# Patient Record
Sex: Male | Born: 1945
Health system: Southern US, Community
[De-identification: ages and names within clinical notes are randomized; demographics above are authoritative.]

## PROBLEM LIST (undated history)

## (undated) DIAGNOSIS — K8020 Calculus of gallbladder without cholecystitis without obstruction: Secondary | ICD-10-CM

## (undated) DIAGNOSIS — E785 Hyperlipidemia, unspecified: Secondary | ICD-10-CM

## (undated) DIAGNOSIS — I517 Cardiomegaly: Secondary | ICD-10-CM

## (undated) DIAGNOSIS — N183 Chronic kidney disease, stage 3 unspecified: Secondary | ICD-10-CM

## (undated) DIAGNOSIS — S7290XA Unspecified fracture of unspecified femur, initial encounter for closed fracture: Secondary | ICD-10-CM

## (undated) DIAGNOSIS — R9389 Abnormal findings on diagnostic imaging of other specified body structures: Secondary | ICD-10-CM

## (undated) DIAGNOSIS — A159 Respiratory tuberculosis unspecified: Secondary | ICD-10-CM

## (undated) DIAGNOSIS — J4489 Other specified chronic obstructive pulmonary disease: Secondary | ICD-10-CM

## (undated) DIAGNOSIS — Z111 Encounter for screening for respiratory tuberculosis: Secondary | ICD-10-CM

## (undated) DIAGNOSIS — E1129 Type 2 diabetes mellitus with other diabetic kidney complication: Secondary | ICD-10-CM

## (undated) DIAGNOSIS — N401 Enlarged prostate with lower urinary tract symptoms: Secondary | ICD-10-CM

## (undated) DIAGNOSIS — E669 Obesity, unspecified: Secondary | ICD-10-CM

## (undated) DIAGNOSIS — I10 Essential (primary) hypertension: Secondary | ICD-10-CM

## (undated) DIAGNOSIS — Z550 Illiteracy and low-level literacy: Secondary | ICD-10-CM

## (undated) DIAGNOSIS — Z20828 Contact with and (suspected) exposure to other viral communicable diseases: Secondary | ICD-10-CM

## (undated) DIAGNOSIS — E1165 Type 2 diabetes mellitus with hyperglycemia: Secondary | ICD-10-CM

## (undated) DIAGNOSIS — F17200 Nicotine dependence, unspecified, uncomplicated: Secondary | ICD-10-CM

## (undated) DIAGNOSIS — I639 Cerebral infarction, unspecified: Secondary | ICD-10-CM

## (undated) DIAGNOSIS — J449 Chronic obstructive pulmonary disease, unspecified: Secondary | ICD-10-CM

## (undated) HISTORY — DX: Cerebral infarction, unspecified: I63.9

## (undated) HISTORY — DX: Other specified chronic obstructive pulmonary disease: J44.89

## (undated) HISTORY — DX: Calculus of gallbladder without cholecystitis without obstruction: K80.20

## (undated) HISTORY — DX: Contact with and (suspected) exposure to other viral communicable diseases: Z20.828

## (undated) HISTORY — DX: Abnormal findings on diagnostic imaging of other specified body structures: R93.89

## (undated) HISTORY — DX: Illiteracy and low-level literacy: Z55.0

## (undated) HISTORY — DX: Encounter for screening for respiratory tuberculosis: Z11.1

## (undated) HISTORY — DX: Chronic kidney disease, stage 3 unspecified: N18.30

## (undated) HISTORY — DX: Chronic obstructive pulmonary disease, unspecified: J44.9

## (undated) HISTORY — DX: Essential (primary) hypertension: I10

## (undated) HISTORY — DX: Hyperlipidemia, unspecified: E78.5

## (undated) HISTORY — DX: Unspecified fracture of unspecified femur, initial encounter for closed fracture: S72.90XA

## (undated) HISTORY — DX: Respiratory tuberculosis unspecified: A15.9

## (undated) HISTORY — DX: Benign prostatic hyperplasia with lower urinary tract symptoms: N40.1

## (undated) HISTORY — DX: Type 2 diabetes mellitus with hyperglycemia: E11.29

## (undated) HISTORY — DX: Obesity, unspecified: E66.9

## (undated) HISTORY — DX: Type 2 diabetes mellitus with other diabetic kidney complication: E11.65

## (undated) HISTORY — DX: Chronic kidney disease, stage 3 (moderate): N18.3

## (undated) HISTORY — DX: Cardiomegaly: I51.7

## (undated) HISTORY — DX: Type 2 diabetes mellitus with other diabetic kidney complication: E11.29

## (undated) HISTORY — DX: Nicotine dependence, unspecified, uncomplicated: F17.200

---
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
## 2007-10-06 DIAGNOSIS — R9389 Abnormal findings on diagnostic imaging of other specified body structures: Secondary | ICD-10-CM

## 2007-10-06 DIAGNOSIS — I639 Cerebral infarction, unspecified: Secondary | ICD-10-CM

## 2007-10-06 HISTORY — DX: Abnormal findings on diagnostic imaging of other specified body structures: R93.89

## 2007-10-06 HISTORY — DX: Cerebral infarction, unspecified: I63.9

## 2008-02-16 ENCOUNTER — Ambulatory Visit: Payer: Self-pay | Admitting: Internal Medicine

## 2008-02-16 ENCOUNTER — Inpatient Hospital Stay (HOSPITAL_COMMUNITY): Admission: EM | Admit: 2008-02-16 | Discharge: 2008-02-18 | Payer: Self-pay | Admitting: Family Medicine

## 2008-02-17 ENCOUNTER — Encounter (INDEPENDENT_AMBULATORY_CARE_PROVIDER_SITE_OTHER): Payer: Self-pay | Admitting: *Deleted

## 2008-02-28 ENCOUNTER — Ambulatory Visit: Payer: Self-pay | Admitting: Family Medicine

## 2008-02-28 ENCOUNTER — Ambulatory Visit: Payer: Self-pay | Admitting: *Deleted

## 2008-03-15 ENCOUNTER — Ambulatory Visit: Payer: Self-pay | Admitting: Family Medicine

## 2008-03-15 ENCOUNTER — Encounter (INDEPENDENT_AMBULATORY_CARE_PROVIDER_SITE_OTHER): Payer: Self-pay | Admitting: Nurse Practitioner

## 2008-03-15 LAB — CONVERTED CEMR LAB
Albumin: 4.3 g/dL (ref 3.5–5.2)
Alkaline Phosphatase: 70 units/L (ref 39–117)
BUN: 36 mg/dL — ABNORMAL HIGH (ref 6–23)
CO2: 19 meq/L (ref 19–32)
Calcium: 9.2 mg/dL (ref 8.4–10.5)
Chloride: 103 meq/L (ref 96–112)
Glucose, Bld: 70 mg/dL (ref 70–99)
Potassium: 4.6 meq/L (ref 3.5–5.3)
Sodium: 139 meq/L (ref 135–145)
Total Protein: 7.6 g/dL (ref 6.0–8.3)

## 2008-03-20 ENCOUNTER — Ambulatory Visit (HOSPITAL_COMMUNITY): Admission: RE | Admit: 2008-03-20 | Discharge: 2008-03-20 | Payer: Self-pay | Admitting: Family Medicine

## 2008-05-29 ENCOUNTER — Ambulatory Visit: Payer: Self-pay | Admitting: Internal Medicine

## 2008-06-06 ENCOUNTER — Ambulatory Visit: Payer: Self-pay | Admitting: Internal Medicine

## 2008-06-12 ENCOUNTER — Ambulatory Visit (HOSPITAL_COMMUNITY): Admission: RE | Admit: 2008-06-12 | Discharge: 2008-06-12 | Payer: Self-pay | Admitting: Internal Medicine

## 2008-07-25 ENCOUNTER — Ambulatory Visit: Payer: Self-pay | Admitting: Internal Medicine

## 2008-07-25 ENCOUNTER — Encounter (INDEPENDENT_AMBULATORY_CARE_PROVIDER_SITE_OTHER): Payer: Self-pay | Admitting: Internal Medicine

## 2008-07-25 LAB — CONVERTED CEMR LAB
ALT: 18 units/L (ref 0–53)
AST: 22 units/L (ref 0–37)
Albumin: 4.6 g/dL (ref 3.5–5.2)
Amphetamine Screen, Ur: NEGATIVE
BUN: 30 mg/dL — ABNORMAL HIGH (ref 6–23)
Barbiturate Quant, Ur: NEGATIVE
Basophils Relative: 2 % — ABNORMAL HIGH (ref 0–1)
CO2: 22 meq/L (ref 19–32)
Calcium: 9.3 mg/dL (ref 8.4–10.5)
Chloride: 105 meq/L (ref 96–112)
Creatinine, Ser: 1.39 mg/dL (ref 0.40–1.50)
Hemoglobin: 14.8 g/dL (ref 13.0–17.0)
Lymphocytes Relative: 35 % (ref 12–46)
MCHC: 33 g/dL (ref 30.0–36.0)
Marijuana Metabolite: POSITIVE — AB
Methadone: NEGATIVE
Monocytes Absolute: 0.7 10*3/uL (ref 0.1–1.0)
Monocytes Relative: 9 % (ref 3–12)
Neutro Abs: 3.7 10*3/uL (ref 1.7–7.7)
Neutrophils Relative %: 46 % (ref 43–77)
Potassium: 4.3 meq/L (ref 3.5–5.3)
Propoxyphene: NEGATIVE
RBC: 4.89 M/uL (ref 4.22–5.81)
TSH: 1.188 microintl units/mL (ref 0.350–4.50)
WBC: 7.9 10*3/uL (ref 4.0–10.5)

## 2008-07-27 ENCOUNTER — Ambulatory Visit (HOSPITAL_COMMUNITY): Admission: RE | Admit: 2008-07-27 | Discharge: 2008-07-27 | Payer: Self-pay | Admitting: Internal Medicine

## 2008-08-08 ENCOUNTER — Ambulatory Visit: Payer: Self-pay | Admitting: Internal Medicine

## 2008-08-27 ENCOUNTER — Ambulatory Visit: Payer: Self-pay | Admitting: Internal Medicine

## 2008-08-28 ENCOUNTER — Ambulatory Visit: Payer: Self-pay | Admitting: Family Medicine

## 2008-09-06 ENCOUNTER — Ambulatory Visit (HOSPITAL_COMMUNITY): Admission: RE | Admit: 2008-09-06 | Discharge: 2008-09-06 | Payer: Self-pay | Admitting: Internal Medicine

## 2008-09-10 ENCOUNTER — Ambulatory Visit: Payer: Self-pay | Admitting: Internal Medicine

## 2008-09-19 ENCOUNTER — Ambulatory Visit: Payer: Self-pay | Admitting: Internal Medicine

## 2008-10-09 ENCOUNTER — Ambulatory Visit: Payer: Self-pay | Admitting: Internal Medicine

## 2008-10-10 ENCOUNTER — Ambulatory Visit: Payer: Self-pay | Admitting: Family Medicine

## 2008-11-12 ENCOUNTER — Ambulatory Visit: Payer: Self-pay | Admitting: Internal Medicine

## 2009-10-21 ENCOUNTER — Encounter: Admission: RE | Admit: 2009-10-21 | Discharge: 2009-10-21 | Payer: Self-pay | Admitting: Internal Medicine

## 2010-08-22 ENCOUNTER — Encounter: Admission: RE | Admit: 2010-08-22 | Discharge: 2010-08-22 | Payer: Self-pay | Admitting: Internal Medicine

## 2010-09-18 ENCOUNTER — Encounter
Admission: RE | Admit: 2010-09-18 | Discharge: 2010-09-18 | Payer: Self-pay | Source: Home / Self Care | Attending: Internal Medicine | Admitting: Internal Medicine

## 2010-10-26 ENCOUNTER — Encounter: Payer: Self-pay | Admitting: Internal Medicine

## 2011-02-17 NOTE — Discharge Summary (Signed)
NAME:  Clarence Maldonado, Clarence Maldonado NO.:  [id number]   MEDICAL RECORD NO.:  [id number]          PATIENT TYPE:  INP   LOCATION:  3702                         FACILITY:  [facility]   PHYSICIAN:  Alvester Morin, M.D.  DATE OF BIRTH:  [birth year]   DATE OF ADMISSION:  02/16/2008  DATE OF DISCHARGE:  02/18/2008                               DISCHARGE SUMMARY   DISCHARGE DIAGNOSES:  1. Hypertensive crisis with dyspnea.  2. Chronic obstructive pulmonary disease.  3. Tobacco abuse.  4. Alcohol abuse.  5. Hyperlipidemia.  6. Pulmonary opacities on chest CT.   DISCHARGE MEDICATIONS:  1. Pravachol 40 mg p.o. daily.  2. Aspirin 81 mg p.o. daily.  3. Norvasc 10 mg p.o. daily.  4. Hydrochlorothiazide 12.5 mg daily.   DISPOSITION:  The patient will follow up at the [facility].  His blood pressure medication will be checked.  Also a BMET  will be checked to follow up with his potassium and his creatinine on a  diuretic.  Also, we will have him see Marcelle Smiling to help him with his  medication. He will also need to have a repeat chest CT in 3-4 months  (see below).   PROCEDURES PERFORMED:  1. 2D echo that showed an ejection fraction of 55-65%, a wall motion      abnormality, mild LVH, and wall thickness increased.  2. CT angio of the chest was negative for PE. However, there were      multiple pulmonary nodular opacities and an ill-defined nodular-      like density in the posteromedial aspect of the right upper lobe.      There was also mediastinal and right hilar adenopathy.   CONSULTANTS:  None.   BRIEF ADMITTING HISTORY AND PHYSICAL:  Mr Campion is a 65 year old man  with past medical history of hypertension, tobacco abuse, and obesity  who comes in because of increased leg swelling and shortness of breath  for the past 64 weeks.  He related 2 months before this, he had no  symptoms of shortness of breath and could walk 2-3 miles without getting  short of breath.  He  can lie flat at night and has been using 3-4  pillows.  He relates no cough, no fever, and no chills.  No nausea,  vomiting, or diarrhea.  He also relates some right-sided chest pain for  2 weeks, like numbness with no radiation.  It is not reproducible by  palpation.  Nothing makes it better and nothing makes it worse.   PHYSICAL EXAMINATION:  VITAL SIGNS:  On the day of admission, his  temperature was 98.1, heart rate was 105, blood pressure was 122/45, and  oxygen saturation was 95% on room air.  GENERAL:  The patient appears in no acute distress.  NECK:  He has positive JVD about 10 cm.  No bruits.  He has been using  accessory muscles.  CARDIOVASCULAR:  He is tachycardic distance with a regular rate and  rhythm.  Normal S1 and S2.  No murmurs, rubs, or gallops.  LUNGS:  He has good air  movement with diffuse wheezing, but no crackles  heard.  EXTREMITIES:  He has positive pulses, 2+ edema, and some perifollicular  hemorrhage.  NEUROLOGIC:  Nonfocal.   LABORATORY DATA:  Admission labs: white blood cells 9.8, hemoglobin  14.9, and platelets 151.  Sodium 140, potassium 4.0, chloride 104,  bicarb 28, BUN 15, creatinine 1.2, glucose 117, bilirubin 0.8, alkaline  phosphatase 58, AST 26, ALT 23, total protein 5.9, albumin 3.2, and  calcium 8.9.  BNP was 793.  D-dimer was 1.11.  Point of cardiac care  markers were negative.  EKG show a sinus tachycardia with a ST-segment  elevation in V1 and V3 with possibly J-point elevation.  No T-wave  abnormality and some left ventricular hypertrophy.   HOSPITAL COURSE:  1. Hypertensive crisis and shortness of breath.  The patient was      admitted to the telemetry unit.  No arrhythmias were seen during      his hospital stay.  CT angio was negative for PE.  The patient was      gently diuresed.  His blood pressure dropped 25% on the first 24      hours.  His systolic pressure ranged between the 160s and 180s.      Cardiac enzymes were negative.   He was discharged on HCTZ and      Norvasc, these will be titrated as an out patient.  2. COPD exacerbation.  This is most likely secondary to smoking.  PFTs      will be done as an outpatient.  The patient has a history of      smoking for more than 40 years.  He was discharged on albuterol      MDI. Other medications may be started as an outpatient.  3. Tobacco abuse.  The patient was counseled at length on smoking      cessation. He states he will attempt to quit.  4. Pulmonary nodular opacities. The patient had a chest CT that showed      multiple pulmonary nodular opacities as well as mediastinal and      right hilar adenopathy. There was also an ill-defined nodular      density in the posteromedial aspect ot the right upper lobe. It was      felt that this could be inflammatory but tumor could not be ruled      out. A CT scan in 6 months was recommended. We will arrange for the      patient to get this follow up in 3-4 months.  5. Social situation. At this time, the patient is unemployed.  We will      schedule him an appointment with Marcelle Smiling in [facility]      to try to help him with his medication.   On the day of discharge, his vitals were; temperature 98.9, pulse 92,  respirations 18, and blood pressure 161/108.  Sodium 139, potassium 4.0,  chloride 95, bicarb of 30, glucose of 93, BUN 16, creatinine 1.1, and  calcium 8.9.  White blood cell 8.7, hemoglobin 16.7, and platelets 165.  Fasting lipid panel showed a cholesterol of 135, triglycerides 275, HDL  27, LDL 93, and VLDL 15.      Marinda Elk, M.D.  Electronically Signed      Alvester Morin, M.D.  Electronically Signed    AF/MEDQ  D:  02/18/2008  T:  02/18/2008  Job:  [id number]

## 2011-07-01 LAB — BASIC METABOLIC PANEL
BUN: 16
CO2: 30
CO2: 32
Calcium: 8.3 — ABNORMAL LOW
Chloride: 95 — ABNORMAL LOW
Creatinine, Ser: 1.29
GFR calc non Af Amer: 57 — ABNORMAL LOW
Glucose, Bld: 100 — ABNORMAL HIGH
Glucose, Bld: 93
Potassium: 4

## 2011-07-01 LAB — CBC
HCT: 49.8
MCHC: 33.6
MCHC: 33.7
MCV: 89
Platelets: 158
Platelets: 165
RDW: 14.9
WBC: 8.7

## 2011-07-01 LAB — LIPID PANEL
Cholesterol: 135
LDL Cholesterol: 93
VLDL: 15

## 2011-09-07 ENCOUNTER — Other Ambulatory Visit (HOSPITAL_BASED_OUTPATIENT_CLINIC_OR_DEPARTMENT_OTHER): Payer: Self-pay | Admitting: Internal Medicine

## 2011-09-07 ENCOUNTER — Ambulatory Visit
Admission: RE | Admit: 2011-09-07 | Discharge: 2011-09-07 | Disposition: A | Discharge status: Home / Self Care | Payer: Medicare Other | Source: Ambulatory Visit | Attending: Internal Medicine | Admitting: Internal Medicine

## 2011-09-07 DIAGNOSIS — J984 Other disorders of lung: Secondary | ICD-10-CM

## 2012-01-06 DIAGNOSIS — E785 Hyperlipidemia, unspecified: Secondary | ICD-10-CM | POA: Diagnosis not present

## 2012-01-11 DIAGNOSIS — I10 Essential (primary) hypertension: Secondary | ICD-10-CM | POA: Diagnosis not present

## 2012-01-11 DIAGNOSIS — E785 Hyperlipidemia, unspecified: Secondary | ICD-10-CM | POA: Diagnosis not present

## 2012-01-11 DIAGNOSIS — Z23 Encounter for immunization: Secondary | ICD-10-CM | POA: Diagnosis not present

## 2012-03-07 DIAGNOSIS — I10 Essential (primary) hypertension: Secondary | ICD-10-CM | POA: Diagnosis not present

## 2012-03-07 DIAGNOSIS — E1129 Type 2 diabetes mellitus with other diabetic kidney complication: Secondary | ICD-10-CM | POA: Diagnosis not present

## 2012-06-13 DIAGNOSIS — R634 Abnormal weight loss: Secondary | ICD-10-CM | POA: Diagnosis not present

## 2012-06-13 DIAGNOSIS — J449 Chronic obstructive pulmonary disease, unspecified: Secondary | ICD-10-CM | POA: Diagnosis not present

## 2012-07-18 DIAGNOSIS — I10 Essential (primary) hypertension: Secondary | ICD-10-CM | POA: Diagnosis not present

## 2012-07-18 DIAGNOSIS — N189 Chronic kidney disease, unspecified: Secondary | ICD-10-CM | POA: Diagnosis not present

## 2012-08-22 DIAGNOSIS — I10 Essential (primary) hypertension: Secondary | ICD-10-CM | POA: Diagnosis not present

## 2013-01-04 ENCOUNTER — Encounter: Payer: Self-pay | Admitting: Internal Medicine

## 2013-01-04 ENCOUNTER — Ambulatory Visit (INDEPENDENT_AMBULATORY_CARE_PROVIDER_SITE_OTHER): Payer: Medicare Other | Admitting: Internal Medicine

## 2013-01-04 VITALS — BP 120/82 | HR 90 | Temp 98.0°F | Resp 20 | Wt 198.0 lb

## 2013-01-04 DIAGNOSIS — F17200 Nicotine dependence, unspecified, uncomplicated: Secondary | ICD-10-CM | POA: Diagnosis not present

## 2013-01-04 DIAGNOSIS — E1129 Type 2 diabetes mellitus with other diabetic kidney complication: Secondary | ICD-10-CM

## 2013-01-04 DIAGNOSIS — N401 Enlarged prostate with lower urinary tract symptoms: Secondary | ICD-10-CM | POA: Diagnosis not present

## 2013-01-04 DIAGNOSIS — E1165 Type 2 diabetes mellitus with hyperglycemia: Secondary | ICD-10-CM

## 2013-01-04 DIAGNOSIS — N400 Enlarged prostate without lower urinary tract symptoms: Secondary | ICD-10-CM | POA: Insufficient documentation

## 2013-01-04 DIAGNOSIS — R300 Dysuria: Secondary | ICD-10-CM

## 2013-01-04 DIAGNOSIS — I10 Essential (primary) hypertension: Secondary | ICD-10-CM | POA: Diagnosis not present

## 2013-01-04 DIAGNOSIS — N183 Chronic kidney disease, stage 3 unspecified: Secondary | ICD-10-CM

## 2013-01-04 DIAGNOSIS — E785 Hyperlipidemia, unspecified: Secondary | ICD-10-CM | POA: Insufficient documentation

## 2013-01-04 NOTE — Patient Instructions (Signed)
Continue current medications. 

## 2013-01-05 ENCOUNTER — Encounter: Payer: Self-pay | Admitting: Internal Medicine

## 2013-01-05 ENCOUNTER — Telehealth: Payer: Self-pay | Admitting: *Deleted

## 2013-01-05 DIAGNOSIS — N183 Chronic kidney disease, stage 3 unspecified: Secondary | ICD-10-CM | POA: Insufficient documentation

## 2013-01-05 DIAGNOSIS — E1122 Type 2 diabetes mellitus with diabetic chronic kidney disease: Secondary | ICD-10-CM | POA: Insufficient documentation

## 2013-01-05 DIAGNOSIS — R300 Dysuria: Secondary | ICD-10-CM | POA: Insufficient documentation

## 2013-01-05 LAB — LIPID PANEL
Chol/HDL Ratio: 6.6 ratio units — ABNORMAL HIGH (ref 0.0–5.0)
Cholesterol, Total: 179 mg/dL (ref 100–199)
LDL Calculated: 87 mg/dL (ref 0–99)

## 2013-01-05 LAB — MICROALBUMIN / CREATININE URINE RATIO
Creatinine, Ur: 212.8 mg/dL (ref 22.0–328.0)
Microalbumin, Urine: 8.9 ug/mL (ref 0.0–17.0)

## 2013-01-05 LAB — COMPREHENSIVE METABOLIC PANEL
ALT: 19 IU/L (ref 0–44)
AST: 21 IU/L (ref 0–40)
Albumin/Globulin Ratio: 1.4 (ref 1.1–2.5)
Calcium: 10 mg/dL (ref 8.6–10.2)
GFR calc non Af Amer: 31 mL/min/{1.73_m2} — ABNORMAL LOW (ref >59)
Potassium: 4.4 mmol/L (ref 3.5–5.2)
Sodium: 140 mmol/L (ref 134–144)
Total Bilirubin: 0.2 mg/dL (ref 0.0–1.2)

## 2013-01-05 NOTE — Progress Notes (Signed)
Subjective:     Patient ID: Clarence Maldonado, male   DOB: [birth year], 67 y.o.   MRN: [id number]  HPI Patient reports today for general review of multiple medical problems. Is diabetic, hypertensive, has renal insufficiency, chronic obstructive pulmonary disease, hyperlipidemia, and is a persistent smoker. He is not likely to quit smoking, although he has cut back from nearly 3 packs per day to about one half pack per day. He has no real complaints today.  Patient is illiterate and requires the assistance of others to keep track of his mail, medications, and other items. He does drive. He has had a license since he was a teenager and it has no restrictions on it. He lives in a trailer behind a friend's house.  Review of medical records indicates that he has had a number of CT chest scans due to the abnormalities of a nodule. This seemed to be unchanging with the last scan and he probably does not need further CT scans unless evidence is seen of changes on her plain x-ray or in symptomatology. He is not losing weight. He does have a chronic cough related to his smoking history. He has had no significant increase in sputum. He denies hemoptysis.  Past procedures 5 /15/2009 2-D echocardiogram: LVEF 55-65%. Aortic valve thickness mildly increased. Trivial aortic valvular regurgitation. Mild ascending aortic dilatation. Mild mitral valve regurgitation. 09/07/11 CT chest without contrast: This was compared to CT of the chest of 09/18/10, 06/12/08, and 02/16/08. There is a groundglass opacity within the posterior right upper lobe which has not changed. A nodular component is posterior lesion within this groundglass opacity and it is slightly larger measuring 9 mm in diameter compared to proximally 6 mm previously. It remains very worrisome for low-grade adenocarcinoma. PET scan should be considered. Probable small  gallstones in the gallbladder. 08/22/10 Renal ultrasound: Mild bilateral renal cortical thinning. No  acute findings.   10/21/09 CXR: Stable chronic changes with no active pulmonary disease   Review of Systems  Constitutional: Negative for fever, chills, weight loss, malaise/fatigue and diaphoresis.  HENT: Positive for hearing loss. Negative for ear pain, nosebleeds, congestion, sore throat, neck pain, tinnitus and ear discharge.   Eyes: Negative for blurred vision, double vision, photophobia, pain and discharge.  Respiratory: Positive for cough, shortness of breath and wheezing. Negative for hemoptysis, sputum production and stridor.   Cardiovascular: Negative for chest pain, palpitations, orthopnea, claudication, leg swelling and PND.  Gastrointestinal: Negative for heartburn, vomiting, abdominal pain, diarrhea, constipation, blood in stool and melena.  Endocrine: Negative for polydipsia.  Genitourinary: Negative for dysuria, urgency, frequency, hematuria and flank pain.       Diminished urinary stream and slow to initiate voiding.  Musculoskeletal: Negative for myalgias, back pain, joint pain and falls.  Skin: Negative for itching and rash.  Allergic/Immunologic: Negative for environmental allergies.  Neurological: Negative for dizziness, tingling, tremors, sensory change, speech change, focal weakness, seizures, loss of consciousness, weakness and headaches.  Hematological: Does not bruise/bleed easily.  Psychiatric/Behavioral: Positive for depression. Negative for suicidal ideas, hallucinations, memory loss and substance abuse. The patient is not nervous/anxious and does not have insomnia.    Review of Systems  Constitutional: Negative for fever, chills, weight loss, malaise/fatigue and diaphoresis.  HENT: Positive for hearing loss. Negative for ear pain, nosebleeds, congestion, sore throat, neck pain, tinnitus and ear discharge.   Eyes: Negative for blurred vision, double vision, photophobia, pain and discharge.  Respiratory: Positive for cough, shortness of breath and wheezing.  Negative  for hemoptysis, sputum production and stridor.   Cardiovascular: Negative for chest pain, palpitations, orthopnea, claudication, leg swelling and PND.  Gastrointestinal: Negative for heartburn, vomiting, abdominal pain, diarrhea, constipation, blood in stool and melena.  Genitourinary: Negative for dysuria, urgency, frequency, hematuria and flank pain.       Diminished urinary stream and slow to initiate voiding.  Musculoskeletal: Negative for myalgias, back pain, joint pain and falls.  Skin: Negative for itching and rash.  Neurological: Negative for dizziness, tingling, tremors, sensory change, speech change, focal weakness, seizures, loss of consciousness, weakness and headaches.  Endo/Heme/Allergies: Negative for environmental allergies and polydipsia. Does not bruise/bleed easily.  Psychiatric/Behavioral: Positive for depression. Negative for suicidal ideas, hallucinations, memory loss and substance abuse. The patient is not nervous/anxious and does not have insomnia.       Objective:   Physical Exam  Constitutional: He is oriented to person, place, and time. No distress.  Truncal obesity  HENT:  Head: Normocephalic and atraumatic.  Right Ear: External ear normal.  Left Ear: External ear normal.  Nose: Nose normal.  Mouth/Throat: Oropharynx is clear and moist.  Bilateral hearing loss  Eyes: Conjunctivae and EOM are normal. Pupils are equal, round, and reactive to light. Right eye exhibits no discharge. Left eye exhibits no discharge. No scleral icterus.  Wears prescription lenses  Neck: Neck supple. No JVD present. No tracheal deviation present. No thyromegaly present.  Cardiovascular: Normal rate, regular rhythm, normal heart sounds and intact distal pulses.  Exam reveals no gallop and no friction rub.   No murmur heard. Pulmonary/Chest: Effort normal. No respiratory distress. He has no wheezes. He has rales. He exhibits no tenderness.  Abdominal: Soft. Bowel sounds are  normal. He exhibits no distension and no mass. There is no tenderness. There is no rebound and no guarding.  Genitourinary: Rectum normal, prostate normal and penis normal.  Musculoskeletal: Normal range of motion. He exhibits no edema and no tenderness.  Lymphadenopathy:    He has no cervical adenopathy.  Neurological: He is alert and oriented to person, place, and time. He has normal reflexes. No cranial nerve deficit. Coordination normal.  Skin: Skin is warm and dry. He is not diaphoretic.  Psychiatric: He has a normal mood and affect. His behavior is normal. Judgment and thought content normal.       Assessment:     Stable.     Plan:     Continue current medications . Stop smoking.

## 2013-01-05 NOTE — Telephone Encounter (Signed)
Message copied by Oleh Genin on Thu Jan 05, 2013  1:22 PM ------      Message from: Kimber Relic      Created: Thu Jan 05, 2013  9:43 AM      Regarding: lab repoprt       Results of lab are all acceptable ------

## 2013-01-05 NOTE — Telephone Encounter (Signed)
LMOM to return call.

## 2013-01-06 ENCOUNTER — Telehealth: Payer: Self-pay | Admitting: *Deleted

## 2013-01-06 NOTE — Telephone Encounter (Signed)
Message copied by Oleh Genin on Fri Jan 06, 2013  3:30 PM ------      Message from: Kimber Relic      Created: Thu Jan 05, 2013  9:43 AM      Regarding: lab repoprt       Results of lab are all acceptable ------

## 2013-01-06 NOTE — Telephone Encounter (Signed)
Patient Notified

## 2013-01-10 NOTE — Telephone Encounter (Signed)
Patient Notified of results.

## 2013-02-11 ENCOUNTER — Other Ambulatory Visit: Payer: Self-pay | Admitting: Internal Medicine

## 2013-04-11 ENCOUNTER — Encounter: Payer: Self-pay | Admitting: *Deleted

## 2013-04-12 ENCOUNTER — Ambulatory Visit: Payer: Medicare Other | Admitting: Internal Medicine

## 2013-05-22 ENCOUNTER — Other Ambulatory Visit: Payer: Self-pay | Admitting: Internal Medicine

## 2013-06-21 ENCOUNTER — Other Ambulatory Visit: Payer: Self-pay | Admitting: Internal Medicine

## 2013-10-06 ENCOUNTER — Other Ambulatory Visit: Payer: Self-pay | Admitting: *Deleted

## 2013-10-06 MED ORDER — FLUTICASONE-SALMETEROL 250-50 MCG/DOSE IN AEPB: INHALATION_SPRAY | RESPIRATORY_TRACT | Status: DC

## 2013-10-06 MED ORDER — LOVASTATIN 20 MG PO TABS: ORAL_TABLET | ORAL | Status: DC

## 2013-10-06 MED ORDER — GLIMEPIRIDE 1 MG PO TABS: ORAL_TABLET | ORAL | Status: DC

## 2013-10-06 MED ORDER — ALBUTEROL SULFATE HFA 108 (90 BASE) MCG/ACT IN AERS: INHALATION_SPRAY | RESPIRATORY_TRACT | Status: DC

## 2013-10-06 MED ORDER — AMLODIPINE BESYLATE 10 MG PO TABS: ORAL_TABLET | ORAL | Status: DC

## 2013-10-06 MED ORDER — METOPROLOL SUCCINATE ER 25 MG PO TB24: ORAL_TABLET | ORAL | Status: DC

## 2013-10-06 MED ORDER — LISINOPRIL-HYDROCHLOROTHIAZIDE 20-25 MG PO TABS: ORAL_TABLET | ORAL | Status: DC

## 2013-12-05 ENCOUNTER — Ambulatory Visit: Payer: Medicare Other | Admitting: Internal Medicine

## 2013-12-29 ENCOUNTER — Other Ambulatory Visit: Payer: Medicare Other

## 2013-12-29 ENCOUNTER — Other Ambulatory Visit: Payer: Self-pay | Admitting: Internal Medicine

## 2013-12-29 DIAGNOSIS — E785 Hyperlipidemia, unspecified: Secondary | ICD-10-CM

## 2013-12-29 DIAGNOSIS — T887XXA Unspecified adverse effect of drug or medicament, initial encounter: Secondary | ICD-10-CM

## 2013-12-29 DIAGNOSIS — E1165 Type 2 diabetes mellitus with hyperglycemia: Secondary | ICD-10-CM

## 2013-12-29 DIAGNOSIS — I10 Essential (primary) hypertension: Secondary | ICD-10-CM

## 2013-12-29 DIAGNOSIS — E1129 Type 2 diabetes mellitus with other diabetic kidney complication: Secondary | ICD-10-CM

## 2013-12-29 DIAGNOSIS — N401 Enlarged prostate with lower urinary tract symptoms: Secondary | ICD-10-CM | POA: Diagnosis not present

## 2013-12-30 LAB — COMPREHENSIVE METABOLIC PANEL
ALT: 30 IU/L (ref 0–44)
AST: 25 IU/L (ref 0–40)
Albumin/Globulin Ratio: 1.9 (ref 1.1–2.5)
Albumin: 4.6 g/dL (ref 3.6–4.8)
Alkaline Phosphatase: 72 IU/L (ref 39–117)
BILIRUBIN TOTAL: 0.4 mg/dL (ref 0.0–1.2)
BUN/Creatinine Ratio: 15 (ref 10–22)
BUN: 28 mg/dL — AB (ref 8–27)
CO2: 22 mmol/L (ref 18–29)
Calcium: 9.7 mg/dL (ref 8.6–10.2)
Chloride: 99 mmol/L (ref 97–108)
Creatinine, Ser: 1.81 mg/dL — ABNORMAL HIGH (ref 0.76–1.27)
GFR, EST AFRICAN AMERICAN: 44 mL/min/{1.73_m2} — AB (ref >59)
GFR, EST NON AFRICAN AMERICAN: 38 mL/min/{1.73_m2} — AB (ref >59)
GLOBULIN, TOTAL: 2.4 g/dL (ref 1.5–4.5)
GLUCOSE: 89 mg/dL (ref 65–99)
POTASSIUM: 4.5 mmol/L (ref 3.5–5.2)
Sodium: 139 mmol/L (ref 134–144)
Total Protein: 7 g/dL (ref 6.0–8.5)

## 2013-12-30 LAB — LIPID PANEL
Chol/HDL Ratio: 5.8 ratio units — ABNORMAL HIGH (ref 0.0–5.0)
Cholesterol, Total: 179 mg/dL (ref 100–199)
HDL: 31 mg/dL — AB (ref >39)
LDL Calculated: 92 mg/dL (ref 0–99)
Triglycerides: 281 mg/dL — ABNORMAL HIGH (ref 0–149)
VLDL Cholesterol Cal: 56 mg/dL — ABNORMAL HIGH (ref 5–40)

## 2013-12-30 LAB — PSA: PSA: 0.8 ng/mL (ref 0.0–4.0)

## 2013-12-30 LAB — MICROALBUMIN / CREATININE URINE RATIO
Creatinine, Ur: 68.3 mg/dL (ref 22.0–328.0)
MICROALB/CREAT RATIO: 59.2 mg/g{creat} — AB (ref 0.0–30.0)
Microalbumin, Urine: 40.4 ug/mL — ABNORMAL HIGH (ref 0.0–17.0)

## 2013-12-30 LAB — HEMOGLOBIN A1C
Est. average glucose Bld gHb Est-mCnc: 157 mg/dL
Hgb A1c MFr Bld: 7.1 % — ABNORMAL HIGH (ref 4.8–5.6)

## 2014-01-03 ENCOUNTER — Ambulatory Visit (INDEPENDENT_AMBULATORY_CARE_PROVIDER_SITE_OTHER): Payer: Medicare Other | Admitting: Internal Medicine

## 2014-01-03 ENCOUNTER — Encounter: Payer: Self-pay | Admitting: Internal Medicine

## 2014-01-03 VITALS — BP 120/80 | HR 83 | Temp 98.2°F | Resp 20 | Ht 68.0 in | Wt 208.4 lb

## 2014-01-03 DIAGNOSIS — Z550 Illiteracy and low-level literacy: Secondary | ICD-10-CM

## 2014-01-03 DIAGNOSIS — F17200 Nicotine dependence, unspecified, uncomplicated: Secondary | ICD-10-CM

## 2014-01-03 DIAGNOSIS — E1165 Type 2 diabetes mellitus with hyperglycemia: Principal | ICD-10-CM

## 2014-01-03 DIAGNOSIS — I10 Essential (primary) hypertension: Secondary | ICD-10-CM

## 2014-01-03 DIAGNOSIS — N183 Chronic kidney disease, stage 3 unspecified: Secondary | ICD-10-CM | POA: Diagnosis not present

## 2014-01-03 DIAGNOSIS — E785 Hyperlipidemia, unspecified: Secondary | ICD-10-CM | POA: Diagnosis not present

## 2014-01-03 DIAGNOSIS — E1129 Type 2 diabetes mellitus with other diabetic kidney complication: Secondary | ICD-10-CM

## 2014-01-03 DIAGNOSIS — Z559 Problems related to education and literacy, unspecified: Secondary | ICD-10-CM

## 2014-01-03 DIAGNOSIS — N401 Enlarged prostate with lower urinary tract symptoms: Secondary | ICD-10-CM

## 2014-01-03 DIAGNOSIS — R9389 Abnormal findings on diagnostic imaging of other specified body structures: Secondary | ICD-10-CM

## 2014-01-03 NOTE — Patient Instructions (Signed)
Continue current medications. 

## 2014-01-03 NOTE — Progress Notes (Signed)
Patient ID: Clarence Maldonado, male   DOB: Oct 20, 1945, 68 y.o.   MRN: [id number]    Location:    [facility]  Place of Service:  office  PCP: Kimber Relic, MD  Code Status: full  Extended Emergency Contact Information Primary Emergency Contact: Chriss Czar, [place] Home Phone: [phone] Relation: Other  No Known Allergies  Chief Complaint  Patient presents with  . Annual Exam    HPI:  Type II or unspecified type diabetes mellitus with renal manifestations, uncontrolled(250.42) - A1c 7.1; Fasting glucose 86. Adequately controlled  Hypertension: controlled  Other and unspecified hyperlipidemia - controlled  Chronic kidney disease, stage III (moderate): improved  Benign localized hyperplasia of prostate with urinary obstruction and other lower urinary tract symptoms (LUTS)(600.21): mild hesitation and nocturia x 2.  Tobacco use disorder: cutting down. Says he plans to quit.  Daily walks.  Patient is illiterate and requires the assistance of others to keep track of his mail, medications, and other items. He does drive. He has had a license since he was a teenager and it has no restrictions on it. He lives in a trailer behind a friend's house.   Review of medical records indicates that he has had a number of CT chest scans due to the abnormalities of a nodule. This seemed to be unchanging with the last scan and he probably does not need further CT scans unless evidence is seen of changes on her plain x-ray or in symptomatology. He is not losing weight. He does have a chronic cough related to his smoking history. He has had no significant increase in sputum. He denies hemoptysis.     Past Medical History  Diagnosis Date  . Type II or unspecified type diabetes mellitus with renal manifestations, uncontrolled   . Unspecified essential hypertension   . Obesity, unspecified   . Cardiomegaly   . Other and unspecified hyperlipidemia   .  Chronic kidney disease, stage III (moderate)   . Chronic airway obstruction, not elsewhere classified   . Illiterate   . Closed femur fracture 1960  . Cholelithiasis   . Abnormal CT of the chest 2009    nodule 9 x 6 mm RUL  . Cerebrovascular accident (stroke) 2009    lacunar infarct in the left thalamus  . Tobacco use disorder     Past Surgical History  Procedure Laterality Date  . Femur fracture surgery Left 1960    PAST PROCEDURES 5 /15/2009 2-D echocardiogram: LVEF 55-65%. Aortic valve thickness mildly increased. Trivial aortic valvular regurgitation. Mild ascending aortic dilatation. Mild mitral valve regurgitation. 10/21/2009 CXR: NAD. Stable chronic changes 08/22/10 CXR: right suprahilar opacity 08/22/10 US renal: bilateral cortical thinning 12/25/11Ct chest: minimally more prominent ground glass opacity in RUL with slight nodularity.  Gallstones. 09/07/11 CT chest: 1.  Slightly more prominent nodular component of the ground-glass opacity within  the posterior right upper lobe, suspicious for low grade adenocarcinoma.  Consider PET CT.  2.  No mediastinal or hilar adenopathy.  3.  Higher attenuation within the contracted gallbladder may represent small gallstones   Social History: History   Social History  . Marital Status: Single    Spouse Name: N/A    Number of Children: N/A  . Years of Education: N/A   Social History Main Topics  . Smoking status: Current Every Day Smoker -- 0.50 packs/day for 50 years    Types: Cigarettes  . Smokeless tobacco:  None  . Alcohol Use: No  . Drug Use: None  . Sexual Activity: None   Other Topics Concern  . None   Social History Narrative  . None    Family History Family Status  Relation Status Death Age  . Mother Deceased   . Father Deceased   . Sister Alive   . Brother Deceased   . Daughter Alive   . Brother Deceased   . Sister Alive    Family History  Problem Relation Age of Onset  . Heart disease Mother       Medications: Patient's Medications  New Prescriptions   No medications on file  Previous Medications   ALBUTEROL (PROAIR HFA) 108 (90 BASE) MCG/ACT INHALER    inhale 1 to 2 puffs by mouth every 4 to 6 hours if needed for BREATHING   AMLODIPINE (NORVASC) 10 MG TABLET    take 1 tablet by mouth once daily   FLUTICASONE-SALMETEROL (ADVAIR DISKUS) 250-50 MCG/DOSE AEPB    inhale 1 dose by mouth twice a day   GLIMEPIRIDE (AMARYL) 1 MG TABLET    take 1 tablet by mouth once daily for BLOOD SUGAR   LISINOPRIL-HYDROCHLOROTHIAZIDE (PRINZIDE,ZESTORETIC) 20-25 MG PER TABLET    take 1 tablet by mouth every morning   LOVASTATIN (MEVACOR) 20 MG TABLET    take 1 tablet by mouth once daily for cholesterol   METOPROLOL SUCCINATE (TOPROL-XL) 25 MG 24 HR TABLET    take 1/2 tablet by mouth once daily  Modified Medications   No medications on file  Discontinued Medications   No medications on file    Immunization History  Administered Date(s) Administered  . Influenza Split 06/22/2011  . Pneumococcal Polysaccharide-23 01/11/2012  . Tdap 01/11/2012     Review of Systems  Constitutional: Negative for fever, chills, weight loss, malaise/fatigue and diaphoresis.  HENT: Positive for hearing loss. Negative for congestion, ear discharge, ear pain, nosebleeds, sore throat and tinnitus.   Eyes: Negative for blurred vision, double vision, photophobia, pain and discharge.  Respiratory: Positive for cough, shortness of breath and wheezing. Negative for hemoptysis, sputum production and stridor.   Cardiovascular: Negative for chest pain, palpitations, orthopnea, claudication, leg swelling and PND.  Gastrointestinal: Negative for heartburn, vomiting, abdominal pain, diarrhea, constipation, blood in stool and melena.  Endocrine: Negative for polydipsia.  Genitourinary: Negative for dysuria, urgency, frequency, hematuria and flank pain.       Diminished urinary stream and slow to initiate voiding.   Musculoskeletal: Negative for back pain, falls, joint pain, myalgias and neck pain.  Skin: Negative for itching and rash.  Allergic/Immunologic: Negative for environmental allergies.  Neurological: Negative for dizziness, tingling, tremors, sensory change, speech change, focal weakness, seizures, loss of consciousness, weakness and headaches.  Hematological: Does not bruise/bleed easily.  Psychiatric/Behavioral: Positive for depression. Negative for suicidal ideas, hallucinations, memory loss and substance abuse. The patient is not nervous/anxious and does not have insomnia.       Filed Vitals:   01/03/14 1236  BP: 120/80  Pulse: 83  Temp: 98.2 F (36.8 C)  TempSrc: Oral  Resp: 20  Height: 5\' 8"  (1.727 m)  Weight: 208 lb 6.4 oz (94.53 kg)  SpO2: 95%   Physical Exam  Constitutional: He is oriented to person, place, and time. He appears well-nourished. No distress.  HENT:  Right Ear: External ear normal.  Left Ear: External ear normal.  Nose: Nose normal.  Mouth/Throat: Oropharynx is clear and moist. No oropharyngeal exudate.  Loss of hearing :  R>L.  Eyes:  Corrective lenses  Neck: No JVD present. No tracheal deviation present. No thyromegaly present.  Cardiovascular: Normal rate, normal heart sounds and intact distal pulses.  Exam reveals no gallop and no friction rub.   No murmur heard. Respiratory: No respiratory distress. He has no wheezes. He has no rales. He exhibits no tenderness.  GI: He exhibits no mass. There is no tenderness. There is no rebound.  Protuberant abd.  Genitourinary: Rectum normal, prostate normal and penis normal. Guaiac negative stool. No penile tenderness.  Musculoskeletal: He exhibits no edema and no tenderness.  Lymphadenopathy:    He has no cervical adenopathy.  Neurological: He is alert and oriented to person, place, and time. He has normal reflexes. No cranial nerve deficit. Coordination normal.  Skin: No rash noted. No erythema. No pallor.   Psychiatric: He has a normal mood and affect. His behavior is normal. Judgment and thought content normal.       Labs reviewed: Appointment on 12/29/2013  Component Date Value Ref Range Status  . Cholesterol, Total 12/29/2013 179  100 - 199 mg/dL Final  . Triglycerides 12/29/2013 281* 0 - 149 mg/dL Final  . HDL 34/91/7915 31* >39 mg/dL Final   Comment: According to ATP-III Guidelines, HDL-C >59 mg/dL is considered a                          negative risk factor for CHD.  Marland Kitchen VLDL Cholesterol Cal 12/29/2013 56* 5 - 40 mg/dL Final  . LDL Calculated 12/29/2013 92  0 - 99 mg/dL Final  . Chol/HDL Ratio 12/29/2013 5.8* 0.0 - 5.0 ratio units Final   Comment:                                   T. Chol/HDL Ratio                                                                      Men  Women                                                        1/2 Avg.Risk  3.4    3.3                                                            Avg.Risk  5.0    4.4                                                         2X Avg.Risk  9.6    7.1  3X Avg.Risk 23.4   11.0  . Glucose 12/29/2013 89  65 - 99 mg/dL Final  . BUN 82/95/621303/27/2015 28* 8 - 27 mg/dL Final  . Creatinine, Ser 12/29/2013 1.81* 0.76 - 1.27 mg/dL Final  . GFR calc non Af Amer 12/29/2013 38* >59 mL/min/1.73 Final  . GFR calc Af Amer 12/29/2013 44* >59 mL/min/1.73 Final  . BUN/Creatinine Ratio 12/29/2013 15  10 - 22 Final  . Sodium 12/29/2013 139  134 - 144 mmol/L Final  . Potassium 12/29/2013 4.5  3.5 - 5.2 mmol/L Final  . Chloride 12/29/2013 99  97 - 108 mmol/L Final  . CO2 12/29/2013 22  18 - 29 mmol/L Final  . Calcium 12/29/2013 9.7  8.6 - 10.2 mg/dL Final  . Total Protein 12/29/2013 7.0  6.0 - 8.5 g/dL Final  . Albumin 08/65/784603/27/2015 4.6  3.6 - 4.8 g/dL Final  . Globulin, Total 12/29/2013 2.4  1.5 - 4.5 g/dL Final  . Albumin/Globulin Ratio 12/29/2013 1.9  1.1 - 2.5 Final  . Total Bilirubin  12/29/2013 0.4  0.0 - 1.2 mg/dL Final  . Alkaline Phosphatase 12/29/2013 72  39 - 117 IU/L Final  . AST 12/29/2013 25  0 - 40 IU/L Final  . ALT 12/29/2013 30  0 - 44 IU/L Final  . Hemoglobin A1C 12/29/2013 7.1* 4.8 - 5.6 % Final   Comment:          Increased risk for diabetes: 5.7 - 6.4                                   Diabetes: >6.4                                   Glycemic control for adults with diabetes: <7.0  . Estimated average glucose 12/29/2013 157   Final  . Creatinine, Ur 12/29/2013 68.3  22.0 - 328.0 mg/dL Final  . Microalbum.,U,Random 12/29/2013 40.4* 0.0 - 17.0 ug/mL Final  . MICROALB/CREAT RATIO 12/29/2013 59.2* 0.0 - 30.0 mg/g creat Final  . PSA 12/29/2013 0.8  0.0 - 4.0 ng/mL Final   Comment: Roche ECLIA methodology.                          According to the American Urological Association, Serum PSA should                          decrease and remain at undetectable levels after radical                          prostatectomy. The AUA defines biochemical recurrence as an initial                          PSA value 0.2 ng/mL or greater followed by a subsequent confirmatory                          PSA value 0.2 ng/mL or greater.                          Values obtained with different assay methods or kits cannot be used  interchangeably. Results cannot be interpreted as absolute evidence                          of the presence or absence of malignant disease.   01/03/14 EKG: rate 83. NSR. Low limb lead voltage.  Assessment/Plan 1. Type II or unspecified type diabetes mellitus with renal manifestations, uncontrolled(250.42) Adequately controlled  2. Hypertension controlled  3. Other and unspecified hyperlipidemia controlled  4. Chronic kidney disease, stage III (moderate) improved  5. Benign localized hyperplasia of prostate with urinary obstruction and other lower urinary tract symptoms (LUTS)(600.21) unchanged  6. Tobacco use  disorder Stop smoking

## 2014-01-09 ENCOUNTER — Other Ambulatory Visit: Payer: Self-pay | Admitting: *Deleted

## 2014-01-09 DIAGNOSIS — Z1211 Encounter for screening for malignant neoplasm of colon: Secondary | ICD-10-CM

## 2014-01-10 ENCOUNTER — Encounter: Payer: Self-pay | Admitting: Gastroenterology

## 2014-01-12 ENCOUNTER — Encounter: Payer: Self-pay | Admitting: *Deleted

## 2014-01-22 ENCOUNTER — Encounter: Payer: Self-pay | Admitting: Internal Medicine

## 2014-02-12 ENCOUNTER — Telehealth: Payer: Self-pay

## 2014-02-12 MED ORDER — AMLODIPINE BESYLATE 10 MG PO TABS: ORAL_TABLET | ORAL | Status: DC

## 2014-02-12 MED ORDER — METOPROLOL SUCCINATE ER 25 MG PO TB24: ORAL_TABLET | ORAL | Status: DC

## 2014-02-12 MED ORDER — ALBUTEROL SULFATE HFA 108 (90 BASE) MCG/ACT IN AERS: INHALATION_SPRAY | RESPIRATORY_TRACT | Status: DC

## 2014-02-12 MED ORDER — LOVASTATIN 20 MG PO TABS: ORAL_TABLET | ORAL | Status: DC

## 2014-02-12 MED ORDER — LISINOPRIL-HYDROCHLOROTHIAZIDE 20-25 MG PO TABS: ORAL_TABLET | ORAL | Status: DC

## 2014-02-12 MED ORDER — FLUTICASONE-SALMETEROL 250-50 MCG/DOSE IN AEPB: INHALATION_SPRAY | RESPIRATORY_TRACT | Status: DC

## 2014-02-12 MED ORDER — GLIMEPIRIDE 1 MG PO TABS: ORAL_TABLET | ORAL | Status: DC

## 2014-02-12 NOTE — Telephone Encounter (Signed)
Patient's POA [name] stopped by the office indicating that they are not satisfied with service at [facility]. Patient would like all rx's sent to [facility], 30 day supply at a time.

## 2014-03-20 ENCOUNTER — Encounter: Payer: Medicare Other | Admitting: Gastroenterology

## 2014-09-01 ENCOUNTER — Other Ambulatory Visit: Payer: Self-pay | Admitting: Internal Medicine

## 2014-10-01 ENCOUNTER — Other Ambulatory Visit: Payer: Self-pay | Admitting: Internal Medicine

## 2014-11-02 ENCOUNTER — Other Ambulatory Visit: Payer: Self-pay | Admitting: Internal Medicine

## 2014-12-02 ENCOUNTER — Other Ambulatory Visit: Payer: Self-pay | Admitting: Internal Medicine

## 2014-12-14 ENCOUNTER — Other Ambulatory Visit: Payer: Self-pay | Admitting: Internal Medicine

## 2015-01-07 ENCOUNTER — Encounter: Payer: Medicare Other | Admitting: Internal Medicine

## 2015-01-07 ENCOUNTER — Other Ambulatory Visit: Payer: Medicare Other

## 2015-01-07 DIAGNOSIS — E1129 Type 2 diabetes mellitus with other diabetic kidney complication: Secondary | ICD-10-CM | POA: Diagnosis not present

## 2015-01-07 DIAGNOSIS — E1122 Type 2 diabetes mellitus with diabetic chronic kidney disease: Secondary | ICD-10-CM

## 2015-01-07 DIAGNOSIS — I10 Essential (primary) hypertension: Secondary | ICD-10-CM

## 2015-01-07 DIAGNOSIS — N400 Enlarged prostate without lower urinary tract symptoms: Secondary | ICD-10-CM

## 2015-01-07 DIAGNOSIS — E785 Hyperlipidemia, unspecified: Secondary | ICD-10-CM | POA: Diagnosis not present

## 2015-01-07 DIAGNOSIS — N189 Chronic kidney disease, unspecified: Secondary | ICD-10-CM | POA: Diagnosis not present

## 2015-01-08 LAB — COMPREHENSIVE METABOLIC PANEL
A/G RATIO: 1.8 (ref 1.1–2.5)
ALBUMIN: 4.6 g/dL (ref 3.6–4.8)
ALT: 19 IU/L (ref 0–44)
AST: 20 IU/L (ref 0–40)
Alkaline Phosphatase: 74 IU/L (ref 39–117)
BILIRUBIN TOTAL: 0.3 mg/dL (ref 0.0–1.2)
BUN/Creatinine Ratio: 15 (ref 10–22)
BUN: 33 mg/dL — ABNORMAL HIGH (ref 8–27)
CO2: 21 mmol/L (ref 18–29)
Calcium: 10 mg/dL (ref 8.6–10.2)
Chloride: 101 mmol/L (ref 97–108)
Creatinine, Ser: 2.23 mg/dL — ABNORMAL HIGH (ref 0.76–1.27)
GFR calc Af Amer: 34 mL/min/{1.73_m2} — ABNORMAL LOW (ref >59)
GFR calc non Af Amer: 29 mL/min/{1.73_m2} — ABNORMAL LOW (ref >59)
Globulin, Total: 2.6 g/dL (ref 1.5–4.5)
Glucose: 80 mg/dL (ref 65–99)
Potassium: 4.7 mmol/L (ref 3.5–5.2)
Sodium: 139 mmol/L (ref 134–144)
TOTAL PROTEIN: 7.2 g/dL (ref 6.0–8.5)

## 2015-01-08 LAB — CBC WITH DIFFERENTIAL/PLATELET
BASOS: 1 %
Basophils Absolute: 0.1 10*3/uL (ref 0.0–0.2)
Eos: 5 %
Eosinophils Absolute: 0.3 10*3/uL (ref 0.0–0.4)
HEMATOCRIT: 47.3 % (ref 37.5–51.0)
HEMOGLOBIN: 16.1 g/dL (ref 12.6–17.7)
Immature Grans (Abs): 0 10*3/uL (ref 0.0–0.1)
Immature Granulocytes: 0 %
Lymphocytes Absolute: 2.1 10*3/uL (ref 0.7–3.1)
Lymphs: 29 %
MCH: 29.7 pg (ref 26.6–33.0)
MCHC: 34 g/dL (ref 31.5–35.7)
MCV: 87 fL (ref 79–97)
MONOCYTES: 8 %
Monocytes Absolute: 0.6 10*3/uL (ref 0.1–0.9)
NEUTROS ABS: 4.1 10*3/uL (ref 1.4–7.0)
Neutrophils Relative %: 57 %
Platelets: 192 10*3/uL (ref 150–379)
RBC: 5.42 x10E6/uL (ref 4.14–5.80)
RDW: 14.9 % (ref 12.3–15.4)
WBC: 7.2 10*3/uL (ref 3.4–10.8)

## 2015-01-08 LAB — MICROALBUMIN / CREATININE URINE RATIO
CREATININE, UR: 127.2 mg/dL (ref 22.0–328.0)
MICROALB/CREAT RATIO: 5.3 mg/g creat (ref 0.0–30.0)
MICROALBUM., U, RANDOM: 6.7 ug/mL (ref 0.0–17.0)

## 2015-01-08 LAB — LIPID PANEL
CHOL/HDL RATIO: 4.7 ratio (ref 0.0–5.0)
Cholesterol, Total: 147 mg/dL (ref 100–199)
HDL: 31 mg/dL — ABNORMAL LOW (ref >39)
LDL Calculated: 65 mg/dL (ref 0–99)
TRIGLYCERIDES: 253 mg/dL — AB (ref 0–149)
VLDL Cholesterol Cal: 51 mg/dL — ABNORMAL HIGH (ref 5–40)

## 2015-01-08 LAB — HEMOGLOBIN A1C
ESTIMATED AVERAGE GLUCOSE: 160 mg/dL
HEMOGLOBIN A1C: 7.2 % — AB (ref 4.8–5.6)

## 2015-01-08 LAB — PSA: PSA: 0.7 ng/mL (ref 0.0–4.0)

## 2015-01-08 LAB — TSH: TSH: 1.53 u[IU]/mL (ref 0.450–4.500)

## 2015-01-09 ENCOUNTER — Encounter: Payer: Self-pay | Admitting: Internal Medicine

## 2015-01-09 ENCOUNTER — Ambulatory Visit (INDEPENDENT_AMBULATORY_CARE_PROVIDER_SITE_OTHER): Payer: Medicare Other | Admitting: Internal Medicine

## 2015-01-09 VITALS — BP 138/88 | HR 78 | Temp 97.9°F | Ht 68.0 in | Wt 209.2 lb

## 2015-01-09 DIAGNOSIS — IMO0002 Reserved for concepts with insufficient information to code with codable children: Secondary | ICD-10-CM

## 2015-01-09 DIAGNOSIS — Z720 Tobacco use: Secondary | ICD-10-CM

## 2015-01-09 DIAGNOSIS — N183 Chronic kidney disease, stage 3 unspecified: Secondary | ICD-10-CM

## 2015-01-09 DIAGNOSIS — E785 Hyperlipidemia, unspecified: Secondary | ICD-10-CM | POA: Diagnosis not present

## 2015-01-09 DIAGNOSIS — R9389 Abnormal findings on diagnostic imaging of other specified body structures: Secondary | ICD-10-CM

## 2015-01-09 DIAGNOSIS — E1165 Type 2 diabetes mellitus with hyperglycemia: Secondary | ICD-10-CM

## 2015-01-09 DIAGNOSIS — E1129 Type 2 diabetes mellitus with other diabetic kidney complication: Secondary | ICD-10-CM

## 2015-01-09 DIAGNOSIS — I10 Essential (primary) hypertension: Secondary | ICD-10-CM | POA: Diagnosis not present

## 2015-01-09 DIAGNOSIS — R938 Abnormal findings on diagnostic imaging of other specified body structures: Secondary | ICD-10-CM | POA: Diagnosis not present

## 2015-01-09 DIAGNOSIS — F17200 Nicotine dependence, unspecified, uncomplicated: Secondary | ICD-10-CM

## 2015-01-09 NOTE — Progress Notes (Signed)
Patient ID: Clarence Maldonado, male   DOB: [birth year], 69 y.o.   MRN: [id number]    HISTORY AND PHYSICAL  Location:    [facility]   Place of Service:   OFFICE  Extended Emergency Contact Information Primary Emergency Contact: [name],[name]          Ginette Otto, [place] Home Phone: [phone] Relation: Other  Advanced Directive information  None  Chief Complaint  Patient presents with  . Annual Exam    EV-No complaints    HPI:   Patient has not been seen in this office in a year. He returns today for an annual physical. Used to doing fairly well. There has been essentially no change in his health conditions. He has not had any significant health interventions in the last year. He continues to take his medications as directed.  Patient exercises little. He is overweight. He continues to smoke.  Essential hypertension: Controlled   Hyperlipidemia: Controlled   DM type 2, uncontrolled, with renal complications: Controlled   Chronic kidney disease, stage III (moderate): Unchanged  Tobacco use disorder: Continues to smoke   Abnormal CT of the chest: Patient has an abnormal right upper lobe lesion. Last CT of the chest was done 2012. Further follow-up should be considered.    Past Medical History  Diagnosis Date  . Type II or unspecified type diabetes mellitus with renal manifestations, uncontrolled   . Unspecified essential hypertension   . Obesity, unspecified   . Cardiomegaly   . Other and unspecified hyperlipidemia   . Chronic kidney disease, stage III (moderate)   . Chronic airway obstruction, not elsewhere classified   . Illiterate   . Closed femur fracture 1960  . Cholelithiasis   . Abnormal CT of the chest 2009    nodule 9 x 6 mm RUL  . Cerebrovascular accident (stroke) 2009    lacunar infarct in the left thalamus  . Tobacco use disorder   . Benign localized hyperplasia of prostate with urinary obstruction and other lower urinary tract symptoms  (LUTS)(600.21)     Past Surgical History  Procedure Laterality Date  . Femur fracture surgery Left 1960    Patient Care Team: Kimber Relic, MD as PCP - General (Internal Medicine)  History   Social History  . Marital Status: Single    Spouse Name: N/A  . Number of Children: N/A  . Years of Education: N/A   Occupational History  . Not on file.   Social History Main Topics  . Smoking status: Current Every Day Smoker -- 0.50 packs/day for 50 years    Types: Cigarettes  . Smokeless tobacco: Not on file  . Alcohol Use: No  . Drug Use: Not on file  . Sexual Activity: Not on file   Other Topics Concern  . Not on file   Social History Narrative     reports that he has been smoking Cigarettes.  He has a 25 pack-year smoking history. He does not have any smokeless tobacco history on file. He reports that he does not drink alcohol. His drug history is not on file.  Family History  Problem Relation Age of Onset  . Heart disease Mother    Family Status  Relation Status Death Age  . Mother Deceased   . Father Deceased   . Sister Alive   . Brother Deceased   . Daughter Alive   . Brother Deceased   . Sister Alive     Immunization History  Administered  Date(s) Administered  . Influenza Split 06/22/2011  . Pneumococcal Polysaccharide-23 01/11/2012  . Tdap 01/11/2012    No Known Allergies  Medications: Patient's Medications  New Prescriptions   No medications on file  Previous Medications   ADVAIR DISKUS 250-50 MCG/DOSE AEPB    inhale 1 dose by mouth twice a day   AMLODIPINE (NORVASC) 10 MG TABLET    take 1 tablet by mouth once daily   GLIMEPIRIDE (AMARYL) 1 MG TABLET    take 1 tablet by mouth once daily for BLOOD SUGAR   LISINOPRIL-HYDROCHLOROTHIAZIDE (PRINZIDE,ZESTORETIC) 20-25 MG PER TABLET    take 1 tablet by mouth every morning   LOVASTATIN (MEVACOR) 20 MG TABLET    take 1 tablet by mouth once daily for cholesterol   METOPROLOL SUCCINATE (TOPROL-XL) 25 MG  24 HR TABLET    take 1/2 tablet by mouth once daily   PROAIR HFA 108 (90 BASE) MCG/ACT INHALER    inhale 1 to 2 puffs every 4 to 6 hours if needed for BREATHING  Modified Medications   No medications on file  Discontinued Medications   No medications on file    Review of Systems  Constitutional: Negative for fever, chills and diaphoresis.  HENT: Positive for hearing loss. Negative for congestion, ear discharge, ear pain, nosebleeds, sore throat and tinnitus.   Eyes: Negative for photophobia, pain and discharge.  Respiratory: Positive for cough, shortness of breath and wheezing. Negative for stridor.        Abnormal CT scan of the chest in the past. Known since 2009.  Cardiovascular: Negative for chest pain, palpitations and leg swelling.  Gastrointestinal: Negative for vomiting, abdominal pain, diarrhea, constipation and blood in stool.  Endocrine: Negative for polydipsia.  Genitourinary: Negative for dysuria, urgency, frequency, hematuria and flank pain.       Diminished urinary stream and slow to initiate voiding.  Musculoskeletal: Negative for myalgias, back pain and neck pain.  Skin: Negative for rash.  Allergic/Immunologic: Negative for environmental allergies.  Neurological: Negative for dizziness, tremors, seizures, weakness and headaches.  Hematological: Does not bruise/bleed easily.  Psychiatric/Behavioral: Negative for suicidal ideas and hallucinations. The patient is not nervous/anxious.     Filed Vitals:   01/09/15 1348  BP: 138/88  Pulse: 78  Temp: 97.9 F (36.6 C)  TempSrc: Oral  Height: 5\' 8"  (1.727 m)  Weight: 209 lb 3.2 oz (94.892 kg)   Body mass index is 31.82 kg/(m^2).  Physical Exam  Constitutional: He is oriented to person, place, and time.  overweight  HENT:  Cerumen in the left EAC  Eyes: Conjunctivae and EOM are normal. Pupils are equal, round, and reactive to light.  Neck: No JVD present. No tracheal deviation present. No thyromegaly present.    Cardiovascular: Normal rate, regular rhythm and normal heart sounds.   Weak DP and PT bilaterally  Pulmonary/Chest: No respiratory distress. He has wheezes. He has rales. He exhibits no tenderness.  Abdominal: He exhibits no distension and no mass. There is no tenderness.  Musculoskeletal: Normal range of motion. He exhibits no edema or tenderness.  Lymphadenopathy:    He has no cervical adenopathy.  Neurological: He is alert and oriented to person, place, and time. No cranial nerve deficit. He exhibits normal muscle tone. Coordination normal.  Skin: No rash noted. No erythema. No pallor.  Corns on the right sole  Psychiatric: He has a normal mood and affect. His behavior is normal. Judgment and thought content normal.     Labs reviewed: Lab  on 01/07/2015  Component Date Value Ref Range Status  . Creatinine, Urine 01/07/2015 127.2  22.0 - 328.0 mg/dL Final  . Microalbum.,U,Random 01/07/2015 6.7  0.0 - 17.0 ug/mL Final  . MICROALB/CREAT RATIO 01/07/2015 5.3  0.0 - 30.0 mg/g creat Final  . Cholesterol, Total 01/07/2015 147  100 - 199 mg/dL Final  . Triglycerides 01/07/2015 253* 0 - 149 mg/dL Final  . HDL 60/45/4098 31* >39 mg/dL Final   Comment: According to ATP-III Guidelines, HDL-C >59 mg/dL is considered a negative risk factor for CHD.   Marland Kitchen VLDL Cholesterol Cal 01/07/2015 51* 5 - 40 mg/dL Final  . LDL Calculated 01/07/2015 65  0 - 99 mg/dL Final  . Chol/HDL Ratio 01/07/2015 4.7  0.0 - 5.0 ratio units Final   Comment:                                   T. Chol/HDL Ratio                                             Men  Women                               1/2 Avg.Risk  3.4    3.3                                   Avg.Risk  5.0    4.4                                2X Avg.Risk  9.6    7.1                                3X Avg.Risk 23.4   11.0   . Glucose 01/07/2015 80  65 - 99 mg/dL Final  . BUN 11/91/4782 33* 8 - 27 mg/dL Final  . Creatinine, Ser 01/07/2015 2.23* 0.76 - 1.27 mg/dL  Final  . GFR calc non Af Amer 01/07/2015 29* >59 mL/min/1.73 Final  . GFR calc Af Amer 01/07/2015 34* >59 mL/min/1.73 Final  . BUN/Creatinine Ratio 01/07/2015 15  10 - 22 Final  . Sodium 01/07/2015 139  134 - 144 mmol/L Final  . Potassium 01/07/2015 4.7  3.5 - 5.2 mmol/L Final  . Chloride 01/07/2015 101  97 - 108 mmol/L Final  . CO2 01/07/2015 21  18 - 29 mmol/L Final  . Calcium 01/07/2015 10.0  8.6 - 10.2 mg/dL Final  . Total Protein 01/07/2015 7.2  6.0 - 8.5 g/dL Final  . Albumin 95/62/1308 4.6  3.6 - 4.8 g/dL Final  . Globulin, Total 01/07/2015 2.6  1.5 - 4.5 g/dL Final  . Albumin/Globulin Ratio 01/07/2015 1.8  1.1 - 2.5 Final  . Bilirubin Total 01/07/2015 0.3  0.0 - 1.2 mg/dL Final  . Alkaline Phosphatase 01/07/2015 74  39 - 117 IU/L Final  . AST 01/07/2015 20  0 - 40 IU/L Final  . ALT 01/07/2015 19  0 - 44 IU/L Final  . WBC 01/07/2015 7.2  3.4 - 10.8 x10E3/uL Final  .  RBC 01/07/2015 5.42  4.14 - 5.80 x10E6/uL Final  . Hemoglobin 01/07/2015 16.1  12.6 - 17.7 g/dL Final  . HCT 04/54/0981 47.3  37.5 - 51.0 % Final  . MCV 01/07/2015 87  79 - 97 fL Final  . MCH 01/07/2015 29.7  26.6 - 33.0 pg Final  . MCHC 01/07/2015 34.0  31.5 - 35.7 g/dL Final  . RDW 19/14/7829 14.9  12.3 - 15.4 % Final  . Platelets 01/07/2015 192  150 - 379 x10E3/uL Final  . Neutrophils Relative % 01/07/2015 57   Final  . Lymphs 01/07/2015 29   Final  . Monocytes 01/07/2015 8   Final  . Eos 01/07/2015 5   Final  . Basos 01/07/2015 1   Final  . Neutrophils Absolute 01/07/2015 4.1  1.4 - 7.0 x10E3/uL Final  . Lymphocytes Absolute 01/07/2015 2.1  0.7 - 3.1 x10E3/uL Final  . Monocytes Absolute 01/07/2015 0.6  0.1 - 0.9 x10E3/uL Final  . Eosinophils Absolute 01/07/2015 0.3  0.0 - 0.4 x10E3/uL Final  . Basophils Absolute 01/07/2015 0.1  0.0 - 0.2 x10E3/uL Final  . Immature Granulocytes 01/07/2015 0   Final  . Immature Grans (Abs) 01/07/2015 0.0  0.0 - 0.1 x10E3/uL Final  . PSA 01/07/2015 0.7  0.0 - 4.0 ng/mL Final     Comment: Roche ECLIA methodology. According to the American Urological Association, Serum PSA should decrease and remain at undetectable levels after radical prostatectomy. The AUA defines biochemical recurrence as an initial PSA value 0.2 ng/mL or greater followed by a subsequent confirmatory PSA value 0.2 ng/mL or greater. Values obtained with different assay methods or kits cannot be used interchangeably. Results cannot be interpreted as absolute evidence of the presence or absence of malignant disease.   . Hgb A1c MFr Bld 01/07/2015 7.2* 4.8 - 5.6 % Final   Comment:          Pre-diabetes: 5.7 - 6.4          Diabetes: >6.4          Glycemic control for adults with diabetes: <7.0   . Est. average glucose Bld gHb Est-m* 01/07/2015 160   Final  . TSH 01/07/2015 1.530  0.450 - 4.500 uIU/mL Final       Assessment/Plan  1. Essential hypertension Controlled on current medication - Comprehensive metabolic panel; Future - EKG 12-Lead; Future  2. Hyperlipidemia Using lovastatin - Lipid panel; Future  3. DM type 2, uncontrolled, with renal complications Controlled on glimepiride - Hemoglobin A1c; Future - Comprehensive metabolic panel; Future - Microalbumin, urine; Future  4. Chronic kidney disease, stage III (moderate) Unchanged - Comprehensive metabolic panel; Future  5. Tobacco use disorder Advised to stop smoking  6. Abnormal CT scan, chest Consider further evaluation

## 2015-03-02 ENCOUNTER — Other Ambulatory Visit: Payer: Self-pay | Admitting: Internal Medicine

## 2015-04-01 ENCOUNTER — Other Ambulatory Visit: Payer: Self-pay | Admitting: Internal Medicine

## 2015-05-01 ENCOUNTER — Other Ambulatory Visit: Payer: Self-pay

## 2015-05-01 MED ORDER — FLUTICASONE-SALMETEROL 250-50 MCG/DOSE IN AEPB: INHALATION_SPRAY | RESPIRATORY_TRACT | Status: DC

## 2015-05-31 ENCOUNTER — Other Ambulatory Visit: Payer: Self-pay | Admitting: Internal Medicine

## 2015-07-08 ENCOUNTER — Other Ambulatory Visit: Payer: Self-pay | Admitting: Internal Medicine

## 2015-09-02 ENCOUNTER — Other Ambulatory Visit: Payer: Self-pay | Admitting: *Deleted

## 2015-09-02 MED ORDER — LOVASTATIN 20 MG PO TABS: ORAL_TABLET | ORAL | Status: DC

## 2015-09-02 NOTE — Telephone Encounter (Signed)
[facility] 

## 2015-09-28 ENCOUNTER — Other Ambulatory Visit: Payer: Self-pay | Admitting: Internal Medicine

## 2015-10-16 ENCOUNTER — Encounter: Payer: Self-pay | Admitting: Internal Medicine

## 2015-10-29 ENCOUNTER — Other Ambulatory Visit: Payer: Self-pay | Admitting: Internal Medicine

## 2015-12-27 ENCOUNTER — Other Ambulatory Visit: Payer: Self-pay | Admitting: *Deleted

## 2015-12-27 MED ORDER — FLUTICASONE-SALMETEROL 250-50 MCG/DOSE IN AEPB: INHALATION_SPRAY | RESPIRATORY_TRACT | Status: DC

## 2015-12-27 NOTE — Telephone Encounter (Signed)
[facility] 

## 2016-01-08 ENCOUNTER — Other Ambulatory Visit: Payer: Medicare Other

## 2016-01-08 DIAGNOSIS — N183 Chronic kidney disease, stage 3 unspecified: Secondary | ICD-10-CM

## 2016-01-08 DIAGNOSIS — E785 Hyperlipidemia, unspecified: Secondary | ICD-10-CM | POA: Diagnosis not present

## 2016-01-08 DIAGNOSIS — I10 Essential (primary) hypertension: Secondary | ICD-10-CM

## 2016-01-08 DIAGNOSIS — E1129 Type 2 diabetes mellitus with other diabetic kidney complication: Secondary | ICD-10-CM | POA: Diagnosis not present

## 2016-01-08 DIAGNOSIS — E1165 Type 2 diabetes mellitus with hyperglycemia: Principal | ICD-10-CM

## 2016-01-08 DIAGNOSIS — IMO0002 Reserved for concepts with insufficient information to code with codable children: Secondary | ICD-10-CM

## 2016-01-09 ENCOUNTER — Other Ambulatory Visit: Payer: Medicare Other

## 2016-01-09 LAB — COMPREHENSIVE METABOLIC PANEL
A/G RATIO: 1.6 (ref 1.2–2.2)
ALBUMIN: 4.6 g/dL (ref 3.6–4.8)
ALT: 14 IU/L (ref 0–44)
AST: 19 IU/L (ref 0–40)
Alkaline Phosphatase: 68 IU/L (ref 39–117)
BUN / CREAT RATIO: 13 (ref 10–24)
BUN: 26 mg/dL (ref 8–27)
Bilirubin Total: 0.3 mg/dL (ref 0.0–1.2)
CALCIUM: 9.7 mg/dL (ref 8.6–10.2)
CHLORIDE: 98 mmol/L (ref 96–106)
CO2: 23 mmol/L (ref 18–29)
Creatinine, Ser: 2 mg/dL — ABNORMAL HIGH (ref 0.76–1.27)
GFR, EST AFRICAN AMERICAN: 38 mL/min/{1.73_m2} — AB (ref >59)
GFR, EST NON AFRICAN AMERICAN: 33 mL/min/{1.73_m2} — AB (ref >59)
GLUCOSE: 120 mg/dL — AB (ref 65–99)
Globulin, Total: 2.8 g/dL (ref 1.5–4.5)
POTASSIUM: 4.7 mmol/L (ref 3.5–5.2)
Sodium: 141 mmol/L (ref 134–144)
TOTAL PROTEIN: 7.4 g/dL (ref 6.0–8.5)

## 2016-01-09 LAB — LIPID PANEL
CHOLESTEROL TOTAL: 153 mg/dL (ref 100–199)
Chol/HDL Ratio: 4.8 ratio units (ref 0.0–5.0)
HDL: 32 mg/dL — ABNORMAL LOW (ref >39)
LDL Calculated: 83 mg/dL (ref 0–99)
TRIGLYCERIDES: 192 mg/dL — AB (ref 0–149)
VLDL Cholesterol Cal: 38 mg/dL (ref 5–40)

## 2016-01-09 LAB — HEMOGLOBIN A1C
Est. average glucose Bld gHb Est-mCnc: 154 mg/dL
Hgb A1c MFr Bld: 7 % — ABNORMAL HIGH (ref 4.8–5.6)

## 2016-01-09 LAB — MICROALBUMIN, URINE: Microalbumin, Urine: 34.6 ug/mL

## 2016-01-14 ENCOUNTER — Encounter: Payer: Medicare Other | Admitting: Internal Medicine

## 2016-01-29 ENCOUNTER — Encounter: Payer: Self-pay | Admitting: Internal Medicine

## 2016-01-29 ENCOUNTER — Ambulatory Visit (INDEPENDENT_AMBULATORY_CARE_PROVIDER_SITE_OTHER): Payer: Medicare Other | Admitting: Internal Medicine

## 2016-01-29 VITALS — BP 118/72 | HR 98 | Temp 98.0°F | Resp 20 | Ht 68.0 in | Wt 201.2 lb

## 2016-01-29 DIAGNOSIS — R4189 Other symptoms and signs involving cognitive functions and awareness: Secondary | ICD-10-CM | POA: Diagnosis not present

## 2016-01-29 DIAGNOSIS — Z550 Illiteracy and low-level literacy: Secondary | ICD-10-CM

## 2016-01-29 DIAGNOSIS — E785 Hyperlipidemia, unspecified: Secondary | ICD-10-CM | POA: Diagnosis not present

## 2016-01-29 DIAGNOSIS — I10 Essential (primary) hypertension: Secondary | ICD-10-CM | POA: Diagnosis not present

## 2016-01-29 DIAGNOSIS — IMO0002 Reserved for concepts with insufficient information to code with codable children: Secondary | ICD-10-CM

## 2016-01-29 DIAGNOSIS — E1122 Type 2 diabetes mellitus with diabetic chronic kidney disease: Secondary | ICD-10-CM

## 2016-01-29 DIAGNOSIS — Z0000 Encounter for general adult medical examination without abnormal findings: Secondary | ICD-10-CM | POA: Diagnosis not present

## 2016-01-29 DIAGNOSIS — E1165 Type 2 diabetes mellitus with hyperglycemia: Secondary | ICD-10-CM

## 2016-01-29 DIAGNOSIS — N182 Chronic kidney disease, stage 2 (mild): Secondary | ICD-10-CM

## 2016-01-29 DIAGNOSIS — N183 Chronic kidney disease, stage 3 unspecified: Secondary | ICD-10-CM

## 2016-01-29 NOTE — Progress Notes (Addendum)
Patient ID: Clarence Maldonado, male   DOB: 1945-11-19, 70 y.o.   MRN: [id number]    HISTORY AND PHYSICAL  Location:    [place]   Place of Service:   OFFICE  Extended Emergency Contact Information Primary Emergency Contact: Maldonado,Clarence          Clarence Maldonado, [place] Phone: [phone] Relation: Other  Advanced Directive information Does patient have an advance directive?: No, Would patient like information on creating an advanced directive?: Yes - Educational materials given  Chief Complaint  Patient presents with  . Annual Exam    Annual Exam  . OTHER    Has friend in room with him  . Immunizations    Discuss pnuemonia shot  . OTHER    Advance directive given to patient  . Medical Management of Chronic Issues    blood pressure, blood sugar, cholesterol, CKD  . MMSE    15/30 failed clock    HPI:  Essential hypertension - controlled  Uncontrolled type 2 diabetes mellitus with stage 2 chronic kidney disease, without long-term current use of insulin (HCC) - stable and improved from last year  Hyperlipidemia - improved  Chronic kidney disease, stage III (moderate) - slightly improved  Illiterate - unchanged  Cognitive change - Patient is aware memory slippage. His literacy may make his score on the MMSE appear lower than his actual cognitive abilities..    Past Medical History  Diagnosis Date  . Type II or unspecified type diabetes mellitus with renal manifestations, uncontrolled   . Unspecified essential hypertension   . Obesity, unspecified   . Cardiomegaly   . Other and unspecified hyperlipidemia   . Chronic kidney disease, stage III (moderate)   . Chronic airway obstruction, not elsewhere classified   . Illiterate   . Closed femur fracture ([place]) 1960  . Cholelithiasis   . Abnormal CT of the chest 2009    nodule 9 x 6 mm RUL  . Cerebrovascular accident (stroke) ([place]) 2009    lacunar infarct in the left thalamus  . Tobacco use disorder     . Benign localized hyperplasia of prostate with urinary obstruction and other lower urinary tract symptoms (LUTS)(600.21)     Past Surgical History  Procedure Laterality Date  . Femur fracture surgery Left 1960    Patient Care Team: Estill Dooms, MD as PCP - General (Internal Medicine)  Social History   Social History  . Marital Status: Single    Spouse Name: N/A  . Number of Children: N/A  . Years of Education: N/A   Occupational History  . Not on file.   Social History Main Topics  . Smoking status: Current Every Day Smoker -- 0.50 packs/day for 50 years    Types: Cigarettes  . Smokeless tobacco: Not on file  . Alcohol Use: No  . Drug Use: Not on file  . Sexual Activity: Not on file   Other Topics Concern  . Not on file   Social History Narrative    reports that he has been smoking Cigarettes.  He has a 25 pack-year smoking history. He does not have any smokeless tobacco history on file. He reports that he does not drink alcohol. His drug history is not on file.  Family History  Problem Relation Age of Onset  . Heart disease Mother    Family Status  Relation Status Death Age  . Mother Deceased   . Father Deceased   . Sister Alive   .  Brother Deceased   . Daughter Alive   . Brother Deceased   . Sister Alive     Immunization History  Administered Date(s) Administered  . Influenza Split 06/22/2011  . Pneumococcal Polysaccharide-23 01/11/2012  . Tdap 01/11/2012    No Known Allergies  Medications: Patient's Medications  New Prescriptions   No medications on file  Previous Medications   AMLODIPINE (NORVASC) 10 MG TABLET    take 1 tablet by mouth once daily   FLUTICASONE-SALMETEROL (ADVAIR DISKUS) 250-50 MCG/DOSE AEPB    Inhale one dose by mouth twice daily   GLIMEPIRIDE (AMARYL) 1 MG TABLET    take 1 tablet by mouth once daily for BLOOD SUGAR.   LISINOPRIL-HYDROCHLOROTHIAZIDE (PRINZIDE,ZESTORETIC) 20-25 MG PER TABLET    take 1 tablet by mouth every  morning   LOVASTATIN (MEVACOR) 20 MG TABLET    Take one tablet by mouth once daily for cholesterol   METOPROLOL SUCCINATE (TOPROL-XL) 25 MG 24 HR TABLET    take 1/2 tablet by mouth once daily   PROAIR HFA 108 (90 BASE) MCG/ACT INHALER    inhale 1 to 2 puffs every 4 to 6 hours if needed for BREATHING.  Modified Medications   No medications on file  Discontinued Medications   No medications on file    Review of Systems  Constitutional: Negative for fever, chills and diaphoresis.  HENT: Positive for hearing loss. Negative for congestion, ear discharge, ear pain, nosebleeds, sore throat and tinnitus.   Eyes: Negative for photophobia, pain and discharge.  Respiratory: Positive for cough, shortness of breath and wheezing. Negative for stridor.        Abnormal CT scan of the chest in the past. Known since 2009.  Cardiovascular: Negative for chest pain, palpitations and leg swelling.  Gastrointestinal: Negative for vomiting, abdominal pain, diarrhea, constipation and blood in stool.  Endocrine: Negative for polydipsia.  Genitourinary: Negative for dysuria, urgency, frequency, hematuria and flank pain.       Diminished urinary stream and slow to initiate voiding.  Musculoskeletal: Negative for myalgias, back pain and neck pain.  Skin: Negative for rash.  Allergic/Immunologic: Negative for environmental allergies.  Neurological: Negative for dizziness, tremors, seizures, weakness and headaches.  Hematological: Does not bruise/bleed easily.  Psychiatric/Behavioral: Negative for suicidal ideas and hallucinations. The patient is not nervous/anxious.     Filed Vitals:   01/29/16 1305  BP: 118/72  Pulse: 98  Temp: 98 F (36.7 C)  TempSrc: Oral  Resp: 20  Height: '5\' 8"'  (1.727 m)  Weight: 201 lb 3.2 oz (91.264 kg)  SpO2: 94%   Body mass index is 30.6 kg/(m^2). Filed Weights   01/29/16 1305  Weight: 201 lb 3.2 oz (91.264 kg)     Physical Exam  Constitutional: He is oriented to person,  place, and time.  overweight  HENT:  Cerumen in the left EAC  Eyes: Conjunctivae and EOM are normal. Pupils are equal, round, and reactive to light.  Neck: No JVD present. No tracheal deviation present. No thyromegaly present.  Cardiovascular: Normal rate, regular rhythm and normal heart sounds.   Weak DP and PT bilaterally  Pulmonary/Chest: No respiratory distress. He has wheezes. He has rales. He exhibits no tenderness.  Abdominal: He exhibits no distension and no mass. There is no tenderness.  Musculoskeletal: Normal range of motion. He exhibits no edema or tenderness.  Lymphadenopathy:    He has no cervical adenopathy.  Neurological: He is alert and oriented to person, place, and time. No cranial nerve deficit.  He exhibits normal muscle tone. Coordination normal.  01/29/16 MMSE 15/30. Failed clock drawing.  Skin: No rash noted. No erythema. No pallor.  Corns on the right sole. Callus right heel.  Psychiatric: He has a normal mood and affect. His behavior is normal. Judgment and thought content normal.    Labs reviewed: Lab Summary Latest Ref Rng 01/08/2016 01/07/2015 12/29/2013  Hemoglobin 12.6 - 17.7 g/dL (None) 16.1 (None)  Hematocrit 37.5 - 51.0 % (None) 47.3 (None)  White count 3.4 - 10.8 x10E3/uL (None) 7.2 (None)  Platelet count 150 - 379 x10E3/uL (None) 192 (None)  Sodium 134 - 144 mmol/L 141 139 139  Potassium 3.5 - 5.2 mmol/L 4.7 4.7 4.5  Calcium 8.6 - 10.2 mg/dL 9.7 10.0 9.7  Phosphorus - (None) (None) (None)  Creatinine 0.76 - 1.27 mg/dL 2.00(H) 2.23(H) 1.81(H)  AST 0 - 40 IU/L '19 20 25  ' Alk Phos 39 - 117 IU/L 68 74 72  Bilirubin 0.0 - 1.2 mg/dL 0.3 0.3 0.4  Glucose 65 - 99 mg/dL 120(H) 80 89  Cholesterol - (None) (None) (None)  HDL cholesterol >39 mg/dL 32(L) 31(L) 31(L)  Triglycerides 0 - 149 mg/dL 192(H) 253(H) 281(H)  LDL Direct - (None) (None) (None)  LDL Calc 0 - 99 mg/dL 83 65 92  Total protein - (None) (None) (None)  Albumin 3.6 - 4.8 g/dL 4.6 4.6 4.6   Lab  Results  Component Value Date   BUN 26 01/08/2016   Lab Results  Component Value Date   HGBA1C 7.0* 01/08/2016   Lab Results  Component Value Date   TSH 1.530 01/07/2015    EKG: rate 95. NSR. Normal .  Assessment/Plan  1. Essential hypertension controlled - EKG 12-Lead  2. Uncontrolled type 2 diabetes mellitus with stage 2 chronic kidney disease, without long-term current use of insulin (HCC) - Hemoglobin A1c; Future - Microalbumin, urine; Future - Comprehensive metabolic panel; Future  3. Hyperlipidemia - Lipid panel; Future  4. Chronic kidney disease, stage III (moderate) unchanged  5. Illiterate unchanged  6. Cognitive change Aware of loss of memory. Illiteracy may alter score on the MMSE.

## 2016-02-19 NOTE — Addendum Note (Signed)
Addended by: Kimber Relic on: 02/19/2016 10:49 AM   Modules accepted: Level of Service

## 2016-03-11 NOTE — Addendum Note (Signed)
Addended by: Kimber Relic on: 03/11/2016 10:14 AM   Modules accepted: Level of Service

## 2016-04-23 ENCOUNTER — Encounter: Payer: Self-pay | Admitting: Internal Medicine

## 2016-12-30 ENCOUNTER — Other Ambulatory Visit: Payer: Self-pay | Admitting: *Deleted

## 2016-12-30 ENCOUNTER — Other Ambulatory Visit: Payer: Self-pay | Admitting: Internal Medicine

## 2016-12-30 MED ORDER — GLIMEPIRIDE 1 MG PO TABS: ORAL_TABLET | ORAL | 3 refills | Status: DC

## 2016-12-30 MED ORDER — LISINOPRIL-HYDROCHLOROTHIAZIDE 20-25 MG PO TABS: ORAL_TABLET | ORAL | 3 refills | Status: DC

## 2016-12-30 NOTE — Telephone Encounter (Signed)
[facility] 

## 2017-01-08 ENCOUNTER — Other Ambulatory Visit: Payer: Self-pay

## 2017-01-08 DIAGNOSIS — E1165 Type 2 diabetes mellitus with hyperglycemia: Principal | ICD-10-CM

## 2017-01-08 DIAGNOSIS — IMO0002 Reserved for concepts with insufficient information to code with codable children: Secondary | ICD-10-CM

## 2017-01-08 DIAGNOSIS — E1129 Type 2 diabetes mellitus with other diabetic kidney complication: Secondary | ICD-10-CM

## 2017-01-08 DIAGNOSIS — E7849 Other hyperlipidemia: Secondary | ICD-10-CM

## 2017-02-01 ENCOUNTER — Other Ambulatory Visit: Payer: Medicare Other

## 2017-02-01 ENCOUNTER — Ambulatory Visit (INDEPENDENT_AMBULATORY_CARE_PROVIDER_SITE_OTHER): Payer: Medicare Other

## 2017-02-01 VITALS — BP 124/80 | HR 78 | Temp 97.6°F | Ht 68.0 in | Wt 195.0 lb

## 2017-02-01 DIAGNOSIS — Z789 Other specified health status: Secondary | ICD-10-CM

## 2017-02-01 DIAGNOSIS — Z0000 Encounter for general adult medical examination without abnormal findings: Secondary | ICD-10-CM

## 2017-02-01 DIAGNOSIS — E7849 Other hyperlipidemia: Secondary | ICD-10-CM

## 2017-02-01 DIAGNOSIS — E1129 Type 2 diabetes mellitus with other diabetic kidney complication: Secondary | ICD-10-CM | POA: Diagnosis not present

## 2017-02-01 DIAGNOSIS — Z23 Encounter for immunization: Secondary | ICD-10-CM | POA: Diagnosis not present

## 2017-02-01 DIAGNOSIS — E1165 Type 2 diabetes mellitus with hyperglycemia: Principal | ICD-10-CM

## 2017-02-01 DIAGNOSIS — Z1211 Encounter for screening for malignant neoplasm of colon: Secondary | ICD-10-CM | POA: Diagnosis not present

## 2017-02-01 DIAGNOSIS — E784 Other hyperlipidemia: Secondary | ICD-10-CM | POA: Diagnosis not present

## 2017-02-01 DIAGNOSIS — IMO0002 Reserved for concepts with insufficient information to code with codable children: Secondary | ICD-10-CM

## 2017-02-01 LAB — LIPID PANEL
Cholesterol: 134 mg/dL (ref <200)
HDL: 30 mg/dL — ABNORMAL LOW (ref >40)
LDL CALC: 75 mg/dL (ref <100)
Total CHOL/HDL Ratio: 4.5 Ratio (ref <5.0)
Triglycerides: 147 mg/dL (ref <150)
VLDL: 29 mg/dL (ref <30)

## 2017-02-01 LAB — COMPREHENSIVE METABOLIC PANEL
ALK PHOS: 86 U/L (ref 40–115)
ALT: 11 U/L (ref 9–46)
AST: 13 U/L (ref 10–35)
Albumin: 4.3 g/dL (ref 3.6–5.1)
BILIRUBIN TOTAL: 0.4 mg/dL (ref 0.2–1.2)
BUN: 33 mg/dL — AB (ref 7–25)
CO2: 25 mmol/L (ref 20–31)
CREATININE: 2.18 mg/dL — AB (ref 0.70–1.18)
Calcium: 10.5 mg/dL — ABNORMAL HIGH (ref 8.6–10.3)
Chloride: 104 mmol/L (ref 98–110)
GLUCOSE: 107 mg/dL — AB (ref 65–99)
Potassium: 4.9 mmol/L (ref 3.5–5.3)
Sodium: 143 mmol/L (ref 135–146)
TOTAL PROTEIN: 7.6 g/dL (ref 6.1–8.1)

## 2017-02-01 NOTE — Progress Notes (Signed)
   I reviewed health advisor's note, was available for consultation and agree with the assessment and plan as written.  Will forward note to Dr. Chilton Si, current PCP for f/u on recommended health maintenance and pt concerns.  Cherice Glennie L. Saifan Rayford, D.O. Geriatrics [facility] [address], [place] [address] Cell Phone (Mon-Fri 8am-5pm):  [phone] On Call:  [phone] & follow prompts after 5pm & weekends Office Phone:  [phone] Office Fax:  [phone]   Quick Notes   Health Maintenance: Pn13 given today. Hep C, HgA1c due. Foot and eye exam due. Colonoscopy referral put in.  Abnormal Screen: MMSE 12/30. Passed clock drawing. O2 89%  Patient Concerns: SOB with exertion. Please go over his medications with him-says Metoprolol is "strong" and makes him tired when he takes it. He started taking 2, 81mg  aspirins on his own. Also takes Advil pm every single night.  Nurse Concerns: Medications.

## 2017-02-01 NOTE — Progress Notes (Signed)
Subjective:   Clarence Maldonado is a 71 y.o. male who presents for Medicare Annual/Subsequent preventive examination.      Objective:    Vitals: BP 124/80 (BP Location: Left Arm, Patient Position: Sitting)   Pulse 78   Temp 97.6 F (36.4 C) (Oral)   Ht 5\' 8"  (1.727 m)   Wt 195 lb (88.5 kg)   SpO2 (!) 89%   BMI 29.65 kg/m   Body mass index is 29.65 kg/m.  Tobacco History  Smoking Status  . Current Every Day Smoker  . Packs/day: 0.50  . Years: 60.00  . Types: Cigarettes  Smokeless Tobacco  . Never Used    Comment: Used to be 2 packs a day.     Ready to quit: Not Answered Counseling given: Not Answered   Past Medical History:  Diagnosis Date  . Abnormal CT of the chest 2009   nodule 9 x 6 mm RUL  . Benign localized hyperplasia of prostate with urinary obstruction and other lower urinary tract symptoms (LUTS)(600.21)   . Cardiomegaly   . Cerebrovascular accident (stroke) (HCC) 2009   lacunar infarct in the left thalamus  . Cholelithiasis   . Chronic airway obstruction, not elsewhere classified   . Chronic kidney disease, stage III (moderate)   . Closed femur fracture (HCC) 1960  . Illiterate   . Obesity, unspecified   . Other and unspecified hyperlipidemia   . Tobacco use disorder   . Type II or unspecified type diabetes mellitus with renal manifestations, uncontrolled(250.42)   . Unspecified essential hypertension    Past Surgical History:  Procedure Laterality Date  . FEMUR FRACTURE SURGERY Left 1960   Family History  Problem Relation Age of Onset  . Heart disease Mother    History  Sexual Activity  . Sexual activity: Not on file    Outpatient Encounter Prescriptions as of 02/01/2017  Medication Sig  . amLODipine (NORVASC) 10 MG tablet take 1 tablet by mouth once daily  . aspirin EC 81 MG tablet Take 81 mg by mouth 2 (two) times daily.  . Fluticasone-Salmeterol (ADVAIR DISKUS) 250-50 MCG/DOSE AEPB Inhale one dose by mouth twice daily  .  glimepiride (AMARYL) 1 MG tablet Take one tablet by mouth once daily for blood sugar  . ibuprofen (ADVIL,MOTRIN) 200 MG tablet Take 200 mg by mouth at bedtime.  Marland Kitchen lisinopril-hydrochlorothiazide (PRINZIDE,ZESTORETIC) 20-25 MG tablet Take one tablet by mouth once daily to control blood pressure  . lovastatin (MEVACOR) 20 MG tablet take 1 tablet by mouth once daily cholesterol  . metoprolol succinate (TOPROL-XL) 25 MG 24 hr tablet take 1/2 tablet by mouth once daily  . PROAIR HFA 108 (90 BASE) MCG/ACT inhaler inhale 1 to 2 puffs every 4 to 6 hours if needed for BREATHING.   No facility-administered encounter medications on file as of 02/01/2017.     Activities of Daily Living In your present state of health, do you have any difficulty performing the following activities: 02/01/2017  Hearing? Y  Vision? Y  Difficulty concentrating or making decisions? Y  Walking or climbing stairs? N  Dressing or bathing? N  Doing errands, shopping? N  Preparing Food and eating ? N  Using the Toilet? N  In the past six months, have you accidently leaked urine? N  Do you have problems with loss of bowel control? N  Managing your Medications? N  Managing your Finances? N  Housekeeping or managing your Housekeeping? N  Some recent data might be hidden  Patient Care Team: Kimber Relic, MD as PCP - General (Internal Medicine)   Assessment:    Exercise Activities and Dietary recommendations Current Exercise Habits: Home exercise routine, Type of exercise: walking, Frequency (Times/Week): 2, Intensity: Mild, Exercise limited by: None identified  Goals    . Exercise 3x per week (30 min per time)          Starting 02/01/2017 I will use silver sneakers and go to the gym 2-3 times a week.      Fall Risk Fall Risk  02/01/2017 01/29/2016 01/09/2015 01/03/2014  Falls in the past year? No No No No   Depression Screen PHQ 2/9 Scores 02/01/2017 01/29/2016 01/09/2015 01/03/2014  PHQ - 2 Score 0 0 0 0    Cognitive  Function MMSE - Mini Mental State Exam 02/01/2017 01/29/2016  Not completed: - (No Data)  Orientation to time 1 2  Orientation to Place 3 4  Registration 3 3  Attention/ Calculation 0 0  Recall 0 0  Language- name 2 objects 2 2  Language- repeat 0 1  Language- follow 3 step command 3 3  Language- read & follow direction 0 0  Write a sentence 0 0  Copy design 0 0  Total score 12 15        Immunization History  Administered Date(s) Administered  . Influenza Split 06/22/2011  . Pneumococcal Polysaccharide-23 01/11/2012  . Tdap 01/11/2012   Screening Tests Health Maintenance  Topic Date Due  . Hepatitis C Screening  1946/08/29  . OPHTHALMOLOGY EXAM  11/24/1956  . COLONOSCOPY  11/24/1996  . PNA vac Low Risk Adult (2 of 2 - PCV13) 01/10/2013  . FOOT EXAM  01/09/2016  . HEMOGLOBIN A1C  07/09/2016  . INFLUENZA VACCINE  05/05/2017  . TETANUS/TDAP  01/10/2022      Plan:    I have personally reviewed and addressed the Medicare Annual Wellness questionnaire and have noted the following in the patient's chart:  A. Medical and social history B. Use of alcohol, tobacco or illicit drugs  C. Current medications and supplements D. Functional ability and status E.  Nutritional status F.  Physical activity G. Advance directives H. List of other physicians I.  Hospitalizations, surgeries, and ER visits in previous 12 months J.  Vitals K. Screenings to include hearing, vision, cognitive, depression L. Referrals and appointments - none  In addition, I have reviewed and discussed with patient certain preventive protocols, quality metrics, and best practice recommendations. A written personalized care plan for preventive services as well as general preventive health recommendations were provided to patient.  See attached scanned questionnaire for additional information.   Signed,   Annetta Maw, RN Nurse Health Advisor

## 2017-02-01 NOTE — Patient Instructions (Addendum)
[name] , Thank you for taking time to come for your Medicare Wellness Visit. I appreciate your ongoing commitment to your health goals. Please review the following plan we discussed and let me know if I can assist you in the future.   Screening recommendations/referrals: Colonoscopy due. I will put in a referral for you. Recommended yearly ophthalmology/optometry visit for glaucoma screening and checkup Recommended yearly dental visit for hygiene and checkup  Vaccinations: Influenza vaccine due 06/2017 Pneumococcal vaccine up to date. PN13 given today. Tdap vaccine up to date. Due 01/10/2022 Shingles vaccine due. If you want the new one, Shingrix, let Clarence Maldonado know and we will put in a prescription.  Advanced directives: Advance directive discussed with you today. I have provided a copy for you to complete at home and have notarized. Once this is complete please bring a copy in to our office so we can scan it into your chart.   Conditions/risks identified: None  Next appointment: Dr. Chilton Si 5/2 @2pm   Preventive Care 65 Years and Older, Male Preventive care refers to lifestyle choices and visits with your health care provider that can promote health and wellness. What does preventive care include?  A yearly physical exam. This is also called an annual well check.  Dental exams once or twice a year.  Routine eye exams. Ask your health care provider how often you should have your eyes checked.  Personal lifestyle choices, including:  Daily care of your teeth and gums.  Regular physical activity.  Eating a healthy diet.  Avoiding tobacco and drug use.  Limiting alcohol use.  Practicing safe sex.  Taking low doses of aspirin every day.  Taking vitamin and mineral supplements as recommended by your health care provider. What happens during an annual well check? The services and screenings done by your health care provider during your annual well check will depend on your age,  overall health, lifestyle risk factors, and family history of disease. Counseling  Your health care provider may ask you questions about your:  Alcohol use.  Tobacco use.  Drug use.  Emotional well-being.  Home and relationship well-being.  Sexual activity.  Eating habits.  History of falls.  Memory and ability to understand (cognition).  Work and work Astronomer. Screening  You may have the following tests or measurements:  Height, weight, and BMI.  Blood pressure.  Lipid and cholesterol levels. These may be checked every 5 years, or more frequently if you are over 51 years old.  Skin check.  Lung cancer screening. You may have this screening every year starting at age 77 if you have a 30-pack-year history of smoking and currently smoke or have quit within the past 15 years.  Fecal occult blood test (FOBT) of the stool. You may have this test every year starting at age 83.  Flexible sigmoidoscopy or colonoscopy. You may have a sigmoidoscopy every 5 years or a colonoscopy every 10 years starting at age 40.  Prostate cancer screening. Recommendations will vary depending on your family history and other risks.  Hepatitis C blood test.  Hepatitis B blood test.  Sexually transmitted disease (STD) testing.  Diabetes screening. This is done by checking your blood sugar (glucose) after you have not eaten for a while (fasting). You may have this done every 1-3 years.  Abdominal aortic aneurysm (AAA) screening. You may need this if you are a current or former smoker.  Osteoporosis. You may be screened starting at age 36 if you are at high risk.  Talk with your health care provider about your test results, treatment options, and if necessary, the need for more tests. Vaccines  Your health care provider may recommend certain vaccines, such as:  Influenza vaccine. This is recommended every year.  Tetanus, diphtheria, and acellular pertussis (Tdap, Td) vaccine. You may  need a Td booster every 10 years.  Zoster vaccine. You may need this after age 79.  Pneumococcal 13-valent conjugate (PCV13) vaccine. One dose is recommended after age 46.  Pneumococcal polysaccharide (PPSV23) vaccine. One dose is recommended after age 48. Talk to your health care provider about which screenings and vaccines you need and how often you need them. This information is not intended to replace advice given to you by your health care provider. Make sure you discuss any questions you have with your health care provider. Document Released: 10/18/2015 Document Revised: 06/10/2016 Document Reviewed: 07/23/2015 Elsevier Interactive Patient Education  2017 [place] Prevention in the Home Falls can cause injuries. They can happen to people of all ages. There are many things you can do to make your home safe and to help prevent falls. What can I do on the outside of my home?  Regularly fix the edges of walkways and driveways and fix any cracks.  Remove anything that might make you trip as you walk through a door, such as a raised step or threshold.  Trim any bushes or trees on the path to your home.  Use bright outdoor lighting.  Clear any walking paths of anything that might make someone trip, such as rocks or tools.  Regularly check to see if handrails are loose or broken. Make sure that both sides of any steps have handrails.  Any raised decks and porches should have guardrails on the edges.  Have any leaves, snow, or ice cleared regularly.  Use sand or salt on walking paths during winter.  Clean up any spills in your garage right away. This includes oil or grease spills. What can I do in the bathroom?  Use night lights.  Install grab bars by the toilet and in the tub and shower. Do not use towel bars as grab bars.  Use non-skid mats or decals in the tub or shower.  If you need to sit down in the shower, use a plastic, non-slip stool.  Keep the floor  dry. Clean up any water that spills on the floor as soon as it happens.  Remove soap buildup in the tub or shower regularly.  Attach bath mats securely with double-sided non-slip rug tape.  Do not have throw rugs and other things on the floor that can make you trip. What can I do in the bedroom?  Use night lights.  Make sure that you have a light by your bed that is easy to reach.  Do not use any sheets or blankets that are too big for your bed. They should not hang down onto the floor.  Have a firm chair that has side arms. You can use this for support while you get dressed.  Do not have throw rugs and other things on the floor that can make you trip. What can I do in the kitchen?  Clean up any spills right away.  Avoid walking on wet floors.  Keep items that you use a lot in easy-to-reach places.  If you need to reach something above you, use a strong step stool that has a grab bar.  Keep electrical cords out of the way.  Do not use floor polish or wax that makes floors slippery. If you must use wax, use non-skid floor wax.  Do not have throw rugs and other things on the floor that can make you trip. What can I do with my stairs?  Do not leave any items on the stairs.  Make sure that there are handrails on both sides of the stairs and use them. Fix handrails that are broken or loose. Make sure that handrails are as long as the stairways.  Check any carpeting to make sure that it is firmly attached to the stairs. Fix any carpet that is loose or worn.  Avoid having throw rugs at the top or bottom of the stairs. If you do have throw rugs, attach them to the floor with carpet tape.  Make sure that you have a light switch at the top of the stairs and the bottom of the stairs. If you do not have them, ask someone to add them for you. What else can I do to help prevent falls?  Wear shoes that:  Do not have high heels.  Have rubber bottoms.  Are comfortable and fit you  well.  Are closed at the toe. Do not wear sandals.  If you use a stepladder:  Make sure that it is fully opened. Do not climb a closed stepladder.  Make sure that both sides of the stepladder are locked into place.  Ask someone to hold it for you, if possible.  Clearly mark and make sure that you can see:  Any grab bars or handrails.  First and last steps.  Where the edge of each step is.  Use tools that help you move around (mobility aids) if they are needed. These include:  Canes.  Walkers.  Scooters.  Crutches.  Turn on the lights when you go into a dark area. Replace any light bulbs as soon as they burn out.  Set up your furniture so you have a clear path. Avoid moving your furniture around.  If any of your floors are uneven, fix them.  If there are any pets around you, be aware of where they are.  Review your medicines with your doctor. Some medicines can make you feel dizzy. This can increase your chance of falling. Ask your doctor what other things that you can do to help prevent falls. This information is not intended to replace advice given to you by your health care provider. Make sure you discuss any questions you have with your health care provider. Document Released: 07/18/2009 Document Revised: 02/27/2016 Document Reviewed: 10/26/2014 Elsevier Interactive Patient Education  2017 [employer].

## 2017-02-02 LAB — MICROALBUMIN / CREATININE URINE RATIO
CREATININE, URINE: 122 mg/dL (ref 20–370)
MICROALB UR: 2.2 mg/dL
Microalb Creat Ratio: 18 mcg/mg creat (ref <30)

## 2017-02-02 LAB — HEMOGLOBIN A1C
HEMOGLOBIN A1C: 6.3 % — AB (ref <5.7)
Mean Plasma Glucose: 134 mg/dL

## 2017-02-03 ENCOUNTER — Encounter: Payer: Self-pay | Admitting: Internal Medicine

## 2017-02-03 ENCOUNTER — Ambulatory Visit (INDEPENDENT_AMBULATORY_CARE_PROVIDER_SITE_OTHER): Payer: Medicare Other | Admitting: Internal Medicine

## 2017-02-03 VITALS — BP 135/75 | HR 98 | Temp 98.5°F | Ht 68.0 in | Wt 199.0 lb

## 2017-02-03 DIAGNOSIS — F5101 Primary insomnia: Secondary | ICD-10-CM | POA: Diagnosis not present

## 2017-02-03 DIAGNOSIS — IMO0002 Reserved for concepts with insufficient information to code with codable children: Secondary | ICD-10-CM

## 2017-02-03 DIAGNOSIS — E7849 Other hyperlipidemia: Secondary | ICD-10-CM

## 2017-02-03 DIAGNOSIS — E1129 Type 2 diabetes mellitus with other diabetic kidney complication: Secondary | ICD-10-CM | POA: Diagnosis not present

## 2017-02-03 DIAGNOSIS — N183 Chronic kidney disease, stage 3 unspecified: Secondary | ICD-10-CM

## 2017-02-03 DIAGNOSIS — I10 Essential (primary) hypertension: Secondary | ICD-10-CM | POA: Diagnosis not present

## 2017-02-03 DIAGNOSIS — E784 Other hyperlipidemia: Secondary | ICD-10-CM

## 2017-02-03 DIAGNOSIS — G4700 Insomnia, unspecified: Secondary | ICD-10-CM | POA: Insufficient documentation

## 2017-02-03 DIAGNOSIS — E1165 Type 2 diabetes mellitus with hyperglycemia: Secondary | ICD-10-CM

## 2017-02-03 MED ORDER — DIPHENHYDRAMINE HCL 25 MG PO CAPS: ORAL_CAPSULE | ORAL | 0 refills | Status: DC

## 2017-02-03 NOTE — Patient Instructions (Addendum)
Stop Advil PM. (It can cause damage to the kidneys). For sleep use Benadryl (diphenhydramine).  Stop amlodipine.  Take all other medications.

## 2017-02-03 NOTE — Progress Notes (Signed)
HISTORY AND PHYSICAL  Location:    [facility]   Place of Service:   OFFICE  Extended Emergency Contact Information Primary Emergency Contact: Clarence Maldonado,Clarence Maldonado          [place], [place] Phone: [phone] Relation: Other  Advanced Directive information Does Patient Have a Medical Advance Directive?: No  Chief Complaint  Patient presents with  . Annual Exam    Physical exam  . Medical Management of Chronic Issues    medication management blood pressure, blood sugar, cholesterol, CKD, memory, Review labs.   . Medication Management    checks blood pressure at home, if it is normal he won't take blood pressure pills.     HPI:   Patient returns for annual exam. No new health issues in the last year.  Essential hypertension - Checks his BP at least daily at home. Generally OK, but sometimes it seems to run low. He skips his metoprolol on those days. Usually runs high the next day. BP therefore swings up and down and there is not good control of has heart rate. He says it is frequently fast..  CKD (chronic kidney disease) stage 3, GFR 30-59 ml/min - stable. Using Advil Pm for sleep. Was unaware it can potentially hurt his kidneys.  Uncontrolled type 2 diabetes mellitus with other diabetic kidney complication, without long-term current use of insulin ([place]) - controlled at this time with glimepiride  Other hyperlipidemia - controlled  Primary insomnia - feels that he needs something for rest. Was sleeping well ob Advil PM, but he admits he does not have pain keeping him awake.    Past Medical History:  Diagnosis Date  . Abnormal CT of the chest 2009   nodule 9 x 6 mm RUL  . Benign localized hyperplasia of prostate with urinary obstruction and other lower urinary tract symptoms (LUTS)(600.21)   . Cardiomegaly   . Cerebrovascular accident (stroke) ([place]) 2009   lacunar infarct in the left thalamus  . Cholelithiasis   . Chronic airway obstruction, not  elsewhere classified   . Chronic kidney disease, stage III (moderate)   . Closed femur fracture ([place]) 1960  . Illiterate   . Obesity, unspecified   . Other and unspecified hyperlipidemia   . Tobacco use disorder   . Type II or unspecified type diabetes mellitus with renal manifestations, uncontrolled(250.42)   . Unspecified essential hypertension     Past Surgical History:  Procedure Laterality Date  . FEMUR FRACTURE SURGERY Left 1960    Patient Care Team: Estill Dooms, MD as PCP - General (Internal Medicine)  Social History   Social History  . Marital status: Single    Spouse name: N/A  . Number of children: N/A  . Years of education: N/A   Occupational History  . Not on file.   Social History Main Topics  . Smoking status: Current Every Day Smoker    Packs/day: 0.50    Years: 60.00    Types: Cigarettes  . Smokeless tobacco: Never Used     Comment: Used to be 2 packs a day.  . Alcohol use 0.6 oz/week    1 Cans of beer per week     Comment: 3-4  a week   . Drug use: No  . Sexual activity: Not on file   Other Topics Concern  . Not on file   Social History Narrative  . No narrative on file    reports that he has been smoking Cigarettes.  He has a 30.00 pack-year smoking history. He has never used smokeless tobacco. He reports that he drinks about 0.6 oz of alcohol per week . He reports that he does not use drugs.  Family History  Problem Relation Age of Onset  . Heart disease Mother    Family Status  Relation Status  . Mother Deceased  . Father Deceased  . Sister Alive  . Brother Deceased  . Daughter Alive  . Brother Deceased  . Sister Alive    Immunization History  Administered Date(s) Administered  . Influenza Split 06/22/2011  . Pneumococcal Conjugate-13 02/01/2017  . Pneumococcal Polysaccharide-23 01/11/2012  . Tdap 01/11/2012    No Known Allergies  Medications: Patient's Medications  New Prescriptions   No medications on file    Previous Medications   AMLODIPINE (NORVASC) 10 MG TABLET    take 1 tablet by mouth once daily   ASPIRIN EC 81 MG TABLET    Take 81 mg by mouth 2 (two) times daily.   FLUTICASONE-SALMETEROL (ADVAIR DISKUS) 250-50 MCG/DOSE AEPB    Inhale one dose by mouth twice daily   GLIMEPIRIDE (AMARYL) 1 MG TABLET    Take one tablet by mouth once daily for blood sugar   IBUPROFEN-DIPHENHYDRAMINE CIT (ADVIL PM PO)    Take by mouth. Take one tablet at bedtime for sleep   LISINOPRIL-HYDROCHLOROTHIAZIDE (PRINZIDE,ZESTORETIC) 20-25 MG TABLET    Take one tablet by mouth once daily to control blood pressure   LOVASTATIN (MEVACOR) 20 MG TABLET    take 1 tablet by mouth once daily cholesterol   METOPROLOL SUCCINATE (TOPROL-XL) 25 MG 24 HR TABLET    Take 25 mg by mouth. Take 1/2 tablet once daily for blood pressure if needed.   PROAIR HFA 108 (90 BASE) MCG/ACT INHALER    inhale 1 to 2 puffs every 4 to 6 hours if needed for BREATHING.  Modified Medications   No medications on file  Discontinued Medications   IBUPROFEN (ADVIL,MOTRIN) 200 MG TABLET    Take 200 mg by mouth at bedtime.   METOPROLOL SUCCINATE (TOPROL-XL) 25 MG 24 HR TABLET    take 1/2 tablet by mouth once daily    Review of Systems  Constitutional: Negative for chills, diaphoresis and fever.  HENT: Positive for hearing loss. Negative for congestion, ear discharge, ear pain, nosebleeds, sore throat and tinnitus.   Eyes: Negative for photophobia, pain and discharge.  Respiratory: Positive for shortness of breath and wheezing. Negative for cough and stridor.        Abnormal CT scan of the chest in the past. Known since 2009.  Cardiovascular: Negative for chest pain, palpitations and leg swelling.  Gastrointestinal: Negative for abdominal pain, blood in stool, constipation, diarrhea and vomiting.  Endocrine: Negative for polydipsia.  Genitourinary: Negative for dysuria, flank pain, frequency, hematuria and urgency.       Diminished urinary stream and slow  to initiate voiding.  Musculoskeletal: Negative for back pain, myalgias and neck pain.  Skin: Negative for rash.  Allergic/Immunologic: Negative for environmental allergies.  Neurological: Negative for dizziness, tremors, seizures, weakness and headaches.  Hematological: Does not bruise/bleed easily.  Psychiatric/Behavioral: Negative for hallucinations and suicidal ideas. The patient is not nervous/anxious.     Vitals:   02/03/17 1434 02/03/17 1514  BP: (!) 142/78 135/75  Pulse: (!) 126 98  Temp: 98.5 F (36.9 C)   TempSrc: Oral   SpO2: 90%   Weight: 199 lb (90.3 kg)   Height: '5\' 8"'  (1.727 m)  Body mass index is 30.26 kg/m. Filed Weights   02/03/17 1434  Weight: 199 lb (90.3 kg)     Physical Exam  Constitutional: He is oriented to person, place, and time.  overweight  HENT:  Cerumen in the left EAC  Eyes: Conjunctivae and EOM are normal. Pupils are equal, round, and reactive to light.  Neck: No JVD present. No tracheal deviation present. No thyromegaly present.  Cardiovascular: Normal rate, regular rhythm and normal heart sounds.   Weak DP and PT bilaterally  Pulmonary/Chest: No respiratory distress. He has wheezes. He has rales. He exhibits no tenderness.  Abdominal: He exhibits no distension and no mass. There is no tenderness.  Musculoskeletal: Normal range of motion. He exhibits no edema or tenderness.  Lymphadenopathy:    He has no cervical adenopathy.  Neurological: He is alert and oriented to person, place, and time. No cranial nerve deficit. He exhibits normal muscle tone. Coordination normal.  01/29/16 MMSE 15/30. Failed clock drawing.  Skin: No rash noted. No erythema. No pallor.  Corns on the right sole. Callus right heel.  Psychiatric: He has a normal mood and affect. His behavior is normal. Judgment and thought content normal.    Labs reviewed: Lab Summary Latest Ref Rng & Units 02/01/2017 01/08/2016 01/07/2015  Hemoglobin 12.6 - 17.7 g/dL (None) (None)  16.1  Hematocrit 37.5 - 51.0 % (None) (None) 47.3  White count 3.4 - 10.8 x10E3/uL (None) (None) 7.2  Platelet count 150 - 379 x10E3/uL (None) (None) 192  Sodium 135 - 146 mmol/L 143 141 139  Potassium 3.5 - 5.3 mmol/L 4.9 4.7 4.7  Calcium 8.6 - 10.3 mg/dL 10.5(H) 9.7 10.0  Phosphorus - (None) (None) (None)  Creatinine 0.70 - 1.18 mg/dL 2.18(H) 2.00(H) 2.23(H)  AST 10 - 35 U/L '13 19 20  ' Alk Phos 40 - 115 U/L 86 68 74  Bilirubin 0.2 - 1.2 mg/dL 0.4 0.3 0.3  Glucose 65 - 99 mg/dL 107(H) 120(H) 80  Cholesterol <200 mg/dL 134 (None) (None)  HDL cholesterol >40 mg/dL 30(L) 32(L) 31(L)  Triglycerides <150 mg/dL 147 192(H) 253(H)  LDL Direct - (None) (None) (None)  LDL Calc <100 mg/dL 75 83 65  Total protein 6.1 - 8.1 g/dL 7.6 (None) (None)  Albumin 3.6 - 5.1 g/dL 4.3 4.6 4.6  Some recent data might be hidden   Lab Results  Component Value Date   BUN 33 (H) 02/01/2017   Lab Results  Component Value Date   HGBA1C 6.3 (H) 02/01/2017   Lab Results  Component Value Date   TSH 1.530 01/07/2015    02/03/17 EKG: rate 98. NSR. Possible old ASMI.  Assessment/Plan  1. Essential hypertension The current medical regimen is effective;  continue present plan and medications. - EKG 12-Lead - Comprehensive metabolic panel; Future  2. CKD (chronic kidney disease) stage 3, GFR 30-59 ml/min Stop Advil PM, due to potential to harm kidneys - Comprehensive metabolic panel; Future  3. Uncontrolled type 2 diabetes mellitus with other diabetic kidney complication, without long-term current use of insulin ([facility]) The current medical regimen is effective;  continue present plan and medications. - Comprehensive metabolic panel; Future - Hemoglobin A1c; Future - Microalbumin, urine; Future  4. Other hyperlipidemia Continue lovastatin - Lipid panel; Future  5. Primary insomnia - diphenhydrAMINE (BENADRYL) 25 mg capsule; One at bed if needed for sleep  Dispense: 30 capsule; Refill: 0

## 2017-02-08 ENCOUNTER — Encounter: Payer: Medicare Other | Admitting: Nurse Practitioner

## 2017-04-02 NOTE — Addendum Note (Signed)
Addended by: Josph Macho A on: 04/02/2017 01:27 PM   Modules accepted: Orders

## 2017-05-06 ENCOUNTER — Other Ambulatory Visit: Payer: Self-pay | Admitting: Internal Medicine

## 2017-06-08 ENCOUNTER — Other Ambulatory Visit: Payer: Self-pay | Admitting: *Deleted

## 2017-06-08 ENCOUNTER — Other Ambulatory Visit: Payer: Self-pay | Admitting: Internal Medicine

## 2017-06-08 MED ORDER — GLIMEPIRIDE 1 MG PO TABS: ORAL_TABLET | ORAL | 3 refills | Status: DC

## 2017-06-08 MED ORDER — LISINOPRIL-HYDROCHLOROTHIAZIDE 20-25 MG PO TABS: ORAL_TABLET | ORAL | 3 refills | Status: DC

## 2017-06-08 NOTE — Telephone Encounter (Signed)
[facility] 

## 2017-07-05 ENCOUNTER — Other Ambulatory Visit: Payer: Self-pay | Admitting: Internal Medicine

## 2017-11-07 ENCOUNTER — Other Ambulatory Visit: Payer: Self-pay | Admitting: Internal Medicine

## 2018-01-09 ENCOUNTER — Other Ambulatory Visit: Payer: Self-pay | Admitting: Nurse Practitioner

## 2018-01-19 ENCOUNTER — Telehealth: Payer: Self-pay | Admitting: Nurse Practitioner

## 2018-01-19 NOTE — Telephone Encounter (Signed)
I called the patient to schedule his AWV with labs or before EV appt.  There was no answer, and his voicemail hasn't been set up yet.  I will try again later. VDM (DD)

## 2018-02-04 ENCOUNTER — Other Ambulatory Visit: Payer: Medicare Other

## 2018-02-04 ENCOUNTER — Ambulatory Visit (INDEPENDENT_AMBULATORY_CARE_PROVIDER_SITE_OTHER): Payer: Medicare Other

## 2018-02-04 ENCOUNTER — Other Ambulatory Visit: Payer: Self-pay

## 2018-02-04 VITALS — BP 128/70 | HR 97 | Temp 98.5°F | Ht 68.0 in | Wt 177.0 lb

## 2018-02-04 DIAGNOSIS — Z0000 Encounter for general adult medical examination without abnormal findings: Secondary | ICD-10-CM | POA: Diagnosis not present

## 2018-02-04 DIAGNOSIS — E1129 Type 2 diabetes mellitus with other diabetic kidney complication: Secondary | ICD-10-CM | POA: Diagnosis not present

## 2018-02-04 DIAGNOSIS — I10 Essential (primary) hypertension: Secondary | ICD-10-CM

## 2018-02-04 DIAGNOSIS — E1165 Type 2 diabetes mellitus with hyperglycemia: Secondary | ICD-10-CM | POA: Diagnosis not present

## 2018-02-04 DIAGNOSIS — Z135 Encounter for screening for eye and ear disorders: Secondary | ICD-10-CM | POA: Diagnosis not present

## 2018-02-04 DIAGNOSIS — IMO0002 Reserved for concepts with insufficient information to code with codable children: Secondary | ICD-10-CM

## 2018-02-04 DIAGNOSIS — E785 Hyperlipidemia, unspecified: Secondary | ICD-10-CM

## 2018-02-04 LAB — COMPREHENSIVE METABOLIC PANEL
AG Ratio: 1.3 (calc) (ref 1.0–2.5)
ALBUMIN MSPROF: 4.2 g/dL (ref 3.6–5.1)
ALKALINE PHOSPHATASE (APISO): 67 U/L (ref 40–115)
ALT: 9 U/L (ref 9–46)
AST: 13 U/L (ref 10–35)
BILIRUBIN TOTAL: 0.7 mg/dL (ref 0.2–1.2)
BUN/Creatinine Ratio: 15 (calc) (ref 6–22)
BUN: 33 mg/dL — AB (ref 7–25)
CALCIUM: 9.4 mg/dL (ref 8.6–10.3)
CHLORIDE: 101 mmol/L (ref 98–110)
CO2: 28 mmol/L (ref 20–32)
Creat: 2.14 mg/dL — ABNORMAL HIGH (ref 0.70–1.18)
GLOBULIN: 3.2 g/dL (ref 1.9–3.7)
Glucose, Bld: 80 mg/dL (ref 65–99)
POTASSIUM: 4.6 mmol/L (ref 3.5–5.3)
Sodium: 139 mmol/L (ref 135–146)
Total Protein: 7.4 g/dL (ref 6.1–8.1)

## 2018-02-04 LAB — LIPID PANEL
CHOL/HDL RATIO: 4.1 (calc) (ref <5.0)
Cholesterol: 135 mg/dL (ref <200)
HDL: 33 mg/dL — ABNORMAL LOW (ref >40)
LDL CHOLESTEROL (CALC): 81 mg/dL
Non-HDL Cholesterol (Calc): 102 mg/dL (calc) (ref <130)
Triglycerides: 111 mg/dL (ref <150)

## 2018-02-04 NOTE — Addendum Note (Signed)
Addended by: Sueanne Margarita on: 02/04/2018 09:46 AM   Modules accepted: Orders

## 2018-02-04 NOTE — Patient Instructions (Addendum)
Clarence Maldonado , Thank you for taking time to come for your Medicare Wellness Visit. I appreciate your ongoing commitment to your health goals. Please review the following plan we discussed and let me know if I can assist you in the future.   Screening recommendations/referrals: Colonoscopy due, declined Recommended yearly ophthalmology/optometry visit for glaucoma screening and checkup Recommended yearly dental visit for hygiene and checkup  Vaccinations: Influenza vaccine due 2019 fall season Pneumococcal vaccine up to date, completed Tdap vaccine up to date, due 01/10/2022 Shingles vaccine due, declined    Advanced directives: Advance directive discussed with you today. I have provided a copy for you to complete at home and have notarized. Once this is complete please bring a copy in to our office so we can scan it into your chart.  Conditions/risks identified: none  Next appointment: Dr. Renato Gails 02/10/2018 @ 1:30pm             Tyron Russell, RN  02/06/2019 @ 10:45am  Preventive Care 65 Years and Older, Male Preventive care refers to lifestyle choices and visits with your health care provider that can promote health and wellness. What does preventive care include?  A yearly physical exam. This is also called an annual well check.  Dental exams once or twice a year.  Routine eye exams. Ask your health care provider how often you should have your eyes checked.  Personal lifestyle choices, including:  Daily care of your teeth and gums.  Regular physical activity.  Eating a healthy diet.  Avoiding tobacco and drug use.  Limiting alcohol use.  Practicing safe sex.  Taking low doses of aspirin every day.  Taking vitamin and mineral supplements as recommended by your health care provider. What happens during an annual well check? The services and screenings done by your health care provider during your annual well check will depend on your age, overall health, lifestyle risk factors,  and family history of disease. Counseling  Your health care provider may ask you questions about your:  Alcohol use.  Tobacco use.  Drug use.  Emotional well-being.  Home and relationship well-being.  Sexual activity.  Eating habits.  History of falls.  Memory and ability to understand (cognition).  Work and work Astronomer. Screening  You may have the following tests or measurements:  Height, weight, and BMI.  Blood pressure.  Lipid and cholesterol levels. These may be checked every 5 years, or more frequently if you are over 59 years old.  Skin check.  Lung cancer screening. You may have this screening every year starting at age 83 if you have a 30-pack-year history of smoking and currently smoke or have quit within the past 15 years.  Fecal occult blood test (FOBT) of the stool. You may have this test every year starting at age 56.  Flexible sigmoidoscopy or colonoscopy. You may have a sigmoidoscopy every 5 years or a colonoscopy every 10 years starting at age 4.  Prostate cancer screening. Recommendations will vary depending on your family history and other risks.  Hepatitis C blood test.  Hepatitis B blood test.  Sexually transmitted disease (STD) testing.  Diabetes screening. This is done by checking your blood sugar (glucose) after you have not eaten for a while (fasting). You may have this done every 1-3 years.  Abdominal aortic aneurysm (AAA) screening. You may need this if you are a current or former smoker.  Osteoporosis. You may be screened starting at age 64 if you are at high risk. Talk with  your health care provider about your test results, treatment options, and if necessary, the need for more tests. Vaccines  Your health care provider may recommend certain vaccines, such as:  Influenza vaccine. This is recommended every year.  Tetanus, diphtheria, and acellular pertussis (Tdap, Td) vaccine. You may need a Td booster every 10  years.  Zoster vaccine. You may need this after age 71.  Pneumococcal 13-valent conjugate (PCV13) vaccine. One dose is recommended after age 71.  Pneumococcal polysaccharide (PPSV23) vaccine. One dose is recommended after age 71. Talk to your health care provider about which screenings and vaccines you need and how often you need them. This information is not intended to replace advice given to you by your health care provider. Make sure you discuss any questions you have with your health care provider. Document Released: 10/18/2015 Document Revised: 06/10/2016 Document Reviewed: 07/23/2015 Elsevier Interactive Patient Education  2017 [employer]  Fall Prevention in the Home Falls can cause injuries. They can happen to people of all ages. There are many things you can do to make your home safe and to help prevent falls. What can I do on the outside of my home?  Regularly fix the edges of walkways and driveways and fix any cracks.  Remove anything that might make you trip as you walk through a door, such as a raised step or threshold.  Trim any bushes or trees on the path to your home.  Use bright outdoor lighting.  Clear any walking paths of anything that might make someone trip, such as rocks or tools.  Regularly check to see if handrails are loose or broken. Make sure that both sides of any steps have handrails.  Any raised decks and porches should have guardrails on the edges.  Have any leaves, snow, or ice cleared regularly.  Use sand or salt on walking paths during winter.  Clean up any spills in your garage right away. This includes oil or grease spills. What can I do in the bathroom?  Use night lights.  Install grab bars by the toilet and in the tub and shower. Do not use towel bars as grab bars.  Use non-skid mats or decals in the tub or shower.  If you need to sit down in the shower, use a plastic, non-slip stool.  Keep the floor dry. Clean up any water that  spills on the floor as soon as it happens.  Remove soap buildup in the tub or shower regularly.  Attach bath mats securely with double-sided non-slip rug tape.  Do not have throw rugs and other things on the floor that can make you trip. What can I do in the bedroom?  Use night lights.  Make sure that you have a light by your bed that is easy to reach.  Do not use any sheets or blankets that are too big for your bed. They should not hang down onto the floor.  Have a firm chair that has side arms. You can use this for support while you get dressed.  Do not have throw rugs and other things on the floor that can make you trip. What can I do in the kitchen?  Clean up any spills right away.  Avoid walking on wet floors.  Keep items that you use a lot in easy-to-reach places.  If you need to reach something above you, use a strong step stool that has a grab bar.  Keep electrical cords out of the way.  Do not  use floor polish or wax that makes floors slippery. If you must use wax, use non-skid floor wax.  Do not have throw rugs and other things on the floor that can make you trip. What can I do with my stairs?  Do not leave any items on the stairs.  Make sure that there are handrails on both sides of the stairs and use them. Fix handrails that are broken or loose. Make sure that handrails are as long as the stairways.  Check any carpeting to make sure that it is firmly attached to the stairs. Fix any carpet that is loose or worn.  Avoid having throw rugs at the top or bottom of the stairs. If you do have throw rugs, attach them to the floor with carpet tape.  Make sure that you have a light switch at the top of the stairs and the bottom of the stairs. If you do not have them, ask someone to add them for you. What else can I do to help prevent falls?  Wear shoes that:  Do not have high heels.  Have rubber bottoms.  Are comfortable and fit you well.  Are closed at the  toe. Do not wear sandals.  If you use a stepladder:  Make sure that it is fully opened. Do not climb a closed stepladder.  Make sure that both sides of the stepladder are locked into place.  Ask someone to hold it for you, if possible.  Clearly mark and make sure that you can see:  Any grab bars or handrails.  First and last steps.  Where the edge of each step is.  Use tools that help you move around (mobility aids) if they are needed. These include:  Canes.  Walkers.  Scooters.  Crutches.  Turn on the lights when you go into a dark area. Replace any light bulbs as soon as they burn out.  Set up your furniture so you have a clear path. Avoid moving your furniture around.  If any of your floors are uneven, fix them.  If there are any pets around you, be aware of where they are.  Review your medicines with your doctor. Some medicines can make you feel dizzy. This can increase your chance of falling. Ask your doctor what other things that you can do to help prevent falls. This information is not intended to replace advice given to you by your health care provider. Make sure you discuss any questions you have with your health care provider. Document Released: 07/18/2009 Document Revised: 02/27/2016 Document Reviewed: 10/26/2014 Elsevier Interactive Patient Education  2017 [employer].

## 2018-02-04 NOTE — Addendum Note (Signed)
Addended by: Avel Peace on: 02/04/2018 09:10 AM   Modules accepted: Orders

## 2018-02-04 NOTE — Progress Notes (Signed)
Subjective:   Clarence Maldonado is a 72 y.o. male who presents for Medicare Annual/Subsequent preventive examination.  Last AWV-02/01/2017    Objective:    Vitals: BP 128/70 (BP Location: Left Arm, Patient Position: Sitting)   Pulse 97   Temp 98.5 F (36.9 C) (Oral)   Ht 5\' 8"  (1.727 m)   Wt 177 lb (80.3 kg)   SpO2 92%   BMI 26.91 kg/m   Body mass index is 26.91 kg/m.  Advanced Directives 02/04/2018 02/03/2017 02/01/2017 01/29/2016  Does Patient Have a Medical Advance Directive? No No No No  Would patient like information on creating a medical advance directive? Yes (MAU/Ambulatory/Procedural Areas - Information given) - Yes (MAU/Ambulatory/Procedural Areas - Information given) Yes - Educational materials given    Tobacco Social History   Tobacco Use  Smoking Status Current Every Day Smoker  . Packs/day: 0.50  . Years: 60.00  . Pack years: 30.00  . Types: Cigarettes  Smokeless Tobacco Never Used  Tobacco Comment   Used to be 2 packs a day.     Ready to quit: Not Answered Counseling given: Not Answered Comment: Used to be 2 packs a day.   Clinical Intake:  Pre-visit preparation completed: No  Pain : No/denies pain     Nutritional Risks: None Diabetes: Yes CBG done?: No Did pt. bring in CBG monitor from home?: No  How often do you need to have someone help you when you read instructions, pamphlets, or other written materials from your doctor or pharmacy?: 1 - Never What is the last grade level you completed in school?: 8th grade  Interpreter Needed?: No  Information entered by :: Tyron Russell, RN  Past Medical History:  Diagnosis Date  . Abnormal CT of the chest 2009   nodule 9 x 6 mm RUL  . Benign localized hyperplasia of prostate with urinary obstruction and other lower urinary tract symptoms (LUTS)(600.21)   . Cardiomegaly   . Cerebrovascular accident (stroke) (HCC) 2009   lacunar infarct in the left thalamus  . Cholelithiasis   . Chronic airway  obstruction, not elsewhere classified   . Chronic kidney disease, stage III (moderate) (HCC)   . Closed femur fracture (HCC) 1960  . Illiterate   . Obesity, unspecified   . Other and unspecified hyperlipidemia   . Tobacco use disorder   . Type II or unspecified type diabetes mellitus with renal manifestations, uncontrolled(250.42)   . Unspecified essential hypertension    Past Surgical History:  Procedure Laterality Date  . FEMUR FRACTURE SURGERY Left 1960   Family History  Problem Relation Age of Onset  . Heart disease Mother    Social History   Socioeconomic History  . Marital status: Single    Spouse name: Not on file  . Number of children: Not on file  . Years of education: Not on file  . Highest education level: Not on file  Occupational History  . Not on file  Social Needs  . Financial resource strain: Not hard at all  . Food insecurity:    Worry: Never true    Inability: Never true  . Transportation needs:    Medical: No    Non-medical: No  Tobacco Use  . Smoking status: Current Every Day Smoker    Packs/day: 0.50    Years: 60.00    Pack years: 30.00    Types: Cigarettes  . Smokeless tobacco: Never Used  . Tobacco comment: Used to be 2 packs a day.  Substance  and Sexual Activity  . Alcohol use: Yes    Alcohol/week: 0.6 oz    Types: 1 Cans of beer per week  . Drug use: No  . Sexual activity: Not on file  Lifestyle  . Physical activity:    Days per week: 0 days    Minutes per session: 0 min  . Stress: Not at all  Relationships  . Social connections:    Talks on phone: Once a week    Gets together: Once a week    Attends religious service: Never    Active member of club or organization: No    Attends meetings of clubs or organizations: Never    Relationship status: Never married  Other Topics Concern  . Not on file  Social History Narrative  . Not on file    Outpatient Encounter Medications as of 02/04/2018  Medication Sig  . ADVAIR DISKUS  250-50 MCG/DOSE AEPB inhale 1 dose by mouth twice a day  . aspirin EC 81 MG tablet Take 81 mg by mouth 2 (two) times daily.  . diphenhydrAMINE (BENADRYL) 25 mg capsule One at bed if needed for sleep  . glimepiride (AMARYL) 1 MG tablet TAKE 1 TABLET BY MOUTH ONCE DAILY TO CONTROL BLOOD SUGAR  . lisinopril-hydrochlorothiazide (PRINZIDE,ZESTORETIC) 20-25 MG tablet TAKE 1 TABLET BY MOUTH ONCE DAILY TO CONTROL BLOOD PRESSURE  . lovastatin (MEVACOR) 20 MG tablet take 1 tablet by mouth once daily for cholesterol  . PROAIR HFA 108 (90 Base) MCG/ACT inhaler inhale 1 to 2 puffs every 4 to 6 hours if needed for BREATHING  . metoprolol succinate (TOPROL-XL) 25 MG 24 hr tablet Take 25 mg by mouth. Take 1/2 tablet once daily for blood pressure if needed.   No facility-administered encounter medications on file as of 02/04/2018.     Activities of Daily Living In your present state of health, do you have any difficulty performing the following activities: 02/04/2018  Hearing? Y  Vision? N  Difficulty concentrating or making decisions? Y  Walking or climbing stairs? Y  Dressing or bathing? N  Doing errands, shopping? N  Preparing Food and eating ? N  Using the Toilet? N  In the past six months, have you accidently leaked urine? N  Do you have problems with loss of bowel control? N  Managing your Medications? N  Managing your Finances? N  Housekeeping or managing your Housekeeping? N  Some recent data might be hidden    Patient Care Team: Sharon Seller, NP as PCP - General (Geriatric Medicine)   Assessment:   This is a routine wellness examination for Clarence Maldonado.  Exercise Activities and Dietary recommendations Current Exercise Habits: The patient does not participate in regular exercise at present, Exercise limited by: None identified  Goals    . Exercise 3x per week (30 min per time)     Starting 02/01/2017 I will use silver sneakers and go to the gym 2-3 times a week.       Fall Risk Fall  Risk  02/04/2018 02/01/2017 01/29/2016 01/09/2015 01/03/2014  Falls in the past year? No No No No No   Is the patient's home free of loose throw rugs in walkways, pet beds, electrical cords, etc?   yes      Grab bars in the bathroom? no      Handrails on the stairs?   yes      Adequate lighting?   yes   Depression Screen PHQ 2/9 Scores 02/04/2018 02/01/2017 01/29/2016 01/09/2015  PHQ - 2 Score 0 0 0 0    Cognitive Function MMSE - Mini Mental State Exam 02/04/2018 02/01/2017 01/29/2016  Not completed: Unable to complete - (No Data)  Orientation to time 2 1 2   Orientation to Place 3 3 4   Registration 3 3 3   Attention/ Calculation 0 0 0  Recall 0 0 0  Language- name 2 objects 2 2 2   Language- repeat 1 0 1  Language- follow 3 step command 3 3 3   Language- read & follow direction 0 0 0  Write a sentence 0 0 0  Copy design 1 0 0  Total score 15 12 15         Immunization History  Administered Date(s) Administered  . Influenza Split 06/22/2011  . Pneumococcal Conjugate-13 02/01/2017  . Pneumococcal Polysaccharide-23 01/11/2012  . Tdap 01/11/2012    Qualifies for Shingles Vaccine? Yes, educated and declined  Screening Tests Health Maintenance  Topic Date Due  . Hepatitis C Screening  1945/12/22  . OPHTHALMOLOGY EXAM  11/24/1956  . COLONOSCOPY  11/24/1996  . FOOT EXAM  01/09/2016  . HEMOGLOBIN A1C  08/03/2017  . INFLUENZA VACCINE  05/05/2018  . TETANUS/TDAP  01/10/2022  . PNA vac Low Risk Adult  Completed   Cancer Screenings: Lung: Low Dose CT Chest recommended if Age 71-80 years, 30 pack-year currently smoking OR have quit w/in 15years. Patient does qualify. Colorectal: due, declined  Additional Screenings:  Hepatitis C Screening:declined Diabetic eye exam referral sent      Plan:    I have personally reviewed and addressed the Medicare Annual Wellness questionnaire and have noted the following in the patient's chart:  A. Medical and social history B. Use of alcohol, tobacco  or illicit drugs  C. Current medications and supplements D. Functional ability and status E.  Nutritional status F.  Physical activity G. Advance directives H. List of other physicians I.  Hospitalizations, surgeries, and ER visits in previous 12 months J.  Vitals K. Screenings to include hearing, vision, cognitive, depression L. Referrals and appointments - none  In addition, I have reviewed and discussed with patient certain preventive protocols, quality metrics, and best practice recommendations. A written personalized care plan for preventive services as well as general preventive health recommendations were provided to patient.  See attached scanned questionnaire for additional information.   Signed,   Tyron Russell, RN Nurse Health Advisor  Patient Concerns: None

## 2018-02-04 NOTE — Addendum Note (Signed)
Addended by: Sueanne Margarita on: 02/04/2018 09:43 AM   Modules accepted: Orders

## 2018-02-05 LAB — HEMOGLOBIN A1C
Hgb A1c MFr Bld: 6.1 % of total Hgb — ABNORMAL HIGH (ref <5.7)
Mean Plasma Glucose: 128 (calc)
eAG (mmol/L): 7.1 (calc)

## 2018-02-05 LAB — MICROALBUMIN, URINE: MICROALB UR: 0.8 mg/dL

## 2018-02-07 ENCOUNTER — Other Ambulatory Visit: Payer: Self-pay | Admitting: Nurse Practitioner

## 2018-02-07 ENCOUNTER — Ambulatory Visit: Payer: Self-pay

## 2018-02-08 ENCOUNTER — Encounter: Payer: Medicare Other | Admitting: Nurse Practitioner

## 2018-02-10 ENCOUNTER — Encounter: Payer: Medicare Other | Admitting: Nurse Practitioner

## 2018-02-10 ENCOUNTER — Encounter: Payer: Medicare Other | Admitting: Internal Medicine

## 2018-02-10 ENCOUNTER — Other Ambulatory Visit: Payer: Self-pay | Admitting: *Deleted

## 2018-02-10 MED ORDER — FLUTICASONE-SALMETEROL 250-50 MCG/DOSE IN AEPB: INHALATION_SPRAY | RESPIRATORY_TRACT | 5 refills | Status: DC

## 2018-02-10 NOTE — Telephone Encounter (Signed)
[facility] 

## 2018-02-22 ENCOUNTER — Telehealth: Payer: Self-pay

## 2018-02-22 NOTE — Telephone Encounter (Signed)
I left a message for patient's POA, Clarence Maldonado, asking that he call the office due to needing to cancel upcoming CPE with Dr Renato Gails and rescheduling with Shanda Bumps. This patient was previously seen by Dr. Chilton Si and has not been seen by another provider at this office since then. He would need to be seen by Shanda Bumps due to Dr Renato Gails not taking any new patients at this time.

## 2018-02-22 NOTE — Telephone Encounter (Signed)
[name] called back and stated that he did not want to change patient to another provider. He insisted that he speak with our office manager, Aram Beecham.

## 2018-03-07 ENCOUNTER — Other Ambulatory Visit: Payer: Self-pay | Admitting: Internal Medicine

## 2018-03-21 ENCOUNTER — Encounter: Payer: Self-pay | Admitting: Internal Medicine

## 2018-03-21 ENCOUNTER — Ambulatory Visit (INDEPENDENT_AMBULATORY_CARE_PROVIDER_SITE_OTHER): Payer: Medicare Other | Admitting: Internal Medicine

## 2018-03-21 VITALS — BP 120/80 | HR 78 | Temp 98.5°F | Ht 68.0 in | Wt 173.0 lb

## 2018-03-21 DIAGNOSIS — Z550 Illiteracy and low-level literacy: Secondary | ICD-10-CM

## 2018-03-21 DIAGNOSIS — I10 Essential (primary) hypertension: Secondary | ICD-10-CM

## 2018-03-21 DIAGNOSIS — N400 Enlarged prostate without lower urinary tract symptoms: Secondary | ICD-10-CM | POA: Diagnosis not present

## 2018-03-21 DIAGNOSIS — F17200 Nicotine dependence, unspecified, uncomplicated: Secondary | ICD-10-CM

## 2018-03-21 DIAGNOSIS — R634 Abnormal weight loss: Secondary | ICD-10-CM

## 2018-03-21 DIAGNOSIS — Z1211 Encounter for screening for malignant neoplasm of colon: Secondary | ICD-10-CM | POA: Diagnosis not present

## 2018-03-21 DIAGNOSIS — E785 Hyperlipidemia, unspecified: Secondary | ICD-10-CM

## 2018-03-21 DIAGNOSIS — F5101 Primary insomnia: Secondary | ICD-10-CM

## 2018-03-21 DIAGNOSIS — E1122 Type 2 diabetes mellitus with diabetic chronic kidney disease: Secondary | ICD-10-CM | POA: Diagnosis not present

## 2018-03-21 DIAGNOSIS — Z716 Tobacco abuse counseling: Secondary | ICD-10-CM | POA: Diagnosis not present

## 2018-03-21 DIAGNOSIS — H5203 Hypermetropia, bilateral: Secondary | ICD-10-CM

## 2018-03-21 DIAGNOSIS — E1169 Type 2 diabetes mellitus with other specified complication: Secondary | ICD-10-CM

## 2018-03-21 DIAGNOSIS — N183 Chronic kidney disease, stage 3 unspecified: Secondary | ICD-10-CM

## 2018-03-21 MED ORDER — LISINOPRIL-HYDROCHLOROTHIAZIDE 20-25 MG PO TABS: 1.0000 | ORAL_TABLET | Freq: Every day | ORAL | 5 refills | Status: DC

## 2018-03-21 MED ORDER — BUPROPION HCL ER (XL) 150 MG PO TB24: 150.0000 mg | ORAL_TABLET | Freq: Every day | ORAL | 3 refills | Status: DC

## 2018-03-21 MED ORDER — LOVASTATIN 20 MG PO TABS: 20.0000 mg | ORAL_TABLET | Freq: Every day | ORAL | 5 refills | Status: DC

## 2018-03-21 MED ORDER — GLIMEPIRIDE 1 MG PO TABS: 1.0000 mg | ORAL_TABLET | Freq: Every day | ORAL | 5 refills | Status: DC

## 2018-03-21 NOTE — Progress Notes (Signed)
Location:  [facility] clinic Provider:  Shaniqwa Horsman L. Renato Gails, D.O., C.M.D.  Code Status: full code Goals of Care:  Advanced Directives 03/21/2018  Does Patient Have a Medical Advance Directive? No  Would patient like information on creating a medical advance directive? No - Patient declined   Chief Complaint  Patient presents with  . Medical Management of Chronic Issues    transfer from Dr. Chilton Si  . extended visit    HPI: Patient is a 72 y.o. male seen today for medical management of chronic diseases, extended visit.    He has a h/o RUL nodule in 2009, BPH with LUTS, cardiomegaly, lacunar infarct of his left thalamus, cholelithiasis, chronic airway obstruction, CKD3, prior left hip fx with surgery in 1960, illiteracy, obesity, tobacco use, DMII with renal manifestations uncontrolled, HTN.    He has a family h/o heart disease in his mother.    He smokes 1 pack over a week and drinks a beer a week of miller lite 16 oz.  Has at least a 60 pyh.  He is punching holes in the outside of his cigarettes so he gets less tar and nicotine.  He worked as a Nutritional therapist.    He's been checking his bp in the morning and his bp will run low in the 90s.  If it trends up to 134, then he takes the lisinopril/hctz.  He has lost 10 lbs since his last visit.  He watches what he eats.  He had stopped the toprol due to his bp dropping too low.    Says advair is the best--he takes it twice a day.  It works really well at opening his lungs.  He uses his albuterol each morning.  He says he's doing a lot better than a year ago when he was very weak.    He takes diphenhydramine to sleep--it does help him.  Sometimes it will take two hrs to go to sleep.  Denies any pain whatsoever.    DMII:  Has always taken amaryl as he remembers.  Doesn't eat that much sweets, but does have a little ice cream and chocolate chip cookies or cake.  He's able to see good long distances, but needs glasses for readings--says he still drives.    He  had prior CTs of his chest with a nodule in the RUL. He reports he had pain in that area and that went away.  Then his breathing got better.  [name] notes he gets out of breath easily.  He is interested in smoking cessation.    Kidneys are stable.  Cr 2.14.  Electrolytes, liver normal.  Fasting glucose 80.  Sugar average 6.1 so diabetes well controlled.  microalbumin was normal.    He refuses flu shots due to "catching the flu" afterwards.  Explained that that effect no longer occurs.    BPH:  Has some urinary hesitancy but otherwise ok.  No nocturia.  Sleeps through the night.   Past Medical History:  Diagnosis Date  . Abnormal CT of the chest 2009   nodule 9 x 6 mm RUL  . Benign localized hyperplasia of prostate with urinary obstruction and other lower urinary tract symptoms (LUTS)(600.21)   . Cardiomegaly   . Cerebrovascular accident (stroke) (HCC) 2009   lacunar infarct in the left thalamus  . Cholelithiasis   . Chronic airway obstruction, not elsewhere classified   . Chronic kidney disease, stage III (moderate) (HCC)   . Closed femur fracture (HCC) 1960  . Illiterate   .  Obesity, unspecified   . Other and unspecified hyperlipidemia   . Tobacco use disorder   . Type II or unspecified type diabetes mellitus with renal manifestations, uncontrolled(250.42)   . Unspecified essential hypertension     Past Surgical History:  Procedure Laterality Date  . FEMUR FRACTURE SURGERY Left 1960    No Known Allergies  Outpatient Encounter Medications as of 03/21/2018  Medication Sig  . aspirin EC 81 MG tablet Take 81 mg by mouth 2 (two) times daily.  . Fluticasone-Salmeterol (ADVAIR DISKUS) 250-50 MCG/DOSE AEPB inhale 1 dose by mouth twice a day  . glimepiride (AMARYL) 1 MG tablet TAKE 1 TABLET BY MOUTH ONCE DAILY TO CONTROL BLOOD SUGAR  . lisinopril-hydrochlorothiazide (PRINZIDE,ZESTORETIC) 20-25 MG tablet TAKE 1 TABLET BY MOUTH ONCE DAILY TO CONTROL BLOOD PRESSURE  . lovastatin (MEVACOR)  20 MG tablet TAKE 1 TABLET BY MOUTH ONCE DAILY FOR CHOLESTEROL  . metoprolol succinate (TOPROL-XL) 25 MG 24 hr tablet Take 25 mg by mouth. Take 1/2 tablet once daily for blood pressure if needed.  Marland Kitchen PROAIR HFA 108 (90 Base) MCG/ACT inhaler inhale 1 to 2 puffs every 4 to 6 hours if needed for BREATHING  . [DISCONTINUED] diphenhydrAMINE (BENADRYL) 25 mg capsule One at bed if needed for sleep   No facility-administered encounter medications on file as of 03/21/2018.     Review of Systems:  ROS  Health Maintenance  Topic Date Due  . Hepatitis C Screening  1945/10/30  . OPHTHALMOLOGY EXAM  11/24/1956  . COLONOSCOPY  11/24/1996  . FOOT EXAM  01/09/2016  . INFLUENZA VACCINE  05/05/2018  . HEMOGLOBIN A1C  08/07/2018  . TETANUS/TDAP  01/10/2022  . PNA vac Low Risk Adult  Completed    Physical Exam: Vitals:   03/21/18 0858  BP: 120/80  Pulse: 78  Temp: 98.5 F (36.9 C)  TempSrc: Oral  SpO2: 96%  Weight: 173 lb (78.5 kg)  Height: 5\' 8"  (1.727 m)   Body mass index is 26.3 kg/m. Physical Exam  Labs reviewed: Basic Metabolic Panel: Recent Labs    02/04/18 0954  NA 139  K 4.6  CL 101  CO2 28  GLUCOSE 80  BUN 33*  CREATININE 2.14*  CALCIUM 9.4   Liver Function Tests: Recent Labs    02/04/18 0954  AST 13  ALT 9  BILITOT 0.7  PROT 7.4   No results for input(s): LIPASE, AMYLASE in the last 8760 hours. No results for input(s): AMMONIA in the last 8760 hours. CBC: No results for input(s): WBC, NEUTROABS, HGB, HCT, MCV, PLT in the last 8760 hours. Lipid Panel: Recent Labs    02/04/18 0954  CHOL 135  HDL 33*  LDLCALC 81  TRIG 295  CHOLHDL 4.1   Lab Results  Component Value Date   HGBA1C 6.1 (H) 02/04/2018    Assessment/Plan 1. Controlled type 2 diabetes mellitus with stage 3 chronic kidney disease, without long-term current use of insulin (HCC) Dr. Dagoberto Ligas for diabetic eye exam (pt illiterate, but friend [name] will come with him--he sees Dr.  Dagoberto Ligas also Foot exam was done today - Ambulatory referral to Ophthalmology  2. CKD (chronic kidney disease) stage 3, GFR 30-59 ml/min (HCC) -has been stable -avoiding heat now -cont current bp regimen as he is taking it -Avoid nephrotoxic agents like nsaids, dose adjust renally excreted meds, hydrate.  3. Essential hypertension -bp at goal, drops low sometimes so delays taking medication until over 130 to take it -hydrate  4. Illiterate -notable and  does not understand well, has 4th grade education, Mr. [name] has been helping him (he used to work for [employer])  5. Tobacco use disorder -ongoing, but less than it used to be - will try him on wellbutrin to decrease urges and try to get him down at least to just 1 cigarette per day - buPROPion (WELLBUTRIN XL) 150 MG 24 hr tablet; Take 1 tablet (150 mg total) by mouth daily.  Dispense: 30 tablet; Refill: 3  6. Primary insomnia -he's been using benadryl for this otc per Dr. Thomasene Lot prior recommendation  7. BPH without obstruction/lower urinary tract symptoms -stable symptoms, no new complaints, not on medications for this  8. Hypermetropia of both eyes -reports difficulty with close-up and small print, but he's unable to read so testing that part will be challenging, but should at least get exam of posterior eye - Ambulatory referral to Ophthalmology, Dr. Dagoberto Ligas  9. Encounter for smoking cessation counseling - agrees to try wellbutrin - buPROPion (WELLBUTRIN XL) 150 MG 24 hr tablet; Take 1 tablet (150 mg total) by mouth daily.  Dispense: 30 tablet; Refill: 3  10. Weight loss -I'm concerned that this is related to the nodule he had in 2012 noted by previous PCP -weight is better now, but he is still smoking -discussed with pt and his friend and they do not want further evaluation--pt is happy and content as is--QOL is good--keeps saying he doesn't know why we want to make him sick when he feels good  11. Colon cancer  screening cologuard ordered--[name] will help him to do this--not sure this is going to happen, but we discussed it and will try  12.  Hyperlipidemia:  Due to diabetes, cont statin therapy at this point  Labs/tests ordered:  cologuard Orders Placed This Encounter  Procedures  . Ambulatory referral to Ophthalmology    Referral Priority:   Routine    Referral Type:   Consultation    Referral Reason:   Specialty Services Required    Requested Specialty:   Ophthalmology    Number of Visits Requested:   1   Next appt:  6 mos med mgt   Dee Paden L. Delani Kohli, D.O. Geriatrics [facility] [address], [place] [address] Cell Phone (Mon-Fri 8am-5pm):  [phone] On Call:  [phone] & follow prompts after 5pm & weekends Office Phone:  [phone] Office Fax:  [phone]

## 2018-03-21 NOTE — Patient Instructions (Addendum)
Try to cut down to 1 cigarette per day.  Ideally you should quit altogether.  I started you on wellbutrin to help you quit.  Let's check in in 6 months.    We'll check you for colon cancer with cologuard.  It's the poop in a bucket test.    Also, I'd like you to go see Dr. Dagoberto Ligas.

## 2018-08-02 ENCOUNTER — Other Ambulatory Visit: Payer: Self-pay | Admitting: Internal Medicine

## 2018-08-02 DIAGNOSIS — F17200 Nicotine dependence, unspecified, uncomplicated: Secondary | ICD-10-CM

## 2018-08-02 DIAGNOSIS — Z716 Tobacco abuse counseling: Secondary | ICD-10-CM

## 2018-09-03 ENCOUNTER — Other Ambulatory Visit: Payer: Self-pay | Admitting: Nurse Practitioner

## 2018-09-06 ENCOUNTER — Telehealth: Payer: Self-pay | Admitting: *Deleted

## 2018-09-06 NOTE — Telephone Encounter (Signed)
Received fax from [facility] stating that [employer] Diskus 250/42mcg is not covered by patient's plan. Stated that the preferred alternative is Symbicort Aer, Advair Disk UAER, Breo Ellitpa Inh.   Please Advise  [facility] [address] [phone] Fax: [phone]

## 2018-09-06 NOTE — Telephone Encounter (Signed)
Let's go with advair since it's a similar device so he should be able to use it easily.  Same directions.

## 2018-09-07 MED ORDER — FLUTICASONE-SALMETEROL 250-50 MCG/DOSE IN AEPB: 1.0000 | INHALATION_SPRAY | Freq: Two times a day (BID) | RESPIRATORY_TRACT | 3 refills | Status: DC

## 2018-09-07 NOTE — Telephone Encounter (Signed)
Medication sent back to pharmacy.

## 2018-09-22 ENCOUNTER — Ambulatory Visit: Payer: Medicare Other | Admitting: Internal Medicine

## 2018-10-04 ENCOUNTER — Other Ambulatory Visit: Payer: Self-pay | Admitting: Internal Medicine

## 2018-10-04 DIAGNOSIS — N183 Chronic kidney disease, stage 3 (moderate): Principal | ICD-10-CM

## 2018-10-04 DIAGNOSIS — E1122 Type 2 diabetes mellitus with diabetic chronic kidney disease: Secondary | ICD-10-CM

## 2018-11-03 ENCOUNTER — Other Ambulatory Visit: Payer: Self-pay | Admitting: Internal Medicine

## 2018-11-03 DIAGNOSIS — E785 Hyperlipidemia, unspecified: Principal | ICD-10-CM

## 2018-11-03 DIAGNOSIS — E1169 Type 2 diabetes mellitus with other specified complication: Secondary | ICD-10-CM

## 2018-11-22 ENCOUNTER — Encounter: Payer: Self-pay | Admitting: Family

## 2019-01-29 ENCOUNTER — Encounter: Payer: Self-pay | Admitting: Internal Medicine

## 2019-02-02 ENCOUNTER — Other Ambulatory Visit: Payer: Self-pay | Admitting: Internal Medicine

## 2019-02-02 DIAGNOSIS — Z716 Tobacco abuse counseling: Secondary | ICD-10-CM

## 2019-02-02 DIAGNOSIS — F17200 Nicotine dependence, unspecified, uncomplicated: Secondary | ICD-10-CM

## 2019-02-06 ENCOUNTER — Encounter: Payer: Medicare Other | Admitting: Family

## 2019-02-06 ENCOUNTER — Ambulatory Visit: Payer: Self-pay

## 2019-02-07 ENCOUNTER — Encounter: Payer: Medicare Other | Admitting: Family

## 2019-02-13 ENCOUNTER — Other Ambulatory Visit: Payer: Self-pay | Admitting: Nurse Practitioner

## 2019-03-23 ENCOUNTER — Ambulatory Visit (INDEPENDENT_AMBULATORY_CARE_PROVIDER_SITE_OTHER): Payer: Medicare Other | Admitting: Internal Medicine

## 2019-03-23 ENCOUNTER — Other Ambulatory Visit: Payer: Self-pay

## 2019-03-23 ENCOUNTER — Encounter: Payer: Self-pay | Admitting: Internal Medicine

## 2019-03-23 VITALS — BP 126/75 | HR 96 | Wt 163.0 lb

## 2019-03-23 DIAGNOSIS — E1122 Type 2 diabetes mellitus with diabetic chronic kidney disease: Secondary | ICD-10-CM | POA: Diagnosis not present

## 2019-03-23 DIAGNOSIS — E1169 Type 2 diabetes mellitus with other specified complication: Secondary | ICD-10-CM | POA: Diagnosis not present

## 2019-03-23 DIAGNOSIS — R634 Abnormal weight loss: Secondary | ICD-10-CM

## 2019-03-23 DIAGNOSIS — F17200 Nicotine dependence, unspecified, uncomplicated: Secondary | ICD-10-CM | POA: Diagnosis not present

## 2019-03-23 DIAGNOSIS — E785 Hyperlipidemia, unspecified: Secondary | ICD-10-CM | POA: Diagnosis not present

## 2019-03-23 DIAGNOSIS — N183 Chronic kidney disease, stage 3 unspecified: Secondary | ICD-10-CM

## 2019-03-23 DIAGNOSIS — I10 Essential (primary) hypertension: Secondary | ICD-10-CM

## 2019-03-23 NOTE — Progress Notes (Signed)
Patient ID: Clarence Maldonado, male   DOB: 12-31-1945, 73 y.o.   MRN: [id number] This service is provided via telemedicine  No vital signs collected/recorded due to the encounter was a telemedicine visit.   Location of patient (ex: home, work):  home  Patient consents to a telephone visit:  yes  Location of the provider (ex: office, home):  office  Name of any referring provider:  Bufford Spikes, DO  Names of all persons participating in the telemedicine service and their role in the encounter:  Patient, Clarence Maldonado, CMA, Clarence Spikes, DO  Time spent on call:  5:08    Provider:  Ayvah Caroll L. Renato Maldonado, D.O., C.M.D.  Goals of Care:  Advanced Directives 03/21/2018  Does Patient Have a Medical Advance Directive? No  Would patient like information on creating a medical advance directive? No - Patient declined   Chief Complaint  Patient presents with  . Medical Management of Chronic Issues    follow-up    HPI: Patient is a 73 y.o. male seen today for medical management of chronic diseases.    He checks his bp all the time.  When it gets down and stays down, he doesn't take any of the pills.    He eats one good meal a day. He feels good.  He's not hurting anywhere.  He's able to bend over and touch his toes.  He is afraid to go out anywhere right now.  He goes to the grocery himself--still drives.     He promises to let me know if his weight goes down.    He only smokes once in a while.  Today, he's only had one all day. He's bored.  Ain't nothing to do.  He's on wellbutrin to help him.  He says he needs something stronger.    He does not see blood in his stools.  If he knew something was wrong, he'd let us know.    Says he cannot go out in the sun b/c it affects his blood pressure.  He goes out if the sun is not shining.    Past Medical History:  Diagnosis Date  . Abnormal CT of the chest 2009   nodule 9 x 6 mm RUL  . Benign localized hyperplasia of prostate with urinary  obstruction and other lower urinary tract symptoms (LUTS)(600.21)   . Cardiomegaly   . Cerebrovascular accident (stroke) (HCC) 2009   lacunar infarct in the left thalamus  . Cholelithiasis   . Chronic airway obstruction, not elsewhere classified   . Chronic kidney disease, stage III (moderate) (HCC)   . Closed femur fracture (HCC) 1960  . Illiterate   . Obesity, unspecified   . Other and unspecified hyperlipidemia   . Tobacco use disorder   . Type II or unspecified type diabetes mellitus with renal manifestations, uncontrolled(250.42)   . Unspecified essential hypertension     Past Surgical History:  Procedure Laterality Date  . FEMUR FRACTURE SURGERY Left 1960    No Known Allergies  Outpatient Encounter Medications as of 03/23/2019  Medication Sig  . aspirin EC 81 MG tablet Take 81 mg by mouth 2 (two) times daily.  Marland Kitchen buPROPion (WELLBUTRIN XL) 150 MG 24 hr tablet TAKE 1 TABLET(150 MG) BY MOUTH DAILY  . Fluticasone-Salmeterol (ADVAIR DISKUS) 250-50 MCG/DOSE AEPB Inhale 1 puff into the lungs 2 (two) times daily.  Marland Kitchen glimepiride (AMARYL) 1 MG tablet TAKE 1 TABLET(1 MG) BY MOUTH DAILY WITH BREAKFAST  . lisinopril-hydrochlorothiazide (PRINZIDE,ZESTORETIC) 20-25 MG  tablet TAKE 1 TABLET BY MOUTH DAILY  . lovastatin (MEVACOR) 20 MG tablet TAKE 1 TABLET(20 MG) BY MOUTH DAILY AT 6 PM  . VENTOLIN HFA 108 (90 Base) MCG/ACT inhaler INHALATION 1-2 PUFFS EVERY 4 TO 6 HOURS IF NEEDED FOR BREATHING   No facility-administered encounter medications on file as of 03/23/2019.     Review of Systems:  Review of Systems  Constitutional: Positive for weight loss. Negative for chills, fever and malaise/fatigue.  HENT: Positive for hearing loss. Negative for congestion.   Eyes: Negative for blurred vision.  Respiratory: Negative for cough and shortness of breath.   Cardiovascular: Negative for chest pain, palpitations and leg swelling.  Gastrointestinal: Negative for abdominal pain, blood in stool,  constipation, diarrhea, heartburn, melena, nausea and vomiting.  Genitourinary: Negative for dysuria.  Musculoskeletal: Negative for falls and joint pain.  Skin: Negative for itching and rash.  Neurological: Negative for dizziness and loss of consciousness.       Gets dizzy and weak if he goes out in the sun which he blames on his medications  Endo/Heme/Allergies: Does not bruise/bleed easily.  Psychiatric/Behavioral: Negative for depression and memory loss. The patient is not nervous/anxious and does not have insomnia.        Illiterate    Health Maintenance  Topic Date Due  . Hepatitis C Screening  14-Feb-1946  . OPHTHALMOLOGY EXAM  11/24/1956  . COLONOSCOPY  11/24/1996  . HEMOGLOBIN A1C  08/07/2018  . FOOT EXAM  03/22/2019  . INFLUENZA VACCINE  05/06/2019  . TETANUS/TDAP  01/10/2022  . PNA vac Low Risk Adult  Completed    Physical Exam: Could not be performed as visit non face-to-face via phone   Labs reviewed: Basic Metabolic Panel: No results for input(s): NA, K, CL, CO2, GLUCOSE, BUN, CREATININE, CALCIUM, MG, PHOS, TSH in the last 8760 hours. Liver Function Tests: No results for input(s): AST, ALT, ALKPHOS, BILITOT, PROT, ALBUMIN in the last 8760 hours. No results for input(s): LIPASE, AMYLASE in the last 8760 hours. No results for input(s): AMMONIA in the last 8760 hours. CBC: No results for input(s): WBC, NEUTROABS, HGB, HCT, MCV, PLT in the last 8760 hours. Lipid Panel: No results for input(s): CHOL, HDL, LDLCALC, TRIG, CHOLHDL, LDLDIRECT in the last 8760 hours. Lab Results  Component Value Date   HGBA1C 6.1 (H) 02/04/2018    Procedures since last visit: Never did cologuard  Assessment/Plan 1. Controlled type 2 diabetes mellitus with stage 3 chronic kidney disease, without long-term current use of insulin (HCC) - need up to date hba1c - COMPLETE METABOLIC PANEL WITH GFR; Future - CBC with Differential/Platelet; Future - Hemoglobin A1c; Future  2. CKD  (chronic kidney disease) stage 3, GFR 30-59 ml/min (HCC) -Avoid nephrotoxic agents like nsaids, dose adjust renally excreted meds, hydrate. - COMPLETE METABOLIC PANEL WITH GFR; Future - CBC with Differential/Platelet; Future  3. Essential hypertension -bp controlled with current regimen  4. Tobacco use disorder -continues to smoke even with wellbutrin use -encouraged gradual cessation  5. Hyperlipidemia associated with type 2 diabetes mellitus (HCC) - cont statin therapy - COMPLETE METABOLIC PANEL WITH GFR; Future - Lipid panel; Future  6. Weight loss - wanted to check cologuard b/c he refused cscope, but he never did it - TSH; Future    Labs/tests ordered:   Lab Orders     COMPLETE METABOLIC PANEL WITH GFR     CBC with Differential/Platelet     Hemoglobin A1c     Lipid panel  TSH  Next appt:  PRN  Non face-to-face time spent on televisit:  26 mins  Sanvi Ehler L. Robertta Halfhill, D.O. Geriatrics [facility] [address] [place] [place], [place] [address] Cell Phone (Mon-Fri 8am-5pm):  [phone] On Call:  [phone] & follow prompts after 5pm & weekends Office Phone:  [phone] Office Fax:  [phone]

## 2019-03-23 NOTE — Patient Instructions (Addendum)
Please come in to get your lab tests done so we can check on your diabetes, kidneys and blood counts.    Continue to try to cut down on those cigarettes.  Let me know if you continue to lose weight.  You're at a good weight now and we don't really want you to lose more.

## 2019-04-03 ENCOUNTER — Other Ambulatory Visit: Payer: Self-pay

## 2019-04-03 DIAGNOSIS — E1169 Type 2 diabetes mellitus with other specified complication: Secondary | ICD-10-CM

## 2019-04-03 DIAGNOSIS — E785 Hyperlipidemia, unspecified: Secondary | ICD-10-CM | POA: Diagnosis not present

## 2019-04-03 DIAGNOSIS — N183 Chronic kidney disease, stage 3 unspecified: Secondary | ICD-10-CM

## 2019-04-03 DIAGNOSIS — E1122 Type 2 diabetes mellitus with diabetic chronic kidney disease: Secondary | ICD-10-CM

## 2019-04-03 DIAGNOSIS — R634 Abnormal weight loss: Secondary | ICD-10-CM

## 2019-04-04 LAB — CBC WITH DIFFERENTIAL/PLATELET
Absolute Monocytes: 1015 cells/uL — ABNORMAL HIGH (ref 200–950)
Basophils Absolute: 94 cells/uL (ref 0–200)
Basophils Relative: 0.8 %
Eosinophils Absolute: 283 cells/uL (ref 15–500)
Eosinophils Relative: 2.4 %
HCT: 43.6 % (ref 38.5–50.0)
Hemoglobin: 13.8 g/dL (ref 13.2–17.1)
Lymphs Abs: 1274 cells/uL (ref 850–3900)
MCH: 24.4 pg — ABNORMAL LOW (ref 27.0–33.0)
MCHC: 31.7 g/dL — ABNORMAL LOW (ref 32.0–36.0)
MCV: 77 fL — ABNORMAL LOW (ref 80.0–100.0)
MPV: 9 fL (ref 7.5–12.5)
Monocytes Relative: 8.6 %
Neutro Abs: 9133 cells/uL — ABNORMAL HIGH (ref 1500–7800)
Neutrophils Relative %: 77.4 %
Platelets: 437 10*3/uL — ABNORMAL HIGH (ref 140–400)
RBC: 5.66 10*6/uL (ref 4.20–5.80)
RDW: 15.4 % — ABNORMAL HIGH (ref 11.0–15.0)
Total Lymphocyte: 10.8 %
WBC: 11.8 10*3/uL — ABNORMAL HIGH (ref 3.8–10.8)

## 2019-04-04 LAB — COMPLETE METABOLIC PANEL WITH GFR
AG Ratio: 0.9 (calc) — ABNORMAL LOW (ref 1.0–2.5)
ALT: 12 U/L (ref 9–46)
AST: 16 U/L (ref 10–35)
Albumin: 3.7 g/dL (ref 3.6–5.1)
Alkaline phosphatase (APISO): 85 U/L (ref 35–144)
BUN/Creatinine Ratio: 20 (calc) (ref 6–22)
BUN: 35 mg/dL — ABNORMAL HIGH (ref 7–25)
CO2: 26 mmol/L (ref 20–32)
Calcium: 9.6 mg/dL (ref 8.6–10.3)
Chloride: 101 mmol/L (ref 98–110)
Creat: 1.77 mg/dL — ABNORMAL HIGH (ref 0.70–1.18)
GFR, Est African American: 44 mL/min/{1.73_m2} — ABNORMAL LOW (ref >=60)
GFR, Est Non African American: 38 mL/min/{1.73_m2} — ABNORMAL LOW (ref >=60)
Globulin: 4 g/dL (calc) — ABNORMAL HIGH (ref 1.9–3.7)
Glucose, Bld: 94 mg/dL (ref 65–99)
Potassium: 4.3 mmol/L (ref 3.5–5.3)
Sodium: 139 mmol/L (ref 135–146)
Total Bilirubin: 0.6 mg/dL (ref 0.2–1.2)
Total Protein: 7.7 g/dL (ref 6.1–8.1)

## 2019-04-04 LAB — HEMOGLOBIN A1C
Hgb A1c MFr Bld: 6.2 % of total Hgb — ABNORMAL HIGH (ref <5.7)
Mean Plasma Glucose: 131 (calc)
eAG (mmol/L): 7.3 (calc)

## 2019-04-04 LAB — LIPID PANEL
Cholesterol: 138 mg/dL (ref <200)
HDL: 36 mg/dL — ABNORMAL LOW (ref >=40)
LDL Cholesterol (Calc): 84 mg/dL (calc)
Non-HDL Cholesterol (Calc): 102 mg/dL (calc) (ref <130)
Total CHOL/HDL Ratio: 3.8 (calc) (ref <5.0)
Triglycerides: 89 mg/dL (ref <150)

## 2019-04-04 LAB — TSH: TSH: 0.65 mIU/L (ref 0.40–4.50)

## 2019-04-04 NOTE — Progress Notes (Signed)
This encounter was created in error - please disregard.

## 2019-04-12 ENCOUNTER — Other Ambulatory Visit: Payer: Self-pay | Admitting: Internal Medicine

## 2019-04-12 DIAGNOSIS — E1122 Type 2 diabetes mellitus with diabetic chronic kidney disease: Secondary | ICD-10-CM

## 2019-04-12 DIAGNOSIS — E785 Hyperlipidemia, unspecified: Secondary | ICD-10-CM

## 2019-04-12 DIAGNOSIS — E1169 Type 2 diabetes mellitus with other specified complication: Secondary | ICD-10-CM

## 2019-04-25 ENCOUNTER — Other Ambulatory Visit: Payer: Self-pay | Admitting: Internal Medicine

## 2019-05-08 ENCOUNTER — Other Ambulatory Visit: Payer: Self-pay | Admitting: Internal Medicine

## 2019-05-15 ENCOUNTER — Emergency Department (HOSPITAL_COMMUNITY): Payer: Medicare Other

## 2019-05-15 ENCOUNTER — Other Ambulatory Visit: Payer: Self-pay

## 2019-05-15 ENCOUNTER — Inpatient Hospital Stay (HOSPITAL_COMMUNITY)
Admission: EM | Admit: 2019-05-15 | Discharge: 2019-07-25 | DRG: 177 | Disposition: A | Discharge status: Home Health | Payer: Medicare Other | Attending: Family Medicine | Admitting: Family Medicine

## 2019-05-15 ENCOUNTER — Ambulatory Visit: Payer: Medicare Other | Admitting: Internal Medicine

## 2019-05-15 ENCOUNTER — Encounter (HOSPITAL_COMMUNITY): Payer: Self-pay

## 2019-05-15 DIAGNOSIS — G6289 Other specified polyneuropathies: Secondary | ICD-10-CM | POA: Diagnosis not present

## 2019-05-15 DIAGNOSIS — E1165 Type 2 diabetes mellitus with hyperglycemia: Secondary | ICD-10-CM | POA: Diagnosis not present

## 2019-05-15 DIAGNOSIS — J9601 Acute respiratory failure with hypoxia: Secondary | ICD-10-CM | POA: Diagnosis not present

## 2019-05-15 DIAGNOSIS — J984 Other disorders of lung: Secondary | ICD-10-CM | POA: Diagnosis not present

## 2019-05-15 DIAGNOSIS — D508 Other iron deficiency anemias: Secondary | ICD-10-CM | POA: Diagnosis not present

## 2019-05-15 DIAGNOSIS — I5032 Chronic diastolic (congestive) heart failure: Secondary | ICD-10-CM | POA: Diagnosis not present

## 2019-05-15 DIAGNOSIS — E440 Moderate protein-calorie malnutrition: Secondary | ICD-10-CM | POA: Diagnosis present

## 2019-05-15 DIAGNOSIS — R634 Abnormal weight loss: Secondary | ICD-10-CM | POA: Diagnosis not present

## 2019-05-15 DIAGNOSIS — I371 Nonrheumatic pulmonary valve insufficiency: Secondary | ICD-10-CM | POA: Diagnosis not present

## 2019-05-15 DIAGNOSIS — I472 Ventricular tachycardia: Secondary | ICD-10-CM | POA: Diagnosis not present

## 2019-05-15 DIAGNOSIS — R627 Adult failure to thrive: Secondary | ICD-10-CM | POA: Diagnosis present

## 2019-05-15 DIAGNOSIS — J159 Unspecified bacterial pneumonia: Secondary | ICD-10-CM | POA: Diagnosis present

## 2019-05-15 DIAGNOSIS — R1311 Dysphagia, oral phase: Secondary | ICD-10-CM | POA: Diagnosis not present

## 2019-05-15 DIAGNOSIS — R609 Edema, unspecified: Secondary | ICD-10-CM | POA: Diagnosis not present

## 2019-05-15 DIAGNOSIS — E1169 Type 2 diabetes mellitus with other specified complication: Secondary | ICD-10-CM | POA: Diagnosis present

## 2019-05-15 DIAGNOSIS — E1122 Type 2 diabetes mellitus with diabetic chronic kidney disease: Secondary | ICD-10-CM | POA: Diagnosis present

## 2019-05-15 DIAGNOSIS — G9341 Metabolic encephalopathy: Secondary | ICD-10-CM | POA: Diagnosis present

## 2019-05-15 DIAGNOSIS — S0990XA Unspecified injury of head, initial encounter: Secondary | ICD-10-CM | POA: Diagnosis not present

## 2019-05-15 DIAGNOSIS — R05 Cough: Secondary | ICD-10-CM | POA: Diagnosis not present

## 2019-05-15 DIAGNOSIS — N401 Enlarged prostate with lower urinary tract symptoms: Secondary | ICD-10-CM | POA: Diagnosis not present

## 2019-05-15 DIAGNOSIS — W19XXXA Unspecified fall, initial encounter: Secondary | ICD-10-CM | POA: Diagnosis not present

## 2019-05-15 DIAGNOSIS — N179 Acute kidney failure, unspecified: Secondary | ICD-10-CM | POA: Diagnosis not present

## 2019-05-15 DIAGNOSIS — N183 Chronic kidney disease, stage 3 unspecified: Secondary | ICD-10-CM | POA: Diagnosis present

## 2019-05-15 DIAGNOSIS — R5383 Other fatigue: Secondary | ICD-10-CM | POA: Diagnosis not present

## 2019-05-15 DIAGNOSIS — N390 Urinary tract infection, site not specified: Secondary | ICD-10-CM

## 2019-05-15 DIAGNOSIS — R918 Other nonspecific abnormal finding of lung field: Secondary | ICD-10-CM | POA: Diagnosis not present

## 2019-05-15 DIAGNOSIS — E1142 Type 2 diabetes mellitus with diabetic polyneuropathy: Secondary | ICD-10-CM | POA: Diagnosis not present

## 2019-05-15 DIAGNOSIS — A159 Respiratory tuberculosis unspecified: Secondary | ICD-10-CM | POA: Diagnosis not present

## 2019-05-15 DIAGNOSIS — Z7401 Bed confinement status: Secondary | ICD-10-CM | POA: Diagnosis not present

## 2019-05-15 DIAGNOSIS — R0902 Hypoxemia: Secondary | ICD-10-CM | POA: Diagnosis not present

## 2019-05-15 DIAGNOSIS — E871 Hypo-osmolality and hyponatremia: Secondary | ICD-10-CM | POA: Diagnosis not present

## 2019-05-15 DIAGNOSIS — F17210 Nicotine dependence, cigarettes, uncomplicated: Secondary | ICD-10-CM | POA: Diagnosis present

## 2019-05-15 DIAGNOSIS — T502X5A Adverse effect of carbonic-anhydrase inhibitors, benzothiadiazides and other diuretics, initial encounter: Secondary | ICD-10-CM | POA: Diagnosis not present

## 2019-05-15 DIAGNOSIS — I10 Essential (primary) hypertension: Secondary | ICD-10-CM | POA: Diagnosis not present

## 2019-05-15 DIAGNOSIS — D509 Iron deficiency anemia, unspecified: Secondary | ICD-10-CM | POA: Diagnosis present

## 2019-05-15 DIAGNOSIS — E876 Hypokalemia: Secondary | ICD-10-CM | POA: Diagnosis not present

## 2019-05-15 DIAGNOSIS — J690 Pneumonitis due to inhalation of food and vomit: Secondary | ICD-10-CM | POA: Diagnosis not present

## 2019-05-15 DIAGNOSIS — E7849 Other hyperlipidemia: Secondary | ICD-10-CM | POA: Diagnosis not present

## 2019-05-15 DIAGNOSIS — Z8673 Personal history of transient ischemic attack (TIA), and cerebral infarction without residual deficits: Secondary | ICD-10-CM

## 2019-05-15 DIAGNOSIS — R531 Weakness: Secondary | ICD-10-CM | POA: Diagnosis not present

## 2019-05-15 DIAGNOSIS — E11649 Type 2 diabetes mellitus with hypoglycemia without coma: Secondary | ICD-10-CM | POA: Diagnosis not present

## 2019-05-15 DIAGNOSIS — Z7982 Long term (current) use of aspirin: Secondary | ICD-10-CM

## 2019-05-15 DIAGNOSIS — J189 Pneumonia, unspecified organism: Secondary | ICD-10-CM | POA: Diagnosis not present

## 2019-05-15 DIAGNOSIS — Y92009 Unspecified place in unspecified non-institutional (private) residence as the place of occurrence of the external cause: Secondary | ICD-10-CM | POA: Diagnosis not present

## 2019-05-15 DIAGNOSIS — T17908A Unspecified foreign body in respiratory tract, part unspecified causing other injury, initial encounter: Secondary | ICD-10-CM | POA: Diagnosis not present

## 2019-05-15 DIAGNOSIS — K449 Diaphragmatic hernia without obstruction or gangrene: Secondary | ICD-10-CM | POA: Diagnosis not present

## 2019-05-15 DIAGNOSIS — R509 Fever, unspecified: Secondary | ICD-10-CM | POA: Diagnosis not present

## 2019-05-15 DIAGNOSIS — A150 Tuberculosis of lung: Secondary | ICD-10-CM | POA: Diagnosis not present

## 2019-05-15 DIAGNOSIS — I351 Nonrheumatic aortic (valve) insufficiency: Secondary | ICD-10-CM | POA: Diagnosis present

## 2019-05-15 DIAGNOSIS — I130 Hypertensive heart and chronic kidney disease with heart failure and stage 1 through stage 4 chronic kidney disease, or unspecified chronic kidney disease: Secondary | ICD-10-CM | POA: Diagnosis present

## 2019-05-15 DIAGNOSIS — N1831 Chronic kidney disease, stage 3a: Secondary | ICD-10-CM | POA: Diagnosis not present

## 2019-05-15 DIAGNOSIS — L270 Generalized skin eruption due to drugs and medicaments taken internally: Secondary | ICD-10-CM | POA: Diagnosis not present

## 2019-05-15 DIAGNOSIS — G4700 Insomnia, unspecified: Secondary | ICD-10-CM | POA: Diagnosis not present

## 2019-05-15 DIAGNOSIS — Z7951 Long term (current) use of inhaled steroids: Secondary | ICD-10-CM

## 2019-05-15 DIAGNOSIS — I361 Nonrheumatic tricuspid (valve) insufficiency: Secondary | ICD-10-CM | POA: Diagnosis not present

## 2019-05-15 DIAGNOSIS — R0602 Shortness of breath: Secondary | ICD-10-CM

## 2019-05-15 DIAGNOSIS — E785 Hyperlipidemia, unspecified: Secondary | ICD-10-CM | POA: Diagnosis present

## 2019-05-15 DIAGNOSIS — F17200 Nicotine dependence, unspecified, uncomplicated: Secondary | ICD-10-CM | POA: Diagnosis present

## 2019-05-15 DIAGNOSIS — R64 Cachexia: Secondary | ICD-10-CM | POA: Diagnosis not present

## 2019-05-15 DIAGNOSIS — R1310 Dysphagia, unspecified: Secondary | ICD-10-CM | POA: Diagnosis not present

## 2019-05-15 DIAGNOSIS — R1312 Dysphagia, oropharyngeal phase: Secondary | ICD-10-CM | POA: Diagnosis not present

## 2019-05-15 DIAGNOSIS — Z79899 Other long term (current) drug therapy: Secondary | ICD-10-CM

## 2019-05-15 DIAGNOSIS — D649 Anemia, unspecified: Secondary | ICD-10-CM | POA: Diagnosis present

## 2019-05-15 DIAGNOSIS — D631 Anemia in chronic kidney disease: Secondary | ICD-10-CM | POA: Diagnosis present

## 2019-05-15 DIAGNOSIS — Z681 Body mass index (BMI) 19 or less, adult: Secondary | ICD-10-CM | POA: Diagnosis not present

## 2019-05-15 DIAGNOSIS — J440 Chronic obstructive pulmonary disease with acute lower respiratory infection: Secondary | ICD-10-CM | POA: Diagnosis not present

## 2019-05-15 DIAGNOSIS — K219 Gastro-esophageal reflux disease without esophagitis: Secondary | ICD-10-CM | POA: Diagnosis present

## 2019-05-15 DIAGNOSIS — K30 Functional dyspepsia: Secondary | ICD-10-CM | POA: Diagnosis not present

## 2019-05-15 DIAGNOSIS — Z7984 Long term (current) use of oral hypoglycemic drugs: Secondary | ICD-10-CM

## 2019-05-15 DIAGNOSIS — M2550 Pain in unspecified joint: Secondary | ICD-10-CM | POA: Diagnosis not present

## 2019-05-15 DIAGNOSIS — R1319 Other dysphagia: Secondary | ICD-10-CM

## 2019-05-15 DIAGNOSIS — R55 Syncope and collapse: Secondary | ICD-10-CM | POA: Diagnosis not present

## 2019-05-15 DIAGNOSIS — R21 Rash and other nonspecific skin eruption: Secondary | ICD-10-CM | POA: Diagnosis not present

## 2019-05-15 DIAGNOSIS — Z9181 History of falling: Secondary | ICD-10-CM

## 2019-05-15 DIAGNOSIS — Z8249 Family history of ischemic heart disease and other diseases of the circulatory system: Secondary | ICD-10-CM

## 2019-05-15 DIAGNOSIS — Z20828 Contact with and (suspected) exposure to other viral communicable diseases: Secondary | ICD-10-CM | POA: Diagnosis present

## 2019-05-15 DIAGNOSIS — R4182 Altered mental status, unspecified: Secondary | ICD-10-CM

## 2019-05-15 DIAGNOSIS — R5381 Other malaise: Secondary | ICD-10-CM | POA: Diagnosis not present

## 2019-05-15 DIAGNOSIS — R296 Repeated falls: Secondary | ICD-10-CM | POA: Diagnosis present

## 2019-05-15 DIAGNOSIS — Z6822 Body mass index (BMI) 22.0-22.9, adult: Secondary | ICD-10-CM | POA: Diagnosis not present

## 2019-05-15 DIAGNOSIS — R000 Tachycardia, unspecified: Secondary | ICD-10-CM | POA: Diagnosis not present

## 2019-05-15 DIAGNOSIS — R070 Pain in throat: Secondary | ICD-10-CM | POA: Diagnosis not present

## 2019-05-15 LAB — CBC WITH DIFFERENTIAL/PLATELET
Abs Immature Granulocytes: 0.03 10*3/uL (ref 0.00–0.07)
Basophils Absolute: 0 10*3/uL (ref 0.0–0.1)
Basophils Relative: 0 %
Eosinophils Absolute: 0 10*3/uL (ref 0.0–0.5)
Eosinophils Relative: 0 %
HCT: 31.8 % — ABNORMAL LOW (ref 39.0–52.0)
Hemoglobin: 9.4 g/dL — ABNORMAL LOW (ref 13.0–17.0)
Immature Granulocytes: 1 %
Lymphocytes Relative: 9 %
Lymphs Abs: 0.6 10*3/uL — ABNORMAL LOW (ref 0.7–4.0)
MCH: 22.7 pg — ABNORMAL LOW (ref 26.0–34.0)
MCHC: 29.6 g/dL — ABNORMAL LOW (ref 30.0–36.0)
MCV: 76.6 fL — ABNORMAL LOW (ref 80.0–100.0)
Monocytes Absolute: 0.3 10*3/uL (ref 0.1–1.0)
Monocytes Relative: 5 %
Neutro Abs: 5.4 10*3/uL (ref 1.7–7.7)
Neutrophils Relative %: 85 %
Platelets: 240 10*3/uL (ref 150–400)
RBC: 4.15 MIL/uL — ABNORMAL LOW (ref 4.22–5.81)
RDW: 16.6 % — ABNORMAL HIGH (ref 11.5–15.5)
WBC: 6.3 10*3/uL (ref 4.0–10.5)
nRBC: 0 % (ref 0.0–0.2)

## 2019-05-15 LAB — COMPREHENSIVE METABOLIC PANEL
ALT: 33 U/L (ref 0–44)
AST: 87 U/L — ABNORMAL HIGH (ref 15–41)
Albumin: 2.1 g/dL — ABNORMAL LOW (ref 3.5–5.0)
Alkaline Phosphatase: 123 U/L (ref 38–126)
Anion gap: 11 (ref 5–15)
BUN: 27 mg/dL — ABNORMAL HIGH (ref 8–23)
CO2: 23 mmol/L (ref 22–32)
Calcium: 8 mg/dL — ABNORMAL LOW (ref 8.9–10.3)
Chloride: 98 mmol/L (ref 98–111)
Creatinine, Ser: 1.55 mg/dL — ABNORMAL HIGH (ref 0.61–1.24)
GFR calc Af Amer: 51 mL/min — ABNORMAL LOW (ref >60)
GFR calc non Af Amer: 44 mL/min — ABNORMAL LOW (ref >60)
Glucose, Bld: 135 mg/dL — ABNORMAL HIGH (ref 70–99)
Potassium: 2.6 mmol/L — CL (ref 3.5–5.1)
Sodium: 132 mmol/L — ABNORMAL LOW (ref 135–145)
Total Bilirubin: 0.9 mg/dL (ref 0.3–1.2)
Total Protein: 6.1 g/dL — ABNORMAL LOW (ref 6.5–8.1)

## 2019-05-15 LAB — MAGNESIUM: Magnesium: 1.9 mg/dL (ref 1.7–2.4)

## 2019-05-15 LAB — POC OCCULT BLOOD, ED: Fecal Occult Bld: NEGATIVE

## 2019-05-15 LAB — SARS CORONAVIRUS 2 BY RT PCR (HOSPITAL ORDER, PERFORMED IN ~~LOC~~ HOSPITAL LAB): SARS Coronavirus 2: NEGATIVE

## 2019-05-15 MED ORDER — IOHEXOL 300 MG/ML  SOLN
75.0000 mL | Freq: Once | INTRAMUSCULAR | Status: AC | PRN
  Administered 2019-05-15: 60 mL via INTRAVENOUS

## 2019-05-15 MED ORDER — SODIUM CHLORIDE 0.9 % IV BOLUS
1000.0000 mL | Freq: Once | INTRAVENOUS | Status: AC
  Administered 2019-05-15: 1000 mL via INTRAVENOUS

## 2019-05-15 MED ORDER — SODIUM CHLORIDE (PF) 0.9 % IJ SOLN
INTRAMUSCULAR | Status: AC
  Administered 2019-05-16: 06:00:00
  Filled 2019-05-15: qty 50

## 2019-05-15 MED ORDER — POTASSIUM CHLORIDE 10 MEQ/100ML IV SOLN
10.0000 meq | INTRAVENOUS | Status: AC
  Administered 2019-05-16 (×4): 10 meq via INTRAVENOUS
  Filled 2019-05-15 (×4): qty 100

## 2019-05-15 MED ORDER — POTASSIUM CHLORIDE 10 MEQ/100ML IV SOLN
10.0000 meq | Freq: Once | INTRAVENOUS | Status: AC
  Administered 2019-05-15: 10 meq via INTRAVENOUS
  Filled 2019-05-15: qty 100

## 2019-05-15 NOTE — H&P (Signed)
Clarence Maldonado:[id number] DOB: [birth year] DOA: 05/15/2019     PCP: [name], DO   Outpatient Specialists:   NONE    Patient arrived to ER on 05/15/19 at 1539  Patient coming from: home Lives alone,     Chief Complaint:  Chief Complaint  Patient presents with   Failure To Thrive   Fatigue    HPI: Clarence Maldonado is a 73 y.o. male with medical history significant of cardiomegaly, stroke, COPD,   CKD stage III, tobacco abuse, DM 2, HTN,    Presented with aggressive weakness to the point where patient started to fall over.  He lives at home by himself and has been progressively getting worse over the past 3 months Finally today his friend called EMS She does not endorse any fevers headache has had decreased appetite he just feels weak and often falls denies any dysuria he coughs sometimes with productive of sputum Smokes only occasionally. does not drink, Denies any hx of Tb, no exposure to Tb no [employer] service not homeless, no hx of incarceration   Infectious risk factors:  Reports shortness of breath,   Cough,  fatigue     In  ER RAPID COVID TEST NEGATIVE    Regarding pertinent Chronic problems:    Hyperlipidemia -  on statins Mevacor   HTN on Prinzide     DM 2 -  Lab Results  Component Value Date   HGBA1C 6.2 (H) 04/03/2019   PO meds only,        COPD on Advair      CKD stage III - baseline Cr 1.4   While in ER: Found to have hyponatremia potassium down to 2.6 And appears to be anemic with hemoglobin down to 9.4 The following Work up has been ordered so far:  Orders Placed This Encounter  Procedures   SARS Coronavirus 2 [facility] order, Performed in [facility] hospital lab) Nasopharyngeal Nasopharyngeal Swab   Acid Fast Smear (AFB)   Acid Fast Culture with reflexed sensitivities   DG Chest 2 View   CT Chest W Contrast   Comprehensive metabolic panel   CBC with Differential   Urinalysis, Routine w reflex microscopic    Magnesium   CK   Phosphorus   Vitamin B12   Folate   Iron and TIBC   Ferritin   Reticulocytes   Occult blood card to lab, stool RN will collect   QuantiFERON-TB Gold Plus   Prealbumin   Cardiac monitoring   Consult to hospitalist  ALL PATIENTS BEING ADMITTED/HAVING PROCEDURES NEED COVID-19 SCREENING   Consult to hospitalist  ALL PATIENTS BEING ADMITTED/HAVING PROCEDURES NEED COVID-19 SCREENING   Consult to dietitian   Airborne and Contact precautions   POC occult blood, ED   ED EKG   EKG 12-Lead   EKG 12-Lead   ECHOCARDIOGRAM COMPLETE   Admit to Inpatient (patient's expected length of stay will be greater than 2 midnights or inpatient only procedure)     Following Medications were ordered in ER: Medications  sodium chloride (PF) 0.9 % injection (has no administration in time range)  potassium chloride 10 mEq in 100 mL IVPB (has no administration in time range)  sodium chloride 0.9 % bolus 1,000 mL (0 mLs Intravenous Stopped 05/15/19 1853)  potassium chloride 10 mEq in 100 mL IVPB (0 mEq Intravenous Stopped 05/15/19 1957)  iohexol (OMNIPAQUE) 300 MG/ML solution 75 mL (60 mLs Intravenous Contrast Given 05/15/19 2004)  Consult Orders  (From admission, onward)         Start     Ordered   05/15/19 2143  Consult to dietitian  Once    Provider:  (Not yet assigned)  Question:  Reason for consult?  Answer:  Assessment of nutrition requirements/status   05/15/19 2143   05/15/19 2103  Consult to hospitalist  ALL PATIENTS BEING ADMITTED/HAVING PROCEDURES NEED COVID-19 SCREENING  Once    Comments: ALL PATIENTS BEING ADMITTED/HAVING PROCEDURES NEED COVID-19 SCREENING  Provider:  (Not yet assigned)  Question Answer Comment  Place call to: [facility]   Reason for Consult Admit      05/15/19 2102   05/15/19 2101  Consult to hospitalist  ALL PATIENTS BEING ADMITTED/HAVING PROCEDURES NEED COVID-19 SCREENING  Once    Comments: ALL PATIENTS BEING  ADMITTED/HAVING PROCEDURES NEED COVID-19 SCREENING  Provider:  (Not yet assigned)  Question Answer Comment  Place call to: [facility]   Reason for Consult Admit      05/15/19 2100            Significant initial  Findings: Abnormal Labs Reviewed  COMPREHENSIVE METABOLIC PANEL - Abnormal; Notable for the following components:      Result Value   Sodium 132 (*)    Potassium 2.6 (*)    Glucose, Bld 135 (*)    BUN 27 (*)    Creatinine, Ser 1.55 (*)    Calcium 8.0 (*)    Total Protein 6.1 (*)    Albumin 2.1 (*)    AST 87 (*)    GFR calc non Af Amer 44 (*)    GFR calc Af Amer 51 (*)    All other components within normal limits  CBC WITH DIFFERENTIAL/PLATELET - Abnormal; Notable for the following components:   RBC 4.15 (*)    Hemoglobin 9.4 (*)    HCT 31.8 (*)    MCV 76.6 (*)    MCH 22.7 (*)    MCHC 29.6 (*)    RDW 16.6 (*)    Lymphs Abs 0.6 (*)    All other components within normal limits    Otherwise labs showing:    Recent Labs  Lab 05/15/19 1627  NA 132*  K 2.6*  CO2 23  GLUCOSE 135*  BUN 27*  CREATININE 1.55*  CALCIUM 8.0*  MG 1.9    Cr   stable,    Lab Results  Component Value Date   CREATININE 1.55 (H) 05/15/2019   CREATININE 1.77 (H) 04/03/2019   CREATININE 2.14 (H) 02/04/2018    Recent Labs  Lab 05/15/19 1627  AST 87*  ALT 33  ALKPHOS 123  BILITOT 0.9  PROT 6.1*  ALBUMIN 2.1*   Lab Results  Component Value Date   CALCIUM 8.0 (L) 05/15/2019     WBC      Component Value Date/Time   WBC 6.3 05/15/2019 1627   ANC    Component Value Date/Time   NEUTROABS 5.4 05/15/2019 1627   NEUTROABS 4.1 01/07/2015 0959   ALC 0.6  Plt: Lab Results  Component Value Date   PLT 240 05/15/2019    Lactic Acid, Venous No results found for: LATICACIDVEN    COVID-19 Labs     Lab Results  Component Value Date   SARSCOV2NAA NEGATIVE 05/15/2019     HG/HCT  Down      Component Value Date/Time   HGB 9.4 (L) 05/15/2019 1627    HCT 31.8 (L) 05/15/2019 1627    Hemoccult  stool       DM  labs:  HbA1C: Recent Labs    04/03/19 0852  HGBA1C 6.2*       UA   ordered       CXR - bilateral infiltrates   CTA chest -widespread nodular and cavitary lesions throughout both lungs with air-fluid cystic lesions in the right upper lobe differential including atypical infection such as tuberculosis versus cavitating malignancy   ECG:  Personally reviewed by me showing: HR : 102 Rhythm:   NSR,  w PVC   QTC 447      ED Triage Vitals [05/15/19 1550]  Enc Vitals Group     BP 118/66     Pulse Rate (!) 108     Resp 16     Temp 99.8 F (37.7 C)     Temp Source Oral     SpO2 95 %     Weight 120 lb (54.4 kg)     Height 5\' 6"  (1.676 m)     Head Circumference      Peak Flow      Pain Score 0     Pain Loc      Pain Edu?      Excl. in GC?   IONG(29)@TMAX(24)@       Latest  Blood pressure 138/78, pulse 84, temperature 99.8 F (37.7 C), temperature source Oral, resp. rate (!) 22, height 5\' 6"  (1.676 m), weight 54.4 kg, SpO2 92 %.     Hospitalist was called for admission for cavitary pneumonia   Review of Systems:    Pertinent positives include:  fatigue, weight loss  shortness of breath at rest  productive cough,  Constitutional:  No weight loss, night sweats, Fevers, chills, HEENT:  No headaches, Difficulty swallowing,Tooth/dental problems,Sore throat,  No sneezing, itching, ear ache, nasal congestion, post nasal drip,  Cardio-vascular:  No chest pain, Orthopnea, PND, anasarca, dizziness, palpitations.no Bilateral lower extremity swelling  GI:  No heartburn, indigestion, abdominal pain, nausea, vomiting, diarrhea, change in bowel habits, loss of appetite, melena, blood in stool, hematemesis Resp:  no. No dyspnea on exertion, No excess mucus, no No non-productive cough, No coughing up of blood.No change in color of mucus.No wheezing. Skin:  no rash or lesions. No jaundice GU:  no dysuria, change in color of  urine, no urgency or frequency. No straining to urinate.  No flank pain.  Musculoskeletal:  No joint pain or no joint swelling. No decreased range of motion. No back pain.  Psych:  No change in mood or affect. No depression or anxiety. No memory loss.  Neuro: no localizing neurological complaints, no tingling, no weakness, no double vision, no gait abnormality, no slurred speech, no confusion  All systems reviewed and apart from HOPI all are negative  Past Medical History:   Past Medical History:  Diagnosis Date   Abnormal CT of the chest 2009   nodule 9 x 6 mm RUL   Benign localized hyperplasia of prostate with urinary obstruction and other lower urinary tract symptoms (LUTS)(600.21)    Cardiomegaly    Cerebrovascular accident (stroke) (HCC) 2009   lacunar infarct in the left thalamus   Cholelithiasis    Chronic airway obstruction, not elsewhere classified    Chronic kidney disease, stage III (moderate) (HCC)    Closed femur fracture (HCC) 1960   Illiterate    Obesity, unspecified    Other and unspecified hyperlipidemia    Tobacco use disorder    Type II or unspecified type  diabetes mellitus with renal manifestations, uncontrolled(250.42)    Unspecified essential hypertension        Past Surgical History:  Procedure Laterality Date   FEMUR FRACTURE SURGERY Left 1960    Social History:  Ambulatory  Independently      reports that he has been smoking cigarettes. He has a 30.00 pack-year smoking history. He has never used smokeless tobacco. He reports current alcohol use of about 1.0 standard drinks of alcohol per week. He reports that he does not use drugs.     Family History:   Family History  Problem Relation Age of Onset   Heart disease Mother     Allergies: No Known Allergies   Prior to Admission medications   Medication Sig Start Date End Date Taking? Authorizing Provider  albuterol (VENTOLIN HFA) 108 (90 Base) MCG/ACT inhaler INHALE 1 TO  2 PUFFS BY MOUTH EVERY 4 TO 6 HOURS AS NEEDED FOR BREATHING 05/08/19   Reed, Tiffany L, DO  aspirin EC 81 MG tablet Take 81 mg by mouth 2 (two) times daily.    [provider]  buPROPion (WELLBUTRIN XL) 150 MG 24 hr tablet TAKE 1 TABLET(150 MG) BY MOUTH DAILY 02/02/19   Reed, Tiffany L, DO  Fluticasone-Salmeterol (ADVAIR DISKUS) 250-50 MCG/DOSE AEPB Inhale 1 puff into the lungs 2 (two) times daily. 09/07/18   Reed, Tiffany L, DO  glimepiride (AMARYL) 1 MG tablet TAKE 1 TABLET(1 MG) BY MOUTH DAILY WITH BREAKFAST 04/12/19   Reed, Tiffany L, DO  lisinopril-hydrochlorothiazide (ZESTORETIC) 20-25 MG tablet TAKE 1 TABLET BY MOUTH DAILY 04/12/19   Reed, Tiffany L, DO  lovastatin (MEVACOR) 20 MG tablet TAKE 1 TABLET(20 MG) BY MOUTH DAILY AT 6 PM 04/12/19   Kermit Baloeed, Tiffany L, DO   Physical Exam: Blood pressure 138/78, pulse 84, temperature 99.8 F (37.7 C), temperature source Oral, resp. rate (!) 22, height 5\' 6"  (1.676 m), weight 54.4 kg, SpO2 92 %. 1. General:  in No  Acute distress    Chronically ill -appearing 2. Psychological: Alert and   Oriented 3. Head/ENT:   Dry Mucous Membranes                          Head Non traumatic, neck supple                            Poor Dentition 4. SKIN:  decreased Skin turgor,  Skin clean Dry and intact no rash 5. Heart: Regular rate and rhythm no  Murmur, no Rub or gallop 6. Lungs:  no wheezes or crackles   7. Abdomen: Soft, non-tender, Non distended bowel sounds present 8. Lower extremities: no clubbing, cyanosis, right leg edema 9. Neurologically Grossly intact, moving all 4 extremities equally   10. MSK: Normal range of motion   All other LABS:     Recent Labs  Lab 05/15/19 1627  WBC 6.3  NEUTROABS 5.4  HGB 9.4*  HCT 31.8*  MCV 76.6*  PLT 240     Recent Labs  Lab 05/15/19 1627  NA 132*  K 2.6*  CL 98  CO2 23  GLUCOSE 135*  BUN 27*  CREATININE 1.55*  CALCIUM 8.0*  MG 1.9     Recent Labs  Lab 05/15/19 1627  AST 87*  ALT 33    ALKPHOS 123  BILITOT 0.9  PROT 6.1*  ALBUMIN 2.1*       Cultures: No results found  for: SDES, SPECREQUEST, CULT, REPTSTATUS   Radiological Exams on Admission: Dg Chest 2 View  Result Date: 05/15/2019 CLINICAL DATA:  EMS reports from home, lives alone, friend called for failure to thrive for three months. Weakness and recent fall this a.m. Smoker. Productive cough. Hx of Cardiomegaly and Type II Diabetes. EXAM: CHEST - 2 VIEW COMPARISON:  Chest CT, 09/07/2011 FINDINGS: There are numerous ill-defined bilateral lung nodules, more numerous on the left, with intervening hazy ground-glass type opacities. Lungs are hyperexpanded. There is an emphysematous bleb in the right upper lobe measuring approximately 6 cm. No pleural effusion.  No pneumothorax. Cardiac silhouette is normal in size.  No mediastinal masses. Skeletal structures are intact. IMPRESSION: 1. Multiple bilateral ill-defined nodular type opacities with intervening ground-glass opacities, greater throughout the left lung than on the right. Findings may be due to multifocal infection or inflammation. Metastatic disease is also in the differential diagnosis. Recommend follow-up assessment with chest CT with contrast. Electronically Signed   By: Lajean Manes M.D.   On: 05/15/2019 19:24   Ct Chest W Contrast  Result Date: 05/15/2019 CLINICAL DATA:  Dyspnea, chronic, failure to thrive EXAM: CT CHEST WITH CONTRAST TECHNIQUE: Multidetector CT imaging of the chest was performed during intravenous contrast administration. CONTRAST:  13mL OMNIPAQUE IOHEXOL 300 MG/ML  SOLN COMPARISON:  Same day chest radiograph, CT chest 09/07/2011 FINDINGS: Cardiovascular: Normal heart size. No pericardial effusion. Atherosclerotic plaque within the normal caliber aorta. Atherosclerotic calcification of the coronary arteries. Calcifications of the mitral annulus Mediastinum/Nodes: No enlarged mediastinal or axillary lymph nodes. Thyroid gland, trachea, and esophagus  demonstrate no significant findings. Lungs/Pleura: Widespread randomly distributed, apically predominant consolidative opacities with central cavitation are present throughout both hemithoraces. A larger thick-walled cystic lesion is present in the right upper lobe measuring up to 6.3 cm in size. This larger focus corresponds well to an area ground-glass opacity on CT from 2012. No pneumothorax. No effusion. Upper Abdomen: Atrophic appearance of the left kidney. Few fluid attenuation cyst in the right kidney. Dependently layering gallstone within the otherwise normal gallbladder. Small hiatal hernia. Accessory splenule. Subcentimeter hypoattenuating foci in the spleen too small to fully characterize on CT imaging but statistically likely benign. Musculoskeletal: Multilevel degenerative changes are present in the imaged portions of the spine. IMPRESSION: Widespread nodular and cavitary lesions throughout both lungs with a larger air-filled cystic lesion in the right upper lobe. Overall appearance is nonspecific and differential could include atypical infection such as tuberculosis which requires exclusion versus other considerations including cavitating malignancy such as diffuse squamous cell or septic pulmonary emboli. Sputum cultures and tissue sampling may provide a more definitive diagnosis. Aortic Atherosclerosis (ICD10-I70.0). Electronically Signed   By: Lovena Le M.D.   On: 05/15/2019 20:39    Chart has been reviewed    Assessment/Plan   73 y.o. male with medical history significant of cardiomegaly, stroke, COPD,   CKD stage III, tobacco abuse, DM 2, HTN,  Admitted for cavitary lesions unclear etiology work-up for TB versus malignancy  Present on Admission: Cavitary lung lesions unclear etiology differential includes malignancy versus tuberculosis versus other atypical infection.  COVID negative.  Discussed with pulmonology. Order first set of AFB sputum's will need to continue daily. Ordered  QuantiFERON TB gold Airborne precautions May benefit from ID consult As embolic disease also on differential order echogram At this point hold off on antibiotics initiation is unsure of etiology and patient does not appear to be acutely septic     Essential hypertension -hold off  for tonight allow some permissive hypertension as patient appears to be dehydrated and monitor  CKD (chronic kidney disease) stage 3, GFR 30-59 ml/min (HCC) chronic avoid nephrotoxic medication   Hyperlipidemia stable continue home medications   Tobacco use disorder spoke about importance of quitting will order tobacco cessation protocol  Controlled type 2 diabetes mellitus with stage 3 chronic kidney disease, without long-term current use of insulin (HCC) -  - Order Sensitive SSI   -  check TSH and HgA1C  - Hold by mouth medications    Weight loss will need to work-up for atypical infection like TB versus malignancy ordered prealbumin nutritional consult speech evaluation to make sure is no evidence of chronic dysphasia     Hypokalemia - will replace and repeat in AM,  check magnesium level and replace as needed  Right leg swelling will obtain Dopplers patient is at high risk for malignancy will work-up for possible DVT  Anemia will obtain anemia panel and Hemoccult stools so far have been negative  Other plan as per orders.  DVT prophylaxis:  SCD    Code Status:  FULL CODE   as per patient   I had personally discussed CODE STATUS with patient    Family Communication:   Family not at  Bedside    Disposition Plan:    To home once workup is complete and patient is stable                    Would benefit from PT/OT eval prior to DC  Ordered                   Swallow eval - SLP ordered                                       Nutrition    consulted                                      Consults called: discussed with  Pulmonology will see in AM  Admission status:  ED Disposition    ED  Disposition Condition Comment   Admit  Hospital Area: [facility] [[id number]]  Level of Care: Telemetry [5]  Admit to tele based on following criteria: Other see comments  Comments: possible embolic dz  Covid Evaluation: Confirmed COVID Negative  Diagnosis: Cavitary lesion of lung [[id number]]  Admitting Physician: Therisa Doyne [[id number]]  Attending Physician: Therisa Doyne [[id number]]  Estimated length of stay: 3 - 4 days  Certification:: I certify this patient will need inpatient services for at least 2 midnights  Bed request comments: airborn  PT Class (Do Not Modify): Inpatient [101]  PT Acc Code (Do Not Modify): Private [1]        inpatient     Expect 2 midnight stay secondary to severity of patient's current illness including     Severe lab/radiological/exam abnormalities including: cavitary  pneumonia   I expect  patient to be hospitalized for 2 midnights requiring inpatient medical care.  Patient is at high risk for adverse outcome (such as loss of life or disability) if not treated.  Indication for inpatient stay as follows:    Need for operative/procedural  intervention  TB rule out  Need for IV antibiotics, IV fluids,      Level  of care    tele   24H  discontinue once patient no longer qualifies  Precautions:  airborne Press photographer precautions  PPE: Used by the provider:   P100  eye Goggles,  Gloves    Rhodie Cienfuegos 05/15/2019, 11:03 PM    [facility]     after 2 AM please page floor coverage PA If 7AM-7PM, please contact the day team taking care of the patient using [email or web address]

## 2019-05-15 NOTE — ED Provider Notes (Signed)
[facility]-EMERGENCY DEPT Provider Note   CSN: [id number] Arrival date & time: 05/15/19  1539    History   Chief Complaint Chief Complaint  Patient presents with   Failure To Thrive   Fatigue    HPI Clarence Maldonado is a 73 y.o. male.     HPI Patient presents with generalized weakness.  States his been feeling weak for around 2 months.  States is been coughing up sputum.  No fevers.  Somewhat decreased appetite.  No chest pain.  States he feels weak in both his legs when that happens.  States he started to have falls.  Ambulance called by friend for failure to thrive.  No dysuria.  States he is not been sleeping well either. Past Medical History:  Diagnosis Date   Abnormal CT of the chest 2009   nodule 9 x 6 mm RUL   Benign localized hyperplasia of prostate with urinary obstruction and other lower urinary tract symptoms (LUTS)(600.21)    Cardiomegaly    Cerebrovascular accident (stroke) (HCC) 2009   lacunar infarct in the left thalamus   Cholelithiasis    Chronic airway obstruction, not elsewhere classified    Chronic kidney disease, stage III (moderate) (HCC)    Closed femur fracture (HCC) 1960   Illiterate    Obesity, unspecified    Other and unspecified hyperlipidemia    Tobacco use disorder    Type II or unspecified type diabetes mellitus with renal manifestations, uncontrolled(250.42)    Unspecified essential hypertension     Patient Active Problem List   Diagnosis Date Noted   Hypermetropia of both eyes 03/21/2018   Weight loss 03/21/2018   Hyperlipidemia associated with type 2 diabetes mellitus (HCC) 03/21/2018   Insomnia 02/03/2017   Cognitive change 01/29/2016   Illiterate 01/03/2014   Abnormal CT scan, chest 01/03/2014   Controlled type 2 diabetes mellitus with stage 3 chronic kidney disease, without long-term current use of insulin (HCC) 01/05/2013   Stranguria 01/05/2013   Essential hypertension  01/04/2013   CKD (chronic kidney disease) stage 3, GFR 30-59 ml/min (HCC) 01/04/2013   Hyperlipidemia 01/04/2013   Tobacco use disorder 01/04/2013   BPH without obstruction/lower urinary tract symptoms 01/04/2013    Past Surgical History:  Procedure Laterality Date   FEMUR FRACTURE SURGERY Left 1960        Home Medications    Prior to Admission medications   Medication Sig Start Date End Date Taking? Authorizing Provider  albuterol (VENTOLIN HFA) 108 (90 Base) MCG/ACT inhaler INHALE 1 TO 2 PUFFS BY MOUTH EVERY 4 TO 6 HOURS AS NEEDED FOR BREATHING 05/08/19   Reed, Tiffany L, DO  aspirin EC 81 MG tablet Take 81 mg by mouth 2 (two) times daily.    [provider]  buPROPion (WELLBUTRIN XL) 150 MG 24 hr tablet TAKE 1 TABLET(150 MG) BY MOUTH DAILY 02/02/19   Reed, Tiffany L, DO  Fluticasone-Salmeterol (ADVAIR DISKUS) 250-50 MCG/DOSE AEPB Inhale 1 puff into the lungs 2 (two) times daily. 09/07/18   Reed, Tiffany L, DO  glimepiride (AMARYL) 1 MG tablet TAKE 1 TABLET(1 MG) BY MOUTH DAILY WITH BREAKFAST 04/12/19   Reed, Tiffany L, DO  lisinopril-hydrochlorothiazide (ZESTORETIC) 20-25 MG tablet TAKE 1 TABLET BY MOUTH DAILY 04/12/19   Reed, Tiffany L, DO  lovastatin (MEVACOR) 20 MG tablet TAKE 1 TABLET(20 MG) BY MOUTH DAILY AT 6 PM 04/12/19   Kermit Baloeed, Tiffany L, DO    Family History Family History  Problem Relation Age of Onset  Heart disease Mother     Social History Social History   Tobacco Use   Smoking status: Current Every Day Smoker    Packs/day: 0.50    Years: 60.00    Pack years: 30.00    Types: Cigarettes   Smokeless tobacco: Never Used   Tobacco comment: Used to be 2 packs a day.  Substance Use Topics   Alcohol use: Yes    Alcohol/week: 1.0 standard drinks    Types: 1 Cans of beer per week   Drug use: No     Allergies   Patient has no known allergies.   Review of Systems Review of Systems  Constitutional: Positive for appetite change and fatigue.    HENT: Negative for congestion.   Respiratory: Positive for cough. Negative for wheezing.   Cardiovascular: Negative for chest pain.  Gastrointestinal: Negative for abdominal pain.  Genitourinary: Negative for flank pain.  Musculoskeletal: Negative for back pain.  Skin: Positive for wound.       Abrasions to right forearm from fall.  Neurological: Positive for weakness.  Psychiatric/Behavioral: Negative for confusion.     Physical Exam Updated Vital Signs BP 125/72    Pulse 83    Temp 99.8 F (37.7 C) (Oral)    Resp (!) 25    Ht 5\' 6"  (1.676 m)    Wt 54.4 kg    SpO2 95%    BMI 19.37 kg/m   Physical Exam Vitals signs and nursing note reviewed.  HENT:     Head: Atraumatic.  Eyes:     Extraocular Movements: Extraocular movements intact.  Cardiovascular:     Rate and Rhythm: Normal rate and regular rhythm.  Pulmonary:     Comments: Mildly harsh breath sounds without focal rales or rhonchi. Abdominal:     Tenderness: There is no abdominal tenderness.  Musculoskeletal:     Right lower leg: No edema.     Left lower leg: No edema.  Skin:    General: Skin is warm.     Capillary Refill: Capillary refill takes less than 2 seconds.     Comments: 2 superficial abrasions to right forearm after fall.  No underlying bony tenderness.  Neurological:     Mental Status: He is alert.     Comments: Awake and appropriate.      ED Treatments / Results  Labs (all labs ordered are listed, but only abnormal results are displayed) Labs Reviewed  COMPREHENSIVE METABOLIC PANEL - Abnormal; Notable for the following components:      Result Value   Sodium 132 (*)    Potassium 2.6 (*)    Glucose, Bld 135 (*)    BUN 27 (*)    Creatinine, Ser 1.55 (*)    Calcium 8.0 (*)    Total Protein 6.1 (*)    Albumin 2.1 (*)    AST 87 (*)    GFR calc non Af Amer 44 (*)    GFR calc Af Amer 51 (*)    All other components within normal limits  CBC WITH DIFFERENTIAL/PLATELET - Abnormal; Notable for the  following components:   RBC 4.15 (*)    Hemoglobin 9.4 (*)    HCT 31.8 (*)    MCV 76.6 (*)    MCH 22.7 (*)    MCHC 29.6 (*)    RDW 16.6 (*)    Lymphs Abs 0.6 (*)    All other components within normal limits  SARS CORONAVIRUS 2 (HOSPITAL ORDER, [facility])  MAGNESIUM  URINALYSIS, ROUTINE W REFLEX MICROSCOPIC  POC OCCULT BLOOD, ED    EKG EKG Interpretation  Date/Time:  Monday May 15 2019 16:20:21 EDT Ventricular Rate:  102 PR Interval:    QRS Duration: 103 QT Interval:  343 QTC Calculation: 447 R Axis:   62 Text Interpretation:  Sinus tachycardia Multiple premature complexes, vent & supraven Low voltage, extremity and precordial leads Confirmed by Benjiman CorePickering, Karell Tukes [phone]([id number]) on 05/15/2019 4:22:30 PM   Radiology Dg Chest 2 View  Result Date: 05/15/2019 CLINICAL DATA:  EMS reports from home, lives alone, friend called for failure to thrive for three months. Weakness and recent fall this a.m. Smoker. Productive cough. Hx of Cardiomegaly and Type II Diabetes. EXAM: CHEST - 2 VIEW COMPARISON:  Chest CT, 09/07/2011 FINDINGS: There are numerous ill-defined bilateral lung nodules, more numerous on the left, with intervening hazy ground-glass type opacities. Lungs are hyperexpanded. There is an emphysematous bleb in the right upper lobe measuring approximately 6 cm. No pleural effusion.  No pneumothorax. Cardiac silhouette is normal in size.  No mediastinal masses. Skeletal structures are intact. IMPRESSION: 1. Multiple bilateral ill-defined nodular type opacities with intervening ground-glass opacities, greater throughout the left lung than on the right. Findings may be due to multifocal infection or inflammation. Metastatic disease is also in the differential diagnosis. Recommend follow-up assessment with chest CT with contrast. Electronically Signed   By: Amie Portlandavid  Ormond M.D.   On: 05/15/2019 19:24   Ct Chest W Contrast  Result Date: 05/15/2019 CLINICAL DATA:   Dyspnea, chronic, failure to thrive EXAM: CT CHEST WITH CONTRAST TECHNIQUE: Multidetector CT imaging of the chest was performed during intravenous contrast administration. CONTRAST:  60mL OMNIPAQUE IOHEXOL 300 MG/ML  SOLN COMPARISON:  Same day chest radiograph, CT chest 09/07/2011 FINDINGS: Cardiovascular: Normal heart size. No pericardial effusion. Atherosclerotic plaque within the normal caliber aorta. Atherosclerotic calcification of the coronary arteries. Calcifications of the mitral annulus Mediastinum/Nodes: No enlarged mediastinal or axillary lymph nodes. Thyroid gland, trachea, and esophagus demonstrate no significant findings. Lungs/Pleura: Widespread randomly distributed, apically predominant consolidative opacities with central cavitation are present throughout both hemithoraces. A larger thick-walled cystic lesion is present in the right upper lobe measuring up to 6.3 cm in size. This larger focus corresponds well to an area ground-glass opacity on CT from 2012. No pneumothorax. No effusion. Upper Abdomen: Atrophic appearance of the left kidney. Few fluid attenuation cyst in the right kidney. Dependently layering gallstone within the otherwise normal gallbladder. Small hiatal hernia. Accessory splenule. Subcentimeter hypoattenuating foci in the spleen too small to fully characterize on CT imaging but statistically likely benign. Musculoskeletal: Multilevel degenerative changes are present in the imaged portions of the spine. IMPRESSION: Widespread nodular and cavitary lesions throughout both lungs with a larger air-filled cystic lesion in the right upper lobe. Overall appearance is nonspecific and differential could include atypical infection such as tuberculosis which requires exclusion versus other considerations including cavitating malignancy such as diffuse squamous cell or septic pulmonary emboli. Sputum cultures and tissue sampling may provide a more definitive diagnosis. Aortic Atherosclerosis  (ICD10-I70.0). Electronically Signed   By: Kreg ShropshirePrice  DeHay M.D.   On: 05/15/2019 20:39    Procedures Procedures (including critical care time)  Medications Ordered in ED Medications  sodium chloride (PF) 0.9 % injection (has no administration in time range)  sodium chloride 0.9 % bolus 1,000 mL (0 mLs Intravenous Stopped 05/15/19 1853)  potassium chloride 10 mEq in 100 mL IVPB (0 mEq Intravenous Stopped 05/15/19 1957)  iohexol (OMNIPAQUE) 300  MG/ML solution 75 mL (60 mLs Intravenous Contrast Given 05/15/19 2004)     Initial Impression / Assessment and Plan / ED Course  I have reviewed the triage vital signs and the nursing notes.  Pertinent labs & imaging results that were available during my care of the patient were reviewed by me and considered in my medical decision making (see chart for details).       Patient presents with worsening fatigue and generalized weakness.  Has had cough with sputum production.  Has been feeling bad for a couple months.  COVID negative.  X-ray showed multifocal abnormality.  CT scan done and showed nodular and cavitary lesions.  Does not have a white count does not have fever.  Malignancy considered.  Will admit to hospitalist.  Also potentially tuberculosis.  Atypical infection also considered.  Final Clinical Impressions(s) / ED Diagnoses   Final diagnoses:  Fatigue, unspecified type  Hypokalemia  Lung infiltrate  Anemia, unspecified type    ED Discharge Orders    None       Benjiman Core, MD 05/15/19 2101

## 2019-05-15 NOTE — ED Triage Notes (Signed)
EMS reports from home, lives alone, friend called for failure to thrive for three months. Friend states Pt has been getting progressively weaker and begun falling..  BP 134/72 HR 90 RR 14 Sp02 95 RA CBG 165  18 ga L forearm 500 ml NS

## 2019-05-15 NOTE — ED Notes (Signed)
Pt back from radiology 

## 2019-05-15 NOTE — ED Notes (Signed)
Pt states he is unable to urinate at this time.

## 2019-05-15 NOTE — ED Notes (Signed)
Provider informed of critical lab K=2.6

## 2019-05-15 NOTE — ED Notes (Signed)
Pt has urinal at bedside 

## 2019-05-15 NOTE — ED Notes (Signed)
Admitting MD at bedside.

## 2019-05-15 NOTE — ED Notes (Addendum)
Pt aware that urine sample is needed.  

## 2019-05-15 NOTE — ED Notes (Signed)
Patient transported to CT 

## 2019-05-15 NOTE — ED Notes (Signed)
Jerlyn Ly, pt friend that helps care for him, contact number [phone]

## 2019-05-16 ENCOUNTER — Inpatient Hospital Stay (HOSPITAL_COMMUNITY): Payer: Medicare Other

## 2019-05-16 ENCOUNTER — Other Ambulatory Visit: Payer: Self-pay

## 2019-05-16 ENCOUNTER — Encounter (HOSPITAL_COMMUNITY): Payer: Self-pay

## 2019-05-16 DIAGNOSIS — J984 Other disorders of lung: Secondary | ICD-10-CM

## 2019-05-16 DIAGNOSIS — R609 Edema, unspecified: Secondary | ICD-10-CM

## 2019-05-16 DIAGNOSIS — I371 Nonrheumatic pulmonary valve insufficiency: Secondary | ICD-10-CM

## 2019-05-16 DIAGNOSIS — J690 Pneumonitis due to inhalation of food and vomit: Secondary | ICD-10-CM

## 2019-05-16 DIAGNOSIS — I361 Nonrheumatic tricuspid (valve) insufficiency: Secondary | ICD-10-CM

## 2019-05-16 DIAGNOSIS — J189 Pneumonia, unspecified organism: Secondary | ICD-10-CM

## 2019-05-16 LAB — GLUCOSE, CAPILLARY
Glucose-Capillary: 80 mg/dL (ref 70–99)
Glucose-Capillary: 73 mg/dL (ref 70–99)
Glucose-Capillary: 71 mg/dL (ref 70–99)
Glucose-Capillary: 75 mg/dL (ref 70–99)
Glucose-Capillary: 86 mg/dL (ref 70–99)

## 2019-05-16 LAB — EXPECTORATED SPUTUM ASSESSMENT W GRAM STAIN, RFLX TO RESP C

## 2019-05-16 LAB — PHOSPHORUS
Phosphorus: 2.4 mg/dL — ABNORMAL LOW (ref 2.5–4.6)
Phosphorus: 2.3 mg/dL — ABNORMAL LOW (ref 2.5–4.6)

## 2019-05-16 LAB — CBC
HCT: 30.9 % — ABNORMAL LOW (ref 39.0–52.0)
Hemoglobin: 9.2 g/dL — ABNORMAL LOW (ref 13.0–17.0)
MCH: 23 pg — ABNORMAL LOW (ref 26.0–34.0)
MCHC: 29.8 g/dL — ABNORMAL LOW (ref 30.0–36.0)
MCV: 77.3 fL — ABNORMAL LOW (ref 80.0–100.0)
Platelets: 215 10*3/uL (ref 150–400)
RBC: 4 MIL/uL — ABNORMAL LOW (ref 4.22–5.81)
RDW: 16.6 % — ABNORMAL HIGH (ref 11.5–15.5)
WBC: 7.8 10*3/uL (ref 4.0–10.5)
nRBC: 0 % (ref 0.0–0.2)

## 2019-05-16 LAB — URINALYSIS, ROUTINE W REFLEX MICROSCOPIC
Bilirubin Urine: NEGATIVE
Glucose, UA: NEGATIVE mg/dL
Ketones, ur: NEGATIVE mg/dL
Leukocytes,Ua: NEGATIVE
Nitrite: NEGATIVE
Protein, ur: NEGATIVE mg/dL
Specific Gravity, Urine: 1.032 — ABNORMAL HIGH (ref 1.005–1.030)
pH: 5 (ref 5.0–8.0)

## 2019-05-16 LAB — MAGNESIUM: Magnesium: 2 mg/dL (ref 1.7–2.4)

## 2019-05-16 LAB — COMPREHENSIVE METABOLIC PANEL
ALT: 26 U/L (ref 0–44)
AST: 44 U/L — ABNORMAL HIGH (ref 15–41)
Albumin: 1.9 g/dL — ABNORMAL LOW (ref 3.5–5.0)
Alkaline Phosphatase: 102 U/L (ref 38–126)
Anion gap: 8 (ref 5–15)
BUN: 23 mg/dL (ref 8–23)
CO2: 24 mmol/L (ref 22–32)
Calcium: 7.7 mg/dL — ABNORMAL LOW (ref 8.9–10.3)
Chloride: 100 mmol/L (ref 98–111)
Creatinine, Ser: 1.34 mg/dL — ABNORMAL HIGH (ref 0.61–1.24)
GFR calc Af Amer: >60 mL/min (ref >60)
GFR calc non Af Amer: 53 mL/min — ABNORMAL LOW (ref >60)
Glucose, Bld: 104 mg/dL — ABNORMAL HIGH (ref 70–99)
Potassium: 3.4 mmol/L — ABNORMAL LOW (ref 3.5–5.1)
Sodium: 132 mmol/L — ABNORMAL LOW (ref 135–145)
Total Bilirubin: 0.8 mg/dL (ref 0.3–1.2)
Total Protein: 5.6 g/dL — ABNORMAL LOW (ref 6.5–8.1)

## 2019-05-16 LAB — VITAMIN B12: Vitamin B-12: 483 pg/mL (ref 180–914)

## 2019-05-16 LAB — HEMOGLOBIN A1C
Hgb A1c MFr Bld: 6.3 % — ABNORMAL HIGH (ref 4.8–5.6)
Mean Plasma Glucose: 134.11 mg/dL

## 2019-05-16 LAB — RETICULOCYTES
Immature Retic Fract: 17.3 % — ABNORMAL HIGH (ref 2.3–15.9)
RBC.: 4.34 MIL/uL (ref 4.22–5.81)
Retic Count, Absolute: 45.6 10*3/uL (ref 19.0–186.0)
Retic Ct Pct: 1.1 % (ref 0.4–3.1)

## 2019-05-16 LAB — ECHOCARDIOGRAM COMPLETE
Height: 66 in
Weight: 1904.77 oz

## 2019-05-16 LAB — TSH: TSH: 0.253 u[IU]/mL — ABNORMAL LOW (ref 0.350–4.500)

## 2019-05-16 LAB — CK: Total CK: 45 U/L — ABNORMAL LOW (ref 49–397)

## 2019-05-16 LAB — IRON AND TIBC
Iron: 12 ug/dL — ABNORMAL LOW (ref 45–182)
Saturation Ratios: 10 % — ABNORMAL LOW (ref 17.9–39.5)
TIBC: 126 ug/dL — ABNORMAL LOW (ref 250–450)
UIBC: 114 ug/dL

## 2019-05-16 LAB — LACTIC ACID, PLASMA: Lactic Acid, Venous: 1.6 mmol/L (ref 0.5–1.9)

## 2019-05-16 LAB — FOLATE: Folate: 5.9 ng/mL — ABNORMAL LOW (ref >5.9)

## 2019-05-16 LAB — PROCALCITONIN: Procalcitonin: 0.69 ng/mL

## 2019-05-16 LAB — MRSA PCR SCREENING: MRSA by PCR: NEGATIVE

## 2019-05-16 LAB — CBG MONITORING, ED
Glucose-Capillary: 90 mg/dL (ref 70–99)
Glucose-Capillary: 80 mg/dL (ref 70–99)

## 2019-05-16 LAB — FERRITIN: Ferritin: 1465 ng/mL — ABNORMAL HIGH (ref 24–336)

## 2019-05-16 LAB — PREALBUMIN: Prealbumin: <5 mg/dL — ABNORMAL LOW (ref 18–38)

## 2019-05-16 MED ORDER — RESOURCE THICKENUP CLEAR PO POWD
ORAL | Status: DC | PRN
  Filled 2019-05-16 (×3): qty 125

## 2019-05-16 MED ORDER — VITAMIN B-1 100 MG PO TABS
100.0000 mg | ORAL_TABLET | Freq: Every day | ORAL | Status: DC
  Administered 2019-05-17 – 2019-07-25 (×70): 100 mg via ORAL
  Filled 2019-05-16 (×70): qty 1

## 2019-05-16 MED ORDER — ENSURE ENLIVE PO LIQD
237.0000 mL | Freq: Two times a day (BID) | ORAL | Status: DC
  Administered 2019-05-18 – 2019-05-22 (×4): 237 mL via ORAL

## 2019-05-16 MED ORDER — PRAVASTATIN SODIUM 20 MG PO TABS
20.0000 mg | ORAL_TABLET | Freq: Every day | ORAL | Status: DC
  Administered 2019-05-16 – 2019-07-25 (×71): 20 mg via ORAL
  Filled 2019-05-16 (×73): qty 1

## 2019-05-16 MED ORDER — BUPROPION HCL ER (XL) 150 MG PO TB24
150.0000 mg | ORAL_TABLET | Freq: Every day | ORAL | Status: DC
  Administered 2019-05-16 – 2019-07-25 (×71): 150 mg via ORAL
  Filled 2019-05-16 (×71): qty 1

## 2019-05-16 MED ORDER — THIAMINE HCL 100 MG/ML IJ SOLN
100.0000 mg | Freq: Every day | INTRAMUSCULAR | Status: DC
  Administered 2019-05-16: 100 mg via INTRAVENOUS
  Filled 2019-05-16: qty 2

## 2019-05-16 MED ORDER — SODIUM CHLORIDE 0.9 % IV SOLN
INTRAVENOUS | Status: AC
  Administered 2019-05-16: 01:00:00 via INTRAVENOUS

## 2019-05-16 MED ORDER — SODIUM CHLORIDE 0.9 % IV SOLN
1.5000 g | Freq: Four times a day (QID) | INTRAVENOUS | Status: AC
  Administered 2019-05-16 – 2019-05-23 (×29): 1.5 g via INTRAVENOUS
  Filled 2019-05-16 (×3): qty 1.5
  Filled 2019-05-16 (×2): qty 4
  Filled 2019-05-16 (×2): qty 1.5
  Filled 2019-05-16 (×4): qty 4
  Filled 2019-05-16 (×2): qty 1.5
  Filled 2019-05-16: qty 4
  Filled 2019-05-16: qty 1.5
  Filled 2019-05-16: qty 4
  Filled 2019-05-16 (×5): qty 1.5
  Filled 2019-05-16: qty 4
  Filled 2019-05-16 (×7): qty 1.5
  Filled 2019-05-16 (×2): qty 4
  Filled 2019-05-16 (×2): qty 1.5

## 2019-05-16 MED ORDER — STARCH (THICKENING) PO POWD
ORAL | Status: DC | PRN
  Filled 2019-05-16: qty 227

## 2019-05-16 MED ORDER — INSULIN ASPART 100 UNIT/ML ~~LOC~~ SOLN
0.0000 [IU] | SUBCUTANEOUS | Status: DC
  Administered 2019-05-17: 2 [IU] via SUBCUTANEOUS
  Administered 2019-05-17: 1 [IU] via SUBCUTANEOUS
  Administered 2019-05-18: 2 [IU] via SUBCUTANEOUS
  Filled 2019-05-16: qty 0.09

## 2019-05-16 MED ORDER — ORAL CARE MOUTH RINSE
15.0000 mL | Freq: Two times a day (BID) | OROMUCOSAL | Status: DC
  Administered 2019-05-16 – 2019-07-25 (×130): 15 mL via OROMUCOSAL

## 2019-05-16 MED ORDER — VANCOMYCIN HCL 10 G IV SOLR
1250.0000 mg | Freq: Once | INTRAVENOUS | Status: AC
  Administered 2019-05-16: 1250 mg via INTRAVENOUS
  Filled 2019-05-16: qty 1250

## 2019-05-16 MED ORDER — MOMETASONE FURO-FORMOTEROL FUM 200-5 MCG/ACT IN AERO
2.0000 | INHALATION_SPRAY | Freq: Two times a day (BID) | RESPIRATORY_TRACT | Status: DC
  Administered 2019-05-16 – 2019-05-18 (×4): 2 via RESPIRATORY_TRACT
  Filled 2019-05-16: qty 8.8

## 2019-05-16 MED ORDER — SODIUM CHLORIDE 0.9 % IV SOLN
INTRAVENOUS | Status: AC
  Administered 2019-05-16: 12:00:00 via INTRAVENOUS

## 2019-05-16 MED ORDER — ADULT MULTIVITAMIN W/MINERALS CH
1.0000 | ORAL_TABLET | Freq: Every day | ORAL | Status: DC
  Administered 2019-05-16 – 2019-07-25 (×71): 1 via ORAL
  Filled 2019-05-16 (×71): qty 1

## 2019-05-16 MED ORDER — ONDANSETRON HCL 4 MG PO TABS: 4.0000 mg | ORAL_TABLET | Freq: Four times a day (QID) | ORAL | Status: DC | PRN

## 2019-05-16 MED ORDER — POTASSIUM CHLORIDE CRYS ER 20 MEQ PO TBCR
40.0000 meq | EXTENDED_RELEASE_TABLET | Freq: Once | ORAL | Status: AC
  Administered 2019-05-16: 40 meq via ORAL
  Filled 2019-05-16: qty 2

## 2019-05-16 MED ORDER — ACETAMINOPHEN 325 MG PO TABS
650.0000 mg | ORAL_TABLET | Freq: Four times a day (QID) | ORAL | Status: DC | PRN
  Administered 2019-05-16 – 2019-07-20 (×4): 650 mg via ORAL
  Filled 2019-05-16 (×8): qty 2

## 2019-05-16 MED ORDER — VANCOMYCIN HCL IN DEXTROSE 1-5 GM/200ML-% IV SOLN: 1000.0000 mg | INTRAVENOUS | Status: DC

## 2019-05-16 MED ORDER — ACETAMINOPHEN 650 MG RE SUPP: 650.0000 mg | Freq: Four times a day (QID) | RECTAL | Status: DC | PRN

## 2019-05-16 MED ORDER — PHENOL 1.4 % MT LIQD
1.0000 | OROMUCOSAL | Status: DC | PRN
  Administered 2019-05-22: 1 via OROMUCOSAL
  Filled 2019-05-16 (×2): qty 177

## 2019-05-16 MED ORDER — HYDROCODONE-ACETAMINOPHEN 5-325 MG PO TABS
1.0000 | ORAL_TABLET | ORAL | Status: DC | PRN
  Administered 2019-06-16 – 2019-06-18 (×2): 1 via ORAL
  Administered 2019-06-19: 2 via ORAL
  Administered 2019-06-22: 1 via ORAL
  Filled 2019-05-16 (×2): qty 2
  Filled 2019-05-16 (×5): qty 1

## 2019-05-16 MED ORDER — ONDANSETRON HCL 4 MG/2ML IJ SOLN
4.0000 mg | Freq: Four times a day (QID) | INTRAMUSCULAR | Status: DC | PRN
  Administered 2019-05-21: 4 mg via INTRAVENOUS
  Filled 2019-05-16 (×2): qty 2

## 2019-05-16 NOTE — Progress Notes (Signed)
Initial Nutrition Assessment  INTERVENTION:   -Provide Ensure Enlive po BID, each supplement provides 350 kcal and 20 grams of protein -Provide Magic cup BID with meals, each supplement provides 290 kcal and 9 grams of protein -Provide Multivitamin with minerals daily  NUTRITION DIAGNOSIS:   Inadequate oral intake related to poor appetite as evidenced by per patient/family report.  GOAL:   Patient will meet greater than or equal to 90% of their needs  MONITOR:   PO intake, Supplement acceptance, Labs, Weight trends, I & O's  REASON FOR ASSESSMENT:   Consult Assessment of nutrition requirement/status  ASSESSMENT:   73 y.o. male with medical history significant of cardiomegaly, stroke, COPD,   CKD stage III, tobacco abuse, DM 2, HTN,  Admitted for cavitary lesions unclear etiology work-up for TB versus malignancy  **RD working remotely**  Patient with poor appetite for a few months now. Pt at most has been eating 1 meal day. Now with SOB which has been affecting eating as well. Per chart review, pt with history of dysphagia with solid foods. SLP to evaluate. No PO has been documented at this time. Will order Ensure supplements and Magic Cups with meals for additional kcal and protein.  Per weight records, pt weighed 162 lbs in June 2020. Now weighs 119 lbs. This is a 26% wt loss x 2 months, significant for time frame. Per nursing documentation pt with +2 moderate edema in LEs.  Medications: IV Thiamine daily, IV KCl Labs reviewed: CBGs: 71-73 Low Na, K, Phos Mg WNL   NUTRITION - FOCUSED PHYSICAL EXAM:  Unable to perform -working remotely.  Diet Order:   Diet Order            Diet Carb Modified Fluid consistency: Thin; Room service appropriate? Yes  Diet effective now              EDUCATION NEEDS:   No education needs have been identified at this time  Skin:  Skin Assessment: Reviewed RN Assessment  Last BM:  PTA  Height:   Ht Readings from Last 1  Encounters:  05/16/19 5\' 6"  (1.676 m)    Weight:   Wt Readings from Last 1 Encounters:  05/16/19 54 kg    Ideal Body Weight:  64.5 kg  BMI:  Body mass index is 19.21 kg/m.  Estimated Nutritional Needs:   Kcal:  1600-1800  Protein:  70-80g  Fluid:  1.8L/day  Clayton Bibles, MS, RD, LDN [facility] [facility] Pager: [phone] After Hours Pager: [phone]

## 2019-05-16 NOTE — Consult Note (Signed)
NAME:  Clarence Maldonado, MRN:  [id number], DOB:  19-Aug-1946, LOS: 1 ADMISSION DATE:  05/15/2019, CONSULTATION DATE:  8/11 REFERRING MD:  Roel Cluck, CHIEF COMPLAINT:  Abnormal CT chest    Brief History   This is a 73 year old male who presented 8/10 w/ 2-3 mo hx of progressive weakness and cough. CT imaging showed diffuse cavitary nodular lung lesions. PCCM asked to eval.   History of present illness   This is a 73 year old male w/ hx as mentioned below. Lives at home alone. Presented to the [facility] ED on 8/10 w/ cc: weakness, cough and shortness of breath w/ sig decreased activity tolerance and falling at home (presented after friend called EMS). This had been slowly progressing over a time frame of 2-3 months.  -no fever no chills, can't say about wt loss (but compared w/ picture on chart appears sig), no dental pain or problems, no  hemoptysis, not able to tell when shortness of breath first noted, and keeps repeating "I don't know" when questioning requires more than 2-3 words.  No sick exposures, no  recent illness, no travel, no incarceration, no Tb exposure, worked in [employer] prior to retiring. He's not able to tell me about possible environmental exposures or his home in general .  CT chest obtained showed diffuse widespread cavitary lung nodules w/ large cystic lesion in the RUL. PCCM asked to evaluate.   Past Medical History  Stroke (2009), COPD, HTN, DM, obesity, CKD stage III, tobacco abuse   Significant Hospital Events   8/10 admitted  Consults:  PCCM consulted 8/11  Procedures:    Significant Diagnostic Tests:  CT chest 8/10: Widespread nodular and cavitary lesions throughout both lungs with a larger air-filled cystic lesion in the right upper lobe. Overall appearance is nonspecific and differential could include atypical infection such as tuberculosis which requires exclusion versus other considerations including cavitating malignancy such as diffuse squamous cell or septic  pulmonary emboli.   Micro Data:  AFB smear 8/11>>> afb 811>>> COVID-19: neg QF gold 8/11>>> BCX2 8/11>>> Antimicrobials:  unasyn 8/11>>>  Interim history/subjective:  Can't tell if feels better   Objective   Blood pressure (Abnormal) 168/91, pulse 94, temperature 98.6 F (37 C), temperature source Oral, resp. rate (Abnormal) 24, height _0  (1.676 m), weight 54 kg, SpO2 98 %.        Intake/Output Summary (Last 24 hours) at 05/16/2019 4975 Last data filed at 05/15/2019 1853 Gross per 24 hour  Intake 1000 ml  Output no documentation  Net 1000 ml   Filed Weights   05/15/19 1550 05/16/19 0629  Weight: 54.4 kg 54 kg    Examination: General: chronically ill appearing 73 year old white male lying in bed HENT: NCAT edentulous w/out evidence of oral infections. MMM Lungs: some scattered rhonchi. Course rhonchus cough. No accessory use  Cardiovascular: RRR w/out MRG Abdomen: soft not tender + bowel sounds  Extremities: warm and dry no edema  Neuro: awake, lethargic. Moves all extremities but withdrawn psychologically. Keeps replying "I don't know" any time questioning exceeds 2-3 word complexity for answers  GU: voids   Resolved Hospital Problem list     Assessment & Plan:   Diffuse cavitary lung nodules  R/o cavitary PNA Failure to thrive Acute metabolic encephalopathy DM COPD CKD stage III Anemia  Fluid and electrolyte imbalance    Diffuse cavitary lung nodules w/ failure to thrive.  Ddx: include infectious such as chronic aspiration (does endorse occasional ETOH intake, but  not able to quantify), septic emboli (currently blood cultures pending), atypical infection such as MAC or Tb or non-infectious concern would be malignancy (he is a smoker) -he has a very productive cough and has already provided one AFB Plan Send serial AFBs (repeat one now) Send respiratory culture F/u QF-Gold  Send PCT Add empiric unasyn F/u blood cultures. If positive will need echo  Consider swallow eval Will defer to Dr Elsworth Soho re: BAL which would need to be done if expectorated afb samples inadequate    Best practice:  Diet: reg Pain/Anxiety/Delirium protocol (if indicated): na VAP protocol (if indicated): na DVT prophylaxis: scd GI prophylaxis: na  Glucose control: ssi Mobility: increase as able Code Status: full code  Family Communication: pending  Disposition:  Currently evaluating cavitary changes  Labs   CBC: Recent Labs  Lab 05/15/19 1627 05/16/19 0426  WBC 6.3 7.8  NEUTROABS 5.4  --   HGB 9.4* 9.2*  HCT 31.8* 30.9*  MCV 76.6* 77.3*  PLT 240 024    Basic Metabolic Panel: Recent Labs  Lab 05/15/19 1627 05/16/19 0107 05/16/19 0426  NA 132*  --  132*  K 2.6*  --  3.4*  CL 98  --  100  CO2 23  --  24  GLUCOSE 135*  --  104*  BUN 27*  --  23  CREATININE 1.55*  --  1.34*  CALCIUM 8.0*  --  7.7*  MG 1.9  --  2.0  PHOS  --  2.4* 2.3*   GFR: Estimated Creatinine Clearance: 38.1 mL/min (A) (by C-G formula based on SCr of 1.34 mg/dL (H)). Recent Labs  Lab 05/15/19 1627 05/16/19 0426  WBC 6.3 7.8    Liver Function Tests: Recent Labs  Lab 05/15/19 1627 05/16/19 0426  AST 87* 44*  ALT 33 26  ALKPHOS 123 102  BILITOT 0.9 0.8  PROT 6.1* 5.6*  ALBUMIN 2.1* 1.9*   No results for input(s): LIPASE, AMYLASE in the last 168 hours. No results for input(s): AMMONIA in the last 168 hours.  ABG No results found for: PHART, PCO2ART, PO2ART, HCO3, TCO2, ACIDBASEDEF, O2SAT   Coagulation Profile: No results for input(s): INR, PROTIME in the last 168 hours.  Cardiac Enzymes: Recent Labs  Lab 05/16/19 0107  CKTOTAL 45*    HbA1C: Hgb A1c MFr Bld  Date/Time Value Ref Range Status  04/03/2019 08:52 AM 6.2 (H) <5.7 % of total Hgb Final    Comment:    For someone without known diabetes, a hemoglobin  A1c value between 5.7% and 6.4% is consistent with prediabetes and should be confirmed with a  follow-up test. . For someone with  known diabetes, a value <7% indicates that their diabetes is well controlled. A1c targets should be individualized based on duration of diabetes, age, comorbid conditions, and other considerations. . This assay result is consistent with an increased risk of diabetes. . Currently, no consensus exists regarding use of hemoglobin A1c for diagnosis of diabetes for children. Marland Kitchen   02/04/2018 09:54 AM 6.1 (H) <5.7 % of total Hgb Final    Comment:    For someone without known diabetes, a hemoglobin  A1c value between 5.7% and 6.4% is consistent with prediabetes and should be confirmed with a  follow-up test. . For someone with known diabetes, a value <7% indicates that their diabetes is well controlled. A1c targets should be individualized based on duration of diabetes, age, comorbid conditions, and other considerations. . This assay result is consistent with an  increased risk of diabetes. . Currently, no consensus exists regarding use of hemoglobin A1c for diagnosis of diabetes for children. .     CBG: Recent Labs  Lab 05/16/19 0116 05/16/19 0428 05/16/19 0804  GLUCAP 90 80 73    Review of Systems:   Review of Systems  Constitutional: Positive for malaise/fatigue and weight loss. Negative for chills, diaphoresis and fever.  HENT: Negative.   Eyes: Negative.   Respiratory: Positive for cough, sputum production and shortness of breath. Negative for hemoptysis and wheezing.   Cardiovascular: Negative.   Gastrointestinal: Negative.   Genitourinary: Negative.   Musculoskeletal: Positive for falls.  Skin: Negative.   Neurological: Positive for loss of consciousness and weakness.  Endo/Heme/Allergies: Negative.   Psychiatric/Behavioral: Positive for memory loss and substance abuse.       Still smokes occ ETOH     Past Medical History  He,  has a past medical history of Abnormal CT of the chest (2009), Benign localized hyperplasia of prostate with urinary obstruction  and other lower urinary tract symptoms (LUTS)(600.21), Cardiomegaly, Cerebrovascular accident (stroke) ([place]) (2009), Cholelithiasis, Chronic airway obstruction, not elsewhere classified, Chronic kidney disease, stage III (moderate) ([place]), Closed femur fracture ([place]) (1960), Illiterate, Obesity, unspecified, Other and unspecified hyperlipidemia, Tobacco use disorder, Type II or unspecified type diabetes mellitus with renal manifestations, uncontrolled(250.42), and Unspecified essential hypertension.   Surgical History    Past Surgical History:  Procedure Laterality Date  . FEMUR FRACTURE SURGERY Left 1960     Social History   reports that he has been smoking cigarettes. He has a 30.00 pack-year smoking history. He has never used smokeless tobacco. He reports current alcohol use of about 1.0 standard drinks of alcohol per week. He reports that he does not use drugs.   Family History   His family history includes Heart disease in his mother.   Allergies No Known Allergies   Home Medications  Prior to Admission medications   Medication Sig Start Date End Date Taking? Authorizing Provider  albuterol (VENTOLIN HFA) 108 (90 Base) MCG/ACT inhaler INHALE 1 TO 2 PUFFS BY MOUTH EVERY 4 TO 6 HOURS AS NEEDED FOR BREATHING Patient taking differently: Inhale 1-2 puffs into the lungs every 4 (four) hours as needed for wheezing or shortness of breath.  05/08/19  Yes Reed, Tiffany L, DO  aspirin EC 81 MG tablet Take 81 mg by mouth 2 (two) times daily.   Yes [provider]  buPROPion (WELLBUTRIN XL) 150 MG 24 hr tablet TAKE 1 TABLET(150 MG) BY MOUTH DAILY Patient taking differently: Take 150 mg by mouth daily.  02/02/19  Yes Reed, Tiffany L, DO  Fluticasone-Salmeterol (ADVAIR DISKUS) 250-50 MCG/DOSE AEPB Inhale 1 puff into the lungs 2 (two) times daily. 09/07/18  Yes Reed, Tiffany L, DO  glimepiride (AMARYL) 1 MG tablet TAKE 1 TABLET(1 MG) BY MOUTH DAILY WITH BREAKFAST Patient taking differently: Take  1 mg by mouth daily with breakfast.  04/12/19  Yes Reed, Tiffany L, DO  lisinopril-hydrochlorothiazide (ZESTORETIC) 20-25 MG tablet TAKE 1 TABLET BY MOUTH DAILY 04/12/19  Yes Reed, Tiffany L, DO  lovastatin (MEVACOR) 20 MG tablet TAKE 1 TABLET(20 MG) BY MOUTH DAILY AT 6 PM Patient taking differently: Take 20 mg by mouth daily at 6 PM.  04/12/19  Yes Gayland Curry, DO     Critical care time: na       Erick Colace ACNP-BC [place] Pager # [phone] OR # [phone] if no answer

## 2019-05-16 NOTE — Progress Notes (Signed)
Pharmacy Antibiotic Note  Clarence Maldonado is a 73 y.o. male admitted on 05/15/2019 with pneumonia.  Pharmacy has been consulted for vancomycin dosing. CT chest obtained showed diffuse widespread cavitary lung nodules w/ large cystic lesion in the RUL AF, WBC 7.8. SCr 1.55>1.34, hx CKD 3, SCr 6/29 1.77, was 2 - 2.28 in April and May of 2020.   Plan: Unasyn 1.5 gm IV q6h per MD Vancomycin 1250 mg IV loading dose Vancomycin 1000 mg IV Q 36 hrs. Goal AUC 400-550. Expected AUC: 525.7  37.2/11.9 SCr used: 1.5, TBW Daily SCr  Height: 5\' 6"  (167.6 cm) Weight: 119 lb 0.8 oz (54 kg) IBW/kg (Calculated) : 63.8  Temp (24hrs), Avg:99.2 F (37.3 C), Min:98.6 F (37 C), Max:99.8 F (37.7 C)  Recent Labs  Lab 05/15/19 1627 05/16/19 0426  WBC 6.3 7.8  CREATININE 1.55* 1.34*    Estimated Creatinine Clearance: 38.1 mL/min (A) (by C-G formula based on SCr of 1.34 mg/dL (H)).    No Known Allergies  Antimicrobials this admission: 8/11 vanc>> 8/11 Unasyn>> Dose adjustments this admission:  Microbiology results: 8/11 AFB x 2: sent 8/11 BCx2: sent 8/10 Covid: neg 8/11 quantiferon- TB gold: ip 8/11 MRSA PCR: ordered   Thank you for allowing pharmacy to be a part of this patient's care.  Eudelia Bunch, Pharm.D [phone] 05/16/2019 12:19 PM

## 2019-05-16 NOTE — Progress Notes (Signed)
Lower extremity venous has been completed.   Preliminary results in CV Proc.   Abram Sander 05/16/2019 11:27 AM

## 2019-05-16 NOTE — Progress Notes (Signed)
   05/16/19 1225  MEWS Score  Resp (!) 30  Pulse Rate (!) 103  BP (!) 143/80  Temp 99.4 F (37.4 C)  SpO2 92 %  O2 Device Room Air  MEWS Score  MEWS RR 2  MEWS Pulse 1  MEWS Systolic 0  MEWS LOC 0  MEWS Temp 0  MEWS Score 3  MEWS Score Color Yellow  MEWS Assessment  Is this an acute change? No   MD notified. Will continue to monitor.  Stacey Drain

## 2019-05-16 NOTE — Plan of Care (Signed)
  Problem: Clinical Measurements: Goal: Respiratory complications will improve Outcome: Progressing Goal: Cardiovascular complication will be avoided Outcome: Progressing   Problem: Activity: Goal: Risk for activity intolerance will decrease Outcome: Progressing   Problem: Nutrition: Goal: Adequate nutrition will be maintained Outcome: Progressing   Problem: Education: Goal: Knowledge of General Education information will improve Description: Including pain rating scale, medication(s)/side effects and non-pharmacologic comfort measures Outcome: Completed/Met   Problem: Coping: Goal: Level of anxiety will decrease Outcome: Completed/Met

## 2019-05-16 NOTE — Progress Notes (Signed)
  Echocardiogram 2D Echocardiogram has been performed.  Clarence Maldonado 05/16/2019, 3:58 PM

## 2019-05-16 NOTE — ED Notes (Addendum)
Pt unable to give urine sample. Given crackers and water per Roel Cluck, MD

## 2019-05-16 NOTE — Progress Notes (Signed)
PROGRESS NOTE  Clarence Maldonado ZOX:[id number] DOB: 1946-02-19 DOA: 05/15/2019 PCP: Kermit Baloeed, Tiffany L, DO  HPI/Recap of past 24 hours: Clarence Maldonado is a 73 y.o. male with medical history significant of cardiomegaly, stroke, COPD,   CKD stage III, tobacco abuse, DM 2, HTN who presented to the emergency room on 8/10 with progressive weakness over the last 3 months to the point where he was falling over.  Called EMS on night of 8/10 after falling again.  No fevers or headaches.  Does report decreased appetite and has lost approximately 43 pounds in the past 2 months..  Denied any history of TB or exposure to TB.  In the emergency room, COVID test negative.  CT scan of chest noted multiple cavitary lesions of unclear etiology with a differential diagnosis of malignancy versus TB versus atypical infection.  Please admitted to the hospital service and placed on isolation precautions.  AFB sputum was ordered.  QuantiFERON-TB gold also ordered.  On the following day, patient noted to have increased heart rate and respiratory rate.  Seen by speech therapy who found signs of significant dysphagia and patient placed on restricted diet.  Seen by pulmonary who started the patient on IV antibiotics.  Assessment/Plan: Principal Problem: Cavitary pneumonia in patient with history of weight loss and found to have dysphagia, suspected aspiration pneumonia (HCC) versus TB versus malignancy: Continue airborne precautions.  Follow QuantiFERON gold and AFB sputum.  Procalcitonin level of 0.6.  On IV antibiotics, restricted diet.  If these tests are all inconclusive, bronchoscopy Active Problems:   Essential hypertension: Following blood pressure, continue home medications    CKD (chronic kidney disease) stage 3, GFR 30-59 ml/min (HCC): Improved with hydration.  Much closer to baseline    Hyperlipidemia    Tobacco use disorder: Continues to actively smoke.  Nicotine patch    Controlled type 2 diabetes mellitus  with stage 3 chronic kidney disease, without long-term current use of insulin (HCC): Stable.  Following sliding scale.  A1c at 6.3    Hypokalemia: Replacing as needed    Anemia: Hemoccult negative.  Suspect chronic disease   Code Status: Full code  Family Communication: Declined for me to call family  Disposition Plan: Awaiting diagnosis which will determine disposition   Consultants:  Pulmonary  Procedures:  None  Antimicrobials:  IV Unasyn and vancomycin 8/11-present  DVT prophylaxis: SCDs   Objective: Vitals:   05/16/19 1237 05/16/19 1300  BP: (!) 141/76 135/80  Pulse: (!) 104 98  Resp: (!) 22 (!) 22  Temp: 99.9 F (37.7 C) 99.7 F (37.6 C)  SpO2: 93% 92%    Intake/Output Summary (Last 24 hours) at 05/16/2019 1357 Last data filed at 05/15/2019 1853 Gross per 24 hour  Intake 1000 ml  Output --  Net 1000 ml   Filed Weights   05/15/19 1550 05/16/19 0629  Weight: 54.4 kg 54 kg   Body mass index is 19.21 kg/m.  Exam:   General: Alert and oriented x3, mild distress from cough  HEENT: Normocephalic and atraumatic, mucous membranes are slightly dry  Neck: Supple, no JVD  Cardiovascular: Regular rhythm, borderline tachycardia  Respiratory: Bilateral expiratory wheeze with few scattered rhonchi  Abdomen: Soft, nontender, nondistended, hypoactive bowel sounds  Musculoskeletal: No clubbing or cyanosis, trace pitting edema  Skin: No skin breaks, tears or lesions  Neuro: No focal deficits  Psychiatry: Appropriate, no evidence of psychoses   Data Reviewed: CBC: Recent Labs  Lab 05/15/19 1627 05/16/19 0426  WBC 6.3  7.8  NEUTROABS 5.4  --   HGB 9.4* 9.2*  HCT 31.8* 30.9*  MCV 76.6* 77.3*  PLT 240 215   Basic Metabolic Panel: Recent Labs  Lab 05/15/19 1627 05/16/19 0107 05/16/19 0426  NA 132*  --  132*  K 2.6*  --  3.4*  CL 98  --  100  CO2 23  --  24  GLUCOSE 135*  --  104*  BUN 27*  --  23  CREATININE 1.55*  --  1.34*  CALCIUM  8.0*  --  7.7*  MG 1.9  --  2.0  PHOS  --  2.4* 2.3*   GFR: Estimated Creatinine Clearance: 38.1 mL/min (A) (by C-G formula based on SCr of 1.34 mg/dL (H)). Liver Function Tests: Recent Labs  Lab 05/15/19 1627 05/16/19 0426  AST 87* 44*  ALT 33 26  ALKPHOS 123 102  BILITOT 0.9 0.8  PROT 6.1* 5.6*  ALBUMIN 2.1* 1.9*   No results for input(s): LIPASE, AMYLASE in the last 168 hours. No results for input(s): AMMONIA in the last 168 hours. Coagulation Profile: No results for input(s): INR, PROTIME in the last 168 hours. Cardiac Enzymes: Recent Labs  Lab 05/16/19 0107  CKTOTAL 45*   BNP (last 3 results) No results for input(s): PROBNP in the last 8760 hours. HbA1C: Recent Labs    05/16/19 0426  HGBA1C 6.3*   CBG: Recent Labs  Lab 05/16/19 0116 05/16/19 0428 05/16/19 0641 05/16/19 0804 05/16/19 1113  GLUCAP 90 80 80 73 71   Lipid Profile: No results for input(s): CHOL, HDL, LDLCALC, TRIG, CHOLHDL, LDLDIRECT in the last 72 hours. Thyroid Function Tests: Recent Labs    05/16/19 0426  TSH 0.253*   Anemia Panel: Recent Labs    05/16/19 0107  VITAMINB12 483  FOLATE 5.9*  FERRITIN 1,465*  TIBC 126*  IRON 12*  RETICCTPCT 1.1   Urine analysis:    Component Value Date/Time   COLORURINE YELLOW 05/16/2019 0216   APPEARANCEUR CLEAR 05/16/2019 0216   LABSPEC 1.032 (H) 05/16/2019 0216   PHURINE 5.0 05/16/2019 0216   GLUCOSEU NEGATIVE 05/16/2019 0216   HGBUR MODERATE (A) 05/16/2019 0216   BILIRUBINUR NEGATIVE 05/16/2019 0216   KETONESUR NEGATIVE 05/16/2019 0216   PROTEINUR NEGATIVE 05/16/2019 0216   NITRITE NEGATIVE 05/16/2019 0216   LEUKOCYTESUR NEGATIVE 05/16/2019 0216   Sepsis Labs: @LABRCNTIP (procalcitonin:4,lacticidven:4)  ) Recent Results (from the past 240 hour(s))  SARS Coronavirus 2 [facility] order, Performed in [facility] hospital lab) Nasopharyngeal Nasopharyngeal Swab     Status: None   Collection Time: 05/15/19  5:03 PM   Specimen:  Nasopharyngeal Swab  Result Value Ref Range Status   SARS Coronavirus 2 NEGATIVE NEGATIVE Final    Comment: (NOTE) If result is NEGATIVE SARS-CoV-2 target nucleic acids are NOT DETECTED. The SARS-CoV-2 RNA is generally detectable in upper and lower  respiratory specimens during the acute phase of infection. The lowest  concentration of SARS-CoV-2 viral copies this assay can detect is 250  copies / mL. A negative result does not preclude SARS-CoV-2 infection  and should not be used as the sole basis for treatment or other  patient management decisions.  A negative result may occur with  improper specimen collection / handling, submission of specimen other  than nasopharyngeal swab, presence of viral mutation(s) within the  areas targeted by this assay, and inadequate number of viral copies  (<250 copies / mL). A negative result must be combined with clinical  observations, patient history, and epidemiological  information. If result is POSITIVE SARS-CoV-2 target nucleic acids are DETECTED. The SARS-CoV-2 RNA is generally detectable in upper and lower  respiratory specimens dur ing the acute phase of infection.  Positive  results are indicative of active infection with SARS-CoV-2.  Clinical  correlation with patient history and other diagnostic information is  necessary to determine patient infection status.  Positive results do  not rule out bacterial infection or co-infection with other viruses. If result is PRESUMPTIVE POSTIVE SARS-CoV-2 nucleic acids MAY BE PRESENT.   A presumptive positive result was obtained on the submitted specimen  and confirmed on repeat testing.  While 2019 novel coronavirus  (SARS-CoV-2) nucleic acids may be present in the submitted sample  additional confirmatory testing may be necessary for epidemiological  and / or clinical management purposes  to differentiate between  SARS-CoV-2 and other Sarbecovirus currently known to infect humans.  If clinically  indicated additional testing with an alternate test  methodology [phone]) is advised. The SARS-CoV-2 RNA is generally  detectable in upper and lower respiratory sp ecimens during the acute  phase of infection. The expected result is Negative. Fact Sheet for Patients:  StrictlyIdeas.no Fact Sheet for Healthcare Providers: BankingDealers.co.za This test is not yet approved or cleared by the [place] FDA and has been authorized for detection and/or diagnosis of SARS-CoV-2 by FDA under an Emergency Use Authorization (EUA).  This EUA will remain in effect (meaning this test can be used) for the duration of the COVID-19 declaration under Section 564(b)(1) of the Act, 21 U.S.C. section 360bbb-3(b)(1), unless the authorization is terminated or revoked sooner. Performed at [facility], [place] [address] [place], [place] [address]       Studies: Dg Chest 2 View  Result Date: 05/15/2019 CLINICAL DATA:  EMS reports from home, lives alone, friend called for failure to thrive for three months. Weakness and recent fall this a.m. Smoker. Productive cough. Hx of Cardiomegaly and Type II Diabetes. EXAM: CHEST - 2 VIEW COMPARISON:  Chest CT, 09/07/2011 FINDINGS: There are numerous ill-defined bilateral lung nodules, more numerous on the left, with intervening hazy ground-glass type opacities. Lungs are hyperexpanded. There is an emphysematous bleb in the right upper lobe measuring approximately 6 cm. No pleural effusion.  No pneumothorax. Cardiac silhouette is normal in size.  No mediastinal masses. Skeletal structures are intact. IMPRESSION: 1. Multiple bilateral ill-defined nodular type opacities with intervening ground-glass opacities, greater throughout the left lung than on the right. Findings may be due to multifocal infection or inflammation. Metastatic disease is also in the differential diagnosis. Recommend follow-up assessment with  chest CT with contrast. Electronically Signed   By: Lajean Manes M.D.   On: 05/15/2019 19:24   Ct Chest W Contrast  Result Date: 05/15/2019 CLINICAL DATA:  Dyspnea, chronic, failure to thrive EXAM: CT CHEST WITH CONTRAST TECHNIQUE: Multidetector CT imaging of the chest was performed during intravenous contrast administration. CONTRAST:  52mL OMNIPAQUE IOHEXOL 300 MG/ML  SOLN COMPARISON:  Same day chest radiograph, CT chest 09/07/2011 FINDINGS: Cardiovascular: Normal heart size. No pericardial effusion. Atherosclerotic plaque within the normal caliber aorta. Atherosclerotic calcification of the coronary arteries. Calcifications of the mitral annulus Mediastinum/Nodes: No enlarged mediastinal or axillary lymph nodes. Thyroid gland, trachea, and esophagus demonstrate no significant findings. Lungs/Pleura: Widespread randomly distributed, apically predominant consolidative opacities with central cavitation are present throughout both hemithoraces. A larger thick-walled cystic lesion is present in the right upper lobe measuring up to 6.3 cm in size. This larger focus corresponds well  to an area ground-glass opacity on CT from 2012. No pneumothorax. No effusion. Upper Abdomen: Atrophic appearance of the left kidney. Few fluid attenuation cyst in the right kidney. Dependently layering gallstone within the otherwise normal gallbladder. Small hiatal hernia. Accessory splenule. Subcentimeter hypoattenuating foci in the spleen too small to fully characterize on CT imaging but statistically likely benign. Musculoskeletal: Multilevel degenerative changes are present in the imaged portions of the spine. IMPRESSION: Widespread nodular and cavitary lesions throughout both lungs with a larger air-filled cystic lesion in the right upper lobe. Overall appearance is nonspecific and differential could include atypical infection such as tuberculosis which requires exclusion versus other considerations including cavitating malignancy  such as diffuse squamous cell or septic pulmonary emboli. Sputum cultures and tissue sampling may provide a more definitive diagnosis. Aortic Atherosclerosis (ICD10-I70.0). Electronically Signed   By: Kreg ShropshirePrice  DeHay M.D.   On: 05/15/2019 20:39   Vas [place] Lower Extremity Venous (dvt)  Result Date: 05/16/2019  Lower Venous Study Indications: Edema.  Limitations: Patient positioning. Comparison Study: no prior Performing Technologist: Blanch MediaMegan Riddle RVS  Examination Guidelines: A complete evaluation includes B-mode imaging, spectral Doppler, color Doppler, and power Doppler as needed of all accessible portions of each vessel. Bilateral testing is considered an integral part of a complete examination. Limited examinations for reoccurring indications may be performed as noted.  +---------+---------------+---------+-----------+----------+-------+  RIGHT     Compressibility Phasicity Spontaneity Properties Summary  +---------+---------------+---------+-----------+----------+-------+  CFV       Full            Yes       Yes                             +---------+---------------+---------+-----------+----------+-------+  SFJ       Full                                                      +---------+---------------+---------+-----------+----------+-------+  FV Prox   Full                                                      +---------+---------------+---------+-----------+----------+-------+  FV Mid    Full                                                      +---------+---------------+---------+-----------+----------+-------+  FV Distal Full                                                      +---------+---------------+---------+-----------+----------+-------+  PFV       Full                                                      +---------+---------------+---------+-----------+----------+-------+  POP       Full            Yes       Yes                              +---------+---------------+---------+-----------+----------+-------+  PTV       Full                                                      +---------+---------------+---------+-----------+----------+-------+  PERO      Full                                                      +---------+---------------+---------+-----------+----------+-------+     Summary: Right: There is no evidence of deep vein thrombosis in the lower extremity. No cystic structure found in the popliteal fossa.  *See table(s) above for measurements and observations.    Preliminary     Scheduled Meds:  buPROPion  150 mg Oral Daily   feeding supplement (ENSURE ENLIVE)  237 mL Oral BID BM   insulin aspart  0-9 Units Subcutaneous Q4H   mouth rinse  15 mL Mouth Rinse BID   mometasone-formoterol  2 puff Inhalation BID   multivitamin with minerals  1 tablet Oral Daily   pravastatin  20 mg Oral q1800   [START ON 05/17/2019] thiamine  100 mg Oral Daily    Continuous Infusions:  sodium chloride 75 mL/hr at 05/16/19 1218   ampicillin-sulbactam (UNASYN) IV 1.5 g (05/16/19 1213)   vancomycin 1,250 mg (05/16/19 1234)   [START ON 05/18/2019] vancomycin       LOS: 1 day     Hollice EspySendil K Mena Lienau, MD [facility]  To reach me or the doctor on call, go to: [email or web address] Password [email or web address]  05/16/2019, 1:57 PM

## 2019-05-16 NOTE — Evaluation (Signed)
Physical Therapy Evaluation Patient Details Name: Clarence Maldonado MRN: [id number] DOB: 02/04/1946 Today's Date: 05/16/2019   History of Present Illness  Clarence Maldonado is a 73 y.o. male with medical history significant of cardiomegaly, stroke, COPD,   CKD stage III, tobacco abuse, DM 2, HTN who presented to the emergency room on 8/10 with progressive weakness over the last 3 months to the point where he was falling over. He has lost approximately 43 pounds in the past 2 months. CT scan of chest noted multiple cavitary lesions of unclear etiology with a differential diagnosis of malignancy versus TB versus atypical infection.  Clinical Impression  Pt admitted with above diagnosis. Pt currently with functional limitations due to the deficits listed below (see PT Problem List). Pt will benefit from skilled PT to increase their independence and safety with mobility to allow discharge to the venue listed below.  Pt was happy to get out of the bed, but fatigued quickly with transfer to the recliner.  He was moving at MOD to MIN A, but unable to ambulate due to fatigue. Recommend SNF as he lives in 2nd floor apartment with limited assistance.     Follow Up Recommendations SNF    Equipment Recommendations  None recommended by PT    Recommendations for Other Services       Precautions / Restrictions Precautions Precautions: Fall Precaution Comments: reports 4 falls recently      Mobility  Bed Mobility Overal bed mobility: Needs Assistance Bed Mobility: Supine to Sit     Supine to sit: Min assist;Mod assist     General bed mobility comments: MIN/MOD A with use of bed pad to get fully turned to EOB.  Transfers Overall transfer level: Needs assistance Equipment used: Rolling walker (2 wheeled) Transfers: Sit to/from [facility] Sit to Stand: Min assist Stand pivot transfers: Min assist       General transfer comment: MIN A to power up and to turn in front of  chair.  Was going to have pt try and march in place, but too fatigued and had to sit. o2 94% on RA  Ambulation/Gait             General Gait Details: Pt too fatigued from SPT.  Stairs            Wheelchair Mobility    Modified Rankin (Stroke Patients Only)       Balance Overall balance assessment: History of Falls;Needs assistance   Sitting balance-Leahy Scale: Fair       Standing balance-Leahy Scale: Poor                               Pertinent Vitals/Pain Pain Assessment: No/denies pain    Home Living Family/patient expects to be discharged to:: Private residence Living Arrangements: Alone   Type of Home: Apartment Home Access: Stairs to enter   Technical brewer of Steps: flight Home Layout: One level Home Equipment: None      Prior Function                 Hand Dominance        Extremity/Trunk Assessment   Upper Extremity Assessment Upper Extremity Assessment: Generalized weakness    Lower Extremity Assessment Lower Extremity Assessment: Generalized weakness       Communication   Communication: HOH  Cognition Arousal/Alertness: Awake/alert Behavior During Therapy: WFL for tasks assessed/performed Overall Cognitive Status: Within Functional Limits for  tasks assessed Area of Impairment: Orientation;Following commands;Problem solving                 Orientation Level: Disoriented to;Time     Following Commands: Follows one step commands consistently     Problem Solving: Requires verbal cues General Comments: Pt didn't know what month it was.      General Comments      Exercises     Assessment/Plan    PT Assessment Patient needs continued PT services  PT Problem List Decreased strength;Decreased activity tolerance;Decreased balance;Decreased coordination;Decreased mobility;Decreased knowledge of use of DME       PT Treatment Interventions DME instruction;Gait training;Functional mobility  training;Balance training;Therapeutic exercise;Therapeutic activities    PT Goals (Current goals can be found in the Care Plan section)  Acute Rehab PT Goals Patient Stated Goal: Pt was agreeable to get out of bed PT Goal Formulation: With patient Time For Goal Achievement: 05/30/19 Potential to Achieve Goals: Good    Frequency Min 2X/week   Barriers to discharge Decreased caregiver support;Inaccessible home environment lives alone in 2nd floor apartment with limited Assistance    Co-evaluation               AM-PAC PT "6 Clicks" Mobility  Outcome Measure Help needed turning from your back to your side while in a flat bed without using bedrails?: A Little Help needed moving from lying on your back to sitting on the side of a flat bed without using bedrails?: A Little Help needed moving to and from a bed to a chair (including a wheelchair)?: A Little Help needed standing up from a chair using your arms (e.g., wheelchair or bedside chair)?: A Little Help needed to walk in hospital room?: Total Help needed climbing 3-5 steps with a railing? : Total 6 Click Score: 14    End of Session Equipment Utilized During Treatment: Gait belt Activity Tolerance: Patient limited by fatigue Patient left: in chair;with call bell/phone within reach;with chair alarm set Nurse Communication: Mobility status PT Visit Diagnosis: Unsteadiness on feet (R26.81);Muscle weakness (generalized) (M62.81);Difficulty in walking, not elsewhere classified (R26.2);Adult, failure to thrive (R62.7)    Time: 0240-9735 PT Time Calculation (min) (ACUTE ONLY): 30 min   Charges:   PT Evaluation $PT Eval Moderate Complexity: 1 Mod PT Treatments $Therapeutic Activity: 8-22 mins        Clarence Maldonado, [place] Pager [phone] 05/16/2019   [name] 05/16/2019, 2:53 PM

## 2019-05-16 NOTE — ED Notes (Signed)
ED TO INPATIENT HANDOFF REPORT  Name/Age/Gender Clarence Maldonado 73 y.o. male  Code Status    Code Status Orders  (From admission, onward)         Start     Ordered   05/16/19 0038  Full code  Continuous     05/16/19 0038        Code Status History    This patient has a current code status but no historical code status.   Advance Care Planning Activity      Home/SNF/Other Home  Chief Complaint Weakness   Level of Care/Admitting Diagnosis ED Disposition    ED Disposition Condition [facility]: [place] [[id number]]  Level of Care: Telemetry [5]  Admit to tele based on following criteria: Other see comments  Comments: possible embolic dz  Covid Evaluation: Confirmed COVID Negative  Diagnosis: Cavitary lesion of lung [[id number]]  Admitting Physician: Toy Baker [[id number]]  Attending Physician: Toy Baker [[id number]]  Estimated length of stay: 3 - 4 days  Certification:: I certify this patient will need inpatient services for at least 2 midnights  Bed request comments: airborn  PT Class (Do Not Modify): Inpatient [101]  PT Acc Code (Do Not Modify): Private [1]       Medical History Past Medical History:  Diagnosis Date  . Abnormal CT of the chest 2009   nodule 9 x 6 mm RUL  . Benign localized hyperplasia of prostate with urinary obstruction and other lower urinary tract symptoms (LUTS)(600.21)   . Cardiomegaly   . Cerebrovascular accident (stroke) ([place]) 2009   lacunar infarct in the left thalamus  . Cholelithiasis   . Chronic airway obstruction, not elsewhere classified   . Chronic kidney disease, stage III (moderate) (HCC)   . Closed femur fracture ([place]) 1960  . Illiterate   . Obesity, unspecified   . Other and unspecified hyperlipidemia   . Tobacco use disorder   . Type II or unspecified type diabetes mellitus with renal manifestations, uncontrolled(250.42)   . Unspecified essential hypertension      Allergies No Known Allergies  IV Location/Drains/Wounds Patient Lines/Drains/Airways Status   Active Line/Drains/Airways    Name:   Placement date:   Placement time:   Site:   Days:   Peripheral IV 05/15/19 Anterior;Left Forearm   05/15/19    1552    Forearm   1   Peripheral IV 05/16/19 Right Antecubital   05/16/19    0122    Antecubital   less than 1   Peripheral IV 05/16/19 Left Antecubital   05/16/19    0122    Antecubital   less than 1          Labs/Imaging Results for orders placed or performed during the hospital encounter of 05/15/19 (from the past 48 hour(s))  Comprehensive metabolic panel     Status: Abnormal   Collection Time: 05/15/19  4:27 PM  Result Value Ref Range   Sodium 132 (L) 135 - 145 mmol/L   Potassium 2.6 (LL) 3.5 - 5.1 mmol/L    Comment: CRITICAL RESULT CALLED TO, READ BACK BY AND VERIFIED WITH: BOWLEN,M. RN @1732  ON 08.10.2020 BY COHEN,K    Chloride 98 98 - 111 mmol/L   CO2 23 22 - 32 mmol/L   Glucose, Bld 135 (H) 70 - 99 mg/dL   BUN 27 (H) 8 - 23 mg/dL   Creatinine, Ser 1.55 (H) 0.61 - 1.24 mg/dL   Calcium 8.0 (L) 8.9 -  10.3 mg/dL   Total Protein 6.1 (L) 6.5 - 8.1 g/dL   Albumin 2.1 (L) 3.5 - 5.0 g/dL   AST 87 (H) 15 - 41 U/L   ALT 33 0 - 44 U/L   Alkaline Phosphatase 123 38 - 126 U/L   Total Bilirubin 0.9 0.3 - 1.2 mg/dL   GFR calc non Af Amer 44 (L) >60 mL/min   GFR calc Af Amer 51 (L) >60 mL/min   Anion gap 11 5 - 15    Comment: Performed at [facility], [address] [place], [place] [address]  CBC with Differential     Status: Abnormal   Collection Time: 05/15/19  4:27 PM  Result Value Ref Range   WBC 6.3 4.0 - 10.5 K/uL   RBC 4.15 (L) 4.22 - 5.81 MIL/uL   Hemoglobin 9.4 (L) 13.0 - 17.0 g/dL   HCT 60.431.8 (L) 54.039.0 - 98.152.0 %   MCV 76.6 (L) 80.0 - 100.0 fL   MCH 22.7 (L) 26.0 - 34.0 pg   MCHC 29.6 (L) 30.0 - 36.0 g/dL   RDW 19.116.6 (H) 47.811.5 - 29.515.5 %   Platelets 240 150 - 400 K/uL   nRBC 0.0 0.0 - 0.2 %    Neutrophils Relative % 85 %   Neutro Abs 5.4 1.7 - 7.7 K/uL   Lymphocytes Relative 9 %   Lymphs Abs 0.6 (L) 0.7 - 4.0 K/uL   Monocytes Relative 5 %   Monocytes Absolute 0.3 0.1 - 1.0 K/uL   Eosinophils Relative 0 %   Eosinophils Absolute 0.0 0.0 - 0.5 K/uL   Basophils Relative 0 %   Basophils Absolute 0.0 0.0 - 0.1 K/uL   Immature Granulocytes 1 %   Abs Immature Granulocytes 0.03 0.00 - 0.07 K/uL    Comment: Performed at [facility], [address] [place], [place] [address]  Magnesium     Status: None   Collection Time: 05/15/19  4:27 PM  Result Value Ref Range   Magnesium 1.9 1.7 - 2.4 mg/dL    Comment: Performed at [facility], [address] [place], [place] [address]  SARS Coronavirus 2 [facility](Hospital order, Performed in [facility] hospital lab) Nasopharyngeal Nasopharyngeal Swab     Status: None   Collection Time: 05/15/19  5:03 PM   Specimen: Nasopharyngeal Swab  Result Value Ref Range   SARS Coronavirus 2 NEGATIVE NEGATIVE    Comment: (NOTE) If result is NEGATIVE SARS-CoV-2 target nucleic acids are NOT DETECTED. The SARS-CoV-2 RNA is generally detectable in upper and lower  respiratory specimens during the acute phase of infection. The lowest  concentration of SARS-CoV-2 viral copies this assay can detect is 250  copies / mL. A negative result does not preclude SARS-CoV-2 infection  and should not be used as the sole basis for treatment or other  patient management decisions.  A negative result may occur with  improper specimen collection / handling, submission of specimen other  than nasopharyngeal swab, presence of viral mutation(s) within the  areas targeted by this assay, and inadequate number of viral copies  (<250 copies / mL). A negative result must be combined with clinical  observations, patient history, and epidemiological information. If result is POSITIVE SARS-CoV-2 target nucleic acids are DETECTED. The SARS-CoV-2 RNA  is generally detectable in upper and lower  respiratory specimens dur ing the acute phase of infection.  Positive  results are indicative of active infection with SARS-CoV-2.  Clinical  correlation with  patient history and other diagnostic information is  necessary to determine patient infection status.  Positive results do  not rule out bacterial infection or co-infection with other viruses. If result is PRESUMPTIVE POSTIVE SARS-CoV-2 nucleic acids MAY BE PRESENT.   A presumptive positive result was obtained on the submitted specimen  and confirmed on repeat testing.  While 2019 novel coronavirus  (SARS-CoV-2) nucleic acids may be present in the submitted sample  additional confirmatory testing may be necessary for epidemiological  and / or clinical management purposes  to differentiate between  SARS-CoV-2 and other Sarbecovirus currently known to infect humans.  If clinically indicated additional testing with an alternate test  methodology [phone]) is advised. The SARS-CoV-2 RNA is generally  detectable in upper and lower respiratory sp ecimens during the acute  phase of infection. The expected result is Negative. Fact Sheet for Patients:  BoilerBrush.com.cy Fact Sheet for Healthcare Providers: https://pope.com/ This test is not yet approved or cleared by the [place] FDA and has been authorized for detection and/or diagnosis of SARS-CoV-2 by FDA under an Emergency Use Authorization (EUA).  This EUA will remain in effect (meaning this test can be used) for the duration of the COVID-19 declaration under Section 564(b)(1) of the Act, 21 U.S.C. section 360bbb-3(b)(1), unless the authorization is terminated or revoked sooner. Performed at [facility], [address], [place], [place] [address]   POC occult blood, ED     Status: None   Collection Time: 05/15/19  7:48 PM  Result Value Ref Range   Fecal Occult  Bld NEGATIVE NEGATIVE  CK     Status: Abnormal   Collection Time: 05/16/19  1:07 AM  Result Value Ref Range   Total CK 45 (L) 49 - 397 U/L    Comment: Performed at [facility], [address] [place], [place] [address]  Phosphorus     Status: Abnormal   Collection Time: 05/16/19  1:07 AM  Result Value Ref Range   Phosphorus 2.4 (L) 2.5 - 4.6 mg/dL    Comment: Performed at [facility], [address] [place], [place] [address]  Vitamin B12     Status: None   Collection Time: 05/16/19  1:07 AM  Result Value Ref Range   Vitamin B-12 483 180 - 914 pg/mL    Comment: (NOTE) This assay is not validated for testing neonatal or myeloproliferative syndrome specimens for Vitamin B12 levels. Performed at [facility], [address] [place], [place] [address]   Folate     Status: Abnormal   Collection Time: 05/16/19  1:07 AM  Result Value Ref Range   Folate 5.9 (L) >5.9 ng/mL    Comment: Performed at [facility], [address], [place], [place] [address]  Iron and TIBC     Status: Abnormal   Collection Time: 05/16/19  1:07 AM  Result Value Ref Range   Iron 12 (L) 45 - 182 ug/dL   TIBC 748 (L) 270 - 786 ug/dL   Saturation Ratios 10 (L) 17.9 - 39.5 %   UIBC 114 ug/dL    Comment: Performed at [facility], [address], [place], [place] [address]  Ferritin     Status: Abnormal   Collection Time: 05/16/19  1:07 AM  Result Value Ref Range   Ferritin 1,465 (H) 24 - 336 ng/mL    Comment: Performed at [facility], [address] [place], [place] [address]  Reticulocytes  Status: Abnormal   Collection Time: 05/16/19  1:07 AM  Result Value Ref Range   Retic Ct Pct 1.1 0.4 - 3.1 %   RBC. 4.34 4.22 - 5.81 MIL/uL   Retic Count, Absolute 45.6 19.0 - 186.0 K/uL   Immature Retic Fract 17.3 (H) 2.3 - 15.9 %    Comment: Performed at [facility], [address] [place], [place] [address]  CBG monitoring, ED     Status: None   Collection Time: 05/16/19  1:16 AM  Result Value Ref Range   Glucose-Capillary 90 70 - 99 mg/dL  Urinalysis, Routine w reflex microscopic     Status: Abnormal   Collection Time: 05/16/19  2:16 AM  Result Value Ref Range   Color, Urine YELLOW YELLOW   APPearance CLEAR CLEAR   Specific Gravity, Urine 1.032 (H) 1.005 - 1.030   pH 5.0 5.0 - 8.0   Glucose, UA NEGATIVE NEGATIVE mg/dL   Hgb urine dipstick MODERATE (A) NEGATIVE   Bilirubin Urine NEGATIVE NEGATIVE   Ketones, ur NEGATIVE NEGATIVE mg/dL   Protein, ur NEGATIVE NEGATIVE mg/dL   Nitrite NEGATIVE NEGATIVE   Leukocytes,Ua NEGATIVE NEGATIVE   RBC / HPF 11-20 0 - 5 RBC/hpf   WBC, UA 0-5 0 - 5 WBC/hpf   Bacteria, UA RARE (A) NONE SEEN   Mucus PRESENT     Comment: Performed at [facility], [address] [place], [place] [address]  Prealbumin     Status: Abnormal   Collection Time: 05/16/19  4:26 AM  Result Value Ref Range   Prealbumin <5 (L) 18 - 38 mg/dL    Comment: Performed at [facility], [address] [place], [place] [address]  Magnesium     Status: None   Collection Time: 05/16/19  4:26 AM  Result Value Ref Range   Magnesium 2.0 1.7 - 2.4 mg/dL    Comment: Performed at [facility], [address] [place], [place] [address]  Phosphorus     Status: Abnormal   Collection Time: 05/16/19  4:26 AM  Result Value Ref Range   Phosphorus 2.3 (L) 2.5 - 4.6 mg/dL    Comment: Performed at [facility], [address] [place], [place] [address]  Comprehensive metabolic panel     Status: Abnormal   Collection Time: 05/16/19  4:26 AM  Result Value Ref Range   Sodium 132 (L) 135 - 145 mmol/L   Potassium 3.4 (L) 3.5 - 5.1 mmol/L    Comment: NO VISIBLE HEMOLYSIS DELTA CHECK NOTED    Chloride 100 98 - 111 mmol/L   CO2 24 22 - 32 mmol/L   Glucose, Bld 104 (H) 70 - 99 mg/dL    BUN 23 8 - 23 mg/dL   Creatinine, Ser 6.961.34 (H) 0.61 - 1.24 mg/dL   Calcium 7.7 (L) 8.9 - 10.3 mg/dL   Total Protein 5.6 (L) 6.5 - 8.1 g/dL   Albumin 1.9 (L) 3.5 - 5.0 g/dL   AST 44 (H) 15 - 41 U/L   ALT 26 0 - 44 U/L   Alkaline Phosphatase 102 38 - 126 U/L   Total Bilirubin 0.8 0.3 - 1.2 mg/dL   GFR calc non Af Amer 53 (L) >60 mL/min   GFR calc Af Amer >60 >60 mL/min   Anion gap 8 5 - 15    Comment: Performed at [facility], [address] [place], [place] [address]  CBC  Status: Abnormal   Collection Time: 05/16/19  4:26 AM  Result Value Ref Range   WBC 7.8 4.0 - 10.5 K/uL   RBC 4.00 (L) 4.22 - 5.81 MIL/uL   Hemoglobin 9.2 (L) 13.0 - 17.0 g/dL   HCT 16.1 (L) 09.6 - 04.5 %   MCV 77.3 (L) 80.0 - 100.0 fL   MCH 23.0 (L) 26.0 - 34.0 pg   MCHC 29.8 (L) 30.0 - 36.0 g/dL   RDW 40.9 (H) 81.1 - 91.4 %   Platelets 215 150 - 400 K/uL   nRBC 0.0 0.0 - 0.2 %    Comment: Performed at [facility], [address] [place], [place] [address]  CBG monitoring, ED     Status: None   Collection Time: 05/16/19  4:28 AM  Result Value Ref Range   Glucose-Capillary 80 70 - 99 mg/dL   Dg Chest 2 View  Result Date: 05/15/2019 CLINICAL DATA:  EMS reports from home, lives alone, friend called for failure to thrive for three months. Weakness and recent fall this a.m. Smoker. Productive cough. Hx of Cardiomegaly and Type II Diabetes. EXAM: CHEST - 2 VIEW COMPARISON:  Chest CT, 09/07/2011 FINDINGS: There are numerous ill-defined bilateral lung nodules, more numerous on the left, with intervening hazy ground-glass type opacities. Lungs are hyperexpanded. There is an emphysematous bleb in the right upper lobe measuring approximately 6 cm. No pleural effusion.  No pneumothorax. Cardiac silhouette is normal in size.  No mediastinal masses. Skeletal structures are intact. IMPRESSION: 1. Multiple bilateral ill-defined nodular type opacities with intervening ground-glass  opacities, greater throughout the left lung than on the right. Findings may be due to multifocal infection or inflammation. Metastatic disease is also in the differential diagnosis. Recommend follow-up assessment with chest CT with contrast. Electronically Signed   By: Amie Portland M.D.   On: 05/15/2019 19:24   Ct Chest W Contrast  Result Date: 05/15/2019 CLINICAL DATA:  Dyspnea, chronic, failure to thrive EXAM: CT CHEST WITH CONTRAST TECHNIQUE: Multidetector CT imaging of the chest was performed during intravenous contrast administration. CONTRAST:  60mL OMNIPAQUE IOHEXOL 300 MG/ML  SOLN COMPARISON:  Same day chest radiograph, CT chest 09/07/2011 FINDINGS: Cardiovascular: Normal heart size. No pericardial effusion. Atherosclerotic plaque within the normal caliber aorta. Atherosclerotic calcification of the coronary arteries. Calcifications of the mitral annulus Mediastinum/Nodes: No enlarged mediastinal or axillary lymph nodes. Thyroid gland, trachea, and esophagus demonstrate no significant findings. Lungs/Pleura: Widespread randomly distributed, apically predominant consolidative opacities with central cavitation are present throughout both hemithoraces. A larger thick-walled cystic lesion is present in the right upper lobe measuring up to 6.3 cm in size. This larger focus corresponds well to an area ground-glass opacity on CT from 2012. No pneumothorax. No effusion. Upper Abdomen: Atrophic appearance of the left kidney. Few fluid attenuation cyst in the right kidney. Dependently layering gallstone within the otherwise normal gallbladder. Small hiatal hernia. Accessory splenule. Subcentimeter hypoattenuating foci in the spleen too small to fully characterize on CT imaging but statistically likely benign. Musculoskeletal: Multilevel degenerative changes are present in the imaged portions of the spine. IMPRESSION: Widespread nodular and cavitary lesions throughout both lungs with a larger air-filled cystic  lesion in the right upper lobe. Overall appearance is nonspecific and differential could include atypical infection such as tuberculosis which requires exclusion versus other considerations including cavitating malignancy such as diffuse squamous cell or septic pulmonary emboli. Sputum cultures and tissue sampling may provide a more definitive diagnosis. Aortic Atherosclerosis (ICD10-I70.0). Electronically Signed  By: Kreg ShropshirePrice  DeHay M.D.   On: 05/15/2019 20:39    Pending Labs Unresulted Labs (From admission, onward)    Start     Ordered   05/16/19 0500  Hemoglobin A1c  Tomorrow morning,   R    Comments: To assess prior glycemic control    05/16/19 0038   05/16/19 0500  TSH  Once,   STAT    Comments: Cancel if already done within 1 month and notify MD    05/16/19 0038   05/16/19 0417  QuantiFERON-TB Gold Plus  Once,   R     05/16/19 0417   05/15/19 2332  Culture, blood (Routine X 2) w Reflex to ID Panel  BLOOD CULTURE X 2,   R (with STAT occurrences)     05/15/19 2331   05/15/19 2134  Acid Fast Smear (AFB)  (AFB smear + Culture w reflexed sensitivities panel)  Once,   STAT    Question:  Patient immune status  Answer:  Normal   05/15/19 2133   05/15/19 2134  Acid Fast Culture with reflexed sensitivities  (AFB smear + Culture w reflexed sensitivities panel)  Once,   STAT    Question:  Patient immune status  Answer:  Normal   05/15/19 2133   Unscheduled  Occult blood card to lab, stool RN will collect  As needed,   R    Question:  Specimen to be collected by:  Answer:  RN will collect   05/15/19 2128          Vitals/Pain Today's Vitals   05/16/19 0000 05/16/19 0158 05/16/19 0330 05/16/19 0400  BP: 112/84 (!) 145/90  140/76  Pulse: 85 97  85  Resp: 17 (!) 22  (!) 21  Temp:      TempSrc:      SpO2: 92% 94% 93% (!) 89%  Weight:      Height:      PainSc:        Isolation Precautions Airborne and Contact precautions  Medications Medications  sodium chloride (PF) 0.9 %  injection (has no administration in time range)  pravastatin (PRAVACHOL) tablet 20 mg (has no administration in time range)  buPROPion (WELLBUTRIN XL) 24 hr tablet 150 mg (has no administration in time range)  mometasone-formoterol (DULERA) 200-5 MCG/ACT inhaler 2 puff (2 puffs Inhalation Not Given 05/16/19 0228)  acetaminophen (TYLENOL) tablet 650 mg (has no administration in time range)    Or  acetaminophen (TYLENOL) suppository 650 mg (has no administration in time range)  HYDROcodone-acetaminophen (NORCO/VICODIN) 5-325 MG per tablet 1-2 tablet (has no administration in time range)  ondansetron (ZOFRAN) tablet 4 mg (has no administration in time range)    Or  ondansetron (ZOFRAN) injection 4 mg (has no administration in time range)  insulin aspart (novoLOG) injection 0-9 Units (0 Units Subcutaneous Not Given 05/16/19 0429)  0.9 %  sodium chloride infusion ( Intravenous New Bag/Given 05/16/19 0109)  thiamine (B-1) injection 100 mg (has no administration in time range)  sodium chloride 0.9 % bolus 1,000 mL (0 mLs Intravenous Stopped 05/15/19 1853)  potassium chloride 10 mEq in 100 mL IVPB (0 mEq Intravenous Stopped 05/15/19 1957)  iohexol (OMNIPAQUE) 300 MG/ML solution 75 mL (60 mLs Intravenous Contrast Given 05/15/19 2004)  potassium chloride 10 mEq in 100 mL IVPB (0 mEq Intravenous Stopped 05/16/19 0521)  potassium chloride SA (K-DUR) CR tablet 40 mEq (40 mEq Oral Given 05/16/19 0110)    Mobility walks with person assist

## 2019-05-16 NOTE — Evaluation (Signed)
Clinical/Bedside Swallow Evaluation Patient Details  Name: Clarence Maldonado MRN: [id number] Date of Birth: 1946-07-06  Today's Date: 05/16/2019 Time: SLP Start Time (ACUTE ONLY): 1130 SLP Stop Time (ACUTE ONLY): 1205 SLP Time Calculation (min) (ACUTE ONLY): 35 min  Past Medical History:  Past Medical History:  Diagnosis Date  . Abnormal CT of the chest 2009   nodule 9 x 6 mm RUL  . Benign localized hyperplasia of prostate with urinary obstruction and other lower urinary tract symptoms (LUTS)(600.21)   . Cardiomegaly   . Cerebrovascular accident (stroke) (HCC) 2009   lacunar infarct in the left thalamus  . Cholelithiasis   . Chronic airway obstruction, not elsewhere classified   . Chronic kidney disease, stage III (moderate) (HCC)   . Closed femur fracture (HCC) 1960  . Illiterate   . Obesity, unspecified   . Other and unspecified hyperlipidemia   . Tobacco use disorder   . Type II or unspecified type diabetes mellitus with renal manifestations, uncontrolled(250.42)   . Unspecified essential hypertension    Past Surgical History:  Past Surgical History:  Procedure Laterality Date  . FEMUR FRACTURE SURGERY Left 1969   HPI:  73 year old male admitted 05/15/2019 with progressive weakness and cough, failure to thrive. Pt lives alone. PMH: cardiomegaly, CVA (2009), COPD, HTN, DM, obesity, CKD III, tobacco abuse. Lung sounds: some scattered rhonchi. Course rhonchus cough.  CXR = bilateral infiltrates. Chest CT = bilateral diffuse nodular infiltrates with cavitation and a large right upper lobe cystic lesion   Assessment / Plan / Recommendation Clinical Impression  Pt presents with adequate oral strength and function. He is edentulous, and reports his dentures are at home. Pt indicates poor tolerance of cold and sweet items, which causes "everything locks up", and/or pain. BSE was limited, due to ongoing congested productive cough, pt report of pain with swallow, as well as poor tolerance  of cold and sweet items.   Thin liquid trials resulted in reflexive cough, different from congested cough noted prior to po intake. Nectar thick liquid and puree trials appeared to be tolerated well, however, pt exhibited cough throughout this evaluation, making identification of airway compromise impossible at bedside. Unfortunately, pt is unable to travel for instrumental assessment at this time. Will change diet to a more conservative puree and nectar thick liquid, and continue to follow for diet tolerance and education until pt is able to participate in MBS. Suction is recommended for secretion management and to facilitate effective oral care. RN and MD informed of results and recommendations. Safe swallow precautions and suction supplies were given to RN for pt. SLP will follow up at bedside.   SLP Visit Diagnosis: Dysphagia, unspecified (R13.10)    Aspiration Risk  Moderate aspiration risk;Severe aspiration risk;Risk for inadequate nutrition/hydration    Diet Recommendation Nectar-thick liquid;Dysphagia 1 (Puree)   Liquid Administration via: Cup;Straw Medication Administration: Whole meds with liquid Supervision: Staff to assist with self feeding;Patient able to self feed;Intermittent supervision to cue for compensatory strategies Compensations: Minimize environmental distractions;Slow rate;Small sips/bites Postural Changes: Seated upright at 90 degrees;Remain upright for at least 30 minutes after po intake    Other  Recommendations Recommended Consults: Consider esophageal assessment Oral Care Recommendations: Oral care BID Other Recommendations: Have oral suction available;Order thickener from pharmacy   Follow up Recommendations Other (comment)(TBD)      Frequency and Duration min 1 x/week  2 weeks;1 week       Prognosis Prognosis for Safe Diet Advancement: Fair  Swallow Study   General Date of Onset: 05/15/19 HPI: 73 year old male admitted 05/15/2019 with progressive  weakness and cough, failure to thrive. Pt lives alone. PMH: cardiomegaly, CVA (2009), COPD, HTN, DM, obesity, CKD III, tobacco abuse. Lung sounds: some scattered rhonchi. Course rhonchus cough.  CXR = bilateral infiltrates. Chest CT = bilateral diffuse nodular infiltrates with cavitation and a large right upper lobe cystic lesion Type of Study: Bedside Swallow Evaluation Previous Swallow Assessment: no prior ST intervention, however, pt reports solid food dysphagia. No prior esophageal work up found Diet Prior to this Study: Regular;Thin liquids Temperature Spikes Noted: No Respiratory Status: Room air History of Recent Intubation: No Behavior/Cognition: Alert;Cooperative;Pleasant mood Oral Cavity Assessment: Within Functional Limits Oral Care Completed by SLP: No(suction equipment given to RN) Oral Cavity - Dentition: Dentures, not available;Edentulous Vision: Functional for self-feeding Self-Feeding Abilities: Total assist Patient Positioning: Upright in bed Baseline Vocal Quality: Normal Volitional Cough: Wet;Congested Volitional Swallow: Able to elicit    Oral/Motor/Sensory Function     Ice Chips Ice chips: Not tested(Pt reports he cannot tolerate cold)   Thin Liquid Thin Liquid: Impaired Presentation: Straw Pharyngeal  Phase Impairments: Cough - Immediate    Nectar Thick Nectar Thick Liquid: (difficult to assess given cough throughout evaluation, even in the absence of po trials.)   Honey Thick Honey Thick Liquid: Not tested   Puree Puree: Within functional limits Presentation: Spoon   Solid     Solid: Not tested     [name] B. Quentin Ore [facility], [place] Speech Language Pathologist [phone]  [name] 05/16/2019,12:21 PM

## 2019-05-16 NOTE — ED Notes (Signed)
Urine and culture sent to lab  

## 2019-05-17 ENCOUNTER — Inpatient Hospital Stay (HOSPITAL_COMMUNITY): Payer: Medicare Other

## 2019-05-17 LAB — CBC
HCT: 34.8 % — ABNORMAL LOW (ref 39.0–52.0)
Hemoglobin: 10 g/dL — ABNORMAL LOW (ref 13.0–17.0)
MCH: 22.5 pg — ABNORMAL LOW (ref 26.0–34.0)
MCHC: 28.7 g/dL — ABNORMAL LOW (ref 30.0–36.0)
MCV: 78.4 fL — ABNORMAL LOW (ref 80.0–100.0)
Platelets: 217 10*3/uL (ref 150–400)
RBC: 4.44 MIL/uL (ref 4.22–5.81)
RDW: 17.2 % — ABNORMAL HIGH (ref 11.5–15.5)
WBC: 7 10*3/uL (ref 4.0–10.5)
nRBC: 0 % (ref 0.0–0.2)

## 2019-05-17 LAB — GLUCOSE, CAPILLARY
Glucose-Capillary: 103 mg/dL — ABNORMAL HIGH (ref 70–99)
Glucose-Capillary: 126 mg/dL — ABNORMAL HIGH (ref 70–99)
Glucose-Capillary: 147 mg/dL — ABNORMAL HIGH (ref 70–99)
Glucose-Capillary: 198 mg/dL — ABNORMAL HIGH (ref 70–99)
Glucose-Capillary: 68 mg/dL — ABNORMAL LOW (ref 70–99)
Glucose-Capillary: 69 mg/dL — ABNORMAL LOW (ref 70–99)
Glucose-Capillary: 74 mg/dL (ref 70–99)

## 2019-05-17 LAB — BASIC METABOLIC PANEL
Anion gap: 10 (ref 5–15)
BUN: 23 mg/dL (ref 8–23)
CO2: 21 mmol/L — ABNORMAL LOW (ref 22–32)
Calcium: 8.2 mg/dL — ABNORMAL LOW (ref 8.9–10.3)
Chloride: 107 mmol/L (ref 98–111)
Creatinine, Ser: 1.45 mg/dL — ABNORMAL HIGH (ref 0.61–1.24)
GFR calc Af Amer: 55 mL/min — ABNORMAL LOW (ref >60)
GFR calc non Af Amer: 48 mL/min — ABNORMAL LOW (ref >60)
Glucose, Bld: 131 mg/dL — ABNORMAL HIGH (ref 70–99)
Potassium: 3.5 mmol/L (ref 3.5–5.1)
Sodium: 138 mmol/L (ref 135–145)

## 2019-05-17 LAB — ACID FAST SMEAR (AFB, MYCOBACTERIA): Acid Fast Smear: POSITIVE — AB

## 2019-05-17 LAB — PROCALCITONIN: Procalcitonin: 0.76 ng/mL

## 2019-05-17 LAB — LACTIC ACID, PLASMA: Lactic Acid, Venous: 1.3 mmol/L (ref 0.5–1.9)

## 2019-05-17 MED ORDER — DEXTROSE 50 % IV SOLN
INTRAVENOUS | Status: AC
  Filled 2019-05-17: qty 50

## 2019-05-17 MED ORDER — SODIUM CHLORIDE 0.9 % IV SOLN
INTRAVENOUS | Status: DC
  Administered 2019-05-17 – 2019-05-18 (×4): via INTRAVENOUS

## 2019-05-17 MED ORDER — DEXTROSE 50 % IV SOLN
12.5000 g | INTRAVENOUS | Status: AC
  Administered 2019-05-17: 12.5 g via INTRAVENOUS

## 2019-05-17 NOTE — Evaluation (Signed)
Occupational Therapy Evaluation Patient Details Name: Clarence Maldonado MRN: [id number] DOB: [birth year] Today's Date: 05/17/2019    History of Present Illness Clarence Maldonado is a 73 y.o. male with medical history significant of cardiomegaly, stroke, COPD,   CKD stage III, tobacco abuse, DM 2, HTN who presented to the emergency room on 8/10 with progressive weakness over the last 3 months to the point where he was falling over. He has lost approximately 43 pounds in the past 2 months. CT scan of chest noted multiple cavitary lesions of unclear etiology with a differential diagnosis of malignancy versus TB versus atypical infection.   Clinical Impression   Pt was admitted for the above.  Unsure of PLOF. Pt lived alone but reported that a friend helped with ADLs.  He did not specify how much assistance he needed.  Pt demonstrates decreased safety awareness/awareness of deficits and decreased memory.  He has poor balance and decreased activity tolerance. Will follow in acute setting with min guard/min A level goals.  Recommend SNF for rehab    Follow Up Recommendations  SNF    Equipment Recommendations  3 in 1 bedside commode    Recommendations for Other Services       Precautions / Restrictions Precautions Precautions: Fall Precaution Comments: reports 4 falls recently, and fell in hospital also Restrictions Weight Bearing Restrictions: No      Mobility Bed Mobility           Sit to supine: Min assist   General bed mobility comments: for legs. Pt able to bridge and roll to adjust pad  Transfers       Sit to Stand: Min assist Stand pivot transfers: Min assist            Balance     Sitting balance-Leahy Scale: Fair       Standing balance-Leahy Scale: Poor                             ADL either performed or assessed with clinical judgement   ADL Overall ADL's : Needs assistance/impaired Eating/Feeding: Supervision/ safety(modified diet)    Grooming: Set up   Upper Body Bathing: Minimal assistance(lines)   Lower Body Bathing: Maximal assistance   Upper Body Dressing : Moderate assistance   Lower Body Dressing: Maximal assistance   Toilet Transfer: Minimal assistance   Toileting- Clothing Manipulation and Hygiene: Total assistance         General ADL Comments: pt fatiques easily.  Pt was on commode when OT arrived; assisted with hygiene.      Vision         Perception     Praxis      Pertinent Vitals/Pain Pain Assessment: No/denies pain     Hand Dominance     Extremity/Trunk Assessment Upper Extremity Assessment Upper Extremity Assessment: Generalized weakness           Communication Communication Communication: HOH(staff wearing N 95 masks)   Cognition Arousal/Alertness: Awake/alert Behavior During Therapy: WFL for tasks assessed/performed                           Following Commands: Follows one step commands consistently     Problem Solving: Requires verbal cues General Comments: decreased safety awareness and decreased awareness of deficits. Reoriented to call bell   General Comments       Exercises     Shoulder Instructions  Home Living Family/patient expects to be discharged to:: Unsure Living Arrangements: Alone                                      Prior Functioning/Environment          Comments: pt states he was not independent with adls, "Clarence Maldonado" helped him.  Did not clarify what he needed help with        OT Problem List: Decreased strength;Decreased activity tolerance;Impaired balance (sitting and/or standing);Decreased cognition;Decreased safety awareness;Decreased knowledge of use of DME or AE      OT Treatment/Interventions: Self-care/ADL training;DME and/or AE instruction;Therapeutic activities;Patient/family education;Balance training    OT Goals(Current goals can be found in the care plan section) Acute Rehab OT  Goals Patient Stated Goal: none stated OT Goal Formulation: With patient Time For Goal Achievement: 05/31/19 Potential to Achieve Goals: Good ADL Goals Pt Will Perform Grooming: with min guard assist;standing Pt Will Transfer to Toilet: with min guard assist;bedside commode;ambulating Pt Will Perform Toileting - Clothing Manipulation and hygiene: with min guard assist;sit to/from stand Additional ADL Goal #1: pt will perform UB adls with set up and LB adls with min A using AE as needed Additional ADL Goal #2: pt will not need more than 1 safety cue per activity during bathing, dressing or toileting  OT Frequency: Min 2X/week   Barriers to D/C:            Co-evaluation              AM-PAC OT "6 Clicks" Daily Activity     Outcome Measure Help from another person eating meals?: A Little Help from another person taking care of personal grooming?: A Little Help from another person toileting, which includes using toliet, bedpan, or urinal?: Total Help from another person bathing (including washing, rinsing, drying)?: A Lot Help from another person to put on and taking off regular upper body clothing?: A Lot Help from another person to put on and taking off regular lower body clothing?: A Lot 6 Click Score: 13   End of Session    Activity Tolerance: Patient limited by fatigue Patient left: in bed;with call bell/phone within reach;with bed alarm set;with nursing/sitter in room  OT Visit Diagnosis: Unsteadiness on feet (R26.81)                Time: 5638-9373 OT Time Calculation (min): 30 min Charges:  OT General Charges $OT Visit: 1 Visit OT Evaluation $OT Eval Low Complexity: 1 Low OT Treatments $Self Care/Home Management : 8-22 mins  Clarence Maldonado, OTR/L Acute Rehabilitation Services [phone] [facility] pager [phone] office 05/17/2019  Clarence Maldonado 05/17/2019, 12:13 PM

## 2019-05-17 NOTE — Progress Notes (Signed)
PROGRESS NOTE  Clarence Maldonado JGG:[id number] DOB: [birth year] DOA: 05/15/2019 PCP: [name], DO  HPI/Recap of past 24 hours: Clarence Maldonado is a 73 y.o. male with medical history significant of cardiomegaly, stroke, COPD,   CKD stage III, tobacco abuse, DM 2, HTN who presented to the emergency room on 8/10 with progressive weakness over the last 3 months to the point where he was falling over.  Called EMS on night of 8/10 after falling again.  No fevers or headaches.  Does report decreased appetite and has lost approximately 43 pounds in the past 2 months..  Denied any history of TB or exposure to TB.  In the emergency room, COVID test negative.  CT scan of chest noted multiple cavitary lesions of unclear etiology with a differential diagnosis of malignancy versus TB versus atypical infection.  Please admitted to the hospital service and placed on isolation precautions.  AFB sputum was ordered.  QuantiFERON-TB gold also ordered.  On 8/11, patient noted to have increased heart rate and respiratory rate.  Negative lactic acid level.  Seen by speech therapy who found signs of significant dysphagia and patient placed on restricted diet.  Seen by pulmonary who started the patient on IV antibiotics.  Today, patient says he feels a bit better.  Tachypnea and tachycardia mildly improved.  White blood cell count stable.  Lactic acid level continues to improve although procalcitonin level slight increase from previous day  Assessment/Plan: Principal Problem: Cavitary pneumonia in patient with history of weight loss and found to have dysphagia, suspected aspiration pneumonia (HCC) versus TB versus malignancy: Continue airborne precautions.  Follow QuantiFERON gold and AFB sputum.  Procalcitonin level minimally remains elevated.  Still need to fully rule out TB, but high suspicion for aspiration pneumonia.  Pulmonary agrees.  On IV antibiotics, restricted diet.  If these tests are all inconclusive,  bronchoscopy, although could do repeat imaging for conservative testing Active Problems:   Essential hypertension: Following blood pressure, continue home medications  Acute respiratory failure with hypoxia: Secondary to pneumonia.  Oxygen 2 L    CKD (chronic kidney disease) stage 3, GFR 30-59 ml/min (HCC): Improved with hydration.  Much closer to baseline    Hyperlipidemia    Tobacco use disorder: Continues to actively smoke.  Nicotine patch    Controlled type 2 diabetes mellitus with stage 3 chronic kidney disease, without long-term current use of insulin (HCC): Stable.  Following sliding scale.  A1c at 6.3    Hypokalemia: Replacing as needed    Anemia: Hemoccult negative.  Suspect chronic disease  Weight loss: Monitoring for malignancy.  Nutrition following  Code Status: Full code  Family Communication: Left message for family friend  Disposition Plan: Skilled nursing facility, although need to rule out TB first   Consultants:  Pulmonary  Procedures:  None  Antimicrobials:  IV Unasyn 8/11-present  IV vancomycin 8/11 only  DVT prophylaxis: SCDs   Objective: Vitals:   05/17/19 0606 05/17/19 0850  BP: 115/69 138/85  Pulse: (!) 106 (!) 123  Resp: 18 20  Temp: 99.2 F (37.3 C) 99.5 F (37.5 C)  SpO2: 91% 97%    Intake/Output Summary (Last 24 hours) at 05/17/2019 1247 Last data filed at 05/17/2019 0846 Gross per 24 hour  Intake 1000.22 ml  Output 700 ml  Net 300.22 ml   Filed Weights   05/15/19 1550 05/16/19 0629  Weight: 54.4 kg 54 kg   Body mass index is 19.21 kg/m.  Exam:   General:  Alert and oriented x3, no acute distress  HEENT: Normocephalic and atraumatic, mucous membranes are slightly dry  Neck: Supple, no JVD  Cardiovascular: Regular rhythm, borderline tachycardia  Respiratory: Much upper airway noise, some rhonchi.  Less wheezing  Abdomen: Soft, nontender, nondistended, hypoactive bowel sounds  Musculoskeletal: No clubbing or  cyanosis, trace pitting edema  Skin: No skin breaks, tears or lesions  Neuro: No focal deficits  Psychiatry: Appropriate, no evidence of psychoses   Data Reviewed: CBC: Recent Labs  Lab 05/15/19 1627 05/16/19 0426 05/17/19 0441  WBC 6.3 7.8 7.0  NEUTROABS 5.4  --   --   HGB 9.4* 9.2* 10.0*  HCT 31.8* 30.9* 34.8*  MCV 76.6* 77.3* 78.4*  PLT 240 215 217   Basic Metabolic Panel: Recent Labs  Lab 05/15/19 1627 05/16/19 0107 05/16/19 0426 05/17/19 0441  NA 132*  --  132* 138  K 2.6*  --  3.4* 3.5  CL 98  --  100 107  CO2 23  --  24 21*  GLUCOSE 135*  --  104* 131*  BUN 27*  --  23 23  CREATININE 1.55*  --  1.34* 1.45*  CALCIUM 8.0*  --  7.7* 8.2*  MG 1.9  --  2.0  --   PHOS  --  2.4* 2.3*  --    GFR: Estimated Creatinine Clearance: 35.2 mL/min (A) (by C-G formula based on SCr of 1.45 mg/dL (H)). Liver Function Tests: Recent Labs  Lab 05/15/19 1627 05/16/19 0426  AST 87* 44*  ALT 33 26  ALKPHOS 123 102  BILITOT 0.9 0.8  PROT 6.1* 5.6*  ALBUMIN 2.1* 1.9*   No results for input(s): LIPASE, AMYLASE in the last 168 hours. No results for input(s): AMMONIA in the last 168 hours. Coagulation Profile: No results for input(s): INR, PROTIME in the last 168 hours. Cardiac Enzymes: Recent Labs  Lab 05/16/19 0107  CKTOTAL 45*   BNP (last 3 results) No results for input(s): PROBNP in the last 8760 hours. HbA1C: Recent Labs    05/16/19 0426  HGBA1C 6.3*   CBG: Recent Labs  Lab 05/16/19 2040 05/17/19 0009 05/17/19 0347 05/17/19 0409 05/17/19 0843  GLUCAP 86 74 68* 103* 69*   Lipid Profile: No results for input(s): CHOL, HDL, LDLCALC, TRIG, CHOLHDL, LDLDIRECT in the last 72 hours. Thyroid Function Tests: Recent Labs    05/16/19 0426  TSH 0.253*   Anemia Panel: Recent Labs    05/16/19 0107  VITAMINB12 483  FOLATE 5.9*  FERRITIN 1,465*  TIBC 126*  IRON 12*  RETICCTPCT 1.1   Urine analysis:    Component Value Date/Time   COLORURINE YELLOW  05/16/2019 0216   APPEARANCEUR CLEAR 05/16/2019 0216   LABSPEC 1.032 (H) 05/16/2019 0216   PHURINE 5.0 05/16/2019 0216   GLUCOSEU NEGATIVE 05/16/2019 0216   HGBUR MODERATE (A) 05/16/2019 0216   BILIRUBINUR NEGATIVE 05/16/2019 0216   KETONESUR NEGATIVE 05/16/2019 0216   PROTEINUR NEGATIVE 05/16/2019 0216   NITRITE NEGATIVE 05/16/2019 0216   LEUKOCYTESUR NEGATIVE 05/16/2019 0216   Sepsis Labs: @LABRCNTIP (procalcitonin:4,lacticidven:4)  ) Recent Results (from the past 240 hour(s))  SARS Coronavirus 2 [facility](Hospital order, Performed in [facility] hospital lab) Nasopharyngeal Nasopharyngeal Swab     Status: None   Collection Time: 05/15/19  5:03 PM   Specimen: Nasopharyngeal Swab  Result Value Ref Range Status   SARS Coronavirus 2 NEGATIVE NEGATIVE Final    Comment: (NOTE) If result is NEGATIVE SARS-CoV-2 target nucleic acids are NOT DETECTED. The SARS-CoV-2 RNA  is generally detectable in upper and lower  respiratory specimens during the acute phase of infection. The lowest  concentration of SARS-CoV-2 viral copies this assay can detect is 250  copies / mL. A negative result does not preclude SARS-CoV-2 infection  and should not be used as the sole basis for treatment or other  patient management decisions.  A negative result may occur with  improper specimen collection / handling, submission of specimen other  than nasopharyngeal swab, presence of viral mutation(s) within the  areas targeted by this assay, and inadequate number of viral copies  (<250 copies / mL). A negative result must be combined with clinical  observations, patient history, and epidemiological information. If result is POSITIVE SARS-CoV-2 target nucleic acids are DETECTED. The SARS-CoV-2 RNA is generally detectable in upper and lower  respiratory specimens dur ing the acute phase of infection.  Positive  results are indicative of active infection with SARS-CoV-2.  Clinical  correlation with patient history and  other diagnostic information is  necessary to determine patient infection status.  Positive results do  not rule out bacterial infection or co-infection with other viruses. If result is PRESUMPTIVE POSTIVE SARS-CoV-2 nucleic acids MAY BE PRESENT.   A presumptive positive result was obtained on the submitted specimen  and confirmed on repeat testing.  While 2019 novel coronavirus  (SARS-CoV-2) nucleic acids may be present in the submitted sample  additional confirmatory testing may be necessary for epidemiological  and / or clinical management purposes  to differentiate between  SARS-CoV-2 and other Sarbecovirus currently known to infect humans.  If clinically indicated additional testing with an alternate test  methodology [phone]([id number]) is advised. The SARS-CoV-2 RNA is generally  detectable in upper and lower respiratory sp ecimens during the acute  phase of infection. The expected result is Negative. Fact Sheet for Patients:  BoilerBrush.com.cyhttps://www.fda.gov/media/136312/download Fact Sheet for Healthcare Providers: https://pope.com/https://www.fda.gov/media/136313/download This test is not yet approved or cleared by the [place] FDA and has been authorized for detection and/or diagnosis of SARS-CoV-2 by FDA under an Emergency Use Authorization (EUA).  This EUA will remain in effect (meaning this test can be used) for the duration of the COVID-19 declaration under Section 564(b)(1) of the Act, 21 U.S.C. section 360bbb-3(b)(1), unless the authorization is terminated or revoked sooner. Performed at [facility], [address] [place], [place] [address]   Culture, blood (Routine X 2) w Reflex to ID Panel     Status: None (Preliminary result)   Collection Time: 05/16/19  1:07 AM   Specimen: BLOOD  Result Value Ref Range Status   Specimen Description   Final    BLOOD LEFT ANTECUBITAL Performed at [facility], [address] [place], [place] [address]    Special  Requests   Final    BOTTLES DRAWN AEROBIC AND ANAEROBIC Blood Culture adequate volume Performed at [facility], [address] [place], [place] [address]    Culture   Final    NO GROWTH 1 DAY Performed at [facility], [address] [place], [place] [address]    Report Status PENDING  Incomplete  Culture, blood (Routine X 2) w Reflex to ID Panel     Status: None (Preliminary result)   Collection Time: 05/16/19  1:07 AM   Specimen: BLOOD  Result Value Ref Range Status   Specimen Description   Final    BLOOD RIGHT ANTECUBITAL Performed at [facility], [address] [place], [place]  [address]    Special Requests   Final    BOTTLES DRAWN AEROBIC AND ANAEROBIC Blood Culture results may not be optimal due to an excessive volume of blood received in culture bottles Performed at [facility], [address] [place], [place] [address]    Culture   Final    NO GROWTH 1 DAY Performed at [facility], [address] [place], [place] [address]    Report Status PENDING  Incomplete  MRSA PCR Screening     Status: None   Collection Time: 05/16/19 12:07 PM   Specimen: Nasal Mucosa; Nasopharyngeal  Result Value Ref Range Status   MRSA by PCR NEGATIVE NEGATIVE Final    Comment:        The GeneXpert MRSA Assay (FDA approved for NASAL specimens only), is one component of a comprehensive MRSA colonization surveillance program. It is not intended to diagnose MRSA infection nor to guide or monitor treatment for MRSA infections. Performed at [facility], [address] [place], [place] [address]   Expectorated sputum assessment w rflx to resp cult     Status: None   Collection Time: 05/16/19 12:46 PM   Specimen: SPU  Result Value Ref Range Status   Specimen Description SPUTUM  Final   Special Requests NONE  Final   Sputum evaluation   Final    THIS SPECIMEN IS ACCEPTABLE FOR SPUTUM CULTURE  Performed at [facility], [address] [place], [place] [address]    Report Status 05/16/2019 FINAL  Final  Culture, respiratory     Status: None (Preliminary result)   Collection Time: 05/16/19 12:46 PM   Specimen: SPU  Result Value Ref Range Status   Specimen Description   Final    SPUTUM Performed at [facility], [address] [place], [place] [address]    Special Requests   Final    NONE Reflexed from [id number] Performed at [facility], [address] [place], [place] [address]    Gram Stain   Final    MODERATE WBC PRESENT,BOTH PMN AND MONONUCLEAR MODERATE GRAM POSITIVE COCCI FEW GRAM VARIABLE ROD    Culture   Final    CULTURE REINCUBATED FOR BETTER GROWTH Performed at [facility], [address] [place], [place] [address]    Report Status PENDING  Incomplete      Studies: Ct Head Wo Contrast  Result Date: 05/17/2019 CLINICAL DATA:  Head trauma with ataxia EXAM: CT HEAD WITHOUT CONTRAST TECHNIQUE: Contiguous axial images were obtained from the base of the skull through the vertex without intravenous contrast. COMPARISON:  08/27/2008 head CT FINDINGS: Brain: No evidence of acute infarction, hemorrhage, hydrocephalus, extra-axial collection or mass lesion/mass effect. Mild motion artifact of the ventral brain causing duplicated appearance of the falx. Generalized atrophy, central predominant, progressed from 2009. Chronic small vessel ischemia that is confluent in the deep white matter-progressive. Remote lacunar infarct in the left thalamus. Vascular: Atherosclerotic calcification. The calcification has occurred at the right MCA and basilar since prior. Skull: Negative for fracture Sinuses/Orbits: No evidence of injury IMPRESSION: 1. No evidence of intracranial injury. 2. Atrophy and chronic small vessel ischemia with progression from 2009. 3. Mild motion artifact. Electronically Signed   By: Marnee SpringJonathon  Watts M.D.    On: 05/17/2019 04:55    Scheduled Meds: . buPROPion  150 mg Oral Daily  . feeding supplement (ENSURE ENLIVE)  237 mL Oral BID BM  . insulin  aspart  0-9 Units Subcutaneous Q4H  . mouth rinse  15 mL Mouth Rinse BID  . mometasone-formoterol  2 puff Inhalation BID  . multivitamin with minerals  1 tablet Oral Daily  . pravastatin  20 mg Oral q1800  . thiamine  100 mg Oral Daily    Continuous Infusions: . sodium chloride 100 mL/hr at 05/17/19 1135  . ampicillin-sulbactam (UNASYN) IV 1.5 g (05/17/19 0909)     LOS: 2 days     Annita Brod, MD [facility]  To reach me or the doctor on call, go to: [email or web address] Password [facility]  05/17/2019, 12:47 PM

## 2019-05-17 NOTE — Progress Notes (Signed)
Tech was alerted by bed alarm and Patient was found on the ground in the bathroom at [place] by Tech. The tech notified me as soon as she found patient. Patient Alert and oriented times four at this time and not complaining of any pain. Patient stated his legs just gave out in the bathroom.Patient reported he might had hit his head. Red mark on head and bruising on both knees. Patient insisted and forced him self on toilet because he needed to have a bowel movement. Patients short of breath VS 158/70 pulse 130 Respiration rate 30 O2 89. On call provider K.Kirby notifed at 0330 about fall and vital signs. Stat Ct of the head was ordered. Patient was placed on 4 liters of oxygen, we got An EKG, and Blood glucose. After being taken to CT, repositioned back in bed, and given emotional support patient was still alert and oriented times four and not complaining of any pain. 0430 Vital signs 114/93 Hr 122 Respirations 18 and O2 99 on 5 liters of oxygen.  1610 Patients Vital signs BP 115/69 Hr 106 Respirations 18 O2 91 on 4 liters of oxygen. Patient still alert and oriented with no complaints. Tele sitter placed at bedside.

## 2019-05-17 NOTE — Progress Notes (Signed)
   05/17/19 0506  What Happened  Was fall witnessed? No  Was patient injured? Unsure  Patient found in bathroom;on floor  Found by Staff-comment (NT - Arlene)  Stated prior activity bathroom-unassisted  Follow Up  MD notified Tylene Fantasia, NP  Time MD notified 71  Family notified No - patient refusal  Additional tests Yes-comment (CT Head)  Adult Fall Risk Assessment  Risk Factor Category (scoring not indicated) History of more than one fall within 6 months before admission (document High fall risk)  Patient Fall Risk Level High fall risk  Adult Fall Risk Interventions  Required Bundle Interventions *See Row Information* High fall risk - low, moderate, and high requirements implemented  Additional Interventions Camera surveillance (with patient/family notification & education);Use of appropriate toileting equipment (bedpan, BSC, etc.)  Screening for Fall Injury Risk (To be completed on HIGH fall risk patients) - Assessing Need for Low Bed  Risk For Fall Injury- Low Bed Criteria Admitted as a result of a fall  Will Implement Low Bed and Floor Mats No - Criteria no longer met for low bed  Pain Assessment  Pain Scale 0-10  Pain Score 0  Neurological  Neuro (WDL) WDL  Musculoskeletal  Musculoskeletal (WDL) X  Assistive Device [facility]

## 2019-05-17 NOTE — Progress Notes (Signed)
  Speech Language Pathology Treatment: Dysphagia  Patient Details Name: KORY RAINS MRN: [id number] DOB: 07-16-1946 Today's Date: 05/17/2019 Time: 2355-7322 SLP Time Calculation (min) (ACUTE ONLY): 40 min  Assessment / Plan / Recommendation Clinical Impression  Pt sitting upright in bed, consuming thin water.   NO cough noted until pt started to consume intake - contributing to suspicion of aspiration.  Pt clinically presents with increased difficulty, indication of worsening airway protection with thin vs nectar characterized by immediate moderately strong cough post-swallow.    Pt clinically presents with increased difficulty, indication of worsening airway protection with thin vs nectar characterized by immediate moderately strong cough post-swallow. Various postures tested to determine clinically if improves airway protection with po intake.  Chin tuck - head turn left tested without improved airway protection with any consistency - mostly thin. Pt is overtly coughing with intake of thin liquids- he reports dysphagia x 2-3 months starting with pain on lower jaw on right spreading to bilateral pharynx.  Pt isolates sensation of "locking up" pointing to his vallecular region.  Stating at times he has to cough up food/pills - that are accompanied by secretions.   He states this all "started" with a virus.  He states when he consumes his mouth numbing medication prior to po, he tolerates eating better.  SLP encouraged pt to maintain strength of cough and "hock" for airway protection. Pt needed moderate cues to demonstrate behaviors that improve airway protection - most importantly consuming small single boluses and ceasing intake if pt is coughing or dyspneic.   SLP educated pt to concerns for dysphagia and aspiration and indication for mitigation strategies.  Given pt's neuro hx - CVA 2009, COPD - suspect he may have multifactorial dysphagia (pharyngeal and esophageal).  Advised him to need for  instrumental swallow evaluation when airborne precautions can be dc'd.      HPI HPI: 73 year old male admitted 05/15/2019 with progressive weakness and cough, failure to thrive. Pt lives alone. PMH: cardiomegaly, CVA (2009), COPD, HTN, DM, obesity, CKD III, tobacco abuse. Lung sounds: some scattered rhonchi. Course rhonchus cough.  CXR = bilateral infiltrates. Chest CT = bilateral diffuse nodular infiltrates with cavitation and a large right upper lobe cystic lesion      SLP Plan  MBS(MBS when pt able to participate - off airborne precautions)       Recommendations  Diet recommendations: Dysphagia 1 (puree);Nectar-thick liquid Liquids provided via: Straw Medication Administration: Whole meds with liquid Supervision: Patient able to self feed Compensations: Minimize environmental distractions;Slow rate;Small sips/bites(instructed pt to cough/hock to clear as base of tongue strength can help clear vallecular region as needed)                Oral Care Recommendations: Oral care BID Follow up Recommendations: Other (comment)(TBD) SLP Visit Diagnosis: Dysphagia, unspecified (R13.10) Plan: MBS(MBS when pt able to participate - off airborne precautions)       [place], [place] [facility] SLP Acute Rehab Services Pager [phone] Office [phone]  [name] 05/17/2019, 10:35 AM

## 2019-05-17 NOTE — Progress Notes (Signed)
Hypoglycemic Event  CBG: 0347 68 Treatment: D50 25 mL (12.5 gm)  Symptoms: Shaky  Follow-up CBG: [id number] CBG Result:103  Possible Reasons for Event: Inadequate meal intake  Comments/MD notified:K .Claiborne Billings

## 2019-05-17 NOTE — Progress Notes (Signed)
NAME:  Clarence Maldonado, MRN:  [id number], DOB:  [birth year], LOS: 2 ADMISSION DATE:  05/15/2019, CONSULTATION DATE:  8/11 REFERRING MD:  Roel Cluck, CHIEF COMPLAINT:  Abnormal CT chest    Brief History   This is a 73 year old male who presented 8/10 w/ 2-3 mo hx of progressive weakness and cough. CT imaging showed diffuse cavitary nodular lung lesions. PCCM asked to eval.   History of present illness   This is a 73 year old male w/ hx as mentioned below. Lives at home alone. Presented to the [facility] ED on 8/10 w/ cc: weakness, cough and shortness of breath w/ sig decreased activity tolerance and falling at home (presented after friend called EMS). This had been slowly progressing over a time frame of 2-3 months.  -no fever no chills, can't say about wt loss (but compared w/ picture on chart appears sig), no dental pain or problems, no  hemoptysis, not able to tell when shortness of breath first noted, and keeps repeating "I don't know" when questioning requires more than 2-3 words.  No sick exposures, no  recent illness, no travel, no incarceration, no Tb exposure, worked in [employer] prior to retiring. He's not able to tell me about possible environmental exposures or his home in general .  CT chest obtained showed diffuse widespread cavitary lung nodules w/ large cystic lesion in the RUL. PCCM asked to evaluate.   Past Medical History  Stroke (2009), COPD, HTN, DM, obesity, CKD stage III, tobacco abuse   Significant Hospital Events   8/10 admitted  Consults:  PCCM consulted 8/11  Procedures:    Significant Diagnostic Tests:  CT chest 8/10: Widespread nodular and cavitary lesions throughout both lungs with a larger air-filled cystic lesion in the right upper lobe. Overall appearance is nonspecific and differential could include atypical infection such as tuberculosis which requires exclusion versus other considerations including cavitating malignancy such as diffuse squamous cell or septic  pulmonary emboli.   Micro Data:  AFB smear 8/11>>> afb 811>>> COVID-19: neg QF gold 8/11>>> BCX2 8/11>>> resp culture 8/11>>> afb smear 8/11>>> afb 8/11>>> afb smear 8/12>>> afb 8/12>>> Antimicrobials:  unasyn 8/11>>>  Interim history/subjective:  Feels better  Objective   Blood pressure 138/85, pulse (Abnormal) 123, temperature 99.5 F (37.5 C), temperature source Oral, resp. rate 20, height _0  (1.676 m), weight 54 kg, SpO2 97 %.        Intake/Output Summary (Last 24 hours) at 05/17/2019 1024 Last data filed at 05/16/2019 1828 Gross per 24 hour  Intake 760.22 ml  Output 700 ml  Net 60.22 ml   Filed Weights   05/15/19 1550 05/16/19 0629  Weight: 54.4 kg 54 kg    Examination: General much more awake. No distress. A little impulsive HENT NCAT no JVD Pulm still w/ course rhonchi t/o very course cough Card rrr abd not tender  Ext warm and dry  gu voids Neuro intact   Resolved Hospital Problem list     Assessment & Plan:   Diffuse cavitary lung nodules  R/o cavitary PNA Failure to thrive Acute metabolic encephalopathy DM COPD CKD stage III Anemia  Fluid and electrolyte imbalance    Diffuse cavitary lung nodules w/ failure to thrive.  Ddx: include infectious such as chronic aspiration (does endorse occasional ETOH intake, but not able to quantify), septic emboli (currently blood cultures pending), atypical infection such as MAC or Tb or non-infectious concern would be malignancy (he is a smoker) -he looks much better. Now  much more interactive. He also now tells [place] that he has had coughing issues and swallowing issues for the last 2 months. His QF-gold is still pending, all of his AFBs are still pending. Given his response to abx and improvement clinically I am less inclined to think that this is an atypical process and more inclined to think that this is the complication of aspiration. Not sure if it is esophageal or dysphagia related. One challenge here  is that he cannot have study down in radiology until he is cleared from respiratory isolation  Plan Send third AFB F/u QF-G F/u pending respiratory culture Cont unasyn Am cxr  Needs barium swallow; may also need esophagram  Will tentatively put him on schedule for bronch Friday but seems less likely he will need this.     Best practice:  Diet: reg Pain/Anxiety/Delirium protocol (if indicated): na VAP protocol (if indicated): na DVT prophylaxis: scd GI prophylaxis: na  Glucose control: ssi Mobility: increase as able Code Status: full code  Family Communication: pending  Disposition:  Currently evaluating cavitary changes     Erick Colace ACNP-BC [place] Pager # [phone] OR # [phone] if no answer

## 2019-05-18 LAB — GLUCOSE, CAPILLARY
Glucose-Capillary: 123 mg/dL — ABNORMAL HIGH (ref 70–99)
Glucose-Capillary: 124 mg/dL — ABNORMAL HIGH (ref 70–99)
Glucose-Capillary: 154 mg/dL — ABNORMAL HIGH (ref 70–99)
Glucose-Capillary: 75 mg/dL (ref 70–99)
Glucose-Capillary: 76 mg/dL (ref 70–99)
Glucose-Capillary: 84 mg/dL (ref 70–99)
Glucose-Capillary: 89 mg/dL (ref 70–99)

## 2019-05-18 LAB — CULTURE, RESPIRATORY W GRAM STAIN: Culture: NORMAL

## 2019-05-18 LAB — QUANTIFERON-TB GOLD PLUS (RQFGPL)
QuantiFERON Mitogen Value: 1.06 IU/mL
QuantiFERON Nil Value: 0.66 IU/mL
QuantiFERON TB1 Ag Value: 0.92 IU/mL
QuantiFERON TB2 Ag Value: 1.05 IU/mL

## 2019-05-18 LAB — BASIC METABOLIC PANEL
Anion gap: 7 (ref 5–15)
BUN: 29 mg/dL — ABNORMAL HIGH (ref 8–23)
CO2: 24 mmol/L (ref 22–32)
Calcium: 8.4 mg/dL — ABNORMAL LOW (ref 8.9–10.3)
Chloride: 111 mmol/L (ref 98–111)
Creatinine, Ser: 1.45 mg/dL — ABNORMAL HIGH (ref 0.61–1.24)
GFR calc Af Amer: 55 mL/min — ABNORMAL LOW (ref >60)
GFR calc non Af Amer: 48 mL/min — ABNORMAL LOW (ref >60)
Glucose, Bld: 107 mg/dL — ABNORMAL HIGH (ref 70–99)
Potassium: 3.2 mmol/L — ABNORMAL LOW (ref 3.5–5.1)
Sodium: 142 mmol/L (ref 135–145)

## 2019-05-18 LAB — PROCALCITONIN: Procalcitonin: 2.29 ng/mL

## 2019-05-18 LAB — CBC
HCT: 33.5 % — ABNORMAL LOW (ref 39.0–52.0)
Hemoglobin: 9.4 g/dL — ABNORMAL LOW (ref 13.0–17.0)
MCH: 22.4 pg — ABNORMAL LOW (ref 26.0–34.0)
MCHC: 28.1 g/dL — ABNORMAL LOW (ref 30.0–36.0)
MCV: 79.8 fL — ABNORMAL LOW (ref 80.0–100.0)
Platelets: 236 10*3/uL (ref 150–400)
RBC: 4.2 MIL/uL — ABNORMAL LOW (ref 4.22–5.81)
RDW: 17.2 % — ABNORMAL HIGH (ref 11.5–15.5)
WBC: 5.7 10*3/uL (ref 4.0–10.5)
nRBC: 0 % (ref 0.0–0.2)

## 2019-05-18 LAB — QUANTIFERON-TB GOLD PLUS: QuantiFERON-TB Gold Plus: POSITIVE — AB

## 2019-05-18 MED ORDER — DEXTROSE 50 % IV SOLN
1.0000 | INTRAVENOUS | Status: DC | PRN
  Administered 2019-05-18 – 2019-05-19 (×2): 50 mL via INTRAVENOUS
  Filled 2019-05-18: qty 50

## 2019-05-18 MED ORDER — POTASSIUM CHLORIDE CRYS ER 20 MEQ PO TBCR
40.0000 meq | EXTENDED_RELEASE_TABLET | Freq: Once | ORAL | Status: AC
  Administered 2019-05-18: 11:00:00 40 meq via ORAL
  Filled 2019-05-18: qty 2

## 2019-05-18 NOTE — Progress Notes (Signed)
PROGRESS NOTE  Clarence Maldonado ZOX:[id number] DOB: Feb 28, 1946 DOA: 05/15/2019 PCP: Gayland Curry, DO  HPI/Recap of past 24 hours: Clarence Maldonado a 73 y.o.malewith medical history significant ofcardiomegaly, stroke, COPD, CKD stage III, tobacco abuse, DM 2, HTN who presented to the emergency room on 8/10 with progressive weakness over the last 3 months to the point where he was falling over.  Called EMS on night of 8/10 after falling again.  No fevers or headaches.  Does report decreased appetite and has lost approximately 43 pounds in the past 2 months..  Denied any history of TB or exposure to TB.  In the emergency room, COVID test negative.  CT scan of chest noted multiple cavitary lesions of unclear etiology with a differential diagnosis of malignancy versus TB versus atypical infection.  Please admitted to the hospital service and placed on isolation precautions.  AFB sputum was ordered.  QuantiFERON-TB gold also ordered.  On 8/11, patient noted to have increased heart rate and respiratory rate.  Negative lactic acid level.  Seen by speech therapy who found signs of significant dysphagia and patient placed on restricted diet.  Seen by pulmonary who started the patient on IV antibiotics.  05/18/19: Patient was seen and examined at his bedside this morning.  Reports productive cough and denies hemoptysis.  O2 saturation 99% on 1 L.  TB Gold test positive.  Suspicion for TB versus MAC, infectious disease consulted.   Assessment/Plan: Principal Problem:   Aspiration pneumonia (HCC) Active Problems:   Essential hypertension   CKD (chronic kidney disease) stage 3, GFR 30-59 ml/min (HCC)   Hyperlipidemia   Tobacco use disorder   Controlled type 2 diabetes mellitus with stage 3 chronic kidney disease, without long-term current use of insulin (HCC)   Weight loss   Hyperlipidemia associated with type 2 diabetes mellitus ([place])   Cavitary lesion of lung   Hypokalemia   Cavitary  pneumonia   Anemia  Bilateral cavitary pneumonia, TB versus MAC TB Gold plus positive AFB positive x2 Infectious disease consulted and will see in consultation. Procalcitonin 2.29 on 05/18/2019 Personally reviewed CT chest during this admission which shows multiple bilateral cavitary lesions Maintain O2 saturation greater than 92% Currently on Unasyn Continue bronchodilators [facility] PCCM following  Acute hypoxic respiratory failure likely secondary to bilateral cavitary pneumonia, with concern for TB versus MAC Self-reported not on oxygen supplementation at baseline Currently on 4 L with oxygen saturation 99%  Type 2 diabetes, complicated by CKD 3 Hemoglobin A1c 6.3 Continue insulin sliding scale Avoid hypoglycemia  CKD 3 Appears to be close to his baseline with creatinine of 1.45 and GFR of 48 Continue to avoid nephrotoxins Continue to monitor urine output  Intermittent tachycardia Possibly related to his lung physiology Obtain twelve-lead EKG  Tobacco use disorder Nicotine patch as needed  Unintentional weight loss Dietitian following Continue oral supplement  Physical debility PT recommended SNF Fall precautions  Code Status: Full code  Family Communication: Left message for family friend  Disposition Plan: Skilled nursing facility when hemodynamically stable. Consultants:  Pulmonary  Infectious disease  Procedures:  None  Antimicrobials:  IV Unasyn 8/11-present  IV vancomycin 8/11 only  DVT prophylaxis: SCDs    Objective: Vitals:   05/17/19 2113 05/18/19 0016 05/18/19 0547 05/18/19 1014  BP: 128/74 (!) 148/85 (!) 143/89 123/73  Pulse: (!) 102 100 96 (!) 125  Resp: _0 (!) 24  Temp: 98.4 F (36.9 C) 99.8 F (37.7 C) 98.5 F (36.9 C)  TempSrc: Oral Oral Oral   SpO2: 97% 99% 99% 99%  Weight:      Height:        Intake/Output Summary (Last 24 hours) at 05/18/2019 1423 Last data filed at 05/18/2019 1010 Gross per 24 hour   Intake 2743.12 ml  Output 400 ml  Net 2343.12 ml   Filed Weights   05/15/19 1550 05/16/19 0629  Weight: 54.4 kg 54 kg    Exam:  . General: 72 y.o. year-old male well developed well nourished in no acute distress.  Alert and interactive.  Very hard of hearing. . Cardiovascular: Regular rate and rhythm with no rubs or gallops.  No thyromegaly or JVD noted.   Marland Kitchen Respiratory: Diffuse rales at bases.  No wheezes noted.  Poor inspiratory effort.   . Abdomen: Soft nontender nondistended with normal bowel sounds x4 quadrants. . Musculoskeletal: No lower extremity edema. 2/4 pulses in all 4 extremities. Marland Kitchen Psychiatry: Mood is appropriate for condition and setting   Data Reviewed: CBC: Recent Labs  Lab 05/15/19 1627 05/16/19 0426 05/17/19 0441 05/18/19 0353  WBC 6.3 7.8 7.0 5.7  NEUTROABS 5.4  --   --   --   HGB 9.4* 9.2* 10.0* 9.4*  HCT 31.8* 30.9* 34.8* 33.5*  MCV 76.6* 77.3* 78.4* 79.8*  PLT 240 215 217 174   Basic Metabolic Panel: Recent Labs  Lab 05/15/19 1627 05/16/19 0107 05/16/19 0426 05/17/19 0441 05/18/19 0353  NA 132*  --  132* 138 142  K 2.6*  --  3.4* 3.5 3.2*  CL 98  --  100 107 111  CO2 23  --  24 21* 24  GLUCOSE 135*  --  104* 131* 107*  BUN 27*  --  23 23 29*  CREATININE 1.55*  --  1.34* 1.45* 1.45*  CALCIUM 8.0*  --  7.7* 8.2* 8.4*  MG 1.9  --  2.0  --   --   PHOS  --  2.4* 2.3*  --   --    GFR: Estimated Creatinine Clearance: 35.2 mL/min (A) (by C-G formula based on SCr of 1.45 mg/dL (H)). Liver Function Tests: Recent Labs  Lab 05/15/19 1627 05/16/19 0426  AST 87* 44*  ALT 33 26  ALKPHOS 123 102  BILITOT 0.9 0.8  PROT 6.1* 5.6*  ALBUMIN 2.1* 1.9*   No results for input(s): LIPASE, AMYLASE in the last 168 hours. No results for input(s): AMMONIA in the last 168 hours. Coagulation Profile: No results for input(s): INR, PROTIME in the last 168 hours. Cardiac Enzymes: Recent Labs  Lab 05/16/19 0107  CKTOTAL 45*   BNP (last 3 results) No  results for input(s): PROBNP in the last 8760 hours. HbA1C: Recent Labs    05/16/19 0426  HGBA1C 6.3*   CBG: Recent Labs  Lab 05/18/19 0011 05/18/19 0206 05/18/19 0543 05/18/19 0745 05/18/19 1200  GLUCAP 76 124* 84 75 123*   Lipid Profile: No results for input(s): CHOL, HDL, LDLCALC, TRIG, CHOLHDL, LDLDIRECT in the last 72 hours. Thyroid Function Tests: Recent Labs    05/16/19 0426  TSH 0.253*   Anemia Panel: Recent Labs    05/16/19 0107  VITAMINB12 483  FOLATE 5.9*  FERRITIN 1,465*  TIBC 126*  IRON 12*  RETICCTPCT 1.1   Urine analysis:    Component Value Date/Time   COLORURINE YELLOW 05/16/2019 0216   APPEARANCEUR CLEAR 05/16/2019 0216   LABSPEC 1.032 (H) 05/16/2019 0216   PHURINE 5.0 05/16/2019 0216   GLUCOSEU NEGATIVE 05/16/2019 0216  HGBUR MODERATE (A) 05/16/2019 0216   BILIRUBINUR NEGATIVE 05/16/2019 0216   KETONESUR NEGATIVE 05/16/2019 0216   PROTEINUR NEGATIVE 05/16/2019 0216   NITRITE NEGATIVE 05/16/2019 0216   LEUKOCYTESUR NEGATIVE 05/16/2019 0216   Sepsis Labs: _0 (procalcitonin:4,lacticidven:4)  ) Recent Results (from the past 240 hour(s))  SARS Coronavirus 2 [facility] order, Performed in [facility] hospital lab) Nasopharyngeal Nasopharyngeal Swab     Status: None   Collection Time: 05/15/19  5:03 PM   Specimen: Nasopharyngeal Swab  Result Value Ref Range Status   SARS Coronavirus 2 NEGATIVE NEGATIVE Final    Comment: (NOTE) If result is NEGATIVE SARS-CoV-2 target nucleic acids are NOT DETECTED. The SARS-CoV-2 RNA is generally detectable in upper and lower  respiratory specimens during the acute phase of infection. The lowest  concentration of SARS-CoV-2 viral copies this assay can detect is 250  copies / mL. A negative result does not preclude SARS-CoV-2 infection  and should not be used as the sole basis for treatment or other  patient management decisions.  A negative result may occur with  improper specimen collection /  handling, submission of specimen other  than nasopharyngeal swab, presence of viral mutation(s) within the  areas targeted by this assay, and inadequate number of viral copies  (<250 copies / mL). A negative result must be combined with clinical  observations, patient history, and epidemiological information. If result is POSITIVE SARS-CoV-2 target nucleic acids are DETECTED. The SARS-CoV-2 RNA is generally detectable in upper and lower  respiratory specimens dur ing the acute phase of infection.  Positive  results are indicative of active infection with SARS-CoV-2.  Clinical  correlation with patient history and other diagnostic information is  necessary to determine patient infection status.  Positive results do  not rule out bacterial infection or co-infection with other viruses. If result is PRESUMPTIVE POSTIVE SARS-CoV-2 nucleic acids MAY BE PRESENT.   A presumptive positive result was obtained on the submitted specimen  and confirmed on repeat testing.  While 2019 novel coronavirus  (SARS-CoV-2) nucleic acids may be present in the submitted sample  additional confirmatory testing may be necessary for epidemiological  and / or clinical management purposes  to differentiate between  SARS-CoV-2 and other Sarbecovirus currently known to infect humans.  If clinically indicated additional testing with an alternate test  methodology [phone]) is advised. The SARS-CoV-2 RNA is generally  detectable in upper and lower respiratory sp ecimens during the acute  phase of infection. The expected result is Negative. Fact Sheet for Patients:  StrictlyIdeas.no Fact Sheet for Healthcare Providers: BankingDealers.co.za This test is not yet approved or cleared by the [place] FDA and has been authorized for detection and/or diagnosis of SARS-CoV-2 by FDA under an Emergency Use Authorization (EUA).  This EUA will remain in effect (meaning this  test can be used) for the duration of the COVID-19 declaration under Section 564(b)(1) of the Act, 21 U.S.C. section 360bbb-3(b)(1), unless the authorization is terminated or revoked sooner. Performed at [facility], [place] [address], [place], [place] [address]   Culture, blood (Routine X 2) w Reflex to ID Panel     Status: None (Preliminary result)   Collection Time: 05/16/19  1:07 AM   Specimen: BLOOD  Result Value Ref Range Status   Specimen Description   Final    BLOOD LEFT ANTECUBITAL Performed at [place] [address] [place], [place] [address]    Special Requests   Final    BOTTLES DRAWN AEROBIC  AND ANAEROBIC Blood Culture adequate volume Performed at [place] [address] [place], [place] [address]    Culture   Final    NO GROWTH 2 DAYS Performed at [place] [address] [place], [place] [address]    Report Status PENDING  Incomplete  Culture, blood (Routine X 2) w Reflex to ID Panel     Status: None (Preliminary result)   Collection Time: 05/16/19  1:07 AM   Specimen: BLOOD  Result Value Ref Range Status   Specimen Description   Final    BLOOD RIGHT ANTECUBITAL Performed at [place] [address], [place], [place] [address]    Special Requests   Final    BOTTLES DRAWN AEROBIC AND ANAEROBIC Blood Culture results may not be optimal due to an excessive volume of blood received in culture bottles Performed at [place] [address], [place], [place] [address]    Culture   Final    NO GROWTH 2 DAYS Performed at [place] [address], [place], [place] [address]    Report Status PENDING  Incomplete  Acid Fast Smear (AFB)     Status: Abnormal   Collection Time: 05/16/19  2:16 AM   Specimen: Sputum  Result Value Ref Range Status   AFB Specimen Processing Concentration  Final   Acid Fast Smear Positive (A)  Final    Comment:  (NOTE) 2+, 4-36 acid-fast bacilli per 10 fields at 400X magnification, fluorescent smear NOTIFIED KATHLEEN ON 05/17/19 AT 15:17 DS FAXED TO [phone] Performed At: [facility] [place], [place] [id number] Rush Farmer MD [id number]    Source (AFB) SPUTUM  Final    Comment: Performed at [facility], [place] [address], [place], [place] [address]  Acid Fast Smear (AFB)     Status: Abnormal   Collection Time: 05/16/19 12:07 PM   Specimen: Sputum  Result Value Ref Range Status   AFB Specimen Processing Concentration  Final   Acid Fast Smear Positive (A)  Final    Comment: (NOTE) 3+, 4-36 acid-fast bacilli per field at 400X magnification, fluorescent smear NOTIFIED KATHLEEN ON 05/17/19 AT 15:17 DS FAXED TO [phone] Performed At: [facility] [place], [place] [id number] Rush Farmer MD BZ:[id number]    Source (AFB) SPUTUM  Final    Comment: Performed at [facility], [place] [address] [place], [place] [address]  MRSA PCR Screening     Status: None   Collection Time: 05/16/19 12:07 PM   Specimen: Nasal Mucosa; Nasopharyngeal  Result Value Ref Range Status   MRSA by PCR NEGATIVE NEGATIVE Final    Comment:        The GeneXpert MRSA Assay (FDA approved for NASAL specimens only), is one component of a comprehensive MRSA colonization surveillance program. It is not intended to diagnose MRSA infection nor to guide or monitor treatment for MRSA infections. Performed at [facility], [place] [address], [place],  [address]   Expectorated sputum assessment w rflx to resp cult     Status: None   Collection Time: 05/16/19 12:46 PM   Specimen: SPU  Result Value Ref Range Status   Specimen Description SPUTUM  Final   Special Requests NONE  Final   Sputum evaluation   Final    THIS SPECIMEN IS ACCEPTABLE FOR SPUTUM CULTURE Performed at [facility], [place] [address] [place],  [address]  Report Status 05/16/2019 FINAL  Final  Culture, respiratory     Status: None   Collection Time: 05/16/19 12:46 PM   Specimen: SPU  Result Value Ref Range Status   Specimen Description   Final    SPUTUM Performed at [place] [address] [place], [place] [address]    Special Requests   Final    NONE Reflexed from [id number] Performed at [place] [address] [place], [place] [address]    Gram Stain   Final    MODERATE WBC PRESENT,BOTH PMN AND MONONUCLEAR MODERATE GRAM POSITIVE COCCI FEW GRAM VARIABLE ROD    Culture   Final    FEW Consistent with normal respiratory flora. Performed at [facility], [place] [address] [place], [place] [address]    Report Status 05/18/2019 FINAL  Final      Studies: No results found.  Scheduled Meds: . buPROPion  150 mg Oral Daily  . feeding supplement (ENSURE ENLIVE)  237 mL Oral BID BM  . insulin aspart  0-9 Units Subcutaneous Q4H  . mouth rinse  15 mL Mouth Rinse BID  . mometasone-formoterol  2 puff Inhalation BID  . multivitamin with minerals  1 tablet Oral Daily  . pravastatin  20 mg Oral q1800  . thiamine  100 mg Oral Daily    Continuous Infusions: . sodium chloride 100 mL/hr at 05/17/19 1737  . ampicillin-sulbactam (UNASYN) IV 1.5 g (05/18/19 1329)     LOS: 3 days     Kayleen Memos, MD [facility] Pager [phone]  If 7PM-7AM, please contact night-coverage [email or web address] Password [facility] 05/18/2019, 2:23 PM

## 2019-05-18 NOTE — Care Management Important Message (Signed)
Important Message  Patient Details IM Letter given to Kathrin Greathouse SW to present to the Patient Name: Clarence Maldonado MRN: [id number] Date of Birth: 01-Oct-1946   Medicare Important Message Given:  Yes     Kerin Salen 05/18/2019, 10:33 AM

## 2019-05-18 NOTE — Progress Notes (Signed)
NAME:  Clarence Maldonado, MRN:  [id number], DOB:  1946/02/07, LOS: 3 ADMISSION DATE:  05/15/2019, CONSULTATION DATE:  8/11 REFERRING MD:  Roel Cluck, CHIEF COMPLAINT:  Abnormal CT chest    Brief History   This is a 73 year old male who presented 8/10 w/ 2-3 mo hx of progressive weakness and cough. CT imaging showed diffuse cavitary nodular lung lesions. PCCM asked to eval.   History of present illness   This is a 73 year old male w/ hx as mentioned below. Lives at home alone. Presented to the [facility] ED on 8/10 w/ cc: weakness, cough and shortness of breath w/ sig decreased activity tolerance and falling at home (presented after friend called EMS). This had been slowly progressing over a time frame of 2-3 months.  -no fever no chills, can't say about wt loss (but compared w/ picture on chart appears sig), no dental pain or problems, no  hemoptysis, not able to tell when shortness of breath first noted, and keeps repeating "I don't know" when questioning requires more than 2-3 words.  No sick exposures, no  recent illness, no travel, no incarceration, no Tb exposure, worked in [employer] prior to retiring. He's not able to tell me about possible environmental exposures or his home in general .  CT chest obtained showed diffuse widespread cavitary lung nodules w/ large cystic lesion in the RUL. PCCM asked to evaluate.   Past Medical History  Stroke (2009), COPD, HTN, DM, obesity, CKD stage III, tobacco abuse   Significant Hospital Events   8/10 admitted  Consults:  PCCM consulted 8/11 Id consulted 8/13 Procedures:    Significant Diagnostic Tests:  CT chest 8/10: Widespread nodular and cavitary lesions throughout both lungs with a larger air-filled cystic lesion in the right upper lobe. Overall appearance is nonspecific and differential could include atypical infection such as tuberculosis which requires exclusion versus other considerations including cavitating malignancy such as diffuse squamous  cell or septic pulmonary emboli.   Micro Data:  AFB smear 8/11>>>positive  afb 811>>> COVID-19: neg QF gold 8/11>>>positive  BCX2 8/11>>> resp culture 8/11>>> afb smear 8/11>>>positive  afb 8/11>>> afb smear 8/12>>> afb 8/12>>> Antimicrobials:  unasyn 8/11>>>  Interim history/subjective:  Feels better  Objective   Blood pressure (Abnormal) 143/89, pulse 96, temperature 98.5 F (36.9 C), temperature source Oral, resp. rate 18, height _0  (1.676 m), weight 54 kg, SpO2 99 %.        Intake/Output Summary (Last 24 hours) at 05/18/2019 0858 Last data filed at 05/17/2019 1957 Gross per 24 hour  Intake 1317.94 ml  Output 100 ml  Net 1217.94 ml   Filed Weights   05/15/19 1550 05/16/19 0629  Weight: 54.4 kg 54 kg    Examination: General 73 year old chronically ill appearing male. No acute distress HENT NCAT edentulous MMM no JVD pulm decreased bases. Rhonchus cough  Card RRR  abd not tender  gu voids Neuro awake and oriented    Resolved Hospital Problem list     Assessment & Plan:   Diffuse cavitary lung nodules  R/o cavitary PNA Failure to thrive Acute metabolic encephalopathy DM COPD CKD stage III Anemia  Fluid and electrolyte imbalance    Diffuse cavitary lung nodules w/ Positive AFB/ + QF-gold and failure to thrive.  Ddx: include infectious such as MAC vs Tb. Seems more likely MAC BUT has had sig weight loss. Still think that there is a aspiration component to this as clinically he looks better  Plan  ID consulted Await final culture date Cont unasyn for aspiration (rx 7d total) Aspiration precautions Needs barium swallow but his + afb barrier for this currently  Will defer timing of f/u imaging to Dr Elsworth Soho; think we can cancel bronch   Best practice:  Diet: reg Pain/Anxiety/Delirium protocol (if indicated): na VAP protocol (if indicated): na DVT prophylaxis: scd GI prophylaxis: na  Glucose control: ssi Mobility: increase as able Code Status:  full code  Family Communication: pending  Disposition:  Awaiting ID eval     Erick Colace ACNP-BC [place] Pager # [phone] OR # [phone] if no answer

## 2019-05-18 NOTE — Progress Notes (Signed)
SLP Cancellation Note  Patient Details Name: Clarence Maldonado MRN: [id number] DOB: Oct 30, 1945   Cancelled treatment:       Reason Eval/Treat Not Completed: Medical issues which prohibited therapy. Patient continues with airborne precautions which are preventing him from getting MBS. Will continue to follow for readiness for MBS   Sonia Baller, MA, [facility] Pager: [phone]

## 2019-05-18 NOTE — Consult Note (Signed)
[facility]  Total days of antibiotics 3- amp/sub         Reason for Consult: cavitary lesion   Referring Physician: hall  Principal Problem:   Aspiration pneumonia (HCC) Active Problems:   Essential hypertension   CKD (chronic kidney disease) stage 3, GFR 30-59 ml/min (HCC)   Hyperlipidemia   Tobacco use disorder   Controlled type 2 diabetes mellitus with stage 3 chronic kidney disease, without long-term current use of insulin (HCC)   Weight loss   Hyperlipidemia associated with type 2 diabetes mellitus (HCC)   Cavitary lesion of lung   Hypokalemia   Cavitary pneumonia   Anemia    HPI: Clarence Maldonado is a 73 y.o. male  With hx of COD, CKD 3, CVA, who was admitted on 8/10 for failure to thrive, decreased appetite, weight loss of >50 lb over 3 months with c/o worsening shortness of breath, drenching nightsweats, overall fatigue. He denies hemoptysis. On admit, found to be febrile, with abn CXR showing atypical infection, which led to chest CT that showed large cavitary lesion in right upper lobe, signs of smaller cavitary lesions, bronchiectasis throughout. He was started on amp/sub with cultures being collected. His sputum cx were + afb, QTF + but denies any TB exposure. He worked in [employer] and enjoys gardening. No hx of incarceration.   Past Medical History:  Diagnosis Date   Abnormal CT of the chest 2009   nodule 9 x 6 mm RUL   Benign localized hyperplasia of prostate with urinary obstruction and other lower urinary tract symptoms (LUTS)(600.21)    Cardiomegaly    Cerebrovascular accident (stroke) (HCC) 2009   lacunar infarct in the left thalamus   Cholelithiasis    Chronic airway obstruction, not elsewhere classified    Chronic kidney disease, stage III (moderate) (HCC)    Closed femur fracture (HCC) 1960   Illiterate    Obesity, unspecified    Other and unspecified hyperlipidemia    Tobacco use disorder    Type II or  unspecified type diabetes mellitus with renal manifestations, uncontrolled(250.42)    Unspecified essential hypertension     Allergies: No Known Allergies   MEDICATIONS:  buPROPion  150 mg Oral Daily   feeding supplement (ENSURE ENLIVE)  237 mL Oral BID BM   insulin aspart  0-9 Units Subcutaneous Q4H   mouth rinse  15 mL Mouth Rinse BID   mometasone-formoterol  2 puff Inhalation BID   multivitamin with minerals  1 tablet Oral Daily   pravastatin  20 mg Oral q1800   thiamine  100 mg Oral Daily    Social History   Tobacco Use   Smoking status: Current Every Day Smoker    Packs/day: 0.50    Years: 60.00    Pack years: 30.00    Types: Cigarettes   Smokeless tobacco: Never Used   Tobacco comment: Used to be 2 packs a day.  Substance Use Topics   Alcohol use: Yes    Alcohol/week: 1.0 standard drinks    Types: 1 Cans of beer per week   Drug use: No    Family History  Problem Relation Age of Onset   Heart disease Mother     Review of Systems  Constitutional: +  for fever, chills, diaphoresis, activity change, appetite change, fatigue and unexpected weight change.  HENT: + sore throat. Negative for congestion, , rhinorrhea, sneezing, trouble swallowing and sinus pressure.  Eyes: Negative for photophobia and visual  disturbance.  Respiratory: + shortness of breath and cough. But Negative for chest tightness,  wheezing and stridor.  Cardiovascular: Negative for chest pain, palpitations and leg swelling.  Gastrointestinal: Negative for nausea, vomiting, abdominal pain, diarrhea, constipation, blood in stool, abdominal distention and anal bleeding.  Genitourinary: Negative for dysuria, hematuria, flank pain and difficulty urinating.  Musculoskeletal: Negative for myalgias, back pain, joint swelling, arthralgias and gait problem.  Skin: Negative for color change, pallor, rash and wound.  Neurological: Negative for dizziness, tremors, weakness and light-headedness.    Hematological: Negative for adenopathy. Does not bruise/bleed easily.  Psychiatric/Behavioral: Negative for behavioral problems, confusion, sleep disturbance, dysphoric mood, decreased concentration and agitation.     OBJECTIVE: Temp:  [98.4 F (36.9 C)-99.8 F (37.7 C)] 98.4 F (36.9 C) (08/13 1524) Pulse Rate:  [96-125] 109 (08/13 1524) Resp:  [18-24] 22 (08/13 1524) BP: (123-148)/(73-89) 140/83 (08/13 1524) SpO2:  [96 %-100 %] 96 % (08/13 1524) Physical Exam  Constitutional: He is oriented to person, place, and time. He appears well-developed and well-nourished. No distress.  HENT:  Mouth/Throat: Oropharynx is clear and moist. No oropharyngeal exudate.  Cardiovascular: Normal rate, regular rhythm and normal heart sounds. Exam reveals no gallop and no friction rub.  No murmur heard.  Pulmonary/Chest: tachypnea, with access muscle use.  occ rhonchi. He has no wheezes.  Abdominal: Soft. Bowel sounds are normal. He exhibits no distension. There is no tenderness.  Lymphadenopathy:  He has no cervical adenopathy.  Neurological: He is alert and oriented to person, place, and time.  Skin: Skin is warm and dry. No rash noted. No erythema.  Psychiatric: He has a normal mood and affect. His behavior is normal.     LABS: Results for orders placed or performed during the hospital encounter of 05/15/19 (from the past 48 hour(s))  Glucose, capillary     Status: None   Collection Time: 05/16/19  4:45 PM  Result Value Ref Range   Glucose-Capillary 75 70 - 99 mg/dL  Glucose, capillary     Status: None   Collection Time: 05/16/19  8:40 PM  Result Value Ref Range   Glucose-Capillary 86 70 - 99 mg/dL  Glucose, capillary     Status: None   Collection Time: 05/17/19 12:09 AM  Result Value Ref Range   Glucose-Capillary 74 70 - 99 mg/dL  Glucose, capillary     Status: Abnormal   Collection Time: 05/17/19  3:47 AM  Result Value Ref Range   Glucose-Capillary 68 (L) 70 - 99 mg/dL  Glucose,  capillary     Status: Abnormal   Collection Time: 05/17/19  4:09 AM  Result Value Ref Range   Glucose-Capillary 103 (H) 70 - 99 mg/dL  Procalcitonin     Status: None   Collection Time: 05/17/19  4:41 AM  Result Value Ref Range   Procalcitonin 0.76 ng/mL    Comment:        Interpretation: PCT > 0.5 ng/mL and <= 2 ng/mL: Systemic infection (sepsis) is possible, but other conditions are known to elevate PCT as well. (NOTE)       Sepsis PCT Algorithm           Lower Respiratory Tract                                      Infection PCT Algorithm    ----------------------------     ----------------------------  PCT < 0.25 ng/mL                PCT < 0.10 ng/mL         Strongly encourage             Strongly discourage   discontinuation of antibiotics    initiation of antibiotics    ----------------------------     -----------------------------       PCT 0.25 - 0.50 ng/mL            PCT 0.10 - 0.25 ng/mL               OR       >80% decrease in PCT            Discourage initiation of                                            antibiotics      Encourage discontinuation           of antibiotics    ----------------------------     -----------------------------         PCT >= 0.50 ng/mL              PCT 0.26 - 0.50 ng/mL                AND       <80% decrease in PCT             Encourage initiation of                                             antibiotics       Encourage continuation           of antibiotics    ----------------------------     -----------------------------        PCT >= 0.50 ng/mL                  PCT > 0.50 ng/mL               AND         increase in PCT                  Strongly encourage                                      initiation of antibiotics    Strongly encourage escalation           of antibiotics                                     -----------------------------                                           PCT <= 0.25 ng/mL  OR                                        > 80% decrease in PCT                                     Discontinue / Do not initiate                                             antibiotics Performed at [facility], [address] [place], [place] [address]   CBC     Status: Abnormal   Collection Time: 05/17/19  4:41 AM  Result Value Ref Range   WBC 7.0 4.0 - 10.5 K/uL   RBC 4.44 4.22 - 5.81 MIL/uL   Hemoglobin 10.0 (L) 13.0 - 17.0 g/dL   HCT 13.234.8 (L) 44.039.0 - 10.252.0 %   MCV 78.4 (L) 80.0 - 100.0 fL   MCH 22.5 (L) 26.0 - 34.0 pg   MCHC 28.7 (L) 30.0 - 36.0 g/dL   RDW 72.517.2 (H) 36.611.5 - 44.015.5 %   Platelets 217 150 - 400 K/uL   nRBC 0.0 0.0 - 0.2 %    Comment: Performed at [facility], [address] [place], [place] [address]  Basic metabolic panel     Status: Abnormal   Collection Time: 05/17/19  4:41 AM  Result Value Ref Range   Sodium 138 135 - 145 mmol/L   Potassium 3.5 3.5 - 5.1 mmol/L   Chloride 107 98 - 111 mmol/L   CO2 21 (L) 22 - 32 mmol/L   Glucose, Bld 131 (H) 70 - 99 mg/dL   BUN 23 8 - 23 mg/dL   Creatinine, Ser 5.951.45 (H) 0.61 - 1.24 mg/dL   Calcium 8.2 (L) 8.9 - 10.3 mg/dL   GFR calc non Af Amer 48 (L) >60 mL/min   GFR calc Af Amer 55 (L) >60 mL/min   Anion gap 10 5 - 15    Comment: Performed at [facility], [address] [place], [place] [address]  Lactic acid, plasma     Status: None   Collection Time: 05/17/19  8:29 AM  Result Value Ref Range   Lactic Acid, Venous 1.3 0.5 - 1.9 mmol/L    Comment: Performed at [facility], [address] [place], [place] [address]  Glucose, capillary     Status: Abnormal   Collection Time: 05/17/19  8:43 AM  Result Value Ref Range   Glucose-Capillary 69 (L) 70 - 99 mg/dL  Glucose, capillary     Status: Abnormal   Collection Time: 05/17/19 12:54 PM  Result Value Ref Range   Glucose-Capillary 126 (H) 70 - 99 mg/dL  Glucose, capillary      Status: Abnormal   Collection Time: 05/17/19  4:28 PM  Result Value Ref Range   Glucose-Capillary 198 (H) 70 - 99 mg/dL  Glucose, capillary     Status: Abnormal   Collection Time: 05/17/19  9:10 PM  Result Value Ref Range   Glucose-Capillary 147 (H) 70 - 99 mg/dL  Glucose, capillary     Status: None   Collection Time: 05/18/19 12:11 AM  Result  Value Ref Range   Glucose-Capillary 76 70 - 99 mg/dL  Glucose, capillary     Status: Abnormal   Collection Time: 05/18/19  2:06 AM  Result Value Ref Range   Glucose-Capillary 124 (H) 70 - 99 mg/dL  Procalcitonin     Status: None   Collection Time: 05/18/19  3:53 AM  Result Value Ref Range   Procalcitonin 2.29 ng/mL    Comment:        Interpretation: PCT > 2 ng/mL: Systemic infection (sepsis) is likely, unless other causes are known. (NOTE)       Sepsis PCT Algorithm           Lower Respiratory Tract                                      Infection PCT Algorithm    ----------------------------     ----------------------------         PCT < 0.25 ng/mL                PCT < 0.10 ng/mL         Strongly encourage             Strongly discourage   discontinuation of antibiotics    initiation of antibiotics    ----------------------------     -----------------------------       PCT 0.25 - 0.50 ng/mL            PCT 0.10 - 0.25 ng/mL               OR       >80% decrease in PCT            Discourage initiation of                                            antibiotics      Encourage discontinuation           of antibiotics    ----------------------------     -----------------------------         PCT >= 0.50 ng/mL              PCT 0.26 - 0.50 ng/mL               AND       <80% decrease in PCT              Encourage initiation of                                             antibiotics       Encourage continuation           of antibiotics    ----------------------------     -----------------------------        PCT >= 0.50 ng/mL                   PCT > 0.50 ng/mL               AND         increase in PCT  Strongly encourage                                      initiation of antibiotics    Strongly encourage escalation           of antibiotics                                     -----------------------------                                           PCT <= 0.25 ng/mL                                                 OR                                        > 80% decrease in PCT                                     Discontinue / Do not initiate                                             antibiotics Performed at [facility], [address] [place], [place] [address]   CBC     Status: Abnormal   Collection Time: 05/18/19  3:53 AM  Result Value Ref Range   WBC 5.7 4.0 - 10.5 K/uL   RBC 4.20 (L) 4.22 - 5.81 MIL/uL   Hemoglobin 9.4 (L) 13.0 - 17.0 g/dL   HCT 40.933.5 (L) 81.139.0 - 91.452.0 %   MCV 79.8 (L) 80.0 - 100.0 fL   MCH 22.4 (L) 26.0 - 34.0 pg   MCHC 28.1 (L) 30.0 - 36.0 g/dL   RDW 78.217.2 (H) 95.611.5 - 21.315.5 %   Platelets 236 150 - 400 K/uL   nRBC 0.0 0.0 - 0.2 %    Comment: Performed at [facility], [address] [place], [place] [address]  Basic metabolic panel     Status: Abnormal   Collection Time: 05/18/19  3:53 AM  Result Value Ref Range   Sodium 142 135 - 145 mmol/L   Potassium 3.2 (L) 3.5 - 5.1 mmol/L   Chloride 111 98 - 111 mmol/L   CO2 24 22 - 32 mmol/L   Glucose, Bld 107 (H) 70 - 99 mg/dL   BUN 29 (H) 8 - 23 mg/dL   Creatinine, Ser 8.461.45 (H) 0.61 - 1.24 mg/dL   Calcium 8.4 (L) 8.9 - 10.3 mg/dL   GFR calc non Af Amer 48 (L) >60 mL/min   GFR calc Af Amer 55 (L) >60 mL/min   Anion gap 7 5 - 15    Comment: Performed at [facility], [address] [place], [place]  [address]  Glucose, capillary     Status: None   Collection Time: 05/18/19  5:43 AM  Result Value Ref Range   Glucose-Capillary 84 70 - 99 mg/dL  Glucose, capillary     Status: None    Collection Time: 05/18/19  7:45 AM  Result Value Ref Range   Glucose-Capillary 75 70 - 99 mg/dL  Glucose, capillary     Status: Abnormal   Collection Time: 05/18/19 12:00 PM  Result Value Ref Range   Glucose-Capillary 123 (H) 70 - 99 mg/dL    MICRO: 4/018/12 afb cx pending 8/11 afb + 8/11 afb + 8/11 blood cx NGTD IMAGING: Ct Head Wo Contrast  Result Date: 05/17/2019 CLINICAL DATA:  Head trauma with ataxia EXAM: CT HEAD WITHOUT CONTRAST TECHNIQUE: Contiguous axial images were obtained from the base of the skull through the vertex without intravenous contrast. COMPARISON:  08/27/2008 head CT FINDINGS: Brain: No evidence of acute infarction, hemorrhage, hydrocephalus, extra-axial collection or mass lesion/mass effect. Mild motion artifact of the ventral brain causing duplicated appearance of the falx. Generalized atrophy, central predominant, progressed from 2009. Chronic small vessel ischemia that is confluent in the deep white matter-progressive. Remote lacunar infarct in the left thalamus. Vascular: Atherosclerotic calcification. The calcification has occurred at the right MCA and basilar since prior. Skull: Negative for fracture Sinuses/Orbits: No evidence of injury IMPRESSION: 1. No evidence of intracranial injury. 2. Atrophy and chronic small vessel ischemia with progression from 2009. 3. Mild motion artifact. Electronically Signed   By: Marnee SpringJonathon  Watts M.D.   On: 05/17/2019 04:55    Assessment/Plan:  73yo M with cavitary lung lesion,  Concern for NTM vs. mTB  - for now continue on amp/sub - await pcr probe to rule out mTB then can decide appropriate mycobacterial therapy - may consider starting RIPE tomorrow but will need to check if meds can be crushed.  - will check hiv disease  Weight loss and failure to thrive = can be associated with ntm/mtb infections. Continue with protein supplementation

## 2019-05-19 ENCOUNTER — Telehealth: Payer: Self-pay | Admitting: *Deleted

## 2019-05-19 ENCOUNTER — Inpatient Hospital Stay (HOSPITAL_COMMUNITY): Payer: Medicare Other

## 2019-05-19 LAB — CBC
HCT: 34.5 % — ABNORMAL LOW (ref 39.0–52.0)
Hemoglobin: 9.7 g/dL — ABNORMAL LOW (ref 13.0–17.0)
MCH: 22.8 pg — ABNORMAL LOW (ref 26.0–34.0)
MCHC: 28.1 g/dL — ABNORMAL LOW (ref 30.0–36.0)
MCV: 81 fL (ref 80.0–100.0)
Platelets: 212 10*3/uL (ref 150–400)
RBC: 4.26 MIL/uL (ref 4.22–5.81)
RDW: 17.6 % — ABNORMAL HIGH (ref 11.5–15.5)
WBC: 6.7 10*3/uL (ref 4.0–10.5)
nRBC: 0 % (ref 0.0–0.2)

## 2019-05-19 LAB — GLUCOSE, CAPILLARY
Glucose-Capillary: 109 mg/dL — ABNORMAL HIGH (ref 70–99)
Glucose-Capillary: 115 mg/dL — ABNORMAL HIGH (ref 70–99)
Glucose-Capillary: 128 mg/dL — ABNORMAL HIGH (ref 70–99)
Glucose-Capillary: 70 mg/dL (ref 70–99)
Glucose-Capillary: 79 mg/dL (ref 70–99)

## 2019-05-19 LAB — ACID FAST SMEAR (AFB, MYCOBACTERIA)
Acid Fast Smear: POSITIVE
Acid Fast Smear: POSITIVE — AB

## 2019-05-19 LAB — BASIC METABOLIC PANEL
Anion gap: 8 (ref 5–15)
BUN: 29 mg/dL — ABNORMAL HIGH (ref 8–23)
CO2: 23 mmol/L (ref 22–32)
Calcium: 8.7 mg/dL — ABNORMAL LOW (ref 8.9–10.3)
Chloride: 113 mmol/L — ABNORMAL HIGH (ref 98–111)
Creatinine, Ser: 1.42 mg/dL — ABNORMAL HIGH (ref 0.61–1.24)
GFR calc Af Amer: 57 mL/min — ABNORMAL LOW (ref >60)
GFR calc non Af Amer: 49 mL/min — ABNORMAL LOW (ref >60)
Glucose, Bld: 175 mg/dL — ABNORMAL HIGH (ref 70–99)
Potassium: 3.5 mmol/L (ref 3.5–5.1)
Sodium: 144 mmol/L (ref 135–145)

## 2019-05-19 LAB — BLOOD GAS, ARTERIAL
Acid-base deficit: 2.3 mmol/L — ABNORMAL HIGH (ref 0.0–2.0)
Bicarbonate: 23.2 mmol/L (ref 20.0–28.0)
Drawn by: 235321
O2 Content: 5 L/min
O2 Saturation: 86 %
Patient temperature: 97.6
pCO2 arterial: 45.1 mmHg (ref 32.0–48.0)
pH, Arterial: 7.329 — ABNORMAL LOW (ref 7.350–7.450)
pO2, Arterial: 53.1 mmHg — ABNORMAL LOW (ref 83.0–108.0)

## 2019-05-19 LAB — PROCALCITONIN: Procalcitonin: 1.7 ng/mL

## 2019-05-19 LAB — LACTIC ACID, PLASMA: Lactic Acid, Venous: 1.6 mmol/L (ref 0.5–1.9)

## 2019-05-19 LAB — BRAIN NATRIURETIC PEPTIDE: B Natriuretic Peptide: 995.9 pg/mL — ABNORMAL HIGH (ref 0.0–100.0)

## 2019-05-19 MED ORDER — POTASSIUM CHLORIDE CRYS ER 20 MEQ PO TBCR
40.0000 meq | EXTENDED_RELEASE_TABLET | Freq: Once | ORAL | Status: AC
  Administered 2019-05-19: 40 meq via ORAL
  Filled 2019-05-19: qty 2

## 2019-05-19 MED ORDER — IPRATROPIUM BROMIDE HFA 17 MCG/ACT IN AERS
2.0000 | INHALATION_SPRAY | Freq: Three times a day (TID) | RESPIRATORY_TRACT | Status: DC
  Administered 2019-05-19: 2 via RESPIRATORY_TRACT
  Filled 2019-05-19: qty 12.9

## 2019-05-19 MED ORDER — MOMETASONE FURO-FORMOTEROL FUM 200-5 MCG/ACT IN AERO
2.0000 | INHALATION_SPRAY | Freq: Two times a day (BID) | RESPIRATORY_TRACT | Status: DC
  Administered 2019-05-19 – 2019-07-25 (×128): 2 via RESPIRATORY_TRACT
  Filled 2019-05-19 (×4): qty 8.8

## 2019-05-19 MED ORDER — VITAMIN B-6 50 MG PO TABS
50.0000 mg | ORAL_TABLET | Freq: Every day | ORAL | Status: DC
  Administered 2019-05-19 – 2019-06-02 (×15): 50 mg via ORAL
  Filled 2019-05-19 (×15): qty 1

## 2019-05-19 MED ORDER — RIFAMPIN 300 MG PO CAPS
600.0000 mg | ORAL_CAPSULE | Freq: Every day | ORAL | Status: DC
  Administered 2019-05-19 – 2019-06-02 (×15): 600 mg via ORAL
  Filled 2019-05-19 (×15): qty 2

## 2019-05-19 MED ORDER — LEVALBUTEROL TARTRATE 45 MCG/ACT IN AERO
2.0000 | INHALATION_SPRAY | Freq: Three times a day (TID) | RESPIRATORY_TRACT | Status: DC
  Administered 2019-05-19: 2 via RESPIRATORY_TRACT
  Filled 2019-05-19: qty 15

## 2019-05-19 MED ORDER — ETHAMBUTOL HCL 400 MG PO TABS
800.0000 mg | ORAL_TABLET | Freq: Every day | ORAL | Status: DC
  Administered 2019-05-19 – 2019-05-23 (×5): 800 mg via ORAL
  Filled 2019-05-19 (×5): qty 2

## 2019-05-19 MED ORDER — ISONIAZID 300 MG PO TABS
300.0000 mg | ORAL_TABLET | Freq: Every day | ORAL | Status: DC
  Administered 2019-05-19 – 2019-06-02 (×15): 300 mg via ORAL
  Filled 2019-05-19 (×15): qty 1

## 2019-05-19 MED ORDER — ENOXAPARIN SODIUM 40 MG/0.4ML ~~LOC~~ SOLN
40.0000 mg | SUBCUTANEOUS | Status: DC
  Administered 2019-05-19 – 2019-07-09 (×52): 40 mg via SUBCUTANEOUS
  Filled 2019-05-19 (×54): qty 0.4

## 2019-05-19 MED ORDER — FUROSEMIDE 10 MG/ML IJ SOLN
40.0000 mg | Freq: Once | INTRAMUSCULAR | Status: AC
  Administered 2019-05-19: 40 mg via INTRAVENOUS

## 2019-05-19 MED ORDER — FUROSEMIDE 10 MG/ML IJ SOLN
20.0000 mg | Freq: Once | INTRAMUSCULAR | Status: AC
  Administered 2019-05-19: 20 mg via INTRAVENOUS
  Filled 2019-05-19: qty 2

## 2019-05-19 MED ORDER — PYRAZINAMIDE 500 MG PO TABS
1000.0000 mg | ORAL_TABLET | Freq: Every day | ORAL | Status: DC
  Administered 2019-05-19 – 2019-05-23 (×5): 1000 mg via ORAL
  Filled 2019-05-19 (×5): qty 2

## 2019-05-19 MED ORDER — FUROSEMIDE 10 MG/ML IJ SOLN
20.0000 mg | Freq: Once | INTRAMUSCULAR | Status: DC
  Filled 2019-05-19: qty 2

## 2019-05-19 NOTE — Telephone Encounter (Signed)
I was following along with some of the notes.  I'm sorry to hear that.  I hope he does well and appreciate Jenny Reichmann keeping [place] in the loop.

## 2019-05-19 NOTE — Progress Notes (Addendum)
Pt blood sugar was 70 but he has declined drinking or eating anything sweet because it hurts his throat and will only take a few sips and wash it down with water.  Sent on call a msg to see what can be given him

## 2019-05-19 NOTE — Progress Notes (Addendum)
PROGRESS NOTE  Clarence Maldonado:[id number] DOB: 30-Aug-1946 DOA: 05/15/2019 PCP: [name], DO  HPI/Recap of past 24 hours: Clarence Maldonado a 73 y.o.malewith medical history significant ofcardiomegaly, stroke, COPD, CKD stage III, tobacco abuse, DM 2, HTN who presented to the emergency room on 8/10 with progressive weakness over the last 3 months to the point where he was falling over.  Called EMS on night of 8/10 after falling again.  No fevers or headaches.  Does report decreased appetite and has lost approximately 43 pounds in the past 2 months..  Denied any history of TB or exposure to TB.  In the emergency room, COVID test negative.  CT scan of chest noted multiple cavitary lesions of unclear etiology with a differential diagnosis of malignancy versus TB versus atypical infection.  Please admitted to the hospital service and placed on isolation precautions.  AFB sputum was ordered.  QuantiFERON-TB gold also ordered.  On 8/11, patient noted to have increased heart rate and respiratory rate.  Negative lactic acid level.  Seen by speech therapy who found signs of significant dysphagia and patient placed on restricted diet.  Seen by pulmonary who started the patient on IV antibiotics.  05/19/19: Patient seen and examined at his bedside.  Conversational dyspnea noted on exam.  Increase oxygen demand.  ABG ordered and showed significant hypoxia with PaO2 57 and PCO2 45.  Patient placed on nonrebreather.  Ordered chest x-ray to further assess.  Due to increased respiration rate, oxygen demand, and high risk for decompensation patient will be transferred to higher level of care, stepdown unit.  Positive acid-fast smear.  Infectious disease following.   Assessment/Plan: Principal Problem:   Aspiration pneumonia (HCC) Active Problems:   Essential hypertension   CKD (chronic kidney disease) stage 3, GFR 30-59 ml/min (HCC)   Hyperlipidemia   Tobacco use disorder   Controlled type  2 diabetes mellitus with stage 3 chronic kidney disease, without long-term current use of insulin (HCC)   Weight loss   Hyperlipidemia associated with type 2 diabetes mellitus (HCC)   Cavitary lesion of lung   Hypokalemia   Cavitary pneumonia   Anemia  Bilateral cavitary pneumonia, with high suspicion for pulmonary TB TB Gold plus positive, acid-fast smear positive Infectious disease following, management per infectious disease. Procalcitonin 2.29 on 05/18/2019 >> trending down 1.70 on 05/19/2019 Personally reviewed CT chest during this admission which shows multiple bilateral cavitary lesions Currently on Unasyn day #3 Started on RIPE tuberculosis treatment by ID on 05/19/19 Continue Dulera Started on xopenex and ipratoprium inhalers 2 puffs q8h Maintain O2 saturation greater than 92% Repeat chest x-ray on 05/19/2019 due to increased work of breathing.  Newly diagnosed Pulmonary tuberculosis Management as stated above  Worsening acute hypoxic respiratory failure likely secondary to bilateral cavitary pneumonia, with concern for TB complicated by suspected aspiration pneumonia. Self-reported not on oxygen supplementation at baseline Currently requiring nonrebreather Moved to stepdown unit, high level of care, due to increased work of breathing DC IV fluid, give 1 dose IV Lasix 20 mg once due to soft blood pressure Continue bronchodilators PCCM following Chest x-ray 05/19/2019 personally reviewed showing worsening left lung infiltrates Started on tuberculosis treatment, also on IV unasyn for possible aspiration pneumonia  Currently requiring HFNC to maintain O2 sat in the 90's  Dysphagia with suspicion for aspiration PNA Speech therapist following On dysphagia 1 diet with honey thick liquids Aspirations precautions Continue unasyn  Questionable mass in left atrium on 2D echo 05/16/19 Could not  r/o vegetation on 2D echo 05/16/19 Severe thickening of the mitral valve leaflet. Severe  calcification of the mitral valve leaflet. There is severe mitral annular calcification present. Severe and bulky posterior annular calcification cannot r/o vegetation.  The aortic valve was not well visualized. Moderate thickening of the aortic valve. Sclerosis without any evidence of stenosis of the aortic valve. Aortic valve regurgitation is trivial by color flow Doppler. The aorta is normal in size and structure. Subcostal images suggest mobile mass in RA Suggest TEE if clinically indicated. Continue to closely monitor on telemetry Cardiology consulted and will see in consultation  Non sustained runs of Vtach Asymptomatic Obtain 12 lead EKG  Chronic diastolic CHF Appears euvolemic on exam however worsening CXR done today 05/19/19, shows possible pulmonary edema, reviewed personally Obtain BNP Lasix given IV lasix 40 mg once Strict I&O and daily weight  Type 2 diabetes, complicated by hypoglycemia and CKD 3 Hemoglobin A1c 6.3 Hypoglycemic overnight Hold off insulin sliding scale Encourage oral intake and avoid hypoglycemia  CKD 3 Appears to be close to his baseline with creatinine of 1.42 and GFR of 49 Continue to avoid nephrotoxins Continue to monitor urine output  Intermittent tachycardia Possibly related to his lung physiology Obtain twelve-lead EKG  Tobacco use disorder Nicotine patch as needed  Unintentional weight loss Dietitian following Continue oral supplement  Physical debility PT recommended SNF CSW consulted for possible SNF placement Fall precautions   Risks: Patient is at high risk for decompensation due to worsening acute hypoxic failure in the setting of newly diagnosed pulmonary tuberculosis complicated by suspected aspiration pneumonia.    Code Status: Full code  Family Communication:  None at bedside.  Disposition Plan: Skilled nursing facility when hemodynamically stable possibly in 2 to 3 days or when ID signs off.  Consultants:   Pulmonary  Infectious disease  Cardiology 05/19/19  Procedures:  None  Antimicrobials:  IV Unasyn 8/11-present  IV vancomycin 8/11 only  DVT prophylaxis: SCDs subcu Lovenox daily    Objective: Vitals:   05/18/19 1014 05/18/19 1524 05/18/19 2040 05/19/19 0508  BP: 123/73 140/83 (!) 145/85 (!) 147/76  Pulse: (!) 125 (!) 109 (!) 109 (!) 117  Resp: (!) 24 (!) 22 20 20   Temp:  98.4 F (36.9 C) 97.7 F (36.5 C) 97.6 F (36.4 C)  TempSrc:  Oral Oral Oral  SpO2: 99% 96% 93% 91%  Weight:      Height:        Intake/Output Summary (Last 24 hours) at 05/19/2019 8938 Last data filed at 05/19/2019 0600 Gross per 24 hour  Intake 3056.14 ml  Output 0 ml  Net 3056.14 ml   Filed Weights   05/15/19 1550 05/16/19 0629  Weight: 54.4 kg 54 kg    Exam:  . General: 72 y.o. year-old male well-developed well-nourished.  Conversational dyspnea on exam.  Very hard of hearing.  .  Cardiovascular: Tachycardic with no rubs or gallops no JVD or thyromegaly noted.   Marland Kitchen Respiratory: Diffuse rales bilaterally.  No wheezes noted.  Poor inspiratory effort.   . Abdomen: Soft obese nontender bowel sounds present.   . Musculoskeletal: Trace lower extremity edema.  2/4 pulses present in all 4 extremities. Marland Kitchen Psychiatry: Mood is appropriate for condition and setting.   Data Reviewed: CBC: Recent Labs  Lab 05/15/19 1627 05/16/19 0426 05/17/19 0441 05/18/19 0353 05/19/19 0713  WBC 6.3 7.8 7.0 5.7 6.7  NEUTROABS 5.4  --   --   --   --   HGB  9.4* 9.2* 10.0* 9.4* 9.7*  HCT 31.8* 30.9* 34.8* 33.5* 34.5*  MCV 76.6* 77.3* 78.4* 79.8* 81.0  PLT 240 215 217 236 212   Basic Metabolic Panel: Recent Labs  Lab 05/15/19 1627 05/16/19 0107 05/16/19 0426 05/17/19 0441 05/18/19 0353 05/19/19 0713  NA 132*  --  132* 138 142 144  K 2.6*  --  3.4* 3.5 3.2* 3.5  CL 98  --  100 107 111 113*  CO2 23  --  24 21* 24 23  GLUCOSE 135*  --  104* 131* 107* 175*  BUN 27*  --  23 23 29* 29*  CREATININE  1.55*  --  1.34* 1.45* 1.45* 1.42*  CALCIUM 8.0*  --  7.7* 8.2* 8.4* 8.7*  MG 1.9  --  2.0  --   --   --   PHOS  --  2.4* 2.3*  --   --   --    GFR: Estimated Creatinine Clearance: 35.9 mL/min (A) (by C-G formula based on SCr of 1.42 mg/dL (H)). Liver Function Tests: Recent Labs  Lab 05/15/19 1627 05/16/19 0426  AST 87* 44*  ALT 33 26  ALKPHOS 123 102  BILITOT 0.9 0.8  PROT 6.1* 5.6*  ALBUMIN 2.1* 1.9*   No results for input(s): LIPASE, AMYLASE in the last 168 hours. No results for input(s): AMMONIA in the last 168 hours. Coagulation Profile: No results for input(s): INR, PROTIME in the last 168 hours. Cardiac Enzymes: Recent Labs  Lab 05/16/19 0107  CKTOTAL 45*   BNP (last 3 results) No results for input(s): PROBNP in the last 8760 hours. HbA1C: No results for input(s): HGBA1C in the last 72 hours. CBG: Recent Labs  Lab 05/18/19 1642 05/18/19 2035 05/19/19 0017 05/19/19 0430 05/19/19 0756  GLUCAP 154* 89 79 70 128*   Lipid Profile: No results for input(s): CHOL, HDL, LDLCALC, TRIG, CHOLHDL, LDLDIRECT in the last 72 hours. Thyroid Function Tests: No results for input(s): TSH, T4TOTAL, FREET4, T3FREE, THYROIDAB in the last 72 hours. Anemia Panel: No results for input(s): VITAMINB12, FOLATE, FERRITIN, TIBC, IRON, RETICCTPCT in the last 72 hours. Urine analysis:    Component Value Date/Time   COLORURINE YELLOW 05/16/2019 0216   APPEARANCEUR CLEAR 05/16/2019 0216   LABSPEC 1.032 (H) 05/16/2019 0216   PHURINE 5.0 05/16/2019 0216   GLUCOSEU NEGATIVE 05/16/2019 0216   HGBUR MODERATE (A) 05/16/2019 0216   BILIRUBINUR NEGATIVE 05/16/2019 0216   KETONESUR NEGATIVE 05/16/2019 0216   PROTEINUR NEGATIVE 05/16/2019 0216   NITRITE NEGATIVE 05/16/2019 0216   LEUKOCYTESUR NEGATIVE 05/16/2019 0216   Sepsis Labs: @LABRCNTIP (procalcitonin:4,lacticidven:4)  ) Recent Results (from the past 240 hour(s))  SARS Coronavirus 2 [facility](Hospital order, Performed in [facility]  hospital lab) Nasopharyngeal Nasopharyngeal Swab     Status: None   Collection Time: 05/15/19  5:03 PM   Specimen: Nasopharyngeal Swab  Result Value Ref Range Status   SARS Coronavirus 2 NEGATIVE NEGATIVE Final    Comment: (NOTE) If result is NEGATIVE SARS-CoV-2 target nucleic acids are NOT DETECTED. The SARS-CoV-2 RNA is generally detectable in upper and lower  respiratory specimens during the acute phase of infection. The lowest  concentration of SARS-CoV-2 viral copies this assay can detect is 250  copies / mL. A negative result does not preclude SARS-CoV-2 infection  and should not be used as the sole basis for treatment or other  patient management decisions.  A negative result may occur with  improper specimen collection / handling, submission of specimen other  than  nasopharyngeal swab, presence of viral mutation(s) within the  areas targeted by this assay, and inadequate number of viral copies  (<250 copies / mL). A negative result must be combined with clinical  observations, patient history, and epidemiological information. If result is POSITIVE SARS-CoV-2 target nucleic acids are DETECTED. The SARS-CoV-2 RNA is generally detectable in upper and lower  respiratory specimens dur ing the acute phase of infection.  Positive  results are indicative of active infection with SARS-CoV-2.  Clinical  correlation with patient history and other diagnostic information is  necessary to determine patient infection status.  Positive results do  not rule out bacterial infection or co-infection with other viruses. If result is PRESUMPTIVE POSTIVE SARS-CoV-2 nucleic acids MAY BE PRESENT.   A presumptive positive result was obtained on the submitted specimen  and confirmed on repeat testing.  While 2019 novel coronavirus  (SARS-CoV-2) nucleic acids may be present in the submitted sample  additional confirmatory testing may be necessary for epidemiological  and / or clinical management  purposes  to differentiate between  SARS-CoV-2 and other Sarbecovirus currently known to infect humans.  If clinically indicated additional testing with an alternate test  methodology [phone]) is advised. The SARS-CoV-2 RNA is generally  detectable in upper and lower respiratory sp ecimens during the acute  phase of infection. The expected result is Negative. Fact Sheet for Patients:  BoilerBrush.com.cy Fact Sheet for Healthcare Providers: https://pope.com/ This test is not yet approved or cleared by the [place] FDA and has been authorized for detection and/or diagnosis of SARS-CoV-2 by FDA under an Emergency Use Authorization (EUA).  This EUA will remain in effect (meaning this test can be used) for the duration of the COVID-19 declaration under Section 564(b)(1) of the Act, 21 U.S.C. section 360bbb-3(b)(1), unless the authorization is terminated or revoked sooner. Performed at [facility], [address] [place], [place] [address]   Culture, blood (Routine X 2) w Reflex to ID Panel     Status: None (Preliminary result)   Collection Time: 05/16/19  1:07 AM   Specimen: BLOOD  Result Value Ref Range Status   Specimen Description   Final    BLOOD LEFT ANTECUBITAL Performed at [facility], [address], [place], [place] [address]    Special Requests   Final    BOTTLES DRAWN AEROBIC AND ANAEROBIC Blood Culture adequate volume Performed at [facility], [address], [place], [place] [address]    Culture   Final    NO GROWTH 3 DAYS Performed at [facility], [address] [place], [place] [address]    Report Status PENDING  Incomplete  Culture, blood (Routine X 2) w Reflex to ID Panel     Status: None (Preliminary result)   Collection Time: 05/16/19  1:07 AM   Specimen: BLOOD  Result Value Ref Range Status   Specimen Description   Final    BLOOD RIGHT  ANTECUBITAL Performed at [facility], [address] [place], [place] [address]    Special Requests   Final    BOTTLES DRAWN AEROBIC AND ANAEROBIC Blood Culture results may not be optimal due to an excessive volume of blood received in culture bottles Performed at [facility], [address], [place], [place] [address]    Culture   Final    NO GROWTH 3 DAYS Performed at [facility], [address], [place], [place] [address]    Report  Status PENDING  Incomplete  Acid Fast Smear (AFB)     Status: Abnormal   Collection Time: 05/16/19  2:16 AM   Specimen: Sputum  Result Value Ref Range Status   AFB Specimen Processing Concentration  Final   Acid Fast Smear Positive (A)  Final    Comment: (NOTE) 2+, 4-36 acid-fast bacilli per 10 fields at 400X magnification, fluorescent smear NOTIFIED KATHLEEN ON 05/17/19 AT 15:17 DS FAXED TO [phone] Performed At: [facility] [address] [place], [place] [id number] Jolene SchimkeNagendra Sanjai MD WG:[phone]    Source (AFB) SPUTUM  Final    Comment: Performed at [facility], [address] [place], [place] [address]  Acid Fast Smear (AFB)     Status: None   Collection Time: 05/16/19 12:07 PM   Specimen: Sputum  Result Value Ref Range Status   AFB Specimen Processing Concentration  Final   Acid Fast Smear Positive  Final    Comment: CRITICAL RESULT CALLED TO, READ BACK BY AND VERIFIED WITH: RITA,SAKAYI AT 0350 ON 05/19/19 BY A,MOHAMED (NOTE) 3+, 4-36 acid-fast bacilli per field at 400X magnification, fluorescent smear NOTIFIED KATHLEEN ON 05/17/19 AT 15:17 DS FAXED TO [phone] Performed At: [facility]  [address] [place], [place] [id number] Jolene SchimkeNagendra Sanjai MD DG:[phone]    Source (AFB) SPUTUM  Final    Comment: Performed at [facility], [address] [place], [place] [address]  MRSA PCR Screening     Status: None   Collection  Time: 05/16/19 12:07 PM   Specimen: Nasal Mucosa; Nasopharyngeal  Result Value Ref Range Status   MRSA by PCR NEGATIVE NEGATIVE Final    Comment:        The GeneXpert MRSA Assay (FDA approved for NASAL specimens only), is one component of a comprehensive MRSA colonization surveillance program. It is not intended to diagnose MRSA infection nor to guide or monitor treatment for MRSA infections. Performed at [facility], [address] [place], [place] [address]   Expectorated sputum assessment w rflx to resp cult     Status: None   Collection Time: 05/16/19 12:46 PM   Specimen: SPU  Result Value Ref Range Status   Specimen Description SPUTUM  Final   Special Requests NONE  Final   Sputum evaluation   Final    THIS SPECIMEN IS ACCEPTABLE FOR SPUTUM CULTURE Performed at [facility], [address] [place], [place] [address]    Report Status 05/16/2019 FINAL  Final  Culture, respiratory     Status: None   Collection Time: 05/16/19 12:46 PM   Specimen: SPU  Result Value Ref Range Status   Specimen Description   Final    SPUTUM Performed at [facility], [address] [place], [place] [address]    Special Requests   Final    NONE Reflexed from [id number] Performed at [facility], [address] [place], [place] [address]    Gram Stain   Final    MODERATE WBC PRESENT,BOTH PMN AND MONONUCLEAR MODERATE GRAM POSITIVE COCCI FEW GRAM VARIABLE ROD    Culture   Final    FEW Consistent with normal respiratory flora. Performed at [facility], [address] [place], [place] [address]    Report Status 05/18/2019 FINAL  Final  Acid Fast Smear (AFB)     Status: Abnormal   Collection Time: 05/17/19  8:08 PM   Specimen: Sputum  Result Value Ref Range Status  AFB Specimen Processing Concentration  Final   Acid Fast Smear Positive (A)  Final    Comment: (NOTE) 3+, 4-36 acid-fast bacilli per field  at 400X magnification, fluorescent smear Positive smear reported to Aisha M. on 05/19/19 at 3:34 A.M by BMS. Performed At: [facility] [address] [place], [place] [id number] Jolene SchimkeNagendra Sanjai MD WU:[id number]:[phone]    Source (AFB) SPUTUM  Final    Comment: Performed at [facility], [address] [place], [place] [address]      Studies: No results found.  Scheduled Meds: . buPROPion  150 mg Oral Daily  . feeding supplement (ENSURE ENLIVE)  237 mL Oral BID BM  . furosemide  20 mg Intravenous Once  . mouth rinse  15 mL Mouth Rinse BID  . mometasone-formoterol  2 puff Inhalation BID  . multivitamin with minerals  1 tablet Oral Daily  . potassium chloride  40 mEq Oral Once  . pravastatin  20 mg Oral q1800  . thiamine  100 mg Oral Daily    Continuous Infusions: . ampicillin-sulbactam (UNASYN) IV 1.5 g (05/19/19 0200)     LOS: 4 days     Darlin Droparole N Daric Koren, MD [facility] Pager [phone]  If 7PM-7AM, please contact night-coverage [email or web address] Password [email or web address] 05/19/2019, 9:18 AM

## 2019-05-19 NOTE — Progress Notes (Signed)
SLP Cancellation Note  Patient Details Name: Clarence Maldonado MRN: [id number] DOB: 09-23-1946   Cancelled treatment:       Reason Eval/Treat Not Completed: Medical issues which prohibited therapy(Pt with increased work of breathing and increased O2 needs (now on HFNC 10L) and to transfer to SDU per RN.  RN denies pt coughing with intake after eating fortunately.  Will check back as schedule permits.)   [name] 05/19/2019, 2:11 PM  Luanna Salk, [facility] SLP Acute Rehab Services Pager [phone] Office [phone]

## 2019-05-19 NOTE — Progress Notes (Signed)
[facility]    Date of Admission:  05/15/2019   Total days of antibiotics 4            ID: Clarence Maldonado is a 73 y.o. male with  Principal Problem:   Aspiration pneumonia (HCC) Active Problems:   Essential hypertension   CKD (chronic kidney disease) stage 3, GFR 30-59 ml/min (HCC)   Hyperlipidemia   Tobacco use disorder   Controlled type 2 diabetes mellitus with stage 3 chronic kidney disease, without long-term current use of insulin (HCC)   Weight loss   Hyperlipidemia associated with type 2 diabetes mellitus (HCC)   Cavitary lesion of lung   Hypokalemia   Cavitary pneumonia   Anemia    Subjective: Afebrile, still short of breath. He was transferred to step down for observation. Continues on 4LNC  Medications:  . buPROPion  150 mg Oral Daily  . enoxaparin (LOVENOX) injection  40 mg Subcutaneous Q24H  . ethambutol  800 mg Oral Daily  . feeding supplement (ENSURE ENLIVE)  237 mL Oral BID BM  . furosemide  20 mg Intravenous Once  . ipratropium  2 puff Inhalation Q8H  . isoniazid  300 mg Oral Daily   And  . vitamin B-6  50 mg Oral Daily  . levalbuterol  2 puff Inhalation Q8H  . mouth rinse  15 mL Mouth Rinse BID  . mometasone-formoterol  2 puff Inhalation BID  . multivitamin with minerals  1 tablet Oral Daily  . pravastatin  20 mg Oral q1800  . pyrazinamide  1,000 mg Oral Daily  . rifampin  600 mg Oral Daily  . thiamine  100 mg Oral Daily    Objective: Vital signs in last 24 hours: Temp:  [97.6 F (36.4 C)-97.7 F (36.5 C)] 97.6 F (36.4 C) (08/14 0508) Pulse Rate:  [109-117] 112 (08/14 1330) Resp:  [20-24] 23 (08/14 1330) BP: (131-147)/(76-85) 131/84 (08/14 1330) SpO2:  [91 %-98 %] 96 % (08/14 1330)  Physical Exam  Constitutional: He is oriented to person, place, and time. He appears well-developed and well-nourished. No distress.  HENT:  Mouth/Throat: Oropharynx is clear and moist. No oropharyngeal exudate.  Cardiovascular:  Normal rate, regular rhythm and normal heart sounds. Exam reveals no gallop and no friction rub.  No murmur heard.  Pulmonary/Chest: tachypnea with some accessory muscle use. and breath sounds normal. . He has no wheezes.  Abdominal: Soft. Bowel sounds are normal. He exhibits no distension. There is no tenderness.  Lymphadenopathy:  He has no cervical adenopathy.  Neurological: He is alert and oriented to person, place, and time.  Skin: Skin is warm and dry. No rash noted. No erythema.  Psychiatric: He has a normal mood and affect. His behavior is normal.      Lab Results Recent Labs    05/18/19 0353 05/19/19 0713  WBC 5.7 6.7  HGB 9.4* 9.7*  HCT 33.5* 34.5*  NA 142 144  K 3.2* 3.5  CL 111 113*  CO2 24 23  BUN 29* 29*  CREATININE 1.45* 1.42*    Microbiology:  Studies/Results: Dg Chest Port 1 View  Result Date: 05/19/2019 CLINICAL DATA:  Hypoxia. EXAM: PORTABLE CHEST 1 VIEW COMPARISON:  Radiographs and CT scan of May 15, 2019. FINDINGS: Stable cardiomediastinal silhouette. No pneumothorax is noted. There are again noted multiple ill-defined nodular opacities throughout the left lung which may be slightly more prominent. Is uncertain if these represent multifocal pneumonia or possibly metastatic disease or other  malignancy. The largest such abnormality is seen in the left lung apex. Minimal interstitial densities are noted in the right lung which may represent inflammation or scarring. Bony thorax is unremarkable. IMPRESSION: There are again noted multiple ill-defined nodular densities throughout the left lung which may be slightly more prominent compared to prior exam. Differential includes multifocal pneumonia or inflammation, or possibly metastatic disease or other malignancy. Electronically Signed   By: Marijo Conception M.D.   On: 05/19/2019 09:58     Assessment/Plan:  NTM vs. mTB cavitary lung disease =concern for mTB given heavy +++AFB on multiple symptoms. Unclear where  his exposure may have been years ago. we will start RIPE plus vitb6 daily until we have mTB probe results back.  RIPE x 2 months would be definitive treatment for latent TB as well vs. Making adjustment for NTM.   Secondary bacterial pneumonia = patient appears improving since starting amp/sub. Plan to treat for 7 d  Dr Lucianne Lei dam available for questions over the weekend. Will see back on [facility] Cell: [phone] Pager: [phone]  05/19/2019, 4:41 PM

## 2019-05-19 NOTE — Progress Notes (Signed)
PT Cancellation Note  Patient Details Name: Clarence Maldonado MRN: [id number] DOB: Jun 05, 1946   Cancelled Treatment:    Reason Eval/Treat Not Completed: Medical issues which prohibited therapy Pt with increased work of breathing and increased O2 needs today per chart review.  Pt to transfer to SDU.  Will check back as schedule permits.   Kariss Longmire,KATHrine E 05/19/2019, 11:14 AM Carmelia Bake, PT, DPT Acute Rehabilitation Services Office: [phone] Pager: [phone]

## 2019-05-19 NOTE — Telephone Encounter (Signed)
Clarence Maldonado, Friend was calling just to let you know that patient is in the Hospital with Tuberculosis. FYI

## 2019-05-19 NOTE — Progress Notes (Signed)
CRITICAL VALUE ALERT  Critical Value: acid fast smear positive  Date & Time Notied:  05/19/2019 @ 0400  Provider Notified: yes  Orders Received/Actions taken: awaiting orders

## 2019-05-20 DIAGNOSIS — T17908A Unspecified foreign body in respiratory tract, part unspecified causing other injury, initial encounter: Secondary | ICD-10-CM

## 2019-05-20 LAB — HIV ANTIBODY (ROUTINE TESTING W REFLEX): HIV Screen 4th Generation wRfx: NONREACTIVE

## 2019-05-20 LAB — GLUCOSE, CAPILLARY
Glucose-Capillary: 135 mg/dL — ABNORMAL HIGH (ref 70–99)
Glucose-Capillary: 139 mg/dL — ABNORMAL HIGH (ref 70–99)
Glucose-Capillary: 79 mg/dL (ref 70–99)
Glucose-Capillary: 85 mg/dL (ref 70–99)
Glucose-Capillary: 139 mg/dL — ABNORMAL HIGH (ref 70–99)
Glucose-Capillary: 105 mg/dL — ABNORMAL HIGH (ref 70–99)

## 2019-05-20 MED ORDER — IPRATROPIUM BROMIDE HFA 17 MCG/ACT IN AERS
2.0000 | INHALATION_SPRAY | Freq: Three times a day (TID) | RESPIRATORY_TRACT | Status: DC
  Administered 2019-05-20 – 2019-06-28 (×111): 2 via RESPIRATORY_TRACT
  Filled 2019-05-20 (×2): qty 12.9

## 2019-05-20 MED ORDER — FUROSEMIDE 10 MG/ML IJ SOLN
20.0000 mg | Freq: Two times a day (BID) | INTRAMUSCULAR | Status: DC
  Administered 2019-05-20 – 2019-05-21 (×3): 20 mg via INTRAVENOUS
  Filled 2019-05-20 (×3): qty 2

## 2019-05-20 MED ORDER — LEVALBUTEROL TARTRATE 45 MCG/ACT IN AERO
2.0000 | INHALATION_SPRAY | Freq: Three times a day (TID) | RESPIRATORY_TRACT | Status: DC
  Administered 2019-05-20 – 2019-05-27 (×24): 2 via RESPIRATORY_TRACT
  Filled 2019-05-20: qty 15

## 2019-05-20 MED ORDER — SODIUM CHLORIDE 0.9 % IV SOLN
INTRAVENOUS | Status: DC | PRN
  Administered 2019-05-20: 500 mL via INTRAVENOUS
  Administered 2019-07-22: 250 mL via INTRAVENOUS

## 2019-05-20 NOTE — Progress Notes (Signed)
PROGRESS NOTE  Clarence Maldonado QZE:[id number] DOB: 1946/04/27 DOA: 05/15/2019 PCP: Gayland Curry, DO  HPI/Recap of past 24 hours: Clarence Maldonado a 73 y.o.malewith medical history significant ofcardiomegaly, stroke, COPD, CKD stage III, tobacco abuse, DM 2, HTN who presented to the emergency room on 8/10 with progressive weakness over the last 3 months to the point where he was falling over.  Called EMS on night of 8/10 after falling again.  No fevers or headaches.  Does report decreased appetite and has lost approximately 43 pounds in the past 2 months..  Denied any history of TB or exposure to TB.  In the emergency room, COVID test negative.  CT scan of chest noted multiple cavitary lesions of unclear etiology with a differential diagnosis of malignancy versus TB versus atypical infection.  Please admitted to the hospital service and placed on isolation precautions.  AFB sputum was ordered.  QuantiFERON-TB gold also ordered.  On 8/11, patient noted to have increased heart rate and respiratory rate.  Negative lactic acid level.  Seen by speech therapy who found signs of significant dysphagia and patient placed on restricted diet.  Seen by pulmonary who started the patient on IV antibiotics.  05/19/19: Patient seen and examined at his bedside.  Conversational dyspnea noted on exam.  Increase oxygen demand.  ABG ordered and showed significant hypoxia with PaO2 57 and PCO2 45.  Patient placed on nonrebreather.  Ordered chest x-ray to further assess.  Due to increased respiration rate, oxygen demand, and high risk for decompensation patient will be transferred to higher level of care, stepdown unit.  Positive acid-fast smear.  Infectious disease following.  05/20/19: Patient was seen and examined at his bedside this morning.  He states his breathing is improved.  Denies chest pain.   Assessment/Plan: Principal Problem:   Aspiration pneumonia (HCC) Active Problems:   Essential  hypertension   CKD (chronic kidney disease) stage 3, GFR 30-59 ml/min (HCC)   Hyperlipidemia   Tobacco use disorder   Controlled type 2 diabetes mellitus with stage 3 chronic kidney disease, without long-term current use of insulin (HCC)   Weight loss   Hyperlipidemia associated with type 2 diabetes mellitus ([place])   Cavitary lesion of lung   Hypokalemia   Cavitary pneumonia   Anemia  Bilateral cavitary pneumonia, with high suspicion for pulmonary TB versus MAC TB Gold plus positive, acid-fast smear positive Infectious disease following, management per infectious disease. Procalcitonin 2.29 on 05/18/2019 >> trending down 1.70 on 05/19/2019 Personally reviewed CT chest during this admission which shows multiple bilateral cavitary lesions Currently on Unasyn day #3 Continue on RIPE tuberculosis treatment by ID started on 05/19/19 Continue Dulera inhalers Continue xopenex and ipratoprium inhalers 2 puffs q8h Maintain O2 saturation greater than 92% Repeat chest x-ray on 05/19/2019 due to increased work of breathing. Awaiting identification of AFB Continue airborne precautions  Acute hypoxic respiratory failure likely secondary to bilateral cavitary pneumonia, with concern for TB complicated by suspected aspiration pneumonia. Self-reported not on oxygen supplementation at baseline Currently requiring nonrebreather Moved to stepdown unit, high level of care, due to increased work of breathing DC IV fluid on 05/19/2019, give 1 dose IV Lasix 20 mg once due to soft blood pressure Continue bronchodilators PCCM following Chest x-ray 05/19/2019 personally reviewed showing worsening left lung infiltrates Started on tuberculosis treatment, also on IV unasyn for possible aspiration pneumonia  RIPE treatment x2 months plus vitamin B6 supplement Unasyn x7 days Continue to maintain O2 saturation greater than 92%  Dysphagia with suspicion for aspiration PNA Speech therapist following On dysphagia 1  diet with honey thick liquids Aspirations precautions Continue unasyn  Questionable mass in left atrium on 2D echo 05/16/19 Could not r/o vegetation on 2D echo 05/16/19 Severe thickening of the mitral valve leaflet. Severe calcification of the mitral valve leaflet. There is severe mitral annular calcification present. Severe and bulky posterior annular calcification cannot r/o vegetation.  The aortic valve was not well visualized. Moderate thickening of the aortic valve. Sclerosis without any evidence of stenosis of the aortic valve. Aortic valve regurgitation is trivial by color flow Doppler. The aorta is normal in size and structure. Subcostal images suggest mobile mass in RA Suggest TEE if clinically indicated. Continue to closely monitor on telemetry Cardiology consulted and will see in consultation  Non sustained runs of Vtach Asymptomatic Obtain 12 lead EKG  Chronic diastolic CHF Appears euvolemic on exam however worsening CXR done today 05/19/19, shows possible pulmonary edema, reviewed personally Elevated BNP greater than 900 no prior to compare Net I&O +4.2 L since admission.  UnClear if accurate. Started IV Lasix 20 mg twice daily as blood pressure tolerates Continue strict I&O and daily weight  Type 2 diabetes, complicated by hypoglycemia and CKD 3 Hemoglobin A1c 6.3 Hypoglycemic overnight Hold off insulin sliding scale Encourage oral intake and avoid hypoglycemia  CKD 3 Appears to be close to his baseline with creatinine of 1.42 and GFR of 49 Continue to avoid nephrotoxins Continue to monitor urine output  Intermittent tachycardia Possibly related to his lung physiology Obtain twelve-lead EKG  Tobacco use disorder Nicotine patch as needed  Unintentional weight loss Dietitian following Continue oral supplement  Physical debility PT recommended SNF CSW consulted for possible SNF placement Fall precautions     Code Status: Full code  Family  Communication:  None at bedside.  Disposition Plan: Skilled nursing facility when hemodynamically stable possibly in 2 to 3 days or when ID signs off.  Consultants:  Pulmonary  Infectious disease  Cardiology 05/19/19  Procedures:  None  Antimicrobials:  IV Unasyn 8/11-present  IV vancomycin 8/11 only  RIPE 05/19/19>>  DVT prophylaxis: SCDs subcu Lovenox daily    Objective: Vitals:   05/20/19 0900 05/20/19 1224 05/20/19 1232 05/20/19 1244  BP:   126/71   Pulse: 78  83 84  Resp: 14  (!) 21 (!) 23  Temp:  (!) 97.4 F (36.3 C)    TempSrc:  Oral    SpO2: 96%  94% 96%  Weight:      Height:        Intake/Output Summary (Last 24 hours) at 05/20/2019 1422 Last data filed at 05/20/2019 1300 Gross per 24 hour  Intake 1593.77 ml  Output 3425 ml  Net -1831.23 ml   Filed Weights   05/15/19 1550 05/16/19 0629  Weight: 54.4 kg 54 kg    Exam:  . General: 73 y.o. year-old male well-developed well-nourished no acute distress.  On nasal cannula.  No increased work of breathing.  Very hard of hearing.  .  Cardiovascular: Regular rate and rhythm no rubs or gallops no JVD or thyromegaly noted.   Marland Kitchen Respiratory: Diffuse rales bilaterally no wheezes noted.  Points with effort. . Abdomen: Soft obese nontender bowel sounds present..   . Musculoskeletal: Trace edema in lower extremities bilaterally.  2 out of 4 pulses in all 4 extremities.   Marland Kitchen Psychiatry: Mood is appropriate for condition and setting.   Data Reviewed: CBC: Recent Labs  Lab 05/15/19 1627  05/16/19 0426 05/17/19 0441 05/18/19 0353 05/19/19 0713  WBC 6.3 7.8 7.0 5.7 6.7  NEUTROABS 5.4  --   --   --   --   HGB 9.4* 9.2* 10.0* 9.4* 9.7*  HCT 31.8* 30.9* 34.8* 33.5* 34.5*  MCV 76.6* 77.3* 78.4* 79.8* 81.0  PLT 240 215 217 236 253   Basic Metabolic Panel: Recent Labs  Lab 05/15/19 1627 05/16/19 0107 05/16/19 0426 05/17/19 0441 05/18/19 0353 05/19/19 0713  NA 132*  --  132* 138 142 144  K 2.6*   --  3.4* 3.5 3.2* 3.5  CL 98  --  100 107 111 113*  CO2 23  --  24 21* 24 23  GLUCOSE 135*  --  104* 131* 107* 175*  BUN 27*  --  23 23 29* 29*  CREATININE 1.55*  --  1.34* 1.45* 1.45* 1.42*  CALCIUM 8.0*  --  7.7* 8.2* 8.4* 8.7*  MG 1.9  --  2.0  --   --   --   PHOS  --  2.4* 2.3*  --   --   --    GFR: Estimated Creatinine Clearance: 35.9 mL/min (A) (by C-G formula based on SCr of 1.42 mg/dL (H)). Liver Function Tests: Recent Labs  Lab 05/15/19 1627 05/16/19 0426  AST 87* 44*  ALT 33 26  ALKPHOS 123 102  BILITOT 0.9 0.8  PROT 6.1* 5.6*  ALBUMIN 2.1* 1.9*   No results for input(s): LIPASE, AMYLASE in the last 168 hours. No results for input(s): AMMONIA in the last 168 hours. Coagulation Profile: No results for input(s): INR, PROTIME in the last 168 hours. Cardiac Enzymes: Recent Labs  Lab 05/16/19 0107  CKTOTAL 45*   BNP (last 3 results) No results for input(s): PROBNP in the last 8760 hours. HbA1C: No results for input(s): HGBA1C in the last 72 hours. CBG: Recent Labs  Lab 05/19/19 2020 05/19/19 2346 05/20/19 0428 05/20/19 0829 05/20/19 1159  GLUCAP 139* 135* 79 85 139*   Lipid Profile: No results for input(s): CHOL, HDL, LDLCALC, TRIG, CHOLHDL, LDLDIRECT in the last 72 hours. Thyroid Function Tests: No results for input(s): TSH, T4TOTAL, FREET4, T3FREE, THYROIDAB in the last 72 hours. Anemia Panel: No results for input(s): VITAMINB12, FOLATE, FERRITIN, TIBC, IRON, RETICCTPCT in the last 72 hours. Urine analysis:    Component Value Date/Time   COLORURINE YELLOW 05/16/2019 0216   APPEARANCEUR CLEAR 05/16/2019 0216   LABSPEC 1.032 (H) 05/16/2019 0216   PHURINE 5.0 05/16/2019 0216   GLUCOSEU NEGATIVE 05/16/2019 0216   HGBUR MODERATE (A) 05/16/2019 0216   BILIRUBINUR NEGATIVE 05/16/2019 0216   KETONESUR NEGATIVE 05/16/2019 0216   PROTEINUR NEGATIVE 05/16/2019 0216   NITRITE NEGATIVE 05/16/2019 0216   LEUKOCYTESUR NEGATIVE 05/16/2019 0216   Sepsis  Labs: _0 (procalcitonin:4,lacticidven:4)  ) Recent Results (from the past 240 hour(s))  SARS Coronavirus 2 [facility] order, Performed in [facility] hospital lab) Nasopharyngeal Nasopharyngeal Swab     Status: None   Collection Time: 05/15/19  5:03 PM   Specimen: Nasopharyngeal Swab  Result Value Ref Range Status   SARS Coronavirus 2 NEGATIVE NEGATIVE Final    Comment: (NOTE) If result is NEGATIVE SARS-CoV-2 target nucleic acids are NOT DETECTED. The SARS-CoV-2 RNA is generally detectable in upper and lower  respiratory specimens during the acute phase of infection. The lowest  concentration of SARS-CoV-2 viral copies this assay can detect is 250  copies / mL. A negative result does not preclude SARS-CoV-2 infection  and should not be  used as the sole basis for treatment or other  patient management decisions.  A negative result may occur with  improper specimen collection / handling, submission of specimen other  than nasopharyngeal swab, presence of viral mutation(s) within the  areas targeted by this assay, and inadequate number of viral copies  (<250 copies / mL). A negative result must be combined with clinical  observations, patient history, and epidemiological information. If result is POSITIVE SARS-CoV-2 target nucleic acids are DETECTED. The SARS-CoV-2 RNA is generally detectable in upper and lower  respiratory specimens dur ing the acute phase of infection.  Positive  results are indicative of active infection with SARS-CoV-2.  Clinical  correlation with patient history and other diagnostic information is  necessary to determine patient infection status.  Positive results do  not rule out bacterial infection or co-infection with other viruses. If result is PRESUMPTIVE POSTIVE SARS-CoV-2 nucleic acids MAY BE PRESENT.   A presumptive positive result was obtained on the submitted specimen  and confirmed on repeat testing.  While 2019 novel coronavirus  (SARS-CoV-2)  nucleic acids may be present in the submitted sample  additional confirmatory testing may be necessary for epidemiological  and / or clinical management purposes  to differentiate between  SARS-CoV-2 and other Sarbecovirus currently known to infect humans.  If clinically indicated additional testing with an alternate test  methodology [phone]) is advised. The SARS-CoV-2 RNA is generally  detectable in upper and lower respiratory sp ecimens during the acute  phase of infection. The expected result is Negative. Fact Sheet for Patients:  StrictlyIdeas.no Fact Sheet for Healthcare Providers: BankingDealers.co.za This test is not yet approved or cleared by the [place] FDA and has been authorized for detection and/or diagnosis of SARS-CoV-2 by FDA under an Emergency Use Authorization (EUA).  This EUA will remain in effect (meaning this test can be used) for the duration of the COVID-19 declaration under Section 564(b)(1) of the Act, 21 U.S.C. section 360bbb-3(b)(1), unless the authorization is terminated or revoked sooner. Performed at [facility], [place] [address], [place], [place] [address]   Culture, blood (Routine X 2) w Reflex to ID Panel     Status: None (Preliminary result)   Collection Time: 05/16/19  1:07 AM   Specimen: BLOOD  Result Value Ref Range Status   Specimen Description   Final    BLOOD LEFT ANTECUBITAL Performed at [place] [address] [place], [place] [address]    Special Requests   Final    BOTTLES DRAWN AEROBIC AND ANAEROBIC Blood Culture adequate volume Performed at [place] [address], [place], [place] [address]    Culture   Final    NO GROWTH 4 DAYS Performed at [facility], [place] [address] [place], [place] [address]    Report Status PENDING  Incomplete  Culture, blood (Routine X 2) w Reflex to ID Panel     Status: None  (Preliminary result)   Collection Time: 05/16/19  1:07 AM   Specimen: BLOOD  Result Value Ref Range Status   Specimen Description   Final    BLOOD RIGHT ANTECUBITAL Performed at [place] [address] [place], [place] [address]    Special Requests   Final    BOTTLES DRAWN AEROBIC AND ANAEROBIC Blood Culture results may not be optimal due to an excessive volume of blood received in culture bottles Performed at [place] [address] [place], [place]  [address]    Culture   Final    NO GROWTH 4 DAYS Performed at [facility], [place] [address] [place], [place] [address]    Report Status PENDING  Incomplete  Acid Fast Smear (AFB)     Status: Abnormal   Collection Time: 05/16/19  2:16 AM   Specimen: Sputum  Result Value Ref Range Status   AFB Specimen Processing Concentration  Final   Acid Fast Smear Positive (A)  Final    Comment: (NOTE) 2+, 4-36 acid-fast bacilli per 10 fields at 400X magnification, fluorescent smear NOTIFIED KATHLEEN ON 05/17/19 AT 15:17 DS FAXED TO [phone] Performed At: [place], [place] [id number] Rush Farmer MD [id number]    Source (AFB) SPUTUM  Final    Comment: Performed at [facility], [place] [address] [place], [place] [address]  Acid Fast Smear (AFB)     Status: None   Collection Time: 05/16/19 12:07 PM   Specimen: Sputum  Result Value Ref Range Status   AFB Specimen Processing Concentration  Final   Acid Fast Smear Positive  Final    Comment: CRITICAL RESULT CALLED TO, READ BACK BY AND VERIFIED WITH: RITA,SAKAYI AT 0350 ON 05/19/19 BY A,MOHAMED (NOTE) 3+, 4-36 acid-fast bacilli per field at 400X magnification, fluorescent smear NOTIFIED KATHLEEN ON 05/17/19 AT 15:17 DS FAXED TO [phone] Performed At: [facility] [place], [place] [id number] Rush Farmer MD [id number]    Source (AFB) SPUTUM   Final    Comment: Performed at [facility], [place] [address], [place], [place] [address]  MRSA PCR Screening     Status: None   Collection Time: 05/16/19 12:07 PM   Specimen: Nasal Mucosa; Nasopharyngeal  Result Value Ref Range Status   MRSA by PCR NEGATIVE NEGATIVE Final    Comment:        The GeneXpert MRSA Assay (FDA approved for NASAL specimens only), is one component of a comprehensive MRSA colonization surveillance program. It is not intended to diagnose MRSA infection nor to guide or monitor treatment for MRSA infections. Performed at [facility], [place] [address] [place], [place] [address]   Expectorated sputum assessment w rflx to resp cult     Status: None   Collection Time: 05/16/19 12:46 PM   Specimen: SPU  Result Value Ref Range Status   Specimen Description SPUTUM  Final   Special Requests NONE  Final   Sputum evaluation   Final    THIS SPECIMEN IS ACCEPTABLE FOR SPUTUM CULTURE Performed at [facility], [place] [address] [place], [place] [address]    Report Status 05/16/2019 FINAL  Final  Culture, respiratory     Status: None   Collection Time: 05/16/19 12:46 PM   Specimen: SPU  Result Value Ref Range Status   Specimen Description   Final    SPUTUM Performed at [place] [address] [place], [place] [address]    Special Requests   Final    NONE Reflexed from [id number] Performed at [place] [address], [place], [place] [address]    Gram Stain   Final    MODERATE WBC PRESENT,BOTH PMN AND MONONUCLEAR MODERATE GRAM POSITIVE COCCI FEW GRAM VARIABLE ROD    Culture   Final    FEW Consistent with normal respiratory flora. Performed at [facility], [place] [name] [place], [place] [address]    Report Status 05/18/2019 FINAL  Final  Acid Fast Smear (AFB)     Status: Abnormal   Collection Time: 05/17/19  8:08 PM   Specimen: Sputum  Result Value Ref  Range Status   AFB Specimen Processing Concentration  Final   Acid Fast Smear Positive (A)  Final    Comment: (NOTE) 3+, 4-36 acid-fast bacilli per field at 400X magnification, fluorescent smear Positive smear reported to Aisha M. on 05/19/19 at 3:34 A.M by BMS. Performed At: [facility] [place], [place] [id number] Rush Farmer MD [id number]    Source (AFB) SPUTUM  Final    Comment: Performed at [facility], [place] [address] [place], [place] [address]      Studies: No results found.  Scheduled Meds: . buPROPion  150 mg Oral Daily  . enoxaparin (LOVENOX) injection  40 mg Subcutaneous Q24H  . ethambutol  800 mg Oral Daily  . feeding supplement (ENSURE ENLIVE)  237 mL Oral BID BM  . furosemide  20 mg Intravenous BID  . ipratropium  2 puff Inhalation TID  . isoniazid  300 mg Oral Daily   And  . vitamin B-6  50 mg Oral Daily  . levalbuterol  2 puff Inhalation TID  . mouth rinse  15 mL Mouth Rinse BID  . mometasone-formoterol  2 puff Inhalation BID  . multivitamin with minerals  1 tablet Oral Daily  . pravastatin  20 mg Oral q1800  . pyrazinamide  1,000 mg Oral Daily  . rifampin  600 mg Oral Daily  . thiamine  100 mg Oral Daily    Continuous Infusions: . sodium chloride 10 mL/hr at 05/20/19 1300  . ampicillin-sulbactam (UNASYN) IV Stopped (05/20/19 0901)     LOS: 5 days     Kayleen Memos, MD [facility] Pager [phone]  If 7PM-7AM, please contact night-coverage [email or web address] Password [email or web address] 05/20/2019, 2:22 PM

## 2019-05-20 NOTE — Progress Notes (Addendum)
NAME:  Clarence Maldonado, MRN:  [id number], DOB:  [birth year], LOS: 5 ADMISSION DATE:  05/15/2019, CONSULTATION DATE:  8/11 REFERRING MD:  Roel Cluck, CHIEF COMPLAINT:  Abnormal CT chest    Brief History   This is a 73 year old male who presented 8/10 w/ 2-3 mo hx of progressive weakness and cough. CT imaging showed diffuse cavitary nodular lung lesions. PCCM asked to eval.   History of present illness   This is a 73 year old male w/ hx as mentioned below. Lives at home alone. Presented to the [facility] ED on 8/10 w/ cc: weakness, cough and shortness of breath w/ sig decreased activity tolerance and falling at home (presented after friend called EMS). This had been slowly progressing over a time frame of 2-3 months.  -no fever no chills, can't say about wt loss (but compared w/ picture on chart appears sig), no dental pain or problems, no  hemoptysis, not able to tell when shortness of breath first noted, and keeps repeating "I don't know" when questioning requires more than 2-3 words.  No sick exposures, no  recent illness, no travel, no incarceration, no Tb exposure, worked in Architect prior to retiring. He's not able to tell me about possible environmental exposures or his home in general .  CT chest obtained showed diffuse widespread cavitary lung nodules w/ large cystic lesion in the RUL. PCCM asked to evaluate.   Past Medical History  Stroke (2009), COPD, HTN, DM, obesity, CKD stage III, tobacco abuse   Significant Hospital Events   8/10 admitted  Consults:  PCCM consulted 8/11 Id consulted 8/13 Procedures:    Significant Diagnostic Tests:  CT chest 8/10: Widespread nodular and cavitary lesions throughout both lungs with a larger air-filled cystic lesion in the right upper lobe. Overall appearance is nonspecific and differential could include atypical infection such as tuberculosis which requires exclusion versus other considerations including cavitating malignancy such as diffuse squamous  cell or septic pulmonary emboli.   Micro Data:  AFB smear 8/11>>>positive  afb 811>>> COVID-19: neg QF gold 8/11>>>positive  BCX2 8/11>>> resp culture 8/11>>> afb smear 8/11>>>positive  afb 8/11>>> afb smear 8/12>>>POS afb 8/12>>> Antimicrobials:  unasyn 8/11>>> RIPE 8/14 >>  Interim history/subjective:   Transferred to stepdown unit due to hypoxia Complains of dysphagia to liquids Afebrile   Objective   Blood pressure 127/68, pulse 78, temperature (!) 97.4 F (36.3 C), temperature source Oral, resp. rate 14, height _0  (1.676 m), weight 54 kg, SpO2 96 %.        Intake/Output Summary (Last 24 hours) at 05/20/2019 1048 Last data filed at 05/20/2019 1000 Gross per 24 hour  Intake 1449.32 ml  Output 3375 ml  Net -1925.68 ml   Filed Weights   05/15/19 1550 05/16/19 0629  Weight: 54.4 kg 54 kg    Examination: General elderly chronically ill appearing male. No acute distress HENT NCAT edentulous MMM no JVD pulm decreased bases.  Crackles left infrascapular Card RRR  abd not tender  gu voids Neuro awake and oriented    Resolved Hospital Problem list     Assessment & Plan:   Acute hypoxic respiratory failure Diffuse cavitary lung nodules  R/o cavitary PNA Failure to thrive DM COPD CKD stage III  Chest x-ray 8/14 personally reviewed which shows worsening nodular airspace disease on left and slight improvement on right  Diffuse cavitary lung nodules w/ Positive AFB/ + QF-gold and failure to thrive.  Ddx: include infectious such as MAC vs Tb.  Acute worsening could be related to aspiration pneumonia Plan Decrease FiO2 maintaining saturations 92% and above ID has started our IPE regimen and I agree while awaiting identification of AFB, airborne precautions meantime Cont unasyn for aspiration (rx 7d total) Aspiration precautions Needs barium swallow and we can proceed with this once AFB identified  Kara Mead MD. FCCP. [facility]  Pager [phone] If no response call [phone]   05/20/2019

## 2019-05-21 LAB — BASIC METABOLIC PANEL
Anion gap: 10 (ref 5–15)
BUN: 39 mg/dL — ABNORMAL HIGH (ref 8–23)
CO2: 31 mmol/L (ref 22–32)
Calcium: 8.9 mg/dL (ref 8.9–10.3)
Chloride: 105 mmol/L (ref 98–111)
Creatinine, Ser: 1.63 mg/dL — ABNORMAL HIGH (ref 0.61–1.24)
GFR calc Af Amer: 48 mL/min — ABNORMAL LOW (ref >60)
GFR calc non Af Amer: 41 mL/min — ABNORMAL LOW (ref >60)
Glucose, Bld: 76 mg/dL (ref 70–99)
Potassium: 2.6 mmol/L — CL (ref 3.5–5.1)
Sodium: 146 mmol/L — ABNORMAL HIGH (ref 135–145)

## 2019-05-21 LAB — CULTURE, BLOOD (ROUTINE X 2)
Culture: NO GROWTH
Culture: NO GROWTH
Special Requests: ADEQUATE

## 2019-05-21 LAB — GLUCOSE, CAPILLARY
Glucose-Capillary: 70 mg/dL (ref 70–99)
Glucose-Capillary: 80 mg/dL (ref 70–99)
Glucose-Capillary: 83 mg/dL (ref 70–99)
Glucose-Capillary: 209 mg/dL — ABNORMAL HIGH (ref 70–99)
Glucose-Capillary: 93 mg/dL (ref 70–99)
Glucose-Capillary: 116 mg/dL — ABNORMAL HIGH (ref 70–99)
Glucose-Capillary: 109 mg/dL — ABNORMAL HIGH (ref 70–99)

## 2019-05-21 MED ORDER — POTASSIUM CHLORIDE CRYS ER 20 MEQ PO TBCR
40.0000 meq | EXTENDED_RELEASE_TABLET | ORAL | Status: AC
  Administered 2019-05-21 (×2): 40 meq via ORAL
  Filled 2019-05-21 (×2): qty 2

## 2019-05-21 MED ORDER — ALUM & MAG HYDROXIDE-SIMETH 200-200-20 MG/5ML PO SUSP
30.0000 mL | ORAL | Status: DC | PRN
  Administered 2019-05-21 – 2019-05-27 (×3): 30 mL via ORAL
  Filled 2019-05-21 (×3): qty 30

## 2019-05-21 MED ORDER — POTASSIUM CHLORIDE 20 MEQ PO PACK
40.0000 meq | PACK | ORAL | Status: DC
  Filled 2019-05-21 (×2): qty 2

## 2019-05-21 MED ORDER — TRAZODONE HCL 50 MG PO TABS
50.0000 mg | ORAL_TABLET | Freq: Once | ORAL | Status: AC
  Administered 2019-05-21: 21:00:00 50 mg via ORAL
  Filled 2019-05-21: qty 1

## 2019-05-21 MED ORDER — FUROSEMIDE 10 MG/ML IJ SOLN
20.0000 mg | Freq: Two times a day (BID) | INTRAMUSCULAR | Status: AC
  Administered 2019-05-21 – 2019-05-22 (×2): 20 mg via INTRAVENOUS
  Filled 2019-05-21 (×2): qty 2

## 2019-05-21 MED ORDER — POTASSIUM CHLORIDE 20 MEQ PO PACK
40.0000 meq | PACK | ORAL | Status: DC
  Administered 2019-05-21: 40 meq via ORAL
  Filled 2019-05-21: qty 2

## 2019-05-21 NOTE — Progress Notes (Signed)
PROGRESS NOTE  Clarence Maldonado ZNB:[id number] DOB: 07-23-1946 DOA: 05/15/2019 PCP: Gayland Curry, DO  HPI/Recap of past 24 hours: Clarence Maldonado a 73 y.o.malewith medical history significant ofcardiomegaly, stroke, COPD, CKD stage III, tobacco abuse, DM 2, HTN who presented to the emergency room on 8/10 with progressive weakness over the last 3 months to the point where he was falling over.  Called EMS on night of 8/10 after falling again.  No fevers or headaches.  Does report decreased appetite and has lost approximately 43 pounds in the past 2 months..  Denied any history of TB or exposure to TB.  In the emergency room, COVID test negative.  CT scan of chest noted multiple cavitary lesions of unclear etiology with a differential diagnosis of malignancy versus TB versus atypical infection.  Please admitted to the hospital service and placed on isolation precautions.  AFB sputum was ordered.  QuantiFERON-TB gold also ordered.  On 8/11, patient noted to have increased heart rate and respiratory rate.  Negative lactic acid level.  Seen by speech therapy who found signs of significant dysphagia and patient placed on restricted diet.  Seen by pulmonary who started the patient on IV antibiotics.  Conversational dyspnea noted on exam on 8/14.  Increase oxygen demand.  ABG ordered and showed significant hypoxia with PaO2 57 and PCO2 45.  Patient placed on nonrebreather.  Ordered chest x-ray to further assess  Positive acid-fast smear.  Infectious disease following.  05/21/19: Patient was seen and examined at his bedside.  States his breathing is better.  He denies chest pain.  Continues to have productive cough.  Acid-fast culture with reflex sensitivities in process.   Assessment/Plan: Principal Problem:   Aspiration pneumonia (HCC) Active Problems:   Essential hypertension   CKD (chronic kidney disease) stage 3, GFR 30-59 ml/min (HCC)   Hyperlipidemia   Tobacco use disorder  Controlled type 2 diabetes mellitus with stage 3 chronic kidney disease, without long-term current use of insulin (HCC)   Weight loss   Hyperlipidemia associated with type 2 diabetes mellitus ([place])   Cavitary lesion of lung   Hypokalemia   Cavitary pneumonia   Anemia  Bilateral cavitary pneumonia, with high suspicion for pulmonary TB versus MAC TB Gold plus positive, acid-fast smear positive Infectious disease following, management per infectious disease. Procalcitonin 2.29 on 05/18/2019 >> trending down 1.70 on 05/19/2019 Personally reviewed CT chest during this admission which shows multiple bilateral cavitary lesions Currently on Unasyn day #4 for suspected aspiration pneumonia Acid-fast smear positive QuantiFERON-TB Gold plus positive COVID-19 negative HIV screen nonreactive Blood cultures x2 drawn on 05/16/2019- x4 days Currently on RIPE tuberculosis treatment by ID started on 05/19/19 Continue Dulera inhalers Continue xopenex and ipratoprium inhalers 2 puffs q8h Maintain O2 saturation greater than 92% Repeat chest x-ray on 05/19/2019 due to increased work of breathing. Awaiting identification of AFB Acid-fast culture with sensitivities in process Continue airborne precautions  Acute hypoxic respiratory failure likely secondary to bilateral cavitary pneumonia, with concern for TB complicated by suspected aspiration pneumonia. Self-reported not on oxygen supplementation at baseline Oxygen requirement is improving Moved to stepdown unit, high level of care, due to increased work of breathing on 05/19/2019 On IV Lasix 20 mg twice daily x1 day.  Reassess in the morning. Continue bronchodilators Currently on Xopenex inhalers 2 puffs 3 times daily, ipratropium inhalers 2 puffs 3 times daily PCCM following Chest x-ray 05/19/2019 personally reviewed showing worsening left lung infiltrates Started on tuberculosis treatment, also on IV unasyn  for possible aspiration pneumonia  RIPE  treatment x2 months plus vitamin B6 supplement Unasyn x7 days Continue to maintain O2 saturation greater than 92%  Dysphagia with suspicion for aspiration PNA Speech therapist following On dysphagia 1 diet with honey thick liquids Needs barium swallow Per PCCM can proceed with barium swallow once AFB is identified. Continue aspirations precautions Continue Unasyn  Hypokalemia Potassium 2.6 Likely secondary to diuretics Replete as indicated Repeat BMP in the morning  Questionable mass in left atrium on 2D echo 05/16/19 Could not r/o vegetation on 2D echo 05/16/19 Severe thickening of the mitral valve leaflet. Severe calcification of the mitral valve leaflet. There is severe mitral annular calcification present. Severe and bulky posterior annular calcification cannot r/o vegetation.  The aortic valve was not well visualized. Moderate thickening of the aortic valve. Sclerosis without any evidence of stenosis of the aortic valve. Aortic valve regurgitation is trivial by color flow Doppler. The aorta is normal in size and structure. Subcostal images suggest mobile mass in RA Suggest TEE if clinically indicated. Continue to closely monitor on telemetry Cardiology consulted and will see in consultation Contact cardiology on 05/22/2019.  Acknowledged consult by cardio master on 05/19/2019.  Non sustained runs of Vtach Asymptomatic 12 lead EKG 05/19/2019 shows sinus tach rate of 103 with no specific ST-T changes.  Chronic diastolic CHF Appears euvolemic on exam however worsening CXR done today 05/19/19, shows possible pulmonary edema, reviewed personally Elevated BNP greater than 900 no prior to compare Net I&O +4.2 L since admission.  UnClear if accurate. Started IV Lasix 20 mg twice daily as blood pressure tolerates Continue strict I&O and daily weight  Type 2 diabetes, complicated by hypoglycemia and CKD 3 Hemoglobin A1c 6.3 Hypoglycemic overnight Hold off insulin sliding scale  Encourage oral intake and avoid hypoglycemia  CKD 3 Appears to be close to his baseline with creatinine of 1.42 and GFR of 49 Creatinine 1.63 on 05/21/2019 Stop date on IV Lasix Continue to avoid nephrotoxins Continue to closely monitor urine output.  Resolved intermittent tachycardia  Tobacco use disorder Nicotine patch as needed  Unintentional weight loss Dietitian following Continue oral supplement  Physical debility PT recommended SNF CSW consulted for possible SNF placement Fall precautions     Code Status: Full code  Family Communication:  None at bedside.  Disposition Plan: Skilled nursing facility when hemodynamically stable possibly in 2 to 3 days or when ID signs off.  Consultants:  Pulmonary  Infectious disease  Cardiology 05/19/19  Procedures:  None  Antimicrobials:  IV Unasyn 8/11-present  IV vancomycin 8/11 only  RIPE 05/19/19>>  DVT prophylaxis: SCDs subcu Lovenox daily    Objective: Vitals:   05/21/19 0500 05/21/19 0800 05/21/19 0836 05/21/19 1100  BP:  135/74    Pulse:  (!) 106    Resp:  (!) 27    Temp:   (!) 97.4 F (36.3 C) 97.9 F (36.6 C)  TempSrc:   Oral   SpO2:  93%    Weight: 65.8 kg     Height:        Intake/Output Summary (Last 24 hours) at 05/21/2019 1204 Last data filed at 05/21/2019 0405 Gross per 24 hour  Intake 725.9 ml  Output 2875 ml  Net -2149.1 ml   Filed Weights   05/15/19 1550 05/16/19 0629 05/21/19 0500  Weight: 54.4 kg 54 kg 65.8 kg    Exam:  . General: 73 y.o. year-old male well-developed well-nourished no acute distress.  Alert and interactive.  Very hard  of hearing.  .  Cardiovascular: Regular rate and rhythm no rubs or gallops.  No JVD or thyromegaly noted.   Marland Kitchen Respiratory: Diffuse rales Bilaterally No Wheezes Noted.  Poor inspiratory effort. . Abdomen: Soft obese nontender nondistended normal bowel sounds present. . Musculoskeletal: Trace edema in lower extremities bilaterally 2 out  of 4 pulses in all 4 extremities.   Marland Kitchen Psychiatry: Mood is appropriate for condition and setting..   Data Reviewed: CBC: Recent Labs  Lab 05/15/19 1627 05/16/19 0426 05/17/19 0441 05/18/19 0353 05/19/19 0713  WBC 6.3 7.8 7.0 5.7 6.7  NEUTROABS 5.4  --   --   --   --   HGB 9.4* 9.2* 10.0* 9.4* 9.7*  HCT 31.8* 30.9* 34.8* 33.5* 34.5*  MCV 76.6* 77.3* 78.4* 79.8* 81.0  PLT 240 215 217 236 364   Basic Metabolic Panel: Recent Labs  Lab 05/15/19 1627 05/16/19 0107 05/16/19 0426 05/17/19 0441 05/18/19 0353 05/19/19 0713 05/21/19 0456  NA 132*  --  132* 138 142 144 146*  K 2.6*  --  3.4* 3.5 3.2* 3.5 2.6*  CL 98  --  100 107 111 113* 105  CO2 23  --  24 21* _0 GLUCOSE 135*  --  104* 131* 107* 175* 76  BUN 27*  --  23 23 29* 29* 39*  CREATININE 1.55*  --  1.34* 1.45* 1.45* 1.42* 1.63*  CALCIUM 8.0*  --  7.7* 8.2* 8.4* 8.7* 8.9  MG 1.9  --  2.0  --   --   --   --   PHOS  --  2.4* 2.3*  --   --   --   --    GFR: Estimated Creatinine Clearance: 37 mL/min (A) (by C-G formula based on SCr of 1.63 mg/dL (H)). Liver Function Tests: Recent Labs  Lab 05/15/19 1627 05/16/19 0426  AST 87* 44*  ALT 33 26  ALKPHOS 123 102  BILITOT 0.9 0.8  PROT 6.1* 5.6*  ALBUMIN 2.1* 1.9*   No results for input(s): LIPASE, AMYLASE in the last 168 hours. No results for input(s): AMMONIA in the last 168 hours. Coagulation Profile: No results for input(s): INR, PROTIME in the last 168 hours. Cardiac Enzymes: Recent Labs  Lab 05/16/19 0107  CKTOTAL 45*   BNP (last 3 results) No results for input(s): PROBNP in the last 8760 hours. HbA1C: No results for input(s): HGBA1C in the last 72 hours. CBG: Recent Labs  Lab 05/20/19 1538 05/20/19 2002 05/20/19 2352 05/21/19 0408 05/21/19 0822  GLUCAP 105* 80 83 70 209*   Lipid Profile: No results for input(s): CHOL, HDL, LDLCALC, TRIG, CHOLHDL, LDLDIRECT in the last 72 hours. Thyroid Function Tests: No results for input(s): TSH,  T4TOTAL, FREET4, T3FREE, THYROIDAB in the last 72 hours. Anemia Panel: No results for input(s): VITAMINB12, FOLATE, FERRITIN, TIBC, IRON, RETICCTPCT in the last 72 hours. Urine analysis:    Component Value Date/Time   COLORURINE YELLOW 05/16/2019 0216   APPEARANCEUR CLEAR 05/16/2019 0216   LABSPEC 1.032 (H) 05/16/2019 0216   PHURINE 5.0 05/16/2019 0216   GLUCOSEU NEGATIVE 05/16/2019 0216   HGBUR MODERATE (A) 05/16/2019 0216   BILIRUBINUR NEGATIVE 05/16/2019 0216   KETONESUR NEGATIVE 05/16/2019 0216   PROTEINUR NEGATIVE 05/16/2019 0216   NITRITE NEGATIVE 05/16/2019 0216   LEUKOCYTESUR NEGATIVE 05/16/2019 0216   Sepsis Labs: _1 (procalcitonin:4,lacticidven:4)  ) Recent Results (from the past 240 hour(s))  SARS Coronavirus 2 [facility] order, Performed in [facility] hospital lab) Nasopharyngeal Nasopharyngeal Swab  Status: None   Collection Time: 05/15/19  5:03 PM   Specimen: Nasopharyngeal Swab  Result Value Ref Range Status   SARS Coronavirus 2 NEGATIVE NEGATIVE Final    Comment: (NOTE) If result is NEGATIVE SARS-CoV-2 target nucleic acids are NOT DETECTED. The SARS-CoV-2 RNA is generally detectable in upper and lower  respiratory specimens during the acute phase of infection. The lowest  concentration of SARS-CoV-2 viral copies this assay can detect is 250  copies / mL. A negative result does not preclude SARS-CoV-2 infection  and should not be used as the sole basis for treatment or other  patient management decisions.  A negative result may occur with  improper specimen collection / handling, submission of specimen other  than nasopharyngeal swab, presence of viral mutation(s) within the  areas targeted by this assay, and inadequate number of viral copies  (<250 copies / mL). A negative result must be combined with clinical  observations, patient history, and epidemiological information. If result is POSITIVE SARS-CoV-2 target nucleic acids are DETECTED. The  SARS-CoV-2 RNA is generally detectable in upper and lower  respiratory specimens dur ing the acute phase of infection.  Positive  results are indicative of active infection with SARS-CoV-2.  Clinical  correlation with patient history and other diagnostic information is  necessary to determine patient infection status.  Positive results do  not rule out bacterial infection or co-infection with other viruses. If result is PRESUMPTIVE POSTIVE SARS-CoV-2 nucleic acids MAY BE PRESENT.   A presumptive positive result was obtained on the submitted specimen  and confirmed on repeat testing.  While 2019 novel coronavirus  (SARS-CoV-2) nucleic acids may be present in the submitted sample  additional confirmatory testing may be necessary for epidemiological  and / or clinical management purposes  to differentiate between  SARS-CoV-2 and other Sarbecovirus currently known to infect humans.  If clinically indicated additional testing with an alternate test  methodology [phone]) is advised. The SARS-CoV-2 RNA is generally  detectable in upper and lower respiratory sp ecimens during the acute  phase of infection. The expected result is Negative. Fact Sheet for Patients:  StrictlyIdeas.no Fact Sheet for Healthcare Providers: BankingDealers.co.za This test is not yet approved or cleared by the [place] FDA and has been authorized for detection and/or diagnosis of SARS-CoV-2 by FDA under an Emergency Use Authorization (EUA).  This EUA will remain in effect (meaning this test can be used) for the duration of the COVID-19 declaration under Section 564(b)(1) of the Act, 21 U.S.C. section 360bbb-3(b)(1), unless the authorization is terminated or revoked sooner. Performed at [facility], [place] [address] [place], [place] [address]   Culture, blood (Routine X 2) w Reflex to ID Panel     Status: None   Collection Time: 05/16/19  1:07 AM    Specimen: BLOOD  Result Value Ref Range Status   Specimen Description   Final    BLOOD LEFT ANTECUBITAL Performed at [facility] [address] [place], [place] [address]    Special Requests   Final    BOTTLES DRAWN AEROBIC AND ANAEROBIC Blood Culture adequate volume Performed at [place] [address] [place], [place] [address]    Culture   Final    NO GROWTH 5 DAYS Performed at [facility], [place] [address] [place], [place] [address]    Report Status 05/21/2019 FINAL  Final  Culture, blood (Routine X 2) w Reflex to ID Panel  Status: None   Collection Time: 05/16/19  1:07 AM   Specimen: BLOOD  Result Value Ref Range Status   Specimen Description   Final    BLOOD RIGHT ANTECUBITAL Performed at [facility] [address] [place], [place] [address]    Special Requests   Final    BOTTLES DRAWN AEROBIC AND ANAEROBIC Blood Culture results may not be optimal due to an excessive volume of blood received in culture bottles Performed at [facility] [address], [place], [place] [address]    Culture   Final    NO GROWTH 5 DAYS Performed at [facility], [place] [address], [place], [place] [address]    Report Status 05/21/2019 FINAL  Final  Acid Fast Smear (AFB)     Status: Abnormal   Collection Time: 05/16/19  2:16 AM   Specimen: Sputum  Result Value Ref Range Status   AFB Specimen Processing Concentration  Final   Acid Fast Smear Positive (A)  Final    Comment: (NOTE) 2+, 4-36 acid-fast bacilli per 10 fields at 400X magnification, fluorescent smear NOTIFIED KATHLEEN ON 05/17/19 AT 15:17 DS FAXED TO [phone] Performed At: [facility] [place], [place] [id number] Rush Farmer MD [id number]    Source (AFB) SPUTUM  Final    Comment: Performed at [facility], [place] [address], [place], [place] [address]  Acid Fast Smear (AFB)      Status: None   Collection Time: 05/16/19 12:07 PM   Specimen: Sputum  Result Value Ref Range Status   AFB Specimen Processing Concentration  Final   Acid Fast Smear Positive  Final    Comment: CRITICAL RESULT CALLED TO, READ BACK BY AND VERIFIED WITH: RITA,SAKAYI AT 0350 ON 05/19/19 BY A,MOHAMED (NOTE) 3+, 4-36 acid-fast bacilli per field at 400X magnification, fluorescent smear NOTIFIED KATHLEEN ON 05/17/19 AT 15:17 DS FAXED TO [phone] Performed At: [facility] [place], [place] [id number] Rush Farmer MD KX:[id number]    Source (AFB) SPUTUM  Final    Comment: Performed at [facility], [place] [address] [place], [place] [address]  MRSA PCR Screening     Status: None   Collection Time: 05/16/19 12:07 PM   Specimen: Nasal Mucosa; Nasopharyngeal  Result Value Ref Range Status   MRSA by PCR NEGATIVE NEGATIVE Final    Comment:        The GeneXpert MRSA Assay (FDA approved for NASAL specimens only), is one component of a comprehensive MRSA colonization surveillance program. It is not intended to diagnose MRSA infection nor to guide or monitor treatment for MRSA infections. Performed at [facility], [place] [address] [place], [place] [address]   Expectorated sputum assessment w rflx to resp cult     Status: None   Collection Time: 05/16/19 12:46 PM   Specimen: SPU  Result Value Ref Range Status   Specimen Description SPUTUM  Final   Special Requests NONE  Final   Sputum evaluation   Final    THIS SPECIMEN IS ACCEPTABLE FOR SPUTUM CULTURE Performed at [facility], [place] [address], [place], [place] [address]    Report Status 05/16/2019 FINAL  Final  Culture, respiratory     Status: None   Collection Time: 05/16/19 12:46 PM   Specimen: SPU  Result Value Ref Range Status   Specimen Description   Final    SPUTUM Performed at [facility]  [address]  [place], [place] [address]    Special Requests   Final    NONE Reflexed from [phone] Performed at [facility], [place] [address], [place], [place] [address]    Gram Stain   Final    MODERATE WBC PRESENT,BOTH PMN AND MONONUCLEAR MODERATE GRAM POSITIVE COCCI FEW GRAM VARIABLE ROD    Culture   Final    FEW Consistent with normal respiratory flora. Performed at [facility], [place] [address] [place], [place] [address]    Report Status 05/18/2019 FINAL  Final  Acid Fast Smear (AFB)     Status: Abnormal   Collection Time: 05/17/19  8:08 PM   Specimen: Sputum  Result Value Ref Range Status   AFB Specimen Processing Concentration  Final   Acid Fast Smear Positive (A)  Final    Comment: (NOTE) 3+, 4-36 acid-fast bacilli per field at 400X magnification, fluorescent smear Positive smear reported to Aisha M. on 05/19/19 at 3:34 A.M by BMS. Performed At: [facility] [place], [place] [id number] Rush Farmer MD [id number]    Source (AFB) SPUTUM  Final    Comment: Performed at [facility], [place] [address], [place], [place] [address]      Studies: No results found.  Scheduled Meds: . buPROPion  150 mg Oral Daily  . enoxaparin (LOVENOX) injection  40 mg Subcutaneous Q24H  . ethambutol  800 mg Oral Daily  . feeding supplement (ENSURE ENLIVE)  237 mL Oral BID BM  . furosemide  20 mg Intravenous BID  . ipratropium  2 puff Inhalation TID  . isoniazid  300 mg Oral Daily   And  . vitamin B-6  50 mg Oral Daily  . levalbuterol  2 puff Inhalation TID  . mouth rinse  15 mL Mouth Rinse BID  . mometasone-formoterol  2 puff Inhalation BID  . multivitamin with minerals  1 tablet Oral Daily  . potassium chloride  40 mEq Oral Q4H  . pravastatin  20 mg Oral q1800  . pyrazinamide  1,000 mg Oral Daily  . rifampin  600 mg Oral Daily  . thiamine  100 mg Oral Daily    Continuous Infusions: . sodium chloride 10 mL/hr at 05/20/19 1800   . ampicillin-sulbactam (UNASYN) IV Stopped (05/21/19 1008)     LOS: 6 days     Kayleen Memos, MD [facility] Pager [phone]  If 7PM-7AM, please contact night-coverage [email or web address] Password [facility] 05/21/2019, 12:04 PM

## 2019-05-22 LAB — BASIC METABOLIC PANEL
Anion gap: 12 (ref 5–15)
BUN: 41 mg/dL — ABNORMAL HIGH (ref 8–23)
CO2: 29 mmol/L (ref 22–32)
Calcium: 8.5 mg/dL — ABNORMAL LOW (ref 8.9–10.3)
Chloride: 103 mmol/L (ref 98–111)
Creatinine, Ser: 1.62 mg/dL — ABNORMAL HIGH (ref 0.61–1.24)
GFR calc Af Amer: 48 mL/min — ABNORMAL LOW (ref >60)
GFR calc non Af Amer: 42 mL/min — ABNORMAL LOW (ref >60)
Glucose, Bld: 86 mg/dL (ref 70–99)
Potassium: 2.9 mmol/L — ABNORMAL LOW (ref 3.5–5.1)
Sodium: 144 mmol/L (ref 135–145)

## 2019-05-22 LAB — GLUCOSE, CAPILLARY
Glucose-Capillary: 88 mg/dL (ref 70–99)
Glucose-Capillary: 110 mg/dL — ABNORMAL HIGH (ref 70–99)
Glucose-Capillary: 64 mg/dL — ABNORMAL LOW (ref 70–99)
Glucose-Capillary: 64 mg/dL — ABNORMAL LOW (ref 70–99)
Glucose-Capillary: 72 mg/dL (ref 70–99)
Glucose-Capillary: 86 mg/dL (ref 70–99)
Glucose-Capillary: 88 mg/dL (ref 70–99)
Glucose-Capillary: 94 mg/dL (ref 70–99)

## 2019-05-22 LAB — MAGNESIUM: Magnesium: 1.9 mg/dL (ref 1.7–2.4)

## 2019-05-22 MED ORDER — POTASSIUM CHLORIDE 10 MEQ/100ML IV SOLN
10.0000 meq | INTRAVENOUS | Status: AC
  Administered 2019-05-22 (×4): 10 meq via INTRAVENOUS
  Filled 2019-05-22 (×4): qty 100

## 2019-05-22 MED ORDER — PANTOPRAZOLE SODIUM 40 MG PO TBEC
40.0000 mg | DELAYED_RELEASE_TABLET | Freq: Every day | ORAL | Status: DC
  Administered 2019-05-22 – 2019-06-19 (×27): 40 mg via ORAL
  Filled 2019-05-22 (×30): qty 1

## 2019-05-22 MED ORDER — CHLORHEXIDINE GLUCONATE CLOTH 2 % EX PADS
6.0000 | MEDICATED_PAD | Freq: Every day | CUTANEOUS | Status: DC
  Administered 2019-05-22 – 2019-05-23 (×2): 6 via TOPICAL

## 2019-05-22 MED ORDER — LOPERAMIDE HCL 2 MG PO CAPS
2.0000 mg | ORAL_CAPSULE | ORAL | Status: DC | PRN
  Administered 2019-05-22 – 2019-06-19 (×3): 2 mg via ORAL
  Filled 2019-05-22 (×4): qty 1

## 2019-05-22 MED ORDER — MAGNESIUM SULFATE IN D5W 1-5 GM/100ML-% IV SOLN
1.0000 g | Freq: Once | INTRAVENOUS | Status: AC
  Administered 2019-05-22: 1 g via INTRAVENOUS
  Filled 2019-05-22: qty 100

## 2019-05-22 NOTE — Progress Notes (Addendum)
[facility] for Infectious Disease    Date of Admission:  05/15/2019   Total days of antibiotics 7 unasyn/day 4 RIPE   ID: BRETON BERNS is a 73 y.o. male with  Cavitary pneumonia in setting of AFB+ smears concern for mTB Principal Problem:   Aspiration pneumonia (HCC) Active Problems:   Essential hypertension   CKD (chronic kidney disease) stage 3, GFR 30-59 ml/min (HCC)   Hyperlipidemia   Tobacco use disorder   Controlled type 2 diabetes mellitus with stage 3 chronic kidney disease, without long-term current use of insulin (HCC)   Weight loss   Hyperlipidemia associated with type 2 diabetes mellitus ([place])   Cavitary lesion of lung   Hypokalemia   Cavitary pneumonia   Anemia    Subjective: Afebrile but complaining of indigestion, and abdominal distention. He mentions that he had good oral intake today  Medications:  . buPROPion  150 mg Oral Daily  . enoxaparin (LOVENOX) injection  40 mg Subcutaneous Q24H  . ethambutol  800 mg Oral Daily  . feeding supplement (ENSURE ENLIVE)  237 mL Oral BID BM  . ipratropium  2 puff Inhalation TID  . isoniazid  300 mg Oral Daily   And  . vitamin B-6  50 mg Oral Daily  . levalbuterol  2 puff Inhalation TID  . mouth rinse  15 mL Mouth Rinse BID  . mometasone-formoterol  2 puff Inhalation BID  . multivitamin with minerals  1 tablet Oral Daily  . pravastatin  20 mg Oral q1800  . pyrazinamide  1,000 mg Oral Daily  . rifampin  600 mg Oral Daily  . thiamine  100 mg Oral Daily    Objective: Vital signs in last 24 hours: Temp:  [97.5 F (36.4 C)-97.6 F (36.4 C)] 97.6 F (36.4 C) (08/17 1539) Pulse Rate:  [71-114] 79 (08/17 1600) Resp:  [13-24] 19 (08/17 1600) BP: (131-160)/(59-83) 157/74 (08/17 1600) SpO2:  [76 %-99 %] 97 % (08/17 1600) Weight:  [57 kg] 57 kg (08/17 1626) Physical Exam  Constitutional: He is oriented to person, place, and time. He appears chronically ill and under-nourished. No distress.  HENT:   Mouth/Throat: Oropharynx is clear and moist. No oropharyngeal exudate.  Cardiovascular: Normal rate, regular rhythm and normal heart sounds. Exam reveals no gallop and no friction rub.  No murmur heard.  Pulmonary/Chest: Effort normal and breath sounds normal. No respiratory distress. He has no wheezes.  Abdominal: Soft. Bowel sounds are normal. He exhibits no distension. There is no tenderness.  Lymphadenopathy:  He has no cervical adenopathy.  Neurological: He is alert and oriented to person, place, and time.  Skin: Skin is warm and dry. No rash noted. No erythema.  Psychiatric: He has a normal mood and affect. His behavior is normal.       Lab Results Recent Labs    05/21/19 0456 05/22/19 0153  NA 146* 144  K 2.6* 2.9*  CL 105 103  CO2 31 29  BUN 39* 41*  CREATININE 1.63* 1.62*    Microbiology: reviewed Studies/Results: No results found.   Assessment/Plan: Bacterial pneumonia = today will be last day of unasyn  Presumed mTB vs. NTM infection = continue on RIPE plus vitB6. Will call lab tomorrow to check on molecular probe from mTB as well as call health department so that they can manage DOT.(direct observed therapy)  Hypokalemia = unclear if having refeeding syndrome. Recommend repeating and supplementing as needed. Would also check magnesium to see if stores  are low  GERD = recommend omeprazole PRN to see if that helps him [facility] for Infectious Diseases Cell: [phone] Pager: [phone]  05/22/2019, 4:51 PM

## 2019-05-22 NOTE — Progress Notes (Addendum)
PROGRESS NOTE    Clarence LaosRobert M Goodenow  ZOX:[id number]  DOB: 1945-11-03  DOA: 05/15/2019 PCP: Kermit Baloeed, Tiffany L, DO  Brief Narrative:   73 y.o.malewith medical history significant ofcardiomegaly, stroke, COPD, CKD stage III, tobacco abuse, DM 2, HTN who presented to the emergency room on 8/10 with progressive weakness over the last 3 months to the point where he was falling over. Called EMS on night of 8/10 after falling again. No fevers or headaches. Does report decreased appetite and has lost approximately 43 pounds in the past 2 months.. Denied any history of TB or exposure to TB. In the emergency room, COVID test negative. CT scan of chest noted multiple cavitary lesions of unclear etiology with a differential diagnosis of malignancy versus TB versus atypical infection. Please admitted to the hospital service and placed on isolation precautions. AFB sputum was ordered. QuantiFERON-TB gold also ordered.  On 8/11,patient noted to have increased heart rate and respiratory rate. Negative lactic acid level. Seen by speech therapy who found signs of significant dysphagia and patient placed on restricted diet. Seen by pulmonary who started the patient on IV antibiotics.Conversational dyspnea noted on exam on 8/14.  Increase oxygen demand.  ABG ordered and showed significant hypoxia with PaO2 57 and PCO2 45 and follow-up chest x-ray was ordered.   Subjective:  Patient states his appetite is improving.  He denies any chest pain or shortness of breath.  Still requiring supplemental O2.  Denies any ongoing cough or hemoptysis.  Objective: Vitals:   05/22/19 1223 05/22/19 1539 05/22/19 1600 05/22/19 1626  BP:   (!) 157/74   Pulse:   79   Resp:   19   Temp: 97.6 F (36.4 C) 97.6 F (36.4 C)    TempSrc: Oral Oral    SpO2:   97%   Weight:    57 kg  Height:        Intake/Output Summary (Last 24 hours) at 05/22/2019 1934 Last data filed at 05/22/2019 1600 Gross per 24 hour  Intake  1340.31 ml  Output 2453 ml  Net -1112.69 ml   Filed Weights   05/16/19 0629 05/21/19 0500 05/22/19 1626  Weight: 54 kg 65.8 kg 57 kg    Physical Examination:  General exam: Appears calm and comfortable  Respiratory system: Clear to auscultation. Respiratory effort normal.  Decreased breath sounds at bases Cardiovascular system: S1 & S2 heard, RRR. No JVD, murmurs, rubs, gallops or clicks. No pedal edema. Gastrointestinal system: Abdomen is nondistended, soft and nontender. No organomegaly or masses felt. Normal bowel sounds heard. Central nervous system: Alert and oriented. No focal neurological deficits. Extremities: Symmetric 5 x 5 power. Skin: No rashes, lesions or ulcers Psychiatry: Judgement and insight appear normal. Mood & affect appropriate.     Data Reviewed: I have personally reviewed following labs and imaging studies  CBC: Recent Labs  Lab 05/16/19 0426 05/17/19 0441 05/18/19 0353 05/19/19 0713  WBC 7.8 7.0 5.7 6.7  HGB 9.2* 10.0* 9.4* 9.7*  HCT 30.9* 34.8* 33.5* 34.5*  MCV 77.3* 78.4* 79.8* 81.0  PLT 215 217 236 212   Basic Metabolic Panel: Recent Labs  Lab 05/16/19 0107  05/16/19 0426 05/17/19 0441 05/18/19 0353 05/19/19 0713 05/21/19 0456 05/22/19 0153  NA  --    < > 132* 138 142 144 146* 144  K  --    < > 3.4* 3.5 3.2* 3.5 2.6* 2.9*  CL  --    < > 100 107 111 113* 105 103  CO2  --    < >  24 21* 24 23 31 29   GLUCOSE  --    < > 104* 131* 107* 175* 76 86  BUN  --    < > 23 23 29* 29* 39* 41*  CREATININE  --    < > 1.34* 1.45* 1.45* 1.42* 1.63* 1.62*  CALCIUM  --    < > 7.7* 8.2* 8.4* 8.7* 8.9 8.5*  MG  --   --  2.0  --   --   --   --  1.9  PHOS 2.4*  --  2.3*  --   --   --   --   --    < > = values in this interval not displayed.   GFR: Estimated Creatinine Clearance: 33.2 mL/min (A) (by C-G formula based on SCr of 1.62 mg/dL (H)). Liver Function Tests: Recent Labs  Lab 05/16/19 0426  AST 44*  ALT 26  ALKPHOS 102  BILITOT 0.8  PROT  5.6*  ALBUMIN 1.9*   No results for input(s): LIPASE, AMYLASE in the last 168 hours. No results for input(s): AMMONIA in the last 168 hours. Coagulation Profile: No results for input(s): INR, PROTIME in the last 168 hours. Cardiac Enzymes: Recent Labs  Lab 05/16/19 0107  CKTOTAL 45*   BNP (last 3 results) No results for input(s): PROBNP in the last 8760 hours. HbA1C: No results for input(s): HGBA1C in the last 72 hours. CBG: Recent Labs  Lab 05/22/19 0751 05/22/19 0813 05/22/19 0942 05/22/19 1208 05/22/19 1622  GLUCAP 64* 64* 110* 88 88   Lipid Profile: No results for input(s): CHOL, HDL, LDLCALC, TRIG, CHOLHDL, LDLDIRECT in the last 72 hours. Thyroid Function Tests: No results for input(s): TSH, T4TOTAL, FREET4, T3FREE, THYROIDAB in the last 72 hours. Anemia Panel: No results for input(s): VITAMINB12, FOLATE, FERRITIN, TIBC, IRON, RETICCTPCT in the last 72 hours. Sepsis Labs: Recent Labs  Lab 05/16/19 1038 05/16/19 1323 05/17/19 0441 05/17/19 0829 05/18/19 0353 05/19/19 0713  PROCALCITON 0.69  --  0.76  --  2.29 1.70  LATICACIDVEN  --  1.6  --  1.3  --  1.6    Recent Results (from the past 240 hour(s))  SARS Coronavirus 2 [facility](Hospital order, Performed in [facility] lab) Nasopharyngeal Nasopharyngeal Swab     Status: None   Collection Time: 05/15/19  5:03 PM   Specimen: Nasopharyngeal Swab  Result Value Ref Range Status   SARS Coronavirus 2 NEGATIVE NEGATIVE Final    Comment: (NOTE) If result is NEGATIVE SARS-CoV-2 target nucleic acids are NOT DETECTED. The SARS-CoV-2 RNA is generally detectable in upper and lower  respiratory specimens during the acute phase of infection. The lowest  concentration of SARS-CoV-2 viral copies this assay can detect is 250  copies / mL. A negative result does not preclude SARS-CoV-2 infection  and should not be used as the sole basis for treatment or other  patient management decisions.  A negative result may occur with   improper specimen collection / handling, submission of specimen other  than nasopharyngeal swab, presence of viral mutation(s) within the  areas targeted by this assay, and inadequate number of viral copies  (<250 copies / mL). A negative result must be combined with clinical  observations, patient history, and epidemiological information. If result is POSITIVE SARS-CoV-2 target nucleic acids are DETECTED. The SARS-CoV-2 RNA is generally detectable in upper and lower  respiratory specimens dur ing the acute phase of infection.  Positive  results are indicative of active infection with SARS-CoV-2.  Clinical  correlation with patient history and other diagnostic information is  necessary to determine patient infection status.  Positive results do  not rule out bacterial infection or co-infection with other viruses. If result is PRESUMPTIVE POSTIVE SARS-CoV-2 nucleic acids MAY BE PRESENT.   A presumptive positive result was obtained on the submitted specimen  and confirmed on repeat testing.  While 2019 novel coronavirus  (SARS-CoV-2) nucleic acids may be present in the submitted sample  additional confirmatory testing may be necessary for epidemiological  and / or clinical management purposes  to differentiate between  SARS-CoV-2 and other Sarbecovirus currently known to infect humans.  If clinically indicated additional testing with an alternate test  methodology [phone]([id number]) is advised. The SARS-CoV-2 RNA is generally  detectable in upper and lower respiratory sp ecimens during the acute  phase of infection. The expected result is Negative. Fact Sheet for Patients:  BoilerBrush.com.cyhttps://www.fda.gov/media/136312/download Fact Sheet for Healthcare Providers: https://pope.com/https://www.fda.gov/media/136313/download This test is not yet approved or cleared by the [place] FDA and has been authorized for detection and/or diagnosis of SARS-CoV-2 by FDA under an Emergency Use Authorization (EUA).  This EUA will  remain in effect (meaning this test can be used) for the duration of the COVID-19 declaration under Section 564(b)(1) of the Act, 21 U.S.C. section 360bbb-3(b)(1), unless the authorization is terminated or revoked sooner. Performed at [facility], [address] [place], [place] [address]   Culture, blood (Routine X 2) w Reflex to ID Panel     Status: None   Collection Time: 05/16/19  1:07 AM   Specimen: BLOOD  Result Value Ref Range Status   Specimen Description   Final    BLOOD LEFT ANTECUBITAL Performed at [facility], [address] [place], [place] [address]    Special Requests   Final    BOTTLES DRAWN AEROBIC AND ANAEROBIC Blood Culture adequate volume Performed at [facility], [address] [place], [place] [address]    Culture   Final    NO GROWTH 5 DAYS Performed at [facility], [address] [place], [place] [address]    Report Status 05/21/2019 FINAL  Final  Culture, blood (Routine X 2) w Reflex to ID Panel     Status: None   Collection Time: 05/16/19  1:07 AM   Specimen: BLOOD  Result Value Ref Range Status   Specimen Description   Final    BLOOD RIGHT ANTECUBITAL Performed at [facility], [address] [place], [place] [address]    Special Requests   Final    BOTTLES DRAWN AEROBIC AND ANAEROBIC Blood Culture results may not be optimal due to an excessive volume of blood received in culture bottles Performed at [facility], [address] [place], [place] [address]    Culture   Final    NO GROWTH 5 DAYS Performed at [facility], [address] [place], [place] [address]    Report Status 05/21/2019 FINAL  Final  Acid Fast Smear (AFB)     Status: Abnormal   Collection Time: 05/16/19  2:16 AM   Specimen: Sputum  Result Value Ref Range Status   AFB Specimen Processing Concentration  Final   Acid Fast Smear Positive (A)  Final    Comment:  (NOTE) 2+, 4-36 acid-fast bacilli per 10 fields at 400X magnification, fluorescent smear NOTIFIED KATHLEEN ON 05/17/19 AT 15:17 DS FAXED TO [phone] Performed At: [facility] [address]  [address] [place], [place] [address] Jolene Schimke MD WU:[id number]    Source (AFB) SPUTUM  Final    Comment: Performed at [facility], [address], [place], [place] [address]  Acid Fast Smear (AFB)     Status: None   Collection Time: 05/16/19 12:07 PM   Specimen: Sputum  Result Value Ref Range Status   AFB Specimen Processing Concentration  Final   Acid Fast Smear Positive  Final    Comment: CRITICAL RESULT CALLED TO, READ BACK BY AND VERIFIED WITH: RITA,SAKAYI AT 0350 ON 05/19/19 BY A,MOHAMED (NOTE) 3+, 4-36 acid-fast bacilli per field at 400X magnification, fluorescent smear NOTIFIED KATHLEEN ON 05/17/19 AT 15:17 DS FAXED TO [phone] Performed At: [facility] [address] [place], [place] [address] Jolene Schimke MD KG:[id number]    Source (AFB) SPUTUM  Final    Comment: Performed at [facility], [address], [place], [place] [address]  MRSA PCR Screening     Status: None   Collection Time: 05/16/19 12:07 PM   Specimen: Nasal Mucosa; Nasopharyngeal  Result Value Ref Range Status   MRSA by PCR NEGATIVE NEGATIVE Final    Comment:        The GeneXpert MRSA Assay (FDA approved for NASAL specimens only), is one component of a comprehensive MRSA colonization surveillance program. It is not intended to diagnose MRSA infection nor to guide or monitor treatment for MRSA infections. Performed at [facility], [address], [place], [place] [address]   Expectorated sputum assessment w rflx to resp cult     Status: None   Collection Time: 05/16/19 12:46 PM   Specimen: SPU  Result Value Ref Range Status   Specimen Description SPUTUM  Final   Special Requests NONE  Final   Sputum evaluation   Final     THIS SPECIMEN IS ACCEPTABLE FOR SPUTUM CULTURE Performed at [facility], [address], [place], [place] [address]    Report Status 05/16/2019 FINAL  Final  Culture, respiratory     Status: None   Collection Time: 05/16/19 12:46 PM   Specimen: SPU  Result Value Ref Range Status   Specimen Description   Final    SPUTUM Performed at [facility], [address], [place], [place] [address]    Special Requests   Final    NONE Reflexed from [phone] Performed at [facility], [address], [place], [place] [address]    Gram Stain   Final    MODERATE WBC PRESENT,BOTH PMN AND MONONUCLEAR MODERATE GRAM POSITIVE COCCI FEW GRAM VARIABLE ROD    Culture   Final    FEW Consistent with normal respiratory flora. Performed at [facility], [address] [place], [place] [address]    Report Status 05/18/2019 FINAL  Final  Acid Fast Smear (AFB)     Status: Abnormal   Collection Time: 05/17/19  8:08 PM   Specimen: Sputum  Result Value Ref Range Status   AFB Specimen Processing Concentration  Final   Acid Fast Smear Positive (A)  Final    Comment: (NOTE) 3+, 4-36 acid-fast bacilli per field at 400X magnification, fluorescent smear Positive smear reported to Aisha M. on 05/19/19 at 3:34 A.M by BMS. Performed At: [facility] [address] [place], [place] [id number] Jolene Schimke MD FT:[id number]    Source (AFB) SPUTUM  Final    Comment: Performed at [facility], [address] [place], [place]  [address]      Radiology Studies: No results found.      Scheduled Meds: . buPROPion  150 mg Oral Daily  . Chlorhexidine Gluconate Cloth  6 each Topical Daily  . enoxaparin (LOVENOX) injection  40 mg Subcutaneous Q24H  . ethambutol  800 mg Oral Daily  . feeding supplement (ENSURE ENLIVE)  237 mL Oral BID BM  . ipratropium  2 puff Inhalation TID  . isoniazid  300 mg Oral Daily   And  .  vitamin B-6  50 mg Oral Daily  . levalbuterol  2 puff Inhalation TID  . mouth rinse  15 mL Mouth Rinse BID  . mometasone-formoterol  2 puff Inhalation BID  . multivitamin with minerals  1 tablet Oral Daily  . pantoprazole  40 mg Oral Q0600  . pravastatin  20 mg Oral q1800  . pyrazinamide  1,000 mg Oral Daily  . rifampin  600 mg Oral Daily  . thiamine  100 mg Oral Daily   Continuous Infusions: . sodium chloride 10 mL/hr at 05/22/19 0400  . ampicillin-sulbactam (UNASYN) IV Stopped (05/22/19 1451)    Assessment & Plan:    1.Acute hypoxic respiratory failure likely secondary to bilateral cavitary pneumonia, with concern for TB complicated by suspected aspiration pneumonia.  Appreciate ID input, patient initiated on rifampin, isoniazid ethambutol, pyrazinamide therapy.  On multivitamin/thiamine. Patient moved to stepdown unit, high level of care, due to increased work of breathing on 05/19/2019 and ABG showing hypoxia requiring  100% NRB M transiently.  Seen by pulmonary and started on antibiotics for aspiration pneumonia.  Remains in negative pressure isolation and droplet precautions.  ID to contact health department and arrange direct observation therapy. Taper O2 as tolerated.  2.  Aspiration pneumonia: On IV antibiotics as discussed above.  Seen by speech therapy and started on dysphagia diet (pured) with honey thick liquids.  Nurse reports semi-formed/loose stools likely related to diet.  Loperamide as needed  3. Questionable mass in left atrium on 2D echo 05/16/19 Could not r/o vegetation on 2D echo 05/16/19 :Severe thickening of the mitral valve leaflet. Severe calcification of the mitral valve leaflet. There is severe mitral annular calcification present. Severe and bulky posterior annular calcification cannot r/o vegetation.Moderate thickening of the aortic valve. .Subcostal images suggest mobile mass in RA Suggest TEE if clinically indicated. Cardiology consulted , pending evaluation,  will f/u in am  4.  NSVT: In the setting of electrolyte imbalances.  Replace potassium/magnesium.  Echo as above with preserved EF.  5, CKD 3: Appears to be close to his baseline with creatinine of 1.42 and GFR of 49 Creatinine 1.63 on 05/21/2019 . Off IV Lasix,  avoid nephrotoxins  6. Type 2 diabetes, complicated by hypoglycemia in the setting of recent weight loss, poor oral intake and CKD : Hemoglobin A1c 6.3, sliding scale insulin held after hypoglycemic event Encourage oral intake and avoid hypoglycemia  7. Recurrent hypokalemia: Potassium levels have been less than 3 and received several po/ IV supplements. Nl mag/phosphorus. Check  Potassium level today and  Replace as needed  8. Unintentional weight loss: In the setting of problem #1. Dietitian following.  Appetite improving Continue oral supplement   9. Tobacco use disorder :Nicotine patch as needed  10. GERD : started on protonix by ID   DVT prophylaxis: Lovenox Code Status: Full code Family / Patient Communication: Discussed with patient and answered all questions to satisfaction. Disposition Plan: PT recommended SNF CSW consulted for possible SNF placement, might be difficult  with current clinical condition.      LOS: 7 days    Time spent: 35 minutes    Guilford Shi, MD [facility] Pager [phone]  If 7PM-7AM, please contact night-coverage [email or web address] Password [facility] 05/22/2019, 7:34 PM

## 2019-05-22 NOTE — Progress Notes (Signed)
Physical Therapy Treatment Patient Details Name: Clarence Maldonado MRN: [id number] DOB: September 19, 1946 Today's Date: 05/22/2019    History of Present Illness 73 y.o. male with medical history significant of cardiomegaly, stroke, COPD,   CKD stage III, tobacco abuse, DM 2, HTN who presented to the emergency room on 8/10 with progressive weakness over the last 3 months to the point where he was falling over. He has lost approximately 43 pounds in the past 2 months. CT scan of chest noted multiple cavitary lesions of unclear etiology with a differential diagnosis of malignancy versus TB versus atypical infection.    PT Comments    Pt is weak and fatigues easily. He is at risk for falls when mobilizing. Remained on  O2 during session-sats >90%. Will continue to follow and progress activity as tolerated.    Follow Up Recommendations  SNF     Equipment Recommendations  None recommended by PT    Recommendations for Other Services       Precautions / Restrictions Precautions Precautions: Fall Precaution Comments: reports 4 falls recently, and fell in hospital also Restrictions Weight Bearing Restrictions: No    Mobility  Bed Mobility Overal bed mobility: Needs Assistance Bed Mobility: Supine to Sit;Sit to Supine     Supine to sit: Min guard Sit to supine: Min assist   General bed mobility comments: Assist for LEs. Increased time. Cues for safety, technique.  Transfers Overall transfer level: Needs assistance Equipment used: 1 person hand held assist Transfers: Sit to/from Stand Sit to Stand: Mod assist         General transfer comment: Assist to rise, stabilize, control descent. VCs safety, technique, hand placement. Increased time. LE instability/weakness noted. Pt stood ~20 seconds x 2  Ambulation/Gait Mod Assist; +2 safety/equipment           General Gait Details: Side steps along side of bed. Assist to stabilize. Pt held onto therapist's hand and bedrail. Fatigues  easily. High fall risk.   Stairs             Wheelchair Mobility    Modified Rankin (Stroke Patients Only)       Balance Overall balance assessment: Needs assistance           Standing balance-Leahy Scale: Poor                              Cognition Arousal/Alertness: Awake/alert Behavior During Therapy: WFL for tasks assessed/performed Overall Cognitive Status: Within Functional Limits for tasks assessed                                        Exercises      General Comments        Pertinent Vitals/Pain Pain Assessment: No/denies pain    Home Living                      Prior Function            PT Goals (current goals can now be found in the care plan section) Progress towards PT goals: Progressing toward goals    Frequency    Min 2X/week      PT Plan Current plan remains appropriate    Co-evaluation              AM-PAC PT "6 Clicks" Mobility  Outcome Measure  Help needed turning from your back to your side while in a flat bed without using bedrails?: A Little Help needed moving from lying on your back to sitting on the side of a flat bed without using bedrails?: A Little Help needed moving to and from a bed to a chair (including a wheelchair)?: A Lot Help needed standing up from a chair using your arms (e.g., wheelchair or bedside chair)?: A Lot Help needed to walk in hospital room?: Total Help needed climbing 3-5 steps with a railing? : Total 6 Click Score: 12    End of Session Equipment Utilized During Treatment: Gait belt Activity Tolerance: Patient limited by fatigue Patient left: in bed;with call bell/phone within reach;with bed alarm set;with nursing/sitter in room   PT Visit Diagnosis: Unsteadiness on feet (R26.81);Muscle weakness (generalized) (M62.81);Difficulty in walking, not elsewhere classified (R26.2);Adult, failure to thrive (R62.7);History of falling (Z91.81);Repeated falls  (R29.6)     Time: 5462-7035 PT Time Calculation (min) (ACUTE ONLY): 26 min  Charges:  $Therapeutic Activity: 23-37 mins                        Weston Anna, PT Acute Rehabilitation Services Pager: [phone] Office: [phone]

## 2019-05-22 NOTE — Progress Notes (Signed)
[facility] Progress Note Patient Name: LUISDANIEL KENTON DOB: July 14, 1946 MRN: [id number]   Date of Service  05/22/2019  HPI/Events of Note  K and mag noted  eICU Interventions  40 meq IV kcl, 1 gram IV mag ordered Given the creatinine of 1.6, will start with this dose and suggest repeating and deciding additional doses today     Intervention Category Major Interventions: Electrolyte abnormality - evaluation and management  [name] 05/22/2019, 6:37 AM

## 2019-05-22 NOTE — Plan of Care (Signed)
NAAT for TB added to AFB cultures at [facility] to facilitate rapid identification of TB if present.   Julian Hy, DO 05/22/19 9:24 AM [facility]

## 2019-05-22 NOTE — Progress Notes (Signed)
SLP Cancellation Note  Patient Details Name: RICHIE BONANNO MRN: [id number] DOB: Nov 15, 1945   Cancelled treatment:       Reason Eval/Treat Not Completed: Other (comment)   Will await for TB identification for MBS per PCCM notes.  Recommend continue strict aspiration precautions.    Luanna Salk, MS [facility] SLP Acute Rehab Services Pager [phone] Office [phone]   [name] 05/22/2019, 10:32 AM

## 2019-05-23 LAB — BASIC METABOLIC PANEL
Anion gap: 9 (ref 5–15)
BUN: 48 mg/dL — ABNORMAL HIGH (ref 8–23)
CO2: 30 mmol/L (ref 22–32)
Calcium: 8.7 mg/dL — ABNORMAL LOW (ref 8.9–10.3)
Chloride: 103 mmol/L (ref 98–111)
Creatinine, Ser: 1.97 mg/dL — ABNORMAL HIGH (ref 0.61–1.24)
GFR calc Af Amer: 38 mL/min — ABNORMAL LOW (ref >60)
GFR calc non Af Amer: 33 mL/min — ABNORMAL LOW (ref >60)
Glucose, Bld: 132 mg/dL — ABNORMAL HIGH (ref 70–99)
Potassium: 3.1 mmol/L — ABNORMAL LOW (ref 3.5–5.1)
Sodium: 142 mmol/L (ref 135–145)

## 2019-05-23 LAB — GLUCOSE, CAPILLARY
Glucose-Capillary: 103 mg/dL — ABNORMAL HIGH (ref 70–99)
Glucose-Capillary: 73 mg/dL (ref 70–99)
Glucose-Capillary: 74 mg/dL (ref 70–99)
Glucose-Capillary: 78 mg/dL (ref 70–99)
Glucose-Capillary: 83 mg/dL (ref 70–99)
Glucose-Capillary: 91 mg/dL (ref 70–99)

## 2019-05-23 LAB — MAGNESIUM: Magnesium: 2.2 mg/dL (ref 1.7–2.4)

## 2019-05-23 LAB — PHOSPHORUS: Phosphorus: 4 mg/dL (ref 2.5–4.6)

## 2019-05-23 MED ORDER — ETHAMBUTOL HCL 400 MG PO TABS
1200.0000 mg | ORAL_TABLET | Freq: Every day | ORAL | Status: DC
  Administered 2019-05-24 – 2019-06-02 (×10): 1200 mg via ORAL
  Filled 2019-05-23 (×10): qty 3

## 2019-05-23 MED ORDER — PYRAZINAMIDE 500 MG PO TABS
1500.0000 mg | ORAL_TABLET | Freq: Every day | ORAL | Status: DC
  Administered 2019-05-24 – 2019-06-02 (×10): 1500 mg via ORAL
  Filled 2019-05-23 (×11): qty 3

## 2019-05-23 MED ORDER — ENSURE ENLIVE PO LIQD
237.0000 mL | ORAL | Status: DC
  Administered 2019-05-26 – 2019-07-25 (×56): 237 mL via ORAL

## 2019-05-23 NOTE — Progress Notes (Signed)
SLP continues to await medical clearance for pt to have MBS.  Recommend MBS as soon as pt is able to be lifted from TB airborne precautions.   Luanna Salk, [facility] SLP Acute Rehab Services Pager [phone] Office [phone]

## 2019-05-23 NOTE — Progress Notes (Signed)
ID PROGRESS NOTE  Afebrile  Awaiting MTB Probe on sputum sample to be back by tomorrow Will let health department know we have started mTB treatment on patient Have adjusted dosing based on current weight  Will provide more recs tomorrow  Caren Griffins B. [facility] Infectious [facility] [phone]

## 2019-05-23 NOTE — Progress Notes (Signed)
Received pt from ICU, tele placed and verified, skin assessed, VS completed. Pt alert, memory impairment noted. SRP, RN

## 2019-05-23 NOTE — Progress Notes (Signed)
Occupational Therapy Treatment Patient Details Name: Clarence Maldonado MRN: [id number] DOB: 1946-02-23 Today's Date: 05/23/2019    History of present illness 73 y.o. male with medical history significant of cardiomegaly, stroke, COPD,   CKD stage III, tobacco abuse, DM 2, HTN who presented to the emergency room on 8/10 with progressive weakness over the last 3 months to the point where he was falling over. He has lost approximately 43 pounds in the past 2 months. CT scan of chest noted multiple cavitary lesions of unclear etiology with a differential diagnosis of malignancy versus TB versus atypical infection.   OT comments  Pt pleased to be up in chair for breakfast.  Followed cues well and not impulsive during tx. Reoriented to call bell before I left as nursing reported a fall at 3 am.  Min A for bed mobility and transfer.  Intermittent supervision for self feeding.  Pt was eating at a slow rate when I left  Follow Up Recommendations  SNF    Equipment Recommendations  3 in 1 bedside commode    Recommendations for Other Services      Precautions / Restrictions Precautions Precautions: Fall Precaution Comments: multiple falls Restrictions Weight Bearing Restrictions: No       Mobility Bed Mobility         Supine to sit: Min assist     General bed mobility comments: light assistance for trunk  Transfers   Equipment used: Rolling walker (2 wheeled)   Sit to Stand: Min assist Stand pivot transfers: Min assist       General transfer comment: min A to stand and steady. Steadying assistance and assist to turn RW to get to chair    Balance     Sitting balance-Leahy Scale: Fair       Standing balance-Leahy Scale: Poor                             ADL either performed or assessed with clinical judgement   ADL   Eating/Feeding: Supervision/ safety(intermittent)                       Toilet Transfer: Minimal assistance;Stand-pivot;RW(chair)                    Vision       Perception     Praxis      Cognition Arousal/Alertness: Awake/alert Behavior During Therapy: WFL for tasks assessed/performed                                   General Comments: per nursing, fell at 3 am.  Reoriented to call bell for assistance        Exercises     Shoulder Instructions       General Comments      Pertinent Vitals/ Pain       Pain Assessment: No/denies pain  Home Living                                          Prior Functioning/Environment              Frequency  Min 2X/week        Progress Toward Goals  OT Goals(current goals can now be found in  the care plan section)  Progress towards OT goals: Progressing toward goals     Plan      Co-evaluation                 AM-PAC OT "6 Clicks" Daily Activity     Outcome Measure   Help from another person eating meals?: A Little Help from another person taking care of personal grooming?: A Little Help from another person toileting, which includes using toliet, bedpan, or urinal?: Total Help from another person bathing (including washing, rinsing, drying)?: A Lot Help from another person to put on and taking off regular upper body clothing?: A Lot Help from another person to put on and taking off regular lower body clothing?: A Lot 6 Click Score: 13    End of Session    OT Visit Diagnosis: Unsteadiness on feet (R26.81)   Activity Tolerance Patient limited by fatigue   Patient Left in chair;with call bell/phone within reach;with chair alarm set   Nurse Communication          Time: [phone] OT Time Calculation (min): 26 min  Charges: OT General Charges $OT Visit: 1 Visit OT Treatments $Therapeutic Activity: 23-37 mins  Clarence Maldonado, OTR/L Acute Rehabilitation Services [phone] [facility] pager [phone] office 05/23/2019   Clarence Maldonado 05/23/2019, 10:28 AM

## 2019-05-23 NOTE — Progress Notes (Signed)
Nutrition Follow-up  RD working remotely.   DOCUMENTATION CODES:   Not applicable  INTERVENTION:  - will decrease Ensure Enlive from BID to once/day, each supplement provides 350 kcal and 20 grams protein.  - will order Magic Cup BID with meals, each supplement provides 290 kcal and 9 grams of protein. - continue to encourage PO intakes.    NUTRITION DIAGNOSIS:   Inadequate oral intake related to poor appetite as evidenced by per patient/family report. -ongoing, improving  GOAL:   Patient will meet greater than or equal to 90% of their needs -unmet on average  MONITOR:   PO intake, Supplement acceptance, Labs, Weight trends, I & O's  ASSESSMENT:   73 y.o. male with medical history significant of cardiomegaly, stroke, COPD,   CKD stage III, tobacco abuse, DM 2, HTN,  Admitted for cavitary lesions unclear etiology work-up for TB versus malignancy  Weight has been fluctuating since admission (8/10) with current weight +28 lb compared to admission weight; will need to continue to monitor this closely. Per review of RN flow sheet, patient consumed 0% of all meals on 8/11; 100% of breakfast and 25% of lunch on 8/12; 25% of breakfast and 75% of lunch on 8/15; 50% of lunch on 8/16. He has accepted Ensure ~25% of the time offered.   Per notes: - acute hypoxic respiratory failure thought to be 2/2 PNA with concern for TB--ID to contact Health Department - aspiration PNA--SLP following - questionable mass in L atrium with Cardiology consulted - unintentional weight loss    Labs reviewed; CBGs: 73, 78, and 74 mg/dl today, Phos and Mg WDL. Medications reviewed; 50 mg vitamin B6/day, PRN imodium, 1 g IV Mg sulfate x1 run 8/17, daily multivitamin with minerals, 40 mg oral protonix/day, 10 mEq IV KCl x4 runs 8/17, 100 mg oral thiamine/day.     Diet Order:   Diet Order            DIET - DYS 1 Room service appropriate? Yes with Assist; Fluid consistency: Nectar Thick  Diet effective now               EDUCATION NEEDS:   No education needs have been identified at this time  Skin:  Skin Assessment: Reviewed RN Assessment  Last BM:  8/18  Height:   Ht Readings from Last 1 Encounters:  05/16/19 5\' 6"  (1.676 m)    Weight:   Wt Readings from Last 1 Encounters:  05/23/19 66.5 kg    Ideal Body Weight:  64.5 kg  BMI:  Body mass index is 23.66 kg/m.  Estimated Nutritional Needs:   Kcal:  1600-1800  Protein:  70-80g  Fluid:  1.8L/day     Jarome Matin, MS, RD, LDN, CNSC Inpatient Clinical Dietitian Pager # [phone] After hours/weekend pager # [phone]

## 2019-05-23 NOTE — Progress Notes (Signed)
PROGRESS NOTE    Clarence Maldonado  MCN:[id number]  DOB: 1946-04-25  DOA: 05/15/2019 PCP: [name], DO  Brief Narrative:   73 y.o.malewith medical history significant ofcardiomegaly, stroke, COPD, CKD stage III, tobacco abuse, DM 2, HTN who presented to the emergency room on 8/10 with progressive weakness over the last 3 months to the point where he was falling over. Called EMS on night of 8/10 after falling again. No fevers or headaches. Does report decreased appetite and has lost approximately 43 pounds in the past 2 months.. Denied any history of TB or exposure to TB. In the emergency room, COVID test negative. CT scan of chest noted multiple cavitary lesions of unclear etiology with a differential diagnosis of malignancy versus TB versus atypical infection. Please admitted to the hospital service and placed on isolation precautions. AFB sputum was ordered. QuantiFERON-TB gold also ordered.  On 8/11,patient noted to have increased heart rate and respiratory rate. Negative lactic acid level. Seen by speech therapy who found signs of significant dysphagia and patient placed on restricted diet. Seen by pulmonary who started the patient on IV antibiotics.Conversational dyspnea noted on exam on 8/14.  Increase oxygen demand.  ABG ordered and showed significant hypoxia with PaO2 57 and PCO2 45 and follow-up chest x-ray was ordered.   Subjective:  Patient states his appetite is improving.  He denies any chest pain or shortness of breath.  Still requiring supplemental O2.  Denies any ongoing cough or hemoptysis. Asking about going home   Objective: Vitals:   05/23/19 1146 05/23/19 1501 05/23/19 1503 05/23/19 1713  BP:  135/69  (!) 143/74  Pulse:  87  99  Resp:  20  18  Temp: 98 F (36.7 C) 97.8 F (36.6 C)  98.4 F (36.9 C)  TempSrc: Oral Oral  Oral  SpO2:  90%  90%  Weight:   63.4 kg   Height:        Intake/Output Summary (Last 24 hours) at 05/23/2019 1737  Last data filed at 05/23/2019 1717 Gross per 24 hour  Intake 327.61 ml  Output 1800 ml  Net -1472.39 ml   Filed Weights   05/22/19 1626 05/23/19 0334 05/23/19 1503  Weight: 57 kg 66.5 kg 63.4 kg    Physical Examination:  General exam: Appears calm and comfortable  Respiratory system: Clear to auscultation. Respiratory effort normal.  Decreased breath sounds at bases Cardiovascular system: S1 & S2 heard, RRR. No JVD, murmurs, rubs, gallops or clicks. No pedal edema. Gastrointestinal system: Abdomen is nondistended, soft and nontender. No organomegaly or masses felt. Normal bowel sounds heard. Central nervous system: Alert and oriented. No focal neurological deficits. Extremities: Symmetric 5 x 5 power. Skin: No rashes, lesions or ulcers Psychiatry: Judgement and insight appear normal. Mood & affect appropriate.     Data Reviewed: I have personally reviewed following labs and imaging studies  CBC: Recent Labs  Lab 05/17/19 0441 05/18/19 0353 05/19/19 0713  WBC 7.0 5.7 6.7  HGB 10.0* 9.4* 9.7*  HCT 34.8* 33.5* 34.5*  MCV 78.4* 79.8* 81.0  PLT 217 236 212   Basic Metabolic Panel: Recent Labs  Lab 05/17/19 0441 05/18/19 0353 05/19/19 0713 05/21/19 0456 05/22/19 0153 05/23/19 0209  NA 138 142 144 146* 144  --   K 3.5 3.2* 3.5 2.6* 2.9*  --   CL 107 111 113* 105 103  --   CO2 21* 24 23 31 29   --   GLUCOSE 131* 107* 175* 76 86  --  BUN 23 29* 29* 39* 41*  --   CREATININE 1.45* 1.45* 1.42* 1.63* 1.62*  --   CALCIUM 8.2* 8.4* 8.7* 8.9 8.5*  --   MG  --   --   --   --  1.9 2.2  PHOS  --   --   --   --   --  4.0   GFR: Estimated Creatinine Clearance: 37 mL/min (A) (by C-G formula based on SCr of 1.62 mg/dL (H)). Liver Function Tests: No results for input(s): AST, ALT, ALKPHOS, BILITOT, PROT, ALBUMIN in the last 168 hours. No results for input(s): LIPASE, AMYLASE in the last 168 hours. No results for input(s): AMMONIA in the last 168 hours. Coagulation Profile: No  results for input(s): INR, PROTIME in the last 168 hours. Cardiac Enzymes: No results for input(s): CKTOTAL, CKMB, CKMBINDEX, TROPONINI in the last 168 hours. BNP (last 3 results) No results for input(s): PROBNP in the last 8760 hours. HbA1C: No results for input(s): HGBA1C in the last 72 hours. CBG: Recent Labs  Lab 05/22/19 2039 05/23/19 0010 05/23/19 0322 05/23/19 0841 05/23/19 1145  GLUCAP 94 73 78 74 83   Lipid Profile: No results for input(s): CHOL, HDL, LDLCALC, TRIG, CHOLHDL, LDLDIRECT in the last 72 hours. Thyroid Function Tests: No results for input(s): TSH, T4TOTAL, FREET4, T3FREE, THYROIDAB in the last 72 hours. Anemia Panel: No results for input(s): VITAMINB12, FOLATE, FERRITIN, TIBC, IRON, RETICCTPCT in the last 72 hours. Sepsis Labs: Recent Labs  Lab 05/17/19 0441 05/17/19 0829 05/18/19 0353 05/19/19 0713  PROCALCITON 0.76  --  2.29 1.70  LATICACIDVEN  --  1.3  --  1.6    Recent Results (from the past 240 hour(s))  SARS Coronavirus 2 [facility](Hospital order, Performed in [facility] hospital lab) Nasopharyngeal Nasopharyngeal Swab     Status: None   Collection Time: 05/15/19  5:03 PM   Specimen: Nasopharyngeal Swab  Result Value Ref Range Status   SARS Coronavirus 2 NEGATIVE NEGATIVE Final    Comment: (NOTE) If result is NEGATIVE SARS-CoV-2 target nucleic acids are NOT DETECTED. The SARS-CoV-2 RNA is generally detectable in upper and lower  respiratory specimens during the acute phase of infection. The lowest  concentration of SARS-CoV-2 viral copies this assay can detect is 250  copies / mL. A negative result does not preclude SARS-CoV-2 infection  and should not be used as the sole basis for treatment or other  patient management decisions.  A negative result may occur with  improper specimen collection / handling, submission of specimen other  than nasopharyngeal swab, presence of viral mutation(s) within the  areas targeted by this assay, and inadequate  number of viral copies  (<250 copies / mL). A negative result must be combined with clinical  observations, patient history, and epidemiological information. If result is POSITIVE SARS-CoV-2 target nucleic acids are DETECTED. The SARS-CoV-2 RNA is generally detectable in upper and lower  respiratory specimens dur ing the acute phase of infection.  Positive  results are indicative of active infection with SARS-CoV-2.  Clinical  correlation with patient history and other diagnostic information is  necessary to determine patient infection status.  Positive results do  not rule out bacterial infection or co-infection with other viruses. If result is PRESUMPTIVE POSTIVE SARS-CoV-2 nucleic acids MAY BE PRESENT.   A presumptive positive result was obtained on the submitted specimen  and confirmed on repeat testing.  While 2019 novel coronavirus  (SARS-CoV-2) nucleic acids may be present in the submitted sample  additional confirmatory testing may be necessary for epidemiological  and / or clinical management purposes  to differentiate between  SARS-CoV-2 and other Sarbecovirus currently known to infect humans.  If clinically indicated additional testing with an alternate test  methodology [phone]([id number]) is advised. The SARS-CoV-2 RNA is generally  detectable in upper and lower respiratory sp ecimens during the acute  phase of infection. The expected result is Negative. Fact Sheet for Patients:  BoilerBrush.com.cyhttps://www.fda.gov/media/136312/download Fact Sheet for Healthcare Providers: https://pope.com/https://www.fda.gov/media/136313/download This test is not yet approved or cleared by the [place] FDA and has been authorized for detection and/or diagnosis of SARS-CoV-2 by FDA under an Emergency Use Authorization (EUA).  This EUA will remain in effect (meaning this test can be used) for the duration of the COVID-19 declaration under Section 564(b)(1) of the Act, 21 U.S.C. section 360bbb-3(b)(1), unless the  authorization is terminated or revoked sooner. Performed at [facility], [address] [place], [place] [address]   Culture, blood (Routine X 2) w Reflex to ID Panel     Status: None   Collection Time: 05/16/19  1:07 AM   Specimen: BLOOD  Result Value Ref Range Status   Specimen Description   Final    BLOOD LEFT ANTECUBITAL Performed at [facility], [address] [place], [place] [address]    Special Requests   Final    BOTTLES DRAWN AEROBIC AND ANAEROBIC Blood Culture adequate volume Performed at [facility], [address] [place], [place] [address]    Culture   Final    NO GROWTH 5 DAYS Performed at [facility], [address] [place], [place] [address]    Report Status 05/21/2019 FINAL  Final  Culture, blood (Routine X 2) w Reflex to ID Panel     Status: None   Collection Time: 05/16/19  1:07 AM   Specimen: BLOOD  Result Value Ref Range Status   Specimen Description   Final    BLOOD RIGHT ANTECUBITAL Performed at [facility], [address] [place], [place] [address]    Special Requests   Final    BOTTLES DRAWN AEROBIC AND ANAEROBIC Blood Culture results may not be optimal due to an excessive volume of blood received in culture bottles Performed at [facility], [address] [place], [place] [address]    Culture   Final    NO GROWTH 5 DAYS Performed at [facility], [address] [place], [place] [address]    Report Status 05/21/2019 FINAL  Final  Acid Fast Smear (AFB)     Status: Abnormal   Collection Time: 05/16/19  2:16 AM   Specimen: Sputum  Result Value Ref Range Status   AFB Specimen Processing Concentration  Final   Acid Fast Smear Positive (A)  Final    Comment: (NOTE) 2+, 4-36 acid-fast bacilli per 10 fields at 400X magnification, fluorescent smear NOTIFIED KATHLEEN ON 05/17/19 AT 15:17 DS FAXED TO [phone] Performed At: [facility] [address] [place], [place] [id number] Jolene SchimkeNagendra Sanjai MD [id number]:[phone]    Source (AFB) SPUTUM  Final    Comment: Performed at [facility], [address] [place], [place] [address]  Acid Fast Smear (AFB)     Status: None   Collection Time: 05/16/19 12:07 PM   Specimen: Sputum  Result Value Ref Range Status   AFB Specimen Processing Concentration  Final   Acid Fast Smear Positive  Final  Comment: CRITICAL RESULT CALLED TO, READ BACK BY AND VERIFIED WITH: RITA,SAKAYI AT 0350 ON 05/19/19 BY A,MOHAMED (NOTE) 3+, 4-36 acid-fast bacilli per field at 400X magnification, fluorescent smear NOTIFIED KATHLEEN ON 05/17/19 AT 15:17 DS FAXED TO [phone] Performed At: [facility] [address] [place], [place] [id number] Jolene SchimkeNagendra Sanjai MD [id number]:[phone]    Source (AFB) SPUTUM  Final    Comment: Performed at [facility], [address] [place], [place] [address]  MRSA PCR Screening     Status: None   Collection Time: 05/16/19 12:07 PM   Specimen: Nasal Mucosa; Nasopharyngeal  Result Value Ref Range Status   MRSA by PCR NEGATIVE NEGATIVE Final    Comment:        The GeneXpert MRSA Assay (FDA approved for NASAL specimens only), is one component of a comprehensive MRSA colonization surveillance program. It is not intended to diagnose MRSA infection nor to guide or monitor treatment for MRSA infections. Performed at [facility], [address] [place], [place] [address]   Expectorated sputum assessment w rflx to resp cult     Status: None   Collection Time: 05/16/19 12:46 PM   Specimen: SPU  Result Value Ref Range Status   Specimen Description SPUTUM  Final   Special Requests NONE  Final   Sputum evaluation   Final    THIS SPECIMEN IS ACCEPTABLE FOR SPUTUM CULTURE Performed at [facility], [address] [place], [place] [address]    Report Status 05/16/2019 FINAL   Final  Culture, respiratory     Status: None   Collection Time: 05/16/19 12:46 PM   Specimen: SPU  Result Value Ref Range Status   Specimen Description   Final    SPUTUM Performed at [facility], [address] [place], [place] [address]    Special Requests   Final    NONE Reflexed from [id number] Performed at [facility] [place] [facility], [address] [place], [place] [address]    Gram Stain   Final    MODERATE WBC PRESENT,BOTH PMN AND MONONUCLEAR MODERATE GRAM POSITIVE COCCI FEW GRAM VARIABLE ROD    Culture   Final    FEW Consistent with normal respiratory flora. Performed at [facility], [address] [place], [place] [address]    Report Status 05/18/2019 FINAL  Final  Acid Fast Smear (AFB)     Status: Abnormal   Collection Time: 05/17/19  8:08 PM   Specimen: Sputum  Result Value Ref Range Status   AFB Specimen Processing Concentration  Final   Acid Fast Smear Positive (A)  Final    Comment: (NOTE) 3+, 4-36 acid-fast bacilli per field at 400X magnification, fluorescent smear Positive smear reported to Aisha M. on 05/19/19 at 3:34 A.M by BMS. Performed At: [facility] [address] [place], [place] [id number] Jolene SchimkeNagendra Sanjai MD DD:[id number]:[phone]    Source (AFB) SPUTUM  Final    Comment: Performed at [facility], [address] [place], [place] [address]      Radiology Studies: No results found.      Scheduled Meds: . buPROPion  150 mg Oral Daily  . Chlorhexidine Gluconate Cloth  6 each Topical Daily  . enoxaparin (LOVENOX) injection  40 mg Subcutaneous Q24H  . [START ON 05/24/2019] ethambutol  1,200 mg Oral Daily  . [START ON 05/24/2019] feeding supplement (ENSURE ENLIVE)  237 mL Oral Q24H  . ipratropium  2 puff Inhalation TID  .  isoniazid  300 mg Oral Daily   And  . vitamin B-6  50 mg Oral Daily  . levalbuterol  2 puff Inhalation TID  . mouth rinse  15 mL Mouth Rinse BID  . mometasone-formoterol  2  puff Inhalation BID  . multivitamin with minerals  1 tablet Oral Daily  . pantoprazole  40 mg Oral Q0600  . pravastatin  20 mg Oral q1800  . [START ON 05/24/2019] pyrazinamide  1,500 mg Oral Daily  . rifampin  600 mg Oral Daily  . thiamine  100 mg Oral Daily   Continuous Infusions: . sodium chloride Stopped (05/23/19 0250)  . ampicillin-sulbactam (UNASYN) IV Stopped (05/23/19 1443)    Assessment & Plan:    1.Acute hypoxic respiratory failure likely secondary to bilateral cavitary pneumonia, with concern for TB complicated by suspected aspiration pneumonia.  Appreciate ID input, patient initiated on rifampin, isoniazid ethambutol, pyrazinamide therapy.  On multivitamin/thiamine. Patient moved to stepdown unit, high level of care, due to increased work of breathing on 05/19/2019 and ABG showing hypoxia requiring  100% NRB M transiently.  Seen by pulmonary and started on antibiotics for aspiration pneumonia.  Remains in negative pressure isolation and droplet precautions.  ID to contact health department and arrange direct observation therapy if Mycobacterium Tuberculosis confirmed.  Taper O2 as tolerated.[place] TB exposure. ?Mycobacterium avium of differential. Pulmonary signed off. ID will f/u in am and recommend further  2.  Aspiration pneumonia: On IV antibiotics as discussed above.  Seen by speech therapy and started on dysphagia diet (pured) with honey thick liquids.  Nurse reports semi-formed/loose stools likely related to diet.  Loperamide as needed  3. Questionable mass in left atrium on 2D echo 05/16/19 Could not r/o vegetation on 2D echo 05/16/19 :Severe thickening of the mitral valve leaflet. Severe calcification of the mitral valve leaflet. There is severe mitral annular calcification present. Severe and bulky posterior annular calcification cannot r/o vegetation.Moderate thickening of the aortic valve. .Subcostal images suggest mobile mass in RA  Suggest TEE if clinically indicated. Cardiology consulted and D/W Dr Delton See today who reviewed echo and stated echo findings of mass might be reflective of non specific congenital abnormality-she stated she will put in a note and arrange outpatient f/u when pulmonary issues resolve.  4.  NSVT: In the setting of electrolyte imbalances.  Replace potassium/magnesium.  Echo as above with preserved EF.  5, CKD 3: Appears to be close to his baseline with creatinine of 1.42 and GFR of 49 Creatinine 1.63 on 05/21/2019 . Off IV Lasix,  avoid nephrotoxins  6. Type 2 diabetes, complicated by hypoglycemia in the setting of recent weight loss, poor oral intake and CKD : Hemoglobin A1c 6.3, sliding scale insulin held after hypoglycemic event Encourage oral intake and avoid hypoglycemia  7. Recurrent hypokalemia: Potassium levels have been less than 3 and received several po/ IV supplements. Nl mag/phosphorus. Check  Potassium level today and  Replace as needed  8. Unintentional weight loss: In the setting of problem #1. Dietitian following.  Appetite improving Continue oral supplement   9. Tobacco use disorder :Nicotine patch as needed  10. GERD : started on protonix by ID   DVT prophylaxis: Lovenox Code Status: Full code Family / Patient Communication: Discussed with patient and answered all questions to satisfaction. Disposition Plan: PT recommended SNF but patient wishes to go home CSW consulted for possible SNF placement, might be difficult with current clinical condition.  LOS: 8 days    Time spent: 35 minutes    Guilford Shi, MD [facility] Pager [phone]  If 7PM-7AM, please contact night-coverage [email or web address] Password [facility] 05/23/2019, 5:37 PM

## 2019-05-23 NOTE — TOC Initial Note (Signed)
Transition of Care [facility]) - Initial/Assessment Note    Patient Details  Name: Clarence Maldonado MRN: [id number] Date of Birth: [birth year]  [facility] CM/SW Contact:    Bartholome Bill, RN Phone Number: 05/23/2019, 11:52 AM  Clinical Narrative:                 This CM spoke with pt about DC planning. Pt states that he wants to go home, not to snf. Pt states that this is the first day he has been out of bed and he wants to walk more because he is feeling better. TOC will continue to follow along and assist with DC planning.  Expected Discharge Plan: Home w Home Health Services Barriers to Discharge: Continued Medical Work up   Patient Goals and CMS Choice Patient states their goals for this hospitalization and ongoing recovery are:: To get moving and feeling better      Expected Discharge Plan and Services Expected Discharge Plan: Home w Home Health Services   Discharge Planning Services: CM Consult   Living arrangements for the past 2 months: Mobile Home                                      Prior Living Arrangements/Services Living arrangements for the past 2 months: Mobile Home Lives with:: Self Patient language and need for interpreter reviewed:: Yes        Need for Family Participation in Patient Care: Yes (Comment) Care giver support system in place?: No (comment)      Activities of Daily Living Home Assistive Devices/Equipment: None ADL Screening (condition at time of admission) Patient's cognitive ability adequate to safely complete daily activities?: Yes Is the patient deaf or have difficulty hearing?: No Does the patient have difficulty seeing, even when wearing glasses/contacts?: No Does the patient have difficulty concentrating, remembering, or making decisions?: No Patient able to express need for assistance with ADLs?: Yes Does the patient have difficulty dressing or bathing?: No Independently performs ADLs?: No Communication: Needs  assistance Is this a change from baseline?: Change from baseline, expected to last >3 days Dressing (OT): Needs assistance Is this a change from baseline?: Change from baseline, expected to last >3 days Grooming: Needs assistance Is this a change from baseline?: Change from baseline, expected to last >3 days Feeding: Needs assistance Is this a change from baseline?: Change from baseline, expected to last >3 days Bathing: Needs assistance Is this a change from baseline?: Change from baseline, expected to last >3 days Toileting: Needs assistance Is this a change from baseline?: Change from baseline, expected to last >3days In/Out Bed: Needs assistance Is this a change from baseline?: Change from baseline, expected to last >3 days Walks in Home: Needs assistance Is this a change from baseline?: Change from baseline, expected to last >3 days Does the patient have difficulty walking or climbing stairs?: Yes Weakness of Legs: Both Weakness of Arms/Hands: Both  Permission Sought/Granted                  Emotional Assessment Appearance:: Appears stated age     Orientation: : Oriented to Self, Oriented to Place, Oriented to  Time, Oriented to Situation      Admission diagnosis:  Hypokalemia [E87.6] Lung infiltrate [R91.8] Fatigue, unspecified type [R53.83] Anemia, unspecified type [D64.9] Patient Active Problem List   Diagnosis Date Noted  . Aspiration pneumonia (HCC) 05/16/2019  .  Cavitary lesion of lung 05/15/2019  . Hypokalemia 05/15/2019  . Cavitary pneumonia 05/15/2019  . Anemia 05/15/2019  . Hypermetropia of both eyes 03/21/2018  . Weight loss 03/21/2018  . Hyperlipidemia associated with type 2 diabetes mellitus ([place]) 03/21/2018  . Insomnia 02/03/2017  . Cognitive change 01/29/2016  . Illiterate 01/03/2014  . Abnormal CT scan, chest 01/03/2014  . Controlled type 2 diabetes mellitus with stage 3 chronic kidney disease, without long-term current use of insulin ([place])  01/05/2013  . Stranguria 01/05/2013  . Essential hypertension 01/04/2013  . CKD (chronic kidney disease) stage 3, GFR 30-59 ml/min (HCC) 01/04/2013  . Hyperlipidemia 01/04/2013  . Tobacco use disorder 01/04/2013  . BPH without obstruction/lower urinary tract symptoms 01/04/2013   PCP:  Gayland Curry, DO Pharmacy:   [facility] [address] [place], [place]. [place] [address] Phone: [phone] Fax: [phone]  [id number], [place]. [place] [address] Phone: [phone] Fax: [phone]  [id number] - [place], [place] - [address] [place] [address] Phone: [phone] Fax: [phone]     Social Determinants of Health ([place]) Interventions    Readmission Risk Interventions Readmission Risk Prevention Plan 05/23/2019  Transportation Screening Complete  PCP or Specialist Appt within 3-5 Days Not Complete  Not Complete comments Not ready for dc  HRI or [place] Complete  Social Work Consult for [place] Planning/Counseling Not Complete  SW consult not completed comments nA  Palliative Care Screening Not Applicable  Medication Review Press photographer) Complete  Some recent data might be hidden

## 2019-05-23 NOTE — Progress Notes (Signed)
No change in pulmonary recs.   Id following for afb results.   Will sign off. Please call should there be any change or new questions.   Thank you.

## 2019-05-24 DIAGNOSIS — D508 Other iron deficiency anemias: Secondary | ICD-10-CM

## 2019-05-24 DIAGNOSIS — A159 Respiratory tuberculosis unspecified: Secondary | ICD-10-CM

## 2019-05-24 LAB — BASIC METABOLIC PANEL
Anion gap: 9 (ref 5–15)
BUN: 43 mg/dL — ABNORMAL HIGH (ref 8–23)
CO2: 30 mmol/L (ref 22–32)
Calcium: 8.7 mg/dL — ABNORMAL LOW (ref 8.9–10.3)
Chloride: 104 mmol/L (ref 98–111)
Creatinine, Ser: 1.7 mg/dL — ABNORMAL HIGH (ref 0.61–1.24)
GFR calc Af Amer: 46 mL/min — ABNORMAL LOW (ref >60)
GFR calc non Af Amer: 39 mL/min — ABNORMAL LOW (ref >60)
Glucose, Bld: 94 mg/dL (ref 70–99)
Potassium: 3.2 mmol/L — ABNORMAL LOW (ref 3.5–5.1)
Sodium: 143 mmol/L (ref 135–145)

## 2019-05-24 LAB — HEPATIC FUNCTION PANEL
ALT: 13 U/L (ref 0–44)
AST: 27 U/L (ref 15–41)
Albumin: 1.7 g/dL — ABNORMAL LOW (ref 3.5–5.0)
Alkaline Phosphatase: 80 U/L (ref 38–126)
Bilirubin, Direct: 0.1 mg/dL (ref 0.0–0.2)
Indirect Bilirubin: 0.4 mg/dL (ref 0.3–0.9)
Total Bilirubin: 0.5 mg/dL (ref 0.3–1.2)
Total Protein: 5.2 g/dL — ABNORMAL LOW (ref 6.5–8.1)

## 2019-05-24 LAB — CBC
HCT: 29.9 % — ABNORMAL LOW (ref 39.0–52.0)
Hemoglobin: 8.4 g/dL — ABNORMAL LOW (ref 13.0–17.0)
MCH: 22.5 pg — ABNORMAL LOW (ref 26.0–34.0)
MCHC: 28.1 g/dL — ABNORMAL LOW (ref 30.0–36.0)
MCV: 80.2 fL (ref 80.0–100.0)
Platelets: 162 10*3/uL (ref 150–400)
RBC: 3.73 MIL/uL — ABNORMAL LOW (ref 4.22–5.81)
RDW: 17.1 % — ABNORMAL HIGH (ref 11.5–15.5)
WBC: 7.2 10*3/uL (ref 4.0–10.5)
nRBC: 0 % (ref 0.0–0.2)

## 2019-05-24 LAB — GLUCOSE, CAPILLARY
Glucose-Capillary: 104 mg/dL — ABNORMAL HIGH (ref 70–99)
Glucose-Capillary: 74 mg/dL (ref 70–99)
Glucose-Capillary: 77 mg/dL (ref 70–99)
Glucose-Capillary: 139 mg/dL — ABNORMAL HIGH (ref 70–99)
Glucose-Capillary: 90 mg/dL (ref 70–99)
Glucose-Capillary: 128 mg/dL — ABNORMAL HIGH (ref 70–99)

## 2019-05-24 LAB — MTB NAA WITHOUT AFB CULTURE: M Tuberculosis, Naa: POSITIVE — AB

## 2019-05-24 MED ORDER — POTASSIUM CHLORIDE CRYS ER 20 MEQ PO TBCR
40.0000 meq | EXTENDED_RELEASE_TABLET | Freq: Once | ORAL | Status: AC
  Administered 2019-05-24: 40 meq via ORAL
  Filled 2019-05-24: qty 2

## 2019-05-24 MED ORDER — POTASSIUM CHLORIDE CRYS ER 20 MEQ PO TBCR
40.0000 meq | EXTENDED_RELEASE_TABLET | Freq: Every day | ORAL | Status: DC
  Administered 2019-05-25 – 2019-05-27 (×3): 40 meq via ORAL
  Filled 2019-05-24 (×3): qty 2

## 2019-05-24 NOTE — Plan of Care (Signed)
Plan of care reviewed and discussed with the patient. 

## 2019-05-24 NOTE — Progress Notes (Signed)
PROGRESS NOTE                                                                                                                                                                                                             Patient Demographics:    Clarence DittyRobert Sloniker, is a 73 y.o. male, DOB - January 02, 1946, ZOX:[id number]  Admit date - 05/15/2019   Admitting Physician Therisa DoyneAnastassia Doutova, MD  Outpatient Primary MD for the patient is Kermit Baloeed, Tiffany L, DO  LOS - 9  Outpatient Specialists: None  Chief Complaint  Patient presents with   Failure To Thrive   Fatigue       Brief Narrative   73 year old male with history of stroke, COPD, CKD stage III, tobacco use, diabetes mellitus type 2, hypertension presented to the ED with progressive weakness for past 3 months with frequent falls.  He called EMS after a fall.  Reported poor p.o. intake and losing almost 43 pounds in the past 2 months.  No prior history of TB or exposure.  In the ED COVID-19 was tested negative.  CT of the chest showed multiple cavitary lesion suggestive of malignancy versus TB/atypical infection. Patient placed on airborne isolation.  AFB sputum was positive and started on empiric 4 drug regimen.  Quantified on TB Gold ordered and pending. Hospital course complicated with patient developing acute hypoxic respiratory failure on 8/11 with ABG showing significant hypoxemia and hypercapnia (PO2 57 and PCO2 45).  Placed on empiric IV antibiotics.  Also found to have significant dysphagia.     Subjective:   Patient maintaining sats on 3 L.  Appetite has started to improve.  Denies any cough but still weak and has not been able to get out of bed without assistance.   Assessment  & Plan :    Principal Problem: Acute respiratory failure with hypoxia (HCC) Likely combination of bilateral cavitary pneumonia concerning for TB and complicated by aspiration pneumonia.  AFB smear  and QuantiFERON TB Gold positive.  MTB probe also positive for TB. ID consult appreciated and started on 4 drug therapy (isoniazid, rifampin, ethambutol and paracetamol).  Continue thiamine and multivitamin.  Further recommendations per ID. Require stepdown monitoring for hypoxia and hypercapnia on nonrebreather transiently. Now better with empiric antibiotic for aspiration pneumonia. Continue airborne isolation. Wean oxygen as tolerated.  Patient is extremely deconditioned and  high fall risk.  He reports living alone and wants to go home upon discharge.  He is extremely unsafe to be discharged home and get DOT for TB.  Active Problems: ?  Mass in left atrium on 2D echo Unable to rule out vegetation.  Comments on severe thickening of the mitral valve leaflet with severe calcification with severe and bulky posterior annular calcification.  Subcostal images suggest mobile mass in the right atrium.  Cardiology consulted and was discussed with Dr. Delton See who reviewed the echo and suggested the mass might be reflective of nonspecific congenital abnormality.  Recommends she will arrange outpatient follow-up once pulmonary issues resolve.  Nonsustained V. tach In the setting of persistent hypokalemia and hypomagnesemia.  Replenished.  Stable on telemetry. Continue daily kcl supplement   Iron deficiency anemia Low iron panel, normal B12 (483). Hemoccult negative. Add iron supplement if no interaction with ATT  Type 2 diabetes mellitus with hyperglycemia Secondary to severe weight loss and poor p.o. intake.  A1c of 6.3.  Monitor on sliding scale coverage only.  Adequate p.o. intake encouraged   Weight loss with protein calorie malnutrition, Secondary to underlying TB and failure to thrive.  Nutrition and PT consult appreciated.  Needs SNF.   Chronic kidney disease stage 3 Stable. Avoid nephrotoxins   Code Status : full  Family Communication  : none  Disposition Plan  : SNF possibly in  48-72 hrs  Barriers For Discharge : active symptoms  Consults  : ID, pulmonary  Procedures  : CT chest  DVT Prophylaxis  :  Lovenox -   Lab Results  Component Value Date   PLT 162 05/24/2019    Antibiotics  :    Anti-infectives (From admission, onward)   Start     Dose/Rate Route Frequency Ordered Stop   05/24/19 1000  pyrazinamide tablet 1,500 mg     1,500 mg Oral Daily 05/23/19 1626     05/24/19 1000  ethambutol (MYAMBUTOL) tablet 1,200 mg     1,200 mg Oral Daily 05/23/19 1626     05/19/19 1600  isoniazid (NYDRAZID) tablet 300 mg     300 mg Oral Daily 05/19/19 1428     05/19/19 1600  rifampin (RIFADIN) capsule 600 mg     600 mg Oral Daily 05/19/19 1428     05/19/19 1600  pyrazinamide tablet 1,000 mg  Status:  Discontinued     1,000 mg Oral Daily 05/19/19 1428 05/23/19 1626   05/19/19 1600  ethambutol (MYAMBUTOL) tablet 800 mg  Status:  Discontinued     800 mg Oral Daily 05/19/19 1428 05/23/19 1626   05/18/19 0000  vancomycin (VANCOCIN) IVPB 1000 mg/200 mL premix  Status:  Discontinued     1,000 mg 200 mL/hr over 60 Minutes Intravenous Every 36 hours 05/16/19 1219 05/17/19 1058   05/16/19 1130  vancomycin (VANCOCIN) 1,250 mg in sodium chloride 0.9 % 250 mL IVPB     1,250 mg 166.7 mL/hr over 90 Minutes Intravenous  Once 05/16/19 1100 05/16/19 1404   05/16/19 1000  ampicillin-sulbactam (UNASYN) 1.5 g in sodium chloride 0.9 % 100 mL IVPB     1.5 g 200 mL/hr over 30 Minutes Intravenous Every 6 hours 05/16/19 0952 05/23/19 2359        Objective:   Vitals:   05/23/19 1713 05/23/19 2229 05/24/19 0643 05/24/19 1405  BP: (!) 143/74 (!) 143/83 (!) 154/81 (!) 147/80  Pulse: 99 (!) 105 84 90  Resp: Temp: 98.4  F (36.9 C) 97.7 F (36.5 C) (!) 96.5 F (35.8 C) (!) 97.1 F (36.2 C)  TempSrc: Oral Oral Oral Oral  SpO2: 90% 95% 99%   Weight:   64.5 kg   Height:        Wt Readings from Last 3 Encounters:  05/24/19 64.5 kg  03/23/19 73.9 kg  03/21/18 78.5 kg      Intake/Output Summary (Last 24 hours) at 05/24/2019 1554 Last data filed at 05/24/2019 1410 Gross per 24 hour  Intake 120 ml  Output 1600 ml  Net -1480 ml     Physical Exam  Gen: not in distress, faituged HEENT:  pallor+, moist mucosa, supple neck Chest: clear b/l, no added sounds CVS: N S1&S2, no murmurs, GI: soft, NT, ND, BS+ Musculoskeletal: warm, no edema     Data Review:    CBC Recent Labs  Lab 05/18/19 0353 05/19/19 0713 05/24/19 0446  WBC 5.7 6.7 7.2  HGB 9.4* 9.7* 8.4*  HCT 33.5* 34.5* 29.9*  PLT 236 212 162  MCV 79.8* 81.0 80.2  MCH 22.4* 22.8* 22.5*  MCHC 28.1* 28.1* 28.1*  RDW 17.2* 17.6* 17.1*    Chemistries  Recent Labs  Lab 05/19/19 0713 05/21/19 0456 05/22/19 0153 05/23/19 0209 05/23/19 1908 05/24/19 0446  NA 144 146* 144  --  142 143  K 3.5 2.6* 2.9*  --  3.1* 3.2*  CL 113* 105 103  --  103 104  CO2 --  30 30  GLUCOSE 175* 76 86  --  132* 94  BUN 29* 39* 41*  --  48* 43*  CREATININE 1.42* 1.63* 1.62*  --  1.97* 1.70*  CALCIUM 8.7* 8.9 8.5*  --  8.7* 8.7*  MG  --   --  1.9 2.2  --   --   AST  --   --   --   --   --  27  ALT  --   --   --   --   --  13  ALKPHOS  --   --   --   --   --  80  BILITOT  --   --   --   --   --  0.5   ------------------------------------------------------------------------------------------------------------------ No results for input(s): CHOL, HDL, LDLCALC, TRIG, CHOLHDL, LDLDIRECT in the last 72 hours.  Lab Results  Component Value Date   HGBA1C 6.3 (H) 05/16/2019   ------------------------------------------------------------------------------------------------------------------ No results for input(s): TSH, T4TOTAL, T3FREE, THYROIDAB in the last 72 hours.  Invalid input(s): FREET3 ------------------------------------------------------------------------------------------------------------------ No results for input(s): VITAMINB12, FOLATE, FERRITIN, TIBC, IRON, RETICCTPCT in the last 72  hours.  Coagulation profile No results for input(s): INR, PROTIME in the last 168 hours.  No results for input(s): DDIMER in the last 72 hours.  Cardiac Enzymes No results for input(s): CKMB, TROPONINI, MYOGLOBIN in the last 168 hours.  Invalid input(s): CK ------------------------------------------------------------------------------------------------------------------    Component Value Date/Time   BNP 995.9 (H) 05/19/2019 1759    Inpatient Medications  Scheduled Meds:  buPROPion  150 mg Oral Daily   Chlorhexidine Gluconate Cloth  6 each Topical Daily   enoxaparin (LOVENOX) injection  40 mg Subcutaneous Q24H   ethambutol  1,200 mg Oral Daily   feeding supplement (ENSURE ENLIVE)  237 mL Oral Q24H   ipratropium  2 puff Inhalation TID   isoniazid  300 mg Oral Daily   And   vitamin B-6  50 mg Oral Daily   levalbuterol  2 puff  Inhalation TID   mouth rinse  15 mL Mouth Rinse BID   mometasone-formoterol  2 puff Inhalation BID   multivitamin with minerals  1 tablet Oral Daily   pantoprazole  40 mg Oral Q0600   pravastatin  20 mg Oral q1800   pyrazinamide  1,500 mg Oral Daily   rifampin  600 mg Oral Daily   thiamine  100 mg Oral Daily   Continuous Infusions:  sodium chloride Stopped (05/23/19 0250)   PRN Meds:.sodium chloride, acetaminophen **OR** acetaminophen, alum & mag hydroxide-simeth, dextrose, HYDROcodone-acetaminophen, loperamide, ondansetron **OR** ondansetron (ZOFRAN) IV, phenol, Resource ThickenUp Clear  Micro Results Recent Results (from the past 240 hour(s))  SARS Coronavirus 2 [facility] order, Performed in [facility] hospital lab) Nasopharyngeal Nasopharyngeal Swab     Status: None   Collection Time: 05/15/19  5:03 PM   Specimen: Nasopharyngeal Swab  Result Value Ref Range Status   SARS Coronavirus 2 NEGATIVE NEGATIVE Final    Comment: (NOTE) If result is NEGATIVE SARS-CoV-2 target nucleic acids are NOT DETECTED. The SARS-CoV-2 RNA is  generally detectable in upper and lower  respiratory specimens during the acute phase of infection. The lowest  concentration of SARS-CoV-2 viral copies this assay can detect is 250  copies / mL. A negative result does not preclude SARS-CoV-2 infection  and should not be used as the sole basis for treatment or other  patient management decisions.  A negative result may occur with  improper specimen collection / handling, submission of specimen other  than nasopharyngeal swab, presence of viral mutation(s) within the  areas targeted by this assay, and inadequate number of viral copies  (<250 copies / mL). A negative result must be combined with clinical  observations, patient history, and epidemiological information. If result is POSITIVE SARS-CoV-2 target nucleic acids are DETECTED. The SARS-CoV-2 RNA is generally detectable in upper and lower  respiratory specimens dur ing the acute phase of infection.  Positive  results are indicative of active infection with SARS-CoV-2.  Clinical  correlation with patient history and other diagnostic information is  necessary to determine patient infection status.  Positive results do  not rule out bacterial infection or co-infection with other viruses. If result is PRESUMPTIVE POSTIVE SARS-CoV-2 nucleic acids MAY BE PRESENT.   A presumptive positive result was obtained on the submitted specimen  and confirmed on repeat testing.  While 2019 novel coronavirus  (SARS-CoV-2) nucleic acids may be present in the submitted sample  additional confirmatory testing may be necessary for epidemiological  and / or clinical management purposes  to differentiate between  SARS-CoV-2 and other Sarbecovirus currently known to infect humans.  If clinically indicated additional testing with an alternate test  methodology [id number]) is advised. The SARS-CoV-2 RNA is generally  detectable in upper and lower respiratory sp ecimens during the acute  phase of  infection. The expected result is Negative. Fact Sheet for Patients:  BoilerBrush.com.cy Fact Sheet for Healthcare Providers: https://pope.com/ This test is not yet approved or cleared by the [place] FDA and has been authorized for detection and/or diagnosis of SARS-CoV-2 by FDA under an Emergency Use Authorization (EUA).  This EUA will remain in effect (meaning this test can be used) for the duration of the COVID-19 declaration under Section 564(b)(1) of the Act, 21 U.S.C. section 360bbb-3(b)(1), unless the authorization is terminated or revoked sooner. Performed at [facility], [address] [place], [place] [address]   Culture, blood (Routine X 2) w Reflex to ID Panel  Status: None   Collection Time: 05/16/19  1:07 AM   Specimen: BLOOD  Result Value Ref Range Status   Specimen Description   Final    BLOOD LEFT ANTECUBITAL Performed at [facility], [address] [place], [place] [address]    Special Requests   Final    BOTTLES DRAWN AEROBIC AND ANAEROBIC Blood Culture adequate volume Performed at [facility], [address] [place], [place] [address]    Culture   Final    NO GROWTH 5 DAYS Performed at [facility], [address] [place], [place] [address]    Report Status 05/21/2019 FINAL  Final  Culture, blood (Routine X 2) w Reflex to ID Panel     Status: None   Collection Time: 05/16/19  1:07 AM   Specimen: BLOOD  Result Value Ref Range Status   Specimen Description   Final    BLOOD RIGHT ANTECUBITAL Performed at [facility], [address] [place], [place] [address]    Special Requests   Final    BOTTLES DRAWN AEROBIC AND ANAEROBIC Blood Culture results may not be optimal due to an excessive volume of blood received in culture bottles Performed at [facility], [address] [place], [place]  [address]    Culture   Final    NO GROWTH 5 DAYS Performed at [facility], [address] [place], [place] [address]    Report Status 05/21/2019 FINAL  Final  Acid Fast Smear (AFB)     Status: Abnormal   Collection Time: 05/16/19  2:16 AM   Specimen: Sputum  Result Value Ref Range Status   AFB Specimen Processing Concentration  Final   Acid Fast Smear Positive (A)  Final    Comment: (NOTE) 2+, 4-36 acid-fast bacilli per 10 fields at 400X magnification, fluorescent smear NOTIFIED KATHLEEN ON 05/17/19 AT 15:17 DS FAXED TO [phone] Performed At: [facility] [address] [place], [place] [id number] Jolene SchimkeNagendra Sanjai MD UY:[phone]    Source (AFB) SPUTUM  Final    Comment: Performed at [facility], [address] [place], [place] [address]  Acid Fast Smear (AFB)     Status: None   Collection Time: 05/16/19 12:07 PM   Specimen: Sputum  Result Value Ref Range Status   AFB Specimen Processing Concentration  Final   Acid Fast Smear Positive  Final    Comment: CRITICAL RESULT CALLED TO, READ BACK BY AND VERIFIED WITH: RITA,SAKAYI AT 0350 ON 05/19/19 BY A,MOHAMED (NOTE) 3+, 4-36 acid-fast bacilli per field at 400X magnification, fluorescent smear NOTIFIED KATHLEEN ON 05/17/19 AT 15:17 DS FAXED TO [phone] Performed At: [facility]  [address] [place], [place] [id number] Jolene SchimkeNagendra Sanjai MD SW:[phone]    Source (AFB) SPUTUM  Final    Comment: Performed at [facility], [address] [place], [place] [address]  MRSA PCR Screening     Status: None   Collection Time: 05/16/19 12:07 PM   Specimen: Nasal Mucosa; Nasopharyngeal  Result Value Ref Range Status   MRSA by PCR NEGATIVE NEGATIVE Final    Comment:        The GeneXpert MRSA Assay (FDA approved for NASAL specimens only), is one component of a comprehensive MRSA colonization surveillance program. It is not intended to diagnose MRSA infection nor to  guide or monitor treatment for MRSA infections. Performed at [facility], [address] [place], [place] [address]  Expectorated sputum assessment w rflx to resp cult     Status: None   Collection Time: 05/16/19 12:46 PM   Specimen: SPU  Result Value Ref Range Status   Specimen Description SPUTUM  Final   Special Requests NONE  Final   Sputum evaluation   Final    THIS SPECIMEN IS ACCEPTABLE FOR SPUTUM CULTURE Performed at [facility], [address] [place], [place] [address]    Report Status 05/16/2019 FINAL  Final  Culture, respiratory     Status: None   Collection Time: 05/16/19 12:46 PM   Specimen: SPU  Result Value Ref Range Status   Specimen Description   Final    SPUTUM Performed at [facility], [address] [place], [place] [address]    Special Requests   Final    NONE Reflexed from [id number] Performed at [facility], [address] [place], [place] [address]    Gram Stain   Final    MODERATE WBC PRESENT,BOTH PMN AND MONONUCLEAR MODERATE GRAM POSITIVE COCCI FEW GRAM VARIABLE ROD    Culture   Final    FEW Consistent with normal respiratory flora. Performed at [facility], [address] [place], [place] [address]    Report Status 05/18/2019 FINAL  Final  Acid Fast Smear (AFB)     Status: Abnormal   Collection Time: 05/17/19  8:08 PM   Specimen: Sputum  Result Value Ref Range Status   AFB Specimen Processing Concentration  Final   Acid Fast Smear Positive (A)  Final    Comment: (NOTE) 3+, 4-36 acid-fast bacilli per field at 400X magnification, fluorescent smear Positive smear reported to Aisha M. on 05/19/19 at 3:34 A.M by BMS. Performed At: [facility] [place] [address] [place], [place] [id number] Jolene SchimkeNagendra Sanjai MD ON:[id number]:[phone]    Source (AFB) SPUTUM  Final    Comment: Performed at [facility], [address] [place], [place] [address]     Radiology Reports Dg Chest 2 View  Result Date: 05/15/2019 CLINICAL DATA:  EMS reports from home, lives alone, friend called for failure to thrive for three months. Weakness and recent fall this a.m. Smoker. Productive cough. Hx of Cardiomegaly and Type II Diabetes. EXAM: CHEST - 2 VIEW COMPARISON:  Chest CT, 09/07/2011 FINDINGS: There are numerous ill-defined bilateral lung nodules, more numerous on the left, with intervening hazy ground-glass type opacities. Lungs are hyperexpanded. There is an emphysematous bleb in the right upper lobe measuring approximately 6 cm. No pleural effusion.  No pneumothorax. Cardiac silhouette is normal in size.  No mediastinal masses. Skeletal structures are intact. IMPRESSION: 1. Multiple bilateral ill-defined nodular type opacities with intervening ground-glass opacities, greater throughout the left lung than on the right. Findings may be due to multifocal infection or inflammation. Metastatic disease is also in the differential diagnosis. Recommend follow-up assessment with chest CT with contrast. Electronically Signed   By: Amie Portlandavid  Ormond M.D.   On: 05/15/2019 19:24   Ct Head Wo Contrast  Result Date: 05/17/2019 CLINICAL DATA:  Head trauma with ataxia EXAM: CT HEAD WITHOUT CONTRAST TECHNIQUE: Contiguous axial images were obtained from the base of the skull through the vertex without intravenous contrast. COMPARISON:  08/27/2008 head CT FINDINGS: Brain: No evidence of acute infarction, hemorrhage, hydrocephalus, extra-axial collection or mass lesion/mass effect. Mild motion artifact of the ventral brain causing duplicated appearance of the falx. Generalized atrophy, central predominant, progressed from 2009. Chronic small vessel ischemia that is  confluent in the deep white matter-progressive. Remote lacunar infarct in the left thalamus. Vascular: Atherosclerotic calcification. The calcification has occurred at the right MCA and basilar since prior. Skull: Negative for  fracture Sinuses/Orbits: No evidence of injury IMPRESSION: 1. No evidence of intracranial injury. 2. Atrophy and chronic small vessel ischemia with progression from 2009. 3. Mild motion artifact. Electronically Signed   By: Monte Fantasia M.D.   On: 05/17/2019 04:55   Ct Chest W Contrast  Result Date: 05/15/2019 CLINICAL DATA:  Dyspnea, chronic, failure to thrive EXAM: CT CHEST WITH CONTRAST TECHNIQUE: Multidetector CT imaging of the chest was performed during intravenous contrast administration. CONTRAST:  74mL OMNIPAQUE IOHEXOL 300 MG/ML  SOLN COMPARISON:  Same day chest radiograph, CT chest 09/07/2011 FINDINGS: Cardiovascular: Normal heart size. No pericardial effusion. Atherosclerotic plaque within the normal caliber aorta. Atherosclerotic calcification of the coronary arteries. Calcifications of the mitral annulus Mediastinum/Nodes: No enlarged mediastinal or axillary lymph nodes. Thyroid gland, trachea, and esophagus demonstrate no significant findings. Lungs/Pleura: Widespread randomly distributed, apically predominant consolidative opacities with central cavitation are present throughout both hemithoraces. A larger thick-walled cystic lesion is present in the right upper lobe measuring up to 6.3 cm in size. This larger focus corresponds well to an area ground-glass opacity on CT from 2012. No pneumothorax. No effusion. Upper Abdomen: Atrophic appearance of the left kidney. Few fluid attenuation cyst in the right kidney. Dependently layering gallstone within the otherwise normal gallbladder. Small hiatal hernia. Accessory splenule. Subcentimeter hypoattenuating foci in the spleen too small to fully characterize on CT imaging but statistically likely benign. Musculoskeletal: Multilevel degenerative changes are present in the imaged portions of the spine. IMPRESSION: Widespread nodular and cavitary lesions throughout both lungs with a larger air-filled cystic lesion in the right upper lobe. Overall  appearance is nonspecific and differential could include atypical infection such as tuberculosis which requires exclusion versus other considerations including cavitating malignancy such as diffuse squamous cell or septic pulmonary emboli. Sputum cultures and tissue sampling may provide a more definitive diagnosis. Aortic Atherosclerosis (ICD10-I70.0). Electronically Signed   By: Lovena Le M.D.   On: 05/15/2019 20:39   Dg Chest Port 1 View  Result Date: 05/19/2019 CLINICAL DATA:  Hypoxia. EXAM: PORTABLE CHEST 1 VIEW COMPARISON:  Radiographs and CT scan of May 15, 2019. FINDINGS: Stable cardiomediastinal silhouette. No pneumothorax is noted. There are again noted multiple ill-defined nodular opacities throughout the left lung which may be slightly more prominent. Is uncertain if these represent multifocal pneumonia or possibly metastatic disease or other malignancy. The largest such abnormality is seen in the left lung apex. Minimal interstitial densities are noted in the right lung which may represent inflammation or scarring. Bony thorax is unremarkable. IMPRESSION: There are again noted multiple ill-defined nodular densities throughout the left lung which may be slightly more prominent compared to prior exam. Differential includes multifocal pneumonia or inflammation, or possibly metastatic disease or other malignancy. Electronically Signed   By: Marijo Conception M.D.   On: 05/19/2019 09:58   Vas [place] Lower Extremity Venous (dvt)  Result Date: 05/16/2019  Lower Venous Study Indications: Edema.  Limitations: Patient positioning. Comparison Study: no prior Performing Technologist: Abram Sander RVS  Examination Guidelines: A complete evaluation includes B-mode imaging, spectral Doppler, color Doppler, and power Doppler as needed of all accessible portions of each vessel. Bilateral testing is considered an integral part of a complete examination. Limited examinations for reoccurring indications may be  performed as noted.  +---------+---------------+---------+-----------+----------+-------+  RIGHT  Compressibility Phasicity Spontaneity Properties Summary  +---------+---------------+---------+-----------+----------+-------+  CFV       Full            Yes       Yes                             +---------+---------------+---------+-----------+----------+-------+  SFJ       Full                                                      +---------+---------------+---------+-----------+----------+-------+  FV Prox   Full                                                      +---------+---------------+---------+-----------+----------+-------+  FV Mid    Full                                                      +---------+---------------+---------+-----------+----------+-------+  FV Distal Full                                                      +---------+---------------+---------+-----------+----------+-------+  PFV       Full                                                      +---------+---------------+---------+-----------+----------+-------+  POP       Full            Yes       Yes                             +---------+---------------+---------+-----------+----------+-------+  PTV       Full                                                      +---------+---------------+---------+-----------+----------+-------+  PERO      Full                                                      +---------+---------------+---------+-----------+----------+-------+     Summary: Right: There is no evidence of deep vein thrombosis in the lower extremity. No cystic structure found in the popliteal fossa.  *See table(s) above for measurements and observations. Electronically signed by Coral Else MD on 05/16/2019 at 8:43:11 PM.    Final  Time Spent in minutes 35   Jiraiya Mcewan M.D on 05/24/2019 at 3:54 PM  Between 7am to 7pm - Pager - [phone]  After 7pm go to [email or web address] - password [facility]  [facility] -   Office  [phone]

## 2019-05-24 NOTE — Progress Notes (Signed)
SLP Cancellation Note  Patient Details Name: Clarence Maldonado MRN: [id number] DOB: 1946/05/21   Cancelled treatment:       Reason Eval/Treat Not Completed: Other (comment)  SLP communicated with Dr Baxter Flattery who requests MBS be conducted prior to pt discharge.  Anticipate possible esophagram may be helpful to adequately assess esophageal swallow and delineate swallow dysfunction source.    SLP reached out to [facility] and am awaiting approval for MBS due to pt's TB.  Will follow up tomorrow. Thanks.    Clarence Salk, MS [facility] SLP Acute Rehab Services Pager [phone] Office [phone]    [name] 05/24/2019, 4:27 PM

## 2019-05-24 NOTE — Progress Notes (Signed)
  Speech Language Pathology Treatment: (P) Dysphagia  Patient Details Name: Clarence Maldonado MRN: [id number] DOB: 03/13/1946 Today's Date: 05/24/2019 Time: (P) 1045-(P) 1116 SLP Time Calculation (min) (ACUTE ONLY): (P) 31 min  Assessment / Plan / Recommendation Clinical Impression  Pt today continues to report mild pain - pointing to proximal pharynx (bilateral valleculae region).  States this am upon waking he noted "something there" - globus.  SLP observed pt consuming 1/4 of a sandwich, medicine with puree and nectar thick liquids.  Cough during session x5 did not appear coorelated to po intake.   Did not attempt thin liquids as pt presented with overt indication of airway compromise with thin liquids.  Intake has been good even on a puree diet - and given pt eats with dentures consistently prior to admit (and they are not here and no one can bring them per pt), recommend continue dys1/nectar diet. When pt's airborne precautions may be lifted, recommend MBS and consider also an esophagram. Pt's symptoms are difficult to differentiate.  Reviewed aspiration precautions with him, assuring he stays upright after intake.  Of note, he reports feeling "full" after intake of just pills and liquids????     SLP will see pt for MBS when he is able, in the interim recommend continue diet to decrease aspiration risk.  RN informed (she was in the room with SLP and provided pt medications). Pt agreeable to plan.  Thanks.    HPI HPI: 55) 73 year old male admitted 05/15/2019 with progressive weakness and cough, failure to thrive. Pt lives alone. PMH: cardiomegaly, CVA (2009), COPD, HTN, DM, obesity, CKD III, tobacco abuse. Lung sounds: some scattered rhonchi. Course rhonchus cough.  CXR = bilateral infiltrates. Chest CT = bilateral diffuse nodular infiltrates with cavitation and a large right upper lobe cystic lesion      SLP Plan  (P) MBS(when pt able to come off airborne precautions)       Recommendations   Diet recommendations: (P) Dysphagia 1 (puree);Nectar-thick liquid Liquids provided via: (P) Cup;Straw Medication Administration: (P) Whole meds with puree Supervision: (P) Patient able to self feed Compensations: (P) Minimize environmental distractions;Slow rate;Small sips/bites                Oral Care Recommendations: (P) Oral care BID Follow up Recommendations: (P) (tbd) SLP Visit Diagnosis: (P) Dysphagia, unspecified (R13.10) Plan: (P) MBS(when pt able to come off airborne precautions)       GO                Clarence Maldonado 05/24/2019, 1:50 PM   Luanna Salk, [place] [facility] SLP [place] Pager [phone] Office [phone]

## 2019-05-24 NOTE — Progress Notes (Signed)
    [facility] for Infectious Disease    Date of Admission:  05/15/2019   Total days of antibiotics 9           ID: Clarence Maldonado is a 73 y.o. male with cavitary pneumonia found to be + AFB, and QTF but does not recall mTb exposure Principal Problem:   Aspiration pneumonia ([place]) Active Problems:   Essential hypertension   CKD (chronic kidney disease) stage 3, GFR 30-59 ml/min (HCC)   Hyperlipidemia   Tobacco use disorder   Controlled type 2 diabetes mellitus with stage 3 chronic kidney disease, without long-term current use of insulin (HCC)   Weight loss   Hyperlipidemia associated with type 2 diabetes mellitus ([place])   Cavitary lesion of lung   Hypokalemia   Cavitary pneumonia   Anemia    Subjective: Afebrile. Still having some dysphagia  Medications:  . buPROPion  150 mg Oral Daily  . Chlorhexidine Gluconate Cloth  6 each Topical Daily  . enoxaparin (LOVENOX) injection  40 mg Subcutaneous Q24H  . ethambutol  1,200 mg Oral Daily  . feeding supplement (ENSURE ENLIVE)  237 mL Oral Q24H  . ipratropium  2 puff Inhalation TID  . isoniazid  300 mg Oral Daily   And  . vitamin B-6  50 mg Oral Daily  . levalbuterol  2 puff Inhalation TID  . mouth rinse  15 mL Mouth Rinse BID  . mometasone-formoterol  2 puff Inhalation BID  . multivitamin with minerals  1 tablet Oral Daily  . pantoprazole  40 mg Oral Q0600  . pravastatin  20 mg Oral q1800  . pyrazinamide  1,500 mg Oral Daily  . rifampin  600 mg Oral Daily  . thiamine  100 mg Oral Daily    Objective: Vital signs in last 24 hours: Temp:  [96.5 F (35.8 C)-98.4 F (36.9 C)] 97.1 F (36.2 C) (08/19 1405) Pulse Rate:  [84-105] 90 (08/19 1405) Resp:  [17-20] 18 (08/19 1405) BP: (143-154)/(74-83) 147/80 (08/19 1405) SpO2:  [90 %-99 %] 99 % (08/19 0643) Weight:  [64.5 kg] 64.5 kg (08/19 0643)    Lab Results Recent Labs    05/23/19 1908 05/24/19 0446  WBC  --  7.2  HGB  --  8.4*  HCT  --  29.9*  NA 142 143   K 3.1* 3.2*  CL 103 104  CO2 30 30  BUN 48* 43*  CREATININE 1.97* 1.70*   Liver Panel Recent Labs    05/24/19 0446  PROT 5.2*  ALBUMIN 1.7*  AST 27  ALT 13  ALKPHOS 80  BILITOT 0.5  BILIDIR 0.1  IBILI 0.4    Microbiology: reviewed Studies/Results: No results found.   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 Hopefully more information to come next week. mTB probe to take 5-7 business day quoted. 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

## 2019-05-25 ENCOUNTER — Inpatient Hospital Stay (HOSPITAL_COMMUNITY): Payer: Medicare Other

## 2019-05-25 DIAGNOSIS — R1319 Other dysphagia: Secondary | ICD-10-CM

## 2019-05-25 LAB — BASIC METABOLIC PANEL
Anion gap: 6 (ref 5–15)
BUN: 40 mg/dL — ABNORMAL HIGH (ref 8–23)
CO2: 32 mmol/L (ref 22–32)
Calcium: 8.6 mg/dL — ABNORMAL LOW (ref 8.9–10.3)
Chloride: 102 mmol/L (ref 98–111)
Creatinine, Ser: 1.4 mg/dL — ABNORMAL HIGH (ref 0.61–1.24)
GFR calc Af Amer: 58 mL/min — ABNORMAL LOW (ref >60)
GFR calc non Af Amer: 50 mL/min — ABNORMAL LOW (ref >60)
Glucose, Bld: 92 mg/dL (ref 70–99)
Potassium: 3.4 mmol/L — ABNORMAL LOW (ref 3.5–5.1)
Sodium: 140 mmol/L (ref 135–145)

## 2019-05-25 LAB — GLUCOSE, CAPILLARY
Glucose-Capillary: 126 mg/dL — ABNORMAL HIGH (ref 70–99)
Glucose-Capillary: 134 mg/dL — ABNORMAL HIGH (ref 70–99)
Glucose-Capillary: 144 mg/dL — ABNORMAL HIGH (ref 70–99)
Glucose-Capillary: 80 mg/dL (ref 70–99)
Glucose-Capillary: 90 mg/dL (ref 70–99)

## 2019-05-25 MED ORDER — ZOLPIDEM TARTRATE 5 MG PO TABS
5.0000 mg | ORAL_TABLET | Freq: Every day | ORAL | Status: DC
  Administered 2019-05-26: 5 mg via ORAL
  Filled 2019-05-25: qty 1

## 2019-05-25 MED ORDER — FERROUS SULFATE 325 (65 FE) MG PO TABS
325.0000 mg | ORAL_TABLET | Freq: Two times a day (BID) | ORAL | Status: DC
  Administered 2019-05-25 – 2019-07-25 (×122): 325 mg via ORAL
  Filled 2019-05-25 (×120): qty 1

## 2019-05-25 NOTE — Progress Notes (Signed)
NT reported CBG = 94.  Did not transfer into [facility].

## 2019-05-25 NOTE — Progress Notes (Signed)
    [facility] for Infectious Disease    Date of Admission:  05/15/2019   Total days of antibiotics 7           ID: Clarence Maldonado is a 73 y.o. male with  Pulmonary TB Principal Problem:   Aspiration pneumonia ([place]) Active Problems:   Essential hypertension   CKD (chronic kidney disease) stage 3, GFR 30-59 ml/min (HCC)   Hyperlipidemia   Tobacco use disorder   Controlled type 2 diabetes mellitus with stage 3 chronic kidney disease, without long-term current use of insulin (HCC)   Weight loss   Hyperlipidemia associated with type 2 diabetes mellitus ([place])   Cavitary lesion of lung   Hypokalemia   Cavitary pneumonia   Anemia    Subjective: Undergoing swallow evaluation in fluoroscopy       Ref Range & Units 8d ago  M Tuberculosis, Naa Negative PositiveAbnormal    Comment: (NOTE)  Validation studies at [facility] have demonstrated that PCR testing is  highly sensitive for the detection of Mycobacterium tuberculosis in  smear negative or smear positive specimens. Additional studies have          Medications:  . buPROPion  150 mg Oral Daily  . enoxaparin (LOVENOX) injection  40 mg Subcutaneous Q24H  . ethambutol  1,200 mg Oral Daily  . feeding supplement (ENSURE ENLIVE)  237 mL Oral Q24H  . ferrous sulfate  325 mg Oral BID WC  . ipratropium  2 puff Inhalation TID  . isoniazid  300 mg Oral Daily   And  . vitamin B-6  50 mg Oral Daily  . levalbuterol  2 puff Inhalation TID  . mouth rinse  15 mL Mouth Rinse BID  . mometasone-formoterol  2 puff Inhalation BID  . multivitamin with minerals  1 tablet Oral Daily  . pantoprazole  40 mg Oral Q0600  . potassium chloride  40 mEq Oral Daily  . pravastatin  20 mg Oral q1800  . pyrazinamide  1,500 mg Oral Daily  . rifampin  600 mg Oral Daily  . thiamine  100 mg Oral Daily    Objective: Vital signs in last 24 hours: Temp:  [97 F (36.1 C)-99.6 F (37.6 C)] 97 F (36.1 C) (08/20 0500) Pulse Rate:  [79-92] 92 (08/20  1228) Resp:  [14-18] 18 (08/20 1228) BP: (142-157)/(73-81) 142/81 (08/20 1228) SpO2:  [97 %-100 %] 97 % (08/20 1228) Weight:  [62.8 kg] 62.8 kg (08/20 0500)     Lab Results Recent Labs    05/24/19 0446 05/25/19 0445  WBC 7.2  --   HGB 8.4*  --   HCT 29.9*  --   NA 143 140  K 3.2* 3.4*  CL 104 102  CO2 30 32  BUN 43* 40*  CREATININE 1.70* 1.40*   Liver Panel Recent Labs    05/24/19 0446  PROT 5.2*  ALBUMIN 1.7*  AST 27  ALT 13  ALKPHOS 80  BILITOT 0.5  BILIDIR 0.1  IBILI 0.4    Microbiology: MTB PCR Testing is POSITIVE Studies/Results: No results found.   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 We have contacted the health department but will need to coordinate when he is discharging home so that they can continue direct observed treatment.  [facility] for Infectious Diseases Cell: [phone] Pager: [phone]  05/25/2019, 4:34 PM

## 2019-05-25 NOTE — Progress Notes (Signed)
PROGRESS NOTE                                                                                                                                                                                                             Patient Demographics:    Clarence Maldonado, is a 73 y.o. male, DOB - [birth year], ZOX:[id number]  Admit date - 05/15/2019   Admitting Physician Therisa Doyne, MD  Outpatient Primary MD for the patient is [name], DO  LOS - 10  Outpatient Specialists: None  Chief Complaint  Patient presents with   Failure To Thrive   Fatigue       Brief Narrative   73 year old male with history of stroke, COPD, CKD stage III, tobacco use, diabetes mellitus type 2, hypertension presented to the ED with progressive weakness for past 3 months with frequent falls.  He called EMS after a fall.  Reported poor p.o. intake and losing almost 43 pounds in the past 2 months.  No prior history of TB or exposure.  In the ED COVID-19 was tested negative.  CT of the chest showed multiple cavitary lesion suggestive of malignancy versus TB/atypical infection. Patient placed on airborne isolation.  AFB sputum was positive and started on empiric 4 drug regimen.  Quantified on TB Gold ordered and pending. Hospital course complicated with patient developing acute hypoxic respiratory failure on 8/11 with ABG showing significant hypoxemia and hypercapnia (PO2 57 and PCO2 45).  Placed on empiric IV antibiotics.  Also found to have significant dysphagia.     Subjective:   No overnight events.  Denies any shortness of breath or worsening cough.   Assessment  & Plan :    Principal Problem: Acute respiratory failure with hypoxia (HCC) Likely combination of bilateral cavitary pneumonia concerning for TB and complicated by aspiration pneumonia.  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  MTB NAA positive.  AFB culture and probe  pending. ID consult appreciated and started on 4 drug therapy (rifampin, isoniazid by acetamide and ethambutol for 2 months to cover both latent and presumed active TB. continue B6 and thiamine.  Require stepdown monitoring for hypoxia and hypercapnia on nonrebreather transiently. Now better with empiric antibiotic for aspiration pneumonia. Continue airborne isolation. Wean oxygen as tolerated.  Patient is extremely deconditioned and high fall risk. Needs SNF. He agrees for SNF  during our discussion today.  Active Problems: Dysphagia  on dys level 1 diet.MBS and esophagogram this afternoon.  ?  Mass in left atrium on 2D echo Unable to rule out vegetation.  Comments on severe thickening of the mitral valve leaflet with severe calcification with severe and bulky posterior annular calcification.  Subcostal images suggest mobile mass in the right atrium.  Cardiology consulted and was discussed with Dr. Delton SeeNelson who reviewed the echo and suggested the mass might be reflective of nonspecific congenital abnormality.  Recommends she will arrange outpatient follow-up once pulmonary issues resolve.  Nonsustained V. tach In the setting of persistent hypokalemia and hypomagnesemia.  Replenished.  Stable on telemetry. Continue daily kcl supplement   Iron deficiency anemia Low iron panel, normal B12 (483). Hemoccult negative. Add iron supplement if no interaction with ATT  Type 2 diabetes mellitus, controlled Secondary to severe weight loss and poor p.o. intake.  A1c of 6.3.  Monitor on sliding scale coverage only.  Adequate p.o. intake encouraged   Weight loss with protein calorie malnutrition, Secondary to underlying TB and failure to thrive.  Nutrition and PT consult appreciated.  Needs SNF.   Chronic kidney disease stage 3 Stable. Avoid nephrotoxins    Code Status : full  Family Communication  : none  Disposition Plan  : SNF possibly in 48-hrs  Barriers For Discharge : active  symptoms  Consults  : ID, pulmonary  Procedures  : CT chest  DVT Prophylaxis  :  Lovenox -   Lab Results  Component Value Date   PLT 162 05/24/2019    Antibiotics  :    Anti-infectives (From admission, onward)   Start     Dose/Rate Route Frequency Ordered Stop   05/24/19 1000  pyrazinamide tablet 1,500 mg     1,500 mg Oral Daily 05/23/19 1626     05/24/19 1000  ethambutol (MYAMBUTOL) tablet 1,200 mg     1,200 mg Oral Daily 05/23/19 1626     05/19/19 1600  isoniazid (NYDRAZID) tablet 300 mg     300 mg Oral Daily 05/19/19 1428     05/19/19 1600  rifampin (RIFADIN) capsule 600 mg     600 mg Oral Daily 05/19/19 1428     05/19/19 1600  pyrazinamide tablet 1,000 mg  Status:  Discontinued     1,000 mg Oral Daily 05/19/19 1428 05/23/19 1626   05/19/19 1600  ethambutol (MYAMBUTOL) tablet 800 mg  Status:  Discontinued     800 mg Oral Daily 05/19/19 1428 05/23/19 1626   05/18/19 0000  vancomycin (VANCOCIN) IVPB 1000 mg/200 mL premix  Status:  Discontinued     1,000 mg 200 mL/hr over 60 Minutes Intravenous Every 36 hours 05/16/19 1219 05/17/19 1058   05/16/19 1130  vancomycin (VANCOCIN) 1,250 mg in sodium chloride 0.9 % 250 mL IVPB     1,250 mg 166.7 mL/hr over 90 Minutes Intravenous  Once 05/16/19 1100 05/16/19 1404   05/16/19 1000  ampicillin-sulbactam (UNASYN) 1.5 g in sodium chloride 0.9 % 100 mL IVPB     1.5 g 200 mL/hr over 30 Minutes Intravenous Every 6 hours 05/16/19 0952 05/23/19 2359        Objective:   Vitals:   05/24/19 2019 05/25/19 0500 05/25/19 1034 05/25/19 1228  BP: (!) 144/73 (!) 157/80  (!) 142/81  Pulse: 81 79  92  Resp: 16 14  18   Temp: 99.6 F (37.6 C) (!) 97 F (36.1 C)    TempSrc:  SpO2: 100% 99% 99% 97%  Weight:  62.8 kg    Height:        Wt Readings from Last 3 Encounters:  05/25/19 62.8 kg  03/23/19 73.9 kg  03/21/18 78.5 kg     Intake/Output Summary (Last 24 hours) at 05/25/2019 1247 Last data filed at 05/25/2019 1100 Gross per 24  hour  Intake 460 ml  Output 1475 ml  Net -1015 ml     Physical Exam NAD HEENT: pallor+ moist mucosa, supple neck  chest; clear  B/l  cvs : NS1&S2 GI: soft, NT, ND  musculoskeletal: warm, no edema     Data Review:    CBC Recent Labs  Lab 05/19/19 0713 05/24/19 0446  WBC 6.7 7.2  HGB 9.7* 8.4*  HCT 34.5* 29.9*  PLT 212 162  MCV 81.0 80.2  MCH 22.8* 22.5*  MCHC 28.1* 28.1*  RDW 17.6* 17.1*    Chemistries  Recent Labs  Lab 05/21/19 0456 05/22/19 0153 05/23/19 0209 05/23/19 1908 05/24/19 0446 05/25/19 0445  NA 146* 144  --  142 143 140  K 2.6* 2.9*  --  3.1* 3.2* 3.4*  CL 105 103  --  103 104 102  CO2 31 29  --  30 30 32  GLUCOSE 76 86  --  132* 94 92  BUN 39* 41*  --  48* 43* 40*  CREATININE 1.63* 1.62*  --  1.97* 1.70* 1.40*  CALCIUM 8.9 8.5*  --  8.7* 8.7* 8.6*  MG  --  1.9 2.2  --   --   --   AST  --   --   --   --  27  --   ALT  --   --   --   --  13  --   ALKPHOS  --   --   --   --  80  --   BILITOT  --   --   --   --  0.5  --    ------------------------------------------------------------------------------------------------------------------ No results for input(s): CHOL, HDL, LDLCALC, TRIG, CHOLHDL, LDLDIRECT in the last 72 hours.  Lab Results  Component Value Date   HGBA1C 6.3 (H) 05/16/2019   ------------------------------------------------------------------------------------------------------------------ No results for input(s): TSH, T4TOTAL, T3FREE, THYROIDAB in the last 72 hours.  Invalid input(s): FREET3 ------------------------------------------------------------------------------------------------------------------ No results for input(s): VITAMINB12, FOLATE, FERRITIN, TIBC, IRON, RETICCTPCT in the last 72 hours.  Coagulation profile No results for input(s): INR, PROTIME in the last 168 hours.  No results for input(s): DDIMER in the last 72 hours.  Cardiac Enzymes No results for input(s): CKMB, TROPONINI, MYOGLOBIN in the last  168 hours.  Invalid input(s): CK ------------------------------------------------------------------------------------------------------------------    Component Value Date/Time   BNP 995.9 (H) 05/19/2019 1759    Inpatient Medications  Scheduled Meds:  buPROPion  150 mg Oral Daily   enoxaparin (LOVENOX) injection  40 mg Subcutaneous Q24H   ethambutol  1,200 mg Oral Daily   feeding supplement (ENSURE ENLIVE)  237 mL Oral Q24H   ipratropium  2 puff Inhalation TID   isoniazid  300 mg Oral Daily   And   vitamin B-6  50 mg Oral Daily   levalbuterol  2 puff Inhalation TID   mouth rinse  15 mL Mouth Rinse BID   mometasone-formoterol  2 puff Inhalation BID   multivitamin with minerals  1 tablet Oral Daily   pantoprazole  40 mg Oral Q0600   potassium chloride  40 mEq Oral Daily   pravastatin  20  mg Oral q1800   pyrazinamide  1,500 mg Oral Daily   rifampin  600 mg Oral Daily   thiamine  100 mg Oral Daily   Continuous Infusions:  sodium chloride Stopped (05/23/19 0250)   PRN Meds:.sodium chloride, acetaminophen **OR** acetaminophen, alum & mag hydroxide-simeth, dextrose, HYDROcodone-acetaminophen, loperamide, ondansetron **OR** ondansetron (ZOFRAN) IV, phenol, Resource ThickenUp Clear  Micro Results Recent Results (from the past 240 hour(s))  SARS Coronavirus 2 [facility](Hospital order, Performed in [facility] hospital lab) Nasopharyngeal Nasopharyngeal Swab     Status: None   Collection Time: 05/15/19  5:03 PM   Specimen: Nasopharyngeal Swab  Result Value Ref Range Status   SARS Coronavirus 2 NEGATIVE NEGATIVE Final    Comment: (NOTE) If result is NEGATIVE SARS-CoV-2 target nucleic acids are NOT DETECTED. The SARS-CoV-2 RNA is generally detectable in upper and lower  respiratory specimens during the acute phase of infection. The lowest  concentration of SARS-CoV-2 viral copies this assay can detect is 250  copies / mL. A negative result does not preclude SARS-CoV-2  infection  and should not be used as the sole basis for treatment or other  patient management decisions.  A negative result may occur with  improper specimen collection / handling, submission of specimen other  than nasopharyngeal swab, presence of viral mutation(s) within the  areas targeted by this assay, and inadequate number of viral copies  (<250 copies / mL). A negative result must be combined with clinical  observations, patient history, and epidemiological information. If result is POSITIVE SARS-CoV-2 target nucleic acids are DETECTED. The SARS-CoV-2 RNA is generally detectable in upper and lower  respiratory specimens dur ing the acute phase of infection.  Positive  results are indicative of active infection with SARS-CoV-2.  Clinical  correlation with patient history and other diagnostic information is  necessary to determine patient infection status.  Positive results do  not rule out bacterial infection or co-infection with other viruses. If result is PRESUMPTIVE POSTIVE SARS-CoV-2 nucleic acids MAY BE PRESENT.   A presumptive positive result was obtained on the submitted specimen  and confirmed on repeat testing.  While 2019 novel coronavirus  (SARS-CoV-2) nucleic acids may be present in the submitted sample  additional confirmatory testing may be necessary for epidemiological  and / or clinical management purposes  to differentiate between  SARS-CoV-2 and other Sarbecovirus currently known to infect humans.  If clinically indicated additional testing with an alternate test  methodology [phone]) is advised. The SARS-CoV-2 RNA is generally  detectable in upper and lower respiratory sp ecimens during the acute  phase of infection. The expected result is Negative. Fact Sheet for Patients:  BoilerBrush.com.cyhttps://www.fda.gov/media/136312/download Fact Sheet for Healthcare Providers: https://pope.com/https://www.fda.gov/media/136313/download This test is not yet approved or cleared by the [place]  FDA and has been authorized for detection and/or diagnosis of SARS-CoV-2 by FDA under an Emergency Use Authorization (EUA).  This EUA will remain in effect (meaning this test can be used) for the duration of the COVID-19 declaration under Section 564(b)(1) of the Act, 21 U.S.C. section 360bbb-3(b)(1), unless the authorization is terminated or revoked sooner. Performed at [facility], [address] [place], [place] [id number]   Culture, blood (Routine X 2) w Reflex to ID Panel     Status: None   Collection Time: 05/16/19  1:07 AM   Specimen: BLOOD  Result Value Ref Range Status   Specimen Description   Final    BLOOD LEFT ANTECUBITAL Performed at [facility], [address] [place], [place] [address]    Special Requests   Final    BOTTLES DRAWN AEROBIC AND ANAEROBIC Blood Culture adequate volume Performed at [facility], [address] [place], [place] [address]    Culture   Final    NO GROWTH 5 DAYS Performed at [facility], [address] [place], [place] [address]    Report Status 05/21/2019 FINAL  Final  Culture, blood (Routine X 2) w Reflex to ID Panel     Status: None   Collection Time: 05/16/19  1:07 AM   Specimen: BLOOD  Result Value Ref Range Status   Specimen Description   Final    BLOOD RIGHT ANTECUBITAL Performed at [facility], [address], [place], [place] [address]    Special Requests   Final    BOTTLES DRAWN AEROBIC AND ANAEROBIC Blood Culture results may not be optimal due to an excessive volume of blood received in culture bottles Performed at [facility], [address] [place], [place] [address]    Culture   Final    NO GROWTH 5 DAYS Performed at [facility], [address] [place], [place] [address]    Report Status 05/21/2019 FINAL  Final  Acid Fast Smear (AFB)     Status: Abnormal   Collection Time: 05/16/19  2:16 AM   Specimen:  Sputum  Result Value Ref Range Status   AFB Specimen Processing Concentration  Final   Acid Fast Smear Positive (A)  Final    Comment: (NOTE) 2+, 4-36 acid-fast bacilli per 10 fields at 400X magnification, fluorescent smear NOTIFIED KATHLEEN ON 05/17/19 AT 15:17 DS FAXED TO [phone] Performed At: [facility] [address] [place], [place] [id number] Jolene Schimke MD NU:[id number]    Source (AFB) SPUTUM  Final    Comment: Performed at [facility], [address] [place], [place] [address]  Acid Fast Smear (AFB)     Status: None   Collection Time: 05/16/19 12:07 PM   Specimen: Sputum  Result Value Ref Range Status   AFB Specimen Processing Concentration  Final   Acid Fast Smear Positive  Final    Comment: CRITICAL RESULT CALLED TO, READ BACK BY AND VERIFIED WITH: RITA,SAKAYI AT 0350 ON 05/19/19 BY A,MOHAMED (NOTE) 3+, 4-36 acid-fast bacilli per field at 400X magnification, fluorescent smear NOTIFIED KATHLEEN ON 05/17/19 AT 15:17 DS FAXED TO [phone] Performed At: [facility] [address] [place], [place] [id number] Jolene Schimke MD ZY:[id number]    Source (AFB) SPUTUM  Final    Comment: Performed at [facility], [address] [place], [place] [address]  MRSA PCR Screening     Status: None   Collection Time: 05/16/19 12:07 PM   Specimen: Nasal Mucosa; Nasopharyngeal  Result Value Ref Range Status   MRSA by PCR NEGATIVE NEGATIVE Final    Comment:        The GeneXpert MRSA Assay (FDA approved for NASAL specimens only), is one component of a comprehensive MRSA colonization surveillance program. It is not intended to diagnose MRSA infection nor to guide or monitor treatment for MRSA infections. Performed at [facility], [address] [place], [place] [address]   Expectorated sputum assessment w rflx to resp cult     Status: None   Collection Time: 05/16/19 12:46 PM   Specimen:  SPU  Result Value Ref Range Status   Specimen Description SPUTUM  Final  Special Requests NONE  Final   Sputum evaluation   Final    THIS SPECIMEN IS ACCEPTABLE FOR SPUTUM CULTURE Performed at [facility] [address], [place], [place] [address]    Report Status 05/16/2019 FINAL  Final  Culture, respiratory     Status: None   Collection Time: 05/16/19 12:46 PM   Specimen: SPU  Result Value Ref Range Status   Specimen Description   Final    SPUTUM Performed at [facility] [address] [place], [place] [address]    Special Requests   Final    NONE Reflexed from [phone] Performed at [place] [address] [place], [place] [address]    Gram Stain   Final    MODERATE WBC PRESENT,BOTH PMN AND MONONUCLEAR MODERATE GRAM POSITIVE COCCI FEW GRAM VARIABLE ROD    Culture   Final    FEW Consistent with normal respiratory flora. Performed at [facility], [place] [address], [place], [place] [address]    Report Status 05/18/2019 FINAL  Final  Acid Fast Smear (AFB)     Status: Abnormal   Collection Time: 05/17/19  8:08 PM   Specimen: Sputum  Result Value Ref Range Status   AFB Specimen Processing Concentration  Final   Acid Fast Smear Positive (A)  Final    Comment: (NOTE) 3+, 4-36 acid-fast bacilli per field at 400X magnification, fluorescent smear Positive smear reported to Aisha M. on 05/19/19 at 3:34 A.M by BMS. Performed At: [facility] [place], [place] [id number] Rush Farmer MD TD:[id number]    Source (AFB) SPUTUM  Final    Comment: Performed at [facility], [place] [address], [place], [place] [address]    Radiology Reports Dg Chest 2 View  Result Date: 05/15/2019 CLINICAL DATA:  EMS reports from home, lives alone, friend called for failure to thrive for three months. Weakness and recent fall this a.m. Smoker. Productive cough. Hx of Cardiomegaly and Type  II Diabetes. EXAM: CHEST - 2 VIEW COMPARISON:  Chest CT, 09/07/2011 FINDINGS: There are numerous ill-defined bilateral lung nodules, more numerous on the left, with intervening hazy ground-glass type opacities. Lungs are hyperexpanded. There is an emphysematous bleb in the right upper lobe measuring approximately 6 cm. No pleural effusion.  No pneumothorax. Cardiac silhouette is normal in size.  No mediastinal masses. Skeletal structures are intact. IMPRESSION: 1. Multiple bilateral ill-defined nodular type opacities with intervening ground-glass opacities, greater throughout the left lung than on the right. Findings may be due to multifocal infection or inflammation. Metastatic disease is also in the differential diagnosis. Recommend follow-up assessment with chest CT with contrast. Electronically Signed   By: Lajean Manes M.D.   On: 05/15/2019 19:24   Ct Head Wo Contrast  Result Date: 05/17/2019 CLINICAL DATA:  Head trauma with ataxia EXAM: CT HEAD WITHOUT CONTRAST TECHNIQUE: Contiguous axial images were obtained from the base of the skull through the vertex without intravenous contrast. COMPARISON:  08/27/2008 head CT FINDINGS: Brain: No evidence of acute infarction, hemorrhage, hydrocephalus, extra-axial collection or mass lesion/mass effect. Mild motion artifact of the ventral brain causing duplicated appearance of the falx. Generalized atrophy, central predominant, progressed from 2009. Chronic small vessel ischemia that is confluent in the deep white matter-progressive. Remote lacunar infarct in the left thalamus. Vascular: Atherosclerotic calcification. The calcification has occurred at the right MCA and basilar since prior. Skull: Negative for fracture Sinuses/Orbits: No evidence of injury IMPRESSION: 1. No  evidence of intracranial injury. 2. Atrophy and chronic small vessel ischemia with progression from 2009. 3. Mild motion artifact. Electronically Signed   By: Marnee Spring M.D.   On: 05/17/2019  04:55   Ct Chest W Contrast  Result Date: 05/15/2019 CLINICAL DATA:  Dyspnea, chronic, failure to thrive EXAM: CT CHEST WITH CONTRAST TECHNIQUE: Multidetector CT imaging of the chest was performed during intravenous contrast administration. CONTRAST:  60mL OMNIPAQUE IOHEXOL 300 MG/ML  SOLN COMPARISON:  Same day chest radiograph, CT chest 09/07/2011 FINDINGS: Cardiovascular: Normal heart size. No pericardial effusion. Atherosclerotic plaque within the normal caliber aorta. Atherosclerotic calcification of the coronary arteries. Calcifications of the mitral annulus Mediastinum/Nodes: No enlarged mediastinal or axillary lymph nodes. Thyroid gland, trachea, and esophagus demonstrate no significant findings. Lungs/Pleura: Widespread randomly distributed, apically predominant consolidative opacities with central cavitation are present throughout both hemithoraces. A larger thick-walled cystic lesion is present in the right upper lobe measuring up to 6.3 cm in size. This larger focus corresponds well to an area ground-glass opacity on CT from 2012. No pneumothorax. No effusion. Upper Abdomen: Atrophic appearance of the left kidney. Few fluid attenuation cyst in the right kidney. Dependently layering gallstone within the otherwise normal gallbladder. Small hiatal hernia. Accessory splenule. Subcentimeter hypoattenuating foci in the spleen too small to fully characterize on CT imaging but statistically likely benign. Musculoskeletal: Multilevel degenerative changes are present in the imaged portions of the spine. IMPRESSION: Widespread nodular and cavitary lesions throughout both lungs with a larger air-filled cystic lesion in the right upper lobe. Overall appearance is nonspecific and differential could include atypical infection such as tuberculosis which requires exclusion versus other considerations including cavitating malignancy such as diffuse squamous cell or septic pulmonary emboli. Sputum cultures and tissue  sampling may provide a more definitive diagnosis. Aortic Atherosclerosis (ICD10-I70.0). Electronically Signed   By: Kreg Shropshire M.D.   On: 05/15/2019 20:39   Dg Chest Port 1 View  Result Date: 05/19/2019 CLINICAL DATA:  Hypoxia. EXAM: PORTABLE CHEST 1 VIEW COMPARISON:  Radiographs and CT scan of May 15, 2019. FINDINGS: Stable cardiomediastinal silhouette. No pneumothorax is noted. There are again noted multiple ill-defined nodular opacities throughout the left lung which may be slightly more prominent. Is uncertain if these represent multifocal pneumonia or possibly metastatic disease or other malignancy. The largest such abnormality is seen in the left lung apex. Minimal interstitial densities are noted in the right lung which may represent inflammation or scarring. Bony thorax is unremarkable. IMPRESSION: There are again noted multiple ill-defined nodular densities throughout the left lung which may be slightly more prominent compared to prior exam. Differential includes multifocal pneumonia or inflammation, or possibly metastatic disease or other malignancy. Electronically Signed   By: Lupita Raider M.D.   On: 05/19/2019 09:58   Vas [place] Lower Extremity Venous (dvt)  Result Date: 05/16/2019  Lower Venous Study Indications: Edema.  Limitations: Patient positioning. Comparison Study: no prior Performing Technologist: Blanch Media RVS  Examination Guidelines: A complete evaluation includes B-mode imaging, spectral Doppler, color Doppler, and power Doppler as needed of all accessible portions of each vessel. Bilateral testing is considered an integral part of a complete examination. Limited examinations for reoccurring indications may be performed as noted.  +---------+---------------+---------+-----------+----------+-------+  RIGHT     Compressibility Phasicity Spontaneity Properties Summary  +---------+---------------+---------+-----------+----------+-------+  CFV       Full            Yes       Yes                              +---------+---------------+---------+-----------+----------+-------+  SFJ       Full                                                      +---------+---------------+---------+-----------+----------+-------+  FV Prox   Full                                                      +---------+---------------+---------+-----------+----------+-------+  FV Mid    Full                                                      +---------+---------------+---------+-----------+----------+-------+  FV Distal Full                                                      +---------+---------------+---------+-----------+----------+-------+  PFV       Full                                                      +---------+---------------+---------+-----------+----------+-------+  POP       Full            Yes       Yes                             +---------+---------------+---------+-----------+----------+-------+  PTV       Full                                                      +---------+---------------+---------+-----------+----------+-------+  PERO      Full                                                      +---------+---------------+---------+-----------+----------+-------+     Summary: Right: There is no evidence of deep vein thrombosis in the lower extremity. No cystic structure found in the popliteal fossa.  *See table(s) above for measurements and observations. Electronically signed by Coral Else MD on 05/16/2019 at 8:43:11 PM.    Final     Time Spent in minutes 35   Nahal Wanless M.D on 05/25/2019 at 12:47 PM  Between 7am to 7pm - Pager - [phone]  After 7pm go to [email or web address] - password [facility], [place]  [facility] -  Office  [phone]

## 2019-05-25 NOTE — Progress Notes (Signed)
Objective Swallowing Evaluation: Type of Study: Bedside Swallow Evaluation   Patient Details  Name: Clarence LaosRobert M Stender MRN: [id number] Date of Birth: Nov 05, 1945  Today's Date: 05/25/2019 Time: SLP Start Time (ACUTE ONLY): 1515 -SLP Stop Time (ACUTE ONLY): 1600  SLP Time Calculation (min) (ACUTE ONLY): 45 min   Past Medical History:  Past Medical History:  Diagnosis Date  . Abnormal CT of the chest 2009   nodule 9 x 6 mm RUL  . Benign localized hyperplasia of prostate with urinary obstruction and other lower urinary tract symptoms (LUTS)(600.21)   . Cardiomegaly   . Cerebrovascular accident (stroke) (HCC) 2009   lacunar infarct in the left thalamus  . Cholelithiasis   . Chronic airway obstruction, not elsewhere classified   . Chronic kidney disease, stage III (moderate) (HCC)   . Closed femur fracture (HCC) 1960  . Illiterate   . Obesity, unspecified   . Other and unspecified hyperlipidemia   . Tobacco use disorder   . Type II or unspecified type diabetes mellitus with renal manifestations, uncontrolled(250.42)   . Unspecified essential hypertension    Past Surgical History:  Past Surgical History:  Procedure Laterality Date  . FEMUR FRACTURE SURGERY Left [id number]   HPI: 73 year old male admitted 05/15/2019 with progressive weakness and cough, failure to thrive. Pt lives alone. PMH: cardiomegaly, CVA (2009), COPD, HTN, DM, obesity, CKD III, tobacco abuse. Lung sounds: some scattered rhonchi. Course rhonchus cough.  CXR = bilateral infiltrates. Chest CT = bilateral diffuse nodular infiltrates with cavitation and a large right upper lobe cystic lesion   Subjective: pt sitting in chair in flouro suite    Assessment / Plan / Recommendation  CH[facility] IP CLINICAL IMPRESSIONS 05/25/2019  Clinical Impression Patient presents with moderate oropharyngeal dysphagia with sensorimotor deficits.  Oral control impaired due to weakness/discoordination resulting in lingual pumping, premature spillage  and oral residuals.    Hypomotility noted in pharynx impacting tongue base retraction causing impaired epiglottic deflection and decreased pharyngeal contraction with decreased laryngeal elevation.  These all resulted in patient having trace aspiration of thin x2 - mixed with secretions and mild aspiration of nectar.  Trace aspiration with thin was silent but patient did reflexively cough with aspiration of nectar.      Patient airway protection improved with sequential swallows of thin as this allowed improved and extended laryngeal closure/elevation.  Various postures including chin tuck, head turn left/right did not improve pharyngeal clearance nor airway protection.      Pt likely experiences low grade chronic aspiration due to his oropharyngeal dysphagia.  Cues to clear his throat and re-swallow were helpful to clear majority of penetration.  Pharyngeal residuals worse with increased visocity and thus do not recommend honey thick liquids. Of note, pt was given several boluses of crackers to eat during study without flouro to increase pharyngeal residuals.      Due to level of oral deficits *exacerbated likely by absence of denture*, recommend continue CREAMY pureed foods and thin liquids.  Prefer patient consumes water with his meals and other liquids between meals.    Using live feed, educated patient to recommendations.  Will follow up for dysphagia management.      Recommend follow up SLP at next venue of care to facilitate dysphagia management and improve swallow function.  SLP Visit Diagnosis Dysphagia, oropharyngeal phase (R13.12)  Attention and concentration deficit following --  Frontal lobe and executive function deficit following --  Impact on safety and function Moderate aspiration risk;Risk for inadequate nutrition/hydration  CHL IP TREATMENT RECOMMENDATION 05/25/2019  Treatment Recommendations Therapy as outlined in treatment plan below     Prognosis 05/16/2019  Prognosis  for Safe Diet Advancement Fair  Barriers to Reach Goals --  Barriers/Prognosis Comment --    [facility] IP DIET RECOMMENDATION 05/25/2019  SLP Diet Recommendations Dysphagia 1 (Puree) solids;Thin liquid  Liquid Administration via Cup;Straw  Medication Administration Crushed with puree  Compensations Slow rate;Small sips/bites;Minimize environmental distractions  Postural Changes --      CH[facility] IP OTHER RECOMMENDATIONS 05/25/2019  Recommended Consults --  Oral Care Recommendations Oral care QID  Other Recommendations Order thickener from pharmacy      CH[facility] IP FOLLOW UP RECOMMENDATIONS 05/25/2019  Follow up Recommendations Skilled Nursing facility      [facility] IP FREQUENCY AND DURATION 05/25/2019  Speech Therapy Frequency (ACUTE ONLY) min 1 x/week  Treatment Duration 2 weeks;1 week           CH[facility] IP ORAL PHASE 05/25/2019  Oral Phase Impaired  Oral - Pudding Teaspoon --  Oral - Pudding Cup --  Oral - Honey Teaspoon --  Oral - Honey Cup --  Oral - Nectar Teaspoon NT  Oral - Nectar Cup --  Oral - Nectar Straw Lingual pumping;Weak lingual manipulation;Piecemeal swallowing;Delayed oral transit;Decreased bolus cohesion;Premature spillage;Reduced posterior propulsion  Oral - Thin Teaspoon Weak lingual manipulation;Lingual pumping;Reduced posterior propulsion;Piecemeal swallowing;Premature spillage;Decreased bolus cohesion  Oral - Thin Cup Decreased bolus cohesion;Premature spillage;Piecemeal swallowing;Weak lingual manipulation;Reduced posterior propulsion;Lingual pumping  Oral - Thin Straw Decreased bolus cohesion;Premature spillage;Lingual pumping;Weak lingual manipulation;Piecemeal swallowing;Reduced posterior propulsion  Oral - Puree Weak lingual manipulation;Lingual pumping;Reduced posterior propulsion;Delayed oral transit;Premature spillage;Lingual/palatal residue  Oral - Mech Soft Weak lingual manipulation;Impaired mastication;Lingual pumping;Reduced posterior propulsion;Lingual/palatal  residue;Delayed oral transit;Decreased bolus cohesion;Premature spillage  Oral - Regular --  Oral - Multi-Consistency --  Oral - Pill --  Oral Phase - Comment --    CH[facility] IP PHARYNGEAL PHASE 05/25/2019  Pharyngeal Phase Impaired  Pharyngeal- Pudding Teaspoon --  Pharyngeal --  Pharyngeal- Pudding Cup --  Pharyngeal --  Pharyngeal- Honey Teaspoon --  Pharyngeal --  Pharyngeal- Honey Cup --  Pharyngeal --  Pharyngeal- Nectar Teaspoon --  Pharyngeal --  Pharyngeal- Nectar Cup Delayed swallow initiation-vallecula;Reduced epiglottic inversion;Reduced laryngeal elevation;Reduced airway/laryngeal closure;Reduced tongue base retraction;Penetration/Aspiration during swallow;Penetration/Apiration after swallow;Lateral channel residue;Pharyngeal residue - valleculae;Pharyngeal residue - pyriform;Compensatory strategies attempted (with notebox)  Pharyngeal Material enters airway, remains ABOVE vocal cords and not ejected out  Pharyngeal- Nectar Straw Reduced epiglottic inversion;Reduced laryngeal elevation;Reduced airway/laryngeal closure;Reduced tongue base retraction;Trace aspiration;Pharyngeal residue - pyriform;Lateral channel residue;Penetration/Apiration after swallow  Pharyngeal Material enters airway, remains ABOVE vocal cords and not ejected out;Material enters airway, passes BELOW cords and not ejected out despite cough attempt by patient  Pharyngeal- Thin Teaspoon Delayed swallow initiation-vallecula;Penetration/Aspiration during swallow;Reduced epiglottic inversion;Reduced laryngeal elevation;Reduced airway/laryngeal closure;Reduced tongue base retraction  Pharyngeal Material enters airway, remains ABOVE vocal cords and not ejected out  Pharyngeal- Thin Cup Penetration/Apiration after swallow;Penetration/Aspiration during swallow;Reduced epiglottic inversion;Reduced laryngeal elevation;Reduced airway/laryngeal closure;Reduced tongue base retraction;Pharyngeal residue - valleculae;Pharyngeal  residue - pyriform  Pharyngeal Material enters airway, remains ABOVE vocal cords and not ejected out  Pharyngeal- Thin Straw Reduced epiglottic inversion;Reduced laryngeal elevation;Reduced airway/laryngeal closure;Reduced tongue base retraction;Penetration/Aspiration during swallow;Penetration/Apiration after swallow  Pharyngeal Material enters airway, passes BELOW cords without attempt by patient to eject out (silent aspiration);Material enters airway, remains ABOVE vocal cords and not ejected out  Pharyngeal- Puree Delayed swallow initiation-vallecula;Reduced pharyngeal peristalsis;Reduced epiglottic inversion;Reduced tongue base retraction;Pharyngeal residue - valleculae;Pharyngeal residue - pyriform;Pharyngeal  residue - posterior pharnyx  Pharyngeal Material does not enter airway  Pharyngeal- Mechanical Soft Reduced pharyngeal peristalsis;Reduced epiglottic inversion;Reduced laryngeal elevation;Reduced tongue base retraction;Pharyngeal residue - valleculae;Pharyngeal residue - pyriform;Lateral channel residue  Pharyngeal Material does not enter airway  Pharyngeal- Regular --  Pharyngeal --  Pharyngeal- Multi-consistency --  Pharyngeal --  Pharyngeal- Pill --  Pharyngeal --  Pharyngeal Comment chin tuck nor head turn right or left were helpful to protect airway, pt airway protection improves with sequential liquid swallows as this allows sustained laryngeal elevation/closure; trace aspiration of thin occured after swallow due to pyriform sinus residuals mixing with secretions and spilling into open larynx; mild aspiration of nectar occured with 2nd swallow of the study as penetrates moved into trachea and pyriform sinus residual spilled into posterior trach; pt does not sense residuals; he did demonstrate reflexive cough with aspiration of nectar but not with very trace amounts; pt likely with low grade chronic aspiration; cued throat clear and dry swallow helpful to decrease residuals     CHL  IP CERVICAL ESOPHAGEAL PHASE 05/25/2019  Cervical Esophageal Phase WFL  Pudding Teaspoon --  Pudding Cup --  Honey Teaspoon --  Honey Cup --  Nectar Teaspoon --  Nectar Cup --  Nectar Straw --  Thin Teaspoon --  Thin Cup --  Thin Straw --  Puree --  Mechanical Soft --  Regular --  Multi-consistency --  Pill --  Cervical Esophageal Comment --    Luanna Salk, MS [facility] SLP Acute Rehab Services Pager [phone] Office [phone]  [name] 05/25/2019, 7:03 PM

## 2019-05-26 DIAGNOSIS — J9601 Acute respiratory failure with hypoxia: Secondary | ICD-10-CM

## 2019-05-26 DIAGNOSIS — R1312 Dysphagia, oropharyngeal phase: Secondary | ICD-10-CM

## 2019-05-26 LAB — GLUCOSE, CAPILLARY
Glucose-Capillary: 109 mg/dL — ABNORMAL HIGH (ref 70–99)
Glucose-Capillary: 116 mg/dL — ABNORMAL HIGH (ref 70–99)
Glucose-Capillary: 131 mg/dL — ABNORMAL HIGH (ref 70–99)
Glucose-Capillary: 178 mg/dL — ABNORMAL HIGH (ref 70–99)
Glucose-Capillary: 80 mg/dL (ref 70–99)
Glucose-Capillary: 85 mg/dL (ref 70–99)

## 2019-05-26 LAB — BASIC METABOLIC PANEL
Anion gap: 11 (ref 5–15)
BUN: 35 mg/dL — ABNORMAL HIGH (ref 8–23)
CO2: 29 mmol/L (ref 22–32)
Calcium: 8.8 mg/dL — ABNORMAL LOW (ref 8.9–10.3)
Chloride: 101 mmol/L (ref 98–111)
Creatinine, Ser: 1.35 mg/dL — ABNORMAL HIGH (ref 0.61–1.24)
GFR calc Af Amer: >60 mL/min (ref >60)
GFR calc non Af Amer: 52 mL/min — ABNORMAL LOW (ref >60)
Glucose, Bld: 93 mg/dL (ref 70–99)
Potassium: 3.5 mmol/L (ref 3.5–5.1)
Sodium: 141 mmol/L (ref 135–145)

## 2019-05-26 MED ORDER — ZOLPIDEM TARTRATE 5 MG PO TABS
5.0000 mg | ORAL_TABLET | Freq: Every evening | ORAL | Status: DC | PRN
  Administered 2019-05-27 – 2019-06-13 (×11): 5 mg via ORAL
  Filled 2019-05-26 (×11): qty 1

## 2019-05-26 NOTE — Progress Notes (Signed)
PROGRESS NOTE                                                                                                                                                                                                             Patient Demographics:    Clarence Maldonado, is a 73 y.o. male, DOB - Feb 19, 1946, [id number]  Admit date - 05/15/2019   Admitting Physician Therisa Doyne, MD  Outpatient Primary MD for the patient is [name], DO  LOS - 11  Outpatient Specialists: None  Chief Complaint  Patient presents with   Failure To Thrive   Fatigue       Brief Narrative   73 year old male with history of stroke, COPD, CKD stage III, tobacco use, diabetes mellitus type 2, hypertension presented to the ED with progressive weakness for past 3 months with frequent falls.  He called EMS after a fall.  Reported poor p.o. intake and losing almost 43 pounds in the past 2 months.  No prior history of TB or exposure.  In the ED COVID-19 was tested negative.  CT of the chest showed multiple cavitary lesion suggestive of malignancy versus TB/atypical infection. Sputum for AFB and QuantiFERON gold positive.  MTB PCR also tested positive.  Hospital course complicated with patient developing acute hypoxic respiratory failure on 8/11 with ABG showing significant hypoxemia and hypercapnia (PO2 57 and PCO2 45).  Placed on empiric IV antibiotics.  Also found to have significant dysphagia.     Subjective:  Stable overnight.  Desatted to mid 80s on room air and stable on 2 L via nasal cannula.   Assessment  & Plan :    Principal Problem: Acute respiratory failure with hypoxia (HCC) Secondary to pulmonary TB (MTB PCR positive).  Also had aspiration pneumonia.  ID consult appreciated and started on 4 drug therapy (rifampin, isoniazid by acetamide and ethambutol for 2 months to cover both latent and presumed active TB. continue B6 and  thiamine.  Currently maintaining sats on 2 L via nasal cannula.  (Required stepdown monitoring with NRB transiently). Continue airborne isolation. Wean oxygen as tolerated.  Completed antibiotics for aspiration pneumonia.  Patient is extremely deconditioned and high fall risk. Needs SNF. He agrees for SNF but may be difficult placement due to requirement for airborne isolation room. Social worker has signed out referrals.  Active Problems:  Dysphagia Moderate oropharyngeal dysphagia with sensorimotor deficit noted on MBS also noted hypomotility of the pharynx.  High aspiration risk.  Continue dysphagia level 1 diet.  ?  Mass in left atrium on 2D echo Unable to rule out vegetation.  Comments on severe thickening of the mitral valve leaflet with severe calcification with severe and bulky posterior annular calcification.  Subcostal images suggest mobile mass in the right atrium.  Cardiology consulted and was discussed with Dr. Delton SeeNelson who reviewed the echo and suggested the mass might be reflective of nonspecific congenital abnormality.  Recommends she will arrange outpatient follow-up once pulmonary issues resolve.  Nonsustained V. tach In the setting of persistent hypokalemia and hypomagnesemia.  Replenished.  Stable on telemetry. Continue daily kcl supplement   Iron deficiency anemia Low iron panel, normal B12 (483). Hemoccult negative. Add iron supplement if no interaction with ATT  Type 2 diabetes mellitus, controlled Secondary to severe weight loss and poor p.o. intake.  A1c of 6.3.  Monitor on sliding scale coverage only.  Adequate p.o. intake encouraged   Weight loss with protein calorie malnutrition, Secondary to underlying TB and failure to thrive.  Nutrition and PT consult appreciated.  Needs SNF.   Chronic kidney disease stage 3 Stable. Avoid nephrotoxins    Code Status : full  Family Communication  : none  Disposition Plan  : SNF once bed available.  Barriers For  Discharge : active symptoms  Consults  : ID, pulmonary  Procedures  : CT chest  DVT Prophylaxis  :  Lovenox -   Lab Results  Component Value Date   PLT 162 05/24/2019    Antibiotics  :    Anti-infectives (From admission, onward)   Start     Dose/Rate Route Frequency Ordered Stop   05/24/19 1000  pyrazinamide tablet 1,500 mg     1,500 mg Oral Daily 05/23/19 1626     05/24/19 1000  ethambutol (MYAMBUTOL) tablet 1,200 mg     1,200 mg Oral Daily 05/23/19 1626     05/19/19 1600  isoniazid (NYDRAZID) tablet 300 mg     300 mg Oral Daily 05/19/19 1428     05/19/19 1600  rifampin (RIFADIN) capsule 600 mg     600 mg Oral Daily 05/19/19 1428     05/19/19 1600  pyrazinamide tablet 1,000 mg  Status:  Discontinued     1,000 mg Oral Daily 05/19/19 1428 05/23/19 1626   05/19/19 1600  ethambutol (MYAMBUTOL) tablet 800 mg  Status:  Discontinued     800 mg Oral Daily 05/19/19 1428 05/23/19 1626   05/18/19 0000  vancomycin (VANCOCIN) IVPB 1000 mg/200 mL premix  Status:  Discontinued     1,000 mg 200 mL/hr over 60 Minutes Intravenous Every 36 hours 05/16/19 1219 05/17/19 1058   05/16/19 1130  vancomycin (VANCOCIN) 1,250 mg in sodium chloride 0.9 % 250 mL IVPB     1,250 mg 166.7 mL/hr over 90 Minutes Intravenous  Once 05/16/19 1100 05/16/19 1404   05/16/19 1000  ampicillin-sulbactam (UNASYN) 1.5 g in sodium chloride 0.9 % 100 mL IVPB     1.5 g 200 mL/hr over 30 Minutes Intravenous Every 6 hours 05/16/19 0952 05/23/19 2359        Objective:   Vitals:   05/26/19 1125 05/26/19 1130 05/26/19 1136 05/26/19 1316  BP:    133/82  Pulse:    (!) 112  Resp:    17  Temp:    98.2 F (36.8 C)  TempSrc:  Oral  SpO2: 96% (!) 88% 97% 96%  Weight:      Height:        Wt Readings from Last 3 Encounters:  05/26/19 63 kg  03/23/19 73.9 kg  03/21/18 78.5 kg     Intake/Output Summary (Last 24 hours) at 05/26/2019 1358 Last data filed at 05/26/2019 1135 Gross per 24 hour  Intake 1528 ml  Output  1300 ml  Net 228 ml     Physical Exam NAD HEENT: pallor+ moist mucosa, supple neck  chest; clear  B/l  cvs : NS1&S2 GI: soft, NT, ND  musculoskeletal: warm, no edema     Data Review:    CBC Recent Labs  Lab 05/24/19 0446  WBC 7.2  HGB 8.4*  HCT 29.9*  PLT 162  MCV 80.2  MCH 22.5*  MCHC 28.1*  RDW 17.1*    Chemistries  Recent Labs  Lab 05/22/19 0153 05/23/19 0209 05/23/19 1908 05/24/19 0446 05/25/19 0445 05/26/19 0420  NA 144  --  142 143 140 141  K 2.9*  --  3.1* 3.2* 3.4* 3.5  CL 103  --  103 104 102 101  CO2 29  --  30 30 32 29  GLUCOSE 86  --  132* 94 92 93  BUN 41*  --  48* 43* 40* 35*  CREATININE 1.62*  --  1.97* 1.70* 1.40* 1.35*  CALCIUM 8.5*  --  8.7* 8.7* 8.6* 8.8*  MG 1.9 2.2  --   --   --   --   AST  --   --   --  27  --   --   ALT  --   --   --  13  --   --   ALKPHOS  --   --   --  80  --   --   BILITOT  --   --   --  0.5  --   --    ------------------------------------------------------------------------------------------------------------------ No results for input(s): CHOL, HDL, LDLCALC, TRIG, CHOLHDL, LDLDIRECT in the last 72 hours.  Lab Results  Component Value Date   HGBA1C 6.3 (H) 05/16/2019   ------------------------------------------------------------------------------------------------------------------ No results for input(s): TSH, T4TOTAL, T3FREE, THYROIDAB in the last 72 hours.  Invalid input(s): FREET3 ------------------------------------------------------------------------------------------------------------------ No results for input(s): VITAMINB12, FOLATE, FERRITIN, TIBC, IRON, RETICCTPCT in the last 72 hours.  Coagulation profile No results for input(s): INR, PROTIME in the last 168 hours.  No results for input(s): DDIMER in the last 72 hours.  Cardiac Enzymes No results for input(s): CKMB, TROPONINI, MYOGLOBIN in the last 168 hours.  Invalid input(s):  CK ------------------------------------------------------------------------------------------------------------------    Component Value Date/Time   BNP 995.9 (H) 05/19/2019 1759    Inpatient Medications  Scheduled Meds:  buPROPion  150 mg Oral Daily   enoxaparin (LOVENOX) injection  40 mg Subcutaneous Q24H   ethambutol  1,200 mg Oral Daily   feeding supplement (ENSURE ENLIVE)  237 mL Oral Q24H   ferrous sulfate  325 mg Oral BID WC   ipratropium  2 puff Inhalation TID   isoniazid  300 mg Oral Daily   And   vitamin B-6  50 mg Oral Daily   levalbuterol  2 puff Inhalation TID   mouth rinse  15 mL Mouth Rinse BID   mometasone-formoterol  2 puff Inhalation BID   multivitamin with minerals  1 tablet Oral Daily   pantoprazole  40 mg Oral Q0600   potassium chloride  40 mEq Oral Daily   pravastatin  20 mg Oral q1800   pyrazinamide  1,500 mg Oral Daily   rifampin  600 mg Oral Daily   thiamine  100 mg Oral Daily   zolpidem  5 mg Oral QHS   Continuous Infusions:  sodium chloride Stopped (05/23/19 0250)   PRN Meds:.sodium chloride, acetaminophen **OR** acetaminophen, alum & mag hydroxide-simeth, dextrose, HYDROcodone-acetaminophen, loperamide, ondansetron **OR** ondansetron (ZOFRAN) IV, phenol, Resource ThickenUp Clear  Micro Results Recent Results (from the past 240 hour(s))  Acid Fast Smear (AFB)     Status: Abnormal   Collection Time: 05/17/19  8:08 PM   Specimen: Sputum  Result Value Ref Range Status   AFB Specimen Processing Concentration  Final   Acid Fast Smear Positive (A)  Final    Comment: (NOTE) 3+, 4-36 acid-fast bacilli per field at 400X magnification, fluorescent smear Positive smear reported to Aisha M. on 05/19/19 at 3:34 A.M by BMS. Performed At: [facility] [place], [place] [id number] Rush Farmer MD ZD:[id number]    Source (AFB) SPUTUM  Final    Comment: Performed at [facility] [place]  [address] [place], [place] [address]    Radiology Reports Dg Chest 2 View  Result Date: 05/15/2019 CLINICAL DATA:  EMS reports from home, lives alone, friend called for failure to thrive for three months. Weakness and recent fall this a.m. Smoker. Productive cough. Hx of Cardiomegaly and Type II Diabetes. EXAM: CHEST - 2 VIEW COMPARISON:  Chest CT, 09/07/2011 FINDINGS: There are numerous ill-defined bilateral lung nodules, more numerous on the left, with intervening hazy ground-glass type opacities. Lungs are hyperexpanded. There is an emphysematous bleb in the right upper lobe measuring approximately 6 cm. No pleural effusion.  No pneumothorax. Cardiac silhouette is normal in size.  No mediastinal masses. Skeletal structures are intact. IMPRESSION: 1. Multiple bilateral ill-defined nodular type opacities with intervening ground-glass opacities, greater throughout the left lung than on the right. Findings may be due to multifocal infection or inflammation. Metastatic disease is also in the differential diagnosis. Recommend follow-up assessment with chest CT with contrast. Electronically Signed   By: Lajean Manes M.D.   On: 05/15/2019 19:24   Ct Head Wo Contrast  Result Date: 05/17/2019 CLINICAL DATA:  Head trauma with ataxia EXAM: CT HEAD WITHOUT CONTRAST TECHNIQUE: Contiguous axial images were obtained from the base of the skull through the vertex without intravenous contrast. COMPARISON:  08/27/2008 head CT FINDINGS: Brain: No evidence of acute infarction, hemorrhage, hydrocephalus, extra-axial collection or mass lesion/mass effect. Mild motion artifact of the ventral brain causing duplicated appearance of the falx. Generalized atrophy, central predominant, progressed from 2009. Chronic small vessel ischemia that is confluent in the deep white matter-progressive. Remote lacunar infarct in the left thalamus. Vascular: Atherosclerotic calcification. The calcification has occurred at the right MCA and  basilar since prior. Skull: Negative for fracture Sinuses/Orbits: No evidence of injury IMPRESSION: 1. No evidence of intracranial injury. 2. Atrophy and chronic small vessel ischemia with progression from 2009. 3. Mild motion artifact. Electronically Signed   By: Monte Fantasia M.D.   On: 05/17/2019 04:55   Ct Chest W Contrast  Result Date: 05/15/2019 CLINICAL DATA:  Dyspnea, chronic, failure to thrive EXAM: CT CHEST WITH CONTRAST TECHNIQUE: Multidetector CT imaging of the chest was performed during intravenous contrast administration. CONTRAST:  32mL OMNIPAQUE IOHEXOL 300 MG/ML  SOLN COMPARISON:  Same day chest radiograph, CT chest 09/07/2011 FINDINGS: Cardiovascular: Normal heart size. No pericardial effusion. Atherosclerotic plaque within the normal caliber aorta. Atherosclerotic calcification  of the coronary arteries. Calcifications of the mitral annulus Mediastinum/Nodes: No enlarged mediastinal or axillary lymph nodes. Thyroid gland, trachea, and esophagus demonstrate no significant findings. Lungs/Pleura: Widespread randomly distributed, apically predominant consolidative opacities with central cavitation are present throughout both hemithoraces. A larger thick-walled cystic lesion is present in the right upper lobe measuring up to 6.3 cm in size. This larger focus corresponds well to an area ground-glass opacity on CT from 2012. No pneumothorax. No effusion. Upper Abdomen: Atrophic appearance of the left kidney. Few fluid attenuation cyst in the right kidney. Dependently layering gallstone within the otherwise normal gallbladder. Small hiatal hernia. Accessory splenule. Subcentimeter hypoattenuating foci in the spleen too small to fully characterize on CT imaging but statistically likely benign. Musculoskeletal: Multilevel degenerative changes are present in the imaged portions of the spine. IMPRESSION: Widespread nodular and cavitary lesions throughout both lungs with a larger air-filled cystic lesion  in the right upper lobe. Overall appearance is nonspecific and differential could include atypical infection such as tuberculosis which requires exclusion versus other considerations including cavitating malignancy such as diffuse squamous cell or septic pulmonary emboli. Sputum cultures and tissue sampling may provide a more definitive diagnosis. Aortic Atherosclerosis (ICD10-I70.0). Electronically Signed   By: Kreg Shropshire M.D.   On: 05/15/2019 20:39   Dg Chest Port 1 View  Result Date: 05/19/2019 CLINICAL DATA:  Hypoxia. EXAM: PORTABLE CHEST 1 VIEW COMPARISON:  Radiographs and CT scan of May 15, 2019. FINDINGS: Stable cardiomediastinal silhouette. No pneumothorax is noted. There are again noted multiple ill-defined nodular opacities throughout the left lung which may be slightly more prominent. Is uncertain if these represent multifocal pneumonia or possibly metastatic disease or other malignancy. The largest such abnormality is seen in the left lung apex. Minimal interstitial densities are noted in the right lung which may represent inflammation or scarring. Bony thorax is unremarkable. IMPRESSION: There are again noted multiple ill-defined nodular densities throughout the left lung which may be slightly more prominent compared to prior exam. Differential includes multifocal pneumonia or inflammation, or possibly metastatic disease or other malignancy. Electronically Signed   By: Lupita Raider M.D.   On: 05/19/2019 09:58   Dg Swallowing Func-speech Pathology  Result Date: 05/25/2019 CLINICAL DATA:  Dysphagia EXAM: ESOPHOGRAM/BARIUM SWALLOW.  Swallowing function TECHNIQUE: A swallowing function exam under fluoroscopy was performed under the direction of speech pathologist utilizing multiple consistencies of barium to assess the swallowing mechanism. Single contrast examination was performed using  thin barium. FLUOROSCOPY TIME:  Fluoroscopy Time:  4.8 minutes Radiation Exposure Index (if provided by  the fluoroscopic device): 44.6 mGy Number of Acquired Spot Images: 0 full exposures COMPARISON:  None. FINDINGS: Swallow study: Penetration with trace aspiration with thin liquids. Please refer to speech language pathology note for a full detailed report. Esophagram: A single contrast esophagram was performed with patient seated in a chair following a swallow study. Intermittent tertiary contractions were observed under intermittent fluoroscopy. Evaluation of fine mucosal detail was limited secondary to difficulties with patient positioning and single-contrast technique. No persisting structures. No obvious mucosal ulcerations. Small hiatal hernia. A 13 mm barium tablet traversed the esophagus into the stomach without delay. IMPRESSION: 1. Trace aspiration with thin liquids on swallow study. Please refer to speech language pathology note for full detailed report. 2. Limited single-contrast esophagram without persisting stricture or obvious mucosal abnormality. 3. Small hiatal hernia. Electronically Signed   By: Duanne Guess M.D.   On: 05/25/2019 16:48   Vas [place] Lower Extremity Venous (dvt)  Result Date: 05/16/2019  Lower Venous Study Indications: Edema.  Limitations: Patient positioning. Comparison Study: no prior Performing Technologist: Blanch MediaMegan Riddle RVS  Examination Guidelines: A complete evaluation includes B-mode imaging, spectral Doppler, color Doppler, and power Doppler as needed of all accessible portions of each vessel. Bilateral testing is considered an integral part of a complete examination. Limited examinations for reoccurring indications may be performed as noted.  +---------+---------------+---------+-----------+----------+-------+  RIGHT     Compressibility Phasicity Spontaneity Properties Summary  +---------+---------------+---------+-----------+----------+-------+  CFV       Full            Yes       Yes                              +---------+---------------+---------+-----------+----------+-------+  SFJ       Full                                                      +---------+---------------+---------+-----------+----------+-------+  FV Prox   Full                                                      +---------+---------------+---------+-----------+----------+-------+  FV Mid    Full                                                      +---------+---------------+---------+-----------+----------+-------+  FV Distal Full                                                      +---------+---------------+---------+-----------+----------+-------+  PFV       Full                                                      +---------+---------------+---------+-----------+----------+-------+  POP       Full            Yes       Yes                             +---------+---------------+---------+-----------+----------+-------+  PTV       Full                                                      +---------+---------------+---------+-----------+----------+-------+  PERO      Full                                                      +---------+---------------+---------+-----------+----------+-------+  Summary: Right: There is no evidence of deep vein thrombosis in the lower extremity. No cystic structure found in the popliteal fossa.  *See table(s) above for measurements and observations. Electronically signed by Coral Else MD on 05/16/2019 at 8:43:11 PM.    Final    Dg Esophagus W Single Cm (sol Or Thin Ba)  Result Date: 05/25/2019 CLINICAL DATA:  Dysphagia EXAM: ESOPHOGRAM/BARIUM SWALLOW.  Swallowing function TECHNIQUE: A swallowing function exam under fluoroscopy was performed under the direction of speech pathologist utilizing multiple consistencies of barium to assess the swallowing mechanism. Single contrast examination was performed using  thin barium. FLUOROSCOPY TIME:  Fluoroscopy Time:  4.8 minutes Radiation Exposure Index (if provided by the  fluoroscopic device): 44.6 mGy Number of Acquired Spot Images: 0 full exposures COMPARISON:  None. FINDINGS: Swallow study: Penetration with trace aspiration with thin liquids. Please refer to speech language pathology note for a full detailed report. Esophagram: A single contrast esophagram was performed with patient seated in a chair following a swallow study. Intermittent tertiary contractions were observed under intermittent fluoroscopy. Evaluation of fine mucosal detail was limited secondary to difficulties with patient positioning and single-contrast technique. No persisting structures. No obvious mucosal ulcerations. Small hiatal hernia. A 13 mm barium tablet traversed the esophagus into the stomach without delay. IMPRESSION: 1. Trace aspiration with thin liquids on swallow study. Please refer to speech language pathology note for full detailed report. 2. Limited single-contrast esophagram without persisting stricture or obvious mucosal abnormality. 3. Small hiatal hernia. Electronically Signed   By: Duanne Guess M.D.   On: 05/25/2019 16:48    Time Spent in minutes 35   Tammie Yanda M.D on 05/26/2019 at 1:58 PM  Between 7am to 7pm - Pager - [phone]  After 7pm go to [email or web address] - password [facility]  [facility] -  Office  [phone]

## 2019-05-26 NOTE — Progress Notes (Signed)
Physical Therapy Treatment Patient Details Name: Clarence Maldonado MRN: [id number] DOB: [birth year] Today's Date: 05/26/2019    History of Present Illness 73 y.o. male with medical history significant of cardiomegaly, stroke, COPD,   CKD stage III, tobacco abuse, DM 2, HTN who presented to the emergency room on 8/10 with progressive weakness over the last 3 months to the point where he was falling over. He has lost approximately 43 pounds in the past 2 months. CT scan of chest noted multiple cavitary lesions of unclear etiology with a differential diagnosis of malignancy versus TB versus atypical infection.    PT Comments    Pt very motivated to try to get stronger to be able to DC home as his plan. Very sad and frustrated when I entered due to misunderstanding that he would have to stay in the hospital for 6 months due to TB. Talked to Dr. Baxter Flattery and his TB plan may be that long, but if we work to get him stronger we could get him home safely. I discussed thi with the patient. Worked with small bouts of exercises in 10  Rep intervals due to very little casues SOB. Worked with sit to stand and static hold for 10 second intervals 2x times and stand pivot to from 3N1. This took a lot out of the patient however 0 2 sats on 2L [place] stayed in 97-98%. HR increased and Dyspnea did as well, and educated pt about recovery time and short bouts of simple exercises through the day. Also shared this with nursing to even encourage 10 UE sets while giving meds to get pt stronger.   Will continue to follow while here.     Follow Up Recommendations  SNF(due to TB pt likley will not be able to go to SNF, so will see pt as much as possible for a DC home plan.)     Equipment Recommendations  None recommended by PT    Recommendations for Other Services       Precautions / Restrictions Precautions Precautions: Fall Precaution Comments: multiple falls Restrictions Weight Bearing Restrictions: No    Mobility  Bed Mobility Overal bed mobility: (NT pt up in recliner)             General bed mobility comments: light assistance for trunk  Transfers Overall transfer level: Needs assistance Equipment used: Rolling walker (2 wheeled) Transfers: Sit to/from Stand Sit to Stand: Mod assist         General transfer comment: sit to stand from recliner and 3n! 4 times. needed Mod A to boost from that level of recliner and 3N1. Once standing more of Min A for static standing at RW . Worked on stand pivot with some small steps and marching in place 5x . seated rest breaks required to " catch his breathe" after each movement or stand  Ambulation/Gait                 Stairs             Wheelchair Mobility    Modified Rankin (Stroke Patients Only)       Balance Overall balance assessment: Independent Sitting-balance support: Bilateral upper extremity supported Sitting balance-Leahy Scale: Good     Standing balance support: Bilateral upper extremity supported;During functional activity Standing balance-Leahy Scale: Fair                              Cognition Arousal/Alertness: Awake/alert Behavior  During Therapy: WFL for tasks assessed/performed                                          Exercises Other Exercises Other Exercises: educated with need for small exercises throughout the day. Begin with showing him 10x arm reaches for the sky, and 10x BLE with knee extension in sitting hold 2 seconds . Also educated nursing to try to encourage these when in room giving meds, etc.    General Comments        Pertinent Vitals/Pain Pain Assessment: No/denies pain    Home Living                      Prior Function            PT Goals (current goals can now be found in the care plan section) Acute Rehab PT Goals Patient Stated Goal: I want to get stronger so I can go home. I don't want to be her for 6 months becaseu of TB PT Goal  Formulation: With patient Time For Goal Achievement: 06/19/19 Potential to Achieve Goals: Good Progress towards PT goals: Progressing toward goals    Frequency    Min 3X/week      PT Plan Current plan remains appropriate    Co-evaluation              AM-PAC PT "6 Clicks" Mobility   Outcome Measure  Help needed turning from your back to your side while in a flat bed without using bedrails?: A Little Help needed moving from lying on your back to sitting on the side of a flat bed without using bedrails?: A Little Help needed moving to and from a bed to a chair (including a wheelchair)?: A Lot Help needed standing up from a chair using your arms (e.g., wheelchair or bedside chair)?: A Lot Help needed to walk in hospital room?: Total Help needed climbing 3-5 steps with a railing? : Total 6 Click Score: 12    End of Session Equipment Utilized During Treatment: Gait belt Activity Tolerance: Patient limited by fatigue Patient left: in bed;with call bell/phone within reach;with nursing/sitter in room(alerted nursing that pt does NOT have a chair alarm in his recliner (thi si how I found him) however I feel like he needs one. Shanda Bumps told me she would place one on him in next few minutes she had to go give meds.) Nurse Communication: Mobility status(Got him an extender on his O2 to help with mobility in room with nursing and PT /OT) PT Visit Diagnosis: Unsteadiness on feet (R26.81);Muscle weakness (generalized) (M62.81);Difficulty in walking, not elsewhere classified (R26.2);Adult, failure to thrive (R62.7);History of falling (Z91.81);Repeated falls (R29.6)     Time: 1655-3748 PT Time Calculation (min) (ACUTE ONLY): 36 min  Charges:  $Therapeutic Exercise: 8-22 mins $Therapeutic Activity: 8-22 mins                     Marella Bile, PT Acute Rehabilitation Services Pager: [phone] Office: [phone] 05/26/2019    Marella Bile 05/26/2019, 4:52 PM

## 2019-05-26 NOTE — Progress Notes (Signed)
[facility] for Infectious Disease    Date of Admission:  05/15/2019      ID: Clarence Maldonado is a 73 y.o. male with pulmonary TB and wasting Principal Problem:   Aspiration pneumonia ([place]) Active Problems:   Essential hypertension   CKD (chronic kidney disease) stage 3, GFR 30-59 ml/min (HCC)   Hyperlipidemia   Tobacco use disorder   Controlled type 2 diabetes mellitus with stage 3 chronic kidney disease, without long-term current use of insulin (HCC)   Weight loss   Hyperlipidemia associated with type 2 diabetes mellitus ([place])   Cavitary lesion of lung   Hypokalemia   Cavitary pneumonia   Anemia    Subjective: Still has productive cough. Poor sleep and wishes to have sleeping aid  Medications:  . buPROPion  150 mg Oral Daily  . enoxaparin (LOVENOX) injection  40 mg Subcutaneous Q24H  . ethambutol  1,200 mg Oral Daily  . feeding supplement (ENSURE ENLIVE)  237 mL Oral Q24H  . ferrous sulfate  325 mg Oral BID WC  . ipratropium  2 puff Inhalation TID  . isoniazid  300 mg Oral Daily   And  . vitamin B-6  50 mg Oral Daily  . levalbuterol  2 puff Inhalation TID  . mouth rinse  15 mL Mouth Rinse BID  . mometasone-formoterol  2 puff Inhalation BID  . multivitamin with minerals  1 tablet Oral Daily  . pantoprazole  40 mg Oral Q0600  . potassium chloride  40 mEq Oral Daily  . pravastatin  20 mg Oral q1800  . pyrazinamide  1,500 mg Oral Daily  . rifampin  600 mg Oral Daily  . thiamine  100 mg Oral Daily  . zolpidem  5 mg Oral QHS    Objective: Vital signs in last 24 hours: Temp:  [97 F (36.1 C)-98.2 F (36.8 C)] 98.2 F (36.8 C) (08/21 1316) Pulse Rate:  [88-120] 120 (08/21 1636) Resp:  [16-18] 17 (08/21 1316) BP: (133-157)/(78-82) 133/82 (08/21 1316) SpO2:  [88 %-98 %] 96 % (08/21 1636) Weight:  [19 kg] 63 kg (08/21 0440) Physical Exam  Constitutional: He is oriented to person, place, and time. He appears cachetic. No distress.  HENT:  Mouth/Throat:  Oropharynx is clear and moist. No oropharyngeal exudate.  Cardiovascular: Normal rate, regular rhythm and normal heart sounds. Exam reveals no gallop and no friction rub.  No murmur heard.  Pulmonary/Chest: Effort normal and breath sounds normal. No respiratory distress. He has no wheezes.  Abdominal: Soft. Bowel sounds are normal. He exhibits no distension. There is no tenderness.  Lymphadenopathy:  He has no cervical adenopathy.  Ext: thin extremities, bony prominences noted on lower extremities Neurological: He is alert and oriented to person, place, and time.  Skin: Skin is warm and dry. No rash noted. No erythema.  Psychiatric: He has a normal mood and affect. His behavior is normal.     Lab Results Recent Labs    05/24/19 0446 05/25/19 0445 05/26/19 0420  WBC 7.2  --   --   HGB 8.4*  --   --   HCT 29.9*  --   --   NA 143 140 141  K 3.2* 3.4* 3.5  CL 104 102 101  CO2 30 32 29  BUN 43* 40* 35*  CREATININE 1.70* 1.40* 1.35*   Liver Panel Recent Labs    05/24/19 0446  PROT 5.2*  ALBUMIN 1.7*  AST 27  ALT 13  ALKPHOS 80  BILITOT 0.5  BILIDIR 0.1  IBILI 0.4    Microbiology: +MTB probe Studies/Results: Dg Swallowing Func-speech Pathology  Result Date: 05/25/2019 CLINICAL DATA:  Dysphagia EXAM: ESOPHOGRAM/BARIUM SWALLOW.  Swallowing function TECHNIQUE: A swallowing function exam under fluoroscopy was performed under the direction of speech pathologist utilizing multiple consistencies of barium to assess the swallowing mechanism. Single contrast examination was performed using  thin barium. FLUOROSCOPY TIME:  Fluoroscopy Time:  4.8 minutes Radiation Exposure Index (if provided by the fluoroscopic device): 44.6 mGy Number of Acquired Spot Images: 0 full exposures COMPARISON:  None. FINDINGS: Swallow study: Penetration with trace aspiration with thin liquids. Please refer to speech language pathology note for a full detailed report. Esophagram: A single contrast esophagram  was performed with patient seated in a chair following a swallow study. Intermittent tertiary contractions were observed under intermittent fluoroscopy. Evaluation of fine mucosal detail was limited secondary to difficulties with patient positioning and single-contrast technique. No persisting structures. No obvious mucosal ulcerations. Small hiatal hernia. A 13 mm barium tablet traversed the esophagus into the stomach without delay. IMPRESSION: 1. Trace aspiration with thin liquids on swallow study. Please refer to speech language pathology note for full detailed report. 2. Limited single-contrast esophagram without persisting stricture or obvious mucosal abnormality. 3. Small hiatal hernia. Electronically Signed   By: Duanne Guess M.D.   On: 05/25/2019 16:48   Dg Esophagus W Single Cm (sol Or Thin Ba)  Result Date: 05/25/2019 CLINICAL DATA:  Dysphagia EXAM: ESOPHOGRAM/BARIUM SWALLOW.  Swallowing function TECHNIQUE: A swallowing function exam under fluoroscopy was performed under the direction of speech pathologist utilizing multiple consistencies of barium to assess the swallowing mechanism. Single contrast examination was performed using  thin barium. FLUOROSCOPY TIME:  Fluoroscopy Time:  4.8 minutes Radiation Exposure Index (if provided by the fluoroscopic device): 44.6 mGy Number of Acquired Spot Images: 0 full exposures COMPARISON:  None. FINDINGS: Swallow study: Penetration with trace aspiration with thin liquids. Please refer to speech language pathology note for a full detailed report. Esophagram: A single contrast esophagram was performed with patient seated in a chair following a swallow study. Intermittent tertiary contractions were observed under intermittent fluoroscopy. Evaluation of fine mucosal detail was limited secondary to difficulties with patient positioning and single-contrast technique. No persisting structures. No obvious mucosal ulcerations. Small hiatal hernia. A 13 mm barium  tablet traversed the esophagus into the stomach without delay. IMPRESSION: 1. Trace aspiration with thin liquids on swallow study. Please refer to speech language pathology note for full detailed report. 2. Limited single-contrast esophagram without persisting stricture or obvious mucosal abnormality. 3. Small hiatal hernia. Electronically Signed   By: Duanne Guess M.D.   On: 05/25/2019 16:48     Assessment/Plan: Dysphagia= has soft diet per speech  Insomnia = consider sleep aid   Pulmonary mTB = continue to crush pills with apple sauce to give RIPE plus vit b6 daily  Underweight/undernourished = continue with supplementation  Debility = to be with PT daily Will see back on monday  [facility] for Infectious Diseases Cell: [phone] Pager: [phone]  05/26/2019, 5:39 PM

## 2019-05-26 NOTE — Care Management Important Message (Signed)
Important Message  Patient Details IM Letter given to [name] RN to present to the Patient Name: Clarence Maldonado MRN: [id number] Date of Birth: [birth year]   Medicare Important Message Given:  Yes     Kerin Salen 05/26/2019, 10:39 AM

## 2019-05-26 NOTE — Plan of Care (Signed)
  Problem: Nutrition: Goal: Adequate nutrition will be maintained Outcome: Progressing   Problem: Safety: Goal: Ability to remain free from injury will improve Outcome: Progressing   Problem: Skin Integrity: Goal: Risk for impaired skin integrity will decrease Outcome: Progressing   

## 2019-05-26 NOTE — TOC Progression Note (Signed)
Transition of Care [facility]) - Progression Note    Patient Details  Name: Clarence Maldonado MRN: [id number] Date of Birth: [birth year]  Transition of Care [facility]) CM/SW [place], LCSW Phone Number: 05/26/2019, 11:51 AM  Clinical Narrative:   CSW spoke with patient over the phone. Patient stated he lives home alone with his dog. Patient stated he is now agreeable to discharge to a a SNF. Patient stated his hoping "the facility can help with his walking" CSW went over the process of SNF with patient and also went over insurance coverage. CSW will follow up with patient once bed overs are available     Expected Discharge Plan: [place] Barriers to Discharge: Continued Medical Work up  Expected Discharge Plan and Services Expected Discharge Plan: [place]   Discharge Planning Services: CM Consult   Living arrangements for the past 2 months: Mobile Home                                       Social Determinants of Health (SDOH) Interventions    Readmission Risk Interventions Readmission Risk Prevention Plan 05/23/2019  Transportation Screening Complete  PCP or Specialist Appt within 3-5 Days Not Complete  Not Complete comments Not ready for dc  HRI or [place] Complete  Social Work Consult for [place] Planning/Counseling Not Complete  SW consult not completed comments nA  Palliative Care Screening Not Applicable  Medication Review Press photographer) Complete  Some recent data might be hidden

## 2019-05-26 NOTE — NC FL2 (Signed)
[place] MEDICAID FL2 LEVEL OF CARE SCREENING TOOL     IDENTIFICATION  Patient Name: Clarence Maldonado Birthdate: 1946/03/18 Sex: male Admission Date (Current Location): 05/15/2019  [facility] and [place] Number:  Producer, television/film/video and Address:  [facility] [place],  [address] [place] [address]      Provider Number: [id number]  Attending Physician Name and Address:  Eddie North, MD  Relative Name and Phone Number:  [name] [name],[phone]    Current Level of Care: Hospital Recommended Level of Care: Skilled Nursing Facility Prior Approval Number:    Date Approved/Denied:   PASRR Number: [id number]  Discharge Plan: SNF    Current Diagnoses: Patient Active Problem List   Diagnosis Date Noted  . Aspiration pneumonia (HCC) 05/16/2019  . Cavitary lesion of lung 05/15/2019  . Hypokalemia 05/15/2019  . Cavitary pneumonia 05/15/2019  . Anemia 05/15/2019  . Hypermetropia of both eyes 03/21/2018  . Weight loss 03/21/2018  . Hyperlipidemia associated with type 2 diabetes mellitus (HCC) 03/21/2018  . Insomnia 02/03/2017  . Cognitive change 01/29/2016  . Illiterate 01/03/2014  . Abnormal CT scan, chest 01/03/2014  . Controlled type 2 diabetes mellitus with stage 3 chronic kidney disease, without long-term current use of insulin (HCC) 01/05/2013  . Stranguria 01/05/2013  . Essential hypertension 01/04/2013  . CKD (chronic kidney disease) stage 3, GFR 30-59 ml/min (HCC) 01/04/2013  . Hyperlipidemia 01/04/2013  . Tobacco use disorder 01/04/2013  . BPH without obstruction/lower urinary tract symptoms 01/04/2013    Orientation RESPIRATION BLADDER Height & Weight     Self, Time, Situation, Place  O2(2L) Continent Weight: 139 lb (63 kg) Height:  5\' 6"  (167.6 cm)  BEHAVIORAL SYMPTOMS/MOOD NEUROLOGICAL BOWEL NUTRITION STATUS      Continent Diet(dys 1)  AMBULATORY STATUS COMMUNICATION OF NEEDS Skin   Limited Assist Verbally                          Personal Care Assistance Level of Assistance  Bathing, Feeding, Dressing Bathing Assistance: Limited assistance Feeding assistance: Independent Dressing Assistance: Limited assistance     Functional Limitations Info  Sight, Hearing, Speech Sight Info: Adequate Hearing Info: Adequate Speech Info: Adequate    SPECIAL CARE FACTORS FREQUENCY  PT (By licensed PT), OT (By licensed OT), Speech therapy     PT Frequency: 5x wk OT Frequency: 5x wk     Speech Therapy Frequency: 3x wk      Contractures Contractures Info: Not present    Additional Factors Info  Code Status, Allergies Code Status Info: full code Allergies Info: no known allergies           Current Medications (05/26/2019):  This is the current hospital active medication list Current Facility-Administered Medications  Medication Dose Route Frequency Provider Last Rate Last Dose  . 0.9 %  sodium chloride infusion   Intravenous PRN Darlin Drop, DO   Stopped at 05/23/19 0250  . acetaminophen (TYLENOL) tablet 650 mg  650 mg Oral Q6H PRN Therisa Doyne, MD   650 mg at 05/16/19 1704   Or  . acetaminophen (TYLENOL) suppository 650 mg  650 mg Rectal Q6H PRN Therisa Doyne, MD      . alum & mag hydroxide-simeth (MAALOX/MYLANTA) 200-200-20 MG/5ML suspension 30 mL  30 mL Oral Q4H PRN Dow Adolph N, DO   30 mL at 05/22/19 2115  . buPROPion (WELLBUTRIN XL) 24 hr tablet 150 mg  150 mg Oral Daily Therisa Doyne, MD  150 mg at 05/26/19 1051  . dextrose 50 % solution 50 mL  1 ampule Intravenous PRN Leda GauzeKirby-Graham, Karen J, NP   50 mL at 05/19/19 0539  . enoxaparin (LOVENOX) injection 40 mg  40 mg Subcutaneous Q24H Hall, Carole N, DO   40 mg at 05/26/19 1113  . ethambutol (MYAMBUTOL) tablet 1,200 mg  1,200 mg Oral Daily Judyann MunsonSnider, Cynthia, MD   1,200 mg at 05/26/19 1052  . feeding supplement (ENSURE ENLIVE) (ENSURE ENLIVE) liquid 237 mL  237 mL Oral Q24H Alessandra BevelsKamineni, Neelima, MD   237 mL at 05/26/19 1052  . ferrous  sulfate tablet 325 mg  325 mg Oral BID WC Dhungel, Nishant, MD   325 mg at 05/26/19 1050  . HYDROcodone-acetaminophen (NORCO/VICODIN) 5-325 MG per tablet 1-2 tablet  1-2 tablet Oral Q4H PRN Doutova, Anastassia, MD      . ipratropium (ATROVENT HFA) inhaler 2 puff  2 puff Inhalation TID Dow AdolphHall, Carole N, DO   2 puff at 05/26/19 1054  . isoniazid (NYDRAZID) tablet 300 mg  300 mg Oral Daily Judyann MunsonSnider, Cynthia, MD   300 mg at 05/26/19 1049   And  . pyridOXINE (VITAMIN B-6) tablet 50 mg  50 mg Oral Daily Judyann MunsonSnider, Cynthia, MD   50 mg at 05/26/19 1049  . levalbuterol (XOPENEX HFA) inhaler 2 puff  2 puff Inhalation TID Dow AdolphHall, Carole N, DO   2 puff at 05/26/19 1053  . loperamide (IMODIUM) capsule 2 mg  2 mg Oral PRN Alessandra BevelsKamineni, Neelima, MD   2 mg at 05/23/19 0845  . MEDLINE mouth rinse  15 mL Mouth Rinse BID Hollice EspyKrishnan, Sendil K, MD   15 mL at 05/26/19 1052  . mometasone-formoterol (DULERA) 200-5 MCG/ACT inhaler 2 puff  2 puff Inhalation BID Dow AdolphHall, Carole N, DO   2 puff at 05/26/19 1055  . multivitamin with minerals tablet 1 tablet  1 tablet Oral Daily Hollice EspyKrishnan, Sendil K, MD   1 tablet at 05/26/19 1050  . ondansetron (ZOFRAN) tablet 4 mg  4 mg Oral Q6H PRN Therisa Doyneoutova, Anastassia, MD       Or  . ondansetron (ZOFRAN) injection 4 mg  4 mg Intravenous Q6H PRN Therisa Doyneoutova, Anastassia, MD   4 mg at 05/21/19 2028  . pantoprazole (PROTONIX) EC tablet 40 mg  40 mg Oral Q0600 Judyann MunsonSnider, Cynthia, MD   40 mg at 05/26/19 0606  . phenol (CHLORASEPTIC) mouth spray 1 spray  1 spray Mouth/Throat PRN Hollice EspyKrishnan, Sendil K, MD   1 spray at 05/22/19 2122  . potassium chloride SA (K-DUR) CR tablet 40 mEq  40 mEq Oral Daily Dhungel, Nishant, MD   40 mEq at 05/26/19 1051  . pravastatin (PRAVACHOL) tablet 20 mg  20 mg Oral q1800 Therisa Doyneoutova, Anastassia, MD   20 mg at 05/26/19 0116  . pyrazinamide tablet 1,500 mg  1,500 mg Oral Daily Judyann MunsonSnider, Cynthia, MD   1,500 mg at 05/26/19 1049  . Resource ThickenUp Clear   Oral PRN Hollice EspyKrishnan, Sendil K, MD      . rifampin  (RIFADIN) capsule 600 mg  600 mg Oral Daily Judyann MunsonSnider, Cynthia, MD   600 mg at 05/26/19 1050  . thiamine (VITAMIN B-1) tablet 100 mg  100 mg Oral Daily Len ChildsBell, Michelle T, RPH   100 mg at 05/26/19 1052  . zolpidem (AMBIEN) tablet 5 mg  5 mg Oral QHS Dhungel, Nishant, MD   5 mg at 05/26/19 0116     Discharge Medications: Please see discharge summary for a list of discharge medications.  Relevant Imaging Results:  Relevant Lab Results:   Additional Information SS# [id number], LCSW

## 2019-05-27 DIAGNOSIS — R1311 Dysphagia, oral phase: Secondary | ICD-10-CM

## 2019-05-27 LAB — GLUCOSE, CAPILLARY
Glucose-Capillary: 106 mg/dL — ABNORMAL HIGH (ref 70–99)
Glucose-Capillary: 122 mg/dL — ABNORMAL HIGH (ref 70–99)
Glucose-Capillary: 139 mg/dL — ABNORMAL HIGH (ref 70–99)
Glucose-Capillary: 149 mg/dL — ABNORMAL HIGH (ref 70–99)
Glucose-Capillary: 171 mg/dL — ABNORMAL HIGH (ref 70–99)
Glucose-Capillary: 256 mg/dL — ABNORMAL HIGH (ref 70–99)
Glucose-Capillary: 86 mg/dL (ref 70–99)

## 2019-05-27 NOTE — Progress Notes (Signed)
PROGRESS NOTE                                                                                                                                                                                                             Patient Demographics:    Clarence DittyRobert Maldonado, is a 73 y.o. male, DOB - 01-13-1946, ZOX:[id number]  Admit date - 05/15/2019   Admitting Physician Therisa DoyneAnastassia Doutova, MD  Outpatient Primary MD for the patient is Kermit Baloeed, Tiffany L, DO  LOS - 12  Outpatient Specialists: None  Chief Complaint  Patient presents with   Failure To Thrive   Fatigue       Brief Narrative   73 year old male with history of stroke, COPD, CKD stage III, tobacco use, diabetes mellitus type 2, hypertension presented to the ED with progressive weakness for past 3 months with frequent falls.  He called EMS after a fall.  Reported poor p.o. intake and losing almost 43 pounds in the past 2 months.  No prior history of TB or exposure.  In the ED COVID-19 was tested negative.  CT of the chest showed multiple cavitary lesion suggestive of malignancy versus TB/atypical infection. Sputum for AFB and QuantiFERON gold positive.  MTB PCR also tested positive.  Hospital course complicated with patient developing acute hypoxic respiratory failure on 8/11 with ABG showing significant hypoxemia and hypercapnia (PO2 57 and PCO2 45).  Placed on empiric IV antibiotics.  Also found to have significant dysphagia.     Subjective:  No overnight events.  Reports feeling better and wants to get stronger to go home.   Assessment  & Plan :    Principal Problem: Acute respiratory failure with hypoxia (HCC) Secondary to pulmonary TB (MTB PCR positive).  Also had aspiration pneumonia.  ID consult appreciated and started on 4 drug therapy (rifampin, isoniazid by acetamide and ethambutol for 2 months to cover both latent and presumed active TB. continue B6 and thiamine.   Health department notified.  Currently maintaining sats on 2 L via nasal cannula.  (Required stepdown monitoring with NRB transiently). Continue airborne isolation. Wean oxygen as tolerated.  Completed antibiotics for aspiration pneumonia.  Patient is extremely deconditioned and high fall risk. Needs SNF but difficult placement due to need of airborne isolation room. Social worker has sent out referrals.  Possibly may discharge him home from the  hospital once he is more stronger and stable.  Active Problems: Dysphagia Moderate oropharyngeal dysphagia with sensorimotor deficit noted on MBS also noted hypomotility of the pharynx.  High aspiration risk.  Continue dysphagia level 1 diet.  ?  Mass in left atrium on 2D echo Unable to rule out vegetation.  Comments on severe thickening of the mitral valve leaflet with severe calcification with severe and bulky posterior annular calcification.  Subcostal images suggest mobile mass in the right atrium.  Cardiology consulted and was discussed with Dr. Meda Coffee who reviewed the echo and suggested the mass might be reflective of nonspecific congenital abnormality.  Recommends she will arrange outpatient follow-up once pulmonary issues resolve.  Nonsustained V. tach In the setting of persistent hypokalemia and hypomagnesemia.  Replenished.  Stable on telemetry. Continue daily kcl supplement   Iron deficiency anemia Low iron panel, normal B12 (483). Hemoccult negative. Add iron supplement if no interaction with ATT  Type 2 diabetes mellitus, controlled Secondary to severe weight loss and poor p.o. intake.  A1c of 6.3.  Monitor on sliding scale coverage only.  Adequate p.o. intake encouraged   Weight loss with protein calorie malnutrition, Secondary to underlying TB and failure to thrive.  Nutrition and PT consult appreciated.  Needs SNF.   Chronic kidney disease stage 3 Stable. Avoid nephrotoxins    Code Status : full  Family Communication  :  none  Disposition Plan  : SNF versus home with home health.  Barriers For Discharge : active symptoms  Consults  : ID, pulmonary  Procedures  : CT chest  DVT Prophylaxis  :  Lovenox -   Lab Results  Component Value Date   PLT 162 05/24/2019    Antibiotics  :    Anti-infectives (From admission, onward)   Start     Dose/Rate Route Frequency Ordered Stop   05/24/19 1000  pyrazinamide tablet 1,500 mg     1,500 mg Oral Daily 05/23/19 1626     05/24/19 1000  ethambutol (MYAMBUTOL) tablet 1,200 mg     1,200 mg Oral Daily 05/23/19 1626     05/19/19 1600  isoniazid (NYDRAZID) tablet 300 mg     300 mg Oral Daily 05/19/19 1428     05/19/19 1600  rifampin (RIFADIN) capsule 600 mg     600 mg Oral Daily 05/19/19 1428     05/19/19 1600  pyrazinamide tablet 1,000 mg  Status:  Discontinued     1,000 mg Oral Daily 05/19/19 1428 05/23/19 1626   05/19/19 1600  ethambutol (MYAMBUTOL) tablet 800 mg  Status:  Discontinued     800 mg Oral Daily 05/19/19 1428 05/23/19 1626   05/18/19 0000  vancomycin (VANCOCIN) IVPB 1000 mg/200 mL premix  Status:  Discontinued     1,000 mg 200 mL/hr over 60 Minutes Intravenous Every 36 hours 05/16/19 1219 05/17/19 1058   05/16/19 1130  vancomycin (VANCOCIN) 1,250 mg in sodium chloride 0.9 % 250 mL IVPB     1,250 mg 166.7 mL/hr over 90 Minutes Intravenous  Once 05/16/19 1100 05/16/19 1404   05/16/19 1000  ampicillin-sulbactam (UNASYN) 1.5 g in sodium chloride 0.9 % 100 mL IVPB     1.5 g 200 mL/hr over 30 Minutes Intravenous Every 6 hours 05/16/19 0952 05/23/19 2359        Objective:   Vitals:   05/26/19 1636 05/26/19 2047 05/27/19 0434 05/27/19 0500  BP:  (!) 158/84 (!) 141/87   Pulse: (!) 120 (!) 102 (!) 105   Resp:  20 20   Temp:  97.8 F (36.6 C) 97.6 F (36.4 C)   TempSrc:  Oral    SpO2: 96% 96% 99%   Weight:    63 kg  Height:        Wt Readings from Last 3 Encounters:  05/27/19 63 kg  03/23/19 73.9 kg  03/21/18 78.5 kg     Intake/Output  Summary (Last 24 hours) at 05/27/2019 1050 Last data filed at 05/27/2019 0600 Gross per 24 hour  Intake 420 ml  Output 1700 ml  Net -1280 ml   Physical exam Not in distress HEENT: Moist mucosa, supple neck Chest: Clear CVs: Normal S1-S2 GI: Soft, nondistended, nontender Musculoskeletal: Warm, no edema    Data Review:    CBC Recent Labs  Lab 05/24/19 0446  WBC 7.2  HGB 8.4*  HCT 29.9*  PLT 162  MCV 80.2  MCH 22.5*  MCHC 28.1*  RDW 17.1*    Chemistries  Recent Labs  Lab 05/22/19 0153 05/23/19 0209 05/23/19 1908 05/24/19 0446 05/25/19 0445 05/26/19 0420  NA 144  --  142 143 140 141  K 2.9*  --  3.1* 3.2* 3.4* 3.5  CL 103  --  103 104 102 101  CO2 29  --  30 30 32 29  GLUCOSE 86  --  132* 94 92 93  BUN 41*  --  48* 43* 40* 35*  CREATININE 1.62*  --  1.97* 1.70* 1.40* 1.35*  CALCIUM 8.5*  --  8.7* 8.7* 8.6* 8.8*  MG 1.9 2.2  --   --   --   --   AST  --   --   --  27  --   --   ALT  --   --   --  13  --   --   ALKPHOS  --   --   --  80  --   --   BILITOT  --   --   --  0.5  --   --    ------------------------------------------------------------------------------------------------------------------ No results for input(s): CHOL, HDL, LDLCALC, TRIG, CHOLHDL, LDLDIRECT in the last 72 hours.  Lab Results  Component Value Date   HGBA1C 6.3 (H) 05/16/2019   ------------------------------------------------------------------------------------------------------------------ No results for input(s): TSH, T4TOTAL, T3FREE, THYROIDAB in the last 72 hours.  Invalid input(s): FREET3 ------------------------------------------------------------------------------------------------------------------ No results for input(s): VITAMINB12, FOLATE, FERRITIN, TIBC, IRON, RETICCTPCT in the last 72 hours.  Coagulation profile No results for input(s): INR, PROTIME in the last 168 hours.  No results for input(s): DDIMER in the last 72 hours.  Cardiac Enzymes No results for  input(s): CKMB, TROPONINI, MYOGLOBIN in the last 168 hours.  Invalid input(s): CK ------------------------------------------------------------------------------------------------------------------    Component Value Date/Time   BNP 995.9 (H) 05/19/2019 1759    Inpatient Medications  Scheduled Meds:  buPROPion  150 mg Oral Daily   enoxaparin (LOVENOX) injection  40 mg Subcutaneous Q24H   ethambutol  1,200 mg Oral Daily   feeding supplement (ENSURE ENLIVE)  237 mL Oral Q24H   ferrous sulfate  325 mg Oral BID WC   ipratropium  2 puff Inhalation TID   isoniazid  300 mg Oral Daily   And   vitamin B-6  50 mg Oral Daily   levalbuterol  2 puff Inhalation TID   mouth rinse  15 mL Mouth Rinse BID   mometasone-formoterol  2 puff Inhalation BID   multivitamin with minerals  1 tablet Oral Daily   pantoprazole  40 mg Oral  Q0600   potassium chloride  40 mEq Oral Daily   pravastatin  20 mg Oral q1800   pyrazinamide  1,500 mg Oral Daily   rifampin  600 mg Oral Daily   thiamine  100 mg Oral Daily   Continuous Infusions:  sodium chloride Stopped (05/23/19 0250)   PRN Meds:.sodium chloride, acetaminophen **OR** acetaminophen, alum & mag hydroxide-simeth, dextrose, HYDROcodone-acetaminophen, loperamide, ondansetron **OR** ondansetron (ZOFRAN) IV, phenol, Resource ThickenUp Clear, zolpidem  Micro Results Recent Results (from the past 240 hour(s))  Acid Fast Smear (AFB)     Status: Abnormal   Collection Time: 05/17/19  8:08 PM   Specimen: Sputum  Result Value Ref Range Status   AFB Specimen Processing Concentration  Final   Acid Fast Smear Positive (A)  Final    Comment: (NOTE) 3+, 4-36 acid-fast bacilli per field at 400X magnification, fluorescent smear Positive smear reported to Aisha M. on 05/19/19 at 3:34 A.M by BMS. Performed At: [facility] [address] [place], [place] [id number] Jolene SchimkeNagendra Sanjai MD WU:[id number]:[phone]    Source (AFB) SPUTUM  Final     Comment: Performed at [facility], [address] [place], [place] [address]    Radiology Reports Dg Chest 2 View  Result Date: 05/15/2019 CLINICAL DATA:  EMS reports from home, lives alone, friend called for failure to thrive for three months. Weakness and recent fall this a.m. Smoker. Productive cough. Hx of Cardiomegaly and Type II Diabetes. EXAM: CHEST - 2 VIEW COMPARISON:  Chest CT, 09/07/2011 FINDINGS: There are numerous ill-defined bilateral lung nodules, more numerous on the left, with intervening hazy ground-glass type opacities. Lungs are hyperexpanded. There is an emphysematous bleb in the right upper lobe measuring approximately 6 cm. No pleural effusion.  No pneumothorax. Cardiac silhouette is normal in size.  No mediastinal masses. Skeletal structures are intact. IMPRESSION: 1. Multiple bilateral ill-defined nodular type opacities with intervening ground-glass opacities, greater throughout the left lung than on the right. Findings may be due to multifocal infection or inflammation. Metastatic disease is also in the differential diagnosis. Recommend follow-up assessment with chest CT with contrast. Electronically Signed   By: Amie Portlandavid  Ormond M.D.   On: 05/15/2019 19:24   Ct Head Wo Contrast  Result Date: 05/17/2019 CLINICAL DATA:  Head trauma with ataxia EXAM: CT HEAD WITHOUT CONTRAST TECHNIQUE: Contiguous axial images were obtained from the base of the skull through the vertex without intravenous contrast. COMPARISON:  08/27/2008 head CT FINDINGS: Brain: No evidence of acute infarction, hemorrhage, hydrocephalus, extra-axial collection or mass lesion/mass effect. Mild motion artifact of the ventral brain causing duplicated appearance of the falx. Generalized atrophy, central predominant, progressed from 2009. Chronic small vessel ischemia that is confluent in the deep white matter-progressive. Remote lacunar infarct in the left thalamus. Vascular: Atherosclerotic  calcification. The calcification has occurred at the right MCA and basilar since prior. Skull: Negative for fracture Sinuses/Orbits: No evidence of injury IMPRESSION: 1. No evidence of intracranial injury. 2. Atrophy and chronic small vessel ischemia with progression from 2009. 3. Mild motion artifact. Electronically Signed   By: Marnee SpringJonathon  Watts M.D.   On: 05/17/2019 04:55   Ct Chest W Contrast  Result Date: 05/15/2019 CLINICAL DATA:  Dyspnea, chronic, failure to thrive EXAM: CT CHEST WITH CONTRAST TECHNIQUE: Multidetector CT imaging of the chest was performed during intravenous contrast administration. CONTRAST:  60mL OMNIPAQUE IOHEXOL 300 MG/ML  SOLN COMPARISON:  Same day chest radiograph, CT chest 09/07/2011 FINDINGS: Cardiovascular: Normal heart size. No pericardial effusion. Atherosclerotic plaque  within the normal caliber aorta. Atherosclerotic calcification of the coronary arteries. Calcifications of the mitral annulus Mediastinum/Nodes: No enlarged mediastinal or axillary lymph nodes. Thyroid gland, trachea, and esophagus demonstrate no significant findings. Lungs/Pleura: Widespread randomly distributed, apically predominant consolidative opacities with central cavitation are present throughout both hemithoraces. A larger thick-walled cystic lesion is present in the right upper lobe measuring up to 6.3 cm in size. This larger focus corresponds well to an area ground-glass opacity on CT from 2012. No pneumothorax. No effusion. Upper Abdomen: Atrophic appearance of the left kidney. Few fluid attenuation cyst in the right kidney. Dependently layering gallstone within the otherwise normal gallbladder. Small hiatal hernia. Accessory splenule. Subcentimeter hypoattenuating foci in the spleen too small to fully characterize on CT imaging but statistically likely benign. Musculoskeletal: Multilevel degenerative changes are present in the imaged portions of the spine. IMPRESSION: Widespread nodular and cavitary  lesions throughout both lungs with a larger air-filled cystic lesion in the right upper lobe. Overall appearance is nonspecific and differential could include atypical infection such as tuberculosis which requires exclusion versus other considerations including cavitating malignancy such as diffuse squamous cell or septic pulmonary emboli. Sputum cultures and tissue sampling may provide a more definitive diagnosis. Aortic Atherosclerosis (ICD10-I70.0). Electronically Signed   By: Kreg Shropshire M.D.   On: 05/15/2019 20:39   Dg Chest Port 1 View  Result Date: 05/19/2019 CLINICAL DATA:  Hypoxia. EXAM: PORTABLE CHEST 1 VIEW COMPARISON:  Radiographs and CT scan of May 15, 2019. FINDINGS: Stable cardiomediastinal silhouette. No pneumothorax is noted. There are again noted multiple ill-defined nodular opacities throughout the left lung which may be slightly more prominent. Is uncertain if these represent multifocal pneumonia or possibly metastatic disease or other malignancy. The largest such abnormality is seen in the left lung apex. Minimal interstitial densities are noted in the right lung which may represent inflammation or scarring. Bony thorax is unremarkable. IMPRESSION: There are again noted multiple ill-defined nodular densities throughout the left lung which may be slightly more prominent compared to prior exam. Differential includes multifocal pneumonia or inflammation, or possibly metastatic disease or other malignancy. Electronically Signed   By: Lupita Raider M.D.   On: 05/19/2019 09:58   Dg Swallowing Func-speech Pathology  Result Date: 05/25/2019 CLINICAL DATA:  Dysphagia EXAM: ESOPHOGRAM/BARIUM SWALLOW.  Swallowing function TECHNIQUE: A swallowing function exam under fluoroscopy was performed under the direction of speech pathologist utilizing multiple consistencies of barium to assess the swallowing mechanism. Single contrast examination was performed using  thin barium. FLUOROSCOPY TIME:   Fluoroscopy Time:  4.8 minutes Radiation Exposure Index (if provided by the fluoroscopic device): 44.6 mGy Number of Acquired Spot Images: 0 full exposures COMPARISON:  None. FINDINGS: Swallow study: Penetration with trace aspiration with thin liquids. Please refer to speech language pathology note for a full detailed report. Esophagram: A single contrast esophagram was performed with patient seated in a chair following a swallow study. Intermittent tertiary contractions were observed under intermittent fluoroscopy. Evaluation of fine mucosal detail was limited secondary to difficulties with patient positioning and single-contrast technique. No persisting structures. No obvious mucosal ulcerations. Small hiatal hernia. A 13 mm barium tablet traversed the esophagus into the stomach without delay. IMPRESSION: 1. Trace aspiration with thin liquids on swallow study. Please refer to speech language pathology note for full detailed report. 2. Limited single-contrast esophagram without persisting stricture or obvious mucosal abnormality. 3. Small hiatal hernia. Electronically Signed   By: Duanne Guess M.D.   On: 05/25/2019 16:48  Vas [place] Lower Extremity Venous (dvt)  Result Date: 05/16/2019  Lower Venous Study Indications: Edema.  Limitations: Patient positioning. Comparison Study: no prior Performing Technologist: Blanch Media RVS  Examination Guidelines: A complete evaluation includes B-mode imaging, spectral Doppler, color Doppler, and power Doppler as needed of all accessible portions of each vessel. Bilateral testing is considered an integral part of a complete examination. Limited examinations for reoccurring indications may be performed as noted.  +---------+---------------+---------+-----------+----------+-------+  RIGHT     Compressibility Phasicity Spontaneity Properties Summary  +---------+---------------+---------+-----------+----------+-------+  CFV       Full            Yes       Yes                              +---------+---------------+---------+-----------+----------+-------+  SFJ       Full                                                      +---------+---------------+---------+-----------+----------+-------+  FV Prox   Full                                                      +---------+---------------+---------+-----------+----------+-------+  FV Mid    Full                                                      +---------+---------------+---------+-----------+----------+-------+  FV Distal Full                                                      +---------+---------------+---------+-----------+----------+-------+  PFV       Full                                                      +---------+---------------+---------+-----------+----------+-------+  POP       Full            Yes       Yes                             +---------+---------------+---------+-----------+----------+-------+  PTV       Full                                                      +---------+---------------+---------+-----------+----------+-------+  PERO      Full                                                      +---------+---------------+---------+-----------+----------+-------+  Summary: Right: There is no evidence of deep vein thrombosis in the lower extremity. No cystic structure found in the popliteal fossa.  *See table(s) above for measurements and observations. Electronically signed by Coral Else MD on 05/16/2019 at 8:43:11 PM.    Final    Dg Esophagus W Single Cm (sol Or Thin Ba)  Result Date: 05/25/2019 CLINICAL DATA:  Dysphagia EXAM: ESOPHOGRAM/BARIUM SWALLOW.  Swallowing function TECHNIQUE: A swallowing function exam under fluoroscopy was performed under the direction of speech pathologist utilizing multiple consistencies of barium to assess the swallowing mechanism. Single contrast examination was performed using  thin barium. FLUOROSCOPY TIME:  Fluoroscopy Time:  4.8 minutes Radiation Exposure Index (if  provided by the fluoroscopic device): 44.6 mGy Number of Acquired Spot Images: 0 full exposures COMPARISON:  None. FINDINGS: Swallow study: Penetration with trace aspiration with thin liquids. Please refer to speech language pathology note for a full detailed report. Esophagram: A single contrast esophagram was performed with patient seated in a chair following a swallow study. Intermittent tertiary contractions were observed under intermittent fluoroscopy. Evaluation of fine mucosal detail was limited secondary to difficulties with patient positioning and single-contrast technique. No persisting structures. No obvious mucosal ulcerations. Small hiatal hernia. A 13 mm barium tablet traversed the esophagus into the stomach without delay. IMPRESSION: 1. Trace aspiration with thin liquids on swallow study. Please refer to speech language pathology note for full detailed report. 2. Limited single-contrast esophagram without persisting stricture or obvious mucosal abnormality. 3. Small hiatal hernia. Electronically Signed   By: Duanne Guess M.D.   On: 05/25/2019 16:48    Time Spent in minutes 35   Dianelys Scinto M.D on 05/27/2019 at 10:50 AM  Between 7am to 7pm - Pager - [phone]  After 7pm go to [email or web address] - password [facility]  [facility] -  Office  [phone]

## 2019-05-28 LAB — BASIC METABOLIC PANEL
Anion gap: 8 (ref 5–15)
BUN: 44 mg/dL — ABNORMAL HIGH (ref 8–23)
CO2: 32 mmol/L (ref 22–32)
Calcium: 9.2 mg/dL (ref 8.9–10.3)
Chloride: 103 mmol/L (ref 98–111)
Creatinine, Ser: 1.45 mg/dL — ABNORMAL HIGH (ref 0.61–1.24)
GFR calc Af Amer: 55 mL/min — ABNORMAL LOW (ref >60)
GFR calc non Af Amer: 48 mL/min — ABNORMAL LOW (ref >60)
Glucose, Bld: 180 mg/dL — ABNORMAL HIGH (ref 70–99)
Potassium: 4.4 mmol/L (ref 3.5–5.1)
Sodium: 143 mmol/L (ref 135–145)

## 2019-05-28 LAB — GLUCOSE, CAPILLARY
Glucose-Capillary: 101 mg/dL — ABNORMAL HIGH (ref 70–99)
Glucose-Capillary: 93 mg/dL (ref 70–99)
Glucose-Capillary: 241 mg/dL — ABNORMAL HIGH (ref 70–99)

## 2019-05-28 MED ORDER — LEVALBUTEROL TARTRATE 45 MCG/ACT IN AERO
2.0000 | INHALATION_SPRAY | Freq: Three times a day (TID) | RESPIRATORY_TRACT | Status: DC | PRN
  Administered 2019-05-28 – 2019-06-27 (×4): 2 via RESPIRATORY_TRACT
  Filled 2019-05-28: qty 15

## 2019-05-28 NOTE — Progress Notes (Signed)
PROGRESS NOTE                                                                                                                                                                                                             Patient Demographics:    Clarence Maldonado, is a 73 y.o. male, DOB - [birth year], ZOX:[id number]  Admit date - 05/15/2019   Admitting Physician Therisa DoyneAnastassia Doutova, MD  Outpatient Primary MD for the patient is Kermit Baloeed, Tiffany L, DO  LOS - 13  Outpatient Specialists: None  Chief Complaint  Patient presents with   Failure To Thrive   Fatigue       Brief Narrative   73 year old male with history of stroke, COPD, CKD stage III, tobacco use, diabetes mellitus type 2, hypertension presented to the ED with progressive weakness for past 3 months with frequent falls.  He called EMS after a fall.  Reported poor p.o. intake and losing almost 43 pounds in the past 2 months.  No prior history of TB or exposure.  In the ED COVID-19 was tested negative.  CT of the chest showed multiple cavitary lesion suggestive of malignancy versus TB/atypical infection. Sputum for AFB and QuantiFERON gold positive.  MTB PCR also tested positive.  Hospital course complicated with patient developing acute hypoxic respiratory failure on 8/11 with ABG showing significant hypoxemia and hypercapnia (PO2 57 and PCO2 45).  Placed on empiric IV antibiotics.  Also found to have significant dysphagia.     Subjective:   Reports better appetite.  Complains of episodic numbness in bilateral lower leg.   Assessment  & Plan :    Principal Problem: Acute respiratory failure with hypoxia (HCC) Secondary to pulmonary TB (MTB PCR positive).  Also had aspiration pneumonia.  ID consult appreciated and started on 4 drug therapy (rifampin, isoniazid by acetamide and ethambutol for 2 months to cover both latent and presumed active TB. continue B6 and thiamine.   Health department notified.  Still requiring 2 L via nasal cannula.  Will attempt to wean further.  (Required stepdown monitoring with NRB transiently). Continue airborne isolation. Wean oxygen as tolerated.  Completed antibiotics for aspiration pneumonia.  Patient is extremely deconditioned and high fall risk. Needs SNF but difficult placement due to need of airborne isolation room. Social worker has sent out referrals.  Possibly may discharge him home  from the hospital once he is more stronger and stable.  Active Problems: Dysphagia Moderate oropharyngeal dysphagia with sensorimotor deficit noted on MBS also noted hypomotility of the pharynx.  High aspiration risk.  Continue dysphagia level 1 diet.  Peripheral neuropathy Possibly associated with isoniazid/ ?  Rifampin.  Will monitor on medication.  Continue B6.  PT follow-up.   ?  Mass in left atrium on 2D echo Unable to rule out vegetation.  Comments on severe thickening of the mitral valve leaflet with severe calcification with severe and bulky posterior annular calcification.  Subcostal images suggest mobile mass in the right atrium.  Cardiology consulted and was discussed with Dr. Meda Coffee who reviewed the echo and suggested the mass might be reflective of nonspecific congenital abnormality.  Recommends she will arrange outpatient follow-up once pulmonary issues resolve.  Nonsustained V. tach In the setting of persistent hypokalemia and hypomagnesemia.  Replenished.  Stable on telemetry and discontinued.  Hypokalemia Discontinue daily supplement as potassium improved to 4.4.   Iron deficiency anemia Low iron panel, normal B12 (483). Hemoccult negative.  Added iron supplement.   Type 2 diabetes mellitus, controlled Secondary to severe weight loss and poor p.o. intake.  A1c of 6.3.  Monitor on sliding scale coverage only.  Adequate p.o. intake encouraged   Weight loss with protein calorie malnutrition, Secondary to underlying  TB and failure to thrive.  Nutrition and PT consult appreciated.  Needs SNF.   Chronic kidney disease stage 3 Stable. Avoid nephrotoxins    Code Status : full  Family Communication  : none  Disposition Plan  : Possibly home with home health next week once physically more stronger and  safe to be discharged  Barriers For Discharge : active symptoms  Consults  : ID, pulmonary  Procedures  : CT chest  DVT Prophylaxis  :  Lovenox -   Lab Results  Component Value Date   PLT 162 05/24/2019    Antibiotics  :    Anti-infectives (From admission, onward)   Start     Dose/Rate Route Frequency Ordered Stop   05/24/19 1000  pyrazinamide tablet 1,500 mg     1,500 mg Oral Daily 05/23/19 1626     05/24/19 1000  ethambutol (MYAMBUTOL) tablet 1,200 mg     1,200 mg Oral Daily 05/23/19 1626     05/19/19 1600  isoniazid (NYDRAZID) tablet 300 mg     300 mg Oral Daily 05/19/19 1428     05/19/19 1600  rifampin (RIFADIN) capsule 600 mg     600 mg Oral Daily 05/19/19 1428     05/19/19 1600  pyrazinamide tablet 1,000 mg  Status:  Discontinued     1,000 mg Oral Daily 05/19/19 1428 05/23/19 1626   05/19/19 1600  ethambutol (MYAMBUTOL) tablet 800 mg  Status:  Discontinued     800 mg Oral Daily 05/19/19 1428 05/23/19 1626   05/18/19 0000  vancomycin (VANCOCIN) IVPB 1000 mg/200 mL premix  Status:  Discontinued     1,000 mg 200 mL/hr over 60 Minutes Intravenous Every 36 hours 05/16/19 1219 05/17/19 1058   05/16/19 1130  vancomycin (VANCOCIN) 1,250 mg in sodium chloride 0.9 % 250 mL IVPB     1,250 mg 166.7 mL/hr over 90 Minutes Intravenous  Once 05/16/19 1100 05/16/19 1404   05/16/19 1000  ampicillin-sulbactam (UNASYN) 1.5 g in sodium chloride 0.9 % 100 mL IVPB     1.5 g 200 mL/hr over 30 Minutes Intravenous Every 6 hours 05/16/19 0952 05/23/19 2359  Objective:   Vitals:   05/27/19 2028 05/28/19 0417 05/28/19 0837 05/28/19 1211  BP: (!) 157/89 (!) 149/81  135/86  Pulse: (!) 110 94   100  Resp: Temp: 98.6 F (37 C) 98.3 F (36.8 C)  97.9 F (36.6 C)  TempSrc: Oral Oral  Oral  SpO2: 97% 98% 98% 99%  Weight:  62.3 kg    Height:        Wt Readings from Last 3 Encounters:  05/28/19 62.3 kg  03/23/19 73.9 kg  03/21/18 78.5 kg     Intake/Output Summary (Last 24 hours) at 05/28/2019 1253 Last data filed at 05/28/2019 1100 Gross per 24 hour  Intake 480 ml  Output 2875 ml  Net -2395 ml   Physical exam Not in distress HEENT: Pallor present, moist mucosa, supple neck, Chest: Clear to auscultation bilaterally CVs: Normal S1-S2, GI: Soft, nondistended, nontender Musculoskeletal: Warm, no edema CNs: Normal strength and sensation in bilateral lower extremities.    Data Review:    CBC Recent Labs  Lab 05/24/19 0446  WBC 7.2  HGB 8.4*  HCT 29.9*  PLT 162  MCV 80.2  MCH 22.5*  MCHC 28.1*  RDW 17.1*    Chemistries  Recent Labs  Lab 05/22/19 0153 05/23/19 0209 05/23/19 1908 05/24/19 0446 05/25/19 0445 05/26/19 0420 05/28/19 0557  NA 144  --  142 143 140 141 143  K 2.9*  --  3.1* 3.2* 3.4* 3.5 4.4  CL 103  --  103 104 102 101 103  CO2 29  --  30 30 32 29 32  GLUCOSE 86  --  132* 94 92 93 180*  BUN 41*  --  48* 43* 40* 35* 44*  CREATININE 1.62*  --  1.97* 1.70* 1.40* 1.35* 1.45*  CALCIUM 8.5*  --  8.7* 8.7* 8.6* 8.8* 9.2  MG 1.9 2.2  --   --   --   --   --   AST  --   --   --  27  --   --   --   ALT  --   --   --  13  --   --   --   ALKPHOS  --   --   --  80  --   --   --   BILITOT  --   --   --  0.5  --   --   --    ------------------------------------------------------------------------------------------------------------------ No results for input(s): CHOL, HDL, LDLCALC, TRIG, CHOLHDL, LDLDIRECT in the last 72 hours.  Lab Results  Component Value Date   HGBA1C 6.3 (H) 05/16/2019   ------------------------------------------------------------------------------------------------------------------ No results for input(s):  TSH, T4TOTAL, T3FREE, THYROIDAB in the last 72 hours.  Invalid input(s): FREET3 ------------------------------------------------------------------------------------------------------------------ No results for input(s): VITAMINB12, FOLATE, FERRITIN, TIBC, IRON, RETICCTPCT in the last 72 hours.  Coagulation profile No results for input(s): INR, PROTIME in the last 168 hours.  No results for input(s): DDIMER in the last 72 hours.  Cardiac Enzymes No results for input(s): CKMB, TROPONINI, MYOGLOBIN in the last 168 hours.  Invalid input(s): CK ------------------------------------------------------------------------------------------------------------------    Component Value Date/Time   BNP 995.9 (H) 05/19/2019 1759    Inpatient Medications  Scheduled Meds:  buPROPion  150 mg Oral Daily   enoxaparin (LOVENOX) injection  40 mg Subcutaneous Q24H   ethambutol  1,200 mg Oral Daily   feeding supplement (ENSURE ENLIVE)  237 mL Oral Q24H   ferrous sulfate  325 mg  Oral BID WC   ipratropium  2 puff Inhalation TID   isoniazid  300 mg Oral Daily   And   vitamin B-6  50 mg Oral Daily   mouth rinse  15 mL Mouth Rinse BID   mometasone-formoterol  2 puff Inhalation BID   multivitamin with minerals  1 tablet Oral Daily   pantoprazole  40 mg Oral Q0600   pravastatin  20 mg Oral q1800   pyrazinamide  1,500 mg Oral Daily   rifampin  600 mg Oral Daily   thiamine  100 mg Oral Daily   Continuous Infusions:  sodium chloride Stopped (05/23/19 0250)   PRN Meds:.sodium chloride, acetaminophen **OR** acetaminophen, alum & mag hydroxide-simeth, dextrose, HYDROcodone-acetaminophen, levalbuterol, loperamide, ondansetron **OR** ondansetron (ZOFRAN) IV, phenol, Resource ThickenUp Clear, zolpidem  Micro Results No results found for this or any previous visit (from the past 240 hour(s)).  Radiology Reports Dg Chest 2 View  Result Date: 05/15/2019 CLINICAL DATA:  EMS reports from home,  lives alone, friend called for failure to thrive for three months. Weakness and recent fall this a.m. Smoker. Productive cough. Hx of Cardiomegaly and Type II Diabetes. EXAM: CHEST - 2 VIEW COMPARISON:  Chest CT, 09/07/2011 FINDINGS: There are numerous ill-defined bilateral lung nodules, more numerous on the left, with intervening hazy ground-glass type opacities. Lungs are hyperexpanded. There is an emphysematous bleb in the right upper lobe measuring approximately 6 cm. No pleural effusion.  No pneumothorax. Cardiac silhouette is normal in size.  No mediastinal masses. Skeletal structures are intact. IMPRESSION: 1. Multiple bilateral ill-defined nodular type opacities with intervening ground-glass opacities, greater throughout the left lung than on the right. Findings may be due to multifocal infection or inflammation. Metastatic disease is also in the differential diagnosis. Recommend follow-up assessment with chest CT with contrast. Electronically Signed   By: Amie Portlandavid  Ormond M.D.   On: 05/15/2019 19:24   Ct Head Wo Contrast  Result Date: 05/17/2019 CLINICAL DATA:  Head trauma with ataxia EXAM: CT HEAD WITHOUT CONTRAST TECHNIQUE: Contiguous axial images were obtained from the base of the skull through the vertex without intravenous contrast. COMPARISON:  08/27/2008 head CT FINDINGS: Brain: No evidence of acute infarction, hemorrhage, hydrocephalus, extra-axial collection or mass lesion/mass effect. Mild motion artifact of the ventral brain causing duplicated appearance of the falx. Generalized atrophy, central predominant, progressed from 2009. Chronic small vessel ischemia that is confluent in the deep white matter-progressive. Remote lacunar infarct in the left thalamus. Vascular: Atherosclerotic calcification. The calcification has occurred at the right MCA and basilar since prior. Skull: Negative for fracture Sinuses/Orbits: No evidence of injury IMPRESSION: 1. No evidence of intracranial injury. 2.  Atrophy and chronic small vessel ischemia with progression from 2009. 3. Mild motion artifact. Electronically Signed   By: Marnee SpringJonathon  Watts M.D.   On: 05/17/2019 04:55   Ct Chest W Contrast  Result Date: 05/15/2019 CLINICAL DATA:  Dyspnea, chronic, failure to thrive EXAM: CT CHEST WITH CONTRAST TECHNIQUE: Multidetector CT imaging of the chest was performed during intravenous contrast administration. CONTRAST:  60mL OMNIPAQUE IOHEXOL 300 MG/ML  SOLN COMPARISON:  Same day chest radiograph, CT chest 09/07/2011 FINDINGS: Cardiovascular: Normal heart size. No pericardial effusion. Atherosclerotic plaque within the normal caliber aorta. Atherosclerotic calcification of the coronary arteries. Calcifications of the mitral annulus Mediastinum/Nodes: No enlarged mediastinal or axillary lymph nodes. Thyroid gland, trachea, and esophagus demonstrate no significant findings. Lungs/Pleura: Widespread randomly distributed, apically predominant consolidative opacities with central cavitation are present throughout both hemithoraces. A larger thick-walled  cystic lesion is present in the right upper lobe measuring up to 6.3 cm in size. This larger focus corresponds well to an area ground-glass opacity on CT from 2012. No pneumothorax. No effusion. Upper Abdomen: Atrophic appearance of the left kidney. Few fluid attenuation cyst in the right kidney. Dependently layering gallstone within the otherwise normal gallbladder. Small hiatal hernia. Accessory splenule. Subcentimeter hypoattenuating foci in the spleen too small to fully characterize on CT imaging but statistically likely benign. Musculoskeletal: Multilevel degenerative changes are present in the imaged portions of the spine. IMPRESSION: Widespread nodular and cavitary lesions throughout both lungs with a larger air-filled cystic lesion in the right upper lobe. Overall appearance is nonspecific and differential could include atypical infection such as tuberculosis which  requires exclusion versus other considerations including cavitating malignancy such as diffuse squamous cell or septic pulmonary emboli. Sputum cultures and tissue sampling may provide a more definitive diagnosis. Aortic Atherosclerosis (ICD10-I70.0). Electronically Signed   By: Kreg Shropshire M.D.   On: 05/15/2019 20:39   Dg Chest Port 1 View  Result Date: 05/19/2019 CLINICAL DATA:  Hypoxia. EXAM: PORTABLE CHEST 1 VIEW COMPARISON:  Radiographs and CT scan of May 15, 2019. FINDINGS: Stable cardiomediastinal silhouette. No pneumothorax is noted. There are again noted multiple ill-defined nodular opacities throughout the left lung which may be slightly more prominent. Is uncertain if these represent multifocal pneumonia or possibly metastatic disease or other malignancy. The largest such abnormality is seen in the left lung apex. Minimal interstitial densities are noted in the right lung which may represent inflammation or scarring. Bony thorax is unremarkable. IMPRESSION: There are again noted multiple ill-defined nodular densities throughout the left lung which may be slightly more prominent compared to prior exam. Differential includes multifocal pneumonia or inflammation, or possibly metastatic disease or other malignancy. Electronically Signed   By: Lupita Raider M.D.   On: 05/19/2019 09:58   Dg Swallowing Func-speech Pathology  Result Date: 05/25/2019 CLINICAL DATA:  Dysphagia EXAM: ESOPHOGRAM/BARIUM SWALLOW.  Swallowing function TECHNIQUE: A swallowing function exam under fluoroscopy was performed under the direction of speech pathologist utilizing multiple consistencies of barium to assess the swallowing mechanism. Single contrast examination was performed using  thin barium. FLUOROSCOPY TIME:  Fluoroscopy Time:  4.8 minutes Radiation Exposure Index (if provided by the fluoroscopic device): 44.6 mGy Number of Acquired Spot Images: 0 full exposures COMPARISON:  None. FINDINGS: Swallow study:  Penetration with trace aspiration with thin liquids. Please refer to speech language pathology note for a full detailed report. Esophagram: A single contrast esophagram was performed with patient seated in a chair following a swallow study. Intermittent tertiary contractions were observed under intermittent fluoroscopy. Evaluation of fine mucosal detail was limited secondary to difficulties with patient positioning and single-contrast technique. No persisting structures. No obvious mucosal ulcerations. Small hiatal hernia. A 13 mm barium tablet traversed the esophagus into the stomach without delay. IMPRESSION: 1. Trace aspiration with thin liquids on swallow study. Please refer to speech language pathology note for full detailed report. 2. Limited single-contrast esophagram without persisting stricture or obvious mucosal abnormality. 3. Small hiatal hernia. Electronically Signed   By: Duanne Guess M.D.   On: 05/25/2019 16:48   Vas [place] Lower Extremity Venous (dvt)  Result Date: 05/16/2019  Lower Venous Study Indications: Edema.  Limitations: Patient positioning. Comparison Study: no prior Performing Technologist: Blanch Media RVS  Examination Guidelines: A complete evaluation includes B-mode imaging, spectral Doppler, color Doppler, and power Doppler as needed of all accessible portions of  each vessel. Bilateral testing is considered an integral part of a complete examination. Limited examinations for reoccurring indications may be performed as noted.  +---------+---------------+---------+-----------+----------+-------+  RIGHT     Compressibility Phasicity Spontaneity Properties Summary  +---------+---------------+---------+-----------+----------+-------+  CFV       Full            Yes       Yes                             +---------+---------------+---------+-----------+----------+-------+  SFJ       Full                                                       +---------+---------------+---------+-----------+----------+-------+  FV Prox   Full                                                      +---------+---------------+---------+-----------+----------+-------+  FV Mid    Full                                                      +---------+---------------+---------+-----------+----------+-------+  FV Distal Full                                                      +---------+---------------+---------+-----------+----------+-------+  PFV       Full                                                      +---------+---------------+---------+-----------+----------+-------+  POP       Full            Yes       Yes                             +---------+---------------+---------+-----------+----------+-------+  PTV       Full                                                      +---------+---------------+---------+-----------+----------+-------+  PERO      Full                                                      +---------+---------------+---------+-----------+----------+-------+     Summary: Right: There is no evidence of deep vein thrombosis in the lower  extremity. No cystic structure found in the popliteal fossa.  *See table(s) above for measurements and observations. Electronically signed by Coral ElseVance Brabham MD on 05/16/2019 at 8:43:11 PM.    Final    Dg Esophagus W Single Cm (sol Or Thin Ba)  Result Date: 05/25/2019 CLINICAL DATA:  Dysphagia EXAM: ESOPHOGRAM/BARIUM SWALLOW.  Swallowing function TECHNIQUE: A swallowing function exam under fluoroscopy was performed under the direction of speech pathologist utilizing multiple consistencies of barium to assess the swallowing mechanism. Single contrast examination was performed using  thin barium. FLUOROSCOPY TIME:  Fluoroscopy Time:  4.8 minutes Radiation Exposure Index (if provided by the fluoroscopic device): 44.6 mGy Number of Acquired Spot Images: 0 full exposures COMPARISON:  None. FINDINGS: Swallow study: Penetration  with trace aspiration with thin liquids. Please refer to speech language pathology note for a full detailed report. Esophagram: A single contrast esophagram was performed with patient seated in a chair following a swallow study. Intermittent tertiary contractions were observed under intermittent fluoroscopy. Evaluation of fine mucosal detail was limited secondary to difficulties with patient positioning and single-contrast technique. No persisting structures. No obvious mucosal ulcerations. Small hiatal hernia. A 13 mm barium tablet traversed the esophagus into the stomach without delay. IMPRESSION: 1. Trace aspiration with thin liquids on swallow study. Please refer to speech language pathology note for full detailed report. 2. Limited single-contrast esophagram without persisting stricture or obvious mucosal abnormality. 3. Small hiatal hernia. Electronically Signed   By: Duanne GuessNicholas  Plundo M.D.   On: 05/25/2019 16:48    Time Spent in minutes 35   Kynzlee Hucker M.D on 05/28/2019 at 12:53 PM  Between 7am to 7pm - Pager - [phone]  After 7pm go to [email or web address] - password [email or web address]  [facility] -  Office  [phone]

## 2019-05-29 LAB — GLUCOSE, CAPILLARY: Glucose-Capillary: 72 mg/dL (ref 70–99)

## 2019-05-29 MED ORDER — MAGIC MOUTHWASH W/LIDOCAINE
5.0000 mL | Freq: Three times a day (TID) | ORAL | Status: DC | PRN
  Administered 2019-05-29: 5 mL via ORAL
  Filled 2019-05-29 (×2): qty 5

## 2019-05-29 NOTE — Progress Notes (Signed)
PROGRESS NOTE                                                                                                                                                                                                             Patient Demographics:    Clarence Maldonado, is a 73 y.o. male, DOB - Apr 04, 1946, ELF:[id number]  Admit date - 05/15/2019   Admitting Physician Toy Baker, MD  Outpatient Primary MD for the patient is Gayland Curry, DO  LOS - 14  Outpatient Specialists: None  Chief Complaint  Patient presents with   Failure To Thrive   Fatigue       Brief Narrative   73 year old male with history of stroke, COPD, CKD stage III, tobacco use, diabetes mellitus type 2, hypertension presented to the ED with progressive weakness for past 3 months with frequent falls.  He called EMS after a fall.  Reported poor p.o. intake and losing almost 43 pounds in the past 2 months.  No prior history of TB or exposure.  In the ED COVID-19 was tested negative.  CT of the chest showed multiple cavitary lesion suggestive of malignancy versus TB/atypical infection. Sputum for AFB and QuantiFERON gold positive.  MTB PCR also tested positive.  Hospital course complicated with patient developing acute hypoxic respiratory failure on 8/11 with ABG showing significant hypoxemia and hypercapnia (PO2 57 and PCO2 45).  Placed on empiric IV antibiotics.  Also found to have significant dysphagia.     Subjective:   Complains of pain in the right side of the throat on swallowing.  No oral thrush noted on exam.  Reports numbness in his lower leg better.   Assessment  & Plan :    Principal Problem: Acute respiratory failure with hypoxia (HCC) Secondary to pulmonary TB (MTB PCR positive).  Also had aspiration pneumonia.  ID consult appreciated and started on 4 drug therapy (rifampin, isoniazid by acetamide and ethambutol for 2 months to cover  both latent and presumed active TB. continue B6 and thiamine.  Health department notified.  Still requiring 2 L via nasal cannula.  Continue to wean. Continue airborne isolation.   Completed antibiotics for aspiration pneumonia.  Patient is extremely deconditioned and high fall risk.  Cannot get SNF placement due to airborne isolation room.  Will need home with home health once he is much  stronger and safer to be discharged (may take another 3-4 days).   Active Problems: Dysphagia Moderate oropharyngeal dysphagia with sensorimotor deficit noted on MBS also noted hypomotility of the pharynx.  High aspiration risk.  Continue dysphagia level 1 diet.  Throat pain on swallowing Continue Chloraseptic spray.  Added Magic mouthwash.  No thrush on exam.  Peripheral neuropathy Possibly associated with isoniazid/ ?  Rifampin.  Monitor while on medication.  Continue B6.  PT follow-up.   ?  Mass in left atrium on 2D echo Unable to rule out vegetation.  Comments on severe thickening of the mitral valve leaflet with severe calcification with severe and bulky posterior annular calcification.  Subcostal images suggest mobile mass in the right atrium.  Cardiology consulted and was discussed with Dr. Delton SeeNelson who reviewed the echo and suggested the mass might be reflective of nonspecific congenital abnormality.  Recommends she will arrange outpatient follow-up once pulmonary issues resolve.  Nonsustained V. tach In the setting of persistent hypokalemia and hypomagnesemia.  Replenished.  Stable on telemetry and discontinued.  Hypokalemia Resolved with supplement.   Iron deficiency anemia Low iron panel, normal B12 (483). Hemoccult negative.  Added iron supplement.   Type 2 diabetes mellitus, controlled Secondary to severe weight loss and poor p.o. intake.  A1c of 6.3.  Monitor on sliding scale coverage only.  Adequate p.o. intake encouraged   Weight loss with protein calorie malnutrition, Secondary  to underlying TB and failure to thrive.  Nutrition and PT consult appreciated.  Needs SNF.   Chronic kidney disease stage 3 Stable. Avoid nephrotoxins    Code Status : full  Family Communication  : none  Disposition Plan  : home with HH possibly in 3-4 days once patient gets much stable and stronger to be home by himself.  (Will follow up with repeat PT eval today).  Barriers For Discharge : active symptoms  Consults  : ID, pulmonary  Procedures  : CT chest  DVT Prophylaxis  :  Lovenox -   Lab Results  Component Value Date   PLT 162 05/24/2019    Antibiotics  :    Anti-infectives (From admission, onward)   Start     Dose/Rate Route Frequency Ordered Stop   05/24/19 1000  pyrazinamide tablet 1,500 mg     1,500 mg Oral Daily 05/23/19 1626     05/24/19 1000  ethambutol (MYAMBUTOL) tablet 1,200 mg     1,200 mg Oral Daily 05/23/19 1626     05/19/19 1600  isoniazid (NYDRAZID) tablet 300 mg     300 mg Oral Daily 05/19/19 1428     05/19/19 1600  rifampin (RIFADIN) capsule 600 mg     600 mg Oral Daily 05/19/19 1428     05/19/19 1600  pyrazinamide tablet 1,000 mg  Status:  Discontinued     1,000 mg Oral Daily 05/19/19 1428 05/23/19 1626   05/19/19 1600  ethambutol (MYAMBUTOL) tablet 800 mg  Status:  Discontinued     800 mg Oral Daily 05/19/19 1428 05/23/19 1626   05/18/19 0000  vancomycin (VANCOCIN) IVPB 1000 mg/200 mL premix  Status:  Discontinued     1,000 mg 200 mL/hr over 60 Minutes Intravenous Every 36 hours 05/16/19 1219 05/17/19 1058   05/16/19 1130  vancomycin (VANCOCIN) 1,250 mg in sodium chloride 0.9 % 250 mL IVPB     1,250 mg 166.7 mL/hr over 90 Minutes Intravenous  Once 05/16/19 1100 05/16/19 1404   05/16/19 1000  ampicillin-sulbactam (UNASYN) 1.5 g in sodium  chloride 0.9 % 100 mL IVPB     1.5 g 200 mL/hr over 30 Minutes Intravenous Every 6 hours 05/16/19 0952 05/23/19 2359        Objective:   Vitals:   05/28/19 1211 05/28/19 2048 05/29/19 0414 05/29/19  0500  BP: 135/86 133/85 131/89   Pulse: 100 (!) 106 98   Resp: Temp: 97.9 F (36.6 C) 98.5 F (36.9 C) 97.7 F (36.5 C)   TempSrc: Oral Oral Oral   SpO2: 99% 94% 96%   Weight:    63.5 kg  Height:        Wt Readings from Last 3 Encounters:  05/29/19 63.5 kg  03/23/19 73.9 kg  03/21/18 78.5 kg     Intake/Output Summary (Last 24 hours) at 05/29/2019 1215 Last data filed at 05/29/2019 0732 Gross per 24 hour  Intake 1023 ml  Output 1700 ml  Net -677 ml   Physical exam Not in distress HEENT: Moist mucosa, supple neck, no thrush, no cervical lymphadenopathy or tenderness Chest: Clear bilaterally CVs: Normal S1-S2 GI: Soft, nontender, nondistended Musculoskeletal: Warm, no edema    Data Review:    CBC Recent Labs  Lab 05/24/19 0446  WBC 7.2  HGB 8.4*  HCT 29.9*  PLT 162  MCV 80.2  MCH 22.5*  MCHC 28.1*  RDW 17.1*    Chemistries  Recent Labs  Lab 05/23/19 0209 05/23/19 1908 05/24/19 0446 05/25/19 0445 05/26/19 0420 05/28/19 0557  NA  --  142 143 140 141 143  K  --  3.1* 3.2* 3.4* 3.5 4.4  CL  --  103 104 102 101 103  CO2  --  30 30 32 29 32  GLUCOSE  --  132* 94 92 93 180*  BUN  --  48* 43* 40* 35* 44*  CREATININE  --  1.97* 1.70* 1.40* 1.35* 1.45*  CALCIUM  --  8.7* 8.7* 8.6* 8.8* 9.2  MG 2.2  --   --   --   --   --   AST  --   --  27  --   --   --   ALT  --   --  13  --   --   --   ALKPHOS  --   --  80  --   --   --   BILITOT  --   --  0.5  --   --   --    ------------------------------------------------------------------------------------------------------------------ No results for input(s): CHOL, HDL, LDLCALC, TRIG, CHOLHDL, LDLDIRECT in the last 72 hours.  Lab Results  Component Value Date   HGBA1C 6.3 (H) 05/16/2019   ------------------------------------------------------------------------------------------------------------------ No results for input(s): TSH, T4TOTAL, T3FREE, THYROIDAB in the last 72 hours.  Invalid  input(s): FREET3 ------------------------------------------------------------------------------------------------------------------ No results for input(s): VITAMINB12, FOLATE, FERRITIN, TIBC, IRON, RETICCTPCT in the last 72 hours.  Coagulation profile No results for input(s): INR, PROTIME in the last 168 hours.  No results for input(s): DDIMER in the last 72 hours.  Cardiac Enzymes No results for input(s): CKMB, TROPONINI, MYOGLOBIN in the last 168 hours.  Invalid input(s): CK ------------------------------------------------------------------------------------------------------------------    Component Value Date/Time   BNP 995.9 (H) 05/19/2019 1759    Inpatient Medications  Scheduled Meds:  buPROPion  150 mg Oral Daily   enoxaparin (LOVENOX) injection  40 mg Subcutaneous Q24H   ethambutol  1,200 mg Oral Daily   feeding supplement (ENSURE ENLIVE)  237 mL Oral Q24H   ferrous sulfate  325  mg Oral BID WC   ipratropium  2 puff Inhalation TID   isoniazid  300 mg Oral Daily   And   vitamin B-6  50 mg Oral Daily   mouth rinse  15 mL Mouth Rinse BID   mometasone-formoterol  2 puff Inhalation BID   multivitamin with minerals  1 tablet Oral Daily   pantoprazole  40 mg Oral Q0600   pravastatin  20 mg Oral q1800   pyrazinamide  1,500 mg Oral Daily   rifampin  600 mg Oral Daily   thiamine  100 mg Oral Daily   Continuous Infusions:  sodium chloride Stopped (05/23/19 0250)   PRN Meds:.sodium chloride, acetaminophen **OR** acetaminophen, alum & mag hydroxide-simeth, dextrose, HYDROcodone-acetaminophen, levalbuterol, loperamide, magic mouthwash w/lidocaine, ondansetron **OR** ondansetron (ZOFRAN) IV, phenol, Resource ThickenUp Clear, zolpidem  Micro Results No results found for this or any previous visit (from the past 240 hour(s)).  Radiology Reports Dg Chest 2 View  Result Date: 05/15/2019 CLINICAL DATA:  EMS reports from home, lives alone, friend called for  failure to thrive for three months. Weakness and recent fall this a.m. Smoker. Productive cough. Hx of Cardiomegaly and Type II Diabetes. EXAM: CHEST - 2 VIEW COMPARISON:  Chest CT, 09/07/2011 FINDINGS: There are numerous ill-defined bilateral lung nodules, more numerous on the left, with intervening hazy ground-glass type opacities. Lungs are hyperexpanded. There is an emphysematous bleb in the right upper lobe measuring approximately 6 cm. No pleural effusion.  No pneumothorax. Cardiac silhouette is normal in size.  No mediastinal masses. Skeletal structures are intact. IMPRESSION: 1. Multiple bilateral ill-defined nodular type opacities with intervening ground-glass opacities, greater throughout the left lung than on the right. Findings may be due to multifocal infection or inflammation. Metastatic disease is also in the differential diagnosis. Recommend follow-up assessment with chest CT with contrast. Electronically Signed   By: Amie Portland M.D.   On: 05/15/2019 19:24   Ct Head Wo Contrast  Result Date: 05/17/2019 CLINICAL DATA:  Head trauma with ataxia EXAM: CT HEAD WITHOUT CONTRAST TECHNIQUE: Contiguous axial images were obtained from the base of the skull through the vertex without intravenous contrast. COMPARISON:  08/27/2008 head CT FINDINGS: Brain: No evidence of acute infarction, hemorrhage, hydrocephalus, extra-axial collection or mass lesion/mass effect. Mild motion artifact of the ventral brain causing duplicated appearance of the falx. Generalized atrophy, central predominant, progressed from 2009. Chronic small vessel ischemia that is confluent in the deep white matter-progressive. Remote lacunar infarct in the left thalamus. Vascular: Atherosclerotic calcification. The calcification has occurred at the right MCA and basilar since prior. Skull: Negative for fracture Sinuses/Orbits: No evidence of injury IMPRESSION: 1. No evidence of intracranial injury. 2. Atrophy and chronic small vessel  ischemia with progression from 2009. 3. Mild motion artifact. Electronically Signed   By: Marnee Spring M.D.   On: 05/17/2019 04:55   Ct Chest W Contrast  Result Date: 05/15/2019 CLINICAL DATA:  Dyspnea, chronic, failure to thrive EXAM: CT CHEST WITH CONTRAST TECHNIQUE: Multidetector CT imaging of the chest was performed during intravenous contrast administration. CONTRAST:  30mL OMNIPAQUE IOHEXOL 300 MG/ML  SOLN COMPARISON:  Same day chest radiograph, CT chest 09/07/2011 FINDINGS: Cardiovascular: Normal heart size. No pericardial effusion. Atherosclerotic plaque within the normal caliber aorta. Atherosclerotic calcification of the coronary arteries. Calcifications of the mitral annulus Mediastinum/Nodes: No enlarged mediastinal or axillary lymph nodes. Thyroid gland, trachea, and esophagus demonstrate no significant findings. Lungs/Pleura: Widespread randomly distributed, apically predominant consolidative opacities with central cavitation are present throughout both  hemithoraces. A larger thick-walled cystic lesion is present in the right upper lobe measuring up to 6.3 cm in size. This larger focus corresponds well to an area ground-glass opacity on CT from 2012. No pneumothorax. No effusion. Upper Abdomen: Atrophic appearance of the left kidney. Few fluid attenuation cyst in the right kidney. Dependently layering gallstone within the otherwise normal gallbladder. Small hiatal hernia. Accessory splenule. Subcentimeter hypoattenuating foci in the spleen too small to fully characterize on CT imaging but statistically likely benign. Musculoskeletal: Multilevel degenerative changes are present in the imaged portions of the spine. IMPRESSION: Widespread nodular and cavitary lesions throughout both lungs with a larger air-filled cystic lesion in the right upper lobe. Overall appearance is nonspecific and differential could include atypical infection such as tuberculosis which requires exclusion versus other  considerations including cavitating malignancy such as diffuse squamous cell or septic pulmonary emboli. Sputum cultures and tissue sampling may provide a more definitive diagnosis. Aortic Atherosclerosis (ICD10-I70.0). Electronically Signed   By: Kreg Shropshire M.D.   On: 05/15/2019 20:39   Dg Chest Port 1 View  Result Date: 05/19/2019 CLINICAL DATA:  Hypoxia. EXAM: PORTABLE CHEST 1 VIEW COMPARISON:  Radiographs and CT scan of May 15, 2019. FINDINGS: Stable cardiomediastinal silhouette. No pneumothorax is noted. There are again noted multiple ill-defined nodular opacities throughout the left lung which may be slightly more prominent. Is uncertain if these represent multifocal pneumonia or possibly metastatic disease or other malignancy. The largest such abnormality is seen in the left lung apex. Minimal interstitial densities are noted in the right lung which may represent inflammation or scarring. Bony thorax is unremarkable. IMPRESSION: There are again noted multiple ill-defined nodular densities throughout the left lung which may be slightly more prominent compared to prior exam. Differential includes multifocal pneumonia or inflammation, or possibly metastatic disease or other malignancy. Electronically Signed   By: Lupita Raider M.D.   On: 05/19/2019 09:58   Dg Swallowing Func-speech Pathology  Result Date: 05/25/2019 CLINICAL DATA:  Dysphagia EXAM: ESOPHOGRAM/BARIUM SWALLOW.  Swallowing function TECHNIQUE: A swallowing function exam under fluoroscopy was performed under the direction of speech pathologist utilizing multiple consistencies of barium to assess the swallowing mechanism. Single contrast examination was performed using  thin barium. FLUOROSCOPY TIME:  Fluoroscopy Time:  4.8 minutes Radiation Exposure Index (if provided by the fluoroscopic device): 44.6 mGy Number of Acquired Spot Images: 0 full exposures COMPARISON:  None. FINDINGS: Swallow study: Penetration with trace aspiration with  thin liquids. Please refer to speech language pathology note for a full detailed report. Esophagram: A single contrast esophagram was performed with patient seated in a chair following a swallow study. Intermittent tertiary contractions were observed under intermittent fluoroscopy. Evaluation of fine mucosal detail was limited secondary to difficulties with patient positioning and single-contrast technique. No persisting structures. No obvious mucosal ulcerations. Small hiatal hernia. A 13 mm barium tablet traversed the esophagus into the stomach without delay. IMPRESSION: 1. Trace aspiration with thin liquids on swallow study. Please refer to speech language pathology note for full detailed report. 2. Limited single-contrast esophagram without persisting stricture or obvious mucosal abnormality. 3. Small hiatal hernia. Electronically Signed   By: Duanne Guess M.D.   On: 05/25/2019 16:48   Vas [place] Lower Extremity Venous (dvt)  Result Date: 05/16/2019  Lower Venous Study Indications: Edema.  Limitations: Patient positioning. Comparison Study: no prior Performing Technologist: Blanch Media RVS  Examination Guidelines: A complete evaluation includes B-mode imaging, spectral Doppler, color Doppler, and power Doppler as needed of  all accessible portions of each vessel. Bilateral testing is considered an integral part of a complete examination. Limited examinations for reoccurring indications may be performed as noted.  +---------+---------------+---------+-----------+----------+-------+  RIGHT     Compressibility Phasicity Spontaneity Properties Summary  +---------+---------------+---------+-----------+----------+-------+  CFV       Full            Yes       Yes                             +---------+---------------+---------+-----------+----------+-------+  SFJ       Full                                                      +---------+---------------+---------+-----------+----------+-------+  FV Prox   Full                                                       +---------+---------------+---------+-----------+----------+-------+  FV Mid    Full                                                      +---------+---------------+---------+-----------+----------+-------+  FV Distal Full                                                      +---------+---------------+---------+-----------+----------+-------+  PFV       Full                                                      +---------+---------------+---------+-----------+----------+-------+  POP       Full            Yes       Yes                             +---------+---------------+---------+-----------+----------+-------+  PTV       Full                                                      +---------+---------------+---------+-----------+----------+-------+  PERO      Full                                                      +---------+---------------+---------+-----------+----------+-------+     Summary: Right: There is no evidence of deep vein  thrombosis in the lower extremity. No cystic structure found in the popliteal fossa.  *See table(s) above for measurements and observations. Electronically signed by Coral ElseVance Brabham MD on 05/16/2019 at 8:43:11 PM.    Final    Dg Esophagus W Single Cm (sol Or Thin Ba)  Result Date: 05/25/2019 CLINICAL DATA:  Dysphagia EXAM: ESOPHOGRAM/BARIUM SWALLOW.  Swallowing function TECHNIQUE: A swallowing function exam under fluoroscopy was performed under the direction of speech pathologist utilizing multiple consistencies of barium to assess the swallowing mechanism. Single contrast examination was performed using  thin barium. FLUOROSCOPY TIME:  Fluoroscopy Time:  4.8 minutes Radiation Exposure Index (if provided by the fluoroscopic device): 44.6 mGy Number of Acquired Spot Images: 0 full exposures COMPARISON:  None. FINDINGS: Swallow study: Penetration with trace aspiration with thin liquids. Please refer to speech language pathology note for a  full detailed report. Esophagram: A single contrast esophagram was performed with patient seated in a chair following a swallow study. Intermittent tertiary contractions were observed under intermittent fluoroscopy. Evaluation of fine mucosal detail was limited secondary to difficulties with patient positioning and single-contrast technique. No persisting structures. No obvious mucosal ulcerations. Small hiatal hernia. A 13 mm barium tablet traversed the esophagus into the stomach without delay. IMPRESSION: 1. Trace aspiration with thin liquids on swallow study. Please refer to speech language pathology note for full detailed report. 2. Limited single-contrast esophagram without persisting stricture or obvious mucosal abnormality. 3. Small hiatal hernia. Electronically Signed   By: Duanne GuessNicholas  Plundo M.D.   On: 05/25/2019 16:48    Time Spent in minutes 35   Domanique Luckett M.D on 05/29/2019 at 12:15 PM  Between 7am to 7pm - Pager - [phone]  After 7pm go to [email or web address] - password [email or web address]  [facility] -  Office  [phone]

## 2019-05-29 NOTE — TOC Progression Note (Signed)
Transition of Care [facility]) - Progression Note    Patient Details  Name: Clarence Maldonado MRN: [id number] Date of Birth: [birth year]  Transition of Care [facility]) CM/SW [place], LCSW Phone Number: 05/29/2019, 9:29 AM  Clinical Narrative:   CSW following patient for discharge needs. After further research patient will not be able to discharge to a SNF due to TB. SNF are not equip to handle TB patient. CSW will make patient aware     Expected Discharge Plan: [place] Barriers to Discharge: Continued Medical Work up  Expected Discharge Plan and Services Expected Discharge Plan: [place]   Discharge Planning Services: CM Consult   Living arrangements for the past 2 months: Mobile Home                                       Social Determinants of Health (SDOH) Interventions    Readmission Risk Interventions Readmission Risk Prevention Plan 05/23/2019  Transportation Screening Complete  PCP or Specialist Appt within 3-5 Days Not Complete  Not Complete comments Not ready for dc  HRI or [place] Complete  Social Work Consult for [place] Planning/Counseling Not Complete  SW consult not completed comments nA  Palliative Care Screening Not Applicable  Medication Review Press photographer) Complete  Some recent data might be hidden

## 2019-05-29 NOTE — Progress Notes (Signed)
SLP Cancellation Note  Patient Details Name: Clarence Maldonado MRN: [id number] DOB: Jul 11, 1946   Cancelled treatment:        pt on [facility], will continue efforts.  Luanna Salk, MS [facility] SLP Acute Rehab Services Pager [phone] Office [phone]    [name] 05/29/2019, 6:39 PM

## 2019-05-29 NOTE — Progress Notes (Signed)
Physical Therapy Treatment Patient Details Name: Clarence Maldonado MRN: [id number] DOB: January 14, 1946 Today's Date: 05/29/2019    History of Present Illness 73 y.o. male with medical history significant of cardiomegaly, stroke, COPD,   CKD stage III, tobacco abuse, DM 2, HTN who presented to the emergency room on 8/10 with progressive weakness over the last 3 months to the point where he was falling over. He has lost approximately 43 pounds in the past 2 months. CT scan of chest noted multiple cavitary lesions of unclear etiology with a differential diagnosis of malignancy versus TB versus atypical infection.    PT Comments    Pt very willing and excited to work today. Pt stated " my legs feel better after I have moved". Worked EOB exercises, and small bouts of walking in room with pt with great dyspnes 3/4 with each short distance in room with RW. Unclear of pt understands he has TB and his plan, is passionate about getting stronger but doesn't understand why he is having trouble breathing.   Perfusion and wave on finger pulse is very poor so unsure of the reading, suggest another method of O2 saturation reading if possible. Seemed he dipped down a bit with very little activity , but hard to really know , and HR was up and down a bit.      Follow Up Recommendations  SNF(due to TB pt likley will not be able to go to SNF, so will see pt as much as possible for a DC home plan.)     Equipment Recommendations  None recommended by PT    Recommendations for Other Services       Precautions / Restrictions Precautions Precautions: Fall Precaution Comments: multiple falls Restrictions Weight Bearing Restrictions: No    Mobility  Bed Mobility Overal bed mobility: Needs Assistance Bed Mobility: Supine to Sit   Sidelying to sit: Min guard          Transfers Overall transfer level: Needs assistance Equipment used: Rolling walker (2 wheeled) Transfers: Sit to/from Stand   Stand pivot  transfers: Min assist       General transfer comment: Verbal cues for safety and hand placment on RW. Pt with imporved strength with sit to stand for pre gait, however still weakness in LE noted with decreased control on descent each time ( 4xs today)  Ambulation/Gait Ambulation/Gait assistance: Min assist Gait Distance (Feet): 10 Feet(3x times in room) Assistive device: Rolling walker (2 wheeled) Gait Pattern/deviations: Step-through pattern     General Gait Details: small steps in room turning and walking. Pt with very limited activity tolerance, slow steady and needed great rest break sitting with breathing exercises ( with verbal cues) for at least 4 mintues before he could try again.   Stairs             Wheelchair Mobility    Modified Rankin (Stroke Patients Only)       Balance                                            Cognition Arousal/Alertness: Awake/alert Behavior During Therapy: WFL for tasks assessed/performed Overall Cognitive Status: Within Functional Limits for tasks assessed Area of Impairment: Orientation;Following commands;Problem solving                 Orientation Level: Disoriented to;Time     Following Commands: Follows one step  commands consistently Safety/Judgement: Decreased awareness of deficits(pt does not seem to understand that he has TB and his plan.)   Problem Solving: Requires verbal cues        Exercises Other Exercises Other Exercises: worked EOB with 10x 2 BLE knee extesions with rest between reps. Also educated about pursed lip breathing and slow breathing after each walk to help with perfusion.    General Comments        Pertinent Vitals/Pain Pain Assessment: No/denies pain    Home Living                      Prior Function            PT Goals (current goals can now be found in the care plan section) Acute Rehab PT Goals Patient Stated Goal: I want to get stronger so I can go  home. I don't want to be her for 6 months because of TB PT Goal Formulation: With patient Time For Goal Achievement: 06/19/19 Potential to Achieve Goals: Good    Frequency    Min 3X/week      PT Plan Current plan remains appropriate    Co-evaluation              AM-PAC PT "6 Clicks" Mobility   Outcome Measure  Help needed turning from your back to your side while in a flat bed without using bedrails?: A Little Help needed moving from lying on your back to sitting on the side of a flat bed without using bedrails?: A Little Help needed moving to and from a bed to a chair (including a wheelchair)?: A Lot Help needed standing up from a chair using your arms (e.g., wheelchair or bedside chair)?: A Little Help needed to walk in hospital room?: A Little Help needed climbing 3-5 steps with a railing? : Total 6 Click Score: 15    End of Session Equipment Utilized During Treatment: Gait belt Activity Tolerance: Patient limited by fatigue;Patient tolerated treatment well Patient left: with nursing/sitter in room;in chair;with chair alarm set(alerted nursing that pt does NOT have a chair alarm in his recliner (thi si how I found him) however I feel like he needs one. Shanda Bumps told me she would place one on him in next few minutes she had to go give meds.) Nurse Communication: Mobility status(Got him an extender on his O2 to help with mobility in room with nursing and PT /OT) PT Visit Diagnosis: Unsteadiness on feet (R26.81);Muscle weakness (generalized) (M62.81);Difficulty in walking, not elsewhere classified (R26.2);Adult, failure to thrive (R62.7);History of falling (Z91.81);Repeated falls (R29.6)     Time: 6440-3474 PT Time Calculation (min) (ACUTE ONLY): 37 min  Charges:  $Gait Training: 8-22 mins $Therapeutic Exercise: 8-22 mins                     Marella Bile, PT Acute Rehabilitation Services Pager: [phone] Office: [phone] 05/29/2019    Marella Bile 05/29/2019, 5:30 PM

## 2019-05-30 DIAGNOSIS — G6289 Other specified polyneuropathies: Secondary | ICD-10-CM

## 2019-05-30 LAB — GLUCOSE, CAPILLARY: Glucose-Capillary: 154 mg/dL — ABNORMAL HIGH (ref 70–99)

## 2019-05-30 LAB — TSH: TSH: 4.117 u[IU]/mL (ref 0.350–4.500)

## 2019-05-30 NOTE — Progress Notes (Signed)
CRITICAL VALUE ALERT  Critical Value:  8/11 & 8/12 both sputum cultures positive for isolated bacterium TB  Date & Time Notified:  05/30/2019 1556  Provider Notified: Dr. Clementeen Graham   Orders Received/Actions taken:  Dr. Clementeen Graham notified via text page.

## 2019-05-30 NOTE — Progress Notes (Signed)
PROGRESS NOTE                                                                                                                                                                                                             Patient Demographics:    Clarence Maldonado, is a 73 y.o. male, DOB - September 11, 1946, UEA:[id number]  Admit date - 05/15/2019   Admitting Physician Therisa Doyne, MD  Outpatient Primary MD for the patient is [name], DO  LOS - 15  Outpatient Specialists: None  Chief Complaint  Patient presents with   Failure To Thrive   Fatigue       Brief Narrative   73 year old male with history of stroke, COPD, CKD stage III, tobacco use, diabetes mellitus type 2, hypertension presented to the ED with progressive weakness for past 3 months with frequent falls.  He called EMS after a fall.  Reported poor p.o. intake and losing almost 43 pounds in the past 2 months.  No prior history of TB or exposure.  In the ED COVID-19 was tested negative.  CT of the chest showed multiple cavitary lesion suggestive of malignancy versus TB/atypical infection. Sputum for AFB and QuantiFERON gold positive.  MTB PCR also tested positive.  Hospital course complicated with patient developing acute hypoxic respiratory failure on 8/11 with ABG showing significant hypoxemia and hypercapnia (PO2 57 and PCO2 45).  Placed on empiric IV antibiotics.  Also found to have significant dysphagia.     Subjective:   Reports he is slowly gaining strength.  Leg numbness getting better.  Assessment  & Plan :    Principal Problem: Acute respiratory failure with hypoxia (HCC) Secondary to pulmonary TB (MTB PCR positive).  Also had aspiration pneumonia.  ID consult appreciated and started on 4 drug therapy (rifampin, isoniazid by acetamide and ethambutol for 2 months to cover both latent and presumed active TB. continue B6 and thiamine.  Health  department notified.  Still requiring 2 L via nasal cannula.  During PT evaluation yesterday could not get good pulse ox tracing.  Instructed his nurse to monitor pulse ox (on the forehead) to verify accurate reading.  Continue airborne isolation. Completed antibiotics for aspiration pneumonia.  Patient is extremely deconditioned and high fall risk.  Cannot get SNF placement due to airborne isolation room.  Will need home with home  health once he is much stronger and safer to be discharged (possibly by end of the week).   Active Problems: Dysphagia Moderate oropharyngeal dysphagia with sensorimotor deficit noted on MBS also noted hypomotility of the pharynx.  High aspiration risk.  Continue dysphagia level 1 diet.  Throat pain on swallowing Continue Chloraseptic spray.  Added Magic mouthwash.  Feels better today.  No thrush on exam.  Peripheral neuropathy Possibly associated with isoniazid/ ?  Rifampin.  Continue B6.  He does have iron deficiency anemia and started on supplement.  Reports some improvement.  TSH 2 months back was normal.  Will repeat.  B12 of 483.  Check HIV antibody and RPR.   ?  Mass in left atrium on 2D echo Unable to rule out vegetation.  Comments on severe thickening of the mitral valve leaflet with severe calcification with severe and bulky posterior annular calcification.  Subcostal images suggest mobile mass in the right atrium.  Cardiology consulted and was discussed with Dr. Delton See who reviewed the echo and suggested the mass might be reflective of nonspecific congenital abnormality.  Recommends she will arrange outpatient follow-up once pulmonary issues resolve.  Nonsustained V. tach In the setting of persistent hypokalemia and hypomagnesemia.  Replenished.  Stable on telemetry and discontinued.  Hypokalemia Resolved with supplement.   Iron deficiency anemia Low iron panel, normal B12 (483). Hemoccult negative.  Added iron supplement.   Type 2 diabetes  mellitus, controlled Secondary to severe weight loss and poor p.o. intake.  A1c of 6.3.  Monitor on sliding scale coverage only.  Adequate p.o. intake encouraged   Weight loss with protein calorie malnutrition, Secondary to underlying TB and failure to thrive.  Nutrition and PT consult appreciated.  Needs SNF.   Chronic kidney disease stage 3 Stable. Avoid nephrotoxins    Code Status : full  Family Communication  : none  Disposition Plan  : home with HH possibly in the next 3-4 days (by the end of the week once patient is much stronger to be at home and live by himself.  PT following  Barriers For Discharge : active symptoms  Consults  : ID, pulmonary  Procedures  : CT chest  DVT Prophylaxis  :  Lovenox -   Lab Results  Component Value Date   PLT 162 05/24/2019    Antibiotics  :    Anti-infectives (From admission, onward)   Start     Dose/Rate Route Frequency Ordered Stop   05/24/19 1000  pyrazinamide tablet 1,500 mg     1,500 mg Oral Daily 05/23/19 1626     05/24/19 1000  ethambutol (MYAMBUTOL) tablet 1,200 mg     1,200 mg Oral Daily 05/23/19 1626     05/19/19 1600  isoniazid (NYDRAZID) tablet 300 mg     300 mg Oral Daily 05/19/19 1428     05/19/19 1600  rifampin (RIFADIN) capsule 600 mg     600 mg Oral Daily 05/19/19 1428     05/19/19 1600  pyrazinamide tablet 1,000 mg  Status:  Discontinued     1,000 mg Oral Daily 05/19/19 1428 05/23/19 1626   05/19/19 1600  ethambutol (MYAMBUTOL) tablet 800 mg  Status:  Discontinued     800 mg Oral Daily 05/19/19 1428 05/23/19 1626   05/18/19 0000  vancomycin (VANCOCIN) IVPB 1000 mg/200 mL premix  Status:  Discontinued     1,000 mg 200 mL/hr over 60 Minutes Intravenous Every 36 hours 05/16/19 1219 05/17/19 1058   05/16/19 1130  vancomycin (  VANCOCIN) 1,250 mg in sodium chloride 0.9 % 250 mL IVPB     1,250 mg 166.7 mL/hr over 90 Minutes Intravenous  Once 05/16/19 1100 05/16/19 1404   05/16/19 1000  ampicillin-sulbactam (UNASYN)  1.5 g in sodium chloride 0.9 % 100 mL IVPB     1.5 g 200 mL/hr over 30 Minutes Intravenous Every 6 hours 05/16/19 0952 05/23/19 2359        Objective:   Vitals:   05/29/19 1345 05/29/19 2032 05/30/19 0455 05/30/19 0459  BP:  139/88 (!) 143/89   Pulse: (!) 103 (!) 107 95   Resp:  (!) 21 20   Temp:  98 F (36.7 C) 98.1 F (36.7 C)   TempSrc:  Oral Oral   SpO2: 91% 96% 98%   Weight:    65.6 kg  Height:        Wt Readings from Last 3 Encounters:  05/30/19 65.6 kg  03/23/19 73.9 kg  03/21/18 78.5 kg     Intake/Output Summary (Last 24 hours) at 05/30/2019 1404 Last data filed at 05/30/2019 1143 Gross per 24 hour  Intake 480 ml  Output 1200 ml  Net -720 ml    Physical exam Elderly male fatigue, not in distress HEENT: Moist mucosa, supple neck Chest: Clear CVS: Normal S1-S2 GI: Soft, nontender, nondistended Musculoskeletal: Warm, no edema     Data Review:    CBC Recent Labs  Lab 05/24/19 0446  WBC 7.2  HGB 8.4*  HCT 29.9*  PLT 162  MCV 80.2  MCH 22.5*  MCHC 28.1*  RDW 17.1*    Chemistries  Recent Labs  Lab 05/23/19 1908 05/24/19 0446 05/25/19 0445 05/26/19 0420 05/28/19 0557  NA 142 143 140 141 143  K 3.1* 3.2* 3.4* 3.5 4.4  CL 103 104 102 101 103  CO2 30 30 32 29 32  GLUCOSE 132* 94 92 93 180*  BUN 48* 43* 40* 35* 44*  CREATININE 1.97* 1.70* 1.40* 1.35* 1.45*  CALCIUM 8.7* 8.7* 8.6* 8.8* 9.2  AST  --  27  --   --   --   ALT  --  13  --   --   --   ALKPHOS  --  80  --   --   --   BILITOT  --  0.5  --   --   --    ------------------------------------------------------------------------------------------------------------------ No results for input(s): CHOL, HDL, LDLCALC, TRIG, CHOLHDL, LDLDIRECT in the last 72 hours.  Lab Results  Component Value Date   HGBA1C 6.3 (H) 05/16/2019   ------------------------------------------------------------------------------------------------------------------ No results for input(s): TSH, T4TOTAL,  T3FREE, THYROIDAB in the last 72 hours.  Invalid input(s): FREET3 ------------------------------------------------------------------------------------------------------------------ No results for input(s): VITAMINB12, FOLATE, FERRITIN, TIBC, IRON, RETICCTPCT in the last 72 hours.  Coagulation profile No results for input(s): INR, PROTIME in the last 168 hours.  No results for input(s): DDIMER in the last 72 hours.  Cardiac Enzymes No results for input(s): CKMB, TROPONINI, MYOGLOBIN in the last 168 hours.  Invalid input(s): CK ------------------------------------------------------------------------------------------------------------------    Component Value Date/Time   BNP 995.9 (H) 05/19/2019 1759    Inpatient Medications  Scheduled Meds:  buPROPion  150 mg Oral Daily   enoxaparin (LOVENOX) injection  40 mg Subcutaneous Q24H   ethambutol  1,200 mg Oral Daily   feeding supplement (ENSURE ENLIVE)  237 mL Oral Q24H   ferrous sulfate  325 mg Oral BID WC   ipratropium  2 puff Inhalation TID   isoniazid  300 mg Oral  Daily   And   vitamin B-6  50 mg Oral Daily   mouth rinse  15 mL Mouth Rinse BID   mometasone-formoterol  2 puff Inhalation BID   multivitamin with minerals  1 tablet Oral Daily   pantoprazole  40 mg Oral Q0600   pravastatin  20 mg Oral q1800   pyrazinamide  1,500 mg Oral Daily   rifampin  600 mg Oral Daily   thiamine  100 mg Oral Daily   Continuous Infusions:  sodium chloride Stopped (05/23/19 0250)   PRN Meds:.sodium chloride, acetaminophen **OR** acetaminophen, alum & mag hydroxide-simeth, dextrose, HYDROcodone-acetaminophen, levalbuterol, loperamide, magic mouthwash w/lidocaine, ondansetron **OR** ondansetron (ZOFRAN) IV, phenol, Resource ThickenUp Clear, zolpidem  Micro Results No results found for this or any previous visit (from the past 240 hour(s)).  Radiology Reports Dg Chest 2 View  Result Date: 05/15/2019 CLINICAL DATA:  EMS  reports from home, lives alone, friend called for failure to thrive for three months. Weakness and recent fall this a.m. Smoker. Productive cough. Hx of Cardiomegaly and Type II Diabetes. EXAM: CHEST - 2 VIEW COMPARISON:  Chest CT, 09/07/2011 FINDINGS: There are numerous ill-defined bilateral lung nodules, more numerous on the left, with intervening hazy ground-glass type opacities. Lungs are hyperexpanded. There is an emphysematous bleb in the right upper lobe measuring approximately 6 cm. No pleural effusion.  No pneumothorax. Cardiac silhouette is normal in size.  No mediastinal masses. Skeletal structures are intact. IMPRESSION: 1. Multiple bilateral ill-defined nodular type opacities with intervening ground-glass opacities, greater throughout the left lung than on the right. Findings may be due to multifocal infection or inflammation. Metastatic disease is also in the differential diagnosis. Recommend follow-up assessment with chest CT with contrast. Electronically Signed   By: Amie Portland M.D.   On: 05/15/2019 19:24   Ct Head Wo Contrast  Result Date: 05/17/2019 CLINICAL DATA:  Head trauma with ataxia EXAM: CT HEAD WITHOUT CONTRAST TECHNIQUE: Contiguous axial images were obtained from the base of the skull through the vertex without intravenous contrast. COMPARISON:  08/27/2008 head CT FINDINGS: Brain: No evidence of acute infarction, hemorrhage, hydrocephalus, extra-axial collection or mass lesion/mass effect. Mild motion artifact of the ventral brain causing duplicated appearance of the falx. Generalized atrophy, central predominant, progressed from 2009. Chronic small vessel ischemia that is confluent in the deep white matter-progressive. Remote lacunar infarct in the left thalamus. Vascular: Atherosclerotic calcification. The calcification has occurred at the right MCA and basilar since prior. Skull: Negative for fracture Sinuses/Orbits: No evidence of injury IMPRESSION: 1. No evidence of  intracranial injury. 2. Atrophy and chronic small vessel ischemia with progression from 2009. 3. Mild motion artifact. Electronically Signed   By: Marnee Spring M.D.   On: 05/17/2019 04:55   Ct Chest W Contrast  Result Date: 05/15/2019 CLINICAL DATA:  Dyspnea, chronic, failure to thrive EXAM: CT CHEST WITH CONTRAST TECHNIQUE: Multidetector CT imaging of the chest was performed during intravenous contrast administration. CONTRAST:  60mL OMNIPAQUE IOHEXOL 300 MG/ML  SOLN COMPARISON:  Same day chest radiograph, CT chest 09/07/2011 FINDINGS: Cardiovascular: Normal heart size. No pericardial effusion. Atherosclerotic plaque within the normal caliber aorta. Atherosclerotic calcification of the coronary arteries. Calcifications of the mitral annulus Mediastinum/Nodes: No enlarged mediastinal or axillary lymph nodes. Thyroid gland, trachea, and esophagus demonstrate no significant findings. Lungs/Pleura: Widespread randomly distributed, apically predominant consolidative opacities with central cavitation are present throughout both hemithoraces. A larger thick-walled cystic lesion is present in the right upper lobe measuring up to 6.3 cm in  size. This larger focus corresponds well to an area ground-glass opacity on CT from 2012. No pneumothorax. No effusion. Upper Abdomen: Atrophic appearance of the left kidney. Few fluid attenuation cyst in the right kidney. Dependently layering gallstone within the otherwise normal gallbladder. Small hiatal hernia. Accessory splenule. Subcentimeter hypoattenuating foci in the spleen too small to fully characterize on CT imaging but statistically likely benign. Musculoskeletal: Multilevel degenerative changes are present in the imaged portions of the spine. IMPRESSION: Widespread nodular and cavitary lesions throughout both lungs with a larger air-filled cystic lesion in the right upper lobe. Overall appearance is nonspecific and differential could include atypical infection such as  tuberculosis which requires exclusion versus other considerations including cavitating malignancy such as diffuse squamous cell or septic pulmonary emboli. Sputum cultures and tissue sampling may provide a more definitive diagnosis. Aortic Atherosclerosis (ICD10-I70.0). Electronically Signed   By: Kreg ShropshirePrice  DeHay M.D.   On: 05/15/2019 20:39   Dg Chest Port 1 View  Result Date: 05/19/2019 CLINICAL DATA:  Hypoxia. EXAM: PORTABLE CHEST 1 VIEW COMPARISON:  Radiographs and CT scan of May 15, 2019. FINDINGS: Stable cardiomediastinal silhouette. No pneumothorax is noted. There are again noted multiple ill-defined nodular opacities throughout the left lung which may be slightly more prominent. Is uncertain if these represent multifocal pneumonia or possibly metastatic disease or other malignancy. The largest such abnormality is seen in the left lung apex. Minimal interstitial densities are noted in the right lung which may represent inflammation or scarring. Bony thorax is unremarkable. IMPRESSION: There are again noted multiple ill-defined nodular densities throughout the left lung which may be slightly more prominent compared to prior exam. Differential includes multifocal pneumonia or inflammation, or possibly metastatic disease or other malignancy. Electronically Signed   By: Lupita RaiderJames  Green Jr M.D.   On: 05/19/2019 09:58   Dg Swallowing Func-speech Pathology  Result Date: 05/25/2019 CLINICAL DATA:  Dysphagia EXAM: ESOPHOGRAM/BARIUM SWALLOW.  Swallowing function TECHNIQUE: A swallowing function exam under fluoroscopy was performed under the direction of speech pathologist utilizing multiple consistencies of barium to assess the swallowing mechanism. Single contrast examination was performed using  thin barium. FLUOROSCOPY TIME:  Fluoroscopy Time:  4.8 minutes Radiation Exposure Index (if provided by the fluoroscopic device): 44.6 mGy Number of Acquired Spot Images: 0 full exposures COMPARISON:  None. FINDINGS:  Swallow study: Penetration with trace aspiration with thin liquids. Please refer to speech language pathology note for a full detailed report. Esophagram: A single contrast esophagram was performed with patient seated in a chair following a swallow study. Intermittent tertiary contractions were observed under intermittent fluoroscopy. Evaluation of fine mucosal detail was limited secondary to difficulties with patient positioning and single-contrast technique. No persisting structures. No obvious mucosal ulcerations. Small hiatal hernia. A 13 mm barium tablet traversed the esophagus into the stomach without delay. IMPRESSION: 1. Trace aspiration with thin liquids on swallow study. Please refer to speech language pathology note for full detailed report. 2. Limited single-contrast esophagram without persisting stricture or obvious mucosal abnormality. 3. Small hiatal hernia. Electronically Signed   By: Duanne GuessNicholas  Plundo M.D.   On: 05/25/2019 16:48   Vas [place] Lower Extremity Venous (dvt)  Result Date: 05/16/2019  Lower Venous Study Indications: Edema.  Limitations: Patient positioning. Comparison Study: no prior Performing Technologist: Blanch MediaMegan Riddle RVS  Examination Guidelines: A complete evaluation includes B-mode imaging, spectral Doppler, color Doppler, and power Doppler as needed of all accessible portions of each vessel. Bilateral testing is considered an integral part of a complete examination. Limited examinations  for reoccurring indications may be performed as noted.  +---------+---------------+---------+-----------+----------+-------+  RIGHT     Compressibility Phasicity Spontaneity Properties Summary  +---------+---------------+---------+-----------+----------+-------+  CFV       Full            Yes       Yes                             +---------+---------------+---------+-----------+----------+-------+  SFJ       Full                                                       +---------+---------------+---------+-----------+----------+-------+  FV Prox   Full                                                      +---------+---------------+---------+-----------+----------+-------+  FV Mid    Full                                                      +---------+---------------+---------+-----------+----------+-------+  FV Distal Full                                                      +---------+---------------+---------+-----------+----------+-------+  PFV       Full                                                      +---------+---------------+---------+-----------+----------+-------+  POP       Full            Yes       Yes                             +---------+---------------+---------+-----------+----------+-------+  PTV       Full                                                      +---------+---------------+---------+-----------+----------+-------+  PERO      Full                                                      +---------+---------------+---------+-----------+----------+-------+     Summary: Right: There is no evidence of deep vein thrombosis in the lower extremity. No cystic structure found in the popliteal fossa.  *See table(s) above for measurements  and observations. Electronically signed by Coral ElseVance Brabham MD on 05/16/2019 at 8:43:11 PM.    Final    Dg Esophagus W Single Cm (sol Or Thin Ba)  Result Date: 05/25/2019 CLINICAL DATA:  Dysphagia EXAM: ESOPHOGRAM/BARIUM SWALLOW.  Swallowing function TECHNIQUE: A swallowing function exam under fluoroscopy was performed under the direction of speech pathologist utilizing multiple consistencies of barium to assess the swallowing mechanism. Single contrast examination was performed using  thin barium. FLUOROSCOPY TIME:  Fluoroscopy Time:  4.8 minutes Radiation Exposure Index (if provided by the fluoroscopic device): 44.6 mGy Number of Acquired Spot Images: 0 full exposures COMPARISON:  None. FINDINGS: Swallow study: Penetration  with trace aspiration with thin liquids. Please refer to speech language pathology note for a full detailed report. Esophagram: A single contrast esophagram was performed with patient seated in a chair following a swallow study. Intermittent tertiary contractions were observed under intermittent fluoroscopy. Evaluation of fine mucosal detail was limited secondary to difficulties with patient positioning and single-contrast technique. No persisting structures. No obvious mucosal ulcerations. Small hiatal hernia. A 13 mm barium tablet traversed the esophagus into the stomach without delay. IMPRESSION: 1. Trace aspiration with thin liquids on swallow study. Please refer to speech language pathology note for full detailed report. 2. Limited single-contrast esophagram without persisting stricture or obvious mucosal abnormality. 3. Small hiatal hernia. Electronically Signed   By: Duanne GuessNicholas  Plundo M.D.   On: 05/25/2019 16:48    Time Spent in minutes 25   Jerianne Anselmo M.D on 05/30/2019 at 2:04 PM  Between 7am to 7pm - Pager - [phone]  After 7pm go to [email or web address] - password [email or web address]  [facility] -  Office  [phone]

## 2019-05-30 NOTE — Progress Notes (Signed)
[facility] for Infectious Disease    Date of Admission:  05/15/2019   Total days of antibiotics 16        Day 11 RIPE           ID: Clarence Maldonado is a 73 y.o. male with wasting, productive cough found to have pulm mTB and deconditioning Principal Problem:   Aspiration pneumonia ([place]) Active Problems:   Essential hypertension   CKD (chronic kidney disease) stage 3, GFR 30-59 ml/min (HCC)   Hyperlipidemia   Tobacco use disorder   Controlled type 2 diabetes mellitus with stage 3 chronic kidney disease, without long-term current use of insulin (HCC)   Weight loss   Hyperlipidemia associated with type 2 diabetes mellitus ([place])   Cavitary lesion of lung   Hypokalemia   Cavitary pneumonia   Anemia    Subjective: Having mild cough. Good appetite. Still working with PT/OT  Medications:  . buPROPion  150 mg Oral Daily  . enoxaparin (LOVENOX) injection  40 mg Subcutaneous Q24H  . ethambutol  1,200 mg Oral Daily  . feeding supplement (ENSURE ENLIVE)  237 mL Oral Q24H  . ferrous sulfate  325 mg Oral BID WC  . ipratropium  2 puff Inhalation TID  . isoniazid  300 mg Oral Daily   And  . vitamin B-6  50 mg Oral Daily  . mouth rinse  15 mL Mouth Rinse BID  . mometasone-formoterol  2 puff Inhalation BID  . multivitamin with minerals  1 tablet Oral Daily  . pantoprazole  40 mg Oral Q0600  . pravastatin  20 mg Oral q1800  . pyrazinamide  1,500 mg Oral Daily  . rifampin  600 mg Oral Daily  . thiamine  100 mg Oral Daily    Objective: Vital signs in last 24 hours: Temp:  [98 F (36.7 C)-98.1 F (36.7 C)] 98.1 F (36.7 C) (08/25 0455) Pulse Rate:  [95-107] 95 (08/25 0455) Resp:  [20-21] 20 (08/25 0455) BP: (139-143)/(88-89) 143/89 (08/25 0455) SpO2:  [96 %-98 %] 98 % (08/25 0455) Weight:  [65.6 kg] 65.6 kg (08/25 0459) Physical Exam  Constitutional: He is oriented to person, place, and time. He appears well-developed and well-nourished. No distress.  HENT:   Mouth/Throat: Oropharynx is clear and moist. No oropharyngeal exudate.  Cardiovascular: Normal rate, regular rhythm and normal heart sounds. Exam reveals no gallop and no friction rub.  No murmur heard.  Pulmonary/Chest: Effort normal and breath sounds -bilateral rhonchi Abdominal: Soft. Bowel sounds are normal. He exhibits no distension. There is no tenderness.  Lymphadenopathy:  He has no cervical adenopathy.  Neurological: He is alert and oriented to person, place, and time.  Skin: Skin is warm and dry. No rash noted. No erythema.  Psychiatric: He has a normal mood and affect. His behavior is normal.     Lab Results Recent Labs    05/28/19 0557  NA 143  K 4.4  CL 103  CO2 32  BUN 44*  CREATININE 1.45*    Microbiology: +MTB Studies/Results: No results found.   Assessment/Plan: Pulmonary mTB = continue with RIPE with vit B6. Recommend to get weekly CMP, and cbc with diff. Currently on day 11 of tx. Consider getting repeat AFB sputum on day 14 to see if burden is decreasing  Decondition/weight loss = continue with supplementation of daily diet.   Mouth pain = physical exam did not suggest any ulcers or thrush. Continue to monitor  ? Leg numbness = discussed with  dr Gonzella Lex to check vitamin deficiency, and will need RPR (last checked in 2009)  [facility] for Infectious Diseases Cell: [phone] Pager: [phone]  05/30/2019, 2:58 PM

## 2019-05-30 NOTE — Progress Notes (Addendum)
Occupational Therapy Treatment Patient Details Name: Clarence Maldonado MRN: [id number] DOB: [birth year] Today's Date: 05/30/2019    History of present illness 73 y.o. male with medical history significant of cardiomegaly, stroke, COPD,   CKD stage III, tobacco abuse, DM 2, HTN who presented to the emergency room on 8/10 with progressive weakness over the last 3 months to the point where he was falling over. He has lost approximately 43 pounds in the past 2 months. CT scan of chest noted multiple cavitary lesions of unclear etiology with a differential diagnosis of malignancy versus TB versus atypical infection.   OT comments  Pt progressing towards OT goals, completing room level functional mobility using RW with minA. Pt engaging in meaningful seated grooming ADL (shaving) while seated in bathroom. Pt requiring significant time to perform all mobility and ADL tasks due to continued fatigue/weakness, often requesting seated rest breaks. Pt on 3-4L O2 during session, difficult to obtain accurate SpO2 reading but when able to O2 sats >90%. Will continue to follow acutely to progress pt towards established OT goals.    Follow Up Recommendations  SNF(vs HH pending progress)    Equipment Recommendations  3 in 1 bedside commode          Precautions / Restrictions Precautions Precautions: Fall Precaution Comments: multiple falls Restrictions Weight Bearing Restrictions: No       Mobility Bed Mobility Overal bed mobility: Needs Assistance Bed Mobility: Supine to Sit;Sit to Supine     Supine to sit: Min guard Sit to supine: Min guard   General bed mobility comments: for safety  Transfers Overall transfer level: Needs assistance Equipment used: Rolling walker (2 wheeled) Transfers: Sit to/from Stand Sit to Stand: Min assist Stand pivot transfers: Min assist       General transfer comment: boosting assist to rise and steady at RW, performed multiple sit<>stands during session,  requires VCs for safe hand placement with poor recall of hand placement during following sit<>stands; pt transfer stand pivot BSC in bathroom > recliner > EOB as pt reports feeling too weak to attempt ambulating back to bed (ambulated from EOB to BSC in bathroom)    Balance Overall balance assessment: Needs assistance         Standing balance support: Bilateral upper extremity supported;During functional activity Standing balance-Leahy Scale: Poor                             ADL either performed or assessed with clinical judgement   ADL Overall ADL's : Needs assistance/impaired     Grooming: Set up;Min guard;Sitting Grooming Details (indicate cue type and reason): pt shaving this session, seated at sink in bathroom on BSC                             Functional mobility during ADLs: Minimal assistance;Rolling walker General ADL Comments: continues to have limitations due to weakness, decreased activity tolerance                       Cognition Arousal/Alertness: Awake/alert Behavior During Therapy: WFL for tasks assessed/performed Overall Cognitive Status: Within Functional Limits for tasks assessed Area of Impairment: Following commands;Problem solving                       Following Commands: Follows one step commands consistently Safety/Judgement: Decreased awareness of deficits(pt does not seem to  understand that he has TB and his plan.)   Problem Solving: Requires verbal cues          Exercises     Shoulder Instructions       General Comments pt on 3-4L during session, difficult to obtain accurace O2 readings but once able >90%    Pertinent Vitals/ Pain       Pain Assessment: Faces Faces Pain Scale: Hurts little more Pain Location: buttocks with prolonged sitting on harder surfaces (BSC today)` Pain Descriptors / Indicators: Discomfort;Sore Pain Intervention(s): Repositioned;Monitored during session  Home Living                                           Prior Functioning/Environment              Frequency  Min 2X/week        Progress Toward Goals  OT Goals(current goals can now be found in the care plan section)  Progress towards OT goals: Progressing toward goals  Acute Rehab OT Goals Patient Stated Goal: I want to get stronger so I can go home to my dog.  OT Goal Formulation: With patient Time For Goal Achievement: 05/31/19 Potential to Achieve Goals: Good ADL Goals Pt Will Perform Grooming: with min guard assist;standing Pt Will Transfer to Toilet: with min guard assist;bedside commode;ambulating Pt Will Perform Toileting - Clothing Manipulation and hygiene: with min guard assist;sit to/from stand Additional ADL Goal #1: pt will perform UB adls with set up and LB adls with min A using AE as needed Additional ADL Goal #2: pt will not need more than 1 safety cue per activity during bathing, dressing or toileting  Plan Discharge plan remains appropriate    Co-evaluation                 AM-PAC OT "6 Clicks" Daily Activity     Outcome Measure   Help from another person eating meals?: A Little Help from another person taking care of personal grooming?: A Little Help from another person toileting, which includes using toliet, bedpan, or urinal?: Total Help from another person bathing (including washing, rinsing, drying)?: A Lot Help from another person to put on and taking off regular upper body clothing?: A Lot Help from another person to put on and taking off regular lower body clothing?: A Lot 6 Click Score: 13    End of Session Equipment Utilized During Treatment: Rolling walker;Oxygen  OT Visit Diagnosis: Unsteadiness on feet (R26.81)   Activity Tolerance Patient tolerated treatment well   Patient Left in bed;with call bell/phone within reach;with bed alarm set   Nurse Communication Mobility status        Time: 1433-1550 OT Time Calculation  (min): 77 min  Charges: OT General Charges $OT Visit: 1 Visit OT Treatments $Self Care/Home Management : 68-82 mins  Lou Cal, [place] Pager [phone] Office [place] 05/30/2019, 4:08 PM

## 2019-05-30 NOTE — Care Management Important Message (Signed)
Important Message  Patient Details IM Letter given to [name] RN to present to the Patient Name: Clarence Maldonado MRN: [id number] Date of Birth: 02/24/1946   Medicare Important Message Given:  Yes     Kerin Salen 05/30/2019, 10:51 AM

## 2019-05-30 NOTE — TOC Progression Note (Signed)
Transition of Care [facility]) - Progression Note    Patient Details  Name: CHRISTAN DEFRANCO MRN: [id number] Date of Birth: [birth year]  [facility] CM/SW Contact  Purcell Mouton, RN Phone Number: 05/30/2019, 11:17 AM  Clinical Narrative:    Pt will continue to be here to gain his strength, work with PT until he is able to maintain himself at home. This is related to TB, unable to send pt to SNF or [place] into the home.    Expected Discharge Plan: [place] Barriers to Discharge: Continued Medical Work up  Expected Discharge Plan and Services Expected Discharge Plan: [place]   Discharge Planning Services: CM Consult   Living arrangements for the past 2 months: Mobile Home                                       Social Determinants of Health (SDOH) Interventions    Readmission Risk Interventions Readmission Risk Prevention Plan 05/23/2019  Transportation Screening Complete  PCP or Specialist Appt within 3-5 Days Not Complete  Not Complete comments Not ready for dc  HRI or [place] Complete  Social Work Consult for [place] Planning/Counseling Not Complete  SW consult not completed comments nA  Palliative Care Screening Not Applicable  Medication Review Press photographer) Complete  Some recent data might be hidden

## 2019-05-30 NOTE — Progress Notes (Signed)
  Speech Language Pathology Treatment: Dysphagia  Patient Details Name: Clarence Maldonado MRN: [id number] DOB: [birth year] Today's Date: 05/30/2019 Time: 1355-1430 SLP Time Calculation (min) (ACUTE ONLY): 35 min  Assessment / Plan / Recommendation Clinical Impression  Pt continues to eat slowly independently - ongoing report of discomfort on the right side of his throat - has reported this issue since his hospital = denies worsening or improving.  SLP questions if he may have some mild edema of epiglottis on his MBS but can not definitively identify.  Recommend after able to be discontinued from airborne precautions and isolation - he follow up with an ENT as an OP.     Intake has been great - he is consuming 100% of meals consistently and reports pleasure with its intake.    SLP provided pt with his lunch meal and educated him to swallow strategies and aspiration precautions.  Pt now states he eats all of his food prior to consuming his liquids - as eating all foods first help them to go down "smooth".  He will cough with meals if consuming liquids - which SLP was aware of until today.  This lends toward pt having chronic dysphagia prior to admit for which he has compensated.    He does tolerate puree food consumption well when not drinking liquids today without indication of airway compromise.  Encouraged pt to assure he drinks the liquids however between meals as he is not on IV fluids and needs to stay hydrated.    Advised RN of pt ability to consume thin water with meals - but doubt he will drink anything with meals based on conversation with him.  He enjoys the "juices" he receives with his meal per his statement *nectar.    Chin tuck did not improve pt's airway protection on MBS and thus is not recommended.  Advised RN to findings of test and precautions.   SLP to follow up with pt for RMST strengthen cough/voice and laryngeal elevation for airway protection. Pt agreeable to plan for RMST  and continued diet.  Thanks!      HPI  see HPI in chart      SLP Plan  Continue with current plan of care(RMST to strengthen pt's voice, laryngeal elevation/epiglottic deflection and cough for airway protection)       Recommendations  Diet recommendations: Dysphagia 1 (puree);Thin liquid(water with meals, ok for pt to have nectar thick if he desires) Liquids provided via: Cup;Straw Medication Administration: Whole meds with puree Supervision: Patient able to self feed Compensations: Minimize environmental distractions;Slow rate;Small sips/bites(informed pt of need to keep cough/hock strong for airway protection - to which he stated "I don't have to do that" - decreased understanding to information provided but able to demonstrate x1 and teach back reasoning) Postural Changes and/or Swallow Maneuvers: Seated upright 90 degrees;Upright 30-60 min after meal                Oral Care Recommendations: Oral care BID Follow up Recommendations: (tbd) SLP Visit Diagnosis: Dysphagia, unspecified (R13.10) Plan: Continue with current plan of care(RMST to strengthen pt's voice, laryngeal elevation/epiglottic deflection and cough for airway protection)       GO                [name] 05/30/2019, 3:25 PM  Luanna Salk, [place] [facility] SLP Acute Rehab Services Pager [phone] Office [phone]

## 2019-05-30 NOTE — Progress Notes (Signed)
Nutrition Follow-up  RD working remotely.   DOCUMENTATION CODES:   Not applicable  INTERVENTION:  - continue Ensure Enlive once/day and Magic Cup BID. - continue to encourage PO intakes.    NUTRITION DIAGNOSIS:   Inadequate oral intake related to poor appetite as evidenced by per patient/family report. -improving  GOAL:   Patient will meet greater than or equal to 90% of their needs -met on average   MONITOR:   PO intake, Supplement acceptance, Labs, Weight trends, I & O's  ASSESSMENT:   73 y.o. male with medical history significant of cardiomegaly, stroke, COPD,   CKD stage III, tobacco abuse, DM 2, HTN,  Admitted for cavitary lesions unclear etiology work-up for TB versus malignancy  Per RN flow sheet, patient consumed 100% of lunch and dinner on 8/20; 100% of breakfast on 8/21; 75% of breakfast and 100% of lunch on 8/22; 100% of breakfast and dinner on 8/23; 100% of breakfast on 8/25. Per review of order, patient has been accepting Ensure supplement ~50% of the time offered.   Per notes: - acute respiratory failure with hypoxia--d/t TB and aspiration PNA, health department notified d/t TB dx - extreme deconditioning and high fall risk  - unable to obtain SNF placement d/t need for airborne room - d/c plan at this time is for home with home health--possibly at the end of the week - dysphagia--high aspiration risk, on Dysphagia 1, nectar-thick liquids - throat pain with swallowing--no thrush, chloraseptic spray and magic mouthwash - peripheral neuropathy - iron deficiency anemia - controlled type 2 DM--HgbA1c: 6.3% - PCM   Labs reviewed; CBGs: 101, 93, 241, 72, and 154 mg/dl today, BUN: 44 mg/dl, creatinine: 1.45 mg/dl, GFR: 48 ml/min.  Medications reviewed; 325 mg ferrous sulfate BID, 50 mg vitamin B6/day, imodium PRN, daily multivitamin with minerals, 100 mg oral thiamine/day.      NUTRITION - FOCUSED PHYSICAL EXAM:  unable to complete for patient on airborne  precautions.  Diet Order:   Diet Order            DIET - DYS 1 Room service appropriate? Yes with Assist; Fluid consistency: Nectar Thick  Diet effective now              EDUCATION NEEDS:   No education needs have been identified at this time  Skin:  Skin Assessment: Reviewed RN Assessment  Last BM:  8/25  Height:   Ht Readings from Last 1 Encounters:  05/16/19 _0  (1.676 m)    Weight:   Wt Readings from Last 1 Encounters:  05/30/19 65.6 kg    Ideal Body Weight:  64.5 kg  BMI:  Body mass index is 23.34 kg/m.  Estimated Nutritional Needs:   Kcal:  1600-1800  Protein:  70-80g  Fluid:  1.8L/day     Jarome Matin, MS, RD, LDN, CNSC Inpatient Clinical Dietitian Pager # [phone] After hours/weekend pager # [phone]

## 2019-05-31 LAB — HEPATIC FUNCTION PANEL
ALT: 12 U/L (ref 0–44)
AST: 20 U/L (ref 15–41)
Albumin: 1.9 g/dL — ABNORMAL LOW (ref 3.5–5.0)
Alkaline Phosphatase: 80 U/L (ref 38–126)
Bilirubin, Direct: 0.1 mg/dL (ref 0.0–0.2)
Indirect Bilirubin: 0.6 mg/dL (ref 0.3–0.9)
Total Bilirubin: 0.7 mg/dL (ref 0.3–1.2)
Total Protein: 5.7 g/dL — ABNORMAL LOW (ref 6.5–8.1)

## 2019-05-31 LAB — GLUCOSE, CAPILLARY: Glucose-Capillary: 76 mg/dL (ref 70–99)

## 2019-05-31 LAB — FOLATE RBC
Folate, Hemolysate: 329 ng/mL
Folate, RBC: 1022 ng/mL (ref >498)
Hematocrit: 32.2 % — ABNORMAL LOW (ref 37.5–51.0)

## 2019-05-31 LAB — RPR: RPR Ser Ql: NONREACTIVE

## 2019-05-31 MED ORDER — METOPROLOL TARTRATE 5 MG/5ML IV SOLN
2.5000 mg | Freq: Once | INTRAVENOUS | Status: AC
  Administered 2019-05-31: 2.5 mg via INTRAVENOUS
  Filled 2019-05-31: qty 5

## 2019-05-31 NOTE — Progress Notes (Signed)
PROGRESS NOTE    Clarence Maldonado  KDT:[id number] DOB: 05/07/1946 DOA: 05/15/2019 PCP: Gayland Curry, DO    Brief Narrative:  73 year old male with history of stroke, COPD, CKD stage III, tobacco use, diabetes mellitus type 2, hypertension presented to the ED with progressive weakness for past 3 months with frequent falls.  He called EMS after a fall.  Reported poor p.o. intake and losing almost 43 pounds in the past 2 months.  No prior history of TB or exposure.  In the ED COVID-19 was tested negative.  CT of the chest showed multiple cavitary lesion suggestive of malignancy versus TB/atypical infection. Sputum for AFB and QuantiFERON gold positive.  MTB PCR also tested positive.  Hospital course complicated with patient developing acute hypoxic respiratory failure on 8/11 with ABG showing significant hypoxemia and hypercapnia (PO2 57 and PCO2 45).  Placed on empiric IV antibiotics.  Also found to have significant dysphagia.  Assessment & Plan:   Principal Problem:   Aspiration pneumonia ([place]) Active Problems:   Essential hypertension   CKD (chronic kidney disease) stage 3, GFR 30-59 ml/min (HCC)   Hyperlipidemia   Tobacco use disorder   Controlled type 2 diabetes mellitus with stage 3 chronic kidney disease, without long-term current use of insulin (HCC)   Weight loss   Hyperlipidemia associated with type 2 diabetes mellitus ([place])   Cavitary lesion of lung   Hypokalemia   Cavitary pneumonia   Anemia   Principal Problem: Acute respiratory failure with hypoxia (HCC) Secondary to pulmonary TB (MTB PCR positive).  Also concerns with aspiration pneumonia.  ID consult appreciated and started on 4 drug therapy (rifampin, isoniazid by acetamide and ethambutol for 2 months to cover both latent and presumed active TB. continue B6 and thiamine.  Health department notified. ID recommends repeat AFB sputum on day 14 to assess burden  Currently on 3 L via nasal cannula.  Cont to wean O2  as tolerated  Continue airborne isolation. Completed antibiotics for aspiration pneumonia.  Concerns of difficulty with SNF placement given airborne isolation status. D/c planning in progress. Possibility for home with home health later  Active Problems: Dysphagia Moderate oropharyngeal dysphagia with sensorimotor deficit noted on MBS also noted hypomotility of the pharynx.  High aspiration risk.  Continue dysphagia level 1 diet. Stable at present  Throat pain on swallowing Continue Chloraseptic spray with Magic mouthwash.  No thrush noted recently  Peripheral neuropathy Possibly associated with isoniazid/ ?  Rifampin.  Continue B6.  He does have iron deficiency anemia and started on supplement.  Reports some improvement.  TSH 2 months back was normal, repeat of 4.117.   B12 of 483.  RPR non reactive   ?  Mass in left atrium on 2D echo Unable to rule out vegetation.  Comments on severe thickening of the mitral valve leaflet with severe calcification with severe and bulky posterior annular calcification.  Subcostal images suggest mobile mass in the right atrium.  Cardiology consulted and Dr. Rozanna Box had discussed with Dr. Meda Coffee who reviewed the echo and suggested the mass might be reflective of nonspecific congenital abnormality.  Recommends she will arrange outpatient follow-up once pulmonary issues resolve.  Nonsustained V. tach In the setting of persistent hypokalemia and hypomagnesemia.  Replenished.  Stable on telemetry and discontinued. Would repeat bmet in AM  Hypokalemia Resolved with supplement.   Iron deficiency anemia Low iron panel, normal B12 (483). Hemoccult negative.  Added iron supplement. Will repeat cbc in AM   Type 2 diabetes  mellitus, controlled Secondary to severe weight loss and poor p.o. intake.  A1c of 6.3.  Monitor on sliding scale coverage only.  Adequate p.o. intake encouraged Glucose trends stable   Weight loss with protein calorie  malnutrition, Secondary to underlying TB and failure to thrive.  Nutrition and PT consult appreciated.  Would benefit from SNF, d/c planning in progress   Chronic kidney disease stage 3 Stable. Avoid nephrotoxins. Repeat bmet in AM   DVT prophylaxis: Lovenox subQ Code Status: Full Family Communication: Pt in room, family not at bedside Disposition Plan: Uncertain at this time  Consultants:   ID  Pulmonary  Procedures:     Antimicrobials: Anti-infectives (From admission, onward)   Start     Dose/Rate Route Frequency Ordered Stop   05/24/19 1000  pyrazinamide tablet 1,500 mg     1,500 mg Oral Daily 05/23/19 1626     05/24/19 1000  ethambutol (MYAMBUTOL) tablet 1,200 mg     1,200 mg Oral Daily 05/23/19 1626     05/19/19 1600  isoniazid (NYDRAZID) tablet 300 mg     300 mg Oral Daily 05/19/19 1428     05/19/19 1600  rifampin (RIFADIN) capsule 600 mg     600 mg Oral Daily 05/19/19 1428     05/19/19 1600  pyrazinamide tablet 1,000 mg  Status:  Discontinued     1,000 mg Oral Daily 05/19/19 1428 05/23/19 1626   05/19/19 1600  ethambutol (MYAMBUTOL) tablet 800 mg  Status:  Discontinued     800 mg Oral Daily 05/19/19 1428 05/23/19 1626   05/18/19 0000  vancomycin (VANCOCIN) IVPB 1000 mg/200 mL premix  Status:  Discontinued     1,000 mg 200 mL/hr over 60 Minutes Intravenous Every 36 hours 05/16/19 1219 05/17/19 1058   05/16/19 1130  vancomycin (VANCOCIN) 1,250 mg in sodium chloride 0.9 % 250 mL IVPB     1,250 mg 166.7 mL/hr over 90 Minutes Intravenous  Once 05/16/19 1100 05/16/19 1404   05/16/19 1000  ampicillin-sulbactam (UNASYN) 1.5 g in sodium chloride 0.9 % 100 mL IVPB     1.5 g 200 mL/hr over 30 Minutes Intravenous Every 6 hours 05/16/19 0952 05/23/19 2359       Subjective: Eager to go home  Objective: Vitals:   05/30/19 2141 05/31/19 0526 05/31/19 0531 05/31/19 1358  BP: 122/85 135/69  139/81  Pulse: 99 93  (!) 108  Resp: 20 20  20   Temp: 98.5 F (36.9 C)  97.7 F (36.5 C)  98.1 F (36.7 C)  TempSrc: Oral Oral  Oral  SpO2: 99% 98%  96%  Weight:   63.1 kg   Height:        Intake/Output Summary (Last 24 hours) at 05/31/2019 1549 Last data filed at 05/31/2019 0533 Gross per 24 hour  Intake 360 ml  Output 1350 ml  Net -990 ml   Filed Weights   05/29/19 0500 05/30/19 0459 05/31/19 0531  Weight: 63.5 kg 65.6 kg 63.1 kg    Examination:  General exam: Appears calm and comfortable  Respiratory system: no audible wheezing, Respiratory effort normal. Cardiovascular system: perfused, no cyanosis Gastrointestinal system: nondistended, soft Central nervous system: Alert and oriented. No focal neurological deficits. Extremities: Symmetric 5 x 5 power. Skin: No rashes, lesions Psychiatry: Judgement and insight appear normal. Mood & affect appropriate.   Data Reviewed: I have personally reviewed following labs and imaging studies  CBC: No results for input(s): WBC, NEUTROABS, HGB, HCT, MCV, PLT in the last 168 hours.  Basic Metabolic Panel: Recent Labs  Lab 05/25/19 0445 05/26/19 0420 05/28/19 0557  NA 140 141 143  K 3.4* 3.5 4.4  CL 102 101 103  CO2 32 29 32  GLUCOSE 92 93 180*  BUN 40* 35* 44*  CREATININE 1.40* 1.35* 1.45*  CALCIUM 8.6* 8.8* 9.2   GFR: Estimated Creatinine Clearance: 41.1 mL/min (A) (by C-G formula based on SCr of 1.45 mg/dL (H)). Liver Function Tests: Recent Labs  Lab 05/31/19 0431  AST 20  ALT 12  ALKPHOS 80  BILITOT 0.7  PROT 5.7*  ALBUMIN 1.9*   No results for input(s): LIPASE, AMYLASE in the last 168 hours. No results for input(s): AMMONIA in the last 168 hours. Coagulation Profile: No results for input(s): INR, PROTIME in the last 168 hours. Cardiac Enzymes: No results for input(s): CKTOTAL, CKMB, CKMBINDEX, TROPONINI in the last 168 hours. BNP (last 3 results) No results for input(s): PROBNP in the last 8760 hours. HbA1C: No results for input(s): HGBA1C in the last 72 hours. CBG: Recent  Labs  Lab 05/28/19 0809 05/28/19 1146 05/29/19 0831 05/30/19 0616 05/31/19 0523  GLUCAP 93 241* 72 154* 76   Lipid Profile: No results for input(s): CHOL, HDL, LDLCALC, TRIG, CHOLHDL, LDLDIRECT in the last 72 hours. Thyroid Function Tests: Recent Labs    05/30/19 1552  TSH 4.117   Anemia Panel: No results for input(s): VITAMINB12, FOLATE, FERRITIN, TIBC, IRON, RETICCTPCT in the last 72 hours. Sepsis Labs: No results for input(s): PROCALCITON, LATICACIDVEN in the last 168 hours.  No results found for this or any previous visit (from the past 240 hour(s)).   Radiology Studies: No results found.  Scheduled Meds: . buPROPion  150 mg Oral Daily  . enoxaparin (LOVENOX) injection  40 mg Subcutaneous Q24H  . ethambutol  1,200 mg Oral Daily  . feeding supplement (ENSURE ENLIVE)  237 mL Oral Q24H  . ferrous sulfate  325 mg Oral BID WC  . ipratropium  2 puff Inhalation TID  . isoniazid  300 mg Oral Daily   And  . vitamin B-6  50 mg Oral Daily  . mouth rinse  15 mL Mouth Rinse BID  . mometasone-formoterol  2 puff Inhalation BID  . multivitamin with minerals  1 tablet Oral Daily  . pantoprazole  40 mg Oral Q0600  . pravastatin  20 mg Oral q1800  . pyrazinamide  1,500 mg Oral Daily  . rifampin  600 mg Oral Daily  . thiamine  100 mg Oral Daily   Continuous Infusions: . sodium chloride Stopped (05/23/19 0250)     LOS: 16 days   Rickey Barbara, MD [facility] Pager On Amion  If 7PM-7AM, please contact night-coverage 05/31/2019, 3:49 PM

## 2019-05-31 NOTE — Progress Notes (Signed)
Physical Therapy Treatment Patient Details Name: Clarence Maldonado MRN: [id number] DOB: 1946-05-07 Today's Date: 05/31/2019    History of Present Illness 73 y.o. male with medical history significant of cardiomegaly, stroke, COPD,   CKD stage III, tobacco abuse, DM 2, HTN who presented to the emergency room on 8/10 with progressive weakness over the last 3 months to the point where he was falling over. He has lost approximately 43 pounds in the past 2 months. CT scan of chest noted multiple cavitary lesions of unclear etiology with a differential diagnosis of malignancy versus TB versus atypical infection.    PT Comments    Pt tolerated session well. He is so motivated to get better and continue to try to educate him about why he is here and what is causing his breathing difficulties because he keeps asking. With very little movement he gets SOB , dyspnea about 3/4 with short exercises or 10 feet RW ambulation in room  With recover seated with cues for breathing for about 3-4 minute recovery.     Follow Up Recommendations  (however aware SNF may not be able to take him so will need HHPT once safe for DC home.)     Equipment Recommendations  Rolling walker with 5" wheels(may need WC and rollator when DC gets closer TBD)    Recommendations for Other Services       Precautions / Restrictions Precautions Precautions: Fall Precaution Comments: multiple falls Restrictions Weight Bearing Restrictions: No    Mobility  Bed Mobility Overal bed mobility: Needs Assistance Bed Mobility: Supine to Sit     Supine to sit: Min guard Sit to supine: Min guard   General bed mobility comments: for safety  Transfers Overall transfer level: Needs assistance Equipment used: Rolling walker (2 wheeled) Transfers: Sit to/from Stand Sit to Stand: Min assist Stand pivot transfers: Min assist       General transfer comment: cues for safety with hand placement and minA to steady upon rising and  once standing with a little posterior sway  Ambulation/Gait Ambulation/Gait assistance: Min assist Gait Distance (Feet): 10 Feet Assistive device: Rolling walker (2 wheeled) Gait Pattern/deviations: Step-through pattern     General Gait Details: slow steady , but required rest break about 3 -5 minutes in between due to dyspnea 3/4 with little walk in room   Stairs             Wheelchair Mobility    Modified Rankin (Stroke Patients Only)       Balance                                            Cognition Arousal/Alertness: Awake/alert Behavior During Therapy: WFL for tasks assessed/performed Overall Cognitive Status: Within Functional Limits for tasks assessed Area of Impairment: Following commands;Problem solving                       Following Commands: Follows one step commands consistently Safety/Judgement: Decreased awareness of deficits(pt does not seem to understand that he has TB and his plan.)   Problem Solving: Requires verbal cues        Exercises Other Exercises Other Exercises: EOB knee esxtension 15x BLE, holding 2 seconds in extension. Then UE reaching overhead 15x . All; for activity tolerance and trunk strengthening. Then baloon tap ( made of small trash bag) for 2 minutes before  needed rest break to catch breath. Also stood at widow for 3 minute interval for tlerance as well. All required great breaks in between    General Comments        Pertinent Vitals/Pain Pain Assessment: No/denies pain    Home Living                      Prior Function            PT Goals (current goals can now be found in the care plan section) Acute Rehab PT Goals Patient Stated Goal: I want to get stronger so I can go home to my dog.  Time For Goal Achievement: 06/19/19    Frequency    Min 3X/week      PT Plan Current plan remains appropriate    Co-evaluation              AM-PAC PT "6 Clicks" Mobility    Outcome Measure  Help needed turning from your back to your side while in a flat bed without using bedrails?: A Little Help needed moving from lying on your back to sitting on the side of a flat bed without using bedrails?: A Little Help needed moving to and from a bed to a chair (including a wheelchair)?: A Little Help needed standing up from a chair using your arms (e.g., wheelchair or bedside chair)?: A Little Help needed to walk in hospital room?: A Lot Help needed climbing 3-5 steps with a railing? : A Lot 6 Click Score: 16    End of Session Equipment Utilized During Treatment: Gait belt Activity Tolerance: Patient tolerated treatment well Patient left: in chair Nurse Communication: Mobility status PT Visit Diagnosis: Unsteadiness on feet (R26.81);Muscle weakness (generalized) (M62.81);Difficulty in walking, not elsewhere classified (R26.2);Adult, failure to thrive (R62.7);History of falling (Z91.81);Repeated falls (R29.6)     Time: 8592-9244 PT Time Calculation (min) (ACUTE ONLY): 53 min  Charges:  $Gait Training: 8-22 mins $Therapeutic Exercise: 23-37 mins $Therapeutic Activity: 8-22 mins                     Marella Bile, PT Acute Rehabilitation Services Pager: [phone] Office: [phone] 05/31/2019    Marella Bile 05/31/2019, 4:30 PM

## 2019-06-01 LAB — CBC
HCT: 30.6 % — ABNORMAL LOW (ref 39.0–52.0)
Hemoglobin: 8.9 g/dL — ABNORMAL LOW (ref 13.0–17.0)
MCH: 24 pg — ABNORMAL LOW (ref 26.0–34.0)
MCHC: 29.1 g/dL — ABNORMAL LOW (ref 30.0–36.0)
MCV: 82.5 fL (ref 80.0–100.0)
Platelets: 236 10*3/uL (ref 150–400)
RBC: 3.71 MIL/uL — ABNORMAL LOW (ref 4.22–5.81)
RDW: 21.1 % — ABNORMAL HIGH (ref 11.5–15.5)
WBC: 8.6 10*3/uL (ref 4.0–10.5)
nRBC: 0 % (ref 0.0–0.2)

## 2019-06-01 LAB — AFB ORGANISM ID BY DNA PROBE
M avium complex: NEGATIVE
M avium complex: NEGATIVE
M tuberculosis complex: POSITIVE — AB
M tuberculosis complex: POSITIVE — AB

## 2019-06-01 LAB — BASIC METABOLIC PANEL
Anion gap: 10 (ref 5–15)
BUN: 34 mg/dL — ABNORMAL HIGH (ref 8–23)
CO2: 30 mmol/L (ref 22–32)
Calcium: 8.6 mg/dL — ABNORMAL LOW (ref 8.9–10.3)
Chloride: 96 mmol/L — ABNORMAL LOW (ref 98–111)
Creatinine, Ser: 1.55 mg/dL — ABNORMAL HIGH (ref 0.61–1.24)
GFR calc Af Amer: 51 mL/min — ABNORMAL LOW (ref >60)
GFR calc non Af Amer: 44 mL/min — ABNORMAL LOW (ref >60)
Glucose, Bld: 108 mg/dL — ABNORMAL HIGH (ref 70–99)
Potassium: 4.2 mmol/L (ref 3.5–5.1)
Sodium: 136 mmol/L (ref 135–145)

## 2019-06-01 LAB — ACID FAST CULTURE WITH REFLEXED SENSITIVITIES (MYCOBACTERIA)
Acid Fast Culture: POSITIVE — AB
Acid Fast Culture: POSITIVE — AB

## 2019-06-01 LAB — GLUCOSE, CAPILLARY: Glucose-Capillary: 100 mg/dL — ABNORMAL HIGH (ref 70–99)

## 2019-06-01 MED ORDER — HYDROCORTISONE 1 % EX CREA
TOPICAL_CREAM | Freq: Three times a day (TID) | CUTANEOUS | Status: DC | PRN
  Administered 2019-06-01 – 2019-06-07 (×2): via TOPICAL
  Filled 2019-06-01: qty 28

## 2019-06-01 MED ORDER — CLOTRIMAZOLE 1 % EX CREA
TOPICAL_CREAM | Freq: Two times a day (BID) | CUTANEOUS | Status: DC
  Administered 2019-06-01 – 2019-06-25 (×23): via TOPICAL
  Administered 2019-06-27: 1 via TOPICAL
  Administered 2019-07-02 – 2019-07-24 (×21): via TOPICAL
  Filled 2019-06-01 (×5): qty 15

## 2019-06-01 NOTE — Progress Notes (Signed)
PROGRESS NOTE    Clarence Maldonado  TMH:[id number] DOB: 01-21-1946 DOA: 05/15/2019 PCP: [name], DO    Brief Narrative:  73 year old male with history of stroke, COPD, CKD stage III, tobacco use, diabetes mellitus type 2, hypertension presented to the ED with progressive weakness for past 3 months with frequent falls.  He called EMS after a fall.  Reported poor p.o. intake and losing almost 43 pounds in the past 2 months.  No prior history of TB or exposure.  In the ED COVID-19 was tested negative.  CT of the chest showed multiple cavitary lesion suggestive of malignancy versus TB/atypical infection. Sputum for AFB and QuantiFERON gold positive.  MTB PCR also tested positive.  Hospital course complicated with patient developing acute hypoxic respiratory failure on 8/11 with ABG showing significant hypoxemia and hypercapnia (PO2 57 and PCO2 45).  Placed on empiric IV antibiotics.  Also found to have significant dysphagia.  Assessment & Plan:   Principal Problem:   Aspiration pneumonia (HCC) Active Problems:   Essential hypertension   CKD (chronic kidney disease) stage 3, GFR 30-59 ml/min (HCC)   Hyperlipidemia   Tobacco use disorder   Controlled type 2 diabetes mellitus with stage 3 chronic kidney disease, without long-term current use of insulin (HCC)   Weight loss   Hyperlipidemia associated with type 2 diabetes mellitus (HCC)   Cavitary lesion of lung   Hypokalemia   Cavitary pneumonia   Anemia   Principal Problem: Acute respiratory failure with hypoxia (HCC) Secondary to pulmonary TB (MTB PCR positive).  Also concerns with aspiration pneumonia.  ID consult appreciated and started on 4 drug therapy (rifampin, isoniazid by acetamide and ethambutol for 2 months to cover both latent and presumed active TB. continue B6 and thiamine.  Health department notified. ID recommends repeat AFB sputum on day 14 to assess burden  Weaned to 1 L via nasal cannula.  Cont to wean O2 as  tolerated. Pt is O2 naive  Continue airborne isolation. Completed antibiotics for aspiration pneumonia.  Concerns of difficulty with SNF placement given airborne isolation status. D/c planning still in progress. Possibility for arranging home health   Active Problems: Dysphagia Moderate oropharyngeal dysphagia with sensorimotor deficit noted on MBS also noted hypomotility of the pharynx.  High aspiration risk.  Continue dysphagia level 1 diet. Presently stable  Throat pain on swallowing Continue Chloraseptic spray with Magic mouthwash.  No thrush recently noted  Peripheral neuropathy Possibly associated with isoniazid/ ?  Rifampin.  Continue B6.  He does have iron deficiency anemia and started on supplement.  Reports some improvement.  TSH 2 months back was normal, repeat of 4.117.   labs reviewed. B12 of 483.  RPR non reactive  ?  Mass in left atrium on 2D echo Unable to rule out vegetation.  Comments on severe thickening of the mitral valve leaflet with severe calcification with severe and bulky posterior annular calcification.  Subcostal images suggest mobile mass in the right atrium.  Cardiology consulted and Dr. Johney Maine had discussed with Dr. Delton See who reviewed the echo and suggested the mass might be reflective of nonspecific congenital abnormality. Cardiology to follow up as outpatient  Nonsustained V. tach In the setting of persistent hypokalemia and hypomagnesemia.  replaced electrolytes.  Stable on telemetry and discontinued. Would repeat bmet in AM  Hypokalemia Resolved, labs reviewed.  Iron deficiency anemia Low iron panel, normal B12 (483). Hemoccult negative.  Added iron supplement. Remains hemodynamically stable  Type 2 diabetes mellitus, controlled Secondary to  severe weight loss and poor p.o. intake.  A1c of 6.3.  Monitor on sliding scale coverage only.  Adequate p.o. intake encouraged Glucose trends remain stable   Weight loss with protein calorie  malnutrition, Secondary to underlying TB and failure to thrive.  Nutrition and PT consult appreciated.  Would benefit from SNF, however disoposition issues given airborne precautions  Chronic kidney disease stage 3 Remains stable. Avoid nephrotoxins.   DVT prophylaxis: Lovenox subQ Code Status: Full Family Communication: Pt in room, family not at bedside Disposition Plan: Uncertain at this time  Consultants:   ID  Pulmonary  Procedures:     Antimicrobials: Anti-infectives (From admission, onward)   Start     Dose/Rate Route Frequency Ordered Stop   05/24/19 1000  pyrazinamide tablet 1,500 mg     1,500 mg Oral Daily 05/23/19 1626     05/24/19 1000  ethambutol (MYAMBUTOL) tablet 1,200 mg     1,200 mg Oral Daily 05/23/19 1626     05/19/19 1600  isoniazid (NYDRAZID) tablet 300 mg     300 mg Oral Daily 05/19/19 1428     05/19/19 1600  rifampin (RIFADIN) capsule 600 mg     600 mg Oral Daily 05/19/19 1428     05/19/19 1600  pyrazinamide tablet 1,000 mg  Status:  Discontinued     1,000 mg Oral Daily 05/19/19 1428 05/23/19 1626   05/19/19 1600  ethambutol (MYAMBUTOL) tablet 800 mg  Status:  Discontinued     800 mg Oral Daily 05/19/19 1428 05/23/19 1626   05/18/19 0000  vancomycin (VANCOCIN) IVPB 1000 mg/200 mL premix  Status:  Discontinued     1,000 mg 200 mL/hr over 60 Minutes Intravenous Every 36 hours 05/16/19 1219 05/17/19 1058   05/16/19 1130  vancomycin (VANCOCIN) 1,250 mg in sodium chloride 0.9 % 250 mL IVPB     1,250 mg 166.7 mL/hr over 90 Minutes Intravenous  Once 05/16/19 1100 05/16/19 1404   05/16/19 1000  ampicillin-sulbactam (UNASYN) 1.5 g in sodium chloride 0.9 % 100 mL IVPB     1.5 g 200 mL/hr over 30 Minutes Intravenous Every 6 hours 05/16/19 0952 05/23/19 2359      Subjective: Very eager to go home  Objective: Vitals:   06/01/19 0800 06/01/19 1201 06/01/19 1230 06/01/19 1559  BP: 122/79 137/74  135/76  Pulse: 90 93  (!) 101  Resp: 18 20  20   Temp:  97.7 F (36.5 C) 98.1 F (36.7 C)  98.3 F (36.8 C)  TempSrc: Oral Oral  Oral  SpO2: 100% 95% (!) 88% 93%  Weight:      Height:        Intake/Output Summary (Last 24 hours) at 06/01/2019 1712 Last data filed at 06/01/2019 1208 Gross per 24 hour  Intake -  Output 1150 ml  Net -1150 ml   Filed Weights   05/30/19 0459 05/31/19 0531 06/01/19 0500  Weight: 65.6 kg 63.1 kg 62.5 kg    Examination: General exam: Awake, laying in bed, in nad Respiratory system: Normal respiratory effort, no wheezing Cardiovascular system: regular rate, s1, s2 Gastrointestinal system: Soft, nondistended, positive BS Central nervous system: CN2-12 grossly intact, strength intact Extremities: Perfused, no clubbing Skin: Normal skin turgor, no cyanosis Psychiatry: Mood normal // no visual hallucinations   Data Reviewed: I have personally reviewed following labs and imaging studies  CBC: Recent Labs  Lab 05/30/19 1552 06/01/19 0611  WBC  --  8.6  HGB  --  8.9*  HCT 32.2* 30.6*  MCV  --  82.5  PLT  --  161   Basic Metabolic Panel: Recent Labs  Lab 05/26/19 0420 05/28/19 0557 06/01/19 0611  NA 141 143 136  K 3.5 4.4 4.2  CL 101 103 96*  CO2 29 32 30  GLUCOSE 93 180* 108*  BUN 35* 44* 34*  CREATININE 1.35* 1.45* 1.55*  CALCIUM 8.8* 9.2 8.6*   GFR: Estimated Creatinine Clearance: 38.1 mL/min (A) (by C-G formula based on SCr of 1.55 mg/dL (H)). Liver Function Tests: Recent Labs  Lab 05/31/19 0431  AST 20  ALT 12  ALKPHOS 80  BILITOT 0.7  PROT 5.7*  ALBUMIN 1.9*   No results for input(s): LIPASE, AMYLASE in the last 168 hours. No results for input(s): AMMONIA in the last 168 hours. Coagulation Profile: No results for input(s): INR, PROTIME in the last 168 hours. Cardiac Enzymes: No results for input(s): CKTOTAL, CKMB, CKMBINDEX, TROPONINI in the last 168 hours. BNP (last 3 results) No results for input(s): PROBNP in the last 8760 hours. HbA1C: No results for input(s):  HGBA1C in the last 72 hours. CBG: Recent Labs  Lab 05/28/19 0809 05/28/19 1146 05/29/19 0831 05/30/19 0616 05/31/19 0523  GLUCAP 93 241* 72 154* 76   Lipid Profile: No results for input(s): CHOL, HDL, LDLCALC, TRIG, CHOLHDL, LDLDIRECT in the last 72 hours. Thyroid Function Tests: Recent Labs    05/30/19 1552  TSH 4.117   Anemia Panel: No results for input(s): VITAMINB12, FOLATE, FERRITIN, TIBC, IRON, RETICCTPCT in the last 72 hours. Sepsis Labs: No results for input(s): PROCALCITON, LATICACIDVEN in the last 168 hours.  No results found for this or any previous visit (from the past 240 hour(s)).   Radiology Studies: No results found.  Scheduled Meds: . buPROPion  150 mg Oral Daily  . enoxaparin (LOVENOX) injection  40 mg Subcutaneous Q24H  . ethambutol  1,200 mg Oral Daily  . feeding supplement (ENSURE ENLIVE)  237 mL Oral Q24H  . ferrous sulfate  325 mg Oral BID WC  . ipratropium  2 puff Inhalation TID  . isoniazid  300 mg Oral Daily   And  . vitamin B-6  50 mg Oral Daily  . mouth rinse  15 mL Mouth Rinse BID  . mometasone-formoterol  2 puff Inhalation BID  . multivitamin with minerals  1 tablet Oral Daily  . pantoprazole  40 mg Oral Q0600  . pravastatin  20 mg Oral q1800  . pyrazinamide  1,500 mg Oral Daily  . rifampin  600 mg Oral Daily  . thiamine  100 mg Oral Daily   Continuous Infusions: . sodium chloride Stopped (05/23/19 0250)     LOS: 17 days   Marylu Lund, MD [facility] Pager On Amion  If 7PM-7AM, please contact night-coverage 06/01/2019, 5:12 PM

## 2019-06-01 NOTE — Progress Notes (Signed)
Upon entering room Clarence Maldonado. was sleeping and awakened confused. This RN assisted Clarence Maldonado. in sliding up in bed. Clarence Maldonado. breathing was slightly labored. This RN proceeded to give 2200 inhalers, then took vital signs. HR was sustaining at 130,RR 26, O2 sat 95%.MEWS increased to 5. MEWS protocol was initiated, On-call was notified, orders were given. Charge RN notified Rapid response, orders were given.Vitals were taken per protocol. Clarence Maldonado. Is stable and will continue to monitor.   MEWS Guidelines - (patients age 19 and over)  Red - At High Risk for Deterioration Yellow - At risk for Deterioration  1. Go to room and assess patient 2. Validate data. Is this patient's baseline? If data confirmed: 3. Is this an acute change? 4. Administer prn meds/treatments as ordered. 5. Note Sepsis score 6. Review goals of care 7. Sports coach, RRT nurse and Provider. 8. Ask Provider to come to bedside.  9. Document patient condition/interventions/response. 10. Increase frequency of vital signs and focused assessments to at least q15 minutes x 4, then q30 minutes x2. - If stable, then q1h x3, then q4h x3 and then q8h or dept. routine. - If unstable, contact Provider & RRT nurse. Prepare for possible transfer. 11. Add entry in progress notes using the smart phrase ".MEWS". 1. Go to room and assess patient 2. Validate data. Is this patient's baseline? If data confirmed: 3. Is this an acute change? 4. Administer prn meds/treatments as ordered? 5. Note Sepsis score 6. Review goals of care 7. Sports coach and Provider 8. Call RRT nurse as needed. 9. Document patient condition/interventions/response. 10. Increase frequency of vital signs and focused assessments to at least q2h x2. - If stable, then q4h x2 and then q8h or dept. routine. - If unstable, contact Provider & RRT nurse. Prepare for possible transfer. 11. Add entry in progress notes using the smart phrase ".MEWS".  Green - Likely stable Lavender  - Comfort Care Only  1. Continue routine/ordered monitoring.  2. Review goals of care. 1. Continue routine/ordered monitoring. 2. Review goals of care.

## 2019-06-01 NOTE — Plan of Care (Signed)
Patient tolerating dysphagia 1 diet with nectar thick liquids.  No c/o pain this shift.  Able to wean patient down to 1 liter O2, dropped to 88% on room air.  Patient up with one assist to [facility], refused to get up to chair this shift.  This RN talked to patients support person, Jerlyn Ly who will be assisting patient once he is at home.

## 2019-06-01 NOTE — Progress Notes (Deleted)
MEWS Guidelines - (patients age 72 and over)  Red - At High Risk for Deterioration Yellow - At risk for Deterioration  1. Go to room and assess patient 2. Validate data. Is this patient's baseline? If data confirmed: 3. Is this an acute change? 4. Administer prn meds/treatments as ordered. 5. Note Sepsis score 6. Review goals of care 7. Notify Charge Nurse, RRT nurse and Provider. 8. Ask Provider to come to bedside.  9. Document patient condition/interventions/response. 10. Increase frequency of vital signs and focused assessments to at least q15 minutes x 4, then q30 minutes x2. - If stable, then q1h x3, then q4h x3 and then q8h or dept. routine. - If unstable, contact Provider & RRT nurse. Prepare for possible transfer. 11. Add entry in progress notes using the smart phrase ".MEWS". 1. Go to room and assess patient 2. Validate data. Is this patient's baseline? If data confirmed: 3. Is this an acute change? 4. Administer prn meds/treatments as ordered? 5. Note Sepsis score 6. Review goals of care 7. Notify Charge Nurse and Provider 8. Call RRT nurse as needed. 9. Document patient condition/interventions/response. 10. Increase frequency of vital signs and focused assessments to at least q2h x2. - If stable, then q4h x2 and then q8h or dept. routine. - If unstable, contact Provider & RRT nurse. Prepare for possible transfer. 11. Add entry in progress notes using the smart phrase ".MEWS".  Green - Likely stable Lavender - Comfort Care Only  1. Continue routine/ordered monitoring.  2. Review goals of care. 1. Continue routine/ordered monitoring. 2. Review goals of care.     

## 2019-06-02 LAB — CBC WITH DIFFERENTIAL/PLATELET
Abs Immature Granulocytes: 0.03 10*3/uL (ref 0.00–0.07)
Basophils Absolute: 0 10*3/uL (ref 0.0–0.1)
Basophils Relative: 1 %
Eosinophils Absolute: 0.2 10*3/uL (ref 0.0–0.5)
Eosinophils Relative: 4 %
HCT: 29.4 % — ABNORMAL LOW (ref 39.0–52.0)
Hemoglobin: 8.6 g/dL — ABNORMAL LOW (ref 13.0–17.0)
Immature Granulocytes: 1 %
Lymphocytes Relative: 17 %
Lymphs Abs: 0.9 10*3/uL (ref 0.7–4.0)
MCH: 24.1 pg — ABNORMAL LOW (ref 26.0–34.0)
MCHC: 29.3 g/dL — ABNORMAL LOW (ref 30.0–36.0)
MCV: 82.4 fL (ref 80.0–100.0)
Monocytes Absolute: 0.4 10*3/uL (ref 0.1–1.0)
Monocytes Relative: 8 %
Neutro Abs: 3.7 10*3/uL (ref 1.7–7.7)
Neutrophils Relative %: 69 %
Platelets: 216 10*3/uL (ref 150–400)
RBC: 3.57 MIL/uL — ABNORMAL LOW (ref 4.22–5.81)
RDW: 21.9 % — ABNORMAL HIGH (ref 11.5–15.5)
WBC: 5.2 10*3/uL (ref 4.0–10.5)
nRBC: 0 % (ref 0.0–0.2)

## 2019-06-02 LAB — GLUCOSE, CAPILLARY: Glucose-Capillary: 115 mg/dL — ABNORMAL HIGH (ref 70–99)

## 2019-06-02 MED ORDER — RIFAMPIN 300 MG PO CAPS
600.0000 mg | ORAL_CAPSULE | Freq: Every day | ORAL | Status: DC
  Administered 2019-06-03 – 2019-07-25 (×53): 600 mg via ORAL
  Filled 2019-06-02 (×53): qty 2

## 2019-06-02 MED ORDER — ISONIAZID 300 MG PO TABS
300.0000 mg | ORAL_TABLET | Freq: Every day | ORAL | Status: DC
  Administered 2019-06-03 – 2019-07-25 (×53): 300 mg via ORAL
  Filled 2019-06-02 (×53): qty 1

## 2019-06-02 MED ORDER — ETHAMBUTOL HCL 400 MG PO TABS
1200.0000 mg | ORAL_TABLET | Freq: Every day | ORAL | Status: DC
  Administered 2019-06-03 – 2019-06-27 (×25): 1200 mg via ORAL
  Filled 2019-06-02 (×25): qty 3

## 2019-06-02 MED ORDER — VITAMIN B-6 50 MG PO TABS
50.0000 mg | ORAL_TABLET | Freq: Every day | ORAL | Status: DC
  Administered 2019-06-03 – 2019-07-25 (×53): 50 mg via ORAL
  Filled 2019-06-02 (×54): qty 1

## 2019-06-02 MED ORDER — PYRAZINAMIDE 500 MG PO TABS
1500.0000 mg | ORAL_TABLET | Freq: Every day | ORAL | Status: DC
  Administered 2019-06-03 – 2019-07-18 (×46): 1500 mg via ORAL
  Filled 2019-06-02 (×46): qty 3

## 2019-06-02 MED ORDER — LORATADINE 10 MG PO TABS
10.0000 mg | ORAL_TABLET | Freq: Every day | ORAL | Status: DC
  Administered 2019-06-04 – 2019-07-25 (×51): 10 mg via ORAL
  Filled 2019-06-02 (×53): qty 1

## 2019-06-02 MED ORDER — LORATADINE 10 MG PO TABS
10.0000 mg | ORAL_TABLET | Freq: Every day | ORAL | Status: DC
  Administered 2019-06-02 – 2019-06-03 (×2): 10 mg via ORAL
  Filled 2019-06-02: qty 1

## 2019-06-02 NOTE — Progress Notes (Addendum)
[facility] for Infectious Disease    Date of Admission:  05/15/2019   Total days of antibiotics 15 - RIPE          ID: JAZ MALLICK is a 73 y.o. male with pulmonary mTB Principal Problem:   Aspiration pneumonia ([place]) Active Problems:   Essential hypertension   CKD (chronic kidney disease) stage 3, GFR 30-59 ml/min (HCC)   Hyperlipidemia   Tobacco use disorder   Controlled type 2 diabetes mellitus with stage 3 chronic kidney disease, without long-term current use of insulin (HCC)   Weight loss   Hyperlipidemia associated with type 2 diabetes mellitus ([place])   Cavitary lesion of lung   Hypokalemia   Cavitary pneumonia   Anemia    Subjective: Afebrile, but did not getting out of bed. Having worsening rash, but not itching  Medications:  . buPROPion  150 mg Oral Daily  . clotrimazole   Topical BID  . enoxaparin (LOVENOX) injection  40 mg Subcutaneous Q24H  . feeding supplement (ENSURE ENLIVE)  237 mL Oral Q24H  . ferrous sulfate  325 mg Oral BID WC  . ipratropium  2 puff Inhalation TID  . mouth rinse  15 mL Mouth Rinse BID  . mometasone-formoterol  2 puff Inhalation BID  . multivitamin with minerals  1 tablet Oral Daily  . pantoprazole  40 mg Oral Q0600  . pravastatin  20 mg Oral q1800  . vitamin B-6  50 mg Oral Daily  . thiamine  100 mg Oral Daily    Objective: Vital signs in last 24 hours: Temp:  [98.1 F (36.7 C)-98.3 F (36.8 C)] 98.1 F (36.7 C) (08/28 0536) Pulse Rate:  [97-112] 104 (08/28 0536) Resp:  [16-20] 16 (08/28 0536) BP: (127-138)/(76-83) 127/83 (08/28 0536) SpO2:  [89 %-97 %] 97 % (08/28 0536) Weight:  [61.1 kg] 61.1 kg (08/28 0534)  Physical Exam  Constitutional: He is oriented to person, place, and time. He appears cachetic and under-nourished. No distress.  HENT:  Mouth/Throat: Oropharynx is clear and moist. No oropharyngeal exudate.  Cardiovascular: Normal rate, regular rhythm and normal heart sounds. Exam reveals no gallop and no  friction rub.  No murmur heard.  Pulmonary/Chest: Effort normal and breath sounds normal. No respiratory distress. He has no wheezes.  Abdominal: Soft. Bowel sounds are normal. He exhibits no distension. There is no tenderness.  Lymphadenopathy:  He has no cervical adenopathy.  Neurological: He is alert and oriented to person, place, and time.  Skin: Skin is warm and dry. No rash noted. No erythema.  Psychiatric: He has a normal mood and affect. His behavior is normal.    Lab Results Recent Labs    06/01/19 0611  WBC 8.6  HGB 8.9*  HCT 30.6*  NA 136  K 4.2  CL 96*  CO2 30  BUN 34*  CREATININE 1.55*   Liver Panel Recent Labs    05/31/19 0431  PROT 5.7*  ALBUMIN 1.9*  AST 20  ALT 12  ALKPHOS 80  BILITOT 0.7  BILIDIR 0.1  IBILI 0.6    Microbiology: mTB+ Studies/Results: No results found.  ASSESSMENT/PLAN:  Drug induced rash:    Addendum: will pretreat him with claritin to see if he tolerates RIPE. IF his rash continues to worsen on Monday then will decide if we need to temporarily hold his mTB regimen to see if it improves. Will discuss how to re-introduce regimen. Will check cbc differential to see if any eos  Pulmonary TB=  has had 2 wk of RIPE. May introduce back one drug at a time to see if can tolerate.  Deconditioning= continue to work with pt/ot  [facility] for Infectious Diseases Cell: [phone] Pager: [phone]  06/02/2019, 3:57 PM

## 2019-06-02 NOTE — TOC Progression Note (Addendum)
Transition of Care [facility]) - Progression Note    Patient Details  Name: POSEY PETRIK MRN: [id number] Date of Birth: 1946-03-13  [facility] CM/SW Contact  Purcell Mouton, RN Phone Number: 06/02/2019, 3:38 PM  Clinical Narrative:    Pt discharging home with [facility] and [facility] for Home O2 referrals given. Alvis Lemmings will start care on Monday 8/31. Pt and MD aware.    Expected Discharge Plan: [place] Barriers to Discharge: Continued Medical Work up  Expected Discharge Plan and Services Expected Discharge Plan: [place]   Discharge Planning Services: CM Consult   Living arrangements for the past 2 months: Mobile Home                                       Social Determinants of Health (SDOH) Interventions    Readmission Risk Interventions Readmission Risk Prevention Plan 05/23/2019  Transportation Screening Complete  PCP or Specialist Appt within 3-5 Days Not Complete  Not Complete comments Not ready for dc  HRI or [place] Complete  Social Work Consult for [place] Planning/Counseling Not Complete  SW consult not completed comments nA  Palliative Care Screening Not Applicable  Medication Review [employer] photographer) Complete  Some recent data might be hidden

## 2019-06-02 NOTE — Progress Notes (Signed)
Occupational Therapy Treatment Patient Details Name: Clarence Maldonado MRN: [id number] DOB: 1946/08/16 Today's Date: 06/02/2019    History of present illness 73 y.o. male with medical history significant of cardiomegaly, stroke, COPD,   CKD stage III, tobacco abuse, DM 2, HTN who presented to the emergency room on 8/10 with progressive weakness over the last 3 months to the point where he was falling over. He has lost approximately 43 pounds in the past 2 months. CT scan of chest noted multiple cavitary lesions of unclear etiology with a differential diagnosis of malignancy versus TB versus atypical infection.   OT comments  Performed ADL today.  Pt making good progress.  Rash present and itchy on back.  RN aware\   Follow Up Recommendations  SNF;Home health OT;Supervision/Assistance - 24 hour    Equipment Recommendations  3 in 1 bedside commode    Recommendations for Other Services      Precautions / Restrictions Precautions Precautions: Fall Precaution Comments: multiple falls Restrictions Weight Bearing Restrictions: No       Mobility Bed Mobility       Sidelying to sit: Min guard Supine to sit: Min guard     General bed mobility comments: lines  Transfers   Equipment used: Rolling walker (2 wheeled)   Sit to Stand: Min assist         General transfer comment: cues for UE placement; min A to stand    Balance             Standing balance-Leahy Scale: Poor                             ADL either performed or assessed with clinical judgement   ADL       Grooming: Set up;Sitting   Upper Body Bathing: Set up;Sitting   Lower Body Bathing: Minimal assistance   Upper Body Dressing : Minimal assistance   Lower Body Dressing: Minimal assistance;Sit to/from stand                 General ADL Comments: performed ADL from EOB.  Pt with rash, especially on back.  Did not want to sit up in chair     Vision       Perception      Praxis      Cognition Arousal/Alertness: Awake/alert Behavior During Therapy: WFL for tasks assessed/performed Overall Cognitive Status: Within Functional Limits for tasks assessed                                          Exercises     Shoulder Instructions       General Comments      Pertinent Vitals/ Pain       Pain Assessment: No/denies pain  Home Living                                          Prior Functioning/Environment              Frequency  Min 2X/week        Progress Toward Goals  OT Goals(current goals can now be found in the care plan section)  Progress towards OT goals: Progressing toward goals     Plan  Co-evaluation                 AM-PAC OT "6 Clicks" Daily Activity     Outcome Measure   Help from another person eating meals?: A Little Help from another person taking care of personal grooming?: A Little Help from another person toileting, which includes using toliet, bedpan, or urinal?: A Little Help from another person bathing (including washing, rinsing, drying)?: A Little Help from another person to put on and taking off regular upper body clothing?: A Little Help from another person to put on and taking off regular lower body clothing?: A Little 6 Click Score: 18    End of Session    OT Visit Diagnosis: Unsteadiness on feet (R26.81)   Activity Tolerance Patient tolerated treatment well   Patient Left in bed;with call bell/phone within reach;with bed alarm set   Nurse Communication          Time: 9326-7124 OT Time Calculation (min): 26 min  Charges: OT General Charges $OT Visit: 1 Visit OT Treatments $Self Care/Home Management : 23-37 mins  Lesle Chris, OTR/L Acute Rehabilitation Services [phone] [facility] pager [phone] office 06/02/2019   [employer] 06/02/2019, 12:49 PM

## 2019-06-02 NOTE — Progress Notes (Signed)
Pt's friend asked to speak to RN regarding Pt's discharge plan. Pt's friend voicing concerns about Pt's living situation. States "Clarence Maldonado lives in an [place] [place] behind his landlord and there is no bathroom that is working in there. He does have water and AC is working I am the only one who tries to help him but I am 73 years old"  RN informed Pt's friend that MD aware of the situation and RN will touch base with the MD. RN updated MD via phone regarding Pt's friends concerns. Maintain current plan of care for Pt.

## 2019-06-02 NOTE — Progress Notes (Signed)
  Speech Language Pathology Treatment: Dysphagia  Patient Details Name: Clarence Maldonado MRN: [id number] DOB: 1946-04-28 Today's Date: 06/02/2019 Time: 1030-1055 SLP Time Calculation (min) (ACUTE ONLY): 25 min  Assessment / Plan / Recommendation Clinical Impression  Pt continues with good intake and tolerance. RN reports pt tolerating medications well.  Pt reports he wants his coffee without thickener.  Per imaging MBS, he did not appear significant different with thin vs nectar.  Of note, his milk sent to him is HONEY thick - and he is on nectar. He reports he enjoys the milk however.   Given pt reports having to eat all food before drinking liquids even prior to admission and is demonstrating s/s of aspiration with thin water, continuing nectar thick liquids (but free water and coffee ok) seems reasonable at this time.  Will follow up for further dietary management to assure adequate intake with least aspiration risk.  Pt agreeable to plan.   Pt on phone talking to Jenny Reichmann about his dentures - that are still not located - therefore recommend to continue puree diet - pt intake continues to be good.   Note pt's phone is plugged in but not charging.  SLP tried it in several outlets and helped pt with problem solving to write number down on paper and on white board as he does not remember the phone numbers.  Pt can not read or write thus provided him with characters to identify specific people. He does read numbers however.      HPI HPI: 73 year old male admitted 05/15/2019 with progressive weakness and cough, failure to thrive. Pt lives alone. PMH: cardiomegaly, CVA (2009), COPD, HTN, DM, obesity, CKD III, tobacco abuse. Lung sounds: some scattered rhonchi. Course rhonchus cough.  CXR = bilateral infiltrates. Chest CT = bilateral diffuse nodular infiltrates with cavitation and a large right upper lobe cystic lesion      SLP Plan  Continue with current plan of care(RMST to strengthen pt's voice,  laryngeal elevation/epiglottic deflection and cough for airway protection)       Recommendations  Diet recommendations: Dysphagia 1 (puree);Thin liquid;Nectar-thick liquid Liquids provided via: Cup;Straw Medication Administration: Whole meds with puree(crush if large) Supervision: Patient able to self feed Compensations: Minimize environmental distractions;Slow rate;Small sips/bites(informed pt of need to keep cough/hock strong for airway protection - to which he stated "I don't have to do that" - decreased understanding to information provided but able to demonstrate x1 and teach back reasoning) Postural Changes and/or Swallow Maneuvers: Seated upright 90 degrees;Upright 30-60 min after meal                Oral Care Recommendations: Oral care BID Follow up Recommendations: None(tbd) SLP Visit Diagnosis: Dysphagia, unspecified (R13.10) Plan: Continue with current plan of care(RMST to strengthen pt's voice, laryngeal elevation/epiglottic deflection and cough for airway protection)       GO              Luanna Salk, MS [facility] SLP Acute Rehab Services Pager [phone] Office [phone]  Clarence Maldonado 06/02/2019, 11:53 AM

## 2019-06-02 NOTE — Progress Notes (Signed)
PROGRESS NOTE    Clarence LaosRobert M Schatz  WUJ:[id number] DOB: 1946/05/07 DOA: 05/15/2019 PCP: Kermit Baloeed, Tiffany L, DO    Brief Narrative:  73 year old male with history of stroke, COPD, CKD stage III, tobacco use, diabetes mellitus type 2, hypertension presented to the ED with progressive weakness for past 3 months with frequent falls.  He called EMS after a fall.  Reported poor p.o. intake and losing almost 43 pounds in the past 2 months.  No prior history of TB or exposure.  In the ED COVID-19 was tested negative.  CT of the chest showed multiple cavitary lesion suggestive of malignancy versus TB/atypical infection. Sputum for AFB and QuantiFERON gold positive.  MTB PCR also tested positive.  Hospital course complicated with patient developing acute hypoxic respiratory failure on 8/11 with ABG showing significant hypoxemia and hypercapnia (PO2 57 and PCO2 45).  Placed on empiric IV antibiotics.  Also found to have significant dysphagia.  Assessment & Plan:   Principal Problem:   Aspiration pneumonia (HCC) Active Problems:   Essential hypertension   CKD (chronic kidney disease) stage 3, GFR 30-59 ml/min (HCC)   Hyperlipidemia   Tobacco use disorder   Controlled type 2 diabetes mellitus with stage 3 chronic kidney disease, without long-term current use of insulin (HCC)   Weight loss   Hyperlipidemia associated with type 2 diabetes mellitus (HCC)   Cavitary lesion of lung   Hypokalemia   Cavitary pneumonia   Anemia   Principal Problem: Acute respiratory failure with hypoxia (HCC) Secondary to pulmonary TB (MTB PCR positive).  Also concerns with aspiration pneumonia.  ID consult appreciated and started on 4 drug therapy (rifampin, isoniazid by acetamide and ethambutol for 2 months to cover both latent and presumed active TB. continue B6 and thiamine.  Health department notified. Have repeat AFB sputum today (on day 14) to assess burden per ID recs  Currently on 2L nasal cannula.  Cont to  wean O2 as tolerated. Pt is O2 naive. Will likely require home o2  Continue airborne isolation. Completed antibiotics for aspiration pneumonia.  Concerns of difficulty with SNF placement given airborne isolation status. D/c planning still in progress. Discussed with CM, plan for possible d/c home on 8/30 with home health  Active Problems: Dysphagia Moderate oropharyngeal dysphagia with sensorimotor deficit noted on MBS also noted hypomotility of the pharynx.  High aspiration risk.  Continue dysphagia level 1 diet. Currently stable  Throat pain on swallowing Continue Chloraseptic spray with Magic mouthwash.  No thrush recently noted  Peripheral neuropathy Possibly associated with isoniazid/ ?  Rifampin.  Continue B6.  He does have iron deficiency anemia and started on supplement.  Reports some improvement.  TSH 2 months back was normal, repeat of 4.117.   labs reviewed. B12 of 483.  RPR non reactive  ?  Mass in left atrium on 2D echo Unable to rule out vegetation.  Comments on severe thickening of the mitral valve leaflet with severe calcification with severe and bulky posterior annular calcification.  Subcostal images suggest mobile mass in the right atrium.  Cardiology consulted and Dr. Johney Maineunghal had discussed with Dr. Delton SeeNelson who reviewed the echo and suggested the mass might be reflective of nonspecific congenital abnormality. Cardiology to follow up as outpatient  Nonsustained V. tach In the setting of persistent hypokalemia and hypomagnesemia.  replaced electrolytes.  Stable on telemetry and discontinued. Would repeat bmet in AM  Hypokalemia Resolved, labs reviewed.  Iron deficiency anemia Low iron panel, normal B12 (483). Hemoccult negative.  Added  iron supplement. Remains hemodynamically stable  Type 2 diabetes mellitus, controlled Secondary to severe weight loss and poor p.o. intake.  A1c of 6.3.  Monitor on sliding scale coverage only.  Adequate p.o. intake encouraged  Glucose trends remain stable   Weight loss with protein calorie malnutrition, Secondary to underlying TB and failure to thrive.  Nutrition and PT consult appreciated.  Would benefit from SNF, however disoposition issues given airborne precautions  Chronic kidney disease stage 3 Remains stable. Avoid nephrotoxins.   DVT prophylaxis: Lovenox subQ Code Status: Full Family Communication: Pt in room, family not at bedside Disposition Plan: Uncertain at this time  Consultants:   ID  Pulmonary  Procedures:     Antimicrobials: Anti-infectives (From admission, onward)   Start     Dose/Rate Route Frequency Ordered Stop   05/24/19 1000  pyrazinamide tablet 1,500 mg     1,500 mg Oral Daily 05/23/19 1626     05/24/19 1000  ethambutol (MYAMBUTOL) tablet 1,200 mg     1,200 mg Oral Daily 05/23/19 1626     05/19/19 1600  isoniazid (NYDRAZID) tablet 300 mg     300 mg Oral Daily 05/19/19 1428     05/19/19 1600  rifampin (RIFADIN) capsule 600 mg     600 mg Oral Daily 05/19/19 1428     05/19/19 1600  pyrazinamide tablet 1,000 mg  Status:  Discontinued     1,000 mg Oral Daily 05/19/19 1428 05/23/19 1626   05/19/19 1600  ethambutol (MYAMBUTOL) tablet 800 mg  Status:  Discontinued     800 mg Oral Daily 05/19/19 1428 05/23/19 1626   05/18/19 0000  vancomycin (VANCOCIN) IVPB 1000 mg/200 mL premix  Status:  Discontinued     1,000 mg 200 mL/hr over 60 Minutes Intravenous Every 36 hours 05/16/19 1219 05/17/19 1058   05/16/19 1130  vancomycin (VANCOCIN) 1,250 mg in sodium chloride 0.9 % 250 mL IVPB     1,250 mg 166.7 mL/hr over 90 Minutes Intravenous  Once 05/16/19 1100 05/16/19 1404   05/16/19 1000  ampicillin-sulbactam (UNASYN) 1.5 g in sodium chloride 0.9 % 100 mL IVPB     1.5 g 200 mL/hr over 30 Minutes Intravenous Every 6 hours 05/16/19 0952 05/23/19 2359      Subjective: Eager to go home soon  Objective: Vitals:   06/02/19 0028 06/02/19 0033 06/02/19 0534 06/02/19 0536  BP:     127/83  Pulse: (!) 103 97  (!) 104  Resp:    16  Temp:    98.1 F (36.7 C)  TempSrc:    Oral  SpO2: (!) 89% 96%  97%  Weight:   61.1 kg   Height:        Intake/Output Summary (Last 24 hours) at 06/02/2019 1435 Last data filed at 06/02/2019 1017 Gross per 24 hour  Intake 300 ml  Output 1150 ml  Net -850 ml   Filed Weights   05/31/19 0531 06/01/19 0500 06/02/19 0534  Weight: 63.1 kg 62.5 kg 61.1 kg    Examination: General exam: Conversant, in no acute distress Respiratory system: normal chest rise, clear, no audible wheezing   Data Reviewed: I have personally reviewed following labs and imaging studies  CBC: Recent Labs  Lab 05/30/19 1552 06/01/19 0611  WBC  --  8.6  HGB  --  8.9*  HCT 32.2* 30.6*  MCV  --  82.5  PLT  --  236   Basic Metabolic Panel: Recent Labs  Lab 05/28/19 0557 06/01/19 [id number]  NA 143 136  K 4.4 4.2  CL 103 96*  CO2 32 30  GLUCOSE 180* 108*  BUN 44* 34*  CREATININE 1.45* 1.55*  CALCIUM 9.2 8.6*   GFR: Estimated Creatinine Clearance: 37.2 mL/min (A) (by C-G formula based on SCr of 1.55 mg/dL (H)). Liver Function Tests: Recent Labs  Lab 05/31/19 0431  AST 20  ALT 12  ALKPHOS 80  BILITOT 0.7  PROT 5.7*  ALBUMIN 1.9*   No results for input(s): LIPASE, AMYLASE in the last 168 hours. No results for input(s): AMMONIA in the last 168 hours. Coagulation Profile: No results for input(s): INR, PROTIME in the last 168 hours. Cardiac Enzymes: No results for input(s): CKTOTAL, CKMB, CKMBINDEX, TROPONINI in the last 168 hours. BNP (last 3 results) No results for input(s): PROBNP in the last 8760 hours. HbA1C: No results for input(s): HGBA1C in the last 72 hours. CBG: Recent Labs  Lab 05/29/19 0831 05/30/19 0616 05/31/19 0523 06/01/19 0611 06/02/19 0539  GLUCAP 72 154* 76 100* 115*   Lipid Profile: No results for input(s): CHOL, HDL, LDLCALC, TRIG, CHOLHDL, LDLDIRECT in the last 72 hours. Thyroid Function Tests: Recent Labs     05/30/19 1552  TSH 4.117   Anemia Panel: No results for input(s): VITAMINB12, FOLATE, FERRITIN, TIBC, IRON, RETICCTPCT in the last 72 hours. Sepsis Labs: No results for input(s): PROCALCITON, LATICACIDVEN in the last 168 hours.  Recent Results (from the past 240 hour(s))  Culture, blood (routine x 2)     Status: None (Preliminary result)   Collection Time: 06/01/19  6:11 AM   Specimen: BLOOD  Result Value Ref Range Status   Specimen Description   Final    BLOOD RIGHT ANTECUBITAL Performed at [facility], [address], [place], [place] [address]    Special Requests   Final    BOTTLES DRAWN AEROBIC AND ANAEROBIC Blood Culture adequate volume Performed at [facility], [address] [place], [place] [address]    Culture   Final    NO GROWTH 1 DAY Performed at [facility], [address] [place], [place] [address]    Report Status PENDING  Incomplete  Culture, blood (routine x 2)     Status: None (Preliminary result)   Collection Time: 06/01/19  6:13 AM   Specimen: BLOOD RIGHT HAND  Result Value Ref Range Status   Specimen Description   Final    BLOOD RIGHT HAND Performed at [facility], [address] [place], [place] [address]    Special Requests   Final    BOTTLES DRAWN AEROBIC AND ANAEROBIC Blood Culture adequate volume Performed at [facility], [address], [place], [place] [address]    Culture   Final    NO GROWTH 1 DAY Performed at [facility], [address], [place], [place] [address]    Report Status PENDING  Incomplete     Radiology Studies: No results found.  Scheduled Meds: . buPROPion  150 mg Oral Daily  . clotrimazole   Topical BID  . enoxaparin (LOVENOX) injection  40 mg Subcutaneous Q24H  . ethambutol  1,200 mg Oral Daily  . feeding supplement (ENSURE ENLIVE)  237 mL Oral Q24H  . ferrous sulfate  325 mg Oral BID WC  . ipratropium  2 puff Inhalation  TID  . isoniazid  300 mg Oral Daily   And  . vitamin B-6  50 mg Oral Daily  . mouth rinse  15 mL Mouth Rinse BID  . mometasone-formoterol  2 puff Inhalation BID  . multivitamin with minerals  1 tablet Oral Daily  . pantoprazole  40 mg Oral Q0600  . pravastatin  20 mg Oral q1800  . pyrazinamide  1,500 mg Oral Daily  . rifampin  600 mg Oral Daily  . thiamine  100 mg Oral Daily   Continuous Infusions: . sodium chloride Stopped (05/23/19 0250)     LOS: 18 days   Marylu Lund, MD [facility] Pager On Amion  If 7PM-7AM, please contact night-coverage 06/02/2019, 2:35 PM

## 2019-06-02 NOTE — Progress Notes (Signed)
Physical Therapy Treatment Patient Details Name: Clarence Maldonado MRN: [id number] DOB: 03-Apr-1946 Today's Date: 06/02/2019    History of Present Illness 73 y.o. male with medical history significant of cardiomegaly, stroke, COPD,   CKD stage III, tobacco abuse, DM 2, HTN who presented to the emergency room on 8/10 with progressive weakness over the last 3 months to the point where he was falling over. He has lost approximately 43 pounds in the past 2 months. CT scan of chest noted multiple cavitary lesions of unclear etiology with a differential diagnosis of malignancy versus TB versus atypical infection.    PT Comments    Pt tries hard during his session, just very limited to activity toleance. Very little activy ( only 5 feet with RW) with needed rest break and dyspnea at 4/4 for 4-5 minute recovery. Very weak and activity tolernce very poor at this time. Worked in seated exercises with UE and LE and activity balloon toss again. Walked to window seal in room with rest 2 times. Pt still unsure of what he has and why , doesn't understand why he is weak and can't breathe. I have explained this each time I enter.     Follow Up Recommendations  SNF(however aware SNF may not be able to take him so will need HHPT once safe for DC home.)     Equipment Recommendations  Rolling walker with 5" wheels(may need WC and rollator when DC gets closer TBD)    Recommendations for Other Services       Precautions / Restrictions Precautions Precautions: Fall Precaution Comments: multiple falls Restrictions Weight Bearing Restrictions: No    Mobility  Bed Mobility Overal bed mobility: Needs Assistance Bed Mobility: Supine to Sit;Sit to Supine     Supine to sit: Min assist Sit to supine: Min assist      Transfers Overall transfer level: Needs assistance Equipment used: Rolling walker (2 wheeled) Transfers: Sit to/from Stand Sit to Stand: Min assist Stand pivot transfers: Min assist        General transfer comment: max cues for hand placment and problem solving for safety and inorder for him to assist himself up. At times he needs boost of Min A and Max cueing. Performed about 6 times during session today.  Ambulation/Gait Ambulation/Gait assistance: Min assist Gait Distance (Feet): 6 Feet(tolerated 2 times today with great rest breaks for dyspnea 4/4 resting on window seal also .) Assistive device: Rolling walker (2 wheeled) Gait Pattern/deviations: Step-through pattern     General Gait Details: slow steady , but required rest break about 3 -5 minutes in between due to dyspnea 4/4 with little walk in room   Stairs             Wheelchair Mobility    Modified Rankin (Stroke Patients Only)       Balance Overall balance assessment: Needs assistance         Standing balance support: Bilateral upper extremity supported;During functional activity Standing balance-Leahy Scale: Poor                              Cognition Arousal/Alertness: Awake/alert Behavior During Therapy: WFL for tasks assessed/performed Overall Cognitive Status: Within Functional Limits for tasks assessed Area of Impairment: Following commands;Problem solving                 Orientation Level: Disoriented to;Time     Following Commands: Follows one step commands consistently Safety/Judgement: Decreased awareness  of deficits   Problem Solving: Requires verbal cues        Exercises Other Exercises Other Exercises: EOB knee esxtension 15x BLE, holding 2 seconds in extension. Then UE reaching overhead 15x . All; for activity tolerance and trunk strengthening. Then baloon tap ( made of small trash bag) for 2 minutes before needed rest break to catch breath. Also stood at widow for 3 minute interval for tlerance as well. All required great breaks in between    General Comments        Pertinent Vitals/Pain Pain Assessment: No/denies pain    Home Living                       Prior Function            PT Goals (current goals can now be found in the care plan section) Acute Rehab PT Goals Patient Stated Goal: I want to get stronger so I can go home to my dog.  PT Goal Formulation: With patient Time For Goal Achievement: 06/19/19 Potential to Achieve Goals: Good Progress towards PT goals: Progressing toward goals    Frequency    Min 3X/week      PT Plan Current plan remains appropriate    Co-evaluation              AM-PAC PT "6 Clicks" Mobility   Outcome Measure  Help needed turning from your back to your side while in a flat bed without using bedrails?: A Little Help needed moving from lying on your back to sitting on the side of a flat bed without using bedrails?: A Little Help needed moving to and from a bed to a chair (including a wheelchair)?: A Little Help needed standing up from a chair using your arms (e.g., wheelchair or bedside chair)?: A Little Help needed to walk in hospital room?: A Lot Help needed climbing 3-5 steps with a railing? : A Lot 6 Click Score: 16    End of Session Equipment Utilized During Treatment: Gait belt Activity Tolerance: Patient tolerated treatment well Patient left: in chair Nurse Communication: Mobility status PT Visit Diagnosis: Unsteadiness on feet (R26.81);Muscle weakness (generalized) (M62.81);Difficulty in walking, not elsewhere classified (R26.2);Adult, failure to thrive (R62.7);History of falling (Z91.81);Repeated falls (R29.6)     Time: 1700-1800 PT Time Calculation (min) (ACUTE ONLY): 60 min  Charges:  $Gait Training: 8-22 mins $Therapeutic Exercise: 8-22 mins $Therapeutic Activity: 23-37 mins                     Clide Dales, PT Acute Rehabilitation Services Pager: [phone] Office: [phone] 06/02/2019    Clide Dales 06/02/2019, 6:09 PM

## 2019-06-02 NOTE — Progress Notes (Signed)
Pharmacy - Antimicrobial Stewardship  Called patient's room 8/25 and discussed TB regimen and monitoring for side effects.  Left information in patient education section on discharge AVS.  Understanding he has difficulty reading,  I sought and patient granted permission to speak with Jerlyn Ly.  I  spoke with Mr. [name] on 05/31/2019 via phone to explain TB medications and monitoring for side effects.  Mr. [name] asked if I could put this information in writing, this was done and placed in patient's chart for Mr. [name] to pick up 8/27 as he planned to visit hospital on that day.  RN aware of information to give when he arrives.  Information included Health Dept will do directly observed therapy but unsure of specifics.  I called health dept 8/27 to get more info, VM left, still awaiting return phone call.  Doreene Eland, PharmD, BCPS.   Work Cell: [phone] 06/02/2019 8:24 AM

## 2019-06-02 NOTE — Progress Notes (Signed)
  Speech Language Pathology Treatment: Dysphagia  Patient Details Name: Clarence Maldonado MRN: [id number] DOB: 30-Sep-1946 Today's Date: 06/02/2019 Time: 8115-7262 SLP Time Calculation (min) (ACUTE ONLY): 27 min  Assessment / Plan / Recommendation Clinical Impression  SLP 2nd visit to establish RMST initiated RMST (respiratory muscle strength training) with [employer] Expiratory Trainer.  Resistance was set clinically based on pt's performance.  Instructions reviewed and pt demonstrated exercise with moderate assist initially fading to min cues toward end of session.  He makes great effort and is motivated to improve.   He did admit to being "lightheaded" x1 after completion of 9 repetitions at level 9 - thus exercise was stopped and he was advised that this was NOT ok to sense.  Resistance modified to level 7 to assure pt tolerates and he was able to teach back need to wait at least 2 seconds between breaths.    SLP instructed pt to conduct  10 repetitions in pm and in pm.  As pt unable to read - SLP provided characters to help him recall his exercise and he was able to "teach back".  SLP will follow up Monday to assure tolerance and adaption of RMST as needed.  RMST has been proven to improve laryngeal elevation/submental musculature to improve pt's airway closure, swallowing, voice and cough.  Pt reports being excited to conduct exercises.      HPI HPI: 73 year old male admitted 05/15/2019 with progressive weakness and cough, failure to thrive. Pt lives alone. PMH: cardiomegaly, CVA (2009), COPD, HTN, DM, obesity, CKD III, tobacco abuse. Lung sounds: some scattered rhonchi. Course rhonchus cough.  CXR = bilateral infiltrates. Chest CT = bilateral diffuse nodular infiltrates with cavitation and a large right upper lobe cystic lesion      SLP Plan  Continue with current plan of care       Recommendations  Diet recommendations: Dysphagia 1 (puree);Thin liquid(pt is still receiving thicker  liquids and tolerating as well as consuming them , thus continuing thicker with meals (besides water) and allowing all liquids between meals advised) Liquids provided via: Cup;Straw Medication Administration: Whole meds with puree Supervision: Patient able to self feed Compensations: Minimize environmental distractions;Slow rate;Small sips/bites Postural Changes and/or Swallow Maneuvers: Seated upright 90 degrees;Upright 30-60 min after meal                Oral Care Recommendations: Oral care BID Follow up Recommendations: Home health SLP SLP Visit Diagnosis: Dysphagia, oropharyngeal phase (R13.12) Plan: Continue with current plan of care       GO                [name] 06/02/2019, 7:49 PM  Luanna Salk, [place] SLP [place] Pager [phone] Office [phone]

## 2019-06-03 LAB — GLUCOSE, CAPILLARY: Glucose-Capillary: 121 mg/dL — ABNORMAL HIGH (ref 70–99)

## 2019-06-03 NOTE — Progress Notes (Signed)
PROGRESS NOTE    Clarence Maldonado  MQK:[id number] DOB: Feb 10, 1946 DOA: 05/15/2019 PCP: [name], DO    Brief Narrative:  73 year old male with history of stroke, COPD, CKD stage III, tobacco use, diabetes mellitus type 2, hypertension presented to the ED with progressive weakness for past 3 months with frequent falls.  He called EMS after a fall.  Reported poor p.o. intake and losing almost 43 pounds in the past 2 months.  No prior history of TB or exposure.  In the ED COVID-19 was tested negative.  CT of the chest showed multiple cavitary lesion suggestive of malignancy versus TB/atypical infection. Sputum for AFB and QuantiFERON gold positive.  MTB PCR also tested positive.  Hospital course complicated with patient developing acute hypoxic respiratory failure on 8/11 with ABG showing significant hypoxemia and hypercapnia (PO2 57 and PCO2 45).  Placed on empiric IV antibiotics.  Also found to have significant dysphagia.  Assessment & Plan:   Principal Problem:   Aspiration pneumonia (HCC) Active Problems:   Essential hypertension   CKD (chronic kidney disease) stage 3, GFR 30-59 ml/min (HCC)   Hyperlipidemia   Tobacco use disorder   Controlled type 2 diabetes mellitus with stage 3 chronic kidney disease, without long-term current use of insulin (HCC)   Weight loss   Hyperlipidemia associated with type 2 diabetes mellitus (HCC)   Cavitary lesion of lung   Hypokalemia   Cavitary pneumonia   Anemia   Principal Problem: Acute respiratory failure with hypoxia (HCC) Secondary to pulmonary TB (MTB PCR positive).  Also concerns with aspiration pneumonia.  ID consult appreciated and started on 4 drug therapy (rifampin, isoniazid by acetamide and ethambutol for 2 months to cover both latent and presumed active TB. continue B6 and thiamine.  Health department notified. Repeat AFB sputum ordered, pending 8/28  Concerns of developing rash which may be related to RIPE tx. ID  recommendations to restart one at a time with claritin pre-med  Currently on 2L nasal cannula.  Cont to wean O2 as tolerated. Pt is O2 naive. Will likely require home o2  Continue airborne isolation. Completed antibiotics for aspiration pneumonia.  Active Problems: Dysphagia Moderate oropharyngeal dysphagia with sensorimotor deficit noted on MBS also noted hypomotility of the pharynx.  High aspiration risk.  Continue dysphagia level 1 diet. Presently stable  Throat pain on swallowing Continue Chloraseptic spray with Magic mouthwash.  No thrush noted recently  Peripheral neuropathy Possibly associated with isoniazid/ ?  Rifampin.  Continue B6.  He does have iron deficiency anemia and started on supplement.  Reports some improvement.  TSH 2 months back was normal, repeat of 4.117.   labs reviewed. B12 of 483.  RPR reviewed and was non reactive  ?  Mass in left atrium on 2D echo Unable to rule out vegetation.  Comments on severe thickening of the mitral valve leaflet with severe calcification with severe and bulky posterior annular calcification.  Subcostal images suggest mobile mass in the right atrium.  Cardiology consulted and Dr. Johney Maine had discussed with Dr. Delton See who reviewed the echo and suggested the mass might be reflective of nonspecific congenital abnormality. Recommend Cardiology to follow up as outpatient  Nonsustained V. tach In the setting of persistent hypokalemia and hypomagnesemia.  replaced electrolytes. -Remains stable at present  Hypokalemia Recent labs reviewed, resolved  Iron deficiency anemia Low iron panel, normal B12 (483). Hemoccult negative.  Added iron supplement. Remains hemodynamically stable at this  Type 2 diabetes mellitus, controlled Secondary to severe  weight loss and poor p.o. intake.  A1c of 6.3.  Monitor on sliding scale coverage only.  Adequate p.o. intake encouraged Glucose trends stable  Weight loss with protein calorie  malnutrition,  Would benefit from SNF, however disoposition issues given airborne precautionsunderlying TB and failure to thrive.  Nutrition and PT consult appreciated.    Chronic kidney disease stage 3 Currently stable. Avoid nephrotoxins.  DVT prophylaxis: Lovenox subQ Code Status: Full Family Communication: Pt in room, family not at bedside Disposition Plan: Uncertain at this time  Consultants:   ID  Pulmonary  Procedures:     Antimicrobials: Anti-infectives (From admission, onward)   Start     Dose/Rate Route Frequency Ordered Stop   06/03/19 1000  ethambutol (MYAMBUTOL) tablet 1,200 mg     1,200 mg Oral Daily 06/02/19 1735     06/03/19 1000  isoniazid (NYDRAZID) tablet 300 mg     300 mg Oral Daily 06/02/19 1735     06/03/19 1000  pyrazinamide tablet 1,500 mg     1,500 mg Oral Daily 06/02/19 1735     06/03/19 1000  rifampin (RIFADIN) capsule 600 mg     600 mg Oral Daily 06/02/19 1735     05/24/19 1000  pyrazinamide tablet 1,500 mg  Status:  Discontinued     1,500 mg Oral Daily 05/23/19 1626 06/02/19 1555   05/24/19 1000  ethambutol (MYAMBUTOL) tablet 1,200 mg  Status:  Discontinued     1,200 mg Oral Daily 05/23/19 1626 06/02/19 1555   05/19/19 1600  isoniazid (NYDRAZID) tablet 300 mg  Status:  Discontinued     300 mg Oral Daily 05/19/19 1428 06/02/19 1555   05/19/19 1600  rifampin (RIFADIN) capsule 600 mg  Status:  Discontinued     600 mg Oral Daily 05/19/19 1428 06/02/19 1555   05/19/19 1600  pyrazinamide tablet 1,000 mg  Status:  Discontinued     1,000 mg Oral Daily 05/19/19 1428 05/23/19 1626   05/19/19 1600  ethambutol (MYAMBUTOL) tablet 800 mg  Status:  Discontinued     800 mg Oral Daily 05/19/19 1428 05/23/19 1626   05/18/19 0000  vancomycin (VANCOCIN) IVPB 1000 mg/200 mL premix  Status:  Discontinued     1,000 mg 200 mL/hr over 60 Minutes Intravenous Every 36 hours 05/16/19 1219 05/17/19 1058   05/16/19 1130  vancomycin (VANCOCIN) 1,250 mg in sodium  chloride 0.9 % 250 mL IVPB     1,250 mg 166.7 mL/hr over 90 Minutes Intravenous  Once 05/16/19 1100 05/16/19 1404   05/16/19 1000  ampicillin-sulbactam (UNASYN) 1.5 g in sodium chloride 0.9 % 100 mL IVPB     1.5 g 200 mL/hr over 30 Minutes Intravenous Every 6 hours 05/16/19 0952 05/23/19 2359      Subjective: Remains eager to go home soon  Objective: Vitals:   06/02/19 2102 06/03/19 0100 06/03/19 0458 06/03/19 1249  BP: (!) 144/87  128/79 130/75  Pulse: 98  94 (!) 105  Resp: 18  16 16   Temp: 97.8 F (36.6 C) 98.2 F (36.8 C) 98.6 F (37 C) 98.1 F (36.7 C)  TempSrc: Oral Oral Oral Oral  SpO2: 98%  100% 99%  Weight:   62.3 kg   Height:        Intake/Output Summary (Last 24 hours) at 06/03/2019 1558 Last data filed at 06/03/2019 1556 Gross per 24 hour  Intake 540 ml  Output 1725 ml  Net -1185 ml   Filed Weights   06/01/19 0500 06/02/19 0534 06/03/19  0458  Weight: 62.5 kg 61.1 kg 62.3 kg    Examination: General exam: Awake, laying in bed, in nad Respiratory system: Normal respiratory effort, no wheezing Cardiovascular system: regular rate, s1, s2 Gastrointestinal system: Soft, nondistended, positive BS Central nervous system: CN2-12 grossly intact, strength intact Extremities: Perfused, no clubbing Skin: Normal skin turgor, no cyanosis Psychiatry: Mood normal // no visual hallucinations   Data Reviewed: I have personally reviewed following labs and imaging studies  CBC: Recent Labs  Lab 05/30/19 1552 06/01/19 0611 06/02/19 1631  WBC  --  8.6 5.2  NEUTROABS  --   --  3.7  HGB  --  8.9* 8.6*  HCT 32.2* 30.6* 29.4*  MCV  --  82.5 82.4  PLT  --  236 216   Basic Metabolic Panel: Recent Labs  Lab 05/28/19 0557 06/01/19 0611  NA 143 136  K 4.4 4.2  CL 103 96*  CO2 32 30  GLUCOSE 180* 108*  BUN 44* 34*  CREATININE 1.45* 1.55*  CALCIUM 9.2 8.6*   GFR: Estimated Creatinine Clearance: 38 mL/min (A) (by C-G formula based on SCr of 1.55 mg/dL (H)).  Liver Function Tests: Recent Labs  Lab 05/31/19 0431  AST 20  ALT 12  ALKPHOS 80  BILITOT 0.7  PROT 5.7*  ALBUMIN 1.9*   No results for input(s): LIPASE, AMYLASE in the last 168 hours. No results for input(s): AMMONIA in the last 168 hours. Coagulation Profile: No results for input(s): INR, PROTIME in the last 168 hours. Cardiac Enzymes: No results for input(s): CKTOTAL, CKMB, CKMBINDEX, TROPONINI in the last 168 hours. BNP (last 3 results) No results for input(s): PROBNP in the last 8760 hours. HbA1C: No results for input(s): HGBA1C in the last 72 hours. CBG: Recent Labs  Lab 05/30/19 0616 05/31/19 0523 06/01/19 0611 06/02/19 0539 06/03/19 0500  GLUCAP 154* 76 100* 115* 121*   Lipid Profile: No results for input(s): CHOL, HDL, LDLCALC, TRIG, CHOLHDL, LDLDIRECT in the last 72 hours. Thyroid Function Tests: No results for input(s): TSH, T4TOTAL, FREET4, T3FREE, THYROIDAB in the last 72 hours. Anemia Panel: No results for input(s): VITAMINB12, FOLATE, FERRITIN, TIBC, IRON, RETICCTPCT in the last 72 hours. Sepsis Labs: No results for input(s): PROCALCITON, LATICACIDVEN in the last 168 hours.  Recent Results (from the past 240 hour(s))  Culture, blood (routine x 2)     Status: None (Preliminary result)   Collection Time: 06/01/19  6:11 AM   Specimen: BLOOD  Result Value Ref Range Status   Specimen Description   Final    BLOOD RIGHT ANTECUBITAL Performed at [facility], [address] [place], [place] [address]    Special Requests   Final    BOTTLES DRAWN AEROBIC AND ANAEROBIC Blood Culture adequate volume Performed at [facility], [address] [place], [place] [address]    Culture   Final    NO GROWTH 2 DAYS Performed at [facility], [address] [place], [place] [address]    Report Status PENDING  Incomplete  Culture, blood (routine x 2)     Status: None (Preliminary result)   Collection Time: 06/01/19   6:13 AM   Specimen: BLOOD RIGHT HAND  Result Value Ref Range Status   Specimen Description   Final    BLOOD RIGHT HAND Performed at [facility], [address] [place], [place] [address]    Special Requests   Final    BOTTLES DRAWN AEROBIC AND ANAEROBIC Blood  Culture adequate volume Performed at [facility] [address] [place], [place] [address]    Culture   Final    NO GROWTH 2 DAYS Performed at [facility] [address] [place], [place] [address]    Report Status PENDING  Incomplete     Radiology Studies: No results found.  Scheduled Meds: . buPROPion  150 mg Oral Daily  . clotrimazole   Topical BID  . enoxaparin (LOVENOX) injection  40 mg Subcutaneous Q24H  . ethambutol  1,200 mg Oral Daily  . feeding supplement (ENSURE ENLIVE)  237 mL Oral Q24H  . ferrous sulfate  325 mg Oral BID WC  . ipratropium  2 puff Inhalation TID  . isoniazid  300 mg Oral Daily   And  . vitamin B-6  50 mg Oral Daily  . loratadine  10 mg Oral Q breakfast  . mouth rinse  15 mL Mouth Rinse BID  . mometasone-formoterol  2 puff Inhalation BID  . multivitamin with minerals  1 tablet Oral Daily  . pantoprazole  40 mg Oral Q0600  . pravastatin  20 mg Oral q1800  . pyrazinamide  1,500 mg Oral Daily  . rifampin  600 mg Oral Daily  . thiamine  100 mg Oral Daily   Continuous Infusions: . sodium chloride Stopped (05/23/19 0250)     LOS: 19 days   Marylu Lund, MD [facility] Pager On Amion  If 7PM-7AM, please contact night-coverage 06/03/2019, 3:58 PM

## 2019-06-03 NOTE — Progress Notes (Signed)
Ambulated pt in room for home O2 qualification. Pt started on room air with O2 sat 95% at rest. Assisted him halfway around bed off O2 and he became SOB and very weak needing to "sit down". Oxygen saturation dropped to 69%. Pt returned to bed and resumed O2 and oxygen saturation recovered to 97% on 3l/min. Was unable to complete ambulation on O2 due to pt's intolerance. Eulas Post, RN

## 2019-06-04 LAB — GLUCOSE, CAPILLARY: Glucose-Capillary: 80 mg/dL (ref 70–99)

## 2019-06-04 NOTE — Plan of Care (Signed)
Patient's appetite has increased during hospitalization.

## 2019-06-04 NOTE — Progress Notes (Signed)
PROGRESS NOTE    Clarence Maldonado  WNU:[id number] DOB: 10-Jan-1946 DOA: 05/15/2019 PCP: Gayland Curry, DO    Brief Narrative:  73 year old male with history of stroke, COPD, CKD stage III, tobacco use, diabetes mellitus type 2, hypertension presented to the ED with progressive weakness for past 3 months with frequent falls.  He called EMS after a fall.  Reported poor p.o. intake and losing almost 43 pounds in the past 2 months.  No prior history of TB or exposure.  In the ED COVID-19 was tested negative.  CT of the chest showed multiple cavitary lesion suggestive of malignancy versus TB/atypical infection. Sputum for AFB and QuantiFERON gold positive.  MTB PCR also tested positive.  Hospital course complicated with patient developing acute hypoxic respiratory failure on 8/11 with ABG showing significant hypoxemia and hypercapnia (PO2 57 and PCO2 45).  Placed on empiric IV antibiotics.  Also found to have significant dysphagia.  Assessment & Plan:   Principal Problem:   Aspiration pneumonia ([place]) Active Problems:   Essential hypertension   CKD (chronic kidney disease) stage 3, GFR 30-59 ml/min (HCC)   Hyperlipidemia   Tobacco use disorder   Controlled type 2 diabetes mellitus with stage 3 chronic kidney disease, without long-term current use of insulin (HCC)   Weight loss   Hyperlipidemia associated with type 2 diabetes mellitus ([place])   Cavitary lesion of lung   Hypokalemia   Cavitary pneumonia   Anemia   Principal Problem: Acute respiratory failure with hypoxia (HCC) Secondary to pulmonary TB (MTB PCR positive).  Also concerns with aspiration pneumonia.  ID consult appreciated and started on 4 drug therapy (rifampin, isoniazid by acetamide and ethambutol for 2 months to cover both latent and presumed active TB. continue B6 and thiamine.  Health department notified. Repeat AFB sputum ordered on 8/28, still pending adequate sputum sample  Concerns of developing rash which may be  related to RIPE tx. ID recommendations to restart one at a time with claritin pre-med  Presently weaned to room air  Continue airborne isolation. Completed antibiotics for aspiration pneumonia.  Very unsafe discharge home given extremely poor living conditions and debility.   Active Problems: Dysphagia Moderate oropharyngeal dysphagia with sensorimotor deficit noted on MBS also noted hypomotility of the pharynx.  High aspiration risk.  Continue dysphagia level 1 diet. Presently stable  Throat pain on swallowing Continue Chloraseptic spray with Magic mouthwash.  No thrush noted recently  Peripheral neuropathy Possibly associated with isoniazid/ ?  Rifampin.  Continue B6.  He does have iron deficiency anemia and started on supplement.  Reports some improvement.  TSH 2 months back was normal, repeat of 4.117.   labs reviewed. B12 of 483.  RPR reviewed and was non reactive  ?  Mass in left atrium on 2D echo Unable to rule out vegetation.  Comments on severe thickening of the mitral valve leaflet with severe calcification with severe and bulky posterior annular calcification.  Subcostal images suggest mobile mass in the right atrium.  Cardiology consulted and Dr. Rozanna Box had discussed with Dr. Meda Coffee who reviewed the echo and suggested the mass might be reflective of nonspecific congenital abnormality. Recommend Cardiology to follow up as outpatient  Nonsustained V. tach In the setting of persistent hypokalemia and hypomagnesemia.  replaced electrolytes. -Remains stable at present  Hypokalemia Recent labs reviewed, resolved  Iron deficiency anemia Low iron panel, normal B12 (483). Hemoccult negative.  Added iron supplement. Remains hemodynamically stable at this  Type 2 diabetes mellitus, controlled Secondary  to severe weight loss and poor p.o. intake.  A1c of 6.3.  Monitor on sliding scale coverage only.  Adequate p.o. intake encouraged Stable  Weight loss with protein  calorie malnutrition,  Would benefit from SNF, however disoposition issues given airborne precautionsunderlying TB and failure to thrive.  Nutrition and PT consult appreciated.    Chronic kidney disease stage 3 Currently stable. Avoid nephrotoxins.  DVT prophylaxis: Lovenox subQ Code Status: Full Family Communication: Pt in room, family not at bedside Disposition Plan: Uncertain at this time  Consultants:   ID  Pulmonary  Procedures:     Antimicrobials: Anti-infectives (From admission, onward)   Start     Dose/Rate Route Frequency Ordered Stop   06/03/19 1000  ethambutol (MYAMBUTOL) tablet 1,200 mg     1,200 mg Oral Daily 06/02/19 1735     06/03/19 1000  isoniazid (NYDRAZID) tablet 300 mg     300 mg Oral Daily 06/02/19 1735     06/03/19 1000  pyrazinamide tablet 1,500 mg     1,500 mg Oral Daily 06/02/19 1735     06/03/19 1000  rifampin (RIFADIN) capsule 600 mg     600 mg Oral Daily 06/02/19 1735     05/24/19 1000  pyrazinamide tablet 1,500 mg  Status:  Discontinued     1,500 mg Oral Daily 05/23/19 1626 06/02/19 1555   05/24/19 1000  ethambutol (MYAMBUTOL) tablet 1,200 mg  Status:  Discontinued     1,200 mg Oral Daily 05/23/19 1626 06/02/19 1555   05/19/19 1600  isoniazid (NYDRAZID) tablet 300 mg  Status:  Discontinued     300 mg Oral Daily 05/19/19 1428 06/02/19 1555   05/19/19 1600  rifampin (RIFADIN) capsule 600 mg  Status:  Discontinued     600 mg Oral Daily 05/19/19 1428 06/02/19 1555   05/19/19 1600  pyrazinamide tablet 1,000 mg  Status:  Discontinued     1,000 mg Oral Daily 05/19/19 1428 05/23/19 1626   05/19/19 1600  ethambutol (MYAMBUTOL) tablet 800 mg  Status:  Discontinued     800 mg Oral Daily 05/19/19 1428 05/23/19 1626   05/18/19 0000  vancomycin (VANCOCIN) IVPB 1000 mg/200 mL premix  Status:  Discontinued     1,000 mg 200 mL/hr over 60 Minutes Intravenous Every 36 hours 05/16/19 1219 05/17/19 1058   05/16/19 1130  vancomycin (VANCOCIN) 1,250 mg in  sodium chloride 0.9 % 250 mL IVPB     1,250 mg 166.7 mL/hr over 90 Minutes Intravenous  Once 05/16/19 1100 05/16/19 1404   05/16/19 1000  ampicillin-sulbactam (UNASYN) 1.5 g in sodium chloride 0.9 % 100 mL IVPB     1.5 g 200 mL/hr over 30 Minutes Intravenous Every 6 hours 05/16/19 0952 05/23/19 2359      Subjective: Wanting to go home soon, however understands need to stay in hospital  Objective: Vitals:   06/03/19 2018 06/04/19 0500 06/04/19 0540 06/04/19 1253  BP: 133/78  134/78 (!) 144/84  Pulse: (!) 110  92 (!) 102  Resp: 18  20 18   Temp: 98 F (36.7 C)  98.7 F (37.1 C) 97.8 F (36.6 C)  TempSrc: Oral  Oral Oral  SpO2: 98%  100% 93%  Weight:  61.3 kg    Height:        Intake/Output Summary (Last 24 hours) at 06/04/2019 1305 Last data filed at 06/04/2019 0500 Gross per 24 hour  Intake 480 ml  Output 850 ml  Net -370 ml   Filed Weights   06/02/19  [id number] 06/03/19 0458 06/04/19 0500  Weight: 61.1 kg 62.3 kg 61.3 kg    Examination: General exam: Conversant, in no acute distress Respiratory system: normal chest rise, clear, no audible wheezing  Data Reviewed: I have personally reviewed following labs and imaging studies  CBC: Recent Labs  Lab 05/30/19 1552 06/01/19 0611 06/02/19 1631  WBC  --  8.6 5.2  NEUTROABS  --   --  3.7  HGB  --  8.9* 8.6*  HCT 32.2* 30.6* 29.4*  MCV  --  82.5 82.4  PLT  --  236 216   Basic Metabolic Panel: Recent Labs  Lab 06/01/19 0611  NA 136  K 4.2  CL 96*  CO2 30  GLUCOSE 108*  BUN 34*  CREATININE 1.55*  CALCIUM 8.6*   GFR: Estimated Creatinine Clearance: 37.4 mL/min (A) (by C-G formula based on SCr of 1.55 mg/dL (H)). Liver Function Tests: Recent Labs  Lab 05/31/19 0431  AST 20  ALT 12  ALKPHOS 80  BILITOT 0.7  PROT 5.7*  ALBUMIN 1.9*   No results for input(s): LIPASE, AMYLASE in the last 168 hours. No results for input(s): AMMONIA in the last 168 hours. Coagulation Profile: No results for input(s): INR,  PROTIME in the last 168 hours. Cardiac Enzymes: No results for input(s): CKTOTAL, CKMB, CKMBINDEX, TROPONINI in the last 168 hours. BNP (last 3 results) No results for input(s): PROBNP in the last 8760 hours. HbA1C: No results for input(s): HGBA1C in the last 72 hours. CBG: Recent Labs  Lab 05/31/19 0523 06/01/19 0611 06/02/19 0539 06/03/19 0500 06/04/19 0537  GLUCAP 76 100* 115* 121* 80   Lipid Profile: No results for input(s): CHOL, HDL, LDLCALC, TRIG, CHOLHDL, LDLDIRECT in the last 72 hours. Thyroid Function Tests: No results for input(s): TSH, T4TOTAL, FREET4, T3FREE, THYROIDAB in the last 72 hours. Anemia Panel: No results for input(s): VITAMINB12, FOLATE, FERRITIN, TIBC, IRON, RETICCTPCT in the last 72 hours. Sepsis Labs: No results for input(s): PROCALCITON, LATICACIDVEN in the last 168 hours.  Recent Results (from the past 240 hour(s))  Culture, blood (routine x 2)     Status: None (Preliminary result)   Collection Time: 06/01/19  6:11 AM   Specimen: BLOOD  Result Value Ref Range Status   Specimen Description   Final    BLOOD RIGHT ANTECUBITAL Performed at [facility], [address] [place], [place] [address]    Special Requests   Final    BOTTLES DRAWN AEROBIC AND ANAEROBIC Blood Culture adequate volume Performed at [facility], [address] [place], [place] [address]    Culture   Final    NO GROWTH 3 DAYS Performed at [facility], [address] [place], [place] [address]    Report Status PENDING  Incomplete  Culture, blood (routine x 2)     Status: None (Preliminary result)   Collection Time: 06/01/19  6:13 AM   Specimen: BLOOD RIGHT HAND  Result Value Ref Range Status   Specimen Description   Final    BLOOD RIGHT HAND Performed at [facility], [address] [place], [place] [address]    Special Requests   Final    BOTTLES DRAWN AEROBIC AND ANAEROBIC Blood Culture adequate volume  Performed at [facility], [address] [place], [place] [address]    Culture   Final    NO GROWTH 3 DAYS Performed at [facility], [address] [place], [place] [address]  Report Status PENDING  Incomplete     Radiology Studies: No results found.  Scheduled Meds: . buPROPion  150 mg Oral Daily  . clotrimazole   Topical BID  . enoxaparin (LOVENOX) injection  40 mg Subcutaneous Q24H  . ethambutol  1,200 mg Oral Daily  . feeding supplement (ENSURE ENLIVE)  237 mL Oral Q24H  . ferrous sulfate  325 mg Oral BID WC  . ipratropium  2 puff Inhalation TID  . isoniazid  300 mg Oral Daily   And  . vitamin B-6  50 mg Oral Daily  . loratadine  10 mg Oral Q breakfast  . mouth rinse  15 mL Mouth Rinse BID  . mometasone-formoterol  2 puff Inhalation BID  . multivitamin with minerals  1 tablet Oral Daily  . pantoprazole  40 mg Oral Q0600  . pravastatin  20 mg Oral q1800  . pyrazinamide  1,500 mg Oral Daily  . rifampin  600 mg Oral Daily  . thiamine  100 mg Oral Daily   Continuous Infusions: . sodium chloride Stopped (05/23/19 0250)     LOS: 20 days   Rickey Barbara, MD [facility] Pager On Amion  If 7PM-7AM, please contact night-coverage 06/04/2019, 1:05 PM

## 2019-06-05 LAB — GLUCOSE, CAPILLARY: Glucose-Capillary: 110 mg/dL — ABNORMAL HIGH (ref 70–99)

## 2019-06-05 NOTE — Progress Notes (Signed)
Physical Therapy Treatment Patient Details Name: Clarence Maldonado MRN: [id number] DOB: 11-14-1945 Today's Date: 06/05/2019    History of Present Illness 73 y.o. male with medical history significant of cardiomegaly, stroke, COPD,   CKD stage III, tobacco abuse, DM 2, HTN who presented to the emergency room on 8/10 with progressive weakness over the last 3 months to the point where he was falling over. He has lost approximately 43 pounds in the past 2 months. CT scan of chest noted multiple cavitary lesions of unclear etiology with a differential diagnosis of malignancy versus TB versus atypical infection.    PT Comments    Despite max encouragement from therapist, pt would not agree to OOB activity. He wound not even stand at the bedside. Sat EOB for some time. O2 on RA during session was 92%.    Follow Up Recommendations  SNF(possibly home due to TB diagnosis)     Equipment Recommendations  Rolling walker with 5" wheels(possibly wheelchair)    Recommendations for Other Services       Precautions / Restrictions Precautions Precautions: Fall Precaution Comments: multiple falls Restrictions Weight Bearing Restrictions: No    Mobility  Bed Mobility Overal bed mobility: Needs Assistance Bed Mobility: Supine to Sit;Sit to Supine   Sidelying to sit: Min guard;HOB elevated Supine to sit: Min guard;HOB elevated     General bed mobility comments: for safety  Transfers                 General transfer comment: NT-pt refused to stand despite maax encouragement  Ambulation/Gait                 Stairs             Wheelchair Mobility    Modified Rankin (Stroke Patients Only)       Balance                                            Cognition Arousal/Alertness: Awake/alert Behavior During Therapy: WFL for tasks assessed/performed Overall Cognitive Status: Within Functional Limits for tasks assessed                                        Exercises      General Comments        Pertinent Vitals/Pain Pain Assessment: Faces Faces Pain Scale: No hurt    Home Living                      Prior Function            PT Goals (current goals can now be found in the care plan section) Progress towards PT goals: Not progressing toward goals - comment(pt refused OOB this session)    Frequency    Min 3X/week      PT Plan Current plan remains appropriate    Co-evaluation              AM-PAC PT "6 Clicks" Mobility   Outcome Measure  Help needed turning from your back to your side while in a flat bed without using bedrails?: A Little Help needed moving from lying on your back to sitting on the side of a flat bed without using bedrails?: A Little Help needed moving to and from  a bed to a chair (including a wheelchair)?: A Little Help needed standing up from a chair using your arms (e.g., wheelchair or bedside chair)?: A Little Help needed to walk in hospital room?: A Lot Help needed climbing 3-5 steps with a railing? : Total 6 Click Score: 15    End of Session Equipment Utilized During Treatment: Oxygen Activity Tolerance: Patient tolerated treatment well Patient left: in bed;with call bell/phone within reach;with bed alarm set   PT Visit Diagnosis: Unsteadiness on feet (R26.81);Muscle weakness (generalized) (M62.81);Difficulty in walking, not elsewhere classified (R26.2);Adult, failure to thrive (R62.7);History of falling (Z91.81);Repeated falls (R29.6)     Time: 3710-6269 PT Time Calculation (min) (ACUTE ONLY): 23 min  Charges:  $Therapeutic Activity: 8-22 mins                       Weston Anna, PT Acute Rehabilitation Services Pager: [phone] Office: [phone]

## 2019-06-05 NOTE — Progress Notes (Signed)
Occupational Therapy Treatment Patient Details Name: Clarence Maldonado MRN: [id number] DOB: [birth year] Today's Date: 06/05/2019    History of present illness 73 y.o. male with medical history significant of cardiomegaly, stroke, COPD,   CKD stage III, tobacco abuse, DM 2, HTN who presented to the emergency room on 8/10 with progressive weakness over the last 3 months to the point where he was falling over. He has lost approximately 43 pounds in the past 2 months. CT scan of chest noted multiple cavitary lesions of unclear etiology with a differential diagnosis of malignancy versus TB versus atypical infection.   OT comments  Pt progressing towards OT goals this session, on 3L O2 throughout session. DOE 3/4 with transfers, able to complete exercises supine (BLE) and and BUE (in chair) Pt educated and provided theraband and HEP (written) as seen below. Pt remains very motivated, current POC remains appropriate.    Follow Up Recommendations  SNF;Home health OT;Supervision/Assistance - 24 hour    Equipment Recommendations  3 in 1 bedside commode    Recommendations for Other Services      Precautions / Restrictions Precautions Precautions: Fall Precaution Comments: multiple falls, illiterate Restrictions Weight Bearing Restrictions: No       Mobility Bed Mobility Overal bed mobility: Needs Assistance Bed Mobility: Supine to Sit;Sit to Supine     Supine to sit: Min assist Sit to supine: Min assist   General bed mobility comments: lines  Transfers Overall transfer level: Needs assistance Equipment used: Rolling walker (2 wheeled) Transfers: Sit to/from [facility] Sit to Stand: Min assist Stand pivot transfers: Min assist       General transfer comment: max cues for hand placment and problem solving for safety and inorder for him to assist himself up. At times he needs boost of Min A and Max cueing.    Balance Overall balance assessment: Needs  assistance Sitting-balance support: Feet supported Sitting balance-Leahy Scale: Good     Standing balance support: Bilateral upper extremity supported;During functional activity Standing balance-Leahy Scale: Poor                             ADL either performed or assessed with clinical judgement   ADL Overall ADL's : Needs assistance/impaired                         Toilet Transfer: Minimal assistance;Stand-pivot;RW   Toileting- Clothing Manipulation and Hygiene: Maximal assistance;Sit to/from stand Toileting - Clothing Manipulation Details (indicate cue type and reason): Pt able to maintain upright, OT assisted with peri care     Functional mobility during ADLs: Min guard;Minimal assistance;Rolling walker       Vision       Perception     Praxis      Cognition                                                Exercises Exercises: General Upper Extremity;General Lower Extremity General Exercises - Upper Extremity Shoulder Flexion: AROM;Both;5 reps;Theraband Theraband Level (Shoulder Flexion): Level 1 (Yellow) Shoulder Extension: AROM;Both;5 reps;Theraband Theraband Level (Shoulder Extension): Level 1 (Yellow) Shoulder Horizontal ABduction: AROM;Both;5 reps;Theraband Theraband Level (Shoulder Horizontal Abduction): Level 1 (Yellow) Shoulder Horizontal ADduction: AROM;Both;5 reps;Theraband Theraband Level (Shoulder Horizontal Adduction): Level 1 (Yellow) General Exercises - Lower Extremity  Long Arc Quad: AROM;Both;Seated Hip ABduction/ADduction: AROM;Both;Supine Straight Leg Raises: AROM;Both;Supine Hip Flexion/Marching: AROM;Both;Seated   Shoulder Instructions       General Comments      Pertinent Vitals/ Pain          Home Living                                          Prior Functioning/Environment              Frequency  Min 3X/week        Progress Toward Goals  OT Goals(current  goals can now be found in the care plan section)  Progress towards OT goals: Progressing toward goals  Acute Rehab OT Goals Patient Stated Goal: I want to get stronger so I can go home to my dog.  OT Goal Formulation: With patient Time For Goal Achievement: 06/19/19 Potential to Achieve Goals: Good  Plan Discharge plan remains appropriate;Frequency needs to be updated    Co-evaluation                 AM-PAC OT "6 Clicks" Daily Activity     Outcome Measure   Help from another person eating meals?: None Help from another person taking care of personal grooming?: A Little Help from another person toileting, which includes using toliet, bedpan, or urinal?: A Little Help from another person bathing (including washing, rinsing, drying)?: A Little Help from another person to put on and taking off regular upper body clothing?: A Little Help from another person to put on and taking off regular lower body clothing?: A Little 6 Click Score: 19    End of Session Equipment Utilized During Treatment: Rolling walker;Oxygen(2L via [place])  OT Visit Diagnosis: Unsteadiness on feet (R26.81)   Activity Tolerance Patient tolerated treatment well   Patient Left in bed;with call bell/phone within reach;with bed alarm set   Nurse Communication Mobility status        Time: 1223-1300 OT Time Calculation (min): 37 min  Charges: OT General Charges $OT Visit: 1 Visit OT Treatments $Therapeutic Activity: 8-22 mins $Therapeutic Exercise: 8-22 mins  Sherryl Manges OTR/L Acute Rehabilitation Services Pager: [phone] Office: [phone]  Evern Bio Zelphia Glover 06/05/2019, 1:18 PM

## 2019-06-05 NOTE — Progress Notes (Signed)
PROGRESS NOTE    Clarence LaosRobert M Brooking  ZOX:[id number] DOB: 1945/10/23 DOA: 05/15/2019 PCP: Kermit Baloeed, Tiffany L, DO    Brief Narrative:  73 year old male with history of stroke, COPD, CKD stage III, tobacco use, diabetes mellitus type 2, hypertension presented to the ED with progressive weakness for past 3 months with frequent falls.  He called EMS after a fall.  Reported poor p.o. intake and losing almost 43 pounds in the past 2 months.  No prior history of TB or exposure.  In the ED COVID-19 was tested negative.  CT of the chest showed multiple cavitary lesion suggestive of malignancy versus TB/atypical infection. Sputum for AFB and QuantiFERON gold positive.  MTB PCR also tested positive.  Hospital course complicated with patient developing acute hypoxic respiratory failure on 8/11 with ABG showing significant hypoxemia and hypercapnia (PO2 57 and PCO2 45).  Placed on empiric IV antibiotics.  Also found to have significant dysphagia.  Assessment & Plan:   Principal Problem:   Aspiration pneumonia (HCC) Active Problems:   Essential hypertension   CKD (chronic kidney disease) stage 3, GFR 30-59 ml/min (HCC)   Hyperlipidemia   Tobacco use disorder   Controlled type 2 diabetes mellitus with stage 3 chronic kidney disease, without long-term current use of insulin (HCC)   Weight loss   Hyperlipidemia associated with type 2 diabetes mellitus (HCC)   Cavitary lesion of lung   Hypokalemia   Cavitary pneumonia   Anemia   Principal Problem: Acute respiratory failure with hypoxia (HCC) Secondary to pulmonary TB (MTB PCR positive).  Also concerns with aspiration pneumonia.  ID consult appreciated and started on 4 drug therapy (rifampin, isoniazid by acetamide and ethambutol for 2 months to cover both latent and presumed active TB. continue B6 and thiamine.  Health department notified. Repeat AFB sputum ordered on 8/28, still pending adequate sputum sample  Concerns of developing rash which may be  related to RIPE tx. ID recommendations to restart one at a time with claritin pre-med  Presently weaned to room air  Continue airborne isolation. Completed antibiotics for aspiration pneumonia.  Very unsafe discharge home given extremely poor living conditions and debility. Pending repeat sputum for repeat AFB   Active Problems: Dysphagia Moderate oropharyngeal dysphagia with sensorimotor deficit noted on MBS also noted hypomotility of the pharynx.  High aspiration risk.  Continue dysphagia level 1 diet. Presently stable  Throat pain on swallowing Continue Chloraseptic spray with Magic mouthwash.  No thrush noted recently  Peripheral neuropathy Possibly associated with isoniazid/ ?  Rifampin.  Continue B6.  He does have iron deficiency anemia and started on supplement.  Reports some improvement.  TSH 2 months back was normal, repeat of 4.117.   labs reviewed. B12 of 483.  RPR reviewed and was non reactive  ?  Mass in left atrium on 2D echo Unable to rule out vegetation.  Comments on severe thickening of the mitral valve leaflet with severe calcification with severe and bulky posterior annular calcification.  Subcostal images suggest mobile mass in the right atrium.  Cardiology consulted and Dr. Johney Maineunghal had discussed with Dr. Delton SeeNelson who reviewed the echo and suggested the mass might be reflective of nonspecific congenital abnormality. Recommend Cardiology to follow up as outpatient  Nonsustained V. tach In the setting of persistent hypokalemia and hypomagnesemia.  replaced electrolytes. -Remains stable at present  Hypokalemia Recent labs reviewed, resolved  Iron deficiency anemia Low iron panel, normal B12 (483). Hemoccult negative.  Added iron supplement. Remains hemodynamically stable at this  Type 2 diabetes mellitus, controlled Secondary to severe weight loss and poor p.o. intake.  A1c of 6.3.  Monitor on sliding scale coverage only.  Adequate p.o. intake encouraged  Stable  Weight loss with protein calorie malnutrition,  Would benefit from SNF, however disoposition issues given airborne precautionsunderlying TB and failure to thrive.  Nutrition and PT consult appreciated.    Chronic kidney disease stage 3 Currently stable. Avoid nephrotoxins.  DVT prophylaxis: Lovenox subQ Code Status: Full Family Communication: Pt in room, family not at bedside Disposition Plan: Uncertain at this time  Consultants:   ID  Pulmonary  Procedures:     Antimicrobials: Anti-infectives (From admission, onward)   Start     Dose/Rate Route Frequency Ordered Stop   06/03/19 1000  ethambutol (MYAMBUTOL) tablet 1,200 mg     1,200 mg Oral Daily 06/02/19 1735     06/03/19 1000  isoniazid (NYDRAZID) tablet 300 mg     300 mg Oral Daily 06/02/19 1735     06/03/19 1000  pyrazinamide tablet 1,500 mg     1,500 mg Oral Daily 06/02/19 1735     06/03/19 1000  rifampin (RIFADIN) capsule 600 mg     600 mg Oral Daily 06/02/19 1735     05/24/19 1000  pyrazinamide tablet 1,500 mg  Status:  Discontinued     1,500 mg Oral Daily 05/23/19 1626 06/02/19 1555   05/24/19 1000  ethambutol (MYAMBUTOL) tablet 1,200 mg  Status:  Discontinued     1,200 mg Oral Daily 05/23/19 1626 06/02/19 1555   05/19/19 1600  isoniazid (NYDRAZID) tablet 300 mg  Status:  Discontinued     300 mg Oral Daily 05/19/19 1428 06/02/19 1555   05/19/19 1600  rifampin (RIFADIN) capsule 600 mg  Status:  Discontinued     600 mg Oral Daily 05/19/19 1428 06/02/19 1555   05/19/19 1600  pyrazinamide tablet 1,000 mg  Status:  Discontinued     1,000 mg Oral Daily 05/19/19 1428 05/23/19 1626   05/19/19 1600  ethambutol (MYAMBUTOL) tablet 800 mg  Status:  Discontinued     800 mg Oral Daily 05/19/19 1428 05/23/19 1626   05/18/19 0000  vancomycin (VANCOCIN) IVPB 1000 mg/200 mL premix  Status:  Discontinued     1,000 mg 200 mL/hr over 60 Minutes Intravenous Every 36 hours 05/16/19 1219 05/17/19 1058   05/16/19 1130   vancomycin (VANCOCIN) 1,250 mg in sodium chloride 0.9 % 250 mL IVPB     1,250 mg 166.7 mL/hr over 90 Minutes Intravenous  Once 05/16/19 1100 05/16/19 1404   05/16/19 1000  ampicillin-sulbactam (UNASYN) 1.5 g in sodium chloride 0.9 % 100 mL IVPB     1.5 g 200 mL/hr over 30 Minutes Intravenous Every 6 hours 05/16/19 0952 05/23/19 2359      Subjective: Eager to go home soon, but aware of need for SNF given marked weakness  Objective: Vitals:   06/04/19 2010 06/05/19 0630 06/05/19 0632 06/05/19 1451  BP: (!) 144/83  (!) 144/76 135/82  Pulse: (!) 106  93 (!) 101  Resp: 16  18 20   Temp: 98.4 F (36.9 C)  98.6 F (37 C) 98.6 F (37 C)  TempSrc: Oral  Oral Oral  SpO2: 100%  99% 98%  Weight:  63 kg    Height:        Intake/Output Summary (Last 24 hours) at 06/05/2019 1603 Last data filed at 06/05/2019 1432 Gross per 24 hour  Intake 1080 ml  Output 900 ml  Net  180 ml   Filed Weights   06/03/19 0458 06/04/19 0500 06/05/19 0630  Weight: 62.3 kg 61.3 kg 63 kg    Examination: General exam: Awake, laying in bed, in nad Respiratory system: Normal respiratory effort, no wheezing  Data Reviewed: I have personally reviewed following labs and imaging studies  CBC: Recent Labs  Lab 05/30/19 1552 06/01/19 0611 06/02/19 1631  WBC  --  8.6 5.2  NEUTROABS  --   --  3.7  HGB  --  8.9* 8.6*  HCT 32.2* 30.6* 29.4*  MCV  --  82.5 82.4  PLT  --  236 216   Basic Metabolic Panel: Recent Labs  Lab 06/01/19 0611  NA 136  K 4.2  CL 96*  CO2 30  GLUCOSE 108*  BUN 34*  CREATININE 1.55*  CALCIUM 8.6*   GFR: Estimated Creatinine Clearance: 38.4 mL/min (A) (by C-G formula based on SCr of 1.55 mg/dL (H)). Liver Function Tests: Recent Labs  Lab 05/31/19 0431  AST 20  ALT 12  ALKPHOS 80  BILITOT 0.7  PROT 5.7*  ALBUMIN 1.9*   No results for input(s): LIPASE, AMYLASE in the last 168 hours. No results for input(s): AMMONIA in the last 168 hours. Coagulation Profile: No  results for input(s): INR, PROTIME in the last 168 hours. Cardiac Enzymes: No results for input(s): CKTOTAL, CKMB, CKMBINDEX, TROPONINI in the last 168 hours. BNP (last 3 results) No results for input(s): PROBNP in the last 8760 hours. HbA1C: No results for input(s): HGBA1C in the last 72 hours. CBG: Recent Labs  Lab 06/01/19 0611 06/02/19 0539 06/03/19 0500 06/04/19 0537 06/05/19 0752  GLUCAP 100* 115* 121* 80 110*   Lipid Profile: No results for input(s): CHOL, HDL, LDLCALC, TRIG, CHOLHDL, LDLDIRECT in the last 72 hours. Thyroid Function Tests: No results for input(s): TSH, T4TOTAL, FREET4, T3FREE, THYROIDAB in the last 72 hours. Anemia Panel: No results for input(s): VITAMINB12, FOLATE, FERRITIN, TIBC, IRON, RETICCTPCT in the last 72 hours. Sepsis Labs: No results for input(s): PROCALCITON, LATICACIDVEN in the last 168 hours.  Recent Results (from the past 240 hour(s))  Culture, blood (routine x 2)     Status: None (Preliminary result)   Collection Time: 06/01/19  6:11 AM   Specimen: BLOOD  Result Value Ref Range Status   Specimen Description   Final    BLOOD RIGHT ANTECUBITAL Performed at [facility], [address] [place], [place] [address]    Special Requests   Final    BOTTLES DRAWN AEROBIC AND ANAEROBIC Blood Culture adequate volume Performed at [facility], [address] [place], [place] [address]    Culture   Final    NO GROWTH 4 DAYS Performed at [facility], [address] [place], [place] [address]    Report Status PENDING  Incomplete  Culture, blood (routine x 2)     Status: None (Preliminary result)   Collection Time: 06/01/19  6:13 AM   Specimen: BLOOD RIGHT HAND  Result Value Ref Range Status   Specimen Description   Final    BLOOD RIGHT HAND Performed at [facility], [address] [place], [place] [address]    Special Requests   Final    BOTTLES DRAWN AEROBIC AND ANAEROBIC  Blood Culture adequate volume Performed at [facility], [address] [place], [place] [address]    Culture   Final    NO GROWTH 4 DAYS Performed at [facility], [address] [place], [place] [address]    Report Status PENDING  Incomplete     Radiology Studies: No results found.  Scheduled Meds: . buPROPion  150 mg Oral Daily  . clotrimazole   Topical BID  . enoxaparin (LOVENOX) injection  40 mg Subcutaneous Q24H  . ethambutol  1,200 mg Oral Daily  . feeding supplement (ENSURE ENLIVE)  237 mL Oral Q24H  . ferrous sulfate  325 mg Oral BID WC  . ipratropium  2 puff Inhalation TID  . isoniazid  300 mg Oral Daily   And  . vitamin B-6  50 mg Oral Daily  . loratadine  10 mg Oral Q breakfast  . mouth rinse  15 mL Mouth Rinse BID  . mometasone-formoterol  2 puff Inhalation BID  . multivitamin with minerals  1 tablet Oral Daily  . pantoprazole  40 mg Oral Q0600  . pravastatin  20 mg Oral q1800  . pyrazinamide  1,500 mg Oral Daily  . rifampin  600 mg Oral Daily  . thiamine  100 mg Oral Daily   Continuous Infusions: . sodium chloride Stopped (05/23/19 0250)     LOS: 21 days   Rickey Barbara, MD [facility] Pager On Amion  If 7PM-7AM, please contact night-coverage 06/05/2019, 4:03 PM

## 2019-06-05 NOTE — Progress Notes (Signed)
[facility]    Date of Admission:  05/15/2019   Total days of antibiotics 21/18 RIPE           ID: Clarence Maldonado is a 73 y.o. male with pulmonary mTB wasting and deconditioning Principal Problem:   Aspiration pneumonia (HCC) Active Problems:   Essential hypertension   CKD (chronic kidney disease) stage 3, GFR 30-59 ml/min (HCC)   Hyperlipidemia   Tobacco use disorder   Controlled type 2 diabetes mellitus with stage 3 chronic kidney disease, without long-term current use of insulin (HCC)   Weight loss   Hyperlipidemia associated with type 2 diabetes mellitus ([place])   Cavitary lesion of lung   Hypokalemia   Cavitary pneumonia   Anemia    Subjective: Afebrile, rash improved but still visible, not itching; declined to work with PT today  Medications:  . buPROPion  150 mg Oral Daily  . clotrimazole   Topical BID  . enoxaparin (LOVENOX) injection  40 mg Subcutaneous Q24H  . ethambutol  1,200 mg Oral Daily  . feeding supplement (ENSURE ENLIVE)  237 mL Oral Q24H  . ferrous sulfate  325 mg Oral BID WC  . ipratropium  2 puff Inhalation TID  . isoniazid  300 mg Oral Daily   And  . vitamin B-6  50 mg Oral Daily  . loratadine  10 mg Oral Q breakfast  . mouth rinse  15 mL Mouth Rinse BID  . mometasone-formoterol  2 puff Inhalation BID  . multivitamin with minerals  1 tablet Oral Daily  . pantoprazole  40 mg Oral Q0600  . pravastatin  20 mg Oral q1800  . pyrazinamide  1,500 mg Oral Daily  . rifampin  600 mg Oral Daily  . thiamine  100 mg Oral Daily    Objective: Vital signs in last 24 hours: Temp:  [98.2 F (36.8 C)-98.6 F (37 C)] 98.6 F (37 C) (08/31 1451) Pulse Rate:  [93-111] 101 (08/31 1451) Resp:  [16-20] 20 (08/31 1451) BP: (134-144)/(76-83) 135/82 (08/31 1451) SpO2:  [98 %-100 %] 98 % (08/31 1451) Weight:  [63 kg] 63 kg (08/31 0630) Physical Exam  Constitutional: He is oriented to person, place, and time. He appears  wasted/mal-nourished. No distress.  HENT:  Mouth/Throat: Oropharynx is clear and moist. No oropharyngeal exudate.  Cardiovascular: Normal rate, regular rhythm and normal heart sounds. Exam reveals no gallop and no friction rub.  No murmur heard.  Pulmonary/Chest: Effort normal and breath sounds mild rhonchi bilaterally Abdominal: Soft. Bowel sounds are normal. He exhibits no distension. There is no tenderness.  Ext: muscle loss most noteable on BLE Psychiatric: He has a normal mood and affect. His behavior is normal.     Lab Results No results for input(s): WBC, HGB, HCT, NA, K, CL, CO2, BUN, CREATININE, GLU in the last 72 hours.  Invalid input(s): PLATELETS Liver Panel No results for input(s): PROT, ALBUMIN, AST, ALT, ALKPHOS, BILITOT, BILIDIR, IBILI in the last 72 hours. Sedimentation Rate No results for input(s): ESRSEDRATE in the last 72 hours. C-Reactive Protein No results for input(s): CRP in the last 72 hours.  Microbiology:  Studies/Results: No results found.   Assessment/Plan: Pulmonary mTB = will recommend to get AFB smear/sputum to see if AFB burden is any less than when he started therapy. Given his burden,he will likely take 4-6 wk to be clear. Continue with RIPE- plus vitB6.   Drug induced rash = continue with claritin  Deconditioning = will need  encouragement to try to get him OOB, in current state he is unsafe to get home. Likely skilled facility if unable to have family/friends take him in.  Weight loss = likely from primary process. Continue to encourage supplementation of diet/more snacks  [facility] Cell: [phone] Pager: [phone]  06/05/2019, 6:33 PM

## 2019-06-06 DIAGNOSIS — A150 Tuberculosis of lung: Secondary | ICD-10-CM | POA: Diagnosis present

## 2019-06-06 DIAGNOSIS — R21 Rash and other nonspecific skin eruption: Secondary | ICD-10-CM

## 2019-06-06 DIAGNOSIS — E440 Moderate protein-calorie malnutrition: Secondary | ICD-10-CM | POA: Diagnosis present

## 2019-06-06 DIAGNOSIS — R64 Cachexia: Secondary | ICD-10-CM

## 2019-06-06 DIAGNOSIS — Z6822 Body mass index (BMI) 22.0-22.9, adult: Secondary | ICD-10-CM

## 2019-06-06 LAB — CULTURE, BLOOD (ROUTINE X 2)
Culture: NO GROWTH
Culture: NO GROWTH
Special Requests: ADEQUATE
Special Requests: ADEQUATE

## 2019-06-06 LAB — GLUCOSE, CAPILLARY: Glucose-Capillary: 138 mg/dL — ABNORMAL HIGH (ref 70–99)

## 2019-06-06 MED ORDER — DEXTROSE 50 % IV SOLN
INTRAVENOUS | Status: AC
  Filled 2019-06-06: qty 50

## 2019-06-06 NOTE — Progress Notes (Signed)
Patient ID: Clarence Maldonado, male   DOB: [birth year], 73 y.o.   MRN: [id number]         [facility]  Date of Admission:  05/15/2019           Day 19 4 drug TB therapy (RIPE) ASSESSMENT: He is improving clinically on standard 4 drug therapy for cavitary TB pneumonia.  His rash is fading.  PLAN: 1. Continue RIPE 2. Continue Claritin 3. Submit sputum specimen for repeat AFB smear and culture  Principal Problem:   Pulmonary tuberculosis with cavitation Active Problems:   Rash   Essential hypertension   CKD (chronic kidney disease) stage 3, GFR 30-59 ml/min (HCC)   Hyperlipidemia   Tobacco use disorder   Controlled type 2 diabetes mellitus with stage 3 chronic kidney disease, without long-term current use of insulin (HCC)   Weight loss   Hyperlipidemia associated with type 2 diabetes mellitus (HCC)   Anemia   Moderate protein-calorie malnutrition (HCC)   Scheduled Meds: . buPROPion  150 mg Oral Daily  . clotrimazole   Topical BID  . enoxaparin (LOVENOX) injection  40 mg Subcutaneous Q24H  . ethambutol  1,200 mg Oral Daily  . feeding supplement (ENSURE ENLIVE)  237 mL Oral Q24H  . ferrous sulfate  325 mg Oral BID WC  . ipratropium  2 puff Inhalation TID  . isoniazid  300 mg Oral Daily   And  . vitamin B-6  50 mg Oral Daily  . loratadine  10 mg Oral Q breakfast  . mouth rinse  15 mL Mouth Rinse BID  . mometasone-formoterol  2 puff Inhalation BID  . multivitamin with minerals  1 tablet Oral Daily  . pantoprazole  40 mg Oral Q0600  . pravastatin  20 mg Oral q1800  . pyrazinamide  1,500 mg Oral Daily  . rifampin  600 mg Oral Daily  . thiamine  100 mg Oral Daily   Continuous Infusions: . sodium chloride Stopped (05/23/19 0250)   PRN Meds:.sodium chloride, acetaminophen **OR** acetaminophen, alum & mag hydroxide-simeth, dextrose, HYDROcodone-acetaminophen, hydrocortisone cream, levalbuterol, loperamide, magic mouthwash w/lidocaine, ondansetron  **OR** ondansetron (ZOFRAN) IV, phenol, Resource ThickenUp Clear, zolpidem   SUBJECTIVE: He tells me that he is feeling "100% better" than he was upon admission.  His appetite is improving.  He still has some cough productive of thin clear sputum.  He also notes continued shortness of breath.  He fatigues very easily.  He says that his rash has improved.  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  He tells me that he does not have any family that can help him.  Review of Systems: Review of Systems  Constitutional: Positive for malaise/fatigue and weight loss. Negative for chills, diaphoresis and fever.  Respiratory: Positive for cough, sputum production and shortness of breath. Negative for hemoptysis.   Cardiovascular: Negative for chest pain.  Gastrointestinal: Negative for abdominal pain, diarrhea, nausea and vomiting.    No Known Allergies  OBJECTIVE: Vitals:   06/05/19 1451 06/05/19 2122 06/06/19 0300 06/06/19 0617  BP: 135/82 136/85 134/81   Pulse: (!) 101 (!) 109 (!) 103   Resp: 20 19 18    Temp: 98.6 F (37 C) 98.5 F (36.9 C) 98.6 F (37 C)   TempSrc: Oral Oral Oral   SpO2: 98% 100% 99%   Weight:    62.1 kg  Height:       Body mass index is 22.1 kg/m.  Physical Exam  Constitutional:      Comments: He is talkative and in good spirits.  He remains very cachectic but he has gained 17 pounds since admission.  Cardiovascular:     Rate and Rhythm: Normal rate and regular rhythm.     Heart sounds: No murmur.  Pulmonary:     Effort: Pulmonary effort is normal.     Breath sounds: Rhonchi present. No wheezing or rales.     Comments: He has a small amount of clear, thin sputum in a cup on his bedside table. Abdominal:     Palpations: Abdomen is soft.     Tenderness: There is no abdominal tenderness.  Skin:    Findings: Erythema present.     Comments: Fading, splotchy erythema on upper arms.  Psychiatric:        Mood and Affect: Mood  normal.     Lab Results Lab Results  Component Value Date   WBC 5.2 06/02/2019   HGB 8.6 (L) 06/02/2019   HCT 29.4 (L) 06/02/2019   MCV 82.4 06/02/2019   PLT 216 06/02/2019    Lab Results  Component Value Date   CREATININE 1.55 (H) 06/01/2019   BUN 34 (H) 06/01/2019   NA 136 06/01/2019   K 4.2 06/01/2019   CL 96 (L) 06/01/2019   CO2 30 06/01/2019    Lab Results  Component Value Date   ALT 12 05/31/2019   AST 20 05/31/2019   ALKPHOS 80 05/31/2019   BILITOT 0.7 05/31/2019     Microbiology: Recent Results (from the past 240 hour(s))  Culture, blood (routine x 2)     Status: None   Collection Time: 06/01/19  6:11 AM   Specimen: BLOOD  Result Value Ref Range Status   Specimen Description   Final    BLOOD RIGHT ANTECUBITAL Performed at [facility], [address] [place], [place] [address]    Special Requests   Final    BOTTLES DRAWN AEROBIC AND ANAEROBIC Blood Culture adequate volume Performed at [facility], [address] [place], [place] [address]    Culture   Final    NO GROWTH 5 DAYS Performed at [facility], [address] [place], [place] [address]    Report Status 06/06/2019 FINAL  Final  Culture, blood (routine x 2)     Status: None   Collection Time: 06/01/19  6:13 AM   Specimen: BLOOD RIGHT HAND  Result Value Ref Range Status   Specimen Description   Final    BLOOD RIGHT HAND Performed at [facility], [address], [place], [place] [address]    Special Requests   Final    BOTTLES DRAWN AEROBIC AND ANAEROBIC Blood Culture adequate volume Performed at [facility], [address] [place], [place] [address]    Culture   Final    NO GROWTH 5 DAYS Performed at [facility], [address], [place], [place] [address]    Report Status 06/06/2019 FINAL  Final    Cliffton Asters, MD [facility] [phone] pager   [phone] cell 06/06/2019, 12:01 PM

## 2019-06-06 NOTE — Plan of Care (Signed)
  Problem: Health Behavior/Discharge Planning: Goal: Ability to manage health-related needs will improve Outcome: Progressing   Problem: Clinical Measurements: Goal: Ability to maintain clinical measurements within normal limits will improve Outcome: Progressing Goal: Will remain free from infection Outcome: Progressing Goal: Diagnostic test results will improve Outcome: Progressing Goal: Respiratory complications will improve Outcome: Progressing Goal: Cardiovascular complication will be avoided Outcome: Progressing   Problem: Activity: Goal: Risk for activity intolerance will decrease Outcome: Progressing   Problem: Safety: Goal: Ability to remain free from injury will improve Outcome: Progressing   Problem: Skin Integrity: Goal: Risk for impaired skin integrity will decrease Outcome: Progressing   

## 2019-06-06 NOTE — Progress Notes (Signed)
Nutrition Follow-up  DOCUMENTATION CODES:   Not applicable  INTERVENTION:  - continue Ensure Enlive once/day and Magic Cup BID. - continue to encourage PO intakes.    NUTRITION DIAGNOSIS:   Inadequate oral intake related to poor appetite as evidenced by per patient/family report. -resolving  GOAL:   Patient will meet greater than or equal to 90% of their needs -met on average  MONITOR:   PO intake, Supplement acceptance, Labs, Weight trends, I & O's  ASSESSMENT:   73 y.o. male with medical history significant of cardiomegaly, stroke, COPD,   CKD stage III, tobacco abuse, DM 2, HTN,  Admitted for cavitary lesions unclear etiology work-up for TB versus malignancy  Weight has been stable over the past 2 weeks. He has been accepting Ensure 75% of the time offered. Per flow sheet, patient consumed the following recently:  8/28--50% of dinner (470 kcal, 17 grams protein) 8/29--75% of breakfast and 100% of lunch and dinner (total of 2410 kcal, 98 grams protein) 8/30--75% of dinner (736 kcal, 26 grams protein) 8/31--50% of breakfast, 100% of lunch, 0% of dinner (total of 1357 kcal, 68 grams protein)   Per notes: - acute respiratory failure with hypoxia 2/2 TB - very unsafe d/c home d/t extremely poor living conditions and debility - dysphagia with throat pain with swallowing - peripheral neuropathy - iron deficiency anemia - PCM - stage 3 CKD--stable    Labs reviewed; CBG: 138 mg/dl today; 8/26--Cl: 96 mmol/l, BUN: 34 mg/dl, creatinine: 1.55 mg/dl, Ca: 8.6 mg/dl, GFR: 44 ml/min. Medications reviewed; 325 mg ferrous sulfate BID, 50 mg vitamin B6/day, daily multivitamin with minerals, 100 mg thiamine/day.     Diet Order:   Diet Order            DIET - DYS 1 Room service appropriate? Yes with Assist; Fluid consistency: Nectar Thick  Diet effective now              EDUCATION NEEDS:   No education needs have been identified at this time  Skin:  Skin Assessment:  Reviewed RN Assessment  Last BM:  9/1  Height:   Ht Readings from Last 1 Encounters:  05/16/19 '5\' 6"'  (1.676 m)    Weight:   Wt Readings from Last 1 Encounters:  06/06/19 62.1 kg    Ideal Body Weight:  64.5 kg  BMI:  Body mass index is 22.1 kg/m.  Estimated Nutritional Needs:   Kcal:  1600-1800  Protein:  70-80g  Fluid:  1.8L/day     Jarome Matin, MS, RD, LDN, CNSC Inpatient Clinical Dietitian Pager # [phone] After hours/weekend pager # [phone]

## 2019-06-06 NOTE — Progress Notes (Signed)
PROGRESS NOTE    Clarence Maldonado  CXK:[id number] DOB: 1946-06-26 DOA: 05/15/2019 PCP: [name], DO    Brief Narrative:  73 year old male with history of stroke, COPD, CKD stage III, tobacco use, diabetes mellitus type 2, hypertension presented to the ED with progressive weakness for past 3 months with frequent falls.  He called EMS after a fall.  Reported poor p.o. intake and losing almost 43 pounds in the past 2 months.  No prior history of TB or exposure.  In the ED COVID-19 was tested negative.  CT of the chest showed multiple cavitary lesion suggestive of malignancy versus TB/atypical infection. Sputum for AFB and QuantiFERON gold positive.  MTB PCR also tested positive.  Hospital course complicated with patient developing acute hypoxic respiratory failure on 8/11 with ABG showing significant hypoxemia and hypercapnia (PO2 57 and PCO2 45).  Placed on empiric IV antibiotics.  Also found to have significant dysphagia.  Assessment & Plan:   Principal Problem:   Pulmonary tuberculosis with cavitation Active Problems:   Essential hypertension   CKD (chronic kidney disease) stage 3, GFR 30-59 ml/min (HCC)   Hyperlipidemia   Tobacco use disorder   Controlled type 2 diabetes mellitus with stage 3 chronic kidney disease, without long-term current use of insulin (HCC)   Weight loss   Hyperlipidemia associated with type 2 diabetes mellitus (HCC)   Anemia   Moderate protein-calorie malnutrition (HCC)   Rash   Principal Problem: Acute respiratory failure with hypoxia (HCC) Secondary to pulmonary TB (MTB PCR positive).  Also concerns with aspiration pneumonia.  ID consult appreciated and started on 4 drug therapy (rifampin, isoniazid by acetamide and ethambutol for 2 months to cover both latent and presumed active TB. continue B6 and thiamine.  Health department notified. Repeat AFB sputum ordered on 8/28, pending  Concerns of developing rash which may be related to RIPE tx.  Continue claritin as tolerated for rash per ID  Presently weaned to room air  Continue airborne isolation. Completed antibiotics for aspiration pneumonia.  Very unsafe discharge home given extremely poor living conditions and debility. Pending repeat sputum for repeat AFB. If AFB sputum neg, possibility for d/c to SNF  Active Problems: Dysphagia Moderate oropharyngeal dysphagia with sensorimotor deficit noted on MBS also noted hypomotility of the pharynx.  High aspiration risk.  Continue dysphagia level 1 diet. Presently stable  Throat pain on swallowing Continue Chloraseptic spray with Magic mouthwash.  No thrush noted recently  Peripheral neuropathy Possibly associated with isoniazid/ ?  Rifampin.  Continue B6.  He does have iron deficiency anemia and started on supplement.  Reports some improvement.  TSH 2 months back was normal, repeat of 4.117.   labs reviewed. B12 of 483.  RPR reviewed and was non reactive  ?  Mass in left atrium on 2D echo Unable to rule out vegetation.  Comments on severe thickening of the mitral valve leaflet with severe calcification with severe and bulky posterior annular calcification.  Subcostal images suggest mobile mass in the right atrium.  Cardiology consulted and Dr. Johney Maine had discussed with Dr. Delton See who reviewed the echo and suggested the mass might be reflective of nonspecific congenital abnormality. Recommend Cardiology to follow up as outpatient  Nonsustained V. tach In the setting of persistent hypokalemia and hypomagnesemia.  replaced electrolytes. -Remains stable at this time  Hypokalemia Recent labs reviewed, resolved  Will repeat bmet in AM  Iron deficiency anemia Low iron panel, normal B12 (483). Hemoccult negative.  Added iron supplement. Remains  hemodynamically stable at this  Type 2 diabetes mellitus, controlled Secondary to severe weight loss and poor p.o. intake.  A1c of 6.3.  Monitor on sliding scale coverage only.   Adequate p.o. intake encouraged Glycemic trends reviewed. Stable  Weight loss with protein calorie malnutrition,  Would benefit from SNF, however disoposition issues given airborne precautionsunderlying TB and failure to thrive.  Nutrition and PT consult appreciated.    Chronic kidney disease stage 3 Currently stable. Avoid nephrotoxins.  DVT prophylaxis: Lovenox subQ Code Status: Full Family Communication: Pt in room, family not at bedside Disposition Plan: Uncertain at this time  Consultants:   ID  Pulmonary  Procedures:     Antimicrobials: Anti-infectives (From admission, onward)   Start     Dose/Rate Route Frequency Ordered Stop   06/03/19 1000  ethambutol (MYAMBUTOL) tablet 1,200 mg     1,200 mg Oral Daily 06/02/19 1735     06/03/19 1000  isoniazid (NYDRAZID) tablet 300 mg     300 mg Oral Daily 06/02/19 1735     06/03/19 1000  pyrazinamide tablet 1,500 mg     1,500 mg Oral Daily 06/02/19 1735     06/03/19 1000  rifampin (RIFADIN) capsule 600 mg     600 mg Oral Daily 06/02/19 1735     05/24/19 1000  pyrazinamide tablet 1,500 mg  Status:  Discontinued     1,500 mg Oral Daily 05/23/19 1626 06/02/19 1555   05/24/19 1000  ethambutol (MYAMBUTOL) tablet 1,200 mg  Status:  Discontinued     1,200 mg Oral Daily 05/23/19 1626 06/02/19 1555   05/19/19 1600  isoniazid (NYDRAZID) tablet 300 mg  Status:  Discontinued     300 mg Oral Daily 05/19/19 1428 06/02/19 1555   05/19/19 1600  rifampin (RIFADIN) capsule 600 mg  Status:  Discontinued     600 mg Oral Daily 05/19/19 1428 06/02/19 1555   05/19/19 1600  pyrazinamide tablet 1,000 mg  Status:  Discontinued     1,000 mg Oral Daily 05/19/19 1428 05/23/19 1626   05/19/19 1600  ethambutol (MYAMBUTOL) tablet 800 mg  Status:  Discontinued     800 mg Oral Daily 05/19/19 1428 05/23/19 1626   05/18/19 0000  vancomycin (VANCOCIN) IVPB 1000 mg/200 mL premix  Status:  Discontinued     1,000 mg 200 mL/hr over 60 Minutes Intravenous Every  36 hours 05/16/19 1219 05/17/19 1058   05/16/19 1130  vancomycin (VANCOCIN) 1,250 mg in sodium chloride 0.9 % 250 mL IVPB     1,250 mg 166.7 mL/hr over 90 Minutes Intravenous  Once 05/16/19 1100 05/16/19 1404   05/16/19 1000  ampicillin-sulbactam (UNASYN) 1.5 g in sodium chloride 0.9 % 100 mL IVPB     1.5 g 200 mL/hr over 30 Minutes Intravenous Every 6 hours 05/16/19 0952 05/23/19 2359      Subjective: Very eager for discharge soon  Objective: Vitals:   06/05/19 2122 06/06/19 0300 06/06/19 0617 06/06/19 1426  BP: 136/85 134/81  138/87  Pulse: (!) 109 (!) 103  (!) 110  Resp: 19 18  18   Temp: 98.5 F (36.9 C) 98.6 F (37 C)  98.5 F (36.9 C)  TempSrc: Oral Oral  Oral  SpO2: 100% 99%  100%  Weight:   62.1 kg   Height:        Intake/Output Summary (Last 24 hours) at 06/06/2019 1558 Last data filed at 06/06/2019 1300 Gross per 24 hour  Intake 720 ml  Output 1200 ml  Net -480 ml  Filed Weights   06/04/19 0500 06/05/19 0630 06/06/19 0617  Weight: 61.3 kg 63 kg 62.1 kg    Examination: General exam: Conversant, in no acute distress Respiratory system: normal chest rise, clear, no audible wheezing Cardiovascular system: perfused Gastrointestinal system: Nondistended, nontender Central nervous system: No seizures, no tremors Extremities: No cyanosis, no joint deformities Skin: No rashes, no pallor Psychiatry: Affect normal // no auditory hallucinations    Data Reviewed: I have personally reviewed following labs and imaging studies  CBC: Recent Labs  Lab 06/01/19 0611 06/02/19 1631  WBC 8.6 5.2  NEUTROABS  --  3.7  HGB 8.9* 8.6*  HCT 30.6* 29.4*  MCV 82.5 82.4  PLT 236 216   Basic Metabolic Panel: Recent Labs  Lab 06/01/19 0611  NA 136  K 4.2  CL 96*  CO2 30  GLUCOSE 108*  BUN 34*  CREATININE 1.55*  CALCIUM 8.6*   GFR: Estimated Creatinine Clearance: 37.8 mL/min (A) (by C-G formula based on SCr of 1.55 mg/dL (H)). Liver Function Tests: Recent Labs   Lab 05/31/19 0431  AST 20  ALT 12  ALKPHOS 80  BILITOT 0.7  PROT 5.7*  ALBUMIN 1.9*   No results for input(s): LIPASE, AMYLASE in the last 168 hours. No results for input(s): AMMONIA in the last 168 hours. Coagulation Profile: No results for input(s): INR, PROTIME in the last 168 hours. Cardiac Enzymes: No results for input(s): CKTOTAL, CKMB, CKMBINDEX, TROPONINI in the last 168 hours. BNP (last 3 results) No results for input(s): PROBNP in the last 8760 hours. HbA1C: No results for input(s): HGBA1C in the last 72 hours. CBG: Recent Labs  Lab 06/02/19 0539 06/03/19 0500 06/04/19 0537 06/05/19 0752 06/06/19 0612  GLUCAP 115* 121* 80 110* 138*   Lipid Profile: No results for input(s): CHOL, HDL, LDLCALC, TRIG, CHOLHDL, LDLDIRECT in the last 72 hours. Thyroid Function Tests: No results for input(s): TSH, T4TOTAL, FREET4, T3FREE, THYROIDAB in the last 72 hours. Anemia Panel: No results for input(s): VITAMINB12, FOLATE, FERRITIN, TIBC, IRON, RETICCTPCT in the last 72 hours. Sepsis Labs: No results for input(s): PROCALCITON, LATICACIDVEN in the last 168 hours.  Recent Results (from the past 240 hour(s))  Culture, blood (routine x 2)     Status: None   Collection Time: 06/01/19  6:11 AM   Specimen: BLOOD  Result Value Ref Range Status   Specimen Description   Final    BLOOD RIGHT ANTECUBITAL Performed at [facility], [address] [place], [place] [address]    Special Requests   Final    BOTTLES DRAWN AEROBIC AND ANAEROBIC Blood Culture adequate volume Performed at [facility], [address] [place], [place] [address]    Culture   Final    NO GROWTH 5 DAYS Performed at [facility], [address] [place], [place] [address]    Report Status 06/06/2019 FINAL  Final  Culture, blood (routine x 2)     Status: None   Collection Time: 06/01/19  6:13 AM   Specimen: BLOOD RIGHT HAND  Result Value Ref Range Status    Specimen Description   Final    BLOOD RIGHT HAND Performed at [facility], [address] [place], [place] [address]    Special Requests   Final    BOTTLES DRAWN AEROBIC AND ANAEROBIC Blood Culture adequate volume Performed at [facility], [address] [place], [place] [address]    Culture   Final  NO GROWTH 5 DAYS Performed at [facility], [address] [place], [place] [address]    Report Status 06/06/2019 FINAL  Final     Radiology Studies: No results found.  Scheduled Meds: . buPROPion  150 mg Oral Daily  . clotrimazole   Topical BID  . enoxaparin (LOVENOX) injection  40 mg Subcutaneous Q24H  . ethambutol  1,200 mg Oral Daily  . feeding supplement (ENSURE ENLIVE)  237 mL Oral Q24H  . ferrous sulfate  325 mg Oral BID WC  . ipratropium  2 puff Inhalation TID  . isoniazid  300 mg Oral Daily   And  . vitamin B-6  50 mg Oral Daily  . loratadine  10 mg Oral Q breakfast  . mouth rinse  15 mL Mouth Rinse BID  . mometasone-formoterol  2 puff Inhalation BID  . multivitamin with minerals  1 tablet Oral Daily  . pantoprazole  40 mg Oral Q0600  . pravastatin  20 mg Oral q1800  . pyrazinamide  1,500 mg Oral Daily  . rifampin  600 mg Oral Daily  . thiamine  100 mg Oral Daily   Continuous Infusions: . sodium chloride Stopped (05/23/19 0250)     LOS: 22 days   Rickey BarbaraStephen , MD [facility] Pager On Amion  If 7PM-7AM, please contact night-coverage 06/06/2019, 3:58 PM

## 2019-06-06 NOTE — Progress Notes (Signed)
  Speech Language Pathology Treatment: Dysphagia  Patient Details Name: Clarence Maldonado MRN: [id number] DOB: December 21, 1945 Today's Date: 06/06/2019 Time: 3154-0086 SLP Time Calculation (min) (ACUTE ONLY): 61 min  Assessment / Plan / Recommendation Clinical Impression  Today pt appears confused, stating he is near the "OR" and that a "social worker" was going to come see him soon. Stating "she called earlier today and was coming and did not but we were going to work on this breathing thin".   I advised him that I called earlier and that I was coming to work with him.  Pt had requested tea - which SLP provided fresh tea thin and thickened. He initially tolerated thin without indication of aspiration, however after 5th swallow, pt with overt cough.  He then consumed nectar thick without airway compromise. SLP also addressed pt's RMST with him - and he states he is feeling his "heart beat" during this process.  SLP then modified it to level 5 and advised he only use the flutter valve as he demonstrated much improved comfort level with this device.  Pt appears with mild distention of abdomen today but he denies discomfort and states he had a bowel movement earlier today.  Pt's intake has decreased dramatically - he consumed NO Lunch despite SLP efforts to have him consume magic cups.  Pt has been consuming intake adequately prior to this time.  Advise close monitoring of patient given said changes. Informed covering RN (RN at lunch) of concerns.  Will follow up.    HPI HPI: 73 year old male admitted 05/15/2019 with progressive weakness and cough, failure to thrive. Pt lives alone. PMH: cardiomegaly, CVA (2009), COPD, HTN, DM, obesity, CKD III, tobacco abuse. Lung sounds: some scattered rhonchi. Course rhonchus cough.  CXR = bilateral infiltrates. Chest CT = bilateral diffuse nodular infiltrates with cavitation and a large right upper lobe cystic lesion      SLP Plan  Continue with current plan of care        Recommendations  Diet recommendations: Dysphagia 1 (puree);Nectar-thick liquid;Thin liquid Liquids provided via: Cup;Straw Medication Administration: Whole meds with puree Supervision: Patient able to self feed Compensations: Minimize environmental distractions;Slow rate;Small sips/bites Postural Changes and/or Swallow Maneuvers: Seated upright 90 degrees;Upright 30-60 min after meal                Oral Care Recommendations: Oral care BID Follow up Recommendations: Home health SLP SLP Visit Diagnosis: Dysphagia, oropharyngeal phase (R13.12) Plan: Continue with current plan of care       GO                [name] 06/06/2019, 4:15 PM   Luanna Salk, [facility] SLP [place] Pager [phone] Office [phone]

## 2019-06-07 LAB — HEPATIC FUNCTION PANEL
ALT: 13 U/L (ref 0–44)
AST: 18 U/L (ref 15–41)
Albumin: 2.3 g/dL — ABNORMAL LOW (ref 3.5–5.0)
Alkaline Phosphatase: 83 U/L (ref 38–126)
Bilirubin, Direct: 0.1 mg/dL (ref 0.0–0.2)
Indirect Bilirubin: 0.2 mg/dL — ABNORMAL LOW (ref 0.3–0.9)
Total Bilirubin: 0.3 mg/dL (ref 0.3–1.2)
Total Protein: 6.3 g/dL — ABNORMAL LOW (ref 6.5–8.1)

## 2019-06-07 LAB — ACID FAST SMEAR (AFB, MYCOBACTERIA): Acid Fast Smear: POSITIVE — AB

## 2019-06-07 LAB — GLUCOSE, CAPILLARY: Glucose-Capillary: 93 mg/dL (ref 70–99)

## 2019-06-07 MED ORDER — DEXTROSE 50 % IV SOLN
INTRAVENOUS | Status: AC
  Filled 2019-06-07: qty 50

## 2019-06-07 NOTE — Progress Notes (Signed)
Patient ID: Clarence Maldonado, male   DOB: January 28, 1946, 73 y.o.   MRN: [id number]         [facility]  Date of Admission:  05/15/2019           Day 20 4 drug TB therapy (RIPE) ASSESSMENT: He is improving slowly on standard 4 drug therapy for cavitary TB pneumonia.    PLAN: 1. Continue RIPE 2. Continue Claritin 3. Await results of repeat AFB smear and culture  Principal Problem:   Pulmonary tuberculosis with cavitation Active Problems:   Rash   Essential hypertension   CKD (chronic kidney disease) stage 3, GFR 30-59 ml/min (HCC)   Hyperlipidemia   Tobacco use disorder   Controlled type 2 diabetes mellitus with stage 3 chronic kidney disease, without long-term current use of insulin (HCC)   Weight loss   Hyperlipidemia associated with type 2 diabetes mellitus (HCC)   Anemia   Moderate protein-calorie malnutrition (HCC)   Scheduled Meds: . buPROPion  150 mg Oral Daily  . clotrimazole   Topical BID  . enoxaparin (LOVENOX) injection  40 mg Subcutaneous Q24H  . ethambutol  1,200 mg Oral Daily  . feeding supplement (ENSURE ENLIVE)  237 mL Oral Q24H  . ferrous sulfate  325 mg Oral BID WC  . ipratropium  2 puff Inhalation TID  . isoniazid  300 mg Oral Daily   And  . vitamin B-6  50 mg Oral Daily  . loratadine  10 mg Oral Q breakfast  . mouth rinse  15 mL Mouth Rinse BID  . mometasone-formoterol  2 puff Inhalation BID  . multivitamin with minerals  1 tablet Oral Daily  . pantoprazole  40 mg Oral Q0600  . pravastatin  20 mg Oral q1800  . pyrazinamide  1,500 mg Oral Daily  . rifampin  600 mg Oral Daily  . thiamine  100 mg Oral Daily   Continuous Infusions: . sodium chloride Stopped (05/23/19 0250)   PRN Meds:.sodium chloride, acetaminophen **OR** acetaminophen, alum & mag hydroxide-simeth, dextrose, HYDROcodone-acetaminophen, hydrocortisone cream, levalbuterol, loperamide, magic mouthwash w/lidocaine, ondansetron **OR** ondansetron (ZOFRAN) IV,  phenol, Resource ThickenUp Clear, zolpidem   SUBJECTIVE: He still notes some shortness of breath if he is not using his supplemental nasal cannula oxygen.  He tells me that last night to women came into his room and started yelling at him.  He says that he was very scared.  Review of Systems: Review of Systems  Constitutional: Positive for malaise/fatigue and weight loss. Negative for chills, diaphoresis and fever.  Respiratory: Positive for cough, sputum production and shortness of breath. Negative for hemoptysis.   Cardiovascular: Negative for chest pain.  Gastrointestinal: Negative for abdominal pain, diarrhea, nausea and vomiting.    No Known Allergies  OBJECTIVE: Vitals:   06/06/19 1426 06/06/19 2004 06/06/19 2106 06/07/19 1323  BP: 138/87  133/71 (!) 147/84  Pulse: (!) 110  (!) 109 (!) 109  Resp: 18  20 20   Temp: 98.5 F (36.9 C)  98.2 F (36.8 C) 97.6 F (36.4 C)  TempSrc: Oral  Oral Oral  SpO2: 100% 98% (!) 84% 100%  Weight:      Height:       Body mass index is 22.1 kg/m.  Physical Exam Constitutional:      Comments: He is talkative and in good spirits.  He switches topics frequently.  He appears to be somewhat confused.  Cardiovascular:     Rate and Rhythm: Normal rate and regular rhythm.  Heart sounds: No murmur.  Pulmonary:     Effort: Pulmonary effort is normal.     Breath sounds: Rhonchi present. No wheezing or rales.  Abdominal:     Palpations: Abdomen is soft.     Tenderness: There is no abdominal tenderness.  Skin:    Findings: Erythema present.     Comments: Fading erythema on upper arms.  Psychiatric:        Mood and Affect: Mood normal.     Lab Results Lab Results  Component Value Date   WBC 5.2 06/02/2019   HGB 8.6 (L) 06/02/2019   HCT 29.4 (L) 06/02/2019   MCV 82.4 06/02/2019   PLT 216 06/02/2019    Lab Results  Component Value Date   CREATININE 1.55 (H) 06/01/2019   BUN 34 (H) 06/01/2019   NA 136 06/01/2019   K 4.2  06/01/2019   CL 96 (L) 06/01/2019   CO2 30 06/01/2019    Lab Results  Component Value Date   ALT 13 06/07/2019   AST 18 06/07/2019   ALKPHOS 83 06/07/2019   BILITOT 0.3 06/07/2019     Microbiology: Recent Results (from the past 240 hour(s))  Culture, blood (routine x 2)     Status: None   Collection Time: 06/01/19  6:11 AM   Specimen: BLOOD  Result Value Ref Range Status   Specimen Description   Final    BLOOD RIGHT ANTECUBITAL Performed at [facility], [place] [address] [place], [place] [address]    Special Requests   Final    BOTTLES DRAWN AEROBIC AND ANAEROBIC Blood Culture adequate volume Performed at [place] [address] [place], [place] [address]    Culture   Final    NO GROWTH 5 DAYS Performed at [facility], [place] [address] [place], [place] [address]    Report Status 06/06/2019 FINAL  Final  Culture, blood (routine x 2)     Status: None   Collection Time: 06/01/19  6:13 AM   Specimen: BLOOD RIGHT HAND  Result Value Ref Range Status   Specimen Description   Final    BLOOD RIGHT HAND Performed at [place] [address], [place], [place] [address]    Special Requests   Final    BOTTLES DRAWN AEROBIC AND ANAEROBIC Blood Culture adequate volume Performed at [place] [address], [place], [place] [address]    Culture   Final    NO GROWTH 5 DAYS Performed at [facility], [place] [address] [place], [place] [address]    Report Status 06/06/2019 FINAL  Final    Michel Bickers, MD [facility] [place] [facility] [phone] pager   [phone] cell 06/07/2019, 4:50 PM

## 2019-06-07 NOTE — Progress Notes (Signed)
Occupational Therapy Treatment Patient Details Name: Clarence Maldonado MRN: [id number] DOB: 1946/09/10 Today's Date: 06/07/2019    History of present illness 73 y.o. male with medical history significant of cardiomegaly, stroke, COPD,   CKD stage III, tobacco abuse, DM 2, HTN who presented to the emergency room on 8/10 with progressive weakness over the last 3 months to the point where he was falling over. He has lost approximately 43 pounds in the past 2 months. CT scan of chest noted multiple cavitary lesions of unclear etiology with a differential diagnosis of malignancy versus TB versus atypical infection.   OT comments  Walked to window and performed ADL from chair.  sats 94% on 3 liters.  Returned to bed at end of session  Follow Up Recommendations  SNF;Home health OT;Follow surgeon's recommendation for DC plan and follow-up therapies    Equipment Recommendations  3 in 1 bedside commode    Recommendations for Other Services      Precautions / Restrictions Precautions Precautions: Fall Precaution Comments: multiple falls Restrictions Weight Bearing Restrictions: No       Mobility Bed Mobility       Sidelying to sit: Min guard;HOB elevated(use of rail)          Transfers       Sit to Stand: Min assist         General transfer comment: light steadying assistance; cues for UE placement    Balance     Sitting balance-Leahy Scale: Good       Standing balance-Leahy Scale: Poor                             ADL either performed or assessed with clinical judgement   ADL       Grooming: Set up;Sitting   Upper Body Bathing: Set up;Sitting   Lower Body Bathing: Minimal assistance;Sit to/from stand   Upper Body Dressing : Minimal assistance       Toilet Transfer: Minimal assistance;Transfer board;RW(chair)             General ADL Comments: had set up for adl in bathroom.  Pt requested to walk to window.  Needed a sitting break, so  performed adl from the chair.  Returned to bed at the end of session     Vision       Perception     Praxis      Cognition Arousal/Alertness: Awake/alert Behavior During Therapy: [facility] for tasks assessed/performed Overall Cognitive Status: Within Functional Limits for tasks assessed                                 General Comments: per nursing, pt was yelling at staff earlier. Very pleasant with OT.  Had a little confusion, but stayed on task.          Exercises     Shoulder Instructions       General Comments on 3 liters; dyspnea 2/4. sats 94%    Pertinent Vitals/ Pain       Pain Assessment: Faces Faces Pain Scale: No hurt  Home Living                                          Prior Functioning/Environment  Frequency  Min 2X/week        Progress Toward Goals  OT Goals(current goals can now be found in the care plan section)  Progress towards OT goals: Progressing toward goals     Plan      Co-evaluation                 AM-PAC OT "6 Clicks" Daily Activity     Outcome Measure   Help from another person eating meals?: None Help from another person taking care of personal grooming?: A Little Help from another person toileting, which includes using toliet, bedpan, or urinal?: A Little Help from another person bathing (including washing, rinsing, drying)?: A Little Help from another person to put on and taking off regular upper body clothing?: A Little Help from another person to put on and taking off regular lower body clothing?: A Little 6 Click Score: 19    End of Session    OT Visit Diagnosis: Unsteadiness on feet (R26.81)   Activity Tolerance Patient tolerated treatment well   Patient Left in bed;with call bell/phone within reach;with bed alarm set   Nurse Communication          Time: 1751-0258 OT Time Calculation (min): 46 min  Charges: OT General Charges $OT Visit: 1 Visit OT  Treatments $Self Care/Home Management : 23-37 mins $Therapeutic Activity: 8-22 mins  Lesle Chris, OTR/L Acute Rehabilitation Services [phone] [facility] pager [phone] office 06/07/2019   Taylor Spilde 06/07/2019, 1:25 PM

## 2019-06-07 NOTE — Progress Notes (Signed)
PROGRESS NOTE    Clarence Maldonado  WIO:[id number] DOB: [birth year] DOA: 05/15/2019 PCP: Gayland Curry, DO   Brief Narrative: Clarence Maldonado is a 73 y.o. male with history of stroke, COPD, CKD stage III, tobacco use, diabetes mellitus type 2, hypertension. Patient presented secondary to weakness and frequent falls, found to have pulmonary TB.   Assessment & Plan:   Principal Problem:   Pulmonary tuberculosis with cavitation Active Problems:   Essential hypertension   CKD (chronic kidney disease) stage 3, GFR 30-59 ml/min (HCC)   Hyperlipidemia   Tobacco use disorder   Controlled type 2 diabetes mellitus with stage 3 chronic kidney disease, without long-term current use of insulin (HCC)   Weight loss   Hyperlipidemia associated with type 2 diabetes mellitus (HCC)   Anemia   Moderate protein-calorie malnutrition (HCC)   Rash   Pulmonary TB Started on RIPE therapy per ID. Repeat AFB smear/cuture (9/1) pending. -Continue RIPE -ID recommendations -Follow-up AFB smear/culture prior to discharge  Dysphagia Evaluated by speech therapy. MBS significant for evidence of moderate oropharyngeal dysphagia with sensorimotor deficit and hypomotility of the pharynx. Patient deemed a high aspiration risk and placed on a dysphagia 1 diet. -Continue chloraseptic spray  Peripheral neuropathy Concern this could be related to RIPE therapy. Started on B6. Improved.  Heart mass Seen on left atrium. Per chart review, possibly congenital. Recommendation for outpatient cardiology follow-up  Non-sustained V-tach Stable. Hypokalemia and magnesium likely contributed.  Hypokalemia/Hypomagnesemia Resolved.  Iron deficiency anemia -Continue iron supplementation  Diabetes mellitus, type 2 -Continue SSI  Weight loss Nutrition/PT consulted  CKD stage III Stable   DVT prophylaxis: Lovenox Code Status:   Code Status: Full Code Family Communication: None Disposition Plan: Discharge  pending ID recommendations   Consultants:   ID  Procedures:   None  Antimicrobials:  Rifampin, isoniazid, pyrazinamide, ethambutol   Subjective: Some shortness of breath. Wants to go home rather than SNF.  Objective: Vitals:   06/06/19 1426 06/06/19 2004 06/06/19 2106 06/07/19 1323  BP: 138/87  133/71 (!) 147/84  Pulse: (!) 110  (!) 109 (!) 109  Resp: 18  20 20   Temp: 98.5 F (36.9 C)  98.2 F (36.8 C) 97.6 F (36.4 C)  TempSrc: Oral  Oral Oral  SpO2: 100% 98% (!) 84% 100%  Weight:      Height:        Intake/Output Summary (Last 24 hours) at 06/07/2019 1753 Last data filed at 06/07/2019 1453 Gross per 24 hour  Intake 817 ml  Output 1150 ml  Net -333 ml   Filed Weights   06/04/19 0500 06/05/19 0630 06/06/19 0617  Weight: 61.3 kg 63 kg 62.1 kg    Examination:  General exam: Appears calm and comfortable Respiratory system: rhonchi. Respiratory effort normal. Cardiovascular system: S1 & S2 heard, RRR. No murmurs, rubs, gallops or clicks. Gastrointestinal system: Abdomen is nondistended, soft and nontender. No organomegaly or masses felt. Normal bowel sounds heard. Central nervous system: Alert and oriented. No focal neurological deficits. Extremities: No edema. No calf tenderness Skin: No cyanosis. No rashes Psychiatry: Judgement and insight appear normal. Mood & affect appropriate.     Data Reviewed: I have personally reviewed following labs and imaging studies  CBC: Recent Labs  Lab 06/01/19 0611 06/02/19 1631  WBC 8.6 5.2  NEUTROABS  --  3.7  HGB 8.9* 8.6*  HCT 30.6* 29.4*  MCV 82.5 82.4  PLT 236 426   Basic Metabolic Panel: Recent Labs  Lab 06/01/19 [phone]  NA 136  K 4.2  CL 96*  CO2 30  GLUCOSE 108*  BUN 34*  CREATININE 1.55*  CALCIUM 8.6*   GFR: Estimated Creatinine Clearance: 37.8 mL/min (A) (by C-G formula based on SCr of 1.55 mg/dL (H)). Liver Function Tests: Recent Labs  Lab 06/07/19 0453  AST 18  ALT 13  ALKPHOS 83   BILITOT 0.3  PROT 6.3*  ALBUMIN 2.3*   No results for input(s): LIPASE, AMYLASE in the last 168 hours. No results for input(s): AMMONIA in the last 168 hours. Coagulation Profile: No results for input(s): INR, PROTIME in the last 168 hours. Cardiac Enzymes: No results for input(s): CKTOTAL, CKMB, CKMBINDEX, TROPONINI in the last 168 hours. BNP (last 3 results) No results for input(s): PROBNP in the last 8760 hours. HbA1C: No results for input(s): HGBA1C in the last 72 hours. CBG: Recent Labs  Lab 06/03/19 0500 06/04/19 0537 06/05/19 0752 06/06/19 0612 06/07/19 0615  GLUCAP 121* 80 110* 138* 93   Lipid Profile: No results for input(s): CHOL, HDL, LDLCALC, TRIG, CHOLHDL, LDLDIRECT in the last 72 hours. Thyroid Function Tests: No results for input(s): TSH, T4TOTAL, FREET4, T3FREE, THYROIDAB in the last 72 hours. Anemia Panel: No results for input(s): VITAMINB12, FOLATE, FERRITIN, TIBC, IRON, RETICCTPCT in the last 72 hours. Sepsis Labs: No results for input(s): PROCALCITON, LATICACIDVEN in the last 168 hours.  Recent Results (from the past 240 hour(s))  Culture, blood (routine x 2)     Status: None   Collection Time: 06/01/19  6:11 AM   Specimen: BLOOD  Result Value Ref Range Status   Specimen Description   Final    BLOOD RIGHT ANTECUBITAL Performed at [facility], [address] [place], [place] [address]    Special Requests   Final    BOTTLES DRAWN AEROBIC AND ANAEROBIC Blood Culture adequate volume Performed at [facility], [address] [place], [place] [address]    Culture   Final    NO GROWTH 5 DAYS Performed at [facility], [address] [place], [place] [address]    Report Status 06/06/2019 FINAL  Final  Culture, blood (routine x 2)     Status: None   Collection Time: 06/01/19  6:13 AM   Specimen: BLOOD RIGHT HAND  Result Value Ref Range Status   Specimen Description   Final    BLOOD RIGHT HAND  Performed at [facility], [address] [place], [place] [address]    Special Requests   Final    BOTTLES DRAWN AEROBIC AND ANAEROBIC Blood Culture adequate volume Performed at [facility], [address] [place], [place] [address]    Culture   Final    NO GROWTH 5 DAYS Performed at [facility], [address] [place], [place] [address]    Report Status 06/06/2019 FINAL  Final  Acid Fast Smear (AFB)     Status: Abnormal   Collection Time: 06/06/19 12:44 PM   Specimen: Sputum  Result Value Ref Range Status   AFB Specimen Processing Concentration  Final   Acid Fast Smear Positive (A)  Final    Comment: (NOTE) 4+, more than 36 acid-fast bacilli per field at 400X magnification, fluorescent smear REPORTED RESULTS TO THOMAS L ON 06-07-19 AT 1718 FAX CONFORMATION TO [phone] Performed At: [facility] [address] [place], [place] [id number] Jolene SchimkeNagendra Sanjai MD DG:[id number]:[phone]    Source (AFB) SPUTUM  Final    Comment: Performed at  [facility], [address], [place], [place] [address]         Radiology Studies: No results found.      Scheduled Meds: . buPROPion  150 mg Oral Daily  . clotrimazole   Topical BID  . enoxaparin (LOVENOX) injection  40 mg Subcutaneous Q24H  . ethambutol  1,200 mg Oral Daily  . feeding supplement (ENSURE ENLIVE)  237 mL Oral Q24H  . ferrous sulfate  325 mg Oral BID WC  . ipratropium  2 puff Inhalation TID  . isoniazid  300 mg Oral Daily   And  . vitamin B-6  50 mg Oral Daily  . loratadine  10 mg Oral Q breakfast  . mouth rinse  15 mL Mouth Rinse BID  . mometasone-formoterol  2 puff Inhalation BID  . multivitamin with minerals  1 tablet Oral Daily  . pantoprazole  40 mg Oral Q0600  . pravastatin  20 mg Oral q1800  . pyrazinamide  1,500 mg Oral Daily  . rifampin  600 mg Oral Daily  . thiamine  100 mg Oral Daily   Continuous Infusions: . sodium chloride  Stopped (05/23/19 0250)     LOS: 23 days     Jacquelin Hawking, MD [facility] 06/07/2019, 5:53 PM  If 7PM-7AM, please contact night-coverage [email or web address]

## 2019-06-08 LAB — BASIC METABOLIC PANEL
Anion gap: 9 (ref 5–15)
BUN: 34 mg/dL — ABNORMAL HIGH (ref 8–23)
CO2: 29 mmol/L (ref 22–32)
Calcium: 8.6 mg/dL — ABNORMAL LOW (ref 8.9–10.3)
Chloride: 98 mmol/L (ref 98–111)
Creatinine, Ser: 1.44 mg/dL — ABNORMAL HIGH (ref 0.61–1.24)
GFR calc Af Amer: 56 mL/min — ABNORMAL LOW (ref >60)
GFR calc non Af Amer: 48 mL/min — ABNORMAL LOW (ref >60)
Glucose, Bld: 92 mg/dL (ref 70–99)
Potassium: 4.7 mmol/L (ref 3.5–5.1)
Sodium: 136 mmol/L (ref 135–145)

## 2019-06-08 LAB — CBC
HCT: 32.5 % — ABNORMAL LOW (ref 39.0–52.0)
Hemoglobin: 9.3 g/dL — ABNORMAL LOW (ref 13.0–17.0)
MCH: 24 pg — ABNORMAL LOW (ref 26.0–34.0)
MCHC: 28.6 g/dL — ABNORMAL LOW (ref 30.0–36.0)
MCV: 84 fL (ref 80.0–100.0)
Platelets: 172 10*3/uL (ref 150–400)
RBC: 3.87 MIL/uL — ABNORMAL LOW (ref 4.22–5.81)
RDW: 23.6 % — ABNORMAL HIGH (ref 11.5–15.5)
WBC: 6.7 10*3/uL (ref 4.0–10.5)
nRBC: 0 % (ref 0.0–0.2)

## 2019-06-08 NOTE — Progress Notes (Signed)
PROGRESS NOTE    Clarence LaosRobert M Maldonado  ZOX:[id number] DOB: 12-Sep-1946 DOA: 05/15/2019 PCP: Kermit Baloeed, Tiffany L, DO   Brief Narrative: Clarence LaosRobert M Maldonado is a 73 y.o. male with history of stroke, COPD, CKD stage III, tobacco use, diabetes mellitus type 2, hypertension. Patient presented secondary to weakness and frequent falls, found to have pulmonary TB.   Assessment & Plan:   Principal Problem:   Pulmonary tuberculosis with cavitation Active Problems:   Essential hypertension   CKD (chronic kidney disease) stage 3, GFR 30-59 ml/min (HCC)   Hyperlipidemia   Tobacco use disorder   Controlled type 2 diabetes mellitus with stage 3 chronic kidney disease, without long-term current use of insulin (HCC)   Weight loss   Hyperlipidemia associated with type 2 diabetes mellitus (HCC)   Anemia   Moderate protein-calorie malnutrition (HCC)   Rash   Pulmonary TB Started on RIPE therapy per ID. Repeat AFB cuture (9/1) pending. AFB smear positive. -Continue RIPE per ID recommendations  Dysphagia Evaluated by speech therapy. MBS significant for evidence of moderate oropharyngeal dysphagia with sensorimotor deficit and hypomotility of the pharynx. Patient deemed a high aspiration risk and placed on a dysphagia 1 diet. -Continue chloraseptic spray, dysphagia diet  Peripheral neuropathy Concern this could be related to RIPE therapy. Started on B6. Improved.  Heart mass Seen on left atrium. Per chart review, possibly congenital. Recommendation for outpatient cardiology follow-up  Non-sustained V-tach Stable. Hypokalemia and magnesium likely contributed.  Hypokalemia/Hypomagnesemia Resolved.  Iron deficiency anemia -Continue iron supplementation  Diabetes mellitus, type 2 -Continue SSI  Weight loss Nutrition/PT consulted  CKD stage III Stable   DVT prophylaxis: Lovenox Code Status:   Code Status: Full Code Family Communication: None Disposition Plan: Discharge pending safe discharge  plan   Consultants:   ID  Procedures:   None  Antimicrobials:  Rifampin, isoniazid, pyrazinamide, ethambutol   Subjective: Shortness of breath when walking. Feels okay otherwise. Mild cough. Patient states he is okay with plan to keep him since there is no safe discharge plan at this time.  Objective: Vitals:   06/07/19 2207 06/08/19 0528 06/08/19 0533 06/08/19 0827  BP: 135/75  126/69   Pulse: (!) 110  100   Resp: 18  18   Temp: 99.3 F (37.4 C)  98.3 F (36.8 C)   TempSrc: Oral  Oral   SpO2: 97%  97% 94%  Weight:  62.1 kg    Height:        Intake/Output Summary (Last 24 hours) at 06/08/2019 1529 Last data filed at 06/08/2019 1348 Gross per 24 hour  Intake 120 ml  Output 1500 ml  Net -1380 ml   Filed Weights   06/05/19 0630 06/06/19 0617 06/08/19 0528  Weight: 63 kg 62.1 kg 62.1 kg    Examination:  General exam: Appears calm and comfortable Respiratory system: Clear to auscultation. Respiratory effort normal. Cardiovascular system: S1 & S2 heard, RRR. No murmurs, rubs, gallops or clicks. Gastrointestinal system: Abdomen is nondistended, soft and nontender. No organomegaly or masses felt. Normal bowel sounds heard. Central nervous system: Alert. No focal neurological deficits. Extremities: No edema. No calf tenderness   Data Reviewed: I have personally reviewed following labs and imaging studies  CBC: Recent Labs  Lab 06/02/19 1631 06/08/19 0459  WBC 5.2 6.7  NEUTROABS 3.7  --   HGB 8.6* 9.3*  HCT 29.4* 32.5*  MCV 82.4 84.0  PLT 216 172   Basic Metabolic Panel: Recent Labs  Lab 06/08/19 0459  NA 136  K 4.7  CL 98  CO2 29  GLUCOSE 92  BUN 34*  CREATININE 1.44*  CALCIUM 8.6*   GFR: Estimated Creatinine Clearance: 40.7 mL/min (A) (by C-G formula based on SCr of 1.44 mg/dL (H)). Liver Function Tests: Recent Labs  Lab 06/07/19 0453  AST 18  ALT 13  ALKPHOS 83  BILITOT 0.3  PROT 6.3*  ALBUMIN 2.3*   No results for input(s): LIPASE,  AMYLASE in the last 168 hours. No results for input(s): AMMONIA in the last 168 hours. Coagulation Profile: No results for input(s): INR, PROTIME in the last 168 hours. Cardiac Enzymes: No results for input(s): CKTOTAL, CKMB, CKMBINDEX, TROPONINI in the last 168 hours. BNP (last 3 results) No results for input(s): PROBNP in the last 8760 hours. HbA1C: No results for input(s): HGBA1C in the last 72 hours. CBG: Recent Labs  Lab 06/03/19 0500 06/04/19 0537 06/05/19 0752 06/06/19 0612 06/07/19 0615  GLUCAP 121* 80 110* 138* 93   Lipid Profile: No results for input(s): CHOL, HDL, LDLCALC, TRIG, CHOLHDL, LDLDIRECT in the last 72 hours. Thyroid Function Tests: No results for input(s): TSH, T4TOTAL, FREET4, T3FREE, THYROIDAB in the last 72 hours. Anemia Panel: No results for input(s): VITAMINB12, FOLATE, FERRITIN, TIBC, IRON, RETICCTPCT in the last 72 hours. Sepsis Labs: No results for input(s): PROCALCITON, LATICACIDVEN in the last 168 hours.  Recent Results (from the past 240 hour(s))  Culture, blood (routine x 2)     Status: None   Collection Time: 06/01/19  6:11 AM   Specimen: BLOOD  Result Value Ref Range Status   Specimen Description   Final    BLOOD RIGHT ANTECUBITAL Performed at [place] [address] [place], [place] [address]    Special Requests   Final    BOTTLES DRAWN AEROBIC AND ANAEROBIC Blood Culture adequate volume Performed at [place] [address] [place], [place] [address]    Culture   Final    NO GROWTH 5 DAYS Performed at [facility], [place] [address], [place], [place] [address]    Report Status 06/06/2019 FINAL  Final  Culture, blood (routine x 2)     Status: None   Collection Time: 06/01/19  6:13 AM   Specimen: BLOOD RIGHT HAND  Result Value Ref Range Status   Specimen Description   Final    BLOOD RIGHT HAND Performed at [place] [address] [place], [place]  [address]    Special Requests   Final    BOTTLES DRAWN AEROBIC AND ANAEROBIC Blood Culture adequate volume Performed at [place] [address], [place], [place] [address]    Culture   Final    NO GROWTH 5 DAYS Performed at [facility], [place] [address], [place],  [address]    Report Status 06/06/2019 FINAL  Final  Acid Fast Smear (AFB)     Status: Abnormal   Collection Time: 06/06/19 12:44 PM   Specimen: Sputum  Result Value Ref Range Status   AFB Specimen Processing Concentration  Final   Acid Fast Smear Positive (A)  Final    Comment: (NOTE) 4+, more than 36 acid-fast bacilli per field at 400X magnification, fluorescent smear REPORTED RESULTS TO THOMAS L ON 06-07-19 AT 1718 FAX CONFORMATION TO [phone] Performed At: [facility] [place], [place] [id number] Rush Farmer MD YT:[id number]    Source (AFB) SPUTUM  Final    Comment: Performed at [facility], [address] [place], [place] [address]         Radiology Studies: No results found.      Scheduled Meds: . buPROPion  150 mg Oral Daily  . clotrimazole   Topical BID  . enoxaparin (LOVENOX) injection  40 mg Subcutaneous Q24H  . ethambutol  1,200 mg Oral Daily  . feeding supplement (ENSURE ENLIVE)  237 mL Oral Q24H  . ferrous sulfate  325 mg Oral BID WC  . ipratropium  2 puff Inhalation TID  . isoniazid  300 mg Oral Daily   And  . vitamin B-6  50 mg Oral Daily  . loratadine  10 mg Oral Q breakfast  . mouth rinse  15 mL Mouth Rinse BID  . mometasone-formoterol  2 puff Inhalation BID  . multivitamin with minerals  1 tablet Oral Daily  . pantoprazole  40 mg Oral Q0600  . pravastatin  20 mg Oral q1800  . pyrazinamide  1,500 mg Oral Daily  . rifampin  600 mg Oral Daily  . thiamine  100 mg Oral Daily   Continuous Infusions: . sodium chloride Stopped (05/23/19 0250)     LOS: 24 days     Jacquelin Hawking, MD [facility] 06/08/2019, 3:29 PM  If 7PM-7AM, please contact night-coverage [email or web address]

## 2019-06-08 NOTE — Progress Notes (Signed)
Patient ID: Clarence Maldonado, male   DOB: [birth year], 73 y.o.   MRN: [id number]          [facility]    Date of Admission:  05/15/2019   Day 21 RIPE         He is improving clinically.  He is feeling better.  He is afebrile and gaining weight on his sputum AFB smear remains 4+ positive.  I will continue his standard 4 drug TB regimen.         Michel Bickers, MD [facility] [phone] pager   [phone] cell 06/08/2019, 9:37 AM

## 2019-06-08 NOTE — Progress Notes (Signed)
SATURATION QUALIFICATIONS: (This note is used to comply with regulatory documentation for home oxygen)  Patient Saturations on Room Air at Rest =93 %  Patient Saturations on Room Air while Ambulating = 71%  Patient Saturations on 4 Liters of oxygen while Ambulating = 91%  Please briefly explain why patient needs home oxygen: patient desaturates with ambulation on RA and is DOE. Requires oxygen to prevent desaturation.   Clarence Maldonado, [place] 06/08/2019 11:56 AM

## 2019-06-08 NOTE — Progress Notes (Signed)
  Speech Language Pathology Treatment: Dysphagia  Patient Details Name: Clarence Maldonado MRN: [id number] DOB: 05-19-1946 Today's Date: 06/08/2019 Time: 1215-1258 SLP Time Calculation (min) (ACUTE ONLY): 43 min  Assessment / Plan / Recommendation Clinical Impression  PT continues to improve regarding swallow function. Subtle cough x1 with single bolus of solids today.  Consumed cheerios, oatmeal cream pie and soda.  He states "I don't like to eat without my teeth" but he managed all solids well.  Recommend advance diet and follow.  RN informed and pt agreeable and happy with plan.  Pt eating slowly thus did not require cues to masticate slowly or thoroughly.  Eating alone with improve his airway protection as he verbose.      HPI HPI: 73 year old male admitted 05/15/2019 with progressive weakness and cough, failure to thrive. Pt lives alone. PMH: cardiomegaly, CVA (2009), COPD, HTN, DM, obesity, CKD III, tobacco abuse. Lung sounds: some scattered rhonchi. Course rhonchus cough.  CXR = bilateral infiltrates. Chest CT = bilateral diffuse nodular infiltrates with cavitation and a large right upper lobe cystic lesion      SLP Plan  Continue with current plan of care       Recommendations  Diet recommendations: Dysphagia 3 (mechanical soft);Thin liquid Liquids provided via: Cup Medication Administration: Whole meds with puree Supervision: Patient able to self feed Compensations: Minimize environmental distractions;Slow rate;Small sips/bites                Oral Care Recommendations: Oral care BID SLP Visit Diagnosis: Dysphagia, oropharyngeal phase (R13.12) Plan: Continue with current plan of care       GO             Luanna Salk, [facility] SLP Acute Rehab Services Pager [phone] Office [phone]    [name] 06/08/2019, 1:05 PM

## 2019-06-08 NOTE — Progress Notes (Signed)
Physical Therapy Treatment Patient Details Name: Clarence Maldonado MRN: [id number] DOB: 03-07-1946 Today's Date: 06/08/2019    History of Present Illness 73 y.o. male with medical history significant of cardiomegaly, stroke, COPD,   CKD stage III, tobacco abuse, DM 2, HTN who presented to the emergency room on 8/10 with progressive weakness over the last 3 months to the point where he was falling over. He has lost approximately 43 pounds in the past 2 months. Pt found to have Pulmonary tuberculosis with cavitation    PT Comments    Pt initially required encouragement however grateful to be moving once ambulating.  Pt ambulated to/from window twice however requires rest breaks.  Pt remained on oxygen this session as RN reports pt's O2 dropped earlier with O2 sat screening.  Uncertain of d/c plans at this time as pt reports he can no longer return to his preadmission "home."    Follow Up Recommendations  SNF(uncertain due to continues (+) TB)     Equipment Recommendations  Rolling walker with 5" wheels(may need w/c depending on d/c setting)    Recommendations for Other Services       Precautions / Restrictions Precautions Precautions: Fall Precaution Comments: multiple falls, O2    Mobility  Bed Mobility Overal bed mobility: Needs Assistance Bed Mobility: Supine to Sit   Sidelying to sit: Supervision;HOB elevated          Transfers Overall transfer level: Needs assistance Equipment used: Rolling walker (2 wheeled) Transfers: Sit to/from Stand Sit to Stand: Min assist         General transfer comment: light steadying assistance; cues for UE placement  Ambulation/Gait Ambulation/Gait assistance: Min guard Gait Distance (Feet): 14 Feet(x4) Assistive device: Rolling walker (2 wheeled) Gait Pattern/deviations: Step-through pattern;Decreased stride length     General Gait Details: ambulated in room over to window and took standing rest break, then pt returned to sitting  on bed and took seated rest break; repeated this once more, remained on 3L O2 [place], cues for breathing in through nose (pt appears to be mouth breather)   Stairs             Wheelchair Mobility    Modified Rankin (Stroke Patients Only)       Balance                                            Cognition Arousal/Alertness: Awake/alert Behavior During Therapy: WFL for tasks assessed/performed Overall Cognitive Status: Within Functional Limits for tasks assessed                                 General Comments: pt states tough day; reports he cannot return to his "RV" now and appears overwhelmed with ability to pay for medical care      Exercises      General Comments        Pertinent Vitals/Pain Pain Assessment: No/denies pain    Home Living                      Prior Function            PT Goals (current goals can now be found in the care plan section) Progress towards PT goals: Progressing toward goals    Frequency  PT Plan Current plan remains appropriate    Co-evaluation              AM-PAC PT "6 Clicks" Mobility   Outcome Measure  Help needed turning from your back to your side while in a flat bed without using bedrails?: A Little Help needed moving from lying on your back to sitting on the side of a flat bed without using bedrails?: A Little Help needed moving to and from a bed to a chair (including a wheelchair)?: A Little Help needed standing up from a chair using your arms (e.g., wheelchair or bedside chair)?: A Little Help needed to walk in hospital room?: A Lot Help needed climbing 3-5 steps with a railing? : Total 6 Click Score: 15    End of Session Equipment Utilized During Treatment: Oxygen Activity Tolerance: Patient tolerated treatment well Patient left: in bed;with call bell/phone within reach;with bed alarm set(with MD)         Time: 1025-8527 PT Time Calculation  (min) (ACUTE ONLY): 30 min  Charges:  $Gait Training: 23-37 mins                    Carmelia Bake, PT, DPT [place] Office: [phone] Pager: [place] E 06/08/2019, 4:17 PM

## 2019-06-09 LAB — GLUCOSE, CAPILLARY: Glucose-Capillary: 104 mg/dL — ABNORMAL HIGH (ref 70–99)

## 2019-06-09 NOTE — Progress Notes (Signed)
Occupational Therapy Treatment Patient Details Name: Clarence Maldonado MRN: [id number] DOB: Nov 04, 1945 Today's Date: 06/09/2019    History of present illness 73 y.o. male with medical history significant of cardiomegaly, stroke, COPD,   CKD stage III, tobacco abuse, DM 2, HTN who presented to the emergency room on 8/10 with progressive weakness over the last 3 months to the point where he was falling over. He has lost approximately 43 pounds in the past 2 months. Pt found to have Pulmonary tuberculosis with cavitation   OT comments  Pt able to get up to chair with min guard for safety. Performed UB adl then up for lunch.  Pt initially had 02 off when OT arrived; replaced.  He was comfortable in chair and verbalized use of call bell when he is ready to get up from chair   Follow Up Recommendations  SNF;Home health OT;Follow surgeon's recommendation for DC plan and follow-up therapies    Equipment Recommendations  3 in 1 bedside commode    Recommendations for Other Services      Precautions / Restrictions Precautions Precautions: Fall Precaution Comments: multiple falls, O2 Restrictions Weight Bearing Restrictions: No       Mobility Bed Mobility         Supine to sit: Supervision        Transfers   Equipment used: Rolling walker (2 wheeled)   Sit to Stand: Min guard Stand pivot transfers: Min guard       General transfer comment: did not need to physically assist pt today; min guard for safety    Balance     Sitting balance-Leahy Scale: Good       Standing balance-Leahy Scale: Poor                             ADL either performed or assessed with clinical judgement   ADL Overall ADL's : Needs assistance/impaired Eating/Feeding: Set up Eating/Feeding Details (indicate cue type and reason): assist to open milk Grooming: Set up;Sitting   Upper Body Bathing: Set up;Sitting       Upper Body Dressing : Minimal assistance Upper Body Dressing  Details (indicate cue type and reason): tie gown     Toilet Transfer: Min guard;Stand-pivot;RW(chair)             General ADL Comments: performed UB adl; pt wanted to get up to chair to eat     Vision       Perception     Praxis      Cognition Arousal/Alertness: Awake/alert Behavior During Therapy: WFL for tasks assessed/performed Overall Cognitive Status: Within Functional Limits for tasks assessed                                          Exercises     Shoulder Instructions       General Comments continues to get SOB with standing activity/transfer; dyspnea 2-3/4.  Pt had removed 02 prior to OT's arrival.  sats 89%.  Replaced prior to mobilizing/performing adls    Pertinent Vitals/ Pain       Pain Assessment: No/denies pain  Home Living  Prior Functioning/Environment              Frequency  Min 2X/week        Progress Toward Goals  OT Goals(current goals can now be found in the care plan section)  Progress towards OT goals: Progressing toward goals     Plan      Co-evaluation                 AM-PAC OT "6 Clicks" Daily Activity     Outcome Measure   Help from another person eating meals?: None Help from another person taking care of personal grooming?: A Little Help from another person toileting, which includes using toliet, bedpan, or urinal?: A Little Help from another person bathing (including washing, rinsing, drying)?: A Little Help from another person to put on and taking off regular upper body clothing?: A Little Help from another person to put on and taking off regular lower body clothing?: A Little 6 Click Score: 19    End of Session    OT Visit Diagnosis: Unsteadiness on feet (R26.81)   Activity Tolerance Patient tolerated treatment well   Patient Left in chair;with call bell/phone within reach;with chair alarm set(pillow under buttocks for  comfort)   Nurse Communication          Time: 7672-0947 OT Time Calculation (min): 27 min  Charges: OT General Charges $OT Visit: 1 Visit OT Treatments $Self Care/Home Management : 23-37 mins  [name], OTR/L Acute Rehabilitation Services [phone] [facility] pager [phone] office 06/09/2019   Clarence Maldonado 06/09/2019, 2:01 PM

## 2019-06-09 NOTE — Progress Notes (Signed)
PROGRESS NOTE    Clarence Maldonado  QIW:[id number] DOB: January 22, 1946 DOA: 05/15/2019 PCP: Gayland Curry, DO   Brief Narrative: Clarence Maldonado is a 73 y.o. male with history of stroke, COPD, CKD stage III, tobacco use, diabetes mellitus type 2, hypertension. Patient presented secondary to weakness and frequent falls, found to have pulmonary TB.   Assessment & Plan:   Principal Problem:   Pulmonary tuberculosis with cavitation Active Problems:   Essential hypertension   CKD (chronic kidney disease) stage 3, GFR 30-59 ml/min (HCC)   Hyperlipidemia   Tobacco use disorder   Controlled type 2 diabetes mellitus with stage 3 chronic kidney disease, without long-term current use of insulin (HCC)   Weight loss   Hyperlipidemia associated with type 2 diabetes mellitus (HCC)   Anemia   Moderate protein-calorie malnutrition (HCC)   Rash   Pulmonary TB Started on RIPE therapy per ID. Repeat AFB cuture (9/1) pending. AFB smear positive. -Continue RIPE per ID recommendations  Dysphagia Evaluated by speech therapy. MBS significant for evidence of moderate oropharyngeal dysphagia with sensorimotor deficit and hypomotility of the pharynx. Patient deemed a high aspiration risk and placed on a dysphagia 1 diet. -Continue chloraseptic spray, dysphagia diet  Peripheral neuropathy Concern this could be related to RIPE therapy. Started on B6. Improved.  Heart mass Seen on left atrium. Per chart review, possibly congenital. Recommendation for outpatient cardiology follow-up  Non-sustained V-tach Stable. Hypokalemia and magnesium likely contributed.  Hypokalemia/Hypomagnesemia Resolved.  Iron deficiency anemia -Continue iron supplementation  Diabetes mellitus, type 2 -Continue SSI  Weight loss Nutrition/PT consulted  CKD stage III Stable   DVT prophylaxis: Lovenox Code Status:   Code Status: Full Code Family Communication: None Disposition Plan: Discharge pending safe discharge  plan   Consultants:   ID  Procedures:   None  Antimicrobials:  Rifampin, isoniazid, pyrazinamide, ethambutol   Subjective: Patient feels much better since moving two doors down to a new room. He is enjoying his food at the time of interview.  Objective: Vitals:   06/08/19 0827 06/08/19 1658 06/08/19 2010 06/09/19 0541  BP:  131/75 (!) 147/88 (!) 133/92  Pulse:  (!) 114 (!) 110 (!) 109  Resp:  18 20 16   Temp:  98.7 F (37.1 C) 98.1 F (36.7 C) 98 F (36.7 C)  TempSrc:  Oral Oral Oral  SpO2: 94% 98% 97% 92%  Weight:    62.5 kg  Height:        Intake/Output Summary (Last 24 hours) at 06/09/2019 1608 Last data filed at 06/09/2019 0925 Gross per 24 hour  Intake 490 ml  Output 575 ml  Net -85 ml   Filed Weights   06/06/19 0617 06/08/19 0528 06/09/19 0541  Weight: 62.1 kg 62.1 kg 62.5 kg    Examination:  General: Well appearing, no distress Respiratory: no distress   Data Reviewed: I have personally reviewed following labs and imaging studies  CBC: Recent Labs  Lab 06/02/19 1631 06/08/19 0459  WBC 5.2 6.7  NEUTROABS 3.7  --   HGB 8.6* 9.3*  HCT 29.4* 32.5*  MCV 82.4 84.0  PLT 216 417   Basic Metabolic Panel: Recent Labs  Lab 06/08/19 0459  NA 136  K 4.7  CL 98  CO2 29  GLUCOSE 92  BUN 34*  CREATININE 1.44*  CALCIUM 8.6*   GFR: Estimated Creatinine Clearance: 41 mL/min (A) (by C-G formula based on SCr of 1.44 mg/dL (H)). Liver Function Tests: Recent Labs  Lab 06/07/19 [phone]  AST 18  ALT 13  ALKPHOS 83  BILITOT 0.3  PROT 6.3*  ALBUMIN 2.3*   No results for input(s): LIPASE, AMYLASE in the last 168 hours. No results for input(s): AMMONIA in the last 168 hours. Coagulation Profile: No results for input(s): INR, PROTIME in the last 168 hours. Cardiac Enzymes: No results for input(s): CKTOTAL, CKMB, CKMBINDEX, TROPONINI in the last 168 hours. BNP (last 3 results) No results for input(s): PROBNP in the last 8760 hours. HbA1C: No  results for input(s): HGBA1C in the last 72 hours. CBG: Recent Labs  Lab 06/04/19 0537 06/05/19 0752 06/06/19 0612 06/07/19 0615 06/09/19 0538  GLUCAP 80 110* 138* 93 104*   Lipid Profile: No results for input(s): CHOL, HDL, LDLCALC, TRIG, CHOLHDL, LDLDIRECT in the last 72 hours. Thyroid Function Tests: No results for input(s): TSH, T4TOTAL, FREET4, T3FREE, THYROIDAB in the last 72 hours. Anemia Panel: No results for input(s): VITAMINB12, FOLATE, FERRITIN, TIBC, IRON, RETICCTPCT in the last 72 hours. Sepsis Labs: No results for input(s): PROCALCITON, LATICACIDVEN in the last 168 hours.  Recent Results (from the past 240 hour(s))  Culture, blood (routine x 2)     Status: None   Collection Time: 06/01/19  6:11 AM   Specimen: BLOOD  Result Value Ref Range Status   Specimen Description   Final    BLOOD RIGHT ANTECUBITAL Performed at [facility], [address] [place], [place] [address]    Special Requests   Final    BOTTLES DRAWN AEROBIC AND ANAEROBIC Blood Culture adequate volume Performed at [facility], [address], [place], [place] [address]    Culture   Final    NO GROWTH 5 DAYS Performed at [facility], [address], [place], [place] [address]    Report Status 06/06/2019 FINAL  Final  Culture, blood (routine x 2)     Status: None   Collection Time: 06/01/19  6:13 AM   Specimen: BLOOD RIGHT HAND  Result Value Ref Range Status   Specimen Description   Final    BLOOD RIGHT HAND Performed at [facility], [address], [place], [place] [address]    Special Requests   Final    BOTTLES DRAWN AEROBIC AND ANAEROBIC Blood Culture adequate volume Performed at [facility], [address] [place], [place] [address]    Culture   Final    NO GROWTH 5 DAYS Performed at [facility], [address], [place], [place] [address]    Report Status 06/06/2019 FINAL  Final  Acid  Fast Smear (AFB)     Status: Abnormal   Collection Time: 06/06/19 12:44 PM   Specimen: Sputum  Result Value Ref Range Status   AFB Specimen Processing Concentration  Final   Acid Fast Smear Positive (A)  Final    Comment: (NOTE) 4+, more than 36 acid-fast bacilli per field at 400X magnification, fluorescent smear REPORTED RESULTS TO THOMAS L ON 06-07-19 AT 1718 FAX CONFORMATION TO [phone] Performed At: [facility] [address] [place], [place] [id number] Jolene Schimke MD BS:[id number]    Source (AFB) SPUTUM  Final    Comment: Performed at [facility], [address], [place], [place] [address]         Radiology Studies: No results found.      Scheduled Meds: . buPROPion  150 mg Oral Daily  . clotrimazole   Topical BID  . enoxaparin (LOVENOX) injection  40  mg Subcutaneous Q24H  . ethambutol  1,200 mg Oral Daily  . feeding supplement (ENSURE ENLIVE)  237 mL Oral Q24H  . ferrous sulfate  325 mg Oral BID WC  . ipratropium  2 puff Inhalation TID  . isoniazid  300 mg Oral Daily   And  . vitamin B-6  50 mg Oral Daily  . loratadine  10 mg Oral Q breakfast  . mouth rinse  15 mL Mouth Rinse BID  . mometasone-formoterol  2 puff Inhalation BID  . multivitamin with minerals  1 tablet Oral Daily  . pantoprazole  40 mg Oral Q0600  . pravastatin  20 mg Oral q1800  . pyrazinamide  1,500 mg Oral Daily  . rifampin  600 mg Oral Daily  . thiamine  100 mg Oral Daily   Continuous Infusions: . sodium chloride Stopped (05/23/19 0250)     LOS: 25 days     Jacquelin Hawkingalph , MD [facility] 06/09/2019, 4:08 PM  If 7PM-7AM, please contact night-coverage [email or web address]

## 2019-06-09 NOTE — TOC Progression Note (Signed)
Transition of Care [facility]) - Progression Note    Patient Details  Name: SUMNER KIRCHMAN MRN: [id number] Date of Birth: [birth year]  [facility] CM/SW Contact  Purcell Mouton, RN Phone Number: 06/09/2019, 11:17 AM  Clinical Narrative:    This CM reached out to SNF x2,  ID MD, attending and Director on floor concerning pt. SNF will not offer a bed to pt related to him being a new TB pt and will not accept if he was negative. Because it would put other pt's at risk. Pt was notice not to be moving around in his room and staying in bed by MD. The recommendations from ID MD, PT is to have pt not in bed so long, OOB moving in his room. Once pt is stronger he may discharge home. Pt lives in a Youth worker with running water from a water hose, lights, and Propane heat. Pt states that this is the best for him because he is a single man. FYI A [employer] Camper is attached to a water hose for running water. Has a acceptable running water, heat and septic tank.    Expected Discharge Plan: [place] Barriers to Discharge: Continued Medical Work up  Expected Discharge Plan and Services Expected Discharge Plan: [place]   Discharge Planning Services: CM Consult   Living arrangements for the past 2 months: Mobile Home                                       Social Determinants of Health (SDOH) Interventions    Readmission Risk Interventions Readmission Risk Prevention Plan 05/23/2019  Transportation Screening Complete  PCP or Specialist Appt within 3-5 Days Not Complete  Not Complete comments Not ready for dc  HRI or [place] Complete  Social Work Consult for [place] Planning/Counseling Not Complete  SW consult not completed comments nA  Palliative Care Screening Not Applicable  Medication Review Press photographer) Complete  Some recent data might be hidden

## 2019-06-09 NOTE — Progress Notes (Signed)
Patient ID: Clarence Maldonado, male   DOB: Jan 08, 1946, 73 y.o.   MRN: [id number]         [facility] for Infectious Disease  Date of Admission:  05/15/2019           Day 22 4 drug TB therapy (RIPE) ASSESSMENT: He is improving slowly on standard 4 drug therapy for cavitary TB pneumonia.    PLAN: 1. Continue RIPE 2. Await results of repeat AFB culture 3. Please call Dr. Carlyle Basques [phone]) for any infectious disease questions this weekend  Principal Problem:   Pulmonary tuberculosis with cavitation Active Problems:   Rash   Essential hypertension   CKD (chronic kidney disease) stage 3, GFR 30-59 ml/min (HCC)   Hyperlipidemia   Tobacco use disorder   Controlled type 2 diabetes mellitus with stage 3 chronic kidney disease, without long-term current use of insulin (HCC)   Weight loss   Hyperlipidemia associated with type 2 diabetes mellitus (HCC)   Anemia   Moderate protein-calorie malnutrition (HCC)   Scheduled Meds: . buPROPion  150 mg Oral Daily  . clotrimazole   Topical BID  . enoxaparin (LOVENOX) injection  40 mg Subcutaneous Q24H  . ethambutol  1,200 mg Oral Daily  . feeding supplement (ENSURE ENLIVE)  237 mL Oral Q24H  . ferrous sulfate  325 mg Oral BID WC  . ipratropium  2 puff Inhalation TID  . isoniazid  300 mg Oral Daily   And  . vitamin B-6  50 mg Oral Daily  . loratadine  10 mg Oral Q breakfast  . mouth rinse  15 mL Mouth Rinse BID  . mometasone-formoterol  2 puff Inhalation BID  . multivitamin with minerals  1 tablet Oral Daily  . pantoprazole  40 mg Oral Q0600  . pravastatin  20 mg Oral q1800  . pyrazinamide  1,500 mg Oral Daily  . rifampin  600 mg Oral Daily  . thiamine  100 mg Oral Daily   Continuous Infusions: . sodium chloride Stopped (05/23/19 0250)   PRN Meds:.sodium chloride, acetaminophen **OR** acetaminophen, alum & mag hydroxide-simeth, dextrose, HYDROcodone-acetaminophen, hydrocortisone cream, levalbuterol, loperamide, magic  mouthwash w/lidocaine, ondansetron **OR** ondansetron (ZOFRAN) IV, phenol, Resource ThickenUp Clear, zolpidem   SUBJECTIVE: He says that he is eating everything his food tray.  His cough is much improved.  He still gets short of breath when up walking.  He did walk in his room today with PT.  Review of Systems: Review of Systems  Constitutional: Positive for malaise/fatigue and weight loss. Negative for chills, diaphoresis and fever.  Respiratory: Positive for cough, sputum production and shortness of breath. Negative for hemoptysis.   Cardiovascular: Negative for chest pain.  Gastrointestinal: Negative for abdominal pain, diarrhea, nausea and vomiting.    No Known Allergies  OBJECTIVE: Vitals:   06/08/19 0827 06/08/19 1658 06/08/19 2010 06/09/19 0541  BP:  131/75 (!) 147/88 (!) 133/92  Pulse:  (!) 114 (!) 110 (!) 109  Resp:  18 20 16   Temp:  98.7 F (37.1 C) 98.1 F (36.7 C) 98 F (36.7 C)  TempSrc:  Oral Oral Oral  SpO2: 94% 98% 97% 92%  Weight:    62.5 kg  Height:       Body mass index is 22.23 kg/m.  Physical Exam Constitutional:      Comments: He is talkative and in good spirits.    Cardiovascular:     Rate and Rhythm: Normal rate and regular rhythm.     Heart sounds:  No murmur.  Pulmonary:     Effort: Pulmonary effort is normal.     Breath sounds: Rhonchi present. No wheezing or rales.  Abdominal:     Palpations: Abdomen is soft.     Tenderness: There is no abdominal tenderness.  Skin:    Findings: No erythema.     Comments: His rash has resolved.  Psychiatric:        Mood and Affect: Mood normal.     Lab Results Lab Results  Component Value Date   WBC 6.7 06/08/2019   HGB 9.3 (L) 06/08/2019   HCT 32.5 (L) 06/08/2019   MCV 84.0 06/08/2019   PLT 172 06/08/2019    Lab Results  Component Value Date   CREATININE 1.44 (H) 06/08/2019   BUN 34 (H) 06/08/2019   NA 136 06/08/2019   K 4.7 06/08/2019   CL 98 06/08/2019   CO2 29 06/08/2019    Lab  Results  Component Value Date   ALT 13 06/07/2019   AST 18 06/07/2019   ALKPHOS 83 06/07/2019   BILITOT 0.3 06/07/2019     Microbiology: Recent Results (from the past 240 hour(s))  Culture, blood (routine x 2)     Status: None   Collection Time: 06/01/19  6:11 AM   Specimen: BLOOD  Result Value Ref Range Status   Specimen Description   Final    BLOOD RIGHT ANTECUBITAL Performed at [facility], [address], [place], [place] [address]    Special Requests   Final    BOTTLES DRAWN AEROBIC AND ANAEROBIC Blood Culture adequate volume Performed at [facility], [address] [place], [place] [address]    Culture   Final    NO GROWTH 5 DAYS Performed at [facility], [address] [place], [place] [address]    Report Status 06/06/2019 FINAL  Final  Culture, blood (routine x 2)     Status: None   Collection Time: 06/01/19  6:13 AM   Specimen: BLOOD RIGHT HAND  Result Value Ref Range Status   Specimen Description   Final    BLOOD RIGHT HAND Performed at [facility], [address], [place], [place] [address]    Special Requests   Final    BOTTLES DRAWN AEROBIC AND ANAEROBIC Blood Culture adequate volume Performed at [facility], [address], [place], [place] [address]    Culture   Final    NO GROWTH 5 DAYS Performed at [facility], [address], [place], [place] [address]    Report Status 06/06/2019 FINAL  Final  Acid Fast Smear (AFB)     Status: Abnormal   Collection Time: 06/06/19 12:44 PM   Specimen: Sputum  Result Value Ref Range Status   AFB Specimen Processing Concentration  Final   Acid Fast Smear Positive (A)  Final    Comment: (NOTE) 4+, more than 36 acid-fast bacilli per field at 400X magnification, fluorescent smear REPORTED RESULTS TO THOMAS L ON 06-07-19 AT 1718 FAX CONFORMATION TO [phone] Performed At: [facility] [address]  [place], [place] [id number] Jolene Schimke MD QZ:[id number]    Source (AFB) SPUTUM  Final    Comment: Performed at [facility], [address], [place], [place] [address]    Cliffton Asters, MD [facility] for Infectious Disease [facility] [phone] pager   [phone] cell 06/09/2019, 9:54 AM

## 2019-06-10 LAB — GLUCOSE, CAPILLARY: Glucose-Capillary: 91 mg/dL (ref 70–99)

## 2019-06-10 NOTE — Progress Notes (Signed)
PROGRESS NOTE    Clarence Maldonado  MCE:[id number] DOB: 1945/11/03 DOA: 05/15/2019 PCP: [name], DO   Brief Narrative: Clarence Maldonado is a 73 y.o. male with history of stroke, COPD, CKD stage III, tobacco use, diabetes mellitus type 2, hypertension. Patient presented secondary to weakness and frequent falls, found to have pulmonary TB.   Assessment & Plan:   Principal Problem:   Pulmonary tuberculosis with cavitation Active Problems:   Essential hypertension   CKD (chronic kidney disease) stage 3, GFR 30-59 ml/min (HCC)   Hyperlipidemia   Tobacco use disorder   Controlled type 2 diabetes mellitus with stage 3 chronic kidney disease, without long-term current use of insulin (HCC)   Weight loss   Hyperlipidemia associated with type 2 diabetes mellitus (HCC)   Anemia   Moderate protein-calorie malnutrition (HCC)   Rash   Pulmonary TB Started on RIPE therapy per ID. Repeat AFB cuture (9/1) pending. AFB smear positive. -Continue RIPE per ID recommendations  Dysphagia Evaluated by speech therapy. MBS significant for evidence of moderate oropharyngeal dysphagia with sensorimotor deficit and hypomotility of the pharynx. Patient deemed a high aspiration risk and placed on a dysphagia 1 diet. -Continue chloraseptic spray, dysphagia diet  Peripheral neuropathy Concern this could be related to RIPE therapy. Started on B6. Improved.  Heart mass Seen on left atrium. Per chart review, possibly congenital. Recommendation for outpatient cardiology follow-up  Non-sustained V-tach Stable. Hypokalemia and magnesium likely contributed.  Hypokalemia/Hypomagnesemia Resolved.  Iron deficiency anemia -Continue iron supplementation  Diabetes mellitus, type 2 -Continue SSI  Weight loss Nutrition/PT consulted  CKD stage III Stable   DVT prophylaxis: Lovenox Code Status:   Code Status: Full Code Family Communication: None Disposition Plan: Discharge pending safe discharge  plan   Consultants:   ID  Procedures:   None  Antimicrobials:  Rifampin, isoniazid, pyrazinamide, ethambutol   Subjective: Patient feels well at rest. He has worsening dyspnea when exerting himself.   Objective: Vitals:   06/10/19 0751 06/10/19 1051 06/10/19 1052 06/10/19 1303  BP:    135/80  Pulse:    98  Resp:    19  Temp:    98.5 F (36.9 C)  TempSrc:    Oral  SpO2: 93% (!) 86% 96% 97%  Weight:      Height:        Intake/Output Summary (Last 24 hours) at 06/10/2019 1425 Last data filed at 06/10/2019 1338 Gross per 24 hour  Intake 600 ml  Output 1400 ml  Net -800 ml   Filed Weights   06/08/19 0528 06/09/19 0541 06/10/19 0604  Weight: 62.1 kg 62.5 kg 62.4 kg    Examination:  General: Well appearing, no distress Respiratory: Unlabored work of breathing. Neuro: alert, oriented   Data Reviewed: I have personally reviewed following labs and imaging studies  CBC: Recent Labs  Lab 06/08/19 0459  WBC 6.7  HGB 9.3*  HCT 32.5*  MCV 84.0  PLT 172   Basic Metabolic Panel: Recent Labs  Lab 06/08/19 0459  NA 136  K 4.7  CL 98  CO2 29  GLUCOSE 92  BUN 34*  CREATININE 1.44*  CALCIUM 8.6*   GFR: Estimated Creatinine Clearance: 40.9 mL/min (A) (by C-G formula based on SCr of 1.44 mg/dL (H)). Liver Function Tests: Recent Labs  Lab 06/07/19 0453  AST 18  ALT 13  ALKPHOS 83  BILITOT 0.3  PROT 6.3*  ALBUMIN 2.3*   No results for input(s): LIPASE, AMYLASE in the last 168  hours. No results for input(s): AMMONIA in the last 168 hours. Coagulation Profile: No results for input(s): INR, PROTIME in the last 168 hours. Cardiac Enzymes: No results for input(s): CKTOTAL, CKMB, CKMBINDEX, TROPONINI in the last 168 hours. BNP (last 3 results) No results for input(s): PROBNP in the last 8760 hours. HbA1C: No results for input(s): HGBA1C in the last 72 hours. CBG: Recent Labs  Lab 06/05/19 0752 06/06/19 0612 06/07/19 0615 06/09/19 0538 06/10/19  0530  GLUCAP 110* 138* 93 104* 91   Lipid Profile: No results for input(s): CHOL, HDL, LDLCALC, TRIG, CHOLHDL, LDLDIRECT in the last 72 hours. Thyroid Function Tests: No results for input(s): TSH, T4TOTAL, FREET4, T3FREE, THYROIDAB in the last 72 hours. Anemia Panel: No results for input(s): VITAMINB12, FOLATE, FERRITIN, TIBC, IRON, RETICCTPCT in the last 72 hours. Sepsis Labs: No results for input(s): PROCALCITON, LATICACIDVEN in the last 168 hours.  Recent Results (from the past 240 hour(s))  Culture, blood (routine x 2)     Status: None   Collection Time: 06/01/19  6:11 AM   Specimen: BLOOD  Result Value Ref Range Status   Specimen Description   Final    BLOOD RIGHT ANTECUBITAL Performed at [facility], [address] [place], [place] [address]    Special Requests   Final    BOTTLES DRAWN AEROBIC AND ANAEROBIC Blood Culture adequate volume Performed at [facility], [address] [place], [place] [address]    Culture   Final    NO GROWTH 5 DAYS Performed at [name], [address] [place], [place] [address]    Report Status 06/06/2019 FINAL  Final  Culture, blood (routine x 2)     Status: None   Collection Time: 06/01/19  6:13 AM   Specimen: BLOOD RIGHT HAND  Result Value Ref Range Status   Specimen Description   Final    BLOOD RIGHT HAND Performed at [facility], [address] [place], [place] [address]    Special Requests   Final    BOTTLES DRAWN AEROBIC AND ANAEROBIC Blood Culture adequate volume Performed at [facility], [address] [place], [place] [address]    Culture   Final    NO GROWTH 5 DAYS Performed at [facility], [address] [place], [place] [address]    Report Status 06/06/2019 FINAL  Final  Acid Fast Smear (AFB)     Status: Abnormal   Collection Time: 06/06/19 12:44 PM   Specimen: Sputum  Result Value Ref Range Status   AFB Specimen  Processing Concentration  Final   Acid Fast Smear Positive (A)  Final    Comment: (NOTE) 4+, more than 36 acid-fast bacilli per field at 400X magnification, fluorescent smear REPORTED RESULTS TO THOMAS L ON 06-07-19 AT 1718 FAX CONFORMATION TO [phone] Performed At: [facility] [address] [place], [place] [id number] Jolene SchimkeNagendra Sanjai MD NU:[phone]    Source (AFB) SPUTUM  Final    Comment: Performed at [facility], [address] [place], [place] [address]         Radiology Studies: No results found.      Scheduled Meds: . buPROPion  150 mg Oral Daily  . clotrimazole   Topical BID  . enoxaparin (LOVENOX) injection  40 mg Subcutaneous Q24H  . ethambutol  1,200 mg Oral Daily  . feeding supplement (ENSURE ENLIVE)  237 mL Oral Q24H  . ferrous sulfate  325 mg  Oral BID WC  . ipratropium  2 puff Inhalation TID  . isoniazid  300 mg Oral Daily   And  . vitamin B-6  50 mg Oral Daily  . loratadine  10 mg Oral Q breakfast  . mouth rinse  15 mL Mouth Rinse BID  . mometasone-formoterol  2 puff Inhalation BID  . multivitamin with minerals  1 tablet Oral Daily  . pantoprazole  40 mg Oral Q0600  . pravastatin  20 mg Oral q1800  . pyrazinamide  1,500 mg Oral Daily  . rifampin  600 mg Oral Daily  . thiamine  100 mg Oral Daily   Continuous Infusions: . sodium chloride Stopped (05/23/19 0250)     LOS: 26 days     Cordelia Poche, MD [facility] 06/10/2019, 2:25 PM  If 7PM-7AM, please contact night-coverage [email or web address]

## 2019-06-10 NOTE — TOC Progression Note (Signed)
Transition of Care [facility]) - Progression Note    Patient Details  Name: HYDE SIRES MRN: [id number] Date of Birth: 1946-08-18  Transition of Care [facility]) CM/SW Contact  Joaquin Courts, RN Phone Number: 06/10/2019, 1:44 PM  Clinical Narrative:    CM spoke with [employer] rep Keon who reports difficulty with getting in tough with a family member to be let into home for O2 delivery.  CM confirmed patient has a place to plug in concentrator, additionally patient's emergency contact Jerlyn Ly can let DME company into camper to set up O2. Mr Melony Overly number was provided to [employer] rep Keon.    Expected Discharge Plan: [place] Barriers to Discharge: Continued Medical Work up  Expected Discharge Plan and Services Expected Discharge Plan: [place]   Discharge Planning Services: CM Consult   Living arrangements for the past 2 months: Mobile Home                                       Social Determinants of Health (SDOH) Interventions    Readmission Risk Interventions Readmission Risk Prevention Plan 05/23/2019  Transportation Screening Complete  PCP or Specialist Appt within 3-5 Days Not Complete  Not Complete comments Not ready for dc  HRI or [place] Complete  Social Work Consult for [place] Planning/Counseling Not Complete  SW consult not completed comments nA  Palliative Care Screening Not Applicable  Medication Review [employer]) Complete  Some recent data might be hidden

## 2019-06-11 LAB — CBC
HCT: 31.1 % — ABNORMAL LOW (ref 39.0–52.0)
Hemoglobin: 9 g/dL — ABNORMAL LOW (ref 13.0–17.0)
MCH: 24.2 pg — ABNORMAL LOW (ref 26.0–34.0)
MCHC: 28.9 g/dL — ABNORMAL LOW (ref 30.0–36.0)
MCV: 83.6 fL (ref 80.0–100.0)
Platelets: 230 10*3/uL (ref 150–400)
RBC: 3.72 MIL/uL — ABNORMAL LOW (ref 4.22–5.81)
RDW: 24.2 % — ABNORMAL HIGH (ref 11.5–15.5)
WBC: 6.1 10*3/uL (ref 4.0–10.5)
nRBC: 0 % (ref 0.0–0.2)

## 2019-06-11 LAB — BASIC METABOLIC PANEL
Anion gap: 12 (ref 5–15)
BUN: 36 mg/dL — ABNORMAL HIGH (ref 8–23)
CO2: 28 mmol/L (ref 22–32)
Calcium: 8.8 mg/dL — ABNORMAL LOW (ref 8.9–10.3)
Chloride: 96 mmol/L — ABNORMAL LOW (ref 98–111)
Creatinine, Ser: 1.46 mg/dL — ABNORMAL HIGH (ref 0.61–1.24)
GFR calc Af Amer: 55 mL/min — ABNORMAL LOW (ref >60)
GFR calc non Af Amer: 47 mL/min — ABNORMAL LOW (ref >60)
Glucose, Bld: 95 mg/dL (ref 70–99)
Potassium: 3.9 mmol/L (ref 3.5–5.1)
Sodium: 136 mmol/L (ref 135–145)

## 2019-06-11 LAB — GLUCOSE, CAPILLARY: Glucose-Capillary: 95 mg/dL (ref 70–99)

## 2019-06-11 NOTE — Progress Notes (Signed)
PROGRESS NOTE    Clarence Maldonado  VZC:[id number] DOB: 11/03/1945 DOA: 05/15/2019 PCP: [name], DO   Brief Narrative: Clarence Maldonado is a 73 y.o. male with history of stroke, COPD, CKD stage III, tobacco use, diabetes mellitus type 2, hypertension. Patient presented secondary to weakness and frequent falls, found to have pulmonary TB.   Assessment & Plan:   Principal Problem:   Pulmonary tuberculosis with cavitation Active Problems:   Essential hypertension   CKD (chronic kidney disease) stage 3, GFR 30-59 ml/min (HCC)   Hyperlipidemia   Tobacco use disorder   Controlled type 2 diabetes mellitus with stage 3 chronic kidney disease, without long-term current use of insulin (HCC)   Weight loss   Hyperlipidemia associated with type 2 diabetes mellitus (HCC)   Anemia   Moderate protein-calorie malnutrition (HCC)   Rash   Pulmonary TB Started on RIPE therapy per ID. Repeat AFB cuture (9/1) pending. AFB smear positive. -Continue RIPE per ID recommendations  Dysphagia Evaluated by speech therapy. MBS significant for evidence of moderate oropharyngeal dysphagia with sensorimotor deficit and hypomotility of the pharynx. Patient deemed a high aspiration risk and placed on a dysphagia 1 diet. -Continue chloraseptic spray, dysphagia diet  Peripheral neuropathy Concern this could be related to RIPE therapy. Started on B6. Improved.  Heart mass Seen on left atrium. Per chart review, possibly congenital. Recommendation for outpatient cardiology follow-up  Non-sustained V-tach Stable. Hypokalemia and magnesium likely contributed.  Hypokalemia/Hypomagnesemia Resolved.  Iron deficiency anemia -Continue iron supplementation  Diabetes mellitus, type 2 -Continue SSI  Weight loss Nutrition/PT consulted  CKD stage III Stable  No changes   DVT prophylaxis: Lovenox Code Status:   Code Status: Full Code Family Communication: None Disposition Plan: Discharge pending  safe discharge plan   Consultants:   ID  Procedures:   None  Antimicrobials:  Rifampin, isoniazid, pyrazinamide, ethambutol   Subjective: Feels great at rest. Some shortness of breath still with ambulation  Objective: Vitals:   06/10/19 2022 06/11/19 0436 06/11/19 0440 06/11/19 0755  BP: 140/87 136/84    Pulse: (!) 103 (!) 106    Resp: 18 18    Temp: 98 F (36.7 C) 97.7 F (36.5 C)    TempSrc: Oral Oral    SpO2: 100% 94%  95%  Weight:   61.8 kg   Height:        Intake/Output Summary (Last 24 hours) at 06/11/2019 1322 Last data filed at 06/11/2019 1021 Gross per 24 hour  Intake 480 ml  Output 1750 ml  Net -1270 ml   Filed Weights   06/09/19 0541 06/10/19 0604 06/11/19 0440  Weight: 62.5 kg 62.4 kg 61.8 kg    Examination:  General: Well appearing, no distress Respiratory: normal work of breathing   Data Reviewed: I have personally reviewed following labs and imaging studies  CBC: Recent Labs  Lab 06/08/19 0459 06/11/19 0535  WBC 6.7 6.1  HGB 9.3* 9.0*  HCT 32.5* 31.1*  MCV 84.0 83.6  PLT 172 230   Basic Metabolic Panel: Recent Labs  Lab 06/08/19 0459 06/11/19 0535  NA 136 136  K 4.7 3.9  CL 98 96*  CO2 29 28  GLUCOSE 92 95  BUN 34* 36*  CREATININE 1.44* 1.46*  CALCIUM 8.6* 8.8*   GFR: Estimated Creatinine Clearance: 40 mL/min (A) (by C-G formula based on SCr of 1.46 mg/dL (H)). Liver Function Tests: Recent Labs  Lab 06/07/19 0453  AST 18  ALT 13  ALKPHOS 83  BILITOT 0.3  PROT 6.3*  ALBUMIN 2.3*   No results for input(s): LIPASE, AMYLASE in the last 168 hours. No results for input(s): AMMONIA in the last 168 hours. Coagulation Profile: No results for input(s): INR, PROTIME in the last 168 hours. Cardiac Enzymes: No results for input(s): CKTOTAL, CKMB, CKMBINDEX, TROPONINI in the last 168 hours. BNP (last 3 results) No results for input(s): PROBNP in the last 8760 hours. HbA1C: No results for input(s): HGBA1C in the last 72  hours. CBG: Recent Labs  Lab 06/06/19 0612 06/07/19 0615 06/09/19 0538 06/10/19 0530 06/11/19 0432  GLUCAP 138* 93 104* 91 95   Lipid Profile: No results for input(s): CHOL, HDL, LDLCALC, TRIG, CHOLHDL, LDLDIRECT in the last 72 hours. Thyroid Function Tests: No results for input(s): TSH, T4TOTAL, FREET4, T3FREE, THYROIDAB in the last 72 hours. Anemia Panel: No results for input(s): VITAMINB12, FOLATE, FERRITIN, TIBC, IRON, RETICCTPCT in the last 72 hours. Sepsis Labs: No results for input(s): PROCALCITON, LATICACIDVEN in the last 168 hours.  Recent Results (from the past 240 hour(s))  Acid Fast Smear (AFB)     Status: Abnormal   Collection Time: 06/06/19 12:44 PM   Specimen: Sputum  Result Value Ref Range Status   AFB Specimen Processing Concentration  Final   Acid Fast Smear Positive (A)  Final    Comment: (NOTE) 4+, more than 36 acid-fast bacilli per field at 400X magnification, fluorescent smear REPORTED RESULTS TO THOMAS L ON 06-07-19 AT 1718 FAX CONFORMATION TO [phone] Performed At: [facility] [address] [place], [place] [id number] Rush Farmer MD WU:[id number]    Source (AFB) SPUTUM  Final    Comment: Performed at [facility], [place] [address] [place], [place] [address]         Radiology Studies: No results found.      Scheduled Meds: . buPROPion  150 mg Oral Daily  . clotrimazole   Topical BID  . enoxaparin (LOVENOX) injection  40 mg Subcutaneous Q24H  . ethambutol  1,200 mg Oral Daily  . feeding supplement (ENSURE ENLIVE)  237 mL Oral Q24H  . ferrous sulfate  325 mg Oral BID WC  . ipratropium  2 puff Inhalation TID  . isoniazid  300 mg Oral Daily   And  . vitamin B-6  50 mg Oral Daily  . loratadine  10 mg Oral Q breakfast  . mouth rinse  15 mL Mouth Rinse BID  . mometasone-formoterol  2 puff Inhalation BID  . multivitamin with minerals  1 tablet Oral Daily  . pantoprazole  40 mg Oral Q0600  .  pravastatin  20 mg Oral q1800  . pyrazinamide  1,500 mg Oral Daily  . rifampin  600 mg Oral Daily  . thiamine  100 mg Oral Daily   Continuous Infusions: . sodium chloride Stopped (05/23/19 0250)     LOS: 27 days     Cordelia Poche, MD [facility] 06/11/2019, 1:22 PM  If 7PM-7AM, please contact night-coverage [email or web address]

## 2019-06-12 LAB — GLUCOSE, CAPILLARY: Glucose-Capillary: 84 mg/dL (ref 70–99)

## 2019-06-12 NOTE — Plan of Care (Signed)
  Problem: Health Behavior/Discharge Planning: Goal: Ability to manage health-related needs will improve Outcome: Progressing   Problem: Clinical Measurements: Goal: Ability to maintain clinical measurements within normal limits will improve Outcome: Progressing Goal: Will remain free from infection Outcome: Progressing Goal: Diagnostic test results will improve Outcome: Progressing Goal: Respiratory complications will improve Outcome: Progressing Goal: Cardiovascular complication will be avoided Outcome: Progressing   Problem: Activity: Goal: Risk for activity intolerance will decrease Outcome: Progressing   Problem: Safety: Goal: Ability to remain free from injury will improve Outcome: Progressing   Problem: Skin Integrity: Goal: Risk for impaired skin integrity will decrease Outcome: Progressing   

## 2019-06-12 NOTE — Progress Notes (Signed)
PROGRESS NOTE    Clarence Maldonado  LKT:[id number] DOB: 09/30/1946 DOA: 05/15/2019 PCP: [name], DO   Brief Narrative: Clarence Maldonado is a 73 y.o. male with history of stroke, COPD, CKD stage III, tobacco use, diabetes mellitus type 2, hypertension. Patient presented secondary to weakness and frequent falls, found to have pulmonary TB.   Assessment & Plan:   Principal Problem:   Pulmonary tuberculosis with cavitation Active Problems:   Essential hypertension   CKD (chronic kidney disease) stage 3, GFR 30-59 ml/min (HCC)   Hyperlipidemia   Tobacco use disorder   Controlled type 2 diabetes mellitus with stage 3 chronic kidney disease, without long-term current use of insulin (HCC)   Weight loss   Hyperlipidemia associated with type 2 diabetes mellitus (HCC)   Anemia   Moderate protein-calorie malnutrition (HCC)   Rash   Pulmonary TB Started on RIPE therapy per ID. Repeat AFB cuture (9/1) pending. AFB smear positive. -Continue RIPE per ID recommendations  Dysphagia Evaluated by speech therapy. MBS significant for evidence of moderate oropharyngeal dysphagia with sensorimotor deficit and hypomotility of the pharynx. Patient deemed a high aspiration risk and placed on a dysphagia 1 diet. -Continue chloraseptic spray, dysphagia diet  Peripheral neuropathy Concern this could be related to RIPE therapy. Started on B6. Improved.  Heart mass Seen on left atrium. Per chart review, possibly congenital. Recommendation for outpatient cardiology follow-up  Non-sustained V-tach Stable. Hypokalemia and magnesium likely contributed.  Hypokalemia/Hypomagnesemia Resolved.  Iron deficiency anemia -Continue iron supplementation  Diabetes mellitus, type 2 -Continue SSI  Weight loss Nutrition/PT consulted  CKD stage III Stable  No changes   DVT prophylaxis: Lovenox Code Status:   Code Status: Full Code Family Communication: None Disposition Plan: Discharge pending  safe discharge plan   Consultants:   ID  Procedures:   None  Antimicrobials:  Rifampin, isoniazid, pyrazinamide, ethambutol   Subjective: No issues overnight. Hoping to discharge soon.  Objective: Vitals:   06/11/19 1941 06/12/19 0500 06/12/19 0517 06/12/19 0524  BP: (!) 156/89  138/90   Pulse: (!) 108  (!) 119 80  Resp: 18  18   Temp: 98.2 F (36.8 C)  98.4 F (36.9 C)   TempSrc: Oral  Oral   SpO2: 94%  92%   Weight:  66 kg    Height:        Intake/Output Summary (Last 24 hours) at 06/12/2019 1146 Last data filed at 06/12/2019 0531 Gross per 24 hour  Intake -  Output 1650 ml  Net -1650 ml   Filed Weights   06/10/19 0604 06/11/19 0440 06/12/19 0500  Weight: 62.4 kg 61.8 kg 66 kg    Examination:  General: Well appearing, no distress   Data Reviewed: I have personally reviewed following labs and imaging studies  CBC: Recent Labs  Lab 06/08/19 0459 06/11/19 0535  WBC 6.7 6.1  HGB 9.3* 9.0*  HCT 32.5* 31.1*  MCV 84.0 83.6  PLT 172 230   Basic Metabolic Panel: Recent Labs  Lab 06/08/19 0459 06/11/19 0535  NA 136 136  K 4.7 3.9  CL 98 96*  CO2 29 28  GLUCOSE 92 95  BUN 34* 36*  CREATININE 1.44* 1.46*  CALCIUM 8.6* 8.8*   GFR: Estimated Creatinine Clearance: 41.3 mL/min (A) (by C-G formula based on SCr of 1.46 mg/dL (H)). Liver Function Tests: Recent Labs  Lab 06/07/19 0453  AST 18  ALT 13  ALKPHOS 83  BILITOT 0.3  PROT 6.3*  ALBUMIN 2.3*  No results for input(s): LIPASE, AMYLASE in the last 168 hours. No results for input(s): AMMONIA in the last 168 hours. Coagulation Profile: No results for input(s): INR, PROTIME in the last 168 hours. Cardiac Enzymes: No results for input(s): CKTOTAL, CKMB, CKMBINDEX, TROPONINI in the last 168 hours. BNP (last 3 results) No results for input(s): PROBNP in the last 8760 hours. HbA1C: No results for input(s): HGBA1C in the last 72 hours. CBG: Recent Labs  Lab 06/07/19 0615 06/09/19 0538  06/10/19 0530 06/11/19 0432 06/12/19 0513  GLUCAP 93 104* 91 95 84   Lipid Profile: No results for input(s): CHOL, HDL, LDLCALC, TRIG, CHOLHDL, LDLDIRECT in the last 72 hours. Thyroid Function Tests: No results for input(s): TSH, T4TOTAL, FREET4, T3FREE, THYROIDAB in the last 72 hours. Anemia Panel: No results for input(s): VITAMINB12, FOLATE, FERRITIN, TIBC, IRON, RETICCTPCT in the last 72 hours. Sepsis Labs: No results for input(s): PROCALCITON, LATICACIDVEN in the last 168 hours.  Recent Results (from the past 240 hour(s))  Acid Fast Smear (AFB)     Status: Abnormal   Collection Time: 06/06/19 12:44 PM   Specimen: Sputum  Result Value Ref Range Status   AFB Specimen Processing Concentration  Final   Acid Fast Smear Positive (A)  Final    Comment: (NOTE) 4+, more than 36 acid-fast bacilli per field at 400X magnification, fluorescent smear REPORTED RESULTS TO THOMAS L ON 06-07-19 AT 1718 FAX CONFORMATION TO [phone] Performed At: [facility] [address] [place], [place] [id number] Rush Farmer MD EE:[id number]    Source (AFB) SPUTUM  Final    Comment: Performed at [facility], [place] [address] [place], [place] [address]         Radiology Studies: No results found.      Scheduled Meds: . buPROPion  150 mg Oral Daily  . clotrimazole   Topical BID  . enoxaparin (LOVENOX) injection  40 mg Subcutaneous Q24H  . ethambutol  1,200 mg Oral Daily  . feeding supplement (ENSURE ENLIVE)  237 mL Oral Q24H  . ferrous sulfate  325 mg Oral BID WC  . ipratropium  2 puff Inhalation TID  . isoniazid  300 mg Oral Daily   And  . vitamin B-6  50 mg Oral Daily  . loratadine  10 mg Oral Q breakfast  . mouth rinse  15 mL Mouth Rinse BID  . mometasone-formoterol  2 puff Inhalation BID  . multivitamin with minerals  1 tablet Oral Daily  . pantoprazole  40 mg Oral Q0600  . pravastatin  20 mg Oral q1800  . pyrazinamide  1,500 mg Oral Daily  .  rifampin  600 mg Oral Daily  . thiamine  100 mg Oral Daily   Continuous Infusions: . sodium chloride Stopped (05/23/19 0250)     LOS: 28 days     Cordelia Poche, MD [facility] 06/12/2019, 11:46 AM  If 7PM-7AM, please contact night-coverage [email or web address]

## 2019-06-12 NOTE — Progress Notes (Signed)
Physical Therapy Treatment Patient Details Name: Clarence Maldonado MRN: [id number] DOB: 1946/09/26 Today's Date: 06/12/2019    History of Present Illness 73 y.o. male with medical history significant of cardiomegaly, stroke, COPD,   CKD stage III, tobacco abuse, DM 2, HTN who presented to the emergency room on 8/10 with progressive weakness over the last 3 months to the point where he was falling over. He has lost approximately 43 pounds in the past 2 months. Pt found to have Pulmonary tuberculosis with cavitation    PT Comments    Pt is slowly progressing with mobility. He continues to get fatigued and dyspneic with ambulation. O2 sat level 83% on RA during ambulation on today. Will continue to follow and progress activity as tolerated.    Follow Up Recommendations  SNF vs Home health PT;Supervision/Assistance - 24 hour(depending on bed availability)     Equipment Recommendations  Rolling walker with 5" wheels    Recommendations for Other Services       Precautions / Restrictions Precautions Precautions: Fall Precaution Comments: monitor O2 Restrictions Weight Bearing Restrictions: No    Mobility  Bed Mobility Overal bed mobility: Needs Assistance Bed Mobility: Supine to Sit     Supine to sit: Supervision     General bed mobility comments: for safety, lines.  Transfers Overall transfer level: Needs assistance Equipment used: Rolling walker (2 wheeled) Transfers: Sit to/from Stand Sit to Stand: Supervision         General transfer comment: for safety. VCs hand placement.  Ambulation/Gait Ambulation/Gait assistance: Min guard Gait Distance (Feet): 20 Feet(x2) Assistive device: Rolling walker (2 wheeled) Gait Pattern/deviations: Step-through pattern;Decreased stride length     General Gait Details: ambulated in room over to window and took standing rest break-on RA O2 reading 83%, dyspnea 3/4. Then pt returned to sitting on bed and took seated rest break;Walked  once more on 2L [place]-O2 dyspnea 2/4   Stairs             Wheelchair Mobility    Modified Rankin (Stroke Patients Only)       Balance Overall balance assessment: Needs assistance;History of Falls         Standing balance support: Bilateral upper extremity supported Standing balance-Leahy Scale: Poor                              Cognition Arousal/Alertness: Awake/alert Behavior During Therapy: WFL for tasks assessed/performed Overall Cognitive Status: Within Functional Limits for tasks assessed                                        Exercises      General Comments        Pertinent Vitals/Pain Pain Assessment: No/denies pain    Home Living                      Prior Function            PT Goals (current goals can now be found in the care plan section) Progress towards PT goals: Progressing toward goals    Frequency    Min 3X/week      PT Plan Current plan remains appropriate    Co-evaluation              AM-PAC PT "6 Clicks" Mobility   Outcome Measure  Help  needed turning from your back to your side while in a flat bed without using bedrails?: A Little Help needed moving from lying on your back to sitting on the side of a flat bed without using bedrails?: A Little Help needed moving to and from a bed to a chair (including a wheelchair)?: A Little Help needed standing up from a chair using your arms (e.g., wheelchair or bedside chair)?: A Little Help needed to walk in hospital room?: A Little Help needed climbing 3-5 steps with a railing? : A Little 6 Click Score: 18    End of Session Equipment Utilized During Treatment: Oxygen Activity Tolerance: Patient limited by fatigue Patient left: in bed;with call bell/phone within reach;with bed alarm set(pt sitting EOB. Pt agreed to not stand up)   PT Visit Diagnosis: Unsteadiness on feet (R26.81);Muscle weakness (generalized) (M62.81);Difficulty in  walking, not elsewhere classified (R26.2);Adult, failure to thrive (R62.7);History of falling (Z91.81);Repeated falls (R29.6)     Time: 2111-7356 PT Time Calculation (min) (ACUTE ONLY): 32 min  Charges:  $Gait Training: 23-37 mins                        Weston Anna, PT Acute Rehabilitation Services Pager: [phone] Office: [phone]

## 2019-06-13 LAB — GLUCOSE, CAPILLARY: Glucose-Capillary: 129 mg/dL — ABNORMAL HIGH (ref 70–99)

## 2019-06-13 NOTE — Progress Notes (Signed)
Nutrition Follow-up  RD working remotely.   DOCUMENTATION CODES:   Not applicable  INTERVENTION:  - continue Ensure Enlive once/day. - continue to encourage PO intakes.    NUTRITION DIAGNOSIS:   Inadequate oral intake related to poor appetite as evidenced by per patient/family report. -resolved.   GOAL:   Patient will meet greater than or equal to 90% of their needs -met  MONITOR:   PO intake, Supplement acceptance, Labs, Weight trends, I & O's  ASSESSMENT:   73 y.o. male with medical history significant of cardiomegaly, stroke, COPD,   CKD stage III, tobacco abuse, DM 2, HTN,  Admitted for cavitary lesions unclear etiology work-up for TB versus malignancy  Patient remains on airborne precautions d/t TB. Per flow sheet, he has consumed 100% of all meals since 9/2 and has been accepted Ensure ~75% of the time it is offered to him. Weight has fluctuated frequently since admission.      Labs reviewed; CBG: 129 mg/dl, Cl: 96 mmol/l, BUN: 36 mg/dl, creatinine: 1.46 mg/dl, GFR: 47 ml/min. Medications reviewed; 325 mg ferrous sulfate BID, 50 mg vitamin B6/day, 100 mg thiamine/day.     Diet Order:   Diet Order            DIET DYS 3 Room service appropriate? No; Fluid consistency: Thin  Diet effective now              EDUCATION NEEDS:   No education needs have been identified at this time  Skin:  Skin Assessment: Reviewed RN Assessment  Last BM:  9/7  Height:   Ht Readings from Last 1 Encounters:  05/16/19 '5\' 6"'  (1.676 m)    Weight:   Wt Readings from Last 1 Encounters:  06/13/19 65.9 kg    Ideal Body Weight:  64.5 kg  BMI:  Body mass index is 23.45 kg/m.  Estimated Nutritional Needs:   Kcal:  1600-1800  Protein:  70-80g  Fluid:  1.8L/day     Jarome Matin, MS, RD, LDN, CNSC Inpatient Clinical Dietitian Pager # [phone] After hours/weekend pager # [phone]

## 2019-06-13 NOTE — Progress Notes (Signed)
PROGRESS NOTE    Clarence Maldonado  OLI:[id number] DOB: 07/21/1946 DOA: 05/15/2019 PCP: [name], DO   Brief Narrative: Clarence Maldonado is a 73 y.o. male with history of stroke, COPD, CKD stage III, tobacco use, diabetes mellitus type 2, hypertension. Patient presented secondary to weakness and frequent falls, found to have pulmonary TB.   Assessment & Plan:   Principal Problem:   Pulmonary tuberculosis with cavitation Active Problems:   Essential hypertension   CKD (chronic kidney disease) stage 3, GFR 30-59 ml/min (HCC)   Hyperlipidemia   Tobacco use disorder   Controlled type 2 diabetes mellitus with stage 3 chronic kidney disease, without long-term current use of insulin (HCC)   Weight loss   Hyperlipidemia associated with type 2 diabetes mellitus (HCC)   Anemia   Moderate protein-calorie malnutrition (HCC)   Rash   Pulmonary TB Started on RIPE therapy per ID. Repeat AFB cuture (9/1) pending. AFB smear positive. Patient continues to make good progress in functional ability. -Continue RIPE per ID recommendations  Dysphagia Evaluated by speech therapy. MBS significant for evidence of moderate oropharyngeal dysphagia with sensorimotor deficit and hypomotility of the pharynx. Patient deemed a high aspiration risk and placed on a dysphagia 1 diet. -Continue chloraseptic spray, dysphagia diet  Peripheral neuropathy Concern this could be related to RIPE therapy. Started on B6. Improved.  Heart mass Seen on left atrium. Per chart review, possibly congenital. Recommendation for outpatient cardiology follow-up  Non-sustained V-tach Stable. Hypokalemia and magnesium likely contributed.  Hypokalemia/Hypomagnesemia Resolved.  Iron deficiency anemia -Continue iron supplementation  Diabetes mellitus, type 2 -Continue SSI  Weight loss Nutrition/PT consulted  CKD stage III Stable  No changes   DVT prophylaxis: Lovenox Code Status:   Code Status: Full Code  Family Communication: None Disposition Plan: Discharge pending safe discharge plan   Consultants:   ID  Procedures:   None  Antimicrobials:  Rifampin, isoniazid, pyrazinamide, ethambutol   Subjective: No concerns  Objective: Vitals:   06/12/19 2018 06/13/19 0421 06/13/19 0500 06/13/19 1358  BP: 139/79 140/78  (!) 142/88  Pulse: (!) 109 100  96  Resp: 18 18  20   Temp: 98.5 F (36.9 C) 98.6 F (37 C)  98 F (36.7 C)  TempSrc: Oral Oral  Oral  SpO2: 90% 92%  (!) 89%  Weight:   65.9 kg   Height:        Intake/Output Summary (Last 24 hours) at 06/13/2019 1543 Last data filed at 06/13/2019 1442 Gross per 24 hour  Intake 150 ml  Output 900 ml  Net -750 ml   Filed Weights   06/11/19 0440 06/12/19 0500 06/13/19 0500  Weight: 61.8 kg 66 kg 65.9 kg    Examination:  General: Well appearing, no distress Respiratory: Clear to auscultation bilaterally and slightly diminished. Unlabored work of breathing. No wheezing or rales.   Data Reviewed: I have personally reviewed following labs and imaging studies  CBC: Recent Labs  Lab 06/08/19 0459 06/11/19 0535  WBC 6.7 6.1  HGB 9.3* 9.0*  HCT 32.5* 31.1*  MCV 84.0 83.6  PLT 172 230   Basic Metabolic Panel: Recent Labs  Lab 06/08/19 0459 06/11/19 0535  NA 136 136  K 4.7 3.9  CL 98 96*  CO2 29 28  GLUCOSE 92 95  BUN 34* 36*  CREATININE 1.44* 1.46*  CALCIUM 8.6* 8.8*   GFR: Estimated Creatinine Clearance: 41.3 mL/min (A) (by C-G formula based on SCr of 1.46 mg/dL (H)). Liver Function Tests: Recent  Labs  Lab 06/07/19 0453  AST 18  ALT 13  ALKPHOS 83  BILITOT 0.3  PROT 6.3*  ALBUMIN 2.3*   No results for input(s): LIPASE, AMYLASE in the last 168 hours. No results for input(s): AMMONIA in the last 168 hours. Coagulation Profile: No results for input(s): INR, PROTIME in the last 168 hours. Cardiac Enzymes: No results for input(s): CKTOTAL, CKMB, CKMBINDEX, TROPONINI in the last 168 hours. BNP (last  3 results) No results for input(s): PROBNP in the last 8760 hours. HbA1C: No results for input(s): HGBA1C in the last 72 hours. CBG: Recent Labs  Lab 06/09/19 0538 06/10/19 0530 06/11/19 0432 06/12/19 0513 06/13/19 0756  GLUCAP 104* 91 95 84 129*   Lipid Profile: No results for input(s): CHOL, HDL, LDLCALC, TRIG, CHOLHDL, LDLDIRECT in the last 72 hours. Thyroid Function Tests: No results for input(s): TSH, T4TOTAL, FREET4, T3FREE, THYROIDAB in the last 72 hours. Anemia Panel: No results for input(s): VITAMINB12, FOLATE, FERRITIN, TIBC, IRON, RETICCTPCT in the last 72 hours. Sepsis Labs: No results for input(s): PROCALCITON, LATICACIDVEN in the last 168 hours.  Recent Results (from the past 240 hour(s))  Acid Fast Smear (AFB)     Status: Abnormal   Collection Time: 06/06/19 12:44 PM   Specimen: Sputum  Result Value Ref Range Status   AFB Specimen Processing Concentration  Final   Acid Fast Smear Positive (A)  Final    Comment: (NOTE) 4+, more than 36 acid-fast bacilli per field at 400X magnification, fluorescent smear REPORTED RESULTS TO THOMAS L ON 06-07-19 AT 1718 FAX CONFORMATION TO [phone] Performed At: [facility] [address] [place], [place] [id number] Rush Farmer MD FA:[id number]    Source (AFB) SPUTUM  Final    Comment: Performed at [facility], [place] [address], [place], [place] [address]         Radiology Studies: No results found.      Scheduled Meds: . buPROPion  150 mg Oral Daily  . clotrimazole   Topical BID  . enoxaparin (LOVENOX) injection  40 mg Subcutaneous Q24H  . ethambutol  1,200 mg Oral Daily  . feeding supplement (ENSURE ENLIVE)  237 mL Oral Q24H  . ferrous sulfate  325 mg Oral BID WC  . ipratropium  2 puff Inhalation TID  . isoniazid  300 mg Oral Daily   And  . vitamin B-6  50 mg Oral Daily  . loratadine  10 mg Oral Q breakfast  . mouth rinse  15 mL Mouth Rinse BID  . mometasone-formoterol   2 puff Inhalation BID  . multivitamin with minerals  1 tablet Oral Daily  . pantoprazole  40 mg Oral Q0600  . pravastatin  20 mg Oral q1800  . pyrazinamide  1,500 mg Oral Daily  . rifampin  600 mg Oral Daily  . thiamine  100 mg Oral Daily   Continuous Infusions: . sodium chloride Stopped (05/23/19 0250)     LOS: 29 days     Cordelia Poche, MD [facility] 06/13/2019, 3:43 PM  If 7PM-7AM, please contact night-coverage [email or web address]

## 2019-06-13 NOTE — Progress Notes (Signed)
Patient ID: Clarence Maldonado, male   DOB: 18-Sep-1946, 73 y.o.   MRN: [id number]         [facility] for Infectious Disease  Date of Admission:  05/15/2019           Day 26 TB therapy (RIPE) ASSESSMENT: He is improving slowly.  He still requires a lot of assistance but seems to be making some progress with physical therapy.  His appetite has improved and he is gained about 25 pounds since admission.  His AFB culture obtained 1 week ago remains negative so far.  Unfortunately, he will not be a candidate for skilled nursing facility placement anytime in the near future and remains too weak to live independently.  PLAN: 1. Continue RIPE 2. I will follow-up in 1 week  Principal Problem:   Pulmonary tuberculosis with cavitation Active Problems:   Rash   Essential hypertension   CKD (chronic kidney disease) stage 3, GFR 30-59 ml/min (HCC)   Hyperlipidemia   Tobacco use disorder   Controlled type 2 diabetes mellitus with stage 3 chronic kidney disease, without long-term current use of insulin (HCC)   Weight loss   Hyperlipidemia associated with type 2 diabetes mellitus (HCC)   Anemia   Moderate protein-calorie malnutrition (HCC)   Scheduled Meds: . buPROPion  150 mg Oral Daily  . clotrimazole   Topical BID  . enoxaparin (LOVENOX) injection  40 mg Subcutaneous Q24H  . ethambutol  1,200 mg Oral Daily  . feeding supplement (ENSURE ENLIVE)  237 mL Oral Q24H  . ferrous sulfate  325 mg Oral BID WC  . ipratropium  2 puff Inhalation TID  . isoniazid  300 mg Oral Daily   And  . vitamin B-6  50 mg Oral Daily  . loratadine  10 mg Oral Q breakfast  . mouth rinse  15 mL Mouth Rinse BID  . mometasone-formoterol  2 puff Inhalation BID  . multivitamin with minerals  1 tablet Oral Daily  . pantoprazole  40 mg Oral Q0600  . pravastatin  20 mg Oral q1800  . pyrazinamide  1,500 mg Oral Daily  . rifampin  600 mg Oral Daily  . thiamine  100 mg Oral Daily   Continuous Infusions: . sodium  chloride Stopped (05/23/19 0250)   PRN Meds:.sodium chloride, acetaminophen **OR** acetaminophen, alum & mag hydroxide-simeth, dextrose, HYDROcodone-acetaminophen, hydrocortisone cream, levalbuterol, loperamide, magic mouthwash w/lidocaine, ondansetron **OR** ondansetron (ZOFRAN) IV, phenol, Resource ThickenUp Clear, zolpidem   SUBJECTIVE: He is feeling much better.  He still gets short of breath when up walking.  He did walk in his room yesterday with PT.  Review of Systems: Review of Systems  Constitutional: Positive for malaise/fatigue and weight loss. Negative for chills, diaphoresis and fever.  Respiratory: Positive for cough, sputum production and shortness of breath. Negative for hemoptysis.   Cardiovascular: Negative for chest pain.  Gastrointestinal: Negative for abdominal pain, diarrhea, nausea and vomiting.    No Known Allergies  OBJECTIVE: Vitals:   06/12/19 1340 06/12/19 2018 06/13/19 0421 06/13/19 0500  BP: (!) 143/92 139/79 140/78   Pulse: (!) 110 (!) 109 100   Resp: 20 18 18    Temp: 98.1 F (36.7 C) 98.5 F (36.9 C) 98.6 F (37 C)   TempSrc: Oral Oral Oral   SpO2: 93% 90% 92%   Weight:    65.9 kg  Height:       Body mass index is 23.45 kg/m.  Physical Exam Constitutional:      Comments: He  is talkative and in good spirits.    Cardiovascular:     Rate and Rhythm: Normal rate and regular rhythm.     Heart sounds: No murmur.  Pulmonary:     Effort: Pulmonary effort is normal.     Breath sounds: Rhonchi present. No wheezing or rales.  Abdominal:     Palpations: Abdomen is soft.     Tenderness: There is no abdominal tenderness.  Skin:    Findings: No erythema.  Psychiatric:        Mood and Affect: Mood normal.     Lab Results Lab Results  Component Value Date   WBC 6.1 06/11/2019   HGB 9.0 (L) 06/11/2019   HCT 31.1 (L) 06/11/2019   MCV 83.6 06/11/2019   PLT 230 06/11/2019    Lab Results  Component Value Date   CREATININE 1.46 (H) 06/11/2019    BUN 36 (H) 06/11/2019   NA 136 06/11/2019   K 3.9 06/11/2019   CL 96 (L) 06/11/2019   CO2 28 06/11/2019    Lab Results  Component Value Date   ALT 13 06/07/2019   AST 18 06/07/2019   ALKPHOS 83 06/07/2019   BILITOT 0.3 06/07/2019     Microbiology: Recent Results (from the past 240 hour(s))  Acid Fast Smear (AFB)     Status: Abnormal   Collection Time: 06/06/19 12:44 PM   Specimen: Sputum  Result Value Ref Range Status   AFB Specimen Processing Concentration  Final   Acid Fast Smear Positive (A)  Final    Comment: (NOTE) 4+, more than 36 acid-fast bacilli per field at 400X magnification, fluorescent smear REPORTED RESULTS TO THOMAS L ON 06-07-19 AT 1718 FAX CONFORMATION TO [phone] Performed At: [facility] [address] [place], [place] [id number] Jolene Schimke MD BO:[id number]    Source (AFB) SPUTUM  Final    Comment: Performed at [facility], [address] [place], [place] [address]    Cliffton Asters, MD [facility] [phone] pager   [phone] cell 06/13/2019, 1:16 PM

## 2019-06-13 NOTE — Plan of Care (Signed)
Pt transferred to/from [facility] and tolerated well. (RA)

## 2019-06-14 LAB — HEPATIC FUNCTION PANEL
ALT: 12 U/L (ref 0–44)
AST: 17 U/L (ref 15–41)
Albumin: 2.6 g/dL — ABNORMAL LOW (ref 3.5–5.0)
Alkaline Phosphatase: 82 U/L (ref 38–126)
Bilirubin, Direct: 0.1 mg/dL (ref 0.0–0.2)
Indirect Bilirubin: 0.3 mg/dL (ref 0.3–0.9)
Total Bilirubin: 0.4 mg/dL (ref 0.3–1.2)
Total Protein: 6.7 g/dL (ref 6.5–8.1)

## 2019-06-14 LAB — ACID FAST CULTURE WITH REFLEXED SENSITIVITIES (MYCOBACTERIA): Acid Fast Culture: POSITIVE — AB

## 2019-06-14 LAB — MTB SUSCEPTIBILITY BROTH, REFLEXED

## 2019-06-14 LAB — AFB ORGANISM ID BY DNA PROBE
M avium complex: NEGATIVE
M tuberculosis complex: POSITIVE — AB

## 2019-06-14 MED ORDER — TRAZODONE HCL 50 MG PO TABS
50.0000 mg | ORAL_TABLET | Freq: Every evening | ORAL | Status: DC | PRN
  Administered 2019-06-15 – 2019-07-19 (×21): 50 mg via ORAL
  Filled 2019-06-14 (×21): qty 1

## 2019-06-14 NOTE — Progress Notes (Signed)
Occupational Therapy Treatment and goal update Patient Details Name: Clarence Maldonado MRN: [id number] DOB: 1946-05-13 Today's Date: 06/14/2019    History of present illness 73 y.o. male with medical history significant of cardiomegaly, stroke, COPD,   CKD stage III, tobacco abuse, DM 2, HTN who presented to the emergency room on 8/10 with progressive weakness over the last 3 months to the point where he was falling over. He has lost approximately 43 pounds in the past 2 months. Pt found to have Pulmonary tuberculosis with cavitation   OT comments  Pt is making good progress with OT.  He mostly needs min guard assist for adls and SPT.  Upgraded goals today.    Follow Up Recommendations  SNF;Supervision/Assistance - 24 hour(HHOT, if home)    Equipment Recommendations  3 in 1 bedside commode    Recommendations for Other Services      Precautions / Restrictions Precautions Precautions: Fall Precaution Comments: monitor O2 Restrictions Weight Bearing Restrictions: No       Mobility Bed Mobility           Sit to supine: Modified independent (Device/Increase time)      Transfers       Sit to Stand: Min guard Stand pivot transfers: Min guard       General transfer comment: used bedrail and arm rest of 3:1 commode    Balance                                           ADL either performed or assessed with clinical judgement   ADL                       Lower Body Dressing: Min guard;Sit to/from stand   Toilet Transfer: Min guard;Stand-pivot;BSC     Toileting - Clothing Manipulation Details (indicate cue type and reason): declined wiping; only urinated on toilet       General ADL Comments: pt is able to cross legs for LB adls. He washed face but did not want to wash any more.  Gave pt a long shoehorn; which he is using as a back scratcher. Also gave him a long sponge for his back and reacher to retrieve items he drops. He was very  excited to receive these items.     Vision       Perception     Praxis      Cognition Arousal/Alertness: Awake/alert Behavior During Therapy: WFL for tasks assessed/performed Overall Cognitive Status: Within Functional Limits for tasks assessed                                          Exercises     Shoulder Instructions       General Comments HR 128; sats 95% on RA sitting EOB.  Pt states he is only using 02 when he walks    Pertinent Vitals/ Pain       Pain Assessment: No/denies pain  Home Living                                          Prior Functioning/Environment  Frequency  Min 2X/week        Progress Toward Goals  OT Goals(current goals can now be found in the care plan section)  Progress towards OT goals: Progressing toward goals  Acute Rehab OT Goals Time For Goal Achievement: 06/28/19 Potential to Achieve Goals: Good ADL Goals Additional ADL Goal #1: pt will perform adl at set up supervision level Additional ADL Goal #2: discontinue  Plan      Co-evaluation                 AM-PAC OT "6 Clicks" Daily Activity     Outcome Measure   Help from another person eating meals?: None Help from another person taking care of personal grooming?: A Little Help from another person toileting, which includes using toliet, bedpan, or urinal?: A Little Help from another person bathing (including washing, rinsing, drying)?: A Little Help from another person to put on and taking off regular upper body clothing?: A Little Help from another person to put on and taking off regular lower body clothing?: A Little 6 Click Score: 19    End of Session    OT Visit Diagnosis: Unsteadiness on feet (R26.81)   Activity Tolerance Patient tolerated treatment well   Patient Left in bed;with call bell/phone within reach;with bed alarm set   Nurse Communication          Time: 0932-3557 OT Time Calculation  (min): 26 min  Charges: OT General Charges $OT Visit: 1 Visit OT Treatments $Self Care/Home Management : 23-37 mins  Lesle Chris, OTR/L Acute Rehabilitation Services [phone] [facility] pager [phone] office 06/14/2019   Khamora Karan 06/14/2019, 3:41 PM

## 2019-06-14 NOTE — Progress Notes (Addendum)
Physical Therapy Treatment Patient Details Name: Clarence Maldonado MRN: [id number] DOB: [birth year] Today's Date: 06/14/2019    History of Present Illness 73 y.o. male with medical history significant of cardiomegaly, stroke, COPD,   CKD stage III, tobacco abuse, DM 2, HTN who presented to the emergency room on 8/10 with progressive weakness over the last 3 months to the point where he was falling over. He has lost approximately 43 pounds in the past 2 months. Pt found to have Pulmonary tuberculosis with cavitation    PT Comments    Progressing with mobility. SpO2 89% on RA with ambulation on today (very short distance however); 96% at rest. Will continue to follow and progress activity.    Follow Up Recommendations  SNF;Home health PT;Supervision/Assistance - 24 hour     Equipment Recommendations       Recommendations for Other Services       Precautions / Restrictions Precautions Precautions: Fall Precaution Comments: monitor O2 Restrictions Weight Bearing Restrictions: No    Mobility  Bed Mobility Overal bed mobility: Modified Independent         Sit to supine: Modified independent (Device/Increase time)      Transfers Overall transfer level: Needs assistance Equipment used: Rolling walker (2 wheeled);None Transfers: Sit to/from [employer] Sit to Stand: Supervision Stand pivot transfers: Supervision       General transfer comment: for safety. no cues given on today. Pt also performed a stand pivot x 2, bed<>bsc, without device.  Ambulation/Gait Ambulation/Gait assistance: Min guard  Gait Distance (Feet): 20 Feet Assistive device: Rolling walker (2 wheeled) Gait Pattern/deviations: Step-through pattern     General Gait Details: ambulated in room over to window and took standing rest break-on RA O2 reading 92%, dyspnea 2/4. Pt then walked back to bed-on RA O2 reading 89%,dyspnea 2/4.   Stairs             Wheelchair Mobility     Modified Rankin (Stroke Patients Only)       Balance Overall balance assessment: Needs assistance         Standing balance support: Bilateral upper extremity supported Standing balance-Leahy Scale: Poor                              Cognition Arousal/Alertness: Awake/alert Behavior During Therapy: WFL for tasks assessed/performed Overall Cognitive Status: Within Functional Limits for tasks assessed                                        Exercises      General Comments General comments (skin integrity, edema, etc.): HR 128; sats 95% on RA sitting EOB.  Pt states he is only using 02 when he walks      Pertinent Vitals/Pain Pain Assessment: No/denies pain    Home Living                      Prior Function            PT Goals (current goals can now be found in the care plan section) Progress towards PT goals: Progressing toward goals    Frequency    Min 3X/week      PT Plan Current plan remains appropriate    Co-evaluation              AM-PAC PT "6 Clicks"  Mobility   Outcome Measure  Help needed turning from your back to your side while in a flat bed without using bedrails?: None Help needed moving from lying on your back to sitting on the side of a flat bed without using bedrails?: None Help needed moving to and from a bed to a chair (including a wheelchair)?: A Little Help needed standing up from a chair using your arms (e.g., wheelchair or bedside chair)?: A Little Help needed to walk in hospital room?: A Little Help needed climbing 3-5 steps with a railing? : A Little 6 Click Score: 20    End of Session   Activity Tolerance: Patient tolerated treatment well Patient left: in bed;with call bell/phone within reach;with bed alarm set   PT Visit Diagnosis: Unsteadiness on feet (R26.81);Muscle weakness (generalized) (M62.81);Adult, failure to thrive (R62.7);History of falling (Z91.81);Repeated falls (R29.6)      Time: 3976-7341 PT Time Calculation (min) (ACUTE ONLY): 29 min  Charges:  $Gait Training: 8-22 mins $Therapeutic Activity: 8-22 mins                       Weston Anna, PT Acute Rehabilitation Services Pager: [phone] Office: [phone]

## 2019-06-14 NOTE — Progress Notes (Addendum)
PROGRESS NOTE    Clarence Maldonado  NPY:[id number] DOB: [birth year] DOA: 05/15/2019 PCP: [name], DO   Brief Narrative: Clarence Maldonado is a 73 y.o. male with history of stroke, COPD, CKD stage III, tobacco use, diabetes mellitus type 2, hypertension. Patient presented secondary to weakness and frequent falls, found to have pulmonary TB.   Assessment & Plan:   Principal Problem:   Pulmonary tuberculosis with cavitation Active Problems:   Essential hypertension   CKD (chronic kidney disease) stage 3, GFR 30-59 ml/min (HCC)   Hyperlipidemia   Tobacco use disorder   Controlled type 2 diabetes mellitus with stage 3 chronic kidney disease, without long-term current use of insulin (HCC)   Weight loss   Hyperlipidemia associated with type 2 diabetes mellitus (HCC)   Anemia   Moderate protein-calorie malnutrition (HCC)   Rash   Pulmonary TB Started on RIPE therapy per ID.  Last AFB cuture (9/1) still pending, but AFB smear positive. Patient continues to make good progress in functional ability. -Continue RIPE per ID recommendations  Dysphagia Evaluated by speech therapy. MBS significant for evidence of moderate oropharyngeal dysphagia with sensorimotor deficit and hypomotility of the pharynx. Patient deemed a high aspiration risk and placed on a dysphagia 1 diet. -Improving, continue chloraseptic spray, dysphagia diet  Peripheral neuropathy Concern this could be related to RIPE therapy. Started on B6.  No further complaint for now.  Heart mass Seen on left atrium. Per chart review, possibly congenital. Recommendation for outpatient cardiology follow-up  Non-sustained V-tach Stable. Hypokalemia and magnesium likely contributed.  Hypokalemia/Hypomagnesemia Resolved.  twice weekly labs  Iron deficiency anemia -Continue iron supplementation No further bleeding.  Diabetes mellitus, type 2 -Continue SSI Blood sugars well controlled.  Weight loss Nutrition/PT  consulted  improvement in the hospital 25+ pound weight gain.  CKD stage III Stable Urinating adequately.  Insomnia. Medications adjusted.   DVT prophylaxis: Lovenox Code Status:   Code Status: Full Code Family Communication: None Disposition Plan: Discharge pending safe discharge plan   Consultants:   ID  Procedures:   None  Antimicrobials:  Rifampin, isoniazid, pyrazinamide, ethambutol   Subjective: Reports insomnia.  Also reports lower back pain.  No nausea no vomiting.  No diarrhea reported.  No fever no chills.  No chest pain no abdominal pain.  Objective: Vitals:   06/14/19 0500 06/14/19 0508 06/14/19 1015 06/14/19 1317  BP:  (!) 143/75  (!) 155/87  Pulse:  67  (!) 102  Resp:  16  18  Temp:  98.7 F (37.1 C)  98 F (36.7 C)  TempSrc:  Oral  Oral  SpO2:  98% 92% 96%  Weight: 63.6 kg     Height:        Intake/Output Summary (Last 24 hours) at 06/14/2019 1814 Last data filed at 06/14/2019 1200 Gross per 24 hour  Intake 680 ml  Output 800 ml  Net -120 ml   Filed Weights   06/12/19 0500 06/13/19 0500 06/14/19 0500  Weight: 66 kg 65.9 kg 63.6 kg    Examination: General: alert and oriented to time, place, and person. Appear in mild distress, affect appropriate Eyes: PERRL, Conjunctiva normal ENT: Oral Mucosa Clear, moist  Neck: no JVD, no Abnormal Mass Or lumps Cardiovascular: S1 and S2 Present, no Murmur, peripheral pulses symmetrical Respiratory: increased respiratory effort, Bilateral Air entry equal and Decreased, no signs of accessory muscle use, bilateral  Crackles, no wheezes Abdomen: Bowel Sound present, Soft and no tenderness, no hernia Skin: no rashes  Extremities: no Pedal edema, no calf tenderness Neurologic: without any new focal findings Gait not checked due to patient safety concerns    Data Reviewed: I have personally reviewed following labs and imaging studies  CBC: Recent Labs  Lab 06/08/19 0459 06/11/19 0535  WBC 6.7 6.1   HGB 9.3* 9.0*  HCT 32.5* 31.1*  MCV 84.0 83.6  PLT 172 267   Basic Metabolic Panel: Recent Labs  Lab 06/08/19 0459 06/11/19 0535  NA 136 136  K 4.7 3.9  CL 98 96*  CO2 29 28  GLUCOSE 92 95  BUN 34* 36*  CREATININE 1.44* 1.46*  CALCIUM 8.6* 8.8*   GFR: Estimated Creatinine Clearance: 41.1 mL/min (A) (by C-G formula based on SCr of 1.46 mg/dL (H)). Liver Function Tests: Recent Labs  Lab 06/14/19 0522  AST 17  ALT 12  ALKPHOS 82  BILITOT 0.4  PROT 6.7  ALBUMIN 2.6*   No results for input(s): LIPASE, AMYLASE in the last 168 hours. No results for input(s): AMMONIA in the last 168 hours. Coagulation Profile: No results for input(s): INR, PROTIME in the last 168 hours. Cardiac Enzymes: No results for input(s): CKTOTAL, CKMB, CKMBINDEX, TROPONINI in the last 168 hours. BNP (last 3 results) No results for input(s): PROBNP in the last 8760 hours. HbA1C: No results for input(s): HGBA1C in the last 72 hours. CBG: Recent Labs  Lab 06/09/19 0538 06/10/19 0530 06/11/19 0432 06/12/19 0513 06/13/19 0756  GLUCAP 104* 91 95 84 129*   Lipid Profile: No results for input(s): CHOL, HDL, LDLCALC, TRIG, CHOLHDL, LDLDIRECT in the last 72 hours. Thyroid Function Tests: No results for input(s): TSH, T4TOTAL, FREET4, T3FREE, THYROIDAB in the last 72 hours. Anemia Panel: No results for input(s): VITAMINB12, FOLATE, FERRITIN, TIBC, IRON, RETICCTPCT in the last 72 hours. Sepsis Labs: No results for input(s): PROCALCITON, LATICACIDVEN in the last 168 hours.  Recent Results (from the past 240 hour(s))  Acid Fast Smear (AFB)     Status: Abnormal   Collection Time: 06/06/19 12:44 PM   Specimen: Sputum  Result Value Ref Range Status   AFB Specimen Processing Concentration  Final   Acid Fast Smear Positive (A)  Final    Comment: (NOTE) 4+, more than 36 acid-fast bacilli per field at 400X magnification, fluorescent smear REPORTED RESULTS TO THOMAS L ON 06-07-19 AT 1718 FAX  CONFORMATION TO [phone] Performed At: [facility] [address] [place], [place] [id number] Rush Farmer MD QB:[id number]    Source (AFB) SPUTUM  Final    Comment: Performed at [facility], [place] [address] [place], [place] [address]         Radiology Studies: No results found.      Scheduled Meds: . buPROPion  150 mg Oral Daily  . clotrimazole   Topical BID  . enoxaparin (LOVENOX) injection  40 mg Subcutaneous Q24H  . ethambutol  1,200 mg Oral Daily  . feeding supplement (ENSURE ENLIVE)  237 mL Oral Q24H  . ferrous sulfate  325 mg Oral BID WC  . ipratropium  2 puff Inhalation TID  . isoniazid  300 mg Oral Daily   And  . vitamin B-6  50 mg Oral Daily  . loratadine  10 mg Oral Q breakfast  . mouth rinse  15 mL Mouth Rinse BID  . mometasone-formoterol  2 puff Inhalation BID  . multivitamin with minerals  1 tablet Oral Daily  . pantoprazole  40 mg Oral Q0600  . pravastatin  20 mg Oral q1800  .  pyrazinamide  1,500 mg Oral Daily  . rifampin  600 mg Oral Daily  . thiamine  100 mg Oral Daily   Continuous Infusions: . sodium chloride Stopped (05/23/19 0250)     LOS: 30 days     Lynden OxfordPranav Patel  [facility] 06/14/2019, 6:14 PM  If 7PM-7AM, please contact night-coverage [email or web address]

## 2019-06-15 NOTE — Progress Notes (Addendum)
PROGRESS NOTE    Clarence Maldonado  ZYS:[id number] DOB: January 17, 1946 DOA: 05/15/2019 PCP: Gayland Curry, DO   Brief Narrative: Clarence Maldonado is a 73 y.o. male with history of stroke, COPD, CKD stage III, tobacco use, diabetes mellitus type 2, hypertension. Patient presented secondary to weakness and frequent falls, found to have pulmonary TB.  Assessment & Plan:   Principal Problem:   Pulmonary tuberculosis with cavitation Active Problems:   Essential hypertension   CKD (chronic kidney disease) stage 3, GFR 30-59 ml/min (HCC)   Hyperlipidemia   Tobacco use disorder   Controlled type 2 diabetes mellitus with stage 3 chronic kidney disease, without long-term current use of insulin (HCC)   Weight loss   Hyperlipidemia associated with type 2 diabetes mellitus (HCC)   Anemia   Moderate protein-calorie malnutrition (HCC)   Rash   Pulmonary TB Started on RIPE therapy per ID.  Last AFB cuture (9/1) still pending, but AFB smear positive. Patient continues to make good progress in functional ability. -Continue rifampin, isoniazid, pyrazinamide, ethambutol. -Per ID patient's disease burden will require 4 to 6 weeks of therapy at a minimum before clearance.  Dysphagia Evaluated by speech therapy. MBS significant for evidence of moderate oropharyngeal dysphagia with sensorimotor deficit and hypomotility of the pharynx. Patient deemed a high aspiration risk and placed on a dysphagia 1 diet. -Continue dysphagia diet. -Speech therapy consult appreciated.  Peripheral neuropathy Concern this could be related to RIPE therapy. Started on B6.  Symptoms resolved for now.  Left atrial mobile mass Echocardiogram on 05/16/2019 showed a possible left atrial mobile mass. 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 Outpatient cardiology follow-up.  Non-sustained V-tach Resolved. Hypokalemia and magnesium likely contributed. Biweekly labs.  Hypokalemia/Hypomagnesemia Resolved for  now. Biweekly labs.  Iron deficiency anemia -Continue iron supplementation No bleeding reported.  Diabetes mellitus, type 2, controlled without any complication. -Hemoglobin A1c 6.3 on 05/16/2019. Blood sugars are well controlled. Continue SSI  Weight loss Nutrition/PT consulted  improvement in the hospital 25+ pound weight gain.  CKD stage III Stable Urinating adequately.  Insomnia. Trazodone at nightly as needed. We will continue it on discharge.   DVT prophylaxis: Lovenox Code Status:   Code Status: Full Code Family Communication: None Disposition Plan: Discharge pending safe discharge plan.  patient lives in a trailer alone is going does not have safe amenities and will require significant assistance.  To live on his own can put others at risk for exposure.   Consultants:   ID  Procedures:   None  Antimicrobials:  Rifampin, isoniazid, pyrazinamide, ethambutol   Subjective: Slept better last night.  Feels relaxed.  No nausea no vomiting.  Breathing okay.  No chest pain abdominal pain.  Objective: Vitals:   06/14/19 2132 06/15/19 0532 06/15/19 0942 06/15/19 1527  BP: (!) 141/81 132/75  136/84  Pulse: (!) 105 (!) 108  (!) 53  Resp: 18 18  (!) 23  Temp: 97.6 F (36.4 C) 98.7 F (37.1 C)  98.2 F (36.8 C)  TempSrc: Oral Oral  Oral  SpO2: 95% 94% 93% (!) 82%  Weight:      Height:        Intake/Output Summary (Last 24 hours) at 06/15/2019 1532 Last data filed at 06/15/2019 0915 Gross per 24 hour  Intake 200 ml  Output 1000 ml  Net -800 ml   Filed Weights   06/12/19 0500 06/13/19 0500 06/14/19 0500  Weight: 66 kg 65.9 kg 63.6 kg    Examination:  General: alert and oriented to time, place, and person. Appear in mild distress, affect appropriate Eyes: PERRL, Conjunctiva normal ENT: Oral Mucosa Clear, moist  Neck: no JVD, no Abnormal Mass Or lumps Cardiovascular: S1 and S2 Present, no Murmur, peripheral pulses symmetrical Respiratory: good  respiratory effort, Bilateral Air entry equal and Decreased, no signs of accessory muscle use, bilateral Crackles, no wheezes Abdomen: Bowel Sound present, Soft and no tenderness, no hernia Skin: no rashes  Extremities: no Pedal edema, no calf tenderness Neurologic: without any new focal findings Gait not checked due to patient safety concerns  Data Reviewed: I have personally reviewed following labs and imaging studies  CBC: Recent Labs  Lab 06/11/19 0535  WBC 6.1  HGB 9.0*  HCT 31.1*  MCV 83.6  PLT 230   Basic Metabolic Panel: Recent Labs  Lab 06/11/19 0535  NA 136  K 3.9  CL 96*  CO2 28  GLUCOSE 95  BUN 36*  CREATININE 1.46*  CALCIUM 8.8*   GFR: Estimated Creatinine Clearance: 41.1 mL/min (A) (by C-G formula based on SCr of 1.46 mg/dL (H)). Liver Function Tests: Recent Labs  Lab 06/14/19 0522  AST 17  ALT 12  ALKPHOS 82  BILITOT 0.4  PROT 6.7  ALBUMIN 2.6*   No results for input(s): LIPASE, AMYLASE in the last 168 hours. No results for input(s): AMMONIA in the last 168 hours. Coagulation Profile: No results for input(s): INR, PROTIME in the last 168 hours. Cardiac Enzymes: No results for input(s): CKTOTAL, CKMB, CKMBINDEX, TROPONINI in the last 168 hours. BNP (last 3 results) No results for input(s): PROBNP in the last 8760 hours. HbA1C: No results for input(s): HGBA1C in the last 72 hours. CBG: Recent Labs  Lab 06/09/19 0538 06/10/19 0530 06/11/19 0432 06/12/19 0513 06/13/19 0756  GLUCAP 104* 91 95 84 129*   Lipid Profile: No results for input(s): CHOL, HDL, LDLCALC, TRIG, CHOLHDL, LDLDIRECT in the last 72 hours. Thyroid Function Tests: No results for input(s): TSH, T4TOTAL, FREET4, T3FREE, THYROIDAB in the last 72 hours. Anemia Panel: No results for input(s): VITAMINB12, FOLATE, FERRITIN, TIBC, IRON, RETICCTPCT in the last 72 hours. Sepsis Labs: No results for input(s): PROCALCITON, LATICACIDVEN in the last 168 hours.  Recent Results  (from the past 240 hour(s))  Acid Fast Smear (AFB)     Status: Abnormal   Collection Time: 06/06/19 12:44 PM   Specimen: Sputum  Result Value Ref Range Status   AFB Specimen Processing Concentration  Final   Acid Fast Smear Positive (A)  Final    Comment: (NOTE) 4+, more than 36 acid-fast bacilli per field at 400X magnification, fluorescent smear REPORTED RESULTS TO THOMAS L ON 06-07-19 AT 1718 FAX CONFORMATION TO [phone] Performed At: [facility] [place] [address] [place], [place] [id number] Jolene SchimkeNagendra Sanjai MD WG:[id number]:[phone]    Source (AFB) SPUTUM  Final    Comment: Performed at [facility], [address] [place], [place] [address]         Radiology Studies: No results found.      Scheduled Meds: . buPROPion  150 mg Oral Daily  . clotrimazole   Topical BID  . enoxaparin (LOVENOX) injection  40 mg Subcutaneous Q24H  . ethambutol  1,200 mg Oral Daily  . feeding supplement (ENSURE ENLIVE)  237 mL Oral Q24H  . ferrous sulfate  325 mg Oral BID WC  . ipratropium  2 puff Inhalation TID  . isoniazid  300 mg Oral Daily   And  . vitamin B-6  50 mg Oral Daily  . loratadine  10 mg Oral Q breakfast  . mouth rinse  15 mL Mouth Rinse BID  . mometasone-formoterol  2 puff Inhalation BID  . multivitamin with minerals  1 tablet Oral Daily  . pantoprazole  40 mg Oral Q0600  . pravastatin  20 mg Oral q1800  . pyrazinamide  1,500 mg Oral Daily  . rifampin  600 mg Oral Daily  . thiamine  100 mg Oral Daily   Continuous Infusions: . sodium chloride Stopped (05/23/19 0250)     LOS: 31 days     Cannen Dupras  [facility] 06/15/2019, 3:32 PM  If 7PM-7AM, please contact night-coverage [email or web address]

## 2019-06-15 NOTE — Progress Notes (Signed)
  Speech Language Pathology Treatment: Dysphagia  Patient Details Name: Clarence Maldonado MRN: [id number] DOB: 05/04/1946 Today's Date: 06/15/2019 Time: 9678-9381 SLP Time Calculation (min) (ACUTE ONLY): 25 min  Assessment / Plan / Recommendation Clinical Impression  Pt now has his dentures and his acute dysphagia has resolved due to his significant recovery.  His voice and cough are strong and there are NO Indications of airway compromise with po provided.  He easily passed [facility] 3 ounce water test which he has not been able to pass since admission.  Note pt with mild left facial asymmetry.  He is able to feed himself without increased WOB nor oral retention.  Pt has gained 25 pounds since admit per dietician note.   Recommend to advance diet to regular/thin = no SLP follow up indicated.  Did not continue RMST as pt did not tolerate this well during last trial due to c/o dyspnea.  Thanks for allowing me to help care for this most pleasant pt.  Using teach back, all education completed.    HPI HPI: 73 year old male admitted 05/15/2019 with progressive weakness and cough, failure to thrive. Pt lives alone. PMH: cardiomegaly, CVA (2009), COPD, HTN, DM, obesity, CKD III, tobacco abuse. Lung sounds: some scattered rhonchi. Course rhonchus cough.  CXR = bilateral infiltrates. Chest CT = bilateral diffuse nodular infiltrates with cavitation and a large right upper lobe cystic lesion      SLP Plan  All goals met       Recommendations  Liquids provided via: Cup;Straw Medication Administration: Whole meds with puree Supervision: Patient able to self feed Compensations: Minimize environmental distractions;Slow rate;Small sips/bites Postural Changes and/or Swallow Maneuvers: Seated upright 90 degrees;Upright 30-60 min after meal                Oral Care Recommendations: Oral care BID Follow up Recommendations: None SLP Visit Diagnosis: Dysphagia, oropharyngeal phase (R13.12) Plan: All goals  met       GO                [name] 06/15/2019, 4:04 PM   Luanna Salk, [place] [facility] SLP Acute Rehab Services Pager [phone] Office [phone]

## 2019-06-16 LAB — GLUCOSE, CAPILLARY: Glucose-Capillary: 131 mg/dL — ABNORMAL HIGH (ref 70–99)

## 2019-06-16 NOTE — Progress Notes (Addendum)
PROGRESS NOTE    Clarence Maldonado  EXN:[id number] DOB: 01-16-1946 DOA: 05/15/2019 PCP: Gayland Curry, DO   Brief Narrative: Clarence Maldonado is a 73 y.o. male with history of stroke, COPD, CKD stage III, tobacco use, diabetes mellitus type 2, hypertension. Patient presented secondary to weakness and frequent falls, found to have pulmonary TB.  Assessment & Plan:   Principal Problem:   Pulmonary tuberculosis with cavitation Active Problems:   Essential hypertension   CKD (chronic kidney disease) stage 3, GFR 30-59 ml/min (HCC)   Hyperlipidemia   Tobacco use disorder   Controlled type 2 diabetes mellitus with stage 3 chronic kidney disease, without long-term current use of insulin (HCC)   Weight loss   Hyperlipidemia associated with type 2 diabetes mellitus (HCC)   Anemia   Moderate protein-calorie malnutrition (HCC)   Rash   Pulmonary TB Started on RIPE therapy per ID.  Last AFB cuture (9/1) still pending, but AFB smear positive. Discussed with ID, recommend weekly AFB smear to ensure clearance and monitor trajectory.  Patient continues to make good progress in functional ability. -Continue rifampin, isoniazid, pyrazinamide, ethambutol. -Significant disease burden based on the last sputum culture.  Dysphagia Evaluated by speech therapy. MBS significant for evidence of moderate oropharyngeal dysphagia with sensorimotor deficit and hypomotility of the pharynx. Patient deemed a high aspiration risk and placed on a dysphagia 1 diet. -Initially patient was on dysphagia diet.  Currently on regular diet.  This show significant improvement in patient's deconditioning. -Speech therapy consult appreciated.  Peripheral neuropathy Concern this could be related to RIPE therapy. Started on B6.  Symptoms resolved for now.  Left atrial mobile mass Echocardiogram on 05/16/2019 showed a possible left atrial mobile mass. 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 Outpatient  cardiology follow-up.  Non-sustained V-tach Resolved. Hypokalemia and magnesium likely contributed. Biweekly labs.  Hypokalemia/Hypomagnesemia Resolved for now. Biweekly labs.  Iron deficiency anemia -Continue iron supplementation No bleeding reported.  Diabetes mellitus, type 2, controlled without any complication. -Hemoglobin A1c 6.3 on 05/16/2019. Blood sugars are well controlled. Continue SSI  Weight loss Nutrition/PT consulted  improvement in the hospital 25+ pound weight gain.  CKD stage III Stable Urinating adequately. Check labs tomorrow.  Insomnia. Trazodone at nightly as needed. We will continue it on discharge.   DVT prophylaxis: Lovenox Code Status:   Code Status: Full Code Family Communication: None Disposition Plan: Discharge pending safe discharge plan.  patient lives in a trailer alone, does not have safe amenities and will require significant assistance To live on his own.  can put others at risk for exposure.   Consultants:   ID  Procedures:   None  Antimicrobials:  Rifampin, isoniazid, pyrazinamide, ethambutol   Subjective: No nausea no vomiting.  Somewhat confused today.  No fever no chills.  Objective: Vitals:   06/15/19 1527 06/15/19 2000 06/16/19 0424 06/16/19 0434  BP: 136/84 (!) 149/86 127/73   Pulse: (!) 53 (!) 110 94   Resp: (!) 23 18 20    Temp: 98.2 F (36.8 C) 98.2 F (36.8 C) 98.2 F (36.8 C)   TempSrc: Oral Oral Oral   SpO2: (!) 82% 95% 96%   Weight:    63.1 kg  Height:        Intake/Output Summary (Last 24 hours) at 06/16/2019 1825 Last data filed at 06/16/2019 1147 Gross per 24 hour  Intake -  Output 1000 ml  Net -1000 ml   Filed Weights   06/13/19 0500 06/14/19 0500 06/16/19  0434  Weight: 65.9 kg 63.6 kg 63.1 kg    Examination:  General: alert and oriented to time, place, and person. Appear in mild distress, affect appropriate Eyes: PERRL, Conjunctiva normal ENT: Oral Mucosa Clear, moist  Neck: no  JVD, no Abnormal Mass Or lumps Cardiovascular: S1 and S2 Present, no Murmur, peripheral pulses symmetrical Respiratory: good respiratory effort, Bilateral Air entry equal and Decreased, no signs of accessory muscle use, bilateral  Crackles, no wheezes Abdomen: Bowel Sound present, Soft and no tenderness, no hernia Skin: no rashes  Extremities: no Pedal edema, no calf tenderness Neurologic: without any new focal findings Gait not checked due to patient safety concerns  Data Reviewed: I have personally reviewed following labs and imaging studies  CBC: Recent Labs  Lab 06/11/19 0535  WBC 6.1  HGB 9.0*  HCT 31.1*  MCV 83.6  PLT 230   Basic Metabolic Panel: Recent Labs  Lab 06/11/19 0535  NA 136  K 3.9  CL 96*  CO2 28  GLUCOSE 95  BUN 36*  CREATININE 1.46*  CALCIUM 8.8*   GFR: Estimated Creatinine Clearance: 40.8 mL/min (A) (by C-G formula based on SCr of 1.46 mg/dL (H)). Liver Function Tests: Recent Labs  Lab 06/14/19 0522  AST 17  ALT 12  ALKPHOS 82  BILITOT 0.4  PROT 6.7  ALBUMIN 2.6*   No results for input(s): LIPASE, AMYLASE in the last 168 hours. No results for input(s): AMMONIA in the last 168 hours. Coagulation Profile: No results for input(s): INR, PROTIME in the last 168 hours. Cardiac Enzymes: No results for input(s): CKTOTAL, CKMB, CKMBINDEX, TROPONINI in the last 168 hours. BNP (last 3 results) No results for input(s): PROBNP in the last 8760 hours. HbA1C: No results for input(s): HGBA1C in the last 72 hours. CBG: Recent Labs  Lab 06/10/19 0530 06/11/19 0432 06/12/19 0513 06/13/19 0756 06/16/19 0412  GLUCAP 91 95 84 129* 131*   Lipid Profile: No results for input(s): CHOL, HDL, LDLCALC, TRIG, CHOLHDL, LDLDIRECT in the last 72 hours. Thyroid Function Tests: No results for input(s): TSH, T4TOTAL, FREET4, T3FREE, THYROIDAB in the last 72 hours. Anemia Panel: No results for input(s): VITAMINB12, FOLATE, FERRITIN, TIBC, IRON, RETICCTPCT in  the last 72 hours. Sepsis Labs: No results for input(s): PROCALCITON, LATICACIDVEN in the last 168 hours.  No results found for this or any previous visit (from the past 240 hour(s)).       Radiology Studies: No results found.      Scheduled Meds: . buPROPion  150 mg Oral Daily  . clotrimazole   Topical BID  . enoxaparin (LOVENOX) injection  40 mg Subcutaneous Q24H  . ethambutol  1,200 mg Oral Daily  . feeding supplement (ENSURE ENLIVE)  237 mL Oral Q24H  . ferrous sulfate  325 mg Oral BID WC  . ipratropium  2 puff Inhalation TID  . isoniazid  300 mg Oral Daily   And  . vitamin B-6  50 mg Oral Daily  . loratadine  10 mg Oral Q breakfast  . mouth rinse  15 mL Mouth Rinse BID  . mometasone-formoterol  2 puff Inhalation BID  . multivitamin with minerals  1 tablet Oral Daily  . pantoprazole  40 mg Oral Q0600  . pravastatin  20 mg Oral q1800  . pyrazinamide  1,500 mg Oral Daily  . rifampin  600 mg Oral Daily  . thiamine  100 mg Oral Daily   Continuous Infusions: . sodium chloride Stopped (05/23/19 0250)  LOS: 32 days     Lynden Oxford  [facility] 06/16/2019, 6:25 PM  If 7PM-7AM, please contact night-coverage [email or web address]

## 2019-06-16 NOTE — Progress Notes (Signed)
Physical Therapy Treatment Patient Details Name: Clarence Maldonado MRN: [id number] DOB: 1946-08-17 Today's Date: 06/16/2019    History of Present Illness 73 y.o. male with medical history significant of cardiomegaly, stroke, COPD,   CKD stage III, tobacco abuse, DM 2, HTN who presented to the emergency room on 8/10 with progressive weakness over the last 3 months to the point where he was falling over. He has lost approximately 43 pounds in the past 2 months. Pt found to have Pulmonary tuberculosis with cavitation    PT Comments    Pt with elevated HR with gait and balance activities and needs therapeutic rest breaks to allow HR to decrease. O2 90-94% on room air.  Con't to work on activity tolerance with mobility and higher level balance challenges.    Follow Up Recommendations  SNF;Home health PT;Supervision/Assistance - 24 hour     Equipment Recommendations  Rolling walker with 5" wheels    Recommendations for Other Services       Precautions / Restrictions Precautions Precautions: Fall Precaution Comments: monitor O2 Restrictions Weight Bearing Restrictions: No    Mobility  Bed Mobility         Supine to sit: Supervision        Transfers Overall transfer level: Needs assistance Equipment used: Rolling walker (2 wheeled) Transfers: Sit to/from Stand;Stand Pivot Transfers Sit to Stand: Supervision Stand pivot transfers: Min assist       General transfer comment: SPT recliner > bed without AD, he had slight posterior LOB and needed MIN A   Ambulation/Gait Ambulation/Gait assistance: Supervision Gait Distance (Feet): 20 Feet(x2) Assistive device: Rolling walker (2 wheeled) Gait Pattern/deviations: Step-through pattern;Trunk flexed     General Gait Details: o2 90-94% on room air with HR up to 130 bpm.  He needed sitting rest breaks due to elevated HR.   Stairs             Wheelchair Mobility    Modified Rankin (Stroke Patients Only)        Balance           Standing balance support: No upper extremity supported Standing balance-Leahy Scale: Fair               High level balance activites: Other (comment)(reaching out of BOS and crossing midline.)              Cognition Arousal/Alertness: Awake/alert Behavior During Therapy: WFL for tasks assessed/performed Overall Cognitive Status: Within Functional Limits for tasks assessed                                        Exercises      General Comments General comments (skin integrity, edema, etc.): Pt encouraged to do shoulder rolls and worked on upright posture in standing      Pertinent Vitals/Pain Pain Assessment: No/denies pain    Home Living                      Prior Function            PT Goals (current goals can now be found in the care plan section) Acute Rehab PT Goals Patient Stated Goal: I want to get stronger so I can go home to my dog.  Time For Goal Achievement: 06/30/19 Potential to Achieve Goals: Good Progress towards PT goals: Progressing toward goals    Frequency    Min  3X/week      PT Plan Current plan remains appropriate    Co-evaluation              AM-PAC PT "6 Clicks" Mobility   Outcome Measure  Help needed turning from your back to your side while in a flat bed without using bedrails?: None Help needed moving from lying on your back to sitting on the side of a flat bed without using bedrails?: None Help needed moving to and from a bed to a chair (including a wheelchair)?: A Little Help needed standing up from a chair using your arms (e.g., wheelchair or bedside chair)?: A Little Help needed to walk in hospital room?: A Little Help needed climbing 3-5 steps with a railing? : A Little 6 Click Score: 20    End of Session Equipment Utilized During Treatment: Gait belt Activity Tolerance: Patient tolerated treatment well Patient left: in bed;with call bell/phone within reach;with  chair alarm set;Other (comment)(sitting EOB eating lunch) Nurse Communication: Other (comment)(MD informed of HR with mobility) PT Visit Diagnosis: Unsteadiness on feet (R26.81);Muscle weakness (generalized) (M62.81);Adult, failure to thrive (R62.7);History of falling (Z91.81);Repeated falls (R29.6)     Time: 9604-5409 PT Time Calculation (min) (ACUTE ONLY): 34 min  Charges:  $Gait Training: 8-22 mins $Neuromuscular Re-education: 8-22 mins                     Clarence Maldonado, Clarence Maldonado Pager [phone] 06/16/2019    Clarence Maldonado 06/16/2019, 1:24 PM

## 2019-06-16 NOTE — Plan of Care (Signed)
  Problem: Health Behavior/Discharge Planning: Goal: Ability to manage health-related needs will improve Outcome: Progressing   Problem: Activity: Goal: Risk for activity intolerance will decrease Outcome: Progressing   Problem: Safety: Goal: Ability to remain free from injury will improve Outcome: Progressing   

## 2019-06-16 NOTE — Progress Notes (Signed)
Occupational Therapy Treatment Patient Details Name: Clarence Maldonado MRN: [id number] DOB: Jan 18, 1946 Today's Date: 06/16/2019    History of present illness 73 y.o. male with medical history significant of cardiomegaly, stroke, COPD,   CKD stage III, tobacco abuse, DM 2, HTN who presented to the emergency room on 8/10 with progressive weakness over the last 3 months to the point where he was falling over. He has lost approximately 43 pounds in the past 2 months. Pt found to have Pulmonary tuberculosis with cavitation   OT comments  Focus of session was on toileting and hygiene.  Pt with 3/4 dyspnea.  Prolonged standing for hygiene, which pt did participate in.     Follow Up Recommendations  SNF;Supervision/Assistance - 24 hour    Equipment Recommendations  3 in 1 bedside commode    Recommendations for Other Services      Precautions / Restrictions Precautions Precautions: Fall Precaution Comments: monitor O2 Restrictions Weight Bearing Restrictions: No       Mobility Bed Mobility         Supine to sit: Modified independent (Device/Increase time) Sit to supine: Modified independent (Device/Increase time)      Transfers Overall transfer level: Needs assistance Equipment used: Rolling walker (2 wheeled) Transfers: Sit to/from [facility] Sit to Stand: Supervision Stand pivot transfers: Min guard;Min assist       General transfer comment: bed to [facility] to bed.  Min A to maneuver RW once due to tight quarters    Balance           Standing balance support: No upper extremity supported Standing balance-Leahy Scale: Fair               High level balance activites: Other (comment)(reaching out of BOS and crossing midline.)             ADL either performed or assessed with clinical judgement   ADL                   Upper Body Dressing : Minimal assistance Upper Body Dressing Details (indicate cue type and reason): tie gown      Toilet Transfer: Min guard;Minimal assistance;Stand-pivot;BSC(assist to maneuver RW in tight space)   Toileting- Clothing Manipulation and Hygiene: Maximal assistance;Sit to/from stand;Moderate assistance Toileting - Clothing Manipulation Details (indicate cue type and reason): pt participated; had BM in every crevice       General ADL Comments: called for toileting; had BM due to urgency. Pt very fatiqued and SOB.  Sats 94% RA; used 02 due to dyspnea, (3/4) and prolonged recovery     Vision       Perception     Praxis      Cognition Arousal/Alertness: Awake/alert Behavior During Therapy: WFL for tasks assessed/performed Overall Cognitive Status: Within Functional Limits for tasks assessed                                          Exercises     Shoulder Instructions       General Comments Pt encouraged to do shoulder rolls and worked on upright posture in standing    Pertinent Vitals/ Pain       Pain Assessment: No/denies pain  Home Living  Prior Functioning/Environment              Frequency  Min 2X/week        Progress Toward Goals  OT Goals(current goals can now be found in the care plan section)  Progress towards OT goals: Progressing toward goals  Acute Rehab OT Goals Patient Stated Goal: I want to get stronger so I can go home to my dog.   Plan      Co-evaluation                 AM-PAC OT "6 Clicks" Daily Activity     Outcome Measure   Help from another person eating meals?: None Help from another person taking care of personal grooming?: A Little Help from another person toileting, which includes using toliet, bedpan, or urinal?: A Little Help from another person bathing (including washing, rinsing, drying)?: A Little Help from another person to put on and taking off regular upper body clothing?: A Little Help from another person to put on and taking off  regular lower body clothing?: A Little 6 Click Score: 19    End of Session    OT Visit Diagnosis: Unsteadiness on feet (R26.81)   Activity Tolerance Patient tolerated treatment well   Patient Left in bed;with call bell/phone within reach;with bed alarm set   Nurse Communication          Time: 1520-1550 OT Time Calculation (min): 30 min  Charges: OT General Charges $OT Visit: 1 Visit OT Treatments $Self Care/Home Management : 23-37 mins  [name], OTR/L Acute Rehabilitation Services [phone] [facility] pager [phone] office 06/16/2019   Murat Rideout 06/16/2019, 4:05 PM

## 2019-06-17 LAB — CBC WITH DIFFERENTIAL/PLATELET
Abs Immature Granulocytes: 0.04 10*3/uL (ref 0.00–0.07)
Basophils Absolute: 0.1 10*3/uL (ref 0.0–0.1)
Basophils Relative: 2 %
Eosinophils Absolute: 0.6 10*3/uL — ABNORMAL HIGH (ref 0.0–0.5)
Eosinophils Relative: 9 %
HCT: 38.4 % — ABNORMAL LOW (ref 39.0–52.0)
Hemoglobin: 11.2 g/dL — ABNORMAL LOW (ref 13.0–17.0)
Immature Granulocytes: 1 %
Lymphocytes Relative: 20 %
Lymphs Abs: 1.4 10*3/uL (ref 0.7–4.0)
MCH: 25.4 pg — ABNORMAL LOW (ref 26.0–34.0)
MCHC: 29.2 g/dL — ABNORMAL LOW (ref 30.0–36.0)
MCV: 87.1 fL (ref 80.0–100.0)
Monocytes Absolute: 0.7 10*3/uL (ref 0.1–1.0)
Monocytes Relative: 10 %
Neutro Abs: 4.1 10*3/uL (ref 1.7–7.7)
Neutrophils Relative %: 58 %
Platelets: 285 10*3/uL (ref 150–400)
RBC: 4.41 MIL/uL (ref 4.22–5.81)
RDW: 25.6 % — ABNORMAL HIGH (ref 11.5–15.5)
WBC: 6.9 10*3/uL (ref 4.0–10.5)
nRBC: 0 % (ref 0.0–0.2)

## 2019-06-17 LAB — COMPREHENSIVE METABOLIC PANEL
ALT: 13 U/L (ref 0–44)
AST: 19 U/L (ref 15–41)
Albumin: 3 g/dL — ABNORMAL LOW (ref 3.5–5.0)
Alkaline Phosphatase: 89 U/L (ref 38–126)
Anion gap: 7 (ref 5–15)
BUN: 42 mg/dL — ABNORMAL HIGH (ref 8–23)
CO2: 27 mmol/L (ref 22–32)
Calcium: 9.1 mg/dL (ref 8.9–10.3)
Chloride: 103 mmol/L (ref 98–111)
Creatinine, Ser: 1.49 mg/dL — ABNORMAL HIGH (ref 0.61–1.24)
GFR calc Af Amer: 54 mL/min — ABNORMAL LOW (ref >60)
GFR calc non Af Amer: 46 mL/min — ABNORMAL LOW (ref >60)
Glucose, Bld: 107 mg/dL — ABNORMAL HIGH (ref 70–99)
Potassium: 4.2 mmol/L (ref 3.5–5.1)
Sodium: 137 mmol/L (ref 135–145)
Total Bilirubin: 0.6 mg/dL (ref 0.3–1.2)
Total Protein: 7.6 g/dL (ref 6.5–8.1)

## 2019-06-17 LAB — GLUCOSE, CAPILLARY: Glucose-Capillary: 142 mg/dL — ABNORMAL HIGH (ref 70–99)

## 2019-06-17 LAB — MAGNESIUM: Magnesium: 2.5 mg/dL — ABNORMAL HIGH (ref 1.7–2.4)

## 2019-06-17 LAB — ACID FAST SMEAR (AFB, MYCOBACTERIA): Acid Fast Smear: NEGATIVE

## 2019-06-17 NOTE — Progress Notes (Signed)
RN to give inhalers this morning. RT is appreciative and available if needed.

## 2019-06-17 NOTE — Plan of Care (Signed)
  Problem: Activity: Goal: Risk for activity intolerance will decrease Outcome: Progressing   Problem: Safety: Goal: Ability to remain free from injury will improve Outcome: Progressing   

## 2019-06-17 NOTE — Progress Notes (Addendum)
PROGRESS NOTE    Clarence LaosRobert M Maldonado  ZOX:[id number] DOB: [birth year] DOA: 05/15/2019 PCP: Kermit Baloeed, Tiffany L, DO   Brief Narrative: Clarence LaosRobert M Maldonado is a 73 y.o. male with history of stroke, COPD, CKD stage III, tobacco use, diabetes mellitus type 2, hypertension. Patient presented secondary to weakness and frequent falls, found to have pulmonary TB.  Assessment & Plan:   Principal Problem:   Pulmonary tuberculosis with cavitation Active Problems:   Essential hypertension   CKD (chronic kidney disease) stage 3, GFR 30-59 ml/min (HCC)   Hyperlipidemia   Tobacco use disorder   Controlled type 2 diabetes mellitus with stage 3 chronic kidney disease, without long-term current use of insulin (HCC)   Weight loss   Hyperlipidemia associated with type 2 diabetes mellitus (HCC)   Anemia   Moderate protein-calorie malnutrition (HCC)   Rash   Pulmonary TB Started on RIPE therapy per ID.  Last AFB cuture (9/1) still pending, but AFB smear positive. Discussed with ID, recommend weekly AFB smear to ensure clearance and monitor trajectory.  Patient continues to make good progress in functional ability. -Continue rifampin, isoniazid, pyrazinamide, ethambutol. -Significant disease burden based on the last sputum culture. -AFB sputum smear sent out on 06/16/2019 is actually negative.  Although it was not an early morning sample.  We will send another one on 06/18/2019.  Dysphagia Evaluated by speech therapy. MBS significant for evidence of moderate oropharyngeal dysphagia with sensorimotor deficit and hypomotility of the pharynx. Patient deemed a high aspiration risk and placed on a dysphagia 1 diet. -Initially patient was on dysphagia diet.  Currently on regular diet.  This show significant improvement in patient's deconditioning. -Speech therapy consult appreciated.  Peripheral neuropathy Concern this could be related to RIPE therapy. Started on B6.  Symptoms resolved for now.  Left atrial  mobile mass Echocardiogram on 05/16/2019 showed a possible left atrial mobile mass. 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 Outpatient cardiology follow-up.  Non-sustained V-tach Resolved. Hypokalemia and magnesium likely contributed. Biweekly labs.  Hypokalemia/Hypomagnesemia Shown again. We will replace.  Iron deficiency anemia -Continue iron supplementation No bleeding reported.  Diabetes mellitus, type 2, controlled without any complication. -Hemoglobin A1c 6.3 on 05/16/2019. Blood sugars are well controlled. Continue SSI  Weight loss Nutrition/PT consulted  improvement in the hospital 25+ pound weight gain.  CKD stage III Stable Urinating adequately. Check labs tomorrow.  Insomnia. Trazodone at nightly as needed. We will continue it on discharge.   DVT prophylaxis: Lovenox Code Status:   Code Status: Full Code Family Communication: None Disposition Plan: Discharge pending safe discharge plan.  patient lives in a trailer alone, does not have safe amenities and will require significant assistance To live on his own.  can put others at risk for exposure.   Consultants:   ID  Procedures:   None  Antimicrobials:  Rifampin, isoniazid, pyrazinamide, ethambutol   Subjective: Denies any acute complaint.  Feeling better.  Breathing better.  No nausea no vomiting.  Objective: Vitals:   06/16/19 2100 06/17/19 0445 06/17/19 0448 06/17/19 1319  BP: 129/63  132/74 136/74  Pulse: 97  95 99  Resp: 19  18 16   Temp: 98.6 F (37 C)  98.1 F (36.7 C) 98.3 F (36.8 C)  TempSrc: Oral  Oral Oral  SpO2: 97%  91% 96%  Weight:  61.9 kg    Height:        Intake/Output Summary (Last 24 hours) at 06/17/2019 1811 Last data filed at 06/17/2019 1543 Gross per  24 hour  Intake 360 ml  Output 1700 ml  Net -1340 ml   Filed Weights   06/14/19 0500 06/16/19 0434 06/17/19 0445  Weight: 63.6 kg 63.1 kg 61.9 kg    Examination:  General: alert and oriented  to time, place, and person. Appear in mild distress, affect appropriate Eyes: PERRL, Conjunctiva normal ENT: Oral Mucosa Clear, moist  Neck: no JVD, no Abnormal Mass Or lumps Cardiovascular: S1 and S2 Present, no Murmur, peripheral pulses symmetrical Respiratory: good respiratory effort, Bilateral Air entry equal and Decreased, no signs of accessory muscle use, Clear to Auscultation, Occasional  Crackles, no wheezes Abdomen: Bowel Sound present, Soft and no tenderness, no hernia Skin: no rashes  Extremities: no Pedal edema, no calf tenderness Neurologic: without any new focal findings Gait not checked due to patient safety concerns  Data Reviewed: I have personally reviewed following labs and imaging studies  CBC: Recent Labs  Lab 06/11/19 0535 06/17/19 1010  WBC 6.1 6.9  NEUTROABS  --  4.1  HGB 9.0* 11.2*  HCT 31.1* 38.4*  MCV 83.6 87.1  PLT 230 285   Basic Metabolic Panel: Recent Labs  Lab 06/11/19 0535 06/17/19 1010  NA 136 137  K 3.9 4.2  CL 96* 103  CO2 28 27  GLUCOSE 95 107*  BUN 36* 42*  CREATININE 1.46* 1.49*  CALCIUM 8.8* 9.1  MG  --  2.5*   GFR: Estimated Creatinine Clearance: 39.2 mL/min (A) (by C-G formula based on SCr of 1.49 mg/dL (H)). Liver Function Tests: Recent Labs  Lab 06/14/19 0522 06/17/19 1010  AST 17 19  ALT 12 13  ALKPHOS 82 89  BILITOT 0.4 0.6  PROT 6.7 7.6  ALBUMIN 2.6* 3.0*   No results for input(s): LIPASE, AMYLASE in the last 168 hours. No results for input(s): AMMONIA in the last 168 hours. Coagulation Profile: No results for input(s): INR, PROTIME in the last 168 hours. Cardiac Enzymes: No results for input(s): CKTOTAL, CKMB, CKMBINDEX, TROPONINI in the last 168 hours. BNP (last 3 results) No results for input(s): PROBNP in the last 8760 hours. HbA1C: No results for input(s): HGBA1C in the last 72 hours. CBG: Recent Labs  Lab 06/11/19 0432 06/12/19 0513 06/13/19 0756 06/16/19 0412 06/17/19 0737  GLUCAP 95 84 129*  131* 142*   Lipid Profile: No results for input(s): CHOL, HDL, LDLCALC, TRIG, CHOLHDL, LDLDIRECT in the last 72 hours. Thyroid Function Tests: No results for input(s): TSH, T4TOTAL, FREET4, T3FREE, THYROIDAB in the last 72 hours. Anemia Panel: No results for input(s): VITAMINB12, FOLATE, FERRITIN, TIBC, IRON, RETICCTPCT in the last 72 hours. Sepsis Labs: No results for input(s): PROCALCITON, LATICACIDVEN in the last 168 hours.  Recent Results (from the past 240 hour(s))  Acid Fast Smear (AFB)     Status: None   Collection Time: 06/16/19 11:07 AM   Specimen: Sputum  Result Value Ref Range Status   AFB Specimen Processing Concentration  Final   Acid Fast Smear Negative  Final    Comment: (NOTE) Performed At: [facility] [address] [place], [place] [id number] Jolene Schimke MD YE:[id number]    Source (AFB) SPUTUM  Final    Comment: Performed at [facility], [address] [place], [place] [address]         Radiology Studies: No results found.      Scheduled Meds: . buPROPion  150 mg Oral Daily  . clotrimazole   Topical BID  . enoxaparin (LOVENOX) injection  40 mg Subcutaneous  Q24H  . ethambutol  1,200 mg Oral Daily  . feeding supplement (ENSURE ENLIVE)  237 mL Oral Q24H  . ferrous sulfate  325 mg Oral BID WC  . ipratropium  2 puff Inhalation TID  . isoniazid  300 mg Oral Daily   And  . vitamin B-6  50 mg Oral Daily  . loratadine  10 mg Oral Q breakfast  . mouth rinse  15 mL Mouth Rinse BID  . mometasone-formoterol  2 puff Inhalation BID  . multivitamin with minerals  1 tablet Oral Daily  . pantoprazole  40 mg Oral Q0600  . pravastatin  20 mg Oral q1800  . pyrazinamide  1,500 mg Oral Daily  . rifampin  600 mg Oral Daily  . thiamine  100 mg Oral Daily   Continuous Infusions: . sodium chloride Stopped (05/23/19 0250)     LOS: 33 days     Kairen Hallinan  [facility] 06/17/2019, 6:11 PM  If 7PM-7AM, please contact  night-coverage [email or web address]

## 2019-06-18 NOTE — Plan of Care (Signed)
  Problem: Activity: Goal: Risk for activity intolerance will decrease Outcome: Progressing   Problem: Safety: Goal: Ability to remain free from injury will improve Outcome: Progressing   

## 2019-06-18 NOTE — Progress Notes (Signed)
   06/18/19 2015  MEWS Assessment  Is this an acute change? No  No change, from previous vs runs as high as one teens

## 2019-06-18 NOTE — Progress Notes (Signed)
PROGRESS NOTE    Clarence Maldonado  IWL:[id number] DOB: [birth year] DOA: 05/15/2019 PCP: Gayland Curry, DO   Brief Narrative: Clarence Maldonado is a 73 y.o. male with history of stroke, COPD, CKD stage III, tobacco use, diabetes mellitus type 2, hypertension. Patient presented secondary to weakness and frequent falls, found to have pulmonary TB.  Assessment & Plan:   Principal Problem:   Pulmonary tuberculosis with cavitation Active Problems:   Essential hypertension   CKD (chronic kidney disease) stage 3, GFR 30-59 ml/min (HCC)   Hyperlipidemia   Tobacco use disorder   Controlled type 2 diabetes mellitus with stage 3 chronic kidney disease, without long-term current use of insulin (HCC)   Weight loss   Hyperlipidemia associated with type 2 diabetes mellitus (HCC)   Anemia   Moderate protein-calorie malnutrition (HCC)   Rash   Pulmonary TB Started on RIPE therapy per ID.  Last AFB cuture (9/1) still pending, but AFB smear positive. Discussed with ID, recommend weekly AFB smear to ensure clearance and monitor trajectory.  Patient continues to make good progress in functional ability. -Continue rifampin, isoniazid, pyrazinamide, ethambutol. -Significant disease burden based on the last sputum culture. -AFB sputum smear sent out on 06/16/2019 is actually negative.  Although it was not an early morning sample.  Repeat AFB sent on 06/18/2019, will monitor result Still has cough, monitor and treat symptomatically   Dysphagia Evaluated by speech therapy. MBS significant for evidence of moderate oropharyngeal dysphagia with sensorimotor deficit and hypomotility of the pharynx. Patient deemed a high aspiration risk and placed on a dysphagia 1 diet. -Initially patient was on dysphagia diet.  Currently on regular diet.  This show significant improvement in patient's deconditioning. -Speech therapy consult appreciated.  Peripheral neuropathy Concern this could be related to RIPE  therapy. Started on B6.  Symptoms resolved for now.  Left atrial mobile mass Echocardiogram on 05/16/2019 showed a possible left atrial mobile mass. 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 Outpatient cardiology follow-up.  Non-sustained V-tach Resolved. Hypokalemia and magnesium likely contributed. Biweekly labs.  Hypokalemia/Hypomagnesemia Shown again. We will replace.  Iron deficiency anemia -Continue iron supplementation No bleeding reported.  Diabetes mellitus, type 2, controlled without any complication. -Hemoglobin A1c 6.3 on 05/16/2019. Blood sugars are well controlled. Continue SSI  Weight loss Nutrition/PT consulted  improvement in the hospital 25+ pound weight gain.  CKD stage III Stable Urinating adequately. Check labs tomorrow.  Insomnia. Trazodone at nightly as needed. We will continue it on discharge.   DVT prophylaxis: Lovenox Code Status:   Code Status: Full Code Family Communication: None Disposition Plan: Discharge pending safe discharge plan.  patient lives in a trailer alone, does not have safe amenities and will require significant assistance To live on his own.  can put others at risk for exposure.   Consultants:   ID  Procedures:   None  Antimicrobials:  Rifampin, isoniazid, pyrazinamide, ethambutol   Subjective: Complains of dry cough.  No nausea no vomiting no fever no chills.  No chest pain abdominal pain.  No other acute events.  Objective: Vitals:   06/18/19 0550 06/18/19 0835 06/18/19 1255 06/18/19 1321  BP: 131/73  136/86   Pulse: 95 96 (!) 121 100  Resp: 16 16 16 16   Temp: 98.5 F (36.9 C)  98.9 F (37.2 C)   TempSrc: Oral  Oral   SpO2: 93% 96% 96% 93%  Weight: 63.1 kg     Height:  Intake/Output Summary (Last 24 hours) at 06/18/2019 1349 Last data filed at 06/18/2019 1129 Gross per 24 hour  Intake 90 ml  Output 1002 ml  Net -912 ml   Filed Weights   06/16/19 0434 06/17/19 0445 06/18/19  0550  Weight: 63.1 kg 61.9 kg 63.1 kg   Examination:  General: Appear in mild distress, no Rash; Oral Mucosa Clear, moist. no Abnormal Mass Or lumps Cardiovascular: S1 and S2 Present, no Murmur, Respiratory: normal respiratory effort, Bilateral Air entry present and bilateral  Crackles, no wheezes Abdomen: Bowel Sound present, Soft and no tenderness, no hernia Extremities: no Pedal edema, no calf tenderness Neurology: alert and oriented to time, place, and person affect appropriat  Data Reviewed: I have personally reviewed following labs and imaging studies  CBC: Recent Labs  Lab 06/17/19 1010  WBC 6.9  NEUTROABS 4.1  HGB 11.2*  HCT 38.4*  MCV 87.1  PLT 285   Basic Metabolic Panel: Recent Labs  Lab 06/17/19 1010  NA 137  K 4.2  CL 103  CO2 27  GLUCOSE 107*  BUN 42*  CREATININE 1.49*  CALCIUM 9.1  MG 2.5*   GFR: Estimated Creatinine Clearance: 40 mL/min (A) (by C-G formula based on SCr of 1.49 mg/dL (H)). Liver Function Tests: Recent Labs  Lab 06/14/19 0522 06/17/19 1010  AST 17 19  ALT 12 13  ALKPHOS 82 89  BILITOT 0.4 0.6  PROT 6.7 7.6  ALBUMIN 2.6* 3.0*   No results for input(s): LIPASE, AMYLASE in the last 168 hours. No results for input(s): AMMONIA in the last 168 hours. Coagulation Profile: No results for input(s): INR, PROTIME in the last 168 hours. Cardiac Enzymes: No results for input(s): CKTOTAL, CKMB, CKMBINDEX, TROPONINI in the last 168 hours. BNP (last 3 results) No results for input(s): PROBNP in the last 8760 hours. HbA1C: No results for input(s): HGBA1C in the last 72 hours. CBG: Recent Labs  Lab 06/12/19 0513 06/13/19 0756 06/16/19 0412 06/17/19 0737  GLUCAP 84 129* 131* 142*   Lipid Profile: No results for input(s): CHOL, HDL, LDLCALC, TRIG, CHOLHDL, LDLDIRECT in the last 72 hours. Thyroid Function Tests: No results for input(s): TSH, T4TOTAL, FREET4, T3FREE, THYROIDAB in the last 72 hours. Anemia Panel: No results for  input(s): VITAMINB12, FOLATE, FERRITIN, TIBC, IRON, RETICCTPCT in the last 72 hours. Sepsis Labs: No results for input(s): PROCALCITON, LATICACIDVEN in the last 168 hours.  Recent Results (from the past 240 hour(s))  Acid Fast Smear (AFB)     Status: None   Collection Time: 06/16/19 11:07 AM   Specimen: Sputum  Result Value Ref Range Status   AFB Specimen Processing Concentration  Final   Acid Fast Smear Negative  Final    Comment: (NOTE) Performed At: [facility] [place] [address] [place], [place] [id number] Jolene SchimkeNagendra Sanjai MD NW:[id number]:[phone]    Source (AFB) SPUTUM  Final    Comment: Performed at [facility], [address] [place], [place] [address]   Radiology Studies: No results found.  Scheduled Meds:  buPROPion  150 mg Oral Daily   clotrimazole   Topical BID   enoxaparin (LOVENOX) injection  40 mg Subcutaneous Q24H   ethambutol  1,200 mg Oral Daily   feeding supplement (ENSURE ENLIVE)  237 mL Oral Q24H   ferrous sulfate  325 mg Oral BID WC   ipratropium  2 puff Inhalation TID   isoniazid  300 mg Oral Daily   And   vitamin B-6  50 mg Oral Daily  loratadine  10 mg Oral Q breakfast   mouth rinse  15 mL Mouth Rinse BID   mometasone-formoterol  2 puff Inhalation BID   multivitamin with minerals  1 tablet Oral Daily   pantoprazole  40 mg Oral Q0600   pravastatin  20 mg Oral q1800   pyrazinamide  1,500 mg Oral Daily   rifampin  600 mg Oral Daily   thiamine  100 mg Oral Daily   Continuous Infusions:  sodium chloride Stopped (05/23/19 0250)     LOS: 34 days     Author:  Lynden Oxford, MD [facility] 06/18/2019  1:54 PM   To reach On-call, see care teams to locate the attending and reach out to them via [email or web address]. If 7PM-7AM, please contact night-coverage If you still have difficulty reaching the attending provider, please page the [facility] (Director on Call) for [facility] on amion for assistance.

## 2019-06-19 LAB — CBC
HCT: 39.2 % (ref 39.0–52.0)
Hemoglobin: 11.7 g/dL — ABNORMAL LOW (ref 13.0–17.0)
MCH: 26 pg (ref 26.0–34.0)
MCHC: 29.8 g/dL — ABNORMAL LOW (ref 30.0–36.0)
MCV: 87.1 fL (ref 80.0–100.0)
Platelets: 281 10*3/uL (ref 150–400)
RBC: 4.5 MIL/uL (ref 4.22–5.81)
RDW: 25.8 % — ABNORMAL HIGH (ref 11.5–15.5)
WBC: 6.9 10*3/uL (ref 4.0–10.5)
nRBC: 0 % (ref 0.0–0.2)

## 2019-06-19 LAB — BASIC METABOLIC PANEL
Anion gap: 11 (ref 5–15)
BUN: 37 mg/dL — ABNORMAL HIGH (ref 8–23)
CO2: 23 mmol/L (ref 22–32)
Calcium: 9 mg/dL (ref 8.9–10.3)
Chloride: 103 mmol/L (ref 98–111)
Creatinine, Ser: 1.63 mg/dL — ABNORMAL HIGH (ref 0.61–1.24)
GFR calc Af Amer: 48 mL/min — ABNORMAL LOW (ref >60)
GFR calc non Af Amer: 41 mL/min — ABNORMAL LOW (ref >60)
Glucose, Bld: 148 mg/dL — ABNORMAL HIGH (ref 70–99)
Potassium: 4.5 mmol/L (ref 3.5–5.1)
Sodium: 137 mmol/L (ref 135–145)

## 2019-06-19 LAB — MAGNESIUM: Magnesium: 2.5 mg/dL — ABNORMAL HIGH (ref 1.7–2.4)

## 2019-06-19 LAB — ACID FAST SMEAR (AFB, MYCOBACTERIA): Acid Fast Smear: POSITIVE — AB

## 2019-06-19 MED ORDER — FAMOTIDINE 20 MG PO TABS
20.0000 mg | ORAL_TABLET | Freq: Every day | ORAL | Status: DC
  Administered 2019-06-19 – 2019-07-25 (×37): 20 mg via ORAL
  Filled 2019-06-19 (×37): qty 1

## 2019-06-19 MED ORDER — SODIUM CHLORIDE 0.9 % IV SOLN
INTRAVENOUS | Status: AC
  Administered 2019-06-19: 09:00:00 via INTRAVENOUS

## 2019-06-19 NOTE — Progress Notes (Signed)
Physical Therapy Treatment Patient Details Name: Clarence Maldonado MRN: [id number] DOB: 01/11/1946 Today's Date: 06/19/2019    History of Present Illness 73 y.o. male with medical history significant of cardiomegaly, stroke, COPD,   CKD stage III, tobacco abuse, DM 2, HTN who presented to the emergency room on 8/10 with progressive weakness over the last 3 months to the point where he was falling over. He has lost approximately 43 pounds in the past 2 months. Pt found to have Pulmonary tuberculosis with cavitation    PT Comments    Progressing with mobility. SpO2: 88-91% on RA, HR up to 124 bpm with ambulation on today.    Follow Up Recommendations  SNF;Home health PT;Supervision/Assistance - 24 hour(likely home)     Equipment Recommendations  Rolling walker with 5" wheels    Recommendations for Other Services       Precautions / Restrictions Precautions Precautions: Fall Precaution Comments: monitor O2 Restrictions Weight Bearing Restrictions: No    Mobility  Bed Mobility Overal bed mobility: Modified Independent                Transfers Overall transfer level: Needs assistance Equipment used: Rolling walker (2 wheeled) Transfers: Sit to/from Stand Sit to Stand: Supervision         General transfer comment: for safety. VCs hand placement.  Ambulation/Gait Ambulation/Gait assistance: Supervision Gait Distance (Feet): 20 Feet(x2) Assistive device: Rolling walker (2 wheeled) Gait Pattern/deviations: Step-through pattern;Trunk flexed     General Gait Details: SpO2: 88-91% on RA with ambulation, HR 124 bpm. Seated rest break between walks.   Stairs             Wheelchair Mobility    Modified Rankin (Stroke Patients Only)       Balance Overall balance assessment: Needs assistance           Standing balance-Leahy Scale: Fair                              Cognition Arousal/Alertness: Awake/alert Behavior During Therapy: WFL  for tasks assessed/performed Overall Cognitive Status: Within Functional Limits for tasks assessed                                        Exercises      General Comments        Pertinent Vitals/Pain Pain Assessment: No/denies pain    Home Living                      Prior Function            PT Goals (current goals can now be found in the care plan section) Progress towards PT goals: Progressing toward goals    Frequency    Min 3X/week      PT Plan Current plan remains appropriate    Co-evaluation              AM-PAC PT "6 Clicks" Mobility   Outcome Measure  Help needed turning from your back to your side while in a flat bed without using bedrails?: None Help needed moving from lying on your back to sitting on the side of a flat bed without using bedrails?: None Help needed moving to and from a bed to a chair (including a wheelchair)?: A Little Help needed standing up from a chair using your  arms (e.g., wheelchair or bedside chair)?: A Little Help needed to walk in hospital room?: A Little Help needed climbing 3-5 steps with a railing? : A Little 6 Click Score: 20    End of Session Equipment Utilized During Treatment: Gait belt Activity Tolerance: Patient tolerated treatment well Patient left: in bed;with call bell/phone within reach;with bed alarm set   PT Visit Diagnosis: Unsteadiness on feet (R26.81);Muscle weakness (generalized) (M62.81);Adult, failure to thrive (R62.7);History of falling (Z91.81);Repeated falls (R29.6)     Time: 6269-4854 PT Time Calculation (min) (ACUTE ONLY): 23 min  Charges:  $Gait Training: 23-37 mins                        Weston Anna, PT Acute Rehabilitation Services Pager: [phone] Office: [phone]

## 2019-06-19 NOTE — Progress Notes (Signed)
Nutrition Follow-up  RD working remotely.   DOCUMENTATION CODES:   Not applicable  INTERVENTION:  - continue Ensure Enlive once/day (350 kcal, 20 grams protein) and Magic Cup BID (290 kcal, 9 grams protein each). - continue to encourage PO intakes.    NUTRITION DIAGNOSIS:   Inadequate oral intake related to poor appetite as evidenced by per patient/family report. -resolved; no new nutrition dx.   GOAL:   Patient will meet greater than or equal to 90% of their needs -met  MONITOR:   PO intake, Supplement acceptance, Labs, Weight trends, I & O's  ASSESSMENT:   73 y.o. male with medical history significant of cardiomegaly, stroke, COPD,   CKD stage III, tobacco abuse, DM 2, HTN,  Admitted for cavitary lesions unclear etiology work-up for TB versus malignancy  Weight has been fairly stable the past 2 weeks. Per flow sheet, patient consumed 100% of breakfast and lunch on 9/10 (total of 1414 kcal, 48 grams protein); 100% of breakfast on 9/11 (882 kcal, 33 grams protein); 100% of breakfast on 9/12 (828 kcal, 25 grams protein); 100% of breakfast and lunch on 9/13 (total of 1528 kcal, 68 grams protein). He has been eating 100% of magic cup when he receives them and has accepted Ensure ~90% of the time offered since order placed on 8/19.   Per notes: - TB and started on RIPE therapy--ongoing cough - improvement in swallowing ability with patient now on Regular diet (previously on Dysphagia 1 and then Dysphagia 3) - peripheral neuropathy - L atrial mobile mass--plan for outpatient Cardiology follow-up - hypomagnesemia--resolved and slightly hyperMg after repletion - iron deficiency anemia - DM with HgbA1c of 6.3% on 05/16/19 - insomnia - d/c pending safe discharge plan as patient lives alone in a trailer and does not have safe amenities and will need significant assistance in order to live on his own   Labs reviewed; BUN: 37 mg/dl, creatinine: 1.63 mg/dl, Mg: 2.5 mg/dl, GFR: 41  ml/min. Medications reviewed; 20 mg pepcid/day, 325 mg ferrous sulfate BID, 50 mg vitamin B6/day, daily multivitamin with minerals, 100 mg thiamine/day.      NUTRITION - FOCUSED PHYSICAL EXAM:  unable to complete for patient on airborne precautions.   Diet Order:   Diet Order            Diet regular Room service appropriate? Yes; Fluid consistency: Thin  Diet effective now              EDUCATION NEEDS:   No education needs have been identified at this time  Skin:  Skin Assessment: Reviewed RN Assessment  Last BM:  9/13  Height:   Ht Readings from Last 1 Encounters:  05/16/19 _0  (1.676 m)    Weight:   Wt Readings from Last 1 Encounters:  06/19/19 62.7 kg    Ideal Body Weight:  64.5 kg  BMI:  Body mass index is 22.31 kg/m.  Estimated Nutritional Needs:   Kcal:  1600-1800  Protein:  70-80g  Fluid:  1.8L/day      Jarome Matin, MS, RD, LDN, CNSC Inpatient Clinical Dietitian Pager # [phone] After hours/weekend pager # [phone]

## 2019-06-19 NOTE — Progress Notes (Signed)
PROGRESS NOTE    Clarence Maldonado  BUL:[id number] DOB: 04-25-1946 DOA: 05/15/2019 PCP: Gayland Curry, DO   Brief Narrative: Clarence Maldonado is a 73 y.o. male with history of stroke, COPD, CKD stage III, tobacco use, diabetes mellitus type 2, hypertension. Patient presented secondary to weakness and frequent falls, found to have pulmonary TB.  Assessment & Plan:   Principal Problem:   Pulmonary tuberculosis with cavitation Active Problems:   Essential hypertension   CKD (chronic kidney disease) stage 3, GFR 30-59 ml/min (HCC)   Hyperlipidemia   Tobacco use disorder   Controlled type 2 diabetes mellitus with stage 3 chronic kidney disease, without long-term current use of insulin (HCC)   Weight loss   Hyperlipidemia associated with type 2 diabetes mellitus (HCC)   Anemia   Moderate protein-calorie malnutrition (HCC)   Rash   Pulmonary TB Started on RIPE therapy per ID.  Last AFB cuture (9/1) still pending, but AFB smear positive. Discussed with ID, recommend weekly AFB smear to ensure clearance and monitor trajectory.  Patient continues to make good progress in functional ability. -Continue rifampin, isoniazid, pyrazinamide, ethambutol. -Significant disease burden based on the last sputum culture. -AFB sputum smear sent out on 06/16/2019 is actually negative.  Although it was not an early morning sample.  Repeat AFB sent on 06/18/2019.  Currently that specimen is positive.  Remain in isolation.  Acute kidney injury.  On chronic kidney disease stage III Likely from poor p.o. intake. We will hydrate and recheck the labs tomorrow morning.  If no improvement will require further work-up.  Dysphagia Evaluated by speech therapy. MBS significant for evidence of moderate oropharyngeal dysphagia with sensorimotor deficit and hypomotility of the pharynx. Patient deemed a high aspiration risk and placed on a dysphagia 1 diet. -Initially patient was on dysphagia diet.  Currently on  regular diet.  This show significant improvement in patient's deconditioning. -Speech therapy consult appreciated.  Peripheral neuropathy Concern this could be related to RIPE therapy. Started on B6.  Symptoms resolved for now.  Left atrial mobile mass Echocardiogram on 05/16/2019 showed a possible left atrial mobile mass. 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 Outpatient cardiology follow-up.  Non-sustained V-tach Resolved. Hypokalemia and magnesium likely contributed. Biweekly labs.  Hypokalemia/Hypomagnesemia Shown again. We will replace.  Iron deficiency anemia -Continue iron supplementation No bleeding reported.  Diabetes mellitus, type 2, controlled without any complication. -Hemoglobin A1c 6.3 on 05/16/2019. Blood sugars are well controlled. Continue SSI  Weight loss Nutrition/PT consulted  improvement in the hospital 25+ pound weight gain.  Insomnia. Trazodone at nightly as needed. We will continue it on discharge.   DVT prophylaxis: Lovenox Code Status:   Code Status: Full Code Family Communication: None Disposition Plan: Discharge pending safe discharge plan.  patient lives in a trailer alone, does not have safe amenities and will require significant assistance To live on his own.  can put others at risk for exposure.   Consultants:   ID  Procedures:   None  Antimicrobials:  Rifampin, isoniazid, pyrazinamide, ethambutol   Subjective: No nausea no vomiting no fever no chills.  Still has some cough.  No diarrhea  Objective: Vitals:   06/18/19 2011 06/19/19 0433 06/19/19 0447 06/19/19 1615  BP: 133/71 140/80  140/78  Pulse: (!) 114 92  99  Resp: 20 20  20   Temp: 98.5 F (36.9 C) 98 F (36.7 C)  97.8 F (36.6 C)  TempSrc: Oral Oral  Oral  SpO2: 93% 96%  93%  Weight:   62.7 kg   Height:        Intake/Output Summary (Last 24 hours) at 06/19/2019 1807 Last data filed at 06/19/2019 1615 Gross per 24 hour  Intake 453.75 ml   Output 850 ml  Net -396.25 ml   Filed Weights   06/17/19 0445 06/18/19 0550 06/19/19 0447  Weight: 61.9 kg 63.1 kg 62.7 kg   Examination:  General: Appear in no distress, no Rash; Oral Mucosa Clear, moist. no Abnormal Mass Or lumps Cardiovascular: S1 and S2 Present, no Murmur, Respiratory: normal respiratory effort, Bilateral Air entry present bilateral  Crackles, no wheezes Abdomen: Bowel Sound present, Soft and no tenderness, no hernia Extremities: no Pedal edema, no calf tenderness Neurology: alert and oriented to time, place, and person affect appropriate.  Data Reviewed: I have personally reviewed following labs and imaging studies  CBC: Recent Labs  Lab 06/17/19 1010 06/19/19 0530  WBC 6.9 6.9  NEUTROABS 4.1  --   HGB 11.2* 11.7*  HCT 38.4* 39.2  MCV 87.1 87.1  PLT 285 281   Basic Metabolic Panel: Recent Labs  Lab 06/17/19 1010 06/19/19 0530  NA 137 137  K 4.2 4.5  CL 103 103  CO2 27 23  GLUCOSE 107* 148*  BUN 42* 37*  CREATININE 1.49* 1.63*  CALCIUM 9.1 9.0  MG 2.5* 2.5*   GFR: Estimated Creatinine Clearance: 36.3 mL/min (A) (by C-G formula based on SCr of 1.63 mg/dL (H)). Liver Function Tests: Recent Labs  Lab 06/14/19 0522 06/17/19 1010  AST 17 19  ALT 12 13  ALKPHOS 82 89  BILITOT 0.4 0.6  PROT 6.7 7.6  ALBUMIN 2.6* 3.0*   No results for input(s): LIPASE, AMYLASE in the last 168 hours. No results for input(s): AMMONIA in the last 168 hours. Coagulation Profile: No results for input(s): INR, PROTIME in the last 168 hours. Cardiac Enzymes: No results for input(s): CKTOTAL, CKMB, CKMBINDEX, TROPONINI in the last 168 hours. BNP (last 3 results) No results for input(s): PROBNP in the last 8760 hours. HbA1C: No results for input(s): HGBA1C in the last 72 hours. CBG: Recent Labs  Lab 06/13/19 0756 06/16/19 0412 06/17/19 0737  GLUCAP 129* 131* 142*   Lipid Profile: No results for input(s): CHOL, HDL, LDLCALC, TRIG, CHOLHDL, LDLDIRECT  in the last 72 hours. Thyroid Function Tests: No results for input(s): TSH, T4TOTAL, FREET4, T3FREE, THYROIDAB in the last 72 hours. Anemia Panel: No results for input(s): VITAMINB12, FOLATE, FERRITIN, TIBC, IRON, RETICCTPCT in the last 72 hours. Sepsis Labs: No results for input(s): PROCALCITON, LATICACIDVEN in the last 168 hours.  Recent Results (from the past 240 hour(s))  Acid Fast Smear (AFB)     Status: None   Collection Time: 06/16/19 11:07 AM   Specimen: Sputum  Result Value Ref Range Status   AFB Specimen Processing Concentration  Final   Acid Fast Smear Negative  Final    Comment: (NOTE) Performed At: [facility] [address] [place], [place] [id number] Jolene Schimke MD GU:[id number]    Source (AFB) SPUTUM  Final    Comment: Performed at [facility], [address], [place], [place] [address]  Acid Fast Smear (AFB)     Status: Abnormal   Collection Time: 06/18/19  5:00 AM   Specimen: Sputum  Result Value Ref Range Status   AFB Specimen Processing Concentration  Final   Acid Fast Smear Positive (A)  Final    Comment: (NOTE) 3+, 4-36 acid-fast bacilli per field  at 400X magnification, fluorescent smear POSITIVE SMEAR REPORTED TO Clarence Maldonado AT 1018 ON 06/19/19 BY AM. Performed At: [facility] [place] [address] [place], [place] [id number] Jolene SchimkeNagendra Sanjai MD NW:[id number]:[phone]    Source (AFB) SPUTUM  Final    Comment: Performed at [facility], [address] [place], [place] [address]   Radiology Studies: No results found.  Scheduled Meds: . buPROPion  150 mg Oral Daily  . clotrimazole   Topical BID  . enoxaparin (LOVENOX) injection  40 mg Subcutaneous Q24H  . ethambutol  1,200 mg Oral Daily  . famotidine  20 mg Oral Daily  . feeding supplement (ENSURE ENLIVE)  237 mL Oral Q24H  . ferrous sulfate  325 mg Oral BID WC  . ipratropium  2 puff Inhalation TID  . isoniazid  300 mg Oral Daily   And  . vitamin  B-6  50 mg Oral Daily  . loratadine  10 mg Oral Q breakfast  . mouth rinse  15 mL Mouth Rinse BID  . mometasone-formoterol  2 puff Inhalation BID  . multivitamin with minerals  1 tablet Oral Daily  . pravastatin  20 mg Oral q1800  . pyrazinamide  1,500 mg Oral Daily  . rifampin  600 mg Oral Daily  . thiamine  100 mg Oral Daily   Continuous Infusions: . sodium chloride Stopped (05/23/19 0250)  . sodium chloride 75 mL/hr at 06/19/19 0845     LOS: 35 days     Author:  Lynden OxfordPranav Patel, MD [facility] 06/19/2019  6:07 PM   To reach On-call, see care teams to locate the attending and reach out to them via [email or web address]. If 7PM-7AM, please contact night-coverage If you still have difficulty reaching the attending provider, please page the [facility] (Director on Call) for [facility] on amion for assistance.

## 2019-06-19 NOTE — Progress Notes (Signed)
Patient ID: Clarence Maldonado, male   DOB: 1945-10-15, 73 y.o.   MRN: [id number]         [facility]  Date of Admission:  05/15/2019           Day 32 TB therapy (RIPE) ASSESSMENT: He is improving slowly.  The sputum AFB culture obtained on 06/06/2019 remains negative but repeat sputum AFB smear yesterday was still 3+ positive.  I spoke to the health department nurse last week who informed me that his M tb isolate is sensitive to all 4 of his current antibiotics.  She requested that we obtain 2 sputum specimens for AFB smear and culture every 2 weeks.  PLAN: 1. Continue RIPE 2. I will follow-up in 1 week  Principal Problem:   Pulmonary tuberculosis with cavitation Active Problems:   Rash   Essential hypertension   CKD (chronic kidney disease) stage 3, GFR 30-59 ml/min (HCC)   Hyperlipidemia   Tobacco use disorder   Controlled type 2 diabetes mellitus with stage 3 chronic kidney disease, without long-term current use of insulin (HCC)   Weight loss   Hyperlipidemia associated with type 2 diabetes mellitus (HCC)   Anemia   Moderate protein-calorie malnutrition (HCC)   Scheduled Meds: . buPROPion  150 mg Oral Daily  . clotrimazole   Topical BID  . enoxaparin (LOVENOX) injection  40 mg Subcutaneous Q24H  . ethambutol  1,200 mg Oral Daily  . famotidine  20 mg Oral Daily  . feeding supplement (ENSURE ENLIVE)  237 mL Oral Q24H  . ferrous sulfate  325 mg Oral BID WC  . ipratropium  2 puff Inhalation TID  . isoniazid  300 mg Oral Daily   And  . vitamin B-6  50 mg Oral Daily  . loratadine  10 mg Oral Q breakfast  . mouth rinse  15 mL Mouth Rinse BID  . mometasone-formoterol  2 puff Inhalation BID  . multivitamin with minerals  1 tablet Oral Daily  . pravastatin  20 mg Oral q1800  . pyrazinamide  1,500 mg Oral Daily  . rifampin  600 mg Oral Daily  . thiamine  100 mg Oral Daily   Continuous Infusions: . sodium chloride Stopped (05/23/19 0250)  . sodium  chloride 75 mL/hr at 06/19/19 0845   PRN Meds:.sodium chloride, acetaminophen **OR** acetaminophen, alum & mag hydroxide-simeth, dextrose, HYDROcodone-acetaminophen, hydrocortisone cream, levalbuterol, loperamide, magic mouthwash w/lidocaine, ondansetron **OR** ondansetron (ZOFRAN) IV, phenol, Resource ThickenUp Clear, traZODone   SUBJECTIVE: He is feeling much better.  He still gets short of breath when up walking but his room air oxygen saturation obtained by PT has been better recently.  He is not coughing as much.  Review of Systems: Review of Systems  Constitutional: Positive for malaise/fatigue and weight loss. Negative for chills, diaphoresis and fever.  Respiratory: Positive for cough, sputum production and shortness of breath. Negative for hemoptysis.   Cardiovascular: Negative for chest pain.  Gastrointestinal: Negative for abdominal pain, diarrhea, nausea and vomiting.    No Known Allergies  OBJECTIVE: Vitals:   06/18/19 1321 06/18/19 2011 06/19/19 0433 06/19/19 0447  BP:  133/71 140/80   Pulse: 100 (!) 114 92   Resp: 16 20 20    Temp:  98.5 F (36.9 C) 98 F (36.7 C)   TempSrc:  Oral Oral   SpO2: 93% 93% 96%   Weight:    62.7 kg  Height:       Body mass index is 22.31 kg/m.  Physical  Exam Constitutional:      Comments: He is talkative and in good spirits.    Cardiovascular:     Rate and Rhythm: Normal rate and regular rhythm.     Heart sounds: No murmur.  Pulmonary:     Effort: Pulmonary effort is normal.     Breath sounds: Rhonchi present. No wheezing or rales.  Abdominal:     Palpations: Abdomen is soft.     Tenderness: There is no abdominal tenderness.  Skin:    Findings: No erythema.  Psychiatric:        Mood and Affect: Mood normal.     Lab Results Lab Results  Component Value Date   WBC 6.9 06/19/2019   HGB 11.7 (L) 06/19/2019   HCT 39.2 06/19/2019   MCV 87.1 06/19/2019   PLT 281 06/19/2019    Lab Results  Component Value Date    CREATININE 1.63 (H) 06/19/2019   BUN 37 (H) 06/19/2019   NA 137 06/19/2019   K 4.5 06/19/2019   CL 103 06/19/2019   CO2 23 06/19/2019    Lab Results  Component Value Date   ALT 13 06/17/2019   AST 19 06/17/2019   ALKPHOS 89 06/17/2019   BILITOT 0.6 06/17/2019     Microbiology: Recent Results (from the past 240 hour(s))  Acid Fast Smear (AFB)     Status: None   Collection Time: 06/16/19 11:07 AM   Specimen: Sputum  Result Value Ref Range Status   AFB Specimen Processing Concentration  Final   Acid Fast Smear Negative  Final    Comment: (NOTE) Performed At: [facility] [address] [place], [place] [id number] Rush Farmer MD EZ:[id number]    Source (AFB) SPUTUM  Final    Comment: Performed at [facility], [place] [address] [place], [place] [address]  Acid Fast Smear (AFB)     Status: Abnormal   Collection Time: 06/18/19  5:00 AM   Specimen: Sputum  Result Value Ref Range Status   AFB Specimen Processing Concentration  Final   Acid Fast Smear Positive (A)  Final    Comment: (NOTE) 3+, 4-36 acid-fast bacilli per field at 400X magnification, fluorescent smear POSITIVE SMEAR REPORTED TO Floyce Stakes AT 6568 ON 06/19/19 BY AM. Performed At: [facility] [place], [place] [id number] Rush Farmer MD VC:[id number]    Source (AFB) SPUTUM  Final    Comment: Performed at [facility], [place] [address], [place],  [address]    Michel Bickers, [place] [facility] [place] [facility] [phone] pager   [phone] cell 06/19/2019, 4:19 PM

## 2019-06-20 LAB — GLUCOSE, CAPILLARY: Glucose-Capillary: 78 mg/dL (ref 70–99)

## 2019-06-20 LAB — BASIC METABOLIC PANEL
Anion gap: 10 (ref 5–15)
BUN: 37 mg/dL — ABNORMAL HIGH (ref 8–23)
CO2: 21 mmol/L — ABNORMAL LOW (ref 22–32)
Calcium: 8.3 mg/dL — ABNORMAL LOW (ref 8.9–10.3)
Chloride: 105 mmol/L (ref 98–111)
Creatinine, Ser: 1.62 mg/dL — ABNORMAL HIGH (ref 0.61–1.24)
GFR calc Af Amer: 48 mL/min — ABNORMAL LOW (ref >60)
GFR calc non Af Amer: 42 mL/min — ABNORMAL LOW (ref >60)
Glucose, Bld: 92 mg/dL (ref 70–99)
Potassium: 4.4 mmol/L (ref 3.5–5.1)
Sodium: 136 mmol/L (ref 135–145)

## 2019-06-20 MED ORDER — LACTATED RINGERS IV BOLUS
1000.0000 mL | Freq: Once | INTRAVENOUS | Status: AC
  Administered 2019-06-20: 1000 mL via INTRAVENOUS

## 2019-06-20 NOTE — Progress Notes (Signed)
PROGRESS NOTE    Clarence LaosRobert M Lussier  ZOX:[id number] DOB: Jan 20, 1946 DOA: 05/15/2019 PCP: Kermit Baloeed, Tiffany L, DO   Brief Narrative: Clarence Maldonado is a 73 y.o. male with history of stroke, COPD, CKD stage III, tobacco use, diabetes mellitus type 2, hypertension. Patient presented secondary to weakness and frequent falls, found to have pulmonary TB.  Assessment & Plan:   Principal Problem:   Pulmonary tuberculosis with cavitation Active Problems:   Essential hypertension   CKD (chronic kidney disease) stage 3, GFR 30-59 ml/min (HCC)   Hyperlipidemia   Tobacco use disorder   Controlled type 2 diabetes mellitus with stage 3 chronic kidney disease, without long-term current use of insulin (HCC)   Weight loss   Hyperlipidemia associated with type 2 diabetes mellitus (HCC)   Anemia   Moderate protein-calorie malnutrition (HCC)   Rash   Pulmonary TB Started on RIPE therapy per ID.  Last AFB cuture (9/1) still pending, but AFB smear positive. Discussed with ID, recommend weekly AFB smear to ensure clearance and monitor trajectory.  Patient continues to make good progress in functional ability. -Continue rifampin, isoniazid, pyrazinamide, ethambutol. -Significant disease burden based on the last sputum culture. -AFB sputum smear sent out on 06/16/2019 negative.  06/18/2019 is positive. Per ID we will obtain 2 sputum specimens for AFB smear and culture every 2 weeks.  Acute kidney injury.  On chronic kidney disease stage III Likely from poor p.o. intake. On admission serum creatinine.  1.77.  Improved to 1.3. Currently 1.62. We will provide IV fluid bolus and recheck tomorrow.  Dysphagia Evaluated by speech therapy. MBS significant for evidence of moderate oropharyngeal dysphagia with sensorimotor deficit and hypomotility of the pharynx. Patient deemed a high aspiration risk and placed on a dysphagia 1 diet. -Initially patient was on dysphagia diet.  Currently on regular diet.  This  show significant improvement in patient's deconditioning. -Speech therapy consult appreciated.  Peripheral neuropathy Concern this could be related to RIPE therapy. Started on B6.  Symptoms resolved for now.  Left atrial mobile mass Echocardiogram on 05/16/2019 showed a possible left atrial mobile mass. 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 Outpatient cardiology follow-up.  Non-sustained V-tach Resolved. Hypokalemia and magnesium likely contributed. Biweekly labs.  Hypokalemia/Hypomagnesemia Shown again. We will replace.  Iron deficiency anemia -Continue iron supplementation No bleeding reported.  Diabetes mellitus, type 2, controlled without any complication. -Hemoglobin A1c 6.3 on 05/16/2019. Blood sugars are well controlled. Continue SSI  Weight loss Nutrition/PT consulted  improvement in the hospital 25+ pound weight gain.  Insomnia. Trazodone at nightly as needed. We will continue it on discharge.   DVT prophylaxis: Lovenox Code Status:   Code Status: Full Code Family Communication: None Disposition Plan: Discharge pending safe discharge plan.  patient lives in a trailer alone, does not have safe amenities and will require significant assistance To live on his own.  can put others at risk for exposure.   Consultants:   ID  Procedures:   None  Antimicrobials:  Rifampin, isoniazid, pyrazinamide, ethambutol   Subjective: No nausea no vomiting no fever no chills no chest pain abdominal pain.  Objective: Vitals:   06/19/19 2225 06/20/19 0446 06/20/19 0453 06/20/19 1325  BP: (!) 147/75 (!) 142/72  (!) 151/87  Pulse: (!) 101 94  (!) 106  Resp: 16 20  19   Temp: 97.7 F (36.5 C) 97.9 F (36.6 C)  98.4 F (36.9 C)  TempSrc: Oral Oral    SpO2: 93% 93%  94%  Weight:   66 kg   Height:        Intake/Output Summary (Last 24 hours) at 06/20/2019 1907 Last data filed at 06/20/2019 1826 Gross per 24 hour  Intake 1310 ml  Output 2151 ml   Net -841 ml   Filed Weights   06/18/19 0550 06/19/19 0447 06/20/19 0453  Weight: 63.1 kg 62.7 kg 66 kg   Examination:  General: alert and oriented to time, place, and person. Appear in mild distress, affect appropriate Eyes: PERRL, Conjunctiva normal ENT: Oral Mucosa Clear, moist  Neck: no JVD, no Abnormal Mass Or lumps Cardiovascular: S1 and S2 Present, no Murmur, peripheral pulses symmetrical Respiratory: good respiratory effort, Bilateral Air entry equal and Decreased, no signs of accessory muscle use, bilateral Crackles, no wheezes Abdomen: Bowel Sound present, Soft and no tenderness, no hernia Skin: no rashes  Extremities: no Pedal edema, no calf tenderness Neurologic: without any new focal findings Gait not checked due to patient safety concerns  Data Reviewed: I have personally reviewed following labs and imaging studies  CBC: Recent Labs  Lab 06/17/19 1010 06/19/19 0530  WBC 6.9 6.9  NEUTROABS 4.1  --   HGB 11.2* 11.7*  HCT 38.4* 39.2  MCV 87.1 87.1  PLT 285 235   Basic Metabolic Panel: Recent Labs  Lab 06/17/19 1010 06/19/19 0530 06/20/19 0419  NA 137 137 136  K 4.2 4.5 4.4  CL 103 103 105  CO2 27 23 21*  GLUCOSE 107* 148* 92  BUN 42* 37* 37*  CREATININE 1.49* 1.63* 1.62*  CALCIUM 9.1 9.0 8.3*  MG 2.5* 2.5*  --    GFR: Estimated Creatinine Clearance: 37.2 mL/min (A) (by C-G formula based on SCr of 1.62 mg/dL (H)). Liver Function Tests: Recent Labs  Lab 06/14/19 0522 06/17/19 1010  AST 17 19  ALT 12 13  ALKPHOS 82 89  BILITOT 0.4 0.6  PROT 6.7 7.6  ALBUMIN 2.6* 3.0*   No results for input(s): LIPASE, AMYLASE in the last 168 hours. No results for input(s): AMMONIA in the last 168 hours. Coagulation Profile: No results for input(s): INR, PROTIME in the last 168 hours. Cardiac Enzymes: No results for input(s): CKTOTAL, CKMB, CKMBINDEX, TROPONINI in the last 168 hours. BNP (last 3 results) No results for input(s): PROBNP in the last 8760  hours. HbA1C: No results for input(s): HGBA1C in the last 72 hours. CBG: Recent Labs  Lab 06/16/19 0412 06/17/19 0737 06/20/19 0442  GLUCAP 131* 142* 78   Lipid Profile: No results for input(s): CHOL, HDL, LDLCALC, TRIG, CHOLHDL, LDLDIRECT in the last 72 hours. Thyroid Function Tests: No results for input(s): TSH, T4TOTAL, FREET4, T3FREE, THYROIDAB in the last 72 hours. Anemia Panel: No results for input(s): VITAMINB12, FOLATE, FERRITIN, TIBC, IRON, RETICCTPCT in the last 72 hours. Sepsis Labs: No results for input(s): PROCALCITON, LATICACIDVEN in the last 168 hours.  Recent Results (from the past 240 hour(s))  Acid Fast Smear (AFB)     Status: None   Collection Time: 06/16/19 11:07 AM   Specimen: Sputum  Result Value Ref Range Status   AFB Specimen Processing Concentration  Final   Acid Fast Smear Negative  Final    Comment: (NOTE) Performed At: [facility] [place], [place] [id number] Rush Farmer MD YH:[id number]    Source (AFB) SPUTUM  Final    Comment: Performed at [facility], [place] [address] [place], [place] [address]  Acid Fast Smear (AFB)     Status: Abnormal  Collection Time: 06/18/19  5:00 AM   Specimen: Sputum  Result Value Ref Range Status   AFB Specimen Processing Concentration  Final   Acid Fast Smear Positive (A)  Final    Comment: (NOTE) 3+, 4-36 acid-fast bacilli per field at 400X magnification, fluorescent smear POSITIVE SMEAR REPORTED TO Solmon Ice AT 1018 ON 06/19/19 BY AM. Performed At: [facility] [address] [place], [place] [id number] Jolene Schimke MD JS:[id number]    Source (AFB) SPUTUM  Final    Comment: Performed at [facility], [address] [place], [place] [address]   Radiology Studies: No results found.  Scheduled Meds: . buPROPion  150 mg Oral Daily  . clotrimazole   Topical BID  . enoxaparin (LOVENOX) injection  40 mg Subcutaneous Q24H  .  ethambutol  1,200 mg Oral Daily  . famotidine  20 mg Oral Daily  . feeding supplement (ENSURE ENLIVE)  237 mL Oral Q24H  . ferrous sulfate  325 mg Oral BID WC  . ipratropium  2 puff Inhalation TID  . isoniazid  300 mg Oral Daily   And  . vitamin B-6  50 mg Oral Daily  . loratadine  10 mg Oral Q breakfast  . mouth rinse  15 mL Mouth Rinse BID  . mometasone-formoterol  2 puff Inhalation BID  . multivitamin with minerals  1 tablet Oral Daily  . pravastatin  20 mg Oral q1800  . pyrazinamide  1,500 mg Oral Daily  . rifampin  600 mg Oral Daily  . thiamine  100 mg Oral Daily   Continuous Infusions: . sodium chloride Stopped (05/23/19 0250)     LOS: 36 days     Author:  Lynden Oxford, MD [facility] 06/20/2019  7:07 PM   To reach On-call, see care teams to locate the attending and reach out to them via [email or web address]. If 7PM-7AM, please contact night-coverage If you still have difficulty reaching the attending provider, please page the [facility] (Director on Call) for [facility] on amion for assistance.

## 2019-06-20 NOTE — Progress Notes (Signed)
Occupational Therapy Treatment Patient Details Name: Clarence Maldonado MRN: [id number] DOB: August 26, 1946 Today's Date: 06/20/2019    History of present illness 73 y.o. male with medical history significant of cardiomegaly, stroke, COPD,   CKD stage III, tobacco abuse, DM 2, HTN who presented to the emergency room on 8/10 with progressive weakness over the last 3 months to the point where he was falling over. He has lost approximately 43 pounds in the past 2 months. Pt found to have Pulmonary tuberculosis with cavitation   OT comments  Pt making gradual progress towards OT goals, continues to have limited activity tolerance and requires rest breaks, cues for pursed lip breathing techniques. Pt engaging in seated UB exercise using level 1 theraband for increasing strength/endurance as precursor to completing ADL/functional transfers. Practiced additional room level mobility using RW overall with minguard assist. Pt on RA during session, difficulty to obtain accurate pleth reading consistently but overall sats appear to maintain 87-92%. Recommend continue per POC.    Follow Up Recommendations  SNF;Supervision/Assistance - 24 hour    Equipment Recommendations  3 in 1 bedside commode          Precautions / Restrictions Precautions Precautions: Fall Precaution Comments: monitor O2 Restrictions Weight Bearing Restrictions: No       Mobility Bed Mobility Overal bed mobility: Modified Independent                Transfers Overall transfer level: Needs assistance Equipment used: Rolling walker (2 wheeled) Transfers: Sit to/from Stand Sit to Stand: Supervision         General transfer comment: for safety. VCs hand placement.    Balance Overall balance assessment: Needs assistance Sitting-balance support: Feet supported Sitting balance-Leahy Scale: Good     Standing balance support: No upper extremity supported Standing balance-Leahy Scale: Fair                              ADL either performed or assessed with clinical judgement   ADL Overall ADL's : Needs assistance/impaired                                     Functional mobility during ADLs: Min guard;Rolling walker General ADL Comments: pt engaging in seated theraband exercise and practicing room level mobility this session using RW; continues to require rest breaks due to fatigue and poor endurance     Vision       Perception     Praxis      Cognition Arousal/Alertness: Awake/alert Behavior During Therapy: WFL for tasks assessed/performed Overall Cognitive Status: Within Functional Limits for tasks assessed                                          Exercises Exercises: General Upper Extremity General Exercises - Upper Extremity Shoulder Flexion: AROM;Both;5 reps;Theraband Theraband Level (Shoulder Flexion): Level 1 (Yellow) Shoulder Horizontal ABduction: AROM;Both;Theraband;10 reps Theraband Level (Shoulder Horizontal Abduction): Level 1 (Yellow) Shoulder Horizontal ADduction: AROM;Both;Theraband;10 reps Theraband Level (Shoulder Horizontal Adduction): Level 1 (Yellow) Elbow Flexion: AROM;Both;5 reps;Theraband Theraband Level (Elbow Flexion): Level 1 (Yellow) Elbow Extension: AROM;Both;5 reps;Theraband Theraband Level (Elbow Extension): Level 1 (Yellow)   Shoulder Instructions       General Comments      Pertinent Vitals/ Pain  Pain Assessment: No/denies pain  Home Living                                          Prior Functioning/Environment              Frequency  Min 2X/week        Progress Toward Goals  OT Goals(current goals can now be found in the care plan section)  Progress towards OT goals: Progressing toward goals  Acute Rehab OT Goals Patient Stated Goal: I want to get stronger so I can go home to my dog.  OT Goal Formulation: With patient Time For Goal Achievement:  06/28/19 Potential to Achieve Goals: Good ADL Goals Pt Will Perform Grooming: with min guard assist;standing Pt Will Transfer to Toilet: with min guard assist;bedside commode;ambulating Pt Will Perform Toileting - Clothing Manipulation and hygiene: with min guard assist;sit to/from stand Additional ADL Goal #1: pt will perform adl at set up supervision level  Plan Discharge plan remains appropriate;Frequency needs to be updated    Co-evaluation                 AM-PAC OT "6 Clicks" Daily Activity     Outcome Measure   Help from another person eating meals?: None Help from another person taking care of personal grooming?: A Little Help from another person toileting, which includes using toliet, bedpan, or urinal?: A Little Help from another person bathing (including washing, rinsing, drying)?: A Little Help from another person to put on and taking off regular upper body clothing?: A Little Help from another person to put on and taking off regular lower body clothing?: A Little 6 Click Score: 19    End of Session Equipment Utilized During Treatment: Rolling walker  OT Visit Diagnosis: Unsteadiness on feet (R26.81)   Activity Tolerance Patient tolerated treatment well   Patient Left in bed;with bed alarm set;with call bell/phone within reach   Nurse Communication Mobility status        Time: 1510-1539 OT Time Calculation (min): 29 min  Charges: OT General Charges $OT Visit: 1 Visit OT Treatments $Therapeutic Activity: 8-22 mins $Therapeutic Exercise: 8-22 mins  Lou Cal, OT Supplemental Rehabilitation Services Pager [phone] Office [phone]    Clarence Maldonado 06/20/2019, 4:35 PM

## 2019-06-21 DIAGNOSIS — R918 Other nonspecific abnormal finding of lung field: Secondary | ICD-10-CM | POA: Insufficient documentation

## 2019-06-21 DIAGNOSIS — R5383 Other fatigue: Secondary | ICD-10-CM | POA: Insufficient documentation

## 2019-06-21 LAB — HEPATIC FUNCTION PANEL
ALT: 12 U/L (ref 0–44)
AST: 16 U/L (ref 15–41)
Albumin: 2.6 g/dL — ABNORMAL LOW (ref 3.5–5.0)
Alkaline Phosphatase: 70 U/L (ref 38–126)
Bilirubin, Direct: 0.1 mg/dL (ref 0.0–0.2)
Indirect Bilirubin: 0.4 mg/dL (ref 0.3–0.9)
Total Bilirubin: 0.5 mg/dL (ref 0.3–1.2)
Total Protein: 6.6 g/dL (ref 6.5–8.1)

## 2019-06-21 LAB — BASIC METABOLIC PANEL
Anion gap: 7 (ref 5–15)
BUN: 31 mg/dL — ABNORMAL HIGH (ref 8–23)
CO2: 21 mmol/L — ABNORMAL LOW (ref 22–32)
Calcium: 8.6 mg/dL — ABNORMAL LOW (ref 8.9–10.3)
Chloride: 106 mmol/L (ref 98–111)
Creatinine, Ser: 1.45 mg/dL — ABNORMAL HIGH (ref 0.61–1.24)
GFR calc Af Amer: 55 mL/min — ABNORMAL LOW (ref >60)
GFR calc non Af Amer: 48 mL/min — ABNORMAL LOW (ref >60)
Glucose, Bld: 89 mg/dL (ref 70–99)
Potassium: 3.7 mmol/L (ref 3.5–5.1)
Sodium: 134 mmol/L — ABNORMAL LOW (ref 135–145)

## 2019-06-21 LAB — CBC
HCT: 37.8 % — ABNORMAL LOW (ref 39.0–52.0)
Hemoglobin: 11.1 g/dL — ABNORMAL LOW (ref 13.0–17.0)
MCH: 25.3 pg — ABNORMAL LOW (ref 26.0–34.0)
MCHC: 29.4 g/dL — ABNORMAL LOW (ref 30.0–36.0)
MCV: 86.3 fL (ref 80.0–100.0)
Platelets: 235 10*3/uL (ref 150–400)
RBC: 4.38 MIL/uL (ref 4.22–5.81)
RDW: 25.2 % — ABNORMAL HIGH (ref 11.5–15.5)
WBC: 7.1 10*3/uL (ref 4.0–10.5)
nRBC: 0 % (ref 0.0–0.2)

## 2019-06-21 LAB — GLUCOSE, CAPILLARY: Glucose-Capillary: 70 mg/dL (ref 70–99)

## 2019-06-21 LAB — MAGNESIUM: Magnesium: 2 mg/dL (ref 1.7–2.4)

## 2019-06-21 NOTE — Progress Notes (Signed)
PROGRESS NOTE    Clarence LaosRobert M Bunn  ONG:[id number] DOB: August 16, 1946 DOA: 05/15/2019 PCP: Kermit Baloeed, Tiffany L, DO   Brief Narrative:  Clarence Maldonado is a 73 y.o. male with history of stroke, COPD, CKD stage III, tobacco use, diabetes mellitus type 2, hypertension. Patient presented secondary to weakness and frequent falls, found to have pulmonary TB.   Assessment & Plan:   Principal Problem:   Pulmonary tuberculosis with cavitation Active Problems:   Essential hypertension   CKD (chronic kidney disease) stage 3, GFR 30-59 ml/min (HCC)   Hyperlipidemia   Tobacco use disorder   Controlled type 2 diabetes mellitus with stage 3 chronic kidney disease, without long-term current use of insulin (HCC)   Weight loss   Hyperlipidemia associated with type 2 diabetes mellitus (HCC)   Anemia   Moderate protein-calorie malnutrition (HCC)   Rash   Pulmonary TB Started on RIPE therapy per ID.  Last AFB cuture (9/1) still pending, but AFB smear positive. Discussed with ID, recommend weekly AFB smear to ensure clearance and monitor trajectory.  Patient continues to make good progress in functional ability. -Continue rifampin, isoniazid, pyrazinamide, ethambutol. -Significant disease burden based on the last sputum culture. -AFB sputum smear sent out on 06/16/2019 negative.  06/18/2019 is positive. Per ID we will obtain 2 sputum specimens for AFB smear and culture every 2 weeks. dispo pending ID  Acute kidney injury.  On chronic kidney disease stage III Likely from poor p.o. intake. On admission serum creatinine.  1.77.. Currently 1.45.  Dysphagia Evaluated by speech therapy. MBS significant for evidence of moderate oropharyngeal dysphagia with sensorimotor deficit and hypomotility of the pharynx. Patient deemed a high aspiration risk and placed on a dysphagia 1 diet. -Initially patient was on dysphagia diet.  Currently on regular diet.  This show significant improvement in patient's  deconditioning. -Speech therapy consult appreciated.  Peripheral neuropathy Concern this could be related to RIPE therapy. Started on B6.  Symptoms resolved for now.  Left atrial mobile mass Echocardiogram on 05/16/2019 showed a possible left atrial mobile mass. 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 Outpatient cardiology follow-up.  Non-sustained V-tach Resolved. Hypokalemia and magnesium likely contributed. Biweekly labs.  Hypokalemia/Hypomagnesemia Shown again. We will replace.  Iron deficiency anemia -Continue iron supplementation No bleeding reported.  Diabetes mellitus, type 2, controlled without any complication. -Hemoglobin A1c 6.3 on 05/16/2019. Blood sugars are well controlled. Continue SSI  Weight loss Nutrition/PT consulted  improvement in the hospital 25+ pound weight gain.  Insomnia. Trazodone at nightly as needed. We will continue it on discharge.  DVT prophylaxis: Lovenox SQ  Code Status: FULL    Code Status Orders  (From admission, onward)         Start     Ordered   05/16/19 0038  Full code  Continuous     05/16/19 0038        Code Status History    This patient has a current code status but no historical code status.   Advance Care Planning Activity     Family Communication: NONE TODAY  Disposition Plan:   Patient remained inpatient, continue treatment for pulmonary tuberculosis, continue subspecialty evaluation with infectious disease Consults called: None Admission status: Inpatient   Consultants:   ID  Procedures:  Dg Swallowing Func-speech Pathology  Result Date: 05/25/2019 CLINICAL DATA:  Dysphagia EXAM: ESOPHOGRAM/BARIUM SWALLOW.  Swallowing function TECHNIQUE: A swallowing function exam under fluoroscopy was performed under the direction of speech pathologist utilizing multiple consistencies of barium  to assess the swallowing mechanism. Single contrast examination was performed using  thin barium.  FLUOROSCOPY TIME:  Fluoroscopy Time:  4.8 minutes Radiation Exposure Index (if provided by the fluoroscopic device): 44.6 mGy Number of Acquired Spot Images: 0 full exposures COMPARISON:  None. FINDINGS: Swallow study: Penetration with trace aspiration with thin liquids. Please refer to speech language pathology note for a full detailed report. Esophagram: A single contrast esophagram was performed with patient seated in a chair following a swallow study. Intermittent tertiary contractions were observed under intermittent fluoroscopy. Evaluation of fine mucosal detail was limited secondary to difficulties with patient positioning and single-contrast technique. No persisting structures. No obvious mucosal ulcerations. Small hiatal hernia. A 13 mm barium tablet traversed the esophagus into the stomach without delay. IMPRESSION: 1. Trace aspiration with thin liquids on swallow study. Please refer to speech language pathology note for full detailed report. 2. Limited single-contrast esophagram without persisting stricture or obvious mucosal abnormality. 3. Small hiatal hernia. Electronically Signed   By: Duanne Guess M.D.   On: 05/25/2019 16:48   Dg Esophagus W Single Cm (sol Or Thin Ba)  Result Date: 05/25/2019 CLINICAL DATA:  Dysphagia EXAM: ESOPHOGRAM/BARIUM SWALLOW.  Swallowing function TECHNIQUE: A swallowing function exam under fluoroscopy was performed under the direction of speech pathologist utilizing multiple consistencies of barium to assess the swallowing mechanism. Single contrast examination was performed using  thin barium. FLUOROSCOPY TIME:  Fluoroscopy Time:  4.8 minutes Radiation Exposure Index (if provided by the fluoroscopic device): 44.6 mGy Number of Acquired Spot Images: 0 full exposures COMPARISON:  None. FINDINGS: Swallow study: Penetration with trace aspiration with thin liquids. Please refer to speech language pathology note for a full detailed report. Esophagram: A single contrast  esophagram was performed with patient seated in a chair following a swallow study. Intermittent tertiary contractions were observed under intermittent fluoroscopy. Evaluation of fine mucosal detail was limited secondary to difficulties with patient positioning and single-contrast technique. No persisting structures. No obvious mucosal ulcerations. Small hiatal hernia. A 13 mm barium tablet traversed the esophagus into the stomach without delay. IMPRESSION: 1. Trace aspiration with thin liquids on swallow study. Please refer to speech language pathology note for full detailed report. 2. Limited single-contrast esophagram without persisting stricture or obvious mucosal abnormality. 3. Small hiatal hernia. Electronically Signed   By: Duanne Guess M.D.   On: 05/25/2019 16:48     Antimicrobials:   RIPE therapy    Subjective: Patient reports he is feeling better less coughing more strength  Objective: Vitals:   06/20/19 1926 06/21/19 0500 06/21/19 0522 06/21/19 1325  BP: (!) 143/83  (!) 145/88 (!) 147/92  Pulse: (!) 105  (!) 108 (!) 109  Resp: 20  20 18   Temp: 98.3 F (36.8 C)  97.8 F (36.6 C) 98.1 F (36.7 C)  TempSrc: Oral  Oral Oral  SpO2: 92%  90% 93%  Weight:  66 kg    Height:        Intake/Output Summary (Last 24 hours) at 06/21/2019 1515 Last data filed at 06/21/2019 1400 Gross per 24 hour  Intake 840 ml  Output 1550 ml  Net -710 ml   Filed Weights   06/19/19 0447 06/20/19 0453 06/21/19 0500  Weight: 62.7 kg 66 kg 66 kg    Examination:  General exam: Appears calm and comfortable  Respiratory system: rhonchi bilat, no wheezing Cardiovascular system: S1 & S2 heard, RRR. No JVD, murmurs, rubs, gallops or clicks. No pedal edema. Gastrointestinal system: Abdomen is nondistended,  soft and nontender. No organomegaly or masses felt. Normal bowel sounds heard. Central nervous system: Alert and oriented. No focal neurological deficits. Extremities: wwp, NO CONTRACTURES Skin:  No rashes, lesions or ulcers Psychiatry: Judgement and insight intactl. Mood & affect appropriate.     Data Reviewed: I have personally reviewed following labs and imaging studies  CBC: Recent Labs  Lab 06/17/19 1010 06/19/19 0530 06/21/19 0450  WBC 6.9 6.9 7.1  NEUTROABS 4.1  --   --   HGB 11.2* 11.7* 11.1*  HCT 38.4* 39.2 37.8*  MCV 87.1 87.1 86.3  PLT 285 281 235   Basic Metabolic Panel: Recent Labs  Lab 06/17/19 1010 06/19/19 0530 06/20/19 0419 06/21/19 0450  NA 137 137 136 134*  K 4.2 4.5 4.4 3.7  CL 103 103 105 106  CO2 27 23 21* 21*  GLUCOSE 107* 148* 92 89  BUN 42* 37* 37* 31*  CREATININE 1.49* 1.63* 1.62* 1.45*  CALCIUM 9.1 9.0 8.3* 8.6*  MG 2.5* 2.5*  --  2.0   GFR: Estimated Creatinine Clearance: 41.6 mL/min (A) (by C-G formula based on SCr of 1.45 mg/dL (H)). Liver Function Tests: Recent Labs  Lab 06/17/19 1010 06/21/19 0450  AST 19 16  ALT 13 12  ALKPHOS 89 70  BILITOT 0.6 0.5  PROT 7.6 6.6  ALBUMIN 3.0* 2.6*   No results for input(s): LIPASE, AMYLASE in the last 168 hours. No results for input(s): AMMONIA in the last 168 hours. Coagulation Profile: No results for input(s): INR, PROTIME in the last 168 hours. Cardiac Enzymes: No results for input(s): CKTOTAL, CKMB, CKMBINDEX, TROPONINI in the last 168 hours. BNP (last 3 results) No results for input(s): PROBNP in the last 8760 hours. HbA1C: No results for input(s): HGBA1C in the last 72 hours. CBG: Recent Labs  Lab 06/16/19 0412 06/17/19 0737 06/20/19 0442 06/21/19 0515  GLUCAP 131* 142* 78 70   Lipid Profile: No results for input(s): CHOL, HDL, LDLCALC, TRIG, CHOLHDL, LDLDIRECT in the last 72 hours. Thyroid Function Tests: No results for input(s): TSH, T4TOTAL, FREET4, T3FREE, THYROIDAB in the last 72 hours. Anemia Panel: No results for input(s): VITAMINB12, FOLATE, FERRITIN, TIBC, IRON, RETICCTPCT in the last 72 hours. Sepsis Labs: No results for input(s): PROCALCITON,  LATICACIDVEN in the last 168 hours.  Recent Results (from the past 240 hour(s))  Acid Fast Smear (AFB)     Status: None   Collection Time: 06/16/19 11:07 AM   Specimen: Sputum  Result Value Ref Range Status   AFB Specimen Processing Concentration  Final   Acid Fast Smear Negative  Final    Comment: (NOTE) Performed At: [facility] [place] [address] [place], [place] [id number] Jolene SchimkeNagendra Sanjai MD UX:[phone]    Source (AFB) SPUTUM  Final    Comment: Performed at [facility], [address] [place], [place] [address]  Acid Fast Smear (AFB)     Status: Abnormal   Collection Time: 06/18/19  5:00 AM   Specimen: Sputum  Result Value Ref Range Status   AFB Specimen Processing Concentration  Final   Acid Fast Smear Positive (A)  Final    Comment: (NOTE) 3+, 4-36 acid-fast bacilli per field at 400X magnification, fluorescent smear POSITIVE SMEAR REPORTED TO Solmon IceVINCENT WILKENS AT 1018 ON 06/19/19 BY AM. Performed At: [facility] [place] [address] [place], [place] [id number] Jolene SchimkeNagendra Sanjai MD DG:[id number]    Source (AFB) SPUTUM  Final    Comment: Performed at [facility], [address]  [place], [place] [address]         Radiology Studies: No results found.      Scheduled Meds: . buPROPion  150 mg Oral Daily  . clotrimazole   Topical BID  . enoxaparin (LOVENOX) injection  40 mg Subcutaneous Q24H  . ethambutol  1,200 mg Oral Daily  . famotidine  20 mg Oral Daily  . feeding supplement (ENSURE ENLIVE)  237 mL Oral Q24H  . ferrous sulfate  325 mg Oral BID WC  . ipratropium  2 puff Inhalation TID  . isoniazid  300 mg Oral Daily   And  . vitamin B-6  50 mg Oral Daily  . loratadine  10 mg Oral Q breakfast  . mouth rinse  15 mL Mouth Rinse BID  . mometasone-formoterol  2 puff Inhalation BID  . multivitamin with minerals  1 tablet Oral Daily  . pravastatin  20 mg Oral q1800  . pyrazinamide  1,500 mg Oral Daily  . rifampin   600 mg Oral Daily  . thiamine  100 mg Oral Daily   Continuous Infusions: . sodium chloride Stopped (05/23/19 0250)     LOS: 37 days    Time spent: 48 min     Nicolette Bang, MD [facility]  If 7PM-7AM, please contact night-coverage  06/21/2019, 3:15 PM

## 2019-06-21 NOTE — Progress Notes (Signed)
PHYSICAL THERAPY  Nursing staff reports pt is amb around room at [facility] level and reported they amb him earlier.  Awaiting placement.  Will cont to follow.  Rica Koyanagi  PTA Acute  Rehabilitation Services Pager      [phone] Office      [phone]

## 2019-06-21 NOTE — Care Management Important Message (Signed)
Important Message  Patient Details IM Letter given to [name] RN to present to the Patient Name: Clarence Maldonado MRN: [id number] Date of Birth: 1946/02/06   Medicare Important Message Given:  Yes     Kerin Salen 06/21/2019, 10:55 AM

## 2019-06-21 NOTE — TOC Progression Note (Signed)
Transition of Care [facility]) - Progression Note    Patient Details  Name: RAYANSH HERBST MRN: [id number] Date of Birth: [birth year]  Transition of [facility]) CM/SW Contact  Purcell Mouton, RN Phone Number: 06/21/2019, 12:14 PM  Clinical Narrative:    Pt TB Smear Positive on 9/13. ID will assist in letting us know when pt is ready for discharge. No SNF will take pt at all, related to this being a new TB case, and the safety of other pt's. This information was shared from four other facilities. Pt was faxed to facilities in 50 miles, everyone declined this pt. Pt has a safe place to discharge to, his camper. [place] will need to be notified at least 48 hours before discharge. Please consider pt discharging home, with [facility].    Expected Discharge Plan: [place] Barriers to Discharge: Continued Medical Work up  Expected Discharge Plan and Services Expected Discharge Plan: [place]   Discharge Planning Services: CM Consult   Living arrangements for the past 2 months: Mobile Home                                       Social Determinants of Health (SDOH) Interventions    Readmission Risk Interventions Readmission Risk Prevention Plan 05/23/2019  Transportation Screening Complete  PCP or Specialist Appt within 3-5 Days Not Complete  Not Complete comments Not ready for dc  HRI or [place] Complete  Social Work Consult for [place] Planning/Counseling Not Complete  SW consult not completed comments nA  Palliative Care Screening Not Applicable  Medication Review Press photographer) Complete  Some recent data might be hidden

## 2019-06-22 LAB — BASIC METABOLIC PANEL
Anion gap: 10 (ref 5–15)
BUN: 30 mg/dL — ABNORMAL HIGH (ref 8–23)
CO2: 21 mmol/L — ABNORMAL LOW (ref 22–32)
Calcium: 8.6 mg/dL — ABNORMAL LOW (ref 8.9–10.3)
Chloride: 105 mmol/L (ref 98–111)
Creatinine, Ser: 1.43 mg/dL — ABNORMAL HIGH (ref 0.61–1.24)
GFR calc Af Amer: 56 mL/min — ABNORMAL LOW (ref >60)
GFR calc non Af Amer: 49 mL/min — ABNORMAL LOW (ref >60)
Glucose, Bld: 91 mg/dL (ref 70–99)
Potassium: 3.7 mmol/L (ref 3.5–5.1)
Sodium: 136 mmol/L (ref 135–145)

## 2019-06-22 LAB — CBC
HCT: 37.2 % — ABNORMAL LOW (ref 39.0–52.0)
Hemoglobin: 11.2 g/dL — ABNORMAL LOW (ref 13.0–17.0)
MCH: 25.8 pg — ABNORMAL LOW (ref 26.0–34.0)
MCHC: 30.1 g/dL (ref 30.0–36.0)
MCV: 85.7 fL (ref 80.0–100.0)
Platelets: 256 10*3/uL (ref 150–400)
RBC: 4.34 MIL/uL (ref 4.22–5.81)
WBC: 5.9 10*3/uL (ref 4.0–10.5)
nRBC: 0 % (ref 0.0–0.2)

## 2019-06-22 LAB — GLUCOSE, CAPILLARY: Glucose-Capillary: 131 mg/dL — ABNORMAL HIGH (ref 70–99)

## 2019-06-22 NOTE — Progress Notes (Signed)
PROGRESS NOTE    Clarence Maldonado  GYI:[id number] DOB: 08-28-1946 DOA: 05/15/2019 PCP: Gayland Curry, DO   Brief Narrative:  Clarence Estimable Leonardis a 73 y.o.male with history of stroke, COPD, CKD stage III, tobacco use, diabetes mellitus type 2, hypertension. Patient presented secondary to weakness and frequent falls, found to have pulmonary TB   Assessment & Plan:   Principal Problem:   Pulmonary tuberculosis with cavitation Active Problems:   Essential hypertension   CKD (chronic kidney disease) stage 3, GFR 30-59 ml/min (HCC)   Hyperlipidemia   Tobacco use disorder   Controlled type 2 diabetes mellitus with stage 3 chronic kidney disease, without long-term current use of insulin (HCC)   Weight loss   Hyperlipidemia associated with type 2 diabetes mellitus (HCC)   Anemia   Moderate protein-calorie malnutrition (HCC)   Rash   Fatigue   Lung infiltrate   Pulmonary TB Started on RIPE therapy per ID.  Last AFB cuture (9/1) still pending, but AFB smear positive. Discussed with ID, recommend weekly AFB smear to ensure clearance and monitor trajectory.  Patient continues to make good progress in functional ability. -Continue rifampin, isoniazid, pyrazinamide, ethambutol. -Significant disease burden based on the last sputum culture. -AFB sputum smear sent out on 9/11/2020negative. 06/18/2019 is positive. Per ID we will obtain 2 sputum specimens for AFB smear and culture every 2 weeks. dispo pending ID  Acute kidney injury. On chronic kidney disease stage III Likely from poor p.o. intake. On admission serum creatinine. 1.77.. Currently 1.45.  Dysphagia Evaluated by speech therapy. MBS significant for evidence of moderate oropharyngeal dysphagia with sensorimotor deficit and hypomotility of the pharynx. Patient deemed a high aspiration risk and placed on a dysphagia 1 diet. -Initially patient was on dysphagia diet. Currently on regular diet. This show significant  improvement in patient's deconditioning. -Speech therapy consult appreciated.  Peripheral neuropathy Concern this could be related to RIPE therapy. Started on B6.  Symptoms resolved for now.  Left atrial mobile mass Echocardiogram on 05/16/2019 showed a possible left atrial mobile mass. 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 Outpatient cardiology follow-up.  Non-sustained V-tach Resolved. Hypokalemia and magnesium likely contributed. Biweekly labs.  Hypokalemia/Hypomagnesemia Shown again. We will replace.  Iron deficiency anemia -Continue iron supplementation No bleeding reported.  Diabetes mellitus, type 2, controlled without any complication. -Hemoglobin A1c 6.3 on 05/16/2019. Blood sugars are well controlled. Continue SSI  Weight loss Nutrition/PT consulted improvement in the hospital 25+ pound weight gain.  Insomnia. Trazodone at nightly as needed. We will continue it on discharge.   DVT prophylaxis: Lovenox SQ  Code Status: full    Code Status Orders  (From admission, onward)         Start     Ordered   05/16/19 0038  Full code  Continuous     05/16/19 0038        Code Status History    This patient has a current code status but no historical code status.   Advance Care Planning Activity     Family Communication: none today  Disposition Plan:   Discussed the case with infectious disease.  Patient will need to remain in hospital for an extended period of time for TB clearance before reintroduction into the community.  There is no other safe discharge plan per ID Consults called: None Admission status: Inpatient   Consultants:   ID  Procedures:  Dg Swallowing Func-speech Pathology  Result Date: 05/25/2019 CLINICAL DATA:  Dysphagia EXAM: ESOPHOGRAM/BARIUM SWALLOW.  Swallowing function TECHNIQUE: A swallowing function exam under fluoroscopy was performed under the direction of speech pathologist utilizing multiple  consistencies of barium to assess the swallowing mechanism. Single contrast examination was performed using  thin barium. FLUOROSCOPY TIME:  Fluoroscopy Time:  4.8 minutes Radiation Exposure Index (if provided by the fluoroscopic device): 44.6 mGy Number of Acquired Spot Images: 0 full exposures COMPARISON:  None. FINDINGS: Swallow study: Penetration with trace aspiration with thin liquids. Please refer to speech language pathology note for a full detailed report. Esophagram: A single contrast esophagram was performed with patient seated in a chair following a swallow study. Intermittent tertiary contractions were observed under intermittent fluoroscopy. Evaluation of fine mucosal detail was limited secondary to difficulties with patient positioning and single-contrast technique. No persisting structures. No obvious mucosal ulcerations. Small hiatal hernia. A 13 mm barium tablet traversed the esophagus into the stomach without delay. IMPRESSION: 1. Trace aspiration with thin liquids on swallow study. Please refer to speech language pathology note for full detailed report. 2. Limited single-contrast esophagram without persisting stricture or obvious mucosal abnormality. 3. Small hiatal hernia. Electronically Signed   By: Duanne GuessNicholas  Plundo M.D.   On: 05/25/2019 16:48   Dg Esophagus W Single Cm (sol Or Thin Ba)  Result Date: 05/25/2019 CLINICAL DATA:  Dysphagia EXAM: ESOPHOGRAM/BARIUM SWALLOW.  Swallowing function TECHNIQUE: A swallowing function exam under fluoroscopy was performed under the direction of speech pathologist utilizing multiple consistencies of barium to assess the swallowing mechanism. Single contrast examination was performed using  thin barium. FLUOROSCOPY TIME:  Fluoroscopy Time:  4.8 minutes Radiation Exposure Index (if provided by the fluoroscopic device): 44.6 mGy Number of Acquired Spot Images: 0 full exposures COMPARISON:  None. FINDINGS: Swallow study: Penetration with trace aspiration with  thin liquids. Please refer to speech language pathology note for a full detailed report. Esophagram: A single contrast esophagram was performed with patient seated in a chair following a swallow study. Intermittent tertiary contractions were observed under intermittent fluoroscopy. Evaluation of fine mucosal detail was limited secondary to difficulties with patient positioning and single-contrast technique. No persisting structures. No obvious mucosal ulcerations. Small hiatal hernia. A 13 mm barium tablet traversed the esophagus into the stomach without delay. IMPRESSION: 1. Trace aspiration with thin liquids on swallow study. Please refer to speech language pathology note for full detailed report. 2. Limited single-contrast esophagram without persisting stricture or obvious mucosal abnormality. 3. Small hiatal hernia. Electronically Signed   By: Duanne GuessNicholas  Plundo M.D.   On: 05/25/2019 16:48     Antimicrobials:   Ripe TX   Subjective: Acute events overnight  Objective: Vitals:   06/21/19 1325 06/21/19 2023 06/22/19 0557 06/22/19 1330  BP: (!) 147/92 (!) 153/85 (!) 157/84 (!) 145/79  Pulse: (!) 109 98 95 89  Resp: 18 18 16 16   Temp: 98.1 F (36.7 C) 98.8 F (37.1 C) 98 F (36.7 C) 98.5 F (36.9 C)  TempSrc: Oral Oral Oral Oral  SpO2: 93% 93% 92% 94%  Weight:   63.6 kg   Height:        Intake/Output Summary (Last 24 hours) at 06/22/2019 1502 Last data filed at 06/22/2019 1330 Gross per 24 hour  Intake 690 ml  Output 1550 ml  Net -860 ml   Filed Weights   06/20/19 0453 06/21/19 0500 06/22/19 0557  Weight: 66 kg 66 kg 63.6 kg    Examination:  General exam: Appears calm and comfortable  Respiratory system: Mild rhonchi no accessory muscle use cardiovascular system: S1 &  S2 heard, RRR. No JVD, murmurs, rubs, gallops or clicks. No pedal edema. Gastrointestinal system: Abdomen is nondistended, soft and nontender. No organomegaly or masses felt. Normal bowel sounds heard. Central  nervous system: Alert and oriented. No focal neurological deficits. Extremities: Warm well perfused no edema. Skin: No rashes, lesions or ulcers Psychiatry: Judgement and insight appear normal. Mood & affect appropriate.     Data Reviewed: I have personally reviewed following labs and imaging studies  CBC: Recent Labs  Lab 06/17/19 1010 06/19/19 0530 06/21/19 0450 06/22/19 0359  WBC 6.9 6.9 7.1 5.9  NEUTROABS 4.1  --   --   --   HGB 11.2* 11.7* 11.1* 11.2*  HCT 38.4* 39.2 37.8* 37.2*  MCV 87.1 87.1 86.3 85.7  PLT 285 281 235 256   Basic Metabolic Panel: Recent Labs  Lab 06/17/19 1010 06/19/19 0530 06/20/19 0419 06/21/19 0450 06/22/19 0359  NA 137 137 136 134* 136  K 4.2 4.5 4.4 3.7 3.7  CL 103 103 105 106 105  CO2 27 23 21* 21* 21*  GLUCOSE 107* 148* 92 89 91  BUN 42* 37* 37* 31* 30*  CREATININE 1.49* 1.63* 1.62* 1.45* 1.43*  CALCIUM 9.1 9.0 8.3* 8.6* 8.6*  MG 2.5* 2.5*  --  2.0  --    GFR: Estimated Creatinine Clearance: 42 mL/min (A) (by C-G formula based on SCr of 1.43 mg/dL (H)). Liver Function Tests: Recent Labs  Lab 06/17/19 1010 06/21/19 0450  AST 19 16  ALT 13 12  ALKPHOS 89 70  BILITOT 0.6 0.5  PROT 7.6 6.6  ALBUMIN 3.0* 2.6*   No results for input(s): LIPASE, AMYLASE in the last 168 hours. No results for input(s): AMMONIA in the last 168 hours. Coagulation Profile: No results for input(s): INR, PROTIME in the last 168 hours. Cardiac Enzymes: No results for input(s): CKTOTAL, CKMB, CKMBINDEX, TROPONINI in the last 168 hours. BNP (last 3 results) No results for input(s): PROBNP in the last 8760 hours. HbA1C: No results for input(s): HGBA1C in the last 72 hours. CBG: Recent Labs  Lab 06/16/19 0412 06/17/19 0737 06/20/19 0442 06/21/19 0515 06/22/19 0850  GLUCAP 131* 142* 78 70 131*   Lipid Profile: No results for input(s): CHOL, HDL, LDLCALC, TRIG, CHOLHDL, LDLDIRECT in the last 72 hours. Thyroid Function Tests: No results for  input(s): TSH, T4TOTAL, FREET4, T3FREE, THYROIDAB in the last 72 hours. Anemia Panel: No results for input(s): VITAMINB12, FOLATE, FERRITIN, TIBC, IRON, RETICCTPCT in the last 72 hours. Sepsis Labs: No results for input(s): PROCALCITON, LATICACIDVEN in the last 168 hours.  Recent Results (from the past 240 hour(s))  Acid Fast Smear (AFB)     Status: None   Collection Time: 06/16/19 11:07 AM   Specimen: Sputum  Result Value Ref Range Status   AFB Specimen Processing Concentration  Final   Acid Fast Smear Negative  Final    Comment: (NOTE) Performed At: [facility] [address] [place], [place] [id number] Jolene Schimke MD [id number]    Source (AFB) SPUTUM  Final    Comment: Performed at [facility], [address] [place], [place] [address]  Acid Fast Smear (AFB)     Status: Abnormal   Collection Time: 06/18/19  5:00 AM   Specimen: Sputum  Result Value Ref Range Status   AFB Specimen Processing Concentration  Final   Acid Fast Smear Positive (A)  Final    Comment: (NOTE) 3+, 4-36 acid-fast bacilli per field at 400X magnification, fluorescent smear POSITIVE SMEAR  REPORTED TO Solmon IceVINCENT WILKENS AT 1018 ON 06/19/19 BY AM. Performed At: [facility] [address] [place], [place] [id number] Jolene SchimkeNagendra Sanjai MD WU:[id number]:[phone]    Source (AFB) SPUTUM  Final    Comment: [facility]formed at [facility], [address] [place], [place] [id number]         Radiology Studies: No results found.      Scheduled Meds: . buPROPion  150 mg Oral Daily  . clotrimazole   Topical BID  . enoxaparin (LOVENOX) injection  40 mg Subcutaneous Q24H  . ethambutol  1,200 mg Oral Daily  . famotidine  20 mg Oral Daily  . feeding supplement (ENSURE ENLIVE)  237 mL Oral Q24H  . ferrous sulfate  325 mg Oral BID WC  . ipratropium  2 puff Inhalation TID  . isoniazid  300 mg Oral Daily   And  . vitamin B-6  50 mg Oral Daily  . loratadine  10 mg  Oral Q breakfast  . mouth rinse  15 mL Mouth Rinse BID  . mometasone-formoterol  2 puff Inhalation BID  . multivitamin with minerals  1 tablet Oral Daily  . pravastatin  20 mg Oral q1800  . pyrazinamide  1,500 mg Oral Daily  . rifampin  600 mg Oral Daily  . thiamine  100 mg Oral Daily   Continuous Infusions: . sodium chloride Stopped (05/23/19 0250)     LOS: 38 days    Time spent: 25 MIN    Burke Keelshristopher , MD [facility]  If 7PM-7AM, please contact night-coverage  06/22/2019, 3:02 PM

## 2019-06-23 DIAGNOSIS — E440 Moderate protein-calorie malnutrition: Secondary | ICD-10-CM

## 2019-06-23 LAB — BASIC METABOLIC PANEL
Anion gap: 9 (ref 5–15)
BUN: 34 mg/dL — ABNORMAL HIGH (ref 8–23)
CO2: 23 mmol/L (ref 22–32)
Calcium: 8.7 mg/dL — ABNORMAL LOW (ref 8.9–10.3)
Chloride: 103 mmol/L (ref 98–111)
Creatinine, Ser: 1.5 mg/dL — ABNORMAL HIGH (ref 0.61–1.24)
GFR calc Af Amer: 53 mL/min — ABNORMAL LOW (ref >60)
GFR calc non Af Amer: 46 mL/min — ABNORMAL LOW (ref >60)
Glucose, Bld: 92 mg/dL (ref 70–99)
Potassium: 4 mmol/L (ref 3.5–5.1)
Sodium: 135 mmol/L (ref 135–145)

## 2019-06-23 LAB — CBC
HCT: 37.9 % — ABNORMAL LOW (ref 39.0–52.0)
Hemoglobin: 11.4 g/dL — ABNORMAL LOW (ref 13.0–17.0)
MCH: 25.9 pg — ABNORMAL LOW (ref 26.0–34.0)
MCHC: 30.1 g/dL (ref 30.0–36.0)
MCV: 86.1 fL (ref 80.0–100.0)
Platelets: 242 10*3/uL (ref 150–400)
RBC: 4.4 MIL/uL (ref 4.22–5.81)
WBC: 6.1 10*3/uL (ref 4.0–10.5)
nRBC: 0 % (ref 0.0–0.2)

## 2019-06-23 LAB — GLUCOSE, CAPILLARY: Glucose-Capillary: 98 mg/dL (ref 70–99)

## 2019-06-23 NOTE — Progress Notes (Signed)
PROGRESS NOTE    Clarence Maldonado  OZH:[id number] DOB: 03-Nov-1945 DOA: 05/15/2019 PCP: Clarence Baloeed, Tiffany L, DO   Brief Narrative:  Clarence Maldonado a 73 y.o.male with history of stroke, COPD, CKD stage III, tobacco use, diabetes mellitus type 2, hypertension. Patient presented secondary to weakness and frequent falls, found to have pulmonary TB    Assessment & Plan:   Principal Problem:   Pulmonary tuberculosis with cavitation Active Problems:   Essential hypertension   CKD (chronic kidney disease) stage 3, GFR 30-59 ml/min (HCC)   Hyperlipidemia   Tobacco use disorder   Controlled type 2 diabetes mellitus with stage 3 chronic kidney disease, without long-term current use of insulin (HCC)   Weight loss   Hyperlipidemia associated with type 2 diabetes mellitus (HCC)   Anemia   Moderate protein-calorie malnutrition (HCC)   Rash   Fatigue   Lung infiltrate   Pulmonary TB Started on RIPE therapy per ID.  Last AFB cuture (9/1) still pending, but AFB smear positive. Discussed with ID, recommend weekly AFB smear to ensure clearance and monitor trajectory. Most recent positive AFB smear was 9/13,  3+  Patient continues to make good progress in functional ability. -Continue rifampin, isoniazid, pyrazinamide, ethambutol. -Significant disease burden based on the last sputum culture. -AFB sputum smear sent out on 9/11/2020negative. 06/18/2019 is positive. Per ID we will obtain 2 sputum specimens for AFB smear and culture every 2 weeks. dispo pending ID  Acute kidney injury. On chronic kidney disease stage III Likely from poor p.o. intake. On admission serum creatinine. 1.77.. Currently 1.45.  Dysphagia Evaluated by speech therapy. MBS significant for evidence of moderate oropharyngeal dysphagia with sensorimotor deficit and hypomotility of the pharynx. Patient deemed a high aspiration risk and placed on a dysphagia 1 diet. -Initially patient was on dysphagia diet.  Currently on regular diet. This show significant improvement in patient's deconditioning. -Speech therapy consult appreciated.  Peripheral neuropathy Concern this could be related to RIPE therapy. Started on B6.  Symptoms resolved for now.  Left atrial mobile mass Echocardiogram on 05/16/2019 showed a possible left atrial mobile mass. 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 Outpatient cardiology follow-up.  Non-sustained V-tach Resolved. Hypokalemia and magnesium likely contributed. Biweekly labs.  Hypokalemia/Hypomagnesemia Shown again. We will replace.  Iron deficiency anemia -Continue iron supplementation No bleeding reported.  Diabetes mellitus, type 2, controlled without any complication. -Hemoglobin A1c 6.3 on 05/16/2019. Blood sugars are well controlled. Continue SSI  Weight loss Nutrition/PT consulted improvement in the hospital 25+ pound weight gain.  Insomnia. Trazodone at nightly as needed. We will continue it on discharge.  DVT prophylaxis: Lovenox SQ  Code Status: Full    Code Status Orders  (From admission, onward)         Start     Ordered   05/16/19 0038  Full code  Continuous     05/16/19 0038        Code Status History    This patient has a current code status but no historical code status.   Advance Care Planning Activity     Family Communication: none today, discussed in detail with patient Disposition Plan:   Patient remained inpatient for continued treatment of pulmonary tuberculosis, Consults called: None Admission status: Inpatient   Consultants:   Infectious disease  Procedures:  Dg Swallowing Func-speech Pathology  Result Date: 05/25/2019 CLINICAL DATA:  Dysphagia EXAM: ESOPHOGRAM/BARIUM SWALLOW.  Swallowing function TECHNIQUE: A swallowing function exam under fluoroscopy was performed under the direction  of speech pathologist utilizing multiple consistencies of barium to assess the swallowing  mechanism. Single contrast examination was performed using  thin barium. FLUOROSCOPY TIME:  Fluoroscopy Time:  4.8 minutes Radiation Exposure Index (if provided by the fluoroscopic device): 44.6 mGy Number of Acquired Spot Images: 0 full exposures COMPARISON:  None. FINDINGS: Swallow study: Penetration with trace aspiration with thin liquids. Please refer to speech language pathology note for a full detailed report. Esophagram: A single contrast esophagram was performed with patient seated in a chair following a swallow study. Intermittent tertiary contractions were observed under intermittent fluoroscopy. Evaluation of fine mucosal detail was limited secondary to difficulties with patient positioning and single-contrast technique. No persisting structures. No obvious mucosal ulcerations. Small hiatal hernia. A 13 mm barium tablet traversed the esophagus into the stomach without delay. IMPRESSION: 1. Trace aspiration with thin liquids on swallow study. Please refer to speech language pathology note for full detailed report. 2. Limited single-contrast esophagram without persisting stricture or obvious mucosal abnormality. 3. Small hiatal hernia. Electronically Signed   By: Duanne Guess M.D.   On: 05/25/2019 16:48   Dg Esophagus W Single Cm (sol Or Thin Ba)  Result Date: 05/25/2019 CLINICAL DATA:  Dysphagia EXAM: ESOPHOGRAM/BARIUM SWALLOW.  Swallowing function TECHNIQUE: A swallowing function exam under fluoroscopy was performed under the direction of speech pathologist utilizing multiple consistencies of barium to assess the swallowing mechanism. Single contrast examination was performed using  thin barium. FLUOROSCOPY TIME:  Fluoroscopy Time:  4.8 minutes Radiation Exposure Index (if provided by the fluoroscopic device): 44.6 mGy Number of Acquired Spot Images: 0 full exposures COMPARISON:  None. FINDINGS: Swallow study: Penetration with trace aspiration with thin liquids. Please refer to speech language  pathology note for a full detailed report. Esophagram: A single contrast esophagram was performed with patient seated in a chair following a swallow study. Intermittent tertiary contractions were observed under intermittent fluoroscopy. Evaluation of fine mucosal detail was limited secondary to difficulties with patient positioning and single-contrast technique. No persisting structures. No obvious mucosal ulcerations. Small hiatal hernia. A 13 mm barium tablet traversed the esophagus into the stomach without delay. IMPRESSION: 1. Trace aspiration with thin liquids on swallow study. Please refer to speech language pathology note for full detailed report. 2. Limited single-contrast esophagram without persisting stricture or obvious mucosal abnormality. 3. Small hiatal hernia. Electronically Signed   By: Duanne Guess M.D.   On: 05/25/2019 16:48     Antimicrobials:   RIPE therapy    Subjective: Patient reports feeling fatigued and short of breath this morning. PT noted shortness of breath/backslash with exertion although patient in recovery.  Objective: Vitals:   06/22/19 1330 06/22/19 2133 06/23/19 0526 06/23/19 1327  BP: (!) 145/79 132/86 140/85 (!) 146/95  Pulse: 89 96 94 100  Resp: 16 16 16 18   Temp: 98.5 F (36.9 C) 98.4 F (36.9 C) 98.4 F (36.9 C) 98.2 F (36.8 C)  TempSrc: Oral Oral Oral Oral  SpO2: 94% 94% 93% 98%  Weight:   62.5 kg   Height:        Intake/Output Summary (Last 24 hours) at 06/23/2019 1408 Last data filed at 06/23/2019 1333 Gross per 24 hour  Intake 540 ml  Output 1750 ml  Net -1210 ml   Filed Weights   06/21/19 0500 06/22/19 0557 06/23/19 0526  Weight: 66 kg 63.6 kg 62.5 kg    Examination:  General exam: Appears calm and comfortable  Respiratory system: Clear to auscultation. Respiratory effort normal. Cardiovascular system:  S1 & S2 heard, RRR. No JVD, murmurs, rubs, gallops or clicks. No pedal edema. Gastrointestinal system: Abdomen is  nondistended, soft and nontender. No organomegaly or masses felt. Normal bowel sounds heard. Central nervous system: Alert and oriented. No focal neurological deficits. Extremities: wwp, no edema. Skin: No rashes, lesions or ulcers Psychiatry: Judgement and insight appear normal. Mood & affect appropriate.     Data Reviewed: I have personally reviewed following labs and imaging studies  CBC: Recent Labs  Lab 06/17/19 1010 06/19/19 0530 06/21/19 0450 06/22/19 0359 06/23/19 0345  WBC 6.9 6.9 7.1 5.9 6.1  NEUTROABS 4.1  --   --   --   --   HGB 11.2* 11.7* 11.1* 11.2* 11.4*  HCT 38.4* 39.2 37.8* 37.2* 37.9*  MCV 87.1 87.1 86.3 85.7 86.1  PLT 285 281 235 256 242   Basic Metabolic Panel: Recent Labs  Lab 06/17/19 1010 06/19/19 0530 06/20/19 0419 06/21/19 0450 06/22/19 0359 06/23/19 0345  NA 137 137 136 134* 136 135  K 4.2 4.5 4.4 3.7 3.7 4.0  CL 103 103 105 106 105 103  CO2 27 23 21* 21* 21* 23  GLUCOSE 107* 148* 92 89 91 92  BUN 42* 37* 37* 31* 30* 34*  CREATININE 1.49* 1.63* 1.62* 1.45* 1.43* 1.50*  CALCIUM 9.1 9.0 8.3* 8.6* 8.6* 8.7*  MG 2.5* 2.5*  --  2.0  --   --    GFR: Estimated Creatinine Clearance: 39.4 mL/min (A) (by C-G formula based on SCr of 1.5 mg/dL (H)). Liver Function Tests: Recent Labs  Lab 06/17/19 1010 06/21/19 0450  AST 19 16  ALT 13 12  ALKPHOS 89 70  BILITOT 0.6 0.5  PROT 7.6 6.6  ALBUMIN 3.0* 2.6*   No results for input(s): LIPASE, AMYLASE in the last 168 hours. No results for input(s): AMMONIA in the last 168 hours. Coagulation Profile: No results for input(s): INR, PROTIME in the last 168 hours. Cardiac Enzymes: No results for input(s): CKTOTAL, CKMB, CKMBINDEX, TROPONINI in the last 168 hours. BNP (last 3 results) No results for input(s): PROBNP in the last 8760 hours. HbA1C: No results for input(s): HGBA1C in the last 72 hours. CBG: Recent Labs  Lab 06/17/19 0737 06/20/19 0442 06/21/19 0515 06/22/19 0850 06/23/19 0854   GLUCAP 142* 78 70 131* 98   Lipid Profile: No results for input(s): CHOL, HDL, LDLCALC, TRIG, CHOLHDL, LDLDIRECT in the last 72 hours. Thyroid Function Tests: No results for input(s): TSH, T4TOTAL, FREET4, T3FREE, THYROIDAB in the last 72 hours. Anemia Panel: No results for input(s): VITAMINB12, FOLATE, FERRITIN, TIBC, IRON, RETICCTPCT in the last 72 hours. Sepsis Labs: No results for input(s): PROCALCITON, LATICACIDVEN in the last 168 hours.  Recent Results (from the past 240 hour(s))  Acid Fast Smear (AFB)     Status: None   Collection Time: 06/16/19 11:07 AM   Specimen: Sputum  Result Value Ref Range Status   AFB Specimen Processing Concentration  Final   Acid Fast Smear Negative  Final    Comment: (NOTE) Performed At: [facility] [address] [place], [place] [id number] Jolene SchimkeNagendra Sanjai MD GM:[id number]    Source (AFB) SPUTUM  Final    Comment: Performed at [facility], [address] [place], [place] [address]  Acid Fast Smear (AFB)     Status: Abnormal   Collection Time: 06/18/19  5:00 AM   Specimen: Sputum  Result Value Ref Range Status   AFB Specimen Processing Concentration  Final   Acid Fast  Smear Positive (A)  Final    Comment: (NOTE) 3+, 4-36 acid-fast bacilli per field at 400X magnification, fluorescent smear POSITIVE SMEAR REPORTED TO Floyce Stakes AT 1018 ON 06/19/19 BY AM. Performed At: [facility] [place], [place] [id number] Rush Farmer MD PR:[id number]    Source (AFB) SPUTUM  Final    Comment: Performed at [facility], [place] [address] [place], [place] [address]         Radiology Studies: No results found.      Scheduled Meds: . buPROPion  150 mg Oral Daily  . clotrimazole   Topical BID  . enoxaparin (LOVENOX) injection  40 mg Subcutaneous Q24H  . ethambutol  1,200 mg Oral Daily  . famotidine  20 mg Oral Daily  . feeding supplement (ENSURE ENLIVE)  237 mL Oral  Q24H  . ferrous sulfate  325 mg Oral BID WC  . ipratropium  2 puff Inhalation TID  . isoniazid  300 mg Oral Daily   And  . vitamin B-6  50 mg Oral Daily  . loratadine  10 mg Oral Q breakfast  . mouth rinse  15 mL Mouth Rinse BID  . mometasone-formoterol  2 puff Inhalation BID  . multivitamin with minerals  1 tablet Oral Daily  . pravastatin  20 mg Oral q1800  . pyrazinamide  1,500 mg Oral Daily  . rifampin  600 mg Oral Daily  . thiamine  100 mg Oral Daily   Continuous Infusions: . sodium chloride Stopped (05/23/19 0250)     LOS: 39 days    Time spent: 69 min    Nicolette Bang, MD [facility]  If 7PM-7AM, please contact night-coverage  06/23/2019, 2:08 PM

## 2019-06-23 NOTE — Progress Notes (Signed)
Physical Therapy Treatment Patient Details Name: Clarence Maldonado MRN: [id number] DOB: 31-Mar-1946 Today's Date: 06/23/2019    History of Present Illness 73 y.o. male with medical history significant of cardiomegaly, stroke, COPD,   CKD stage III, tobacco abuse, DM 2, HTN who presented to the emergency room on 8/10 with progressive weakness over the last 3 months to the point where he was falling over. He has lost approximately 43 pounds in the past 2 months. Pt found to have Pulmonary tuberculosis with cavitation    PT Comments    Assisted pt OOB to amb around room.  General Gait Details: attempted amb without walker (trial) too unsteady to safely perform.  Amb with walker around room several time trial RA.  Very limited distance due to 3/4 dyspnea and sats decreased from 97% at rest to 82% with activity.  HR increased from 87 to 140's brief 150 with activity.  Pt required an extended seated rest break nearly 10 min to recover.  Pt stated, "I get that way when they walk me to the bathroom".  "I have to sit a while".  Pt stated he is not wearling oxygen when "they take me to the bathroom". Attempted a BERG balance test however pt unable to complete due to dyspnea.  Balance activizes performed indicate 100% need for AD RW.  Very unsteady gait.  HIGH FALL RISK.  SATURATION QUALIFICATIONS: (This note is used to comply with regulatory documentation for home oxygen)  Patient Saturations on Room Air at Rest = 97%  Patient Saturations on Room Air while Ambulating 25 feet  = 82% with HR 150  Patient Saturations on 3 Liters of oxygen while Ambulating = 90%  Please briefly explain why patient needs home oxygen:  Pt requires supplemental oxygen during activity   Follow Up Recommendations  SNF;Home health PT;Supervision/Assistance - 24 hour  pt will most likely need ST Rehab at SNF to regain prior level of Indep mobility/activity and was NOT using any AD   Equipment Recommendations  Rolling walker  with 5" wheels    Recommendations for Other Services       Precautions / Restrictions Precautions Precautions: Fall Precaution Comments: monitor O2 Restrictions Weight Bearing Restrictions: No    Mobility  Bed Mobility Overal bed mobility: Modified Independent Bed Mobility: Supine to Sit;Sit to Supine           General bed mobility comments: for safety, lines but otherwise self able  Transfers Overall transfer level: Needs assistance Equipment used: Rolling walker (2 wheeled);None Transfers: Sit to/from Stand Sit to Stand: Supervision;Min guard Stand pivot transfers: Supervision;Min guard       General transfer comment: for safety. VCs hand placement.  increased unsteady with increased fatigue.  Ambulation/Gait Ambulation/Gait assistance: Min guard;Min assist Gait Distance (Feet): 25 Feet Assistive device: Rolling walker (2 wheeled);None Gait Pattern/deviations: Step-through pattern;Trunk flexed;Shuffle;Staggering left;Staggering right Gait velocity: decreased   General Gait Details: attempted amb without walker (trial) too unsteady to safely perform.  Amb with walker around room several time trial RA.  Very limited distance due to 3/4 dyspnea and sats decreased from 97% at rest to 82% with activity.  HR increased from 87 to 140's brief 150 with activity.  Pt required an extended seated rest break nearly 10 min to recover.  Pt stated, "I get that way when they walk me to the bathroom".  "I have to sit a while".  Pt stated he is not wearling oxygen when "they take me to the bathroom".  Stairs             Wheelchair Mobility    Modified Rankin (Stroke Patients Only)       Balance                                            Cognition Arousal/Alertness: Awake/alert Behavior During Therapy: WFL for tasks assessed/performed Overall Cognitive Status: Within Functional Limits for tasks assessed                                  General Comments: very sweet and pleasant but worried about "how long" it will take to recover and "where" he is going      Exercises      General Comments        Pertinent Vitals/Pain Pain Assessment: Faces Faces Pain Scale: Hurts little more Pain Location: back Pain Descriptors / Indicators: Aching;Grimacing Pain Intervention(s): Monitored during session    Home Living                      Prior Function            PT Goals (current goals can now be found in the care plan section) Progress towards PT goals: Progressing toward goals    Frequency    Min 3X/week      PT Plan Current plan remains appropriate    Co-evaluation              AM-PAC PT "6 Clicks" Mobility   Outcome Measure  Help needed turning from your back to your side while in a flat bed without using bedrails?: None Help needed moving from lying on your back to sitting on the side of a flat bed without using bedrails?: None Help needed moving to and from a bed to a chair (including a wheelchair)?: A Little Help needed standing up from a chair using your arms (e.g., wheelchair or bedside chair)?: A Little Help needed to walk in hospital room?: A Little Help needed climbing 3-5 steps with a railing? : A Lot 6 Click Score: 19    End of Session Equipment Utilized During Treatment: Gait belt Activity Tolerance: Other (comment)(dyspnea) Patient left: in bed   PT Visit Diagnosis: Unsteadiness on feet (R26.81);Muscle weakness (generalized) (M62.81);Adult, failure to thrive (R62.7);History of falling (Z91.81);Repeated falls (R29.6)     Time: 5427-0623 PT Time Calculation (min) (ACUTE ONLY): 34 min  Charges:  $Gait Training: 8-22 mins $Therapeutic Activity: 8-22 mins                     Rica Koyanagi  PTA Acute  Rehabilitation Services Pager      [phone] Office      [phone]

## 2019-06-24 LAB — CBC
HCT: 37.3 % — ABNORMAL LOW (ref 39.0–52.0)
Hemoglobin: 11.2 g/dL — ABNORMAL LOW (ref 13.0–17.0)
MCH: 25.7 pg — ABNORMAL LOW (ref 26.0–34.0)
MCHC: 30 g/dL (ref 30.0–36.0)
MCV: 85.6 fL (ref 80.0–100.0)
Platelets: 245 10*3/uL (ref 150–400)
RBC: 4.36 MIL/uL (ref 4.22–5.81)
WBC: 6.7 10*3/uL (ref 4.0–10.5)
nRBC: 0 % (ref 0.0–0.2)

## 2019-06-24 LAB — BASIC METABOLIC PANEL
Anion gap: 10 (ref 5–15)
BUN: 32 mg/dL — ABNORMAL HIGH (ref 8–23)
CO2: 23 mmol/L (ref 22–32)
Calcium: 8.7 mg/dL — ABNORMAL LOW (ref 8.9–10.3)
Chloride: 102 mmol/L (ref 98–111)
Creatinine, Ser: 1.39 mg/dL — ABNORMAL HIGH (ref 0.61–1.24)
GFR calc Af Amer: 58 mL/min — ABNORMAL LOW (ref >60)
GFR calc non Af Amer: 50 mL/min — ABNORMAL LOW (ref >60)
Glucose, Bld: 90 mg/dL (ref 70–99)
Potassium: 3.9 mmol/L (ref 3.5–5.1)
Sodium: 135 mmol/L (ref 135–145)

## 2019-06-24 NOTE — Progress Notes (Signed)
Occupational Therapy Treatment Patient Details Name: Clarence Maldonado MRN: [id number] DOB: 1946/08/28 Today's Date: 06/24/2019    History of present illness 73 y.o. male with medical history significant of cardiomegaly, stroke, COPD,   CKD stage III, tobacco abuse, DM 2, HTN who presented to the emergency room on 8/10 with progressive weakness over the last 3 months to the point where he was falling over. He has lost approximately 43 pounds in the past 2 months. Pt found to have Pulmonary tuberculosis with cavitation   OT comments  Tolerated walking to bathroom and working on sit to stand while therapist washed his hair.  After recovering, worked on [employer]  Provided level 2 for biceps. Has level one for shoulders.  Did not get good reading on sats.  Appears HR in 130s and sats in mid 80s mid session using 2 liters 02   Follow Up Recommendations  SNF;Supervision/Assistance - 24 hour    Equipment Recommendations  3 in 1 bedside commode    Recommendations for Other Services      Precautions / Restrictions Precautions Precautions: Fall Precaution Comments: monitor O2 Restrictions Weight Bearing Restrictions: No       Mobility Bed Mobility         Supine to sit: Modified independent (Device/Increase time)        Transfers   Equipment used: Rolling walker (2 wheeled);None   Sit to Stand: Supervision;Min guard         General transfer comment: for safety. VCs hand placement.  increased unsteady with increased fatigue.    Balance     Sitting balance-Leahy Scale: Good         Standing balance comment: bil UE support on sink                           ADL either performed or assessed with clinical judgement   ADL                   Upper Body Dressing : Minimal assistance Upper Body Dressing Details (indicate cue type and reason): tie gown     Toilet Transfer: Minimal assistance;Ambulation;BSC;RW             General ADL  Comments: ambulated to bathroom sink. Sit to stand to have hair washed at sink with rest breaks on commode. Pt had cold hands and had a poor signal for sats. Best reading looked like HR in 130s and sats 84/85% on 2 liters.  Encouraged rest breaks. Pt needed min A for steadying when walking. He tends to pick up walker and he tried to lift it through tight space. Cued to side step for safety.     Vision       Perception     Praxis      Cognition Arousal/Alertness: Awake/alert Behavior During Therapy: WFL for tasks assessed/performed Overall Cognitive Status: Within Functional Limits for tasks assessed                                          Exercises Exercises: (brought level 2 for biceps) General Exercises - Upper Extremity Shoulder Horizontal ABduction: AROM;Both;Theraband;10 reps Theraband Level (Shoulder Horizontal Abduction): Level 1 (Yellow) Theraband Level (Shoulder Horizontal Adduction): Level 2 (Red) Elbow Flexion: AROM;Both;Theraband;10 reps Theraband Level (Elbow Flexion): Level 2 (Red)   Shoulder Instructions  General Comments      Pertinent Vitals/ Pain       Pain Assessment: No/denies pain  Home Living                                          Prior Functioning/Environment              Frequency  Min 2X/week        Progress Toward Goals  OT Goals(current goals can now be found in the care plan section)  Progress towards OT goals: Progressing toward goals     Plan      Co-evaluation                 AM-PAC OT "6 Clicks" Daily Activity     Outcome Measure   Help from another person eating meals?: None Help from another person taking care of personal grooming?: A Little Help from another person toileting, which includes using toliet, bedpan, or urinal?: A Little Help from another person bathing (including washing, rinsing, drying)?: A Little Help from another person to put on and taking off  regular upper body clothing?: A Little Help from another person to put on and taking off regular lower body clothing?: A Little 6 Click Score: 19    End of Session    OT Visit Diagnosis: Unsteadiness on feet (R26.81)   Activity Tolerance Patient tolerated treatment well   Patient Left in bed;with bed alarm set;with call bell/phone within reach   Nurse Communication          Time: 1400-1446 OT Time Calculation (min): 46 min  Charges: OT General Charges $OT Visit: 1 Visit OT Treatments $Self Care/Home Management : 23-37 mins $Therapeutic Exercise: 8-22 mins  [name], OTR/L Acute Rehabilitation Services [phone] [facility] pager [phone] office 06/24/2019   Jenee Spaugh 06/24/2019, 3:21 PM

## 2019-06-24 NOTE — Progress Notes (Signed)
PROGRESS NOTE    Clarence LaosRobert M Maldonado  ZOX:[id number] DOB: July 31, 1946 DOA: 05/15/2019 PCP: Clarence Maldonado, Clarence Maldonado, Clarence Maldonado   Brief Narrative:  Clarence Friendsobert M Maldonado a 73 y.o.male with history of stroke, COPD, CKD stage III, tobacco use, diabetes mellitus type 2, hypertension. Patient presented secondary to weakness and frequent falls, found to have pulmonary TB   Assessment & Plan:   Principal Problem:   Pulmonary tuberculosis with cavitation Active Problems:   Essential hypertension   CKD (chronic kidney disease) stage 3, GFR 30-59 ml/min (HCC)   Hyperlipidemia   Tobacco use disorder   Controlled type 2 diabetes mellitus with stage 3 chronic kidney disease, without long-term current use of insulin (HCC)   Weight loss   Hyperlipidemia associated with type 2 diabetes mellitus (HCC)   Anemia   Moderate protein-calorie malnutrition (HCC)   Rash   Fatigue   Lung infiltrate   Pulmonary TB C/W  RIPE therapy per ID.  Last AFB cuture (9/1) still pending, but AFB smear positive. Discussed with ID, recommend weekly AFB smear to ensure clearance and monitor trajectory. Most recent positive AFB smear was 9/13,  3+  Patient continues to make good progress in functional ability. -Continue rifampin, isoniazid, pyrazinamide, ethambutol. -Significant disease burden based on the last sputum culture. -AFB sputum smear sent out on 9/11/2020negative. 06/18/2019 is positive. Per ID we will obtain 2 sputum specimens for AFB smear and culture every 2 weeks. dispo pending ID  Acute kidney injury. On chronic kidney disease stage III Likely from poor p.o. intake. On admission serum creatinine. 1.77.. Currently 1.45.  Dysphagia Evaluated by speech therapy. MBS significant for evidence of moderate oropharyngeal dysphagia with sensorimotor deficit and hypomotility of the pharynx. Patient deemed a high aspiration risk and placed on a dysphagia 1 diet. -Initially patient was on dysphagia diet. Currently on  regular diet. This show significant improvement in patient's deconditioning. -Speech therapy consult appreciated.  Peripheral neuropathy Concern this could be related to RIPE therapy. Started on B6.  Symptoms resolved for now.  Left atrial mobile mass Echocardiogram on 05/16/2019 showed a possible left atrial mobile mass. 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 Outpatient cardiology follow-up.  Non-sustained V-tach Resolved. Hypokalemia and magnesium likely contributed. Biweekly labs.  Hypokalemia/Hypomagnesemia Shown again. We will replace.  Iron deficiency anemia -Continue iron supplementation No bleeding reported.  Diabetes mellitus, type 2, controlled without any complication. -Hemoglobin A1c 6.3 on 05/16/2019. Blood sugars are well controlled. Continue SSI  Weight loss Nutrition/PT consulted improvement in the hospital 25+ pound weight gain.  Insomnia. Trazodone at nightly as needed. We will continue it on discharge.  DVT prophylaxis: Lovenox SQ  Code Status: FULL    Code Status Orders  (From admission, onward)         Start     Ordered   05/16/19 0038  Full code  Continuous     05/16/19 0038        Code Status History    This patient has a current code status but no historical code status.   Advance Care Planning Activity     Family Communication: Discussed in detail with pt  Disposition Plan:   [facility] inpatient for cont tx of active TB, no safe d/c plan Consults called: None Admission status: Inpatient   Consultants:   ID  Procedures:  Dg Swallowing Func-speech Pathology  Result Date: 05/25/2019 CLINICAL DATA:  Dysphagia EXAM: ESOPHOGRAM/BARIUM SWALLOW.  Swallowing function TECHNIQUE: A swallowing function exam under fluoroscopy was performed under the direction  of speech pathologist utilizing multiple consistencies of barium to assess the swallowing mechanism. Single contrast examination was performed using  thin  barium. FLUOROSCOPY TIME:  Fluoroscopy Time:  4.8 minutes Radiation Exposure Index (if provided by the fluoroscopic device): 44.6 mGy Number of Acquired Spot Images: 0 full exposures COMPARISON:  None. FINDINGS: Swallow study: Penetration with trace aspiration with thin liquids. Please refer to speech language pathology note for a full detailed report. Esophagram: A single contrast esophagram was performed with patient seated in a chair following a swallow study. Intermittent tertiary contractions were observed under intermittent fluoroscopy. Evaluation of fine mucosal detail was limited secondary to difficulties with patient positioning and single-contrast technique. No persisting structures. No obvious mucosal ulcerations. Small hiatal hernia. A 13 mm barium tablet traversed the esophagus into the stomach without delay. IMPRESSION: 1. Trace aspiration with thin liquids on swallow study. Please refer to speech language pathology note for full detailed report. 2. Limited single-contrast esophagram without persisting stricture or obvious mucosal abnormality. 3. Small hiatal hernia. Electronically Signed   By: Davina Poke M.D.   On: 05/25/2019 16:48   Dg Esophagus W Single Cm (sol Or Thin Ba)  Result Date: 05/25/2019 CLINICAL DATA:  Dysphagia EXAM: ESOPHOGRAM/BARIUM SWALLOW.  Swallowing function TECHNIQUE: A swallowing function exam under fluoroscopy was performed under the direction of speech pathologist utilizing multiple consistencies of barium to assess the swallowing mechanism. Single contrast examination was performed using  thin barium. FLUOROSCOPY TIME:  Fluoroscopy Time:  4.8 minutes Radiation Exposure Index (if provided by the fluoroscopic device): 44.6 mGy Number of Acquired Spot Images: 0 full exposures COMPARISON:  None. FINDINGS: Swallow study: Penetration with trace aspiration with thin liquids. Please refer to speech language pathology note for a full detailed report. Esophagram: A single  contrast esophagram was performed with patient seated in a chair following a swallow study. Intermittent tertiary contractions were observed under intermittent fluoroscopy. Evaluation of fine mucosal detail was limited secondary to difficulties with patient positioning and single-contrast technique. No persisting structures. No obvious mucosal ulcerations. Small hiatal hernia. A 13 mm barium tablet traversed the esophagus into the stomach without delay. IMPRESSION: 1. Trace aspiration with thin liquids on swallow study. Please refer to speech language pathology note for full detailed report. 2. Limited single-contrast esophagram without persisting stricture or obvious mucosal abnormality. 3. Small hiatal hernia. Electronically Signed   By: Davina Poke M.D.   On: 05/25/2019 16:48     Antimicrobials:   RIPE tx    Subjective: No acute events overnight, still sob with exerton  Objective: Vitals:   06/23/19 1327 06/23/19 2137 06/24/19 0657 06/24/19 1328  BP: (!) 146/95 (!) 142/79 124/80 133/76  Pulse: 100 91 (!) 106 97  Resp: 18 16 16 18   Temp: 98.2 F (36.8 C) 97.9 F (36.6 C) 98 F (36.7 C) 98.4 F (36.9 C)  TempSrc: Oral Oral Oral   SpO2: 98% 94% 93% 98%  Weight:   62.2 kg   Height:        Intake/Output Summary (Last 24 hours) at 06/24/2019 1521 Last data filed at 06/24/2019 1200 Gross per 24 hour  Intake 60 ml  Output 700 ml  Net -640 ml   Filed Weights   06/22/19 0557 06/23/19 0526 06/24/19 0657  Weight: 63.6 kg 62.5 kg 62.2 kg    Examination:  General exam: Appears calm and comfortable  Respiratory system: rhonchi bilat, trace wheezing Cardiovascular system: S1 & S2 heard, RRR. No JVD, murmurs, rubs, gallops or clicks. No pedal  edema. Gastrointestinal system: Abdomen is nondistended, soft and nontender. No organomegaly or masses felt. Normal bowel sounds heard. Central nervous system: Alert and oriented. No focal neurological deficits. Extremities: Symmetric 5 x 5  power. Skin: No rashes, lesions or ulcers Psychiatry: Judgement and insight appear normal. Mood & affect appropriate.     Data Reviewed: I have personally reviewed following labs and imaging studies  CBC: Recent Labs  Lab 06/19/19 0530 06/21/19 0450 06/22/19 0359 06/23/19 0345 06/24/19 0327  WBC 6.9 7.1 5.9 6.1 6.7  HGB 11.7* 11.1* 11.2* 11.4* 11.2*  HCT 39.2 37.8* 37.2* 37.9* 37.3*  MCV 87.1 86.3 85.7 86.1 85.6  PLT 281 235 256 242 245   Basic Metabolic Panel: Recent Labs  Lab 06/19/19 0530 06/20/19 0419 06/21/19 0450 06/22/19 0359 06/23/19 0345 06/24/19 0327  NA 137 136 134* 136 135 135  K 4.5 4.4 3.7 3.7 4.0 3.9  CL 103 105 106 105 103 102  CO2 23 21* 21* 21* 23 23  GLUCOSE 148* 92 89 91 92 90  BUN 37* 37* 31* 30* 34* 32*  CREATININE 1.63* 1.62* 1.45* 1.43* 1.50* 1.39*  CALCIUM 9.0 8.3* 8.6* 8.6* 8.7* 8.7*  MG 2.5*  --  2.0  --   --   --    GFR: Estimated Creatinine Clearance: 42.3 mL/min (A) (by C-G formula based on SCr of 1.39 mg/dL (H)). Liver Function Tests: Recent Labs  Lab 06/21/19 0450  AST 16  ALT 12  ALKPHOS 70  BILITOT 0.5  PROT 6.6  ALBUMIN 2.6*   No results for input(s): LIPASE, AMYLASE in the last 168 hours. No results for input(s): AMMONIA in the last 168 hours. Coagulation Profile: No results for input(s): INR, PROTIME in the last 168 hours. Cardiac Enzymes: No results for input(s): CKTOTAL, CKMB, CKMBINDEX, TROPONINI in the last 168 hours. BNP (last 3 results) No results for input(s): PROBNP in the last 8760 hours. HbA1C: No results for input(s): HGBA1C in the last 72 hours. CBG: Recent Labs  Lab 06/20/19 0442 06/21/19 0515 06/22/19 0850 06/23/19 0854  GLUCAP 78 70 131* 98   Lipid Profile: No results for input(s): CHOL, HDL, LDLCALC, TRIG, CHOLHDL, LDLDIRECT in the last 72 hours. Thyroid Function Tests: No results for input(s): TSH, T4TOTAL, FREET4, T3FREE, THYROIDAB in the last 72 hours. Anemia Panel: No results for  input(s): VITAMINB12, FOLATE, FERRITIN, TIBC, IRON, RETICCTPCT in the last 72 hours. Sepsis Labs: No results for input(s): PROCALCITON, LATICACIDVEN in the last 168 hours.  Recent Results (from the past 240 hour(s))  Acid Fast Smear (AFB)     Status: None   Collection Time: 06/16/19 11:07 AM   Specimen: Sputum  Result Value Ref Range Status   AFB Specimen Processing Concentration  Final   Acid Fast Smear Negative  Final    Comment: (NOTE) Performed At: [facility] [address] [place], [place] [id number] Jolene SchimkeNagendra Sanjai MD WU:[phone]    Source (AFB) SPUTUM  Final    Comment: Performed at [facility], [address] [place], [place] [address]  Acid Fast Smear (AFB)     Status: Abnormal   Collection Time: 06/18/19  5:00 AM   Specimen: Sputum  Result Value Ref Range Status   AFB Specimen Processing Concentration  Final   Acid Fast Smear Positive (A)  Final    Comment: (NOTE) 3+, 4-36 acid-fast bacilli per field at 400X magnification, fluorescent smear POSITIVE SMEAR REPORTED TO Solmon IceVINCENT WILKENS AT 1018 ON 06/19/19 BY AM. Performed At: [facility]  [facility] [address] [place], [place] [id number] Jolene Schimke MD JI:[id number]    Source (AFB) SPUTUM  Final    Comment: Performed at [facility], [address] [place], [place] [address]         Radiology Studies: No results found.      Scheduled Meds: . buPROPion  150 mg Oral Daily  . clotrimazole   Topical BID  . enoxaparin (LOVENOX) injection  40 mg Subcutaneous Q24H  . ethambutol  1,200 mg Oral Daily  . famotidine  20 mg Oral Daily  . feeding supplement (ENSURE ENLIVE)  237 mL Oral Q24H  . ferrous sulfate  325 mg Oral BID WC  . ipratropium  2 puff Inhalation TID  . isoniazid  300 mg Oral Daily   And  . vitamin B-6  50 mg Oral Daily  . loratadine  10 mg Oral Q breakfast  . mouth rinse  15 mL Mouth Rinse BID  . mometasone-formoterol  2 puff Inhalation  BID  . multivitamin with minerals  1 tablet Oral Daily  . pravastatin  20 mg Oral q1800  . pyrazinamide  1,500 mg Oral Daily  . rifampin  600 mg Oral Daily  . thiamine  100 mg Oral Daily   Continuous Infusions: . sodium chloride Stopped (05/23/19 0250)     LOS: 40 days    Time spent: 25 min    Burke Keels, MD [facility]  If 7PM-7AM, please contact night-coverage  06/24/2019, 3:21 PM

## 2019-06-25 LAB — CBC
HCT: 39.1 % (ref 39.0–52.0)
Hemoglobin: 11.7 g/dL — ABNORMAL LOW (ref 13.0–17.0)
MCH: 25.7 pg — ABNORMAL LOW (ref 26.0–34.0)
MCHC: 29.9 g/dL — ABNORMAL LOW (ref 30.0–36.0)
MCV: 85.7 fL (ref 80.0–100.0)
Platelets: 230 10*3/uL (ref 150–400)
RBC: 4.56 MIL/uL (ref 4.22–5.81)
RDW: 24 % — ABNORMAL HIGH (ref 11.5–15.5)
WBC: 6 10*3/uL (ref 4.0–10.5)
nRBC: 0 % (ref 0.0–0.2)

## 2019-06-25 LAB — BASIC METABOLIC PANEL
Anion gap: 11 (ref 5–15)
BUN: 34 mg/dL — ABNORMAL HIGH (ref 8–23)
CO2: 23 mmol/L (ref 22–32)
Calcium: 9.1 mg/dL (ref 8.9–10.3)
Chloride: 100 mmol/L (ref 98–111)
Creatinine, Ser: 1.33 mg/dL — ABNORMAL HIGH (ref 0.61–1.24)
GFR calc Af Amer: >60 mL/min (ref >60)
GFR calc non Af Amer: 53 mL/min — ABNORMAL LOW (ref >60)
Glucose, Bld: 102 mg/dL — ABNORMAL HIGH (ref 70–99)
Potassium: 4.1 mmol/L (ref 3.5–5.1)
Sodium: 134 mmol/L — ABNORMAL LOW (ref 135–145)

## 2019-06-25 LAB — GLUCOSE, CAPILLARY: Glucose-Capillary: 76 mg/dL (ref 70–99)

## 2019-06-25 NOTE — Progress Notes (Signed)
PROGRESS NOTE    Clarence Maldonado  DGU:[id number] DOB: 02/21/1946 DOA: 05/15/2019 PCP: Gayland Curry, DO   Brief Narrative:  Clarence Maldonado a 73 y.o.male with history of stroke, COPD, CKD stage III, tobacco use, diabetes mellitus type 2, hypertension. Patient presented secondary to weakness and frequent falls, found to have pulmonary TB   Assessment & Plan:   Principal Problem:   Pulmonary tuberculosis with cavitation Active Problems:   Essential hypertension   CKD (chronic kidney disease) stage 3, GFR 30-59 ml/min (HCC)   Hyperlipidemia   Tobacco use disorder   Controlled type 2 diabetes mellitus with stage 3 chronic kidney disease, without long-term current use of insulin (HCC)   Weight loss   Hyperlipidemia associated with type 2 diabetes mellitus (HCC)   Anemia   Moderate protein-calorie malnutrition (HCC)   Rash   Fatigue   Lung infiltrate   Pulmonary TB C/W  RIPE therapy per ID.  Last AFB cuture (9/1) still pending, but AFB smear positive. Discussed with ID, recommend weekly AFB smear to ensure clearance and monitor trajectory. Most recentpositive AFB smear was 9/13,3+  Patient continues to make good progress in functional ability. -Continue rifampin, isoniazid, pyrazinamide, ethambutol. -Significant disease burden based on the last sputum culture. -AFB sputum smear sent out on 9/11/2020negative. 06/18/2019 is positive. Per ID we will obtain 2 sputum specimens for AFB smear and culture every 2 weeks. dispo pending ID  Acute kidney injury. On chronic kidney disease stage III Likely from poor p.o. intake. On admission serum creatinine. 1.77.. Currently 1.45.  Dysphagia Evaluated by speech therapy. MBS significant for evidence of moderate oropharyngeal dysphagia with sensorimotor deficit and hypomotility of the pharynx. Patient deemed a high aspiration risk and placed on a dysphagia 1 diet. -Initially patient was on dysphagia diet. Currently on  regular diet. This show significant improvement in patient's deconditioning. -Speech therapy consult appreciated.  Peripheral neuropathy Concern this could be related to RIPE therapy. Started on B6.  Symptoms resolved for now.  Left atrial mobile mass Echocardiogram on 05/16/2019 showed a possible left atrial mobile mass. 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 Outpatient cardiology follow-up.  Non-sustained V-tach Resolved. Hypokalemia and magnesium likely contributed. Biweekly labs.  Hypokalemia/Hypomagnesemia Shown again. We will replace.  Iron deficiency anemia -Continue iron supplementation No bleeding reported.  Diabetes mellitus, type 2, controlled without any complication. -Hemoglobin A1c 6.3 on 05/16/2019. Blood sugars are well controlled. Continue SSI  Weight loss Nutrition/PT consulted improvement in the hospital 25+ pound weight gain.  Insomnia. Trazodone at nightly as needed. We will continue it on discharge.  DVT prophylaxis: Lovenox SQ  Code Status: full    Code Status Orders  (From admission, onward)         Start     Ordered   05/16/19 0038  Full code  Continuous     05/16/19 0038        Code Status History    This patient has a current code status but no historical code status.   Advance Care Planning Activity     Family Communication: Discussed in detail with patient Disposition Plan:   Patient remained inpatient for continued treatment of active TB Consults called: None Admission status: Inpatient   Consultants:   ID  Procedures:   No results found.  Antimicrobials:   RIPE Tx   Subjective: No changes overnight Reports feeling tired   Objective: Vitals:   06/24/19 1328 06/25/19 0017 06/25/19 0456 06/25/19 1139  BP: 133/76 138/79 Marland Kitchen)  143/75 (!) 152/87  Pulse: 97 90 89 (!) 120  Resp: 18 18 (!) 22 (!) 21  Temp: 98.4 F (36.9 C) 98.4 F (36.9 C) 98.2 F (36.8 C) 99.4 F (37.4 C)  TempSrc:   Oral Oral Oral  SpO2: 98% 99% 95% 94%  Weight:   64.8 kg   Height:        Intake/Output Summary (Last 24 hours) at 06/25/2019 1328 Last data filed at 06/25/2019 1252 Gross per 24 hour  Intake 420 ml  Output 1800 ml  Net -1380 ml   Filed Weights   06/23/19 0526 06/24/19 0657 06/25/19 0456  Weight: 62.5 kg 62.2 kg 64.8 kg    Examination:  General exam: Appears calm and comfortable  Respiratory system: Rhonchi bilaterally, no wheezing. Cardiovascular system: S1 & S2 heard, RRR. No JVD, murmurs, rubs, gallops or clicks. No pedal edema. Gastrointestinal system: Abdomen is nondistended, soft and nontender. No organomegaly or masses felt. Normal bowel sounds heard. Central nervous system: Alert and oriented. No focal neurological deficits. Extremities: wwp, no edema Skin: No rashes, lesions or ulcers Psychiatry: Judgement and insight appear normal. Mood & affect appropriate.     Data Reviewed: I have personally reviewed following labs and imaging studies  CBC: Recent Labs  Lab 06/21/19 0450 06/22/19 0359 06/23/19 0345 06/24/19 0327 06/25/19 0336  WBC 7.1 5.9 6.1 6.7 6.0  HGB 11.1* 11.2* 11.4* 11.2* 11.7*  HCT 37.8* 37.2* 37.9* 37.3* 39.1  MCV 86.3 85.7 86.1 85.6 85.7  PLT 235 256 242 245 230   Basic Metabolic Panel: Recent Labs  Lab 06/19/19 0530  06/21/19 0450 06/22/19 0359 06/23/19 0345 06/24/19 0327 06/25/19 0336  NA 137   < > 134* 136 135 135 134*  K 4.5   < > 3.7 3.7 4.0 3.9 4.1  CL 103   < > 106 105 103 102 100  CO2 23   < > 21* 21* 23 23 23   GLUCOSE 148*   < > 89 91 92 90 102*  BUN 37*   < > 31* 30* 34* 32* 34*  CREATININE 1.63*   < > 1.45* 1.43* 1.50* 1.39* 1.33*  CALCIUM 9.0   < > 8.6* 8.6* 8.7* 8.7* 9.1  MG 2.5*  --  2.0  --   --   --   --    < > = values in this interval not displayed.   GFR: Estimated Creatinine Clearance: 45.3 mL/min (A) (by C-G formula based on SCr of 1.33 mg/dL (H)). Liver Function Tests: Recent Labs  Lab 06/21/19 0450   AST 16  ALT 12  ALKPHOS 70  BILITOT 0.5  PROT 6.6  ALBUMIN 2.6*   No results for input(s): LIPASE, AMYLASE in the last 168 hours. No results for input(s): AMMONIA in the last 168 hours. Coagulation Profile: No results for input(s): INR, PROTIME in the last 168 hours. Cardiac Enzymes: No results for input(s): CKTOTAL, CKMB, CKMBINDEX, TROPONINI in the last 168 hours. BNP (last 3 results) No results for input(s): PROBNP in the last 8760 hours. HbA1C: No results for input(s): HGBA1C in the last 72 hours. CBG: Recent Labs  Lab 06/20/19 0442 06/21/19 0515 06/22/19 0850 06/23/19 0854 06/25/19 0450  GLUCAP 78 70 131* 98 76   Lipid Profile: No results for input(s): CHOL, HDL, LDLCALC, TRIG, CHOLHDL, LDLDIRECT in the last 72 hours. Thyroid Function Tests: No results for input(s): TSH, T4TOTAL, FREET4, T3FREE, THYROIDAB in the last 72 hours. Anemia Panel: No results for input(s): VITAMINB12, FOLATE,  FERRITIN, TIBC, IRON, RETICCTPCT in the last 72 hours. Sepsis Labs: No results for input(s): PROCALCITON, LATICACIDVEN in the last 168 hours.  Recent Results (from the past 240 hour(s))  Acid Fast Smear (AFB)     Status: None   Collection Time: 06/16/19 11:07 AM   Specimen: Sputum  Result Value Ref Range Status   AFB Specimen Processing Concentration  Final   Acid Fast Smear Negative  Final    Comment: (NOTE) Performed At: [facility] [address] [place], [place] [id number] Jolene SchimkeNagendra Sanjai MD KG:[id number]:[phone]    Source (AFB) SPUTUM  Final    Comment: Performed at [facility], [address] [place], [place] [address]  Acid Fast Smear (AFB)     Status: Abnormal   Collection Time: 06/18/19  5:00 AM   Specimen: Sputum  Result Value Ref Range Status   AFB Specimen Processing Concentration  Final   Acid Fast Smear Positive (A)  Final    Comment: (NOTE) 3+, 4-36 acid-fast bacilli per field at 400X magnification, fluorescent smear POSITIVE SMEAR  REPORTED TO Solmon IceVINCENT WILKENS AT 1018 ON 06/19/19 BY AM. Performed At: [facility] [address] [place], [place] [id number] Jolene SchimkeNagendra Sanjai MD OV:[id number]:[phone]    Source (AFB) SPUTUM  Final    Comment: Performed at [facility], [address] [place], [place] [address]         Radiology Studies: No results found.      Scheduled Meds: . buPROPion  150 mg Oral Daily  . clotrimazole   Topical BID  . enoxaparin (LOVENOX) injection  40 mg Subcutaneous Q24H  . ethambutol  1,200 mg Oral Daily  . famotidine  20 mg Oral Daily  . feeding supplement (ENSURE ENLIVE)  237 mL Oral Q24H  . ferrous sulfate  325 mg Oral BID WC  . ipratropium  2 puff Inhalation TID  . isoniazid  300 mg Oral Daily   And  . vitamin B-6  50 mg Oral Daily  . loratadine  10 mg Oral Q breakfast  . mouth rinse  15 mL Mouth Rinse BID  . mometasone-formoterol  2 puff Inhalation BID  . multivitamin with minerals  1 tablet Oral Daily  . pravastatin  20 mg Oral q1800  . pyrazinamide  1,500 mg Oral Daily  . rifampin  600 mg Oral Daily  . thiamine  100 mg Oral Daily   Continuous Infusions: . sodium chloride Stopped (05/23/19 0250)     LOS: 41 days    Time spent: 25 min    Burke Keelshristopher Bilaal Leib, MD [facility]  If 7PM-7AM, please contact night-coverage  06/25/2019, 1:28 PM

## 2019-06-26 LAB — GLUCOSE, CAPILLARY: Glucose-Capillary: 95 mg/dL (ref 70–99)

## 2019-06-26 NOTE — Progress Notes (Signed)
Patient ID: WEST BOOMERSHINE, male   DOB: 1946/02/25, 73 y.o.   MRN: [id number]         [facility]  Date of Admission:  05/15/2019           Day 39 TB therapy (RIPE) ASSESSMENT: He is improving slowly.  His last 3 sputum cultures remain negative but are not finalized.  His last sputum AFB smear 06/18/2019 was still 3+ positive.  He will need 2 more sputum specimens submitted for AFB smear and culture at the end of this week.  He is not strong enough to go home under health department supervision and it is still potentially contagious so he cannot be transferred to a skilled nursing facility.  He needs to remain in the hospital on directly observed TB therapy.  PLAN: 1. Continue RIPE 2. I will follow-up in 1 week  Principal Problem:   Pulmonary tuberculosis with cavitation Active Problems:   Rash   Essential hypertension   CKD (chronic kidney disease) stage 3, GFR 30-59 ml/min (HCC)   Hyperlipidemia   Tobacco use disorder   Controlled type 2 diabetes mellitus with stage 3 chronic kidney disease, without long-term current use of insulin (HCC)   Weight loss   Hyperlipidemia associated with type 2 diabetes mellitus (HCC)   Anemia   Moderate protein-calorie malnutrition (HCC)   Fatigue   Lung infiltrate   Scheduled Meds: . buPROPion  150 mg Oral Daily  . clotrimazole   Topical BID  . enoxaparin (LOVENOX) injection  40 mg Subcutaneous Q24H  . ethambutol  1,200 mg Oral Daily  . famotidine  20 mg Oral Daily  . feeding supplement (ENSURE ENLIVE)  237 mL Oral Q24H  . ferrous sulfate  325 mg Oral BID WC  . ipratropium  2 puff Inhalation TID  . isoniazid  300 mg Oral Daily   And  . vitamin B-6  50 mg Oral Daily  . loratadine  10 mg Oral Q breakfast  . mouth rinse  15 mL Mouth Rinse BID  . mometasone-formoterol  2 puff Inhalation BID  . multivitamin with minerals  1 tablet Oral Daily  . pravastatin  20 mg Oral q1800  . pyrazinamide  1,500 mg Oral Daily   . rifampin  600 mg Oral Daily  . thiamine  100 mg Oral Daily   Continuous Infusions: . sodium chloride Stopped (05/23/19 0250)   PRN Meds:.sodium chloride, acetaminophen **OR** acetaminophen, alum & mag hydroxide-simeth, dextrose, HYDROcodone-acetaminophen, hydrocortisone cream, levalbuterol, loperamide, magic mouthwash w/lidocaine, ondansetron **OR** ondansetron (ZOFRAN) IV, phenol, Resource ThickenUp Clear, traZODone   SUBJECTIVE: He is feeling much better.  He still gets short of breath when up walking but he is feeling stronger.  His appetite is improving.  Review of Systems: Review of Systems  Constitutional: Positive for malaise/fatigue. Negative for chills, diaphoresis, fever and weight loss.  Respiratory: Positive for cough, sputum production and shortness of breath. Negative for hemoptysis.   Cardiovascular: Negative for chest pain.  Gastrointestinal: Negative for abdominal pain, diarrhea, nausea and vomiting.    No Known Allergies  OBJECTIVE: Vitals:   06/25/19 1818 06/25/19 2001 06/26/19 0557 06/26/19 1334  BP: 140/85 (!) 148/87 (!) 145/87 135/71  Pulse: 100 98 97 (!) 108  Resp: 20 20 (!) 22 16  Temp: 97.7 F (36.5 C) 98.2 F (36.8 C) 98.2 F (36.8 C) 97.7 F (36.5 C)  TempSrc: Axillary Oral Oral Oral  SpO2: 96% 96% 94% 96%  Weight:   61.9 kg  Height:       Body mass index is 22.03 kg/m.  Physical Exam Constitutional:      Comments: He is talkative and in good spirits.    Cardiovascular:     Rate and Rhythm: Normal rate and regular rhythm.     Heart sounds: No murmur.  Pulmonary:     Effort: Pulmonary effort is normal.     Breath sounds: Normal breath sounds. No wheezing, rhonchi or rales.  Abdominal:     Palpations: Abdomen is soft.     Tenderness: There is no abdominal tenderness.  Skin:    Findings: No erythema.  Psychiatric:        Mood and Affect: Mood normal.     Lab Results Lab Results  Component Value Date   WBC 6.0 06/25/2019   HGB  11.7 (L) 06/25/2019   HCT 39.1 06/25/2019   MCV 85.7 06/25/2019   PLT 230 06/25/2019    Lab Results  Component Value Date   CREATININE 1.33 (H) 06/25/2019   BUN 34 (H) 06/25/2019   NA 134 (L) 06/25/2019   K 4.1 06/25/2019   CL 100 06/25/2019   CO2 23 06/25/2019    Lab Results  Component Value Date   ALT 12 06/21/2019   AST 16 06/21/2019   ALKPHOS 70 06/21/2019   BILITOT 0.5 06/21/2019     Microbiology: Recent Results (from the past 240 hour(s))  Acid Fast Smear (AFB)     Status: Abnormal   Collection Time: 06/18/19  5:00 AM   Specimen: Sputum  Result Value Ref Range Status   AFB Specimen Processing Concentration  Final   Acid Fast Smear Positive (A)  Final    Comment: (NOTE) 3+, 4-36 acid-fast bacilli per field at 400X magnification, fluorescent smear POSITIVE SMEAR REPORTED TO Floyce Stakes AT 1018 ON 06/19/19 BY AM. Performed At: [facility] [address] [place], [place] [id number] Rush Farmer MD AN:[id number]    Source (AFB) SPUTUM  Final    Comment: Performed at [facility], [place] [address], [place], [place] [address]    Michel Bickers, [place] [facility] [phone] pager   [phone] cell 06/26/2019, 4:46 PM

## 2019-06-26 NOTE — Progress Notes (Signed)
Nutrition Follow-up  RD working remotely.   DOCUMENTATION CODES:   Not applicable  INTERVENTION:  - continue Ensure Enlive once/day and Magic Cup BID. - continue to encourage PO intakes. - RD will sign off at this time; please consult should nutrition-related needs arise.    NUTRITION DIAGNOSIS:   Inadequate oral intake related to poor appetite as evidenced by per patient/family report. -resolved; no other nutrition diagnosis at this time.  GOAL:   Patient will meet greater than or equal to 90% of their needs -met  MONITOR:   PO intake, Supplement acceptance, Labs, Weight trends, I & O's  ASSESSMENT:   73 y.o. male with medical history significant of cardiomegaly, stroke, COPD,   CKD stage III, tobacco abuse, DM 2, HTN,  Admitted for cavitary lesions unclear etiology work-up for TB versus malignancy  Weight remains stable. Per flow sheet, patient consumed 80% of breakfast and 50% of lunch on 9/17 (total of 960 kcal, 31 grams protein); 100% of breakfast on 9/18 (882 kcal, 33 grams protein); 100% of all meals on 9/20 (total of 2192 kcal, 101 grams protein). Patient has accepted all bottles of Ensure over the past week (350 kcal, 20 grams protein each).   Patient with TB and remains inpatient for treatment of the same.    Labs reviewed; CBG: 95 mg/dl, Na: 134 mmol/l, BUN: 34 mg/dl, creatinine: 1.33 mg/dl, GFR: 53 ml/min.  Medications reviewed; 20 mg pepcid/day, 325 mg ferrous sulfate BID, 50 mg vitamin B6/day, daily multivitamin with minerals, 100 mg thiamine/day. Marland Kitchen      NUTRITION - FOCUSED PHYSICAL EXAM:  unable to complete for patient on airborne precautions.   Diet Order:   Diet Order            Diet regular Room service appropriate? Yes; Fluid consistency: Thin  Diet effective now              EDUCATION NEEDS:   No education needs have been identified at this time  Skin:  Skin Assessment: Reviewed RN Assessment  Last BM:  9/21  Height:   Ht Readings  from Last 1 Encounters:  05/16/19 5' 6" (1.676 m)    Weight:   Wt Readings from Last 1 Encounters:  06/26/19 61.9 kg    Ideal Body Weight:  64.5 kg  BMI:  Body mass index is 22.03 kg/m.  Estimated Nutritional Needs:   Kcal:  1600-1800  Protein:  70-80g  Fluid:  1.8L/day     Jarome Matin, MS, RD, LDN, CNSC Inpatient Clinical Dietitian Pager # [phone] After hours/weekend pager # [phone]

## 2019-06-26 NOTE — Progress Notes (Signed)
Physical Therapy Treatment Patient Details Name: Clarence Maldonado MRN: [id number] DOB: [birth year] Today's Date: 06/26/2019    History of Present Illness 73 y.o. male with medical history significant of cardiomegaly, stroke, COPD,   CKD stage III, tobacco abuse, DM 2, HTN who presented to the emergency room on 8/10 with progressive weakness over the last 3 months to the point where he was falling over. He has lost approximately 43 pounds in the past 2 months. Pt found to have Pulmonary tuberculosis with cavitation    PT Comments    Pt progressing slowly.  Still present with dyspnea and requires supplemental O2.  SATURATION QUALIFICATIONS: (This note is used to comply with regulatory documentation for home oxygen)  Patient Saturations on Room Air at Rest = 91%  HR 84  Patient Saturations on Room Air while Ambulating = 82%   HR 146  Patient Saturations on 2 Liters of oxygen while Ambulating 20 feet = 92%   HR 132  Please briefly explain why patient needs home oxygen: Pt present with 3/4 DOE and requires supplemental oxygen.    Pt will need ST Rehab at SNF prior to returning to his camper and his dog.  Pt stated he lost his home when his spouse passed and now lives in a camper that is parked at "a friends" house.  Pt present with very limited activity tolerance.  Required a seated rest break after each short distance walk.       Follow Up Recommendations  SNF;Home health PT;Supervision/Assistance - 24 hour     Equipment Recommendations  Rolling walker with 5" wheels    Recommendations for Other Services       Precautions / Restrictions Precautions Precautions: Fall Precaution Comments: monitor O2    Mobility  Bed Mobility Overal bed mobility: Modified Independent             General bed mobility comments: self able  Transfers Overall transfer level: Needs assistance Equipment used: Rolling walker (2 wheeled);None Transfers: Sit to/from Stand Sit to Stand:  Supervision;Min guard Stand pivot transfers: Supervision;Min guard       General transfer comment: for safety. VCs hand placement.  increased unsteady with increased fatigue.  Ambulation/Gait Ambulation/Gait assistance: Min guard;Min assist Gait Distance (Feet): 50 Feet(20, 20, 10 seated rest breaks between) Assistive device: Rolling walker (2 wheeled);None Gait Pattern/deviations: Step-through pattern;Trunk flexed;Shuffle;Staggering left;Staggering right Gait velocity: decreased   General Gait Details: still requires walker for instability and limited distance due to dyspnea   Stairs             Wheelchair Mobility    Modified Rankin (Stroke Patients Only)       Balance                                            Cognition Arousal/Alertness: Awake/alert Behavior During Therapy: WFL for tasks assessed/performed Overall Cognitive Status: Within Functional Limits for tasks assessed                                 General Comments: pleasant and "lonley"      Exercises      General Comments        Pertinent Vitals/Pain Pain Assessment: No/denies pain    Home Living  Prior Function            PT Goals (current goals can now be found in the care plan section) Progress towards PT goals: Progressing toward goals    Frequency    Min 3X/week      PT Plan Current plan remains appropriate    Co-evaluation              AM-PAC PT "6 Clicks" Mobility   Outcome Measure  Help needed turning from your back to your side while in a flat bed without using bedrails?: None Help needed moving from lying on your back to sitting on the side of a flat bed without using bedrails?: None Help needed moving to and from a bed to a chair (including a wheelchair)?: A Little Help needed standing up from a chair using your arms (e.g., wheelchair or bedside chair)?: A Little Help needed to walk in hospital  room?: A Little Help needed climbing 3-5 steps with a railing? : A Lot 6 Click Score: 19    End of Session Equipment Utilized During Treatment: Gait belt Activity Tolerance: Patient limited by fatigue Patient left: in bed   PT Visit Diagnosis: Unsteadiness on feet (R26.81);Muscle weakness (generalized) (M62.81);Adult, failure to thrive (R62.7);History of falling (Z91.81);Repeated falls (R29.6)     Time: 8295-6213 PT Time Calculation (min) (ACUTE ONLY): 32 min  Charges:  $Gait Training: 8-22 mins $Therapeutic Activity: 8-22 mins                     Felecia Shelling  PTA Acute  Rehabilitation Services Pager      [phone] Office      [phone]

## 2019-06-26 NOTE — Progress Notes (Signed)
PROGRESS NOTE    Clarence Maldonado  NLZ:[id number] DOB: 26-Aug-1946 DOA: 05/15/2019 PCP: Clarence Balo, DO   Brief Narrative:  Clarence Maldonado a 73 y.o.male with history of stroke, COPD, CKD stage III, tobacco use, diabetes mellitus type 2, hypertension. Patient presented secondary to weakness and frequent falls, found to have pulmonary TB   Assessment & Plan:   Principal Problem:   Pulmonary tuberculosis with cavitation Active Problems:   Essential hypertension   CKD (chronic kidney disease) stage 3, GFR 30-59 ml/min (HCC)   Hyperlipidemia   Tobacco use disorder   Controlled type 2 diabetes mellitus with stage 3 chronic kidney disease, without long-term current use of insulin (HCC)   Weight loss   Hyperlipidemia associated with type 2 diabetes mellitus (HCC)   Anemia   Moderate protein-calorie malnutrition (HCC)   Rash   Fatigue   Lung infiltrate   Pulmonary TB C/WRIPE therapy per ID.  Last AFB cuture (9/1) still pending, but AFB smear positive. Discussed with ID who are closely following 2/2 public health, recommend weekly AFB smear to ensure clearance and monitor trajectory. Most recentpositive AFB smear was 9/13,3+   Patient continues to make good progress in functional ability. -Continue rifampin, isoniazid, pyrazinamide, ethambutol. -Significant disease burden based on the last sputum culture. -AFB sputum smear sent out on 9/11/2020negative. 06/18/2019 is positive. Per ID we will obtain 2 sputum specimens for AFB smear and culture every 2 weeks. dispo pending ID  Acute kidney injury. On chronic kidney disease stage III Likely from poor p.o. intake. On admission serum creatinine. 1.77.. Currently 1.33  Dysphagia Evaluated by speech therapy. MBS significant for evidence of moderate oropharyngeal dysphagia with sensorimotor deficit and hypomotility of the pharynx. Patient deemed a high aspiration risk and placed on a dysphagia 1 diet. -Initially  patient was on dysphagia diet. Currently on regular diet. This show significant improvement in patient's deconditioning. -Speech therapy consult appreciated.  Peripheral neuropathy Concern this could be related to RIPE therapy. Started on B6.  Symptoms resolved for now.  Left atrial mobile mass Echocardiogram on 05/16/2019 showed a possible left atrial mobile mass. 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 Outpatient cardiology follow-up.  Non-sustained V-tach Resolved. Hypokalemia and magnesium likely contributed. Biweekly labs.  Hypokalemia/Hypomagnesemia Shown again. We will replace.  Iron deficiency anemia -Continue iron supplementation No bleeding reported.  Diabetes mellitus, type 2, controlled without any complication. -Hemoglobin A1c 6.3 on 05/16/2019. Blood sugars are well controlled. Continue SSI  Weight loss Nutrition/PT consulted improvement in the hospital 25+ pound weight gain.  Insomnia. Trazodone at nightly as needed. We will continue it on discharge.  DVT prophylaxis: Lovenox SQ  Code Status: Full    Code Status Orders  (From admission, onward)         Start     Ordered   05/16/19 0038  Full code  Continuous     05/16/19 0038        Code Status History    This patient has a current code status but no historical code status.   Advance Care Planning Activity     Family Communication: none today Disposition Plan: Patient remained inpatient for continued treatment of active TB   Consults called: None Admission status: Inpatient   Consultants:   ID  Procedures:   No results found.  Antimicrobials:   RIPE Tx    Subjective: No changes ovenright Still reports feeling tired  Objective: Vitals:   06/25/19 1225 06/25/19 1818 06/25/19 2001 06/26/19 0557  BP:  140/85 (!) 148/87 (!) 145/87  Pulse: 96 100 98 97  Resp: 18 20 20  (!) 22  Temp:  97.7 F (36.5 C) 98.2 F (36.8 C) 98.2 F (36.8 C)  TempSrc:   Axillary Oral Oral  SpO2: 94% 96% 96% 94%  Weight:    61.9 kg  Height:        Intake/Output Summary (Last 24 hours) at 06/26/2019 1326 Last data filed at 06/26/2019 1109 Gross per 24 hour  Intake 600 ml  Output 800 ml  Net -200 ml   Filed Weights   06/24/19 0657 06/25/19 0456 06/26/19 0557  Weight: 62.2 kg 64.8 kg 61.9 kg    Examination:  General exam: Appears calm and comfortable  Respiratory system: Clear to auscultation. Respiratory effort normal. Cardiovascular system: S1 & S2 heard, RRR. No JVD, murmurs, rubs, gallops or clicks. No pedal edema. Gastrointestinal system: Abdomen is nondistended, soft and nontender. No organomegaly or masses felt. Normal bowel sounds heard. Central nervous system: Alert and oriented. No focal neurological deficits. Extremities: no edema, wwp. Skin: No rashes, lesions or ulcers Psychiatry: Judgement and insight appear normal. Mood & affect appropriate.     Data Reviewed: I have personally reviewed following labs and imaging studies  CBC: Recent Labs  Lab 06/21/19 0450 06/22/19 0359 06/23/19 0345 06/24/19 0327 06/25/19 0336  WBC 7.1 5.9 6.1 6.7 6.0  HGB 11.1* 11.2* 11.4* 11.2* 11.7*  HCT 37.8* 37.2* 37.9* 37.3* 39.1  MCV 86.3 85.7 86.1 85.6 85.7  PLT 235 256 242 245 836   Basic Metabolic Panel: Recent Labs  Lab 06/21/19 0450 06/22/19 0359 06/23/19 0345 06/24/19 0327 06/25/19 0336  NA 134* 136 135 135 134*  K 3.7 3.7 4.0 3.9 4.1  CL 106 105 103 102 100  CO2 21* 21* 23 23 23   GLUCOSE 89 91 92 90 102*  BUN 31* 30* 34* 32* 34*  CREATININE 1.45* 1.43* 1.50* 1.39* 1.33*  CALCIUM 8.6* 8.6* 8.7* 8.7* 9.1  MG 2.0  --   --   --   --    GFR: Estimated Creatinine Clearance: 44 mL/min (A) (by C-G formula based on SCr of 1.33 mg/dL (H)). Liver Function Tests: Recent Labs  Lab 06/21/19 0450  AST 16  ALT 12  ALKPHOS 70  BILITOT 0.5  PROT 6.6  ALBUMIN 2.6*   No results for input(s): LIPASE, AMYLASE in the last 168 hours. No  results for input(s): AMMONIA in the last 168 hours. Coagulation Profile: No results for input(s): INR, PROTIME in the last 168 hours. Cardiac Enzymes: No results for input(s): CKTOTAL, CKMB, CKMBINDEX, TROPONINI in the last 168 hours. BNP (last 3 results) No results for input(s): PROBNP in the last 8760 hours. HbA1C: No results for input(s): HGBA1C in the last 72 hours. CBG: Recent Labs  Lab 06/21/19 0515 06/22/19 0850 06/23/19 0854 06/25/19 0450 06/26/19 0548  GLUCAP 70 131* 98 76 95   Lipid Profile: No results for input(s): CHOL, HDL, LDLCALC, TRIG, CHOLHDL, LDLDIRECT in the last 72 hours. Thyroid Function Tests: No results for input(s): TSH, T4TOTAL, FREET4, T3FREE, THYROIDAB in the last 72 hours. Anemia Panel: No results for input(s): VITAMINB12, FOLATE, FERRITIN, TIBC, IRON, RETICCTPCT in the last 72 hours. Sepsis Labs: No results for input(s): PROCALCITON, LATICACIDVEN in the last 168 hours.  Recent Results (from the past 240 hour(s))  Acid Fast Smear (AFB)     Status: Abnormal   Collection Time: 06/18/19  5:00 AM   Specimen: Sputum  Result Value Ref  Range Status   AFB Specimen Processing Concentration  Final   Acid Fast Smear Positive (A)  Final    Comment: (NOTE) 3+, 4-36 acid-fast bacilli per field at 400X magnification, fluorescent smear POSITIVE SMEAR REPORTED TO Solmon Ice AT 1018 ON 06/19/19 BY AM. Performed At: [facility] [address] [place], [place] [id number] Jolene Schimke MD WE:[id number]    Source (AFB) SPUTUM  Final    Comment: Performed at [facility], [address], [place], [place] [address]         Radiology Studies: No results found.      Scheduled Meds: . buPROPion  150 mg Oral Daily  . clotrimazole   Topical BID  . enoxaparin (LOVENOX) injection  40 mg Subcutaneous Q24H  . ethambutol  1,200 mg Oral Daily  . famotidine  20 mg Oral Daily  . feeding supplement (ENSURE ENLIVE)  237 mL Oral  Q24H  . ferrous sulfate  325 mg Oral BID WC  . ipratropium  2 puff Inhalation TID  . isoniazid  300 mg Oral Daily   And  . vitamin B-6  50 mg Oral Daily  . loratadine  10 mg Oral Q breakfast  . mouth rinse  15 mL Mouth Rinse BID  . mometasone-formoterol  2 puff Inhalation BID  . multivitamin with minerals  1 tablet Oral Daily  . pravastatin  20 mg Oral q1800  . pyrazinamide  1,500 mg Oral Daily  . rifampin  600 mg Oral Daily  . thiamine  100 mg Oral Daily   Continuous Infusions: . sodium chloride Stopped (05/23/19 0250)     LOS: 42 days    Time spent: 35 min    Burke Keels, MD [facility]  If 7PM-7AM, please contact night-coverage  06/26/2019, 1:26 PM

## 2019-06-27 NOTE — Progress Notes (Signed)
PROGRESS NOTE    Clarence Maldonado  UUV:[id number] DOB: Apr 02, 1946 DOA: 05/15/2019 PCP: Gayland Curry, DO   Brief Narrative:  Clarence Maldonado a 73 y.o.male with history of stroke, COPD, CKD stage III, tobacco use, diabetes mellitus type 2, hypertension. Patient presented secondary to weakness and frequent falls, found to have pulmonary TB   Assessment & Plan:   Principal Problem:   Pulmonary tuberculosis with cavitation Active Problems:   Essential hypertension   CKD (chronic kidney disease) stage 3, GFR 30-59 ml/min (HCC)   Hyperlipidemia   Tobacco use disorder   Controlled type 2 diabetes mellitus with stage 3 chronic kidney disease, without long-term current use of insulin (HCC)   Weight loss   Hyperlipidemia associated with type 2 diabetes mellitus (HCC)   Anemia   Moderate protein-calorie malnutrition (HCC)   Rash   Fatigue   Lung infiltrate   Pulmonary TB C/WRIPE therapy per ID.  Last AFB cuture (9/1) still pending, but AFB smear positive. Discussed with ID who are closely following 2/2 public health, recommend weekly AFB smear to ensure clearance and monitor trajectory. Most recentpositive AFB smear was 9/13,3+ Patient continues to make good progress in functional ability. -Continue rifampin, isoniazid, pyrazinamide, ethambutol. -Significant disease burden based on the last sputum culture. -AFB sputum smear sent out on 9/11/2020negative. 06/18/2019 is positive. Per ID we will obtain 2 sputum specimens for AFB smear and culture every 2 weeks. dispo pending ID  Acute kidney injury. On chronic kidney disease stage III Likely from poor p.o. intake. On admission serum creatinine. 1.77.. Currently 1.33  Dysphagia Evaluated by speech therapy. MBS significant for evidence of moderate oropharyngeal dysphagia with sensorimotor deficit and hypomotility of the pharynx. Patient deemed a high aspiration risk and placed on a dysphagia 1 diet. -Initially  patient was on dysphagia diet. Currently on regular diet. This show significant improvement in patient's deconditioning. -Speech therapy consult appreciated.  Peripheral neuropathy Concern this could be related to RIPE therapy. Started on B6.  Symptoms resolved for now.  Left atrial mobile mass Echocardiogram on 05/16/2019 showed a possible left atrial mobile mass. 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 Outpatient cardiology follow-up.  Non-sustained V-tach Resolved. Hypokalemia and magnesium likely contributed. Biweekly labs.  Hypokalemia/Hypomagnesemia Shown again. We will replace.  Iron deficiency anemia -Continue iron supplementation No bleeding reported.  Diabetes mellitus, type 2, controlled without any complication. -Hemoglobin A1c 6.3 on 05/16/2019. Blood sugars are well controlled. Continue SSI  Weight loss Nutrition/PT consulted improvement in the hospital 25+ pound weight gain.  Insomnia. Trazodone at nightly as needed. We will continue it on discharge.  DVT prophylaxis: Lovenox SQ  Code Status: full    Code Status Orders  (From admission, onward)         Start     Ordered   05/16/19 0038  Full code  Continuous     05/16/19 0038        Code Status History    This patient has a current code status but no historical code status.   Advance Care Planning Activity     Family Communication: none today, discussed in detail with patient Disposition Plan:   Patient remains inpatient for continued treatment of active TB.  There is no safe discharge plan given public health concerns per ID Consults called: None Admission status: Inpatient   Consultants:   Infectious disease  Procedures:   No results found.  Antimicrobials:   RIPE Tx    Subjective: No acute changes  overnight Reports feeling short of breath  Objective: Vitals:   06/26/19 1334 06/26/19 2028 06/27/19 0533 06/27/19 0538  BP: 135/71 122/86  136/85   Pulse: (!) 108 68  96  Resp: 16 20  20   Temp: 97.7 F (36.5 C) 98.3 F (36.8 C)  99.2 F (37.3 C)  TempSrc: Oral Oral  Oral  SpO2: 96% 92%  99%  Weight:   60.5 kg   Height:        Intake/Output Summary (Last 24 hours) at 06/27/2019 1420 Last data filed at 06/27/2019 1000 Gross per 24 hour  Intake 240 ml  Output 600 ml  Net -360 ml   Filed Weights   06/25/19 0456 06/26/19 0557 06/27/19 0533  Weight: 64.8 kg 61.9 kg 60.5 kg    Examination:  General exam: Appears calm and mildly short of breath Respiratory system: Clear to auscultation. Respiratory effort normal. Cardiovascular system: S1 & S2 heard, RRR. No JVD, murmurs, rubs, gallops or clicks. No pedal edema. Gastrointestinal system: Abdomen is nondistended, soft and nontender. No organomegaly or masses felt. Normal bowel sounds heard. Central nervous system: Alert and oriented. No focal neurological deficits. Extremities: Warm well perfused, no edema Skin: No rashes, lesions or ulcers Psychiatry: Judgement and insight appear normal. Mood & affect appropriate.     Data Reviewed: I have personally reviewed following labs and imaging studies  CBC: Recent Labs  Lab 06/21/19 0450 06/22/19 0359 06/23/19 0345 06/24/19 0327 06/25/19 0336  WBC 7.1 5.9 6.1 6.7 6.0  HGB 11.1* 11.2* 11.4* 11.2* 11.7*  HCT 37.8* 37.2* 37.9* 37.3* 39.1  MCV 86.3 85.7 86.1 85.6 85.7  PLT 235 256 242 245 230   Basic Metabolic Panel: Recent Labs  Lab 06/21/19 0450 06/22/19 0359 06/23/19 0345 06/24/19 0327 06/25/19 0336  NA 134* 136 135 135 134*  K 3.7 3.7 4.0 3.9 4.1  CL 106 105 103 102 100  CO2 21* 21* 23 23 23   GLUCOSE 89 91 92 90 102*  BUN 31* 30* 34* 32* 34*  CREATININE 1.45* 1.43* 1.50* 1.39* 1.33*  CALCIUM 8.6* 8.6* 8.7* 8.7* 9.1  MG 2.0  --   --   --   --    GFR: Estimated Creatinine Clearance: 43 mL/min (A) (by C-G formula based on SCr of 1.33 mg/dL (H)). Liver Function Tests: Recent Labs  Lab 06/21/19 0450  AST 16   ALT 12  ALKPHOS 70  BILITOT 0.5  PROT 6.6  ALBUMIN 2.6*   No results for input(s): LIPASE, AMYLASE in the last 168 hours. No results for input(s): AMMONIA in the last 168 hours. Coagulation Profile: No results for input(s): INR, PROTIME in the last 168 hours. Cardiac Enzymes: No results for input(s): CKTOTAL, CKMB, CKMBINDEX, TROPONINI in the last 168 hours. BNP (last 3 results) No results for input(s): PROBNP in the last 8760 hours. HbA1C: No results for input(s): HGBA1C in the last 72 hours. CBG: Recent Labs  Lab 06/21/19 0515 06/22/19 0850 06/23/19 0854 06/25/19 0450 06/26/19 0548  GLUCAP 70 131* 98 76 95   Lipid Profile: No results for input(s): CHOL, HDL, LDLCALC, TRIG, CHOLHDL, LDLDIRECT in the last 72 hours. Thyroid Function Tests: No results for input(s): TSH, T4TOTAL, FREET4, T3FREE, THYROIDAB in the last 72 hours. Anemia Panel: No results for input(s): VITAMINB12, FOLATE, FERRITIN, TIBC, IRON, RETICCTPCT in the last 72 hours. Sepsis Labs: No results for input(s): PROCALCITON, LATICACIDVEN in the last 168 hours.  Recent Results (from the past 240 hour(s))  Acid Fast Smear (AFB)  Status: Abnormal   Collection Time: 06/18/19  5:00 AM   Specimen: Sputum  Result Value Ref Range Status   AFB Specimen Processing Concentration  Final   Acid Fast Smear Positive (A)  Final    Comment: (NOTE) 3+, 4-36 acid-fast bacilli per field at 400X magnification, fluorescent smear POSITIVE SMEAR REPORTED TO Solmon Ice AT 1018 ON 06/19/19 BY AM. Performed At: [facility] [address] [place], [place] [id number] Jolene Schimke MD YK:[id number]    Source (AFB) SPUTUM  Final    Comment: Performed at [facility], [address], [place], [place] [address]         Radiology Studies: No results found.      Scheduled Meds: . buPROPion  150 mg Oral Daily  . clotrimazole   Topical BID  . enoxaparin (LOVENOX) injection  40 mg  Subcutaneous Q24H  . famotidine  20 mg Oral Daily  . feeding supplement (ENSURE ENLIVE)  237 mL Oral Q24H  . ferrous sulfate  325 mg Oral BID WC  . ipratropium  2 puff Inhalation TID  . isoniazid  300 mg Oral Daily   And  . vitamin B-6  50 mg Oral Daily  . loratadine  10 mg Oral Q breakfast  . mouth rinse  15 mL Mouth Rinse BID  . mometasone-formoterol  2 puff Inhalation BID  . multivitamin with minerals  1 tablet Oral Daily  . pravastatin  20 mg Oral q1800  . pyrazinamide  1,500 mg Oral Daily  . rifampin  600 mg Oral Daily  . thiamine  100 mg Oral Daily   Continuous Infusions: . sodium chloride Stopped (05/23/19 0250)     LOS: 43 days    Time spent: 35 min    Burke Keels, MD [facility]  If 7PM-7AM, please contact night-coverage  06/27/2019, 2:20 PM

## 2019-06-27 NOTE — TOC Progression Note (Signed)
Transition of Care [facility]) - Progression Note    Patient Details  Name: Clarence Maldonado MRN: [id number] Date of Birth: 1946/03/29  Transition [facility] CM/SW Contact  Purcell Mouton, RN Phone Number: 06/27/2019, 2:08 PM  Clinical Narrative:    ID notes: He is improving slowly.  His last 3 sputum cultures remain negative but are not finalized.  His last sputum AFB smear 06/18/2019 was still 3+ positive.  He will need 2 more sputum specimens submitted for AFB smear and culture at the end of this week.  He is not strong enough to go home under health department supervision and it is still potentially contagious so he cannot be transferred to a skilled nursing facility.  He needs to remain in the hospital on directly observed TB therapy. TOC will continue to follow for discharge to home with [facility] and Health Department. Hoping pt will continue to gain his strength while here, OOB in his room, OOB in a chair. There is no SNF that will take this pt related to his dx of new TB.   Expected Discharge Plan: [place] Barriers to Discharge: Continued Medical Work up  Expected Discharge Plan and Services Expected Discharge Plan: [place]   Discharge Planning Services: CM Consult   Living arrangements for the past 2 months: Mobile Home                                       Social Determinants of Health (SDOH) Interventions    Readmission Risk Interventions Readmission Risk Prevention Plan 05/23/2019  Transportation Screening Complete  PCP or Specialist Appt within 3-5 Days Not Complete  Not Complete comments Not ready for dc  HRI or [place] Complete  Social Work Consult for [place] Planning/Counseling Not Complete  SW consult not completed comments nA  Palliative Care Screening Not Applicable  Medication Review Press photographer) Complete  Some recent data might be hidden

## 2019-06-28 LAB — HEPATIC FUNCTION PANEL
ALT: 11 U/L (ref 0–44)
AST: 19 U/L (ref 15–41)
Albumin: 2.9 g/dL — ABNORMAL LOW (ref 3.5–5.0)
Alkaline Phosphatase: 72 U/L (ref 38–126)
Bilirubin, Direct: 0.1 mg/dL (ref 0.0–0.2)
Indirect Bilirubin: 0.2 mg/dL — ABNORMAL LOW (ref 0.3–0.9)
Total Bilirubin: 0.3 mg/dL (ref 0.3–1.2)
Total Protein: 7.1 g/dL (ref 6.5–8.1)

## 2019-06-28 LAB — GLUCOSE, CAPILLARY: Glucose-Capillary: 90 mg/dL (ref 70–99)

## 2019-06-28 MED ORDER — IPRATROPIUM BROMIDE HFA 17 MCG/ACT IN AERS
2.0000 | INHALATION_SPRAY | Freq: Four times a day (QID) | RESPIRATORY_TRACT | Status: DC | PRN
  Administered 2019-06-29: 2 via RESPIRATORY_TRACT

## 2019-06-28 NOTE — Progress Notes (Signed)
Occupational Therapy Treatment Patient Details Name: Clarence Maldonado MRN: [id number] DOB: 06/20/1946 Today's Date: 06/28/2019    History of present illness 73 y.o. male with medical history significant of cardiomegaly, stroke, COPD,   CKD stage III, tobacco abuse, DM 2, HTN who presented to the emergency room on 8/10 with progressive weakness over the last 3 months to the point where he was falling over. He has lost approximately 43 pounds in the past 2 months. Pt found to have Pulmonary tuberculosis with cavitation   OT comments  Pt progressing towards acute OT goals. Focus of session was household distance functional mobility and simulated toilet transfers. Pt was supervision to min guard with transfers and mobility, assist needed to manage O2 tubing for safety while ambulating with rw. Pt needing seated rest breaks. Pt on 2L O2 throughout session with sats 87-91. D/c plan and OT goals updated.    Follow Up Recommendations  Home health OT;Supervision/Assistance - 24 hour    Equipment Recommendations  3 in 1 bedside commode    Recommendations for Other Services      Precautions / Restrictions Precautions Precautions: Fall Precaution Comments: monitor O2       Mobility Bed Mobility Overal bed mobility: Modified Independent                Transfers Overall transfer level: Needs assistance Equipment used: Rolling walker (2 wheeled);None Transfers: Sit to/from Stand Sit to Stand: Supervision;Min guard         General transfer comment: supervision to min guard for safety. from regular height seat surfaces.    Balance Overall balance assessment: Needs assistance Sitting-balance support: Feet supported Sitting balance-Leahy Scale: Good     Standing balance support: Bilateral upper extremity supported;No upper extremity supported Standing balance-Leahy Scale: Fair Standing balance comment: BUE support for dynamic standing tasks                            ADL either performed or assessed with clinical judgement   ADL Overall ADL's : Needs assistance/impaired                         Toilet Transfer: Min guard;Ambulation;Comfort height toilet;RW Statistician Details (indicate cue type and reason): simulated with walking the distance to/from toilet and sit<>stand transfers.           General ADL Comments: household distance functional mobility with seated rest breaks incorporated. Simulated toilet transfer. Assist to manage O2 tubing, min guard for safety     Vision       Perception     Praxis      Cognition Arousal/Alertness: Awake/alert Behavior During Therapy: WFL for tasks assessed/performed Overall Cognitive Status: No family/caregiver present to determine baseline cognitive functioning Area of Impairment: Memory;Problem solving                     Memory: Decreased short-term memory   Safety/Judgement: Decreased awareness of deficits   Problem Solving: Requires verbal cues General Comments: pleasant. tangential speech. HOH. decreased insight into current deficits. Giving conflicting report about his progress. Initially stating that he had not been into the bathoom this admission and he only has been doing [facility] transfers then reporting that he got up and walked to the bathroom on a previous day.        Exercises     Shoulder Instructions       General Comments Pt  on 2L this session. O2 sats 87-91 throughout session.     Pertinent Vitals/ Pain       Pain Assessment: Faces Faces Pain Scale: Hurts little more Pain Location: back Pain Descriptors / Indicators: Aching Pain Intervention(s): Monitored during session  Home Living                                          Prior Functioning/Environment              Frequency  Min 2X/week        Progress Toward Goals  OT Goals(current goals can now be found in the care plan section)  Progress towards OT goals:  Progressing toward goals  Acute Rehab OT Goals Patient Stated Goal: I want to get stronger so I can go home to my dog.  OT Goal Formulation: With patient Time For Goal Achievement: 07/12/19 Potential to Achieve Goals: Good ADL Goals Pt Will Perform Grooming: standing;with modified independence Pt Will Transfer to Toilet: ambulating;with modified independence Pt Will Perform Toileting - Clothing Manipulation and hygiene: sit to/from stand;with modified independence Additional ADL Goal #1: pt will perform UB adls at set up supervision level and LB ADLs with mod I. Additional ADL Goal #2: discontinue  Plan Frequency needs to be updated;Discharge plan needs to be updated    Co-evaluation                 AM-PAC OT "6 Clicks" Daily Activity     Outcome Measure   Help from another person eating meals?: None Help from another person taking care of personal grooming?: A Little Help from another person toileting, which includes using toliet, bedpan, or urinal?: A Little Help from another person bathing (including washing, rinsing, drying)?: A Little Help from another person to put on and taking off regular upper body clothing?: A Little Help from another person to put on and taking off regular lower body clothing?: A Little 6 Click Score: 19    End of Session Equipment Utilized During Treatment: Rolling walker;Oxygen  OT Visit Diagnosis: Unsteadiness on feet (R26.81)   Activity Tolerance Patient tolerated treatment well   Patient Left in bed;with call bell/phone within reach;with bed alarm set   Nurse Communication          Time: 1330-1400 OT Time Calculation (min): 30 min  Charges: OT General Charges $OT Visit: 1 Visit OT Treatments $Self Care/Home Management : 23-37 mins  Clarence Maldonado, OT Acute Rehabilitation Services Pager: [phone] Office: [phone]    Hortencia Pilar 06/28/2019, 2:15 PM

## 2019-06-28 NOTE — Progress Notes (Signed)
PROGRESS NOTE    Clarence Maldonado  DPT:[id number] DOB: 03/04/1946 DOA: 05/15/2019 PCP: [name], DO    Brief Narrative:  73 y.o.male with history of stroke, COPD, CKD stage III, tobacco use, diabetes mellitus type 2, hypertension. Patient presented secondary to weakness and frequent falls, found to have pulmonary TB  Assessment & Plan:   Principal Problem:   Pulmonary tuberculosis with cavitation Active Problems:   Essential hypertension   CKD (chronic kidney disease) stage 3, GFR 30-59 ml/min (HCC)   Hyperlipidemia   Tobacco use disorder   Controlled type 2 diabetes mellitus with stage 3 chronic kidney disease, without long-term current use of insulin (HCC)   Weight loss   Hyperlipidemia associated with type 2 diabetes mellitus (HCC)   Anemia   Moderate protein-calorie malnutrition (HCC)   Rash   Fatigue   Lung infiltrate   Pulmonary TB C/WRIPE therapy per ID.  Last AFB cuture (9/1) still pending, but AFB smear positive. Discussed with IDwho are closely following 2/2 public health, recommend weekly AFB smear to ensure clearance and monitor trajectory. Most recentpositive AFB smear was 9/13,3+ Patient continues to make good progress in functional ability. -Continue rifampin, isoniazid, pyrazinamide, ethambutol. -Significant disease burden based on the last sputum culture. -AFB sputum smear sent out on 9/11/2020negative. 06/18/2019 is positive. Per ID we will obtain 2 sputum specimens for AFB smear and culture every 2 weeks. Pending disposition  Acute kidney injury. On chronic kidney disease stage III Likely from poor p.o. intake. On admission serum creatinine. 1.77.Marland Kitchen  Dysphagia Evaluated by speech therapy. MBS significant for evidence of moderate oropharyngeal dysphagia with sensorimotor deficit and hypomotility of the pharynx. Patient deemed a high aspiration risk and placed on a dysphagia 1 diet. -Initially patient was on dysphagia diet.  Currently on regular diet. This show significant improvement in patient's deconditioning. -Speech therapy consult appreciated.  Peripheral neuropathy Concern this could be related to RIPE therapy. Started on B6.  Symptoms resolved for now.  Left atrial mobile mass Echocardiogram on 05/16/2019 showed a possible left atrial mobile mass. 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 Outpatient cardiology follow-up.  Non-sustained V-tach Resolved. Hypokalemia and magnesium likely contributed. Biweekly labs.  Hypokalemia/Hypomagnesemia Shown again. We will replace.  Iron deficiency anemia -Continue iron supplementation No bleeding reported.  Diabetes mellitus, type 2, controlled without any complication. -Hemoglobin A1c 6.3 on 05/16/2019. Blood sugars are well controlled. Continue SSI  Weight loss Nutrition/PT consulted improvement in the hospital 25+ pound weight gain.  Insomnia. Trazodone at nightly as needed. We will continue it on discharge.  DVT prophylaxis: Lovenox subq Code Status: Full Family Communication: Pt in room, family not at bedside Disposition Plan: Uncertain at this time  Consultants:   ID  Procedures:     Antimicrobials: Anti-infectives (From admission, onward)   Start     Dose/Rate Route Frequency Ordered Stop   06/03/19 1000  ethambutol (MYAMBUTOL) tablet 1,200 mg  Status:  Discontinued     1,200 mg Oral Daily 06/02/19 1735 06/27/19 1339   06/03/19 1000  isoniazid (NYDRAZID) tablet 300 mg     300 mg Oral Daily 06/02/19 1735     06/03/19 1000  pyrazinamide tablet 1,500 mg     1,500 mg Oral Daily 06/02/19 1735     06/03/19 1000  rifampin (RIFADIN) capsule 600 mg     600 mg Oral Daily 06/02/19 1735     05/24/19 1000  pyrazinamide tablet 1,500 mg  Status:  Discontinued  1,500 mg Oral Daily 05/23/19 1626 06/02/19 1555   05/24/19 1000  ethambutol (MYAMBUTOL) tablet 1,200 mg  Status:  Discontinued     1,200 mg Oral Daily  05/23/19 1626 06/02/19 1555   05/19/19 1600  isoniazid (NYDRAZID) tablet 300 mg  Status:  Discontinued     300 mg Oral Daily 05/19/19 1428 06/02/19 1555   05/19/19 1600  rifampin (RIFADIN) capsule 600 mg  Status:  Discontinued     600 mg Oral Daily 05/19/19 1428 06/02/19 1555   05/19/19 1600  pyrazinamide tablet 1,000 mg  Status:  Discontinued     1,000 mg Oral Daily 05/19/19 1428 05/23/19 1626   05/19/19 1600  ethambutol (MYAMBUTOL) tablet 800 mg  Status:  Discontinued     800 mg Oral Daily 05/19/19 1428 05/23/19 1626   05/18/19 0000  vancomycin (VANCOCIN) IVPB 1000 mg/200 mL premix  Status:  Discontinued     1,000 mg 200 mL/hr over 60 Minutes Intravenous Every 36 hours 05/16/19 1219 05/17/19 1058   05/16/19 1130  vancomycin (VANCOCIN) 1,250 mg in sodium chloride 0.9 % 250 mL IVPB     1,250 mg 166.7 mL/hr over 90 Minutes Intravenous  Once 05/16/19 1100 05/16/19 1404   05/16/19 1000  ampicillin-sulbactam (UNASYN) 1.5 g in sodium chloride 0.9 % 100 mL IVPB     1.5 g 200 mL/hr over 30 Minutes Intravenous Every 6 hours 05/16/19 0952 05/23/19 2359       Subjective: Without complaints  Objective: Vitals:   06/27/19 2117 06/27/19 2154 06/28/19 0527 06/28/19 1603  BP: (!) 148/99  135/67 (!) 157/94  Pulse: (!) 114 (!) 102 90 (!) 122  Resp: 18  18 20   Temp: 98 F (36.7 C)  98 F (36.7 C) (!) 97.5 F (36.4 C)  TempSrc: Oral   Oral  SpO2: 96%  92% 100%  Weight:   60.1 kg   Height:        Intake/Output Summary (Last 24 hours) at 06/28/2019 1911 Last data filed at 06/28/2019 1600 Gross per 24 hour  Intake 840 ml  Output 650 ml  Net 190 ml   Filed Weights   06/26/19 0557 06/27/19 0533 06/28/19 0527  Weight: 61.9 kg 60.5 kg 60.1 kg    Examination:  General exam: Appears calm and comfortable  Respiratory system: Clear to auscultation. Respiratory effort normal.  Data Reviewed: I have personally reviewed following labs and imaging studies  CBC: Recent Labs  Lab 06/22/19  0359 06/23/19 0345 06/24/19 0327 06/25/19 0336  WBC 5.9 6.1 6.7 6.0  HGB 11.2* 11.4* 11.2* 11.7*  HCT 37.2* 37.9* 37.3* 39.1  MCV 85.7 86.1 85.6 85.7  PLT 256 242 245 230   Basic Metabolic Panel: Recent Labs  Lab 06/22/19 0359 06/23/19 0345 06/24/19 0327 06/25/19 0336  NA 136 135 135 134*  K 3.7 4.0 3.9 4.1  CL 105 103 102 100  CO2 21* 23 23 23   GLUCOSE 91 92 90 102*  BUN 30* 34* 32* 34*  CREATININE 1.43* 1.50* 1.39* 1.33*  CALCIUM 8.6* 8.7* 8.7* 9.1   GFR: Estimated Creatinine Clearance: 42.7 mL/min (A) (by C-G formula based on SCr of 1.33 mg/dL (H)). Liver Function Tests: Recent Labs  Lab 06/28/19 0508  AST 19  ALT 11  ALKPHOS 72  BILITOT 0.3  PROT 7.1  ALBUMIN 2.9*   No results for input(s): LIPASE, AMYLASE in the last 168 hours. No results for input(s): AMMONIA in the last 168 hours. Coagulation Profile: No results for input(s):  INR, PROTIME in the last 168 hours. Cardiac Enzymes: No results for input(s): CKTOTAL, CKMB, CKMBINDEX, TROPONINI in the last 168 hours. BNP (last 3 results) No results for input(s): PROBNP in the last 8760 hours. HbA1C: No results for input(s): HGBA1C in the last 72 hours. CBG: Recent Labs  Lab 06/22/19 0850 06/23/19 0854 06/25/19 0450 06/26/19 0548 06/28/19 0523  GLUCAP 131* 98 76 95 90   Lipid Profile: No results for input(s): CHOL, HDL, LDLCALC, TRIG, CHOLHDL, LDLDIRECT in the last 72 hours. Thyroid Function Tests: No results for input(s): TSH, T4TOTAL, FREET4, T3FREE, THYROIDAB in the last 72 hours. Anemia Panel: No results for input(s): VITAMINB12, FOLATE, FERRITIN, TIBC, IRON, RETICCTPCT in the last 72 hours. Sepsis Labs: No results for input(s): PROCALCITON, LATICACIDVEN in the last 168 hours.  No results found for this or any previous visit (from the past 240 hour(s)).   Radiology Studies: No results found.  Scheduled Meds: . buPROPion  150 mg Oral Daily  . clotrimazole   Topical BID  . enoxaparin  (LOVENOX) injection  40 mg Subcutaneous Q24H  . famotidine  20 mg Oral Daily  . feeding supplement (ENSURE ENLIVE)  237 mL Oral Q24H  . ferrous sulfate  325 mg Oral BID WC  . ipratropium  2 puff Inhalation TID  . isoniazid  300 mg Oral Daily   And  . vitamin B-6  50 mg Oral Daily  . loratadine  10 mg Oral Q breakfast  . mouth rinse  15 mL Mouth Rinse BID  . mometasone-formoterol  2 puff Inhalation BID  . multivitamin with minerals  1 tablet Oral Daily  . pravastatin  20 mg Oral q1800  . pyrazinamide  1,500 mg Oral Daily  . rifampin  600 mg Oral Daily  . thiamine  100 mg Oral Daily   Continuous Infusions: . sodium chloride Stopped (05/23/19 0250)     LOS: 29 days   Marylu Lund, MD [facility] Pager On Amion  If 7PM-7AM, please contact night-coverage 06/28/2019, 7:11 PM

## 2019-06-28 NOTE — Progress Notes (Signed)
Pt encouraged to get OOB to recliner multiple times by RN during AM assessment. Pt refused, stating he only would "if you can get me a more comfortable recliner." Pt asked if he was going home- RN stated to patient that he has to have strength to care for himself to go home alone. RN advised pt to get OOB moving often. Pt did not want to at this time. Pt stated he wishes he could go home tomorrow, but stated he is "living a life of luxury with all these pretty women waiting on me."
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
## 2019-06-29 LAB — AFB ORGANISM ID BY DNA PROBE
M avium complex: NEGATIVE
M tuberculosis complex: POSITIVE — AB

## 2019-06-29 LAB — ACID FAST CULTURE WITH REFLEXED SENSITIVITIES (MYCOBACTERIA): Acid Fast Culture: POSITIVE — AB

## 2019-06-29 NOTE — Progress Notes (Signed)
PROGRESS NOTE    Clarence Maldonado  ZOX:[id number] DOB: 1945/10/26 DOA: 05/15/2019 PCP: [name], DO    Brief Narrative:  73 y.o.male with history of stroke, COPD, CKD stage III, tobacco use, diabetes mellitus type 2, hypertension. Patient presented secondary to weakness and frequent falls, found to have pulmonary TB  Assessment & Plan:   Principal Problem:   Pulmonary tuberculosis with cavitation Active Problems:   Essential hypertension   CKD (chronic kidney disease) stage 3, GFR 30-59 ml/min (HCC)   Hyperlipidemia   Tobacco use disorder   Controlled type 2 diabetes mellitus with stage 3 chronic kidney disease, without long-term current use of insulin (HCC)   Weight loss   Hyperlipidemia associated with type 2 diabetes mellitus (HCC)   Anemia   Moderate protein-calorie malnutrition (HCC)   Rash   Fatigue   Lung infiltrate   Pulmonary TB C/WRIPE therapy per ID.  Last AFB cuture (9/1) still pending, but AFB smear positive. Discussed with IDwho are closely following 2/2 public health, recommend weekly AFB smear to ensure clearance and monitor trajectory. Most recentpositive AFB smear was 9/13,3+ Patient continues to make good progress in functional ability. -Continue rifampin, isoniazid, pyrazinamide, ethambutol. -Significant disease burden based on the last sputum culture. -AFB sputum smear sent out on 9/11/2020negative. 06/18/2019 is positive. Per ID we will obtain 2 sputum specimens for AFB smear and culture every 2 weeks. Still awaiting disposition  Acute kidney injury. On chronic kidney disease stage III Likely from poor p.o. intake. On admission serum creatinine. 1.77.Marland Kitchen  Dysphagia Evaluated by speech therapy. MBS significant for evidence of moderate oropharyngeal dysphagia with sensorimotor deficit and hypomotility of the pharynx. Patient deemed a high aspiration risk and placed on a dysphagia 1 diet. -Initially patient was on dysphagia diet.  Currently on regular diet. This show significant improvement in patient's deconditioning. -Speech therapy consult appreciated.  Peripheral neuropathy Concern this could be related to RIPE therapy. Started on B6.  Symptoms resolved for now.  Left atrial mobile mass Echocardiogram on 05/16/2019 showed a possible left atrial mobile mass. 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 Outpatient cardiology follow-up.  Non-sustained V-tach Resolved. Hypokalemia and magnesium likely contributed. Biweekly labs.  Hypokalemia/Hypomagnesemia Shown again. We will replace.  Iron deficiency anemia -Continue iron supplementation No bleeding reported.  Diabetes mellitus, type 2, controlled without any complication. -Hemoglobin A1c 6.3 on 05/16/2019. Blood sugars are well controlled. Continue SSI  Weight loss Nutrition/PT consulted improvement in the hospital 25+ pound weight gain.  Insomnia. Trazodone at nightly as needed. We will continue it on discharge.  DVT prophylaxis: Lovenox subq Code Status: Full Family Communication: Pt in room, family not at bedside Disposition Plan: Uncertain at this time  Consultants:   ID  Procedures:     Antimicrobials: Anti-infectives (From admission, onward)   Start     Dose/Rate Route Frequency Ordered Stop   06/03/19 1000  ethambutol (MYAMBUTOL) tablet 1,200 mg  Status:  Discontinued     1,200 mg Oral Daily 06/02/19 1735 06/27/19 1339   06/03/19 1000  isoniazid (NYDRAZID) tablet 300 mg     300 mg Oral Daily 06/02/19 1735     06/03/19 1000  pyrazinamide tablet 1,500 mg     1,500 mg Oral Daily 06/02/19 1735     06/03/19 1000  rifampin (RIFADIN) capsule 600 mg     600 mg Oral Daily 06/02/19 1735     05/24/19 1000  pyrazinamide tablet 1,500 mg  Status:  Discontinued  1,500 mg Oral Daily 05/23/19 1626 06/02/19 1555   05/24/19 1000  ethambutol (MYAMBUTOL) tablet 1,200 mg  Status:  Discontinued     1,200 mg Oral Daily  05/23/19 1626 06/02/19 1555   05/19/19 1600  isoniazid (NYDRAZID) tablet 300 mg  Status:  Discontinued     300 mg Oral Daily 05/19/19 1428 06/02/19 1555   05/19/19 1600  rifampin (RIFADIN) capsule 600 mg  Status:  Discontinued     600 mg Oral Daily 05/19/19 1428 06/02/19 1555   05/19/19 1600  pyrazinamide tablet 1,000 mg  Status:  Discontinued     1,000 mg Oral Daily 05/19/19 1428 05/23/19 1626   05/19/19 1600  ethambutol (MYAMBUTOL) tablet 800 mg  Status:  Discontinued     800 mg Oral Daily 05/19/19 1428 05/23/19 1626   05/18/19 0000  vancomycin (VANCOCIN) IVPB 1000 mg/200 mL premix  Status:  Discontinued     1,000 mg 200 mL/hr over 60 Minutes Intravenous Every 36 hours 05/16/19 1219 05/17/19 1058   05/16/19 1130  vancomycin (VANCOCIN) 1,250 mg in sodium chloride 0.9 % 250 mL IVPB     1,250 mg 166.7 mL/hr over 90 Minutes Intravenous  Once 05/16/19 1100 05/16/19 1404   05/16/19 1000  ampicillin-sulbactam (UNASYN) 1.5 g in sodium chloride 0.9 % 100 mL IVPB     1.5 g 200 mL/hr over 30 Minutes Intravenous Every 6 hours 05/16/19 0952 05/23/19 2359      Subjective: No complaints  Objective: Vitals:   06/29/19 0032 06/29/19 0617 06/29/19 0831 06/29/19 1311  BP: 137/84 134/70  (!) 160/95  Pulse: 100 92  (!) 104  Resp: 18 18  20   Temp: 98 F (36.7 C) 98.3 F (36.8 C)  98.5 F (36.9 C)  TempSrc: Oral   Oral  SpO2: 99% 96% 97% 96%  Weight:  60 kg    Height:        Intake/Output Summary (Last 24 hours) at 06/29/2019 1909 Last data filed at 06/29/2019 0636 Gross per 24 hour  Intake -  Output 1000 ml  Net -1000 ml   Filed Weights   06/27/19 0533 06/28/19 0527 06/29/19 0617  Weight: 60.5 kg 60.1 kg 60 kg    Examination: General exam: Awake, laying in bed, in nad Respiratory system: Normal respiratory effort, no wheezing  Data Reviewed: I have personally reviewed following labs and imaging studies  CBC: Recent Labs  Lab 06/23/19 0345 06/24/19 0327 06/25/19 0336  WBC 6.1  6.7 6.0  HGB 11.4* 11.2* 11.7*  HCT 37.9* 37.3* 39.1  MCV 86.1 85.6 85.7  PLT 242 245 230   Basic Metabolic Panel: Recent Labs  Lab 06/23/19 0345 06/24/19 0327 06/25/19 0336  NA 135 135 134*  K 4.0 3.9 4.1  CL 103 102 100  CO2 23 23 23   GLUCOSE 92 90 102*  BUN 34* 32* 34*  CREATININE 1.50* 1.39* 1.33*  CALCIUM 8.7* 8.7* 9.1   GFR: Estimated Creatinine Clearance: 42.6 mL/min (A) (by C-G formula based on SCr of 1.33 mg/dL (H)). Liver Function Tests: Recent Labs  Lab 06/28/19 0508  AST 19  ALT 11  ALKPHOS 72  BILITOT 0.3  PROT 7.1  ALBUMIN 2.9*   No results for input(s): LIPASE, AMYLASE in the last 168 hours. No results for input(s): AMMONIA in the last 168 hours. Coagulation Profile: No results for input(s): INR, PROTIME in the last 168 hours. Cardiac Enzymes: No results for input(s): CKTOTAL, CKMB, CKMBINDEX, TROPONINI in the last 168 hours. BNP (last  3 results) No results for input(s): PROBNP in the last 8760 hours. HbA1C: No results for input(s): HGBA1C in the last 72 hours. CBG: Recent Labs  Lab 06/23/19 0854 06/25/19 0450 06/26/19 0548 06/28/19 0523  GLUCAP 98 76 95 90   Lipid Profile: No results for input(s): CHOL, HDL, LDLCALC, TRIG, CHOLHDL, LDLDIRECT in the last 72 hours. Thyroid Function Tests: No results for input(s): TSH, T4TOTAL, FREET4, T3FREE, THYROIDAB in the last 72 hours. Anemia Panel: No results for input(s): VITAMINB12, FOLATE, FERRITIN, TIBC, IRON, RETICCTPCT in the last 72 hours. Sepsis Labs: No results for input(s): PROCALCITON, LATICACIDVEN in the last 168 hours.  No results found for this or any previous visit (from the past 240 hour(s)).   Radiology Studies: No results found.  Scheduled Meds: . buPROPion  150 mg Oral Daily  . clotrimazole   Topical BID  . enoxaparin (LOVENOX) injection  40 mg Subcutaneous Q24H  . famotidine  20 mg Oral Daily  . feeding supplement (ENSURE ENLIVE)  237 mL Oral Q24H  . ferrous sulfate  325  mg Oral BID WC  . isoniazid  300 mg Oral Daily   And  . vitamin B-6  50 mg Oral Daily  . loratadine  10 mg Oral Q breakfast  . mouth rinse  15 mL Mouth Rinse BID  . mometasone-formoterol  2 puff Inhalation BID  . multivitamin with minerals  1 tablet Oral Daily  . pravastatin  20 mg Oral q1800  . pyrazinamide  1,500 mg Oral Daily  . rifampin  600 mg Oral Daily  . thiamine  100 mg Oral Daily   Continuous Infusions: . sodium chloride Stopped (05/23/19 0250)     LOS: 2 days   Marylu Lund, MD [facility] Pager On Amion  If 7PM-7AM, please contact night-coverage 06/29/2019, 7:09 PM

## 2019-06-30 LAB — GLUCOSE, CAPILLARY
Glucose-Capillary: 113 mg/dL — ABNORMAL HIGH (ref 70–99)
Glucose-Capillary: 171 mg/dL — ABNORMAL HIGH (ref 70–99)

## 2019-06-30 NOTE — Progress Notes (Signed)
Physical Therapy Treatment Patient Details Name: Clarence Maldonado MRN: [id number] DOB: 03/07/1946 Today's Date: 06/30/2019    History of Present Illness 73 y.o. male with medical history significant of cardiomegaly, stroke, COPD,   CKD stage III, tobacco abuse, DM 2, HTN who presented to the emergency room on 8/10 with progressive weakness over the last 3 months to the point where he was falling over. He has lost approximately 43 pounds in the past 2 months. Pt found to have Pulmonary tuberculosis with cavitation    PT Comments    Pt showing progress.  Trial RA with amb lowest was 85% vs 82% last trial.  Pt stated "I feel better". Also tolerated increased duration.  Still not performing as prior level but improving.  Pt would some sort of Rehab to regain prior level of Indep.    SATURATION QUALIFICATIONS: (This note is used to comply with regulatory documentation for home oxygen)  Patient Saturations on Room Air at Rest = 94%  Patient Saturations on Room Air while Ambulating = 85%  Patient Saturations on 1 Liters of oxygen while Ambulating = 90%  Please briefly explain why patient needs home oxygen: only required 1 lt   Follow Up Recommendations  SNF;Home health PT;Supervision/Assistance - 24 hour     Equipment Recommendations  Rolling walker with 5" wheels    Recommendations for Other Services       Precautions / Restrictions Precautions Precautions: Fall Precaution Comments: monitor O2 Restrictions Weight Bearing Restrictions: No    Mobility  Bed Mobility Overal bed mobility: Modified Independent             General bed mobility comments: self able  Transfers Overall transfer level: Needs assistance Equipment used: Rolling walker (2 wheeled)   Sit to Stand: Supervision Stand pivot transfers: Supervision       General transfer comment: safety  Ambulation/Gait Ambulation/Gait assistance: Supervision Gait Distance (Feet): 45 Feet(one seated rest  break) Assistive device: Rolling walker (2 wheeled) Gait Pattern/deviations: Step-through pattern;Decreased stride length Gait velocity: decreased   General Gait Details: Trial amb RA showing slow progress sats 85% with HR 132 "I feel better".   Stairs             Wheelchair Mobility    Modified Rankin (Stroke Patients Only)       Balance                                            Cognition Arousal/Alertness: Awake/alert Behavior During Therapy: WFL for tasks assessed/performed Overall Cognitive Status: Within Functional Limits for tasks assessed                                        Exercises      General Comments        Pertinent Vitals/Pain      Home Living                      Prior Function            PT Goals (current goals can now be found in the care plan section) Progress towards PT goals: Progressing toward goals    Frequency    Min 3X/week      PT Plan Current plan remains appropriate  Co-evaluation              AM-PAC PT "6 Clicks" Mobility   Outcome Measure  Help needed turning from your back to your side while in a flat bed without using bedrails?: None Help needed moving from lying on your back to sitting on the side of a flat bed without using bedrails?: None Help needed moving to and from a bed to a chair (including a wheelchair)?: A Little Help needed standing up from a chair using your arms (e.g., wheelchair or bedside chair)?: A Little Help needed to walk in hospital room?: A Little Help needed climbing 3-5 steps with a railing? : A Little 6 Click Score: 20    End of Session Equipment Utilized During Treatment: Gait belt Activity Tolerance: Patient tolerated treatment well Patient left: in bed;with call bell/phone within reach;with bed alarm set   PT Visit Diagnosis: Unsteadiness on feet (R26.81);Muscle weakness (generalized) (M62.81);Adult, failure to thrive  (R62.7);History of falling (Z91.81)     Time: 6433-2951 PT Time Calculation (min) (ACUTE ONLY): 28 min  Charges:  $Gait Training: 8-22 mins $Therapeutic Activity: 8-22 mins                     Rica Koyanagi  PTA Acute  Rehabilitation Services Pager      [phone] Office      [phone]

## 2019-06-30 NOTE — Progress Notes (Signed)
PROGRESS NOTE    Clarence Maldonado  HBZ:[id number] DOB: 1946-01-26 DOA: 05/15/2019 PCP: [name], DO    Brief Narrative:  73 y.o.male with history of stroke, COPD, CKD stage III, tobacco use, diabetes mellitus type 2, hypertension. Patient presented secondary to weakness and frequent falls, found to have pulmonary TB  Assessment & Plan:   Principal Problem:   Pulmonary tuberculosis with cavitation Active Problems:   Essential hypertension   CKD (chronic kidney disease) stage 3, GFR 30-59 ml/min (HCC)   Hyperlipidemia   Tobacco use disorder   Controlled type 2 diabetes mellitus with stage 3 chronic kidney disease, without long-term current use of insulin (HCC)   Weight loss   Hyperlipidemia associated with type 2 diabetes mellitus (HCC)   Anemia   Moderate protein-calorie malnutrition (HCC)   Rash   Fatigue   Lung infiltrate   Pulmonary TB C/WRIPE therapy per ID.  Last AFB cuture (9/1) still pending, but AFB smear positive. Discussed with IDwho are closely following 2/2 public health, recommend weekly AFB smear to ensure clearance and monitor trajectory. Most recentpositive AFB smear was 9/13,3+ Patient continues to make good progress in functional ability. -Continue rifampin, isoniazid, pyrazinamide, ethambutol. -Significant disease burden based on the last sputum culture. -AFB sputum smear sent out on 9/11/2020negative. 06/18/2019 is positive. Per ID we will obtain 2 sputum specimens for AFB smear and culture every 2 weeks. Pending disposition  Acute kidney injury. On chronic kidney disease stage III Likely from poor p.o. intake. On admission serum creatinine noted to be 1.77.Marland Kitchen  Dysphagia Evaluated by speech therapy. MBS significant for evidence of moderate oropharyngeal dysphagia with sensorimotor deficit and hypomotility of the pharynx. Patient deemed a high aspiration risk and placed on a dysphagia 1 diet. -Initially patient was on dysphagia  diet. Currently on regular diet. This show significant improvement in patient's deconditioning. -Speech therapy consult appreciated.  Peripheral neuropathy Concern this could be related to RIPE therapy. Started on B6.  Symptoms resolved for now.  Left atrial mobile mass Echocardiogram on 05/16/2019 showed a possible left atrial mobile mass. 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 Outpatient cardiology follow-up.  Non-sustained V-tach Resolved. Hypokalemia and magnesium likely contributed. Biweekly labs.  Hypokalemia/Hypomagnesemia Shown again. We will replace.  Iron deficiency anemia -Continue iron supplementation No bleeding reported.  Diabetes mellitus, type 2, controlled without any complication. -Hemoglobin A1c 6.3 on 05/16/2019. Blood sugars are well controlled. Continue SSI  Weight loss Nutrition/PT consulted improvement in the hospital 25+ pound weight gain.  Insomnia. Trazodone at nightly as needed. We will continue it on discharge.  DVT prophylaxis: Lovenox subq Code Status: Full Family Communication: Pt in room, family not at bedside Disposition Plan: Uncertain at this time  Consultants:   ID  Procedures:     Antimicrobials: Anti-infectives (From admission, onward)   Start     Dose/Rate Route Frequency Ordered Stop   06/03/19 1000  ethambutol (MYAMBUTOL) tablet 1,200 mg  Status:  Discontinued     1,200 mg Oral Daily 06/02/19 1735 06/27/19 1339   06/03/19 1000  isoniazid (NYDRAZID) tablet 300 mg     300 mg Oral Daily 06/02/19 1735     06/03/19 1000  pyrazinamide tablet 1,500 mg     1,500 mg Oral Daily 06/02/19 1735     06/03/19 1000  rifampin (RIFADIN) capsule 600 mg     600 mg Oral Daily 06/02/19 1735     05/24/19 1000  pyrazinamide tablet 1,500 mg  Status:  Discontinued  1,500 mg Oral Daily 05/23/19 1626 06/02/19 1555   05/24/19 1000  ethambutol (MYAMBUTOL) tablet 1,200 mg  Status:  Discontinued     1,200 mg Oral  Daily 05/23/19 1626 06/02/19 1555   05/19/19 1600  isoniazid (NYDRAZID) tablet 300 mg  Status:  Discontinued     300 mg Oral Daily 05/19/19 1428 06/02/19 1555   05/19/19 1600  rifampin (RIFADIN) capsule 600 mg  Status:  Discontinued     600 mg Oral Daily 05/19/19 1428 06/02/19 1555   05/19/19 1600  pyrazinamide tablet 1,000 mg  Status:  Discontinued     1,000 mg Oral Daily 05/19/19 1428 05/23/19 1626   05/19/19 1600  ethambutol (MYAMBUTOL) tablet 800 mg  Status:  Discontinued     800 mg Oral Daily 05/19/19 1428 05/23/19 1626   05/18/19 0000  vancomycin (VANCOCIN) IVPB 1000 mg/200 mL premix  Status:  Discontinued     1,000 mg 200 mL/hr over 60 Minutes Intravenous Every 36 hours 05/16/19 1219 05/17/19 1058   05/16/19 1130  vancomycin (VANCOCIN) 1,250 mg in sodium chloride 0.9 % 250 mL IVPB     1,250 mg 166.7 mL/hr over 90 Minutes Intravenous  Once 05/16/19 1100 05/16/19 1404   05/16/19 1000  ampicillin-sulbactam (UNASYN) 1.5 g in sodium chloride 0.9 % 100 mL IVPB     1.5 g 200 mL/hr over 30 Minutes Intravenous Every 6 hours 05/16/19 0952 05/23/19 2359      Subjective: Slept better overnight  Objective: Vitals:   06/29/19 2214 06/30/19 0714 06/30/19 0834 06/30/19 1251  BP: 139/75 135/86  120/73  Pulse: (!) 107 91  (!) 104  Resp: 16 16  16   Temp: 99.1 F (37.3 C) 97.8 F (36.6 C)  97.7 F (36.5 C)  TempSrc: Oral Oral  Oral  SpO2: 97% 95% 95% 96%  Weight:  61.8 kg    Height:        Intake/Output Summary (Last 24 hours) at 06/30/2019 1737 Last data filed at 06/30/2019 1300 Gross per 24 hour  Intake 580 ml  Output 1450 ml  Net -870 ml   Filed Weights   06/28/19 0527 06/29/19 0617 06/30/19 0714  Weight: 60.1 kg 60 kg 61.8 kg    Examination: General exam: Conversant, in no acute distress Respiratory system: normal chest rise, clear, no audible wheezing  Data Reviewed: I have personally reviewed following labs and imaging studies  CBC: Recent Labs  Lab 06/24/19 0327  06/25/19 0336  WBC 6.7 6.0  HGB 11.2* 11.7*  HCT 37.3* 39.1  MCV 85.6 85.7  PLT 245 230   Basic Metabolic Panel: Recent Labs  Lab 06/24/19 0327 06/25/19 0336  NA 135 134*  K 3.9 4.1  CL 102 100  CO2 23 23  GLUCOSE 90 102*  BUN 32* 34*  CREATININE 1.39* 1.33*  CALCIUM 8.7* 9.1   GFR: Estimated Creatinine Clearance: 43.9 mL/min (A) (by C-G formula based on SCr of 1.33 mg/dL (H)). Liver Function Tests: Recent Labs  Lab 06/28/19 0508  AST 19  ALT 11  ALKPHOS 72  BILITOT 0.3  PROT 7.1  ALBUMIN 2.9*   No results for input(s): LIPASE, AMYLASE in the last 168 hours. No results for input(s): AMMONIA in the last 168 hours. Coagulation Profile: No results for input(s): INR, PROTIME in the last 168 hours. Cardiac Enzymes: No results for input(s): CKTOTAL, CKMB, CKMBINDEX, TROPONINI in the last 168 hours. BNP (last 3 results) No results for input(s): PROBNP in the last 8760 hours. HbA1C: No  results for input(s): HGBA1C in the last 72 hours. CBG: Recent Labs  Lab 06/25/19 0450 06/26/19 0548 06/28/19 0523 06/30/19 0900 06/30/19 1559  GLUCAP 76 95 90 113* 171*   Lipid Profile: No results for input(s): CHOL, HDL, LDLCALC, TRIG, CHOLHDL, LDLDIRECT in the last 72 hours. Thyroid Function Tests: No results for input(s): TSH, T4TOTAL, FREET4, T3FREE, THYROIDAB in the last 72 hours. Anemia Panel: No results for input(s): VITAMINB12, FOLATE, FERRITIN, TIBC, IRON, RETICCTPCT in the last 72 hours. Sepsis Labs: No results for input(s): PROCALCITON, LATICACIDVEN in the last 168 hours.  No results found for this or any previous visit (from the past 240 hour(s)).   Radiology Studies: No results found.  Scheduled Meds: . buPROPion  150 mg Oral Daily  . clotrimazole   Topical BID  . enoxaparin (LOVENOX) injection  40 mg Subcutaneous Q24H  . famotidine  20 mg Oral Daily  . feeding supplement (ENSURE ENLIVE)  237 mL Oral Q24H  . ferrous sulfate  325 mg Oral BID WC  .  isoniazid  300 mg Oral Daily   And  . vitamin B-6  50 mg Oral Daily  . loratadine  10 mg Oral Q breakfast  . mouth rinse  15 mL Mouth Rinse BID  . mometasone-formoterol  2 puff Inhalation BID  . multivitamin with minerals  1 tablet Oral Daily  . pravastatin  20 mg Oral q1800  . pyrazinamide  1,500 mg Oral Daily  . rifampin  600 mg Oral Daily  . thiamine  100 mg Oral Daily   Continuous Infusions: . sodium chloride Stopped (05/23/19 0250)     LOS: 110 days   Marylu Lund, MD [facility] Pager On Amion  If 7PM-7AM, please contact night-coverage 06/30/2019, 5:37 PM

## 2019-07-01 ENCOUNTER — Inpatient Hospital Stay (HOSPITAL_COMMUNITY): Payer: Medicare Other

## 2019-07-01 LAB — GLUCOSE, CAPILLARY: Glucose-Capillary: 141 mg/dL — ABNORMAL HIGH (ref 70–99)

## 2019-07-01 NOTE — Plan of Care (Signed)
  Problem: Health Behavior/Discharge Planning: Goal: Ability to manage health-related needs will improve Outcome: Progressing   Problem: Activity: Goal: Risk for activity intolerance will decrease Outcome: Progressing   

## 2019-07-01 NOTE — Progress Notes (Signed)
PROGRESS NOTE    Clarence Maldonado  RCV:[id number] DOB: 1946-10-01 DOA: 05/15/2019 PCP: [name], DO    Brief Narrative:  73 y.o.male with history of stroke, COPD, CKD stage III, tobacco use, diabetes mellitus type 2, hypertension. Patient presented secondary to weakness and frequent falls, found to have pulmonary TB  Assessment & Plan:   Principal Problem:   Pulmonary tuberculosis with cavitation Active Problems:   Essential hypertension   CKD (chronic kidney disease) stage 3, GFR 30-59 ml/min (HCC)   Hyperlipidemia   Tobacco use disorder   Controlled type 2 diabetes mellitus with stage 3 chronic kidney disease, without long-term current use of insulin (HCC)   Weight loss   Hyperlipidemia associated with type 2 diabetes mellitus (HCC)   Anemia   Moderate protein-calorie malnutrition (HCC)   Rash   Fatigue   Lung infiltrate   Pulmonary TB C/WRIPE therapy per ID.  Last AFB cuture (9/1) still pending, but AFB smear positive. Discussed with IDwho are closely following 2/2 public health, recommend weekly AFB smear to ensure clearance and monitor trajectory. Most recentpositive AFB smear was 9/13,3+ Patient continues to make good progress in functional ability. -Continue rifampin, isoniazid, pyrazinamide, ethambutol. -Significant disease burden based on the last sputum culture. -AFB sputum smear sent out on 9/11/2020negative. 06/18/2019 is positive. Per ID we will obtain 2 sputum specimens for AFB smear and culture every 2 weeks. -Repeat CXR reviewed, demonstrating persistent infiltrates  Acute kidney injury. On chronic kidney disease stage III Likely from poor p.o. intake. On admission serum creatinine noted to be 1.77.Marland Kitchen  Dysphagia Evaluated by speech therapy. MBS significant for evidence of moderate oropharyngeal dysphagia with sensorimotor deficit and hypomotility of the pharynx. Patient deemed a high aspiration risk and placed on a dysphagia 1 diet.  -Initially patient was on dysphagia diet. Currently on regular diet. This show significant improvement in patient's deconditioning. -Speech therapy consult appreciated.  Peripheral neuropathy Concern this could be related to RIPE therapy. Started on B6.  Symptoms resolved for now.  Left atrial mobile mass Echocardiogram on 05/16/2019 showed a possible left atrial mobile mass. 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 Outpatient cardiology follow-up.  Non-sustained V-tach Resolved. Hypokalemia and magnesium likely contributed. Biweekly labs.  Hypokalemia/Hypomagnesemia Shown again. We will replace.  Iron deficiency anemia -Continue iron supplementation No bleeding reported.  Diabetes mellitus, type 2, controlled without any complication. -Hemoglobin A1c 6.3 on 05/16/2019. Blood sugars are well controlled. Continue SSI  Weight loss Nutrition/PT consulted improvement in the hospital 25+ pound weight gain.  Insomnia. Trazodone at nightly as needed. We will continue it on discharge.  DVT prophylaxis: Lovenox subq Code Status: Full Family Communication: Pt in room, family not at bedside Disposition Plan: Uncertain at this time  Consultants:   ID  Procedures:     Antimicrobials: Anti-infectives (From admission, onward)   Start     Dose/Rate Route Frequency Ordered Stop   06/03/19 1000  ethambutol (MYAMBUTOL) tablet 1,200 mg  Status:  Discontinued     1,200 mg Oral Daily 06/02/19 1735 06/27/19 1339   06/03/19 1000  isoniazid (NYDRAZID) tablet 300 mg     300 mg Oral Daily 06/02/19 1735     06/03/19 1000  pyrazinamide tablet 1,500 mg     1,500 mg Oral Daily 06/02/19 1735     06/03/19 1000  rifampin (RIFADIN) capsule 600 mg     600 mg Oral Daily 06/02/19 1735     05/24/19 1000  pyrazinamide tablet 1,500 mg  Status:  Discontinued     1,500 mg Oral Daily 05/23/19 1626 06/02/19 1555   05/24/19 1000  ethambutol (MYAMBUTOL) tablet 1,200 mg   Status:  Discontinued     1,200 mg Oral Daily 05/23/19 1626 06/02/19 1555   05/19/19 1600  isoniazid (NYDRAZID) tablet 300 mg  Status:  Discontinued     300 mg Oral Daily 05/19/19 1428 06/02/19 1555   05/19/19 1600  rifampin (RIFADIN) capsule 600 mg  Status:  Discontinued     600 mg Oral Daily 05/19/19 1428 06/02/19 1555   05/19/19 1600  pyrazinamide tablet 1,000 mg  Status:  Discontinued     1,000 mg Oral Daily 05/19/19 1428 05/23/19 1626   05/19/19 1600  ethambutol (MYAMBUTOL) tablet 800 mg  Status:  Discontinued     800 mg Oral Daily 05/19/19 1428 05/23/19 1626   05/18/19 0000  vancomycin (VANCOCIN) IVPB 1000 mg/200 mL premix  Status:  Discontinued     1,000 mg 200 mL/hr over 60 Minutes Intravenous Every 36 hours 05/16/19 1219 05/17/19 1058   05/16/19 1130  vancomycin (VANCOCIN) 1,250 mg in sodium chloride 0.9 % 250 mL IVPB     1,250 mg 166.7 mL/hr over 90 Minutes Intravenous  Once 05/16/19 1100 05/16/19 1404   05/16/19 1000  ampicillin-sulbactam (UNASYN) 1.5 g in sodium chloride 0.9 % 100 mL IVPB     1.5 g 200 mL/hr over 30 Minutes Intravenous Every 6 hours 05/16/19 0952 05/23/19 2359      Subjective: Reports feeling better, in good spirits  Objective: Vitals:   06/30/19 2051 07/01/19 0654 07/01/19 0811 07/01/19 1340  BP: (!) 142/91 139/74  138/82  Pulse: (!) 110 85  (!) 102  Resp: 14 14  18   Temp: 97.9 F (36.6 C) 98.9 F (37.2 C)  98.1 F (36.7 C)  TempSrc: Oral Oral  Oral  SpO2: 99% 95% 97% 95%  Weight:  62 kg    Height:        Intake/Output Summary (Last 24 hours) at 07/01/2019 1519 Last data filed at 07/01/2019 0900 Gross per 24 hour  Intake 360 ml  Output 1025 ml  Net -665 ml   Filed Weights   06/29/19 0617 06/30/19 0714 07/01/19 0654  Weight: 60 kg 61.8 kg 62 kg    Examination: General exam: Awake, laying in bed, in nad Respiratory system: Normal respiratory effort, no wheezing Cardiovascular system: regular rate, s1, s2  Data Reviewed: I have  personally reviewed following labs and imaging studies  CBC: Recent Labs  Lab 06/25/19 0336  WBC 6.0  HGB 11.7*  HCT 39.1  MCV 85.7  PLT 230   Basic Metabolic Panel: Recent Labs  Lab 06/25/19 0336  NA 134*  K 4.1  CL 100  CO2 23  GLUCOSE 102*  BUN 34*  CREATININE 1.33*  CALCIUM 9.1   GFR: Estimated Creatinine Clearance: 44 mL/min (A) (by C-G formula based on SCr of 1.33 mg/dL (H)). Liver Function Tests: Recent Labs  Lab 06/28/19 0508  AST 19  ALT 11  ALKPHOS 72  BILITOT 0.3  PROT 7.1  ALBUMIN 2.9*   No results for input(s): LIPASE, AMYLASE in the last 168 hours. No results for input(s): AMMONIA in the last 168 hours. Coagulation Profile: No results for input(s): INR, PROTIME in the last 168 hours. Cardiac Enzymes: No results for input(s): CKTOTAL, CKMB, CKMBINDEX, TROPONINI in the last 168 hours. BNP (last 3 results) No results for input(s): PROBNP in the last 8760 hours. HbA1C: No results  for input(s): HGBA1C in the last 72 hours. CBG: Recent Labs  Lab 06/26/19 0548 06/28/19 0523 06/30/19 0900 06/30/19 1559 07/01/19 0851  GLUCAP 95 90 113* 171* 141*   Lipid Profile: No results for input(s): CHOL, HDL, LDLCALC, TRIG, CHOLHDL, LDLDIRECT in the last 72 hours. Thyroid Function Tests: No results for input(s): TSH, T4TOTAL, FREET4, T3FREE, THYROIDAB in the last 72 hours. Anemia Panel: No results for input(s): VITAMINB12, FOLATE, FERRITIN, TIBC, IRON, RETICCTPCT in the last 72 hours. Sepsis Labs: No results for input(s): PROCALCITON, LATICACIDVEN in the last 168 hours.  No results found for this or any previous visit (from the past 240 hour(s)).   Radiology Studies: Dg Chest Port 1 View  Result Date: 07/01/2019 CLINICAL DATA:  Shortness of breath. EXAM: PORTABLE CHEST 1 VIEW COMPARISON:  Radiographs of May 19, 2019. FINDINGS: Stable cardiomediastinal silhouette. No pneumothorax or pleural effusion is noted. Continued multifocal airspace opacities  are noted in the left lung consistent with pneumonia which may be slightly improved compared to prior exam. Minimal right basilar subsegmental atelectasis is noted. Small right upper lobe airspace opacity is noted which may represent early pneumonia. Bony thorax is unremarkable. IMPRESSION: Continued multifocal airspace opacities are noted in left lung which is slightly improved compared to prior exam, consistent with multifocal pneumonia. Mildly increased right upper lobe opacity is noted concerning for developing pneumonia. Aortic Atherosclerosis (ICD10-I70.0). Electronically Signed   By: Marijo Conception M.D.   On: 07/01/2019 14:10    Scheduled Meds: . buPROPion  150 mg Oral Daily  . clotrimazole   Topical BID  . enoxaparin (LOVENOX) injection  40 mg Subcutaneous Q24H  . famotidine  20 mg Oral Daily  . feeding supplement (ENSURE ENLIVE)  237 mL Oral Q24H  . ferrous sulfate  325 mg Oral BID WC  . isoniazid  300 mg Oral Daily   And  . vitamin B-6  50 mg Oral Daily  . loratadine  10 mg Oral Q breakfast  . mouth rinse  15 mL Mouth Rinse BID  . mometasone-formoterol  2 puff Inhalation BID  . multivitamin with minerals  1 tablet Oral Daily  . pravastatin  20 mg Oral q1800  . pyrazinamide  1,500 mg Oral Daily  . rifampin  600 mg Oral Daily  . thiamine  100 mg Oral Daily   Continuous Infusions: . sodium chloride Stopped (05/23/19 0250)     LOS: 72 days   Marylu Lund, MD [facility] Pager On Amion  If 7PM-7AM, please contact night-coverage 07/01/2019, 3:19 PM

## 2019-07-02 LAB — GLUCOSE, CAPILLARY: Glucose-Capillary: 100 mg/dL — ABNORMAL HIGH (ref 70–99)

## 2019-07-02 NOTE — Progress Notes (Signed)
PROGRESS NOTE    Clarence Maldonado  ZSW:[id number] DOB: Jun 16, 1946 DOA: 05/15/2019 PCP: Gayland Curry, DO    Brief Narrative:  73 y.o.male with history of stroke, COPD, CKD stage III, tobacco use, diabetes mellitus type 2, hypertension. Patient presented secondary to weakness and frequent falls, found to have pulmonary TB  Assessment & Plan:   Principal Problem:   Pulmonary tuberculosis with cavitation Active Problems:   Essential hypertension   CKD (chronic kidney disease) stage 3, GFR 30-59 ml/min (HCC)   Hyperlipidemia   Tobacco use disorder   Controlled type 2 diabetes mellitus with stage 3 chronic kidney disease, without long-term current use of insulin (HCC)   Weight loss   Hyperlipidemia associated with type 2 diabetes mellitus (HCC)   Anemia   Moderate protein-calorie malnutrition (HCC)   Rash   Fatigue   Lung infiltrate   Pulmonary TB C/WRIPE therapy per ID.  Last AFB cuture (9/1) still pending, but AFB smear positive. Discussed with IDwho are closely following 2/2 public health, recommend weekly AFB smear to ensure clearance and monitor trajectory. Most recentpositive AFB smear was 9/13, AFB culture pending Patient continues to make good progress in functional ability. -Continue rifampin, isoniazid, pyrazinamide -Significant disease burden based on the last sputum culture. -AFB sputum smear sent out on 9/11/2020negative. 06/18/2019 is positive. Per ID we will obtain 2 sputum specimens for AFB smear and culture every 2 weeks. -Repeat CXR reviewed, persistent infiltrates noted, pending disposition  Acute kidney injury. On chronic kidney disease stage III Likely from poor p.o. intake. On admission serum creatinine noted to be 1.77.Marland Kitchen  Dysphagia Evaluated by speech therapy. MBS significant for evidence of moderate oropharyngeal dysphagia with sensorimotor deficit and hypomotility of the pharynx. Patient deemed a high aspiration risk and placed on a  dysphagia 1 diet. -Initially patient was on dysphagia diet. Currently on regular diet. This show significant improvement in patient's deconditioning. -Speech therapy consult appreciated.  Peripheral neuropathy Concern this could be related to RIPE therapy. Started on B6.  Symptoms resolved for now.  Left atrial mobile mass Echocardiogram on 05/16/2019 showed a possible left atrial mobile mass. 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 Outpatient cardiology follow-up.  Non-sustained V-tach Resolved. Hypokalemia and magnesium likely contributed. Biweekly labs.  Hypokalemia/Hypomagnesemia Shown again. We will replace.  Iron deficiency anemia -Continue iron supplementation No bleeding reported.  Diabetes mellitus, type 2, controlled without any complication. -Hemoglobin A1c 6.3 on 05/16/2019. Blood sugars are well controlled. Continue SSI  Weight loss Nutrition/PT consulted improvement in the hospital 25+ pound weight gain.  Insomnia. Trazodone at nightly as needed. We will continue it on discharge.  DVT prophylaxis: Lovenox subq Code Status: Full Family Communication: Pt in room, family not at bedside Disposition Plan: Uncertain at this time  Consultants:   ID  Procedures:     Antimicrobials: Anti-infectives (From admission, onward)   Start     Dose/Rate Route Frequency Ordered Stop   06/03/19 1000  ethambutol (MYAMBUTOL) tablet 1,200 mg  Status:  Discontinued     1,200 mg Oral Daily 06/02/19 1735 06/27/19 1339   06/03/19 1000  isoniazid (NYDRAZID) tablet 300 mg     300 mg Oral Daily 06/02/19 1735     06/03/19 1000  pyrazinamide tablet 1,500 mg     1,500 mg Oral Daily 06/02/19 1735     06/03/19 1000  rifampin (RIFADIN) capsule 600 mg     600 mg Oral Daily 06/02/19 1735     05/24/19 1000  pyrazinamide  tablet 1,500 mg  Status:  Discontinued     1,500 mg Oral Daily 05/23/19 1626 06/02/19 1555   05/24/19 1000  ethambutol (MYAMBUTOL)  tablet 1,200 mg  Status:  Discontinued     1,200 mg Oral Daily 05/23/19 1626 06/02/19 1555   05/19/19 1600  isoniazid (NYDRAZID) tablet 300 mg  Status:  Discontinued     300 mg Oral Daily 05/19/19 1428 06/02/19 1555   05/19/19 1600  rifampin (RIFADIN) capsule 600 mg  Status:  Discontinued     600 mg Oral Daily 05/19/19 1428 06/02/19 1555   05/19/19 1600  pyrazinamide tablet 1,000 mg  Status:  Discontinued     1,000 mg Oral Daily 05/19/19 1428 05/23/19 1626   05/19/19 1600  ethambutol (MYAMBUTOL) tablet 800 mg  Status:  Discontinued     800 mg Oral Daily 05/19/19 1428 05/23/19 1626   05/18/19 0000  vancomycin (VANCOCIN) IVPB 1000 mg/200 mL premix  Status:  Discontinued     1,000 mg 200 mL/hr over 60 Minutes Intravenous Every 36 hours 05/16/19 1219 05/17/19 1058   05/16/19 1130  vancomycin (VANCOCIN) 1,250 mg in sodium chloride 0.9 % 250 mL IVPB     1,250 mg 166.7 mL/hr over 90 Minutes Intravenous  Once 05/16/19 1100 05/16/19 1404   05/16/19 1000  ampicillin-sulbactam (UNASYN) 1.5 g in sodium chloride 0.9 % 100 mL IVPB     1.5 g 200 mL/hr over 30 Minutes Intravenous Every 6 hours 05/16/19 0952 05/23/19 2359      Subjective: Reports feeling stronger  Objective: Vitals:   07/01/19 2045 07/02/19 0532 07/02/19 0819 07/02/19 1413  BP: 140/74 124/80  129/83  Pulse: 99 (!) 106  (!) 110  Resp: 18 18  18   Temp: 98.7 F (37.1 C) 98.9 F (37.2 C)  98 F (36.7 C)  TempSrc: Oral Oral  Oral  SpO2: 96% 92% 94% 95%  Weight:      Height:        Intake/Output Summary (Last 24 hours) at 07/02/2019 1543 Last data filed at 07/02/2019 1200 Gross per 24 hour  Intake 30 ml  Output 975 ml  Net -945 ml   Filed Weights   06/29/19 0617 06/30/19 0714 07/01/19 0654  Weight: 60 kg 61.8 kg 62 kg    Examination: General exam: Conversant, in no acute distress Respiratory system: normal chest rise, clear, no audible wheezing Cardiovascular system: regular rhythm, s1-s2  Data Reviewed: I have  personally reviewed following labs and imaging studies  CBC: No results for input(s): WBC, NEUTROABS, HGB, HCT, MCV, PLT in the last 168 hours. Basic Metabolic Panel: No results for input(s): NA, K, CL, CO2, GLUCOSE, BUN, CREATININE, CALCIUM, MG, PHOS in the last 168 hours. GFR: Estimated Creatinine Clearance: 44 mL/min (A) (by C-G formula based on SCr of 1.33 mg/dL (H)). Liver Function Tests: Recent Labs  Lab 06/28/19 0508  AST 19  ALT 11  ALKPHOS 72  BILITOT 0.3  PROT 7.1  ALBUMIN 2.9*   No results for input(s): LIPASE, AMYLASE in the last 168 hours. No results for input(s): AMMONIA in the last 168 hours. Coagulation Profile: No results for input(s): INR, PROTIME in the last 168 hours. Cardiac Enzymes: No results for input(s): CKTOTAL, CKMB, CKMBINDEX, TROPONINI in the last 168 hours. BNP (last 3 results) No results for input(s): PROBNP in the last 8760 hours. HbA1C: No results for input(s): HGBA1C in the last 72 hours. CBG: Recent Labs  Lab 06/28/19 0523 06/30/19 0900 06/30/19 1559 07/01/19 0851 07/02/19  0945  GLUCAP 90 113* 171* 141* 100*   Lipid Profile: No results for input(s): CHOL, HDL, LDLCALC, TRIG, CHOLHDL, LDLDIRECT in the last 72 hours. Thyroid Function Tests: No results for input(s): TSH, T4TOTAL, FREET4, T3FREE, THYROIDAB in the last 72 hours. Anemia Panel: No results for input(s): VITAMINB12, FOLATE, FERRITIN, TIBC, IRON, RETICCTPCT in the last 72 hours. Sepsis Labs: No results for input(s): PROCALCITON, LATICACIDVEN in the last 168 hours.  No results found for this or any previous visit (from the past 240 hour(s)).   Radiology Studies: Dg Chest Port 1 View  Result Date: 07/01/2019 CLINICAL DATA:  Shortness of breath. EXAM: PORTABLE CHEST 1 VIEW COMPARISON:  Radiographs of May 19, 2019. FINDINGS: Stable cardiomediastinal silhouette. No pneumothorax or pleural effusion is noted. Continued multifocal airspace opacities are noted in the left lung  consistent with pneumonia which may be slightly improved compared to prior exam. Minimal right basilar subsegmental atelectasis is noted. Small right upper lobe airspace opacity is noted which may represent early pneumonia. Bony thorax is unremarkable. IMPRESSION: Continued multifocal airspace opacities are noted in left lung which is slightly improved compared to prior exam, consistent with multifocal pneumonia. Mildly increased right upper lobe opacity is noted concerning for developing pneumonia. Aortic Atherosclerosis (ICD10-I70.0). Electronically Signed   By: Lupita Raider M.D.   On: 07/01/2019 14:10    Scheduled Meds: . buPROPion  150 mg Oral Daily  . clotrimazole   Topical BID  . enoxaparin (LOVENOX) injection  40 mg Subcutaneous Q24H  . famotidine  20 mg Oral Daily  . feeding supplement (ENSURE ENLIVE)  237 mL Oral Q24H  . ferrous sulfate  325 mg Oral BID WC  . isoniazid  300 mg Oral Daily   And  . vitamin B-6  50 mg Oral Daily  . loratadine  10 mg Oral Q breakfast  . mouth rinse  15 mL Mouth Rinse BID  . mometasone-formoterol  2 puff Inhalation BID  . multivitamin with minerals  1 tablet Oral Daily  . pravastatin  20 mg Oral q1800  . pyrazinamide  1,500 mg Oral Daily  . rifampin  600 mg Oral Daily  . thiamine  100 mg Oral Daily   Continuous Infusions: . sodium chloride Stopped (05/23/19 0250)     LOS: 48 days   Rickey Barbara, MD [facility] Pager On Amion  If 7PM-7AM, please contact night-coverage 07/02/2019, 3:43 PM

## 2019-07-03 LAB — BASIC METABOLIC PANEL
Anion gap: 12 (ref 5–15)
BUN: 42 mg/dL — ABNORMAL HIGH (ref 8–23)
CO2: 22 mmol/L (ref 22–32)
Calcium: 9.5 mg/dL (ref 8.9–10.3)
Chloride: 100 mmol/L (ref 98–111)
Creatinine, Ser: 1.35 mg/dL — ABNORMAL HIGH (ref 0.61–1.24)
GFR calc Af Amer: >60 mL/min (ref >60)
GFR calc non Af Amer: 52 mL/min — ABNORMAL LOW (ref >60)
Glucose, Bld: 110 mg/dL — ABNORMAL HIGH (ref 70–99)
Potassium: 4.2 mmol/L (ref 3.5–5.1)
Sodium: 134 mmol/L — ABNORMAL LOW (ref 135–145)

## 2019-07-03 LAB — CBC
HCT: 38.5 % — ABNORMAL LOW (ref 39.0–52.0)
Hemoglobin: 11.8 g/dL — ABNORMAL LOW (ref 13.0–17.0)
MCH: 26.3 pg (ref 26.0–34.0)
MCHC: 30.6 g/dL (ref 30.0–36.0)
MCV: 85.9 fL (ref 80.0–100.0)
Platelets: 247 10*3/uL (ref 150–400)
RBC: 4.48 MIL/uL (ref 4.22–5.81)
RDW: 22.2 % — ABNORMAL HIGH (ref 11.5–15.5)
WBC: 7.4 10*3/uL (ref 4.0–10.5)
nRBC: 0 % (ref 0.0–0.2)

## 2019-07-03 LAB — GLUCOSE, CAPILLARY: Glucose-Capillary: 102 mg/dL — ABNORMAL HIGH (ref 70–99)

## 2019-07-03 NOTE — Progress Notes (Signed)
PROGRESS NOTE    Clarence Maldonado  GUR:[id number] DOB: [birth year] DOA: 05/15/2019 PCP: [name], DO    Brief Narrative:  73 y.o.male with history of stroke, COPD, CKD stage III, tobacco use, diabetes mellitus type 2, hypertension. Patient presented secondary to weakness and frequent falls, found to have pulmonary TB  Assessment & Plan:   Principal Problem:   Pulmonary tuberculosis with cavitation Active Problems:   Essential hypertension   CKD (chronic kidney disease) stage 3, GFR 30-59 ml/min (HCC)   Hyperlipidemia   Tobacco use disorder   Controlled type 2 diabetes mellitus with stage 3 chronic kidney disease, without long-term current use of insulin (HCC)   Weight loss   Hyperlipidemia associated with type 2 diabetes mellitus (HCC)   Anemia   Moderate protein-calorie malnutrition (HCC)   Rash   Fatigue   Lung infiltrate   Pulmonary TB C/WRIPE therapy per ID.  Last AFB cuture (9/1) still pending, but AFB smear positive. Discussed with IDwho are closely following 2/2 public health, recommend weekly AFB smear to ensure clearance and monitor trajectory. Most recentpositive AFB smear was 9/13, AFB culture pending Patient continues to make good progress in functional ability. -Continue rifampin, isoniazid, pyrazinamide -Significant disease burden based on the last sputum culture. -AFB sputum smear sent out on 9/11/2020negative. 06/18/2019 is positive. Per ID we will obtain 2 sputum specimens for AFB smear and culture every 2 weeks. -Repeat CXR reviewed, persistent infiltrates noted -Awaiting disposition  Acute kidney injury. On chronic kidney disease stage III Likely from poor p.o. intake. On admission serum creatinine noted to be 1.77 -Cr today 1.35  Dysphagia Evaluated by speech therapy. MBS significant for evidence of moderate oropharyngeal dysphagia with sensorimotor deficit and hypomotility of the pharynx. Patient deemed a high aspiration risk and  placed on a dysphagia 1 diet. -Initially patient was on dysphagia diet. Currently on regular diet. This show significant improvement in patient's deconditioning. -Speech therapy consult appreciated.  Peripheral neuropathy Concern this could be related to RIPE therapy. Started on B6.  Symptoms resolved for now.  Left atrial mobile mass Echocardiogram on 05/16/2019 showed a possible left atrial mobile mass. 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 Outpatient cardiology follow-up.  Non-sustained V-tach Resolved. Hypokalemia and magnesium likely contributed. Biweekly labs.  Hypokalemia/Hypomagnesemia Shown again. We will replace.  Iron deficiency anemia -Continue iron supplementation No bleeding reported.  Diabetes mellitus, type 2, controlled without any complication. -Hemoglobin A1c 6.3 on 05/16/2019. Blood sugars are well controlled. Continue SSI  Weight loss Nutrition/PT consulted improvement in the hospital 25+ pound weight gain.  Insomnia. Trazodone at nightly as needed. We will continue it on discharge.  DVT prophylaxis: Lovenox subq Code Status: Full Family Communication: Pt in room, family not at bedside Disposition Plan: Uncertain at this time  Consultants:   ID  Procedures:     Antimicrobials: Anti-infectives (From admission, onward)   Start     Dose/Rate Route Frequency Ordered Stop   06/03/19 1000  ethambutol (MYAMBUTOL) tablet 1,200 mg  Status:  Discontinued     1,200 mg Oral Daily 06/02/19 1735 06/27/19 1339   06/03/19 1000  isoniazid (NYDRAZID) tablet 300 mg     300 mg Oral Daily 06/02/19 1735     06/03/19 1000  pyrazinamide tablet 1,500 mg     1,500 mg Oral Daily 06/02/19 1735     06/03/19 1000  rifampin (RIFADIN) capsule 600 mg     600 mg Oral Daily 06/02/19 1735     05/24/19  1000  pyrazinamide tablet 1,500 mg  Status:  Discontinued     1,500 mg Oral Daily 05/23/19 1626 06/02/19 1555   05/24/19 1000  ethambutol  (MYAMBUTOL) tablet 1,200 mg  Status:  Discontinued     1,200 mg Oral Daily 05/23/19 1626 06/02/19 1555   05/19/19 1600  isoniazid (NYDRAZID) tablet 300 mg  Status:  Discontinued     300 mg Oral Daily 05/19/19 1428 06/02/19 1555   05/19/19 1600  rifampin (RIFADIN) capsule 600 mg  Status:  Discontinued     600 mg Oral Daily 05/19/19 1428 06/02/19 1555   05/19/19 1600  pyrazinamide tablet 1,000 mg  Status:  Discontinued     1,000 mg Oral Daily 05/19/19 1428 05/23/19 1626   05/19/19 1600  ethambutol (MYAMBUTOL) tablet 800 mg  Status:  Discontinued     800 mg Oral Daily 05/19/19 1428 05/23/19 1626   05/18/19 0000  vancomycin (VANCOCIN) IVPB 1000 mg/200 mL premix  Status:  Discontinued     1,000 mg 200 mL/hr over 60 Minutes Intravenous Every 36 hours 05/16/19 1219 05/17/19 1058   05/16/19 1130  vancomycin (VANCOCIN) 1,250 mg in sodium chloride 0.9 % 250 mL IVPB     1,250 mg 166.7 mL/hr over 90 Minutes Intravenous  Once 05/16/19 1100 05/16/19 1404   05/16/19 1000  ampicillin-sulbactam (UNASYN) 1.5 g in sodium chloride 0.9 % 100 mL IVPB     1.5 g 200 mL/hr over 30 Minutes Intravenous Every 6 hours 05/16/19 0952 05/23/19 2359      Subjective: Without complaints this AM  Objective: Vitals:   07/02/19 2140 07/03/19 0536 07/03/19 0700 07/03/19 1400  BP:  140/75  137/69  Pulse:  62  (!) 102  Resp:  18  17  Temp:  97.9 F (36.6 C)  98.2 F (36.8 C)  TempSrc:  Oral  Oral  SpO2: 96% 94%  96%  Weight:   63.5 kg   Height:        Intake/Output Summary (Last 24 hours) at 07/03/2019 1828 Last data filed at 07/03/2019 1130 Gross per 24 hour  Intake -  Output 975 ml  Net -975 ml   Filed Weights   06/30/19 0714 07/01/19 0654 07/03/19 0700  Weight: 61.8 kg 62 kg 63.5 kg    Examination: General exam: Awake, laying in bed, in nad Respiratory system: Normal respiratory effort, no wheezing  Data Reviewed: I have personally reviewed following labs and imaging studies  CBC: Recent Labs   Lab 07/03/19 0408  WBC 7.4  HGB 11.8*  HCT 38.5*  MCV 85.9  PLT 626   Basic Metabolic Panel: Recent Labs  Lab 07/03/19 0408  NA 134*  K 4.2  CL 100  CO2 22  GLUCOSE 110*  BUN 42*  CREATININE 1.35*  CALCIUM 9.5   GFR: Estimated Creatinine Clearance: 44.4 mL/min (A) (by C-G formula based on SCr of 1.35 mg/dL (H)). Liver Function Tests: Recent Labs  Lab 06/28/19 0508  AST 19  ALT 11  ALKPHOS 72  BILITOT 0.3  PROT 7.1  ALBUMIN 2.9*   No results for input(s): LIPASE, AMYLASE in the last 168 hours. No results for input(s): AMMONIA in the last 168 hours. Coagulation Profile: No results for input(s): INR, PROTIME in the last 168 hours. Cardiac Enzymes: No results for input(s): CKTOTAL, CKMB, CKMBINDEX, TROPONINI in the last 168 hours. BNP (last 3 results) No results for input(s): PROBNP in the last 8760 hours. HbA1C: No results for input(s): HGBA1C in the last 72  hours. CBG: Recent Labs  Lab 06/30/19 0900 06/30/19 1559 07/01/19 0851 07/02/19 0945 07/03/19 0532  GLUCAP 113* 171* 141* 100* 102*   Lipid Profile: No results for input(s): CHOL, HDL, LDLCALC, TRIG, CHOLHDL, LDLDIRECT in the last 72 hours. Thyroid Function Tests: No results for input(s): TSH, T4TOTAL, FREET4, T3FREE, THYROIDAB in the last 72 hours. Anemia Panel: No results for input(s): VITAMINB12, FOLATE, FERRITIN, TIBC, IRON, RETICCTPCT in the last 72 hours. Sepsis Labs: No results for input(s): PROCALCITON, LATICACIDVEN in the last 168 hours.  No results found for this or any previous visit (from the past 240 hour(s)).   Radiology Studies: No results found.  Scheduled Meds: . buPROPion  150 mg Oral Daily  . clotrimazole   Topical BID  . enoxaparin (LOVENOX) injection  40 mg Subcutaneous Q24H  . famotidine  20 mg Oral Daily  . feeding supplement (ENSURE ENLIVE)  237 mL Oral Q24H  . ferrous sulfate  325 mg Oral BID WC  . isoniazid  300 mg Oral Daily   And  . vitamin B-6  50 mg Oral  Daily  . loratadine  10 mg Oral Q breakfast  . mouth rinse  15 mL Mouth Rinse BID  . mometasone-formoterol  2 puff Inhalation BID  . multivitamin with minerals  1 tablet Oral Daily  . pravastatin  20 mg Oral q1800  . pyrazinamide  1,500 mg Oral Daily  . rifampin  600 mg Oral Daily  . thiamine  100 mg Oral Daily   Continuous Infusions: . sodium chloride Stopped (05/23/19 0250)     LOS: 49 days   Rickey Barbara, MD [facility] Pager On Amion  If 7PM-7AM, please contact night-coverage 07/03/2019, 6:28 PM

## 2019-07-03 NOTE — Progress Notes (Addendum)
Physical Therapy Treatment Patient Details Name: CLAUDIE RATHBONE MRN: [id number] DOB: 1946/07/03 Today's Date: 07/03/2019    History of Present Illness 73 y.o. male with medical history significant of cardiomegaly, stroke, COPD,   CKD stage III, tobacco abuse, DM 2, HTN who presented to the emergency room on 8/10 with progressive weakness over the last 3 months to the point where he was falling over. He has lost approximately 43 pounds in the past 2 months. Pt found to have Pulmonary tuberculosis with cavitation    PT Comments    Pt ambulated within room and maintained 2L O2 [place].  Pt reports breathing improved however SPO2 87-98% on 2L during session. HR also fluctuating with dynamap reading however appeared 118-126 bpm with activity if reading accurately (other times was reading in 30s-40s however poor waveform).  Pt also performed a few standing and seated exercises as tolerated.   Follow Up Recommendations  SNF;Home health PT;Supervision/Assistance - 24 hour(SNF vs Home with 24/7 assist)     Equipment Recommendations  Rolling walker with 5" wheels    Recommendations for Other Services       Precautions / Restrictions Precautions Precautions: Fall Precaution Comments: monitor O2    Mobility  Bed Mobility Overal bed mobility: Modified Independent             General bed mobility comments: pt left sitting EOB upon departure, bed alarm activated  Transfers Overall transfer level: Needs assistance Equipment used: Rolling walker (2 wheeled) Transfers: Sit to/from Stand Sit to Stand: Supervision         General transfer comment: pt requires UE assist to rise  Ambulation/Gait Ambulation/Gait assistance: Supervision Gait Distance (Feet): 48 Feet Assistive device: Rolling walker (2 wheeled) Gait Pattern/deviations: Step-through pattern;Decreased stride length Gait velocity: decreased   General Gait Details: verbal cues for posture, 48'x1 and then seated rest break,  SPO2 87% on 2L O2 however then increased to 98% with breathing cues; pt able to perform another 24'x1 however then fatigued   Stairs             Wheelchair Mobility    Modified Rankin (Stroke Patients Only)       Balance                                            Cognition Arousal/Alertness: Awake/alert Behavior During Therapy: WFL for tasks assessed/performed Overall Cognitive Status: Within Functional Limits for tasks assessed                                 General Comments: pt very pleasant and cooperative      Exercises General Exercises - Lower Extremity Ankle Circles/Pumps: AROM;Both;10 reps;Seated Long Arc Quad: AROM;Both;Seated;10 reps Straight Leg Raises: AROM;Both;Standing;10 reps Hip Flexion/Marching: AROM;Both;10 reps;Seated;Standing    General Comments        Pertinent Vitals/Pain Pain Assessment: No/denies pain Faces Pain Scale: Hurts little more Pain Location: back Pain Descriptors / Indicators: Aching;Sore Pain Intervention(s): Monitored during session    Home Living                      Prior Function            PT Goals (current goals can now be found in the care plan section) Acute Rehab PT Goals PT Goal Formulation:  With patient Time For Goal Achievement: 07/17/19 Potential to Achieve Goals: Good Progress towards PT goals: Progressing toward goals    Frequency    Min 3X/week      PT Plan Current plan remains appropriate    Co-evaluation              AM-PAC PT "6 Clicks" Mobility   Outcome Measure  Help needed turning from your back to your side while in a flat bed without using bedrails?: None Help needed moving from lying on your back to sitting on the side of a flat bed without using bedrails?: None Help needed moving to and from a bed to a chair (including a wheelchair)?: A Little Help needed standing up from a chair using your arms (e.g., wheelchair or bedside  chair)?: A Little Help needed to walk in hospital room?: A Little Help needed climbing 3-5 steps with a railing? : A Little 6 Click Score: 20    End of Session Equipment Utilized During Treatment: Gait belt;Oxygen Activity Tolerance: Patient tolerated treatment well Patient left: in bed;with call bell/phone within reach;with bed alarm set   PT Visit Diagnosis: Muscle weakness (generalized) (M62.81);Difficulty in walking, not elsewhere classified (R26.2)     Time: 7673-4193 PT Time Calculation (min) (ACUTE ONLY): 40 min  Charges:  $Gait Training: 23-37 mins                     Zenovia Jarred, PT, DPT Acute Rehabilitation Services Office: [phone] Pager: [phone]   Sarajane Jews 07/03/2019, 3:55 PM

## 2019-07-04 LAB — GLUCOSE, CAPILLARY: Glucose-Capillary: 123 mg/dL — ABNORMAL HIGH (ref 70–99)

## 2019-07-04 NOTE — Progress Notes (Signed)
Patient ID: Clarence Maldonado, male   DOB: 1946/04/02, 73 y.o.   MRN: [id number]         [facility] for Infectious Disease  Date of Admission:  05/15/2019           Day 47 TB therapy  ASSESSMENT: He continues to show slow improvement on therapy for severe TB pneumonia.  His repeat sputum AFB culture was again positive for TB on 06/06/2019.  Cultures from 06/16/2019 and 06/18/2019 are pending.  Repeat sputum specimens were sent yesterday.  Those AFB smears are pending.  We still need further evidence of culture negativity before he could be transferred to a skilled nursing facility and he remains too weak to live independently at home.  He will need to remain hospitalized for now.  PLAN: 1. Continue isoniazid, rifampin and ethambutol 2. Await results of repeat sputum AFB smears and cultures 3. We will follow weekly with you  Principal Problem:   Pulmonary tuberculosis with cavitation Active Problems:   Essential hypertension   CKD (chronic kidney disease) stage 3, GFR 30-59 ml/min (HCC)   Hyperlipidemia   Tobacco use disorder   Controlled type 2 diabetes mellitus with stage 3 chronic kidney disease, without long-term current use of insulin (HCC)   Weight loss   Hyperlipidemia associated with type 2 diabetes mellitus (HCC)   Anemia   Moderate protein-calorie malnutrition (HCC)   Scheduled Meds: . buPROPion  150 mg Oral Daily  . clotrimazole   Topical BID  . enoxaparin (LOVENOX) injection  40 mg Subcutaneous Q24H  . famotidine  20 mg Oral Daily  . feeding supplement (ENSURE ENLIVE)  237 mL Oral Q24H  . ferrous sulfate  325 mg Oral BID WC  . isoniazid  300 mg Oral Daily   And  . vitamin B-6  50 mg Oral Daily  . loratadine  10 mg Oral Q breakfast  . mouth rinse  15 mL Mouth Rinse BID  . mometasone-formoterol  2 puff Inhalation BID  . multivitamin with minerals  1 tablet Oral Daily  . pravastatin  20 mg Oral q1800  . pyrazinamide  1,500 mg Oral Daily  . rifampin  600 mg Oral  Daily  . thiamine  100 mg Oral Daily   Continuous Infusions: . sodium chloride Stopped (05/23/19 0250)   PRN Meds:.sodium chloride, acetaminophen **OR** acetaminophen, alum & mag hydroxide-simeth, dextrose, HYDROcodone-acetaminophen, hydrocortisone cream, ipratropium, levalbuterol, loperamide, magic mouthwash w/lidocaine, ondansetron **OR** ondansetron (ZOFRAN) IV, phenol, Resource ThickenUp Clear, traZODone   SUBJECTIVE: He is feeling much better.  His appetite is much improved and he continues to gain weight.  He has been walking in the room without significant dyspnea.  He still has a productive cough but it has decreased.  He is very grateful the care he has been receiving here.  Review of Systems: Review of Systems  Constitutional: Positive for malaise/fatigue. Negative for chills, diaphoresis, fever and weight loss.  Respiratory: Positive for cough and sputum production. Negative for hemoptysis and shortness of breath.   Cardiovascular: Negative for chest pain.  Gastrointestinal: Negative for abdominal pain, diarrhea, nausea and vomiting.    No Known Allergies  OBJECTIVE: Vitals:   07/03/19 2243 07/04/19 0451 07/04/19 0500 07/04/19 0841  BP: (!) 152/76 (!) 154/83    Pulse: 100 85    Resp: 18 18    Temp: 98 F (36.7 C) 98.1 F (36.7 C)    TempSrc: Oral Oral    SpO2: 96% 96%  94%  Weight:  64 kg   Height:       Body mass index is 22.77 kg/m.  Physical Exam Constitutional:      Comments: He is talkative and in good spirits.  He is sitting up on the side of the bed.  Cardiovascular:     Rate and Rhythm: Normal rate and regular rhythm.     Heart sounds: No murmur.  Pulmonary:     Effort: Pulmonary effort is normal.     Breath sounds: No wheezing, rhonchi or rales.     Comments: Just a few crackles in the left lung base posteriorly. Abdominal:     Palpations: Abdomen is soft.     Tenderness: There is no abdominal tenderness.  Skin:    Findings: No erythema.   Psychiatric:        Mood and Affect: Mood normal.     Lab Results Lab Results  Component Value Date   WBC 7.4 07/03/2019   HGB 11.8 (L) 07/03/2019   HCT 38.5 (L) 07/03/2019   MCV 85.9 07/03/2019   PLT 247 07/03/2019    Lab Results  Component Value Date   CREATININE 1.35 (H) 07/03/2019   BUN 42 (H) 07/03/2019   NA 134 (L) 07/03/2019   K 4.2 07/03/2019   CL 100 07/03/2019   CO2 22 07/03/2019    Lab Results  Component Value Date   ALT 11 06/28/2019   AST 19 06/28/2019   ALKPHOS 72 06/28/2019   BILITOT 0.3 06/28/2019     Microbiology: No results found for this or any previous visit (from the past 240 hour(s)).  Michel Bickers, MD [facility] [phone] pager   [phone] cell 07/04/2019, 10:05 AM

## 2019-07-04 NOTE — Progress Notes (Signed)
PROGRESS NOTE    Clarence Maldonado  KXF:[id number] DOB: 11/03/1945 DOA: 05/15/2019 PCP: [name], DO    Brief Narrative:  73 y.o.male with history of stroke, COPD, CKD stage III, tobacco use, diabetes mellitus type 2, hypertension. Patient presented secondary to weakness and frequent falls, found to have pulmonary TB  Assessment & Plan:   Principal Problem:   Pulmonary tuberculosis with cavitation Active Problems:   Essential hypertension   CKD (chronic kidney disease) stage 3, GFR 30-59 ml/min (HCC)   Hyperlipidemia   Tobacco use disorder   Controlled type 2 diabetes mellitus with stage 3 chronic kidney disease, without long-term current use of insulin (HCC)   Weight loss   Hyperlipidemia associated with type 2 diabetes mellitus (HCC)   Anemia   Moderate protein-calorie malnutrition (HCC)   Pulmonary TB C/WRIPE therapy per ID.  Most recentpositive AFB smear was 9/13, AFB culture pending Patient continues to make good progress in functional ability. -Continue rifampin, isoniazid, pyrazinamide -Significant disease burden based on the last sputum culture. -AFB sputum smear sent out on 9/11/2020negative. 06/18/2019 is positive. Per ID we will obtain 2 sputum specimens for AFB smear and culture every 2 weeks to ensure negative results before transfer to SNF -Repeat CXR reviewed, persistent infiltrates noted -Awaiting negative culture results before dispo  Acute kidney injury. On chronic kidney disease stage III Likely from poor p.o. intake. On admission serum creatinine noted to be 1.77 -Most recent Cr 1.35  Dysphagia Evaluated by speech therapy. MBS significant for evidence of moderate oropharyngeal dysphagia with sensorimotor deficit and hypomotility of the pharynx. Patient deemed a high aspiration risk and placed on a dysphagia 1 diet. -Initially patient was on dysphagia diet. Currently on regular diet. This show significant improvement in patient's  deconditioning. -Speech therapy consult appreciated.  Peripheral neuropathy Concern this could be related to RIPE therapy. Started on B6.  Symptoms resolved for now.  Left atrial mobile mass Echocardiogram on 05/16/2019 showed a possible left atrial mobile mass. 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 Outpatient cardiology follow-up.  Non-sustained V-tach Resolved. Hypokalemia and magnesium likely contributed. Biweekly labs.  Hypokalemia/Hypomagnesemia Potassium normalized  Iron deficiency anemia -Continue iron supplementation No bleeding reported.  Diabetes mellitus, type 2, controlled without any complication. -Hemoglobin A1c 6.3 on 05/16/2019. Blood sugars are well controlled. Continue SSI  Weight loss Nutrition/PT consulted  Insomnia. Trazodone at nightly as needed. We will continue it on discharge.  DVT prophylaxis: Lovenox subq Code Status: Full Family Communication: Pt in room, family not at bedside Disposition Plan: Uncertain at this time  Consultants:   ID  Procedures:     Antimicrobials: Anti-infectives (From admission, onward)   Start     Dose/Rate Route Frequency Ordered Stop   06/03/19 1000  ethambutol (MYAMBUTOL) tablet 1,200 mg  Status:  Discontinued     1,200 mg Oral Daily 06/02/19 1735 06/27/19 1339   06/03/19 1000  isoniazid (NYDRAZID) tablet 300 mg     300 mg Oral Daily 06/02/19 1735     06/03/19 1000  pyrazinamide tablet 1,500 mg     1,500 mg Oral Daily 06/02/19 1735     06/03/19 1000  rifampin (RIFADIN) capsule 600 mg     600 mg Oral Daily 06/02/19 1735     05/24/19 1000  pyrazinamide tablet 1,500 mg  Status:  Discontinued     1,500 mg Oral Daily 05/23/19 1626 06/02/19 1555   05/24/19 1000  ethambutol (MYAMBUTOL) tablet 1,200 mg  Status:  Discontinued  1,200 mg Oral Daily 05/23/19 1626 06/02/19 1555   05/19/19 1600  isoniazid (NYDRAZID) tablet 300 mg  Status:  Discontinued     300 mg Oral Daily 05/19/19 1428  06/02/19 1555   05/19/19 1600  rifampin (RIFADIN) capsule 600 mg  Status:  Discontinued     600 mg Oral Daily 05/19/19 1428 06/02/19 1555   05/19/19 1600  pyrazinamide tablet 1,000 mg  Status:  Discontinued     1,000 mg Oral Daily 05/19/19 1428 05/23/19 1626   05/19/19 1600  ethambutol (MYAMBUTOL) tablet 800 mg  Status:  Discontinued     800 mg Oral Daily 05/19/19 1428 05/23/19 1626   05/18/19 0000  vancomycin (VANCOCIN) IVPB 1000 mg/200 mL premix  Status:  Discontinued     1,000 mg 200 mL/hr over 60 Minutes Intravenous Every 36 hours 05/16/19 1219 05/17/19 1058   05/16/19 1130  vancomycin (VANCOCIN) 1,250 mg in sodium chloride 0.9 % 250 mL IVPB     1,250 mg 166.7 mL/hr over 90 Minutes Intravenous  Once 05/16/19 1100 05/16/19 1404   05/16/19 1000  ampicillin-sulbactam (UNASYN) 1.5 g in sodium chloride 0.9 % 100 mL IVPB     1.5 g 200 mL/hr over 30 Minutes Intravenous Every 6 hours 05/16/19 0952 05/23/19 2359      Subjective: No complaints. Reports feeling stronger. Eager to go to SNF  Objective: Vitals:   07/04/19 0451 07/04/19 0500 07/04/19 0841 07/04/19 1255  BP: (!) 154/83   136/87  Pulse: 85   87  Resp: 18   18  Temp: 98.1 F (36.7 C)   98.5 F (36.9 C)  TempSrc: Oral   Oral  SpO2: 96%  94% 95%  Weight:  64 kg    Height:        Intake/Output Summary (Last 24 hours) at 07/04/2019 1842 Last data filed at 07/04/2019 1741 Gross per 24 hour  Intake -  Output 850 ml  Net -850 ml   Filed Weights   07/01/19 0654 07/03/19 0700 07/04/19 0500  Weight: 62 kg 63.5 kg 64 kg    Examination: General exam: Awake, laying in bed, in nad Respiratory system: Normal respiratory effort, no wheezing Cardiovascular system: regular rate, s1, s2 Gastrointestinal system: Soft, nondistended, positive BS Central nervous system: CN2-12 grossly intact, strength intact Extremities: Perfused, no clubbing Skin: Normal skin turgor, no notable skin lesions seen Psychiatry: Mood normal // no  visual hallucinations   Data Reviewed: I have personally reviewed following labs and imaging studies  CBC: Recent Labs  Lab 07/03/19 0408  WBC 7.4  HGB 11.8*  HCT 38.5*  MCV 85.9  PLT 132   Basic Metabolic Panel: Recent Labs  Lab 07/03/19 0408  NA 134*  K 4.2  CL 100  CO2 22  GLUCOSE 110*  BUN 42*  CREATININE 1.35*  CALCIUM 9.5   GFR: Estimated Creatinine Clearance: 44.6 mL/min (A) (by C-G formula based on SCr of 1.35 mg/dL (H)). Liver Function Tests: Recent Labs  Lab 06/28/19 0508  AST 19  ALT 11  ALKPHOS 72  BILITOT 0.3  PROT 7.1  ALBUMIN 2.9*   No results for input(s): LIPASE, AMYLASE in the last 168 hours. No results for input(s): AMMONIA in the last 168 hours. Coagulation Profile: No results for input(s): INR, PROTIME in the last 168 hours. Cardiac Enzymes: No results for input(s): CKTOTAL, CKMB, CKMBINDEX, TROPONINI in the last 168 hours. BNP (last 3 results) No results for input(s): PROBNP in the last 8760 hours. HbA1C: No results  for input(s): HGBA1C in the last 72 hours. CBG: Recent Labs  Lab 06/30/19 1559 07/01/19 0851 07/02/19 0945 07/03/19 0532 07/04/19 0446  GLUCAP 171* 141* 100* 102* 123*   Lipid Profile: No results for input(s): CHOL, HDL, LDLCALC, TRIG, CHOLHDL, LDLDIRECT in the last 72 hours. Thyroid Function Tests: No results for input(s): TSH, T4TOTAL, FREET4, T3FREE, THYROIDAB in the last 72 hours. Anemia Panel: No results for input(s): VITAMINB12, FOLATE, FERRITIN, TIBC, IRON, RETICCTPCT in the last 72 hours. Sepsis Labs: No results for input(s): PROCALCITON, LATICACIDVEN in the last 168 hours.  No results found for this or any previous visit (from the past 240 hour(s)).   Radiology Studies: No results found.  Scheduled Meds: . buPROPion  150 mg Oral Daily  . clotrimazole   Topical BID  . enoxaparin (LOVENOX) injection  40 mg Subcutaneous Q24H  . famotidine  20 mg Oral Daily  . feeding supplement (ENSURE ENLIVE)  237  mL Oral Q24H  . ferrous sulfate  325 mg Oral BID WC  . isoniazid  300 mg Oral Daily   And  . vitamin B-6  50 mg Oral Daily  . loratadine  10 mg Oral Q breakfast  . mouth rinse  15 mL Mouth Rinse BID  . mometasone-formoterol  2 puff Inhalation BID  . multivitamin with minerals  1 tablet Oral Daily  . pravastatin  20 mg Oral q1800  . pyrazinamide  1,500 mg Oral Daily  . rifampin  600 mg Oral Daily  . thiamine  100 mg Oral Daily   Continuous Infusions: . sodium chloride Stopped (05/23/19 0250)     LOS: 50 days   Rickey Barbara, MD [facility] Pager On Amion  If 7PM-7AM, please contact night-coverage 07/04/2019, 6:42 PM

## 2019-07-05 LAB — HEPATIC FUNCTION PANEL
ALT: 11 U/L (ref 0–44)
AST: 19 U/L (ref 15–41)
Albumin: 3.1 g/dL — ABNORMAL LOW (ref 3.5–5.0)
Alkaline Phosphatase: 74 U/L (ref 38–126)
Bilirubin, Direct: 0.1 mg/dL (ref 0.0–0.2)
Indirect Bilirubin: 0.3 mg/dL (ref 0.3–0.9)
Total Bilirubin: 0.4 mg/dL (ref 0.3–1.2)
Total Protein: 6.9 g/dL (ref 6.5–8.1)

## 2019-07-05 LAB — GLUCOSE, CAPILLARY
Glucose-Capillary: 88 mg/dL (ref 70–99)
Glucose-Capillary: 146 mg/dL — ABNORMAL HIGH (ref 70–99)
Glucose-Capillary: 128 mg/dL — ABNORMAL HIGH (ref 70–99)

## 2019-07-05 NOTE — Progress Notes (Signed)
AFB sent on 9/29 remains positive - Dr. Broadus [name] made aware

## 2019-07-05 NOTE — Progress Notes (Signed)
Occupational Therapy Treatment Patient Details Name: Clarence Maldonado MRN: [id number] DOB: 1946-04-06 Today's Date: 07/05/2019    History of present illness 73 y.o. male with medical history significant of cardiomegaly, stroke, COPD,   CKD stage III, tobacco abuse, DM 2, HTN who presented to the emergency room on 8/10 with progressive weakness over the last 3 months to the point where he was falling over. He has lost approximately 43 pounds in the past 2 months. Pt found to have Pulmonary tuberculosis with cavitation   OT comments  Pt fatiqued, reported dizziness about 15 min earlier when he was up. Still didn't feel back to baseline. BP wfls (see vitals flowsheets). Worked on level 2 theraband exercises at bed level.  Follow Up Recommendations  Home health OT;Supervision/Assistance - 24 hour    Equipment Recommendations  3 in 1 bedside commode    Recommendations for Other Services      Precautions / Restrictions Precautions Precautions: Fall Precaution Comments: monitor O2       Mobility Bed Mobility                  Transfers                      Balance                                           ADL either performed or assessed with clinical judgement   ADL                                         General ADL Comments: pt had completed ADL earlier.  Had returned from standing at window and felt a little dizzy--BP wfls.  Did not feel like getting up again. Worked on ther ex     Sport and exercise psychologist Arousal/Alertness: Awake/alert Behavior During Therapy: WFL for tasks assessed/performed Overall Cognitive Status: Within Functional Limits for tasks assessed                                          Exercises General Exercises - Upper Extremity Shoulder Flexion: Strengthening;Both;10 reps;Theraband Theraband Level (Shoulder Flexion): Level 2 (Red) Shoulder  Horizontal ABduction: Strengthening;Both;10 reps;Theraband Theraband Level (Shoulder Horizontal Abduction): Level 2 (Red)   Shoulder Instructions       General Comments upon entering room, pt in bed and stated he was up to window, felt dizzy and returned to bed. BP wfls:  132/79    Pertinent Vitals/ Pain       Pain Assessment: No/denies pain  Home Living                                          Prior Functioning/Environment              Frequency  Min 2X/week        Progress Toward Goals  OT Goals(current goals can now be found in the care plan section)  Progress towards OT goals: Progressing toward  goals     Plan      Co-evaluation                 AM-PAC OT "6 Clicks" Daily Activity     Outcome Measure   Help from another person eating meals?: None Help from another person taking care of personal grooming?: A Little Help from another person toileting, which includes using toliet, bedpan, or urinal?: A Little Help from another person bathing (including washing, rinsing, drying)?: A Little Help from another person to put on and taking off regular upper body clothing?: A Little Help from another person to put on and taking off regular lower body clothing?: A Little 6 Click Score: 19    End of Session    OT Visit Diagnosis: Unsteadiness on feet (R26.81)   Activity Tolerance Treatment limited secondary to medical complications (Comment)   Patient Left in bed;with call bell/phone within reach;with bed alarm set   Nurse Communication (pt reported dizziness when he was OOB earlier)        Time: 4680-3212 OT Time Calculation (min): 19 min  Charges: OT General Charges $OT Visit: 1 Visit OT Treatments $Therapeutic Activity: 8-22 mins  Lesle Chris, OTR/L Acute Rehabilitation Services [phone] [facility] pager [phone] office 07/05/2019   [place] 07/05/2019, 2:31 PM

## 2019-07-05 NOTE — Progress Notes (Signed)
PROGRESS NOTE    Clarence Maldonado  OEV:[id number] DOB: [birth year] DOA: 05/15/2019 PCP: [name], DO    Brief Narrative:  73 y.o.male with history of stroke, COPD, CKD stage III, tobacco use, diabetes mellitus type 2, hypertension. Patient presented secondary to weakness and frequent falls, found to have pulmonary TB  Assessment & Plan:    Severe pulmonary TB -Infectious disease following, -Continues to make progress on current regimen of rifampin isoniazid and ethambutol -Noted to have significant disease burden based on last sputum culture, recent AFB on 9/13 was positive -Repeat x-ray notes persistent infiltrates -Follow-up repeat AFB from 9/29 -Per ID need further evidence of culture negativity before transfer to SNF and to debilitated to live independently -Discharge planning challenging at this time  Acute kidney injury. On chronic kidney disease stage III Likely from poor p.o. intake. On admission serum creatinine noted to be 1.77 -Now stable in the 1.3 range  Dysphagia Evaluated by speech therapy. MBS significant for evidence of moderate oropharyngeal dysphagia with sensorimotor deficit and hypomotility of the pharynx. Patient deemed a high aspiration risk and placed on a dysphagia 1 diet, subsequently improved and transition to regular diet -Speech therapy following  Peripheral neuropathy Concern this could be related to RIPE therapy. Started on B6.  Symptoms resolved for now.  Left atrial mobile mass Echocardiogram on 05/16/2019 showed a possible left atrial mobile mass. 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 Outpatient cardiology follow-up.  Non-sustained V-tach Resolved. Hypokalemia and magnesium likely contributed. Biweekly labs.  Hypokalemia/Hypomagnesemia Potassium normalized  Iron deficiency anemia -Continue iron supplementation No bleeding reported.  Diabetes mellitus, type 2, controlled without any complication.  -Hemoglobin A1c 6.3 on 05/16/2019. Blood sugars are well controlled. Continue SSI  Moderate protein calorie malnutrition -RD consult appreciated, continue supplements as tolerated  Insomnia. -Trazodone nightly  DVT prophylaxis: Lovenox subq Code Status: Full Family Communication: Pt in room, family not at bedside Disposition Plan: Uncertain at this time  Consultants:   ID  Procedures:     Antimicrobials: Anti-infectives (From admission, onward)   Start     Dose/Rate Route Frequency Ordered Stop   06/03/19 1000  ethambutol (MYAMBUTOL) tablet 1,200 mg  Status:  Discontinued     1,200 mg Oral Daily 06/02/19 1735 06/27/19 1339   06/03/19 1000  isoniazid (NYDRAZID) tablet 300 mg     300 mg Oral Daily 06/02/19 1735     06/03/19 1000  pyrazinamide tablet 1,500 mg     1,500 mg Oral Daily 06/02/19 1735     06/03/19 1000  rifampin (RIFADIN) capsule 600 mg     600 mg Oral Daily 06/02/19 1735     05/24/19 1000  pyrazinamide tablet 1,500 mg  Status:  Discontinued     1,500 mg Oral Daily 05/23/19 1626 06/02/19 1555   05/24/19 1000  ethambutol (MYAMBUTOL) tablet 1,200 mg  Status:  Discontinued     1,200 mg Oral Daily 05/23/19 1626 06/02/19 1555   05/19/19 1600  isoniazid (NYDRAZID) tablet 300 mg  Status:  Discontinued     300 mg Oral Daily 05/19/19 1428 06/02/19 1555   05/19/19 1600  rifampin (RIFADIN) capsule 600 mg  Status:  Discontinued     600 mg Oral Daily 05/19/19 1428 06/02/19 1555   05/19/19 1600  pyrazinamide tablet 1,000 mg  Status:  Discontinued     1,000 mg Oral Daily 05/19/19 1428 05/23/19 1626   05/19/19 1600  ethambutol (MYAMBUTOL) tablet 800 mg  Status:  Discontinued  800 mg Oral Daily 05/19/19 1428 05/23/19 1626   05/18/19 0000  vancomycin (VANCOCIN) IVPB 1000 mg/200 mL premix  Status:  Discontinued     1,000 mg 200 mL/hr over 60 Minutes Intravenous Every 36 hours 05/16/19 1219 05/17/19 1058   05/16/19 1130  vancomycin (VANCOCIN) 1,250 mg in sodium chloride  0.9 % 250 mL IVPB     1,250 mg 166.7 mL/hr over 90 Minutes Intravenous  Once 05/16/19 1100 05/16/19 1404   05/16/19 1000  ampicillin-sulbactam (UNASYN) 1.5 g in sodium chloride 0.9 % 100 mL IVPB     1.5 g 200 mL/hr over 30 Minutes Intravenous Every 6 hours 05/16/19 0952 05/23/19 2359      Subjective: -Better overall, cough and shortness of breath are improving  Objective: Vitals:   07/05/19 0522 07/05/19 0832 07/05/19 0836 07/05/19 1300  BP: 135/66   132/79  Pulse: 84     Resp: 18     Temp: 97.7 F (36.5 C)     TempSrc: Oral     SpO2: 96% (!) 80% 98%   Weight: 65.1 kg     Height:        Intake/Output Summary (Last 24 hours) at 07/05/2019 1509 Last data filed at 07/05/2019 0900 Gross per 24 hour  Intake 360 ml  Output 1150 ml  Net -790 ml   Filed Weights   07/03/19 0700 07/04/19 0500 07/05/19 0522  Weight: 63.5 kg 64 kg 65.1 kg    Examination: Gen: Awake, Alert, Oriented X 3, chronically ill-appearing, no distress HEENT: PERRLA, Neck supple, no JVD Lungs: Scattered rhonchi CVS: RRR,No Gallops,Rubs or new Murmurs Abd: soft, Non tender, non distended, BS present Extremities: No edema Skin: no new rashes  Data Reviewed: I have personally reviewed following labs and imaging studies  CBC: Recent Labs  Lab 07/03/19 0408  WBC 7.4  HGB 11.8*  HCT 38.5*  MCV 85.9  PLT 247   Basic Metabolic Panel: Recent Labs  Lab 07/03/19 0408  NA 134*  K 4.2  CL 100  CO2 22  GLUCOSE 110*  BUN 42*  CREATININE 1.35*  CALCIUM 9.5   GFR: Estimated Creatinine Clearance: 44.6 mL/min (A) (by C-G formula based on SCr of 1.35 mg/dL (H)). Liver Function Tests: Recent Labs  Lab 07/05/19 0427  AST 19  ALT 11  ALKPHOS 74  BILITOT 0.4  PROT 6.9  ALBUMIN 3.1*   No results for input(s): LIPASE, AMYLASE in the last 168 hours. No results for input(s): AMMONIA in the last 168 hours. Coagulation Profile: No results for input(s): INR, PROTIME in the last 168 hours. Cardiac  Enzymes: No results for input(s): CKTOTAL, CKMB, CKMBINDEX, TROPONINI in the last 168 hours. BNP (last 3 results) No results for input(s): PROBNP in the last 8760 hours. HbA1C: No results for input(s): HGBA1C in the last 72 hours. CBG: Recent Labs  Lab 07/03/19 0532 07/04/19 0446 07/05/19 0517 07/05/19 0842 07/05/19 1238  GLUCAP 102* 123* 88 146* 128*   Lipid Profile: No results for input(s): CHOL, HDL, LDLCALC, TRIG, CHOLHDL, LDLDIRECT in the last 72 hours. Thyroid Function Tests: No results for input(s): TSH, T4TOTAL, FREET4, T3FREE, THYROIDAB in the last 72 hours. Anemia Panel: No results for input(s): VITAMINB12, FOLATE, FERRITIN, TIBC, IRON, RETICCTPCT in the last 72 hours. Sepsis Labs: No results for input(s): PROCALCITON, LATICACIDVEN in the last 168 hours.  No results found for this or any previous visit (from the past 240 hour(s)).   Radiology Studies: No results found.  Scheduled Meds: .  buPROPion  150 mg Oral Daily  . clotrimazole   Topical BID  . enoxaparin (LOVENOX) injection  40 mg Subcutaneous Q24H  . famotidine  20 mg Oral Daily  . feeding supplement (ENSURE ENLIVE)  237 mL Oral Q24H  . ferrous sulfate  325 mg Oral BID WC  . isoniazid  300 mg Oral Daily   And  . vitamin B-6  50 mg Oral Daily  . loratadine  10 mg Oral Q breakfast  . mouth rinse  15 mL Mouth Rinse BID  . mometasone-formoterol  2 puff Inhalation BID  . multivitamin with minerals  1 tablet Oral Daily  . pravastatin  20 mg Oral q1800  . pyrazinamide  1,500 mg Oral Daily  . rifampin  600 mg Oral Daily  . thiamine  100 mg Oral Daily   Continuous Infusions: . sodium chloride Stopped (05/23/19 0250)     LOS: 51 days   Domenic Polite, MD [facility]  07/05/2019, 3:09 PM

## 2019-07-05 NOTE — Progress Notes (Signed)
Rt went to do pt inhaler pt was found on RA sats 80%. Rt placed pt on 2 LPM [place] sats 98%. No distress noted at this time.

## 2019-07-05 NOTE — Progress Notes (Signed)
Physical Therapy Treatment Patient Details Name: Clarence Maldonado MRN: [id number] DOB: [birth year] Today's Date: 07/05/2019    History of Present Illness 73 y.o. male with medical history significant of cardiomegaly, stroke, COPD,   CKD stage III, tobacco abuse, DM 2, HTN who presented to the emergency room on 8/10 with progressive weakness over the last 3 months to the point where he was falling over. He has lost approximately 43 pounds in the past 2 months. Pt found to have Pulmonary tuberculosis with cavitation    PT Comments    Pt continues to participate well.    Follow Up Recommendations  SNF;Home health PT;Supervision/Assistance - 24 hour     Equipment Recommendations  Rolling walker with 5" wheels    Recommendations for Other Services       Precautions / Restrictions Precautions Precautions: Fall Precaution Comments: monitor O2 Restrictions Weight Bearing Restrictions: No    Mobility  Bed Mobility Overal bed mobility: Modified Independent                Transfers Overall transfer level: Needs assistance Equipment used: Rolling walker (2 wheeled) Transfers: Sit to/from Stand Sit to Stand: Supervision         General transfer comment: for safety  Ambulation/Gait Ambulation/Gait assistance: Supervision Gait Distance (Feet): 40 Feet(40'x1, 15'x1) Assistive device: Rolling walker (2 wheeled) Gait Pattern/deviations: Step-through pattern;Decreased stride length     General Gait Details: Remained on [place] O2 since sats dropped on RA with RT this a.m. Dyspnea 2/4. Pt tolerated distance fairly well.   Stairs             Wheelchair Mobility    Modified Rankin (Stroke Patients Only)       Balance Overall balance assessment: Needs assistance           Standing balance-Leahy Scale: Fair                              Cognition Arousal/Alertness: Awake/alert Behavior During Therapy: WFL for tasks assessed/performed Overall  Cognitive Status: Within Functional Limits for tasks assessed                                        Exercises General Exercises - Upper Extremity Shoulder Flexion: Strengthening;Both;10 reps;Theraband Theraband Level (Shoulder Flexion): Level 2 (Red) Shoulder Horizontal ABduction: Strengthening;Both;10 reps;Theraband Theraband Level (Shoulder Horizontal Abduction): Level 2 (Red)    General Comments General comments (skin integrity, edema, etc.): upon entering room, pt in bed and stated he was up to window, felt dizzy and returned to bed. BP wfls:  132/79      Pertinent Vitals/Pain Pain Assessment: No/denies pain    Home Living                      Prior Function            PT Goals (current goals can now be found in the care plan section) Progress towards PT goals: Progressing toward goals    Frequency    Min 3X/week      PT Plan Current plan remains appropriate    Co-evaluation              AM-PAC PT "6 Clicks" Mobility   Outcome Measure  Help needed turning from your back to your side while in a flat bed without using bedrails?:  None Help needed moving from lying on your back to sitting on the side of a flat bed without using bedrails?: None Help needed moving to and from a bed to a chair (including a wheelchair)?: A Little Help needed standing up from a chair using your arms (e.g., wheelchair or bedside chair)?: A Little Help needed to walk in hospital room?: A Little Help needed climbing 3-5 steps with a railing? : A Little 6 Click Score: 20    End of Session Equipment Utilized During Treatment: Oxygen Activity Tolerance: Patient tolerated treatment well Patient left: in bed;with call bell/phone within reach   PT Visit Diagnosis: Muscle weakness (generalized) (M62.81);Difficulty in walking, not elsewhere classified (R26.2)     Time: 5093-2671 PT Time Calculation (min) (ACUTE ONLY): 24 min  Charges:  $Gait Training: 8-22  mins                       Rebeca Alert, PT Acute Rehabilitation Services Pager: [phone] Office: [phone]

## 2019-07-06 LAB — GLUCOSE, CAPILLARY
Glucose-Capillary: 80 mg/dL (ref 70–99)
Glucose-Capillary: 114 mg/dL — ABNORMAL HIGH (ref 70–99)

## 2019-07-06 NOTE — Progress Notes (Signed)
No changes remains stable on Anti TB Rx -last AFB from 9/29 remains positive  Domenic Polite, MD

## 2019-07-07 DIAGNOSIS — R531 Weakness: Secondary | ICD-10-CM

## 2019-07-07 LAB — GLUCOSE, CAPILLARY: Glucose-Capillary: 74 mg/dL (ref 70–99)

## 2019-07-07 NOTE — Progress Notes (Addendum)
PROGRESS NOTE    Clarence Maldonado  VAN:[id number] DOB: [birth year] DOA: 05/15/2019 PCP: [name], DO    Brief Narrative:  73 y.o.male with history of stroke, COPD, CKD stage III, tobacco use, diabetes mellitus type 2, hypertension. Patient presented secondary to weakness and frequent falls, found to have pulmonary TB. -Clinically improving on anti-TB treatment  Assessment & Plan:   Severe pulmonary TB -Infectious disease following, -Continues to make progress on current regimen of rifampin isoniazid and ethambutol, day 10 rifampin, pyrazinamide, INH and day 29 of treatment -Noted to have significant disease burden based on last sputum culture, recent AFB on 9/13 was positive -Repeat x-ray notes persistent infiltrates -Repeat sputum from 9/29 still positive -Quite physically deconditioned to discharge home safely without assistance, and needs to be smear negative for discharge to rehab -Discharge planning challenging at this time -Recheck AFB on 10/6  Acute kidney injury. On chronic kidney disease stage III Likely from poor p.o. intake. On admission serum creatinine noted to be 1.77 -Now stable in the 1.3 range  Dysphagia Evaluated by speech therapy. MBS significant for evidence of moderate oropharyngeal dysphagia with sensorimotor deficit and hypomotility of the pharynx. Patient deemed a high aspiration risk and placed on a dysphagia 1 diet, subsequently improved and transition to regular diet -Speech therapy following  Peripheral neuropathy Concern this could be related to RIPE therapy. Started on B6.  Symptoms resolved for now.  Left atrial mobile mass Echocardiogram on 05/16/2019 showed a possible left atrial mobile mass. 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 Outpatient cardiology follow-up.  Non-sustained V-tach Resolved. Hypokalemia and magnesium likely contributed. Biweekly labs.  Hypokalemia/Hypomagnesemia Potassium normalized   Iron deficiency anemia -Continue iron supplementation No bleeding reported.  Diabetes mellitus, type 2, controlled without any complication. -Hemoglobin A1c 6.3 on 05/16/2019. Blood sugars are well controlled. Continue SSI  Moderate protein calorie malnutrition -RD consult appreciated, continue supplements as tolerated  Insomnia. -Trazodone nightly  DVT prophylaxis: Lovenox subq Code Status: Full Family Communication: Pt in room, family not at bedside Disposition Plan: Uncertain at this time  Consultants:   ID  Procedures:     Antimicrobials: Anti-infectives (From admission, onward)   Start     Dose/Rate Route Frequency Ordered Stop   06/03/19 1000  ethambutol (MYAMBUTOL) tablet 1,200 mg  Status:  Discontinued     1,200 mg Oral Daily 06/02/19 1735 06/27/19 1339   06/03/19 1000  isoniazid (NYDRAZID) tablet 300 mg     300 mg Oral Daily 06/02/19 1735     06/03/19 1000  pyrazinamide tablet 1,500 mg     1,500 mg Oral Daily 06/02/19 1735     06/03/19 1000  rifampin (RIFADIN) capsule 600 mg     600 mg Oral Daily 06/02/19 1735     05/24/19 1000  pyrazinamide tablet 1,500 mg  Status:  Discontinued     1,500 mg Oral Daily 05/23/19 1626 06/02/19 1555   05/24/19 1000  ethambutol (MYAMBUTOL) tablet 1,200 mg  Status:  Discontinued     1,200 mg Oral Daily 05/23/19 1626 06/02/19 1555   05/19/19 1600  isoniazid (NYDRAZID) tablet 300 mg  Status:  Discontinued     300 mg Oral Daily 05/19/19 1428 06/02/19 1555   05/19/19 1600  rifampin (RIFADIN) capsule 600 mg  Status:  Discontinued     600 mg Oral Daily 05/19/19 1428 06/02/19 1555   05/19/19 1600  pyrazinamide tablet 1,000 mg  Status:  Discontinued     1,000 mg Oral Daily  05/19/19 1428 05/23/19 1626   05/19/19 1600  ethambutol (MYAMBUTOL) tablet 800 mg  Status:  Discontinued     800 mg Oral Daily 05/19/19 1428 05/23/19 1626   05/18/19 0000  vancomycin (VANCOCIN) IVPB 1000 mg/200 mL premix  Status:  Discontinued     1,000 mg 200  mL/hr over 60 Minutes Intravenous Every 36 hours 05/16/19 1219 05/17/19 1058   05/16/19 1130  vancomycin (VANCOCIN) 1,250 mg in sodium chloride 0.9 % 250 mL IVPB     1,250 mg 166.7 mL/hr over 90 Minutes Intravenous  Once 05/16/19 1100 05/16/19 1404   05/16/19 1000  ampicillin-sulbactam (UNASYN) 1.5 g in sodium chloride 0.9 % 100 mL IVPB     1.5 g 200 mL/hr over 30 Minutes Intravenous Every 6 hours 05/16/19 0952 05/23/19 2359      Subjective: -Feels better overall, starting to ambulate a little bit, continues to have productive cough, breathing is improved  Objective: Vitals:   07/06/19 2100 07/07/19 0500 07/07/19 0547 07/07/19 1314  BP: (!) 152/82  132/71 (!) 143/82  Pulse: 89  86 (!) 102  Resp: 18   18  Temp: 98.6 F (37 C)  98.1 F (36.7 C) 98.2 F (36.8 C)  TempSrc: Oral  Oral   SpO2: 96%  98% 95%  Weight:  63.7 kg    Height:        Intake/Output Summary (Last 24 hours) at 07/07/2019 1604 Last data filed at 07/07/2019 1100 Gross per 24 hour  Intake 960 ml  Output 1050 ml  Net -90 ml   Filed Weights   07/05/19 0522 07/06/19 0557 07/07/19 0500  Weight: 65.1 kg 63.7 kg 63.7 kg    Examination: Gen: Elderly chronically ill male alert awake, oriented x3 HEENT: PERRLA, Neck supple, no JVD Lungs: Few scattered rhonchi, CVS: RRR,No Gallops,Rubs or new Murmurs Abd: soft, Non tender, non distended, BS present Extremities: No edema Skin: no new rashes  Data Reviewed: I have personally reviewed following labs and imaging studies  CBC: Recent Labs  Lab 07/03/19 0408  WBC 7.4  HGB 11.8*  HCT 38.5*  MCV 85.9  PLT 247   Basic Metabolic Panel: Recent Labs  Lab 07/03/19 0408  NA 134*  K 4.2  CL 100  CO2 22  GLUCOSE 110*  BUN 42*  CREATININE 1.35*  CALCIUM 9.5   GFR: Estimated Creatinine Clearance: 44.6 mL/min (A) (by C-G formula based on SCr of 1.35 mg/dL (H)). Liver Function Tests: Recent Labs  Lab 07/05/19 0427  AST 19  ALT 11  ALKPHOS 74  BILITOT  0.4  PROT 6.9  ALBUMIN 3.1*   No results for input(s): LIPASE, AMYLASE in the last 168 hours. No results for input(s): AMMONIA in the last 168 hours. Coagulation Profile: No results for input(s): INR, PROTIME in the last 168 hours. Cardiac Enzymes: No results for input(s): CKTOTAL, CKMB, CKMBINDEX, TROPONINI in the last 168 hours. BNP (last 3 results) No results for input(s): PROBNP in the last 8760 hours. HbA1C: No results for input(s): HGBA1C in the last 72 hours. CBG: Recent Labs  Lab 07/05/19 0842 07/05/19 1238 07/06/19 0551 07/06/19 0906 07/07/19 0538  GLUCAP 146* 128* 80 114* 74   Lipid Profile: No results for input(s): CHOL, HDL, LDLCALC, TRIG, CHOLHDL, LDLDIRECT in the last 72 hours. Thyroid Function Tests: No results for input(s): TSH, T4TOTAL, FREET4, T3FREE, THYROIDAB in the last 72 hours. Anemia Panel: No results for input(s): VITAMINB12, FOLATE, FERRITIN, TIBC, IRON, RETICCTPCT in the last 72 hours. Sepsis  Labs: No results for input(s): PROCALCITON, LATICACIDVEN in the last 168 hours.  Recent Results (from the past 240 hour(s))  Acid Fast Smear (AFB)     Status: Abnormal   Collection Time: 07/04/19 11:15 AM   Specimen: Sputum  Result Value Ref Range Status   AFB Specimen Processing Concentration  Final   Acid Fast Smear Positive (A)  Final    Comment: (NOTE) 3+, 4-36 acid-fast bacilli per field at 400X magnification, fluorescent smear Reported to Mila Palmer. at 1709 07-05-19 CEM Faxed to [phone] Performed At: [facility] [place], [place] [id number] Rush Farmer MD [id number]    Source (AFB) SPUTUM  Final    Comment: Performed at [facility], [place] [address] [place], [place] [address]     Radiology Studies: No results found.  Scheduled Meds: . buPROPion  150 mg Oral Daily  . clotrimazole   Topical BID  . enoxaparin (LOVENOX) injection  40 mg Subcutaneous Q24H  . famotidine  20 mg Oral Daily  .  feeding supplement (ENSURE ENLIVE)  237 mL Oral Q24H  . ferrous sulfate  325 mg Oral BID WC  . isoniazid  300 mg Oral Daily   And  . vitamin B-6  50 mg Oral Daily  . loratadine  10 mg Oral Q breakfast  . mouth rinse  15 mL Mouth Rinse BID  . mometasone-formoterol  2 puff Inhalation BID  . multivitamin with minerals  1 tablet Oral Daily  . pravastatin  20 mg Oral q1800  . pyrazinamide  1,500 mg Oral Daily  . rifampin  600 mg Oral Daily  . thiamine  100 mg Oral Daily   Continuous Infusions: . sodium chloride Stopped (05/23/19 0250)     LOS: 39 days   Domenic Polite, MD [facility]  07/07/2019, 4:04 PM

## 2019-07-07 NOTE — Progress Notes (Signed)
    [place] for Infectious Disease   Reason for visit: Follow up on Tb  Interval History: repeat sputum from 9/29 smear positive; afebrile, no associated rash or diarrhea.  Still with some productive cough.  Day 29 treatment Day 10 rif/pyraz/INH  Physical Exam: Constitutional:  Vitals:   07/06/19 2100 07/07/19 0547  BP: (!) 152/82 132/71  Pulse: 89 86  Resp: 18   Temp: 98.6 F (37 C) 98.1 F (36.7 C)  SpO2: 96% 98%   patient appears in NAD Eyes: anicteric Respiratory: Normal respiratory effort; CTA B Cardiovascular: RRR GI: soft, nt, nd  Review of Systems: Constitutional: negative for fevers and chills Gastrointestinal: negative for nausea and diarrhea Integument/breast: negative for rash  Lab Results  Component Value Date   WBC 7.4 07/03/2019   HGB 11.8 (L) 07/03/2019   HCT 38.5 (L) 07/03/2019   MCV 85.9 07/03/2019   PLT 247 07/03/2019    Lab Results  Component Value Date   CREATININE 1.35 (H) 07/03/2019   BUN 42 (H) 07/03/2019   NA 134 (L) 07/03/2019   K 4.2 07/03/2019   CL 100 07/03/2019   CO2 22 07/03/2019    Lab Results  Component Value Date   ALT 11 07/05/2019   AST 19 07/05/2019   ALKPHOS 74 07/05/2019     Microbiology: Recent Results (from the past 240 hour(s))  Acid Fast Smear (AFB)     Status: Abnormal   Collection Time: 07/04/19 11:15 AM   Specimen: Sputum  Result Value Ref Range Status   AFB Specimen Processing Concentration  Final   Acid Fast Smear Positive (A)  Final    Comment: (NOTE) 3+, 4-36 acid-fast bacilli per field at 400X magnification, fluorescent smear Reported to Mila Palmer. at 1709 07-05-19 CEM Faxed to [phone] Performed At: [facility] [place], [place] [id number] Rush Farmer MD OB:[id number]    Source (AFB) SPUTUM  Final    Comment: Performed at [facility], [place] [address], [place], [place] [address]    Impression/Plan:  1. Cavitary pulmonary Tb - on 3 drug  therapy and susceptibilities noted.  Will continue.   Repeat sputum next week  2.  Medication monitoring - LFTs have remained stable with no transaminitis.  Continue to monitor weekly  3.  Weakness - if he needs rehab, he will need to be smear negative.  Otherwise if he can go home he can isolate there and the HD can monitor him and provide him treatment.

## 2019-07-07 NOTE — Progress Notes (Signed)
Physical Therapy Treatment Patient Details Name: Clarence Maldonado MRN: [id number] DOB: 1945-10-23 Today's Date: 07/07/2019    History of Present Illness 73 y.o. male with medical history significant of cardiomegaly, stroke, COPD,   CKD stage III, tobacco abuse, DM 2, HTN who presented to the emergency room on 8/10 with progressive weakness over the last 3 months to the point where he was falling over. He has lost approximately 43 pounds in the past 2 months. Pt found to have Pulmonary tuberculosis with cavitation    PT Comments    Pt with improved tolerance for ambulation today, walking a total of ~80 ft with RW with 3 seated rest breaks to recover dyspnea. Pt sats maintained >=92% on RA. Pt with tachycardia up to 137 bpm with activity, but pt's baseline upon PT entrance was 115 bpm. RN informed. PT to continue to follow acutely.    Follow Up Recommendations  SNF;Home health PT;Supervision/Assistance - 24 hour     Equipment Recommendations  Rolling walker with 5" wheels    Recommendations for Other Services       Precautions / Restrictions Precautions Precautions: Fall Precaution Comments: monitor O2 Restrictions Weight Bearing Restrictions: No    Mobility  Bed Mobility Overal bed mobility: Modified Independent Bed Mobility: Supine to Sit     Supine to sit: Modified independent (Device/Increase time)     General bed mobility comments: Mod I for increased time and use of bedrails  Transfers Overall transfer level: Needs assistance Equipment used: Rolling walker (2 wheeled) Transfers: Sit to/from Stand Sit to Stand: Supervision;From elevated surface         General transfer comment: supervision for safety  Ambulation/Gait Ambulation/Gait assistance: Min guard Gait Distance (Feet): 40 Feet(20+40+20 ft in room, with rest breaks to recover dyspnea 3/4) Assistive device: Rolling walker (2 wheeled) Gait Pattern/deviations: Step-through pattern;Decreased stride  length;Trunk flexed Gait velocity: decr   General Gait Details: Min guard for safety, sats 92-94% on RA and dyspnea 2/4. Pt required seated rest breaks x2 minutes to recover dyspnea. HR 115-137 bpm, HRmax immediately post-ambulation.   Stairs             Wheelchair Mobility    Modified Rankin (Stroke Patients Only)       Balance Overall balance assessment: Needs assistance Sitting-balance support: Feet supported Sitting balance-Leahy Scale: Good     Standing balance support: Bilateral upper extremity supported;No upper extremity supported Standing balance-Leahy Scale: Fair Standing balance comment: BUE support for dynamic standing tasks                            Cognition Arousal/Alertness: Awake/alert Behavior During Therapy: WFL for tasks assessed/performed Overall Cognitive Status: Within Functional Limits for tasks assessed                                        Exercises General Exercises - Lower Extremity Mini-Sqauts: AROM;Both;5 reps(increased time, requiring rest break between each)    General Comments        Pertinent Vitals/Pain Pain Assessment: No/denies pain    Home Living                      Prior Function            PT Goals (current goals can now be found in the care plan section) Acute Rehab PT  Goals Patient Stated Goal: I want to get stronger so I can go home to my dog crystal PT Goal Formulation: With patient Time For Goal Achievement: 07/17/19 Potential to Achieve Goals: Good Progress towards PT goals: Progressing toward goals    Frequency    Min 3X/week      PT Plan Current plan remains appropriate    Co-evaluation              AM-PAC PT "6 Clicks" Mobility   Outcome Measure  Help needed turning from your back to your side while in a flat bed without using bedrails?: None Help needed moving from lying on your back to sitting on the side of a flat bed without using bedrails?:  None Help needed moving to and from a bed to a chair (including a wheelchair)?: A Little Help needed standing up from a chair using your arms (e.g., wheelchair or bedside chair)?: A Little Help needed to walk in hospital room?: A Little Help needed climbing 3-5 steps with a railing? : A Little 6 Click Score: 20    End of Session Equipment Utilized During Treatment: Oxygen;Gait belt Activity Tolerance: Patient tolerated treatment well Patient left: with call bell/phone within reach;in chair;with chair alarm set Nurse Communication: Mobility status(tachycardia with ambulation, needs new bedding) PT Visit Diagnosis: Muscle weakness (generalized) (M62.81);Difficulty in walking, not elsewhere classified (R26.2)     Time: 8768-1157 PT Time Calculation (min) (ACUTE ONLY): 34 min  Charges:  $Gait Training: 23-37 mins                     Nicola Police, PT Acute Rehabilitation Services Pager [phone]  Office [phone]    Tyrone Apple D Despina Hidden 07/07/2019, 4:42 PM

## 2019-07-08 LAB — GLUCOSE, CAPILLARY: Glucose-Capillary: 114 mg/dL — ABNORMAL HIGH (ref 70–99)

## 2019-07-08 NOTE — Progress Notes (Signed)
Patient seen and examined, no changes, continues to improve slowly on anti-TB treatment for severe pulmonary tuberculosis -Encouraged to increase activity and ambulation -Recheck sputum AFB on 10/6  Domenic Polite, MD

## 2019-07-08 NOTE — Plan of Care (Signed)
  Problem: Activity: Goal: Risk for activity intolerance will decrease Outcome: Progressing   Problem: Safety: Goal: Ability to remain free from injury will improve Outcome: Progressing   

## 2019-07-09 LAB — GLUCOSE, CAPILLARY: Glucose-Capillary: 82 mg/dL (ref 70–99)

## 2019-07-09 NOTE — Progress Notes (Signed)
Patient seen, no changes, continues to improve slowly on anti-TB therapy -Encourage increase activity and ambulation -Last sputum for AFB positive on 9/29 -Recheck AFB 10/6 -Discharge planning is complicated as patient has active pulmonary TB, lives alone, is debilitated  Domenic Polite, MD

## 2019-07-10 LAB — CBC
HCT: 39.6 % (ref 39.0–52.0)
Hemoglobin: 12.1 g/dL — ABNORMAL LOW (ref 13.0–17.0)
MCH: 26.1 pg (ref 26.0–34.0)
MCHC: 30.6 g/dL (ref 30.0–36.0)
MCV: 85.3 fL (ref 80.0–100.0)
Platelets: 247 10*3/uL (ref 150–400)
RBC: 4.64 MIL/uL (ref 4.22–5.81)
RDW: 20.8 % — ABNORMAL HIGH (ref 11.5–15.5)
WBC: 7.3 10*3/uL (ref 4.0–10.5)
nRBC: 0 % (ref 0.0–0.2)

## 2019-07-10 LAB — BASIC METABOLIC PANEL
Anion gap: 8 (ref 5–15)
BUN: 34 mg/dL — ABNORMAL HIGH (ref 8–23)
CO2: 26 mmol/L (ref 22–32)
Calcium: 9.4 mg/dL (ref 8.9–10.3)
Chloride: 100 mmol/L (ref 98–111)
Creatinine, Ser: 1.37 mg/dL — ABNORMAL HIGH (ref 0.61–1.24)
GFR calc Af Amer: 59 mL/min — ABNORMAL LOW (ref >60)
GFR calc non Af Amer: 51 mL/min — ABNORMAL LOW (ref >60)
Glucose, Bld: 89 mg/dL (ref 70–99)
Potassium: 3.8 mmol/L (ref 3.5–5.1)
Sodium: 134 mmol/L — ABNORMAL LOW (ref 135–145)

## 2019-07-10 LAB — AFB ORGANISM ID BY DNA PROBE
M avium complex: NEGATIVE
M tuberculosis complex: POSITIVE — AB

## 2019-07-10 LAB — ACID FAST CULTURE WITH REFLEXED SENSITIVITIES (MYCOBACTERIA): Acid Fast Culture: POSITIVE — AB

## 2019-07-10 LAB — GLUCOSE, CAPILLARY: Glucose-Capillary: 154 mg/dL — ABNORMAL HIGH (ref 70–99)

## 2019-07-10 MED ORDER — ENOXAPARIN SODIUM 40 MG/0.4ML ~~LOC~~ SOLN
40.0000 mg | SUBCUTANEOUS | Status: DC
  Administered 2019-07-10 – 2019-07-25 (×16): 40 mg via SUBCUTANEOUS
  Filled 2019-07-10 (×16): qty 0.4

## 2019-07-10 NOTE — Progress Notes (Signed)
Physical Therapy Treatment Patient Details Name: Clarence Maldonado MRN: [id number] DOB: 08/01/1946 Today's Date: 07/10/2019    History of Present Illness 73 y.o. male with medical history significant of cardiomegaly, stroke, COPD,   CKD stage III, tobacco abuse, DM 2, HTN who presented to the emergency room on 8/10 with progressive weakness over the last 3 months to the point where he was falling over. He has lost approximately 43 pounds in the past 2 months. Pt found to have Pulmonary tuberculosis with cavitation    PT Comments    Progressing with mobility. SpO2: 96% on RA at rest, 89-90% on RA during ambulation.  Dyspnea 2/4. Encouraged pt to walk with nursing supervision as able. Will continue to follow and progress activity.   Follow Up Recommendations  SNF;Home health PT;Supervision/Assistance - 24 hour     Equipment Recommendations  Rolling walker with 5" wheels    Recommendations for Other Services       Precautions / Restrictions Precautions Precautions: Fall Precaution Comments: monitor O2 Restrictions Weight Bearing Restrictions: No    Mobility  Bed Mobility Overal bed mobility: Modified Independent                Transfers Overall transfer level: Needs assistance Equipment used: Rolling walker (2 wheeled) Transfers: Sit to/from Stand Sit to Stand: Supervision         General transfer comment: for safety, hand placement  Ambulation/Gait Ambulation/Gait assistance: Supervision Gait Distance (Feet): 75 Feet(x2) Assistive device: Rolling walker (2 wheeled) Gait Pattern/deviations: Step-through pattern;Decreased stride length     General Gait Details: 3 laps, seated rest break, then 3 more laps. Spo2: 90% on RA. Dyspena 2/4. HR as high as 135 bpm.   Stairs             Wheelchair Mobility    Modified Rankin (Stroke Patients Only)       Balance Overall balance assessment: Needs assistance         Standing balance support: Bilateral  upper extremity supported Standing balance-Leahy Scale: Poor                              Cognition Arousal/Alertness: Awake/alert Behavior During Therapy: WFL for tasks assessed/performed Overall Cognitive Status: Within Functional Limits for tasks assessed                                        Exercises      General Comments        Pertinent Vitals/Pain Pain Assessment: No/denies pain    Home Living                      Prior Function            PT Goals (current goals can now be found in the care plan section) Progress towards PT goals: Progressing toward goals    Frequency    Min 3X/week      PT Plan Current plan remains appropriate    Co-evaluation              AM-PAC PT "6 Clicks" Mobility   Outcome Measure  Help needed turning from your back to your side while in a flat bed without using bedrails?: None Help needed moving from lying on your back to sitting on the side of a flat bed without using  bedrails?: None Help needed moving to and from a bed to a chair (including a wheelchair)?: A Little Help needed standing up from a chair using your arms (e.g., wheelchair or bedside chair)?: A Little Help needed to walk in hospital room?: A Little Help needed climbing 3-5 steps with a railing? : A Little 6 Click Score: 20    End of Session Equipment Utilized During Treatment: Gait belt Activity Tolerance: Patient tolerated treatment well Patient left: with call bell/phone within reach;in bed   PT Visit Diagnosis: Muscle weakness (generalized) (M62.81);Difficulty in walking, not elsewhere classified (R26.2)     Time: 8469-6295 PT Time Calculation (min) (ACUTE ONLY): 31 min  Charges:  $Gait Training: 23-37 mins                       Rebeca Alert, PT Acute Rehabilitation Services Pager: [phone] Office: [phone]

## 2019-07-10 NOTE — Progress Notes (Signed)
PROGRESS NOTE    Clarence Maldonado  ERX:[id number] DOB: Apr 18, 1946 DOA: 05/15/2019 PCP: [place] Curry, DO    Brief Narrative:  73 y.o.male with history of stroke, COPD, CKD stage III, tobacco use, diabetes mellitus type 2, hypertension. Patient presented secondary to weakness and frequent falls, found to have pulmonary TB. -Clinically improving on anti-TB treatment -Complicated by general debility, cognitive deficits, public health concerns given active pulmonary TB  Assessment & Plan:   Severe pulmonary TB -Infectious disease following, -Continues to make progress on current regimen of rifampin isoniazid and ethambutol, day 13 rifampin, pyrazinamide, INH and day 32 of treatment -Noted to have significant disease burden based on last sputum culture, recent AFB on 9/13 was positive -Repeat x-ray notes persistent infiltrates -Repeat sputum from 9/29 still positive -Quite physically deconditioned to discharge home safely without assistance,, also has cognitive, memory deficits, discussed with case management, social work apparently SNF is not an option because of his active pulmonary TB  -Discharge planning challenging at this time -Recheck AFB on 10/6  Acute kidney injury. On chronic kidney disease stage III Likely from poor p.o. intake. On admission serum creatinine noted to be 1.77 -Now stable in the 1.3 range  Dysphagia Evaluated by speech therapy. MBS significant for evidence of moderate oropharyngeal dysphagia with sensorimotor deficit and hypomotility of the pharynx. Patient deemed a high aspiration risk and placed on a dysphagia 1 diet, subsequently improved and transition to regular diet -Speech therapy following  Cognitive, memory deficits -Patient keeps forgetting and does not recall the fact that he is active pulmonary TB -Check TSH, B12  Peripheral neuropathy Concern this could be related to RIPE therapy. Started on B6.  Symptoms resolved for now.  Left  atrial mobile mass Echocardiogram on 05/16/2019 showed a possible left atrial mobile mass. Outpatient cardiology follow-up.  Non-sustained V-tach Resolved. Hypokalemia and magnesium likely contributed. Biweekly labs.  Hypokalemia/Hypomagnesemia Potassium normalized  Iron deficiency anemia -Continue iron supplementation No bleeding reported.  Diabetes mellitus, type 2, controlled without any complication. -Hemoglobin A1c 6.3 on 05/16/2019. Blood sugars are well controlled. Continue SSI  Moderate protein calorie malnutrition -RD consult appreciated, continue supplements as tolerated  Insomnia. -Trazodone nightly  DVT prophylaxis: Lovenox subq Code Status: Full Family Communication: Pt in room, family not at bedside Disposition Plan: Uncertain at this time  Consultants:   ID  Procedures:     Antimicrobials: Anti-infectives (From admission, onward)   Start     Dose/Rate Route Frequency Ordered Stop   06/03/19 1000  ethambutol (MYAMBUTOL) tablet 1,200 mg  Status:  Discontinued     1,200 mg Oral Daily 06/02/19 1735 06/27/19 1339   06/03/19 1000  isoniazid (NYDRAZID) tablet 300 mg     300 mg Oral Daily 06/02/19 1735     06/03/19 1000  pyrazinamide tablet 1,500 mg     1,500 mg Oral Daily 06/02/19 1735     06/03/19 1000  rifampin (RIFADIN) capsule 600 mg     600 mg Oral Daily 06/02/19 1735     05/24/19 1000  pyrazinamide tablet 1,500 mg  Status:  Discontinued     1,500 mg Oral Daily 05/23/19 1626 06/02/19 1555   05/24/19 1000  ethambutol (MYAMBUTOL) tablet 1,200 mg  Status:  Discontinued     1,200 mg Oral Daily 05/23/19 1626 06/02/19 1555   05/19/19 1600  isoniazid (NYDRAZID) tablet 300 mg  Status:  Discontinued     300 mg Oral Daily 05/19/19 1428 06/02/19 1555   05/19/19 1600  rifampin (RIFADIN)  capsule 600 mg  Status:  Discontinued     600 mg Oral Daily 05/19/19 1428 06/02/19 1555   05/19/19 1600  pyrazinamide tablet 1,000 mg  Status:  Discontinued     1,000  mg Oral Daily 05/19/19 1428 05/23/19 1626   05/19/19 1600  ethambutol (MYAMBUTOL) tablet 800 mg  Status:  Discontinued     800 mg Oral Daily 05/19/19 1428 05/23/19 1626   05/18/19 0000  vancomycin (VANCOCIN) IVPB 1000 mg/200 mL premix  Status:  Discontinued     1,000 mg 200 mL/hr over 60 Minutes Intravenous Every 36 hours 05/16/19 1219 05/17/19 1058   05/16/19 1130  vancomycin (VANCOCIN) 1,250 mg in sodium chloride 0.9 % 250 mL IVPB     1,250 mg 166.7 mL/hr over 90 Minutes Intravenous  Once 05/16/19 1100 05/16/19 1404   05/16/19 1000  ampicillin-sulbactam (UNASYN) 1.5 g in sodium chloride 0.9 % 100 mL IVPB     1.5 g 200 mL/hr over 30 Minutes Intravenous Every 6 hours 05/16/19 0952 05/23/19 2359      Subjective: -Reports breathing better, still coughing up productive sputum which is yellow, denies any blood tinges anymore -Keeps forgetting why he is hospitalized  Objective: Vitals:   07/09/19 1529 07/09/19 2038 07/10/19 0615 07/10/19 1044  BP: (!) 151/79 (!) 146/77 117/75 124/69  Pulse: 98 95 (!) 106 (!) 101  Resp: 18   18  Temp: 98.4 F (36.9 C) 98.6 F (37 C)  98.4 F (36.9 C)  TempSrc: Oral Oral  Oral  SpO2: 94% 94% 94% 96%  Weight:      Height:        Intake/Output Summary (Last 24 hours) at 07/10/2019 1434 Last data filed at 07/10/2019 1055 Gross per 24 hour  Intake -  Output 2725 ml  Net -2725 ml   Filed Weights   07/06/19 0557 07/07/19 0500 07/08/19 0531  Weight: 63.7 kg 63.7 kg 64 kg    Examination: Gen: Elderly Caucasian male, sitting up in bed, awake alert oriented x2, memory and mild cognitive deficits noted HEENT: PERRLA, Neck supple, no JVD Lungs: Scattered rhonchi throughout both lung fields CVS: RRR,No Gallops,Rubs or new Murmurs Abd: soft, Non tender, non distended, BS present Extremities: No edema Skin: no new rashes  Data Reviewed: I have personally reviewed following labs and imaging studies  CBC: Recent Labs  Lab 07/10/19 0412  WBC 7.3   HGB 12.1*  HCT 39.6  MCV 85.3  PLT 247   Basic Metabolic Panel: Recent Labs  Lab 07/10/19 0412  NA 134*  K 3.8  CL 100  CO2 26  GLUCOSE 89  BUN 34*  CREATININE 1.37*  CALCIUM 9.4   GFR: Estimated Creatinine Clearance: 44 mL/min (A) (by C-G formula based on SCr of 1.37 mg/dL (H)). Liver Function Tests: Recent Labs  Lab 07/05/19 0427  AST 19  ALT 11  ALKPHOS 74  BILITOT 0.4  PROT 6.9  ALBUMIN 3.1*   No results for input(s): LIPASE, AMYLASE in the last 168 hours. No results for input(s): AMMONIA in the last 168 hours. Coagulation Profile: No results for input(s): INR, PROTIME in the last 168 hours. Cardiac Enzymes: No results for input(s): CKTOTAL, CKMB, CKMBINDEX, TROPONINI in the last 168 hours. BNP (last 3 results) No results for input(s): PROBNP in the last 8760 hours. HbA1C: No results for input(s): HGBA1C in the last 72 hours. CBG: Recent Labs  Lab 07/06/19 0906 07/07/19 0538 07/08/19 0527 07/09/19 0826 07/10/19 0754  GLUCAP 114* 74 114*  82 154*   Lipid Profile: No results for input(s): CHOL, HDL, LDLCALC, TRIG, CHOLHDL, LDLDIRECT in the last 72 hours. Thyroid Function Tests: No results for input(s): TSH, T4TOTAL, FREET4, T3FREE, THYROIDAB in the last 72 hours. Anemia Panel: No results for input(s): VITAMINB12, FOLATE, FERRITIN, TIBC, IRON, RETICCTPCT in the last 72 hours. Sepsis Labs: No results for input(s): PROCALCITON, LATICACIDVEN in the last 168 hours.  Recent Results (from the past 240 hour(s))  Acid Fast Smear (AFB)     Status: Abnormal   Collection Time: 07/04/19 11:15 AM   Specimen: Sputum  Result Value Ref Range Status   AFB Specimen Processing Concentration  Final   Acid Fast Smear Positive (A)  Final    Comment: (NOTE) 3+, 4-36 acid-fast bacilli per field at 400X magnification, fluorescent smear Reported to Katherina Mires. at 1709 07-05-19 C[facility] Faxed to [phone] Performed At: [facility] [address] [place], [place]  [id number] Jolene Schimke MD JJ:[id number]    Source (AFB) SPUTUM  Final    Comment: Performed at [facility], [address], [place], [place] [address]     Radiology Studies: No results found.  Scheduled Meds: . buPROPion  150 mg Oral Daily  . clotrimazole   Topical BID  . enoxaparin (LOVENOX) injection  40 mg Subcutaneous Q24H  . famotidine  20 mg Oral Daily  . feeding supplement (ENSURE ENLIVE)  237 mL Oral Q24H  . ferrous sulfate  325 mg Oral BID WC  . isoniazid  300 mg Oral Daily   And  . vitamin B-6  50 mg Oral Daily  . loratadine  10 mg Oral Q breakfast  . mouth rinse  15 mL Mouth Rinse BID  . mometasone-formoterol  2 puff Inhalation BID  . multivitamin with minerals  1 tablet Oral Daily  . pravastatin  20 mg Oral q1800  . pyrazinamide  1,500 mg Oral Daily  . rifampin  600 mg Oral Daily  . thiamine  100 mg Oral Daily   Continuous Infusions: . sodium chloride Stopped (05/23/19 0250)     LOS: 56 days   Zannie Cove, MD [facility]  07/10/2019, 2:34 PM

## 2019-07-11 LAB — GLUCOSE, CAPILLARY: Glucose-Capillary: 89 mg/dL (ref 70–99)

## 2019-07-11 LAB — VITAMIN B12: Vitamin B-12: 457 pg/mL (ref 180–914)

## 2019-07-11 NOTE — Progress Notes (Signed)
Occupational Therapy Treatment and goal update Patient Details Name: Clarence Maldonado MRN: [id number] DOB: 1946/07/29 Today's Date: 07/11/2019    History of present illness 73 y.o. male with medical history significant of cardiomegaly, stroke, COPD,   CKD stage III, tobacco abuse, DM 2, HTN who presented to the emergency room on 8/10 with progressive weakness over the last 3 months to the point where he was falling over. He has lost approximately 43 pounds in the past 2 months. Pt found to have Pulmonary tuberculosis with cavitation   OT comments  Ambulated to bathroom, used toilet twice and stood to have hair washed.  Sats 88-95% on 2 liters.  Needs prolonged rest breaks, and pt does initiate these  Follow Up Recommendations  Home health OT;Supervision/Assistance - 24 hour    Equipment Recommendations  3 in 1 bedside commode    Recommendations for Other Services      Precautions / Restrictions Precautions Precautions: Fall Precaution Comments: monitor O2       Mobility Bed Mobility Overal bed mobility: Modified Independent                Transfers       Sit to Stand: Supervision              Balance                                           ADL either performed or assessed with clinical judgement   ADL                           Toilet Transfer: Supervision/safety;Ambulation;Regular Toilet;RW Toilet Transfer Details (indicate cue type and reason): cues to sidestep through tight space in bathroom Toileting- Clothing Manipulation and Hygiene: Supervision/safety;Set up;Sit to/from stand         General ADL Comments: stood at sink to have hair washed.  Pt needed extended rest breaks between activities.       Vision       Perception     Praxis      Cognition Arousal/Alertness: Awake/alert Behavior During Therapy: WFL for tasks assessed/performed Overall Cognitive Status: Within Functional Limits for tasks assessed                                           Exercises     Shoulder Instructions       General Comments sats 94% RA beginning of session, 88% after toileting and having hair washed; returned to 94% sitting EOB after rest break    Pertinent Vitals/ Pain       Pain Assessment: No/denies pain  Home Living                                          Prior Functioning/Environment              Frequency  Min 2X/week        Progress Toward Goals  OT Goals(current goals can now be found in the care plan section)  Progress towards OT goals: (goals updated)  Acute Rehab OT Goals Time For Goal Achievement: 07/25/19 Potential to Achieve Goals: Good  ADL Goals Pt Will Perform Grooming: with modified independence Pt Will Transfer to Toilet: with modified independence Pt Will Perform Toileting - Clothing Manipulation and hygiene: with modified independence Additional ADL Goal #1: pt will perform adl at mod I level Additional ADL Goal #2: pt will be independent with written HEP  Plan      Co-evaluation                 AM-PAC OT "6 Clicks" Daily Activity     Outcome Measure   Help from another person eating meals?: None Help from another person taking care of personal grooming?: A Little Help from another person toileting, which includes using toliet, bedpan, or urinal?: A Little Help from another person bathing (including washing, rinsing, drying)?: A Little Help from another person to put on and taking off regular upper body clothing?: A Little Help from another person to put on and taking off regular lower body clothing?: A Little 6 Click Score: 19    End of Session    OT Visit Diagnosis: Unsteadiness on feet (R26.81)   Activity Tolerance Treatment limited secondary to medical complications (Comment)   Patient Left in bed;with call bell/phone within reach;with bed alarm set   Nurse Communication          Time: 0160-1093 OT  Time Calculation (min): 42 min  Charges: OT General Charges $OT Visit: 1 Visit OT Treatments $Self Care/Home Management : 38-52 mins  Clarence Maldonado, OTR/L Acute Rehabilitation Services [phone] [facility] pager [phone] office 07/11/2019   Clarence Maldonado 07/11/2019, 1:58 PM

## 2019-07-11 NOTE — Progress Notes (Signed)
PROGRESS NOTE    Clarence Maldonado  QQV:[id number] DOB: 1946/06/03 DOA: 05/15/2019 PCP: [place]land Curry, DO    Brief Narrative:  73 y.o.male with history of stroke, COPD, CKD stage III, tobacco use, diabetes mellitus type 2, hypertension. Patient presented secondary to weakness and frequent falls, found to have severe pulmonary TB. -Clinically improving on anti-TB treatment -Complicated by general debility, cognitive deficits, public health concerns given active pulmonary TB -Last sputum AFB from 9/29 still positive for MTB -PT OT recommended SNF but this is not an option given active pulmonary TB  Assessment & Plan:   Severe pulmonary TB -Infectious disease following, -Continues to make progress on current regimen of rifampin isoniazid and ethambutol, day 14 rifampin, pyrazinamide, INH and day 33 of treatment -Noted to have significant disease burden based on last sputum culture, Repeat sputum from 9/29 still positive -Quite physically deconditioned to discharge home alone safely without assistance,, also has cognitive, memory deficits, discussed with case management, social work apparently SNF is not an option because of his active pulmonary TB  -Discharge planning challenging at this time -Recheck AFB today on 10/6  Acute kidney injury. On chronic kidney disease stage III Likely from poor p.o. intake. On admission serum creatinine noted to be 1.77 -Now stable in the 1.3 range  Dysphagia Evaluated by speech therapy. MBS significant for evidence of moderate oropharyngeal dysphagia with sensorimotor deficit and hypomotility of the pharynx. Patient deemed a high aspiration risk and placed on a dysphagia 1 diet, subsequently improved and transition to regular diet -Speech therapy following  Cognitive, memory deficits -Patient keeps forgetting and does not recall the fact that he is active pulmonary TB -TSH normal a month ago, follow-up B12  Peripheral neuropathy Concern this  could be related to RIPE therapy. Started on B6.  Symptoms resolved for now.  Left atrial mobile mass Echocardiogram on 05/16/2019 showed a possible left atrial mobile mass. Outpatient cardiology follow-up.  Non-sustained V-tach Resolved. Hypokalemia and magnesium likely contributed. Biweekly labs.  Hypokalemia/Hypomagnesemia Potassium normalized  Iron deficiency anemia -Continue iron supplementation No bleeding reported.  Diabetes mellitus, type 2, controlled without any complication. -Hemoglobin A1c 6.3 on 05/16/2019. Blood sugars are well controlled. Continue SSI  Moderate protein calorie malnutrition -RD consult appreciated, continue supplements as tolerated  Insomnia. -Trazodone nightly  DVT prophylaxis: Lovenox subq Code Status: Full Family Communication: no family not at bedside Disposition Plan: Uncertain at this time  Consultants:   ID  Procedures:     Antimicrobials: Anti-infectives (From admission, onward)   Start     Dose/Rate Route Frequency Ordered Stop   06/03/19 1000  ethambutol (MYAMBUTOL) tablet 1,200 mg  Status:  Discontinued     1,200 mg Oral Daily 06/02/19 1735 06/27/19 1339   06/03/19 1000  isoniazid (NYDRAZID) tablet 300 mg     300 mg Oral Daily 06/02/19 1735     06/03/19 1000  pyrazinamide tablet 1,500 mg     1,500 mg Oral Daily 06/02/19 1735     06/03/19 1000  rifampin (RIFADIN) capsule 600 mg     600 mg Oral Daily 06/02/19 1735     05/24/19 1000  pyrazinamide tablet 1,500 mg  Status:  Discontinued     1,500 mg Oral Daily 05/23/19 1626 06/02/19 1555   05/24/19 1000  ethambutol (MYAMBUTOL) tablet 1,200 mg  Status:  Discontinued     1,200 mg Oral Daily 05/23/19 1626 06/02/19 1555   05/19/19 1600  isoniazid (NYDRAZID) tablet 300 mg  Status:  Discontinued  300 mg Oral Daily 05/19/19 1428 06/02/19 1555   05/19/19 1600  rifampin (RIFADIN) capsule 600 mg  Status:  Discontinued     600 mg Oral Daily 05/19/19 1428 06/02/19 1555    05/19/19 1600  pyrazinamide tablet 1,000 mg  Status:  Discontinued     1,000 mg Oral Daily 05/19/19 1428 05/23/19 1626   05/19/19 1600  ethambutol (MYAMBUTOL) tablet 800 mg  Status:  Discontinued     800 mg Oral Daily 05/19/19 1428 05/23/19 1626   05/18/19 0000  vancomycin (VANCOCIN) IVPB 1000 mg/200 mL premix  Status:  Discontinued     1,000 mg 200 mL/hr over 60 Minutes Intravenous Every 36 hours 05/16/19 1219 05/17/19 1058   05/16/19 1130  vancomycin (VANCOCIN) 1,250 mg in sodium chloride 0.9 % 250 mL IVPB     1,250 mg 166.7 mL/hr over 90 Minutes Intravenous  Once 05/16/19 1100 05/16/19 1404   05/16/19 1000  ampicillin-sulbactam (UNASYN) 1.5 g in sodium chloride 0.9 % 100 mL IVPB     1.5 g 200 mL/hr over 30 Minutes Intravenous Every 6 hours 05/16/19 0952 05/23/19 2359      Subjective: -Feeling better overall, continuing to cough up thick productive sputum, breathing continues to improve, starting to ambulate in the room with his walker  Objective: Vitals:   07/10/19 0615 07/10/19 1044 07/10/19 2100 07/11/19 0523  BP: 117/75 124/69 (!) 141/72 139/70  Pulse: (!) 106 (!) 101 94 86  Resp:  18 16 16   Temp:  98.4 F (36.9 C) 98.6 F (37 C) 98.5 F (36.9 C)  TempSrc:  Oral Oral Oral  SpO2: 94% 96% 98% 93%  Weight:    61 kg  Height:        Intake/Output Summary (Last 24 hours) at 07/11/2019 1333 Last data filed at 07/11/2019 1100 Gross per 24 hour  Intake 480 ml  Output 825 ml  Net -345 ml   Filed Weights   07/07/19 0500 07/08/19 0531 07/11/19 0523  Weight: 63.7 kg 64 kg 61 kg    Examination: Gen: Elderly male, sitting up in bed awake alert oriented x2, memory and mild cognitive deficits noted,  HEENT: PERRLA, Neck supple, no JVD Lungs: Scattered rhonchi in both upper lung fields CVS: RRR,No Gallops,Rubs or new Murmurs Abd: soft, Non tender, non distended, BS present Extremities: No edema Skin: no new rashes   Data Reviewed: I have personally reviewed following labs  and imaging studies  CBC: Recent Labs  Lab 07/10/19 0412  WBC 7.3  HGB 12.1*  HCT 39.6  MCV 85.3  PLT 247   Basic Metabolic Panel: Recent Labs  Lab 07/10/19 0412  NA 134*  K 3.8  CL 100  CO2 26  GLUCOSE 89  BUN 34*  CREATININE 1.37*  CALCIUM 9.4   GFR: Estimated Creatinine Clearance: 42.1 mL/min (A) (by C-G formula based on SCr of 1.37 mg/dL (H)). Liver Function Tests: Recent Labs  Lab 07/05/19 0427  AST 19  ALT 11  ALKPHOS 74  BILITOT 0.4  PROT 6.9  ALBUMIN 3.1*   No results for input(s): LIPASE, AMYLASE in the last 168 hours. No results for input(s): AMMONIA in the last 168 hours. Coagulation Profile: No results for input(s): INR, PROTIME in the last 168 hours. Cardiac Enzymes: No results for input(s): CKTOTAL, CKMB, CKMBINDEX, TROPONINI in the last 168 hours. BNP (last 3 results) No results for input(s): PROBNP in the last 8760 hours. HbA1C: No results for input(s): HGBA1C in the last 72 hours. CBG:  Recent Labs  Lab 07/07/19 0538 07/08/19 0527 07/09/19 0826 07/10/19 0754 07/11/19 0522  GLUCAP 74 114* 82 154* 89   Lipid Profile: No results for input(s): CHOL, HDL, LDLCALC, TRIG, CHOLHDL, LDLDIRECT in the last 72 hours. Thyroid Function Tests: No results for input(s): TSH, T4TOTAL, FREET4, T3FREE, THYROIDAB in the last 72 hours. Anemia Panel: No results for input(s): VITAMINB12, FOLATE, FERRITIN, TIBC, IRON, RETICCTPCT in the last 72 hours. Sepsis Labs: No results for input(s): PROCALCITON, LATICACIDVEN in the last 168 hours.  Recent Results (from the past 240 hour(s))  Acid Fast Smear (AFB)     Status: Abnormal   Collection Time: 07/04/19 11:15 AM   Specimen: Sputum  Result Value Ref Range Status   AFB Specimen Processing Concentration  Final   Acid Fast Smear Positive (A)  Final    Comment: (NOTE) 3+, 4-36 acid-fast bacilli per field at 400X magnification, fluorescent smear Reported to Katherina Mires. at 1709 07-05-19 [facility]EM Faxed to [phone]  Performed At: [facility] [address] [place], [place] [id number] Jolene Schimke MD LG:[id number]    Source (AFB) SPUTUM  Final    Comment: Performed at [facility], [address] [place], [place] [address]     Radiology Studies: No results found.  Scheduled Meds: . buPROPion  150 mg Oral Daily  . clotrimazole   Topical BID  . enoxaparin (LOVENOX) injection  40 mg Subcutaneous Q24H  . famotidine  20 mg Oral Daily  . feeding supplement (ENSURE ENLIVE)  237 mL Oral Q24H  . ferrous sulfate  325 mg Oral BID WC  . isoniazid  300 mg Oral Daily   And  . vitamin B-6  50 mg Oral Daily  . loratadine  10 mg Oral Q breakfast  . mouth rinse  15 mL Mouth Rinse BID  . mometasone-formoterol  2 puff Inhalation BID  . multivitamin with minerals  1 tablet Oral Daily  . pravastatin  20 mg Oral q1800  . pyrazinamide  1,500 mg Oral Daily  . rifampin  600 mg Oral Daily  . thiamine  100 mg Oral Daily   Continuous Infusions: . sodium chloride Stopped (05/23/19 0250)     LOS: 57 days   Zannie Cove, MD [facility]  07/11/2019, 1:33 PM

## 2019-07-11 NOTE — Plan of Care (Signed)
  Problem: Clinical Measurements: Goal: Will remain free from infection Outcome: Progressing Goal: Respiratory complications will improve Outcome: Progressing Goal: Cardiovascular complication will be avoided Outcome: Progressing   Problem: Activity: Goal: Risk for activity intolerance will decrease Outcome: Progressing   Problem: Safety: Goal: Ability to remain free from injury will improve Outcome: Progressing   Problem: Skin Integrity: Goal: Risk for impaired skin integrity will decrease Outcome: Progressing   

## 2019-07-12 DIAGNOSIS — N183 Chronic kidney disease, stage 3 unspecified: Secondary | ICD-10-CM

## 2019-07-12 DIAGNOSIS — N1831 Chronic kidney disease, stage 3a: Secondary | ICD-10-CM

## 2019-07-12 LAB — HEPATIC FUNCTION PANEL
ALT: 9 U/L (ref 0–44)
AST: 18 U/L (ref 15–41)
Albumin: 2.8 g/dL — ABNORMAL LOW (ref 3.5–5.0)
Alkaline Phosphatase: 66 U/L (ref 38–126)
Bilirubin, Direct: 0.1 mg/dL (ref 0.0–0.2)
Indirect Bilirubin: 0.3 mg/dL (ref 0.3–0.9)
Total Bilirubin: 0.4 mg/dL (ref 0.3–1.2)
Total Protein: 6.4 g/dL — ABNORMAL LOW (ref 6.5–8.1)

## 2019-07-12 NOTE — Progress Notes (Addendum)
PROGRESS NOTE    Clarence Maldonado  IRS:[id number] DOB: 1945/10/30 DOA: 05/15/2019 PCP: [name], DO   Brief Narrative:  Is a 73 year old Caucasian male with a past medical history significant for but not limited to history of CVA, COPD, CKD stage III, tobacco abuse, diabetes mellitus type 2, hypertension as well as other comorbidities who presented with weakness and frequent falls and was found to have severe pulmonary tuberculosis.  Infectious diseases was consulted and he was started on treatment he is clinically improving on anti-TB treatment.  Hospitalization has been complicated by his general debility, cognitive deficits, as well as public health concerns given his acute pulmonary tuberculosis.  Last AFB from 929 was still positive for MTB.  Repeat was ordered yesterday but however never sent down we will send down today.  PT OT recommended SNF but is not an option given his active pulmonary tuberculosis.  Currently he is improving and he is on day 34 of treatment.  Infectious disease is intermittently following based on 07/07/2019.  He still has a productive cough.  Assessment & Plan:   Principal Problem:   Pulmonary tuberculosis with cavitation Active Problems:   Essential hypertension   CKD (chronic kidney disease) stage 3, GFR 30-59 ml/min (HCC)   Hyperlipidemia   Tobacco use disorder   Controlled type 2 diabetes mellitus with stage 3 chronic kidney disease, without long-term current use of insulin (HCC)   Weight loss   Hyperlipidemia associated with type 2 diabetes mellitus (HCC)   Anemia   Moderate protein-calorie malnutrition (HCC)  Severe cavitary pulmonary TB -Infectious Diseases is following intermittently  -Continues to make progress on current regimen of rifampin isoniazid and ethambutol, day 15 rifampin, pyrazinamide, INH and day 34 of treatment -Noted to have significant disease burden based on last sputum culture, Repeat sputum from 9/29 still positive -Quite  physically deconditioned to discharge home alone safely without assistance,, also has cognitive, memory deficits, discussed with case management, social work apparently SNF is not an option because of his active pulmonary TB and needs to be smear negative -Continue with treatment per ID recommendations with isoniazid 300 mg p.o. daily along with pyridoxine 50 mg p.o. daily, pyrazinamide 1500 mg p.o. daily, rifampin 6 mg p.o. daily -Continue with inhalers with Dulera 2 puffs IH twice daily, as well as Atrovent 2 puffs IH every 6 as needed for shortness of breath as well as Xopenex inhaler 2 puffs IH every 8 PRN for wheezing -C/w In-house PT/OT Evaluate and Treat  -Discharge planning challenging at this time -Recheck AFB  on 10/6 still pending to be sent down currently  Acute Kidney Injury on Chronic Kidney Disease stage III Likely from poor p.o. intake. On admission serum creatinine noted to be 1.77 -Now stable and BUN/Cr is 34/1.37 -Avoid Nephrotoxic Medications, Contrast Dyes, and Hypotension -Repeat CMP in AM   Dysphagia -Evaluated by speech therapy.  -MBS significant for evidence of moderate oropharyngeal dysphagia with sensorimotor deficit and hypomotility of the pharynx.  -Patient deemed a high aspiration risk and placed on a dysphagia 1 diet, subsequently improved and transitioned to regular diet -Speech therapy following and appreciate further recommendations   Cognitive and Memory deficits -Patient keeps forgetting and does not recall the fact that he is active pulmonary TB -TSH normal a month ago, follow-up B12 was 457 this AM  Peripheral Neuropathy -Concern this could be related to RIPE therapy. Started on B6.  -Symptoms resolved for now and will need to continue to Monitor carefully  Left Atrial Mobile Mass -Echocardiogram on 05/16/2019 showed a possible left atrial mobile mass. -Outpatient cardiology follow-up warranted   Non-sustained V-tach -Resolved.  -Hypokalemia and magnesium likely contributed. -C/w Biweekly labs.  Hypokalemia/Hypomagnesemia -Potassium normalized at 3.8 -Mag Level was not checked this AM -Continue to Monitor and Trend Biweekly labs and repeat on 07/15/2019  Iron Deficiency Anemia -Continue iron supplementation with Ferrous Sulfate 325 mg po BID -Continue to Monitor for S/Sx of Bleeding; Currently No overt bleeding reported -Patient's Hgb/Hct is now 12.1/39.6 -Continue to Monitor and Repeat CBC in AM   Diabetes Mellitus Type 2, controlled without any complication. -Hemoglobin A1c 6.3 on 05/16/2019. -Blood sugars are well controlled as CBG's ranging from 74-154 -Not on SSI anymore  Moderate Protein Calorie Malnutrition -RD consult appreciated, continue supplements as tolerated with Ensure Enlive 237 mL po q24h  Insomnia -C/w Trazodone 50 mg qHS nightly  Hyponatremia -Na+ is now 134 -Continue to Monitor and Trend -Repeat CMP in AM   HLD -C/w Pravastatin 20 mg po qHS  DVT prophylaxis: Enoxaparin 40 mg sq q24h Code Status: FULL CODE Family Communication: None Disposition Plan: Unclear at this time as PT/OT recommending SNF vs. [place] but is not an option given that patient has active pulmonary TB and he cannot currently go home because he is unsafe discharge to his trailer at this time   Consultants: Infectious Diseases     Procedures: None   Antimicrobials:  Anti-infectives (From admission, onward)   Start     Dose/Rate Route Frequency Ordered Stop   06/03/19 1000  ethambutol (MYAMBUTOL) tablet 1,200 mg  Status:  Discontinued     1,200 mg Oral Daily 06/02/19 1735 06/27/19 1339   06/03/19 1000  isoniazid (NYDRAZID) tablet 300 mg     300 mg Oral Daily 06/02/19 1735     06/03/19 1000  pyrazinamide tablet 1,500 mg     1,500 mg Oral Daily 06/02/19 1735     06/03/19 1000  rifampin (RIFADIN) capsule 600 mg     600 mg Oral Daily 06/02/19 1735     05/24/19 1000  pyrazinamide tablet 1,500 mg   Status:  Discontinued     1,500 mg Oral Daily 05/23/19 1626 06/02/19 1555   05/24/19 1000  ethambutol (MYAMBUTOL) tablet 1,200 mg  Status:  Discontinued     1,200 mg Oral Daily 05/23/19 1626 06/02/19 1555   05/19/19 1600  isoniazid (NYDRAZID) tablet 300 mg  Status:  Discontinued     300 mg Oral Daily 05/19/19 1428 06/02/19 1555   05/19/19 1600  rifampin (RIFADIN) capsule 600 mg  Status:  Discontinued     600 mg Oral Daily 05/19/19 1428 06/02/19 1555   05/19/19 1600  pyrazinamide tablet 1,000 mg  Status:  Discontinued     1,000 mg Oral Daily 05/19/19 1428 05/23/19 1626   05/19/19 1600  ethambutol (MYAMBUTOL) tablet 800 mg  Status:  Discontinued     800 mg Oral Daily 05/19/19 1428 05/23/19 1626   05/18/19 0000  vancomycin (VANCOCIN) IVPB 1000 mg/200 mL premix  Status:  Discontinued     1,000 mg 200 mL/hr over 60 Minutes Intravenous Every 36 hours 05/16/19 1219 05/17/19 1058   05/16/19 1130  vancomycin (VANCOCIN) 1,250 mg in sodium chloride 0.9 % 250 mL IVPB     1,250 mg 166.7 mL/hr over 90 Minutes Intravenous  Once 05/16/19 1100 05/16/19 1404   05/16/19 1000  ampicillin-sulbactam (UNASYN) 1.5 g in sodium chloride 0.9 % 100 mL IVPB  1.5 g 200 mL/hr over 30 Minutes Intravenous Every 6 hours 05/16/19 4008 05/23/19 2359     Subjective: Seen examined at bedside and states that he is going okay.  States that his sleeping pill helps with sleep at night.  No nausea or vomiting.  Still coughing up some sputum is white.  No other concerns or complaints at this time.  Objective: Vitals:   07/11/19 0523 07/11/19 1500 07/11/19 2043 07/12/19 0504  BP: 139/70 115/84 (!) 144/81 127/68  Pulse: 86 89 92 85  Resp: 16 18 16 19   Temp: 98.5 F (36.9 C) 98.6 F (37 C) 97.9 F (36.6 C) 97.8 F (36.6 C)  TempSrc: Oral Oral Oral Oral  SpO2: 93% 94% 93% 93%  Weight: 61 kg     Height:        Intake/Output Summary (Last 24 hours) at 07/12/2019 1201 Last data filed at 07/12/2019 1116 Gross per 24 hour   Intake 840 ml  Output 2000 ml  Net -1160 ml   Filed Weights   07/07/19 0500 07/08/19 0531 07/11/19 0523  Weight: 63.7 kg 64 kg 61 kg   Examination: Physical Exam:  Constitutional: WN/WD Caucasian male currently in NAD and appears calm  Eyes: Lids and conjunctivae normal, sclerae anicteric  ENMT: External Ears, Nose appear normal. Grossly normal hearing. Neck: Appears normal, supple, no cervical masses, normal ROM, no appreciable thyromegaly; no JVD Respiratory: Diminished to auscultation bilaterally, no wheezing, rales, rhonchi or crackles. Normal respiratory effort and patient is not tachypenic. No accessory muscle use.  Cardiovascular: RRR, no murmurs / rubs / gallops. S1 and S2 auscultated. No extremity edema.  Abdomen: Soft, non-tender, Slightly distended. Bowel sounds positive x4.  GU: Deferred. Musculoskeletal: No clubbing / cyanosis of digits/nails. No joint deformity upper and lower extremities Skin: No rashes, lesions, ulcers on a limited skin evaluation. No induration; Warm and dry.  Neurologic: CN 2-12 grossly intact with no focal deficits. Romberg sign and cerebellar reflexes not assessed.  Psychiatric: Normal judgment and insight. Alert and oriented x 3. Slightly Anxious mood and appropriate affect.   Data Reviewed: I have personally reviewed following labs and imaging studies  CBC: Recent Labs  Lab 07/10/19 0412  WBC 7.3  HGB 12.1*  HCT 39.6  MCV 85.3  PLT 247   Basic Metabolic Panel: Recent Labs  Lab 07/10/19 0412  NA 134*  K 3.8  CL 100  CO2 26  GLUCOSE 89  BUN 34*  CREATININE 1.37*  CALCIUM 9.4   GFR: Estimated Creatinine Clearance: 42.1 mL/min (A) (by C-G formula based on SCr of 1.37 mg/dL (H)). Liver Function Tests: Recent Labs  Lab 07/12/19 0431  AST 18  ALT 9  ALKPHOS 66  BILITOT 0.4  PROT 6.4*  ALBUMIN 2.8*   No results for input(s): LIPASE, AMYLASE in the last 168 hours. No results for input(s): AMMONIA in the last 168 hours.  Coagulation Profile: No results for input(s): INR, PROTIME in the last 168 hours. Cardiac Enzymes: No results for input(s): CKTOTAL, CKMB, CKMBINDEX, TROPONINI in the last 168 hours. BNP (last 3 results) No results for input(s): PROBNP in the last 8760 hours. HbA1C: No results for input(s): HGBA1C in the last 72 hours. CBG: Recent Labs  Lab 07/07/19 0538 07/08/19 0527 07/09/19 0826 07/10/19 0754 07/11/19 0522  GLUCAP 74 114* 82 154* 89   Lipid Profile: No results for input(s): CHOL, HDL, LDLCALC, TRIG, CHOLHDL, LDLDIRECT in the last 72 hours. Thyroid Function Tests: No results for input(s): TSH, T4TOTAL,  FREET4, T3FREE, THYROIDAB in the last 72 hours. Anemia Panel: Recent Labs    07/11/19 1422  VITAMINB12 457   Sepsis Labs: No results for input(s): PROCALCITON, LATICACIDVEN in the last 168 hours.  Recent Results (from the past 240 hour(s))  Acid Fast Smear (AFB)     Status: Abnormal   Collection Time: 07/04/19 11:15 AM   Specimen: Sputum  Result Value Ref Range Status   AFB Specimen Processing Concentration  Final   Acid Fast Smear Positive (A)  Final    Comment: (NOTE) 3+, 4-36 acid-fast bacilli per field at 400X magnification, fluorescent smear Reported to Katherina MiresJay S. at 1709 07-05-19 CEM Faxed to [phone] Performed At: [facility] [address] [place], [place] [id number] Jolene SchimkeNagendra Sanjai MD NW:[phone]    Source (AFB) SPUTUM  Final    Comment: Performed at [facility], [address] [place], [place] [address]    Radiology Studies: No results found.  Scheduled Meds: . buPROPion  150 mg Oral Daily  . clotrimazole   Topical BID  . enoxaparin (LOVENOX) injection  40 mg Subcutaneous Q24H  . famotidine  20 mg Oral Daily  . feeding supplement (ENSURE ENLIVE)  237 mL Oral Q24H  . ferrous sulfate  325 mg Oral BID WC  . isoniazid  300 mg Oral Daily   And  . vitamin B-6  50 mg Oral Daily  . loratadine  10 mg Oral Q breakfast  .  mouth rinse  15 mL Mouth Rinse BID  . mometasone-formoterol  2 puff Inhalation BID  . multivitamin with minerals  1 tablet Oral Daily  . pravastatin  20 mg Oral q1800  . pyrazinamide  1,500 mg Oral Daily  . rifampin  600 mg Oral Daily  . thiamine  100 mg Oral Daily   Continuous Infusions: . sodium chloride Stopped (05/23/19 0250)    LOS: 58 days   Merlene Laughtermair Latif Masiel Gentzler, DO [facility] PAGER is on AMION  If 7PM-7AM, please contact night-coverage [email or web address] Password [email or web address] 07/12/2019, 12:01 PM

## 2019-07-12 NOTE — Progress Notes (Signed)
Physical Therapy Treatment Patient Details Name: Clarence Maldonado MRN: [id number] DOB: Mar 25, 1946 Today's Date: 07/12/2019    History of Present Illness 73 y.o. male with medical history significant of cardiomegaly, stroke, COPD,   CKD stage III, tobacco abuse, DM 2, HTN who presented to the emergency room on 8/10 with progressive weakness over the last 3 months to the point where he was falling over. He has lost approximately 43 pounds in the past 2 months. Pt found to have Pulmonary tuberculosis with cavitation    PT Comments    Pt ambulated 40' +90' with RW with seated rest break 2* 2/4 dyspnea, SaO2 87% on room air walking, HR 128.   Follow Up Recommendations  Home health PT;Supervision/Assistance - 24 hour;SNF     Equipment Recommendations  Rolling walker with 5" wheels    Recommendations for Other Services       Precautions / Restrictions Precautions Precautions: Fall Precaution Comments: monitor O2 Restrictions Weight Bearing Restrictions: No    Mobility  Bed Mobility               General bed mobility comments: up in bathroom  Transfers Overall transfer level: Needs assistance Equipment used: Rolling walker (2 wheeled) Transfers: Sit to/from Stand Sit to Stand: Modified independent (Device/Increase time)         General transfer comment: used grab bar in bathroom to rise from commode  Ambulation/Gait Ambulation/Gait assistance: Min guard;Supervision Gait Distance (Feet): 90 Feet(90' + 40' with seated rest break 2* dyspnea) Assistive device: Rolling walker (2 wheeled) Gait Pattern/deviations: Step-through pattern;Decreased stride length;Trunk flexed Gait velocity: decr   General Gait Details: supervision to Min guard for safety, sats 87-90% on RA and dyspnea 2/4. Pt required seated rest breaks x2 minutes to recover dyspnea. HR 128 immediately after walking   Stairs             Wheelchair Mobility    Modified Rankin (Stroke Patients  Only)       Balance Overall balance assessment: Needs assistance Sitting-balance support: Feet supported Sitting balance-Leahy Scale: Good     Standing balance support: Bilateral upper extremity supported;No upper extremity supported Standing balance-Leahy Scale: Fair Standing balance comment: BUE support for dynamic standing tasks                            Cognition Arousal/Alertness: Awake/alert Behavior During Therapy: WFL for tasks assessed/performed Overall Cognitive Status: Within Functional Limits for tasks assessed                                        Exercises      General Comments        Pertinent Vitals/Pain Pain Assessment: No/denies pain    Home Living                      Prior Function            PT Goals (current goals can now be found in the care plan section) Acute Rehab PT Goals Patient Stated Goal: I want to get stronger so I can go home to my dog crystal PT Goal Formulation: With patient Time For Goal Achievement: 07/17/19 Potential to Achieve Goals: Good    Frequency    Min 3X/week      PT Plan Current plan remains appropriate    Co-evaluation  AM-PAC PT "6 Clicks" Mobility   Outcome Measure  Help needed turning from your back to your side while in a flat bed without using bedrails?: None Help needed moving from lying on your back to sitting on the side of a flat bed without using bedrails?: None Help needed moving to and from a bed to a chair (including a wheelchair)?: A Little Help needed standing up from a chair using your arms (e.g., wheelchair or bedside chair)?: A Little Help needed to walk in hospital room?: A Little Help needed climbing 3-5 steps with a railing? : A Little 6 Click Score: 20    End of Session Equipment Utilized During Treatment: Gait belt Activity Tolerance: Patient tolerated treatment well Patient left: with call bell/phone within reach;in  bed Nurse Communication: Mobility status PT Visit Diagnosis: Muscle weakness (generalized) (M62.81);Difficulty in walking, not elsewhere classified (R26.2)     Time: 1419-1440 PT Time Calculation (min) (ACUTE ONLY): 21 min  Charges:  $Gait Training: 8-22 mins                     Blondell Reveal Kistler PT 07/12/2019  Acute Rehabilitation Services Pager [phone] Office [phone]

## 2019-07-12 NOTE — Progress Notes (Signed)
MEWS turned to yellow d/t increased pulse.  Patient exerting and up with PT at the time.  No acute change noted. Will continue to monitor.

## 2019-07-13 LAB — AFB ORGANISM ID BY DNA PROBE
M avium complex: NEGATIVE
M tuberculosis complex: POSITIVE — AB

## 2019-07-13 LAB — ACID FAST SMEAR (AFB, MYCOBACTERIA): Acid Fast Smear: POSITIVE

## 2019-07-13 LAB — ACID FAST CULTURE WITH REFLEXED SENSITIVITIES (MYCOBACTERIA): Acid Fast Culture: POSITIVE — AB

## 2019-07-13 NOTE — Progress Notes (Signed)
CRITICAL VALUE ALERT  Critical Value:  AFB positive  Date & Time Notied:  07/13/19 1545  Provider Notified: Dr Alfredia Ferguson  Orders Received/Actions taken: MD made aware, will consult to ID

## 2019-07-13 NOTE — Progress Notes (Addendum)
PROGRESS NOTE    Clarence LaosRobert M Maldonado  UJW:[id number] DOB: Jun 27, 1946 DOA: 05/15/2019 PCP: Kermit Baloeed, Tiffany L, DO   Brief Narrative:  Is a 73 year old Caucasian male with a past medical history significant for but not limited to history of CVA, COPD, CKD stage III, tobacco abuse, diabetes mellitus type 2, hypertension as well as other comorbidities who presented with weakness and frequent falls and was found to have severe pulmonary tuberculosis.  Infectious diseases was consulted and he was started on treatment he is clinically improving on anti-TB treatment.  Hospitalization has been complicated by his general debility, cognitive deficits, as well as public health concerns given his acute pulmonary tuberculosis.  Last AFB from 929 was still positive for MTB.  Repeat was ordered yesterday but however never sent down we will send down today.  PT OT recommended SNF but is not an option given his active pulmonary tuberculosis.  Currently he is improving and he is on day 35 of treatment.  Infectious disease is intermittently following and last saw the patient on 07/07/2019.  He still has a productive cough but states it is improved.  Assessment & Plan:   Principal Problem:   Pulmonary tuberculosis with cavitation Active Problems:   Essential hypertension   CKD (chronic kidney disease) stage 3, GFR 30-59 ml/min (HCC)   Hyperlipidemia   Tobacco use disorder   Controlled type 2 diabetes mellitus with stage 3 chronic kidney disease, without long-term current use of insulin (HCC)   Weight loss   Hyperlipidemia associated with type 2 diabetes mellitus (HCC)   Anemia   Moderate protein-calorie malnutrition (HCC)  Severe cavitary pulmonary TB -Infectious Diseases is following intermittently  -Continues to make progress on current regimen of rifampin isoniazid and ethambutol, day 16 rifampin, pyrazinamide, INH and day 35 of treatment -Noted to have significant disease burden based on last sputum culture,  Repeat sputum from 9/29 still positive -Quite physically deconditioned to discharge home alone safely without assistance,, also has cognitive, memory deficits, discussed with case management, social work apparently SNF is not an option because of his active pulmonary TB and needs to be smear negative -Continue with treatment per ID recommendations with isoniazid 300 mg p.o. daily along with pyridoxine 50 mg p.o. daily, pyrazinamide 1500 mg p.o. daily, rifampin 6 mg p.o. daily -Continue with inhalers with Dulera 2 puffs IH twice daily, as well as Atrovent 2 puffs IH every 6 as needed for shortness of breath as well as Xopenex inhaler 2 puffs IH every 8 PRN for wheezing -C/w In-house PT/OT Evaluate and Treat  -Discharge planning challenging at this time -Recheck AFB ordered on 10/6 was sent down on 10/7 and currently is pending to result  Acute Kidney Injury on Chronic Kidney Disease stage III -Likely from poor p.o. intake. On admission serum creatinine noted to be 1.77 -Now stable and BUN/Cr is 34/1.37 on 07/10/2019  -Avoid Nephrotoxic Medications, Contrast Dyes, and Hypotension -Repeat CMP intermittently and will repeat Labs on 07/15/2019   Dysphagia -Evaluated by speech therapy.  -MBS significant for evidence of moderate oropharyngeal dysphagia with sensorimotor deficit and hypomotility of the pharynx.  -Patient deemed a high aspiration risk and placed on a dysphagia 1 diet, subsequently improved and transitioned to regular diet -Speech therapy following and appreciate further recommendations   Cognitive and Memory deficits -Patient keeps forgetting and does not recall the fact that he is active pulmonary TB -TSH normal a month ago, follow-up B12 was 457 on 07/11/2019   Peripheral Neuropathy -Concern this could  be related to RIPE therapy. Started on B6.  -Symptoms resolved for now and will need to continue to Monitor carefully   Left Atrial Mobile Mass -Echocardiogram on 05/16/2019  showed a possible left atrial mobile mass. -Outpatient Cardiology follow-up warranted   Non-sustained V-tach -Resolved. -Hypokalemia and magnesium likely contributed. -C/w Biweekly labs and next Labs to be done 07/15/2019   Hypokalemia/Hypomagnesemia -Potassium normalized at 3.8 on 07/10/2019  -Mag Level was not checked this AM -Continue to Monitor and Trend Biweekly labs and repeat on 07/15/2019  Iron Deficiency Anemia -Continue iron supplementation with Ferrous Sulfate 325 mg po BID -Continue to Monitor for S/Sx of Bleeding; Currently No overt bleeding reported -Patient's Hgb/Hct is now 12.1/39.6 (last checked on 07/10/2019) -Continue to Monitor and Repeat CBC in AM   Diabetes Mellitus Type 2, controlled without any complication. -Hemoglobin A1c 6.3 on 05/16/2019. -Blood sugars are well controlled as CBG's ranging from 74-154 -Not on SSI anymore  Moderate Protein Calorie Malnutrition -RD consult appreciated, continue supplements as tolerated with Ensure Enlive 237 mL po q24h  Insomnia -C/w Trazodone 50 mg qHS nightly  Hyponatremia -Na+ is now 134 on 07/10/2019  -Continue to Monitor and Trend -Repeat CMP in AM   HLD -C/w Pravastatin 20 mg po qHS  DVT prophylaxis: Enoxaparin 40 mg sq q24h Code Status: FULL CODE Family Communication: None Disposition Plan: Unclear at this time as PT/OT recommending SNF vs. Home Health but is not an option given that patient has active pulmonary TB and he cannot currently go home because he is unsafe discharge to his trailer at this time   Consultants: Infectious Diseases     Procedures: None   Antimicrobials:  Anti-infectives (From admission, onward)   Start     Dose/Rate Route Frequency Ordered Stop   06/03/19 1000  ethambutol (MYAMBUTOL) tablet 1,200 mg  Status:  Discontinued     1,200 mg Oral Daily 06/02/19 1735 06/27/19 1339   06/03/19 1000  isoniazid (NYDRAZID) tablet 300 mg     300 mg Oral Daily 06/02/19 1735      06/03/19 1000  pyrazinamide tablet 1,500 mg     1,500 mg Oral Daily 06/02/19 1735     06/03/19 1000  rifampin (RIFADIN) capsule 600 mg     600 mg Oral Daily 06/02/19 1735     05/24/19 1000  pyrazinamide tablet 1,500 mg  Status:  Discontinued     1,500 mg Oral Daily 05/23/19 1626 06/02/19 1555   05/24/19 1000  ethambutol (MYAMBUTOL) tablet 1,200 mg  Status:  Discontinued     1,200 mg Oral Daily 05/23/19 1626 06/02/19 1555   05/19/19 1600  isoniazid (NYDRAZID) tablet 300 mg  Status:  Discontinued     300 mg Oral Daily 05/19/19 1428 06/02/19 1555   05/19/19 1600  rifampin (RIFADIN) capsule 600 mg  Status:  Discontinued     600 mg Oral Daily 05/19/19 1428 06/02/19 1555   05/19/19 1600  pyrazinamide tablet 1,000 mg  Status:  Discontinued     1,000 mg Oral Daily 05/19/19 1428 05/23/19 1626   05/19/19 1600  ethambutol (MYAMBUTOL) tablet 800 mg  Status:  Discontinued     800 mg Oral Daily 05/19/19 1428 05/23/19 1626   05/18/19 0000  vancomycin (VANCOCIN) IVPB 1000 mg/200 mL premix  Status:  Discontinued     1,000 mg 200 mL/hr over 60 Minutes Intravenous Every 36 hours 05/16/19 1219 05/17/19 1058   05/16/19 1130  vancomycin (VANCOCIN) 1,250 mg in sodium chloride 0.9 % 250  mL IVPB     1,250 mg 166.7 mL/hr over 90 Minutes Intravenous  Once 05/16/19 1100 05/16/19 1404   05/16/19 1000  ampicillin-sulbactam (UNASYN) 1.5 g in sodium chloride 0.9 % 100 mL IVPB     1.5 g 200 mL/hr over 30 Minutes Intravenous Every 6 hours 05/16/19 2355 05/23/19 2359     Subjective: Seen examined at bedside and states that he is doing okay.  Was also frustrated this morning as he kept getting interrupted when he was trying to eat.  Denies any chest pain, lightheadedness or dizziness.  Still coughing up some sputum but states it is not as much.  No other concerns complaints at this time.  Objective: Vitals:   07/12/19 2237 07/13/19 0703 07/13/19 0802 07/13/19 1202  BP: (!) 148/86 115/63  (!) 141/84  Pulse: 86 80  (!)  101  Resp: 18 18  19   Temp: (!) 97.5 F (36.4 C) 98 F (36.7 C)  98 F (36.7 C)  TempSrc: Oral Oral  Oral  SpO2: 93% 95% 94% 95%  Weight:      Height:        Intake/Output Summary (Last 24 hours) at 07/13/2019 1251 Last data filed at 07/13/2019 1000 Gross per 24 hour  Intake -  Output 1400 ml  Net -1400 ml   Filed Weights   07/07/19 0500 07/08/19 0531 07/11/19 0523  Weight: 63.7 kg 64 kg 61 kg   Examination: Physical Exam:  Constitutional: WN/WD Caucasian male in NAD and appears calm  Eyes: Lids and conjunctivae normal, sclerae anicteric  ENMT: External Ears, Nose appear normal. Grossly normal hearing.  Neck: Appears normal, supple, no cervical masses, normal ROM, no appreciable thyromegaly; no JVD Respiratory: Diminished to auscultation bilaterally, no wheezing, rales, rhonchi or crackles. Normal respiratory effort and patient is not tachypenic. No accessory muscle use. Unlabored breathing  Cardiovascular: RRR, no murmurs / rubs / gallops. S1 and S2 auscultated. Trace extremity edema.  Abdomen: Soft, non-tender. No masses palpated. No appreciable hepatosplenomegaly. Bowel sounds positive.  GU: Deferred. Musculoskeletal: No clubbing / cyanosis of digits/nails. No joint deformity upper and lower extremities.  Skin: No rashes, lesions, ulcers on a limited skin evaluation. No induration; Warm and dry.  Neurologic: CN 2-12 grossly intact with no focal deficits. Romberg sign and cerebellar reflexes not assessed.  Psychiatric: Normal judgment and insight. Alert and oriented x 3. Normal mood and appropriate affect.   Data Reviewed: I have personally reviewed following labs and imaging studies  CBC: Recent Labs  Lab 07/10/19 0412  WBC 7.3  HGB 12.1*  HCT 39.6  MCV 85.3  PLT 732   Basic Metabolic Panel: Recent Labs  Lab 07/10/19 0412  NA 134*  K 3.8  CL 100  CO2 26  GLUCOSE 89  BUN 34*  CREATININE 1.37*  CALCIUM 9.4   GFR: Estimated Creatinine Clearance: 42.1  mL/min (A) (by C-G formula based on SCr of 1.37 mg/dL (H)). Liver Function Tests: Recent Labs  Lab 07/12/19 0431  AST 18  ALT 9  ALKPHOS 66  BILITOT 0.4  PROT 6.4*  ALBUMIN 2.8*   No results for input(s): LIPASE, AMYLASE in the last 168 hours. No results for input(s): AMMONIA in the last 168 hours. Coagulation Profile: No results for input(s): INR, PROTIME in the last 168 hours. Cardiac Enzymes: No results for input(s): CKTOTAL, CKMB, CKMBINDEX, TROPONINI in the last 168 hours. BNP (last 3 results) No results for input(s): PROBNP in the last 8760 hours. HbA1C: No results for  input(s): HGBA1C in the last 72 hours. CBG: Recent Labs  Lab 07/07/19 0538 07/08/19 0527 07/09/19 0826 07/10/19 0754 07/11/19 0522  GLUCAP 74 114* 82 154* 89   Lipid Profile: No results for input(s): CHOL, HDL, LDLCALC, TRIG, CHOLHDL, LDLDIRECT in the last 72 hours. Thyroid Function Tests: No results for input(s): TSH, T4TOTAL, FREET4, T3FREE, THYROIDAB in the last 72 hours. Anemia Panel: Recent Labs    07/11/19 1422  VITAMINB12 457   Sepsis Labs: No results for input(s): PROCALCITON, LATICACIDVEN in the last 168 hours.  Recent Results (from the past 240 hour(s))  Acid Fast Smear (AFB)     Status: Abnormal   Collection Time: 07/04/19 11:15 AM   Specimen: Sputum  Result Value Ref Range Status   AFB Specimen Processing Concentration  Final   Acid Fast Smear Positive (A)  Final    Comment: (NOTE) 3+, 4-36 acid-fast bacilli per field at 400X magnification, fluorescent smear Reported to Katherina Mires. at 1709 07-05-19 [facility] Faxed to [phone] Performed At: [facility] [address] [place], [place] [id number] Jolene Schimke MD AF:[id number]    Source (AFB) SPUTUM  Final    Comment: Performed at [facility], [address] [place], [place] [address]    Radiology Studies: No results found.  Scheduled Meds: . buPROPion  150 mg Oral Daily  . clotrimazole    Topical BID  . enoxaparin (LOVENOX) injection  40 mg Subcutaneous Q24H  . famotidine  20 mg Oral Daily  . feeding supplement (ENSURE ENLIVE)  237 mL Oral Q24H  . ferrous sulfate  325 mg Oral BID WC  . isoniazid  300 mg Oral Daily   And  . vitamin B-6  50 mg Oral Daily  . loratadine  10 mg Oral Q breakfast  . mouth rinse  15 mL Mouth Rinse BID  . mometasone-formoterol  2 puff Inhalation BID  . multivitamin with minerals  1 tablet Oral Daily  . pravastatin  20 mg Oral q1800  . pyrazinamide  1,500 mg Oral Daily  . rifampin  600 mg Oral Daily  . thiamine  100 mg Oral Daily   Continuous Infusions: . sodium chloride Stopped (05/23/19 0250)    LOS: 59 days   Merlene Laughter, DO [facility] PAGER is on AMION  If 7PM-7AM, please contact night-coverage [email or web address] Password [email or web address] 07/13/2019, 12:51 PM

## 2019-07-14 NOTE — Progress Notes (Signed)
Occupational Therapy Treatment Patient Details Name: Clarence Maldonado MRN: [id number] DOB: 1946/06/28 Today's Date: 07/14/2019    History of present illness 73 y.o. male with medical history significant of cardiomegaly, stroke, COPD,   CKD stage III, tobacco abuse, DM 2, HTN who presented to the emergency room on 8/10 with progressive weakness over the last 3 months to the point where he was falling over. He has lost approximately 43 pounds in the past 2 months. Pt found to have Pulmonary tuberculosis with cavitation   OT comments  Limited tx today due to fatique.  Pt was eating when I first arrived and wanted to continue.  Returned later with written HEP for level 2 theraband (horizontal ABD and FF).  Pt has level one tied to bedrails for biceps:  Will upgrade this to level 2 next visit.     Follow Up Recommendations  Home health OT;Supervision/Assistance - 24 hour    Equipment Recommendations  3 in 1 bedside commode    Recommendations for Other Services      Precautions / Restrictions Precautions Precautions: Fall Precaution Comments: monitor O2 Restrictions Weight Bearing Restrictions: No       Mobility Bed Mobility Overal bed mobility: Modified Independent                Transfers Overall transfer level: Needs assistance Equipment used: Rolling walker (2 wheeled) Transfers: Sit to/from Stand Sit to Stand: Supervision         General transfer comment: increased time. cues for safety, technique.    Balance                                           ADL either performed or assessed with clinical judgement   ADL                                               Vision       Perception     Praxis      Cognition Arousal/Alertness: Awake/alert Behavior During Therapy: WFL for tasks assessed/performed Overall Cognitive Status: Within Functional Limits for tasks assessed                                          Exercises General Exercises - Upper Extremity Shoulder Flexion: (handouts given) Shoulder Horizontal ABduction: Strengthening;Both;10 reps;Theraband Theraband Level (Shoulder Horizontal Abduction): Level 2 (Red)    Shoulder Instructions       General Comments      Pertinent Vitals/ Pain       Pain Assessment: No/denies pain  Home Living                                          Prior Functioning/Environment              Frequency  Min 2X/week        Progress Toward Goals  OT Goals(current goals can now be found in the care plan section)  Progress towards OT goals: Progressing toward goals     Plan  Co-evaluation                 AM-PAC OT "6 Clicks" Daily Activity     Outcome Measure   Help from another person eating meals?: None Help from another person taking care of personal grooming?: A Little Help from another person toileting, which includes using toliet, bedpan, or urinal?: A Little Help from another person bathing (including washing, rinsing, drying)?: A Little Help from another person to put on and taking off regular upper body clothing?: A Little Help from another person to put on and taking off regular lower body clothing?: A Little 6 Click Score: 19    End of Session    OT Visit Diagnosis: Muscle weakness (generalized) (M62.81);Unsteadiness on feet (R26.81)   Activity Tolerance Patient limited by fatigue   Patient Left in bed;with call bell/phone within reach;with bed alarm set   Nurse Communication          Time: 5035-4656 OT Time Calculation (min): 10 min  Charges: OT General Charges $OT Visit: 1 Visit OT Treatments $Therapeutic Exercise: 8-22 mins  [name], OTR/L Acute Rehabilitation Services [phone] [facility] pager [phone] office 07/14/2019   Jayant Kriz 07/14/2019, 3:59 PM

## 2019-07-14 NOTE — Progress Notes (Signed)
Physical Therapy Treatment Patient Details Name: Clarence Maldonado MRN: [id number] DOB: 1945-10-16 Today's Date: 07/14/2019    History of Present Illness 73 y.o. male with medical history significant of cardiomegaly, stroke, COPD,   CKD stage III, tobacco abuse, DM 2, HTN who presented to the emergency room on 8/10 with progressive weakness over the last 3 months to the point where he was falling over. He has lost approximately 43 pounds in the past 2 months. Pt found to have Pulmonary tuberculosis with cavitation    PT Comments    Pt continues to participate well. SpO2: 95% on RA at rest, 89% on RA with activity. Pt ambulated and performed standing exercises on today. He continues to fatigue fairly easily/quickly. Recommend ambulation in room with nursing assistance in addition to PT visits. Will continue to follow.     Follow Up Recommendations  Home health PT;Supervision/Assistance - 24 hour;SNF     Equipment Recommendations  Rolling walker with 5" wheels    Recommendations for Other Services       Precautions / Restrictions Precautions Precautions: Fall Precaution Comments: monitor O2 Restrictions Weight Bearing Restrictions: No    Mobility  Bed Mobility Overal bed mobility: Modified Independent                Transfers Overall transfer level: Needs assistance Equipment used: Rolling walker (2 wheeled) Transfers: Sit to/from Stand Sit to Stand: Supervision         General transfer comment: increased time. cues for safety, technique.  Ambulation/Gait Ambulation/Gait assistance: Min guard;Supervision Gait Distance (Feet): 75 Feet Assistive device: Rolling walker (2 wheeled)       General Gait Details: walked 3 laps on RA, SpO2 89% on RA, dyspnea 2/4, HR 128 bpm.   Stairs             Wheelchair Mobility    Modified Rankin (Stroke Patients Only)       Balance                                            Cognition  Arousal/Alertness: Awake/alert Behavior During Therapy: WFL for tasks assessed/performed Overall Cognitive Status: Within Functional Limits for tasks assessed                                        Exercises General Exercises - Lower Extremity Hip Flexion/Marching: AROM;Left;Right;10 reps;Standing Heel Raises: AROM;Both;10 reps;Standing Other Exercises Other Exercises: Knee flexion, standing, R and L, 10 reps, active    General Comments        Pertinent Vitals/Pain Pain Assessment: No/denies pain    Home Living                      Prior Function            PT Goals (current goals can now be found in the care plan section) Progress towards PT goals: Progressing toward goals    Frequency    Min 3X/week      PT Plan Current plan remains appropriate    Co-evaluation              AM-PAC PT "6 Clicks" Mobility   Outcome Measure  Help needed turning from your back to your side while in a flat bed without using  bedrails?: None Help needed moving from lying on your back to sitting on the side of a flat bed without using bedrails?: None Help needed moving to and from a bed to a chair (including a wheelchair)?: A Little Help needed standing up from a chair using your arms (e.g., wheelchair or bedside chair)?: A Little Help needed to walk in hospital room?: A Little Help needed climbing 3-5 steps with a railing? : A Little 6 Click Score: 20    End of Session Equipment Utilized During Treatment: Gait belt Activity Tolerance: Patient tolerated treatment well Patient left: in bed;with call bell/phone within reach   PT Visit Diagnosis: Muscle weakness (generalized) (M62.81);Difficulty in walking, not elsewhere classified (R26.2)     Time: 5625-6389 PT Time Calculation (min) (ACUTE ONLY): 29 min  Charges:  $Gait Training: 8-22 mins $Therapeutic Exercise: 8-22 mins                       Weston Anna, PT Acute Rehabilitation  Services Pager: [phone] Office: [phone]

## 2019-07-14 NOTE — Progress Notes (Signed)
PROGRESS NOTE    Clarence Maldonado  ZDG:[id number] DOB: 13-Jun-1946 DOA: 05/15/2019 PCP: Gayland Curry, DO   Brief Narrative:  Is a 73 year old Caucasian male with a past medical history significant for but not limited to history of CVA, COPD, CKD stage III, tobacco abuse, diabetes mellitus type 2, hypertension as well as other comorbidities who presented with weakness and frequent falls and was found to have severe pulmonary tuberculosis.  Infectious diseases was consulted and he was started on treatment he is clinically improving on anti-TB treatment.  Hospitalization has been complicated by his general debility, cognitive deficits, as well as public health concerns given his acute pulmonary tuberculosis.  Last AFB from 07/04/19 was still positive for MTB.  Repeat ordered on 07/12/19 and was still positive for MTB.  PT OT recommended SNF but is not an option given his active pulmonary tuberculosis.  Currently he is improving and he is on day 36 of treatment.  Infectious disease is intermittently following and last saw the patient on 07/07/2019.  He still has a productive cough but states it is improved.  Assessment & Plan:   Principal Problem:   Pulmonary tuberculosis with cavitation Active Problems:   Essential hypertension   CKD (chronic kidney disease) stage 3, GFR 30-59 ml/min (HCC)   Hyperlipidemia   Tobacco use disorder   Controlled type 2 diabetes mellitus with stage 3 chronic kidney disease, without long-term current use of insulin (HCC)   Weight loss   Hyperlipidemia associated with type 2 diabetes mellitus (HCC)   Anemia   Moderate protein-calorie malnutrition (HCC)  Severe cavitary pulmonary TB -Infectious Diseases is following intermittently  -Continues to make progress on current regimen of rifampin isoniazid and ethambutol, day 17 rifampin, pyrazinamide, INH and day 36 of treatment -Noted to have significant disease burden based on last sputum culture, Repeat sputum from 9/29  still positive -Quite physically deconditioned to discharge home alone safely without assistance,, also has cognitive, memory deficits, discussed with case management, social work apparently SNF is not an option because of his active pulmonary TB and needs to be smear negative -Continue with treatment per ID recommendations with isoniazid 300 mg p.o. daily along with pyridoxine 50 mg p.o. daily, pyrazinamide 1500 mg p.o. daily, rifampin 6 mg p.o. daily -Continue with inhalers with Dulera 2 puffs IH twice daily, as well as Atrovent 2 puffs IH every 6 as needed for shortness of breath as well as Xopenex inhaler 2 puffs IH every 8 PRN for wheezing -C/w In-house PT/OT Evaluate and Treat  -Discharge planning challenging at this time -Recheck AFB Smear ordered on 10/6 was sent down on 10/7 and currently is Positive with Cx still pending -I spoke with ID yesterday and Dr. Linus Salmons recommends no changes to Regimen  Acute Kidney Injury on Chronic Kidney Disease stage III -Likely from poor p.o. intake. -On admission serum creatinine noted to be 1.77 -Now stable and BUN/Cr is 34/1.37 on 07/10/2019  -Avoid Nephrotoxic Medications, Contrast Dyes, and Hypotension -Repeat CMP intermittently and will repeat Labs in AM   Dysphagia -Evaluated by speech therapy.  -MBS significant for evidence of moderate oropharyngeal dysphagia with sensorimotor deficit and hypomotility of the pharynx.  -Patient deemed a high aspiration risk and placed on a dysphagia 1 diet, subsequently improved and transitioned to regular diet -Speech therapy following and appreciate further recommendations   Cognitive and Memory deficits -Patient keeps forgetting and does not recall the fact that he is active pulmonary TB -TSH normal a month ago, follow-up  B12 was 457 on 07/11/2019   Peripheral Neuropathy -Concern this could be related to RIPE therapy. Started on B6.  -Symptoms resolved for now and will need to continue to Monitor  carefully   Left Atrial Mobile Mass -Echocardiogram on 05/16/2019 showed a possible left atrial mobile mass. -Outpatient Cardiology follow-up warranted   Non-sustained V-tach -Resolved. -Hypokalemia and magnesium likely contributed. -C/w Biweekly labs and next Labs to be done 07/15/2019   Hypokalemia/Hypomagnesemia -Potassium normalized at 3.8 on 07/10/2019  -Mag Level was not checked this AM -Continue to Monitor and Trend Biweekly labs  -Will repeat in AM   Iron Deficiency Anemia -Continue iron supplementation with Ferrous Sulfate 325 mg po BID -Continue to Monitor for S/Sx of Bleeding; Currently No overt bleeding reported -Patient's Hgb/Hct is now 12.1/39.6 (last checked on 07/10/2019) -Continue to Monitor and Repeat CBC in AM   Diabetes Mellitus Type 2, controlled without any complication. -Hemoglobin A1c 6.3 on 05/16/2019. -Blood sugars are well controlled as CBG's ranging from 74-154 -Not on SSI anymore  Moderate Protein Calorie Malnutrition -RD consult appreciated, continue supplements as tolerated with Ensure Enlive 237 mL po q24h  Insomnia -C/w Trazodone 50 mg qHS nightly  Hyponatremia -Na+ is now 134 on 07/10/2019  -Continue to Monitor and Trend -Repeat CMP in AM   HLD -C/w Pravastatin 20 mg po qHS  DVT prophylaxis: Enoxaparin 40 mg sq q24h Code Status: FULL CODE Family Communication: None Disposition Plan: Unclear at this time as PT/OT recommending SNF vs. Home Health but is not an option given that patient has active pulmonary TB and he cannot currently go home because he is unsafe discharge to his trailer at this time   Consultants: Infectious Diseases     Procedures: None   Antimicrobials:  Anti-infectives (From admission, onward)   Start     Dose/Rate Route Frequency Ordered Stop   06/03/19 1000  ethambutol (MYAMBUTOL) tablet 1,200 mg  Status:  Discontinued     1,200 mg Oral Daily 06/02/19 1735 06/27/19 1339   06/03/19 1000  isoniazid  (NYDRAZID) tablet 300 mg     300 mg Oral Daily 06/02/19 1735     06/03/19 1000  pyrazinamide tablet 1,500 mg     1,500 mg Oral Daily 06/02/19 1735     06/03/19 1000  rifampin (RIFADIN) capsule 600 mg     600 mg Oral Daily 06/02/19 1735     05/24/19 1000  pyrazinamide tablet 1,500 mg  Status:  Discontinued     1,500 mg Oral Daily 05/23/19 1626 06/02/19 1555   05/24/19 1000  ethambutol (MYAMBUTOL) tablet 1,200 mg  Status:  Discontinued     1,200 mg Oral Daily 05/23/19 1626 06/02/19 1555   05/19/19 1600  isoniazid (NYDRAZID) tablet 300 mg  Status:  Discontinued     300 mg Oral Daily 05/19/19 1428 06/02/19 1555   05/19/19 1600  rifampin (RIFADIN) capsule 600 mg  Status:  Discontinued     600 mg Oral Daily 05/19/19 1428 06/02/19 1555   05/19/19 1600  pyrazinamide tablet 1,000 mg  Status:  Discontinued     1,000 mg Oral Daily 05/19/19 1428 05/23/19 1626   05/19/19 1600  ethambutol (MYAMBUTOL) tablet 800 mg  Status:  Discontinued     800 mg Oral Daily 05/19/19 1428 05/23/19 1626   05/18/19 0000  vancomycin (VANCOCIN) IVPB 1000 mg/200 mL premix  Status:  Discontinued     1,000 mg 200 mL/hr over 60 Minutes Intravenous Every 36 hours 05/16/19 1219 05/17/19 1058  05/16/19 1130  vancomycin (VANCOCIN) 1,250 mg in sodium chloride 0.9 % 250 mL IVPB     1,250 mg 166.7 mL/hr over 90 Minutes Intravenous  Once 05/16/19 1100 05/16/19 1404   05/16/19 1000  ampicillin-sulbactam (UNASYN) 1.5 g in sodium chloride 0.9 % 100 mL IVPB     1.5 g 200 mL/hr over 30 Minutes Intravenous Every 6 hours 05/16/19 0952 05/23/19 2359     Subjective: Seen examined at bedside and states did not sleep as well last night.  Thinks he is getting a bit stronger and states he actually gained some weight now.  No nausea or vomiting.  No chest pain, lightheadedness or dizziness.  Objective: Vitals:   07/13/19 2223 07/14/19 0614 07/14/19 0853 07/14/19 1444  BP: 136/70 (!) 116/58  135/82  Pulse: 90 86  97  Resp: 19   18  Temp:  98.1 F (36.7 C) 98.1 F (36.7 C)  98.4 F (36.9 C)  TempSrc: Oral Oral  Oral  SpO2: 93% 94% 92% 94%  Weight:      Height:        Intake/Output Summary (Last 24 hours) at 07/14/2019 1622 Last data filed at 07/14/2019 1300 Gross per 24 hour  Intake 480 ml  Output 1040 ml  Net -560 ml   Filed Weights   07/07/19 0500 07/08/19 0531 07/11/19 0523  Weight: 63.7 kg 64 kg 61 kg   Examination: Physical Exam:  Constitutional: WN/WD Caucasian male in NAD and appears calm laying in bed wanting to sleep Eyes: Lids and conjunctivae normal, sclerae anicteric  ENMT: External Ears, Nose appear normal. Grossly normal hearing. Mucous membranes are moist. Neck: Diminished normal, supple, no cervical masses, normal ROM, no appreciable thyromegaly. No JVD Respiratory: Diminished to auscultation bilaterally, no wheezing, rales, rhonchi or crackles. Normal respiratory effort and patient is not tachypenic. No accessory muscle use.  Cardiovascular: RRR, no murmurs / rubs / gallops. S1 and S2 auscultated. Mild extremity edema.  Abdomen: Soft, non-tender, non-distended. Bowel sounds positive x4.  GU: Deferred. Musculoskeletal: No clubbing / cyanosis of digits/nails. No joint deformity upper and lower extremities. Skin: No rashes, lesions, ulcers on a limited skin evaluation. No induration; Warm and dry.  Neurologic: CN 2-12 grossly intact with no focal deficits. Romberg sign and cerebellar reflexes not assessed.  Psychiatric: Normal judgment and insight. Alert and oriented x 3. Normal mood and appropriate affect.   Data Reviewed: I have personally reviewed following labs and imaging studies  CBC: Recent Labs  Lab 07/10/19 0412  WBC 7.3  HGB 12.1*  HCT 39.6  MCV 85.3  PLT 247   Basic Metabolic Panel: Recent Labs  Lab 07/10/19 0412  NA 134*  K 3.8  CL 100  CO2 26  GLUCOSE 89  BUN 34*  CREATININE 1.37*  CALCIUM 9.4   GFR: Estimated Creatinine Clearance: 42.1 mL/min (A) (by C-G formula  based on SCr of 1.37 mg/dL (H)). Liver Function Tests: Recent Labs  Lab 07/12/19 0431  AST 18  ALT 9  ALKPHOS 66  BILITOT 0.4  PROT 6.4*  ALBUMIN 2.8*   No results for input(s): LIPASE, AMYLASE in the last 168 hours. No results for input(s): AMMONIA in the last 168 hours. Coagulation Profile: No results for input(s): INR, PROTIME in the last 168 hours. Cardiac Enzymes: No results for input(s): CKTOTAL, CKMB, CKMBINDEX, TROPONINI in the last 168 hours. BNP (last 3 results) No results for input(s): PROBNP in the last 8760 hours. HbA1C: No results for input(s): HGBA1C in the  last 72 hours. CBG: Recent Labs  Lab 07/08/19 0527 07/09/19 0826 07/10/19 0754 07/11/19 0522  GLUCAP 114* 82 154* 89   Lipid Profile: No results for input(s): CHOL, HDL, LDLCALC, TRIG, CHOLHDL, LDLDIRECT in the last 72 hours. Thyroid Function Tests: No results for input(s): TSH, T4TOTAL, FREET4, T3FREE, THYROIDAB in the last 72 hours. Anemia Panel: No results for input(s): VITAMINB12, FOLATE, FERRITIN, TIBC, IRON, RETICCTPCT in the last 72 hours. Sepsis Labs: No results for input(s): PROCALCITON, LATICACIDVEN in the last 168 hours.  Recent Results (from the past 240 hour(s))  Acid Fast Smear (AFB)     Status: None   Collection Time: 07/12/19 12:43 PM   Specimen: Sputum  Result Value Ref Range Status   AFB Specimen Processing Concentration  Final   Acid Fast Smear Positive  Final    Comment: CRITICAL RESULT CALLED TO, READ BACK BY AND VERIFIED WITH: Sherrye Payor RN [id number] 07/13/19 A BROWNING (NOTE) 3+, 4-36 acid-fast bacilli per field at 400X magnification, fluorescent smear REPORTED RESULTS ON 07-13-19 AT 1526. FAX CONFORMATION TO [phone] Performed At: [facility] [address] [place], [place] [id number] Jolene Schimke MD XB:[id number] Performed at [facility], [address], [place], [place] [address]    Source (AFB) SPUTUM  Final    Comment: Performed at [facility], [address], [place], [place] [address]    Radiology Studies: No results found.  Scheduled Meds: . buPROPion  150 mg Oral Daily  . clotrimazole   Topical BID  . enoxaparin (LOVENOX) injection  40 mg Subcutaneous Q24H  . famotidine  20 mg Oral Daily  . feeding supplement (ENSURE ENLIVE)  237 mL Oral Q24H  . ferrous sulfate  325 mg Oral BID WC  . isoniazid  300 mg Oral Daily   And  . vitamin B-6  50 mg Oral Daily  . loratadine  10 mg Oral Q breakfast  . mouth rinse  15 mL Mouth Rinse BID  . mometasone-formoterol  2 puff Inhalation BID  . multivitamin with minerals  1 tablet Oral Daily  . pravastatin  20 mg Oral q1800  . pyrazinamide  1,500 mg Oral Daily  . rifampin  600 mg Oral Daily  . thiamine  100 mg Oral Daily   Continuous Infusions: . sodium chloride Stopped (05/23/19 0250)    LOS: 60 days   Merlene Laughter, DO [facility] PAGER is on AMION  If 7PM-7AM, please contact night-coverage [email or web address] Password [email or web address] 07/14/2019, 4:22 PM

## 2019-07-15 ENCOUNTER — Inpatient Hospital Stay (HOSPITAL_COMMUNITY): Payer: Medicare Other

## 2019-07-15 LAB — CBC WITH DIFFERENTIAL/PLATELET
Abs Immature Granulocytes: 0.05 10*3/uL (ref 0.00–0.07)
Basophils Absolute: 0.1 10*3/uL (ref 0.0–0.1)
Basophils Relative: 1 %
Eosinophils Absolute: 0.3 10*3/uL (ref 0.0–0.5)
Eosinophils Relative: 4 %
HCT: 41.2 % (ref 39.0–52.0)
Hemoglobin: 12.9 g/dL — ABNORMAL LOW (ref 13.0–17.0)
Immature Granulocytes: 1 %
Lymphocytes Relative: 18 %
Lymphs Abs: 1.2 10*3/uL (ref 0.7–4.0)
MCH: 26.8 pg (ref 26.0–34.0)
MCHC: 31.3 g/dL (ref 30.0–36.0)
MCV: 85.7 fL (ref 80.0–100.0)
Monocytes Absolute: 0.8 10*3/uL (ref 0.1–1.0)
Monocytes Relative: 13 %
Neutro Abs: 4.1 10*3/uL (ref 1.7–7.7)
Neutrophils Relative %: 63 %
Platelets: 230 10*3/uL (ref 150–400)
RBC: 4.81 MIL/uL (ref 4.22–5.81)
RDW: 19.9 % — ABNORMAL HIGH (ref 11.5–15.5)
WBC: 6.5 10*3/uL (ref 4.0–10.5)
nRBC: 0 % (ref 0.0–0.2)

## 2019-07-15 LAB — COMPREHENSIVE METABOLIC PANEL
ALT: 12 U/L (ref 0–44)
AST: 20 U/L (ref 15–41)
Albumin: 3.2 g/dL — ABNORMAL LOW (ref 3.5–5.0)
Alkaline Phosphatase: 68 U/L (ref 38–126)
Anion gap: 12 (ref 5–15)
BUN: 41 mg/dL — ABNORMAL HIGH (ref 8–23)
CO2: 22 mmol/L (ref 22–32)
Calcium: 9.7 mg/dL (ref 8.9–10.3)
Chloride: 100 mmol/L (ref 98–111)
Creatinine, Ser: 1.32 mg/dL — ABNORMAL HIGH (ref 0.61–1.24)
GFR calc Af Amer: >60 mL/min (ref >60)
GFR calc non Af Amer: 54 mL/min — ABNORMAL LOW (ref >60)
Glucose, Bld: 103 mg/dL — ABNORMAL HIGH (ref 70–99)
Potassium: 4.1 mmol/L (ref 3.5–5.1)
Sodium: 134 mmol/L — ABNORMAL LOW (ref 135–145)
Total Bilirubin: 0.8 mg/dL (ref 0.3–1.2)
Total Protein: 6.9 g/dL (ref 6.5–8.1)

## 2019-07-15 LAB — PHOSPHORUS: Phosphorus: 5 mg/dL — ABNORMAL HIGH (ref 2.5–4.6)

## 2019-07-15 LAB — MAGNESIUM: Magnesium: 2.3 mg/dL (ref 1.7–2.4)

## 2019-07-15 NOTE — Plan of Care (Signed)
  Problem: Clinical Measurements: Goal: Will remain free from infection Outcome: Progressing Goal: Diagnostic test results will improve Outcome: Progressing Goal: Respiratory complications will improve Outcome: Progressing Goal: Cardiovascular complication will be avoided Outcome: Progressing   Problem: Activity: Goal: Risk for activity intolerance will decrease Outcome: Progressing   Problem: Safety: Goal: Ability to remain free from injury will improve Outcome: Progressing   

## 2019-07-15 NOTE — Plan of Care (Signed)
reviewed

## 2019-07-15 NOTE — Progress Notes (Signed)
PROGRESS NOTE    Clarence Maldonado  CXK:[id number] DOB: 1946/05/11 DOA: 05/15/2019 PCP: [name], DO   Brief Narrative:  Is a 73 year old Caucasian male with a past medical history significant for but not limited to history of CVA, COPD, CKD stage III, tobacco abuse, diabetes mellitus type 2, hypertension as well as other comorbidities who presented with weakness and frequent falls and was found to have severe pulmonary tuberculosis.  Infectious diseases was consulted and he was started on treatment he is clinically improving on anti-TB treatment.  Hospitalization has been complicated by his general debility, cognitive deficits, as well as public health concerns given his acute pulmonary tuberculosis.  Last AFB from 07/04/19 was still positive for MTB.  Repeat ordered on 07/12/19 and was still positive for MTB.  PT OT recommended SNF but is not an option given his active pulmonary tuberculosis.  Currently he is improving and he is on day 37 of treatment.  Infectious disease is intermittently following and last saw the patient on 07/07/2019.  He still has a productive cough but states it is improved.  Assessment & Plan:   Principal Problem:   Pulmonary tuberculosis with cavitation Active Problems:   Essential hypertension   CKD (chronic kidney disease) stage 3, GFR 30-59 ml/min (HCC)   Hyperlipidemia   Tobacco use disorder   Controlled type 2 diabetes mellitus with stage 3 chronic kidney disease, without long-term current use of insulin (HCC)   Weight loss   Hyperlipidemia associated with type 2 diabetes mellitus (HCC)   Anemia   Moderate protein-calorie malnutrition (HCC)  Severe cavitary pulmonary TB -Infectious Diseases is following intermittently  -Continues to make progress on current regimen of rifampin isoniazid and ethambutol, day 18 rifampin, pyrazinamide, INH and day 37 of treatment -Noted to have significant disease burden based on last sputum culture, Repeat sputum from 9/29  still positive -Quite physically deconditioned to discharge home alone safely without assistance,, also has cognitive, memory deficits, discussed with case management, social work apparently SNF is not an option because of his active pulmonary TB and needs to be smear negative -Continue with treatment per ID recommendations with isoniazid 300 mg p.o. daily along with pyridoxine 50 mg p.o. daily, pyrazinamide 1500 mg p.o. daily, rifampin 6 mg p.o. daily -Continue with inhalers with Dulera 2 puffs IH twice daily, as well as Atrovent 2 puffs IH every 6 as needed for shortness of breath as well as Xopenex inhaler 2 puffs IH every 8 PRN for wheezing -C/w In-house PT/OT Evaluate and Treat  -Discharge planning challenging at this time -Recheck AFB Smear ordered on 10/6 was sent down on 10/7 and currently is Positive with Cx still pending -I spoke with ID the day before yesterday and Dr. Luciana Axe recommends no changes to Regimen -CXR this AM showed "Stable cardiac silhouette. Multifocal dense nodular airspace opacities in the LEFT upper lobe and LEFT lower lobe are not improved. Nodular opacities in the RIGHT upper lobe are also not improved. Bullous change in the RIGHT lower lobe noted" and overall impression was "No significant change in bilateral nodular airspace disease consistent with known cavitary tuberculosis."  Acute Kidney Injury on Chronic Kidney Disease stage III -Likely from poor p.o. intake. -On admission serum creatinine noted to be 1.77 -Now stable and BUN/Cr is 41/1.37 on 07/15/2019  -Avoid Nephrotoxic Medications, Contrast Dyes, and Hypotension -Repeat CMP intermittently and will repeat Labs in AM   Dysphagia -Evaluated by speech therapy.  -MBS significant for evidence of moderate oropharyngeal dysphagia  with sensorimotor deficit and hypomotility of the pharynx.  -Patient deemed a high aspiration risk and placed on a dysphagia 1 diet, subsequently improved and transitioned to regular  diet -Speech therapy following and appreciate further recommendations   Cognitive and Memory deficits -Patient keeps forgetting and does not recall the fact that he is active pulmonary TB -TSH normal a month ago, follow-up B12 was 457 on 07/11/2019   Peripheral Neuropathy -Concern this could be related to RIPE therapy. Started on B6.  -Symptoms resolved for now and will need to continue to Monitor carefully   Left Atrial Mobile Mass -Echocardiogram on 05/16/2019 showed a possible left atrial mobile mass. -Outpatient Cardiology follow-up warranted   Non-sustained V-tach -Resolved. -Hypokalemia and magnesium likely contributed. -C/w Biweekly labs and next Labs to be done 07/15/2019   Hypokalemia/Hypomagnesemia -Potassium normalized at 3.8 on 07/10/2019  -Mag Level was not checked this AM -Continue to Monitor and Trend Biweekly labs  -Will repeat in AM   Iron Deficiency Anemia -Continue iron supplementation with Ferrous Sulfate 325 mg po BID -Continue to Monitor for S/Sx of Bleeding; Currently No overt bleeding reported -Patient's Hgb/Hct is now 12.9/41.2 and was checked on 07/15/2019 -Continue to Monitor and Repeat CBC in AM   Diabetes Mellitus Type 2, controlled without any complication. -Hemoglobin A1c 6.3 on 05/16/2019. -Blood sugars are well controlled as CBG's ranging from 74-154 -Not on SSI anymore  Moderate Protein Calorie Malnutrition -RD consult appreciated, continue supplements as tolerated with Ensure Enlive 237 mL po q24h  Insomnia -C/w Trazodone 50 mg qHS nightly -Asking for Nyquil   Hyponatremia -Na+ is now 134 on 07/15/2019  -Continue to Monitor and Trend -Repeat CMP in AM   HLD -C/w Pravastatin 20 mg po qHS  DVT prophylaxis: Enoxaparin 40 mg sq q24h Code Status: FULL CODE Family Communication: None Disposition Plan: Unclear at this time as PT/OT recommending SNF vs. Home Health but is not an option given that patient has active pulmonary TB  and he cannot currently go home because he is unsafe discharge to his trailer at this time   Consultants: Infectious Diseases     Procedures: None   Antimicrobials:  Anti-infectives (From admission, onward)   Start     Dose/Rate Route Frequency Ordered Stop   06/03/19 1000  ethambutol (MYAMBUTOL) tablet 1,200 mg  Status:  Discontinued     1,200 mg Oral Daily 06/02/19 1735 06/27/19 1339   06/03/19 1000  isoniazid (NYDRAZID) tablet 300 mg     300 mg Oral Daily 06/02/19 1735     06/03/19 1000  pyrazinamide tablet 1,500 mg     1,500 mg Oral Daily 06/02/19 1735     06/03/19 1000  rifampin (RIFADIN) capsule 600 mg     600 mg Oral Daily 06/02/19 1735     05/24/19 1000  pyrazinamide tablet 1,500 mg  Status:  Discontinued     1,500 mg Oral Daily 05/23/19 1626 06/02/19 1555   05/24/19 1000  ethambutol (MYAMBUTOL) tablet 1,200 mg  Status:  Discontinued     1,200 mg Oral Daily 05/23/19 1626 06/02/19 1555   05/19/19 1600  isoniazid (NYDRAZID) tablet 300 mg  Status:  Discontinued     300 mg Oral Daily 05/19/19 1428 06/02/19 1555   05/19/19 1600  rifampin (RIFADIN) capsule 600 mg  Status:  Discontinued     600 mg Oral Daily 05/19/19 1428 06/02/19 1555   05/19/19 1600  pyrazinamide tablet 1,000 mg  Status:  Discontinued     1,000 mg  Oral Daily 05/19/19 1428 05/23/19 1626   05/19/19 1600  ethambutol (MYAMBUTOL) tablet 800 mg  Status:  Discontinued     800 mg Oral Daily 05/19/19 1428 05/23/19 1626   05/18/19 0000  vancomycin (VANCOCIN) IVPB 1000 mg/200 mL premix  Status:  Discontinued     1,000 mg 200 mL/hr over 60 Minutes Intravenous Every 36 hours 05/16/19 1219 05/17/19 1058   05/16/19 1130  vancomycin (VANCOCIN) 1,250 mg in sodium chloride 0.9 % 250 mL IVPB     1,250 mg 166.7 mL/hr over 90 Minutes Intravenous  Once 05/16/19 1100 05/16/19 1404   05/16/19 1000  ampicillin-sulbactam (UNASYN) 1.5 g in sodium chloride 0.9 % 100 mL IVPB     1.5 g 200 mL/hr over 30 Minutes Intravenous Every 6  hours 05/16/19 0952 05/23/19 2359     Subjective: Seen examined at bedside and states he still did not sleep well last night and is asking for NyQuil.  States that he has never been on CPAP.  Still coughing and still having a productive cough with some whitish sputum.  No nausea or vomiting.  No other concerns reported at this time.  Objective: Vitals:   07/14/19 2048 07/15/19 0455 07/15/19 0500 07/15/19 1153  BP: (!) 151/76 133/68  127/70  Pulse: 87 84  95  Resp: 18 16  18   Temp: 98.1 F (36.7 C) 98 F (36.7 C)  98.4 F (36.9 C)  TempSrc: Oral Oral  Oral  SpO2: 94% 96%  97%  Weight:   62 kg   Height:        Intake/Output Summary (Last 24 hours) at 07/15/2019 1425 Last data filed at 07/15/2019 1000 Gross per 24 hour  Intake 1560 ml  Output 1225 ml  Net 335 ml   Filed Weights   07/08/19 0531 07/11/19 0523 07/15/19 0500  Weight: 64 kg 61 kg 62 kg   Examination: Physical Exam:  Constitutional: WN/WD Caucasian male in NAD and appears calm  Eyes: Lids and conjunctivae normal, sclerae anicteric  ENMT: External Ears, Nose appear normal. Grossly normal hearing. Mucous membranes are moist. Neck: Appears normal, supple, no cervical masses, normal ROM, no appreciable thyromegaly; no JVD Respiratory: Diminished to auscultation bilaterally, no wheezing, rales, rhonchi or crackles. Normal respiratory effort and patient is not tachypenic. No accessory muscle use. Unlabored breathing but still has a semi-productive cough Cardiovascular: RRR, no murmurs / rubs / gallops. S1 and S2 auscultated. Trace extremity edema.  Abdomen: Soft, non-tender, non-distended. Bowel sounds positive.  GU: Deferred. Musculoskeletal: No clubbing / cyanosis of digits/nails. No joint deformity upper and lower extremities. Skin: No rashes, lesions, ulcers on a limited skin evaluation. No induration; Warm and dry.  Neurologic: CN 2-12 grossly intact with no focal deficits. Romberg sign and cerebellar reflexes not  assessed.  Psychiatric: Normal judgment and insight. Alert and oriented x 3. Pleasant mood and appropriate affect.   Data Reviewed: I have personally reviewed following labs and imaging studies  CBC: Recent Labs  Lab 07/10/19 0412 07/15/19 0520  WBC 7.3 6.5  NEUTROABS  --  4.1  HGB 12.1* 12.9*  HCT 39.6 41.2  MCV 85.3 85.7  PLT 247 230   Basic Metabolic Panel: Recent Labs  Lab 07/10/19 0412 07/15/19 0520  NA 134* 134*  K 3.8 4.1  CL 100 100  CO2 26 22  GLUCOSE 89 103*  BUN 34* 41*  CREATININE 1.37* 1.32*  CALCIUM 9.4 9.7  MG  --  2.3  PHOS  --  5.0*  GFR: Estimated Creatinine Clearance: 44.4 mL/min (A) (by C-G formula based on SCr of 1.32 mg/dL (H)). Liver Function Tests: Recent Labs  Lab 07/12/19 0431 07/15/19 0520  AST 18 20  ALT 9 12  ALKPHOS 66 68  BILITOT 0.4 0.8  PROT 6.4* 6.9  ALBUMIN 2.8* 3.2*   No results for input(s): LIPASE, AMYLASE in the last 168 hours. No results for input(s): AMMONIA in the last 168 hours. Coagulation Profile: No results for input(s): INR, PROTIME in the last 168 hours. Cardiac Enzymes: No results for input(s): CKTOTAL, CKMB, CKMBINDEX, TROPONINI in the last 168 hours. BNP (last 3 results) No results for input(s): PROBNP in the last 8760 hours. HbA1C: No results for input(s): HGBA1C in the last 72 hours. CBG: Recent Labs  Lab 07/09/19 0826 07/10/19 0754 07/11/19 0522  GLUCAP 82 154* 89   Lipid Profile: No results for input(s): CHOL, HDL, LDLCALC, TRIG, CHOLHDL, LDLDIRECT in the last 72 hours. Thyroid Function Tests: No results for input(s): TSH, T4TOTAL, FREET4, T3FREE, THYROIDAB in the last 72 hours. Anemia Panel: No results for input(s): VITAMINB12, FOLATE, FERRITIN, TIBC, IRON, RETICCTPCT in the last 72 hours. Sepsis Labs: No results for input(s): PROCALCITON, LATICACIDVEN in the last 168 hours.  Recent Results (from the past 240 hour(s))  Acid Fast Smear (AFB)     Status: None   Collection Time: 07/12/19  12:43 PM   Specimen: Sputum  Result Value Ref Range Status   AFB Specimen Processing Concentration  Final   Acid Fast Smear Positive  Final    Comment: CRITICAL RESULT CALLED TO, READ BACK BY AND VERIFIED WITH: Sherrye PayorM BULLINS RN [id number] 07/13/19 A BROWNING (NOTE) 3+, 4-36 acid-fast bacilli per field at 400X magnification, fluorescent smear REPORTED RESULTS ON 07-13-19 AT 1526. FAX CONFORMATION TO [phone] Performed At: [facility] [address] [place], [place] [id number] Jolene SchimkeNagendra Sanjai MD AO:[phone] Performed at [facility], [address] [place], [place] [address]    Source (AFB) SPUTUM  Final    Comment: Performed at [facility], [address] [place], [place] [address]    Radiology Studies: Dg Chest Port 1 View  Result Date: 07/15/2019 CLINICAL DATA:  Per order- SOB (shortness of breath). Cavitary tuberculosis identified. EXAM: PORTABLE CHEST 1 VIEW COMPARISON:  Radiograph 07/01/2019, CT 05/15/2019 FINDINGS: Stable cardiac silhouette. Multifocal dense nodular airspace opacities in the LEFT upper lobe and LEFT lower lobe are not improved. Nodular opacities in the RIGHT upper lobe are also not improved. Bullous change in the RIGHT lower lobe noted. IMPRESSION: No significant change in bilateral nodular airspace disease consistent with known cavitary tuberculosis. Electronically Signed   By: Genevive BiStewart  Edmunds M.D.   On: 07/15/2019 09:17    Scheduled Meds: . buPROPion  150 mg Oral Daily  . clotrimazole   Topical BID  . enoxaparin (LOVENOX) injection  40 mg Subcutaneous Q24H  . famotidine  20 mg Oral Daily  . feeding supplement (ENSURE ENLIVE)  237 mL Oral Q24H  . ferrous sulfate  325 mg Oral BID WC  . isoniazid  300 mg Oral Daily   And  . vitamin B-6  50 mg Oral Daily  . loratadine  10 mg Oral Q breakfast  . mouth rinse  15 mL Mouth Rinse BID  . mometasone-formoterol  2 puff Inhalation BID  . multivitamin with minerals  1 tablet Oral  Daily  . pravastatin  20 mg Oral q1800  . pyrazinamide  1,500 mg Oral Daily  . rifampin  600 mg Oral Daily  . thiamine  100 mg Oral Daily   Continuous Infusions: . sodium chloride Stopped (05/23/19 0250)    LOS: 79 days   Kerney Elbe, DO [facility] PAGER is on [place]  If 7PM-7AM, please contact night-coverage [email or web address] Password [email or web address] 07/15/2019, 2:25 PM

## 2019-07-16 LAB — GLUCOSE, CAPILLARY: Glucose-Capillary: 142 mg/dL — ABNORMAL HIGH (ref 70–99)

## 2019-07-16 NOTE — Plan of Care (Signed)
  Problem: Clinical Measurements: Goal: Will remain free from infection Outcome: Progressing Goal: Diagnostic test results will improve Outcome: Progressing Goal: Respiratory complications will improve Outcome: Progressing   Problem: Activity: Goal: Risk for activity intolerance will decrease Outcome: Progressing   Problem: Safety: Goal: Ability to remain free from injury will improve Outcome: Progressing   

## 2019-07-16 NOTE — Progress Notes (Signed)
PROGRESS NOTE    Clarence Maldonado  EOF:[id number] DOB: [birth year] DOA: 05/15/2019 PCP: [name], DO   Brief Narrative:  Is a 73 year old Caucasian male with a past medical history significant for but not limited to history of CVA, COPD, CKD stage III, tobacco abuse, diabetes mellitus type 2, hypertension as well as other comorbidities who presented with weakness and frequent falls and was found to have severe pulmonary tuberculosis.  Infectious diseases was consulted and he was started on treatment he is clinically improving on anti-TB treatment.  Hospitalization has been complicated by his general debility, cognitive deficits, as well as public health concerns given his acute pulmonary tuberculosis.  Last AFB from 07/04/19 was still positive for MTB.  Repeat ordered on 07/12/19 and was still positive for MTB.  PT OT recommended SNF but is not an option given his active pulmonary tuberculosis.  Currently he is improving and he is on day 38 of treatment.  Infectious disease is intermittently following and last saw the patient on 07/07/2019.  He still has a productive cough but states it is improved.   Assessment & Plan:   Principal Problem:   Pulmonary tuberculosis with cavitation Active Problems:   Essential hypertension   CKD (chronic kidney disease) stage 3, GFR 30-59 ml/min (HCC)   Hyperlipidemia   Tobacco use disorder   Controlled type 2 diabetes mellitus with stage 3 chronic kidney disease, without long-term current use of insulin (HCC)   Weight loss   Hyperlipidemia associated with type 2 diabetes mellitus (HCC)   Anemia   Moderate protein-calorie malnutrition (HCC)  Severe cavitary pulmonary TB -Infectious Diseases is following intermittently  -Continues to make progress on current regimen of rifampin isoniazid and ethambutol, day 19 rifampin, pyrazinamide, INH and day 38 of treatment -Noted to have significant disease burden based on last sputum culture, Repeat sputum from 9/29  still positive -Quite physically deconditioned to discharge home alone safely without assistance,, also has cognitive, memory deficits, discussed with case management, social work apparently SNF is not an option because of his active pulmonary TB and needs to be smear negative -Continue with treatment per ID recommendations with isoniazid 300 mg p.o. daily along with pyridoxine 50 mg p.o. daily, pyrazinamide 1500 mg p.o. daily, rifampin 6 mg p.o. daily -Continue with inhalers with Dulera 2 puffs IH twice daily, as well as Atrovent 2 puffs IH every 6 as needed for shortness of breath as well as Xopenex inhaler 2 puffs IH every 8 PRN for wheezing -C/w In-house PT/OT Evaluate and Treat  -Discharge planning challenging at this time -Recheck AFB Smear ordered on 10/6 was sent down on 10/7 and currently is Positive with Cx still pending -Dr. Luciana Axe recommends no changes to Regimen -CXR yesterday AM showed "Stable cardiac silhouette. Multifocal dense nodular airspace opacities in the LEFT upper lobe and LEFT lower lobe are not improved. Nodular opacities in the RIGHT upper lobe are also not improved. Bullous change in the RIGHT lower lobe noted" and overall impression was "No significant change in bilateral nodular airspace disease consistent with known cavitary tuberculosis." -Continue treatment per ID recommendation  Acute Kidney Injury on Chronic Kidney Disease stage III -Likely from poor p.o. intake. -On admission serum creatinine noted to be 1.77 -Now stable and BUN/Cr is 41/1.37 on 07/15/2019  -Avoid Nephrotoxic Medications, Contrast Dyes, and Hypotension -Repeat CMP intermittently and will repeat labs biweekly and next 1 to be drawn is on Wednesday, 07/19/1999  Dysphagia -Evaluated by speech therapy.  -MBS significant for  evidence of moderate oropharyngeal dysphagia with sensorimotor deficit and hypomotility of the pharynx.  -Patient deemed a high aspiration risk and placed on a dysphagia 1  diet, subsequently improved and transitioned to regular diet -Speech therapy following and appreciate further recommendations   Cognitive and Memory deficits -Patient keeps forgetting and does not recall the fact that he is active pulmonary TB -TSH normal a month ago, follow-up B12 was 457 on 07/11/2019   Peripheral Neuropathy -Concern this could be related to RIPE therapy. Started on B6.  -Symptoms resolved for now and will need to continue to Monitor carefully   Left Atrial Mobile Mass -Echocardiogram on 05/16/2019 showed a possible left atrial mobile mass. -Outpatient Cardiology follow-up warranted   Non-sustained V-tach -Resolved. -Hypokalemia and magnesium likely contributed. -C/w Biweekly labs and next Labs to be done 07/15/2019   Hypokalemia/Hypomagnesemia -Potassium normalized and was 4.1 on 07/15/2019  -Mag Level was 2.3 -Continue to Monitor and Trend Biweekly labs  -Will repeat on Wednesday 07/19/2019 AM   Iron Deficiency Anemia -Continue iron supplementation with Ferrous Sulfate 325 mg po BID -Continue to Monitor for S/Sx of Bleeding; Currently No overt bleeding reported -Patient's Hgb/Hct is now 12.9/41.2 and was checked on 07/15/2019 -Continue to Monitor and Repeat CBC on Wednesday 07/19/2019  Diabetes Mellitus Type 2, controlled without any complication. -Hemoglobin A1c 6.3 on 05/16/2019. -Blood sugars are well controlled as CBG's ranging from 89-142 -Not on SSI anymore  Moderate Protein Calorie Malnutrition -RD consult appreciated, continue supplements as tolerated with Ensure Enlive 237 mL po q24h  Insomnia -C/w Trazodone 50 mg qHS nightly -Asking for Nyquil   Hyponatremia -Na+ is now 134 on 07/15/2019  -Continue to Monitor and Trend -Repeat CMP intermittently   HLD -C/w Pravastatin 20 mg po qHS  DVT prophylaxis: Enoxaparin 40 mg sq q24h Code Status: FULL CODE Family Communication: None Disposition Plan: Unclear at this time as PT/OT  recommending SNF vs. Home Health but is not an option given that patient has active pulmonary TB and he cannot currently go home because he is unsafe discharge to his trailer at this time  Consultants: Infectious Diseases     Procedures: None   Antimicrobials:  Anti-infectives (From admission, onward)   Start     Dose/Rate Route Frequency Ordered Stop   06/03/19 1000  ethambutol (MYAMBUTOL) tablet 1,200 mg  Status:  Discontinued     1,200 mg Oral Daily 06/02/19 1735 06/27/19 1339   06/03/19 1000  isoniazid (NYDRAZID) tablet 300 mg     300 mg Oral Daily 06/02/19 1735     06/03/19 1000  pyrazinamide tablet 1,500 mg     1,500 mg Oral Daily 06/02/19 1735     06/03/19 1000  rifampin (RIFADIN) capsule 600 mg     600 mg Oral Daily 06/02/19 1735     05/24/19 1000  pyrazinamide tablet 1,500 mg  Status:  Discontinued     1,500 mg Oral Daily 05/23/19 1626 06/02/19 1555   05/24/19 1000  ethambutol (MYAMBUTOL) tablet 1,200 mg  Status:  Discontinued     1,200 mg Oral Daily 05/23/19 1626 06/02/19 1555   05/19/19 1600  isoniazid (NYDRAZID) tablet 300 mg  Status:  Discontinued     300 mg Oral Daily 05/19/19 1428 06/02/19 1555   05/19/19 1600  rifampin (RIFADIN) capsule 600 mg  Status:  Discontinued     600 mg Oral Daily 05/19/19 1428 06/02/19 1555   05/19/19 1600  pyrazinamide tablet 1,000 mg  Status:  Discontinued  1,000 mg Oral Daily 05/19/19 1428 05/23/19 1626   05/19/19 1600  ethambutol (MYAMBUTOL) tablet 800 mg  Status:  Discontinued     800 mg Oral Daily 05/19/19 1428 05/23/19 1626   05/18/19 0000  vancomycin (VANCOCIN) IVPB 1000 mg/200 mL premix  Status:  Discontinued     1,000 mg 200 mL/hr over 60 Minutes Intravenous Every 36 hours 05/16/19 1219 05/17/19 1058   05/16/19 1130  vancomycin (VANCOCIN) 1,250 mg in sodium chloride 0.9 % 250 mL IVPB     1,250 mg 166.7 mL/hr over 90 Minutes Intravenous  Once 05/16/19 1100 05/16/19 1404   05/16/19 1000  ampicillin-sulbactam (UNASYN) 1.5 g in  sodium chloride 0.9 % 100 mL IVPB     1.5 g 200 mL/hr over 30 Minutes Intravenous Every 6 hours 05/16/19 0952 05/23/19 2359     Subjective: Seen examined at bedside and states that he was feeling a little bit down today.  States his cough is about the same and slightly improved.  Having some trouble sleeping.  No nausea or vomiting.  No other concerns or points at this time and states that he is getting "bored" being in the hospital.  Objective: Vitals:   07/15/19 0500 07/15/19 1153 07/15/19 2103 07/16/19 0515  BP:  127/70 137/70 135/64  Pulse:  95 93 91  Resp:  18 18 18   Temp:  98.4 F (36.9 C) 98 F (36.7 C) 97.9 F (36.6 C)  TempSrc:  Oral Oral Oral  SpO2:  97% 91% 95%  Weight: 62 kg   11.4 kg  Height:        Intake/Output Summary (Last 24 hours) at 07/16/2019 1318 Last data filed at 07/16/2019 1200 Gross per 24 hour  Intake 1080 ml  Output 1525 ml  Net -445 ml   Filed Weights   07/11/19 0523 07/15/19 0500 07/16/19 0515  Weight: 61 kg 62 kg 11.4 kg   Examination: Physical Exam:  Constitutional: WN/WD Caucasian Male in NAD and appears calm Eyes: Lids and conjunctivae normal, sclerae anicteric  ENMT: External Ears, Nose appear normal. Grossly normal hearing. Mucous membranes are moist. Neck: Appears normal, supple, no cervical masses, normal ROM, no appreciable thyromegaly; no JVD Respiratory: Diminished to auscultation bilaterally, no wheezing, rales, rhonchi or crackles. Normal respiratory effort and patient is not tachypenic. No accessory muscle use. Unlabored breathing but still has a semi-productive cough Cardiovascular: RRR, no murmurs / rubs / gallops. S1 and S2 auscultated. Mild extremity edema and it is non-pitting  Abdomen: Soft, non-tender, non-distended. Bowel sounds positive x4.  GU: Deferred. Musculoskeletal: No clubbing / cyanosis of digits/nails. No joint deformity upper and lower extremities.  Skin: No rashes, lesions, ulcers on a limited skin  evaluation. No induration; Warm and dry.  Neurologic: CN 2-12 grossly intact with no focal deficits. Romberg sign and cerebellar reflexes not assessed.  Psychiatric: Normal judgment and insight. Alert and oriented x 3. Depressed appearing mood and slightly flat affect.   Data Reviewed: I have personally reviewed following labs and imaging studies  CBC: Recent Labs  Lab 07/10/19 0412 07/15/19 0520  WBC 7.3 6.5  NEUTROABS  --  4.1  HGB 12.1* 12.9*  HCT 39.6 41.2  MCV 85.3 85.7  PLT 247 283   Basic Metabolic Panel: Recent Labs  Lab 07/10/19 0412 07/15/19 0520  NA 134* 134*  K 3.8 4.1  CL 100 100  CO2 26 22  GLUCOSE 89 103*  BUN 34* 41*  CREATININE 1.37* 1.32*  CALCIUM 9.4 9.7  MG  --  2.3  PHOS  --  5.0*   GFR: Estimated Creatinine Clearance: 8.2 mL/min (A) (by C-G formula based on SCr of 1.32 mg/dL (H)). Liver Function Tests: Recent Labs  Lab 07/12/19 0431 07/15/19 0520  AST 18 20  ALT 9 12  ALKPHOS 66 68  BILITOT 0.4 0.8  PROT 6.4* 6.9  ALBUMIN 2.8* 3.2*   No results for input(s): LIPASE, AMYLASE in the last 168 hours. No results for input(s): AMMONIA in the last 168 hours. Coagulation Profile: No results for input(s): INR, PROTIME in the last 168 hours. Cardiac Enzymes: No results for input(s): CKTOTAL, CKMB, CKMBINDEX, TROPONINI in the last 168 hours. BNP (last 3 results) No results for input(s): PROBNP in the last 8760 hours. HbA1C: No results for input(s): HGBA1C in the last 72 hours. CBG: Recent Labs  Lab 07/10/19 0754 07/11/19 0522 07/16/19 0512  GLUCAP 154* 89 142*   Lipid Profile: No results for input(s): CHOL, HDL, LDLCALC, TRIG, CHOLHDL, LDLDIRECT in the last 72 hours. Thyroid Function Tests: No results for input(s): TSH, T4TOTAL, FREET4, T3FREE, THYROIDAB in the last 72 hours. Anemia Panel: No results for input(s): VITAMINB12, FOLATE, FERRITIN, TIBC, IRON, RETICCTPCT in the last 72 hours. Sepsis Labs: No results for input(s):  PROCALCITON, LATICACIDVEN in the last 168 hours.  Recent Results (from the past 240 hour(s))  Acid Fast Smear (AFB)     Status: None   Collection Time: 07/12/19 12:43 PM   Specimen: Sputum  Result Value Ref Range Status   AFB Specimen Processing Concentration  Final   Acid Fast Smear Positive  Final    Comment: CRITICAL RESULT CALLED TO, READ BACK BY AND VERIFIED WITH: Sherrye PayorM BULLINS RN [phone] 07/13/19 A BROWNING (NOTE) 3+, 4-36 acid-fast bacilli per field at 400X magnification, fluorescent smear REPORTED RESULTS ON 07-13-19 AT 1526. FAX CONFORMATION TO [phone] Performed At: [facility] [address] [place], [place] [id number] Jolene SchimkeNagendra Sanjai MD YQ:[id number]:[phone] Performed at [facility], [address] [place], [place] [address]    Source (AFB) SPUTUM  Final    Comment: Performed at [facility], [address] [place], [place] [address]    Radiology Studies: Dg Chest Port 1 View  Result Date: 07/15/2019 CLINICAL DATA:  Per order- SOB (shortness of breath). Cavitary tuberculosis identified. EXAM: PORTABLE CHEST 1 VIEW COMPARISON:  Radiograph 07/01/2019, CT 05/15/2019 FINDINGS: Stable cardiac silhouette. Multifocal dense nodular airspace opacities in the LEFT upper lobe and LEFT lower lobe are not improved. Nodular opacities in the RIGHT upper lobe are also not improved. Bullous change in the RIGHT lower lobe noted. IMPRESSION: No significant change in bilateral nodular airspace disease consistent with known cavitary tuberculosis. Electronically Signed   By: Genevive BiStewart  Edmunds M.D.   On: 07/15/2019 09:17    Scheduled Meds: . buPROPion  150 mg Oral Daily  . clotrimazole   Topical BID  . enoxaparin (LOVENOX) injection  40 mg Subcutaneous Q24H  . famotidine  20 mg Oral Daily  . feeding supplement (ENSURE ENLIVE)  237 mL Oral Q24H  . ferrous sulfate  325 mg Oral BID WC  . isoniazid  300 mg Oral Daily   And  . vitamin B-6  50 mg Oral Daily  .  loratadine  10 mg Oral Q breakfast  . mouth rinse  15 mL Mouth Rinse BID  . mometasone-formoterol  2 puff Inhalation BID  . multivitamin with minerals  1 tablet Oral Daily  . pravastatin  20 mg Oral  q1800  . pyrazinamide  1,500 mg Oral Daily  . rifampin  600 mg Oral Daily  . thiamine  100 mg Oral Daily   Continuous Infusions: . sodium chloride Stopped (05/23/19 0250)    LOS: 62 days   Merlene Laughter, DO [facility] PAGER is on AMION  If 7PM-7AM, please contact night-coverage [email or web address] Password [email or web address] 07/16/2019, 1:18 PM

## 2019-07-17 LAB — GLUCOSE, CAPILLARY: Glucose-Capillary: 104 mg/dL — ABNORMAL HIGH (ref 70–99)

## 2019-07-17 NOTE — Progress Notes (Signed)
Physical Therapy Treatment Patient Details Name: ACEY WOODFIELD MRN: [id number] DOB: 13-May-1946 Today's Date: 07/17/2019    History of Present Illness 73 y.o. male with medical history significant of cardiomegaly, stroke, COPD,   CKD stage III, tobacco abuse, DM 2, HTN who presented to the emergency room on 8/10 with progressive weakness over the last 3 months to the point where he was falling over. He has lost approximately 43 pounds in the past 2 months. Pt found to have Pulmonary tuberculosis with cavitation    PT Comments    Pt continues to participate well. He mentioned that he had "tore this room up" walking around looking for his phone. He admitted he had not been using the walker. I cautioned him against this, especially when he gets up without assistance/supervision. I strongly encouraged him to use his RW for ambulation safety. I agreed to assess him and work with him with less UE support. He is much less steady without UE support. I explained again the importance of using RW for safety to prevent falls-he acknowledged this and agreed to use RW.  Will continue to follow and progress activity/challenge him safely. SpO2: 89% on RA during session today.    Follow Up Recommendations  Home health PT;Supervision/Assistance - 24 hour;SNF     Equipment Recommendations  Rolling walker with 5" wheels    Recommendations for Other Services       Precautions / Restrictions Precautions Precautions: Fall Precaution Comments: monitor O2 Restrictions Weight Bearing Restrictions: No    Mobility  Bed Mobility Overal bed mobility: Modified Independent                Transfers Overall transfer level: Needs assistance Equipment used: Rolling walker (2 wheeled);None Transfers: Sit to/from Stand Sit to Stand: Supervision;Min guard         General transfer comment: increased time. cues for safety, technique. Stood a couple of times without RW support. Less steady but pt wanted  to practice.  Ambulation/Gait Ambulation/Gait assistance: Min guard Gait Distance (Feet): 90 Feet(total (30'x3)) Assistive device: Rolling walker (2 wheeled);IV Pole Gait Pattern/deviations: Step-through pattern;Decreased stride length;Trunk flexed     General Gait Details: walked x 1 with RW, x 1 with IV pole, and x 1 without UE support. Pt wanted to practice. Explained that this is okay with assistance (PT/OT) but he should not walk without RW support on his own.   Stairs             Wheelchair Mobility    Modified Rankin (Stroke Patients Only)       Balance Overall balance assessment: Needs assistance           Standing balance-Leahy Scale: Fair                              Cognition Arousal/Alertness: Awake/alert Behavior During Therapy: WFL for tasks assessed/performed Overall Cognitive Status: Within Functional Limits for tasks assessed                                        Exercises General Exercises - Lower Extremity Hip Flexion/Marching: AROM;Left;Right;10 reps;Standing Heel Raises: AROM;Both;10 reps;Standing Mini-Sqauts: AROM;Both;5 reps Other Exercises Other Exercises: Knee flexion, standing, R and L, 10 reps, active    General Comments        Pertinent Vitals/Pain Pain Assessment: No/denies pain  Home Living                      Prior Function            PT Goals (current goals can now be found in the care plan section) Progress towards PT goals: Progressing toward goals    Frequency    Min 3X/week      PT Plan Current plan remains appropriate    Co-evaluation              AM-PAC PT "6 Clicks" Mobility   Outcome Measure  Help needed turning from your back to your side while in a flat bed without using bedrails?: None Help needed moving from lying on your back to sitting on the side of a flat bed without using bedrails?: None Help needed moving to and from a bed to a chair  (including a wheelchair)?: A Little Help needed standing up from a chair using your arms (e.g., wheelchair or bedside chair)?: A Little Help needed to walk in hospital room?: A Little Help needed climbing 3-5 steps with a railing? : A Little 6 Click Score: 20    End of Session Equipment Utilized During Treatment: Gait belt Activity Tolerance: Patient tolerated treatment well Patient left: in bed;with call bell/phone within reach   PT Visit Diagnosis: Muscle weakness (generalized) (M62.81);Difficulty in walking, not elsewhere classified (R26.2)     Time: 2395-3202 PT Time Calculation (min) (ACUTE ONLY): 41 min  Charges:  $Gait Training: 8-22 mins $Therapeutic Exercise: 8-22 mins $Therapeutic Activity: 8-22 mins                        Weston Anna, PT Acute Rehabilitation Services Pager: [phone] Office: [phone]

## 2019-07-17 NOTE — Progress Notes (Addendum)
PROGRESS NOTE    Clarence Maldonado  BHA:[id number] DOB: [birth year] DOA: 05/15/2019 PCP: [name], DO   Brief Narrative:  Is a 73 year old Caucasian male with a past medical history significant for but not limited to history of CVA, COPD, CKD stage III, tobacco abuse, diabetes mellitus type 2, hypertension as well as other comorbidities who presented with weakness and frequent falls and was found to have severe pulmonary tuberculosis.  Infectious diseases was consulted and he was started on treatment he is clinically improving on anti-TB treatment.  Hospitalization has been complicated by his general debility, cognitive deficits, as well as public health concerns given his acute pulmonary tuberculosis.  Last AFB from 07/04/19 was still positive for MTB.  Repeat ordered on 07/12/19 and was still positive for MTB.  PT OT recommended SNF but is not an option given his active pulmonary tuberculosis.  Currently he is improving and he is on day 39 of treatment.  Infectious disease is intermittently following and last saw the patient on 07/07/2019.  He still has a productive cough but states it is improved.   Assessment & Plan:   Principal Problem:   Pulmonary tuberculosis with cavitation Active Problems:   Essential hypertension   CKD (chronic kidney disease) stage 3, GFR 30-59 ml/min (HCC)   Hyperlipidemia   Tobacco use disorder   Controlled type 2 diabetes mellitus with stage 3 chronic kidney disease, without long-term current use of insulin (HCC)   Weight loss   Hyperlipidemia associated with type 2 diabetes mellitus (HCC)   Anemia   Moderate protein-calorie malnutrition (HCC)  Severe cavitary pulmonary TB -Infectious Diseases is following intermittently  -Continues to make progress on current regimen of rifampin isoniazid and ethambutol, day 20 rifampin, pyrazinamide, INH and day 39 of treatment -Noted to have significant disease burden based on last sputum culture, Repeat sputum from 9/29  still positive -Quite physically deconditioned to discharge home alone safely without assistance,, also has cognitive, memory deficits, discussed with case management, social work apparently SNF is not an option because of his active pulmonary TB and needs to be smear negative -Continue with treatment per ID recommendations with isoniazid 300 mg p.o. daily along with pyridoxine 50 mg p.o. daily, pyrazinamide 1500 mg p.o. daily, rifampin 6 mg p.o. daily -Continue with inhalers with Dulera 2 puffs IH twice daily, as well as Atrovent 2 puffs IH every 6 as needed for shortness of breath as well as Xopenex inhaler 2 puffs IH every 8 PRN for wheezing -C/w In-house PT/OT Evaluate and Treat  -Discharge planning challenging at this time -Recheck AFB Smear ordered on 10/6 was sent down on 10/7 and currently is Positive with Cx still pending -Dr. Luciana Axe recommends no changes to Regimen -CXR on 07/15/2019 showed "Stable cardiac silhouette. Multifocal dense nodular airspace opacities in the LEFT upper lobe and LEFT lower lobe are not improved. Nodular opacities in the RIGHT upper lobe are also not improved. Bullous change in the RIGHT lower lobe noted" and overall impression was "No significant change in bilateral nodular airspace disease consistent with known cavitary tuberculosis." -Continue treatment per ID recommendation  Acute Kidney Injury on Chronic Kidney Disease stage III -Likely from poor p.o. intake. -On admission serum creatinine noted to be 1.77 -Now stable and BUN/Cr is 41/1.37 on 07/15/2019  -Avoid Nephrotoxic Medications, Contrast Dyes, and Hypotension -Repeat CMP intermittently and will repeat labs biweekly and next 1 to be drawn is on Wednesday, 07/19/1999  Dysphagia -Evaluated by speech therapy.  -MBS significant for  evidence of moderate oropharyngeal dysphagia with sensorimotor deficit and hypomotility of the pharynx.  -Patient deemed a high aspiration risk and placed on a dysphagia 1  diet, subsequently improved and transitioned to regular diet -Speech therapy following and appreciate further recommendations   Cognitive and Memory deficits -Patient keeps forgetting and does not recall the fact that he is active pulmonary TB -TSH normal a month ago, follow-up B12 was 457 on 07/11/2019   Peripheral Neuropathy -Concern this could be related to RIPE therapy. Started on B6.  -Symptoms resolved for now and will need to continue to Monitor carefully   Left Atrial Mobile Mass -Echocardiogram on 05/16/2019 showed a possible left atrial mobile mass. -Outpatient Cardiology follow-up warranted   Non-sustained V-tach -Resolved. -Hypokalemia and magnesium likely contributed. -C/w Biweekly labs and next Labs to be done 07/15/2019   Hypokalemia/Hypomagnesemia -Potassium normalized and was 4.1 on 07/15/2019  -Mag Level was 2.3 -Continue to Monitor and Trend Biweekly labs  -Will repeat on Wednesday 07/19/2019 AM   Iron Deficiency Anemia -Continue iron supplementation with Ferrous Sulfate 325 mg po BID -Continue to Monitor for S/Sx of Bleeding; Currently No overt bleeding reported -Patient's Hgb/Hct is now 12.9/41.2 and was checked on 07/15/2019 -Continue to Monitor and Repeat CBC on Wednesday 07/19/2019  Diabetes Mellitus Type 2, controlled without any complication. -Hemoglobin A1c 6.3 on 05/16/2019. -Blood sugars are well controlled as CBG's ranging from 104-142 -Not on SSI anymore  Moderate Protein Calorie Malnutrition -RD consult appreciated, continue supplements as tolerated with Ensure Enlive 237 mL po q24h  Insomnia -C/w Trazodone 50 mg qHS nightly -Asking for Nyquil   Hyponatremia -Na+ is now 134 on 07/15/2019  -Continue to Monitor and Trend -Repeat CMP intermittently   HLD -C/w Pravastatin 20 mg po qHS  DVT prophylaxis: Enoxaparin 40 mg sq q24h Code Status: FULL CODE Family Communication: None Disposition Plan: Unclear at this time as PT/OT  recommending SNF vs. [facility] but is not an option given that patient has active pulmonary TB and he cannot currently go home because he is unsafe discharge to his trailer at this time  Consultants: Infectious Diseases     Procedures: None   Antimicrobials:  Anti-infectives (From admission, onward)   Start     Dose/Rate Route Frequency Ordered Stop   06/03/19 1000  ethambutol (MYAMBUTOL) tablet 1,200 mg  Status:  Discontinued     1,200 mg Oral Daily 06/02/19 1735 06/27/19 1339   06/03/19 1000  isoniazid (NYDRAZID) tablet 300 mg     300 mg Oral Daily 06/02/19 1735     06/03/19 1000  pyrazinamide tablet 1,500 mg     1,500 mg Oral Daily 06/02/19 1735     06/03/19 1000  rifampin (RIFADIN) capsule 600 mg     600 mg Oral Daily 06/02/19 1735     05/24/19 1000  pyrazinamide tablet 1,500 mg  Status:  Discontinued     1,500 mg Oral Daily 05/23/19 1626 06/02/19 1555   05/24/19 1000  ethambutol (MYAMBUTOL) tablet 1,200 mg  Status:  Discontinued     1,200 mg Oral Daily 05/23/19 1626 06/02/19 1555   05/19/19 1600  isoniazid (NYDRAZID) tablet 300 mg  Status:  Discontinued     300 mg Oral Daily 05/19/19 1428 06/02/19 1555   05/19/19 1600  rifampin (RIFADIN) capsule 600 mg  Status:  Discontinued     600 mg Oral Daily 05/19/19 1428 06/02/19 1555   05/19/19 1600  pyrazinamide tablet 1,000 mg  Status:  Discontinued  1,000 mg Oral Daily 05/19/19 1428 05/23/19 1626   05/19/19 1600  ethambutol (MYAMBUTOL) tablet 800 mg  Status:  Discontinued     800 mg Oral Daily 05/19/19 1428 05/23/19 1626   05/18/19 0000  vancomycin (VANCOCIN) IVPB 1000 mg/200 mL premix  Status:  Discontinued     1,000 mg 200 mL/hr over 60 Minutes Intravenous Every 36 hours 05/16/19 1219 05/17/19 1058   05/16/19 1130  vancomycin (VANCOCIN) 1,250 mg in sodium chloride 0.9 % 250 mL IVPB     1,250 mg 166.7 mL/hr over 90 Minutes Intravenous  Once 05/16/19 1100 05/16/19 1404   05/16/19 1000  ampicillin-sulbactam (UNASYN) 1.5 g in  sodium chloride 0.9 % 100 mL IVPB     1.5 g 200 mL/hr over 30 Minutes Intravenous Every 6 hours 05/16/19 [id number] 05/23/19 2359     Subjective: Seen examined at bedside and is extremely frustrated and agitated this morning because his phone was found in the garbage and because his friend had brought him some facial razors and the nail clippers was not delivered to his room in time and he is mad about that.  Patient went on a tirade and started cursing significantly.  I tried to reassure the patient and calm him down and he states that he is "tired of being in the hospital".  He is also very upset that he did not know how his phone had ended up in the garbage can.  No other concerns or points at this time and he calmed down after I talked to him.  He still is coughing up some sputum but not bad.  Objective: Vitals:   07/16/19 0515 07/17/19 0000 07/17/19 0409 07/17/19 1200  BP: 135/64 136/69 135/63 (!) 154/92  Pulse: 91 100 97 (!) 115  Resp: 18 16 18 18   Temp: 97.9 F (36.6 C) 98 F (36.7 C) 98.4 F (36.9 C) 97.7 F (36.5 C)  TempSrc: Oral Oral Oral Oral  SpO2: 95% 96% 94% 95%  Weight: 11.4 kg     Height:        Intake/Output Summary (Last 24 hours) at 07/17/2019 1334 Last data filed at 07/17/2019 0410 Gross per 24 hour  Intake 100 ml  Output 850 ml  Net -750 ml   Filed Weights   07/11/19 0523 07/15/19 0500 07/16/19 0515  Weight: 61 kg 62 kg 11.4 kg   Examination: Physical Exam:  Constitutional: WN/WD Caucasian male who is very agitated and upset and he is cursing,  Eyes: Lids and conjunctivae normal, sclerae anicteric  ENMT: External Ears, Nose appear normal. Grossly normal hearing. Mucous membranes are moist.  Neck: Appears normal, supple, no cervical masses, normal ROM, no appreciable thyromegaly;no JVD Respiratory: Diminished to auscultation bilaterally, no wheezing, rales, rhonchi or crackles. Normal respiratory effort and patient is not tachypenic. No accessory muscle use.  Unlabored breathing Cardiovascular: Tachycardic Rate but Regular Rhythm, no murmurs / rubs / gallops. S1 and S2 auscultated. Mild extremity edema.  Abdomen: Soft, non-tender, non-distended. Bowel sounds positive x4.  GU: Deferred. Musculoskeletal: No clubbing / cyanosis of digits/nails. No joint deformity upper and lower extremities.  Skin: No rashes, lesions, ulcers on a limited skin evaluation. No induration; Warm and dry.   Neurologic: CN 2-12 grossly intact with no focal deficits. Romberg sign and  cerebellar reflexes not assessed.  Psychiatric: Normal judgment and insight. Alert and oriented x 3. Severely agitated and anxious mood and appropriate affect.   Data Reviewed: I have personally reviewed following labs and imaging studies  CBC: Recent Labs  Lab 07/15/19 0520  WBC 6.5  NEUTROABS 4.1  HGB 12.9*  HCT 41.2  MCV 85.7  PLT 230   Basic Metabolic Panel: Recent Labs  Lab 07/15/19 0520  NA 134*  K 4.1  CL 100  CO2 22  GLUCOSE 103*  BUN 41*  CREATININE 1.32*  CALCIUM 9.7  MG 2.3  PHOS 5.0*   GFR: Estimated Creatinine Clearance: 8.2 mL/min (A) (by C-G formula based on SCr of 1.32 mg/dL (H)). Liver Function Tests: Recent Labs  Lab 07/12/19 0431 07/15/19 0520  AST 18 20  ALT 9 12  ALKPHOS 66 68  BILITOT 0.4 0.8  PROT 6.4* 6.9  ALBUMIN 2.8* 3.2*   No results for input(s): LIPASE, AMYLASE in the last 168 hours. No results for input(s): AMMONIA in the last 168 hours. Coagulation Profile: No results for input(s): INR, PROTIME in the last 168 hours. Cardiac Enzymes: No results for input(s): CKTOTAL, CKMB, CKMBINDEX, TROPONINI in the last 168 hours. BNP (last 3 results) No results for input(s): PROBNP in the last 8760 hours. HbA1C: No results for input(s): HGBA1C in the last 72 hours. CBG: Recent Labs  Lab 07/11/19 0522 07/16/19 0512 07/17/19 0812  GLUCAP 89 142* 104*   Lipid Profile: No results for input(s): CHOL, HDL, LDLCALC, TRIG, CHOLHDL,  LDLDIRECT in the last 72 hours. Thyroid Function Tests: No results for input(s): TSH, T4TOTAL, FREET4, T3FREE, THYROIDAB in the last 72 hours. Anemia Panel: No results for input(s): VITAMINB12, FOLATE, FERRITIN, TIBC, IRON, RETICCTPCT in the last 72 hours. Sepsis Labs: No results for input(s): PROCALCITON, LATICACIDVEN in the last 168 hours.  Recent Results (from the past 240 hour(s))  Acid Fast Smear (AFB)     Status: None   Collection Time: 07/12/19 12:43 PM   Specimen: Sputum  Result Value Ref Range Status   AFB Specimen Processing Concentration  Final   Acid Fast Smear Positive  Final    Comment: CRITICAL RESULT CALLED TO, READ BACK BY AND VERIFIED WITH: Sherrye Payor RN [id number] 07/13/19 A BROWNING (NOTE) 3+, 4-36 acid-fast bacilli per field at 400X magnification, fluorescent smear REPORTED RESULTS ON 07-13-19 AT 1526. FAX CONFORMATION TO [phone] Performed At: [facility] [address] [place], [place] [id number] Jolene Schimke MD [id number] Performed at [facility], [address] [place], [place] [address]    Source (AFB) SPUTUM  Final    Comment: Performed at [facility], [address], [place], [place] [address]    Radiology Studies: No results found.  Scheduled Meds: . buPROPion  150 mg Oral Daily  . clotrimazole   Topical BID  . enoxaparin (LOVENOX) injection  40 mg Subcutaneous Q24H  . famotidine  20 mg Oral Daily  . feeding supplement (ENSURE ENLIVE)  237 mL Oral Q24H  . ferrous sulfate  325 mg Oral BID WC  . isoniazid  300 mg Oral Daily   And  . vitamin B-6  50 mg Oral Daily  . loratadine  10 mg Oral Q breakfast  . mouth rinse  15 mL Mouth Rinse BID  . mometasone-formoterol  2 puff Inhalation BID  . multivitamin with minerals  1 tablet Oral Daily  . pravastatin  20 mg Oral q1800  . pyrazinamide  1,500 mg Oral Daily  . rifampin  600 mg Oral Daily  . thiamine  100 mg Oral Daily   Continuous Infusions: . sodium  chloride Stopped (05/23/19 0250)    LOS: 63 days   Omair  Peterson AoLatif Sheikh, DO [facility] PAGER is on AMION  If 7PM-7AM, please contact night-coverage [email or web address] Password [email or web address] 07/17/2019, 1:34 PM

## 2019-07-18 NOTE — Progress Notes (Signed)
Occupational Therapy Treatment Patient Details Name: Clarence Maldonado MRN: [id number] DOB: [birth year] Today's Date: 07/18/2019    History of present illness 73 y.o. male with medical history significant of cardiomegaly, stroke, COPD,   CKD stage III, tobacco abuse, DM 2, HTN who presented to the emergency room on 8/10 with progressive weakness over the last 3 months to the point where he was falling over. He has lost approximately 43 pounds in the past 2 months. Pt found to have Pulmonary tuberculosis with cavitation   OT comments  Upgraded all of Clarence Maldonado's exercises to level 2 theraband. Pt has pictures from last time, but he did need cues for one of them.  Regardless if he does them incorrectly, he will still benefit from them. Pt had completed adl and did not want to get OOB this visit as he had eated and was winding down to take a nap.      Follow Up Recommendations  Home health OT;Supervision/Assistance - 24 hour    Equipment Recommendations  3 in 1 bedside commode    Recommendations for Other Services      Precautions / Restrictions Precautions Precautions: Fall Precaution Comments: monitor O2       Mobility Bed Mobility                  Transfers                      Balance                                           ADL either performed or assessed with clinical judgement   ADL                                               Vision       Perception     Praxis      Cognition Arousal/Alertness: Awake/alert Behavior During Therapy: WFL for tasks assessed/performed                                   General Comments: pt reports being up in room alone with AD. Strongly encouraged him to use RW for safety        Exercises General Exercises - Upper Extremity Theraband Level (Shoulder Flexion): Level 2 (Red) Shoulder Extension: AROM;Strengthening;Both;10 reps Shoulder Horizontal ABduction:  Strengthening;Both;10 reps;Theraband Theraband Level (Shoulder Horizontal Abduction): Level 2 (Red) Elbow Flexion: AROM;Both;Theraband;10 reps Theraband Level (Elbow Flexion): Level 2 (Red)   Shoulder Instructions       General Comments      Pertinent Vitals/ Pain       Pain Assessment: No/denies pain  Home Living                                          Prior Functioning/Environment              Frequency  Min 2X/week        Progress Toward Goals  OT Goals(current goals can now be found in the care plan section)  Progress towards OT  goals: Progressing toward goals     Plan      Co-evaluation                 AM-PAC OT "6 Clicks" Daily Activity     Outcome Measure   Help from another person eating meals?: None Help from another person taking care of personal grooming?: A Little Help from another person toileting, which includes using toliet, bedpan, or urinal?: A Little Help from another person bathing (including washing, rinsing, drying)?: A Little Help from another person to put on and taking off regular upper body clothing?: A Little Help from another person to put on and taking off regular lower body clothing?: A Little 6 Click Score: 19    End of Session    OT Visit Diagnosis: Muscle weakness (generalized) (M62.81);Unsteadiness on feet (R26.81)   Activity Tolerance     Patient Left     Nurse Communication          Time: 2330-0762 OT Time Calculation (min): 21 min  Charges: OT Treatments $Therapeutic Exercise: 8-22 mins  Lesle Chris, OTR/L Acute Rehabilitation Services [phone] [place] pager [phone] office 07/18/2019   Joseline Mccampbell 07/18/2019, 5:02 PM

## 2019-07-18 NOTE — Progress Notes (Signed)
PROGRESS NOTE    Clarence Maldonado  HYI:[id number] DOB: 1945/10/29 DOA: 05/15/2019 PCP: [name], DO   Brief Narrative:  Is a 73 year old Caucasian male with a past medical history significant for but not limited to history of CVA, COPD, CKD stage III, tobacco abuse, diabetes mellitus type 2, hypertension as well as other comorbidities who presented with weakness and frequent falls and was found to have severe pulmonary tuberculosis.  Infectious diseases was consulted and he was started on treatment he is clinically improving on anti-TB treatment.  Hospitalization has been complicated by his general debility, cognitive deficits, as well as public health concerns given his acute pulmonary tuberculosis.  Last AFB from 07/04/19 was still positive for MTB.  Repeat ordered on 07/12/19 and was still positive for MTB.  PT OT recommended SNF but is not an option given his active pulmonary tuberculosis.  Currently he is improving and he is on day 40 of treatment.  Infectious disease is intermittently following and last saw the patient on 07/07/2019.  He still has a productive cough but states it is improved.   Assessment & Plan:   Principal Problem:   Pulmonary tuberculosis with cavitation Active Problems:   Essential hypertension   CKD (chronic kidney disease) stage 3, GFR 30-59 ml/min (HCC)   Hyperlipidemia   Tobacco use disorder   Controlled type 2 diabetes mellitus with stage 3 chronic kidney disease, without long-term current use of insulin (HCC)   Weight loss   Hyperlipidemia associated with type 2 diabetes mellitus (HCC)   Anemia   Moderate protein-calorie malnutrition (HCC)  Severe cavitary pulmonary TB -Infectious Diseases is following intermittently  -Continues to make progress on current regimen of rifampin isoniazid and ethambutol, day 21 rifampin, pyrazinamide Day 61, INH and day 40 of treatment; ID pharmacist discussed wearing and per guidelines as pyrazinamide can be stopped after  56 doses and this was what will be done today.  Since his AFB remains positive he will need several more months of rifampin and isoniazid plus vitamin B1 for total of 61-month therapy -Noted to have significant disease burden based on last sputum culture, Repeat sputum from 9/29 still positive and from 10 7 is also to bother; will resend a sputum culture today -Quite physically deconditioned to discharge home alone safely without assistance,, also has cognitive, memory deficits, discussed with case management, social work apparently SNF is not an option because of his active pulmonary TB and needs to be smear negative -Continue with treatment per ID recommendations with isoniazid 300 mg p.o. daily along with pyridoxine 50 mg p.o. daily, pyrazinamide 1500 mg p.o. daily, rifampin 6 mg p.o. daily -Continue with inhalers with Dulera 2 puffs IH twice daily, as well as Atrovent 2 puffs IH every 6 as needed for shortness of breath as well as Xopenex inhaler 2 puffs IH every 8 PRN for wheezing -C/w In-house PT/OT Evaluate and Treat  -Discharge planning challenging at this time -Recheck AFB Smear ordered on 10/6 was sent down on 10/7 and currently is Positive with Cx still pending; Will re-send Sputum today -Dr. Luciana Axe recommends no changes to Regimen -CXR on 07/15/2019 showed "Stable cardiac silhouette. Multifocal dense nodular airspace opacities in the LEFT upper lobe and LEFT lower lobe are not improved. Nodular opacities in the RIGHT upper lobe are also not improved. Bullous change in the RIGHT lower lobe noted" and overall impression was "No significant change in bilateral nodular airspace disease consistent with known cavitary tuberculosis." -Continue treatment per ID recommendation  and Pyrazinamide to be stopped today -Repeat CXR intermittently   Acute Kidney Injury on Chronic Kidney Disease stage III -Likely from poor p.o. intake. -On admission serum creatinine noted to be 1.77 -Now stable and BUN/Cr  is 41/1.37 on 07/15/2019  -Avoid Nephrotoxic Medications, Contrast Dyes, and Hypotension -Repeat CMP intermittently and will repeat labs biweekly and will repeat CMP in AM  Dysphagia -Evaluated by speech therapy.  -MBS significant for evidence of moderate oropharyngeal dysphagia with sensorimotor deficit and hypomotility of the pharynx.  -Patient deemed a high aspiration risk and placed on a dysphagia 1 diet, subsequently improved and transitioned to regular diet -Speech therapy following and appreciate further recommendations   Cognitive and Memory deficits -Patient keeps forgetting and does not recall the fact that he is active pulmonary TB -TSH normal a month ago, follow-up B12 was 457 on 07/11/2019   Peripheral Neuropathy -Concern this could be related to RIPE therapy. Started on B6.  -Symptoms resolved for now and will need to continue to Monitor carefully   Left Atrial Mobile Mass -Echocardiogram on 05/16/2019 showed a possible left atrial mobile mass. -Outpatient Cardiology follow-up warranted   Non-sustained V-tach -Resolved. -Hypokalemia and magnesium likely contributed. -C/w Biweekly labs and next Labs to be done 07/15/2019   Hypokalemia/Hypomagnesemia -Potassium normalized and was 4.1 on 07/15/2019  -Mag Level was 2.3 -Continue to Monitor and Trend Biweekly labs  -Will repeat on Wednesday 07/19/2019 AM   Iron Deficiency Anemia -Continue iron supplementation with Ferrous Sulfate 325 mg po BID -Continue to Monitor for S/Sx of Bleeding; Currently No overt bleeding reported -Patient's Hgb/Hct is now 12.9/41.2 and was checked on 07/15/2019 -Continue to Monitor and Repeat CBC on Wednesday 07/19/2019  Diabetes Mellitus Type 2, controlled without any complication. -Hemoglobin A1c 6.3 on 05/16/2019. -Blood sugars are well controlled as CBG's ranging from 104-142 -Not on SSI anymore  Moderate Protein Calorie Malnutrition -RD consult appreciated, continue supplements  as tolerated with Ensure Enlive 237 mL po q24h  Insomnia -C/w Trazodone 50 mg qHS nightly -Asking for Nyquil   Hyponatremia -Na+ is now 134 on 07/15/2019  -Continue to Monitor and Trend -Repeat CMP intermittently and will repeat in AM   HLD -C/w Pravastatin 20 mg po qHS  DVT prophylaxis: Enoxaparin 40 mg sq q24h Code Status: FULL CODE Family Communication: None Disposition Plan: Unclear at this time as PT/OT recommending SNF vs. Home Health but is not an option given that patient has active pulmonary TB and he cannot currently go home because he is unsafe discharge to his trailer at this time  Consultants: Infectious Diseases     Procedures: None   Antimicrobials:  Anti-infectives (From admission, onward)   Start     Dose/Rate Route Frequency Ordered Stop   06/03/19 1000  ethambutol (MYAMBUTOL) tablet 1,200 mg  Status:  Discontinued     1,200 mg Oral Daily 06/02/19 1735 06/27/19 1339   06/03/19 1000  isoniazid (NYDRAZID) tablet 300 mg     300 mg Oral Daily 06/02/19 1735     06/03/19 1000  pyrazinamide tablet 1,500 mg  Status:  Discontinued     1,500 mg Oral Daily 06/02/19 1735 07/18/19 1308   06/03/19 1000  rifampin (RIFADIN) capsule 600 mg     600 mg Oral Daily 06/02/19 1735     05/24/19 1000  pyrazinamide tablet 1,500 mg  Status:  Discontinued     1,500 mg Oral Daily 05/23/19 1626 06/02/19 1555   05/24/19 1000  ethambutol (MYAMBUTOL) tablet 1,200 mg  Status:  Discontinued     1,200 mg Oral Daily 05/23/19 1626 06/02/19 1555   05/19/19 1600  isoniazid (NYDRAZID) tablet 300 mg  Status:  Discontinued     300 mg Oral Daily 05/19/19 1428 06/02/19 1555   05/19/19 1600  rifampin (RIFADIN) capsule 600 mg  Status:  Discontinued     600 mg Oral Daily 05/19/19 1428 06/02/19 1555   05/19/19 1600  pyrazinamide tablet 1,000 mg  Status:  Discontinued     1,000 mg Oral Daily 05/19/19 1428 05/23/19 1626   05/19/19 1600  ethambutol (MYAMBUTOL) tablet 800 mg  Status:  Discontinued      800 mg Oral Daily 05/19/19 1428 05/23/19 1626   05/18/19 0000  vancomycin (VANCOCIN) IVPB 1000 mg/200 mL premix  Status:  Discontinued     1,000 mg 200 mL/hr over 60 Minutes Intravenous Every 36 hours 05/16/19 1219 05/17/19 1058   05/16/19 1130  vancomycin (VANCOCIN) 1,250 mg in sodium chloride 0.9 % 250 mL IVPB     1,250 mg 166.7 mL/hr over 90 Minutes Intravenous  Once 05/16/19 1100 05/16/19 1404   05/16/19 1000  ampicillin-sulbactam (UNASYN) 1.5 g in sodium chloride 0.9 % 100 mL IVPB     1.5 g 200 mL/hr over 30 Minutes Intravenous Every 6 hours 05/16/19 0952 05/23/19 2359     Subjective: Seen examined at bedside and removed today and is actually pleasantly surprised about his amatory status and states that he feels little bit stronger.  Says that he slept well recently and was feeling good.  No chest pain, lightheadedness or dizziness and still is coughing up sputum but states he can really only cough up the sputum when he lays flat.  Still wondering when his treatment will be stopped and when he can be discharged from hospital.  No other concerns or complaints at this time.  Objective: Vitals:   07/17/19 0409 07/17/19 1200 07/17/19 2205 07/18/19 0652  BP: 135/63 (!) 154/92 135/70 134/67  Pulse: 97 (!) 115 92 86  Resp: 18 18 18 18   Temp: 98.4 F (36.9 C) 97.7 F (36.5 C) 98.6 F (37 C) 97.9 F (36.6 C)  TempSrc: Oral Oral Oral Oral  SpO2: 94% 95% 95% 95%  Weight:      Height:        Intake/Output Summary (Last 24 hours) at 07/18/2019 1435 Last data filed at 07/18/2019 1000 Gross per 24 hour  Intake 270 ml  Output 375 ml  Net -105 ml   Filed Weights   07/11/19 0523 07/15/19 0500 07/16/19 0515  Weight: 61 kg 62 kg 11.4 kg   Examination: Physical Exam:  Constitutional: WN/WD Caucasian male  In NAD and appears calm and comfortable today  Eyes: Lids and conjunctivae normal, sclerae anicteric  ENMT: External Ears, Nose appear normal. Grossly normal hearing. Mucous  membranes are moist. Neck: Appears normal, supple, no cervical masses, normal ROM, no appreciable thyromegaly; no JVD Respiratory: Diminished to auscultation bilaterally, no wheezing, rales, rhonchi or crackles. Normal respiratory effort and patient is not tachypenic. No accessory muscle use. Unlabored breathing  Cardiovascular: RRR, no murmurs / rubs / gallops. S1 and S2 auscultated. Trace extremity edema.   Abdomen: Soft, non-tender, non-distended. Bowel sounds positive x4.  GU: Deferred. Musculoskeletal: No clubbing / cyanosis of digits/nails. No joint deformity upper and lower extremities.  Skin: No rashes, lesions, ulcers on a limited skin evaluation. No induration; Warm and dry.  Neurologic: CN 2-12 grossly intact with no focal deficits. Romberg sign and cerebellar reflexes not  assessed.  Psychiatric: Normal judgment and insight. Alert and oriented x 3. Pleasant mood and appropriate affect today.   Data Reviewed: I have personally reviewed following labs and imaging studies  CBC: Recent Labs  Lab 07/15/19 0520  WBC 6.5  NEUTROABS 4.1  HGB 12.9*  HCT 41.2  MCV 85.7  PLT 230   Basic Metabolic Panel: Recent Labs  Lab 07/15/19 0520  NA 134*  K 4.1  CL 100  CO2 22  GLUCOSE 103*  BUN 41*  CREATININE 1.32*  CALCIUM 9.7  MG 2.3  PHOS 5.0*   GFR: Estimated Creatinine Clearance: 8.2 mL/min (A) (by C-G formula based on SCr of 1.32 mg/dL (H)). Liver Function Tests: Recent Labs  Lab 07/12/19 0431 07/15/19 0520  AST 18 20  ALT 9 12  ALKPHOS 66 68  BILITOT 0.4 0.8  PROT 6.4* 6.9  ALBUMIN 2.8* 3.2*   No results for input(s): LIPASE, AMYLASE in the last 168 hours. No results for input(s): AMMONIA in the last 168 hours. Coagulation Profile: No results for input(s): INR, PROTIME in the last 168 hours. Cardiac Enzymes: No results for input(s): CKTOTAL, CKMB, CKMBINDEX, TROPONINI in the last 168 hours. BNP (last 3 results) No results for input(s): PROBNP in the last 8760  hours. HbA1C: No results for input(s): HGBA1C in the last 72 hours. CBG: Recent Labs  Lab 07/16/19 0512 07/17/19 0812  GLUCAP 142* 104*   Lipid Profile: No results for input(s): CHOL, HDL, LDLCALC, TRIG, CHOLHDL, LDLDIRECT in the last 72 hours. Thyroid Function Tests: No results for input(s): TSH, T4TOTAL, FREET4, T3FREE, THYROIDAB in the last 72 hours. Anemia Panel: No results for input(s): VITAMINB12, FOLATE, FERRITIN, TIBC, IRON, RETICCTPCT in the last 72 hours. Sepsis Labs: No results for input(s): PROCALCITON, LATICACIDVEN in the last 168 hours.  Recent Results (from the past 240 hour(s))  Acid Fast Smear (AFB)     Status: None   Collection Time: 07/12/19 12:43 PM   Specimen: Sputum  Result Value Ref Range Status   AFB Specimen Processing Concentration  Final   Acid Fast Smear Positive  Final    Comment: CRITICAL RESULT CALLED TO, READ BACK BY AND VERIFIED WITH: Sherrye PayorM BULLINS RN [id number] 07/13/19 A BROWNING (NOTE) 3+, 4-36 acid-fast bacilli per field at 400X magnification, fluorescent smear REPORTED RESULTS ON 07-13-19 AT 1526. FAX CONFORMATION TO [phone] Performed At: [facility] [address] [place], [place] [id number] Jolene SchimkeNagendra Sanjai MD AO:[id number]:[phone] Performed at [facility], [address] [place], [place] [address]    Source (AFB) SPUTUM  Final    Comment: Performed at [facility], [address] [place], [place] [address]    Radiology Studies: No results found.  Scheduled Meds: . buPROPion  150 mg Oral Daily  . clotrimazole   Topical BID  . enoxaparin (LOVENOX) injection  40 mg Subcutaneous Q24H  . famotidine  20 mg Oral Daily  . feeding supplement (ENSURE ENLIVE)  237 mL Oral Q24H  . ferrous sulfate  325 mg Oral BID WC  . isoniazid  300 mg Oral Daily   And  . vitamin B-6  50 mg Oral Daily  . loratadine  10 mg Oral Q breakfast  . mouth rinse  15 mL Mouth Rinse BID  . mometasone-formoterol  2 puff Inhalation BID  .  multivitamin with minerals  1 tablet Oral Daily  . pravastatin  20 mg Oral q1800  . rifampin  600 mg Oral Daily  . thiamine  100 mg  Oral Daily   Continuous Infusions: . sodium chloride Stopped (05/23/19 0250)    LOS: 64 days   Merlene Laughtermair Latif Kenaz Olafson, DO [facility] PAGER is on AMION  If 7PM-7AM, please contact night-coverage [email or web address] Password [email or web address] 07/18/2019, 2:35 PM

## 2019-07-18 NOTE — Progress Notes (Addendum)
Pharmacy - Antimicrobial Stewardship  Case discussed with [facility] Dept. Today.  He is on Day 61 of pyrazinamide (PZA).  Per guidelines, this can be stopped after 56 doses (8 wks).  Confirmed with health dept ok to stop PZA today.  Since AFB remains positive he will need 7 more months (for total 73mo therapy) of rifampin + isoniazid + vit B1.   Plan: - stop PZA  Doreene Eland, PharmD, BCPS.   Work Cell: [phone] 07/18/2019 1:22 PM

## 2019-07-18 NOTE — TOC Progression Note (Signed)
Transition of Care [facility]) - Progression Note    Patient Details  Name: Clarence Maldonado MRN: [id number] Date of Birth: 1946/08/08  Transition of [facility]) CM/SW Contact  Purcell Mouton, RN Phone Number: 07/18/2019, 12:17 PM  Clinical Narrative:    WI:OXBDZ Narrative:  Is a 73 year old Caucasian male with a past medical history significant for but not limited to history of CVA, COPD, CKD stage III, tobacco abuse, diabetes mellitus type 2, hypertension as well as other comorbidities who presented with weakness and frequent falls and was found to have severe pulmonary tuberculosis.  Infectious diseases was consulted and he was started on treatment he is clinically improving on anti-TB treatment.  Hospitalization has been complicated by his general debility, cognitive deficits, as well as public health concerns given his acute pulmonary tuberculosis.  Last AFB from 07/04/19 was still positive for MTB.  Repeat ordered on 07/12/19 and was still positive for MTB.  PT OT recommended SNF but is not an option given his active pulmonary tuberculosis.  Currently he is improving and he is on day 39 of treatment.  Infectious disease is intermittently following and last saw the patient on 07/07/2019.  He still has a productive cough but states it is improved. Pt continues to participate with PT.      Expected Discharge Plan: [place] Barriers to Discharge: Continued Medical Work up  Expected Discharge Plan and Services Expected Discharge Plan: [place]   Discharge Planning Services: CM Consult   Living arrangements for the past 2 months: Mobile Home                                       Social Determinants of Health (SDOH) Interventions    Readmission Risk Interventions Readmission Risk Prevention Plan 05/23/2019  Transportation Screening Complete  PCP or Specialist Appt within 3-5 Days Not Complete  Not Complete comments Not ready for dc  [place]  or [place] Complete  Social Work Consult for [place] Planning/Counseling Not Complete  SW consult not completed comments nA  Palliative Care Screening Not Applicable  Medication Review Press photographer) Complete  Some recent data might be hidden

## 2019-07-19 LAB — CBC WITH DIFFERENTIAL/PLATELET
Abs Immature Granulocytes: 0.02 10*3/uL (ref 0.00–0.07)
Basophils Absolute: 0.1 10*3/uL (ref 0.0–0.1)
Basophils Relative: 1 %
Eosinophils Absolute: 0.2 10*3/uL (ref 0.0–0.5)
Eosinophils Relative: 3 %
HCT: 41.6 % (ref 39.0–52.0)
Hemoglobin: 12.9 g/dL — ABNORMAL LOW (ref 13.0–17.0)
Immature Granulocytes: 0 %
Lymphocytes Relative: 21 %
Lymphs Abs: 1.5 10*3/uL (ref 0.7–4.0)
MCH: 26.2 pg (ref 26.0–34.0)
MCHC: 31 g/dL (ref 30.0–36.0)
MCV: 84.6 fL (ref 80.0–100.0)
Monocytes Absolute: 1 10*3/uL (ref 0.1–1.0)
Monocytes Relative: 14 %
Neutro Abs: 4.3 10*3/uL (ref 1.7–7.7)
Neutrophils Relative %: 61 %
Platelets: 204 10*3/uL (ref 150–400)
RBC: 4.92 MIL/uL (ref 4.22–5.81)
RDW: 19 % — ABNORMAL HIGH (ref 11.5–15.5)
WBC: 7 10*3/uL (ref 4.0–10.5)
nRBC: 0 % (ref 0.0–0.2)

## 2019-07-19 LAB — COMPREHENSIVE METABOLIC PANEL
ALT: 12 U/L (ref 0–44)
AST: 17 U/L (ref 15–41)
Albumin: 2.9 g/dL — ABNORMAL LOW (ref 3.5–5.0)
Alkaline Phosphatase: 68 U/L (ref 38–126)
Anion gap: 10 (ref 5–15)
BUN: 35 mg/dL — ABNORMAL HIGH (ref 8–23)
CO2: 23 mmol/L (ref 22–32)
Calcium: 9.1 mg/dL (ref 8.9–10.3)
Chloride: 101 mmol/L (ref 98–111)
Creatinine, Ser: 1.29 mg/dL — ABNORMAL HIGH (ref 0.61–1.24)
GFR calc Af Amer: >60 mL/min (ref >60)
GFR calc non Af Amer: 55 mL/min — ABNORMAL LOW (ref >60)
Glucose, Bld: 99 mg/dL (ref 70–99)
Potassium: 3.9 mmol/L (ref 3.5–5.1)
Sodium: 134 mmol/L — ABNORMAL LOW (ref 135–145)
Total Bilirubin: 0.7 mg/dL (ref 0.3–1.2)
Total Protein: 6.5 g/dL (ref 6.5–8.1)

## 2019-07-19 LAB — PHOSPHORUS: Phosphorus: 3.8 mg/dL (ref 2.5–4.6)

## 2019-07-19 LAB — MAGNESIUM: Magnesium: 2.1 mg/dL (ref 1.7–2.4)

## 2019-07-19 NOTE — Progress Notes (Signed)
Physical Therapy Treatment Patient Details Name: Clarence Maldonado MRN: [id number] DOB: 19-May-1946 Today's Date: 07/19/2019    History of Present Illness 73 y.o. male with medical history significant of cardiomegaly, stroke, COPD,   CKD stage III, tobacco abuse, DM 2, HTN who presented to the emergency room on 8/10 with progressive weakness over the last 3 months to the point where he was falling over. He has lost approximately 43 pounds in the past 2 months. Pt found to have Pulmonary tuberculosis with cavitation    PT Comments    Pt ambulated 34' x 2 with RW, seated rest break between trials 2* 3/4 dyspnea, SaO2 80% on room air walking, HR 126, SaO2 94% on 4L O2 walking. Instructed pt in LE strengthening exercises. Decreased activity tolerance (decreased ambulation distance) today. Noted productive cough.   Follow Up Recommendations  Home health PT;Supervision/Assistance - 24 hour;SNF     Equipment Recommendations  Rolling walker with 5" wheels    Recommendations for Other Services       Precautions / Restrictions Precautions Precautions: Fall Precaution Comments: monitor O2 Restrictions Weight Bearing Restrictions: No    Mobility  Bed Mobility Overal bed mobility: Modified Independent                Transfers Overall transfer level: Needs assistance Equipment used: Rolling walker (2 wheeled);None Transfers: Sit to/from Stand Sit to Stand: Supervision         General transfer comment: increased time. cues for safety, technique. Stood a couple of times without RW support. Less steady but pt wanted to practice.  Ambulation/Gait Ambulation/Gait assistance: Min guard Gait Distance (Feet): 68 Feet(34' x 2 with seated rest break 2* 3/4 dyspnea) Assistive device: Rolling walker (2 wheeled) Gait Pattern/deviations: Step-through pattern;Decreased stride length;Trunk flexed Gait velocity: WFL   General Gait Details: ambulated 34'  x 2, SaO2 80% on room air, 94% on 4L  O2 [place], seated rest break between bouts of ambulation, 3/4 dyspnea limited ambulation distance, HR 126 walking   Stairs             Wheelchair Mobility    Modified Rankin (Stroke Patients Only)       Balance Overall balance assessment: Needs assistance Sitting-balance support: Feet supported Sitting balance-Leahy Scale: Good     Standing balance support: Bilateral upper extremity supported;No upper extremity supported Standing balance-Leahy Scale: Fair Standing balance comment: BUE support for dynamic standing tasks                            Cognition Arousal/Alertness: Awake/alert Behavior During Therapy: WFL for tasks assessed/performed Overall Cognitive Status: Within Functional Limits for tasks assessed                                        Exercises General Exercises - Lower Extremity Ankle Circles/Pumps: AROM;Both;10 reps;Seated Long Arc Quad: AROM;Both;10 reps;Seated Hip Flexion/Marching: AROM;Both;10 reps;Seated Heel Raises: AROM;Both;10 reps;Standing Mini-Sqauts: AROM;Both;10 reps;Standing    General Comments        Pertinent Vitals/Pain Pain Assessment: No/denies pain    Home Living                      Prior Function            PT Goals (current goals can now be found in the care plan section) Acute Rehab PT Goals  Patient Stated Goal: I want to get stronger so I can go home to my dog crystal PT Goal Formulation: With patient Time For Goal Achievement: 07/17/19 Potential to Achieve Goals: Good Progress towards PT goals: Not progressing toward goals - comment(decreased ambulation distance today)    Frequency    Min 3X/week      PT Plan Current plan remains appropriate    Co-evaluation              AM-PAC PT "6 Clicks" Mobility   Outcome Measure  Help needed turning from your back to your side while in a flat bed without using bedrails?: None Help needed moving from lying on your back  to sitting on the side of a flat bed without using bedrails?: None Help needed moving to and from a bed to a chair (including a wheelchair)?: A Little Help needed standing up from a chair using your arms (e.g., wheelchair or bedside chair)?: A Little Help needed to walk in hospital room?: A Little Help needed climbing 3-5 steps with a railing? : A Little 6 Click Score: 20    End of Session Equipment Utilized During Treatment: Oxygen Activity Tolerance: Patient limited by fatigue Patient left: in bed;with call bell/phone within reach Nurse Communication: Mobility status PT Visit Diagnosis: Muscle weakness (generalized) (M62.81);Difficulty in walking, not elsewhere classified (R26.2)     Time: 4656-8127 PT Time Calculation (min) (ACUTE ONLY): 25 min  Charges:  $Gait Training: 8-22 mins $Therapeutic Exercise: 8-22 mins                     Blondell Reveal Kistler PT 07/19/2019  Acute Rehabilitation Services Pager [phone] Office [phone]

## 2019-07-19 NOTE — Progress Notes (Signed)
PROGRESS NOTE    Clarence Maldonado  IHW:[id number] DOB: 09-Feb-1946 DOA: 05/15/2019 PCP: [name], DO   Brief Narrative: Clarence Maldonado is a 73 y.o. male with history of stroke, COPD, CKD stage III, tobacco use, diabetes mellitus type 2, hypertension. Patient presented secondary to weakness and frequent falls, found to have pulmonary TB.   Assessment & Plan:   Principal Problem:   Pulmonary tuberculosis with cavitation Active Problems:   Essential hypertension   CKD (chronic kidney disease) stage 3, GFR 30-59 ml/min (HCC)   Hyperlipidemia   Tobacco use disorder   Controlled type 2 diabetes mellitus with stage 3 chronic kidney disease, without long-term current use of insulin (HCC)   Weight loss   Hyperlipidemia associated with type 2 diabetes mellitus (HCC)   Anemia   Moderate protein-calorie malnutrition (HCC)   Pulmonary TB Started on RIPE therapy per ID. Most recent AFB cuture (10/13) positive. Ethambutol and pyrazinamide discontinued -Continue Rifampin, isoniazid  Dysphagia Evaluated by speech therapy. MBS significant for evidence of moderate oropharyngeal dysphagia with sensorimotor deficit and hypomotility of the pharynx. Patient deemed a high aspiration risk initially and now upgraded to a regular diet. Resolved.  Peripheral neuropathy Concern this could be related to RIPE therapy. Started on B6. Improved.  Heart mass Seen on left and right atria. Recommendation for outpatient cardiology follow-up. -May need to obtain Transesophageal Echocardiogram since patient has been in hospital for so long  Non-sustained V-tach Stable. Hypokalemia and magnesium likely contributed.  Hypokalemia/Hypomagnesemia Resolved.  Iron deficiency anemia -Continue iron supplementation  Diabetes mellitus, type 2 -Continue SSI  Weight loss Moderate malnutrition -Continue supplementation  CKD stage III Stable   DVT prophylaxis: Lovenox Code Status:   Code Status: Full  Code Family Communication: None Disposition Plan: Discharge pending safe discharge plan   Consultants:   ID  Procedures:   None  Antimicrobials:  Rifampin, isoniazid, pyrazinamide, ethambutol   Subjective: No issues today  Objective: Vitals:   07/19/19 0500 07/19/19 0644 07/19/19 1411 07/19/19 1456  BP:  (!) 131/59  132/66  Pulse:  89 (!) 126 93  Resp:  20  20  Temp:  98.6 F (37 C)  97.7 F (36.5 C)  TempSrc:  Oral  Oral  SpO2:  94% (!) 80% 98%  Weight: 60.2 kg     Height:        Intake/Output Summary (Last 24 hours) at 07/19/2019 1833 Last data filed at 07/19/2019 1700 Gross per 24 hour  Intake 840 ml  Output 1125 ml  Net -285 ml   Filed Weights   07/15/19 0500 07/16/19 0515 07/19/19 0500  Weight: 62 kg 11.4 kg 60.2 kg    Examination:  General exam: Appears calm and comfortable  Respiratory system: Diminished bilaterally Respiratory effort normal. Cardiovascular system: S1 & S2 heard, RRR. No murmurs, rubs, gallops or clicks. Gastrointestinal system: Abdomen is nondistended, soft and nontender. No organomegaly or masses felt. Normal bowel sounds heard. Central nervous system: Alert and oriented. No focal neurological deficits. Extremities: No edema. No calf tenderness Skin: No cyanosis. No rashes Psychiatry: Judgement and insight appear normal. Mood & affect appropriate.    Data Reviewed: I have personally reviewed following labs and imaging studies  CBC: Recent Labs  Lab 07/15/19 0520 07/19/19 0511  WBC 6.5 7.0  NEUTROABS 4.1 4.3  HGB 12.9* 12.9*  HCT 41.2 41.6  MCV 85.7 84.6  PLT 230 204   Basic Metabolic Panel: Recent Labs  Lab 07/15/19 0520 07/19/19 0511  NA  134* 134*  K 4.1 3.9  CL 100 101  CO2 22 23  GLUCOSE 103* 99  BUN 41* 35*  CREATININE 1.32* 1.29*  CALCIUM 9.7 9.1  MG 2.3 2.1  PHOS 5.0* 3.8   GFR: Estimated Creatinine Clearance: 44.1 mL/min (A) (by C-G formula based on SCr of 1.29 mg/dL (H)). Liver Function  Tests: Recent Labs  Lab 07/15/19 0520 07/19/19 0511  AST 20 17  ALT 12 12  ALKPHOS 68 68  BILITOT 0.8 0.7  PROT 6.9 6.5  ALBUMIN 3.2* 2.9*   No results for input(s): LIPASE, AMYLASE in the last 168 hours. No results for input(s): AMMONIA in the last 168 hours. Coagulation Profile: No results for input(s): INR, PROTIME in the last 168 hours. Cardiac Enzymes: No results for input(s): CKTOTAL, CKMB, CKMBINDEX, TROPONINI in the last 168 hours. BNP (last 3 results) No results for input(s): PROBNP in the last 8760 hours. HbA1C: No results for input(s): HGBA1C in the last 72 hours. CBG: Recent Labs  Lab 07/16/19 0512 07/17/19 0812  GLUCAP 142* 104*   Lipid Profile: No results for input(s): CHOL, HDL, LDLCALC, TRIG, CHOLHDL, LDLDIRECT in the last 72 hours. Thyroid Function Tests: No results for input(s): TSH, T4TOTAL, FREET4, T3FREE, THYROIDAB in the last 72 hours. Anemia Panel: No results for input(s): VITAMINB12, FOLATE, FERRITIN, TIBC, IRON, RETICCTPCT in the last 72 hours. Sepsis Labs: No results for input(s): PROCALCITON, LATICACIDVEN in the last 168 hours.  Recent Results (from the past 240 hour(s))  Acid Fast Smear (AFB)     Status: None   Collection Time: 07/12/19 12:43 PM   Specimen: Sputum  Result Value Ref Range Status   AFB Specimen Processing Concentration  Final   Acid Fast Smear Positive  Final    Comment: CRITICAL RESULT CALLED TO, READ BACK BY AND VERIFIED WITH: Jerrye Noble RN [id number] 07/13/19 A BROWNING (NOTE) 3+, 4-36 acid-fast bacilli per field at 400X magnification, fluorescent smear REPORTED RESULTS ON 07-13-19 AT 1526. FAX CONFORMATION TO [phone] Performed At: [facility] [address] [place], [place] [id number] Rush Farmer MD KG:[id number] Performed at [facility], [place] [address], [place], [place] [address]    Source (AFB) SPUTUM  Final    Comment: Performed at [facility], [place] [address]  [place], [place] [address]  Acid Fast Smear (AFB)     Status: Abnormal   Collection Time: 07/18/19  7:04 PM   Specimen: Sputum  Result Value Ref Range Status   AFB Specimen Processing Concentration  Final   Acid Fast Smear Positive (A)  Final    Comment: (NOTE) 2+, 4-36 acid-fast bacilli per 10 fields at 400X magnification, fluorescent smear REPOTED RESULTS TO NATHAN T ON 07-19-19 AT 1633 FAX CONFORMATION TO [phone] Performed At: [facility] [place], [place] [id number] Rush Farmer MD [id number]    Source (AFB) SPUTUM  Final    Comment: Performed at [facility], [place] [address], [place], [place] [address]         Radiology Studies: No results found.      Scheduled Meds: . buPROPion  150 mg Oral Daily  . clotrimazole   Topical BID  . enoxaparin (LOVENOX) injection  40 mg Subcutaneous Q24H  . famotidine  20 mg Oral Daily  . feeding supplement (ENSURE ENLIVE)  237 mL Oral Q24H  . ferrous sulfate  325 mg Oral BID WC  . isoniazid  300 mg Oral Daily   And  . vitamin  B-6  50 mg Oral Daily  . loratadine  10 mg Oral Q breakfast  . mouth rinse  15 mL Mouth Rinse BID  . mometasone-formoterol  2 puff Inhalation BID  . multivitamin with minerals  1 tablet Oral Daily  . pravastatin  20 mg Oral q1800  . rifampin  600 mg Oral Daily  . thiamine  100 mg Oral Daily   Continuous Infusions: . sodium chloride Stopped (05/23/19 0250)     LOS: 65 days     Jacquelin Hawking, MD [facility] 07/19/2019, 6:33 PM  If 7PM-7AM, please contact night-coverage [email or web address]

## 2019-07-19 NOTE — Plan of Care (Signed)
  Problem: Clinical Measurements: Goal: Will remain free from infection Outcome: Progressing Note: Oral abx for TB.  Goal: Diagnostic test results will improve Outcome: Progressing Goal: Respiratory complications will improve Outcome: Progressing   Problem: Activity: Goal: Risk for activity intolerance will decrease Outcome: Progressing   Problem: Safety: Goal: Ability to remain free from injury will improve Outcome: Progressing

## 2019-07-20 MED ORDER — METOPROLOL TARTRATE 5 MG/5ML IV SOLN
5.0000 mg | Freq: Once | INTRAVENOUS | Status: AC | PRN
  Administered 2019-07-20: 22:00:00 5 mg via INTRAVENOUS
  Filled 2019-07-20: qty 5

## 2019-07-20 NOTE — Progress Notes (Signed)
PROGRESS NOTE    Clarence Maldonado  XTK:[id number] DOB: 1946-01-25 DOA: 05/15/2019 PCP: [name], DO   Brief Narrative: Clarence Maldonado is a 73 y.o. male with history of stroke, COPD, CKD stage III, tobacco use, diabetes mellitus type 2, hypertension. Patient presented secondary to weakness and frequent falls, found to have pulmonary TB.   Assessment & Plan:   Principal Problem:   Pulmonary tuberculosis with cavitation Active Problems:   Essential hypertension   CKD (chronic kidney disease) stage 3, GFR 30-59 ml/min (HCC)   Hyperlipidemia   Tobacco use disorder   Controlled type 2 diabetes mellitus with stage 3 chronic kidney disease, without long-term current use of insulin (HCC)   Weight loss   Hyperlipidemia associated with type 2 diabetes mellitus (HCC)   Anemia   Moderate protein-calorie malnutrition (HCC)   Pulmonary TB Started on RIPE therapy per ID. Most recent AFB cuture (10/13) positive. Ethambutol and pyrazinamide discontinued -Continue Rifampin, isoniazid  Dysphagia Evaluated by speech therapy. MBS significant for evidence of moderate oropharyngeal dysphagia with sensorimotor deficit and hypomotility of the pharynx. Patient deemed a high aspiration risk initially and now upgraded to a regular diet. Resolved.  Peripheral neuropathy Concern this could be related to RIPE therapy. Started on B6. Improved.  Heart mass Seen on left and right atria. Recommendation for outpatient cardiology follow-up. -May need to obtain Transesophageal Echocardiogram since patient has been in hospital for so long  Non-sustained V-tach Stable. Hypokalemia and magnesium likely contributed.  Hypokalemia/Hypomagnesemia Resolved.  Iron deficiency anemia -Continue iron supplementation  Diabetes mellitus, type 2 -Continue SSI  Weight loss Moderate malnutrition -Continue supplementation  CKD stage III Stable   DVT prophylaxis: Lovenox Code Status:   Code Status: Full  Code Family Communication: None Disposition Plan: Discharge pending safe discharge plan   Consultants:   ID  Procedures:   None  Antimicrobials:  Rifampin, isoniazid, pyrazinamide, ethambutol   Subjective: No concerns. Some cough with small amount of clear/white sputum. No hemoptysis.  Objective: Vitals:   07/19/19 1456 07/19/19 2109 07/20/19 0546 07/20/19 0600  BP: 132/66 (!) 142/92 130/74   Pulse: 93 (!) 102 91   Resp: 20 20 20    Temp: 97.7 F (36.5 C) 98.1 F (36.7 C) 98.2 F (36.8 C)   TempSrc: Oral Oral    SpO2: 98% 95% 93%   Weight:    21.2 kg  Height:        Intake/Output Summary (Last 24 hours) at 07/20/2019 0927 Last data filed at 07/20/2019 0500 Gross per 24 hour  Intake 840 ml  Output 1300 ml  Net -460 ml   Filed Weights   07/15/19 0500 07/16/19 0515 07/20/19 0600  Weight: 62 kg 11.4 kg 21.2 kg    Examination:  General exam: Appears calm and comfortable Respiratory system: Clear and diminished to auscultation. Respiratory effort normal. Cardiovascular system: S1 & S2 heard, RRR. No murmurs, rubs, gallops or clicks.    Data Reviewed: I have personally reviewed following labs and imaging studies  CBC: Recent Labs  Lab 07/15/19 0520 07/19/19 0511  WBC 6.5 7.0  NEUTROABS 4.1 4.3  HGB 12.9* 12.9*  HCT 41.2 41.6  MCV 85.7 84.6  PLT 230 204   Basic Metabolic Panel: Recent Labs  Lab 07/15/19 0520 07/19/19 0511  NA 134* 134*  K 4.1 3.9  CL 100 101  CO2 22 23  GLUCOSE 103* 99  BUN 41* 35*  CREATININE 1.32* 1.29*  CALCIUM 9.7 9.1  MG 2.3 2.1  PHOS 5.0* 3.8   GFR: Estimated Creatinine Clearance: 15.5 mL/min (A) (by C-G formula based on SCr of 1.29 mg/dL (H)). Liver Function Tests: Recent Labs  Lab 07/15/19 0520 07/19/19 0511  AST 20 17  ALT 12 12  ALKPHOS 68 68  BILITOT 0.8 0.7  PROT 6.9 6.5  ALBUMIN 3.2* 2.9*   No results for input(s): LIPASE, AMYLASE in the last 168 hours. No results for input(s): AMMONIA in the  last 168 hours. Coagulation Profile: No results for input(s): INR, PROTIME in the last 168 hours. Cardiac Enzymes: No results for input(s): CKTOTAL, CKMB, CKMBINDEX, TROPONINI in the last 168 hours. BNP (last 3 results) No results for input(s): PROBNP in the last 8760 hours. HbA1C: No results for input(s): HGBA1C in the last 72 hours. CBG: Recent Labs  Lab 07/16/19 0512 07/17/19 0812  GLUCAP 142* 104*   Lipid Profile: No results for input(s): CHOL, HDL, LDLCALC, TRIG, CHOLHDL, LDLDIRECT in the last 72 hours. Thyroid Function Tests: No results for input(s): TSH, T4TOTAL, FREET4, T3FREE, THYROIDAB in the last 72 hours. Anemia Panel: No results for input(s): VITAMINB12, FOLATE, FERRITIN, TIBC, IRON, RETICCTPCT in the last 72 hours. Sepsis Labs: No results for input(s): PROCALCITON, LATICACIDVEN in the last 168 hours.  Recent Results (from the past 240 hour(s))  Acid Fast Smear (AFB)     Status: None   Collection Time: 07/12/19 12:43 PM   Specimen: Sputum  Result Value Ref Range Status   AFB Specimen Processing Concentration  Final   Acid Fast Smear Positive  Final    Comment: CRITICAL RESULT CALLED TO, READ BACK BY AND VERIFIED WITH: Jerrye Noble RN [id number] 07/13/19 A BROWNING (NOTE) 3+, 4-36 acid-fast bacilli per field at 400X magnification, fluorescent smear REPORTED RESULTS ON 07-13-19 AT 1526. FAX CONFORMATION TO [phone] Performed At: [facility] [address] [place], [place] [id number] Rush Farmer MD [id number] Performed at [facility], [place] [address], [place], [place] [address]    Source (AFB) SPUTUM  Final    Comment: Performed at [facility], [place] [address], [place], [place] [address]  Acid Fast Smear (AFB)     Status: Abnormal   Collection Time: 07/18/19  7:04 PM   Specimen: Sputum  Result Value Ref Range Status   AFB Specimen Processing Concentration  Final   Acid Fast Smear Positive (A)  Final    Comment: (NOTE)  2+, 4-36 acid-fast bacilli per 10 fields at 400X magnification, fluorescent smear REPOTED RESULTS TO NATHAN T ON 07-19-19 AT 1633 FAX CONFORMATION TO [phone] Performed At: [facility] [place], [place] [id number] Rush Farmer MD [id number]    Source (AFB) SPUTUM  Final    Comment: Performed at [facility], [place] [address], [place], [place] [address]         Radiology Studies: No results found.      Scheduled Meds: . buPROPion  150 mg Oral Daily  . clotrimazole   Topical BID  . enoxaparin (LOVENOX) injection  40 mg Subcutaneous Q24H  . famotidine  20 mg Oral Daily  . feeding supplement (ENSURE ENLIVE)  237 mL Oral Q24H  . ferrous sulfate  325 mg Oral BID WC  . isoniazid  300 mg Oral Daily   And  . vitamin B-6  50 mg Oral Daily  . loratadine  10 mg Oral Q breakfast  . mouth rinse  15 mL Mouth Rinse BID  . mometasone-formoterol  2 puff Inhalation BID  .  multivitamin with minerals  1 tablet Oral Daily  . pravastatin  20 mg Oral q1800  . rifampin  600 mg Oral Daily  . thiamine  100 mg Oral Daily   Continuous Infusions: . sodium chloride Stopped (05/23/19 0250)     LOS: 66 days     Jacquelin Hawking, MD [facility] 07/20/2019, 9:27 AM  If 7PM-7AM, please contact night-coverage [email or web address]

## 2019-07-20 NOTE — Plan of Care (Signed)
  Problem: Clinical Measurements: Goal: Will remain free from infection Outcome: Progressing Goal: Diagnostic test results will improve Outcome: Progressing Goal: Respiratory complications will improve Outcome: Progressing   Problem: Activity: Goal: Risk for activity intolerance will decrease Outcome: Progressing   Problem: Safety: Goal: Ability to remain free from injury will improve Outcome: Progressing   

## 2019-07-20 NOTE — Progress Notes (Signed)
    [place] for Infectious Disease   Reason for visit: Follow up on Tb  Interval History:   Physical Exam: Constitutional:  Vitals:   07/19/19 2109 07/20/19 0546  BP: (!) 142/92 130/74  Pulse: (!) 102 91  Resp: 20 20  Temp: 98.1 F (36.7 C) 98.2 F (36.8 C)  SpO2: 95% 93%   patient appears in NAD  Impression: Stable Tb  Plan: Continues to have AFB smear positive.  HD doing to check levels after discharge.  Discharge pending tomorrow after drug administration here.  They will meet the patient at his home tomorrow afternoon for the weekend doses.   He will continue to follow with the HD Tb program.

## 2019-07-20 NOTE — Progress Notes (Signed)
Pt lying in bed, resting. Temp 100.1 orally. HR 120's. Denies pain. States he was nauseated earlier but denies at present. Dr Lonny Prude notified. Await any new orders.

## 2019-07-20 NOTE — TOC Progression Note (Addendum)
Transition of Care [facility]) - Progression Note    Patient Details  Name: Clarence Maldonado MRN: [id number] Date of Birth: 09/16/1946  Transition of Care [facility]) CM/SW Contact  Purcell Mouton, RN Phone Number: 07/20/2019, 1:15 PM  Clinical Narrative:    Spoke with attending concerning ID note on 10/2 that pt could go home with self isolation. If this is true pt could go home with [facility]. A call was made to [facility] [place] who referred this CM to  Health department.  Spoke with Alda Berthold, [place] at Health Department concerning pt's discharge and follow up. Kisha checked on pt's [place] with her superior. [place] is not condemned, it is livable and pt may discharge back to [place].  Health Department will need a 48 hours notice in order to order medications for pt.  Alvis Lemmings [facility] will follow pt at home for HHRN/PT/OT.   Expected Discharge Plan: [place] Barriers to Discharge: Continued Medical Work up  Expected Discharge Plan and Services Expected Discharge Plan: [place]   Discharge Planning Services: CM Consult   Living arrangements for the past 2 months: Mobile Home                                       Social Determinants of Health (SDOH) Interventions    Readmission Risk Interventions Readmission Risk Prevention Plan 05/23/2019  Transportation Screening Complete  PCP or Specialist Appt within 3-5 Days Not Complete  Not Complete comments Not ready for dc  HRI or [place] Complete  Social Work Consult for [place] Planning/Counseling Not Complete  SW consult not completed comments nA  Palliative Care Screening Not Applicable  Medication Review Press photographer) Complete  Some recent data might be hidden

## 2019-07-21 ENCOUNTER — Inpatient Hospital Stay (HOSPITAL_COMMUNITY): Payer: Medicare Other

## 2019-07-21 DIAGNOSIS — R509 Fever, unspecified: Secondary | ICD-10-CM

## 2019-07-21 NOTE — Progress Notes (Signed)
Physical Therapy Treatment Patient Details Name: Clarence Maldonado MRN: [id number] DOB: 11-06-1945 Today's Date: 07/21/2019    History of Present Illness 73 y.o. male with medical history significant of cardiomegaly, stroke, COPD,   CKD stage III, tobacco abuse, DM 2, HTN who presented to the emergency room on 8/10 with progressive weakness over the last 3 months to the point where he was falling over. He has lost approximately 43 pounds in the past 2 months. Pt found to have Pulmonary tuberculosis with cavitation    PT Comments    Pt continues to need o2 for mobility.  He did verbalize understanding and recall from previous PTs on the importance of using his RW for mobility.   Follow Up Recommendations  Home health PT;Supervision/Assistance - 24 hour;SNF     Equipment Recommendations  Rolling walker with 5" wheels    Recommendations for Other Services       Precautions / Restrictions Precautions Precautions: Fall Precaution Comments: monitor O2 Restrictions Weight Bearing Restrictions: No    Mobility  Bed Mobility               General bed mobility comments: sitting EOB upon arrival  Transfers Overall transfer level: Needs assistance Equipment used: Rolling walker (2 wheeled) Transfers: Sit to/from Stand Sit to Stand: Supervision Stand pivot transfers: Supervision       General transfer comment: S for safety  Ambulation/Gait Ambulation/Gait assistance: Min guard;Supervision Gait Distance (Feet): 42 Feet Assistive device: Rolling walker (2 wheeled) Gait Pattern/deviations: Step-through pattern;Decreased stride length;Trunk flexed Gait velocity: WFL   General Gait Details: Amb in room with RW, but o2 monitor not reading, so returned to EOB and worked on pursed lip breathing and o2 donned.   Stairs             Wheelchair Mobility    Modified Rankin (Stroke Patients Only)       Balance   Sitting-balance support: Feet supported Sitting  balance-Leahy Scale: Good     Standing balance support: Bilateral upper extremity supported Standing balance-Leahy Scale: Fair Standing balance comment: BUE support for dynamic standing tasks                            Cognition Arousal/Alertness: Awake/alert Behavior During Therapy: WFL for tasks assessed/performed Overall Cognitive Status: Within Functional Limits for tasks assessed                                        Exercises General Exercises - Lower Extremity Hip Flexion/Marching: AROM;Both;10 reps;Standing Heel Raises: AROM;Both;10 reps;Standing Mini-Sqauts: AROM;Both;10 reps;Standing    General Comments General comments (skin integrity, edema, etc.): O2 intact for standing therex and pulse ox not giving consistent clear reading and jumping from 83% to 89%. At end of session it was 92%      Pertinent Vitals/Pain Pain Assessment: No/denies pain    Home Living                      Prior Function            PT Goals (current goals can now be found in the care plan section) Acute Rehab PT Goals Potential to Achieve Goals: Good Progress towards PT goals: Progressing toward goals    Frequency    Min 3X/week      PT Plan Current plan remains appropriate  Co-evaluation              AM-PAC PT "6 Clicks" Mobility   Outcome Measure  Help needed turning from your back to your side while in a flat bed without using bedrails?: None Help needed moving from lying on your back to sitting on the side of a flat bed without using bedrails?: None Help needed moving to and from a bed to a chair (including a wheelchair)?: A Little Help needed standing up from a chair using your arms (e.g., wheelchair or bedside chair)?: A Little Help needed to walk in hospital room?: A Little Help needed climbing 3-5 steps with a railing? : A Little 6 Click Score: 20    End of Session Equipment Utilized During Treatment: Oxygen Activity  Tolerance: Patient tolerated treatment well;Patient limited by fatigue Patient left: in bed;with call bell/phone within reach   PT Visit Diagnosis: Muscle weakness (generalized) (M62.81);Difficulty in walking, not elsewhere classified (R26.2)     Time: 5277-8242 PT Time Calculation (min) (ACUTE ONLY): 30 min  Charges:  $Gait Training: 8-22 mins $Therapeutic Exercise: 8-22 mins                     Yoon Barca L. Katrinka Blazing, [place] Pager [phone] 07/21/2019    [name] 07/21/2019, 3:15 PM

## 2019-07-21 NOTE — Progress Notes (Signed)
   07/20/19 2019  Vitals  Temp (!) 102.3 F (39.1 C)  Temp Source Oral  BP 135/70  MAP (mmHg) 88  BP Location Left Arm  BP Method Automatic  Patient Position (if appropriate) Lying  Pulse Rate (!) 129  Pulse Rate Source Monitor  Resp 20  Oxygen Therapy  SpO2 94 %  O2 Device Nasal Cannula  FiO2 (%) (!) 2 %  MEWS Score  MEWS RR 0  MEWS Pulse 2  MEWS Systolic 0  MEWS LOC 0  MEWS Temp 2  MEWS Score 4  MEWS Score Color Red  MEWS Assessment  Is this an acute change? Yes  MEWS guidelines implemented *See Row Information* Red  MD aware and order already in from yellow mews to give Lopressor 5 mg IV

## 2019-07-21 NOTE — TOC Progression Note (Signed)
Transition of Care [facility]) - Progression Note    Patient Details  Name: Clarence Maldonado MRN: [id number] Date of Birth: [birth year]  Transition of Care [facility]) CM/SW Contact  Purcell Mouton, RN Phone Number: 07/21/2019, 9:34 AM  Clinical Narrative:    Pt's temp 102.5 during the night. Hopefully will plan on discharge Monday.   Expected Discharge Plan: [place] Barriers to Discharge: Continued Medical Work up  Expected Discharge Plan and Services Expected Discharge Plan: [place]   Discharge Planning Services: CM Consult   Living arrangements for the past 2 months: Mobile Home                                       Social Determinants of Health (SDOH) Interventions    Readmission Risk Interventions Readmission Risk Prevention Plan 05/23/2019  Transportation Screening Complete  PCP or Specialist Appt within 3-5 Days Not Complete  Not Complete comments Not ready for dc  HRI or [place] Complete  Social Work Consult for [place] Planning/Counseling Not Complete  SW consult not completed comments nA  Palliative Care Screening Not Applicable  Medication Review Press photographer) Complete  Some recent data might be hidden

## 2019-07-21 NOTE — Progress Notes (Signed)
Occupational Therapy Treatment Patient Details Name: Clarence Maldonado MRN: [id number] DOB: Dec 09, 1945 Today's Date: 07/21/2019    History of present illness 73 y.o. male with medical history significant of cardiomegaly, stroke, COPD,   CKD stage III, tobacco abuse, DM 2, HTN who presented to the emergency room on 8/10 with progressive weakness over the last 3 months to the point where he was falling over. He has lost approximately 43 pounds in the past 2 months. Pt found to have Pulmonary tuberculosis with cavitation   OT comments  Per notes, pt may d/c home Monday.  Recommend 3:1 commode and HHOT, if they tx pts with TB.  Pt is mostly at a supervision level for adls. He has AE and can benefit from long sponge for back and reacher for retrieving items.  Pt will need to pace himself as he fatiques easily.  HHOT can help with this as well as IADLs.  Follow Up Recommendations  Home health OT;Supervision/Assistance - 24 hour    Equipment Recommendations  3 in 1 bedside commode    Recommendations for Other Services      Precautions / Restrictions Precautions Precautions: Fall Precaution Comments: monitor O2 Restrictions Weight Bearing Restrictions: No       Mobility Bed Mobility               General bed mobility comments: sitting EOB upon arrival  Transfers Overall transfer level: Needs assistance Equipment used: Rolling walker (2 wheeled) Transfers: Sit to/from Stand Sit to Stand: Supervision Stand pivot transfers: Supervision       General transfer comment: S for safety    Balance   Sitting-balance support: Feet supported Sitting balance-Leahy Scale: Good     Standing balance support: Bilateral upper extremity supported Standing balance-Leahy Scale: Fair Standing balance comment: BUE support for dynamic standing tasks                           ADL either performed or assessed with clinical judgement   ADL                                          General ADL Comments: Pt did not want to perform whole adl at this time.  Pt has been getting in shower.  Worked on components:  he is able to cross legs for adls and no longer needs AE, but he can use long sponge for back and use reacher to retrieve items for safety. Pt is not sure his toilet flushes:  it was supposed to get fixed. Recommend 3:1 for over toilet (or on it's own if toilet doesn't work).  Reinforced energy conservation and not pushing through when he is fatiqued.  He has a tub, and he is agreeable to sponge bathing if needed. Overall supervision for adls at this time     Vision       Perception     Praxis      Cognition Arousal/Alertness: Awake/alert Behavior During Therapy: WFL for tasks assessed/performed Overall Cognitive Status: Within Functional Limits for tasks assessed                                          Exercises Exercises: (pt is working on [employer] on his own; handouts provided)  General Exercises - Lower Extremity Hip Flexion/Marching: AROM;Both;10 reps;Standing Heel Raises: AROM;Both;10 reps;Standing Mini-Sqauts: AROM;Both;10 reps;Standing   Shoulder Instructions       General Comments O2 intact for standing therex and pulse ox not giving consistent clear reading and jumping from 83% to 89%. At end of session it was 92%    Pertinent Vitals/ Pain       Pain Assessment: No/denies pain  Home Living                                          Prior Functioning/Environment              Frequency  Min 2X/week        Progress Toward Goals  OT Goals(current goals can now be found in the care plan section)  Progress towards OT goals: Progressing toward goals     Plan      Co-evaluation                 AM-PAC OT "6 Clicks" Daily Activity     Outcome Measure   Help from another person eating meals?: None Help from another person taking care of personal grooming?: A Little Help  from another person toileting, which includes using toliet, bedpan, or urinal?: A Little Help from another person bathing (including washing, rinsing, drying)?: A Little Help from another person to put on and taking off regular upper body clothing?: A Little Help from another person to put on and taking off regular lower body clothing?: A Little 6 Click Score: 19    End of Session    OT Visit Diagnosis: Muscle weakness (generalized) (M62.81);Unsteadiness on feet (R26.81)   Activity Tolerance     Patient Left     Nurse Communication          Time: 1610-9604 OT Time Calculation (min): 17 min  Charges: OT General Charges $OT Visit: 1 Visit OT Treatments $Self Care/Home Management : 8-22 mins  [name], OTR/L Acute Rehabilitation Services [phone] [facility] pager [phone] office 07/21/2019   Lestat Golob 07/21/2019, 3:27 PM

## 2019-07-21 NOTE — Progress Notes (Signed)
PROGRESS NOTE    Clarence Maldonado  AUQ:[id number] DOB: 1946/01/03 DOA: 05/15/2019 PCP: [name], DO   Brief Narrative: Clarence Maldonado is a 73 y.o. male with history of stroke, COPD, CKD stage III, tobacco use, diabetes mellitus type 2, hypertension. Patient presented secondary to weakness and frequent falls, found to have pulmonary TB.   Assessment & Plan:   Principal Problem:   Pulmonary tuberculosis with cavitation Active Problems:   Essential hypertension   CKD (chronic kidney disease) stage 3, GFR 30-59 ml/min (HCC)   Hyperlipidemia   Tobacco use disorder   Controlled type 2 diabetes mellitus with stage 3 chronic kidney disease, without long-term current use of insulin (HCC)   Weight loss   Hyperlipidemia associated with type 2 diabetes mellitus (HCC)   Anemia   Moderate protein-calorie malnutrition (HCC)   Pulmonary TB Started on RIPE therapy per ID. Most recent AFB cuture (10/13) positive. Ethambutol and pyrazinamide discontinued -Continue Rifampin, isoniazid  Fever Tmax of 102.5 F overnight. Unknown source. Patient is asymptomatic -Blood and urine cultures obtained and are pending -Chest x-ray  Dysphagia Evaluated by speech therapy. MBS significant for evidence of moderate oropharyngeal dysphagia with sensorimotor deficit and hypomotility of the pharynx. Patient deemed a high aspiration risk initially and now upgraded to a regular diet. Resolved.  Peripheral neuropathy Concern this could be related to RIPE therapy. Started on B6. Improved.  Heart mass Seen on left and right atria. Recommendation for outpatient cardiology follow-up. -May need to obtain Transesophageal Echocardiogram since patient has been in hospital for so long  Non-sustained V-tach Stable. Hypokalemia and magnesium likely contributed.  Hypokalemia/Hypomagnesemia Resolved.  Iron deficiency anemia -Continue iron supplementation  Diabetes mellitus, type 2 -Continue SSI  Weight  loss Moderate malnutrition -Continue supplementation  CKD stage III Stable   DVT prophylaxis: Lovenox Code Status:   Code Status: Full Code Family Communication: None Disposition Plan: Discharge pending safe discharge plan, likely in 3 days if fever workup is unremarkable   Consultants:   ID  Procedures:   None  Antimicrobials:  Rifampin, isoniazid, pyrazinamide, ethambutol   Subjective: Productive cough. No other issues  Objective: Vitals:   07/21/19 0355 07/21/19 0910 07/21/19 0940 07/21/19 1151  BP: 115/62 111/67  129/64  Pulse: 83 (!) 122 (!) 116 98  Resp: 18 19  18   Temp: 97.7 F (36.5 C) 97.9 F (36.6 C)  97.8 F (36.6 C)  TempSrc: Oral Oral  Oral  SpO2: 91% 97% 96% 95%  Weight:      Height:        Intake/Output Summary (Last 24 hours) at 07/21/2019 1409 Last data filed at 07/21/2019 0600 Gross per 24 hour  Intake 600 ml  Output 200 ml  Net 400 ml   Filed Weights   07/16/19 0515 07/20/19 0600 07/21/19 0237  Weight: 11.4 kg 21.2 kg 18.6 kg    Examination:  General exam: Appears calm and comfortable Respiratory system: Clear to auscultation. Respiratory effort normal. Cardiovascular system: S1 & S2 heard, RRR. No murmurs, rubs, gallops or clicks. Gastrointestinal system: Abdomen is nondistended, soft and nontender. No organomegaly or masses felt. Normal bowel sounds heard. Central nervous system: Alert and oriented. No focal neurological deficits. Extremities: No edema. No calf tenderness Skin: No cyanosis. No rashes Psychiatry: Judgement and insight appear normal. Mood & affect appropriate.   Data Reviewed: I have personally reviewed following labs and imaging studies  CBC: Recent Labs  Lab 07/15/19 0520 07/19/19 0511  WBC 6.5 7.0  NEUTROABS  4.1 4.3  HGB 12.9* 12.9*  HCT 41.2 41.6  MCV 85.7 84.6  PLT 230 204   Basic Metabolic Panel: Recent Labs  Lab 07/15/19 0520 07/19/19 0511  NA 134* 134*  K 4.1 3.9  CL 100 101  CO2 22  23  GLUCOSE 103* 99  BUN 41* 35*  CREATININE 1.32* 1.29*  CALCIUM 9.7 9.1  MG 2.3 2.1  PHOS 5.0* 3.8   GFR: Estimated Creatinine Clearance: 13.6 mL/min (A) (by C-G formula based on SCr of 1.29 mg/dL (H)). Liver Function Tests: Recent Labs  Lab 07/15/19 0520 07/19/19 0511  AST 20 17  ALT 12 12  ALKPHOS 68 68  BILITOT 0.8 0.7  PROT 6.9 6.5  ALBUMIN 3.2* 2.9*   No results for input(s): LIPASE, AMYLASE in the last 168 hours. No results for input(s): AMMONIA in the last 168 hours. Coagulation Profile: No results for input(s): INR, PROTIME in the last 168 hours. Cardiac Enzymes: No results for input(s): CKTOTAL, CKMB, CKMBINDEX, TROPONINI in the last 168 hours. BNP (last 3 results) No results for input(s): PROBNP in the last 8760 hours. HbA1C: No results for input(s): HGBA1C in the last 72 hours. CBG: Recent Labs  Lab 07/16/19 0512 07/17/19 0812  GLUCAP 142* 104*   Lipid Profile: No results for input(s): CHOL, HDL, LDLCALC, TRIG, CHOLHDL, LDLDIRECT in the last 72 hours. Thyroid Function Tests: No results for input(s): TSH, T4TOTAL, FREET4, T3FREE, THYROIDAB in the last 72 hours. Anemia Panel: No results for input(s): VITAMINB12, FOLATE, FERRITIN, TIBC, IRON, RETICCTPCT in the last 72 hours. Sepsis Labs: No results for input(s): PROCALCITON, LATICACIDVEN in the last 168 hours.  Recent Results (from the past 240 hour(s))  Acid Fast Smear (AFB)     Status: None   Collection Time: 07/12/19 12:43 PM   Specimen: Sputum  Result Value Ref Range Status   AFB Specimen Processing Concentration  Final   Acid Fast Smear Positive  Final    Comment: CRITICAL RESULT CALLED TO, READ BACK BY AND VERIFIED WITH: Sherrye Payor RN [id number] 07/13/19 A BROWNING (NOTE) 3+, 4-36 acid-fast bacilli per field at 400X magnification, fluorescent smear REPORTED RESULTS ON 07-13-19 AT 1526. FAX CONFORMATION TO [phone] Performed At: [facility] [address] [place], [place] [id number]  Jolene Schimke MD YY:[id number] Performed at [facility], [address], [place], [place] [address]    Source (AFB) SPUTUM  Final    Comment: Performed at [facility], [address], [place], [place] [address]  Acid Fast Smear (AFB)     Status: Abnormal   Collection Time: 07/18/19  7:04 PM   Specimen: Sputum  Result Value Ref Range Status   AFB Specimen Processing Concentration  Final   Acid Fast Smear Positive (A)  Final    Comment: (NOTE) 2+, 4-36 acid-fast bacilli per 10 fields at 400X magnification, fluorescent smear REPOTED RESULTS TO NATHAN T ON 07-19-19 AT 1633 FAX CONFORMATION TO [phone] Performed At: [facility] [address] [place], [place] [id number] Jolene Schimke MD TT:[id number]    Source (AFB) SPUTUM  Final    Comment: Performed at [facility], [address] [place], [place] [address]         Radiology Studies: No results found.      Scheduled Meds: . buPROPion  150 mg Oral Daily  . clotrimazole   Topical BID  . enoxaparin (LOVENOX) injection  40 mg Subcutaneous Q24H  . famotidine  20 mg Oral Daily  .  feeding supplement (ENSURE ENLIVE)  237 mL Oral Q24H  . ferrous sulfate  325 mg Oral BID WC  . isoniazid  300 mg Oral Daily   And  . vitamin B-6  50 mg Oral Daily  . loratadine  10 mg Oral Q breakfast  . mouth rinse  15 mL Mouth Rinse BID  . mometasone-formoterol  2 puff Inhalation BID  . multivitamin with minerals  1 tablet Oral Daily  . pravastatin  20 mg Oral q1800  . rifampin  600 mg Oral Daily  . thiamine  100 mg Oral Daily   Continuous Infusions: . sodium chloride Stopped (05/23/19 0250)     LOS: 67 days     Cordelia Poche, MD [facility] 07/21/2019, 2:09 PM  If 7PM-7AM, please contact night-coverage [email or web address]

## 2019-07-22 MED ORDER — SODIUM CHLORIDE 0.9 % IV SOLN
1.0000 g | INTRAVENOUS | Status: DC
  Administered 2019-07-22 – 2019-07-25 (×4): 1 g via INTRAVENOUS
  Filled 2019-07-22 (×4): qty 1

## 2019-07-22 NOTE — Progress Notes (Signed)
PROGRESS NOTE    Clarence Maldonado  DJS:[id number] DOB: 1946/05/22 DOA: 05/15/2019 PCP: [name], DO   Brief Narrative: Clarence Maldonado is a 73 y.o. male with history of stroke, COPD, CKD stage III, tobacco use, diabetes mellitus type 2, hypertension. Patient presented secondary to weakness and frequent falls, found to have pulmonary TB.   Assessment & Plan:   Principal Problem:   Pulmonary tuberculosis with cavitation Active Problems:   Essential hypertension   CKD (chronic kidney disease) stage 3, GFR 30-59 ml/min (HCC)   Hyperlipidemia   Tobacco use disorder   Controlled type 2 diabetes mellitus with stage 3 chronic kidney disease, without long-term current use of insulin (HCC)   Weight loss   Hyperlipidemia associated with type 2 diabetes mellitus (HCC)   Anemia   Moderate protein-calorie malnutrition (HCC)   Pulmonary TB Started on RIPE therapy per ID. Most recent AFB cuture (10/13) positive. Ethambutol and pyrazinamide discontinued -Continue Rifampin, isoniazid  Fever Tmax of 102.5 F overnight 10/15. Unknown source. Patient is asymptomatic. Urine culture significant for GNR. Blood culture no growth to date -Start Ceftriaxone  Dysphagia Evaluated by speech therapy. MBS significant for evidence of moderate oropharyngeal dysphagia with sensorimotor deficit and hypomotility of the pharynx. Patient deemed a high aspiration risk initially and now upgraded to a regular diet. Resolved.  Peripheral neuropathy Concern this could be related to RIPE therapy. Started on B6. Improved.  Heart mass Seen on left and right atria. Recommendation for outpatient cardiology follow-up. -May need to obtain Transesophageal Echocardiogram since patient has been in hospital for so long  Non-sustained V-tach Stable. Hypokalemia and magnesium likely contributed.  Hypokalemia/Hypomagnesemia Resolved.  Iron deficiency anemia -Continue iron supplementation  Diabetes mellitus, type  2 -Continue SSI  Weight loss Moderate malnutrition -Continue supplementation  CKD stage III Stable   DVT prophylaxis: Lovenox Code Status:   Code Status: Full Code Family Communication: None Disposition Plan: Discharge pending safe discharge plan, likely in 3 days if fever workup is unremarkable   Consultants:   ID  Procedures:   None  Antimicrobials:  Rifampin, isoniazid, pyrazinamide, ethambutol  Ceftriaxone   Subjective: No issues overnight.  Objective: Vitals:   07/21/19 2114 07/22/19 0501 07/22/19 0740 07/22/19 1347  BP: 135/70 129/82  126/90  Pulse: 98 (!) 106  100  Resp: 16 16  16   Temp: 97.9 F (36.6 C) 98.5 F (36.9 C)  98.7 F (37.1 C)  TempSrc:    Oral  SpO2: 97% 98% 96% 94%  Weight:      Height:        Intake/Output Summary (Last 24 hours) at 07/22/2019 1417 Last data filed at 07/22/2019 0830 Gross per 24 hour  Intake 840 ml  Output 200 ml  Net 640 ml   Filed Weights   07/16/19 0515 07/20/19 0600 07/21/19 0237  Weight: 11.4 kg 21.2 kg 18.6 kg    Examination:  General exam: Appears calm and comfortable Respiratory system: Scant wheeze, diminished breath sounds. Respiratory effort normal. Cardiovascular system: S1 & S2 heard, RRR. No murmurs, rubs, gallops or clicks. Gastrointestinal system: Abdomen is nondistended, soft and nontender. No organomegaly or masses felt. Normal bowel sounds heard. Central nervous system: Alert and oriented. No focal neurological deficits. Extremities: No edema. No calf tenderness Skin: No cyanosis. No rashes Psychiatry: Judgement and insight appear normal. Mood & affect appropriate.    Data Reviewed: I have personally reviewed following labs and imaging studies  CBC: Recent Labs  Lab 07/19/19 0511  WBC  7.0  NEUTROABS 4.3  HGB 12.9*  HCT 41.6  MCV 84.6  PLT 204   Basic Metabolic Panel: Recent Labs  Lab 07/19/19 0511  NA 134*  K 3.9  CL 101  CO2 23  GLUCOSE 99  BUN 35*  CREATININE  1.29*  CALCIUM 9.1  MG 2.1  PHOS 3.8   GFR: Estimated Creatinine Clearance: 13.6 mL/min (A) (by C-G formula based on SCr of 1.29 mg/dL (H)). Liver Function Tests: Recent Labs  Lab 07/19/19 0511  AST 17  ALT 12  ALKPHOS 68  BILITOT 0.7  PROT 6.5  ALBUMIN 2.9*   No results for input(s): LIPASE, AMYLASE in the last 168 hours. No results for input(s): AMMONIA in the last 168 hours. Coagulation Profile: No results for input(s): INR, PROTIME in the last 168 hours. Cardiac Enzymes: No results for input(s): CKTOTAL, CKMB, CKMBINDEX, TROPONINI in the last 168 hours. BNP (last 3 results) No results for input(s): PROBNP in the last 8760 hours. HbA1C: No results for input(s): HGBA1C in the last 72 hours. CBG: Recent Labs  Lab 07/16/19 0512 07/17/19 0812  GLUCAP 142* 104*   Lipid Profile: No results for input(s): CHOL, HDL, LDLCALC, TRIG, CHOLHDL, LDLDIRECT in the last 72 hours. Thyroid Function Tests: No results for input(s): TSH, T4TOTAL, FREET4, T3FREE, THYROIDAB in the last 72 hours. Anemia Panel: No results for input(s): VITAMINB12, FOLATE, FERRITIN, TIBC, IRON, RETICCTPCT in the last 72 hours. Sepsis Labs: No results for input(s): PROCALCITON, LATICACIDVEN in the last 168 hours.  Recent Results (from the past 240 hour(s))  Acid Fast Smear (AFB)     Status: Abnormal   Collection Time: 07/18/19  7:04 PM   Specimen: Sputum  Result Value Ref Range Status   AFB Specimen Processing Concentration  Final   Acid Fast Smear Positive (A)  Final    Comment: (NOTE) 2+, 4-36 acid-fast bacilli per 10 fields at 400X magnification, fluorescent smear REPOTED RESULTS TO NATHAN T ON 07-19-19 AT 1633 FAX CONFORMATION TO [phone] Performed At: [facility] [address] [place], [place] [id number] Jolene Schimke MD CV:[id number]    Source (AFB) SPUTUM  Final    Comment: Performed at [facility], [address] [place], [place] [address]  Culture,  blood (routine x 2)     Status: None (Preliminary result)   Collection Time: 07/21/19 10:28 AM   Specimen: BLOOD LEFT ARM  Result Value Ref Range Status   Specimen Description   Final    BLOOD LEFT ARM Performed at [facility], [address], [place], [place] [address]    Special Requests   Final    BOTTLES DRAWN AEROBIC ONLY Blood Culture adequate volume Performed at [facility], [address] [place], [place] [address]    Culture   Final    NO GROWTH < 24 HOURS Performed at [facility], [address], [place], [place] [address]    Report Status PENDING  Incomplete  Culture, blood (routine x 2)     Status: None (Preliminary result)   Collection Time: 07/21/19 10:28 AM   Specimen: BLOOD RIGHT ARM  Result Value Ref Range Status   Specimen Description   Final    BLOOD RIGHT ARM Performed at [facility], [address], [place], [place] [address]    Special Requests   Final    BOTTLES DRAWN AEROBIC ONLY Blood Culture adequate volume Performed at [facility], [address] [place],  [place] [address]    Culture   Final    NO GROWTH < 24 HOURS Performed at [facility], [place] [address], [place], [place] [address]    Report Status PENDING  Incomplete  Culture, Urine     Status: Abnormal (Preliminary result)   Collection Time: 07/21/19  2:34 PM   Specimen: Urine, Clean Catch  Result Value Ref Range Status   Specimen Description   Final    URINE, CLEAN CATCH Performed at [facility], [place] [address], [place], [place] [address]    Special Requests   Final    NONE Performed at [facility], [place] [address] [place], [place] [address]    Culture (A)  Final    >=100,000 COLONIES/mL GRAM NEGATIVE RODS CULTURE REINCUBATED FOR BETTER GROWTH Performed at [facility], [place] [address] [place],  [address]    Report Status PENDING   Incomplete         Radiology Studies: Dg Chest Port 1 View  Result Date: 07/21/2019 CLINICAL DATA:  Fever.  Positive TB test. EXAM: PORTABLE CHEST 1 VIEW COMPARISON:  Chest x-ray 07/15/2019, 07/01/2019, 05/19/2019. CT chest 05/15/2019 FINDINGS: Heart size stable. Severe bilateral pulmonary infiltrates particularly prominent on the left again noted. Slight improvement in aeration in the left upper lung. Worsening aeration in right mid lung. These have been demonstrated to be cavitary and are consistent patient's known diagnosis of TB. No pleural effusion or pneumothorax. IMPRESSION: Severe bilateral pulmonary infiltrates particularly prominent on the left again noted. Slight improvement in aeration in the left upper lung. Worsening aeration in the right mid lung. These infiltrates have been demonstrated to be cavitary inter consistent patient's known diagnosis of TB. Electronically Signed   By: Marcello Moores  Register   On: 07/21/2019 16:47        Scheduled Meds: . buPROPion  150 mg Oral Daily  . clotrimazole   Topical BID  . enoxaparin (LOVENOX) injection  40 mg Subcutaneous Q24H  . famotidine  20 mg Oral Daily  . feeding supplement (ENSURE ENLIVE)  237 mL Oral Q24H  . ferrous sulfate  325 mg Oral BID WC  . isoniazid  300 mg Oral Daily   And  . vitamin B-6  50 mg Oral Daily  . loratadine  10 mg Oral Q breakfast  . mouth rinse  15 mL Mouth Rinse BID  . mometasone-formoterol  2 puff Inhalation BID  . multivitamin with minerals  1 tablet Oral Daily  . pravastatin  20 mg Oral q1800  . rifampin  600 mg Oral Daily  . thiamine  100 mg Oral Daily   Continuous Infusions: . sodium chloride Stopped (05/23/19 0250)     LOS: 68 days     Cordelia Poche, MD [facility] 07/22/2019, 2:17 PM  If 7PM-7AM, please contact night-coverage [email or web address]

## 2019-07-23 DIAGNOSIS — N390 Urinary tract infection, site not specified: Secondary | ICD-10-CM

## 2019-07-23 LAB — URINE CULTURE: Culture: >=100000 — AB

## 2019-07-23 NOTE — Progress Notes (Signed)
PROGRESS NOTE    Clarence Maldonado  KYH:[id number] DOB: [birth year] DOA: 05/15/2019 PCP: [name], DO   Brief Narrative: Clarence Maldonado is a 73 y.o. male with history of stroke, COPD, CKD stage III, tobacco use, diabetes mellitus type 2, hypertension. Patient presented secondary to weakness and frequent falls, found to have pulmonary TB.   Assessment & Plan:   Principal Problem:   Pulmonary tuberculosis with cavitation Active Problems:   Essential hypertension   CKD (chronic kidney disease) stage 3, GFR 30-59 ml/min (HCC)   Hyperlipidemia   Tobacco use disorder   Controlled type 2 diabetes mellitus with stage 3 chronic kidney disease, without long-term current use of insulin (HCC)   Weight loss   Hyperlipidemia associated with type 2 diabetes mellitus (HCC)   Anemia   Moderate protein-calorie malnutrition (HCC)   Pulmonary TB Started on RIPE therapy per ID. Most recent AFB cuture (10/13) positive. Ethambutol and pyrazinamide discontinued -Continue Rifampin, isoniazid  UTI, complicated Afebrile now. Patient is asymptomatic, but treating secondary to complicated UTI and initial fever. Urine culture significant for Klebsiella oxytoca. -Continue Ceftriaxone  Dysphagia Evaluated by speech therapy. MBS significant for evidence of moderate oropharyngeal dysphagia with sensorimotor deficit and hypomotility of the pharynx. Patient deemed a high aspiration risk initially and now upgraded to a regular diet. Resolved.  Peripheral neuropathy Concern this could be related to RIPE therapy. Started on B6. Improved.  Heart mass Seen on left and right atria. Recommendation for outpatient cardiology follow-up. -Outpatient cardiology follow-up  Non-sustained V-tach Stable. Hypokalemia and magnesium likely contributed.  Hypokalemia/Hypomagnesemia Resolved.  Iron deficiency anemia -Continue iron supplementation  Diabetes mellitus, type 2 -Continue SSI  Weight loss Moderate  malnutrition -Continue supplementation  CKD stage III Stable   DVT prophylaxis: Lovenox Code Status:   Code Status: Full Code Family Communication: None Disposition Plan: Discharge home in 1-2 days pending health department   Consultants:   ID  Procedures:   None  Antimicrobials:  Rifampin, isoniazid, pyrazinamide, ethambutol  Ceftriaxone   Subjective: Afebrile. No issues. Productive cough is unchanged.  Objective: Vitals:   07/22/19 1347 07/22/19 1949 07/23/19 0522 07/23/19 0822  BP: 126/90 (!) 149/91 (!) 148/82   Pulse: 100 (!) 107 94   Resp: 16 18 20    Temp: 98.7 F (37.1 C) 98.7 F (37.1 C) 98.7 F (37.1 C)   TempSrc: Oral     SpO2: 94% 93% 99% 97%  Weight:      Height:        Intake/Output Summary (Last 24 hours) at 07/23/2019 1126 Last data filed at 07/23/2019 0600 Gross per 24 hour  Intake 580 ml  Output 900 ml  Net -320 ml   Filed Weights   07/16/19 0515 07/20/19 0600 07/21/19 0237  Weight: 11.4 kg 21.2 kg 18.6 kg    Examination:  General exam: Appears calm and comfortable  Respiratory system: Diminished mildly. Respiratory effort normal. Cardiovascular system: S1 & S2 heard, RRR. No murmurs, rubs, gallops or clicks. Gastrointestinal system: Abdomen is nondistended, soft and nontender. No organomegaly or masses felt. Normal bowel sounds heard. Central nervous system: Alert and oriented. No focal neurological deficits. Extremities: No edema. No calf tenderness Skin: No cyanosis. No rashes Psychiatry: Judgement and insight appear normal. Mood & affect appropriate.     Data Reviewed: I have personally reviewed following labs and imaging studies  CBC: Recent Labs  Lab 07/19/19 0511  WBC 7.0  NEUTROABS 4.3  HGB 12.9*  HCT 41.6  MCV 84.6  PLT 093   Basic Metabolic Panel: Recent Labs  Lab 07/19/19 0511  NA 134*  K 3.9  CL 101  CO2 23  GLUCOSE 99  BUN 35*  CREATININE 1.29*  CALCIUM 9.1  MG 2.1  PHOS 3.8   GFR:  Estimated Creatinine Clearance: 13.6 mL/min (A) (by C-G formula based on SCr of 1.29 mg/dL (H)). Liver Function Tests: Recent Labs  Lab 07/19/19 0511  AST 17  ALT 12  ALKPHOS 68  BILITOT 0.7  PROT 6.5  ALBUMIN 2.9*   No results for input(s): LIPASE, AMYLASE in the last 168 hours. No results for input(s): AMMONIA in the last 168 hours. Coagulation Profile: No results for input(s): INR, PROTIME in the last 168 hours. Cardiac Enzymes: No results for input(s): CKTOTAL, CKMB, CKMBINDEX, TROPONINI in the last 168 hours. BNP (last 3 results) No results for input(s): PROBNP in the last 8760 hours. HbA1C: No results for input(s): HGBA1C in the last 72 hours. CBG: Recent Labs  Lab 07/17/19 0812  GLUCAP 104*   Lipid Profile: No results for input(s): CHOL, HDL, LDLCALC, TRIG, CHOLHDL, LDLDIRECT in the last 72 hours. Thyroid Function Tests: No results for input(s): TSH, T4TOTAL, FREET4, T3FREE, THYROIDAB in the last 72 hours. Anemia Panel: No results for input(s): VITAMINB12, FOLATE, FERRITIN, TIBC, IRON, RETICCTPCT in the last 72 hours. Sepsis Labs: No results for input(s): PROCALCITON, LATICACIDVEN in the last 168 hours.  Recent Results (from the past 240 hour(s))  Acid Fast Smear (AFB)     Status: Abnormal   Collection Time: 07/18/19  7:04 PM   Specimen: Sputum  Result Value Ref Range Status   AFB Specimen Processing Concentration  Final   Acid Fast Smear Positive (A)  Final    Comment: (NOTE) 2+, 4-36 acid-fast bacilli per 10 fields at 400X magnification, fluorescent smear REPOTED RESULTS TO NATHAN T ON 07-19-19 AT 1633 FAX CONFORMATION TO [phone] Performed At: [facility] [place], [place] [id number] Rush Farmer MD FB:[id number]    Source (AFB) SPUTUM  Final    Comment: Performed at [facility], [place] [address], [place], [place] [address]  Culture, blood (routine x 2)     Status: None (Preliminary result)   Collection  Time: 07/21/19 10:28 AM   Specimen: BLOOD LEFT ARM  Result Value Ref Range Status   Specimen Description   Final    BLOOD LEFT ARM Performed at [place] [address], [place], [place] [address]    Special Requests   Final    BOTTLES DRAWN AEROBIC ONLY Blood Culture adequate volume Performed at [place] [address], [place], [place] [address]    Culture   Final    NO GROWTH 2 DAYS Performed at [place] [address], [place], [place] [address]    Report Status PENDING  Incomplete  Culture, blood (routine x 2)     Status: None (Preliminary result)   Collection Time: 07/21/19 10:28 AM   Specimen: BLOOD RIGHT ARM  Result Value Ref Range Status   Specimen Description   Final    BLOOD RIGHT ARM Performed at [place] [address], [place], [place] [address]    Special Requests   Final    BOTTLES DRAWN AEROBIC ONLY Blood Culture adequate volume Performed at [place] [address], [place], [place] [address]    Culture   Final    NO GROWTH 2 DAYS Performed at  [facility], [address] [place], [place] [address]    Report Status PENDING  Incomplete  Culture, Urine     Status: Abnormal   Collection Time: 07/21/19  2:34 PM   Specimen: Urine, Clean Catch  Result Value Ref Range Status   Specimen Description   Final    URINE, CLEAN CATCH Performed at [facility], [address], [place], [place] [address]    Special Requests   Final    NONE Performed at [facility], [address] [place], [place] [address]    Culture (A)  Final    >=100,000 COLONIES/mL KLEBSIELLA OXYTOCA >=100,000 COLONIES/mL LACTOBACILLUS SPECIES Standardized susceptibility testing for this organism is not available. Performed at [facility], [address], [place], [place] [address]    Report Status 07/23/2019 FINAL  Final   Organism ID,  Bacteria KLEBSIELLA OXYTOCA (A)  Final      Susceptibility   Klebsiella oxytoca - MIC*    AMPICILLIN >=32 RESISTANT Resistant     CEFAZOLIN >=64 RESISTANT Resistant     CEFTRIAXONE <=1 SENSITIVE Sensitive     CIPROFLOXACIN <=0.25 SENSITIVE Sensitive     GENTAMICIN <=1 SENSITIVE Sensitive     IMIPENEM <=0.25 SENSITIVE Sensitive     NITROFURANTOIN 64 INTERMEDIATE Intermediate     TRIMETH/SULFA <=20 SENSITIVE Sensitive     AMPICILLIN/SULBACTAM >=32 RESISTANT Resistant     PIP/TAZO >=128 RESISTANT Resistant     Extended ESBL NEGATIVE Sensitive     * >=100,000 COLONIES/mL KLEBSIELLA OXYTOCA         Radiology Studies: Dg Chest Port 1 View  Result Date: 07/21/2019 CLINICAL DATA:  Fever.  Positive TB test. EXAM: PORTABLE CHEST 1 VIEW COMPARISON:  Chest x-ray 07/15/2019, 07/01/2019, 05/19/2019. CT chest 05/15/2019 FINDINGS: Heart size stable. Severe bilateral pulmonary infiltrates particularly prominent on the left again noted. Slight improvement in aeration in the left upper lung. Worsening aeration in right mid lung. These have been demonstrated to be cavitary and are consistent patient's known diagnosis of TB. No pleural effusion or pneumothorax. IMPRESSION: Severe bilateral pulmonary infiltrates particularly prominent on the left again noted. Slight improvement in aeration in the left upper lung. Worsening aeration in the right mid lung. These infiltrates have been demonstrated to be cavitary inter consistent patient's known diagnosis of TB. Electronically Signed   By: Maisie Fus  Register   On: 07/21/2019 16:47        Scheduled Meds: . buPROPion  150 mg Oral Daily  . clotrimazole   Topical BID  . enoxaparin (LOVENOX) injection  40 mg Subcutaneous Q24H  . famotidine  20 mg Oral Daily  . feeding supplement (ENSURE ENLIVE)  237 mL Oral Q24H  . ferrous sulfate  325 mg Oral BID WC  . isoniazid  300 mg Oral Daily   And  . vitamin B-6  50 mg Oral Daily  . loratadine  10 mg Oral Q breakfast   . mouth rinse  15 mL Mouth Rinse BID  . mometasone-formoterol  2 puff Inhalation BID  . multivitamin with minerals  1 tablet Oral Daily  . pravastatin  20 mg Oral q1800  . rifampin  600 mg Oral Daily  . thiamine  100 mg Oral Daily   Continuous Infusions: . sodium chloride 250 mL (07/22/19 1709)  . cefTRIAXone (ROCEPHIN)  IV 1 g (07/22/19 1712)     LOS: 69 days     Jacquelin Hawking, MD [facility] 07/23/2019, 11:26 AM  If 7PM-7AM, please contact night-coverage [email or web address]

## 2019-07-24 MED ORDER — HYDROCERIN EX CREA
TOPICAL_CREAM | Freq: Two times a day (BID) | CUTANEOUS | Status: DC
  Administered 2019-07-24 – 2019-07-25 (×2): via TOPICAL
  Filled 2019-07-24: qty 113

## 2019-07-24 NOTE — Progress Notes (Signed)
Physical Therapy Treatment Patient Details Name: Clarence Maldonado MRN: [id number] DOB: Nov 30, 1945 Today's Date: 07/24/2019    History of Present Illness 73 y.o. male with medical history significant of cardiomegaly, stroke, COPD,   CKD stage III, tobacco abuse, DM 2, HTN who presented to the emergency room on 8/10 with progressive weakness over the last 3 months to the point where he was falling over. He has lost approximately 43 pounds in the past 2 months. Pt found to have Pulmonary tuberculosis with cavitation    PT Comments    Pt continues to participate well with therapy. Continue to recommend RW for ambulation safety. SpO2: 97% on RA at rest, 88% on RA after ambulation. Per chart, plan is for d/c home now.    Follow Up Recommendations  Home health PT;Supervision - Intermittent     Equipment Recommendations  Rolling walker with 5" wheels    Recommendations for Other Services       Precautions / Restrictions Precautions Precautions: Fall Precaution Comments: monitor O2 Restrictions Weight Bearing Restrictions: No    Mobility  Bed Mobility               General bed mobility comments: sitting EOB upon arrival  Transfers Overall transfer level: Modified independent Equipment used: Rolling walker (2 wheeled) Transfers: Sit to/from Stand              Ambulation/Gait Ambulation/Gait assistance: Supervision Gait Distance (Feet): 75 Feet(85'x1; 60'x1) Assistive device: Rolling walker (2 wheeled) Gait Pattern/deviations: Step-through pattern;Decreased stride length     General Gait Details: SpO2 97% on RA at rest; 88% on RA during ambulation. Dyspnea 2/4. Cues for pursed lip breathing when seated, resting on bed.   Stairs             Wheelchair Mobility    Modified Rankin (Stroke Patients Only)       Balance Overall balance assessment: Needs assistance         Standing balance support: Bilateral upper extremity supported Standing  balance-Leahy Scale: Fair                              Cognition Arousal/Alertness: Awake/alert Behavior During Therapy: WFL for tasks assessed/performed Overall Cognitive Status: Within Functional Limits for tasks assessed                                        Exercises      General Comments        Pertinent Vitals/Pain Pain Assessment: No/denies pain    Home Living                      Prior Function            PT Goals (current goals can now be found in the care plan section) Progress towards PT goals: Progressing toward goals    Frequency    Min 3X/week      PT Plan Current plan remains appropriate    Co-evaluation              AM-PAC PT "6 Clicks" Mobility   Outcome Measure  Help needed turning from your back to your side while in a flat bed without using bedrails?: None Help needed moving from lying on your back to sitting on the side of a flat bed without using bedrails?:  None Help needed moving to and from a bed to a chair (including a wheelchair)?: A Little Help needed standing up from a chair using your arms (e.g., wheelchair or bedside chair)?: A Little Help needed to walk in hospital room?: A Little Help needed climbing 3-5 steps with a railing? : A Little 6 Click Score: 20    End of Session   Activity Tolerance: Patient tolerated treatment well Patient left: in bed;with call bell/phone within reach   PT Visit Diagnosis: Muscle weakness (generalized) (M62.81);Difficulty in walking, not elsewhere classified (R26.2)     Time: 5621-3086 PT Time Calculation (min) (ACUTE ONLY): 51 min  Charges:  $Gait Training: 23-37 mins                       Rebeca Alert, PT Acute Rehabilitation Services Pager: [phone] Office: [phone]

## 2019-07-24 NOTE — Progress Notes (Signed)
PROGRESS NOTE    Clarence Maldonado  NID:[id number] DOB: 1946-04-17 DOA: 05/15/2019 PCP: Gayland Curry, DO   Brief Narrative: Clarence Maldonado is a 73 y.o. male with history of stroke, COPD, CKD stage III, tobacco use, diabetes mellitus type 2, hypertension. Patient presented secondary to weakness and frequent falls, found to have pulmonary TB.   Assessment & Plan:   Principal Problem:   Pulmonary tuberculosis with cavitation Active Problems:   Essential hypertension   CKD (chronic kidney disease) stage 3, GFR 30-59 ml/min (HCC)   Hyperlipidemia   Tobacco use disorder   Controlled type 2 diabetes mellitus with stage 3 chronic kidney disease, without long-term current use of insulin (HCC)   Weight loss   Hyperlipidemia associated with type 2 diabetes mellitus ([place])   Anemia   Moderate protein-calorie malnutrition ([place])   Acute lower UTI   Pulmonary TB Started on RIPE therapy per ID. Most recent AFB cuture (10/13) positive. Ethambutol and pyrazinamide discontinued -Continue Rifampin, isoniazid  UTI, complicated Afebrile now. Patient is asymptomatic, but treating secondary to complicated UTI and initial fever. Urine culture significant for Klebsiella oxytoca. -Continue Ceftriaxone  Dysphagia Evaluated by speech therapy. MBS significant for evidence of moderate oropharyngeal dysphagia with sensorimotor deficit and hypomotility of the pharynx. Patient deemed a high aspiration risk initially and now upgraded to a regular diet. Resolved.  Peripheral neuropathy Concern this could be related to RIPE therapy. Started on B6. Improved.  Heart mass Seen on left and right atria. Recommendation for outpatient cardiology follow-up. -Outpatient cardiology follow-up  Non-sustained V-tach Stable. Hypokalemia and magnesium likely contributed.  Hypokalemia/Hypomagnesemia Resolved.  Iron deficiency anemia -Continue iron supplementation  Diabetes mellitus, type 2 -Continue SSI   Weight loss Moderate malnutrition -Continue supplementation  CKD stage III Stable   DVT prophylaxis: Lovenox Code Status:   Code Status: Full Code Family Communication: None Disposition Plan: Discharge home in 1-2 days pending health department   Consultants:   ID  Procedures:   None  Antimicrobials:  Rifampin, isoniazid, pyrazinamide, ethambutol  Ceftriaxone   Subjective: No concerns today.  Objective: Vitals:   07/23/19 0522 07/23/19 0822 07/23/19 2122 07/24/19 0649  BP: (!) 148/82  135/69 137/84  Pulse: 94  (!) 101 99  Resp: 20  19 18   Temp: 98.7 F (37.1 C)  98.4 F (36.9 C) 98.2 F (36.8 C)  TempSrc:    Oral  SpO2: 99% 97% 92% 93%  Weight:    26.9 kg  Height:        Intake/Output Summary (Last 24 hours) at 07/24/2019 1219 Last data filed at 07/24/2019 0900 Gross per 24 hour  Intake 960 ml  Output 2500 ml  Net -1540 ml   Filed Weights   07/20/19 0600 07/21/19 0237 07/24/19 0649  Weight: 21.2 kg 18.6 kg 26.9 kg    Examination:  General exam: Appears calm and comfortable Respiratory system: Diminished. Mild inspiratory wheeze. Respiratory effort normal. Cardiovascular system: S1 & S2 heard, RRR. No murmurs, rubs, gallops or clicks. Gastrointestinal system: Abdomen is nondistended, soft and nontender. No organomegaly or masses felt. Normal bowel sounds heard. Central nervous system: Alert and oriented. No focal neurological deficits. Extremities: No edema. No calf tenderness Skin: No cyanosis. No rashes Psychiatry: Judgement and insight appear normal. Mood & affect appropriate.      Data Reviewed: I have personally reviewed following labs and imaging studies  CBC: Recent Labs  Lab 07/19/19 0511  WBC 7.0  NEUTROABS 4.3  HGB 12.9*  HCT 41.6  MCV 84.6  PLT 204   Basic Metabolic Panel: Recent Labs  Lab 07/19/19 0511  NA 134*  K 3.9  CL 101  CO2 23  GLUCOSE 99  BUN 35*  CREATININE 1.29*  CALCIUM 9.1  MG 2.1  PHOS 3.8    GFR: Estimated Creatinine Clearance: 19.7 mL/min (A) (by C-G formula based on SCr of 1.29 mg/dL (H)). Liver Function Tests: Recent Labs  Lab 07/19/19 0511  AST 17  ALT 12  ALKPHOS 68  BILITOT 0.7  PROT 6.5  ALBUMIN 2.9*   No results for input(s): LIPASE, AMYLASE in the last 168 hours. No results for input(s): AMMONIA in the last 168 hours. Coagulation Profile: No results for input(s): INR, PROTIME in the last 168 hours. Cardiac Enzymes: No results for input(s): CKTOTAL, CKMB, CKMBINDEX, TROPONINI in the last 168 hours. BNP (last 3 results) No results for input(s): PROBNP in the last 8760 hours. HbA1C: No results for input(s): HGBA1C in the last 72 hours. CBG: No results for input(s): GLUCAP in the last 168 hours. Lipid Profile: No results for input(s): CHOL, HDL, LDLCALC, TRIG, CHOLHDL, LDLDIRECT in the last 72 hours. Thyroid Function Tests: No results for input(s): TSH, T4TOTAL, FREET4, T3FREE, THYROIDAB in the last 72 hours. Anemia Panel: No results for input(s): VITAMINB12, FOLATE, FERRITIN, TIBC, IRON, RETICCTPCT in the last 72 hours. Sepsis Labs: No results for input(s): PROCALCITON, LATICACIDVEN in the last 168 hours.  Recent Results (from the past 240 hour(s))  Acid Fast Smear (AFB)     Status: Abnormal   Collection Time: 07/18/19  7:04 PM   Specimen: Sputum  Result Value Ref Range Status   AFB Specimen Processing Concentration  Final   Acid Fast Smear Positive (A)  Final    Comment: (NOTE) 2+, 4-36 acid-fast bacilli per 10 fields at 400X magnification, fluorescent smear REPOTED RESULTS TO NATHAN T ON 07-19-19 AT 1633 FAX CONFORMATION TO [phone] Performed At: [facility] [address] [place], [place] [id number] Jolene Schimke MD NI:[id number]    Source (AFB) SPUTUM  Final    Comment: Performed at [facility], [address] [place], [place] [address]  Culture, blood (routine x 2)     Status: None (Preliminary result)    Collection Time: 07/21/19 10:28 AM   Specimen: BLOOD LEFT ARM  Result Value Ref Range Status   Specimen Description   Final    BLOOD LEFT ARM Performed at [facility], [address] [place], [place] [address]    Special Requests   Final    BOTTLES DRAWN AEROBIC ONLY Blood Culture adequate volume Performed at [facility], [address] [place], [place] [address]    Culture   Final    NO GROWTH 3 DAYS Performed at [facility], [address] [place], [place] [address]    Report Status PENDING  Incomplete  Culture, blood (routine x 2)     Status: None (Preliminary result)   Collection Time: 07/21/19 10:28 AM   Specimen: BLOOD RIGHT ARM  Result Value Ref Range Status   Specimen Description   Final    BLOOD RIGHT ARM Performed at [facility], [address] [place], [place] [address]    Special Requests   Final    BOTTLES DRAWN AEROBIC ONLY Blood Culture adequate volume Performed at [facility], [address] [place], [place] [address]    Culture   Final    NO GROWTH 3 DAYS  Performed at [facility], [address] [place] [address], [place], [place] [address]    Report Status PENDING  Incomplete  Culture, Urine     Status: Abnormal   Collection Time: 07/21/19  2:34 PM   Specimen: Urine, Clean Catch  Result Value Ref Range Status   Specimen Description   Final    URINE, CLEAN CATCH Performed at [facility], [address], [place], [place] [address]    Special Requests   Final    NONE Performed at [facility], [name] [place], [place] [address]    Culture (A)  Final    >=100,000 COLONIES/mL KLEBSIELLA OXYTOCA >=100,000 COLONIES/mL LACTOBACILLUS SPECIES Standardized susceptibility testing for this organism is not available. Performed at [facility], [address] [place], [place] [address]    Report Status 07/23/2019 FINAL  Final    Organism ID, Bacteria KLEBSIELLA OXYTOCA (A)  Final      Susceptibility   Klebsiella oxytoca - MIC*    AMPICILLIN >=32 RESISTANT Resistant     CEFAZOLIN >=64 RESISTANT Resistant     CEFTRIAXONE <=1 SENSITIVE Sensitive     CIPROFLOXACIN <=0.25 SENSITIVE Sensitive     GENTAMICIN <=1 SENSITIVE Sensitive     IMIPENEM <=0.25 SENSITIVE Sensitive     NITROFURANTOIN 64 INTERMEDIATE Intermediate     TRIMETH/SULFA <=20 SENSITIVE Sensitive     AMPICILLIN/SULBACTAM >=32 RESISTANT Resistant     PIP/TAZO >=128 RESISTANT Resistant     Extended ESBL NEGATIVE Sensitive     * >=100,000 COLONIES/mL KLEBSIELLA OXYTOCA         Radiology Studies: No results found.      Scheduled Meds: . buPROPion  150 mg Oral Daily  . clotrimazole   Topical BID  . enoxaparin (LOVENOX) injection  40 mg Subcutaneous Q24H  . famotidine  20 mg Oral Daily  . feeding supplement (ENSURE ENLIVE)  237 mL Oral Q24H  . ferrous sulfate  325 mg Oral BID WC  . isoniazid  300 mg Oral Daily   And  . vitamin B-6  50 mg Oral Daily  . loratadine  10 mg Oral Q breakfast  . mouth rinse  15 mL Mouth Rinse BID  . mometasone-formoterol  2 puff Inhalation BID  . multivitamin with minerals  1 tablet Oral Daily  . pravastatin  20 mg Oral q1800  . rifampin  600 mg Oral Daily  . thiamine  100 mg Oral Daily   Continuous Infusions: . sodium chloride 10 mL/hr at 07/23/19 1533  . cefTRIAXone (ROCEPHIN)  IV 1 g (07/23/19 1534)     LOS: 70 days     Jacquelin Hawking, MD [facility] 07/24/2019, 12:19 PM  If 7PM-7AM, please contact night-coverage [email or web address]

## 2019-07-24 NOTE — TOC Initial Note (Signed)
Transition of Care [facility]) - Initial/Assessment Note    Patient Details  Name: Clarence Maldonado MRN: [id number] Date of Birth: 09-Jan-1946  Transition of Care [facility]) CM/SW Contact:    Geni Bers, RN Phone Number: 07/24/2019, 3:12 PM  Clinical Narrative:                 Spoke with Bradly Bienenstock, RN with HD [phone] concerning pt's discharge in the AM. Please call Kisha to inform her of pt's discharge and time pt was administered his medications. Plan to discharge pt home to his [place], Health Department and [place] following pt at home. Pt will need to transport home with [facility].   Expected Discharge Plan: Home w Home Health Services Barriers to Discharge: Continued Medical Work up   Patient Goals and CMS Choice Patient states their goals for this hospitalization and ongoing recovery are:: To get moving and feeling better      Expected Discharge Plan and Services Expected Discharge Plan: Home w Home Health Services   Discharge Planning Services: CM Consult   Living arrangements for the past 2 months: Mobile Home                                      Prior Living Arrangements/Services Living arrangements for the past 2 months: Mobile Home Lives with:: Self Patient language and need for interpreter reviewed:: Yes        Need for Family Participation in Patient Care: Yes (Comment) Care giver support system in place?: No (comment)      Activities of Daily Living Home Assistive Devices/Equipment: None ADL Screening (condition at time of admission) Patient's cognitive ability adequate to safely complete daily activities?: Yes Is the patient deaf or have difficulty hearing?: No Does the patient have difficulty seeing, even when wearing glasses/contacts?: No Does the patient have difficulty concentrating, remembering, or making decisions?: No Patient able to express need for assistance with ADLs?: Yes Does the patient have difficulty dressing or bathing?:  No Independently performs ADLs?: No Communication: Needs assistance Is this a change from baseline?: Change from baseline, expected to last >3 days Dressing (OT): Needs assistance Is this a change from baseline?: Change from baseline, expected to last >3 days Grooming: Needs assistance Is this a change from baseline?: Change from baseline, expected to last >3 days Feeding: Needs assistance Is this a change from baseline?: Change from baseline, expected to last >3 days Bathing: Needs assistance Is this a change from baseline?: Change from baseline, expected to last >3 days Toileting: Needs assistance Is this a change from baseline?: Change from baseline, expected to last >3days In/Out Bed: Needs assistance Is this a change from baseline?: Change from baseline, expected to last >3 days Walks in Home: Needs assistance Is this a change from baseline?: Change from baseline, expected to last >3 days Does the patient have difficulty walking or climbing stairs?: Yes Weakness of Legs: Both Weakness of Arms/Hands: Both  Permission Sought/Granted                  Emotional Assessment Appearance:: Appears stated age     Orientation: : Oriented to Self, Oriented to Place, Oriented to  Time, Oriented to Situation      Admission diagnosis:  Hypokalemia [E87.6] Lung infiltrate [R91.8] Fatigue, unspecified type [R53.83] Anemia, unspecified type [D64.9] Patient Active Problem List   Diagnosis Date Noted  . Acute lower UTI 07/23/2019  . Fatigue   .  Lung infiltrate   . Pulmonary tuberculosis with cavitation 06/06/2019  . Moderate protein-calorie malnutrition ([place]) 06/06/2019  . Hypokalemia 05/15/2019  . Anemia 05/15/2019  . Hypermetropia of both eyes 03/21/2018  . Weight loss 03/21/2018  . Hyperlipidemia associated with type 2 diabetes mellitus ([place]) 03/21/2018  . Insomnia 02/03/2017  . Cognitive change 01/29/2016  . Illiterate 01/03/2014  . Controlled type 2 diabetes mellitus  with stage 3 chronic kidney disease, without long-term current use of insulin ([place]) 01/05/2013  . Stranguria 01/05/2013  . Essential hypertension 01/04/2013  . CKD (chronic kidney disease) stage 3, GFR 30-59 ml/min (HCC) 01/04/2013  . Hyperlipidemia 01/04/2013  . Tobacco use disorder 01/04/2013  . BPH without obstruction/lower urinary tract symptoms 01/04/2013   PCP:  Gayland Curry, DO Pharmacy:   [facility] [address] [place], [place]. [place] [address] Phone: [phone] Fax: [phone]  [id number] [address], [place]. [place] [address] Phone: [phone] Fax: [phone]  [id number] - [place], [place] - [address] [place] [address] Phone: [phone] Fax: [phone]     Social Determinants of Health ([place]) Interventions    Readmission Risk Interventions Readmission Risk Prevention Plan 05/23/2019  Transportation Screening Complete  PCP or Specialist Appt within 3-5 Days Not Complete  Not Complete comments Not ready for dc  [place] or [place] Complete  Social Work Consult for [place] Planning/Counseling Not Complete  SW consult not completed comments nA  Palliative Care Screening Not Applicable  Medication Review Press photographer) Complete  Some recent data might be hidden

## 2019-07-25 MED ORDER — RIFAMPIN 300 MG PO CAPS: 600.0000 mg | ORAL_CAPSULE | Freq: Every day | ORAL | Status: DC

## 2019-07-25 MED ORDER — PYRIDOXINE HCL 50 MG PO TABS: 50.0000 mg | ORAL_TABLET | Freq: Every day | ORAL | 0 refills | Status: DC

## 2019-07-25 MED ORDER — ENSURE ENLIVE PO LIQD: 237.0000 mL | ORAL | 0 refills | Status: AC

## 2019-07-25 MED ORDER — CEFDINIR 300 MG PO CAPS: 300.0000 mg | ORAL_CAPSULE | Freq: Two times a day (BID) | ORAL | 0 refills | Status: AC

## 2019-07-25 MED ORDER — FERROUS SULFATE 325 (65 FE) MG PO TABS: 325.0000 mg | ORAL_TABLET | Freq: Two times a day (BID) | ORAL | 0 refills | Status: DC

## 2019-07-25 MED ORDER — HYDROCERIN EX CREA: 1.0000 "application " | TOPICAL_CREAM | Freq: Two times a day (BID) | CUTANEOUS | 0 refills | Status: DC

## 2019-07-25 MED ORDER — ISONIAZID 300 MG PO TABS: 300.0000 mg | ORAL_TABLET | Freq: Every day | ORAL | Status: DC

## 2019-07-25 NOTE — TOC Transition Note (Addendum)
Transition of Care [facility]) - CM/SW Discharge Note   Patient Details  Name: Clarence Maldonado MRN: [id number] Date of Birth: 1946/01/26  Transition of [facility]) CM/SW Contact:  Lia Hopping, [place] Phone Number: 07/25/2019, 1:50 PM   Clinical Narrative:    CSW reached out to Health Department RN- Alda Berthold. She is aware the patient will discharge back to his camper today. CSW confirm with nurse the patient dose of medication has been given to the patient today. Kisha plans to follow up with the patient tomorrow and will follow up with ID-Aul Mangieri for discharge summary and additional documents. Patient is discharging on Home 02. Documentation from 9/5 Case Manager indicates O2 was delivered by [employer] to the camper. CSW confirm with patient friend Mr. [name], the O2 concentrator is in the camper.  DME-RW and 3 IN 1 ordered through [employer]. DME will be delivered to the hospital and Mr. [name] will transport patient equipment to his camper.  CSW confirmed with dispatcher Thayer Headings at [facility], they will transport the patient to his camper/ they will have required precautions.   [facility] will be called once, DME has been delivered.   Update: Per nurse, the patient does not need O2, he is saturating on room air.      Final next level of care: [place] Barriers to Discharge: Barriers Resolved   Patient Goals and CMS Choice Patient states their goals for this hospitalization and ongoing recovery are:: To get moving and feeling better CMS Medicare.gov Compare Post Acute Care list provided to:: Patient Choice offered to / list presented to : Patient  Discharge Placement  Home                 Name of family member notified: Mr. [name] Patient and family notified of of transfer: 07/25/19  Discharge Plan and Services   Discharge Planning Services: CM Consult            DME Arranged: 3-N-1, Gilford Rile rolling DME Agency: [employer] Date DME Agency Contacted: 07/25/19 Time DME  Agency Contacted: 66 Representative spoke with at DME Agency: Andree Coss HH Arranged: PT, OT, RN [facility] Agency: [place] Date [place]: 07/25/19 Time [place]: [place]) Interventions     Readmission Risk Interventions Readmission Risk Prevention Plan 07/25/2019 05/23/2019  Transportation Screening Complete Complete  PCP or Specialist Appt within 3-5 Days Complete Not Complete  Not Complete comments - Not ready for dc  [place] or [place] Complete Complete  Social Work Consult for [place] Planning/Counseling Complete Not Complete  SW consult not completed comments - nA  Palliative Care Screening Not Applicable Not Applicable  Medication Review Press photographer) Complete Complete  Some recent data might be hidden

## 2019-07-25 NOTE — Progress Notes (Signed)
Transported to home via PT[facility] at this time, received all instructions and personal belongings.

## 2019-07-25 NOTE — Discharge Summary (Signed)
Physician Discharge Summary  Clarence LaosRobert M Nolley ZOX:[id number] DOB: 12-15-1945 DOA: 05/15/2019  PCP: Kermit Baloeed, Tiffany L, DO  Admit date: 05/15/2019 Discharge date: 07/25/2019  Admitted From: Home Disposition: Home  Recommendations for Outpatient Follow-up:  1. Follow up with PCP in 1 week 2. [facility] 3. Please follow up on the following pending results: None  Home Health: PT, OT, RN Equipment/Devices: Rolling walker, 3 in 1 commode  Discharge Condition: Stable CODE STATUS: Full code Diet recommendation: Heart healthy   Brief/Interim Summary:  Admission HPI written by Therisa DoyneAnastassia Doutova, MD   HPI: Clarence LaosRobert M Freitas is a 73 y.o. male with medical history significant of cardiomegaly, stroke, COPD,   CKD stage III, tobacco abuse, DM 2, HTN,    Presented with aggressive weakness to the point where patient started to fall over.  He lives at home by himself and has been progressively getting worse over the past 3 months Finally today his friend called EMS She does not endorse any fevers headache has had decreased appetite he just feels weak and often falls denies any dysuria he coughs sometimes with productive of sputum Smokes only occasionally. does not drink, Denies any hx of Tb, no exposure to Tb no military service not homeless, no hx of incarceration    Hospital course:  TB Started on RIPE therapy per ID. Most recent AFB cuture (10/13) positive. Ethambutol and pyrazinamide discontinued. AFB smear and culture remain positive. Discharge complicated by poor functional status and inability to place patient at another facility for rehabilitation. On discharge, continue Rifampin, isoniazid [facility]  UTI, complicated Patient initially febrile and workup revealed evidence of bacteriuria. Patient is asymptomatic, but treated secondary to complicated UTI and initial fever. Urine culture significant for  Klebsiella oxytoca. Started on ceftriaxone for treatment and transitioned to Cefdinir on discharge.  Dysphagia Evaluated by speech therapy. MBS significant for evidence of moderate oropharyngeal dysphagia with sensorimotor deficit and hypomotility of the pharynx. Patient deemed a high aspiration risk initially and now upgraded to a regular diet. Resolved.  Peripheral neuropathy Concern this could be related to RIPE therapy. Started on B6. Improved.  Heart mass Seen on left and right atria. Recommendation for outpatient cardiology follow-up. Outpatient cardiology follow-up  Non-sustained V-tach Stable. Hypokalemia and magnesium likely contributed.  Hypokalemia/Hypomagnesemia Resolved.  Iron deficiency anemia Continue iron supplementation  Diabetes mellitus, type 2 Sliding scale insulin while inpatient. Resume home glipizide on discharge.  Weight loss Moderate malnutrition Continue supplementation  CKD stage III Stable  Discharge Diagnoses:  Principal Problem:   Pulmonary tuberculosis with cavitation Active Problems:   Essential hypertension   CKD (chronic kidney disease) stage 3, GFR 30-59 ml/min (HCC)   Hyperlipidemia   Tobacco use disorder   Controlled type 2 diabetes mellitus with stage 3 chronic kidney disease, without long-term current use of insulin (HCC)   Weight loss   Hyperlipidemia associated with type 2 diabetes mellitus (HCC)   Anemia   Moderate protein-calorie malnutrition (HCC)   Acute lower UTI    Discharge Instructions  Discharge Instructions    Increase activity slowly   Complete by: As directed      Allergies as of 07/25/2019   No Known Allergies     Medication List    STOP taking these medications   lisinopril-hydrochlorothiazide 20-25 MG tablet Commonly known as: ZESTORETIC     TAKE these medications   albuterol 108 (90 Base) MCG/ACT inhaler Commonly known as: VENTOLIN  HFA INHALE 1 TO 2 PUFFS BY MOUTH EVERY 4 TO 6 HOURS  AS NEEDED FOR BREATHING What changed: See the new instructions.   aspirin EC 81 MG tablet Take 81 mg by mouth 2 (two) times daily.   buPROPion 150 MG 24 hr tablet Commonly known as: WELLBUTRIN XL TAKE 1 TABLET(150 MG) BY MOUTH DAILY What changed: See the new instructions.   feeding supplement (ENSURE ENLIVE) Liqd Take 237 mLs by mouth daily. Start taking on: July 26, 2019   ferrous sulfate 325 (65 FE) MG tablet Take 1 tablet (325 mg total) by mouth 2 (two) times daily with a meal.   Fluticasone-Salmeterol 250-50 MCG/DOSE Aepb Commonly known as: Advair Diskus Inhale 1 puff into the lungs 2 (two) times daily.   glimepiride 1 MG tablet Commonly known as: AMARYL TAKE 1 TABLET(1 MG) BY MOUTH DAILY WITH BREAKFAST What changed: See the new instructions.   hydrocerin Crea Apply 1 application topically 2 (two) times daily.   isoniazid 300 MG tablet Commonly known as: NYDRAZID Take 1 tablet (300 mg total) by mouth daily. Start taking on: July 26, 2019   lovastatin 20 MG tablet Commonly known as: MEVACOR TAKE 1 TABLET(20 MG) BY MOUTH DAILY AT 6 PM What changed: See the new instructions.   pyridOXINE 50 MG tablet Commonly known as: B-6 Take 1 tablet (50 mg total) by mouth daily. Start taking on: July 26, 2019   rifampin 300 MG capsule Commonly known as: RIFADIN Take 2 capsules (600 mg total) by mouth daily. Start taking on: July 26, 2019            Durable Medical Equipment  (From admission, onward)         Start     Ordered   07/25/19 1302  For home use only DME oxygen  Once    Question Answer Comment  Length of Need 12 Months   Liters per Minute 2   Frequency Continuous (stationary and portable oxygen unit needed)   Oxygen delivery system Gas      07/25/19 1301   07/25/19 1301  For home use only DME Walker rolling  Once    Question:  Patient needs a walker to treat with the following condition  Answer:  TB (pulmonary tuberculosis)   07/25/19  1301   07/21/19 1406  For home use only DME Bedside commode  Once    Comments: He needs a higher surface.  Question:  Patient needs a bedside commode to treat with the following condition  Answer:  Fear for personal safety   07/21/19 1406   06/02/19 1442  For home use only DME oxygen  Once    Question Answer Comment  Length of Need 6 Months   Mode or (Route) Nasal cannula   Liters per Minute 2   Frequency Continuous (stationary and portable oxygen unit needed)   Oxygen delivery system Gas      06/02/19 1441         Follow-up Information    Reed, Tiffany L, DO. Call in 1 week(s).   Specialty: Geriatric Medicine Why: Hospital follow-up Contact information: [address] [place] [address] [phone]          No Known Allergies  Consultations:  Pulmonology  Infectious disease   Procedures/Studies: Dg Chest Port 1 View  Result Date: 07/21/2019 CLINICAL DATA:  Fever.  Positive TB test. EXAM: PORTABLE CHEST 1 VIEW COMPARISON:  Chest x-ray 07/15/2019, 07/01/2019, 05/19/2019. CT chest 05/15/2019 FINDINGS: Heart size stable.  Severe bilateral pulmonary infiltrates particularly prominent on the left again noted. Slight improvement in aeration in the left upper lung. Worsening aeration in right mid lung. These have been demonstrated to be cavitary and are consistent patient's known diagnosis of TB. No pleural effusion or pneumothorax. IMPRESSION: Severe bilateral pulmonary infiltrates particularly prominent on the left again noted. Slight improvement in aeration in the left upper lung. Worsening aeration in the right mid lung. These infiltrates have been demonstrated to be cavitary inter consistent patient's known diagnosis of TB. Electronically Signed   By: Maisie Fus  Register   On: 07/21/2019 16:47   Dg Chest Port 1 View  Result Date: 07/15/2019 CLINICAL DATA:  Per order- SOB (shortness of breath). Cavitary tuberculosis identified. EXAM: PORTABLE CHEST 1 VIEW COMPARISON:   Radiograph 07/01/2019, CT 05/15/2019 FINDINGS: Stable cardiac silhouette. Multifocal dense nodular airspace opacities in the LEFT upper lobe and LEFT lower lobe are not improved. Nodular opacities in the RIGHT upper lobe are also not improved. Bullous change in the RIGHT lower lobe noted. IMPRESSION: No significant change in bilateral nodular airspace disease consistent with known cavitary tuberculosis. Electronically Signed   By: Genevive Bi M.D.   On: 07/15/2019 09:17   Dg Chest Port 1 View  Result Date: 07/01/2019 CLINICAL DATA:  Shortness of breath. EXAM: PORTABLE CHEST 1 VIEW COMPARISON:  Radiographs of May 19, 2019. FINDINGS: Stable cardiomediastinal silhouette. No pneumothorax or pleural effusion is noted. Continued multifocal airspace opacities are noted in the left lung consistent with pneumonia which may be slightly improved compared to prior exam. Minimal right basilar subsegmental atelectasis is noted. Small right upper lobe airspace opacity is noted which may represent early pneumonia. Bony thorax is unremarkable. IMPRESSION: Continued multifocal airspace opacities are noted in left lung which is slightly improved compared to prior exam, consistent with multifocal pneumonia. Mildly increased right upper lobe opacity is noted concerning for developing pneumonia. Aortic Atherosclerosis (ICD10-I70.0). Electronically Signed   By: Lupita Raider M.D.   On: 07/01/2019 14:10      Subjective: No issues today. Mild cough. Somewhat productive but stable.   Discharge Exam: Vitals:   07/25/19 0603 07/25/19 1216  BP: (!) 151/90 137/72  Pulse: 100 (!) 101  Resp: 19 18  Temp: 99.4 F (37.4 C) 99.2 F (37.3 C)  SpO2: 95% 95%   Vitals:   07/24/19 1600 07/24/19 2142 07/25/19 0603 07/25/19 1216  BP:  (!) 150/85 (!) 151/90 137/72  Pulse: 94 (!) 110 100 (!) 101  Resp:   19 18  Temp:  98.4 F (36.9 C) 99.4 F (37.4 C) 99.2 F (37.3 C)  TempSrc:    Oral  SpO2:  95% 95% 95%  Weight:    58.4 kg   Height:        General: Pt is alert, awake, not in acute distress Cardiovascular: RRR, S1/S2 +, no rubs, no gallops Respiratory: CTA bilaterally, no wheezing, no rhonchi Abdominal: Soft, NT, ND, bowel sounds + Extremities: no edema, no cyanosis    The results of significant diagnostics from this hospitalization (including imaging, microbiology, ancillary and laboratory) are listed below for reference.     Microbiology: Recent Results (from the past 240 hour(s))  Acid Fast Smear (AFB)     Status: Abnormal   Collection Time: 07/18/19  7:04 PM   Specimen: Sputum  Result Value Ref Range Status   AFB Specimen Processing Concentration  Final   Acid Fast Smear Positive (A)  Final    Comment: (NOTE) 2+, 4-36 acid-fast  bacilli per 10 fields at 400X magnification, fluorescent smear REPOTED RESULTS TO NATHAN T ON 07-19-19 AT 1633 FAX CONFORMATION TO [phone] Performed At: [facility] [place], [place] [id number] Rush Farmer MD OZ:[id number]    Source (AFB) SPUTUM  Final    Comment: Performed at [facility], [place] [address] [place], [place] [address]  Culture, blood (routine x 2)     Status: None (Preliminary result)   Collection Time: 07/21/19 10:28 AM   Specimen: BLOOD LEFT ARM  Result Value Ref Range Status   Specimen Description   Final    BLOOD LEFT ARM Performed at [facility] [address] [place], [place] [address]    Special Requests   Final    BOTTLES DRAWN AEROBIC ONLY Blood Culture adequate volume Performed at [facility] [address] [place], [place] [address]    Culture   Final    NO GROWTH 4 DAYS Performed at [facility], [place] [address], [place], [place] [address]    Report Status PENDING  Incomplete  Culture, blood (routine x 2)     Status: None (Preliminary result)   Collection Time: 07/21/19 10:28 AM   Specimen: BLOOD RIGHT ARM  Result Value Ref  Range Status   Specimen Description   Final    BLOOD RIGHT ARM Performed at [facility] [address], [place], [place] [address]    Special Requests   Final    BOTTLES DRAWN AEROBIC ONLY Blood Culture adequate volume Performed at [facility] [address] [place], [place] [address]    Culture   Final    NO GROWTH 4 DAYS Performed at [facility], [place] [address], [place], [place] [address]    Report Status PENDING  Incomplete  Culture, Urine     Status: Abnormal   Collection Time: 07/21/19  2:34 PM   Specimen: Urine, Clean Catch  Result Value Ref Range Status   Specimen Description   Final    URINE, CLEAN CATCH Performed at [facility], [place] [address] [place], [place] [address]    Special Requests   Final    NONE Performed at [facility], [place] [address], [place],  [address]    Culture (A)  Final    >=100,000 COLONIES/mL KLEBSIELLA OXYTOCA >=100,000 COLONIES/mL LACTOBACILLUS SPECIES Standardized susceptibility testing for this organism is not available. Performed at [facility], [place] [address] [place],  [address]    Report Status 07/23/2019 FINAL  Final   Organism ID, Bacteria KLEBSIELLA OXYTOCA (A)  Final      Susceptibility   Klebsiella oxytoca - MIC*    AMPICILLIN >=32 RESISTANT Resistant     CEFAZOLIN >=64 RESISTANT Resistant     CEFTRIAXONE <=1 SENSITIVE Sensitive     CIPROFLOXACIN <=0.25 SENSITIVE Sensitive     GENTAMICIN <=1 SENSITIVE Sensitive     IMIPENEM <=0.25 SENSITIVE Sensitive     NITROFURANTOIN 64 INTERMEDIATE Intermediate     TRIMETH/SULFA <=20 SENSITIVE Sensitive     AMPICILLIN/SULBACTAM >=32 RESISTANT Resistant     PIP/TAZO >=128 RESISTANT Resistant     Extended ESBL NEGATIVE Sensitive     * >=100,000 COLONIES/mL KLEBSIELLA OXYTOCA     Labs: BNP (last 3 results) Recent Labs    05/19/19 1759  BNP 237.6*   Basic Metabolic  Panel: Recent Labs  Lab 07/19/19 0511  NA 134*  K 3.9  CL 101  CO2 23  GLUCOSE 99  BUN 35*  CREATININE 1.29*  CALCIUM 9.1  MG 2.1  PHOS 3.8   Liver Function Tests: Recent Labs  Lab 07/19/19 0511  AST 17  ALT 12  ALKPHOS 68  BILITOT 0.7  PROT 6.5  ALBUMIN 2.9*   No results for input(s): LIPASE, AMYLASE in the last 168 hours. No results for input(s): AMMONIA in the last 168 hours. CBC: Recent Labs  Lab 07/19/19 0511  WBC 7.0  NEUTROABS 4.3  HGB 12.9*  HCT 41.6  MCV 84.6  PLT 204   Cardiac Enzymes: No results for input(s): CKTOTAL, CKMB, CKMBINDEX, TROPONINI in the last 168 hours. BNP: Invalid input(s): POCBNP CBG: No results for input(s): GLUCAP in the last 168 hours. D-Dimer No results for input(s): DDIMER in the last 72 hours. Hgb A1c No results for input(s): HGBA1C in the last 72 hours. Lipid Profile No results for input(s): CHOL, HDL, LDLCALC, TRIG, CHOLHDL, LDLDIRECT in the last 72 hours. Thyroid function studies No results for input(s): TSH, T4TOTAL, T3FREE, THYROIDAB in the last 72 hours.  Invalid input(s): FREET3 Anemia work up No results for input(s): VITAMINB12, FOLATE, FERRITIN, TIBC, IRON, RETICCTPCT in the last 72 hours. Urinalysis    Component Value Date/Time   COLORURINE YELLOW 05/16/2019 0216   APPEARANCEUR CLEAR 05/16/2019 0216   LABSPEC 1.032 (H) 05/16/2019 0216   PHURINE 5.0 05/16/2019 0216   GLUCOSEU NEGATIVE 05/16/2019 0216   HGBUR MODERATE (A) 05/16/2019 0216   BILIRUBINUR NEGATIVE 05/16/2019 0216   KETONESUR NEGATIVE 05/16/2019 0216   PROTEINUR NEGATIVE 05/16/2019 0216   NITRITE NEGATIVE 05/16/2019 0216   LEUKOCYTESUR NEGATIVE 05/16/2019 0216   Sepsis Labs Invalid input(s): PROCALCITONIN,  WBC,  LACTICIDVEN Microbiology Recent Results (from the past 240 hour(s))  Acid Fast Smear (AFB)     Status: Abnormal   Collection Time: 07/18/19  7:04 PM   Specimen: Sputum  Result Value Ref Range Status   AFB Specimen Processing  Concentration  Final   Acid Fast Smear Positive (A)  Final    Comment: (NOTE) 2+, 4-36 acid-fast bacilli per 10 fields at 400X magnification, fluorescent smear REPOTED RESULTS TO NATHAN T ON 07-19-19 AT 1633 FAX CONFORMATION TO [phone] Performed At: [facility] [address] [place], [place] [id number] Jolene Schimke MD MV:[id number]    Source (AFB) SPUTUM  Final    Comment: Performed at [facility], [address], [place], [place] [address]  Culture, blood (routine x 2)     Status: None (Preliminary result)   Collection Time: 07/21/19 10:28 AM   Specimen: BLOOD LEFT ARM  Result Value Ref Range Status   Specimen Description   Final    BLOOD LEFT ARM Performed at [facility], [address], [place], [place] [address]    Special Requests   Final    BOTTLES DRAWN AEROBIC ONLY Blood Culture adequate volume Performed at [facility], [address] [place], [place] [address]    Culture   Final    NO GROWTH 4 DAYS Performed at [facility], [address] [place], [place] [address]    Report Status PENDING  Incomplete  Culture, blood (routine x 2)     Status: None (Preliminary result)   Collection Time: 07/21/19 10:28 AM   Specimen: BLOOD RIGHT ARM  Result Value Ref Range Status   Specimen Description   Final    BLOOD RIGHT ARM Performed at [facility], [address] [place], [place] [address]    Special Requests   Final    BOTTLES DRAWN AEROBIC ONLY Blood Culture adequate volume Performed at [facility], [address], [place], [place] [address]    Culture   Final    NO GROWTH 4 DAYS Performed at [name], [address], [place], [place] [address]    Report Status PENDING  Incomplete  Culture, Urine     Status: Abnormal   Collection Time: 07/21/19  2:34 PM   Specimen: Urine, Clean Catch  Result Value Ref Range Status   Specimen  Description   Final    URINE, CLEAN CATCH Performed at [facility], [address], [place], [place] [address]    Special Requests   Final    NONE Performed at [facility], [address], [place], [place] [address]    Culture (A)  Final    >=100,000 COLONIES/mL KLEBSIELLA OXYTOCA >=100,000 COLONIES/mL LACTOBACILLUS SPECIES Standardized susceptibility testing for this organism is not available. Performed at [facility], [address] [place], [place] [address]    Report Status 07/23/2019 FINAL  Final   Organism ID, Bacteria KLEBSIELLA OXYTOCA (A)  Final      Susceptibility   Klebsiella oxytoca - MIC*    AMPICILLIN >=32 RESISTANT Resistant     CEFAZOLIN >=64 RESISTANT Resistant     CEFTRIAXONE <=1 SENSITIVE Sensitive     CIPROFLOXACIN <=0.25 SENSITIVE Sensitive     GENTAMICIN <=1 SENSITIVE Sensitive     IMIPENEM <=0.25 SENSITIVE Sensitive     NITROFURANTOIN 64 INTERMEDIATE Intermediate     TRIMETH/SULFA <=20 SENSITIVE Sensitive     AMPICILLIN/SULBACTAM >=32 RESISTANT Resistant     PIP/TAZO >=128 RESISTANT Resistant     Extended ESBL NEGATIVE Sensitive     * >=100,000 COLONIES/mL KLEBSIELLA OXYTOCA     Time coordinating discharge: 35 minutes  SIGNED:   Jacquelin Hawking, MD [facility] 07/25/2019, 1:10 PM

## 2019-07-25 NOTE — TOC Transition Note (Signed)
Transition of Care [facility]) - CM/SW Discharge Note   Patient Details  Name: Clarence Maldonado MRN: [id number] Date of Birth: 04-06-1946  Transition of Care [facility]) CM/SW Contact:  Lia Hopping, [place] Phone Number: 07/25/2019, 5:30 PM   Clinical Narrative:   Medication review w RN staff   [facility] arranged to transport. ETA 6:30pm Patient friend notified to call [place] to pick up oxygen concentrator.    Final next level of care: [place] Barriers to Discharge:Barriers Resolved   Patient Goals and CMS Choice Patient states their goals for this hospitalization and ongoing recovery are:: To get moving and feeling better CMS Medicare.gov Compare Post Acute Care list provided to:: Patient Choice offered to / list presented to : Patient  Discharge Placement                  Name of family member notified: Mr. [name] Patient and family notified of of transfer: 07/25/19  Discharge Plan and Services   Discharge Planning Services: CM Consult            DME Arranged: 3-N-1, Gilford Rile rolling DME Agency: [employer] Date DME Agency Contacted: 07/25/19 Time DME Agency Contacted: 58 Representative spoke with at DME Agency: Andree Coss HH Arranged: PT, OT, RN [facility] Agency: Lane Date [place]: 07/25/19 Time [place]: [place] ([place]) Interventions     Readmission Risk Interventions Readmission Risk Prevention Plan 07/25/2019 05/23/2019  Transportation Screening Complete Complete  PCP or Specialist Appt within 3-5 Days Complete Not Complete  Not Complete comments - Not ready for dc  [place] or [facility] Complete Complete  Social Work Consult for [place] Planning/Counseling Complete Not Complete  SW consult not completed comments - nA  Palliative Care Screening Not Applicable Not Applicable  Medication Review Press photographer) Complete Complete  Some recent data might be hidden

## 2019-07-26 ENCOUNTER — Encounter: Payer: Medicare Other | Admitting: Family

## 2019-07-26 ENCOUNTER — Telehealth: Payer: Self-pay | Admitting: *Deleted

## 2019-07-26 ENCOUNTER — Telehealth: Payer: Self-pay

## 2019-07-26 ENCOUNTER — Other Ambulatory Visit: Payer: Self-pay

## 2019-07-26 LAB — CULTURE, BLOOD (ROUTINE X 2)
Culture: NO GROWTH
Culture: NO GROWTH
Special Requests: ADEQUATE
Special Requests: ADEQUATE

## 2019-07-26 NOTE — Progress Notes (Signed)
This service is provided via telemedicine  No vital signs collected/recorded due to the encounter was a telemedicine visit.   Location of patient (ex: home, work):  Home   Patient consents to a telephone visit:  Yes  Location of the provider (ex: office, home):  Office   Name of any referring provider:  [place] of all persons participating in the telemedicine service and their role in the encounter:  Marlowe Sax, NP, Ruthell Rummage CMA, and Daphene Jaeger   Time spent on call:  Ruthell Rummage CMA, spent minutes on phone

## 2019-07-26 NOTE — Telephone Encounter (Signed)
Transition Care Management Follow-up Telephone Call  Date of discharge and from where: 07/25/2019 Clarence Maldonado  How have you been since you were released from the hospital? Better still alittle weak  Any questions or concerns? No   Items Reviewed:  Did the pt receive and understand the discharge instructions provided? Yes   Medications obtained and verified? Yes   Any new allergies since your discharge? No   Dietary orders reviewed? Yes  Do you have support at home? Yes  Friend Mr Clarence Maldonado  Other (ie: DME, Home Health, etc) Home Health to come out but hasn't yet  Functional Questionnaire: (I = Independent and D = Dependent) ADL's: I with assistance  Bathing/Dressing- I with assistance   Meal Prep- D  Eating- I  Maintaining continence- I  Transferring/Ambulation- I with assistance  Managing Meds- I   Follow up appointments reviewed:    PCP Hospital f/u appt confirmed? Yes  Scheduled to see [name] on 07/26/19.  [facility] f/u appt confirmed? No    Are transportation arrangements needed? No   If their condition worsens, is the pt aware to call  their PCP or go to the ED? Yes  Was the patient provided with contact information for the PCP's office or ED? Yes  Was the pt encouraged to call back with questions or concerns? Yes

## 2019-07-26 NOTE — Telephone Encounter (Signed)
Tried many attempts to reach patient. Unsuccessful. Unable to leave message on patient's phone, there is no voicemail set up. Will have to cancel appointment and patient will need to reschedule.

## 2019-07-27 ENCOUNTER — Encounter: Payer: Medicare Other | Admitting: Family

## 2019-07-27 ENCOUNTER — Telehealth: Payer: Self-pay

## 2019-07-27 ENCOUNTER — Other Ambulatory Visit: Payer: Self-pay

## 2019-07-27 ENCOUNTER — Encounter: Payer: Self-pay | Admitting: Family

## 2019-07-27 NOTE — Telephone Encounter (Signed)
Patient was scheduled for [facility] tele visit with [name]. Tried calling patient to go over history, medication, and other information so that he could then speak to provider. Patient was compliant until I tried to go over his medication. Patient became very irritated and confused. Stated he did not know what medications he takes, he has someone coming to give him medication daily. Stated that he was not sure what pharmacy he uses. Patient then handed phone over to his friend Jerlyn Ly. Stated Jenny Reichmann would go over medications. [name] answer phone and states patient did not need an appointment. Patient is recovering and doing just fine. States to tell his provider that he is doing fine. [name] stated he did not want patient to have appointment and wanted appointment to be canceled. Routing to provider.

## 2019-07-27 NOTE — Progress Notes (Signed)
  This encounter was created in error - please disregard.appointment cancelled.

## 2019-07-27 NOTE — Progress Notes (Signed)
This encounter was created in error - please disregard.

## 2019-07-31 NOTE — Progress Notes (Signed)
  This encounter was created in error - please disregard.Pt cancelled appt .

## 2019-08-02 ENCOUNTER — Encounter: Payer: Self-pay | Admitting: *Deleted

## 2019-08-02 ENCOUNTER — Other Ambulatory Visit: Payer: Self-pay | Admitting: *Deleted

## 2019-08-02 NOTE — Patient Outreach (Addendum)
[facility] Customer service manager [facility]) Care Management [facility] CM Telephone Outreach, EMMI Red Alert- General Discharge PCP office completes Transition of Care follow up post-hospital discharge Post-hospital discharge day # 8  08/02/2019  Clarence Maldonado December 30, 1945 [id number]  EMMI- General Discharge RED ON EMMI ALERT EMMI call Day # 4, date of EMMI call 07/31/2019 Red Alert Reason: "Knows who to call for changes in condition, NO"  Successful telephone outreach to Clarence Maldonado, 73 y/o male referred to [facility] CM 08/01/2019 after EMMI Red alert received.  Patient was recently hospitalized from August 10-July 15, 2019 with pulmonary tuberculosis.  Patient was discharged home to self-care with home health services in place through [facility].  Purpose of call discussed with patient and he reports today that he is "doing real good," and he denies pain and new/ recent falls.  States that he is using his walker for ambulation and is independent in his ADL/ iADL's.  States he has a friend who is helping him "manage everything" now that he is home from hospital.  Patient reports he does not remember the automated EMMI call, and reports that he understands to contact his PCP for any changes or problems in his condition.  States that he has been in communication with his PCP post-hospital discharge, and does "not remember" if he has an appointment scheduled- stated that his friend Clarence Maldonado "is handling" his appointments, and should provide transportation for any of his scheduled appointments.  Reports home health team through [facility] is active and have visited him today; he denies concerns around his medications.  Upon asking patient screening questions, he said, "I am tired of everybody at the hospital treating me like I am a baby," and "I am not stupid, I know what I am supposed to be doing."  Encouragement provided to patient and explained that since I am unfamiliar with him as a new patient, the questions I  am asking are important for my to understand how I can help him.  Patient then states that he "does not need any help," and requests that I contact his friend Clarence Maldonado and he provides verbal consent for me to speak with [name] about his health care.  Unfortunately, patient is unable to provide me his friend's phone number and he asks that I call him back in 5 minutes to obtain [name] phone number.  Patient then disconnected call.  While waiting to re-contact patient, I confirmed that Clarence Maldonado is listed as DPR for patient; re-contacted patient and he provided the phone number to reach [name].    1:35 pm: contacted [name], as requested by patient; HIPAA/ identity verified.  [name] tells me that patient is "doing just fine" and reports that he has been "looking out" for patient since his hospital discharge.  States that patient is not currently active with home health team, states, "he turned them away;" [name] reports that "[facility] health Department is handling all of patient's care and are visiting patient every day," and administering his medications and "checking on him."  [name] further reports:  -- patient received his O2 at home and is using, stated "no problems," and confirms that patient is not smoking in his residence -- reports patient's residence is a "RV camper" and that he lives alone and is actually in active quarantine for tuberculosis by the [facility] health department: states that patient "is not allowed to go any where, even to the doctor, which is why the health  Department is coming to see him every day." -- reports that patient is illiterate and "will not agree" to any services available to him, and "this is just the way the patient is." -- confirms that he takes patient's food and "looks in" on patient daily -- advises that I not contact patient again, stating "you are just wasting your time- patient does not understand anything and that is why I am  handling everything;" [name] states "this is the way he has always been, and if I were you, I would do what he asked and leave him alone."  I attempted to explain [facility] CM services to [name], who declines ongoing follow up from [place]; he stated that "the health department has everything under control" and adds that he follows up regularly with the health department nurses that visit patient daily.  Although [name] and patient both decline services today, I was able to provide my direct contact phone number to [name] should he/ patient reconsider and wish to participate in [facility] CM services.  [place] Coordination x 2 1:55 pm:  Attempted to contact [facility] department [phone]) but phone did not conect and I was unable to leave voice message or speak with any one to follow up 2:00 pm: contacted [facility] home health services [phone]); spoke with receptionist, who confirms that patient "turned [place] away and refused all services" and were also able to confirm that patient is visited daily by [facility]; person at [place] indicated that their staff had placed APS report  Addendum: 08/03/2019 10:25 am: discussed patient case with [facility] leadership on 08/02/2019- recommended follow up with APS to ensure report filed based on safety concerns with patient's living conditions.  [place] [phone] and spoke with Clarence Maldonado, who transferred me to Clarence Maldonado who accepted APS report  Plan:  Will make close EMMI program for general discharge and make patient inactive with [facility] CM, as both he and caregiver adamantly decline services today.  Will send patient unsuccessful outreach letter and make patient's PCP aware that he has declined [place], RN, BSN, [place] Coordinator [facility] Care Management  [phone]

## 2019-08-09 ENCOUNTER — Telehealth: Payer: Self-pay

## 2019-08-09 NOTE — Telephone Encounter (Signed)
Clarence Maldonado with [place] adult protective services [phone] calling to see if he has been compliant. Gave her updated notes in chart. She also wanted to see who was actually helping the patient with his meds. Referred her to [facility] services to confirm.

## 2019-08-15 ENCOUNTER — Other Ambulatory Visit: Payer: Self-pay | Admitting: Nurse Practitioner

## 2019-08-16 ENCOUNTER — Other Ambulatory Visit: Payer: Self-pay

## 2019-08-16 DIAGNOSIS — F17200 Nicotine dependence, unspecified, uncomplicated: Secondary | ICD-10-CM

## 2019-08-16 DIAGNOSIS — Z716 Tobacco abuse counseling: Secondary | ICD-10-CM

## 2019-08-16 MED ORDER — BUPROPION HCL ER (XL) 150 MG PO TB24: ORAL_TABLET | ORAL | 1 refills | Status: DC

## 2019-08-21 LAB — ACID FAST CULTURE WITH REFLEXED SENSITIVITIES (MYCOBACTERIA): Acid Fast Culture: POSITIVE — AB

## 2019-08-21 LAB — AFB ORGANISM ID BY DNA PROBE: M tuberculosis complex: POSITIVE — AB

## 2019-08-30 ENCOUNTER — Other Ambulatory Visit: Payer: Self-pay | Admitting: *Deleted

## 2019-08-30 MED ORDER — ALBUTEROL SULFATE HFA 108 (90 BASE) MCG/ACT IN AERS: 1.0000 | INHALATION_SPRAY | RESPIRATORY_TRACT | 3 refills | Status: DC | PRN

## 2019-09-19 ENCOUNTER — Other Ambulatory Visit: Payer: Self-pay

## 2019-09-19 MED ORDER — FLUTICASONE-SALMETEROL 250-50 MCG/DOSE IN AEPB: 1.0000 | INHALATION_SPRAY | Freq: Two times a day (BID) | RESPIRATORY_TRACT | 1 refills | Status: DC

## 2019-09-21 LAB — MAC SUSCEPTIBILITY BROTH
Amikacin: 8
Clarithromycin: 64
Linezolid: 8
Moxifloxacin: 0.5
Streptomycin: 8

## 2019-09-21 LAB — ACID FAST SMEAR (AFB, MYCOBACTERIA): Acid Fast Smear: POSITIVE — AB

## 2019-09-21 LAB — AFB ORGANISM ID BY DNA PROBE
M avium complex: POSITIVE — AB
M tuberculosis complex: NEGATIVE

## 2019-09-21 LAB — ACID FAST CULTURE WITH REFLEXED SENSITIVITIES (MYCOBACTERIA): Acid Fast Culture: POSITIVE — AB

## 2019-09-25 NOTE — Telephone Encounter (Signed)
This encounter was created in error - please disregard.

## 2019-10-04 LAB — ACID FAST CULTURE WITH REFLEXED SENSITIVITIES (MYCOBACTERIA): Acid Fast Culture: POSITIVE — AB

## 2019-10-04 LAB — AFB ORGANISM ID BY DNA PROBE
M avium complex: NEGATIVE
M tuberculosis complex: NEGATIVE

## 2019-10-04 LAB — ACID FAST SMEAR (AFB, MYCOBACTERIA): Acid Fast Smear: POSITIVE — AB

## 2019-10-04 LAB — ORG ID BY SEQUENCING RFLX AST

## 2019-10-05 ENCOUNTER — Other Ambulatory Visit: Payer: Self-pay

## 2019-10-05 ENCOUNTER — Encounter: Payer: Self-pay | Admitting: Nurse Practitioner

## 2019-10-05 ENCOUNTER — Telehealth: Payer: Self-pay

## 2019-10-05 ENCOUNTER — Encounter: Payer: Medicare Other | Admitting: Nurse Practitioner

## 2019-10-05 NOTE — Progress Notes (Signed)
Patient ID: BRYLAN DEC, male   DOB: [birth year], 73 y.o.   MRN: [id number]  A user error has taken place: encounter opened in error, closed for administrative reasons, orders placed in error, not carried out on this patient  Pt not seen, not competent to answer questions without guardian.

## 2019-10-05 NOTE — Telephone Encounter (Signed)
[facility] staff, this pt was scheduled for a telephone medicare wellness visit, he can not do this over the telephone without help. Pt is now under APC thru [place] and he needs to come in office with his guardian to complete this visit.  Also spoke with his emergency contact and he also states pt can not do telephone visits due to his competence.

## 2019-10-07 IMAGING — CT CT HEAD WITHOUT CONTRAST
3 series · 16 of 47 positions shown, 19 images · non-contrast
Comparison: 08/27/2008 head CT

CLINICAL DATA: Head trauma with ataxia

EXAM:
CT HEAD WITHOUT CONTRAST
TECHNIQUE: Contiguous axial images were obtained from the base of the skull
through the vertex without intravenous contrast.

[Series 2: head wo · axial · 0.47mm/px · z∈[-108,+32]mm · 10 of 34 slices shown, 13 images]
[im 3/34  brain]
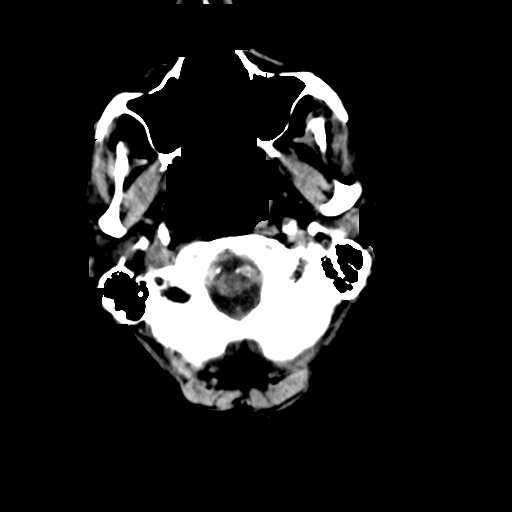
[im 3/34  bone]
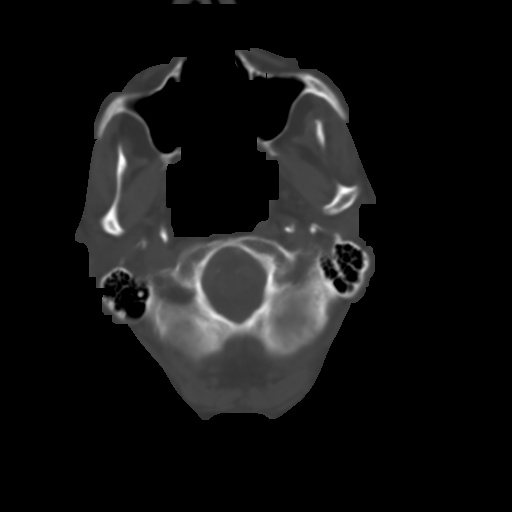
[im 6/34  brain]
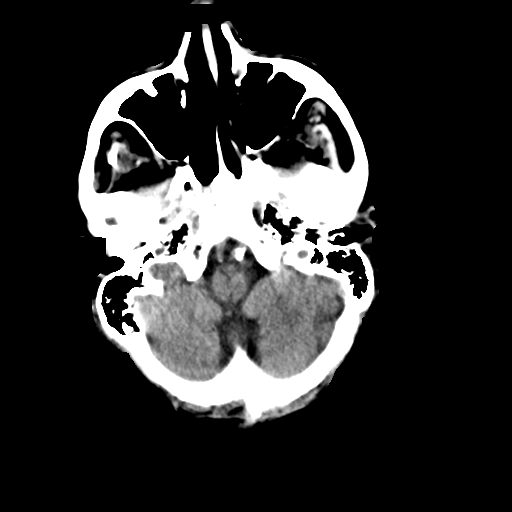
[im 10/34  brain]
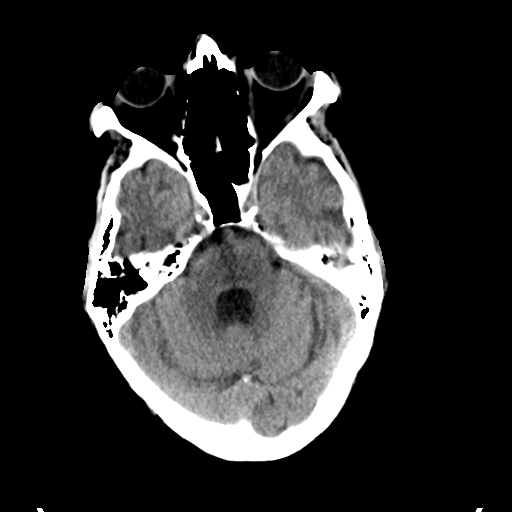
[im 12/34  brain]
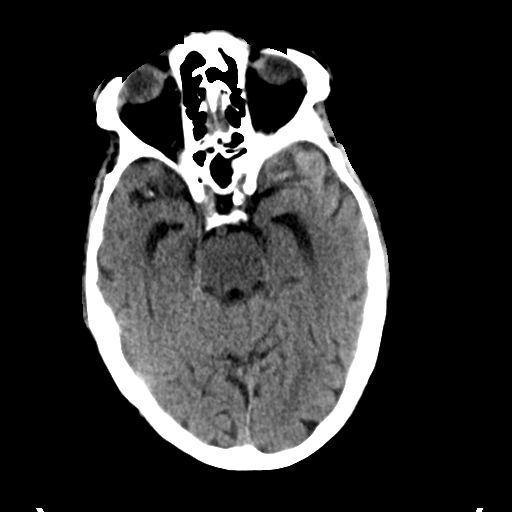
[im 15/34  brain]
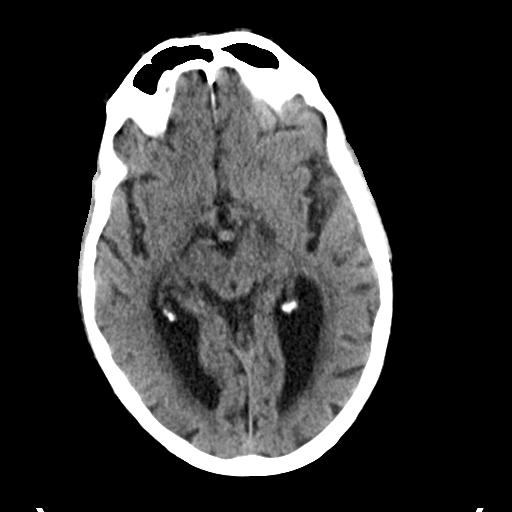
[im 15/34  bone]
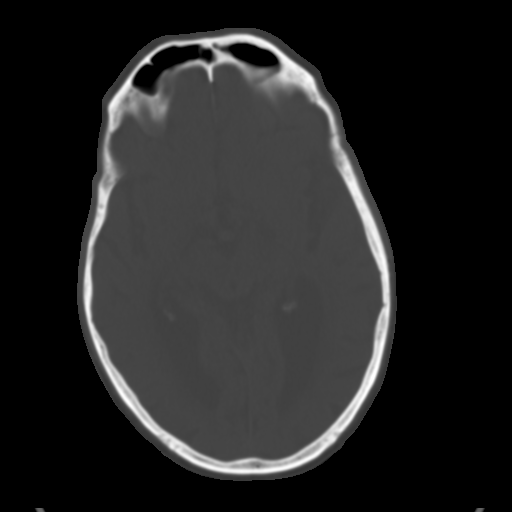
[im 19/34  brain]
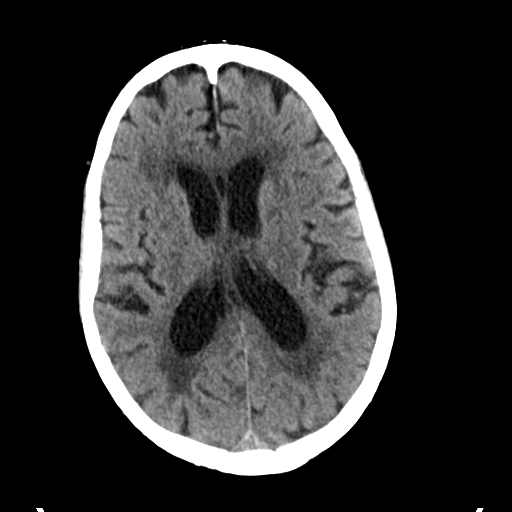
[im 22/34  brain]
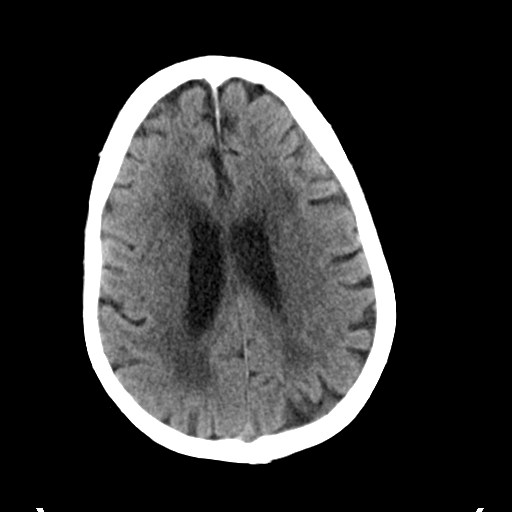
[im 26/34  brain]
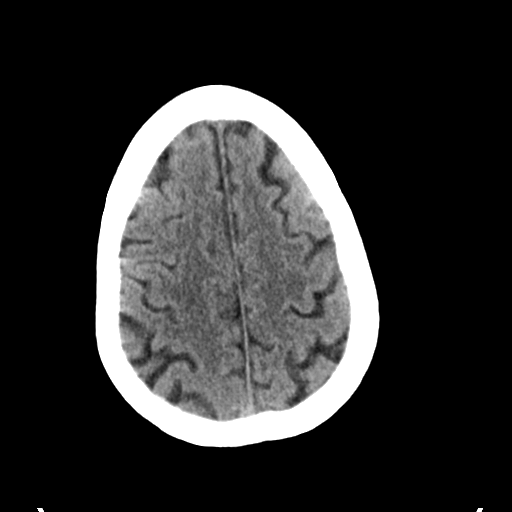
[im 28/34  brain]
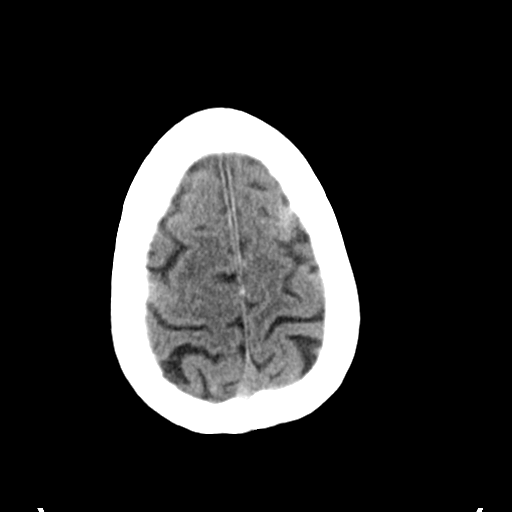
[im 28/34  bone]
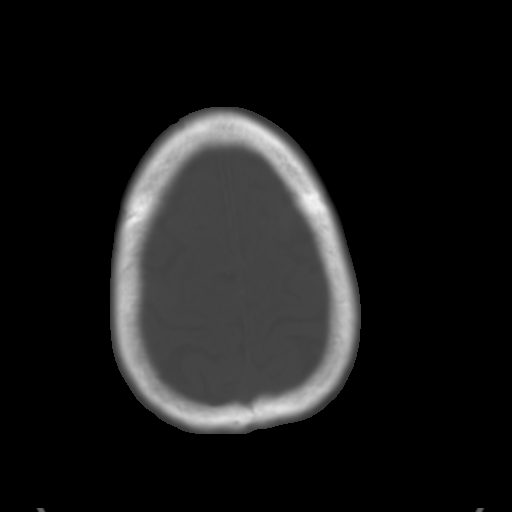
[im 31/34  brain]
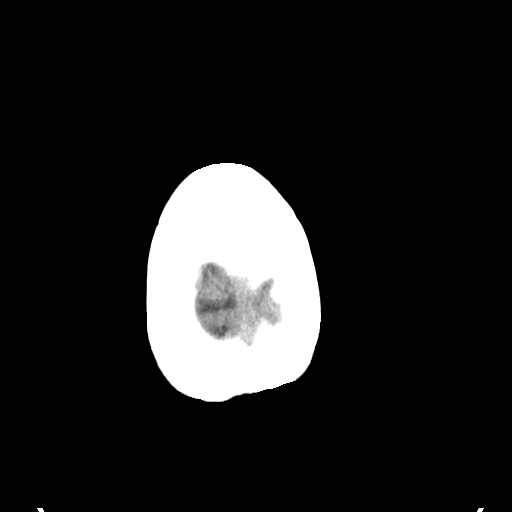

[Series 4: coronal soft tissue · coronal · 0.32mm/px · 3 of 73 slices shown]
[im 25/73  brain]
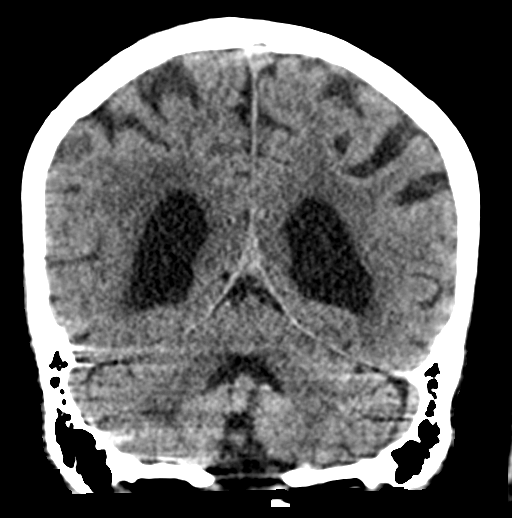
[im 33/73  brain]
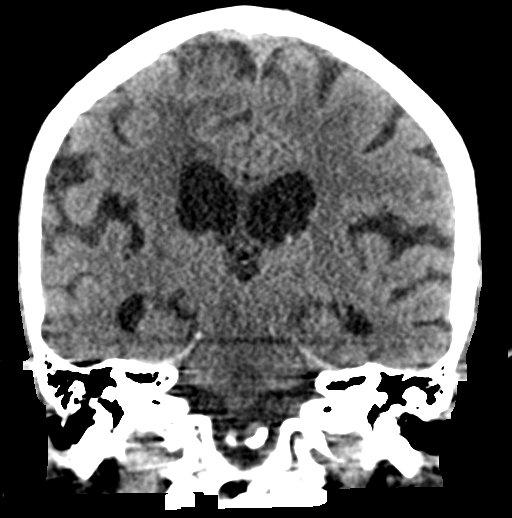
[im 41/73  brain]
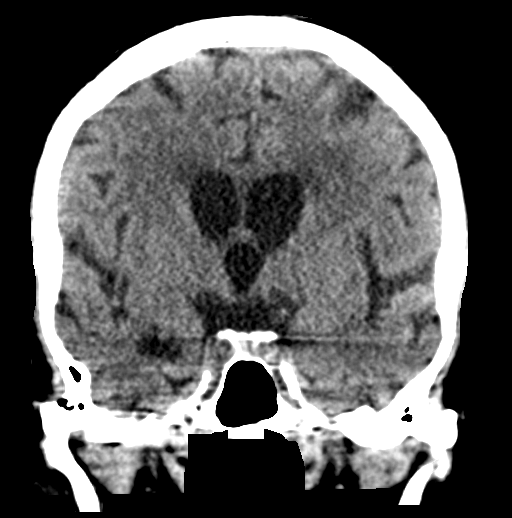

[Series 5: sagittal soft tissue · sagittal · 0.32mm/px · 3 of 51 slices shown]
[im 17/51  brain]
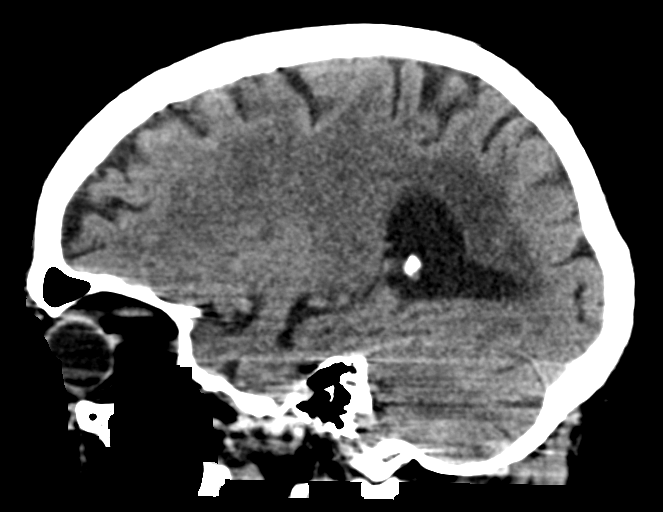
[im 26/51  brain]
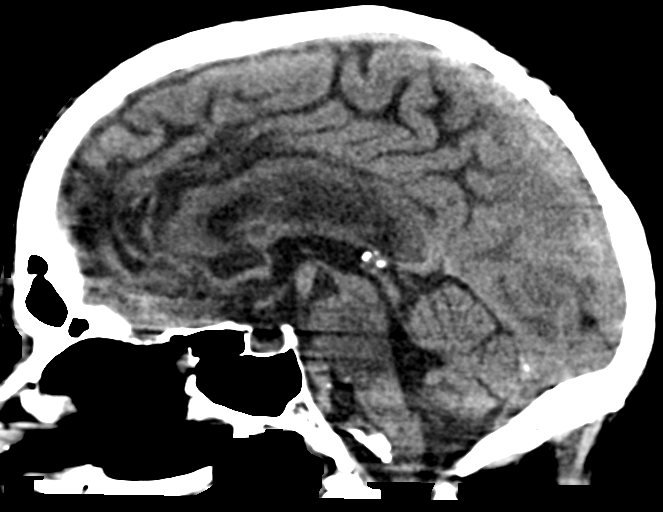
[im 34/51  brain]
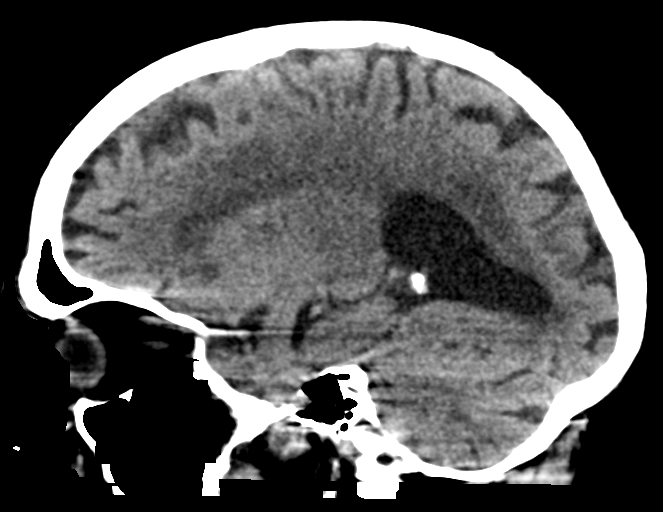

[16 of 47 positions shown; findings below may reference images not displayed]

FINDINGS: Brain: No evidence of acute infarction, hemorrhage, hydrocephalus,
extra-axial collection or mass lesion/mass effect. Mild motion
artifact of the ventral brain causing duplicated appearance of the
falx. Generalized atrophy, central predominant, progressed from
9113. Chronic small vessel ischemia that is confluent in the deep
white matter-progressive. Remote lacunar infarct in the left
thalamus.

Vascular: Atherosclerotic calcification. The calcification has
occurred at the right MCA and basilar since prior.

Skull: Negative for fracture

Sinuses/Orbits: No evidence of injury
IMPRESSION: 1. No evidence of intracranial injury.
2. Atrophy and chronic small vessel ischemia with progression from
3. Mild motion artifact.

## 2019-10-09 NOTE — Telephone Encounter (Signed)
Patient notified appointment cancelled.

## 2019-10-09 NOTE — Telephone Encounter (Signed)
We were previously told that he also could not come to the office b/c he was getting his TB treatments and that Clarence Maldonado could not be with him in his home for that same reason.  Apparently, this AWV will need to be postponed.

## 2019-10-19 ENCOUNTER — Ambulatory Visit: Payer: Self-pay | Admitting: Internal Medicine

## 2019-11-22 ENCOUNTER — Telehealth: Payer: Self-pay

## 2019-11-22 NOTE — Telephone Encounter (Signed)
Clarence Maldonado, patients representative called asking for Dr.Reed to provide order to d/c oxygen and fax to [employer] [facility]. Patient has oxygen sitting in his RV and he has not used it in 2 months or longer.   Patient was recently billed 100.00 dollars for oxygen.  Order needs to be faxed to [phone]  If we need to call [employer] [facility] we can call [phone]

## 2019-11-22 NOTE — Telephone Encounter (Signed)
Spoke with [name] (patients representative). Patient has not completed his TB treatment (about 2 weeks left) although he was told he is not contagious.   [name] will call back to schedule an appointment

## 2019-11-22 NOTE — Telephone Encounter (Signed)
If patient has completed his TB treatment, I'd like for him to be seen so we can test his oxygen before we discontinue it.

## 2019-12-05 ENCOUNTER — Other Ambulatory Visit: Payer: Self-pay | Admitting: Internal Medicine

## 2019-12-08 ENCOUNTER — Ambulatory Visit
Admission: RE | Admit: 2019-12-08 | Discharge: 2019-12-08 | Disposition: A | Discharge status: Home / Self Care | Payer: No Typology Code available for payment source | Source: Ambulatory Visit | Attending: Obstetrics and Gynecology | Admitting: Obstetrics and Gynecology

## 2019-12-08 ENCOUNTER — Other Ambulatory Visit: Payer: Self-pay | Admitting: Obstetrics and Gynecology

## 2019-12-08 DIAGNOSIS — R0602 Shortness of breath: Secondary | ICD-10-CM | POA: Diagnosis not present

## 2019-12-08 DIAGNOSIS — A150 Tuberculosis of lung: Secondary | ICD-10-CM

## 2020-01-26 ENCOUNTER — Encounter (HOSPITAL_COMMUNITY): Payer: Self-pay

## 2020-01-26 ENCOUNTER — Telehealth: Payer: Self-pay | Admitting: *Deleted

## 2020-01-26 ENCOUNTER — Ambulatory Visit: Payer: Medicare Other | Admitting: Nurse Practitioner

## 2020-01-26 ENCOUNTER — Encounter: Payer: Self-pay | Admitting: Nurse Practitioner

## 2020-01-26 ENCOUNTER — Inpatient Hospital Stay (HOSPITAL_COMMUNITY)
Admission: EM | Admit: 2020-01-26 | Discharge: 2020-01-28 | DRG: 291 | Disposition: A | Discharge status: Home / Self Care | Payer: Medicare (Managed Care) | Attending: Internal Medicine | Admitting: Internal Medicine

## 2020-01-26 ENCOUNTER — Emergency Department (HOSPITAL_COMMUNITY): Payer: Medicare (Managed Care)

## 2020-01-26 ENCOUNTER — Other Ambulatory Visit: Payer: Self-pay

## 2020-01-26 DIAGNOSIS — N183 Chronic kidney disease, stage 3 unspecified: Secondary | ICD-10-CM | POA: Diagnosis present

## 2020-01-26 DIAGNOSIS — N179 Acute kidney failure, unspecified: Secondary | ICD-10-CM | POA: Diagnosis present

## 2020-01-26 DIAGNOSIS — I130 Hypertensive heart and chronic kidney disease with heart failure and stage 1 through stage 4 chronic kidney disease, or unspecified chronic kidney disease: Principal | ICD-10-CM | POA: Diagnosis present

## 2020-01-26 DIAGNOSIS — F17200 Nicotine dependence, unspecified, uncomplicated: Secondary | ICD-10-CM | POA: Diagnosis present

## 2020-01-26 DIAGNOSIS — N1832 Chronic kidney disease, stage 3b: Secondary | ICD-10-CM | POA: Diagnosis present

## 2020-01-26 DIAGNOSIS — Z7984 Long term (current) use of oral hypoglycemic drugs: Secondary | ICD-10-CM

## 2020-01-26 DIAGNOSIS — Z87891 Personal history of nicotine dependence: Secondary | ICD-10-CM

## 2020-01-26 DIAGNOSIS — E440 Moderate protein-calorie malnutrition: Secondary | ICD-10-CM | POA: Diagnosis present

## 2020-01-26 DIAGNOSIS — J449 Chronic obstructive pulmonary disease, unspecified: Secondary | ICD-10-CM | POA: Diagnosis present

## 2020-01-26 DIAGNOSIS — I10 Essential (primary) hypertension: Secondary | ICD-10-CM | POA: Diagnosis not present

## 2020-01-26 DIAGNOSIS — N1831 Chronic kidney disease, stage 3a: Secondary | ICD-10-CM

## 2020-01-26 DIAGNOSIS — R627 Adult failure to thrive: Secondary | ICD-10-CM | POA: Diagnosis present

## 2020-01-26 DIAGNOSIS — J9601 Acute respiratory failure with hypoxia: Secondary | ICD-10-CM | POA: Diagnosis present

## 2020-01-26 DIAGNOSIS — I5023 Acute on chronic systolic (congestive) heart failure: Secondary | ICD-10-CM | POA: Diagnosis present

## 2020-01-26 DIAGNOSIS — Z6820 Body mass index (BMI) 20.0-20.9, adult: Secondary | ICD-10-CM

## 2020-01-26 DIAGNOSIS — R079 Chest pain, unspecified: Secondary | ICD-10-CM | POA: Diagnosis present

## 2020-01-26 DIAGNOSIS — E1122 Type 2 diabetes mellitus with diabetic chronic kidney disease: Secondary | ICD-10-CM | POA: Diagnosis not present

## 2020-01-26 DIAGNOSIS — F0390 Unspecified dementia without behavioral disturbance: Secondary | ICD-10-CM | POA: Diagnosis present

## 2020-01-26 DIAGNOSIS — A150 Tuberculosis of lung: Secondary | ICD-10-CM | POA: Diagnosis present

## 2020-01-26 DIAGNOSIS — T502X5A Adverse effect of carbonic-anhydrase inhibitors, benzothiadiazides and other diuretics, initial encounter: Secondary | ICD-10-CM | POA: Diagnosis present

## 2020-01-26 DIAGNOSIS — Z7982 Long term (current) use of aspirin: Secondary | ICD-10-CM

## 2020-01-26 DIAGNOSIS — Z79899 Other long term (current) drug therapy: Secondary | ICD-10-CM

## 2020-01-26 DIAGNOSIS — Z8673 Personal history of transient ischemic attack (TIA), and cerebral infarction without residual deficits: Secondary | ICD-10-CM

## 2020-01-26 DIAGNOSIS — E7849 Other hyperlipidemia: Secondary | ICD-10-CM

## 2020-01-26 DIAGNOSIS — N401 Enlarged prostate with lower urinary tract symptoms: Secondary | ICD-10-CM | POA: Diagnosis present

## 2020-01-26 DIAGNOSIS — Z20822 Contact with and (suspected) exposure to covid-19: Secondary | ICD-10-CM | POA: Diagnosis present

## 2020-01-26 DIAGNOSIS — Z7951 Long term (current) use of inhaled steroids: Secondary | ICD-10-CM

## 2020-01-26 DIAGNOSIS — E785 Hyperlipidemia, unspecified: Secondary | ICD-10-CM | POA: Diagnosis present

## 2020-01-26 LAB — CBC
HCT: 41.5 % (ref 39.0–52.0)
Hemoglobin: 12.8 g/dL — ABNORMAL LOW (ref 13.0–17.0)
MCH: 25.7 pg — ABNORMAL LOW (ref 26.0–34.0)
MCHC: 30.8 g/dL (ref 30.0–36.0)
MCV: 83.3 fL (ref 80.0–100.0)
Platelets: 217 10*3/uL (ref 150–400)
RBC: 4.98 MIL/uL (ref 4.22–5.81)
RDW: 16.7 % — ABNORMAL HIGH (ref 11.5–15.5)
WBC: 6.7 10*3/uL (ref 4.0–10.5)
nRBC: 0 % (ref 0.0–0.2)

## 2020-01-26 LAB — BASIC METABOLIC PANEL
Anion gap: 11 (ref 5–15)
BUN: 27 mg/dL — ABNORMAL HIGH (ref 8–23)
CO2: 28 mmol/L (ref 22–32)
Calcium: 9.4 mg/dL (ref 8.9–10.3)
Chloride: 103 mmol/L (ref 98–111)
Creatinine, Ser: 1.43 mg/dL — ABNORMAL HIGH (ref 0.61–1.24)
GFR calc Af Amer: 56 mL/min — ABNORMAL LOW (ref >60)
GFR calc non Af Amer: 48 mL/min — ABNORMAL LOW (ref >60)
Glucose, Bld: 133 mg/dL — ABNORMAL HIGH (ref 70–99)
Potassium: 3.9 mmol/L (ref 3.5–5.1)
Sodium: 142 mmol/L (ref 135–145)

## 2020-01-26 LAB — TROPONIN I (HIGH SENSITIVITY)
Troponin I (High Sensitivity): 65 ng/L — ABNORMAL HIGH (ref <18)
Troponin I (High Sensitivity): 66 ng/L — ABNORMAL HIGH (ref <18)
Troponin I (High Sensitivity): 58 ng/L — ABNORMAL HIGH (ref <18)
Troponin I (High Sensitivity): 60 ng/L — ABNORMAL HIGH (ref <18)

## 2020-01-26 LAB — GLUCOSE, CAPILLARY: Glucose-Capillary: 109 mg/dL — ABNORMAL HIGH (ref 70–99)

## 2020-01-26 LAB — LIPID PANEL
Cholesterol: 148 mg/dL (ref 0–200)
HDL: 38 mg/dL — ABNORMAL LOW (ref >40)
LDL Cholesterol: 101 mg/dL — ABNORMAL HIGH (ref 0–99)
Total CHOL/HDL Ratio: 3.9 RATIO
Triglycerides: 45 mg/dL (ref <150)
VLDL: 9 mg/dL (ref 0–40)

## 2020-01-26 LAB — RESPIRATORY PANEL BY RT PCR (FLU A&B, COVID)
Influenza A by PCR: NEGATIVE
Influenza B by PCR: NEGATIVE
SARS Coronavirus 2 by RT PCR: NEGATIVE

## 2020-01-26 LAB — BRAIN NATRIURETIC PEPTIDE: B Natriuretic Peptide: 1683.7 pg/mL — ABNORMAL HIGH (ref 0.0–100.0)

## 2020-01-26 LAB — D-DIMER, QUANTITATIVE: D-Dimer, Quant: 1.63 ug/mL-FEU — ABNORMAL HIGH (ref 0.00–0.50)

## 2020-01-26 LAB — APTT: aPTT: 36 seconds (ref 24–36)

## 2020-01-26 LAB — PROTIME-INR
INR: 1 (ref 0.8–1.2)
Prothrombin Time: 13 seconds (ref 11.4–15.2)

## 2020-01-26 MED ORDER — HEPARIN (PORCINE) 25000 UT/250ML-% IV SOLN
1000.0000 [IU]/h | INTRAVENOUS | Status: DC
  Administered 2020-01-26: 800 [IU]/h via INTRAVENOUS
  Filled 2020-01-26: qty 250

## 2020-01-26 MED ORDER — ACETAMINOPHEN 325 MG PO TABS: 650.0000 mg | ORAL_TABLET | ORAL | Status: DC | PRN

## 2020-01-26 MED ORDER — ASPIRIN EC 81 MG PO TBEC
81.0000 mg | DELAYED_RELEASE_TABLET | Freq: Two times a day (BID) | ORAL | Status: DC
  Administered 2020-01-26 – 2020-01-28 (×4): 81 mg via ORAL
  Filled 2020-01-26 (×4): qty 1

## 2020-01-26 MED ORDER — LISINOPRIL 20 MG PO TABS
20.0000 mg | ORAL_TABLET | Freq: Every day | ORAL | Status: DC
  Administered 2020-01-27: 20 mg via ORAL
  Filled 2020-01-26: qty 1

## 2020-01-26 MED ORDER — ISONIAZID 300 MG PO TABS
300.0000 mg | ORAL_TABLET | Freq: Every day | ORAL | Status: DC
  Administered 2020-01-26 – 2020-01-28 (×3): 300 mg via ORAL
  Filled 2020-01-26 (×3): qty 1

## 2020-01-26 MED ORDER — HYDROCHLOROTHIAZIDE 25 MG PO TABS
25.0000 mg | ORAL_TABLET | Freq: Every day | ORAL | Status: DC
  Administered 2020-01-27: 25 mg via ORAL
  Filled 2020-01-26: qty 1

## 2020-01-26 MED ORDER — INSULIN ASPART 100 UNIT/ML ~~LOC~~ SOLN
0.0000 [IU] | Freq: Three times a day (TID) | SUBCUTANEOUS | Status: DC
  Administered 2020-01-28: 1 [IU] via SUBCUTANEOUS

## 2020-01-26 MED ORDER — BUPROPION HCL ER (XL) 150 MG PO TB24
150.0000 mg | ORAL_TABLET | Freq: Every day | ORAL | Status: DC
  Administered 2020-01-26 – 2020-01-28 (×3): 150 mg via ORAL
  Filled 2020-01-26 (×3): qty 1

## 2020-01-26 MED ORDER — ZOLPIDEM TARTRATE 5 MG PO TABS: 5.0000 mg | ORAL_TABLET | Freq: Every evening | ORAL | Status: DC | PRN

## 2020-01-26 MED ORDER — PRAVASTATIN SODIUM 20 MG PO TABS
20.0000 mg | ORAL_TABLET | Freq: Every day | ORAL | Status: DC
  Administered 2020-01-26 – 2020-01-28 (×3): 20 mg via ORAL
  Filled 2020-01-26 (×3): qty 1

## 2020-01-26 MED ORDER — IOHEXOL 350 MG/ML SOLN
100.0000 mL | Freq: Once | INTRAVENOUS | Status: AC | PRN
  Administered 2020-01-26: 18:00:00 100 mL via INTRAVENOUS

## 2020-01-26 MED ORDER — ASPIRIN 81 MG PO CHEW
324.0000 mg | CHEWABLE_TABLET | Freq: Once | ORAL | Status: AC
  Administered 2020-01-26: 324 mg via ORAL
  Filled 2020-01-26: qty 4

## 2020-01-26 MED ORDER — VITAMIN B-6 50 MG PO TABS
50.0000 mg | ORAL_TABLET | Freq: Every day | ORAL | Status: DC
  Administered 2020-01-26 – 2020-01-28 (×3): 50 mg via ORAL
  Filled 2020-01-26 (×3): qty 1

## 2020-01-26 MED ORDER — ONDANSETRON HCL 4 MG/2ML IJ SOLN: 4.0000 mg | Freq: Four times a day (QID) | INTRAMUSCULAR | Status: DC | PRN

## 2020-01-26 MED ORDER — TRAZODONE HCL 50 MG PO TABS
50.0000 mg | ORAL_TABLET | Freq: Once | ORAL | Status: AC
  Administered 2020-01-27: 50 mg via ORAL
  Filled 2020-01-26: qty 1

## 2020-01-26 MED ORDER — SODIUM CHLORIDE 0.45 % IV SOLN: INTRAVENOUS | Status: DC

## 2020-01-26 MED ORDER — GLIMEPIRIDE 1 MG PO TABS
1.0000 mg | ORAL_TABLET | Freq: Every day | ORAL | Status: DC
  Administered 2020-01-27 – 2020-01-28 (×2): 1 mg via ORAL
  Filled 2020-01-26 (×3): qty 1

## 2020-01-26 MED ORDER — LISINOPRIL-HYDROCHLOROTHIAZIDE 20-25 MG PO TABS: 1.0000 | ORAL_TABLET | Freq: Every day | ORAL | Status: DC

## 2020-01-26 MED ORDER — HEPARIN BOLUS VIA INFUSION
4000.0000 [IU] | Freq: Once | INTRAVENOUS | Status: AC
  Administered 2020-01-26: 4000 [IU] via INTRAVENOUS
  Filled 2020-01-26: qty 4000

## 2020-01-26 MED ORDER — SODIUM CHLORIDE (PF) 0.9 % IJ SOLN
INTRAMUSCULAR | Status: AC
  Filled 2020-01-26: qty 50

## 2020-01-26 MED ORDER — NICOTINE 21 MG/24HR TD PT24
21.0000 mg | MEDICATED_PATCH | Freq: Every day | TRANSDERMAL | Status: DC
  Administered 2020-01-27 – 2020-01-28 (×3): 21 mg via TRANSDERMAL
  Filled 2020-01-26 (×3): qty 1

## 2020-01-26 MED ORDER — RIFAMPIN 300 MG PO CAPS
600.0000 mg | ORAL_CAPSULE | Freq: Every day | ORAL | Status: DC
  Administered 2020-01-26 – 2020-01-28 (×3): 600 mg via ORAL
  Filled 2020-01-26 (×3): qty 2

## 2020-01-26 MED ORDER — MOMETASONE FURO-FORMOTEROL FUM 200-5 MCG/ACT IN AERO
2.0000 | INHALATION_SPRAY | Freq: Two times a day (BID) | RESPIRATORY_TRACT | Status: DC
  Administered 2020-01-26 – 2020-01-28 (×4): 2 via RESPIRATORY_TRACT
  Filled 2020-01-26: qty 8.8

## 2020-01-26 MED ORDER — INSULIN ASPART 100 UNIT/ML ~~LOC~~ SOLN: 0.0000 [IU] | Freq: Every day | SUBCUTANEOUS | Status: DC

## 2020-01-26 MED ORDER — ALBUTEROL SULFATE (2.5 MG/3ML) 0.083% IN NEBU: 3.0000 mL | INHALATION_SOLUTION | Freq: Four times a day (QID) | RESPIRATORY_TRACT | Status: DC | PRN

## 2020-01-26 MED ORDER — SODIUM CHLORIDE 0.9% FLUSH: 3.0000 mL | Freq: Once | INTRAVENOUS | Status: DC

## 2020-01-26 MED ORDER — FERROUS SULFATE 325 (65 FE) MG PO TABS
325.0000 mg | ORAL_TABLET | Freq: Two times a day (BID) | ORAL | Status: DC
  Administered 2020-01-27 – 2020-01-28 (×4): 325 mg via ORAL
  Filled 2020-01-26 (×4): qty 1

## 2020-01-26 NOTE — Progress Notes (Signed)
ANTICOAGULATION CONSULT NOTE - Initial Consult  Pharmacy Consult for heparin Indication: chest pain/ACS  No Known Allergies  Patient Measurements: Height: 5\' 6"  (167.6 cm) Weight: 68 kg (150 lb) IBW/kg (Calculated) : 63.8 Heparin Dosing Weight: 68kg  Vital Signs: Temp: 97.8 F (36.6 C) (04/23 1431) Temp Source: Oral (04/23 1431) BP: 147/79 (04/23 1815) Pulse Rate: 96 (04/23 1815)  Labs: Recent Labs    01/26/20 1500 01/26/20 1700  HGB 12.8*  --   HCT 41.5  --   PLT 217  --   CREATININE 1.43*  --   TROPONINIHS 65* 66*    Estimated Creatinine Clearance: 41.5 mL/min (A) (by C-G formula based on SCr of 1.43 mg/dL (H)).   Medical History: Past Medical History:  Diagnosis Date  . Abnormal CT of the chest 2009   nodule 9 x 6 mm RUL  . Benign localized hyperplasia of prostate with urinary obstruction and other lower urinary tract symptoms (LUTS)(600.21)   . Cardiomegaly   . Cerebrovascular accident (stroke) (HCC) 2009   lacunar infarct in the left thalamus  . Cholelithiasis   . Chronic airway obstruction, not elsewhere classified   . Chronic kidney disease, stage III (moderate)   . Closed femur fracture (HCC) 1960  . Illiterate   . Obesity, unspecified   . Other and unspecified hyperlipidemia   . Tobacco use disorder   . Type II or unspecified type diabetes mellitus with renal manifestations, uncontrolled(250.42)   . Unspecified essential hypertension       Assessment: 74 year old male presents with chest pain or shortness of breath.  He states this has been ongoing for about a week or so.  Pharmacy consulted to dose heparin.  No prior AC noted  01/26/2020  H/H WNL Plts WNL Scr 1.43 APTT and pt/INR ordered STAT  Goal of Therapy:  Heparin level 0.3-0.7 units/ml Monitor platelets by anticoagulation protocol   Plan:  Heparin bolus 4000 units x 1 then start heparin drip at 800 units/hr Heparin level in 8 hours Daily heparin level and CBC  01/28/2020  RPh 01/26/2020, 7:08 PM

## 2020-01-26 NOTE — Progress Notes (Signed)
Per Dr. Mikeal Hawthorne, patient has been treated by the Health Department and does not require TB precautions.

## 2020-01-26 NOTE — Progress Notes (Signed)
Patient is requesting sleep aid and nicotine patch for 1 pack a day smoker. Paged Linton Flemings

## 2020-01-26 NOTE — Progress Notes (Signed)
Patient was positive for TB 07/18/2019. Patient confused about his health history and denies ever having tuberculosis. Paged Dr. Mikeal Hawthorne to inquire if respiratory precautions are required.

## 2020-01-26 NOTE — ED Provider Notes (Signed)
[facility] DEPT Provider Note   CSN: [id number] Arrival date & time: 01/26/20  1418     History Chief Complaint  Patient presents with  . Chest Pain  . Shortness of Breath    Clarence Maldonado is a 74 y.o. male.  HPI 74 year old male presents with chest pain or shortness of breath.  He states this has been ongoing for about a week or so.  Comes and goes.  He states the chest pain feels like a shocking sensation in his left upper chest.  At first he notes a bruise to his left upper chest and then followed the pain.  The bruise is gone.  This pain seems to come and go randomly multiple times throughout the day.  Shortness of breath also comes with this but also prominently comes whenever he lays flat or exerts himself with any minimal exertion.  No leg swelling.  No back pain or abdominal pain.  No significant cough or fever.   Past Medical History:  Diagnosis Date  . Abnormal CT of the chest 2009   nodule 9 x 6 mm RUL  . Benign localized hyperplasia of prostate with urinary obstruction and other lower urinary tract symptoms (LUTS)(600.21)   . Cardiomegaly   . Cerebrovascular accident (stroke) ([place]) 2009   lacunar infarct in the left thalamus  . Cholelithiasis   . Chronic airway obstruction, not elsewhere classified   . Chronic kidney disease, stage III (moderate)   . Closed femur fracture ([place]) 1960  . Illiterate   . Obesity, unspecified   . Other and unspecified hyperlipidemia   . Tobacco use disorder   . Type II or unspecified type diabetes mellitus with renal manifestations, uncontrolled(250.42)   . Unspecified essential hypertension     Patient Active Problem List   Diagnosis Date Noted  . Acute lower UTI 07/23/2019  . Fatigue   . Lung infiltrate   . Pulmonary tuberculosis with cavitation 06/06/2019  . Moderate protein-calorie malnutrition ([place]) 06/06/2019  . Hypokalemia 05/15/2019  . Anemia 05/15/2019  . Hypermetropia of both eyes  03/21/2018  . Weight loss 03/21/2018  . Hyperlipidemia associated with type 2 diabetes mellitus ([place]) 03/21/2018  . Insomnia 02/03/2017  . Cognitive change 01/29/2016  . Illiterate 01/03/2014  . Controlled type 2 diabetes mellitus with stage 3 chronic kidney disease, without long-term current use of insulin ([place]) 01/05/2013  . Stranguria 01/05/2013  . Essential hypertension 01/04/2013  . CKD (chronic kidney disease) stage 3, GFR 30-59 ml/min (HCC) 01/04/2013  . Hyperlipidemia 01/04/2013  . Tobacco use disorder 01/04/2013  . BPH without obstruction/lower urinary tract symptoms 01/04/2013    Past Surgical History:  Procedure Laterality Date  . FEMUR FRACTURE SURGERY Left 1960       Family History  Problem Relation Age of Onset  . Heart disease Mother     Social History   Tobacco Use  . Smoking status: Former Smoker    Packs/day: 0.50    Years: 60.00    Pack years: 30.00    Types: Cigarettes  . Smokeless tobacco: Never Used  . Tobacco comment: quit smoking 3 month days   Substance Use Topics  . Alcohol use: Yes    Alcohol/week: 1.0 standard drinks    Types: 1 Cans of beer per week  . Drug use: No    Home Medications Prior to Admission medications   Medication Sig Start Date End Date Taking? Authorizing Provider  albuterol (VENTOLIN HFA) 108 (90 Base) MCG/ACT inhaler INHALE  1 TO 2 PUFFS BY MOUTH EVERY 4 TO 6 HOURS AS NEEDED FOR BREATHING 12/05/19   Reed, Tiffany L, DO  aspirin EC 81 MG tablet Take 81 mg by mouth 2 (two) times daily.    [provider]  buPROPion (WELLBUTRIN XL) 150 MG 24 hr tablet TAKE 1 TABLET(150 MG) BY MOUTH DAILY 08/16/19   Reed, Tiffany L, DO  ferrous sulfate 325 (65 FE) MG tablet Take 1 tablet (325 mg total) by mouth 2 (two) times daily with a meal. 07/25/19   Narda Bonds, MD  Fluticasone-Salmeterol (ADVAIR DISKUS) 250-50 MCG/DOSE AEPB Inhale 1 puff into the lungs 2 (two) times daily. 09/19/19   Reed, Tiffany L, DO  glimepiride  (AMARYL) 1 MG tablet TAKE 1 TABLET(1 MG) BY MOUTH DAILY WITH BREAKFAST Patient taking differently: Take 1 mg by mouth daily with breakfast.  04/12/19   Reed, Tiffany L, DO  hydrocerin (EUCERIN) CREA Apply 1 application topically 2 (two) times daily. 07/25/19   Narda Bonds, MD  isoniazid (NYDRAZID) 300 MG tablet Take 1 tablet (300 mg total) by mouth daily. 07/26/19   Narda Bonds, MD  lovastatin (MEVACOR) 20 MG tablet TAKE 1 TABLET(20 MG) BY MOUTH DAILY AT 6 PM Patient taking differently: Take 20 mg by mouth daily at 6 PM.  04/12/19   Reed, Tiffany L, DO  pyridOXINE (B-6) 50 MG tablet Take 1 tablet (50 mg total) by mouth daily. 07/26/19   Narda Bonds, MD  rifampin (RIFADIN) 300 MG capsule Take 2 capsules (600 mg total) by mouth daily. 07/26/19   Narda Bonds, MD    Allergies    Patient has no known allergies.  Review of Systems   Review of Systems  Constitutional: Negative for fever.  Respiratory: Positive for shortness of breath. Negative for cough.   Cardiovascular: Positive for chest pain. Negative for leg swelling.  Gastrointestinal: Negative for abdominal pain and vomiting.  Musculoskeletal: Negative for back pain.  All other systems reviewed and are negative.   Physical Exam Updated Vital Signs BP (!) 152/87   Pulse 95   Temp 97.8 F (36.6 C) (Oral)   Resp 16   Ht 5\' 6"  (1.676 m)   Wt 68 kg   SpO2 93%   BMI 24.21 kg/m   Physical Exam Vitals and nursing note reviewed.  Constitutional:      General: He is not in acute distress.    Appearance: He is well-developed. He is not ill-appearing or diaphoretic.  HENT:     Head: Normocephalic and atraumatic.     Right Ear: External ear normal.     Left Ear: External ear normal.     Nose: Nose normal.  Eyes:     General:        Right eye: No discharge.        Left eye: No discharge.  Cardiovascular:     Rate and Rhythm: Normal rate and regular rhythm.     Heart sounds: Normal heart sounds.  Pulmonary:      Effort: Pulmonary effort is normal.     Breath sounds: Normal breath sounds.  Abdominal:     Palpations: Abdomen is soft.     Tenderness: There is no abdominal tenderness.  Musculoskeletal:     Cervical back: Neck supple.     Right lower leg: No edema.     Left lower leg: No edema.  Skin:    General: Skin is warm and dry.  Neurological:  Mental Status: He is alert.  Psychiatric:        Mood and Affect: Mood is not anxious.     ED Results / Procedures / Treatments   Labs (all labs ordered are listed, but only abnormal results are displayed) Labs Reviewed  BASIC METABOLIC PANEL - Abnormal; Notable for the following components:      Result Value   Glucose, Bld 133 (*)    BUN 27 (*)    Creatinine, Ser 1.43 (*)    GFR calc non Af Amer 48 (*)    GFR calc Af Amer 56 (*)    All other components within normal limits  CBC - Abnormal; Notable for the following components:   Hemoglobin 12.8 (*)    MCH 25.7 (*)    RDW 16.7 (*)    All other components within normal limits  BRAIN NATRIURETIC PEPTIDE - Abnormal; Notable for the following components:   B Natriuretic Peptide 1,683.7 (*)    All other components within normal limits  D-DIMER, QUANTITATIVE (NOT AT [facility]) - Abnormal; Notable for the following components:   D-Dimer, Quant 1.63 (*)    All other components within normal limits  TROPONIN I (HIGH SENSITIVITY) - Abnormal; Notable for the following components:   Troponin I (High Sensitivity) 65 (*)    All other components within normal limits  TROPONIN I (HIGH SENSITIVITY)    EKG EKG Interpretation  Date/Time:  Friday January 26 2020 14:28:20 EDT Ventricular Rate:  102 PR Interval:    QRS Duration: 97 QT Interval:  371 QTC Calculation: 484 R Axis:   70 Text Interpretation: Sinus tachycardia Probable LVH with secondary repol abnrm Borderline prolonged QT interval Confirmed by Pricilla Loveless [phone]) on 01/26/2020 2:52:00 PM   Radiology DG Chest 2 View  Result Date:  01/26/2020 CLINICAL DATA:  Shortness of breath. Pt with recent TB treatment. EXAM: CHEST - 2 VIEW COMPARISON:  Chest radiograph 12/08/2019 FINDINGS: Stable cardiomediastinal contours. Aortic arch calcifications. Extensive bilateral left greater than right coarse interstitial opacities, similar to prior. No new focal consolidation. Persistent mild left pleural thickening or effusion. No pneumothorax. No acute finding in the visualized skeleton. IMPRESSION: 1. No acute cardiopulmonary process. 2. Extensive bilateral left greater than right coarse interstitial opacities, similar to prior, likely post infectious scarring. Persistent mild left pleural thickening or effusion. Electronically Signed   By: Emmaline Kluver M.D.   On: 01/26/2020 15:49    Procedures Procedures (including critical care time)  Medications Ordered in ED Medications  sodium chloride flush (NS) 0.9 % injection 3 mL (0 mLs Intravenous Hold 01/26/20 1459)  aspirin chewable tablet 324 mg (324 mg Oral Given 01/26/20 1626)    ED Course  I have reviewed the triage vital signs and the nursing notes.  Pertinent labs & imaging results that were available during my care of the patient were reviewed by me and considered in my medical decision making (see chart for details).    MDM Rules/Calculators/A&P                      Patient is not in acute distress but is having on and off vague chest pain.  Troponin is gray zone elevated with a level of 65.  Will need repeat.  BNP is also elevated which would help explain his orthopnea, but he otherwise does not really appear fluid overloaded.  Work-up for PE is obtained and currently CT is pending.  He will need admission after this.  Care  to Dr. Fredderick Phenix.  [facility] health department nurse,  Nadyne Coombes, called and updated patient's treatment regimen for his tuberculosis.  He is not currently contagious but needs maintenance therapy with INH 450 mg, Rifampin 600 mg, Vit B6 50 mg, Claritin 10  mg Final Clinical Impression(s) / ED Diagnoses Final diagnoses:  None    Rx / DC Orders ED Discharge Orders    None       Pricilla Loveless, MD 01/26/20 1654

## 2020-01-26 NOTE — Telephone Encounter (Signed)
I called patient to access his concerns for his appointment that was made at 1:30 with [facility] for Chest pains, SOB and arm pain.   Patient stated that it is not currently happening but it is on and off for the last 2 days. Stated that he is having severe pain running down his arms and SOB and severe chest pains. Stated that they don't last long but when it does come on it takes his breath away.   I advised patient to go ahead and go to the ER to be evaluated. I told him to call 911 and go. He stated that he wasn't going to call 911 because his friend [name] is coming to pick him up and he will have him just take him on to the hospital. I confirmed with patient that he will go and be evaluated and he stated that YES he would.   Forwarded message to [place] and canceled appointment.

## 2020-01-26 NOTE — Telephone Encounter (Signed)
Noted thank you

## 2020-01-26 NOTE — ED Provider Notes (Signed)
CT scan shows no evidence of PE.  There is improving TB infection.  His second troponin was mildly increased as compared to his first.  He has an elevated heart score.  I spoke with Dr. Mikeal Hawthorne who will admit the patient for further treatment.   Rolan Bucco, MD 01/26/20 [phone]

## 2020-01-26 NOTE — ED Triage Notes (Signed)
Patient c/o SOB, chest pain x 2 days.  Patient denies any N/V or diaphoresis.

## 2020-01-26 NOTE — H&P (Signed)
History and Physical   Clarence Maldonado HWE:[id number] DOB: 30-Aug-1946 DOA: 01/26/2020  Referring MD/NP/PA: Dr. Fredderick Phenix  PCP: [name], DO   Outpatient Specialists: Health department  Patient coming from: Home  Chief Complaint: Chest pain  HPI: Clarence Maldonado is a 74 y.o. male with medical history significant of COPD, tobacco abuse, morbid obesity, diabetes, hypertension, history of CVA, BPH, pulmonary tuberculosis currently on treatment directly without therapy who came to the ER complaining of substernal chest pain.  This is associated with shortness of breath.  Symptoms have been going on for about 1 to 2 weeks.  No fever or chills.  No contact with anybody with Covid.  Patient also denies diaphoresis.  No PND orthopnea.  Patient had work-up done including CT angiogram of the chest that was negative.  His initial troponin was low but second troponin appears to be rising.  Patient is therefore being admitted with possible cardiac source of chest pain.  He has got risk factors for coronary artery disease so he is being admitted for observation and treatment..  ED Course: Temperature 98.5 blood pressure 160/90 pulse 101 respirate of 21 oxygen sat 92% on room air.  Initial troponin 58-second troponin 60.  Fasting lipid panel showed LDL of 101.  COVID-19 screen negative.  CT angiogram showed persistent but improved widespread bilateral nodular consolidation and cavitation.  This is chronic.  CBC is entirely within normal.  Glucose is 133.  BNP of 1683.  Patient will be admitted for rule out MI.  Review of Systems: As per HPI otherwise 10 point review of systems negative.    Past Medical History:  Diagnosis Date  . Abnormal CT of the chest 2009   nodule 9 x 6 mm RUL  . Benign localized hyperplasia of prostate with urinary obstruction and other lower urinary tract symptoms (LUTS)(600.21)   . Cardiomegaly   . Cerebrovascular accident (stroke) (HCC) 2009   lacunar infarct in the left  thalamus  . Cholelithiasis   . Chronic airway obstruction, not elsewhere classified   . Chronic kidney disease, stage III (moderate)   . Closed femur fracture (HCC) 1960  . Illiterate   . Obesity, unspecified   . Other and unspecified hyperlipidemia   . Tobacco use disorder   . Type II or unspecified type diabetes mellitus with renal manifestations, uncontrolled(250.42)   . Unspecified essential hypertension     Past Surgical History:  Procedure Laterality Date  . FEMUR FRACTURE SURGERY Left 1960     reports that he has quit smoking. His smoking use included cigarettes. He has a 30.00 pack-year smoking history. He has never used smokeless tobacco. He reports current alcohol use of about 1.0 standard drinks of alcohol per week. He reports that he does not use drugs.  No Known Allergies  Family History  Problem Relation Age of Onset  . Heart disease Mother      Prior to Admission medications   Medication Sig Start Date End Date Taking? Authorizing Provider  albuterol (VENTOLIN HFA) 108 (90 Base) MCG/ACT inhaler INHALE 1 TO 2 PUFFS BY MOUTH EVERY 4 TO 6 HOURS AS NEEDED FOR BREATHING Patient taking differently: Inhale 2 puffs into the lungs every 6 (six) hours as needed for wheezing or shortness of breath.  12/05/19  Yes Reed, Tiffany L, DO  aspirin EC 81 MG tablet Take 81 mg by mouth 2 (two) times daily.   Yes [provider]  buPROPion (WELLBUTRIN XL) 150 MG 24 hr tablet TAKE 1  TABLET(150 MG) BY MOUTH DAILY 08/16/19  Yes Reed, Tiffany L, DO  ferrous sulfate 325 (65 FE) MG tablet Take 1 tablet (325 mg total) by mouth 2 (two) times daily with a meal. 07/25/19  Yes Narda Bonds, MD  Fluticasone-Salmeterol (ADVAIR DISKUS) 250-50 MCG/DOSE AEPB Inhale 1 puff into the lungs 2 (two) times daily. 09/19/19  Yes Reed, Tiffany L, DO  glimepiride (AMARYL) 1 MG tablet TAKE 1 TABLET(1 MG) BY MOUTH DAILY WITH BREAKFAST Patient taking differently: Take 1 mg by mouth daily with breakfast.   04/12/19  Yes Reed, Tiffany L, DO  isoniazid (NYDRAZID) 300 MG tablet Take 1 tablet (300 mg total) by mouth daily. 07/26/19  Yes Narda Bonds, MD  lisinopril-hydrochlorothiazide (ZESTORETIC) 20-25 MG tablet Take 1 tablet by mouth daily. 11/17/19  Yes [provider]  lovastatin (MEVACOR) 20 MG tablet TAKE 1 TABLET(20 MG) BY MOUTH DAILY AT 6 PM Patient taking differently: Take 20 mg by mouth daily at 6 PM.  04/12/19  Yes Reed, Tiffany L, DO  pyridOXINE (B-6) 50 MG tablet Take 1 tablet (50 mg total) by mouth daily. 07/26/19  Yes Narda Bonds, MD  rifampin (RIFADIN) 300 MG capsule Take 2 capsules (600 mg total) by mouth daily. 07/26/19  Yes Narda Bonds, MD  hydrocerin (EUCERIN) CREA Apply 1 application topically 2 (two) times daily. Patient not taking: Reported on 01/26/2020 07/25/19   Narda Bonds, MD    Physical Exam: Vitals:   01/26/20 1630 01/26/20 1700 01/26/20 1730 01/26/20 1815  BP: (!) 152/87 (!) 149/88 (!) 145/89 (!) 147/79  Pulse: 95 91 89 96  Resp: 16  20 (!) 21  Temp:      TempSrc:      SpO2: 93% 93% 93% 93%  Weight:      Height:          Constitutional: Anxious, no acute distress Vitals:   01/26/20 1630 01/26/20 1700 01/26/20 1730 01/26/20 1815  BP: (!) 152/87 (!) 149/88 (!) 145/89 (!) 147/79  Pulse: 95 91 89 96  Resp: 16  20 (!) 21  Temp:      TempSrc:      SpO2: 93% 93% 93% 93%  Weight:      Height:       Eyes: PERRL, lids and conjunctivae normal ENMT: Mucous membranes are moist. Posterior pharynx clear of any exudate or lesions.Normal dentition.  Neck: normal, supple, no masses, no thyromegaly Respiratory: clear to auscultation bilaterally, no wheezing, no crackles. Normal respiratory effort. No accessory muscle use.  Cardiovascular: Sinus tachycardia, no murmurs / rubs / gallops. No extremity edema. 2+ pedal pulses. No carotid bruits.  Abdomen: no tenderness, no masses palpated. No hepatosplenomegaly. Bowel sounds positive.  Musculoskeletal:  no clubbing / cyanosis. No joint deformity upper and lower extremities. Good ROM, no contractures. Normal muscle tone.  Skin: no rashes, lesions, ulcers. No induration Neurologic: CN 2-12 grossly intact. Sensation intact, DTR normal. Strength 5/5 in all 4.  Psychiatric: Normal judgment and insight. Alert and oriented x 3. Normal mood.     Labs on Admission: I have personally reviewed following labs and imaging studies  CBC: Recent Labs  Lab 01/26/20 1500  WBC 6.7  HGB 12.8*  HCT 41.5  MCV 83.3  PLT 217   Basic Metabolic Panel: Recent Labs  Lab 01/26/20 1500  NA 142  K 3.9  CL 103  CO2 28  GLUCOSE 133*  BUN 27*  CREATININE 1.43*  CALCIUM 9.4   GFR: Estimated  Creatinine Clearance: 41.5 mL/min (A) (by C-G formula based on SCr of 1.43 mg/dL (H)). Liver Function Tests: No results for input(s): AST, ALT, ALKPHOS, BILITOT, PROT, ALBUMIN in the last 168 hours. No results for input(s): LIPASE, AMYLASE in the last 168 hours. No results for input(s): AMMONIA in the last 168 hours. Coagulation Profile: No results for input(s): INR, PROTIME in the last 168 hours. Cardiac Enzymes: No results for input(s): CKTOTAL, CKMB, CKMBINDEX, TROPONINI in the last 168 hours. BNP (last 3 results) No results for input(s): PROBNP in the last 8760 hours. HbA1C: No results for input(s): HGBA1C in the last 72 hours. CBG: No results for input(s): GLUCAP in the last 168 hours. Lipid Profile: No results for input(s): CHOL, HDL, LDLCALC, TRIG, CHOLHDL, LDLDIRECT in the last 72 hours. Thyroid Function Tests: No results for input(s): TSH, T4TOTAL, FREET4, T3FREE, THYROIDAB in the last 72 hours. Anemia Panel: No results for input(s): VITAMINB12, FOLATE, FERRITIN, TIBC, IRON, RETICCTPCT in the last 72 hours. Urine analysis:    Component Value Date/Time   COLORURINE YELLOW 05/16/2019 0216   APPEARANCEUR CLEAR 05/16/2019 0216   LABSPEC 1.032 (H) 05/16/2019 0216   PHURINE 5.0 05/16/2019 0216    GLUCOSEU NEGATIVE 05/16/2019 0216   HGBUR MODERATE (A) 05/16/2019 0216   BILIRUBINUR NEGATIVE 05/16/2019 0216   KETONESUR NEGATIVE 05/16/2019 0216   PROTEINUR NEGATIVE 05/16/2019 0216   NITRITE NEGATIVE 05/16/2019 0216   LEUKOCYTESUR NEGATIVE 05/16/2019 0216   Sepsis Labs: @LABRCNTIP (procalcitonin:4,lacticidven:4) )No results found for this or any previous visit (from the past 240 hour(s)).   Radiological Exams on Admission: DG Chest 2 View  Result Date: 01/26/2020 CLINICAL DATA:  Shortness of breath. Pt with recent TB treatment. EXAM: CHEST - 2 VIEW COMPARISON:  Chest radiograph 12/08/2019 FINDINGS: Stable cardiomediastinal contours. Aortic arch calcifications. Extensive bilateral left greater than right coarse interstitial opacities, similar to prior. No new focal consolidation. Persistent mild left pleural thickening or effusion. No pneumothorax. No acute finding in the visualized skeleton. IMPRESSION: 1. No acute cardiopulmonary process. 2. Extensive bilateral left greater than right coarse interstitial opacities, similar to prior, likely post infectious scarring. Persistent mild left pleural thickening or effusion. Electronically Signed   By: 02/07/2020 M.D.   On: 01/26/2020 15:49   CT Angio Chest PE W and/or Wo Contrast  Result Date: 01/26/2020 CLINICAL DATA:  Short of breath and chest pain for 2 days EXAM: CT ANGIOGRAPHY CHEST WITH CONTRAST TECHNIQUE: Multidetector CT imaging of the chest was performed using the standard protocol during bolus administration of intravenous contrast. Multiplanar CT image reconstructions and MIPs were obtained to evaluate the vascular anatomy. CONTRAST:  01/28/2020 OMNIPAQUE IOHEXOL 350 MG/ML SOLN COMPARISON:  01/26/2020, 05/15/2019 FINDINGS: Cardiovascular: This is a technically adequate evaluation of the pulmonary vasculature. There are no acute filling defects or pulmonary emboli. The heart is mildly enlarged without pericardial effusion. Atherosclerosis  of the aorta and coronary vasculature again noted unchanged. Normal caliber of the thoracic aorta. Mediastinum/Nodes: Stable subcentimeter mediastinal lymph nodes without pathologic adenopathy. Narrowing of the bilateral mainstem bronchi likely as a result of tracheal bronchomalacia. No bronchial wall thickening. The thyroid and esophagus are unremarkable. Lungs/Pleura: Bilateral areas of pleural and parenchymal scarring are again noted. A large cavity seen within the lung apices on prior study have decreased in size. Overall, the nodular areas of consolidation seen on prior study have all decreased in size and conspicuity since previous study, likely reflecting improved atypical infection such as fungal. Please correlate with any previous pulmonology workup. There are  no new areas of consolidation. Trace bilateral pleural effusions are noted. No pneumothorax. There is background emphysema. Upper Abdomen: Calcified gallstones are identified. Left renal atrophy again noted. Musculoskeletal: No acute or destructive bony lesions. Reconstructed images demonstrate no additional findings. Review of the MIP images confirms the above findings. IMPRESSION: 1. Persistent but improved widespread bilateral nodular consolidation and cavitation. Overall, findings would favor improved atypical infection such as fungal pneumonia. Please correlate with any previous pulmonology workup or sputum analysis. 2. No evidence of acute pulmonary embolus. 3. Trace bilateral pleural effusions. 4. Aortic Atherosclerosis (ICD10-I70.0) and Emphysema (ICD10-J43.9). Electronically Signed   By: Randa Ngo M.D.   On: 01/26/2020 18:23    EKG: Independently reviewed.  Shows sinus tachycardia with a rate of 102, evidence of LVH by voltage criteria, no significant ST changes  Assessment/Plan Principal Problem:   Chest pain Active Problems:   Essential hypertension   CKD (chronic kidney disease) stage 3, GFR 30-59 ml/min (HCC)    Hyperlipidemia   Tobacco use disorder   Controlled type 2 diabetes mellitus with stage 3 chronic kidney disease, without long-term current use of insulin (HCC)   Pulmonary tuberculosis with cavitation   Moderate protein-calorie malnutrition ([place])     #1 chest pain: Atypical but patient has risk factors.  Enzymes slightly elevated but looks flat.  I will initiate heparin and admit for observation.  Continue with aspirin and home regimen.  Get echocardiogram in the morning.  If no worsening enzymes and if all tests come back normal chest pain is likely to be due to his ongoing lung issues and not the heart.  #2 pulmonary tuberculosis: He has cavitary lesion currently on directly observed therapy through the health department.  We will continue with his INH and rifampin.  #3 hypertension: Continue lisinopril hydrochlorothiazide.  #4 hyperlipidemia: Fasting lipid panel looks good now.  Continue pravastatin.  #5 diabetes: Continue glimepiride with sliding scale insulin.  #6 tobacco abuse: Add nicotine patch.   DVT prophylaxis: Heparin drip Code Status: Full code Family Communication: No family at bedside Disposition Plan: Home Consults called: None Admission status: Observation  Severity of Illness: The appropriate patient status for this patient is OBSERVATION. Observation status is judged to be reasonable and necessary in order to provide the required intensity of service to ensure the patient's safety. The patient's presenting symptoms, physical exam findings, and initial radiographic and laboratory data in the context of their medical condition is felt to place them at decreased risk for further clinical deterioration. Furthermore, it is anticipated that the patient will be medically stable for discharge from the hospital within 2 midnights of admission. The following factors support the patient status of observation.   " The patient's presenting symptoms include chest pain. " The  physical exam findings include no significant findings on exam. " The initial radiographic and laboratory data are elevated troponin.     Barbette Merino MD [facility] Pager [phone]  If 7PM-7AM, please contact night-coverage [email or web address] Password [facility]  01/26/2020, 6:51 PM

## 2020-01-27 ENCOUNTER — Encounter (HOSPITAL_COMMUNITY): Payer: Self-pay | Admitting: Internal Medicine

## 2020-01-27 ENCOUNTER — Observation Stay (HOSPITAL_COMMUNITY): Payer: Medicare (Managed Care)

## 2020-01-27 DIAGNOSIS — E1122 Type 2 diabetes mellitus with diabetic chronic kidney disease: Secondary | ICD-10-CM | POA: Diagnosis present

## 2020-01-27 DIAGNOSIS — J449 Chronic obstructive pulmonary disease, unspecified: Secondary | ICD-10-CM | POA: Diagnosis present

## 2020-01-27 DIAGNOSIS — Z8673 Personal history of transient ischemic attack (TIA), and cerebral infarction without residual deficits: Secondary | ICD-10-CM | POA: Diagnosis not present

## 2020-01-27 DIAGNOSIS — Z79899 Other long term (current) drug therapy: Secondary | ICD-10-CM | POA: Diagnosis not present

## 2020-01-27 DIAGNOSIS — Z87891 Personal history of nicotine dependence: Secondary | ICD-10-CM | POA: Diagnosis not present

## 2020-01-27 DIAGNOSIS — I351 Nonrheumatic aortic (valve) insufficiency: Secondary | ICD-10-CM | POA: Diagnosis not present

## 2020-01-27 DIAGNOSIS — N179 Acute kidney failure, unspecified: Secondary | ICD-10-CM | POA: Diagnosis present

## 2020-01-27 DIAGNOSIS — Z6820 Body mass index (BMI) 20.0-20.9, adult: Secondary | ICD-10-CM | POA: Diagnosis not present

## 2020-01-27 DIAGNOSIS — Z20822 Contact with and (suspected) exposure to covid-19: Secondary | ICD-10-CM | POA: Diagnosis present

## 2020-01-27 DIAGNOSIS — N183 Chronic kidney disease, stage 3 unspecified: Secondary | ICD-10-CM | POA: Diagnosis not present

## 2020-01-27 DIAGNOSIS — E785 Hyperlipidemia, unspecified: Secondary | ICD-10-CM | POA: Diagnosis present

## 2020-01-27 DIAGNOSIS — N401 Enlarged prostate with lower urinary tract symptoms: Secondary | ICD-10-CM | POA: Diagnosis present

## 2020-01-27 DIAGNOSIS — Z7982 Long term (current) use of aspirin: Secondary | ICD-10-CM | POA: Diagnosis not present

## 2020-01-27 DIAGNOSIS — I341 Nonrheumatic mitral (valve) prolapse: Secondary | ICD-10-CM

## 2020-01-27 DIAGNOSIS — Z7951 Long term (current) use of inhaled steroids: Secondary | ICD-10-CM | POA: Diagnosis not present

## 2020-01-27 DIAGNOSIS — Z7984 Long term (current) use of oral hypoglycemic drugs: Secondary | ICD-10-CM | POA: Diagnosis not present

## 2020-01-27 DIAGNOSIS — E440 Moderate protein-calorie malnutrition: Secondary | ICD-10-CM | POA: Diagnosis present

## 2020-01-27 DIAGNOSIS — I5023 Acute on chronic systolic (congestive) heart failure: Secondary | ICD-10-CM | POA: Diagnosis present

## 2020-01-27 DIAGNOSIS — I10 Essential (primary) hypertension: Secondary | ICD-10-CM | POA: Diagnosis not present

## 2020-01-27 DIAGNOSIS — R079 Chest pain, unspecified: Secondary | ICD-10-CM | POA: Diagnosis present

## 2020-01-27 DIAGNOSIS — T502X5A Adverse effect of carbonic-anhydrase inhibitors, benzothiadiazides and other diuretics, initial encounter: Secondary | ICD-10-CM | POA: Diagnosis present

## 2020-01-27 DIAGNOSIS — F0390 Unspecified dementia without behavioral disturbance: Secondary | ICD-10-CM | POA: Diagnosis present

## 2020-01-27 DIAGNOSIS — J9601 Acute respiratory failure with hypoxia: Secondary | ICD-10-CM | POA: Diagnosis present

## 2020-01-27 DIAGNOSIS — I130 Hypertensive heart and chronic kidney disease with heart failure and stage 1 through stage 4 chronic kidney disease, or unspecified chronic kidney disease: Secondary | ICD-10-CM | POA: Diagnosis present

## 2020-01-27 DIAGNOSIS — N1832 Chronic kidney disease, stage 3b: Secondary | ICD-10-CM | POA: Diagnosis present

## 2020-01-27 DIAGNOSIS — A150 Tuberculosis of lung: Secondary | ICD-10-CM | POA: Diagnosis present

## 2020-01-27 DIAGNOSIS — R627 Adult failure to thrive: Secondary | ICD-10-CM | POA: Diagnosis present

## 2020-01-27 DIAGNOSIS — R071 Chest pain on breathing: Secondary | ICD-10-CM | POA: Diagnosis not present

## 2020-01-27 LAB — CBC
HCT: 38.5 % — ABNORMAL LOW (ref 39.0–52.0)
Hemoglobin: 11.8 g/dL — ABNORMAL LOW (ref 13.0–17.0)
MCH: 26.2 pg (ref 26.0–34.0)
MCHC: 30.6 g/dL (ref 30.0–36.0)
MCV: 85.4 fL (ref 80.0–100.0)
Platelets: 196 10*3/uL (ref 150–400)
RBC: 4.51 MIL/uL (ref 4.22–5.81)
RDW: 17 % — ABNORMAL HIGH (ref 11.5–15.5)
WBC: 6.3 10*3/uL (ref 4.0–10.5)
nRBC: 0 % (ref 0.0–0.2)

## 2020-01-27 LAB — GLUCOSE, CAPILLARY
Glucose-Capillary: 71 mg/dL (ref 70–99)
Glucose-Capillary: 102 mg/dL — ABNORMAL HIGH (ref 70–99)
Glucose-Capillary: 104 mg/dL — ABNORMAL HIGH (ref 70–99)
Glucose-Capillary: 51 mg/dL — ABNORMAL LOW (ref 70–99)
Glucose-Capillary: 143 mg/dL — ABNORMAL HIGH (ref 70–99)

## 2020-01-27 LAB — HEPARIN LEVEL (UNFRACTIONATED): Heparin Unfractionated: <0.1 IU/mL — ABNORMAL LOW (ref 0.30–0.70)

## 2020-01-27 LAB — ECHOCARDIOGRAM COMPLETE
Height: 70 in
Weight: 2335.11 oz

## 2020-01-27 LAB — HEMOGLOBIN A1C
Hgb A1c MFr Bld: 6.4 % — ABNORMAL HIGH (ref 4.8–5.6)
Mean Plasma Glucose: 136.98 mg/dL

## 2020-01-27 MED ORDER — HEPARIN SODIUM (PORCINE) 5000 UNIT/ML IJ SOLN
5000.0000 [IU] | Freq: Three times a day (TID) | INTRAMUSCULAR | Status: DC
  Administered 2020-01-27 – 2020-01-28 (×3): 5000 [IU] via SUBCUTANEOUS
  Filled 2020-01-27 (×3): qty 1

## 2020-01-27 MED ORDER — FUROSEMIDE 10 MG/ML IJ SOLN
40.0000 mg | Freq: Every day | INTRAMUSCULAR | Status: DC
  Administered 2020-01-27: 16:00:00 40 mg via INTRAVENOUS
  Filled 2020-01-27: qty 4

## 2020-01-27 NOTE — Progress Notes (Addendum)
[facility]                                                                              Patient Demographics  Clarence Maldonado, is a 74 y.o. male, DOB - 1946/02/17, QRF:[id number]  Admit date - 01/26/2020   Admitting Physician Rometta Emery, MD  Outpatient Primary MD for the patient is [name], DO  Outpatient specialists:   LOS - 0  days   Medical records reviewed and are as summarized below:    Chief Complaint  Patient presents with  . Chest Pain  . Shortness of Breath       Brief summary   Clarence Maldonado is a 74 y.o. male with medical history significant of COPD, tobacco abuse, morbid obesity, diabetes, hypertension, history of CVA, BPH, pulmonary tuberculosis currently on treatment presented to ED with substernal chest pain.  Patient also reported shortness of breath, going on for 1 to 2 weeks, no fevers or chills.  No diaphoresis, no PND or orthopnea.  CT angiogram of the chest was negative for PE Patient was admitted for acute hypoxic respiratory failure, chest pain rule out COVID-19 test negative   Assessment & Plan    Principal Problem: Atypical chest pain, acute respiratory failure with hypoxia History of pulmonary TB with cavitation -CT angiogram of the chest was negative for PE, persistent but improved widespread bilateral nodular consolidation and cavitation (known history of TB) -Troponins flat 60-58-66, 2D echo pending, currently no chest pain.  DC IV heparin -Elevated BNP, short of breath during exam bilateral coarse breath sounds.  Elevated BNP 1683.7 -Not on O2 at baseline, home O2 evaluation was done, O2 sats 85% on room air on ambulation -Placed on IV Lasix 40 mg daily, follow 2D echo results, strict I's and O's and daily weights.  DC IV fluids ADDENDUM:  2Decho with EF 30-35%, global hypokinesis. Prior echo in 05/2019 with normal EF.  -consulted cardiology  Active Problems:   Pulmonary tuberculosis with cavitation -CT  chest reviewed, currently on DOT through health department, continue INH, rifampin, pyridoxine    Essential hypertension -BP currently stable -Starting on IV Lasix.  Hold lisinopril, HCTZ  Mild acute on CKD (chronic kidney disease) stage 3, GFR 30-59 ml/min (HCC) -Baseline creatinine 1.2-1.3 -Presented with creatinine of 1.43, recheck bmet    Hyperlipidemia -Continue pravastatin    Tobacco use disorder -Continue nicotine patch    Controlled type 2 diabetes mellitus with stage 3 chronic kidney disease, without long-term current use of insulin (HCC) -Continue sliding scale insulin     Moderate protein-calorie malnutrition (HCC) Estimated body mass index is 20.94 kg/m as calculated from the following:   Height as of this encounter: 5\' 10"  (1.778 m).   Weight as of this encounter: 66.2 kg.   Failure to thrive, dementia -Patient appears to have some confusion and dementia, states his friend is helping with his care, otherwise lives alone.  PT evaluation   Code Status: Full CODE STATUS DVT Prophylaxis: DC IV heparin drip, placed on subcu heparin Family Communication: Discussed all imaging results, lab results, explained to the patient  Disposition Plan:  Status is: Observation  The patient remains OBS appropriate and will d/c before 2 midnights.  Dispo: The patient is from: Home              Anticipated d/c is to: Home              Anticipated d/c date is: 1 day              Patient currently is not medically stable to d/c.      Time Spent in minutes 35 minutes  Procedures:  Awaiting 2D echo  Consultants:   None  Antimicrobials:   Anti-infectives (From admission, onward)   Start     Dose/Rate Route Frequency Ordered Stop   01/26/20 2200  isoniazid (NYDRAZID) tablet 300 mg     300 mg Oral Daily 01/26/20 2031     01/26/20 2200  rifampin (RIFADIN) capsule 600 mg     600 mg Oral Daily 01/26/20 2031            Medications  Scheduled Meds: . aspirin EC   81 mg Oral BID  . buPROPion  150 mg Oral Daily  . ferrous sulfate  325 mg Oral BID WC  . furosemide  40 mg Intravenous Daily  . glimepiride  1 mg Oral Q breakfast  . insulin aspart  0-5 Units Subcutaneous QHS  . insulin aspart  0-9 Units Subcutaneous TID WC  . isoniazid  300 mg Oral Daily  . lisinopril  20 mg Oral Daily  . mometasone-formoterol  2 puff Inhalation BID  . nicotine  21 mg Transdermal Daily  . pravastatin  20 mg Oral q1800  . pyridOXINE  50 mg Oral Daily  . rifampin  600 mg Oral Daily  . sodium chloride flush  3 mL Intravenous Once   Continuous Infusions: . heparin 1,000 Units/hr (01/27/20 0639)   PRN Meds:.acetaminophen, albuterol, ondansetron (ZOFRAN) IV, zolpidem      Subjective:   Clarence Maldonado was seen and examined today.  On exam, still has shortness of breath with coarse breath sounds bilaterally.  No chest pain at the time of my examination.  No fevers or chills. Patient denies dizziness, abdominal pain, N/V/D/C, new weakness, numbess, tingling. No acute events overnight.    Objective:   Vitals:   01/27/20 0640 01/27/20 0806 01/27/20 1053 01/27/20 1242  BP:  (!) 159/92  (!) 146/92  Pulse:  97  (!) 102  Resp:  18  16  Temp:  97.8 F (36.6 C)  97.8 F (36.6 C)  TempSrc:  Oral  Oral  SpO2: 94% 97% 95% 98%  Weight:      Height:        Intake/Output Summary (Last 24 hours) at 01/27/2020 1340 Last data filed at 01/27/2020 1300 Gross per 24 hour  Intake 1433.57 ml  Output 600 ml  Net 833.57 ml     Wt Readings from Last 3 Encounters:  01/26/20 66.2 kg  07/25/19 58.4 kg  03/23/19 73.9 kg     Exam  General: Alert and oriented x self and place somewhat tangential and confused  Cardiovascular: S1 S2 auscultated, no murmurs, RRR  Respiratory: Coarse breath sounds bilaterally  Gastrointestinal: Soft, nontender, nondistended, + bowel sounds  Ext: no pedal edema bilaterally  Neuro: No new FND's  Musculoskeletal: No digital cyanosis,  clubbing  Skin: No rashes  Psych: Somewhat confused   Data Reviewed:  I have personally reviewed following labs and imaging studies  Micro Results Recent Results (from the  past 240 hour(s))  Respiratory Panel by RT PCR (Flu A&B, Covid) - Nasopharyngeal Swab     Status: None   Collection Time: 01/26/20  6:42 PM   Specimen: Nasopharyngeal Swab  Result Value Ref Range Status   SARS Coronavirus 2 by RT PCR NEGATIVE NEGATIVE Final    Comment: (NOTE) SARS-CoV-2 target nucleic acids are NOT DETECTED. The SARS-CoV-2 RNA is generally detectable in upper respiratoy specimens during the acute phase of infection. The lowest concentration of SARS-CoV-2 viral copies this assay can detect is 131 copies/mL. A negative result does not preclude SARS-Cov-2 infection and should not be used as the sole basis for treatment or other patient management decisions. A negative result may occur with  improper specimen collection/handling, submission of specimen other than nasopharyngeal swab, presence of viral mutation(s) within the areas targeted by this assay, and inadequate number of viral copies (<131 copies/mL). A negative result must be combined with clinical observations, patient history, and epidemiological information. The expected result is Negative. Fact Sheet for Patients:  https://www.moore.com/ Fact Sheet for Healthcare Providers:  https://www.young.biz/ This test is not yet ap proved or cleared by the [place] FDA and  has been authorized for detection and/or diagnosis of SARS-CoV-2 by FDA under an Emergency Use Authorization (EUA). This EUA will remain  in effect (meaning this test can be used) for the duration of the COVID-19 declaration under Section 564(b)(1) of the Act, 21 U.S.C. section 360bbb-3(b)(1), unless the authorization is terminated or revoked sooner.    Influenza A by PCR NEGATIVE NEGATIVE Final   Influenza B by PCR NEGATIVE  NEGATIVE Final    Comment: (NOTE) The Xpert Xpress SARS-CoV-2/FLU/RSV assay is intended as an aid in  the diagnosis of influenza from Nasopharyngeal swab specimens and  should not be used as a sole basis for treatment. Nasal washings and  aspirates are unacceptable for Xpert Xpress SARS-CoV-2/FLU/RSV  testing. Fact Sheet for Patients: https://www.moore.com/ Fact Sheet for Healthcare Providers: https://www.young.biz/ This test is not yet approved or cleared by the [place] FDA and  has been authorized for detection and/or diagnosis of SARS-CoV-2 by  FDA under an Emergency Use Authorization (EUA). This EUA will remain  in effect (meaning this test can be used) for the duration of the  Covid-19 declaration under Section 564(b)(1) of the Act, 21  U.S.C. section 360bbb-3(b)(1), unless the authorization is  terminated or revoked. Performed at [facility], [address], [place], [place] [address]     Radiology Reports DG Chest 2 View  Result Date: 01/26/2020 CLINICAL DATA:  Shortness of breath. Pt with recent TB treatment. EXAM: CHEST - 2 VIEW COMPARISON:  Chest radiograph 12/08/2019 FINDINGS: Stable cardiomediastinal contours. Aortic arch calcifications. Extensive bilateral left greater than right coarse interstitial opacities, similar to prior. No new focal consolidation. Persistent mild left pleural thickening or effusion. No pneumothorax. No acute finding in the visualized skeleton. IMPRESSION: 1. No acute cardiopulmonary process. 2. Extensive bilateral left greater than right coarse interstitial opacities, similar to prior, likely post infectious scarring. Persistent mild left pleural thickening or effusion. Electronically Signed   By: Emmaline Kluver M.D.   On: 01/26/2020 15:49   CT Angio Chest PE W and/or Wo Contrast  Result Date: 01/26/2020 CLINICAL DATA:  Short of breath and chest pain for 2 days EXAM: CT ANGIOGRAPHY  CHEST WITH CONTRAST TECHNIQUE: Multidetector CT imaging of the chest was performed using the standard protocol during bolus administration of intravenous contrast. Multiplanar CT image reconstructions and MIPs were  obtained to evaluate the vascular anatomy. CONTRAST:  165mL OMNIPAQUE IOHEXOL 350 MG/ML SOLN COMPARISON:  01/26/2020, 05/15/2019 FINDINGS: Cardiovascular: This is a technically adequate evaluation of the pulmonary vasculature. There are no acute filling defects or pulmonary emboli. The heart is mildly enlarged without pericardial effusion. Atherosclerosis of the aorta and coronary vasculature again noted unchanged. Normal caliber of the thoracic aorta. Mediastinum/Nodes: Stable subcentimeter mediastinal lymph nodes without pathologic adenopathy. Narrowing of the bilateral mainstem bronchi likely as a result of tracheal bronchomalacia. No bronchial wall thickening. The thyroid and esophagus are unremarkable. Lungs/Pleura: Bilateral areas of pleural and parenchymal scarring are again noted. A large cavity seen within the lung apices on prior study have decreased in size. Overall, the nodular areas of consolidation seen on prior study have all decreased in size and conspicuity since previous study, likely reflecting improved atypical infection such as fungal. Please correlate with any previous pulmonology workup. There are no new areas of consolidation. Trace bilateral pleural effusions are noted. No pneumothorax. There is background emphysema. Upper Abdomen: Calcified gallstones are identified. Left renal atrophy again noted. Musculoskeletal: No acute or destructive bony lesions. Reconstructed images demonstrate no additional findings. Review of the MIP images confirms the above findings. IMPRESSION: 1. Persistent but improved widespread bilateral nodular consolidation and cavitation. Overall, findings would favor improved atypical infection such as fungal pneumonia. Please correlate with any previous  pulmonology workup or sputum analysis. 2. No evidence of acute pulmonary embolus. 3. Trace bilateral pleural effusions. 4. Aortic Atherosclerosis (ICD10-I70.0) and Emphysema (ICD10-J43.9). Electronically Signed   By: Randa Ngo M.D.   On: 01/26/2020 18:23    Lab Data:  CBC: Recent Labs  Lab 01/26/20 1500 01/27/20 0412  WBC 6.7 6.3  HGB 12.8* 11.8*  HCT 41.5 38.5*  MCV 83.3 85.4  PLT 217 347   Basic Metabolic Panel: Recent Labs  Lab 01/26/20 1500  NA 142  K 3.9  CL 103  CO2 28  GLUCOSE 133*  BUN 27*  CREATININE 1.43*  CALCIUM 9.4   GFR: Estimated Creatinine Clearance: 43.1 mL/min (A) (by C-G formula based on SCr of 1.43 mg/dL (H)). Liver Function Tests: No results for input(s): AST, ALT, ALKPHOS, BILITOT, PROT, ALBUMIN in the last 168 hours. No results for input(s): LIPASE, AMYLASE in the last 168 hours. No results for input(s): AMMONIA in the last 168 hours. Coagulation Profile: Recent Labs  Lab 01/26/20 1525  INR 1.0   Cardiac Enzymes: No results for input(s): CKTOTAL, CKMB, CKMBINDEX, TROPONINI in the last 168 hours. BNP (last 3 results) No results for input(s): PROBNP in the last 8760 hours. HbA1C: Recent Labs    01/26/20 2053  HGBA1C 6.4*   CBG: Recent Labs  Lab 01/26/20 2127 01/27/20 0802 01/27/20 1256  GLUCAP 109* 71 102*   Lipid Profile: Recent Labs    01/26/20 2053  CHOL 148  HDL 38*  LDLCALC 101*  TRIG 45  CHOLHDL 3.9   Thyroid Function Tests: No results for input(s): TSH, T4TOTAL, FREET4, T3FREE, THYROIDAB in the last 72 hours. Anemia Panel: No results for input(s): VITAMINB12, FOLATE, FERRITIN, TIBC, IRON, RETICCTPCT in the last 72 hours. Urine analysis:    Component Value Date/Time   COLORURINE YELLOW 05/16/2019 0216   APPEARANCEUR CLEAR 05/16/2019 0216   LABSPEC 1.032 (H) 05/16/2019 0216   PHURINE 5.0 05/16/2019 0216   GLUCOSEU NEGATIVE 05/16/2019 0216   HGBUR MODERATE (A) 05/16/2019 0216   BILIRUBINUR NEGATIVE  05/16/2019 0216   KETONESUR NEGATIVE 05/16/2019 0216   PROTEINUR NEGATIVE 05/16/2019 0216  NITRITE NEGATIVE 05/16/2019 0216   LEUKOCYTESUR NEGATIVE 05/16/2019 0216     Tarvaris Puglia M.D. [facility] 01/27/2020, 1:40 PM   Call night coverage person covering after 7pm

## 2020-01-27 NOTE — Progress Notes (Addendum)
Pt CBG is 51 @ 2214. Hypogycemia standing orders placed and started for pt. Pt given an 8oz soda. Will recheck CBG 15 minutes post and update. Will continue to monitor pt.  CBG rechecked @ 2314 and is 143

## 2020-01-27 NOTE — Progress Notes (Signed)
At rest, pt's saturation levels are 95% or better.

## 2020-01-27 NOTE — Progress Notes (Signed)
  Echocardiogram 2D Echocardiogram has been performed.  Celene Skeen 01/27/2020, 9:26 AM

## 2020-01-27 NOTE — Progress Notes (Signed)
ANTICOAGULATION CONSULT NOTE - Follow Up Consult  Pharmacy Consult for Heparin Indication: chest pain/ACS  No Known Allergies  Patient Measurements: Height: 5\' 10"  (177.8 cm) Weight: 66.2 kg (145 lb 15.1 oz) IBW/kg (Calculated) : 73 Heparin Dosing Weight:   Vital Signs: Temp: 98.5 F (36.9 C) (04/23 2026) Temp Source: Oral (04/23 2026) BP: 160/90 (04/23 2026) Pulse Rate: 101 (04/23 2026)  Labs: Recent Labs    01/26/20 1500 01/26/20 1500 01/26/20 1525 01/26/20 1700 01/26/20 2053 01/26/20 2231 01/27/20 0412  HGB 12.8*  --   --   --   --   --  11.8*  HCT 41.5  --   --   --   --   --  38.5*  PLT 217  --   --   --   --   --  196  APTT  --   --  36  --   --   --   --   LABPROT  --   --  13.0  --   --   --   --   INR  --   --  1.0  --   --   --   --   HEPARINUNFRC  --   --   --   --   --   --  <0.10*  CREATININE 1.43*  --   --   --   --   --   --   TROPONINIHS 65*   < >  --  66* 58* 60*  --    < > = values in this interval not displayed.    Estimated Creatinine Clearance: 43.1 mL/min (A) (by C-G formula based on SCr of 1.43 mg/dL (H)).   Medications:  Infusions:  . sodium chloride 75 mL/hr at 01/26/20 2137  . heparin 800 Units/hr (01/26/20 2007)    Assessment: Patient with low heparin level.  No heparin issues per RN.  Goal of Therapy:  Heparin level 0.3-0.7 units/ml Monitor platelets by anticoagulation protocol: Yes   Plan:  Increase heparin to 1000 units/hr Recheck level at [address] [place] 01/27/2020,6:12 AM

## 2020-01-27 NOTE — Progress Notes (Signed)
SATURATION QUALIFICATIONS: (This note is used to comply with regulatory documentation for home oxygen)  Patient Saturations on Room Air at Rest = 93%  Patient Saturations on Room Air while Ambulating = 85%  Patient Saturations on 3 Liters of oxygen while Ambulating = 90%  Please briefly explain why patient needs home oxygen:  Pt able to increase saturations levels to 94% - 95% at rest on 3L

## 2020-01-28 ENCOUNTER — Other Ambulatory Visit: Payer: Self-pay | Admitting: Nurse Practitioner

## 2020-01-28 DIAGNOSIS — R071 Chest pain on breathing: Secondary | ICD-10-CM

## 2020-01-28 DIAGNOSIS — E7800 Pure hypercholesterolemia, unspecified: Secondary | ICD-10-CM

## 2020-01-28 DIAGNOSIS — N1832 Chronic kidney disease, stage 3b: Secondary | ICD-10-CM

## 2020-01-28 DIAGNOSIS — I429 Cardiomyopathy, unspecified: Secondary | ICD-10-CM

## 2020-01-28 LAB — BASIC METABOLIC PANEL
Anion gap: 12 (ref 5–15)
BUN: 29 mg/dL — ABNORMAL HIGH (ref 8–23)
CO2: 26 mmol/L (ref 22–32)
Calcium: 9.2 mg/dL (ref 8.9–10.3)
Chloride: 99 mmol/L (ref 98–111)
Creatinine, Ser: 1.58 mg/dL — ABNORMAL HIGH (ref 0.61–1.24)
GFR calc Af Amer: 50 mL/min — ABNORMAL LOW (ref >60)
GFR calc non Af Amer: 43 mL/min — ABNORMAL LOW (ref >60)
Glucose, Bld: 57 mg/dL — ABNORMAL LOW (ref 70–99)
Potassium: 3.6 mmol/L (ref 3.5–5.1)
Sodium: 137 mmol/L (ref 135–145)

## 2020-01-28 LAB — CBC
HCT: 42 % (ref 39.0–52.0)
Hemoglobin: 12.8 g/dL — ABNORMAL LOW (ref 13.0–17.0)
MCH: 25.8 pg — ABNORMAL LOW (ref 26.0–34.0)
MCHC: 30.5 g/dL (ref 30.0–36.0)
MCV: 84.5 fL (ref 80.0–100.0)
Platelets: 232 10*3/uL (ref 150–400)
RBC: 4.97 MIL/uL (ref 4.22–5.81)
RDW: 16.7 % — ABNORMAL HIGH (ref 11.5–15.5)
WBC: 5.7 10*3/uL (ref 4.0–10.5)
nRBC: 0 % (ref 0.0–0.2)

## 2020-01-28 LAB — GLUCOSE, CAPILLARY
Glucose-Capillary: 78 mg/dL (ref 70–99)
Glucose-Capillary: 124 mg/dL — ABNORMAL HIGH (ref 70–99)

## 2020-01-28 MED ORDER — METOPROLOL SUCCINATE ER 25 MG PO TB24
25.0000 mg | ORAL_TABLET | Freq: Every day | ORAL | 11 refills | Status: DC
Start: 2020-01-28 — End: 2020-02-12

## 2020-01-28 MED ORDER — FUROSEMIDE 40 MG PO TABS
40.0000 mg | ORAL_TABLET | Freq: Every day | ORAL | Status: DC
  Administered 2020-01-28: 40 mg via ORAL
  Filled 2020-01-28: qty 1

## 2020-01-28 MED ORDER — LISINOPRIL 20 MG PO TABS
20.0000 mg | ORAL_TABLET | Freq: Every day | ORAL | 5 refills | Status: DC
Start: 2020-01-30 — End: 2020-02-01

## 2020-01-28 MED ORDER — FUROSEMIDE 20 MG PO TABS: 20.0000 mg | ORAL_TABLET | Freq: Every day | ORAL | 2 refills | Status: DC

## 2020-01-28 NOTE — TOC Progression Note (Signed)
Transition of Care [facility]) - Progression Note    Patient Details  Name: BRAIDAN RICCIARDI MRN: [id number] Date of Birth: 12-Jul-1946  Transition of [facility] CM/SW Contact  Armanda Heritage, RN Phone Number: 01/28/2020, 1:33 PM  Clinical Narrative:   [facility] given referral for home oxygen.     Expected Discharge Plan: Home/Self Care Barriers to Discharge: No Barriers Identified  Expected Discharge Plan and Services Expected Discharge Plan: Home/Self Care   Discharge Planning Services: CM Consult Post Acute Care Choice: Durable Medical Equipment Living arrangements for the past 2 months: Mobile Home Expected Discharge Date: 01/28/20               DME Arranged: Oxygen DME Agency: [facility] Date DME Agency Contacted: 01/28/20 Time DME Agency Contacted: [phone] Representative spoke with at DME Agency: [place] HH Arranged: NA HH Agency: NA         Social Determinants of Health (SDOH) Interventions    Readmission Risk Interventions Readmission Risk Prevention Plan 07/25/2019 05/23/2019  Transportation Screening Complete Complete  PCP or Specialist Appt within 3-5 Days Complete Not Complete  Not Complete comments - Not ready for dc  HRI or Home Care Consult Complete Complete  Social Work Consult for Recovery Care Planning/Counseling Complete Not Complete  SW consult not completed comments - nA  Palliative Care Screening Not Applicable Not Applicable  Medication Review Oceanographer) Complete Complete  Some recent data might be hidden

## 2020-01-28 NOTE — Consult Note (Signed)
Cardiology Consultation:   Patient ID: Clarence Maldonado MRN: [id number]; DOB: 1946/07/15  Admit date: 01/26/2020 Date of Consult: 01/28/2020  Primary Care Provider: [name], DO Primary Cardiologist: No primary care provider on file. none Primary Electrophysiologist:  None    Patient Profile:   Clarence Maldonado is a 74 y.o. male with a hx of pulmonary TB who is being seen today for the evaluation of new LV dysfunction at the request of Dr. Isidoro Donning.  History of Present Illness:   Clarence Maldonado has a h/o COPD and stopped smoking 2 weeks ago. He was admitted with respiratory failure with desaturation. Covid negative. CT of chest negative for PE. He had very atypical chest pain. Troponins were negative. His 2D echo shows a new finding of LV dysfunction with an EF of 35%. He is referred for additional evaluation. He has hypoxemia at baseline and will need oxygen therapy at discharge. His echo showed global hypokinesis with no segmental wall motion abnormalities.    Past Medical History:  Diagnosis Date  . Abnormal CT of the chest 2009   nodule 9 x 6 mm RUL  . Benign localized hyperplasia of prostate with urinary obstruction and other lower urinary tract symptoms (LUTS)(600.21)   . Cardiomegaly   . Cerebrovascular accident (stroke) (HCC) 2009   lacunar infarct in the left thalamus  . Cholelithiasis   . Chronic airway obstruction, not elsewhere classified   . Chronic kidney disease, stage III (moderate)   . Closed femur fracture (HCC) 1960  . Illiterate   . Obesity, unspecified   . Other and unspecified hyperlipidemia   . Tobacco use disorder   . Type II or unspecified type diabetes mellitus with renal manifestations, uncontrolled(250.42)   . Unspecified essential hypertension     Past Surgical History:  Procedure Laterality Date  . FEMUR FRACTURE SURGERY Left 1960     Home Medications:  Prior to Admission medications   Medication Sig Start Date End Date Taking? Authorizing  Provider  albuterol (VENTOLIN HFA) 108 (90 Base) MCG/ACT inhaler INHALE 1 TO 2 PUFFS BY MOUTH EVERY 4 TO 6 HOURS AS NEEDED FOR BREATHING Patient taking differently: Inhale 2 puffs into the lungs every 6 (six) hours as needed for wheezing or shortness of breath.  12/05/19  Yes Reed, Tiffany L, DO  aspirin EC 81 MG tablet Take 81 mg by mouth 2 (two) times daily.   Yes [provider]  buPROPion (WELLBUTRIN XL) 150 MG 24 hr tablet TAKE 1 TABLET(150 MG) BY MOUTH DAILY 08/16/19  Yes Reed, Tiffany L, DO  ferrous sulfate 325 (65 FE) MG tablet Take 1 tablet (325 mg total) by mouth 2 (two) times daily with a meal. 07/25/19  Yes Narda Bonds, MD  Fluticasone-Salmeterol (ADVAIR DISKUS) 250-50 MCG/DOSE AEPB Inhale 1 puff into the lungs 2 (two) times daily. 09/19/19  Yes Reed, Tiffany L, DO  glimepiride (AMARYL) 1 MG tablet TAKE 1 TABLET(1 MG) BY MOUTH DAILY WITH BREAKFAST Patient taking differently: Take 1 mg by mouth daily with breakfast.  04/12/19  Yes Reed, Tiffany L, DO  isoniazid (NYDRAZID) 300 MG tablet Take 1 tablet (300 mg total) by mouth daily. 07/26/19  Yes Narda Bonds, MD  lisinopril-hydrochlorothiazide (ZESTORETIC) 20-25 MG tablet Take 1 tablet by mouth daily. 11/17/19  Yes [provider]  lovastatin (MEVACOR) 20 MG tablet TAKE 1 TABLET(20 MG) BY MOUTH DAILY AT 6 PM Patient taking differently: Take 20 mg by mouth daily at 6 PM.  04/12/19  Yes  Reed, Tiffany L, DO  pyridOXINE (B-6) 50 MG tablet Take 1 tablet (50 mg total) by mouth daily. 07/26/19  Yes Mariel Aloe, MD  rifampin (RIFADIN) 300 MG capsule Take 2 capsules (600 mg total) by mouth daily. 07/26/19  Yes Mariel Aloe, MD  hydrocerin (EUCERIN) CREA Apply 1 application topically 2 (two) times daily. Patient not taking: Reported on 01/26/2020 07/25/19   Mariel Aloe, MD    Inpatient Medications: Scheduled Meds: . aspirin EC  81 mg Oral BID  . buPROPion  150 mg Oral Daily  . ferrous sulfate  325 mg Oral BID WC  .  furosemide  40 mg Oral Daily  . glimepiride  1 mg Oral Q breakfast  . heparin injection (subcutaneous)  5,000 Units Subcutaneous Q8H  . insulin aspart  0-5 Units Subcutaneous QHS  . insulin aspart  0-9 Units Subcutaneous TID WC  . isoniazid  300 mg Oral Daily  . mometasone-formoterol  2 puff Inhalation BID  . nicotine  21 mg Transdermal Daily  . pravastatin  20 mg Oral q1800  . pyridOXINE  50 mg Oral Daily  . rifampin  600 mg Oral Daily  . sodium chloride flush  3 mL Intravenous Once   Continuous Infusions:  PRN Meds: acetaminophen, albuterol, ondansetron (ZOFRAN) IV, zolpidem  Allergies:   No Known Allergies  Social History:   Social History   Socioeconomic History  . Marital status: Single    Spouse name: Not on file  . Number of children: Not on file  . Years of education: Not on file  . Highest education level: Not on file  Occupational History  . Not on file  Tobacco Use  . Smoking status: Former Smoker    Packs/day: 0.50    Years: 60.00    Pack years: 30.00    Types: Cigarettes  . Smokeless tobacco: Never Used  . Tobacco comment: quit smoking 3 month days   Substance and Sexual Activity  . Alcohol use: Yes    Alcohol/week: 1.0 standard drinks    Types: 1 Cans of beer per week  . Drug use: No  . Sexual activity: Not on file  Other Topics Concern  . Not on file  Social History Narrative  . Not on file   Social Determinants of Health   Financial Resource Strain:   . Difficulty of Paying Living Expenses:   Food Insecurity:   . Worried About Charity fundraiser in the Last Year:   . Arboriculturist in the Last Year:   Transportation Needs:   . Film/video editor (Medical):   Marland Kitchen Lack of Transportation (Non-Medical):   Physical Activity:   . Days of Exercise per Week:   . Minutes of Exercise per Session:   Stress:   . Feeling of Stress :   Social Connections:   . Frequency of Communication with Friends and Family:   . Frequency of Social Gatherings  with Friends and Family:   . Attends Religious Services:   . Active Member of Clubs or Organizations:   . Attends Archivist Meetings:   Marland Kitchen Marital Status:   Intimate Partner Violence:   . Fear of Current or Ex-Partner:   . Emotionally Abused:   Marland Kitchen Physically Abused:   . Sexually Abused:     Family History:    Family History  Problem Relation Age of Onset  . Heart disease Mother      ROS:  Please see the history  of present illness.   All other ROS reviewed and negative.     Physical Exam/Data:   Vitals:   01/27/20 1053 01/27/20 1242 01/27/20 2218 01/28/20 0602  BP:  (!) 146/92 139/84 127/70  Pulse:  (!) 102 81 75  Resp:  16 18 16   Temp:  97.8 F (36.6 C) 98.4 F (36.9 C) 98.4 F (36.9 C)  TempSrc:  Oral Oral Oral  SpO2: 95% 98% 95% 91%  Weight:      Height:        Intake/Output Summary (Last 24 hours) at 01/28/2020 0913 Last data filed at 01/27/2020 2223 Gross per 24 hour  Intake 511.5 ml  Output 1700 ml  Net -1188.5 ml   Last 3 Weights 01/26/2020 01/26/2020 07/25/2019  Weight (lbs) 145 lb 15.1 oz 150 lb 128 lb 11.2 oz  Weight (kg) 66.2 kg 68.04 kg 58.378 kg     Body mass index is 20.94 kg/m.  General:  Chronically ill appearing but no acute distress HEENT: normal Lymph: no adenopathy Neck: 6 cm JVD Endocrine:  No thryomegaly Vascular: No carotid bruits; FA pulses 2+ bilaterally without bruits  Cardiac:  normal S1, S2; RRR; 1/6 systolic murmur Lungs:  clear to auscultation bilaterally, no wheezing, rhonchi or rales  Abd: soft, nontender, no hepatomegaly  Ext: no edema Musculoskeletal:  No deformities, BUE and BLE strength normal and equal Skin: warm and dry  Neuro:  CNs 2-12 intact, no focal abnormalities noted Psych:  Normal affect   EKG:  The EKG was personally reviewed and demonstrates:  Sinus tachycardia Telemetry:  Telemetry was personally reviewed and demonstrates:  nsr  Relevant CV Studies: 2D echo reviewed  Laboratory Data:  High  Sensitivity Troponin:   Recent Labs  Lab 01/26/20 1500 01/26/20 1700 01/26/20 2053 01/26/20 2231  TROPONINIHS 65* 66* 58* 60*     Chemistry Recent Labs  Lab 01/26/20 1500 01/28/20 0505  NA 142 137  K 3.9 3.6  CL 103 99  CO2 28 26  GLUCOSE 133* 57*  BUN 27* 29*  CREATININE 1.43* 1.58*  CALCIUM 9.4 9.2  GFRNONAA 48* 43*  GFRAA 56* 50*  ANIONGAP 11 12    No results for input(s): PROT, ALBUMIN, AST, ALT, ALKPHOS, BILITOT in the last 168 hours. Hematology Recent Labs  Lab 01/26/20 1500 01/27/20 0412 01/28/20 0505  WBC 6.7 6.3 5.7  RBC 4.98 4.51 4.97  HGB 12.8* 11.8* 12.8*  HCT 41.5 38.5* 42.0  MCV 83.3 85.4 84.5  MCH 25.7* 26.2 25.8*  MCHC 30.8 30.6 30.5  RDW 16.7* 17.0* 16.7*  PLT 217 196 232   BNP Recent Labs  Lab 01/26/20 1500  BNP 1,683.7*    DDimer  Recent Labs  Lab 01/26/20 1525  DDIMER 1.63*     Radiology/Studies:  DG Chest 2 View  Result Date: 01/26/2020 CLINICAL DATA:  Shortness of breath. Pt with recent TB treatment. EXAM: CHEST - 2 VIEW COMPARISON:  Chest radiograph 12/08/2019 FINDINGS: Stable cardiomediastinal contours. Aortic arch calcifications. Extensive bilateral left greater than right coarse interstitial opacities, similar to prior. No new focal consolidation. Persistent mild left pleural thickening or effusion. No pneumothorax. No acute finding in the visualized skeleton. IMPRESSION: 1. No acute cardiopulmonary process. 2. Extensive bilateral left greater than right coarse interstitial opacities, similar to prior, likely post infectious scarring. Persistent mild left pleural thickening or effusion. Electronically Signed   By: Emmaline Kluver M.D.   On: 01/26/2020 15:49   CT Angio Chest PE W and/or Wo Contrast  Result Date: 01/26/2020 CLINICAL DATA:  Short of breath and chest pain for 2 days EXAM: CT ANGIOGRAPHY CHEST WITH CONTRAST TECHNIQUE: Multidetector CT imaging of the chest was performed using the standard protocol during bolus  administration of intravenous contrast. Multiplanar CT image reconstructions and MIPs were obtained to evaluate the vascular anatomy. CONTRAST:  OMNIPAQUE IOHEXOL 350 MG/ML SOLN COMPARISON:  01/26/2020, 05/15/2019 FINDINGS: Cardiovascular: This is a technically adequate evaluation of the pulmonary vasculature. There are no acute filling defects or pulmonary emboli. The heart is mildly enlarged without pericardial effusion. Atherosclerosis of the aorta and coronary vasculature again noted unchanged. Normal caliber of the thoracic aorta. Mediastinum/Nodes: Stable subcentimeter mediastinal lymph nodes without pathologic adenopathy. Narrowing of the bilateral mainstem bronchi likely as a result of tracheal bronchomalacia. No bronchial wall thickening. The thyroid and esophagus are unremarkable. Lungs/Pleura: Bilateral areas of pleural and parenchymal scarring are again noted. A large cavity seen within the lung apices on prior study have decreased in size. Overall, the nodular areas of consolidation seen on prior study have all decreased in size and conspicuity since previous study, likely reflecting improved atypical infection such as fungal. Please correlate with any previous pulmonology workup. There are no new areas of consolidation. Trace bilateral pleural effusions are noted. No pneumothorax. There is background emphysema. Upper Abdomen: Calcified gallstones are identified. Left renal atrophy again noted. Musculoskeletal: No acute or destructive bony lesions. Reconstructed images demonstrate no additional findings. Review of the MIP images confirms the above findings. IMPRESSION: 1. Persistent but improved widespread bilateral nodular consolidation and cavitation. Overall, findings would favor improved atypical infection such as fungal pneumonia. Please correlate with any previous pulmonology workup or sputum analysis. 2. No evidence of acute pulmonary embolus. 3. Trace bilateral pleural effusions. 4. Aortic  Atherosclerosis (ICD10-I70.0) and Emphysema (ICD10-J43.9). Electronically Signed   By: Sharlet Salina M.D.   On: 01/26/2020 18:23   ECHOCARDIOGRAM COMPLETE  Result Date: 01/27/2020    ECHOCARDIOGRAM REPORT   Patient Name:   Clarence Maldonado Date of Exam: 01/27/2020 Medical Rec #:  [id number]        Height:       70.0 in Accession #:    [id number]       Weight:       145.9 lb Date of Birth:  July 04, 1946        BSA:          1.826 m Patient Age:    74 years         BP:           159/92 mmHg Patient Gender: M                HR:           97 bpm. Exam Location:  Inpatient Procedure: 2D Echo Indications:    786.50 chest pain  History:        Patient has prior history of Echocardiogram examinations, most                 recent 05/16/2019. Cardiomegaly; Risk Factors:Hypertension,                 Diabetes, Dyslipidemia and Former Smoker. Tobacco abuse. CVA.  Sonographer:    Celene Skeen RDCS (AE) Referring Phys: [id number] [facility] Jerelyn Charles  Sonographer Comments: heavy breathing during exam made imaging challenging at times IMPRESSIONS  1. Left ventricular ejection fraction, by estimation, is 30 to 35%. The left ventricle has moderately decreased function. The left ventricle demonstrates  global hypokinesis. There is mild left ventricular hypertrophy. Left ventricular diastolic parameters are indeterminate.  2. Right ventricular systolic function is normal. The right ventricular size is normal.  3. The mitral valve is degenerative. Mild mitral valve regurgitation. No evidence of mitral stenosis.  4. The aortic valve is tricuspid. Aortic valve regurgitation is mild. Mild to moderate aortic valve sclerosis/calcification is present, without any evidence of aortic stenosis.  5. The inferior vena cava is normal in size with greater than 50% respiratory variability, suggesting right atrial pressure of 3 mmHg. Comparison(s): Prior images reviewed side by side. The left ventricular function is significantly worse. FINDINGS  Left  Ventricle: Left ventricular ejection fraction, by estimation, is 30 to 35%. The left ventricle has moderately decreased function. The left ventricle demonstrates global hypokinesis. The left ventricular internal cavity size was normal in size. There is mild left ventricular hypertrophy. Left ventricular diastolic parameters are indeterminate. Right Ventricle: The right ventricular size is normal. No increase in right ventricular wall thickness. Right ventricular systolic function is normal. Left Atrium: Left atrial size was normal in size. Right Atrium: Right atrial size was normal in size. Pericardium: There is no evidence of pericardial effusion. Mitral Valve: The mitral valve is degenerative in appearance. There is mild thickening of the mitral valve leaflet(s). There is mild calcification of the mitral valve leaflet(s). Normal mobility of the mitral valve leaflets. Severe mitral annular calcification. Mild mitral valve regurgitation. No evidence of mitral valve stenosis. Tricuspid Valve: The tricuspid valve is normal in structure. Tricuspid valve regurgitation is not demonstrated. No evidence of tricuspid stenosis. Aortic Valve: The aortic valve is tricuspid. Aortic valve regurgitation is mild. Mild to moderate aortic valve sclerosis/calcification is present, without any evidence of aortic stenosis. Pulmonic Valve: The pulmonic valve was normal in structure. Pulmonic valve regurgitation is not visualized. No evidence of pulmonic stenosis. Aorta: The aortic root is normal in size and structure. Venous: The inferior vena cava is normal in size with greater than 50% respiratory variability, suggesting right atrial pressure of 3 mmHg. IAS/Shunts: No atrial level shunt detected by color flow Doppler.  LEFT VENTRICLE PLAX 2D LVIDd:         5.00 cm  Diastology LVIDs:         4.40 cm  LV e' lateral:   5.33 cm/s LV PW:         1.30 cm  LV E/e' lateral: 28.3 LV IVS:        1.10 cm LVOT diam:     2.35 cm LV SV:         72  LV SV Index:   39 LVOT Area:     4.34 cm  LEFT ATRIUM           Index LA diam:      3.80 cm 2.08 cm/m LA Vol (A4C): 39.5 ml 21.64 ml/m  AORTIC VALVE LVOT Vmax:   90.50 cm/s LVOT Vmean:  54.400 cm/s LVOT VTI:    0.165 m  AORTA Ao Root diam: 3.60 cm MITRAL VALVE MV Area (PHT): 3.30 cm     SHUNTS MV Decel Time: 230 msec     Systemic VTI:  0.16 m MV E velocity: 151.00 cm/s  Systemic Diam: 2.35 cm Donato Schultz MD Electronically signed by Donato Schultz MD Signature Date/Time: 01/27/2020/2:13:08 PM    Final        Assessment and Plan:   1. New LV dysfunction - he will need to start on toprol 25 mg daily in  addition to his ACE. Ok to DC home when you think he is ready. He can have an outpatient stress test. No invasive workup. 2. Tobacco abuse - he is encouraged to remain off of cigarettes.  3. COPD - I suspect he will need home oxygen.  4. Pulmonary TB - on therapy.   [facility] will sign off.   Medication Recommendations:  See above Other recommendations (labs, testing, etc):  None. We will arrange an outpatient stress test Follow up as an outpatient:  With cardiogogy APP.  For questions or updates, please contact [facility] Please consult [email or web address] for contact info under     Signed, Lewayne Bunting, MD  01/28/2020 9:13 AM

## 2020-01-28 NOTE — Discharge Summary (Signed)
Physician Discharge Summary   Patient ID: Clarence Maldonado MRN: [id number] DOB/AGE: [birth year] 74 y.o.  Admit date: 01/26/2020 Discharge date: 01/28/2020  Primary Care Physician:  Clarence Balo, DO   Recommendations for Outpatient Follow-up:  1. Follow up with PCP in 1-2 weeks 2. Patient started on Lasix 20 mg daily, Toprol-XL 25 mg daily, lisinopril 20 mg daily 3. Combination pill of lisinopril, HCTZ discontinued 4. Cardiology will arrange outpatient stress test  Home Health: None  Equipment/Devices: DME home O2   Discharge Condition: stable  CODE STATUS: FULL  Diet recommendation: Heart healthy diet   Patient Saturations on Room Air at Rest = 93%  Patient Saturations on Room Air while Ambulating = 85%  Patient Saturations on 3 Liters of oxygen while Ambulating = 90%  Please briefly explain why patient needs home oxygen:  Pt able to increase saturations levels to 94% - 95% at rest on 3L   Discharge Diagnoses:    Acute respiratory failure with hypoxia likely secondary to new LV dysfunction, systolic CHF . atypical Chest pain with new LV dysfunction . Essential hypertension . CKD (chronic kidney disease) stage 3, GFR 30-59 ml/min (HCC) . Hyperlipidemia . Tobacco use disorder . Controlled type 2 diabetes mellitus with stage 3 chronic kidney disease, without long-term current use of insulin (HCC) . Pulmonary tuberculosis with cavitation . Moderate protein-calorie malnutrition (HCC) Mild dementia  Consults: Cardiology, Dr. Ladona Maldonado    Allergies:  No Known Allergies   DISCHARGE MEDICATIONS: Allergies as of 01/28/2020   No Known Allergies     Medication List    STOP taking these medications   hydrocerin Crea   lisinopril-hydrochlorothiazide 20-25 MG tablet Commonly known as: ZESTORETIC     TAKE these medications   albuterol 108 (90 Base) MCG/ACT inhaler Commonly known as: VENTOLIN HFA INHALE 1 TO 2 PUFFS BY MOUTH EVERY 4 TO 6 HOURS AS NEEDED FOR  BREATHING What changed: See the new instructions.   aspirin EC 81 MG tablet Take 81 mg by mouth 2 (two) times daily.   buPROPion 150 MG 24 hr tablet Commonly known as: WELLBUTRIN XL TAKE 1 TABLET(150 MG) BY MOUTH DAILY   ferrous sulfate 325 (65 FE) MG tablet Take 1 tablet (325 mg total) by mouth 2 (two) times daily with a meal.   Fluticasone-Salmeterol 250-50 MCG/DOSE Aepb Commonly known as: Advair Diskus Inhale 1 puff into the lungs 2 (two) times daily.   furosemide 20 MG tablet Commonly known as: LASIX Take 1 tablet (20 mg total) by mouth daily. Start taking on: January 30, 2020   glimepiride 1 MG tablet Commonly known as: AMARYL TAKE 1 TABLET(1 MG) BY MOUTH DAILY WITH BREAKFAST What changed: See the new instructions.   isoniazid 300 MG tablet Commonly known as: NYDRAZID Take 1 tablet (300 mg total) by mouth daily.   lisinopril 20 MG tablet Commonly known as: ZESTRIL Take 1 tablet (20 mg total) by mouth daily. Start taking on: January 30, 2020   lovastatin 20 MG tablet Commonly known as: MEVACOR TAKE 1 TABLET(20 MG) BY MOUTH DAILY AT 6 PM What changed: See the new instructions.   metoprolol succinate 25 MG 24 hr tablet Commonly known as: Toprol XL Take 1 tablet (25 mg total) by mouth daily.   pyridOXINE 50 MG tablet Commonly known as: B-6 Take 1 tablet (50 mg total) by mouth daily.   rifampin 300 MG capsule Commonly known as: RIFADIN Take 2 capsules (600 mg total) by mouth daily.  Durable Medical Equipment  (From admission, onward)         Start     Ordered   01/27/20 1336  For home use only DME oxygen  Once    Question Answer Comment  Length of Need 12 Months   Mode or (Route) Nasal cannula   Liters per Minute 3   Frequency Continuous (stationary and portable oxygen unit needed)   Oxygen conserving device Yes   Oxygen delivery system Gas      01/27/20 1336           Brief H and P: For complete details please refer to admission  H and P, but in brief Clarence Maldonado a 74 y.o.malewith medical history significant ofCOPD, tobacco abuse, morbid obesity, diabetes, hypertension, history of CVA, BPH, pulmonary tuberculosis currently on treatment presented to ED with substernal chest pain.  Patient also reported shortness of breath, going on for 1 to 2 weeks, no fevers or chills.  No diaphoresis, no PND or orthopnea.  CT angiogram of the chest was negative for PE Patient was admitted for acute hypoxic respiratory failure, chest pain rule out COVID-19 test negative  Hospital Course:  Atypical chest pain, acute respiratory failure with hypoxia likely secondary to new LV dysfunction, systolic CHF Underlying history of pulmonary TB with cavitation -CT angiogram of the chest was negative for PE, persistent but improved widespread bilateral nodular consolidation and cavitation (known history of TB) -Troponins flat 60-58-66, 2D echo pending, currently no chest pain.  DC IV heparin -Elevated BNP, short of breath during exam bilateral coarse breath sounds.  Elevated BNP 1683.7 -Patient was placed on IV Lasix 40 mg, transition to oral Lasix 20 mg daily, -2D echo showed EF 30-35%, global hypokinesis. Prior echo in 05/2019 with normal EF.  -consulted cardiology, recommended lisinopril 20 mg daily, Toprol-XL 25 mg daily. Discontinued HCTZ -Home O2 evaluation was done, patient qualifies for 3 L O2    Pulmonary tuberculosis with cavitation -CT chest reviewed, currently on DOT through health department, continue INH, rifampin, pyridoxine    Essential hypertension -BP currently stable Started on Lasix, Toprol-XL Continue lisinopril. Discontinued combination pill of lisinopril/HCTZ  Mild acute on CKD (chronic kidney disease) stage 3b, GFR 30-59 ml/min (HCC) -Baseline creatinine 1.2-1.3 -Creatinine trended up to 1.5, secondary to diuresis, lisinopril, HCTZ    Hyperlipidemia -Continue pravastatin    Tobacco use  disorder -Continue nicotine patch    Controlled type 2 diabetes mellitus with stage 3 chronic kidney disease, without long-term current use of insulin (HCC) -Continue sliding scale insulin     Moderate protein-calorie malnutrition (HCC) Estimated body mass index is 20.94 kg/m as calculated from the following:   Height as of this encounter: 5\' 10"  (1.778 m).   Weight as of this encounter: 66.2 kg.   Failure to thrive, dementia -Patient appears to have some underlying mild dementia, states his friend is helping with his care, otherwise lives alone.    Day of Discharge S: States he is getting 'burn out', wants to go home and rest up in his own bed.  No fevers or chills, no worsening shortness of breath.  No chest pain  BP 97/86 (BP Location: Right Arm)   Pulse 76   Temp 97.6 F (36.4 C) (Oral)   Resp 16   Ht 5\' 10"  (1.778 m)   Wt 66.2 kg   SpO2 96%   BMI 20.94 kg/m   Physical Exam: General: Alert and awake oriented x3 not in any acute distress. HEENT:  anicteric sclera, pupils reactive to light and accommodation CVS: S1-S2 clear no murmur rubs or gallops Chest: Decreased breath sound at the bases Abdomen: soft nontender, nondistended, normal bowel sounds Extremities: no cyanosis, clubbing or edema noted bilaterally Neuro: Cranial nerves II-XII intact, no focal neurological deficits    Get Medicines reviewed and adjusted: Please take all your medications with you for your next visit with your Primary MD  Please request your Primary MD to go over all hospital tests and procedure/radiological results at the follow up. Please ask your Primary MD to get all Hospital records sent to his/her office.  If you experience worsening of your admission symptoms, develop shortness of breath, life threatening emergency, suicidal or homicidal thoughts you must seek medical attention immediately by calling 911 or calling your MD immediately  if symptoms less severe.  You must read  complete instructions/literature along with all the possible adverse reactions/side effects for all the Medicines you take and that have been prescribed to you. Take any new Medicines after you have completely understood and accept all the possible adverse reactions/side effects.   Do not drive when taking pain medications.   Do not take more than prescribed Pain, Sleep and Anxiety Medications  Special Instructions: If you have smoked or chewed Tobacco  in the last 2 yrs please stop smoking, stop any regular Alcohol  and or any Recreational drug use.  Wear Seat belts while driving.  Please note  You were cared for by a hospitalist during your hospital stay. Once you are discharged, your primary care physician will handle any further medical issues. Please note that NO REFILLS for any discharge medications will be authorized once you are discharged, as it is imperative that you return to your primary care physician (or establish a relationship with a primary care physician if you do not have one) for your aftercare needs so that they can reassess your need for medications and monitor your lab values.   The results of significant diagnostics from this hospitalization (including imaging, microbiology, ancillary and laboratory) are listed below for reference.      Procedures/Studies:  DG Chest 2 View  Result Date: 01/26/2020 CLINICAL DATA:  Shortness of breath. Pt with recent TB treatment. EXAM: CHEST - 2 VIEW COMPARISON:  Chest radiograph 12/08/2019 FINDINGS: Stable cardiomediastinal contours. Aortic arch calcifications. Extensive bilateral left greater than right coarse interstitial opacities, similar to prior. No new focal consolidation. Persistent mild left pleural thickening or effusion. No pneumothorax. No acute finding in the visualized skeleton. IMPRESSION: 1. No acute cardiopulmonary process. 2. Extensive bilateral left greater than right coarse interstitial opacities, similar to prior,  likely post infectious scarring. Persistent mild left pleural thickening or effusion. Electronically Signed   By: Emmaline Kluver M.D.   On: 01/26/2020 15:49   CT Angio Chest PE W and/or Wo Contrast  Result Date: 01/26/2020 CLINICAL DATA:  Short of breath and chest pain for 2 days EXAM: CT ANGIOGRAPHY CHEST WITH CONTRAST TECHNIQUE: Multidetector CT imaging of the chest was performed using the standard protocol during bolus administration of intravenous contrast. Multiplanar CT image reconstructions and MIPs were obtained to evaluate the vascular anatomy. CONTRAST:  OMNIPAQUE IOHEXOL 350 MG/ML SOLN COMPARISON:  01/26/2020, 05/15/2019 FINDINGS: Cardiovascular: This is a technically adequate evaluation of the pulmonary vasculature. There are no acute filling defects or pulmonary emboli. The heart is mildly enlarged without pericardial effusion. Atherosclerosis of the aorta and coronary vasculature again noted unchanged. Normal caliber of the thoracic aorta. Mediastinum/Nodes: Stable subcentimeter  mediastinal lymph nodes without pathologic adenopathy. Narrowing of the bilateral mainstem bronchi likely as a result of tracheal bronchomalacia. No bronchial wall thickening. The thyroid and esophagus are unremarkable. Lungs/Pleura: Bilateral areas of pleural and parenchymal scarring are again noted. A large cavity seen within the lung apices on prior study have decreased in size. Overall, the nodular areas of consolidation seen on prior study have all decreased in size and conspicuity since previous study, likely reflecting improved atypical infection such as fungal. Please correlate with any previous pulmonology workup. There are no new areas of consolidation. Trace bilateral pleural effusions are noted. No pneumothorax. There is background emphysema. Upper Abdomen: Calcified gallstones are identified. Left renal atrophy again noted. Musculoskeletal: No acute or destructive bony lesions. Reconstructed images  demonstrate no additional findings. Review of the MIP images confirms the above findings. IMPRESSION: 1. Persistent but improved widespread bilateral nodular consolidation and cavitation. Overall, findings would favor improved atypical infection such as fungal pneumonia. Please correlate with any previous pulmonology workup or sputum analysis. 2. No evidence of acute pulmonary embolus. 3. Trace bilateral pleural effusions. 4. Aortic Atherosclerosis (ICD10-I70.0) and Emphysema (ICD10-J43.9). Electronically Signed   By: Randa Ngo M.D.   On: 01/26/2020 18:23   ECHOCARDIOGRAM COMPLETE  Result Date: 01/27/2020    ECHOCARDIOGRAM REPORT   Patient Name:   RUDOLF BLIZARD Date of Exam: 01/27/2020 Medical Rec #:  [id number]        Height:       70.0 in Accession #:    [id number]       Weight:       145.9 lb Date of Birth:  [birth year]        BSA:          1.826 m Patient Age:    73 years         BP:           159/92 mmHg Patient Gender: M                HR:           97 bpm. Exam Location:  Inpatient Procedure: 2D Echo Indications:    786.50 chest pain  History:        Patient has prior history of Echocardiogram examinations, most                 recent 05/16/2019. Cardiomegaly; Risk Factors:Hypertension,                 Diabetes, Dyslipidemia and Former Smoker. Tobacco abuse. CVA.  Sonographer:    Jannett Celestine RDCS (AE) Referring Phys: [id number] [facility] Julious Oka  Sonographer Comments: heavy breathing during exam made imaging challenging at times IMPRESSIONS  1. Left ventricular ejection fraction, by estimation, is 30 to 35%. The left ventricle has moderately decreased function. The left ventricle demonstrates global hypokinesis. There is mild left ventricular hypertrophy. Left ventricular diastolic parameters are indeterminate.  2. Right ventricular systolic function is normal. The right ventricular size is normal.  3. The mitral valve is degenerative. Mild mitral valve regurgitation. No evidence of mitral stenosis.  4.  The aortic valve is tricuspid. Aortic valve regurgitation is mild. Mild to moderate aortic valve sclerosis/calcification is present, without any evidence of aortic stenosis.  5. The inferior vena cava is normal in size with greater than 50% respiratory variability, suggesting right atrial pressure of 3 mmHg. Comparison(s): Prior images reviewed side by side. The left ventricular function is significantly worse. FINDINGS  Left Ventricle: Left ventricular  ejection fraction, by estimation, is 30 to 35%. The left ventricle has moderately decreased function. The left ventricle demonstrates global hypokinesis. The left ventricular internal cavity size was normal in size. There is mild left ventricular hypertrophy. Left ventricular diastolic parameters are indeterminate. Right Ventricle: The right ventricular size is normal. No increase in right ventricular wall thickness. Right ventricular systolic function is normal. Left Atrium: Left atrial size was normal in size. Right Atrium: Right atrial size was normal in size. Pericardium: There is no evidence of pericardial effusion. Mitral Valve: The mitral valve is degenerative in appearance. There is mild thickening of the mitral valve leaflet(s). There is mild calcification of the mitral valve leaflet(s). Normal mobility of the mitral valve leaflets. Severe mitral annular calcification. Mild mitral valve regurgitation. No evidence of mitral valve stenosis. Tricuspid Valve: The tricuspid valve is normal in structure. Tricuspid valve regurgitation is not demonstrated. No evidence of tricuspid stenosis. Aortic Valve: The aortic valve is tricuspid. Aortic valve regurgitation is mild. Mild to moderate aortic valve sclerosis/calcification is present, without any evidence of aortic stenosis. Pulmonic Valve: The pulmonic valve was normal in structure. Pulmonic valve regurgitation is not visualized. No evidence of pulmonic stenosis. Aorta: The aortic root is normal in size and  structure. Venous: The inferior vena cava is normal in size with greater than 50% respiratory variability, suggesting right atrial pressure of 3 mmHg. IAS/Shunts: No atrial level shunt detected by color flow Doppler.  LEFT VENTRICLE PLAX 2D LVIDd:         5.00 cm  Diastology LVIDs:         4.40 cm  LV e' lateral:   5.33 cm/s LV PW:         1.30 cm  LV E/e' lateral: 28.3 LV IVS:        1.10 cm LVOT diam:     2.35 cm LV SV:         72 LV SV Index:   39 LVOT Area:     4.34 cm  LEFT ATRIUM           Index LA diam:      3.80 cm 2.08 cm/m LA Vol (A4C): 39.5 ml 21.64 ml/m  AORTIC VALVE LVOT Vmax:   90.50 cm/s LVOT Vmean:  54.400 cm/s LVOT VTI:    0.165 m  AORTA Ao Root diam: 3.60 cm MITRAL VALVE MV Area (PHT): 3.30 cm     SHUNTS MV Decel Time: 230 msec     Systemic VTI:  0.16 m MV E velocity: 151.00 cm/s  Systemic Diam: 2.35 cm Donato Schultz MD Electronically signed by Donato Schultz MD Signature Date/Time: 01/27/2020/2:13:08 PM    Final        LAB RESULTS: Basic Metabolic Panel: Recent Labs  Lab 01/26/20 1500 01/28/20 0505  NA 142 137  K 3.9 3.6  CL 103 99  CO2 28 26  GLUCOSE 133* 57*  BUN 27* 29*  CREATININE 1.43* 1.58*  CALCIUM 9.4 9.2   Liver Function Tests: No results for input(s): AST, ALT, ALKPHOS, BILITOT, PROT, ALBUMIN in the last 168 hours. No results for input(s): LIPASE, AMYLASE in the last 168 hours. No results for input(s): AMMONIA in the last 168 hours. CBC: Recent Labs  Lab 01/27/20 0412 01/27/20 0412 01/28/20 0505  WBC 6.3  --  5.7  HGB 11.8*  --  12.8*  HCT 38.5*  --  42.0  MCV 85.4   < > 84.5  PLT 196  --  232   < > =  values in this interval not displayed.   Cardiac Enzymes: No results for input(s): CKTOTAL, CKMB, CKMBINDEX, TROPONINI in the last 168 hours. BNP: Invalid input(s): POCBNP CBG: Recent Labs  Lab 01/27/20 2314 01/28/20 0815  GLUCAP 143* 78       Disposition and Follow-up: Discharge Instructions    (HEART FAILURE PATIENTS) Call MD:  Anytime  you have any of the following symptoms: 1) 3 pound weight gain in 24 hours or 5 pounds in 1 week 2) shortness of breath, with or without a dry hacking cough 3) swelling in the hands, feet or stomach 4) if you have to sleep on extra pillows at night in order to breathe.   Complete by: As directed    Diet - low sodium heart healthy   Complete by: As directed    Discharge instructions   Complete by: As directed    Please review all your medications.   Some of your medications have been changed at the time of the discharge.  Please stop taking combination pill of lisinopril/HCTZ.  You have been started on Lasix 20 mg daily and Toprol-XL 25 mg daily.   Increase activity slowly   Complete by: As directed        DISPOSITION Home   DISCHARGE FOLLOW-UP Follow-up Information    Marinus Maw, MD Follow up.   Specialty: Cardiology Why: We will arrange for follow-up stress testing and office follow-up, and contact you. Contact information: [address] [place] [address] [phone]        Clarence Balo, DO. Schedule an appointment as soon as possible for a visit in 2 week(s).   Specialty: Geriatric Medicine Contact information: [address] [place] [address] [phone]            Time coordinating discharge:  35 minutes  Signed:   Thad Ranger M.D. [facility] 01/28/2020, 1:26 PM

## 2020-01-28 NOTE — Progress Notes (Signed)
outpt myoview - heartcare

## 2020-01-28 NOTE — Progress Notes (Signed)
Discharge instructions given, patient waiting on home oxygen to be delivered for transportation.

## 2020-01-28 NOTE — Progress Notes (Signed)
Discharge instructions given and explained to patient, he verbalized understanding, patient denies any pain/distress, No wound noted. Accompanied home by friend

## 2020-01-29 ENCOUNTER — Other Ambulatory Visit: Payer: Self-pay

## 2020-01-29 ENCOUNTER — Ambulatory Visit: Payer: Medicare Other | Admitting: Internal Medicine

## 2020-01-29 ENCOUNTER — Telehealth: Payer: Self-pay | Admitting: *Deleted

## 2020-01-29 DIAGNOSIS — F17200 Nicotine dependence, unspecified, uncomplicated: Secondary | ICD-10-CM

## 2020-01-29 MED ORDER — ALBUTEROL SULFATE HFA 108 (90 BASE) MCG/ACT IN AERS: INHALATION_SPRAY | RESPIRATORY_TRACT | 0 refills | Status: DC

## 2020-01-29 NOTE — Telephone Encounter (Signed)
[name] requested refill.  Patient has an appointment for OV on 5//10/21.

## 2020-01-29 NOTE — Telephone Encounter (Signed)
[name] called and stated he was at pharmacy and they said he needed an Rx for a refill. He was last seen in the office in 6/20, but has an appointment scheduled for 4/29.

## 2020-01-29 NOTE — Telephone Encounter (Signed)
Transition Care Management Follow-up Telephone Call  Date of discharge and from where: 01/28/20 [place]  How have you been since you were released from the hospital? A lot better, feeling good  Any questions or concerns? No   Items Reviewed:  Did the pt receive and understand the discharge instructions provided? Yes   Medications obtained and verified? Yes   Any new allergies since your discharge? No   Dietary orders reviewed? Yes  Do you have support at home? Yes   Other (ie: DME, Home Health, etc) Home Health  Functional Questionnaire: (I = Independent and D = Dependent) ADL's: I  Bathing/Dressing- I   Meal Prep- I  Eating- I  Maintaining continence- I  Transferring/Ambulation- I  Managing Meds- I   Follow up appointments reviewed:    PCP Hospital f/u appt confirmed? Yes  Scheduled to see Dr. Renato Gails on 01/30/2020 @ 2:45.  Specialist Hospital f/u appt confirmed? Yes  Scheduled to see Cardiology on 02/09/20 .  Are transportation arrangements needed? No   If their condition worsens, is the pt aware to call  their PCP or go to the ED? Yes  Was the patient provided with contact information for the PCP's office or ED? Yes  Was the pt encouraged to call back with questions or concerns? Yes

## 2020-01-31 NOTE — Addendum Note (Signed)
Addended by: Karin Lieu T on: 01/31/2020 03:04 PM   Modules accepted: Level of Service, SmartSet

## 2020-01-31 NOTE — Telephone Encounter (Signed)
This encounter was created in error - please disregard.

## 2020-02-01 ENCOUNTER — Encounter: Payer: Self-pay | Admitting: Internal Medicine

## 2020-02-01 ENCOUNTER — Ambulatory Visit (INDEPENDENT_AMBULATORY_CARE_PROVIDER_SITE_OTHER): Payer: Medicare (Managed Care) | Admitting: Internal Medicine

## 2020-02-01 ENCOUNTER — Other Ambulatory Visit: Payer: Self-pay

## 2020-02-01 VITALS — BP 120/60 | HR 80 | Temp 97.7°F | Ht 70.0 in | Wt 148.6 lb

## 2020-02-01 DIAGNOSIS — A150 Tuberculosis of lung: Secondary | ICD-10-CM

## 2020-02-01 DIAGNOSIS — N1832 Chronic kidney disease, stage 3b: Secondary | ICD-10-CM | POA: Diagnosis not present

## 2020-02-01 DIAGNOSIS — E1169 Type 2 diabetes mellitus with other specified complication: Secondary | ICD-10-CM

## 2020-02-01 DIAGNOSIS — I10 Essential (primary) hypertension: Secondary | ICD-10-CM

## 2020-02-01 DIAGNOSIS — Z716 Tobacco abuse counseling: Secondary | ICD-10-CM

## 2020-02-01 DIAGNOSIS — Z550 Illiteracy and low-level literacy: Secondary | ICD-10-CM

## 2020-02-01 DIAGNOSIS — Z20828 Contact with and (suspected) exposure to other viral communicable diseases: Secondary | ICD-10-CM

## 2020-02-01 DIAGNOSIS — E1122 Type 2 diabetes mellitus with diabetic chronic kidney disease: Secondary | ICD-10-CM | POA: Diagnosis not present

## 2020-02-01 DIAGNOSIS — Z111 Encounter for screening for respiratory tuberculosis: Secondary | ICD-10-CM

## 2020-02-01 DIAGNOSIS — N183 Chronic kidney disease, stage 3 unspecified: Secondary | ICD-10-CM

## 2020-02-01 DIAGNOSIS — F17200 Nicotine dependence, unspecified, uncomplicated: Secondary | ICD-10-CM

## 2020-02-01 DIAGNOSIS — E785 Hyperlipidemia, unspecified: Secondary | ICD-10-CM

## 2020-02-01 MED ORDER — ASPIRIN EC 81 MG PO TBEC: 81.0000 mg | DELAYED_RELEASE_TABLET | Freq: Two times a day (BID) | ORAL | 3 refills | Status: DC

## 2020-02-01 MED ORDER — GLIMEPIRIDE 1 MG PO TABS: ORAL_TABLET | ORAL | 1 refills | Status: DC

## 2020-02-01 MED ORDER — BUPROPION HCL ER (XL) 150 MG PO TB24: ORAL_TABLET | ORAL | 1 refills | Status: DC

## 2020-02-01 MED ORDER — FERROUS SULFATE 325 (65 FE) MG PO TABS: 325.0000 mg | ORAL_TABLET | Freq: Two times a day (BID) | ORAL | 0 refills | Status: DC

## 2020-02-01 MED ORDER — ALBUTEROL SULFATE HFA 108 (90 BASE) MCG/ACT IN AERS: INHALATION_SPRAY | RESPIRATORY_TRACT | 11 refills | Status: DC

## 2020-02-01 MED ORDER — LOVASTATIN 20 MG PO TABS: ORAL_TABLET | ORAL | 1 refills | Status: DC

## 2020-02-01 MED ORDER — FLUTICASONE-SALMETEROL 250-50 MCG/DOSE IN AEPB: 1.0000 | INHALATION_SPRAY | Freq: Two times a day (BID) | RESPIRATORY_TRACT | 1 refills | Status: DC

## 2020-02-01 MED ORDER — LISINOPRIL 20 MG PO TABS: 20.0000 mg | ORAL_TABLET | Freq: Every day | ORAL | 5 refills | Status: DC

## 2020-02-01 NOTE — Progress Notes (Signed)
Location:  [facility] clinic Provider: Phoua Hoadley L. Renato Gails, D.O., C.M.D.  Goals of Care:  Advanced Directives 01/26/2020  Does Patient Have a Medical Advance Directive? No  Does patient want to make changes to medical advance directive? -  Would patient like information on creating a medical advance directive? No - Patient declined   Chief Complaint  Patient presents with  . Transitions Of Care    Discharged from [facility] 01/28/2020    HPI: Patient is a 74 y.o. male seen today for hospital follow-up s/p admission from 4/23-4/25/21 for chest pain--turned out he was having acute respiratory failure with hypoxia due to new LV dysfunction/CHF.  Lisinopril/hctz was stopped.  He's on plain lisinopril now and furosemide.    He'd been in the hospital for 70 days before with his TB.  He gets shots thru the health dept at this point for that and they are faxing [place] records.    He asks me about the bruises he has on his shoulder.  He has pain in his left rotator cuff.  He can lift his shoulder and move it, but there is a pain that stays in the shoulder. It goes from the shoulder down into his heart.    [name] says that he's been getting his injections with [facility] for his TB.  They've been told that the TB is gone.  Discharge summary from August showed that he had completed ethambutol and pyrazinamide and was to continue rifampin and isoniazid.    He says he was weak in his right leg after he was hospitalized.  No pain now.    He can breathe good sitting and talking and a little walking.  He likes having portable oxygen and he's going to get a bigger oxygen supply.  He has difficulty moving around with his dog and the big oxygen machine in the camper.  He needs enough oxygen; however.  sats here upon arrival was 94%.  He only wears the oxygen at night.  Uses albuterol early in am and late in evening. On advair.    Reviewed his diabetes:  On glimepiride.  His last hba1c 6.4.    He still does  occasionally smoke.  Counseled about risks with his oxygen of blowing himself up.  He does not cook on his propane--has electric with microwave.    He's gained 20 lbs back since he was treated for his TB.    Past Medical History:  Diagnosis Date  . Abnormal CT of the chest 2009   nodule 9 x 6 mm RUL  . Benign localized hyperplasia of prostate with urinary obstruction and other lower urinary tract symptoms (LUTS)(600.21)   . Cardiomegaly   . Cerebrovascular accident (stroke) (HCC) 2009   lacunar infarct in the left thalamus  . Cholelithiasis   . Chronic airway obstruction, not elsewhere classified   . Chronic kidney disease, stage III (moderate)   . Closed femur fracture (HCC) 1960  . Illiterate   . Obesity, unspecified   . Other and unspecified hyperlipidemia   . Tobacco use disorder   . Type II or unspecified type diabetes mellitus with renal manifestations, uncontrolled(250.42)   . Unspecified essential hypertension     Past Surgical History:  Procedure Laterality Date  . FEMUR FRACTURE SURGERY Left 1960    No Known Allergies  Outpatient Encounter Medications as of 02/01/2020  Medication Sig  . albuterol (VENTOLIN HFA) 108 (90 Base) MCG/ACT inhaler INHALE 1 TO 2 PUFFS BY MOUTH EVERY 4 TO  6 HOURS AS NEEDED FOR BREATHING  . aspirin EC 81 MG tablet Take 81 mg by mouth 2 (two) times daily.  Marland Kitchen buPROPion (WELLBUTRIN XL) 150 MG 24 hr tablet TAKE 1 TABLET(150 MG) BY MOUTH DAILY  . ferrous sulfate 325 (65 FE) MG tablet Take 1 tablet (325 mg total) by mouth 2 (two) times daily with a meal.  . Fluticasone-Salmeterol (ADVAIR DISKUS) 250-50 MCG/DOSE AEPB Inhale 1 puff into the lungs 2 (two) times daily.  . furosemide (LASIX) 20 MG tablet Take 1 tablet (20 mg total) by mouth daily.  Marland Kitchen glimepiride (AMARYL) 1 MG tablet TAKE 1 TABLET(1 MG) BY MOUTH DAILY WITH BREAKFAST  . isoniazid (NYDRAZID) 300 MG tablet Take 1 tablet (300 mg total) by mouth daily.  Marland Kitchen lisinopril (ZESTRIL) 20 MG tablet Take  1 tablet (20 mg total) by mouth daily.  Marland Kitchen lovastatin (MEVACOR) 20 MG tablet TAKE 1 TABLET(20 MG) BY MOUTH DAILY AT 6 PM  . metoprolol succinate (TOPROL XL) 25 MG 24 hr tablet Take 1 tablet (25 mg total) by mouth daily.  Marland Kitchen pyridOXINE (B-6) 50 MG tablet Take 1 tablet (50 mg total) by mouth daily.  . rifampin (RIFADIN) 300 MG capsule Take 2 capsules (600 mg total) by mouth daily.   No facility-administered encounter medications on file as of 02/01/2020.    Review of Systems:  Review of Systems  Constitutional: Positive for malaise/fatigue and weight loss. Negative for chills and fever.  HENT: Negative for congestion, hearing loss and sore throat.   Eyes: Negative for blurred vision.  Respiratory: Positive for cough and shortness of breath. Negative for hemoptysis and wheezing.   Cardiovascular: Positive for chest pain. Negative for palpitations, orthopnea, leg swelling and PND.       Had large bruise left shoulder onto left chest at the time this was going on by his report  Gastrointestinal: Negative for abdominal pain, blood in stool, constipation, diarrhea and melena.  Genitourinary: Negative for dysuria.  Musculoskeletal: Negative for falls and joint pain.       Strength improving  Skin: Negative for itching and rash.  Neurological: Positive for weakness. Negative for dizziness and loss of consciousness.  Endo/Heme/Allergies: Bruises/bleeds easily.  Psychiatric/Behavioral: Negative for depression and memory loss. The patient is not nervous/anxious and does not have insomnia.        Spirits much improved by his and [name]'s reports    Health Maintenance  Topic Date Due  . Hepatitis C Screening  Never done  . OPHTHALMOLOGY EXAM  Never done  . COVID-19 Vaccine (1) Never done  . COLONOSCOPY  Never done  . FOOT EXAM  03/22/2019  . INFLUENZA VACCINE  05/05/2020  . HEMOGLOBIN A1C  07/27/2020  . TETANUS/TDAP  01/10/2022  . PNA vac Low Risk Adult  Completed    Physical Exam: Vitals:    02/01/20 1522  BP: 120/60  Pulse: 80  Temp: 97.7 F (36.5 C)  TempSrc: Temporal  SpO2: 94%  Weight: 148 lb 9.6 oz (67.4 kg)  Height: 5\' 10"  (1.778 m)   Body mass index is 21.32 kg/m. Physical Exam Vitals reviewed.  Constitutional:      General: He is not in acute distress.    Appearance: Normal appearance. He is ill-appearing. He is not toxic-appearing.  HENT:     Head: Normocephalic and atraumatic.  Cardiovascular:     Rate and Rhythm: Normal rate and regular rhythm.  Pulmonary:     Effort: Pulmonary effort is normal.     Breath  sounds: Rhonchi present.  Abdominal:     General: Bowel sounds are normal.     Palpations: Abdomen is soft.     Tenderness: There is no abdominal tenderness.  Musculoskeletal:        General: Normal range of motion.     Right lower leg: No edema.     Left lower leg: No edema.  Skin:    General: Skin is warm and dry.     Capillary Refill: Capillary refill takes less than 2 seconds.  Neurological:     General: No focal deficit present.     Mental Status: He is alert and oriented to person, place, and time.     Gait: Gait abnormal.     Comments: Now walks more stooped over and leans on something when he can; sats in normal range here  Psychiatric:        Mood and Affect: Mood normal.        Behavior: Behavior normal.     Labs reviewed: Basic Metabolic Panel: Recent Labs    04/03/19 0852 05/15/19 1627 05/16/19 0426 05/17/19 0441 05/23/19 0209 05/23/19 1908 05/30/19 1552 06/01/19 5409 06/21/19 0450 06/22/19 0359 07/15/19 0520 07/15/19 0520 07/19/19 0511 01/26/20 1500 01/28/20 0505  NA 139   < > 132*   < >  --    < >  --    < > 134*   < > 134*   < > 134* 142 137  K 4.3   < > 3.4*   < >  --    < >  --    < > 3.7   < > 4.1   < > 3.9 3.9 3.6  CL 101   < > 100   < >  --    < >  --    < > 106   < > 100   < > 101 103 99  CO2 26   < > 24   < >  --    < >  --    < > 21*   < > 22   < > GLUCOSE 94   < > 104*   < >  --    < >  --     < > 89   < > 103*   < > 99 133* 57*  BUN 35*   < > 23   < >  --    < >  --    < > 31*   < > 41*   < > 35* 27* 29*  CREATININE 1.77*   < > 1.34*   < >  --    < >  --    < > 1.45*   < > 1.32*   < > 1.29* 1.43* 1.58*  CALCIUM 9.6   < > 7.7*   < >  --    < >  --    < > 8.6*   < > 9.7   < > 9.1 9.4 9.2  MG  --    < > 2.0   < > 2.2  --   --    < > 2.0  --  2.3  --  2.1  --   --   PHOS  --    < > 2.3*  --  4.0  --   --   --   --   --  5.0*  --  3.8  --   --  TSH 0.65  --  0.253*  --   --   --  4.117  --   --   --   --   --   --   --   --    < > = values in this interval not displayed.   Liver Function Tests: Recent Labs    07/12/19 0431 07/15/19 0520 07/19/19 0511  AST ALT ALKPHOS 66 68 68  BILITOT 0.4 0.8 0.7  PROT 6.4* 6.9 6.5  ALBUMIN 2.8* 3.2* 2.9*   No results for input(s): LIPASE, AMYLASE in the last 8760 hours. No results for input(s): AMMONIA in the last 8760 hours. CBC: Recent Labs    06/17/19 1010 06/19/19 0530 07/15/19 0520 07/15/19 0520 07/19/19 0511 07/19/19 0511 01/26/20 1500 01/27/20 0412 01/28/20 0505  WBC 6.9   < > 6.5   < > 7.0   < > 6.7 6.3 5.7  NEUTROABS 4.1  --  4.1  --  4.3  --   --   --   --   HGB 11.2*   < > 12.9*   < > 12.9*   < > 12.8* 11.8* 12.8*  HCT 38.4*   < > 41.2   < > 41.6   < > 41.5 38.5* 42.0  MCV 87.1   < > 85.7   < > 84.6   < > 83.3 85.4 84.5  PLT 285   < > 230   < > 204   < > 217 196 232   < > = values in this interval not displayed.   Lipid Panel: Recent Labs    04/03/19 0852 01/26/20 2053  CHOL 138 148  HDL 36* 38*  LDLCALC 84 101*  TRIG 89 45  CHOLHDL 3.8 3.9   Lab Results  Component Value Date   HGBA1C 6.4 (H) 01/26/2020    Procedures since last visit: DG Chest 2 View  Result Date: 01/26/2020 CLINICAL DATA:  Shortness of breath. Pt with recent TB treatment. EXAM: CHEST - 2 VIEW COMPARISON:  Chest radiograph 12/08/2019 FINDINGS: Stable cardiomediastinal contours. Aortic arch calcifications.  Extensive bilateral left greater than right coarse interstitial opacities, similar to prior. No new focal consolidation. Persistent mild left pleural thickening or effusion. No pneumothorax. No acute finding in the visualized skeleton. IMPRESSION: 1. No acute cardiopulmonary process. 2. Extensive bilateral left greater than right coarse interstitial opacities, similar to prior, likely post infectious scarring. Persistent mild left pleural thickening or effusion. Electronically Signed   By: Emmaline Kluver M.D.   On: 01/26/2020 15:49   CT Angio Chest PE W and/or Wo Contrast  Result Date: 01/26/2020 CLINICAL DATA:  Short of breath and chest pain for 2 days EXAM: CT ANGIOGRAPHY CHEST WITH CONTRAST TECHNIQUE: Multidetector CT imaging of the chest was performed using the standard protocol during bolus administration of intravenous contrast. Multiplanar CT image reconstructions and MIPs were obtained to evaluate the vascular anatomy. CONTRAST:  OMNIPAQUE IOHEXOL 350 MG/ML SOLN COMPARISON:  01/26/2020, 05/15/2019 FINDINGS: Cardiovascular: This is a technically adequate evaluation of the pulmonary vasculature. There are no acute filling defects or pulmonary emboli. The heart is mildly enlarged without pericardial effusion. Atherosclerosis of the aorta and coronary vasculature again noted unchanged. Normal caliber of the thoracic aorta. Mediastinum/Nodes: Stable subcentimeter mediastinal lymph nodes without pathologic adenopathy. Narrowing of the bilateral mainstem bronchi likely as a result of tracheal bronchomalacia. No bronchial wall thickening. The thyroid and esophagus are unremarkable. Lungs/Pleura: Bilateral  areas of pleural and parenchymal scarring are again noted. A large cavity seen within the lung apices on prior study have decreased in size. Overall, the nodular areas of consolidation seen on prior study have all decreased in size and conspicuity since previous study, likely reflecting improved  atypical infection such as fungal. Please correlate with any previous pulmonology workup. There are no new areas of consolidation. Trace bilateral pleural effusions are noted. No pneumothorax. There is background emphysema. Upper Abdomen: Calcified gallstones are identified. Left renal atrophy again noted. Musculoskeletal: No acute or destructive bony lesions. Reconstructed images demonstrate no additional findings. Review of the MIP images confirms the above findings. IMPRESSION: 1. Persistent but improved widespread bilateral nodular consolidation and cavitation. Overall, findings would favor improved atypical infection such as fungal pneumonia. Please correlate with any previous pulmonology workup or sputum analysis. 2. No evidence of acute pulmonary embolus. 3. Trace bilateral pleural effusions. 4. Aortic Atherosclerosis (ICD10-I70.0) and Emphysema (ICD10-J43.9). Electronically Signed   By: Sharlet Salina M.D.   On: 01/26/2020 18:23   ECHOCARDIOGRAM COMPLETE  Result Date: 01/27/2020    ECHOCARDIOGRAM REPORT   Patient Name:   Clarence Maldonado Date of Exam: 01/27/2020 Medical Rec #:  [id number]        Height:       70.0 in Accession #:    [id number]       Weight:       145.9 lb Date of Birth:  1946/05/08        BSA:          1.826 m Patient Age:    74 years         BP:           159/92 mmHg Patient Gender: M                HR:           97 bpm. Exam Location:  Inpatient Procedure: 2D Echo Indications:    786.50 chest pain  History:        Patient has prior history of Echocardiogram examinations, most                 recent 05/16/2019. Cardiomegaly; Risk Factors:Hypertension,                 Diabetes, Dyslipidemia and Former Smoker. Tobacco abuse. CVA.  Sonographer:    Celene Skeen RDCS (AE) Referring Phys: [id number] [facility] Jerelyn Charles  Sonographer Comments: heavy breathing during exam made imaging challenging at times IMPRESSIONS  1. Left ventricular ejection fraction, by estimation, is 30 to 35%. The left ventricle  has moderately decreased function. The left ventricle demonstrates global hypokinesis. There is mild left ventricular hypertrophy. Left ventricular diastolic parameters are indeterminate.  2. Right ventricular systolic function is normal. The right ventricular size is normal.  3. The mitral valve is degenerative. Mild mitral valve regurgitation. No evidence of mitral stenosis.  4. The aortic valve is tricuspid. Aortic valve regurgitation is mild. Mild to moderate aortic valve sclerosis/calcification is present, without any evidence of aortic stenosis.  5. The inferior vena cava is normal in size with greater than 50% respiratory variability, suggesting right atrial pressure of 3 mmHg. Comparison(s): Prior images reviewed side by side. The left ventricular function is significantly worse. FINDINGS  Left Ventricle: Left ventricular ejection fraction, by estimation, is 30 to 35%. The left ventricle has moderately decreased function. The left ventricle demonstrates global hypokinesis. The left ventricular internal cavity size was normal in size.  There is mild left ventricular hypertrophy. Left ventricular diastolic parameters are indeterminate. Right Ventricle: The right ventricular size is normal. No increase in right ventricular wall thickness. Right ventricular systolic function is normal. Left Atrium: Left atrial size was normal in size. Right Atrium: Right atrial size was normal in size. Pericardium: There is no evidence of pericardial effusion. Mitral Valve: The mitral valve is degenerative in appearance. There is mild thickening of the mitral valve leaflet(s). There is mild calcification of the mitral valve leaflet(s). Normal mobility of the mitral valve leaflets. Severe mitral annular calcification. Mild mitral valve regurgitation. No evidence of mitral valve stenosis. Tricuspid Valve: The tricuspid valve is normal in structure. Tricuspid valve regurgitation is not demonstrated. No evidence of tricuspid  stenosis. Aortic Valve: The aortic valve is tricuspid. Aortic valve regurgitation is mild. Mild to moderate aortic valve sclerosis/calcification is present, without any evidence of aortic stenosis. Pulmonic Valve: The pulmonic valve was normal in structure. Pulmonic valve regurgitation is not visualized. No evidence of pulmonic stenosis. Aorta: The aortic root is normal in size and structure. Venous: The inferior vena cava is normal in size with greater than 50% respiratory variability, suggesting right atrial pressure of 3 mmHg. IAS/Shunts: No atrial level shunt detected by color flow Doppler.  LEFT VENTRICLE PLAX 2D LVIDd:         5.00 cm  Diastology LVIDs:         4.40 cm  LV e' lateral:   5.33 cm/s LV PW:         1.30 cm  LV E/e' lateral: 28.3 LV IVS:        1.10 cm LVOT diam:     2.35 cm LV SV:         72 LV SV Index:   39 LVOT Area:     4.34 cm  LEFT ATRIUM           Index LA diam:      3.80 cm 2.08 cm/m LA Vol (A4C): 39.5 ml 21.64 ml/m  AORTIC VALVE LVOT Vmax:   90.50 cm/s LVOT Vmean:  54.400 cm/s LVOT VTI:    0.165 m  AORTA Ao Root diam: 3.60 cm MITRAL VALVE MV Area (PHT): 3.30 cm     SHUNTS MV Decel Time: 230 msec     Systemic VTI:  0.16 m MV E velocity: 151.00 cm/s  Systemic Diam: 2.35 cm Donato Schultz MD Electronically signed by Donato Schultz MD Signature Date/Time: 01/27/2020/2:13:08 PM    Final     Assessment/Plan 1. Pulmonary TB -continues is course of isoniazid, rifampin, pyridoxine and claritin as per ID and health dept--they come and administer during the week and then give him enough to take for the weekend -he's improved considerably with his strength and now ambulates around (apparently right leg was weak for some reason)  2. Tobacco use disorder -ongoing which is scary with his oxygen use in his trailer/mobile home--educated on danger of this and ongoing risks of smoking - albuterol (VENTOLIN HFA) 108 (90 Base) MCG/ACT inhaler; INHALE 1 TO 2 PUFFS BY MOUTH EVERY 4 TO 6 HOURS AS NEEDED  FOR BREATHING  Dispense: 18 g; Refill: 11 - buPROPion (WELLBUTRIN XL) 150 MG 24 hr tablet; TAKE 1 TABLET(150 MG) BY MOUTH DAILY  Dispense: 90 tablet; Refill: 1  3. Controlled type 2 diabetes mellitus with stage 3 chronic kidney disease, without long-term current use of insulin (HCC) Lab Results  Component Value Date   HGBA1C 6.4 (H) 01/26/2020  -keep may appt,  but scheduled for 4 mos f/u also to come fasting for labs so we get those (he does not like coming to the doctor and often is a no show) - glimepiride (AMARYL) 1 MG tablet; TAKE 1 TABLET(1 MG) BY MOUTH DAILY WITH BREAKFAST  Dispense: 90 tablet; Refill: 1  4. Stage 3b chronic kidney disease -Avoid nephrotoxic agents like nsaids, dose adjust renally excreted meds, hydrate. -f/u labs in 4 mos  5. Hyperlipidemia associated with type 2 diabetes mellitus ([place]) - [name] was commenting about how he "doesn't want him on this" but I reminded them he was just hospitalized for chest pain and he needs it -f/u lipids in 4 mos, as well - lovastatin (MEVACOR) 20 MG tablet; TAKE 1 TABLET(20 MG) BY MOUTH DAILY AT 6 PM  Dispense: 90 tablet; Refill: 1  6. Essential hypertension -bp is well controlled at present with his current regimen, cont same  7. Illiterate -notable--[name] helps him keep track of things and I'm getting concerned that [name]'s memory is slipping -it was challenging to clarify meds pt had and still needed sent to pharmacy -apparently, he's only been taking his TB meds for a while b/c he's not had the others!!!  8. Encounter for smoking cessation counseling - more than 3 mins spent counseling on quitting smoking given his severe underlying lung disease, dyspnea, chronic cough - buPROPion (WELLBUTRIN XL) 150 MG 24 hr tablet; TAKE 1 TABLET(150 MG) BY MOUTH DAILY  Dispense: 90 tablet; Refill: 1  Labs/tests ordered:   Lab Orders     CBC with Differential/Platelet     COMPLETE METABOLIC PANEL WITH GFR     Lipid panel     Hemoglobin  A1c  Next appt:  02/12/2020; also 4 month f/u come fasting for labs Medications were thoroughly reconciled  Rekita Miotke L. Judithann Villamar, D.O. [facility] [address], [place] [address] Cell Phone (Mon-Fri 8am-5pm):  [phone] On Call:  [phone] & follow prompts after 5pm & weekends Office Phone:  [phone] Office Fax:  [phone]

## 2020-02-03 DIAGNOSIS — J449 Chronic obstructive pulmonary disease, unspecified: Secondary | ICD-10-CM | POA: Diagnosis not present

## 2020-02-09 ENCOUNTER — Inpatient Hospital Stay (HOSPITAL_COMMUNITY): Admission: RE | Admit: 2020-02-09 | Payer: Medicare HMO | Source: Ambulatory Visit

## 2020-02-12 ENCOUNTER — Encounter: Payer: Self-pay | Admitting: Internal Medicine

## 2020-02-12 ENCOUNTER — Ambulatory Visit (INDEPENDENT_AMBULATORY_CARE_PROVIDER_SITE_OTHER): Payer: Medicare HMO | Admitting: Internal Medicine

## 2020-02-12 ENCOUNTER — Other Ambulatory Visit: Payer: Self-pay

## 2020-02-12 VITALS — BP 132/92 | HR 97 | Temp 97.7°F | Ht 70.0 in | Wt 134.0 lb

## 2020-02-12 DIAGNOSIS — E1122 Type 2 diabetes mellitus with diabetic chronic kidney disease: Secondary | ICD-10-CM | POA: Diagnosis not present

## 2020-02-12 DIAGNOSIS — N183 Chronic kidney disease, stage 3 unspecified: Secondary | ICD-10-CM | POA: Diagnosis not present

## 2020-02-12 DIAGNOSIS — I10 Essential (primary) hypertension: Secondary | ICD-10-CM

## 2020-02-12 DIAGNOSIS — F17200 Nicotine dependence, unspecified, uncomplicated: Secondary | ICD-10-CM | POA: Diagnosis not present

## 2020-02-12 DIAGNOSIS — R413 Other amnesia: Secondary | ICD-10-CM

## 2020-02-12 DIAGNOSIS — A150 Tuberculosis of lung: Secondary | ICD-10-CM

## 2020-02-12 DIAGNOSIS — I952 Hypotension due to drugs: Secondary | ICD-10-CM

## 2020-02-12 MED ORDER — METOPROLOL SUCCINATE ER 25 MG PO TB24: 12.5000 mg | ORAL_TABLET | Freq: Every day | ORAL | 11 refills | Status: DC

## 2020-02-12 NOTE — Patient Instructions (Addendum)
Stop lisinopril. Restart your metoprolol, but at 12.5mg  daily (1/2 of a 25mg ).    Keep your lab and visit with me 8/30.    My impression is that your memory loss is due to vascular disease (circulation problems).    Please use a pillbox for your medicines.

## 2020-02-12 NOTE — Progress Notes (Signed)
Location:  [facility] clinic Provider:  Debhora Titus L. Renato Gails, D.O., C.M.D.  Code Status: full code--we'll discuss this more next time--still trying to build rapport with him  Goals of Care:  Advanced Directives 02/12/2020  Does Patient Have a Medical Advance Directive? -  Does patient want to make changes to medical advance directive? No - Patient declined  Would patient like information on creating a medical advance directive? No - Patient declined     Chief Complaint  Patient presents with  . Medical Management of Chronic Issues     follow up from ED discharge 01/28/2020    HPI: Patient is a 74 y.o. male seen today for med mgt of chronic diseases.  He is accompanied by his friend, [name], who helps him with his meds, transportation, etc.    He is not taking his bp meds due to bp being around 100 systolic.  He took them for 2 days early in the am.  Not dizzy.  He had no energy to go do anything.  It's been 3 days since had them.  Today, BP's 132/92.    Says he's not retaining information.  Says he does not keep track of days.  He won't say what month it is--guessed march.  It's spring.  2009.  At [employer] office in [place], [place].  [place].  First floor.  Does not remember my name.  He will sit and watch tv.  He's supposed to get something in the bedroom and forgets half way there what it was.  His memory has changed per [name].  He notices this primarily over just the last month.  I'm not aware of any side effect from the TB meds which [name] keeps asking--suspect it's due to vascular disease and prolonged hospitalization and hypoxia with his TB.    Continues to smoke.  Says once in a while.  Says he is smoking 1/2ppd.  He does not wish to quit.  He eats only one meal per day, but does admit to snacks of popcorn and chips, etc.    He is not walking.  He's afraid he'll fall out by himself.  [name] has been encouraging him to exercise.  Has a cardiology appt for a stress test next week.     Past Medical History:  Diagnosis Date  . Abnormal CT of the chest 2009   nodule 9 x 6 mm RUL  . Benign localized hyperplasia of prostate with urinary obstruction and other lower urinary tract symptoms (LUTS)(600.21)   . Cardiomegaly   . Cerebrovascular accident (stroke) (HCC) 2009   lacunar infarct in the left thalamus  . Cholelithiasis   . Chronic airway obstruction, not elsewhere classified   . Chronic kidney disease, stage III (moderate)   . Closed femur fracture (HCC) 1960  . Contact with or exposure to viral disease    suspected   . Illiterate   . Obesity, unspecified   . Other and unspecified hyperlipidemia   . Special screening examination for respiratory tuberculosis   . Tobacco use disorder   . Tuberculosis   . Type II or unspecified type diabetes mellitus with renal manifestations, uncontrolled(250.42)   . Unspecified essential hypertension     Past Surgical History:  Procedure Laterality Date  . FEMUR FRACTURE SURGERY Left 1960    No Known Allergies  Outpatient Encounter Medications as of 02/12/2020  Medication Sig  . albuterol (VENTOLIN HFA) 108 (90 Base) MCG/ACT inhaler INHALE 1 TO 2 PUFFS BY MOUTH EVERY 4 TO  6 HOURS AS NEEDED FOR BREATHING  . aspirin EC 81 MG tablet Take 1 tablet (81 mg total) by mouth in the morning and at bedtime.  Marland Kitchen buPROPion (WELLBUTRIN XL) 150 MG 24 hr tablet TAKE 1 TABLET(150 MG) BY MOUTH DAILY  . ferrous sulfate 325 (65 FE) MG tablet Take 1 tablet (325 mg total) by mouth 2 (two) times daily with a meal.  . Fluticasone-Salmeterol (ADVAIR DISKUS) 250-50 MCG/DOSE AEPB Inhale 1 puff into the lungs 2 (two) times daily.  . furosemide (LASIX) 20 MG tablet Take 1 tablet (20 mg total) by mouth daily.  Marland Kitchen glimepiride (AMARYL) 1 MG tablet TAKE 1 TABLET(1 MG) BY MOUTH DAILY WITH BREAKFAST  . isoniazid (NYDRAZID) 300 MG tablet Take 1 tablet (300 mg total) by mouth daily.  Marland Kitchen lisinopril (ZESTRIL) 20 MG tablet Take 1 tablet (20 mg total) by mouth  daily.  Marland Kitchen loratadine (CLARITIN) 10 MG tablet Take 10 mg by mouth daily.  Marland Kitchen lovastatin (MEVACOR) 20 MG tablet TAKE 1 TABLET(20 MG) BY MOUTH DAILY AT 6 PM  . metoprolol succinate (TOPROL XL) 25 MG 24 hr tablet Take 1 tablet (25 mg total) by mouth daily.  Marland Kitchen pyridOXINE (B-6) 50 MG tablet Take 1 tablet (50 mg total) by mouth daily.  . rifampin (RIFADIN) 300 MG capsule Take 2 capsules (600 mg total) by mouth daily.   No facility-administered encounter medications on file as of 02/12/2020.    Review of Systems:  Review of Systems  Constitutional: Positive for malaise/fatigue and weight loss. Negative for chills and fever.       Down 14-15 lbs since end of last month already  HENT: Negative for congestion and sore throat.   Eyes: Negative for blurred vision.  Respiratory: Positive for cough and shortness of breath. Negative for hemoptysis, sputum production and wheezing.   Cardiovascular: Positive for chest pain. Negative for palpitations, orthopnea, claudication, leg swelling and PND.       Was that one time when he went to ED  Gastrointestinal: Negative for abdominal pain, blood in stool, constipation and melena.  Genitourinary: Negative for dysuria.  Musculoskeletal: Positive for falls. Negative for joint pain.  Neurological: Negative for dizziness and loss of consciousness.  Endo/Heme/Allergies: Bruises/bleeds easily.  Psychiatric/Behavioral: Positive for memory loss. Negative for depression. The patient is not nervous/anxious and does not have insomnia.        Not educated    Health Maintenance  Topic Date Due  . Hepatitis C Screening  Never done  . OPHTHALMOLOGY EXAM  Never done  . COVID-19 Vaccine (1) Never done  . COLONOSCOPY  Never done  . FOOT EXAM  03/22/2019  . INFLUENZA VACCINE  05/05/2020  . HEMOGLOBIN A1C  07/27/2020  . TETANUS/TDAP  01/10/2022  . PNA vac Low Risk Adult  Completed    Physical Exam: Vitals:   02/12/20 1141  BP: (!) 132/92  Pulse: 97  Temp: 97.7 F  (36.5 C)  TempSrc: Temporal  SpO2: 98%  Weight: 134 lb (60.8 kg)  Height:  (1.778 m)   Body mass index is 19.23 kg/m. Physical Exam Vitals reviewed.  Constitutional:      General: He is not in acute distress.    Appearance: He is not toxic-appearing.     Comments: Thin male, walks stooped over, strong smoke odor  HENT:     Head: Normocephalic and atraumatic.  Eyes:     Pupils: Pupils are equal, round, and reactive to light.  Cardiovascular:  Rate and Rhythm: Normal rate and regular rhythm.     Pulses: Normal pulses.     Heart sounds: Normal heart sounds.  Pulmonary:     Effort: Pulmonary effort is normal.     Breath sounds: Rhonchi present. No wheezing or rales.  Abdominal:     General: Bowel sounds are normal.     Palpations: Abdomen is soft.  Musculoskeletal:        General: Normal range of motion.     Right lower leg: No edema.     Left lower leg: No edema.  Skin:    General: Skin is warm and dry.  Neurological:     General: No focal deficit present.     Mental Status: He is alert and oriented to person, place, and time.     Cranial Nerves: No cranial nerve deficit.     Gait: Gait abnormal.  Psychiatric:        Mood and Affect: Mood normal.     Comments: Pleasant but does not easily understand medical jargon     Labs reviewed: Basic Metabolic Panel: Recent Labs    04/03/19 0852 05/15/19 1627 05/16/19 0426 05/17/19 0441 05/23/19 0209 05/23/19 1908 05/30/19 1552 06/01/19 1610 06/21/19 0450 06/22/19 0359 07/15/19 0520 07/15/19 0520 07/19/19 0511 01/26/20 1500 01/28/20 0505  NA 139   < > 132*   < >  --    < >  --    < > 134*   < > 134*   < > 134* 142 137  K 4.3   < > 3.4*   < >  --    < >  --    < > 3.7   < > 4.1   < > 3.9 3.9 3.6  CL 101   < > 100   < >  --    < >  --    < > 106   < > 100   < > 101 103 99  CO2 26   < > 24   < >  --    < >  --    < > 21*   < > 22   < > 23 28 26   GLUCOSE 94   < > 104*   < >  --    < >  --    < > 89   < >  103*   < > 99 133* 57*  BUN 35*   < > 23   < >  --    < >  --    < > 31*   < > 41*   < > 35* 27* 29*  CREATININE 1.77*   < > 1.34*   < >  --    < >  --    < > 1.45*   < > 1.32*   < > 1.29* 1.43* 1.58*  CALCIUM 9.6   < > 7.7*   < >  --    < >  --    < > 8.6*   < > 9.7   < > 9.1 9.4 9.2  MG  --    < > 2.0   < > 2.2  --   --    < > 2.0  --  2.3  --  2.1  --   --   PHOS  --    < > 2.3*  --  4.0  --   --   --   --   --  5.0*  --  3.8  --   --   TSH 0.65  --  0.253*  --   --   --  4.117  --   --   --   --   --   --   --   --    < > = values in this interval not displayed.   Liver Function Tests: Recent Labs    07/12/19 0431 07/15/19 0520 07/19/19 0511  AST 18 20 17   ALT 9 12 12   ALKPHOS 66 68 68  BILITOT 0.4 0.8 0.7  PROT 6.4* 6.9 6.5  ALBUMIN 2.8* 3.2* 2.9*   No results for input(s): LIPASE, AMYLASE in the last 8760 hours. No results for input(s): AMMONIA in the last 8760 hours. CBC: Recent Labs    06/17/19 1010 06/19/19 0530 07/15/19 0520 07/15/19 0520 07/19/19 0511 07/19/19 0511 01/26/20 1500 01/27/20 0412 01/28/20 0505  WBC 6.9   < > 6.5   < > 7.0   < > 6.7 6.3 5.7  NEUTROABS 4.1  --  4.1  --  4.3  --   --   --   --   HGB 11.2*   < > 12.9*   < > 12.9*   < > 12.8* 11.8* 12.8*  HCT 38.4*   < > 41.2   < > 41.6   < > 41.5 38.5* 42.0  MCV 87.1   < > 85.7   < > 84.6   < > 83.3 85.4 84.5  PLT 285   < > 230   < > 204   < > 217 196 232   < > = values in this interval not displayed.   Lipid Panel: Recent Labs    04/03/19 0852 01/26/20 2053  CHOL 138 148  HDL 36* 38*  LDLCALC 84 101*  TRIG 89 45  CHOLHDL 3.8 3.9   Lab Results  Component Value Date   HGBA1C 6.4 (H) 01/26/2020    Procedures since last visit: DG Chest 2 View  Result Date: 01/26/2020 CLINICAL DATA:  Shortness of breath. Pt with recent TB treatment. EXAM: CHEST - 2 VIEW COMPARISON:  Chest radiograph 12/08/2019 FINDINGS: Stable cardiomediastinal contours. Aortic arch calcifications. Extensive bilateral  left greater than right coarse interstitial opacities, similar to prior. No new focal consolidation. Persistent mild left pleural thickening or effusion. No pneumothorax. No acute finding in the visualized skeleton. IMPRESSION: 1. No acute cardiopulmonary process. 2. Extensive bilateral left greater than right coarse interstitial opacities, similar to prior, likely post infectious scarring. Persistent mild left pleural thickening or effusion. Electronically Signed   By: 01/28/2020 M.D.   On: 01/26/2020 15:49   CT Angio Chest PE W and/or Wo Contrast  Result Date: 01/26/2020 CLINICAL DATA:  Short of breath and chest pain for 2 days EXAM: CT ANGIOGRAPHY CHEST WITH CONTRAST TECHNIQUE: Multidetector CT imaging of the chest was performed using the standard protocol during bolus administration of intravenous contrast. Multiplanar CT image reconstructions and MIPs were obtained to evaluate the vascular anatomy. CONTRAST:  09-19-1992 OMNIPAQUE IOHEXOL 350 MG/ML SOLN COMPARISON:  01/26/2020, 05/15/2019 FINDINGS: Cardiovascular: This is a technically adequate evaluation of the pulmonary vasculature. There are no acute filling defects or pulmonary emboli. The heart is mildly enlarged without pericardial effusion. Atherosclerosis of the aorta and coronary vasculature again noted unchanged. Normal caliber of the thoracic aorta. Mediastinum/Nodes: Stable subcentimeter mediastinal lymph nodes without pathologic adenopathy. Narrowing of the bilateral mainstem bronchi likely as a result of tracheal bronchomalacia.  No bronchial wall thickening. The thyroid and esophagus are unremarkable. Lungs/Pleura: Bilateral areas of pleural and parenchymal scarring are again noted. A large cavity seen within the lung apices on prior study have decreased in size. Overall, the nodular areas of consolidation seen on prior study have all decreased in size and conspicuity since previous study, likely reflecting improved atypical infection such as  fungal. Please correlate with any previous pulmonology workup. There are no new areas of consolidation. Trace bilateral pleural effusions are noted. No pneumothorax. There is background emphysema. Upper Abdomen: Calcified gallstones are identified. Left renal atrophy again noted. Musculoskeletal: No acute or destructive bony lesions. Reconstructed images demonstrate no additional findings. Review of the MIP images confirms the above findings. IMPRESSION: 1. Persistent but improved widespread bilateral nodular consolidation and cavitation. Overall, findings would favor improved atypical infection such as fungal pneumonia. Please correlate with any previous pulmonology workup or sputum analysis. 2. No evidence of acute pulmonary embolus. 3. Trace bilateral pleural effusions. 4. Aortic Atherosclerosis (ICD10-I70.0) and Emphysema (ICD10-J43.9). Electronically Signed   By: Sharlet Salina M.D.   On: 01/26/2020 18:23   ECHOCARDIOGRAM COMPLETE  Result Date: 01/27/2020    ECHOCARDIOGRAM REPORT   Patient Name:   Clarence Maldonado Date of Exam: 01/27/2020 Medical Rec #:  [id number]        Height:       70.0 in Accession #:    [id number]       Weight:       145.9 lb Date of Birth:  11/18/1945        BSA:          1.826 m Patient Age:    74 years         BP:           159/92 mmHg Patient Gender: M                HR:           97 bpm. Exam Location:  Inpatient Procedure: 2D Echo Indications:    786.50 chest pain  History:        Patient has prior history of Echocardiogram examinations, most                 recent 05/16/2019. Cardiomegaly; Risk Factors:Hypertension,                 Diabetes, Dyslipidemia and Former Smoker. Tobacco abuse. CVA.  Sonographer:    Celene Skeen RDCS (AE) Referring Phys: [id number] [facility] Jerelyn Charles  Sonographer Comments: heavy breathing during exam made imaging challenging at times IMPRESSIONS  1. Left ventricular ejection fraction, by estimation, is 30 to 35%. The left ventricle has moderately decreased  function. The left ventricle demonstrates global hypokinesis. There is mild left ventricular hypertrophy. Left ventricular diastolic parameters are indeterminate.  2. Right ventricular systolic function is normal. The right ventricular size is normal.  3. The mitral valve is degenerative. Mild mitral valve regurgitation. No evidence of mitral stenosis.  4. The aortic valve is tricuspid. Aortic valve regurgitation is mild. Mild to moderate aortic valve sclerosis/calcification is present, without any evidence of aortic stenosis.  5. The inferior vena cava is normal in size with greater than 50% respiratory variability, suggesting right atrial pressure of 3 mmHg. Comparison(s): Prior images reviewed side by side. The left ventricular function is significantly worse. FINDINGS  Left Ventricle: Left ventricular ejection fraction, by estimation, is 30 to 35%. The left ventricle has moderately decreased function. The left ventricle demonstrates  global hypokinesis. The left ventricular internal cavity size was normal in size. There is mild left ventricular hypertrophy. Left ventricular diastolic parameters are indeterminate. Right Ventricle: The right ventricular size is normal. No increase in right ventricular wall thickness. Right ventricular systolic function is normal. Left Atrium: Left atrial size was normal in size. Right Atrium: Right atrial size was normal in size. Pericardium: There is no evidence of pericardial effusion. Mitral Valve: The mitral valve is degenerative in appearance. There is mild thickening of the mitral valve leaflet(s). There is mild calcification of the mitral valve leaflet(s). Normal mobility of the mitral valve leaflets. Severe mitral annular calcification. Mild mitral valve regurgitation. No evidence of mitral valve stenosis. Tricuspid Valve: The tricuspid valve is normal in structure. Tricuspid valve regurgitation is not demonstrated. No evidence of tricuspid stenosis. Aortic Valve: The  aortic valve is tricuspid. Aortic valve regurgitation is mild. Mild to moderate aortic valve sclerosis/calcification is present, without any evidence of aortic stenosis. Pulmonic Valve: The pulmonic valve was normal in structure. Pulmonic valve regurgitation is not visualized. No evidence of pulmonic stenosis. Aorta: The aortic root is normal in size and structure. Venous: The inferior vena cava is normal in size with greater than 50% respiratory variability, suggesting right atrial pressure of 3 mmHg. IAS/Shunts: No atrial level shunt detected by color flow Doppler.  LEFT VENTRICLE PLAX 2D LVIDd:         5.00 cm  Diastology LVIDs:         4.40 cm  LV e' lateral:   5.33 cm/s LV PW:         1.30 cm  LV E/e' lateral: 28.3 LV IVS:        1.10 cm LVOT diam:     2.35 cm LV SV:         72 LV SV Index:   39 LVOT Area:     4.34 cm  LEFT ATRIUM           Index LA diam:      3.80 cm 2.08 cm/m LA Vol (A4C): 39.5 ml 21.64 ml/m  AORTIC VALVE LVOT Vmax:   90.50 cm/s LVOT Vmean:  54.400 cm/s LVOT VTI:    0.165 m  AORTA Ao Root diam: 3.60 cm MITRAL VALVE MV Area (PHT): 3.30 cm     SHUNTS MV Decel Time: 230 msec     Systemic VTI:  0.16 m MV E velocity: 151.00 cm/s  Systemic Diam: 2.35 cm Donato Schultz MD Electronically signed by Donato Schultz MD Signature Date/Time: 01/27/2020/2:13:08 PM    Final     Assessment/Plan 1. Hypotension due to drugs -reduce metoprolol to 12.5mg  po bid (though not ideal) to see if he can tolerate that dose without such profound fatigue and malaise -cont lasix   2. Essential hypertension -maintain lasix and decrease toprol - metoprolol succinate (TOPROL XL) 25 MG 24 hr tablet; Take 0.5 tablets (12.5 mg total) by mouth daily.  Dispense: 15 tablet; Refill: 11  3. Memory loss -new and in combination with his baseline decreased intellectual ability -friend, [name], helping, but his memory is declining some, as well -spent much of visit again clarifying medications and writing out meds into day  and night and separate TB meds so they can be put into morning and night pillbox by [name] after he actually picks up the full list (which was given at the last appt)--this time, I wrote on the top--Use this list  -note that just after the appt, we got a  call from Jenny Reichmann saying he'd found the list from the last visit and was going to use it and staff reminded him that I'd changed the metoprolol and he should use the current list that I wrote "use this list" on top of--he expressed understanding  4. Pulmonary tuberculosis with cavitation -continues course of therapy thru health dept who comes out Mon-Fri to be sure he takes the meds and then leaves them with him to take on the weekends  5. Tobacco use disorder -ongoing despite wellbutrin, not interested in quitting despite counseling  6. Controlled type 2 diabetes mellitus with stage 3 chronic kidney disease, without long-term current use of insulin ([place]) -very well controlled w/o meds, is on statin for cholesterol -Avoid nephrotoxic agents like nsaids, dose adjust renally excreted meds, hydrate. Lab Results  Component Value Date   HGBA1C 6.4 (H) 01/26/2020  -monitor carefully in view of diuretic use and wt loss  Labs/tests ordered:   Lab Orders  No laboratory test(s) ordered today   Next appt: 06/03/2020    Hailly Fess L. Amarilis Belflower, D.O. [facility] [address], [place] [address] Cell Phone (Mon-Fri 8am-5pm):  [phone] On Call:  [phone] & follow prompts after 5pm & weekends Office Phone:  [phone] Office Fax:  [phone]

## 2020-02-15 ENCOUNTER — Telehealth (HOSPITAL_COMMUNITY): Payer: Self-pay

## 2020-02-15 NOTE — Telephone Encounter (Signed)
Encounter complete. 

## 2020-02-15 NOTE — Telephone Encounter (Signed)
I will attempt to reach at a later time.  Encounter complete.

## 2020-02-15 NOTE — Telephone Encounter (Signed)
Pt states that information needs to be given to his friend. Pt wants [place] to call him. But patient doesn't have his number to give. Unable to confirm that patient really understands.  We will attempt to reach patient again at a later time. After patient RBV instructions he started giving instructions again but could not recall correctly.  Encounter complete.

## 2020-02-20 ENCOUNTER — Ambulatory Visit (HOSPITAL_COMMUNITY)
Admission: RE | Admit: 2020-02-20 | Discharge: 2020-02-20 | Disposition: A | Discharge status: Home / Self Care | Payer: Medicare HMO | Source: Ambulatory Visit | Attending: Cardiology | Admitting: Cardiology

## 2020-02-20 ENCOUNTER — Other Ambulatory Visit: Payer: Self-pay

## 2020-02-20 DIAGNOSIS — I429 Cardiomyopathy, unspecified: Secondary | ICD-10-CM | POA: Diagnosis not present

## 2020-02-20 LAB — MYOCARDIAL PERFUSION IMAGING
LV dias vol: 156 mL (ref 62–150)
LV sys vol: 100 mL
Peak HR: 102 {beats}/min
Rest HR: 73 {beats}/min
SDS: 1
SRS: 4
SSS: 5
TID: 0.93

## 2020-02-20 MED ORDER — REGADENOSON 0.4 MG/5ML IV SOLN
0.4000 mg | Freq: Once | INTRAVENOUS | Status: AC
  Administered 2020-02-20: 0.4 mg via INTRAVENOUS

## 2020-02-20 MED ORDER — TECHNETIUM TC 99M TETROFOSMIN IV KIT
10.4000 | PACK | Freq: Once | INTRAVENOUS | Status: AC | PRN
  Administered 2020-02-20: 10.4 via INTRAVENOUS
  Filled 2020-02-20: qty 11

## 2020-02-20 MED ORDER — TECHNETIUM TC 99M TETROFOSMIN IV KIT
29.8000 | PACK | Freq: Once | INTRAVENOUS | Status: AC | PRN
  Administered 2020-02-20: 29.8 via INTRAVENOUS
  Filled 2020-02-20: qty 30

## 2020-02-28 NOTE — Progress Notes (Signed)
Cardiology Office Note   Date:  02/29/2020   ID:  Mihir, Flanigan [birth year], MRN [id number]  PCP:  [name], DO  Cardiologist:  Dr. Ladona Ridgel     Chief Complaint  Patient presents with  . Hospitalization Follow-up    [facility]      History of Present Illness: Clarence Maldonado is a 74 y.o. male who presents for post hospitalization for acute respiratory failure with hypoxia due to new LV dysfunction   h/o COPD and stopped smoking 2 weeks ago. He was admitted with respiratory failure with desaturation. Covid negative. CT of chest negative for PE. He had very atypical chest pain. Troponins were negative. His 2D echo shows a new finding of LV dysfunction with an EF of 35%. He is referred for additional evaluation. He has hypoxemia at baseline and will need oxygen therapy at discharge. His echo showed global hypokinesis with no segmental wall motion abnormalities.   Pt placed on toprol XL 25 and ACE  Out pt stress test, tobacco use, COPD pulmonary TB on therapy.   nuc study  Defect 1: There is a medium defect of severe severity present in the basal inferior, apical anterior, apical septal, apical inferior, apical lateral and apex location  Findings suggest prior infarct.  No ischemia noted on perfusion imaging.  This study is intermediate risk given perfusion defects and ejection fraction.  Today with his friend.  No chest pain though some shoulder pain.  His PCP stopped his ACEwith cr up to 1.58 on the 30th of April now BP elevated and with decreased EF would like to try again but will do low dose. Cr up slightly will need recheck on meds. Last lipids with LDL 101 on mevacor.  Most likely will need higher dose, but hold for now.    + tobacco use.    Past Medical History:  Diagnosis Date  . Abnormal CT of the chest 2009   nodule 9 x 6 mm RUL  . Benign localized hyperplasia of prostate with urinary obstruction and other lower urinary tract symptoms (LUTS)(600.21)   .  Cardiomegaly   . Cerebrovascular accident (stroke) (HCC) 2009   lacunar infarct in the left thalamus  . Cholelithiasis   . Chronic airway obstruction, not elsewhere classified   . Chronic kidney disease, stage III (moderate)   . Closed femur fracture (HCC) 1960  . Contact with or exposure to viral disease    suspected   . Illiterate   . Obesity, unspecified   . Other and unspecified hyperlipidemia   . Special screening examination for respiratory tuberculosis   . Tobacco use disorder   . Tuberculosis   . Type II or unspecified type diabetes mellitus with renal manifestations, uncontrolled(250.42)   . Unspecified essential hypertension     Past Surgical History:  Procedure Laterality Date  . FEMUR FRACTURE SURGERY Left 1960     Current Outpatient Medications  Medication Sig Dispense Refill  . albuterol (VENTOLIN HFA) 108 (90 Base) MCG/ACT inhaler INHALE 1 TO 2 PUFFS BY MOUTH EVERY 4 TO 6 HOURS AS NEEDED FOR BREATHING 18 g 11  . aspirin EC 81 MG tablet Take 1 tablet (81 mg total) by mouth in the morning and at bedtime. 30 tablet 3  . buPROPion (WELLBUTRIN XL) 150 MG 24 hr tablet TAKE 1 TABLET(150 MG) BY MOUTH DAILY 90 tablet 1  . ferrous sulfate 325 (65 FE) MG tablet Take 1 tablet (325 mg total) by mouth 2 (two) times daily  with a meal. 60 tablet 0  . Fluticasone-Salmeterol (ADVAIR DISKUS) 250-50 MCG/DOSE AEPB Inhale 1 puff into the lungs 2 (two) times daily. 1 each 1  . furosemide (LASIX) 20 MG tablet Take 1 tablet (20 mg total) by mouth daily. 30 tablet 2  . glimepiride (AMARYL) 1 MG tablet TAKE 1 TABLET(1 MG) BY MOUTH DAILY WITH BREAKFAST 90 tablet 1  . isoniazid (NYDRAZID) 300 MG tablet Take 1 tablet (300 mg total) by mouth daily.    Marland Kitchen loratadine (CLARITIN) 10 MG tablet Take 10 mg by mouth daily.    Marland Kitchen lovastatin (MEVACOR) 20 MG tablet TAKE 1 TABLET(20 MG) BY MOUTH DAILY AT 6 PM 90 tablet 1  . pyridOXINE (B-6) 50 MG tablet Take 1 tablet (50 mg total) by mouth daily. 30 tablet 0   . rifampin (RIFADIN) 300 MG capsule Take 2 capsules (600 mg total) by mouth daily.    Marland Kitchen lisinopril (ZESTRIL) 2.5 MG tablet Take 1 tablet (2.5 mg total) by mouth daily. 90 tablet 1  . metoprolol succinate (TOPROL XL) 25 MG 24 hr tablet Take 1 tablet (25 mg total) by mouth daily. 90 tablet 1   No current facility-administered medications for this visit.    Allergies:   Patient has no known allergies.    Social History:  The patient  reports that he has quit smoking. His smoking use included cigarettes. He has a 30.00 pack-year smoking history. He has never used smokeless tobacco. He reports current alcohol use of about 1.0 standard drinks of alcohol per week. He reports that he does not use drugs.   Family History:  The patient's family history includes Heart disease in his mother.    ROS:  General:no colds or fevers, no weight changes Skin:no rashes or ulcers HEENT:no blurred vision, no congestion CV:see HPI PUL:see HPI GI:no diarrhea constipation or melena, no indigestion GU:no hematuria, no dysuria MS:no joint pain, no claudication Neuro:no syncope, no lightheadedness Endo:+ diabetes, no thyroid disease  Wt Readings from Last 3 Encounters:  02/29/20 149 lb (67.6 kg)  02/20/20 145 lb (65.8 kg)  02/12/20 134 lb (60.8 kg)     PHYSICAL EXAM: VS:  BP 140/70   Pulse 98   Ht 5\' 6"  (1.676 m)   Wt 149 lb (67.6 kg)   SpO2 97%   BMI 24.05 kg/m  , BMI Body mass index is 24.05 kg/m. General:Pleasant affect, NAD Skin:Warm and dry, brisk capillary refill HEENT:normocephalic, sclera clear, mucus membranes moist Neck:supple, no JVD, no bruits  Heart:S1S2 RRR without murmur, gallup, rub or click Lungs:clear without rales, rhonchi, or wheezes KDX:IPJA, non tender, + BS, do not palpate liver spleen or masses Ext:no lower ext edema, 2+ pedal pulses, 2+ radial pulses Neuro:alert and oriented, MAE, follows commands, + facial symmetry    EKG:  EKG is  NOT ordered today.   Recent  Labs: 05/30/2019: TSH 4.117 07/19/2019: ALT 12; Magnesium 2.1 01/26/2020: B Natriuretic Peptide 1,683.7 01/28/2020: BUN 29; Creatinine, Ser 1.58; Hemoglobin 12.8; Platelets 232; Potassium 3.6; Sodium 137    Lipid Panel    Component Value Date/Time   CHOL 148 01/26/2020 2053   CHOL 153 01/08/2016 0905   TRIG 45 01/26/2020 2053   HDL 38 (L) 01/26/2020 2053   HDL 32 (L) 01/08/2016 0905   CHOLHDL 3.9 01/26/2020 2053   VLDL 9 01/26/2020 2053   LDLCALC 101 (H) 01/26/2020 2053   LDLCALC 84 04/03/2019 2505       Other studies Reviewed: Additional studies/ records that were  reviewed today include: . Echo 01/27/20 IMPRESSIONS    1. Left ventricular ejection fraction, by estimation, is 30 to 35%. The  left ventricle has moderately decreased function. The left ventricle  demonstrates global hypokinesis. There is mild left ventricular  hypertrophy. Left ventricular diastolic  parameters are indeterminate.  2. Right ventricular systolic function is normal. The right ventricular  size is normal.  3. The mitral valve is degenerative. Mild mitral valve regurgitation. No  evidence of mitral stenosis.  4. The aortic valve is tricuspid. Aortic valve regurgitation is mild.  Mild to moderate aortic valve sclerosis/calcification is present, without  any evidence of aortic stenosis.  5. The inferior vena cava is normal in size with greater than 50%  respiratory variability, suggesting right atrial pressure of 3 mmHg.   Comparison(s): Prior images reviewed side by side. The left ventricular  function is significantly worse.   FINDINGS  Left Ventricle: Left ventricular ejection fraction, by estimation, is 30  to 35%. The left ventricle has moderately decreased function. The left  ventricle demonstrates global hypokinesis. The left ventricular internal  cavity size was normal in size.  There is mild left ventricular hypertrophy. Left ventricular diastolic  parameters are indeterminate.    Right Ventricle: The right ventricular size is normal. No increase in  right ventricular wall thickness. Right ventricular systolic function is  normal.   Left Atrium: Left atrial size was normal in size.   Right Atrium: Right atrial size was normal in size.   Pericardium: There is no evidence of pericardial effusion.   Mitral Valve: The mitral valve is degenerative in appearance. There is  mild thickening of the mitral valve leaflet(s). There is mild  calcification of the mitral valve leaflet(s). Normal mobility of the  mitral valve leaflets. Severe mitral annular  calcification. Mild mitral valve regurgitation. No evidence of mitral  valve stenosis.   Tricuspid Valve: The tricuspid valve is normal in structure. Tricuspid  valve regurgitation is not demonstrated. No evidence of tricuspid  stenosis.   Aortic Valve: The aortic valve is tricuspid. Aortic valve regurgitation is  mild. Mild to moderate aortic valve sclerosis/calcification is present,  without any evidence of aortic stenosis.   Pulmonic Valve: The pulmonic valve was normal in structure. Pulmonic valve  regurgitation is not visualized. No evidence of pulmonic stenosis.   Aorta: The aortic root is normal in size and structure.   Venous: The inferior vena cava is normal in size with greater than 50%  respiratory variability, suggesting right atrial pressure of 3 mmHg.   NUC study 02/20/20 Study Highlights    The left ventricular ejection fraction is moderately decreased (30-44%).  Nuclear stress EF: 35%.  There was no ST segment deviation noted during stress.  Defect 1: There is a medium defect of severe severity present in the basal inferior, apical anterior, apical septal, apical inferior, apical lateral and apex location.  Findings consistent with prior myocardial infarction.  This is an intermediate risk study.   There is a medium size fixed perfusion defect in the apical cap (segment 17), apex, and  basal inferior wall.  Perfusion is absent in the apical cap and inferoapex and is fixed.  Severe fixed perfusion defect in the anteroapex, lateral apex and apical septum.  The mid ventricle has overall preserved perfusion.  There is a severe fixed perfusion defect in the basal inferior wall.  Global hypokinesis with ejection fraction 35%.  Findings suggest prior infarct.  No ischemia noted on perfusion imaging.  This study  is intermediate risk given perfusion defects and ejection fraction.      ASSESSMENT AND PLAN:  1.  Cardiomyopathy on BB off ACE with climbing Cr but will resume at low dose.  On 12.5 of toprol will increase to 25 mg daily  Neg ischemia on nuc study but scar with no known hx of MI.  Will defer to Dr. Ladona Ridgel -may be beneficial to do cath to eval cardiac disease.    2.  Renal insuff will add back ACE at lower dose for the cardiomyopathy, to follow-up with dr. Ladona Ridgel on the 9th.  3.  TOBACCO USE  Discussed need to stop, but he was not interested  4.  HLD with hx of scar on stress test on mevacor.    5.  Pt does not read or write, his old boss came with him and helps with his meds.    6.  abnormal nuc study with hx MI though none documented.  Will defer to Dr. Ladona Ridgel for cath.  Current medicines are reviewed with the patient today.  The patient Has no concerns regarding medicines.  The following changes have been made:  See above Labs/ tests ordered today include:see above  Disposition:   FU:  see above  Signed, Nada Boozer, NP  02/29/2020 10:25 PM    [facility] [address] [place], [place], [place]  [address]/ [address] [place], [place] Phone: [phone]; Fax: [phone]  [phone]

## 2020-02-29 ENCOUNTER — Other Ambulatory Visit: Payer: Self-pay

## 2020-02-29 ENCOUNTER — Encounter: Payer: Self-pay | Admitting: Cardiology

## 2020-02-29 ENCOUNTER — Ambulatory Visit (INDEPENDENT_AMBULATORY_CARE_PROVIDER_SITE_OTHER): Payer: Medicare HMO | Admitting: Cardiology

## 2020-02-29 ENCOUNTER — Encounter (INDEPENDENT_AMBULATORY_CARE_PROVIDER_SITE_OTHER): Payer: Self-pay

## 2020-02-29 VITALS — BP 140/70 | HR 98 | Ht 66.0 in | Wt 149.0 lb

## 2020-02-29 DIAGNOSIS — R9439 Abnormal result of other cardiovascular function study: Secondary | ICD-10-CM | POA: Diagnosis not present

## 2020-02-29 DIAGNOSIS — I10 Essential (primary) hypertension: Secondary | ICD-10-CM | POA: Diagnosis not present

## 2020-02-29 DIAGNOSIS — I429 Cardiomyopathy, unspecified: Secondary | ICD-10-CM | POA: Diagnosis not present

## 2020-02-29 DIAGNOSIS — Z720 Tobacco use: Secondary | ICD-10-CM | POA: Diagnosis not present

## 2020-02-29 DIAGNOSIS — E782 Mixed hyperlipidemia: Secondary | ICD-10-CM

## 2020-02-29 DIAGNOSIS — N289 Disorder of kidney and ureter, unspecified: Secondary | ICD-10-CM | POA: Diagnosis not present

## 2020-02-29 MED ORDER — LISINOPRIL 2.5 MG PO TABS
2.5000 mg | ORAL_TABLET | Freq: Every day | ORAL | 1 refills | Status: DC
Start: 2020-02-29 — End: 2020-06-13

## 2020-02-29 MED ORDER — METOPROLOL SUCCINATE ER 25 MG PO TB24
25.0000 mg | ORAL_TABLET | Freq: Every day | ORAL | 1 refills | Status: DC
Start: 2020-02-29 — End: 2020-06-13

## 2020-02-29 NOTE — Patient Instructions (Signed)
Medication Instructions:  Your physician has recommended you make the following change in your medication:  1.  START Lisinopril 2.5 mg taking 1 daily 2.  INCREASE the Toprol Xl 25 mg to 1 tablet daily   *If you need a refill on your cardiac medications before your next appointment, please call your pharmacy*   Lab Work: None ordered  If you have labs (blood work) drawn today and your tests are completely normal, you will receive your results only by: Marland Kitchen MyChart Message (if you have MyChart) OR . A paper copy in the mail If you have any lab test that is abnormal or we need to change your treatment, we will call you to review the results.   Testing/Procedures: None ordered   Follow-Up: At [facility], you and your health needs are our priority.  As part of our continuing mission to provide you with exceptional heart care, we have created designated Provider Care Teams.  These Care Teams include your primary Cardiologist (physician) and Advanced Practice Providers (APPs -  Physician Assistants and Nurse Practitioners) who all work together to provide you with the care you need, when you need it.  We recommend signing up for the patient portal called "MyChart".  Sign up information is provided on this After Visit Summary.  MyChart is used to connect with patients for Virtual Visits (Telemedicine).  Patients are able to view lab/test results, encounter notes, upcoming appointments, etc.  Non-urgent messages can be sent to your provider as well.   To learn more about what you can do with MyChart, go to [email or web address].    Your next appointment:   Keep your follow-up appointment 03/13/20   The format for your next appointment:   In Person  Provider:   Lewayne Bunting, MD   Other Instructions

## 2020-03-05 ENCOUNTER — Other Ambulatory Visit: Payer: Self-pay | Admitting: Internal Medicine

## 2020-03-05 DIAGNOSIS — J449 Chronic obstructive pulmonary disease, unspecified: Secondary | ICD-10-CM | POA: Diagnosis not present

## 2020-03-05 NOTE — Telephone Encounter (Signed)
rx sent to pharmacy by e-script  

## 2020-03-13 ENCOUNTER — Other Ambulatory Visit: Payer: Self-pay

## 2020-03-13 ENCOUNTER — Ambulatory Visit (INDEPENDENT_AMBULATORY_CARE_PROVIDER_SITE_OTHER): Payer: Medicare HMO | Admitting: Internal Medicine

## 2020-03-13 ENCOUNTER — Encounter: Payer: Self-pay | Admitting: Internal Medicine

## 2020-03-13 DIAGNOSIS — I428 Other cardiomyopathies: Secondary | ICD-10-CM

## 2020-03-13 NOTE — Progress Notes (Signed)
HPI [name] returns today to followup his 2D echo and stress test. He is a pleasant 74 yo man with multiple medical problems including a 100 lb weight loss, pulmonary TB, s/p treatment which is still ongoing, as well as tobacco abuse. He had a silent MI in the past and his stress test showed an anterior scar as well as an inferior scar with an EF of 35% and no ischemia. He does not have chest pain and he has class 2 dyspnea which is multifactorial. He denies syncope. He has COPD.  No Known Allergies   Current Outpatient Medications  Medication Sig Dispense Refill  . albuterol (VENTOLIN HFA) 108 (90 Base) MCG/ACT inhaler INHALE 1 TO 2 PUFFS BY MOUTH EVERY 4 TO 6 HOURS AS NEEDED FOR BREATHING 18 g 11  . aspirin EC 81 MG tablet Take 1 tablet (81 mg total) by mouth in the morning and at bedtime. 30 tablet 3  . buPROPion (WELLBUTRIN XL) 150 MG 24 hr tablet TAKE 1 TABLET(150 MG) BY MOUTH DAILY 90 tablet 1  . Ferrous Sulfate (IRON) 325 (65 Fe) MG TABS TAKE 1 TABLET(325 MG) BY MOUTH TWICE DAILY WITH A MEAL 60 tablet 0  . Fluticasone-Salmeterol (ADVAIR DISKUS) 250-50 MCG/DOSE AEPB Inhale 1 puff into the lungs 2 (two) times daily. 1 each 1  . furosemide (LASIX) 20 MG tablet Take 1 tablet (20 mg total) by mouth daily. 30 tablet 2  . glimepiride (AMARYL) 1 MG tablet TAKE 1 TABLET(1 MG) BY MOUTH DAILY WITH BREAKFAST 90 tablet 1  . isoniazid (NYDRAZID) 300 MG tablet Take 1 tablet (300 mg total) by mouth daily.    Marland Kitchen lisinopril (ZESTRIL) 2.5 MG tablet Take 1 tablet (2.5 mg total) by mouth daily. 90 tablet 1  . loratadine (CLARITIN) 10 MG tablet Take 10 mg by mouth daily.    Marland Kitchen lovastatin (MEVACOR) 20 MG tablet TAKE 1 TABLET(20 MG) BY MOUTH DAILY AT 6 PM 90 tablet 1  . metoprolol succinate (TOPROL XL) 25 MG 24 hr tablet Take 1 tablet (25 mg total) by mouth daily. 90 tablet 1  . pyridOXINE (B-6) 50 MG tablet Take 1 tablet (50 mg total) by mouth daily. 30 tablet 0  . rifampin (RIFADIN) 300 MG capsule  Take 2 capsules (600 mg total) by mouth daily.     No current facility-administered medications for this visit.     Past Medical History:  Diagnosis Date  . Abnormal CT of the chest 2009   nodule 9 x 6 mm RUL  . Benign localized hyperplasia of prostate with urinary obstruction and other lower urinary tract symptoms (LUTS)(600.21)   . Cardiomegaly   . Cerebrovascular accident (stroke) (HCC) 2009   lacunar infarct in the left thalamus  . Cholelithiasis   . Chronic airway obstruction, not elsewhere classified   . Chronic kidney disease, stage III (moderate)   . Closed femur fracture (HCC) 1960  . Contact with or exposure to viral disease    suspected   . Illiterate   . Obesity, unspecified   . Other and unspecified hyperlipidemia   . Special screening examination for respiratory tuberculosis   . Tobacco use disorder   . Tuberculosis   . Type II or unspecified type diabetes mellitus with renal manifestations, uncontrolled(250.42)   . Unspecified essential hypertension     ROS:   All systems reviewed and negative except as noted in the HPI.   Past Surgical History:  Procedure Laterality Date  . FEMUR  FRACTURE SURGERY Left 1960     Family History  Problem Relation Age of Onset  . Heart disease Mother      Social History   Socioeconomic History  . Marital status: Single    Spouse name: Not on file  . Number of children: Not on file  . Years of education: Not on file  . Highest education level: Not on file  Occupational History  . Not on file  Tobacco Use  . Smoking status: Former Smoker    Packs/day: 0.50    Years: 60.00    Pack years: 30.00    Types: Cigarettes  . Smokeless tobacco: Never Used  . Tobacco comment: quit smoking 3 month days   Substance and Sexual Activity  . Alcohol use: Yes    Alcohol/week: 1.0 standard drinks    Types: 1 Cans of beer per week  . Drug use: No  . Sexual activity: Not on file  Other Topics Concern  . Not on file  Social  History Narrative  . Not on file   Social Determinants of Health   Financial Resource Strain:   . Difficulty of Paying Living Expenses:   Food Insecurity:   . Worried About Charity fundraiser in the Last Year:   . Arboriculturist in the Last Year:   Transportation Needs:   . Film/video editor (Medical):   Marland Kitchen Lack of Transportation (Non-Medical):   Physical Activity:   . Days of Exercise per Week:   . Minutes of Exercise per Session:   Stress:   . Feeling of Stress :   Social Connections:   . Frequency of Communication with Friends and Family:   . Frequency of Social Gatherings with Friends and Family:   . Attends Religious Services:   . Active Member of Clubs or Organizations:   . Attends Archivist Meetings:   Marland Kitchen Marital Status:   Intimate Partner Violence:   . Fear of Current or Ex-Partner:   . Emotionally Abused:   Marland Kitchen Physically Abused:   . Sexually Abused:      Ht 5\' 6"  (1.676 m)   BMI 24.05 kg/m   Physical Exam:  Well appearing NAD HEENT: Unremarkable Neck:  No JVD, no thyromegally Lymphatics:  No adenopathy Back:  No CVA tenderness Lungs:  Clear HEART:  Regular rate rhythm, no murmurs, no rubs, no clicks Abd:  soft, positive bowel sounds, no organomegally, no rebound, no guarding Ext:  2 plus pulses, no edema, no cyanosis, no clubbing Skin:  No rashes no nodules Neuro:  CN II through XII intact, motor grossly intact  EKG - nsr with anterolateral Tw abnormality.   Assess/Plan: 1. Chronic systolic heart failure - he has class 2 symptoms. He is on maximal medical therapy. He is not a candidate for ICD insertion.  2. TB - he is still undergoing treatment wit rifampin. He will continue. 3. CAD - he has had silent MI's. He is a poor candidate for revascularization and he is asymptomatic. I think that the risk/benefits weigh against invasive eval. 4. Tobacco abuse - he stopped smoking for almost a year but has started back. I encouraged him to  stop smoking.  [email or web address] [email or web address].D.

## 2020-03-13 NOTE — Patient Instructions (Addendum)
Medication Instructions:  Your physician recommends that you continue on your current medications as directed. Please refer to the Current Medication list given to you today.  STOP SMOKING  Labwork: None ordered.  Testing/Procedures: None ordered.  Follow-Up: Your physician wants you to follow-up in: 4 months with Dr. Ladona Ridgel.     July 16, 2020 at 10:30 am at the [facility] office  Any Other Special Instructions Will Be Listed Below (If Applicable).  If you need a refill on your cardiac medications before your next appointment, please call your pharmacy.

## 2020-04-04 DIAGNOSIS — J449 Chronic obstructive pulmonary disease, unspecified: Secondary | ICD-10-CM | POA: Diagnosis not present

## 2020-04-15 ENCOUNTER — Telehealth: Payer: Self-pay

## 2020-04-15 MED ORDER — FUROSEMIDE 20 MG PO TABS: 20.0000 mg | ORAL_TABLET | Freq: Every day | ORAL | 2 refills | Status: DC

## 2020-04-15 NOTE — Telephone Encounter (Signed)
Patient Contact "Clarence Maldonado" called and states that patient needs a refill on "Furosemide 20mg ". Patient contact states that only 2 pills are left for patient. Patient last had a refill from , Thad Ranger, MD on 01/28/2020 with 2 refills. Medication "Ok" to refill per PCP Dr.Reed.

## 2020-04-17 DIAGNOSIS — Z23 Encounter for immunization: Secondary | ICD-10-CM | POA: Diagnosis not present

## 2020-05-05 DIAGNOSIS — J449 Chronic obstructive pulmonary disease, unspecified: Secondary | ICD-10-CM | POA: Diagnosis not present

## 2020-05-17 ENCOUNTER — Other Ambulatory Visit: Payer: Self-pay | Admitting: Internal Medicine

## 2020-05-17 DIAGNOSIS — N183 Chronic kidney disease, stage 3 unspecified: Secondary | ICD-10-CM

## 2020-05-17 DIAGNOSIS — E1122 Type 2 diabetes mellitus with diabetic chronic kidney disease: Secondary | ICD-10-CM

## 2020-06-03 ENCOUNTER — Other Ambulatory Visit: Payer: Medicare (Managed Care)

## 2020-06-03 ENCOUNTER — Other Ambulatory Visit: Payer: Medicare HMO

## 2020-06-03 ENCOUNTER — Ambulatory Visit: Payer: Medicare (Managed Care) | Admitting: Internal Medicine

## 2020-06-05 DIAGNOSIS — J449 Chronic obstructive pulmonary disease, unspecified: Secondary | ICD-10-CM | POA: Diagnosis not present

## 2020-06-13 ENCOUNTER — Ambulatory Visit (INDEPENDENT_AMBULATORY_CARE_PROVIDER_SITE_OTHER): Payer: Medicare HMO | Admitting: Internal Medicine

## 2020-06-13 ENCOUNTER — Other Ambulatory Visit: Payer: Medicare HMO

## 2020-06-13 ENCOUNTER — Other Ambulatory Visit: Payer: Self-pay

## 2020-06-13 ENCOUNTER — Encounter: Payer: Self-pay | Admitting: Internal Medicine

## 2020-06-13 VITALS — BP 138/82 | HR 93 | Temp 97.8°F | Ht 66.0 in | Wt 157.0 lb

## 2020-06-13 DIAGNOSIS — A150 Tuberculosis of lung: Secondary | ICD-10-CM

## 2020-06-13 DIAGNOSIS — Z1159 Encounter for screening for other viral diseases: Secondary | ICD-10-CM | POA: Diagnosis not present

## 2020-06-13 DIAGNOSIS — Z9189 Other specified personal risk factors, not elsewhere classified: Secondary | ICD-10-CM

## 2020-06-13 DIAGNOSIS — N1832 Chronic kidney disease, stage 3b: Secondary | ICD-10-CM | POA: Diagnosis not present

## 2020-06-13 DIAGNOSIS — F17200 Nicotine dependence, unspecified, uncomplicated: Secondary | ICD-10-CM | POA: Diagnosis not present

## 2020-06-13 DIAGNOSIS — Z23 Encounter for immunization: Secondary | ICD-10-CM

## 2020-06-13 DIAGNOSIS — E1122 Type 2 diabetes mellitus with diabetic chronic kidney disease: Secondary | ICD-10-CM

## 2020-06-13 DIAGNOSIS — Z716 Tobacco abuse counseling: Secondary | ICD-10-CM

## 2020-06-13 DIAGNOSIS — E785 Hyperlipidemia, unspecified: Secondary | ICD-10-CM | POA: Diagnosis not present

## 2020-06-13 DIAGNOSIS — E1169 Type 2 diabetes mellitus with other specified complication: Secondary | ICD-10-CM | POA: Diagnosis not present

## 2020-06-13 DIAGNOSIS — N183 Chronic kidney disease, stage 3 unspecified: Secondary | ICD-10-CM | POA: Diagnosis not present

## 2020-06-13 DIAGNOSIS — I10 Essential (primary) hypertension: Secondary | ICD-10-CM | POA: Diagnosis not present

## 2020-06-13 MED ORDER — METOPROLOL SUCCINATE ER 25 MG PO TB24
25.0000 mg | ORAL_TABLET | Freq: Every day | ORAL | 1 refills | Status: DC
Start: 2020-06-13 — End: 2021-07-10

## 2020-06-13 MED ORDER — ALBUTEROL SULFATE HFA 108 (90 BASE) MCG/ACT IN AERS: INHALATION_SPRAY | RESPIRATORY_TRACT | 11 refills | Status: DC

## 2020-06-13 MED ORDER — FLUTICASONE-SALMETEROL 250-50 MCG/DOSE IN AEPB: 1.0000 | INHALATION_SPRAY | Freq: Two times a day (BID) | RESPIRATORY_TRACT | 1 refills | Status: DC

## 2020-06-13 MED ORDER — GLIMEPIRIDE 1 MG PO TABS: 1.0000 mg | ORAL_TABLET | Freq: Every day | ORAL | 1 refills | Status: DC

## 2020-06-13 MED ORDER — LISINOPRIL 2.5 MG PO TABS: 2.5000 mg | ORAL_TABLET | Freq: Every day | ORAL | 1 refills | Status: DC

## 2020-06-13 MED ORDER — IRON 325 (65 FE) MG PO TABS: ORAL_TABLET | ORAL | 0 refills | Status: DC

## 2020-06-13 MED ORDER — BUPROPION HCL ER (XL) 150 MG PO TB24: ORAL_TABLET | ORAL | 1 refills | Status: DC

## 2020-06-13 NOTE — Progress Notes (Signed)
Location:  [facility] clinic Provider:  Calyx Hawker L. Renato Gails, D.O., C.M.D.  Goals of Care:  Advanced Directives 06/13/2020  Does Patient Have a Medical Advance Directive? No  Does patient want to make changes to medical advance directive? -  Would patient like information on creating a medical advance directive? No - Patient declined     Chief Complaint  Patient presents with  . Medical Management of Chronic Issues    4 month follow   . Health Maintenance    eye exam, colonoscopy, urine microalbumin, foot exam  and influenza    HPI: Patient is a 74 y.o. male seen today for medical management of chronic diseases.    He feels fine.  Breathing is a little short if he does any exertion.  Not coughing too much, but [name] disagrees.  Denies sputum production.  Smokes a cigarette every once in a while.  Counseled on this with his breathing.  He has more pep than when he left the hospital for his TB.    He wishes he had more energy--like it he is going to cut grass.  He does not walk at 6:30-7am as recommended by cardiology.  Counseled on this.  If he lays down after exerting himself--30 mins later he reports being good.    He took turmeric and black pepper for joint pain.  Reports none now.    He's up 8 lbs in the past three months which is actually good b/c he kept losing and losing.  He only eats one good meal per day.  After I said to eat small amounts a few times a day, he says he does this instead.    Agrees to urine micro test.  Foot exam done.  Still has not had eye exam and claims to see fine.    Agrees to flu shot.    Declines colonoscopy/cologuard.    Past Medical History:  Diagnosis Date  . Abnormal CT of the chest 2009   nodule 9 x 6 mm RUL  . Benign localized hyperplasia of prostate with urinary obstruction and other lower urinary tract symptoms (LUTS)(600.21)   . Cardiomegaly   . Cerebrovascular accident (stroke) (HCC) 2009   lacunar infarct in the left thalamus  .  Cholelithiasis   . Chronic airway obstruction, not elsewhere classified   . Chronic kidney disease, stage III (moderate)   . Closed femur fracture (HCC) 1960  . Contact with or exposure to viral disease    suspected   . Illiterate   . Obesity, unspecified   . Other and unspecified hyperlipidemia   . Special screening examination for respiratory tuberculosis   . Tobacco use disorder   . Tuberculosis   . Type II or unspecified type diabetes mellitus with renal manifestations, uncontrolled(250.42)   . Unspecified essential hypertension     Past Surgical History:  Procedure Laterality Date  . FEMUR FRACTURE SURGERY Left 1960    No Known Allergies  Outpatient Encounter Medications as of 06/13/2020  Medication Sig  . albuterol (VENTOLIN HFA) 108 (90 Base) MCG/ACT inhaler INHALE 1 TO 2 PUFFS BY MOUTH EVERY 4 TO 6 HOURS AS NEEDED FOR BREATHING  . aspirin EC 81 MG tablet Take 1 tablet (81 mg total) by mouth in the morning and at bedtime.  Marland Kitchen buPROPion (WELLBUTRIN XL) 150 MG 24 hr tablet TAKE 1 TABLET(150 MG) BY MOUTH DAILY  . Ferrous Sulfate (IRON) 325 (65 Fe) MG TABS TAKE 1 TABLET(325 MG) BY MOUTH TWICE DAILY WITH A MEAL  .  Fluticasone-Salmeterol (ADVAIR DISKUS) 250-50 MCG/DOSE AEPB Inhale 1 puff into the lungs 2 (two) times daily.  . furosemide (LASIX) 20 MG tablet Take 1 tablet (20 mg total) by mouth daily.  Marland Kitchen glimepiride (AMARYL) 1 MG tablet Take 1 tablet (1 mg total) by mouth daily with breakfast.  . isoniazid (NYDRAZID) 300 MG tablet Take 1 tablet (300 mg total) by mouth daily.  Marland Kitchen lisinopril (ZESTRIL) 2.5 MG tablet Take 1 tablet (2.5 mg total) by mouth daily.  Marland Kitchen loratadine (CLARITIN) 10 MG tablet Take 10 mg by mouth daily.  Marland Kitchen lovastatin (MEVACOR) 20 MG tablet TAKE 1 TABLET(20 MG) BY MOUTH DAILY AT 6 PM  . metoprolol succinate (TOPROL XL) 25 MG 24 hr tablet Take 1 tablet (25 mg total) by mouth daily.  Marland Kitchen pyridOXINE (B-6) 50 MG tablet Take 1 tablet (50 mg total) by mouth daily.  .  rifampin (RIFADIN) 300 MG capsule Take 2 capsules (600 mg total) by mouth daily.  . [DISCONTINUED] albuterol (VENTOLIN HFA) 108 (90 Base) MCG/ACT inhaler INHALE 1 TO 2 PUFFS BY MOUTH EVERY 4 TO 6 HOURS AS NEEDED FOR BREATHING  . [DISCONTINUED] buPROPion (WELLBUTRIN XL) 150 MG 24 hr tablet TAKE 1 TABLET(150 MG) BY MOUTH DAILY  . [DISCONTINUED] Ferrous Sulfate (IRON) 325 (65 Fe) MG TABS TAKE 1 TABLET(325 MG) BY MOUTH TWICE DAILY WITH A MEAL  . [DISCONTINUED] Fluticasone-Salmeterol (ADVAIR DISKUS) 250-50 MCG/DOSE AEPB Inhale 1 puff into the lungs 2 (two) times daily.  . [DISCONTINUED] glimepiride (AMARYL) 1 MG tablet TAKE 1 TABLET(1 MG) BY MOUTH DAILY WITH BREAKFAST  . [DISCONTINUED] lisinopril (ZESTRIL) 2.5 MG tablet Take 1 tablet (2.5 mg total) by mouth daily.  . [DISCONTINUED] metoprolol succinate (TOPROL XL) 25 MG 24 hr tablet Take 1 tablet (25 mg total) by mouth daily.   No facility-administered encounter medications on file as of 06/13/2020.    Review of Systems:  Review of Systems  Constitutional: Positive for malaise/fatigue. Negative for chills and fever.  HENT: Negative for congestion and sore throat.   Eyes: Negative for blurred vision.  Respiratory: Positive for cough, sputum production and shortness of breath. Negative for wheezing.   Cardiovascular: Negative for chest pain, palpitations and leg swelling.  Gastrointestinal: Negative for abdominal pain, blood in stool, constipation, diarrhea and melena.  Genitourinary: Negative for dysuria.  Musculoskeletal: Negative for back pain, falls and joint pain.  Skin: Negative for itching and rash.  Neurological: Positive for sensory change. Negative for dizziness and loss of consciousness.  Endo/Heme/Allergies: Bruises/bleeds easily.  Psychiatric/Behavioral: Positive for memory loss. Negative for depression. The patient is not nervous/anxious and does not have insomnia.     Health Maintenance  Topic Date Due  . Hepatitis C Screening   Never done  . OPHTHALMOLOGY EXAM  Never done  . COLONOSCOPY  Never done  . FOOT EXAM  03/22/2019  . INFLUENZA VACCINE  05/05/2020  . HEMOGLOBIN A1C  07/27/2020  . TETANUS/TDAP  01/10/2022  . COVID-19 Vaccine  Completed  . PNA vac Low Risk Adult  Completed    Physical Exam: Vitals:   06/13/20 1121  BP: 138/82  Pulse: 93  Temp: 97.8 F (36.6 C)  TempSrc: Temporal  SpO2: 96%  Weight: 157 lb (71.2 kg)  Height: 5\' 6"  (1.676 m)   Body mass index is 25.34 kg/m. Physical Exam Vitals reviewed.  Constitutional:      General: He is not in acute distress.    Appearance: Normal appearance. He is not toxic-appearing.  HENT:  Head: Normocephalic and atraumatic.  Eyes:     Conjunctiva/sclera: Conjunctivae normal.  Cardiovascular:     Rate and Rhythm: Normal rate and regular rhythm.     Pulses: Normal pulses.     Heart sounds: Normal heart sounds.  Pulmonary:     Effort: Pulmonary effort is normal.     Breath sounds: Wheezing and rhonchi present.  Abdominal:     General: Bowel sounds are normal.     Palpations: Abdomen is soft.  Musculoskeletal:        General: Normal range of motion.     Cervical back: Neck supple.     Right lower leg: No edema.     Left lower leg: No edema.  Lymphadenopathy:     Cervical: No cervical adenopathy.  Skin:    General: Skin is warm and dry.  Neurological:     General: No focal deficit present.     Mental Status: He is alert and oriented to person, place, and time.     Sensory: Sensory deficit present.     Gait: Gait normal.     Comments: Some short-term memory loss plus baseline intellectual disability and illiterate  Psychiatric:        Mood and Affect: Mood normal.     Labs reviewed: Basic Metabolic Panel: Recent Labs    06/21/19 0450 06/22/19 0359 07/15/19 0520 07/15/19 0520 07/19/19 0511 01/26/20 1500 01/28/20 0505  NA 134*   < > 134*   < > 134* 142 137  K 3.7   < > 4.1   < > 3.9 3.9 3.6  CL 106   < > 100   < > 101 103  99  CO2 21*   < > 22   < > 23 28 26   GLUCOSE 89   < > 103*   < > 99 133* 57*  BUN 31*   < > 41*   < > 35* 27* 29*  CREATININE 1.45*   < > 1.32*   < > 1.29* 1.43* 1.58*  CALCIUM 8.6*   < > 9.7   < > 9.1 9.4 9.2  MG 2.0  --  2.3  --  2.1  --   --   PHOS  --   --  5.0*  --  3.8  --   --    < > = values in this interval not displayed.   Liver Function Tests: Recent Labs    07/12/19 0431 07/15/19 0520 07/19/19 0511  AST 18 20 17   ALT 9 12 12   ALKPHOS 66 68 68  BILITOT 0.4 0.8 0.7  PROT 6.4* 6.9 6.5  ALBUMIN 2.8* 3.2* 2.9*   No results for input(s): LIPASE, AMYLASE in the last 8760 hours. No results for input(s): AMMONIA in the last 8760 hours. CBC: Recent Labs    06/17/19 1010 06/19/19 0530 07/15/19 0520 07/15/19 0520 07/19/19 0511 07/19/19 0511 01/26/20 1500 01/27/20 0412 01/28/20 0505  WBC 6.9   < > 6.5   < > 7.0   < > 6.7 6.3 5.7  NEUTROABS 4.1  --  4.1  --  4.3  --   --   --   --   HGB 11.2*   < > 12.9*   < > 12.9*   < > 12.8* 11.8* 12.8*  HCT 38.4*   < > 41.2   < > 41.6   < > 41.5 38.5* 42.0  MCV 87.1   < > 85.7   < > 84.6   < >  83.3 85.4 84.5  PLT 285   < > 230   < > 204   < > 217 196 232   < > = values in this interval not displayed.   Lipid Panel: Recent Labs    01/26/20 2053  CHOL 148  HDL 38*  LDLCALC 101*  TRIG 45  CHOLHDL 3.9   Lab Results  Component Value Date   HGBA1C 6.4 (H) 01/26/2020     Assessment/Plan 1. Tobacco use disorder -counseled once again on smoking cessation for more than 3 mins, but he has no motivation to quit - albuterol (VENTOLIN HFA) 108 (90 Base) MCG/ACT inhaler; INHALE 1 TO 2 PUFFS BY MOUTH EVERY 4 TO 6 HOURS AS NEEDED FOR BREATHING  Dispense: 18 g; Refill: 11 - given buPROPion (WELLBUTRIN XL) 150 MG 24 hr tablet; TAKE 1 TABLET(150 MG) BY MOUTH DAILY  Dispense: 90 tablet; Refill: 1 to help decrease desire to smoke  2. Encounter for smoking cessation counseling -counseling provided and wellbutrin to help with  cessation - buPROPion (WELLBUTRIN XL) 150 MG 24 hr tablet; TAKE 1 TABLET(150 MG) BY MOUTH DAILY  Dispense: 90 tablet; Refill: 1  3. Controlled type 2 diabetes mellitus with stage 3 chronic kidney disease, without long-term current use of insulin (HCC) -excellent control, but eating habits are poor--counseled on importance of regular meals, avoiding fried foods, regular walking for exercise Lab Results  Component Value Date   HGBA1C 5.5 06/13/2020   - glimepiride (AMARYL) 1 MG tablet; Take 1 tablet (1 mg total) by mouth daily with breakfast.  Dispense: 90 tablet; Refill: 1  4. Need for influenza vaccination - Flu Vaccine QUAD High Dose(Fluad) given  5.  Pulmonary TB -continues on health dept-regulated treatment for his TB   6.  Need for hep C screen in high risk pt -hep c screen ordered with routine labs  Labs/tests ordered:   Lab Orders     Microalbumin / creatinine urine ratio     Hepatitis C antibody  Next appt: 4 mos with fasting labs before   Abagail Limb L. Mariely Mahr, D.O. Geriatrics [facility] [address] [place], [place] [address] Cell Phone (Mon-Fri 8am-5pm):  [phone] On Call:  [phone] & follow prompts after 5pm & weekends Office Phone:  [phone] Office Fax:  [phone]

## 2020-06-13 NOTE — Patient Instructions (Addendum)
Maintain your weight where it is.  Try to walk more.

## 2020-06-14 ENCOUNTER — Other Ambulatory Visit: Payer: Self-pay | Admitting: Internal Medicine

## 2020-06-14 LAB — COMPLETE METABOLIC PANEL WITH GFR
AG Ratio: 1.3 (calc) (ref 1.0–2.5)
ALT: 9 U/L (ref 9–46)
AST: 20 U/L (ref 10–35)
Albumin: 4 g/dL (ref 3.6–5.1)
Alkaline phosphatase (APISO): 99 U/L (ref 35–144)
BUN/Creatinine Ratio: 12 (calc) (ref 6–22)
BUN: 18 mg/dL (ref 7–25)
CO2: 25 mmol/L (ref 20–32)
Calcium: 8.9 mg/dL (ref 8.6–10.3)
Chloride: 106 mmol/L (ref 98–110)
Creat: 1.48 mg/dL — ABNORMAL HIGH (ref 0.70–1.18)
GFR, Est African American: 54 mL/min/{1.73_m2} — ABNORMAL LOW (ref >=60)
GFR, Est Non African American: 46 mL/min/{1.73_m2} — ABNORMAL LOW (ref >=60)
Globulin: 3.1 g/dL (calc) (ref 1.9–3.7)
Glucose, Bld: 75 mg/dL (ref 65–99)
Potassium: 4.2 mmol/L (ref 3.5–5.3)
Sodium: 139 mmol/L (ref 135–146)
Total Bilirubin: 0.4 mg/dL (ref 0.2–1.2)
Total Protein: 7.1 g/dL (ref 6.1–8.1)

## 2020-06-14 LAB — LIPID PANEL
Cholesterol: 166 mg/dL (ref <200)
HDL: 54 mg/dL (ref >=40)
LDL Cholesterol (Calc): 92 mg/dL (calc)
Non-HDL Cholesterol (Calc): 112 mg/dL (calc) (ref <130)
Total CHOL/HDL Ratio: 3.1 (calc) (ref <5.0)
Triglycerides: 102 mg/dL (ref <150)

## 2020-06-14 LAB — CBC WITH DIFFERENTIAL/PLATELET
Absolute Monocytes: 732 cells/uL (ref 200–950)
Basophils Absolute: 62 cells/uL (ref 0–200)
Basophils Relative: 0.8 %
Eosinophils Absolute: 270 cells/uL (ref 15–500)
Eosinophils Relative: 3.5 %
HCT: 49.6 % (ref 38.5–50.0)
Hemoglobin: 16.3 g/dL (ref 13.2–17.1)
Lymphs Abs: 2202 cells/uL (ref 850–3900)
MCH: 29.3 pg (ref 27.0–33.0)
MCHC: 32.9 g/dL (ref 32.0–36.0)
MCV: 89 fL (ref 80.0–100.0)
MPV: 9.6 fL (ref 7.5–12.5)
Monocytes Relative: 9.5 %
Neutro Abs: 4435 cells/uL (ref 1500–7800)
Neutrophils Relative %: 57.6 %
Platelets: 188 10*3/uL (ref 140–400)
RBC: 5.57 10*6/uL (ref 4.20–5.80)
RDW: 14.8 % (ref 11.0–15.0)
Total Lymphocyte: 28.6 %
WBC: 7.7 10*3/uL (ref 3.8–10.8)

## 2020-06-14 LAB — HEMOGLOBIN A1C
Hgb A1c MFr Bld: 5.5 % of total Hgb (ref <5.7)
Mean Plasma Glucose: 111 (calc)
eAG (mmol/L): 6.2 (calc)

## 2020-06-14 LAB — MICROALBUMIN / CREATININE URINE RATIO
Creatinine, Urine: 116 mg/dL (ref 20–320)
Microalb Creat Ratio: 182 mcg/mg creat — ABNORMAL HIGH (ref <30)
Microalb, Ur: 21.1 mg/dL

## 2020-06-14 LAB — HEPATITIS C ANTIBODY
Hepatitis C Ab: NONREACTIVE
SIGNAL TO CUT-OFF: 0.12 (ref <1.00)

## 2020-06-14 NOTE — Progress Notes (Signed)
Please notify [name] by phone:  Tyress's sugar has improved to a normal range. Bad cholesterol has increased since last time--he's already on lovastatin 20mg  so I recommend he reduce his intake of fried food to help with this.   Kidney function is stable. Electrolytes, liver, and blood counts are in normal range.

## 2020-06-14 NOTE — Progress Notes (Signed)
He does have some protein in his urine from high blood pressure and sugars.  His hepatitis C screen was negative for the virus.

## 2020-06-19 ENCOUNTER — Other Ambulatory Visit: Payer: Self-pay | Admitting: Internal Medicine

## 2020-07-05 DIAGNOSIS — J449 Chronic obstructive pulmonary disease, unspecified: Secondary | ICD-10-CM | POA: Diagnosis not present

## 2020-07-16 ENCOUNTER — Ambulatory Visit
Admission: RE | Admit: 2020-07-16 | Discharge: 2020-07-16 | Disposition: A | Discharge status: Home / Self Care | Payer: No Typology Code available for payment source | Source: Ambulatory Visit | Attending: Obstetrics and Gynecology | Admitting: Obstetrics and Gynecology

## 2020-07-16 ENCOUNTER — Other Ambulatory Visit: Payer: Self-pay

## 2020-07-16 ENCOUNTER — Other Ambulatory Visit: Payer: Self-pay | Admitting: Obstetrics and Gynecology

## 2020-07-16 ENCOUNTER — Ambulatory Visit: Payer: Medicare HMO | Admitting: Internal Medicine

## 2020-07-16 DIAGNOSIS — Z111 Encounter for screening for respiratory tuberculosis: Secondary | ICD-10-CM

## 2020-07-19 ENCOUNTER — Other Ambulatory Visit: Payer: Self-pay | Admitting: Internal Medicine

## 2020-07-30 NOTE — Telephone Encounter (Signed)
This encounter was created in error - please disregard.

## 2020-08-05 DIAGNOSIS — J449 Chronic obstructive pulmonary disease, unspecified: Secondary | ICD-10-CM | POA: Diagnosis not present

## 2020-08-08 ENCOUNTER — Other Ambulatory Visit: Payer: Self-pay | Admitting: Internal Medicine

## 2020-08-08 DIAGNOSIS — E1169 Type 2 diabetes mellitus with other specified complication: Secondary | ICD-10-CM

## 2020-08-08 DIAGNOSIS — E785 Hyperlipidemia, unspecified: Secondary | ICD-10-CM

## 2020-08-08 MED ORDER — FLUTICASONE-SALMETEROL 250-50 MCG/DOSE IN AEPB
INHALATION_SPRAY | RESPIRATORY_TRACT | 5 refills | Status: DC
Start: 2020-08-08 — End: 2021-07-10

## 2020-08-20 ENCOUNTER — Encounter: Payer: Medicare HMO | Admitting: Nurse Practitioner

## 2020-08-20 ENCOUNTER — Other Ambulatory Visit: Payer: Self-pay

## 2020-08-20 ENCOUNTER — Telehealth: Payer: Self-pay

## 2020-08-20 NOTE — Telephone Encounter (Signed)
Tried calling patient to start AWV, but no answer and no voicemail to leave a message. I also called patient's brother Clarence Maldonado and he stated that patient probably would not be able to do AWV because patient doesn't understand very well. He also stated that patient should probably not be contacted in the future to do one as well.

## 2020-08-21 NOTE — Progress Notes (Signed)
.  err This encounter was created in error - please disregard. 

## 2020-09-04 DIAGNOSIS — J449 Chronic obstructive pulmonary disease, unspecified: Secondary | ICD-10-CM | POA: Diagnosis not present

## 2020-10-05 DIAGNOSIS — J449 Chronic obstructive pulmonary disease, unspecified: Secondary | ICD-10-CM | POA: Diagnosis not present

## 2020-10-14 ENCOUNTER — Other Ambulatory Visit: Payer: Medicare HMO

## 2020-10-17 ENCOUNTER — Ambulatory Visit (INDEPENDENT_AMBULATORY_CARE_PROVIDER_SITE_OTHER): Payer: Medicare HMO | Admitting: Internal Medicine

## 2020-10-17 ENCOUNTER — Other Ambulatory Visit: Payer: Medicare HMO

## 2020-10-17 ENCOUNTER — Other Ambulatory Visit: Payer: Self-pay

## 2020-10-17 ENCOUNTER — Encounter: Payer: Self-pay | Admitting: Internal Medicine

## 2020-10-17 VITALS — BP 125/65 | HR 79 | Temp 96.9°F | Ht 66.0 in | Wt 166.6 lb

## 2020-10-17 DIAGNOSIS — E1122 Type 2 diabetes mellitus with diabetic chronic kidney disease: Secondary | ICD-10-CM | POA: Diagnosis not present

## 2020-10-17 DIAGNOSIS — I10 Essential (primary) hypertension: Secondary | ICD-10-CM | POA: Diagnosis not present

## 2020-10-17 DIAGNOSIS — E440 Moderate protein-calorie malnutrition: Secondary | ICD-10-CM

## 2020-10-17 DIAGNOSIS — Z8611 Personal history of tuberculosis: Secondary | ICD-10-CM | POA: Insufficient documentation

## 2020-10-17 DIAGNOSIS — N183 Chronic kidney disease, stage 3 unspecified: Secondary | ICD-10-CM | POA: Diagnosis not present

## 2020-10-17 DIAGNOSIS — Z716 Tobacco abuse counseling: Secondary | ICD-10-CM

## 2020-10-17 DIAGNOSIS — F17200 Nicotine dependence, unspecified, uncomplicated: Secondary | ICD-10-CM

## 2020-10-17 DIAGNOSIS — J449 Chronic obstructive pulmonary disease, unspecified: Secondary | ICD-10-CM | POA: Diagnosis not present

## 2020-10-17 DIAGNOSIS — I428 Other cardiomyopathies: Secondary | ICD-10-CM | POA: Diagnosis not present

## 2020-10-17 MED ORDER — ASPIRIN EC 81 MG PO TBEC
81.0000 mg | DELAYED_RELEASE_TABLET | Freq: Every day | ORAL | 3 refills | Status: DC
Start: 2020-10-17 — End: 2023-06-24

## 2020-10-17 NOTE — Patient Instructions (Signed)
You can get your covid booster after mid march.  Please gradually cut down on your cigarettes.

## 2020-10-17 NOTE — Progress Notes (Signed)
Location:  [facility] clinic Provider:  Azazel Franze L. Renato Gails, D.O., C.M.D.  Goals of Care:  Advanced Directives 10/17/2020  Does Patient Have a Medical Advance Directive? No  Does patient want to make changes to medical advance directive? -  Would patient like information on creating a medical advance directive? No - Patient declined     Chief Complaint  Patient presents with  . Medical Management of Chronic Issues    4 month follow-up, patient will get covid booster on Monday. Discuss need for colonoscopy and eye exam. Here with friend [name].     HPI: Patient is a 75 y.o. male seen today for medical management of chronic diseases.    Still smoking--about a pack a day.  BP a little high this am.  Checked bp this am at home and it was 125/65 with HR 97.  Was higher here but we used the home reading b/c he gets worked up coming in here.    His left side is numb on the side where he had the pneumothorax when he was hospitalized and had his TB diagnosis.  He finished his 4 drug regimen for TB.  He is now cured.    He still gets spells where he is so short-winded that he has to sit down.  He will not walk.  Cardiology recommended he take a 30 min walk each am but he won't do it.    He's also on D3, C and a natural medicine for sob that [name] gave him.    He's gained 9 more lbs (also was wearing his leather jacket).  He feels clearer-headed.  He overall feels much better.  Has not been to the eye doctor--uses drug store glasses.  His caregiver, [name], is doing his pillbox for him and deliberately left out his aspirin 81mg  because he bruises easily.  Advised that he needs this med due to heart disease--[name] agrees to restart it for him.  Past Medical History:  Diagnosis Date  . Abnormal CT of the chest 2009   nodule 9 x 6 mm RUL  . Benign localized hyperplasia of prostate with urinary obstruction and other lower urinary tract symptoms (LUTS)(600.21)   . Cardiomegaly   .  Cerebrovascular accident (stroke) (HCC) 2009   lacunar infarct in the left thalamus  . Cholelithiasis   . Chronic airway obstruction, not elsewhere classified   . Chronic kidney disease, stage III (moderate) (HCC)   . Closed femur fracture (HCC) 1960  . Contact with or exposure to viral disease    suspected   . Illiterate   . Obesity, unspecified   . Other and unspecified hyperlipidemia   . Special screening examination for respiratory tuberculosis   . Tobacco use disorder   . Tuberculosis   . Type II or unspecified type diabetes mellitus with renal manifestations, uncontrolled(250.42)   . Unspecified essential hypertension     Past Surgical History:  Procedure Laterality Date  . FEMUR FRACTURE SURGERY Left 1960    No Known Allergies  Outpatient Encounter Medications as of 10/17/2020  Medication Sig  . albuterol (VENTOLIN HFA) 108 (90 Base) MCG/ACT inhaler INHALE 1 TO 2 PUFFS BY MOUTH EVERY 4 TO 6 HOURS AS NEEDED FOR BREATHING  . aspirin EC 81 MG tablet Take 1 tablet (81 mg total) by mouth in the morning and at bedtime.  10/19/2020 buPROPion (WELLBUTRIN XL) 150 MG 24 hr tablet TAKE 1 TABLET(150 MG) BY MOUTH DAILY  . FEROSUL 325 (65 Fe) MG tablet  TAKE 1 TABLET(325 MG) BY MOUTH TWICE DAILY WITH A MEAL  . Fluticasone-Salmeterol (ADVAIR DISKUS) 250-50 MCG/DOSE AEPB INHALE 1 PUFF INTO THE LUNGS TWICE DAILY  . furosemide (LASIX) 20 MG tablet TAKE 1 TABLET(20 MG) BY MOUTH DAILY  . glimepiride (AMARYL) 1 MG tablet Take 1 tablet (1 mg total) by mouth daily with breakfast.  . lovastatin (MEVACOR) 20 MG tablet TAKE 1 TABLET(20 MG) BY MOUTH DAILY AT 6 PM  . metoprolol succinate (TOPROL XL) 25 MG 24 hr tablet Take 1 tablet (25 mg total) by mouth daily.  Marland Kitchen lisinopril (ZESTRIL) 2.5 MG tablet Take 1 tablet (2.5 mg total) by mouth daily.  . [DISCONTINUED] isoniazid (NYDRAZID) 300 MG tablet Take 1 tablet (300 mg total) by mouth daily.  . [DISCONTINUED] loratadine (CLARITIN) 10 MG tablet Take 10 mg by mouth  daily.  . [DISCONTINUED] pyridOXINE (B-6) 50 MG tablet Take 1 tablet (50 mg total) by mouth daily.  . [DISCONTINUED] rifampin (RIFADIN) 300 MG capsule Take 2 capsules (600 mg total) by mouth daily.   No facility-administered encounter medications on file as of 10/17/2020.    Review of Systems:  Review of Systems  Constitutional: Negative for chills, fever, malaise/fatigue and weight loss.  HENT: Positive for hearing loss. Negative for congestion and sore throat.   Eyes: Negative for blurred vision.       Farsighted  Respiratory: Positive for cough, sputum production and shortness of breath. Negative for wheezing.   Cardiovascular: Negative for chest pain and leg swelling.  Gastrointestinal: Negative for abdominal pain, blood in stool, constipation and melena.  Genitourinary: Negative for dysuria.  Musculoskeletal: Negative for falls and joint pain.  Skin: Negative for itching and rash.  Neurological: Negative for dizziness and loss of consciousness.  Endo/Heme/Allergies: Bruises/bleeds easily.  Psychiatric/Behavioral: Positive for memory loss.       Intellectual disability, illiterate    Health Maintenance  Topic Date Due  . OPHTHALMOLOGY EXAM  Never done  . COLONOSCOPY (Pts 45-37yrs Insurance coverage will need to be confirmed)  Never done  . COVID-19 Vaccine (3 - Booster for Moderna series) 10/18/2020  . HEMOGLOBIN A1C  12/11/2020  . FOOT EXAM  06/13/2021  . URINE MICROALBUMIN  06/13/2021  . TETANUS/TDAP  01/10/2022  . INFLUENZA VACCINE  Completed  . Hepatitis C Screening  Completed  . PNA vac Low Risk Adult  Completed    Physical Exam: Vitals:   10/17/20 1104  BP: (!) 148/80  Pulse: 79  Temp: (!) 96.9 F (36.1 C)  TempSrc: Temporal  SpO2: 96%  Weight: 166 lb 9.6 oz (75.6 kg)  Height: 5\' 6"  (1.676 m)   Body mass index is 26.89 kg/m. Physical Exam Vitals reviewed.  Constitutional:      Appearance: Normal appearance. He is not toxic-appearing.     Comments:  Extremely potent cigarette smell in room  HENT:     Right Ear: External ear normal.     Left Ear: External ear normal.     Ears:     Comments: HOH Eyes:     Conjunctiva/sclera: Conjunctivae normal.     Pupils: Pupils are equal, round, and reactive to light.  Cardiovascular:     Rate and Rhythm: Normal rate and regular rhythm.     Pulses: Normal pulses.     Heart sounds: Normal heart sounds.  Pulmonary:     Effort: Pulmonary effort is normal.     Breath sounds: Rhonchi present.  Abdominal:     General: Bowel sounds are normal.  Musculoskeletal:        General: Normal range of motion.     Cervical back: Neck supple.     Right lower leg: No edema.     Left lower leg: No edema.  Lymphadenopathy:     Cervical: No cervical adenopathy.  Neurological:     General: No focal deficit present.     Mental Status: He is alert and oriented to person, place, and time.  Psychiatric:        Mood and Affect: Mood normal.        Behavior: Behavior normal.     Comments: Lacks insight and understanding of his illnesses      Labs reviewed: Basic Metabolic Panel: Recent Labs    01/26/20 1500 01/28/20 0505 06/13/20 1222  NA 142 137 139  K 3.9 3.6 4.2  CL 103 99 106  CO2 28 26 25   GLUCOSE 133* 57* 75  BUN 27* 29* 18  CREATININE 1.43* 1.58* 1.48*  CALCIUM 9.4 9.2 8.9   Liver Function Tests: Recent Labs    06/13/20 1222  AST 20  ALT 9  BILITOT 0.4  PROT 7.1   No results for input(s): LIPASE, AMYLASE in the last 8760 hours. No results for input(s): AMMONIA in the last 8760 hours. CBC: Recent Labs    01/27/20 0412 01/28/20 0505 06/13/20 1222  WBC 6.3 5.7 7.7  NEUTROABS  --   --  4,435  HGB 11.8* 12.8* 16.3  HCT 38.5* 42.0 49.6  MCV 85.4 84.5 89.0  PLT 196 232 188   Lipid Panel: Recent Labs    01/26/20 2053 06/13/20 1222  CHOL 148 166  HDL 38* 54  LDLCALC 101* 92  TRIG 45 102  CHOLHDL 3.9 3.1   Lab Results  Component Value Date   HGBA1C 5.5 06/13/2020     Assessment/Plan 1. Controlled type 2 diabetes mellitus with stage 3 chronic kidney disease, without long-term current use of insulin (HCC) -well controlled since his tremendous weight loss when he had TB hospitalization  Lab Results  Component Value Date   HGBA1C 5.5 06/13/2020   - Hemoglobin A1c - Lipid panel - CBC with Differential/Platelet - Basic metabolic panel  2. Moderate protein-calorie malnutrition (HCC) -improved, has gained some weight, does not eat healthy foods though, limited budget  3. Nonischemic cardiomyopathy (HCC) - ongoing, educated on how this developed -cont bp, cholesterol control, baby asa restarted - Lipid panel  4. Tobacco use disorder -ongoing, encouraged him to quit  5. History of TB (tuberculosis) -has completed 4 drug regimen now and "cured"  6. Essential hypertension -bp at goal and for some reason, he had not even had his meds today - Basic metabolic panel  7. Encounter for smoking cessation counseling -he's not ready or willing -suggested reduction from 1ppd to 1/2ppd by next visit in 6 mos  Labs/tests ordered:   Lab Orders     Hemoglobin A1c     Lipid panel     CBC with Differential/Platelet     Basic metabolic panel  Next appt:  6 mos  Damareon Lanni L. Wynell Halberg, D.O. Geriatrics 08/13/2020 [facility] [address], [place] Cell Phone (Mon-Fri 8am-5pm):  [phone] On Call:  [phone] & follow prompts after 5pm & weekends Office Phone:  [phone] Office Fax:  [phone]

## 2020-10-18 LAB — HEMOGLOBIN A1C
Hgb A1c MFr Bld: 5.3 % of total Hgb (ref <5.7)
Mean Plasma Glucose: 105 mg/dL
eAG (mmol/L): 5.8 mmol/L

## 2020-10-18 LAB — BASIC METABOLIC PANEL
BUN/Creatinine Ratio: 14 (calc) (ref 6–22)
BUN: 25 mg/dL (ref 7–25)
CO2: 27 mmol/L (ref 20–32)
Calcium: 9.6 mg/dL (ref 8.6–10.3)
Chloride: 103 mmol/L (ref 98–110)
Creat: 1.8 mg/dL — ABNORMAL HIGH (ref 0.70–1.18)
Glucose, Bld: 75 mg/dL (ref 65–99)
Potassium: 4.9 mmol/L (ref 3.5–5.3)
Sodium: 141 mmol/L (ref 135–146)

## 2020-10-18 LAB — CBC WITH DIFFERENTIAL/PLATELET
Absolute Monocytes: 963 cells/uL — ABNORMAL HIGH (ref 200–950)
Basophils Absolute: 90 cells/uL (ref 0–200)
Basophils Relative: 1 %
Eosinophils Absolute: 468 cells/uL (ref 15–500)
Eosinophils Relative: 5.2 %
HCT: 52.3 % — ABNORMAL HIGH (ref 38.5–50.0)
Hemoglobin: 17.4 g/dL — ABNORMAL HIGH (ref 13.2–17.1)
Lymphs Abs: 1818 cells/uL (ref 850–3900)
MCH: 29.9 pg (ref 27.0–33.0)
MCHC: 33.3 g/dL (ref 32.0–36.0)
MCV: 90 fL (ref 80.0–100.0)
MPV: 9.5 fL (ref 7.5–12.5)
Monocytes Relative: 10.7 %
Neutro Abs: 5661 cells/uL (ref 1500–7800)
Neutrophils Relative %: 62.9 %
Platelets: 191 10*3/uL (ref 140–400)
RBC: 5.81 10*6/uL — ABNORMAL HIGH (ref 4.20–5.80)
RDW: 13.2 % (ref 11.0–15.0)
Total Lymphocyte: 20.2 %
WBC: 9 10*3/uL (ref 3.8–10.8)

## 2020-10-18 LAB — LIPID PANEL
Cholesterol: 160 mg/dL (ref <200)
HDL: 44 mg/dL (ref >=40)
LDL Cholesterol (Calc): 89 mg/dL (calc)
Non-HDL Cholesterol (Calc): 116 mg/dL (calc) (ref <130)
Total CHOL/HDL Ratio: 3.6 (calc) (ref <5.0)
Triglycerides: 170 mg/dL — ABNORMAL HIGH (ref <150)

## 2020-10-18 NOTE — Progress Notes (Signed)
Sugar average remains normal Triglycerides (starchy fats from pasta, rice, bread, snacks, desserts) trending up Cholesterol otherwise ok Kidney function getting worse which could come from inadequate water intake and if he's taking ibuprofen, aleve, etc.  He should only use tylenol or topicals for pain as discussed with him and [name] several times.

## 2020-11-05 DIAGNOSIS — J449 Chronic obstructive pulmonary disease, unspecified: Secondary | ICD-10-CM | POA: Diagnosis not present

## 2020-11-25 ENCOUNTER — Encounter: Payer: Self-pay | Admitting: Internal Medicine

## 2020-12-03 DIAGNOSIS — J449 Chronic obstructive pulmonary disease, unspecified: Secondary | ICD-10-CM | POA: Diagnosis not present

## 2021-01-03 DIAGNOSIS — J449 Chronic obstructive pulmonary disease, unspecified: Secondary | ICD-10-CM | POA: Diagnosis not present

## 2021-02-02 DIAGNOSIS — J449 Chronic obstructive pulmonary disease, unspecified: Secondary | ICD-10-CM | POA: Diagnosis not present

## 2021-02-17 ENCOUNTER — Other Ambulatory Visit: Payer: Self-pay | Admitting: *Deleted

## 2021-02-17 DIAGNOSIS — F17200 Nicotine dependence, unspecified, uncomplicated: Secondary | ICD-10-CM

## 2021-02-17 DIAGNOSIS — E1122 Type 2 diabetes mellitus with diabetic chronic kidney disease: Secondary | ICD-10-CM

## 2021-02-17 DIAGNOSIS — Z716 Tobacco abuse counseling: Secondary | ICD-10-CM

## 2021-02-17 MED ORDER — LISINOPRIL 2.5 MG PO TABS: 2.5000 mg | ORAL_TABLET | Freq: Every day | ORAL | 1 refills | Status: DC

## 2021-02-17 MED ORDER — GLIMEPIRIDE 1 MG PO TABS: 1.0000 mg | ORAL_TABLET | Freq: Every day | ORAL | 1 refills | Status: DC

## 2021-02-17 NOTE — Telephone Encounter (Signed)
[facility] requested refill.  

## 2021-02-17 NOTE — Addendum Note (Signed)
Addended by: Nelda Severe A on: 02/17/2021 10:51 AM   Modules accepted: Orders

## 2021-03-05 DIAGNOSIS — J449 Chronic obstructive pulmonary disease, unspecified: Secondary | ICD-10-CM | POA: Diagnosis not present

## 2021-04-14 ENCOUNTER — Ambulatory Visit: Payer: Medicare HMO | Admitting: Internal Medicine

## 2021-04-15 ENCOUNTER — Ambulatory Visit (INDEPENDENT_AMBULATORY_CARE_PROVIDER_SITE_OTHER): Payer: Medicare HMO | Admitting: Family Medicine

## 2021-04-15 ENCOUNTER — Encounter: Payer: Self-pay | Admitting: Family Medicine

## 2021-04-15 ENCOUNTER — Other Ambulatory Visit: Payer: Self-pay

## 2021-04-15 VITALS — BP 148/92 | HR 96 | Temp 97.5°F | Ht 66.0 in | Wt 163.6 lb

## 2021-04-15 DIAGNOSIS — E1122 Type 2 diabetes mellitus with diabetic chronic kidney disease: Secondary | ICD-10-CM | POA: Diagnosis not present

## 2021-04-15 DIAGNOSIS — E1169 Type 2 diabetes mellitus with other specified complication: Secondary | ICD-10-CM | POA: Diagnosis not present

## 2021-04-15 DIAGNOSIS — E785 Hyperlipidemia, unspecified: Secondary | ICD-10-CM

## 2021-04-15 DIAGNOSIS — F17200 Nicotine dependence, unspecified, uncomplicated: Secondary | ICD-10-CM

## 2021-04-15 DIAGNOSIS — N183 Chronic kidney disease, stage 3 unspecified: Secondary | ICD-10-CM

## 2021-04-15 DIAGNOSIS — I10 Essential (primary) hypertension: Secondary | ICD-10-CM

## 2021-04-15 NOTE — Patient Instructions (Signed)
Tried to walk and take your dog with you. Take lisinopril for your blood pressure.  Increase to 2 tablets/day, both at bedtime

## 2021-04-15 NOTE — Progress Notes (Signed)
Provider:  Jacalyn Lefevre, MD  Careteam: Patient Care Team: Frederica Kuster, MD as PCP - General (Family Medicine)  PLACE OF SERVICE:  [facility]  Advanced Directive information    No Known Allergies  Chief Complaint  Patient presents with   Medical Management of Chronic Issues    Patient presents today for a 6 month follow-up.     HPI: Patient is a 75 y.o. male patient comes today with his caregiver Mr. [name].  Mr. [name] assumes much of the care for Mr. Blanton since the latter is unable to read and write. He has no specific complaints today.  We spent a fair amount of tall time talking about a new dog that he is adopted.  This pet companion seems to be a very positive influence in his life. His last A1c was 5.3 on glimepiride. He denies dyspnea and chest pain.  Review of Systems:  Review of Systems  All other systems reviewed and are negative.  Past Medical History:  Diagnosis Date   Abnormal CT of the chest 2009   nodule 9 x 6 mm RUL   Benign localized hyperplasia of prostate with urinary obstruction and other lower urinary tract symptoms (LUTS)(600.21)    Cardiomegaly    Cerebrovascular accident (stroke) (HCC) 2009   lacunar infarct in the left thalamus   Cholelithiasis    Chronic airway obstruction, not elsewhere classified    Chronic kidney disease, stage III (moderate) (HCC)    Closed femur fracture (HCC) 1960   Contact with or exposure to viral disease    suspected    Illiterate    Obesity, unspecified    Other and unspecified hyperlipidemia    Special screening examination for respiratory tuberculosis    Tobacco use disorder    Tuberculosis    Type II or unspecified type diabetes mellitus with renal manifestations, uncontrolled(250.42)    Unspecified essential hypertension    Past Surgical History:  Procedure Laterality Date   FEMUR FRACTURE SURGERY Left 1960   Social History:   reports that he has been smoking cigarettes. He has a 30.00  pack-year smoking history. He has never used smokeless tobacco. He reports previous alcohol use. He reports that he does not use drugs.  Family History  Problem Relation Age of Onset   Heart disease Mother     Medications: Patient's Medications  New Prescriptions   No medications on file  Previous Medications   ALBUTEROL (VENTOLIN HFA) 108 (90 BASE) MCG/ACT INHALER    INHALE 1 TO 2 PUFFS BY MOUTH EVERY 4 TO 6 HOURS AS NEEDED FOR BREATHING   ASPIRIN EC 81 MG TABLET    Take 1 tablet (81 mg total) by mouth daily.   BUPROPION (WELLBUTRIN XL) 150 MG 24 HR TABLET    TAKE 1 TABLET(150 MG) BY MOUTH DAILY   FEROSUL 325 (65 FE) MG TABLET    TAKE 1 TABLET(325 MG) BY MOUTH TWICE DAILY WITH A MEAL   FLUTICASONE-SALMETEROL (ADVAIR DISKUS) 250-50 MCG/DOSE AEPB    INHALE 1 PUFF INTO THE LUNGS TWICE DAILY   FUROSEMIDE (LASIX) 20 MG TABLET    TAKE 1 TABLET(20 MG) BY MOUTH DAILY   GLIMEPIRIDE (AMARYL) 1 MG TABLET    Take 1 tablet (1 mg total) by mouth daily with breakfast.   LISINOPRIL (ZESTRIL) 2.5 MG TABLET    Take 1 tablet (2.5 mg total) by mouth daily.   LOVASTATIN (MEVACOR) 20 MG TABLET    TAKE 1 TABLET(20 MG)  BY MOUTH DAILY AT 6 PM   METOPROLOL SUCCINATE (TOPROL XL) 25 MG 24 HR TABLET    Take 1 tablet (25 mg total) by mouth daily.  Modified Medications   No medications on file  Discontinued Medications   No medications on file    Physical Exam:  Vitals:   04/15/21 1413  BP: (!) 148/92  Pulse: 96  Temp: (!) 97.5 F (36.4 C)  TempSrc: Temporal  SpO2: 98%  Weight: 163 lb 9.6 oz (74.2 kg)  Height: 5\' 6"  (1.676 m)   Body mass index is 26.41 kg/m. Wt Readings from Last 3 Encounters:  04/15/21 163 lb 9.6 oz (74.2 kg)  10/17/20 166 lb 9.6 oz (75.6 kg)  06/13/20 157 lb (71.2 kg)    Physical Exam Vitals and nursing note reviewed.  Constitutional:      Appearance: Normal appearance.  Cardiovascular:     Rate and Rhythm: Normal rate and regular rhythm.  Pulmonary:     Effort: Pulmonary  effort is normal.     Breath sounds: Normal breath sounds.  Musculoskeletal:        General: Normal range of motion.  Neurological:     General: No focal deficit present.     Mental Status: He is alert and oriented to person, place, and time.    Labs reviewed: Basic Metabolic Panel: Recent Labs    06/13/20 1222 10/17/20 1152  NA 139 141  K 4.2 4.9  CL 106 103  CO2 25 27  GLUCOSE 75 75  BUN 18 25  CREATININE 1.48* 1.80*  CALCIUM 8.9 9.6   Liver Function Tests: Recent Labs    06/13/20 1222  AST 20  ALT 9  BILITOT 0.4  PROT 7.1   No results for input(s): LIPASE, AMYLASE in the last 8760 hours. No results for input(s): AMMONIA in the last 8760 hours. CBC: Recent Labs    06/13/20 1222 10/17/20 1152  WBC 7.7 9.0  NEUTROABS 4,435 5,661  HGB 16.3 17.4*  HCT 49.6 52.3*  MCV 89.0 90.0  PLT 188 191   Lipid Panel: Recent Labs    06/13/20 1222 10/17/20 1152  CHOL 166 160  HDL 54 44  LDLCALC 92 89  TRIG 102 170*  CHOLHDL 3.1 3.6   TSH: No results for input(s): TSH in the last 8760 hours. A1C: Lab Results  Component Value Date   HGBA1C 5.3 10/17/2020     Assessment/Plan  1. Controlled type 2 diabetes mellitus with stage 3 chronic kidney disease, without long-term current use of insulin (HCC) Diet is probably not the best since he lives alone and does not really cook but A1c has been in the target range recently - Hemoglobin A1c  2. Essential hypertension Blood pressure is a little elevated today at 148/92.  I suggested increase lisinopril to 5 mg/day  3. Tobacco use disorder Continues to smoke  4. Hyperlipidemia associated with type 2 diabetes mellitus (HCC) Takes Mevacor.  Most recent LDL 6 months ago was 89.   10/19/2020, MD [facility] [phone]

## 2021-04-16 LAB — HEMOGLOBIN A1C
Hgb A1c MFr Bld: 5.3 % of total Hgb (ref <5.7)
Mean Plasma Glucose: 105 mg/dL
eAG (mmol/L): 5.8 mmol/L

## 2021-05-19 ENCOUNTER — Other Ambulatory Visit: Payer: Self-pay | Admitting: *Deleted

## 2021-05-19 NOTE — Telephone Encounter (Signed)
Pharmacy requested refill.  ?Pended Rx and sent to Dr. Miller for approval.  ?

## 2021-05-20 MED ORDER — FERROUS SULFATE 325 (65 FE) MG PO TABS
ORAL_TABLET | ORAL | 5 refills | Status: DC
Start: 2021-05-20 — End: 2022-03-04

## 2021-06-17 ENCOUNTER — Other Ambulatory Visit: Payer: Self-pay

## 2021-06-17 ENCOUNTER — Encounter: Payer: Medicare HMO | Admitting: Nurse Practitioner

## 2021-06-27 ENCOUNTER — Other Ambulatory Visit: Payer: Self-pay | Admitting: *Deleted

## 2021-06-27 DIAGNOSIS — F17200 Nicotine dependence, unspecified, uncomplicated: Secondary | ICD-10-CM

## 2021-06-27 MED ORDER — ALBUTEROL SULFATE HFA 108 (90 BASE) MCG/ACT IN AERS: INHALATION_SPRAY | RESPIRATORY_TRACT | 11 refills | Status: DC

## 2021-06-27 NOTE — Telephone Encounter (Signed)
[facility] requested rx.

## 2021-07-09 ENCOUNTER — Other Ambulatory Visit: Payer: Self-pay | Admitting: Cardiology

## 2021-07-10 ENCOUNTER — Other Ambulatory Visit: Payer: Self-pay

## 2021-07-10 MED ORDER — METOPROLOL SUCCINATE ER 25 MG PO TB24: 25.0000 mg | ORAL_TABLET | Freq: Every day | ORAL | 1 refills | Status: DC

## 2021-07-10 MED ORDER — FLUTICASONE-SALMETEROL 250-50 MCG/ACT IN AEPB: 1.0000 | INHALATION_SPRAY | Freq: Two times a day (BID) | RESPIRATORY_TRACT | 3 refills | Status: DC

## 2021-08-20 ENCOUNTER — Other Ambulatory Visit: Payer: Self-pay | Admitting: *Deleted

## 2021-08-20 DIAGNOSIS — F17200 Nicotine dependence, unspecified, uncomplicated: Secondary | ICD-10-CM

## 2021-08-20 DIAGNOSIS — Z716 Tobacco abuse counseling: Secondary | ICD-10-CM

## 2021-08-20 NOTE — Telephone Encounter (Signed)
Pharmacy requested refill.  Pended Rx and sent to Dr. Miller for approval due to HIGH ALERT Warning.  

## 2021-08-21 MED ORDER — BUPROPION HCL ER (XL) 150 MG PO TB24: ORAL_TABLET | ORAL | 1 refills | Status: DC

## 2021-09-02 ENCOUNTER — Other Ambulatory Visit: Payer: Self-pay

## 2021-09-02 DIAGNOSIS — E1169 Type 2 diabetes mellitus with other specified complication: Secondary | ICD-10-CM

## 2021-09-02 MED ORDER — LOVASTATIN 20 MG PO TABS: 20.0000 mg | ORAL_TABLET | Freq: Every day | ORAL | 0 refills | Status: DC

## 2021-09-04 ENCOUNTER — Other Ambulatory Visit: Payer: Self-pay

## 2021-09-04 MED ORDER — FUROSEMIDE 20 MG PO TABS: ORAL_TABLET | ORAL | 5 refills | Status: DC

## 2021-10-13 ENCOUNTER — Telehealth: Payer: Self-pay | Admitting: Family Medicine

## 2021-10-13 NOTE — Telephone Encounter (Signed)
PT's lifelong friend visited [place] and spoke with me and co-worker about denial of PT's insurance through another Financial risk analyst. PT is requesting to change PCPs from Dr.Miller to someone within our practice.

## 2021-10-21 ENCOUNTER — Encounter: Payer: Self-pay | Admitting: Family Medicine

## 2021-10-21 ENCOUNTER — Ambulatory Visit (INDEPENDENT_AMBULATORY_CARE_PROVIDER_SITE_OTHER): Payer: Medicare HMO | Admitting: Family Medicine

## 2021-10-21 ENCOUNTER — Other Ambulatory Visit: Payer: Self-pay

## 2021-10-21 VITALS — BP 150/90 | HR 88 | Temp 96.9°F | Ht 66.0 in | Wt 161.4 lb

## 2021-10-21 DIAGNOSIS — R39198 Other difficulties with micturition: Secondary | ICD-10-CM | POA: Diagnosis not present

## 2021-10-21 DIAGNOSIS — E785 Hyperlipidemia, unspecified: Secondary | ICD-10-CM | POA: Diagnosis not present

## 2021-10-21 DIAGNOSIS — N1832 Chronic kidney disease, stage 3b: Secondary | ICD-10-CM | POA: Diagnosis not present

## 2021-10-21 DIAGNOSIS — E1169 Type 2 diabetes mellitus with other specified complication: Secondary | ICD-10-CM | POA: Diagnosis not present

## 2021-10-21 DIAGNOSIS — N400 Enlarged prostate without lower urinary tract symptoms: Secondary | ICD-10-CM | POA: Diagnosis not present

## 2021-10-21 DIAGNOSIS — E1122 Type 2 diabetes mellitus with diabetic chronic kidney disease: Secondary | ICD-10-CM

## 2021-10-21 DIAGNOSIS — I10 Essential (primary) hypertension: Secondary | ICD-10-CM

## 2021-10-21 DIAGNOSIS — N183 Chronic kidney disease, stage 3 unspecified: Secondary | ICD-10-CM

## 2021-10-21 DIAGNOSIS — J449 Chronic obstructive pulmonary disease, unspecified: Secondary | ICD-10-CM | POA: Diagnosis not present

## 2021-10-21 LAB — COMPLETE METABOLIC PANEL WITH GFR
AG Ratio: 1.7 (calc) (ref 1.0–2.5)
ALT: 9 U/L (ref 9–46)
AST: 18 U/L (ref 10–35)
Albumin: 4.3 g/dL (ref 3.6–5.1)
Alkaline phosphatase (APISO): 83 U/L (ref 35–144)
BUN/Creatinine Ratio: 14 (calc) (ref 6–22)
BUN: 27 mg/dL — ABNORMAL HIGH (ref 7–25)
CO2: 34 mmol/L — ABNORMAL HIGH (ref 20–32)
Calcium: 9.1 mg/dL (ref 8.6–10.3)
Chloride: 101 mmol/L (ref 98–110)
Creat: 1.88 mg/dL — ABNORMAL HIGH (ref 0.70–1.28)
Globulin: 2.6 g/dL (calc) (ref 1.9–3.7)
Glucose, Bld: 167 mg/dL — ABNORMAL HIGH (ref 65–99)
Potassium: 4.4 mmol/L (ref 3.5–5.3)
Sodium: 141 mmol/L (ref 135–146)
Total Bilirubin: 0.5 mg/dL (ref 0.2–1.2)
Total Protein: 6.9 g/dL (ref 6.1–8.1)
eGFR: 37 mL/min/{1.73_m2} — ABNORMAL LOW (ref >=60)

## 2021-10-21 LAB — HEMOGLOBIN A1C
Hgb A1c MFr Bld: 5.3 % of total Hgb (ref <5.7)
Mean Plasma Glucose: 105 mg/dL
eAG (mmol/L): 5.8 mmol/L

## 2021-10-21 LAB — LIPID PANEL
Cholesterol: 179 mg/dL (ref <200)
HDL: 45 mg/dL (ref >=40)
LDL Cholesterol (Calc): 100 mg/dL (calc) — ABNORMAL HIGH
Non-HDL Cholesterol (Calc): 134 mg/dL (calc) — ABNORMAL HIGH (ref <130)
Total CHOL/HDL Ratio: 4 (calc) (ref <5.0)
Triglycerides: 220 mg/dL — ABNORMAL HIGH (ref <150)

## 2021-10-21 MED ORDER — TAMSULOSIN HCL 0.4 MG PO CAPS: 0.4000 mg | ORAL_CAPSULE | Freq: Every day | ORAL | 3 refills | Status: DC

## 2021-10-21 NOTE — Progress Notes (Signed)
Provider:  Alain Honey, MD  Careteam: Patient Care Team: Wardell Honour, MD as PCP - General (Family Medicine)  PLACE OF SERVICE:  [place] Directive information    No Known Allergies  Chief Complaint  Patient presents with   Medical Management of Chronic Issues    Patient presents today for a 6 month follow up.   Quality Metric Gaps    Eye &foot exam, colonoscopy, A1C, urine microalbumin, zoster, Flu, COVID booster     HPI: Patient is a 76 y.o. male he is here today with his caretaker Mr. [name].  He complains of increased shortness of breath.  He uses Advair as well as albuterol.  He has some a.m. cough productive of clear mucus. Also takes glimepiride for diabetes.  Last A1c was 5.3. Describes some difficulty with urination and decrease in strength of stream.  Review of Systems:  Review of Systems  Constitutional: Negative.   Respiratory:  Positive for shortness of breath.   Cardiovascular: Negative.   Genitourinary:  Positive for dysuria.  Neurological: Negative.   Psychiatric/Behavioral: Negative.     Past Medical History:  Diagnosis Date   Abnormal CT of the chest 2009   nodule 9 x 6 mm RUL   Benign localized hyperplasia of prostate with urinary obstruction and other lower urinary tract symptoms (LUTS)(600.21)    Cardiomegaly    Cerebrovascular accident (stroke) ([place]) 2009   lacunar infarct in the left thalamus   Cholelithiasis    Chronic airway obstruction, not elsewhere classified    Chronic kidney disease, stage III (moderate) (HCC)    Closed femur fracture ([place]) 1960   Contact with or exposure to viral disease    suspected    Illiterate    Obesity, unspecified    Other and unspecified hyperlipidemia    Special screening examination for respiratory tuberculosis    Tobacco use disorder    Tuberculosis    Type II or unspecified type diabetes mellitus with renal manifestations, uncontrolled(250.42)    Unspecified essential  hypertension    Past Surgical History:  Procedure Laterality Date   FEMUR FRACTURE SURGERY Left 1960   Social History:   reports that he has been smoking cigarettes. He has a 30.00 pack-year smoking history. He has never used smokeless tobacco. He reports that he does not currently use alcohol. He reports that he does not use drugs.  Family History  Problem Relation Age of Onset   Heart disease Mother     Medications: Patient's Medications  New Prescriptions   TAMSULOSIN (FLOMAX) 0.4 MG CAPS CAPSULE    Take 1 capsule (0.4 mg total) by mouth daily. Take with food  Previous Medications   ALBUTEROL (VENTOLIN HFA) 108 (90 BASE) MCG/ACT INHALER    INHALE 1 TO 2 PUFFS BY MOUTH EVERY 4 TO 6 HOURS AS NEEDED FOR BREATHING   ASPIRIN EC 81 MG TABLET    Take 1 tablet (81 mg total) by mouth daily.   BUPROPION (WELLBUTRIN XL) 150 MG 24 HR TABLET    TAKE 1 TABLET(150 MG) BY MOUTH DAILY   FERROUS SULFATE (FEROSUL) 325 (65 FE) MG TABLET    Take one tablet by mouth twice daily with meal   FLUTICASONE-SALMETEROL (ADVAIR) 250-50 MCG/ACT AEPB    Inhale 1 puff into the lungs in the morning and at bedtime.   FUROSEMIDE (LASIX) 20 MG TABLET    TAKE 1 TABLET(20 MG) BY MOUTH DAILY   GLIMEPIRIDE (AMARYL) 1 MG TABLET  Take 1 tablet (1 mg total) by mouth daily with breakfast.   LISINOPRIL (ZESTRIL) 2.5 MG TABLET    Take 1 tablet (2.5 mg total) by mouth daily.   LOVASTATIN (MEVACOR) 20 MG TABLET    Take 1 tablet (20 mg total) by mouth at bedtime.   METOPROLOL SUCCINATE (TOPROL XL) 25 MG 24 HR TABLET    Take 1 tablet (25 mg total) by mouth daily.  Modified Medications   No medications on file  Discontinued Medications   No medications on file    Physical Exam:  Vitals:   10/21/21 1341  BP: (!) 150/90  Pulse: 88  Temp: (!) 96.9 F (36.1 C)  SpO2: 91%  Weight: 161 lb 6.4 oz (73.2 kg)  Height: 5\' 6"  (1.676 m)   Body mass index is 26.05 kg/m. Wt Readings from Last 3 Encounters:  10/21/21 161 lb 6.4  oz (73.2 kg)  04/15/21 163 lb 9.6 oz (74.2 kg)  10/17/20 166 lb 9.6 oz (75.6 kg)    Physical Exam Vitals and nursing note reviewed.  Constitutional:      Appearance: Normal appearance.  Cardiovascular:     Rate and Rhythm: Normal rate and regular rhythm.  Pulmonary:     Breath sounds: Wheezing present.  Neurological:     General: No focal deficit present.     Mental Status: He is alert and oriented to person, place, and time.    Labs reviewed: Basic Metabolic Panel: No results for input(s): NA, K, CL, CO2, GLUCOSE, BUN, CREATININE, CALCIUM, MG, PHOS, TSH in the last 8760 hours. Liver Function Tests: No results for input(s): AST, ALT, ALKPHOS, BILITOT, PROT, ALBUMIN in the last 8760 hours. No results for input(s): LIPASE, AMYLASE in the last 8760 hours. No results for input(s): AMMONIA in the last 8760 hours. CBC: No results for input(s): WBC, NEUTROABS, HGB, HCT, MCV, PLT in the last 8760 hours. Lipid Panel: No results for input(s): CHOL, HDL, LDLCALC, TRIG, CHOLHDL, LDLDIRECT in the last 8760 hours. TSH: No results for input(s): TSH in the last 8760 hours. A1C: Lab Results  Component Value Date   HGBA1C 5.3 04/15/2021     Assessment/Plan  1. Difficulty urinating Likely related to BPH.  We will try Flomax and reassess in 2 months  2. Controlled type 2 diabetes mellitus with stage 3 chronic kidney disease, without long-term current use of insulin (HCC) A1c at goal on glimepiride 1 mg/day  3. Hyperlipidemia associated with type 2 diabetes mellitus ([place]) Taking Mevacor.  Last LDL was 89 1 year ago  4. Essential hypertension Blood pressure is okay at 150/90 currently takes them metoprolol 25 mg at bedtime and lisinopril 2.5 mg.  Will increase lisinopril to 10 mg  5. BPH without obstruction/lower urinary tract symptoms Patient having some hesitancy and decreased caliber of stream  6. Stage 3b chronic kidney disease ([place]) Will check status of renal function  today   Alain Honey, MD [place] [phone]

## 2021-11-04 ENCOUNTER — Other Ambulatory Visit: Payer: Self-pay | Admitting: *Deleted

## 2021-11-04 DIAGNOSIS — E1169 Type 2 diabetes mellitus with other specified complication: Secondary | ICD-10-CM

## 2021-11-04 MED ORDER — LOVASTATIN 20 MG PO TABS: 20.0000 mg | ORAL_TABLET | Freq: Every day | ORAL | 1 refills | Status: DC

## 2021-11-04 NOTE — Telephone Encounter (Signed)
[facility] Requested refill.

## 2021-11-27 ENCOUNTER — Other Ambulatory Visit: Payer: Self-pay | Admitting: *Deleted

## 2021-11-27 MED ORDER — FLUTICASONE-SALMETEROL 250-50 MCG/ACT IN AEPB: 1.0000 | INHALATION_SPRAY | Freq: Two times a day (BID) | RESPIRATORY_TRACT | 3 refills | Status: DC

## 2021-11-27 NOTE — Telephone Encounter (Signed)
[facility] Requested refill.  ?

## 2021-12-23 ENCOUNTER — Ambulatory Visit: Payer: Medicare HMO | Admitting: Family Medicine

## 2022-02-24 ENCOUNTER — Ambulatory Visit: Payer: Medicare HMO | Admitting: Family Medicine

## 2022-03-04 ENCOUNTER — Other Ambulatory Visit: Payer: Self-pay

## 2022-03-04 MED ORDER — METOPROLOL SUCCINATE ER 25 MG PO TB24: 25.0000 mg | ORAL_TABLET | Freq: Every day | ORAL | 1 refills | Status: DC

## 2022-03-04 MED ORDER — FERROUS SULFATE 325 (65 FE) MG PO TABS: 325.0000 mg | ORAL_TABLET | Freq: Two times a day (BID) | ORAL | 5 refills | Status: DC

## 2022-03-04 NOTE — Telephone Encounter (Signed)
Patient has request refill on medication Metoprolol 25mg . Patient medication last refilled 07/10/2021. Patient medication has High Risk Warnings. Medication pend and sent to Marlowe Sax, NP due to PCP Sabra Heck Lillette Boxer, MD being out of office.

## 2022-03-17 ENCOUNTER — Ambulatory Visit (INDEPENDENT_AMBULATORY_CARE_PROVIDER_SITE_OTHER): Payer: Medicare HMO | Admitting: Family Medicine

## 2022-03-17 ENCOUNTER — Encounter: Payer: Self-pay | Admitting: Family Medicine

## 2022-03-17 VITALS — BP 118/74 | HR 70 | Temp 97.1°F | Ht 66.0 in | Wt 147.6 lb

## 2022-03-17 DIAGNOSIS — N1832 Chronic kidney disease, stage 3b: Secondary | ICD-10-CM | POA: Diagnosis not present

## 2022-03-17 DIAGNOSIS — E782 Mixed hyperlipidemia: Secondary | ICD-10-CM

## 2022-03-17 DIAGNOSIS — I10 Essential (primary) hypertension: Secondary | ICD-10-CM

## 2022-03-17 DIAGNOSIS — E1122 Type 2 diabetes mellitus with diabetic chronic kidney disease: Secondary | ICD-10-CM

## 2022-03-17 DIAGNOSIS — N183 Chronic kidney disease, stage 3 unspecified: Secondary | ICD-10-CM

## 2022-03-17 MED ORDER — FLUTICASONE-SALMETEROL 250-50 MCG/ACT IN AEPB: 1.0000 | INHALATION_SPRAY | Freq: Two times a day (BID) | RESPIRATORY_TRACT | 3 refills | Status: DC

## 2022-03-17 NOTE — Progress Notes (Signed)
Provider:  Jacalyn Lefevre, MD  Careteam: Patient Care Team: Frederica Kuster, MD as PCP - General (Family Medicine)  PLACE OF SERVICE:  [facility]  Advanced Directive information    No Known Allergies  No chief complaint on file.    HPI: Patient is a 76 y.o. male routine follow-up for chronic problems including type 2 diabetes, chronic kidney disease stage IIIb, history of TB, hyperlipidemia and atherosclerosis of aorta.  He tells me that he is doing well.  He denies any shortness of breath or chest pain.  He uses Advair as well as albuterol inhalers. He does complain today of easy bruising and has bruises over his upper extremities.  He attributes this to his dog. He has poor insight as to his medical problems and he is cared for largely by Mr. [name].  He lives in an RV with his dog.  Review of Systems:  Review of Systems  HENT: Negative.    Respiratory: Negative.    Cardiovascular: Negative.   Neurological: Negative.   Endo/Heme/Allergies:  Bruises/bleeds easily.  Psychiatric/Behavioral: Negative.    All other systems reviewed and are negative.   Past Medical History:  Diagnosis Date   Abnormal CT of the chest 2009   nodule 9 x 6 mm RUL   Benign localized hyperplasia of prostate with urinary obstruction and other lower urinary tract symptoms (LUTS)(600.21)    Cardiomegaly    Cerebrovascular accident (stroke) (HCC) 2009   lacunar infarct in the left thalamus   Cholelithiasis    Chronic airway obstruction, not elsewhere classified    Chronic kidney disease, stage III (moderate) (HCC)    Closed femur fracture (HCC) 1960   Contact with or exposure to viral disease    suspected    Illiterate    Obesity, unspecified    Other and unspecified hyperlipidemia    Special screening examination for respiratory tuberculosis    Tobacco use disorder    Tuberculosis    Type II or unspecified type diabetes mellitus with renal manifestations, uncontrolled(250.42)     Unspecified essential hypertension    Past Surgical History:  Procedure Laterality Date   FEMUR FRACTURE SURGERY Left 1960   Social History:   reports that he has been smoking cigarettes. He has a 30.00 pack-year smoking history. He has never used smokeless tobacco. He reports that he does not currently use alcohol. He reports that he does not use drugs.  Family History  Problem Relation Age of Onset   Heart disease Mother     Medications: Patient's Medications  New Prescriptions   No medications on file  Previous Medications   ALBUTEROL (VENTOLIN HFA) 108 (90 BASE) MCG/ACT INHALER    INHALE 1 TO 2 PUFFS BY MOUTH EVERY 4 TO 6 HOURS AS NEEDED FOR BREATHING   ASPIRIN EC 81 MG TABLET    Take 1 tablet (81 mg total) by mouth daily.   BUPROPION (WELLBUTRIN XL) 150 MG 24 HR TABLET    TAKE 1 TABLET(150 MG) BY MOUTH DAILY   FERROUS SULFATE (FEROSUL) 325 (65 FE) MG TABLET    Take 1 tablet (325 mg total) by mouth 2 (two) times daily with a meal.   FUROSEMIDE (LASIX) 20 MG TABLET    TAKE 1 TABLET(20 MG) BY MOUTH DAILY   GLIMEPIRIDE (AMARYL) 1 MG TABLET    Take 1 tablet (1 mg total) by mouth daily with breakfast.   LISINOPRIL (ZESTRIL) 2.5 MG TABLET    Take 1 tablet (2.5 mg  total) by mouth daily.   LOVASTATIN (MEVACOR) 20 MG TABLET    Take 1 tablet (20 mg total) by mouth at bedtime.   METOPROLOL SUCCINATE (TOPROL XL) 25 MG 24 HR TABLET    Take 1 tablet (25 mg total) by mouth daily.   TAMSULOSIN (FLOMAX) 0.4 MG CAPS CAPSULE    Take 1 capsule (0.4 mg total) by mouth daily. Take with food  Modified Medications   Modified Medication Previous Medication   FLUTICASONE-SALMETEROL (ADVAIR) 250-50 MCG/ACT AEPB fluticasone-salmeterol (ADVAIR) 250-50 MCG/ACT AEPB      Inhale 1 puff into the lungs in the morning and at bedtime.    Inhale 1 puff into the lungs in the morning and at bedtime.  Discontinued Medications   No medications on file    Physical Exam:  Vitals:   03/17/22 1340  BP: 118/74   Pulse: 70  Temp: (!) 97.1 F (36.2 C)  SpO2: 96%  Weight: 147 lb 9.6 oz (67 kg)  Height: 5\' 6"  (1.676 m)   Body mass index is 23.82 kg/m. Wt Readings from Last 3 Encounters:  03/17/22 147 lb 9.6 oz (67 kg)  10/21/21 161 lb 6.4 oz (73.2 kg)  04/15/21 163 lb 9.6 oz (74.2 kg)    Physical Exam Vitals and nursing note reviewed.  Constitutional:      Appearance: Normal appearance.  Cardiovascular:     Rate and Rhythm: Normal rate and regular rhythm.  Pulmonary:     Effort: Pulmonary effort is normal.     Breath sounds: Normal breath sounds.  Neurological:     General: No focal deficit present.     Mental Status: He is alert and oriented to person, place, and time.  Psychiatric:        Mood and Affect: Mood normal.        Behavior: Behavior normal.        Thought Content: Thought content normal.        Judgment: Judgment normal.     Labs reviewed: Basic Metabolic Panel: Recent Labs    10/21/21 1430  NA 141  K 4.4  CL 101  CO2 34*  GLUCOSE 167*  BUN 27*  CREATININE 1.88*  CALCIUM 9.1   Liver Function Tests: Recent Labs    10/21/21 1430  AST 18  ALT 9  BILITOT 0.5  PROT 6.9   No results for input(s): "LIPASE", "AMYLASE" in the last 8760 hours. No results for input(s): "AMMONIA" in the last 8760 hours. CBC: No results for input(s): "WBC", "NEUTROABS", "HGB", "HCT", "MCV", "PLT" in the last 8760 hours. Lipid Panel: Recent Labs    10/21/21 1430  CHOL 179  HDL 45  LDLCALC 100*  TRIG 220*  CHOLHDL 4.0   TSH: No results for input(s): "TSH" in the last 8760 hours. A1C: Lab Results  Component Value Date   HGBA1C 5.3 10/21/2021     Assessment/Plan  1. Controlled type 2 diabetes mellitus with stage 3 chronic kidney disease, without long-term current use of insulin (HCC) Taking glimepiride 1 mg.  Last A1c was 6 months ago and 5.3  2. Stage 3b chronic kidney disease ([place]) Probably related to diabetes and hypertension.  In January of this year her  GFR was 37 and creatinine 1.88  3. Essential hypertension Blood pressure well controlled today at 118/74 on metoprolol  4. Mixed hyperlipidemia He takes lovastatin for lipids LDL was 100 in January of this year   Alain Honey, MD [place] [phone]

## 2022-03-18 LAB — BASIC METABOLIC PANEL WITH GFR
BUN/Creatinine Ratio: 20 (calc) (ref 6–22)
BUN: 41 mg/dL — ABNORMAL HIGH (ref 7–25)
CO2: 29 mmol/L (ref 20–32)
Calcium: 9.5 mg/dL (ref 8.6–10.3)
Chloride: 103 mmol/L (ref 98–110)
Creat: 2.02 mg/dL — ABNORMAL HIGH (ref 0.70–1.28)
Glucose, Bld: 74 mg/dL (ref 65–139)
Potassium: 4.8 mmol/L (ref 3.5–5.3)
Sodium: 140 mmol/L (ref 135–146)
eGFR: 34 mL/min/{1.73_m2} — ABNORMAL LOW (ref >=60)

## 2022-03-18 LAB — HEMOGLOBIN A1C
Hgb A1c MFr Bld: 5.4 % of total Hgb (ref <5.7)
Mean Plasma Glucose: 108 mg/dL
eAG (mmol/L): 6 mmol/L

## 2022-03-25 ENCOUNTER — Other Ambulatory Visit: Payer: Self-pay | Admitting: Family Medicine

## 2022-03-25 ENCOUNTER — Telehealth: Payer: Self-pay

## 2022-03-25 NOTE — Telephone Encounter (Signed)
Message left on clinical intake voicemail:   Patients friend [name] called because patient told him that someone called about his labs today and he did not understand.  See labs dated 03/17/22. I discussed Dr.Miller's response with [name] and informed him that we (clinical staff) are awaiting a final directive on which medication will be prescribed for the abnormal kidney function.   Mr.[name] asked that we call him back once Dr.Miller has decided on medication and sent rx to the pharmacy, so he will know to go pick up medication

## 2022-03-26 MED ORDER — DAPAGLIFLOZIN PROPANEDIOL 5 MG PO TABS: 5.0000 mg | ORAL_TABLET | Freq: Every day | ORAL | 1 refills | Status: DC

## 2022-03-26 NOTE — Telephone Encounter (Signed)
Medication refilled by provider Richarda Blade, NP.

## 2022-03-26 NOTE — Telephone Encounter (Signed)
Spoke to Dr.Miller and he said to send in [place] 5 MG daily to patients pharmacy and to see if patient would like to be seen at the kidney specialist and patient declined.

## 2022-04-28 ENCOUNTER — Other Ambulatory Visit: Payer: Self-pay

## 2022-04-28 DIAGNOSIS — F17200 Nicotine dependence, unspecified, uncomplicated: Secondary | ICD-10-CM

## 2022-04-28 DIAGNOSIS — Z716 Tobacco abuse counseling: Secondary | ICD-10-CM

## 2022-04-28 MED ORDER — BUPROPION HCL ER (XL) 150 MG PO TB24: ORAL_TABLET | ORAL | 1 refills | Status: DC

## 2022-04-28 NOTE — Telephone Encounter (Signed)
[facility] faxed request for medication Bupropion. Medication has High Risk Warnings. Medication pend and sent to PCP Hyacinth Meeker Bertram Millard, MD

## 2022-05-07 ENCOUNTER — Other Ambulatory Visit: Payer: Self-pay

## 2022-05-07 DIAGNOSIS — E1122 Type 2 diabetes mellitus with diabetic chronic kidney disease: Secondary | ICD-10-CM

## 2022-05-07 MED ORDER — GLIMEPIRIDE 1 MG PO TABS: 1.0000 mg | ORAL_TABLET | Freq: Every day | ORAL | 1 refills | Status: DC

## 2022-05-25 ENCOUNTER — Other Ambulatory Visit: Payer: Self-pay

## 2022-05-25 DIAGNOSIS — F17200 Nicotine dependence, unspecified, uncomplicated: Secondary | ICD-10-CM

## 2022-05-25 MED ORDER — ALBUTEROL SULFATE HFA 108 (90 BASE) MCG/ACT IN AERS: INHALATION_SPRAY | RESPIRATORY_TRACT | 11 refills | Status: DC

## 2022-05-25 MED ORDER — FUROSEMIDE 20 MG PO TABS: ORAL_TABLET | ORAL | 5 refills | Status: DC

## 2022-06-22 ENCOUNTER — Telehealth: Payer: Self-pay | Admitting: *Deleted

## 2022-06-22 NOTE — Patient Outreach (Signed)
  Care Coordination   Initial Visit Note   06/22/2022 Name: Clarence Maldonado MRN: [id number] DOB: 27-Oct-1945  DON TIU is a 76 y.o. year old male who sees Wardell Honour, MD for primary care. I spoke with  Mr. Jerlyn Ly, TRESTEN PANTOJA 's friend and unofficial guardian, by phone today.  What matters to the patients health and wellness today?  No current need but Mr. [name] would certainly welcome the opportunity to call in the future.    SDOH assessments and interventions completed:  No    Care Coordination Interventions Activated:  No  Care Coordination Interventions:  No, not indicated   Follow up plan: No further intervention required.   Encounter Outcome:  Pt. Refused   Shalanda Brogden C. Myrtie Neither, MSN, [facility] Gerontological Nurse Practitioner [facility] Care Management [phone]

## 2022-06-29 ENCOUNTER — Other Ambulatory Visit: Payer: Self-pay | Admitting: *Deleted

## 2022-06-29 MED ORDER — LISINOPRIL 2.5 MG PO TABS
2.5000 mg | ORAL_TABLET | Freq: Every day | ORAL | 1 refills | Status: DC
Start: 2022-06-29 — End: 2023-09-24

## 2022-06-29 NOTE — Telephone Encounter (Signed)
Pharmacy requested refill

## 2022-07-07 ENCOUNTER — Other Ambulatory Visit: Payer: Self-pay

## 2022-07-07 DIAGNOSIS — R39198 Other difficulties with micturition: Secondary | ICD-10-CM

## 2022-07-07 MED ORDER — TAMSULOSIN HCL 0.4 MG PO CAPS: 0.4000 mg | ORAL_CAPSULE | Freq: Every day | ORAL | 1 refills | Status: DC

## 2022-07-28 ENCOUNTER — Other Ambulatory Visit: Payer: Self-pay | Admitting: *Deleted

## 2022-07-28 MED ORDER — FLUTICASONE-SALMETEROL 250-50 MCG/ACT IN AEPB: 1.0000 | INHALATION_SPRAY | Freq: Two times a day (BID) | RESPIRATORY_TRACT | 3 refills | Status: DC

## 2022-07-28 NOTE — Telephone Encounter (Signed)
Pharmacy requested refill

## 2022-09-15 ENCOUNTER — Ambulatory Visit: Payer: Medicare HMO | Admitting: Family Medicine

## 2022-09-16 ENCOUNTER — Ambulatory Visit (INDEPENDENT_AMBULATORY_CARE_PROVIDER_SITE_OTHER): Payer: Medicare HMO | Admitting: Family Medicine

## 2022-09-16 ENCOUNTER — Encounter: Payer: Self-pay | Admitting: Family Medicine

## 2022-09-16 VITALS — BP 120/60 | HR 82 | Temp 97.5°F | Ht 66.0 in | Wt 161.0 lb

## 2022-09-16 DIAGNOSIS — Z8611 Personal history of tuberculosis: Secondary | ICD-10-CM | POA: Diagnosis not present

## 2022-09-16 DIAGNOSIS — J449 Chronic obstructive pulmonary disease, unspecified: Secondary | ICD-10-CM | POA: Diagnosis not present

## 2022-09-16 DIAGNOSIS — R0609 Other forms of dyspnea: Secondary | ICD-10-CM

## 2022-09-16 NOTE — Progress Notes (Signed)
Provider:  Jacalyn Lefevre, MD  Careteam: Patient Care Team: Frederica Kuster, MD as PCP - General (Family Medicine)  PLACE OF SERVICE:  [facility]  Advanced Directive information    No Known Allergies  Chief Complaint  Patient presents with   Medical Management of Chronic Issues    Patient presents today for a 6 month follow-up     HPI: Patient is a 76 y.o. male patient is here today with his friend [name] who looks out for him.  Patient is mostly illiterate and presents today with shortness of breath with most any exertion.  He denies dyspnea at rest or orthopnea.  He has a history of many pack years tobacco use as well as tuberculosis 3 or 4 years ago.  He continues to smoke 1-1/2 packs/day.  He denies any cough weight loss night sweats.  He was exercised in the hall here and desaturated significantly down to 80-84 where resting O2 sat was 94.  Patient had to rest while walking and felt short of breath.  Review of Systems:  Review of Systems  Respiratory:  Positive for shortness of breath and wheezing.   All other systems reviewed and are negative.   Past Medical History:  Diagnosis Date   Abnormal CT of the chest 2009   nodule 9 x 6 mm RUL   Benign localized hyperplasia of prostate with urinary obstruction and other lower urinary tract symptoms (LUTS)(600.21)    Cardiomegaly    Cerebrovascular accident (stroke) (HCC) 2009   lacunar infarct in the left thalamus   Cholelithiasis    Chronic airway obstruction, not elsewhere classified    Chronic kidney disease, stage III (moderate) (HCC)    Closed femur fracture (HCC) 1960   Contact with or exposure to viral disease    suspected    Illiterate    Obesity, unspecified    Other and unspecified hyperlipidemia    Special screening examination for respiratory tuberculosis    Tobacco use disorder    Tuberculosis    Type II or unspecified type diabetes mellitus with renal manifestations, uncontrolled(250.42)     Unspecified essential hypertension    Past Surgical History:  Procedure Laterality Date   FEMUR FRACTURE SURGERY Left 1960   Social History:   reports that he has been smoking cigarettes. He has a 30.00 pack-year smoking history. He has never used smokeless tobacco. He reports that he does not currently use alcohol. He reports that he does not use drugs.  Family History  Problem Relation Age of Onset   Heart disease Mother     Medications: Patient's Medications  New Prescriptions   No medications on file  Previous Medications   ALBUTEROL (VENTOLIN HFA) 108 (90 BASE) MCG/ACT INHALER    INHALE 1 TO 2 PUFFS BY MOUTH EVERY 4 TO 6 HOURS AS NEEDED FOR BREATHING   ASPIRIN EC 81 MG TABLET    Take 1 tablet (81 mg total) by mouth daily.   BUPROPION (WELLBUTRIN XL) 150 MG 24 HR TABLET    TAKE 1 TABLET(150 MG) BY MOUTH DAILY   DAPAGLIFLOZIN PROPANEDIOL (FARXIGA) 5 MG TABS TABLET    Take 1 tablet (5 mg total) by mouth daily before breakfast.   FERROUS SULFATE (FEROSUL) 325 (65 FE) MG TABLET    Take 1 tablet (325 mg total) by mouth 2 (two) times daily with a meal.   FLUTICASONE-SALMETEROL (ADVAIR) 250-50 MCG/ACT AEPB    Inhale 1 puff into the lungs in the morning and  at bedtime.   FUROSEMIDE (LASIX) 20 MG TABLET    TAKE 1 TABLET(20 MG) BY MOUTH DAILY   GLIMEPIRIDE (AMARYL) 1 MG TABLET    Take 1 tablet (1 mg total) by mouth daily with breakfast.   LISINOPRIL (ZESTRIL) 2.5 MG TABLET    Take 1 tablet (2.5 mg total) by mouth daily.   LOVASTATIN (MEVACOR) 20 MG TABLET    Take 1 tablet (20 mg total) by mouth at bedtime.   METOPROLOL SUCCINATE (TOPROL XL) 25 MG 24 HR TABLET    Take 1 tablet (25 mg total) by mouth daily.   TAMSULOSIN (FLOMAX) 0.4 MG CAPS CAPSULE    Take 1 capsule (0.4 mg total) by mouth daily. Take with food  Modified Medications   No medications on file  Discontinued Medications   No medications on file    Physical Exam:  Vitals:   09/16/22 1424  BP: 120/60  Pulse: 82  Temp:  (!) 97.5 F (36.4 C)  SpO2: 94%  Weight: 161 lb (73 kg)  Height: 5\' 6"  (1.676 m)   Body mass index is 25.99 kg/m. Wt Readings from Last 3 Encounters:  09/16/22 161 lb (73 kg)  03/17/22 147 lb 9.6 oz (67 kg)  10/21/21 161 lb 6.4 oz (73.2 kg)    Physical Exam Vitals and nursing note reviewed.  Constitutional:      Appearance: Normal appearance.  Cardiovascular:     Rate and Rhythm: Normal rate and regular rhythm.  Pulmonary:     Effort: Pulmonary effort is normal.     Breath sounds: Wheezing present.  Musculoskeletal:     Right lower leg: No edema.     Left lower leg: No edema.  Neurological:     General: No focal deficit present.     Mental Status: He is alert and oriented to person, place, and time.     Labs reviewed: Basic Metabolic Panel: Recent Labs    10/21/21 1430 03/17/22 1427  NA 141 140  K 4.4 4.8  CL 101 103  CO2 34* 29  GLUCOSE 167* 74  BUN 27* 41*  CREATININE 1.88* 2.02*  CALCIUM 9.1 9.5   Liver Function Tests: Recent Labs    10/21/21 1430  AST 18  ALT 9  BILITOT 0.5  PROT 6.9   No results for input(s): "LIPASE", "AMYLASE" in the last 8760 hours. No results for input(s): "AMMONIA" in the last 8760 hours. CBC: No results for input(s): "WBC", "NEUTROABS", "HGB", "HCT", "MCV", "PLT" in the last 8760 hours. Lipid Panel: Recent Labs    10/21/21 1430  CHOL 179  HDL 45  LDLCALC 100*  TRIG 220*  CHOLHDL 4.0   TSH: No results for input(s): "TSH" in the last 8760 hours. A1C: Lab Results  Component Value Date   HGBA1C 5.4 03/17/2022     Assessment/Plan  1. History of TB (tuberculosis) Denies cough or night sweats currently  2. Chronic obstructive pulmonary disease, unspecified COPD type (HCC) Uses Advair inhaler as well as frequent albuterol.  We had a less than in use of inhalers today  3. DOE (dyspnea on exertion) I think this is more lung disease than related to heart.  Will order home oxygen and increase dose of Advair as a  first step Also important for him to discontinue smoking Will set him up for a low-dose chest CT     03/19/2022, MD [facility] [phone]

## 2022-09-23 ENCOUNTER — Other Ambulatory Visit: Payer: Self-pay | Admitting: Family Medicine

## 2022-09-23 DIAGNOSIS — R0609 Other forms of dyspnea: Secondary | ICD-10-CM

## 2022-09-23 NOTE — Progress Notes (Signed)
Low-dose CT was not performed.  Patient may not have received info from radiology so will provide number for his caregiver Mr. [name] to see if they can get in touch with him to be rescheduled

## 2022-10-08 ENCOUNTER — Ambulatory Visit
Admission: RE | Admit: 2022-10-08 | Discharge: 2022-10-08 | Disposition: A | Discharge status: Home / Self Care | Payer: Medicare HMO | Source: Ambulatory Visit | Attending: Family Medicine | Admitting: Family Medicine

## 2022-10-08 DIAGNOSIS — R0609 Other forms of dyspnea: Secondary | ICD-10-CM

## 2022-10-08 DIAGNOSIS — F17210 Nicotine dependence, cigarettes, uncomplicated: Secondary | ICD-10-CM | POA: Diagnosis not present

## 2022-11-13 ENCOUNTER — Other Ambulatory Visit: Payer: Self-pay

## 2022-11-13 MED ORDER — FUROSEMIDE 20 MG PO TABS: ORAL_TABLET | ORAL | 5 refills | Status: DC

## 2022-12-16 ENCOUNTER — Ambulatory Visit: Payer: Medicare HMO | Admitting: Family Medicine

## 2022-12-21 ENCOUNTER — Other Ambulatory Visit: Payer: Self-pay

## 2022-12-21 MED ORDER — FLUTICASONE-SALMETEROL 250-50 MCG/ACT IN AEPB: 1.0000 | INHALATION_SPRAY | Freq: Two times a day (BID) | RESPIRATORY_TRACT | 3 refills | Status: DC

## 2022-12-21 NOTE — Telephone Encounter (Signed)
Refill request received form pharmacy

## 2022-12-23 ENCOUNTER — Encounter: Payer: Medicare HMO | Admitting: Family Medicine

## 2022-12-23 NOTE — Progress Notes (Signed)
Provider:  Alain Honey, MD  Careteam: Patient Care Team: Wardell Honour, MD as PCP - General (Family Medicine)  PLACE OF SERVICE:  [place]  Advanced Directive information    No Known Allergies  Chief Complaint  Patient presents with   Follow-up    Three Month Follow-up and foot exam today   Quality Metric Gaps    Needs to discuss Eye and Foot Exam, Diabetic Kidney Evaluation, DTAP, Covid vaccine and Hemoglobin A1C     HPI: Patient is a 77 y.o. male   Review of Systems:  ROS  Past Medical History:  Diagnosis Date   Abnormal CT of the chest 2009   nodule 9 x 6 mm RUL   Benign localized hyperplasia of prostate with urinary obstruction and other lower urinary tract symptoms (LUTS)(600.21)    Cardiomegaly    Cerebrovascular accident (stroke) ([place]) 2009   lacunar infarct in the left thalamus   Cholelithiasis    Chronic airway obstruction, not elsewhere classified    Chronic kidney disease, stage III (moderate) ([place])    Closed femur fracture ([place]) 1960   Contact with or exposure to viral disease    suspected    Illiterate    Obesity, unspecified    Other and unspecified hyperlipidemia    Special screening examination for respiratory tuberculosis    Tobacco use disorder    Tuberculosis    Type II or unspecified type diabetes mellitus with renal manifestations, uncontrolled(250.42)    Unspecified essential hypertension    Past Surgical History:  Procedure Laterality Date   FEMUR FRACTURE SURGERY Left 1960   Social History:   reports that he has been smoking cigarettes. He has a 30.00 pack-year smoking history. He has never used smokeless tobacco. He reports that he does not currently use alcohol. He reports that he does not use drugs.  Family History  Problem Relation Age of Onset   Heart disease Mother     Medications: Patient's Medications  New Prescriptions   No medications on file  Previous Medications   ABRYSVO 120 MCG/0.5ML INJECTION        ALBUTEROL (VENTOLIN HFA) 108 (90 BASE) MCG/ACT INHALER    INHALE 1 TO 2 PUFFS BY MOUTH EVERY 4 TO 6 HOURS AS NEEDED FOR BREATHING   ASPIRIN EC 81 MG TABLET    Take 1 tablet (81 mg total) by mouth daily.   BUPROPION (WELLBUTRIN XL) 150 MG 24 HR TABLET    TAKE 1 TABLET(150 MG) BY MOUTH DAILY   DAPAGLIFLOZIN PROPANEDIOL (FARXIGA) 5 MG TABS TABLET    Take 1 tablet (5 mg total) by mouth daily before breakfast.   FERROUS SULFATE (FEROSUL) 325 (65 FE) MG TABLET    Take 1 tablet (325 mg total) by mouth 2 (two) times daily with a meal.   FLUTICASONE-SALMETEROL (ADVAIR) 250-50 MCG/ACT AEPB    Inhale 1 puff into the lungs in the morning and at bedtime.   FUROSEMIDE (LASIX) 20 MG TABLET    TAKE 1 TABLET(20 MG) BY MOUTH DAILY   GLIMEPIRIDE (AMARYL) 1 MG TABLET    Take 1 tablet (1 mg total) by mouth daily with breakfast.   LISINOPRIL (ZESTRIL) 2.5 MG TABLET    Take 1 tablet (2.5 mg total) by mouth daily.   LOVASTATIN (MEVACOR) 20 MG TABLET    Take 1 tablet (20 mg total) by mouth at bedtime.   METOPROLOL SUCCINATE (TOPROL XL) 25 MG 24 HR TABLET    Take 1 tablet (25 mg  total) by mouth daily.   TAMSULOSIN (FLOMAX) 0.4 MG CAPS CAPSULE    Take 1 capsule (0.4 mg total) by mouth daily. Take with food  Modified Medications   No medications on file  Discontinued Medications   FLUAD QUADRIVALENT 0.5 ML INJECTION        Physical Exam:  There were no vitals filed for this visit. There is no height or weight on file to calculate BMI. Wt Readings from Last 3 Encounters:  09/16/22 161 lb (73 kg)  03/17/22 147 lb 9.6 oz (67 kg)  10/21/21 161 lb 6.4 oz (73.2 kg)    Physical Exam  Labs reviewed: Basic Metabolic Panel: Recent Labs    03/17/22 1427  NA 140  K 4.8  CL 103  CO2 29  GLUCOSE 74  BUN 41*  CREATININE 2.02*  CALCIUM 9.5   Liver Function Tests: No results for input(s): "AST", "ALT", "ALKPHOS", "BILITOT", "PROT", "ALBUMIN" in the last 8760 hours. No results for input(s): "LIPASE", "AMYLASE" in  the last 8760 hours. No results for input(s): "AMMONIA" in the last 8760 hours. CBC: No results for input(s): "WBC", "NEUTROABS", "HGB", "HCT", "MCV", "PLT" in the last 8760 hours. Lipid Panel: No results for input(s): "CHOL", "HDL", "LDLCALC", "TRIG", "CHOLHDL", "LDLDIRECT" in the last 8760 hours. TSH: No results for input(s): "TSH" in the last 8760 hours. A1C: Lab Results  Component Value Date   HGBA1C 5.4 03/17/2022     Assessment/Plan    Alain Honey, MD [place] [phone]

## 2022-12-30 NOTE — Progress Notes (Signed)
This encounter was created in error - please disregard.

## 2023-01-06 ENCOUNTER — Ambulatory Visit (INDEPENDENT_AMBULATORY_CARE_PROVIDER_SITE_OTHER): Payer: Medicare HMO | Admitting: Family Medicine

## 2023-01-06 ENCOUNTER — Encounter: Payer: Self-pay | Admitting: Family Medicine

## 2023-01-06 VITALS — BP 120/80 | HR 93 | Temp 97.5°F | Resp 18 | Ht 66.0 in | Wt 159.5 lb

## 2023-01-06 DIAGNOSIS — R0609 Other forms of dyspnea: Secondary | ICD-10-CM

## 2023-01-06 NOTE — Progress Notes (Signed)
Provider:  Alain Honey, MD  Careteam: Patient Care Team: Wardell Honour, MD as PCP - General (Family Medicine)  PLACE OF SERVICE:  [place] Directive information    No Known Allergies  Chief Complaint  Patient presents with   Follow-up    Three Month Follow-up   Quality Metric Gaps    Needs to discuss eye and foot exam, Diabetic kidney evaluation Urine ACR, Hemoglobin A1C, Shingrix, Covid, and Tdap vaccine.     HPI: Patient is a 77 y.o. male patient comes in today with chief complaint of dyspnea on exertion.  He has a past history of tuberculosis but he also has many pack years of smoking history.  He has albuterol as well as Advair inhalers but gets short of breath with minimal exertion.  He had a recent CT scan to rule out lung cancer given his smoking history.  He continues to smoke, 1 pack every 3 days.  Review of Systems:  Review of Systems  Constitutional: Negative.   HENT: Negative.    Eyes: Negative.   Respiratory:  Positive for cough and shortness of breath.   Cardiovascular: Negative.   Genitourinary: Negative.   Musculoskeletal: Negative.   Neurological: Negative.   Psychiatric/Behavioral: Negative.      Past Medical History:  Diagnosis Date   Abnormal CT of the chest 2009   nodule 9 x 6 mm RUL   Benign localized hyperplasia of prostate with urinary obstruction and other lower urinary tract symptoms (LUTS)(600.21)    Cardiomegaly    Cerebrovascular accident (stroke) 2009   lacunar infarct in the left thalamus   Cholelithiasis    Chronic airway obstruction, not elsewhere classified    Chronic kidney disease, stage III (moderate)    Closed femur fracture 1960   Contact with or exposure to viral disease    suspected    Illiterate    Obesity, unspecified    Other and unspecified hyperlipidemia    Special screening examination for respiratory tuberculosis    Tobacco use disorder    Tuberculosis    Type II or unspecified type  diabetes mellitus with renal manifestations, uncontrolled(250.42)    Unspecified essential hypertension    Past Surgical History:  Procedure Laterality Date   FEMUR FRACTURE SURGERY Left 1960   Social History:   reports that he has been smoking cigarettes. He has a 30.00 pack-year smoking history. He has never used smokeless tobacco. He reports that he does not currently use alcohol. He reports that he does not use drugs.  Family History  Problem Relation Age of Onset   Heart disease Mother     Medications: Patient's Medications  New Prescriptions   No medications on file  Previous Medications   ABRYSVO 120 MCG/0.5ML INJECTION       ALBUTEROL (VENTOLIN HFA) 108 (90 BASE) MCG/ACT INHALER    INHALE 1 TO 2 PUFFS BY MOUTH EVERY 4 TO 6 HOURS AS NEEDED FOR BREATHING   ASPIRIN EC 81 MG TABLET    Take 1 tablet (81 mg total) by mouth daily.   BUPROPION (WELLBUTRIN XL) 150 MG 24 HR TABLET    TAKE 1 TABLET(150 MG) BY MOUTH DAILY   DAPAGLIFLOZIN PROPANEDIOL (FARXIGA) 5 MG TABS TABLET    Take 1 tablet (5 mg total) by mouth daily before breakfast.   FERROUS SULFATE (FEROSUL) 325 (65 FE) MG TABLET    Take 1 tablet (325 mg total) by mouth 2 (two) times daily with a meal.  FLUTICASONE-SALMETEROL (ADVAIR) 250-50 MCG/ACT AEPB    Inhale 1 puff into the lungs in the morning and at bedtime.   FUROSEMIDE (LASIX) 20 MG TABLET    TAKE 1 TABLET(20 MG) BY MOUTH DAILY   GLIMEPIRIDE (AMARYL) 1 MG TABLET    Take 1 tablet (1 mg total) by mouth daily with breakfast.   LISINOPRIL (ZESTRIL) 2.5 MG TABLET    Take 1 tablet (2.5 mg total) by mouth daily.   LOVASTATIN (MEVACOR) 20 MG TABLET    Take 1 tablet (20 mg total) by mouth at bedtime.   METOPROLOL SUCCINATE (TOPROL XL) 25 MG 24 HR TABLET    Take 1 tablet (25 mg total) by mouth daily.   TAMSULOSIN (FLOMAX) 0.4 MG CAPS CAPSULE    Take 1 capsule (0.4 mg total) by mouth daily. Take with food  Modified Medications   No medications on file  Discontinued Medications    No medications on file    Physical Exam:  Vitals:   01/06/23 1522  BP: 120/80  Pulse: 93  Resp: 18  Temp: (!) 97.5 F (36.4 C)  SpO2: 90%  Weight: 159 lb 8 oz (72.3 kg)  Height: 5\' 6"  (1.676 m)   Body mass index is 25.74 kg/m. Wt Readings from Last 3 Encounters:  01/06/23 159 lb 8 oz (72.3 kg)  09/16/22 161 lb (73 kg)  03/17/22 147 lb 9.6 oz (67 kg)    Physical Exam Vitals and nursing note reviewed.  Constitutional:      Appearance: Normal appearance.  HENT:     Head: Normocephalic.     Mouth/Throat:     Mouth: Mucous membranes are moist.     Pharynx: Oropharynx is clear.  Cardiovascular:     Rate and Rhythm: Regular rhythm. Tachycardia present.  Pulmonary:     Effort: Pulmonary effort is normal.     Breath sounds: Wheezing present.     Comments: Breath sounds are decreased throughout Neurological:     General: No focal deficit present.     Mental Status: He is alert and oriented to person, place, and time.     Labs reviewed: Basic Metabolic Panel: Recent Labs    03/17/22 1427  NA 140  K 4.8  CL 103  CO2 29  GLUCOSE 74  BUN 41*  CREATININE 2.02*  CALCIUM 9.5   Liver Function Tests: No results for input(s): "AST", "ALT", "ALKPHOS", "BILITOT", "PROT", "ALBUMIN" in the last 8760 hours. No results for input(s): "LIPASE", "AMYLASE" in the last 8760 hours. No results for input(s): "AMMONIA" in the last 8760 hours. CBC: No results for input(s): "WBC", "NEUTROABS", "HGB", "HCT", "MCV", "PLT" in the last 8760 hours. Lipid Panel: No results for input(s): "CHOL", "HDL", "LDLCALC", "TRIG", "CHOLHDL", "LDLDIRECT" in the last 8760 hours. TSH: No results for input(s): "TSH" in the last 8760 hours. A1C: Lab Results  Component Value Date   HGBA1C 5.4 03/17/2022     Assessment/Plan  1. Dyspnea on exertion Patient has known COPD as well as past history of tuberculosis with cavitation seen on screening chest CT.  I walked him and the hall resting O2 sat  was 90% and after walking in the hall slightly declined slightly to 89%.  I gave him a sample of Spiriva.  Will see him back in 1 month to see if this helped if not refer him for potential oxygen therapy    Alain Honey, MD [place] [phone]

## 2023-01-15 ENCOUNTER — Other Ambulatory Visit: Payer: Self-pay | Admitting: Pharmacist

## 2023-01-15 NOTE — Progress Notes (Signed)
01/15/2023 Name: Clarence Maldonado MRN: [id number] DOB: March 15, 1946   Clarence Maldonado is a 77 y.o. year old male who presented for a telephone visit. Donnamae Jude, helps with patient's medical care and answered the phone.   They were referred to the pharmacist by a quality report for assistance in managing  quality metrics: med adherence cholesterol (MAC), and med adherence hypertension [facility]) .   Subjective:  Care Team: Primary Care Provider: Frederica Kuster, MD   Medication Access/Adherence  Current Pharmacy:  [facility] [address] - [place], [place] - [phone] [place] [address] Phone: [phone] Fax: [phone]  [id number] [address] - [place], [place] - [address] [place] [address] Phone: [phone] Fax: [phone]  [id number] - [address], [place] - [address] [facility] [address] [place] [address] Phone: [phone] Fax: [phone]    Hypertension:  Current medications: lisinopril 2.5mg  daily, metoprolol XL 25mg  daily  Patient has a validated, automated, upper arm home BP cuff Current blood pressure readings readings: 115/60  Friend states that patient does not consistently take his blood pressure medications when BP runs low. Checking pressures daily. Friend states "it is often low"   Hyperlipidemia/ASCVD Risk Reduction  Current lipid lowering medications: lovastatin 20mg  daily  Friend reports patient is not currently taking lovastatin. When asked if there are any barriers or reasons why, and pharmacist is simply asking to offer support, the friend could not provide an answer.   Objective:  Lab Results  Component Value Date   HGBA1C 5.4 03/17/2022    Lab Results  Component Value Date   CREATININE 2.02 (H) 03/17/2022   BUN 41 (H) 03/17/2022   NA 140 03/17/2022   K 4.8  03/17/2022   CL 103 03/17/2022   CO2 29 03/17/2022    Lab Results  Component Value Date   CHOL 179 10/21/2021   HDL 45 10/21/2021   LDLCALC 100 (H) 10/21/2021   TRIG 220 (H) 10/21/2021   CHOLHDL 4.0 10/21/2021    Medications Reviewed Today     Reviewed by Clarence Maldonado, RPH (Pharmacist) on 01/15/23 at 1449  Med List Status: <None>   Medication Order Taking? Sig Documenting Provider Last Dose Status Informant  ABRYSVO 120 MCG/0.5ML injection [id number] No  [provider] Taking Active   albuterol (VENTOLIN HFA) 108 (90 Base) MCG/ACT inhaler [id number] No INHALE 1 TO 2 PUFFS BY MOUTH EVERY 4 TO 6 HOURS AS NEEDED FOR BREATHING Frederica Kuster, MD Taking Active   aspirin EC 81 MG tablet [id number] No Take 1 tablet (81 mg total) by mouth daily. Reed, Tiffany L, DO Taking Active   buPROPion (WELLBUTRIN XL) 150 MG 24 hr tablet [id number] No TAKE 1 TABLET(150 MG) BY MOUTH DAILY Frederica Kuster, MD Taking Active   dapagliflozin propanediol (FARXIGA) 5 MG TABS tablet [id number] No Take 1 tablet (5 mg total) by mouth daily before breakfast. Frederica Kuster, MD Taking Active   ferrous sulfate (FEROSUL) 325 (65 FE) MG tablet [id number] No Take 1 tablet (325 mg total) by mouth 2 (two) times daily with a meal. Frederica Kuster, MD Taking Active   fluticasone-salmeterol (ADVAIR) 250-50 MCG/ACT AEPB [id number] No Inhale 1 puff into the lungs in the morning and at bedtime. Frederica Kuster, MD Taking Active   furosemide (LASIX) 20 MG tablet [id number] No TAKE 1 TABLET(20 MG) BY MOUTH DAILY Frederica Kuster,  MD Taking Active   glimepiride (AMARYL) 1 MG tablet [id number] No Take 1 tablet (1 mg total) by mouth daily with breakfast. Frederica Kuster, MD Taking Active   lisinopril (ZESTRIL) 2.5 MG tablet [id number] No Take 1 tablet (2.5 mg total) by mouth daily. Frederica Kuster, MD Taking Expired 12/26/22 2359   lovastatin (MEVACOR) 20 MG tablet [id number] No Take 1 tablet (20 mg total) by mouth  at bedtime. Frederica Kuster, MD Taking Active   metoprolol succinate (TOPROL XL) 25 MG 24 hr tablet [id number] No Take 1 tablet (25 mg total) by mouth daily. Ngetich, [name] C, NP Taking Active   tamsulosin (FLOMAX) 0.4 MG CAPS capsule [id number] No Take 1 capsule (0.4 mg total) by mouth daily. Take with food Frederica Kuster, MD Taking Active               Assessment/Plan:   Hypertension: - Currently controlled, reviewed fill history, medication use is inconsistent and patient is intentional with this usage pattern due to low pressures. - Recommend to streamline medication burden, and discontinue lisinopril. At very low dose and inconsistent use, likely not providing any benefit from hypertension or from a renal protection standpoint.     Hyperlipidemia/ASCVD Risk Reduction: - Currently controlled, reviewed fill history, along with info from patient's friend, likely very inconsistent use. - Provided counseling on importance of consistent medication use  - Recommend to continue current regimen.  Clarence Maldonado, PharmD, BCPS Clinical Pharmacist [facility]

## 2023-01-19 ENCOUNTER — Telehealth: Payer: Self-pay

## 2023-01-19 NOTE — Telephone Encounter (Signed)
[name] called regarding prescription for patient. He says that patient was given sample of spiriva and it worked well. He would like to have prescription sent to pharmacy.   Message routed to Dr. Jacalyn Lefevre

## 2023-01-20 ENCOUNTER — Other Ambulatory Visit: Payer: Self-pay | Admitting: Family Medicine

## 2023-01-20 MED ORDER — SPIRIVA RESPIMAT 1.25 MCG/ACT IN AERS: 2.0000 | INHALATION_SPRAY | Freq: Every day | RESPIRATORY_TRACT | 2 refills | Status: DC

## 2023-01-22 ENCOUNTER — Other Ambulatory Visit: Payer: Self-pay

## 2023-01-22 DIAGNOSIS — E1169 Type 2 diabetes mellitus with other specified complication: Secondary | ICD-10-CM

## 2023-01-22 MED ORDER — LOVASTATIN 20 MG PO TABS: 20.0000 mg | ORAL_TABLET | Freq: Every day | ORAL | 1 refills | Status: DC

## 2023-02-17 ENCOUNTER — Ambulatory Visit (INDEPENDENT_AMBULATORY_CARE_PROVIDER_SITE_OTHER): Payer: Medicare HMO | Admitting: Family Medicine

## 2023-02-17 ENCOUNTER — Encounter: Payer: Self-pay | Admitting: Family Medicine

## 2023-02-17 VITALS — BP 122/78 | HR 88 | Temp 98.0°F | Ht 66.0 in | Wt 155.6 lb

## 2023-02-17 DIAGNOSIS — N400 Enlarged prostate without lower urinary tract symptoms: Secondary | ICD-10-CM | POA: Diagnosis not present

## 2023-02-17 DIAGNOSIS — E1169 Type 2 diabetes mellitus with other specified complication: Secondary | ICD-10-CM

## 2023-02-17 DIAGNOSIS — E1122 Type 2 diabetes mellitus with diabetic chronic kidney disease: Secondary | ICD-10-CM

## 2023-02-17 DIAGNOSIS — I10 Essential (primary) hypertension: Secondary | ICD-10-CM

## 2023-02-17 DIAGNOSIS — N1832 Chronic kidney disease, stage 3b: Secondary | ICD-10-CM

## 2023-02-17 DIAGNOSIS — R0609 Other forms of dyspnea: Secondary | ICD-10-CM

## 2023-02-17 DIAGNOSIS — E785 Hyperlipidemia, unspecified: Secondary | ICD-10-CM

## 2023-02-17 DIAGNOSIS — N183 Chronic kidney disease, stage 3 unspecified: Secondary | ICD-10-CM

## 2023-02-17 DIAGNOSIS — I700 Atherosclerosis of aorta: Secondary | ICD-10-CM | POA: Insufficient documentation

## 2023-02-17 NOTE — Progress Notes (Addendum)
Provider:  Jacalyn Lefevre, MD  Careteam: Patient Care Team: Frederica Kuster, MD as PCP - General (Family Medicine)  PLACE OF SERVICE:  [facility]  Advanced Directive information    No Known Allergies  Chief Complaint  Patient presents with   Medical Management of Chronic Issues    Patient presents today for a 1 month follow-up   Quality Metric Gaps    Eye & foot exam,microalbumin, A1C, TDAP, zoster, COVID#4     HPI: Patient is a 77 y.o. male patient returns to follow-up COPD and diabetes.  At his last visit 1 month ago he was given a sample of Tia Tropium bromide he has run out of that medication but felt like it was effective more so than the previous Advair.  We reviewed his medicines today.  He takes Amaryl and dapagliflozinfordiabetes.  A1c's have been very good previous one was about a year ago was 5.4.  He continues to cough and smoke but not as much as before.  In the office today he has what sounds like a productive cough but he rarely brings up any sputum.  He is accompanied by his longtime friend, [name] who counted out his pills for him in an organizer while we were doing exam  Review of Systems:  Review of Systems  Constitutional: Negative.   HENT:  Positive for hearing loss.   Respiratory:  Positive for cough and sputum production.   Cardiovascular: Negative.   Genitourinary:  Positive for urgency.  Neurological: Negative.   Psychiatric/Behavioral: Negative.    All other systems reviewed and are negative.   Past Medical History:  Diagnosis Date   Abnormal CT of the chest 2009   nodule 9 x 6 mm RUL   Benign localized hyperplasia of prostate with urinary obstruction and other lower urinary tract symptoms (LUTS)(600.21)    Cardiomegaly    Cerebrovascular accident (stroke) (HCC) 2009   lacunar infarct in the left thalamus   Cholelithiasis    Chronic airway obstruction, not elsewhere classified    Chronic kidney disease, stage III (moderate) (HCC)     Closed femur fracture (HCC) 1960   Contact with or exposure to viral disease    suspected    Illiterate    Obesity, unspecified    Other and unspecified hyperlipidemia    Special screening examination for respiratory tuberculosis    Tobacco use disorder    Tuberculosis    Type II or unspecified type diabetes mellitus with renal manifestations, uncontrolled(250.42)    Unspecified essential hypertension    Past Surgical History:  Procedure Laterality Date   FEMUR FRACTURE SURGERY Left 1960   Social History:   reports that he has been smoking cigarettes. He has a 30.00 pack-year smoking history. He has never used smokeless tobacco. He reports that he does not currently use alcohol. He reports that he does not use drugs.  Family History  Problem Relation Age of Onset   Heart disease Mother     Medications: Patient's Medications  New Prescriptions   No medications on file  Previous Medications   ABRYSVO 120 MCG/0.5ML INJECTION       ALBUTEROL (VENTOLIN HFA) 108 (90 BASE) MCG/ACT INHALER    INHALE 1 TO 2 PUFFS BY MOUTH EVERY 4 TO 6 HOURS AS NEEDED FOR BREATHING   ASPIRIN EC 81 MG TABLET    Take 1 tablet (81 mg total) by mouth daily.   BUPROPION (WELLBUTRIN XL) 150 MG 24 HR TABLET  TAKE 1 TABLET(150 MG) BY MOUTH DAILY   DAPAGLIFLOZIN PROPANEDIOL (FARXIGA) 5 MG TABS TABLET    Take 1 tablet (5 mg total) by mouth daily before breakfast.   FERROUS SULFATE (FEROSUL) 325 (65 FE) MG TABLET    Take 1 tablet (325 mg total) by mouth 2 (two) times daily with a meal.   FLUTICASONE-SALMETEROL (ADVAIR) 250-50 MCG/ACT AEPB    Inhale 1 puff into the lungs in the morning and at bedtime.   FUROSEMIDE (LASIX) 20 MG TABLET    TAKE 1 TABLET(20 MG) BY MOUTH DAILY   GLIMEPIRIDE (AMARYL) 1 MG TABLET    Take 1 tablet (1 mg total) by mouth daily with breakfast.   LISINOPRIL (ZESTRIL) 2.5 MG TABLET    Take 1 tablet (2.5 mg total) by mouth daily.   LOVASTATIN (MEVACOR) 20 MG TABLET    Take 1 tablet (20 mg  total) by mouth at bedtime.   METOPROLOL SUCCINATE (TOPROL XL) 25 MG 24 HR TABLET    Take 1 tablet (25 mg total) by mouth daily.   TAMSULOSIN (FLOMAX) 0.4 MG CAPS CAPSULE    Take 1 capsule (0.4 mg total) by mouth daily. Take with food   TIOTROPIUM BROMIDE MONOHYDRATE (SPIRIVA RESPIMAT) 1.25 MCG/ACT AERS    Inhale 2 puffs into the lungs daily.  Modified Medications   No medications on file  Discontinued Medications   No medications on file    Physical Exam:  Vitals:   02/17/23 1439  BP: 122/78  Pulse: 88  Temp: 98 F (36.7 C)  SpO2: 92%  Weight: 155 lb 9.6 oz (70.6 kg)  Height: 5\' 6"  (1.676 m)   Body mass index is 25.11 kg/m. Wt Readings from Last 3 Encounters:  02/17/23 155 lb 9.6 oz (70.6 kg)  01/06/23 159 lb 8 oz (72.3 kg)  09/16/22 161 lb (73 kg)    Physical Exam Vitals and nursing note reviewed.  Constitutional:      Appearance: Normal appearance.  HENT:     Head: Normocephalic.  Cardiovascular:     Rate and Rhythm: Normal rate and regular rhythm.  Pulmonary:     Effort: Pulmonary effort is normal.     Breath sounds: Wheezing and rhonchi present.  Abdominal:     General: Bowel sounds are normal.     Palpations: Abdomen is soft.  Musculoskeletal:        General: Normal range of motion.  Neurological:     General: No focal deficit present.     Mental Status: He is alert and oriented to person, place, and time.     Labs reviewed: Basic Metabolic Panel: Recent Labs    03/17/22 1427  NA 140  K 4.8  CL 103  CO2 29  GLUCOSE 74  BUN 41*  CREATININE 2.02*  CALCIUM 9.5   Liver Function Tests: No results for input(s): "AST", "ALT", "ALKPHOS", "BILITOT", "PROT", "ALBUMIN" in the last 8760 hours. No results for input(s): "LIPASE", "AMYLASE" in the last 8760 hours. No results for input(s): "AMMONIA" in the last 8760 hours. CBC: No results for input(s): "WBC", "NEUTROABS", "HGB", "HCT", "MCV", "PLT" in the last 8760 hours. Lipid Panel: No results for  input(s): "CHOL", "HDL", "LDLCALC", "TRIG", "CHOLHDL", "LDLDIRECT" in the last 8760 hours. TSH: No results for input(s): "TSH" in the last 8760 hours. A1C: Lab Results  Component Value Date   HGBA1C 5.4 03/17/2022     Assessment/Plan  1. Controlled type 2 diabetes mellitus with stage 3 chronic kidney disease, without long-term current  use of insulin (HCC) Continues on combination regimen and A1c's have been very good  2. Essential hypertension Takes low-dose lisinopril more for renal protective effects of metoprolol  3. BPH without obstruction/lower urinary tract symptoms Describes some decrease in strength caliber of urinary stream as well as nocturia likely related to enlarged prostate  4. Stage 3b chronic kidney disease (HCC) Blood pressure and diabetes have been well-controlled will reassess kidney function today  5. Dyspnea on exertion Tia Tropium bromide helped we will refill again with another sample  6. Hyperlipidemia associated with type 2 diabetes mellitus (HCC) Lipids have not been assessed in about a year and a half I will do so today  7. Aortic atherosclerosis (HCC) Continue with statin, blood pressure meds and diabetic treatment   7. Aortic atherosclerosis (HCC)    Jacalyn Lefevre, MD [facility] [phone]

## 2023-02-18 LAB — BASIC METABOLIC PANEL WITH GFR
BUN/Creatinine Ratio: 16 (calc) (ref 6–22)
BUN: 28 mg/dL — ABNORMAL HIGH (ref 7–25)
CO2: 25 mmol/L (ref 20–32)
Calcium: 9.3 mg/dL (ref 8.6–10.3)
Chloride: 102 mmol/L (ref 98–110)
Creat: 1.75 mg/dL — ABNORMAL HIGH (ref 0.70–1.28)
Glucose, Bld: 90 mg/dL (ref 65–99)
Potassium: 4.4 mmol/L (ref 3.5–5.3)
Sodium: 144 mmol/L (ref 135–146)
eGFR: 40 mL/min/{1.73_m2} — ABNORMAL LOW (ref >=60)

## 2023-02-18 LAB — LIPID PANEL
Cholesterol: 144 mg/dL (ref <200)
HDL: 37 mg/dL — ABNORMAL LOW (ref >=40)
LDL Cholesterol (Calc): 81 mg/dL (calc)
Non-HDL Cholesterol (Calc): 107 mg/dL (calc) (ref <130)
Total CHOL/HDL Ratio: 3.9 (calc) (ref <5.0)
Triglycerides: 162 mg/dL — ABNORMAL HIGH (ref <150)

## 2023-02-18 LAB — HEMOGLOBIN A1C
Hgb A1c MFr Bld: 5.9 % of total Hgb — ABNORMAL HIGH (ref <5.7)
Mean Plasma Glucose: 123 mg/dL
eAG (mmol/L): 6.8 mmol/L

## 2023-02-18 LAB — MICROALBUMIN / CREATININE URINE RATIO
Creatinine, Urine: 139 mg/dL (ref 20–320)
Microalb Creat Ratio: 285 mg/g creat — ABNORMAL HIGH (ref <30)
Microalb, Ur: 39.6 mg/dL

## 2023-03-08 ENCOUNTER — Other Ambulatory Visit: Payer: Self-pay

## 2023-03-08 MED ORDER — FLUTICASONE-SALMETEROL 250-50 MCG/ACT IN AEPB: 1.0000 | INHALATION_SPRAY | Freq: Two times a day (BID) | RESPIRATORY_TRACT | 5 refills | Status: DC

## 2023-03-17 ENCOUNTER — Other Ambulatory Visit: Payer: Self-pay | Admitting: Family

## 2023-03-17 ENCOUNTER — Other Ambulatory Visit: Payer: Self-pay

## 2023-03-17 MED ORDER — DAPAGLIFLOZIN PROPANEDIOL 5 MG PO TABS: 5.0000 mg | ORAL_TABLET | Freq: Every day | ORAL | 3 refills | Status: DC

## 2023-03-17 NOTE — Telephone Encounter (Signed)
Patient medication has High Risk Warnings. Medication pend and sent to PCP Frederica Kuster, MD.

## 2023-04-06 ENCOUNTER — Other Ambulatory Visit: Payer: Self-pay

## 2023-04-06 MED ORDER — FERROUS SULFATE 325 (65 FE) MG PO TABS: 325.0000 mg | ORAL_TABLET | Freq: Two times a day (BID) | ORAL | 5 refills | Status: AC

## 2023-05-14 ENCOUNTER — Encounter: Payer: Self-pay | Admitting: Family Medicine

## 2023-05-17 ENCOUNTER — Encounter: Payer: Self-pay | Admitting: Family Medicine

## 2023-05-18 ENCOUNTER — Encounter: Payer: Self-pay | Admitting: Family Medicine

## 2023-05-24 ENCOUNTER — Other Ambulatory Visit: Payer: Self-pay

## 2023-05-24 DIAGNOSIS — Z716 Tobacco abuse counseling: Secondary | ICD-10-CM

## 2023-05-24 DIAGNOSIS — F17200 Nicotine dependence, unspecified, uncomplicated: Secondary | ICD-10-CM

## 2023-05-24 MED ORDER — BUPROPION HCL ER (XL) 150 MG PO TB24
ORAL_TABLET | ORAL | 1 refills | Status: DC
Start: 2023-05-24 — End: 2024-06-09

## 2023-05-24 NOTE — Telephone Encounter (Signed)
High warning came up when trying to refill medication  Medication pended and sent to Sherrie Mustache, NP

## 2023-05-25 ENCOUNTER — Other Ambulatory Visit: Payer: Self-pay

## 2023-05-25 DIAGNOSIS — N183 Chronic kidney disease, stage 3 unspecified: Secondary | ICD-10-CM

## 2023-05-25 MED ORDER — GLIMEPIRIDE 1 MG PO TABS
1.0000 mg | ORAL_TABLET | Freq: Every day | ORAL | 1 refills | Status: DC
Start: 2023-05-25 — End: 2024-05-13

## 2023-06-23 ENCOUNTER — Ambulatory Visit: Payer: Medicare HMO | Admitting: Family Medicine

## 2023-06-24 ENCOUNTER — Ambulatory Visit (INDEPENDENT_AMBULATORY_CARE_PROVIDER_SITE_OTHER): Payer: Medicare HMO | Admitting: Orthopedic Surgery

## 2023-06-24 ENCOUNTER — Encounter: Payer: Self-pay | Admitting: Orthopedic Surgery

## 2023-06-24 VITALS — BP 112/70 | HR 80 | Temp 98.2°F | Resp 18 | Ht 66.0 in | Wt 155.4 lb

## 2023-06-24 DIAGNOSIS — Z8611 Personal history of tuberculosis: Secondary | ICD-10-CM | POA: Diagnosis not present

## 2023-06-24 DIAGNOSIS — A150 Tuberculosis of lung: Secondary | ICD-10-CM

## 2023-06-24 DIAGNOSIS — I428 Other cardiomyopathies: Secondary | ICD-10-CM

## 2023-06-24 DIAGNOSIS — N183 Chronic kidney disease, stage 3 unspecified: Secondary | ICD-10-CM | POA: Diagnosis not present

## 2023-06-24 DIAGNOSIS — I10 Essential (primary) hypertension: Secondary | ICD-10-CM | POA: Diagnosis not present

## 2023-06-24 DIAGNOSIS — R0602 Shortness of breath: Secondary | ICD-10-CM | POA: Diagnosis not present

## 2023-06-24 DIAGNOSIS — E1122 Type 2 diabetes mellitus with diabetic chronic kidney disease: Secondary | ICD-10-CM | POA: Diagnosis not present

## 2023-06-24 DIAGNOSIS — F17200 Nicotine dependence, unspecified, uncomplicated: Secondary | ICD-10-CM | POA: Diagnosis not present

## 2023-06-24 DIAGNOSIS — J449 Chronic obstructive pulmonary disease, unspecified: Secondary | ICD-10-CM

## 2023-06-24 MED ORDER — ALBUTEROL SULFATE HFA 108 (90 BASE) MCG/ACT IN AERS
INHALATION_SPRAY | RESPIRATORY_TRACT | 11 refills | Status: DC
Start: 2023-06-24 — End: 2023-09-23

## 2023-06-24 NOTE — Patient Instructions (Addendum)
Try to cut down smoking   Plan to schedule CT chest to rule out lung cancer sometime after January  I refilled inhalers> please start to retake them

## 2023-06-24 NOTE — Progress Notes (Signed)
Careteam: Patient Care Team: Clarence Heir, NP as PCP - General (Adult Health Nurse Practitioner)  Seen by: Hazle Nordmann, AGNP-C  PLACE OF SERVICE:  [facility]  Advanced Directive information Does Patient Have a Medical Advance Directive?: No  No Known Allergies  Chief Complaint  Patient presents with   Follow-up    4 month follow up - having low BP and shortness of breath on exertion - stopped spiriva because he was taking too much daily - out of albuterol.     HPI: Patient is a 77 y.o. male seen today for medical management of chronic conditions.   Caregiver, [name], present during encounter. He continues to help with weekly medication management with pill box.   He c/o increased shortness of breath with exertion. Apparently he ran out of albuterol for a few days. He continues to smoke about 1/2 PPD, sometimes more. Taking Advair as prescribed. No plans to quit smoking. CT chest 10/2022> no malignancy.   T2DM- does not check sugars, no hypoglycemias, remains on Farxiga and glimepiride, last eye exam unknown.   Followed by cardiology 2021, but he never returned. Appears he seen for systolic heart failure/ CAD/nonischemic cardiomyopathy. Past stress test showed silent MI, EF 35%.   No recent falls or injuries.   Weights stable.         Review of Systems:  Review of Systems  Constitutional: Negative.   HENT: Negative.    Eyes: Negative.   Respiratory:  Positive for cough, sputum production and shortness of breath. Negative for wheezing.   Cardiovascular:  Positive for leg swelling. Negative for chest pain.  Gastrointestinal: Negative.   Genitourinary:  Positive for frequency.  Musculoskeletal:  Negative for falls and joint pain.  Skin: Negative.   Neurological:  Negative for dizziness, weakness and headaches.  Psychiatric/Behavioral:  Positive for depression. The patient is not nervous/anxious and does not have insomnia.     Past Medical History:  Diagnosis  Date   Abnormal CT of the chest 2009   nodule 9 x 6 mm RUL   Benign localized hyperplasia of prostate with urinary obstruction and other lower urinary tract symptoms (LUTS)(600.21)    Cardiomegaly    Cerebrovascular accident (stroke) (HCC) 2009   lacunar infarct in the left thalamus   Cholelithiasis    Chronic airway obstruction, not elsewhere classified    Chronic kidney disease, stage III (moderate) (HCC)    Closed femur fracture (HCC) 1960   Contact with or exposure to viral disease    suspected    Illiterate    Obesity, unspecified    Other and unspecified hyperlipidemia    Special screening examination for respiratory tuberculosis    Tobacco use disorder    Tuberculosis    Type II or unspecified type diabetes mellitus with renal manifestations, uncontrolled(250.42)    Unspecified essential hypertension    Past Surgical History:  Procedure Laterality Date   FEMUR FRACTURE SURGERY Left 1960   Social History:   reports that he has been smoking cigarettes. He has a 30 pack-year smoking history. He has never used smokeless tobacco. He reports that he does not currently use alcohol. He reports that he does not use drugs.  Family History  Problem Relation Age of Onset   Heart disease Mother     Medications: Patient's Medications  New Prescriptions   No medications on file  Previous Medications   ABRYSVO 120 MCG/0.5ML INJECTION       ALBUTEROL (VENTOLIN HFA) 108 (90  BASE) MCG/ACT INHALER    INHALE 1 TO 2 PUFFS BY MOUTH EVERY 4 TO 6 HOURS AS NEEDED FOR BREATHING   ASPIRIN EC 81 MG TABLET    Take 1 tablet (81 mg total) by mouth daily.   BUPROPION (WELLBUTRIN XL) 150 MG 24 HR TABLET    TAKE 1 TABLET(150 MG) BY MOUTH DAILY   DAPAGLIFLOZIN PROPANEDIOL (FARXIGA) 5 MG TABS TABLET    Take 1 tablet (5 mg total) by mouth daily before breakfast.   FERROUS SULFATE (FEROSUL) 325 (65 FE) MG TABLET    Take 1 tablet (325 mg total) by mouth 2 (two) times daily with a meal.    FLUTICASONE-SALMETEROL (ADVAIR) 250-50 MCG/ACT AEPB    Inhale 1 puff into the lungs in the morning and at bedtime.   FUROSEMIDE (LASIX) 20 MG TABLET    TAKE 1 TABLET(20 MG) BY MOUTH DAILY   GLIMEPIRIDE (AMARYL) 1 MG TABLET    Take 1 tablet (1 mg total) by mouth daily with breakfast.   LISINOPRIL (ZESTRIL) 2.5 MG TABLET    Take 1 tablet (2.5 mg total) by mouth daily.   LOVASTATIN (MEVACOR) 20 MG TABLET    Take 1 tablet (20 mg total) by mouth at bedtime.   METOPROLOL SUCCINATE (TOPROL-XL) 25 MG 24 HR TABLET    TAKE 1 TABLET(25 MG) BY MOUTH DAILY   TAMSULOSIN (FLOMAX) 0.4 MG CAPS CAPSULE    Take 1 capsule (0.4 mg total) by mouth daily. Take with food   TIOTROPIUM BROMIDE MONOHYDRATE (SPIRIVA RESPIMAT) 1.25 MCG/ACT AERS    Inhale 2 puffs into the lungs daily.  Modified Medications   No medications on file  Discontinued Medications   No medications on file    Physical Exam:  Vitals:   06/24/23 1504  BP: 112/70  Pulse: (!) 50  Resp: 18  Temp: 98.2 F (36.8 C)  SpO2: 91%  Weight: 155 lb 6.4 oz (70.5 kg)  Height: 5\' 6"  (1.676 m)   Body mass index is 25.08 kg/m. Wt Readings from Last 3 Encounters:  06/24/23 155 lb 6.4 oz (70.5 kg)  02/17/23 155 lb 9.6 oz (70.6 kg)  01/06/23 159 lb 8 oz (72.3 kg)    Physical Exam Vitals reviewed.  Constitutional:      General: He is not in acute distress.    Comments: Strong cigarette odor  HENT:     Head: Normocephalic.  Eyes:     General:        Right eye: No discharge.        Left eye: No discharge.  Cardiovascular:     Rate and Rhythm: Normal rate and regular rhythm.     Pulses:          Dorsalis pedis pulses are 1+ on the right side and 1+ on the left side.       Posterior tibial pulses are 1+ on the right side and 1+ on the left side.     Heart sounds: Normal heart sounds.  Pulmonary:     Effort: Pulmonary effort is normal.     Breath sounds: Normal breath sounds.  Abdominal:     General: Bowel sounds are normal.     Palpations:  Abdomen is soft.  Musculoskeletal:     Cervical back: Neck supple.     Right lower leg: No edema.     Left lower leg: No edema.  Feet:     Right foot:     Protective Sensation: 8 sites tested.  10  sites sensed.     Skin integrity: Dry skin present.     Toenail Condition: Right toenails are abnormally thick. Fungal disease present.    Left foot:     Protective Sensation: 8 sites tested.  10 sites sensed.     Skin integrity: Dry skin present.     Toenail Condition: Left toenails are abnormally thick. Fungal disease present.    Comments: Discoloration to toes> purple Skin:    General: Skin is warm.     Capillary Refill: Capillary refill takes less than 2 seconds.  Neurological:     General: No focal deficit present.     Mental Status: He is alert and oriented to person, place, and time.     Motor: No weakness.     Gait: Gait normal.  Psychiatric:        Mood and Affect: Mood normal.     Labs reviewed: Basic Metabolic Panel: Recent Labs    02/17/23 1514  NA 144  K 4.4  CL 102  CO2 25  GLUCOSE 90  BUN 28*  CREATININE 1.75*  CALCIUM 9.3   Liver Function Tests: No results for input(s): "AST", "ALT", "ALKPHOS", "BILITOT", "PROT", "ALBUMIN" in the last 8760 hours. No results for input(s): "LIPASE", "AMYLASE" in the last 8760 hours. No results for input(s): "AMMONIA" in the last 8760 hours. CBC: No results for input(s): "WBC", "NEUTROABS", "HGB", "HCT", "MCV", "PLT" in the last 8760 hours. Lipid Panel: Recent Labs    02/17/23 1514  CHOL 144  HDL 37*  LDLCALC 81  TRIG 161*  CHOLHDL 3.9   TSH: No results for input(s): "TSH" in the last 8760 hours. A1C: Lab Results  Component Value Date   HGBA1C 5.9 (H) 02/17/2023     Assessment/Plan 1. Tobacco use disorder - discussed cessation> no plans to quit - smoking 1/2 PPD > sometimes more - CT chest r/o lung cancer due 10/2023 - albuterol (VENTOLIN HFA) 108 (90 Base) MCG/ACT inhaler; INHALE 1 TO 2 PUFFS BY MOUTH  EVERY 4 TO 6 HOURS AS NEEDED FOR BREATHING  Dispense: 17 each; Refill: 11  2. Shortness of breath - oxygen saturation 91%> improved 94% at rest - EKG 12-Lead> sinus rhythm/ t-wave abnormality, old anteroseptal infarct - reports increased fatigue with exertion> DOE noted by Dr. Hyacinth Meeker 01/2023 per chart review - suspect related to respiratory diseases over cardiac> h/o TB& COPD - had not been using albuterol as prescribed> ran out - cont Advair and albuterol - may need oxygen in future  3. Essential hypertension - BUN/creat 19/1.92 06/24/2023 - cont lisinopril - Basic Metabolic Panel with eGFR - CBC with Differential/Platelet  4. Controlled type 2 diabetes mellitus with stage 3 chronic kidney disease, without long-term current use of insulin (HCC) - A1c 5.8> was 5.9 - does not check sugars - unknown last eye exam - monofilament 8/10 - continue Farixga and glimepiride - Hemoglobin A1c  5. Nonischemic cardiomyopathy (HCC) - seen by Dr. Sharlot Gowda 03/2020> advised to stop smoking and 4 month f/u> not done - h/o silent MI - LVEF 30-35% 2021 - Ambulatory referral to Cardiology  6. Chronic obstructive pulmonary disease, unspecified COPD type (HCC) - cont Advair and albuterol  7. History of TB (tuberculosis)  Total time: 45 minutes. Greater than 50% of total time spent doing patient education regarding health maintenance, T2DM, DOE, smoking cessation, and medication management.    Next appt: 09/23/2023  Hazle Nordmann, Juel Burrow  [facility] [phone]

## 2023-06-25 ENCOUNTER — Encounter: Payer: Self-pay | Admitting: Orthopedic Surgery

## 2023-06-25 LAB — CBC WITH DIFFERENTIAL/PLATELET
Absolute Monocytes: 815 cells/uL (ref 200–950)
Basophils Absolute: 50 cells/uL (ref 0–200)
Basophils Relative: 0.6 %
Eosinophils Absolute: 403 cells/uL (ref 15–500)
Eosinophils Relative: 4.8 %
HCT: 54.5 % — ABNORMAL HIGH (ref 38.5–50.0)
Hemoglobin: 17.3 g/dL — ABNORMAL HIGH (ref 13.2–17.1)
Lymphs Abs: 1688 cells/uL (ref 850–3900)
MCH: 28.9 pg (ref 27.0–33.0)
MCHC: 31.7 g/dL — ABNORMAL LOW (ref 32.0–36.0)
MCV: 91 fL (ref 80.0–100.0)
MPV: 9.6 fL (ref 7.5–12.5)
Monocytes Relative: 9.7 %
Neutro Abs: 5443 cells/uL (ref 1500–7800)
Neutrophils Relative %: 64.8 %
Platelets: 185 10*3/uL (ref 140–400)
RBC: 5.99 10*6/uL — ABNORMAL HIGH (ref 4.20–5.80)
RDW: 14.2 % (ref 11.0–15.0)
Total Lymphocyte: 20.1 %
WBC: 8.4 10*3/uL (ref 3.8–10.8)

## 2023-06-25 LAB — BASIC METABOLIC PANEL WITH GFR
BUN/Creatinine Ratio: 10 (calc) (ref 6–22)
BUN: 19 mg/dL (ref 7–25)
CO2: 30 mmol/L (ref 20–32)
Calcium: 8.8 mg/dL (ref 8.6–10.3)
Chloride: 103 mmol/L (ref 98–110)
Creat: 1.92 mg/dL — ABNORMAL HIGH (ref 0.70–1.28)
Glucose, Bld: 67 mg/dL (ref 65–99)
Potassium: 3.9 mmol/L (ref 3.5–5.3)
Sodium: 142 mmol/L (ref 135–146)
eGFR: 36 mL/min/{1.73_m2} — ABNORMAL LOW (ref >=60)

## 2023-06-25 LAB — HEMOGLOBIN A1C
Hgb A1c MFr Bld: 5.8 % of total Hgb — ABNORMAL HIGH (ref <5.7)
Mean Plasma Glucose: 120 mg/dL
eAG (mmol/L): 6.6 mmol/L

## 2023-07-12 ENCOUNTER — Other Ambulatory Visit: Payer: Self-pay | Admitting: *Deleted

## 2023-07-12 DIAGNOSIS — R39198 Other difficulties with micturition: Secondary | ICD-10-CM

## 2023-07-12 MED ORDER — TAMSULOSIN HCL 0.4 MG PO CAPS: 0.4000 mg | ORAL_CAPSULE | Freq: Every day | ORAL | 1 refills | Status: AC

## 2023-07-12 NOTE — Telephone Encounter (Signed)
Pharmacy requested refill

## 2023-09-23 ENCOUNTER — Ambulatory Visit (INDEPENDENT_AMBULATORY_CARE_PROVIDER_SITE_OTHER): Payer: Medicare HMO | Admitting: Orthopedic Surgery

## 2023-09-23 ENCOUNTER — Encounter: Payer: Self-pay | Admitting: Orthopedic Surgery

## 2023-09-23 ENCOUNTER — Ambulatory Visit: Payer: Medicare HMO | Admitting: Orthopedic Surgery

## 2023-09-23 VITALS — BP 132/84 | Temp 96.3°F | Resp 16 | Ht 66.0 in | Wt 154.4 lb

## 2023-09-23 DIAGNOSIS — I10 Essential (primary) hypertension: Secondary | ICD-10-CM

## 2023-09-23 DIAGNOSIS — R0609 Other forms of dyspnea: Secondary | ICD-10-CM | POA: Diagnosis not present

## 2023-09-23 DIAGNOSIS — N183 Chronic kidney disease, stage 3 unspecified: Secondary | ICD-10-CM

## 2023-09-23 DIAGNOSIS — F17210 Nicotine dependence, cigarettes, uncomplicated: Secondary | ICD-10-CM | POA: Diagnosis not present

## 2023-09-23 DIAGNOSIS — R413 Other amnesia: Secondary | ICD-10-CM | POA: Diagnosis not present

## 2023-09-23 DIAGNOSIS — R8290 Unspecified abnormal findings in urine: Secondary | ICD-10-CM | POA: Diagnosis not present

## 2023-09-23 DIAGNOSIS — R7303 Prediabetes: Secondary | ICD-10-CM | POA: Diagnosis not present

## 2023-09-23 DIAGNOSIS — E1122 Type 2 diabetes mellitus with diabetic chronic kidney disease: Secondary | ICD-10-CM

## 2023-09-23 MED ORDER — ALBUTEROL SULFATE HFA 108 (90 BASE) MCG/ACT IN AERS
INHALATION_SPRAY | RESPIRATORY_TRACT | 11 refills | Status: DC
Start: 2023-09-23 — End: 2024-05-13

## 2023-09-23 MED ORDER — SPIRIVA RESPIMAT 1.25 MCG/ACT IN AERS
2.0000 | INHALATION_SPRAY | Freq: Every day | RESPIRATORY_TRACT | 2 refills | Status: DC
Start: 2023-09-23 — End: 2024-06-09

## 2023-09-23 MED ORDER — FLUTICASONE-SALMETEROL 250-50 MCG/ACT IN AEPB
1.0000 | INHALATION_SPRAY | Freq: Two times a day (BID) | RESPIRATORY_TRACT | 5 refills | Status: DC
Start: 2023-09-23 — End: 2024-06-09

## 2023-09-23 NOTE — Patient Instructions (Addendum)
Inhaler medication refilled at local pharmacy  STOP SMOKING  REFERRAL MADE TO SEE LUNG DOCTOR  Please schedule CT chest at [facility]

## 2023-09-23 NOTE — Progress Notes (Signed)
Careteam: Patient Care Team: Clarence Heir, NP as PCP - General (Adult Health Nurse Practitioner)  Seen by: Hazle Nordmann, AGNP-C  PLACE OF SERVICE:  [facility]  Advanced Directive information Does Patient Have a Medical Advance Directive?: No, Would patient like information on creating a medical advance directive?: No - Patient declined  No Known Allergies  Chief Complaint  Patient presents with   Medical Management of Chronic Issues    3 month follow up.    Immunizations    Discuss the need for DTAP vaccine, Shingrix vaccine.    Health Maintenance    Discuss the need for Eye exam.      HPI: Patient is a 77 y.o. male seen today for medical management of chronic conditions.   Guardian present during encounter.   Started smoking in his 20's. Smoking 1 PPD. No plans to quit at this time. Continues to have worsening sob with albuterol prn, Advair and Spiriva. Guardian has noticed he walks a short distance and has to stop. Guardian unsure is he is taking inhalers as prescribed. He is asking for refills on all inhalers today. 88% ambulating 10 ft. O2 sat at rest 92%. 10/2022 CT chest noted moderate to severe centrilobular and paraseptal emphysema and bilateral upper lobe scarring likely sequelae from TB infection.   T2DM- Last A1c 5.8> repeat lab today. Remains on Amaryl and farxiga. Urine microalbumin collected> foul cloudy urine noted upon collection. Denies urinary symptoms> plan to collect UA/culture.   He is having trouble driving at night. Discussed establishing with eye doctor to have eye examination.   BP at goal, remains on lisinopril, furosemide and metoprolol.   Past MMSE 12/30, denies getting lost while driving. Guardian assists with medications, uses pill box.   Review of Systems:  Review of Systems  Constitutional: Negative.   HENT: Negative.    Eyes: Negative.   Respiratory:  Positive for shortness of breath and wheezing. Negative for cough.   Cardiovascular:   Negative for chest pain and leg swelling.  Gastrointestinal: Negative.   Genitourinary:  Positive for frequency.  Musculoskeletal:  Negative for falls and joint pain.  Skin: Negative.   Neurological: Negative.   Psychiatric/Behavioral: Negative.      Past Medical History:  Diagnosis Date   Abnormal CT of the chest 2009   nodule 9 x 6 mm RUL   Benign localized hyperplasia of prostate with urinary obstruction and other lower urinary tract symptoms (LUTS)(600.21)    Cardiomegaly    Cerebrovascular accident (stroke) (HCC) 2009   lacunar infarct in the left thalamus   Cholelithiasis    Chronic airway obstruction, not elsewhere classified    Chronic kidney disease, stage III (moderate) (HCC)    Closed femur fracture (HCC) 1960   Contact with or exposure to viral disease    suspected    Illiterate    Obesity, unspecified    Other and unspecified hyperlipidemia    Special screening examination for respiratory tuberculosis    Tobacco use disorder    Tuberculosis    Type II or unspecified type diabetes mellitus with renal manifestations, uncontrolled(250.42)    Unspecified essential hypertension    Past Surgical History:  Procedure Laterality Date   FEMUR FRACTURE SURGERY Left 1960   Social History:   reports that he has been smoking cigarettes. He has a 30 pack-year smoking history. He has never used smokeless tobacco. He reports that he does not currently use alcohol. He reports that he does not use drugs.  Family History  Problem Relation Age of Onset   Heart disease Mother     Medications: Patient's Medications  New Prescriptions   No medications on file  Previous Medications   ABRYSVO 120 MCG/0.5ML INJECTION       ALBUTEROL (VENTOLIN HFA) 108 (90 BASE) MCG/ACT INHALER    INHALE 1 TO 2 PUFFS BY MOUTH EVERY 4 TO 6 HOURS AS NEEDED FOR BREATHING   BUPROPION (WELLBUTRIN XL) 150 MG 24 HR TABLET    TAKE 1 TABLET(150 MG) BY MOUTH DAILY   DAPAGLIFLOZIN PROPANEDIOL (FARXIGA) 5 MG  TABS TABLET    Take 1 tablet (5 mg total) by mouth daily before breakfast.   FERROUS SULFATE (FEROSUL) 325 (65 FE) MG TABLET    Take 1 tablet (325 mg total) by mouth 2 (two) times daily with a meal.   FLUTICASONE-SALMETEROL (ADVAIR) 250-50 MCG/ACT AEPB    Inhale 1 puff into the lungs in the morning and at bedtime.   FUROSEMIDE (LASIX) 20 MG TABLET    TAKE 1 TABLET(20 MG) BY MOUTH DAILY   GLIMEPIRIDE (AMARYL) 1 MG TABLET    Take 1 tablet (1 mg total) by mouth daily with breakfast.   LISINOPRIL (ZESTRIL) 2.5 MG TABLET    Take 1 tablet (2.5 mg total) by mouth daily.   LOVASTATIN (MEVACOR) 20 MG TABLET    Take 1 tablet (20 mg total) by mouth at bedtime.   METOPROLOL SUCCINATE (TOPROL-XL) 25 MG 24 HR TABLET    TAKE 1 TABLET(25 MG) BY MOUTH DAILY   TAMSULOSIN (FLOMAX) 0.4 MG CAPS CAPSULE    Take 1 capsule (0.4 mg total) by mouth daily. Take with food   TIOTROPIUM BROMIDE MONOHYDRATE (SPIRIVA RESPIMAT) 1.25 MCG/ACT AERS    Inhale 2 puffs into the lungs daily.  Modified Medications   No medications on file  Discontinued Medications   No medications on file    Physical Exam:  Vitals:   09/23/23 1430  BP: 132/84  Resp: 16  Temp: (!) 96.3 F (35.7 C)  Weight: 154 lb 6.4 oz (70 kg)  Height: 5\' 6"  (1.676 m)   Body mass index is 24.92 kg/m. Wt Readings from Last 3 Encounters:  09/23/23 154 lb 6.4 oz (70 kg)  06/24/23 155 lb 6.4 oz (70.5 kg)  02/17/23 155 lb 9.6 oz (70.6 kg)    Physical Exam Vitals reviewed.  Constitutional:      General: He is not in acute distress.    Comments: Strong cigarette odor  HENT:     Head: Normocephalic.  Eyes:     General:        Right eye: No discharge.        Left eye: No discharge.  Cardiovascular:     Rate and Rhythm: Normal rate and regular rhythm.     Pulses: Normal pulses.     Heart sounds: Normal heart sounds.  Pulmonary:     Effort: Pulmonary effort is normal. No respiratory distress.     Breath sounds: Wheezing and rhonchi present.   Abdominal:     General: Bowel sounds are normal.     Palpations: Abdomen is soft.  Musculoskeletal:     Cervical back: Neck supple.     Right lower leg: No edema.     Left lower leg: No edema.  Skin:    General: Skin is warm.     Capillary Refill: Capillary refill takes less than 2 seconds.  Neurological:     General: No focal deficit present.  Mental Status: He is alert. Mental status is at baseline.     Motor: No weakness.     Gait: Gait normal.  Psychiatric:        Mood and Affect: Mood normal.     Labs reviewed: Basic Metabolic Panel: Recent Labs    02/17/23 1514 06/24/23 1545  NA 144 142  K 4.4 3.9  CL 102 103  CO2 25 30  GLUCOSE 90 67  BUN 28* 19  CREATININE 1.75* 1.92*  CALCIUM 9.3 8.8   Liver Function Tests: No results for input(s): "AST", "ALT", "ALKPHOS", "BILITOT", "PROT", "ALBUMIN" in the last 8760 hours. No results for input(s): "LIPASE", "AMYLASE" in the last 8760 hours. No results for input(s): "AMMONIA" in the last 8760 hours. CBC: Recent Labs    06/24/23 1545  WBC 8.4  NEUTROABS 5,443  HGB 17.3*  HCT 54.5*  MCV 91.0  PLT 185   Lipid Panel: Recent Labs    02/17/23 1514  CHOL 144  HDL 37*  LDLCALC 81  TRIG 295*  CHOLHDL 3.9   TSH: No results for input(s): "TSH" in the last 8760 hours. A1C: Lab Results  Component Value Date   HGBA1C 5.8 (H) 06/24/2023     Assessment/Plan 1. Continuous dependence on cigarette smoking (Primary) - smoked since 20's - no plans to quit - will schedule CT Chest to r/o lung cancer - albuterol (VENTOLIN HFA) 108 (90 Base) MCG/ACT inhaler; INHALE 1 TO 2 PUFFS BY MOUTH EVERY 4 TO 6 HOURS AS NEEDED FOR BREATHING  Dispense: 17 each; Refill: 11 - CT CHEST LUNG CA SCREEN LOW DOSE W/O CM; Future  2. Dyspnea on exertion - ongoing - 88% ambulating about 10 ft, 92% at rest - 10/2022 CT Chest indicated moderate to severe emphysema - smoked cigarettes 1PPD x 50 years - cont albuterol prn, Advair and  Singulair - ? Medication adherence - discussed smoking cessation> declined - guardian requesting pulmonary referral  - albuterol (VENTOLIN HFA) 108 (90 Base) MCG/ACT inhaler; INHALE 1 TO 2 PUFFS BY MOUTH EVERY 4 TO 6 HOURS AS NEEDED FOR BREATHING  Dispense: 17 each; Refill: 11 - fluticasone-salmeterol (ADVAIR) 250-50 MCG/ACT AEPB; Inhale 1 puff into the lungs in the morning and at bedtime.  Dispense: 60 each; Refill: 5 - Tiotropium Bromide Monohydrate (SPIRIVA RESPIMAT) 1.25 MCG/ACT AERS; Inhale 2 puffs into the lungs daily.  Dispense: 1 each; Refill: 2 - Ambulatory referral to Pulmonology  3. Controlled type 2 diabetes mellitus with stage 3 chronic kidney disease, without long-term current use of insulin (HCC) - A1c 5.8 - cont Amaryl and Farixga - Basic Metabolic Panel with eGFR - Hemoglobin A1c - Microalbumin/Creatinine Ratio, Urine  4. Foul smelling urine - noted during urine microalbumin collection - asymptomatic - Urinalysis - Culture, Urine  5. Essential hypertension - controlled with lisinopril, furosemide and metoprolol  6. Memory impairment - ongoing - MMSE 12/30 in past - lives on own - guardian helps with medication management  Total time: 35 minutes. Greater than 50% of total time spent doing patient education regarding DOE, smoking cessation, HTN, T2DM and poor memory including symptom/medication management.     Next appt: 12/23/2023  Bettina Gavia  [facility] & Adult Medicine [phone]

## 2023-09-24 ENCOUNTER — Other Ambulatory Visit: Payer: Self-pay | Admitting: Orthopedic Surgery

## 2023-09-24 DIAGNOSIS — R809 Proteinuria, unspecified: Secondary | ICD-10-CM

## 2023-09-24 DIAGNOSIS — N1832 Chronic kidney disease, stage 3b: Secondary | ICD-10-CM

## 2023-09-24 DIAGNOSIS — I428 Other cardiomyopathies: Secondary | ICD-10-CM

## 2023-09-24 LAB — URINE CULTURE
MICRO NUMBER:: 15875727
SPECIMEN QUALITY:: ADEQUATE

## 2023-09-24 LAB — MICROALBUMIN / CREATININE URINE RATIO
Creatinine, Urine: 107 mg/dL (ref 20–320)
Microalb Creat Ratio: 350 mg/g{creat} — ABNORMAL HIGH (ref <30)
Microalb, Ur: 37.5 mg/dL

## 2023-09-24 LAB — URINALYSIS
Bilirubin Urine: NEGATIVE
Glucose, UA: NEGATIVE
Ketones, ur: NEGATIVE
Nitrite: NEGATIVE
Specific Gravity, Urine: 1.017 (ref 1.001–1.035)
pH: 5.5 (ref 5.0–8.0)

## 2023-09-24 LAB — BASIC METABOLIC PANEL WITH GFR
BUN/Creatinine Ratio: 19 (calc) (ref 6–22)
BUN: 36 mg/dL — ABNORMAL HIGH (ref 7–25)
CO2: 32 mmol/L (ref 20–32)
Calcium: 9.2 mg/dL (ref 8.6–10.3)
Chloride: 100 mmol/L (ref 98–110)
Creat: 1.93 mg/dL — ABNORMAL HIGH (ref 0.70–1.28)
Glucose, Bld: 59 mg/dL — ABNORMAL LOW (ref 65–99)
Potassium: 4.4 mmol/L (ref 3.5–5.3)
Sodium: 141 mmol/L (ref 135–146)
eGFR: 35 mL/min/{1.73_m2} — ABNORMAL LOW (ref >=60)

## 2023-09-24 LAB — HEMOGLOBIN A1C
Hgb A1c MFr Bld: 5.7 %{Hb} — ABNORMAL HIGH (ref <5.7)
Mean Plasma Glucose: 117 mg/dL
eAG (mmol/L): 6.5 mmol/L

## 2023-09-24 MED ORDER — LISINOPRIL 5 MG PO TABS: 5.0000 mg | ORAL_TABLET | Freq: Every day | ORAL | 1 refills | Status: DC

## 2023-09-24 MED ORDER — FUROSEMIDE 20 MG PO TABS: 20.0000 mg | ORAL_TABLET | ORAL | 3 refills | Status: AC

## 2023-10-01 ENCOUNTER — Other Ambulatory Visit: Payer: Self-pay

## 2023-10-01 DIAGNOSIS — N1832 Chronic kidney disease, stage 3b: Secondary | ICD-10-CM

## 2023-10-01 DIAGNOSIS — I428 Other cardiomyopathies: Secondary | ICD-10-CM

## 2023-10-14 ENCOUNTER — Ambulatory Visit (INDEPENDENT_AMBULATORY_CARE_PROVIDER_SITE_OTHER): Payer: Medicare HMO | Admitting: Orthopedic Surgery

## 2023-10-14 ENCOUNTER — Encounter: Payer: Self-pay | Admitting: Orthopedic Surgery

## 2023-10-14 VITALS — BP 140/80 | HR 120 | Temp 97.2°F | Resp 16 | Ht 66.0 in | Wt 157.0 lb

## 2023-10-14 DIAGNOSIS — L03115 Cellulitis of right lower limb: Secondary | ICD-10-CM

## 2023-10-14 DIAGNOSIS — Z139 Encounter for screening, unspecified: Secondary | ICD-10-CM | POA: Diagnosis not present

## 2023-10-14 DIAGNOSIS — F17200 Nicotine dependence, unspecified, uncomplicated: Secondary | ICD-10-CM

## 2023-10-14 DIAGNOSIS — L97519 Non-pressure chronic ulcer of other part of right foot with unspecified severity: Secondary | ICD-10-CM | POA: Diagnosis not present

## 2023-10-14 DIAGNOSIS — E11621 Type 2 diabetes mellitus with foot ulcer: Secondary | ICD-10-CM | POA: Diagnosis not present

## 2023-10-14 MED ORDER — DOXYCYCLINE HYCLATE 100 MG PO TABS: 100.0000 mg | ORAL_TABLET | Freq: Two times a day (BID) | ORAL | 0 refills | Status: DC

## 2023-10-14 NOTE — Progress Notes (Signed)
 Careteam: Patient Care Team: Clarence Clarence BRAVO, NP as PCP - General (Adult Health Nurse Practitioner)  Seen by: Clarence Maldonado, AGNP-C  PLACE OF SERVICE:  [facility] CLIN[facility]  Advanced Directive information Does Patient Have a Medical Advance Directive?: No, Would patient like information on creating a medical advance directive?: No - Patient declined  No Known Allergies  Chief Complaint  Patient presents with   Acute Visit    Patient complains of right foot abscess.      HPI: Patient is a 78 y.o. male seen today for acute visit due to right foot infection.   Guardian, Clarence Maldonado present today.   He reports scratch to right great toe about 3 weeks ago. Scratch has slowly become open wound with scab. He has been trying to treat on his own with neosporin. He denies pain, but reports intermittent numbness.  About 1 week ago, right lateral ankle began to have skin breakdown. His guardian wanted him to go to the ED, but he refused. He continues to refused ED evaluation today. H/o T2DM, last A1c 5.7 09/23/2023. He has also been a heavy smoker for most his life and has signs of PAD. Afebrile. Vitals stable.   He continues to have trouble making doctor appointments, even with the help from his guardian. Unfortunately, guardian is now experiencing his own health issues and worried he is unable to help patient with increased speciality visits. Both patient and guardian agree he needs a state appointed guardian moving forward. I tried calling [facility] multiple times today, no answer. Guardian also gave me telephone number of a lady named Cara who helped with his care in past. He described her as a engineer, civil (consulting). I was able to reach [facility] and discuss situation. She reports being his nurse years ago, when he underwent TB treatment. She went to his residence daily to make sure he took necessary treatment.   He reports smoking daily. He states  it is only a few. He is having  intermittent shortness of breath at times. I made referral to pulmonary but her has yet to schedule. Discussed importance of smoking cessation. No plans to quit at this time.    Review of Systems:  Review of Systems  Constitutional:  Negative for fever and malaise/fatigue.  Respiratory:  Positive for shortness of breath and wheezing. Negative for cough.   Cardiovascular:  Negative for chest pain.  Musculoskeletal:  Negative for falls and joint pain.  Skin:        Wound  Neurological:  Positive for weakness. Negative for dizziness and headaches.  Psychiatric/Behavioral:  Negative for depression. The patient is not nervous/anxious.     Past Medical History:  Diagnosis Date   Abnormal CT of the chest 2009   nodule 9 x 6 mm RUL   Benign localized hyperplasia of prostate with urinary obstruction and other lower urinary tract symptoms (LUTS)(600.21)    Cardiomegaly    Cerebrovascular accident (stroke) (HCC) 2009   lacunar infarct in the left thalamus   Cholelithiasis    Chronic airway obstruction, not elsewhere classified    Chronic kidney disease, stage III (moderate) (HCC)    Closed femur fracture (HCC) 1960   Contact with or exposure to viral disease    suspected    Illiterate    Obesity, unspecified    Other and unspecified hyperlipidemia    Special screening examination for respiratory tuberculosis    Tobacco use disorder    Tuberculosis    Type  II or unspecified type diabetes mellitus with renal manifestations, uncontrolled(250.42)    Unspecified essential hypertension    Past Surgical History:  Procedure Laterality Date   FEMUR FRACTURE SURGERY Left 1960   Social History:   reports that he has been smoking cigarettes. He has a 30 pack-year smoking history. He has never used smokeless tobacco. He reports that he does not currently use alcohol. He reports that he does not use drugs.  Family History  Problem Relation Age of Onset   Heart disease Mother      Medications: Patient's Medications  New Prescriptions   DOXYCYCLINE  (VIBRA -TABS) 100 MG TABLET    Take 1 tablet (100 mg total) by mouth 2 (two) times daily for 21 days.  Previous Medications   ABRYSVO 120 MCG/0.5ML INJECTION       ALBUTEROL  (VENTOLIN  HFA) 108 (90 BASE) MCG/ACT INHALER    INHALE 1 TO 2 PUFFS BY MOUTH EVERY 4 TO 6 HOURS AS NEEDED FOR BREATHING   BUPROPION  (WELLBUTRIN  XL) 150 MG 24 HR TABLET    TAKE 1 TABLET(150 MG) BY MOUTH DAILY   DAPAGLIFLOZIN  PROPANEDIOL (FARXIGA ) 5 MG TABS TABLET    Take 1 tablet (5 mg total) by mouth daily before breakfast.   FERROUS SULFATE  (FEROSUL) 325 (65 FE) MG TABLET    Take 1 tablet (325 mg total) by mouth 2 (two) times daily with a meal.   FLUTICASONE -SALMETEROL (ADVAIR) 250-50 MCG/ACT AEPB    Inhale 1 puff into the lungs in the morning and at bedtime.   FUROSEMIDE  (LASIX ) 20 MG TABLET    Take 1 tablet (20 mg total) by mouth 3 (three) times a week.   GLIMEPIRIDE  (AMARYL ) 1 MG TABLET    Take 1 tablet (1 mg total) by mouth daily with breakfast.   LISINOPRIL  (ZESTRIL ) 5 MG TABLET    Take 1 tablet (5 mg total) by mouth daily.   LOVASTATIN  (MEVACOR ) 20 MG TABLET    Take 1 tablet (20 mg total) by mouth at bedtime.   METOPROLOL  SUCCINATE (TOPROL -XL) 25 MG 24 HR TABLET    TAKE 1 TABLET(25 MG) BY MOUTH DAILY   TAMSULOSIN  (FLOMAX ) 0.4 MG CAPS CAPSULE    Take 1 capsule (0.4 mg total) by mouth daily. Take with food   TIOTROPIUM BROMIDE MONOHYDRATE  (SPIRIVA  RESPIMAT) 1.25 MCG/ACT AERS    Inhale 2 puffs into the lungs daily.  Modified Medications   No medications on file  Discontinued Medications   No medications on file    Physical Exam:  Vitals:   10/14/23 1305  BP: (!) 140/80  Pulse: (!) 120  Resp: 16  Temp: (!) 97.2 F (36.2 C)  SpO2: 90%  Weight: 157 lb (71.2 kg)  Height: 5' 6 (1.676 m)   Body mass index is 25.34 kg/m. Wt Readings from Last 3 Encounters:  10/14/23 157 lb (71.2 kg)  09/23/23 154 lb 6.4 oz (70 kg)  06/24/23 155 lb 6.4  oz (70.5 kg)    Physical Exam Vitals reviewed.  Constitutional:      General: He is not in acute distress. HENT:     Head: Normocephalic.  Eyes:     General:        Right eye: No discharge.        Left eye: No discharge.  Cardiovascular:     Rate and Rhythm: Normal rate and regular rhythm.     Pulses: Normal pulses.     Heart sounds: Normal heart sounds.  Pulmonary:     Effort:  Pulmonary effort is normal. No respiratory distress.     Breath sounds: Wheezing present. No rales.  Abdominal:     Palpations: Abdomen is soft.  Musculoskeletal:     Cervical back: Neck supple.     Right lower leg: No edema.     Left lower leg: No edema.  Skin:    General: Skin is warm.     Capillary Refill: Capillary refill takes less than 2 seconds.     Findings: Erythema present.     Comments: Approx 1-2 cm round wound to right great toe, increased erythema and mild swelling present, unable to determine depth of wound due to hard scab/ possible eschar, foot feels cool to touch, unable to determine dorsal pedal pulse. Right lateral ankle with large area of skin breakdown, skin excoriated with some areas of eschar, some purulent drainage present, no tenderness or swelling, mild odor noted during exam.   Neurological:     General: No focal deficit present.     Mental Status: He is alert.  Psychiatric:        Mood and Affect: Mood normal.     Labs reviewed: Basic Metabolic Panel: Recent Labs    02/17/23 1514 06/24/23 1545 09/23/23 1528  NA 144 142 141  K 4.4 3.9 4.4  CL 102 103 100  CO2 25 30 32  GLUCOSE 90 67 59*  BUN 28* 19 36*  CREATININE 1.75* 1.92* 1.93*  CALCIUM  9.3 8.8 9.2   Liver Function Tests: No results for input(s): AST, ALT, ALKPHOS, BILITOT, PROT, ALBUMIN in the last 8760 hours. No results for input(s): LIPASE, AMYLASE in the last 8760 hours. No results for input(s): AMMONIA in the last 8760 hours. CBC: Recent Labs    06/24/23 1545  WBC 8.4   NEUTROABS 5,443  HGB 17.3*  HCT 54.5*  MCV 91.0  PLT 185   Lipid Panel: Recent Labs    02/17/23 1514  CHOL 144  HDL 37*  LDLCALC 81  TRIG 837*  CHOLHDL 3.9   TSH: No results for input(s): TSH in the last 8760 hours. A1C: Lab Results  Component Value Date   HGBA1C 5.7 (H) 09/23/2023     Assessment/Plan 1. Cellulitis of right leg (Primary) - noted to right lateral ankle  - unknown onset, but began after injury to right great toe - increased risk for infection due to T2DM, poor hygiene and suspected PAD from smoking  - refused ED evaluation  - advised to report to ED if wounds appear worse or symptoms of fever, pain, swelling, increase redness occur - AMB referral to wound care center - doxycycline  (VIBRA -TABS) 100 MG tablet; Take 1 tablet (100 mg total) by mouth 2 (two) times daily for 21 days.  Dispense: 42 tablet; Refill: 0  2. Diabetic ulcer of other part of right foot associated with type 2 diabetes mellitus, unspecified ulcer stage (HCC) - onset about 3 weeks ago - started as scratch - unable to determine depth  - will obtain xray to see if any osteomyelitis  - start doxycycline  x 21 days - AMB referral to wound care center - DG Foot Complete Right; Future  3. Encounter for screening involving social determinants of health (SDoH) - guardian unable to help patient if he needs more care> see above - guardian is also of advanced age with increased health issues - both agree to start process of state appointed guardian - unsuccessful attempt to discuss with [facility] - will make   4.  Tobacco use disorder - 1-2 PPD > 30 years  - no plans to quit - CT chest lung cancer screening ordered> not scheduled    Total time: 48 minutes. Greater than 50% of total time spent doing patient education regarding cellulitis, diabetic ulcer, SDOH needs, and smoking cessation including symptom/medication management.    Next appt: 12/23/2023  Clarence Maldonado BODILY  [facility] & Adult Medicine [phone]

## 2023-10-14 NOTE — Patient Instructions (Addendum)
 We will start doxycycline antibiotic for right foot infection  SCHEDULE WITH WOUND CARE ASAP  PLEASE GO TO [facility] TO HAVE RIGHT FOOT XRAY  REPORT TO EMERGENCY DEPARTMENT IF FOOT GETS WORSE IMMEDIATELY  STOP SMOKING

## 2023-10-15 ENCOUNTER — Telehealth: Payer: Self-pay | Admitting: *Deleted

## 2023-10-15 NOTE — Progress Notes (Signed)
 Complex Care Management Note  Care Guide Note 10/15/2023 Name: RORAN WEGNER MRN: [id number] DOB: [birth year]  CESARE SUMLIN is a 78 y.o. year old male who sees [place], Amy E, NP for primary care. I reached out to Lamar CHRISTELLA Ditch friend care giver [name] DPR on file by phone today to offer complex care management services.  Mr. Demarest friend care giver [name] DPR on file was given information about Complex Care Management services today including:   The Complex Care Management services include support from the care team which includes your Nurse Coordinator, Clinical Social Worker, or Pharmacist.  The Complex Care Management team is here to help remove barriers to the health concerns and goals most important to you. Complex Care Management services are voluntary, and the patient may decline or stop services at any time by request to their care team member.   Complex Care Management Consent Status: Patient friend care giver [name] DPR on file agreed to services and verbal consent obtained.   Follow up plan:  Telephone appointment with complex care management team member scheduled for:  10/20/23  Encounter Outcome:  Patient Scheduled  [facility] Coordination Care Guide  Direct Dial: [phone]

## 2023-10-18 ENCOUNTER — Other Ambulatory Visit: Payer: Medicare HMO

## 2023-10-19 ENCOUNTER — Ambulatory Visit
Admission: RE | Admit: 2023-10-19 | Discharge: 2023-10-19 | Disposition: A | Discharge status: Home / Self Care | Payer: Medicare HMO | Source: Ambulatory Visit | Attending: Orthopedic Surgery | Admitting: Orthopedic Surgery

## 2023-10-19 DIAGNOSIS — L97519 Non-pressure chronic ulcer of other part of right foot with unspecified severity: Secondary | ICD-10-CM | POA: Diagnosis not present

## 2023-10-19 DIAGNOSIS — E11621 Type 2 diabetes mellitus with foot ulcer: Secondary | ICD-10-CM

## 2023-10-20 ENCOUNTER — Ambulatory Visit: Payer: Self-pay | Admitting: Licensed Clinical Social Worker

## 2023-10-22 ENCOUNTER — Inpatient Hospital Stay (HOSPITAL_COMMUNITY)
Admission: EM | Admit: 2023-10-22 | Discharge: 2023-11-03 | DRG: 240 | Disposition: A | Discharge status: Skilled Nursing Facility | Payer: Medicare HMO | Attending: Family Medicine | Admitting: Family Medicine

## 2023-10-22 ENCOUNTER — Emergency Department (HOSPITAL_COMMUNITY): Payer: Medicare HMO

## 2023-10-22 ENCOUNTER — Other Ambulatory Visit: Payer: Self-pay

## 2023-10-22 ENCOUNTER — Encounter (HOSPITAL_BASED_OUTPATIENT_CLINIC_OR_DEPARTMENT_OTHER): Payer: Medicare HMO | Admitting: Internal Medicine

## 2023-10-22 ENCOUNTER — Encounter (HOSPITAL_COMMUNITY): Payer: Self-pay | Admitting: Emergency Medicine

## 2023-10-22 DIAGNOSIS — E669 Obesity, unspecified: Secondary | ICD-10-CM | POA: Diagnosis present

## 2023-10-22 DIAGNOSIS — J449 Chronic obstructive pulmonary disease, unspecified: Secondary | ICD-10-CM | POA: Diagnosis present

## 2023-10-22 DIAGNOSIS — Z8249 Family history of ischemic heart disease and other diseases of the circulatory system: Secondary | ICD-10-CM

## 2023-10-22 DIAGNOSIS — F17210 Nicotine dependence, cigarettes, uncomplicated: Secondary | ICD-10-CM | POA: Diagnosis present

## 2023-10-22 DIAGNOSIS — E1152 Type 2 diabetes mellitus with diabetic peripheral angiopathy with gangrene: Principal | ICD-10-CM | POA: Diagnosis present

## 2023-10-22 DIAGNOSIS — N400 Enlarged prostate without lower urinary tract symptoms: Secondary | ICD-10-CM | POA: Diagnosis not present

## 2023-10-22 DIAGNOSIS — M7989 Other specified soft tissue disorders: Secondary | ICD-10-CM | POA: Diagnosis not present

## 2023-10-22 DIAGNOSIS — H9190 Unspecified hearing loss, unspecified ear: Secondary | ICD-10-CM | POA: Diagnosis present

## 2023-10-22 DIAGNOSIS — I2510 Atherosclerotic heart disease of native coronary artery without angina pectoris: Secondary | ICD-10-CM | POA: Diagnosis not present

## 2023-10-22 DIAGNOSIS — L02415 Cutaneous abscess of right lower limb: Secondary | ICD-10-CM | POA: Diagnosis not present

## 2023-10-22 DIAGNOSIS — E1165 Type 2 diabetes mellitus with hyperglycemia: Secondary | ICD-10-CM | POA: Diagnosis present

## 2023-10-22 DIAGNOSIS — Z7951 Long term (current) use of inhaled steroids: Secondary | ICD-10-CM

## 2023-10-22 DIAGNOSIS — F32A Depression, unspecified: Secondary | ICD-10-CM | POA: Diagnosis present

## 2023-10-22 DIAGNOSIS — N1832 Chronic kidney disease, stage 3b: Secondary | ICD-10-CM | POA: Diagnosis present

## 2023-10-22 DIAGNOSIS — Z89611 Acquired absence of right leg above knee: Secondary | ICD-10-CM | POA: Diagnosis not present

## 2023-10-22 DIAGNOSIS — E11621 Type 2 diabetes mellitus with foot ulcer: Secondary | ICD-10-CM | POA: Diagnosis present

## 2023-10-22 DIAGNOSIS — E1122 Type 2 diabetes mellitus with diabetic chronic kidney disease: Secondary | ICD-10-CM | POA: Diagnosis present

## 2023-10-22 DIAGNOSIS — E440 Moderate protein-calorie malnutrition: Secondary | ICD-10-CM | POA: Diagnosis present

## 2023-10-22 DIAGNOSIS — I70261 Atherosclerosis of native arteries of extremities with gangrene, right leg: Secondary | ICD-10-CM | POA: Diagnosis present

## 2023-10-22 DIAGNOSIS — E785 Hyperlipidemia, unspecified: Secondary | ICD-10-CM | POA: Diagnosis present

## 2023-10-22 DIAGNOSIS — Z7984 Long term (current) use of oral hypoglycemic drugs: Secondary | ICD-10-CM

## 2023-10-22 DIAGNOSIS — L97519 Non-pressure chronic ulcer of other part of right foot with unspecified severity: Secondary | ICD-10-CM | POA: Diagnosis present

## 2023-10-22 DIAGNOSIS — L859 Epidermal thickening, unspecified: Secondary | ICD-10-CM | POA: Diagnosis not present

## 2023-10-22 DIAGNOSIS — L03115 Cellulitis of right lower limb: Secondary | ICD-10-CM | POA: Diagnosis present

## 2023-10-22 DIAGNOSIS — L97909 Non-pressure chronic ulcer of unspecified part of unspecified lower leg with unspecified severity: Secondary | ICD-10-CM | POA: Diagnosis not present

## 2023-10-22 DIAGNOSIS — I509 Heart failure, unspecified: Secondary | ICD-10-CM | POA: Diagnosis not present

## 2023-10-22 DIAGNOSIS — I70221 Atherosclerosis of native arteries of extremities with rest pain, right leg: Secondary | ICD-10-CM | POA: Diagnosis not present

## 2023-10-22 DIAGNOSIS — Z8673 Personal history of transient ischemic attack (TIA), and cerebral infarction without residual deficits: Secondary | ICD-10-CM | POA: Diagnosis not present

## 2023-10-22 DIAGNOSIS — I10 Essential (primary) hypertension: Secondary | ICD-10-CM | POA: Diagnosis present

## 2023-10-22 DIAGNOSIS — I70235 Atherosclerosis of native arteries of right leg with ulceration of other part of foot: Secondary | ICD-10-CM | POA: Diagnosis not present

## 2023-10-22 DIAGNOSIS — F339 Major depressive disorder, recurrent, unspecified: Secondary | ICD-10-CM | POA: Diagnosis not present

## 2023-10-22 DIAGNOSIS — I130 Hypertensive heart and chronic kidney disease with heart failure and stage 1 through stage 4 chronic kidney disease, or unspecified chronic kidney disease: Secondary | ICD-10-CM | POA: Diagnosis not present

## 2023-10-22 DIAGNOSIS — R600 Localized edema: Secondary | ICD-10-CM | POA: Diagnosis not present

## 2023-10-22 DIAGNOSIS — I252 Old myocardial infarction: Secondary | ICD-10-CM

## 2023-10-22 DIAGNOSIS — I5020 Unspecified systolic (congestive) heart failure: Secondary | ICD-10-CM | POA: Diagnosis not present

## 2023-10-22 DIAGNOSIS — Z4781 Encounter for orthopedic aftercare following surgical amputation: Secondary | ICD-10-CM | POA: Diagnosis not present

## 2023-10-22 DIAGNOSIS — M2041 Other hammer toe(s) (acquired), right foot: Secondary | ICD-10-CM | POA: Diagnosis not present

## 2023-10-22 DIAGNOSIS — Z79899 Other long term (current) drug therapy: Secondary | ICD-10-CM | POA: Diagnosis not present

## 2023-10-22 DIAGNOSIS — Z87891 Personal history of nicotine dependence: Secondary | ICD-10-CM | POA: Diagnosis not present

## 2023-10-22 DIAGNOSIS — N401 Enlarged prostate with lower urinary tract symptoms: Secondary | ICD-10-CM | POA: Diagnosis present

## 2023-10-22 DIAGNOSIS — F419 Anxiety disorder, unspecified: Secondary | ICD-10-CM | POA: Diagnosis present

## 2023-10-22 DIAGNOSIS — E119 Type 2 diabetes mellitus without complications: Secondary | ICD-10-CM | POA: Diagnosis not present

## 2023-10-22 DIAGNOSIS — I739 Peripheral vascular disease, unspecified: Secondary | ICD-10-CM | POA: Diagnosis not present

## 2023-10-22 DIAGNOSIS — M19071 Primary osteoarthritis, right ankle and foot: Secondary | ICD-10-CM | POA: Diagnosis not present

## 2023-10-22 DIAGNOSIS — L0390 Cellulitis, unspecified: Principal | ICD-10-CM | POA: Diagnosis present

## 2023-10-22 DIAGNOSIS — N183 Chronic kidney disease, stage 3 unspecified: Secondary | ICD-10-CM | POA: Diagnosis not present

## 2023-10-22 DIAGNOSIS — I70201 Unspecified atherosclerosis of native arteries of extremities, right leg: Secondary | ICD-10-CM | POA: Diagnosis not present

## 2023-10-22 DIAGNOSIS — L97919 Non-pressure chronic ulcer of unspecified part of right lower leg with unspecified severity: Secondary | ICD-10-CM | POA: Diagnosis not present

## 2023-10-22 DIAGNOSIS — R58 Hemorrhage, not elsewhere classified: Secondary | ICD-10-CM | POA: Diagnosis not present

## 2023-10-22 DIAGNOSIS — R41841 Cognitive communication deficit: Secondary | ICD-10-CM | POA: Diagnosis not present

## 2023-10-22 DIAGNOSIS — M6281 Muscle weakness (generalized): Secondary | ICD-10-CM | POA: Diagnosis not present

## 2023-10-22 DIAGNOSIS — I639 Cerebral infarction, unspecified: Secondary | ICD-10-CM | POA: Diagnosis not present

## 2023-10-22 DIAGNOSIS — Z6825 Body mass index (BMI) 25.0-25.9, adult: Secondary | ICD-10-CM

## 2023-10-22 LAB — COMPREHENSIVE METABOLIC PANEL
ALT: 11 U/L (ref 0–44)
AST: 17 U/L (ref 15–41)
Albumin: 3.6 g/dL (ref 3.5–5.0)
Alkaline Phosphatase: 69 U/L (ref 38–126)
Anion gap: 9 (ref 5–15)
BUN: 42 mg/dL — ABNORMAL HIGH (ref 8–23)
CO2: 30 mmol/L (ref 22–32)
Calcium: 9.3 mg/dL (ref 8.9–10.3)
Chloride: 100 mmol/L (ref 98–111)
Creatinine, Ser: 1.94 mg/dL — ABNORMAL HIGH (ref 0.61–1.24)
GFR, Estimated: 35 mL/min — ABNORMAL LOW (ref >60)
Glucose, Bld: 146 mg/dL — ABNORMAL HIGH (ref 70–99)
Potassium: 4.1 mmol/L (ref 3.5–5.1)
Sodium: 139 mmol/L (ref 135–145)
Total Bilirubin: 0.5 mg/dL (ref 0.0–1.2)
Total Protein: 7.4 g/dL (ref 6.5–8.1)

## 2023-10-22 LAB — CBC
HCT: 51.6 % (ref 39.0–52.0)
Hemoglobin: 15.9 g/dL (ref 13.0–17.0)
MCH: 28.7 pg (ref 26.0–34.0)
MCHC: 30.8 g/dL (ref 30.0–36.0)
MCV: 93.1 fL (ref 80.0–100.0)
Platelets: 191 10*3/uL (ref 150–400)
RBC: 5.54 MIL/uL (ref 4.22–5.81)
RDW: 14.7 % (ref 11.5–15.5)
WBC: 8.2 10*3/uL (ref 4.0–10.5)
nRBC: 0 % (ref 0.0–0.2)

## 2023-10-22 LAB — LACTIC ACID, PLASMA
Lactic Acid, Venous: 1.5 mmol/L (ref 0.5–1.9)
Lactic Acid, Venous: 1.2 mmol/L (ref 0.5–1.9)

## 2023-10-22 MED ORDER — BUPROPION HCL ER (XL) 150 MG PO TB24
150.0000 mg | ORAL_TABLET | Freq: Every day | ORAL | Status: DC
  Administered 2023-10-23 – 2023-11-03 (×11): 150 mg via ORAL
  Filled 2023-10-22 (×11): qty 1

## 2023-10-22 MED ORDER — ACETAMINOPHEN 325 MG PO TABS
650.0000 mg | ORAL_TABLET | Freq: Four times a day (QID) | ORAL | Status: DC | PRN
Start: 2023-10-22 — End: 2023-10-25

## 2023-10-22 MED ORDER — CEFAZOLIN SODIUM-DEXTROSE 1-4 GM/50ML-% IV SOLN
1.0000 g | Freq: Three times a day (TID) | INTRAVENOUS | Status: DC
  Administered 2023-10-23 (×2): 1 g via INTRAVENOUS
  Filled 2023-10-22 (×3): qty 50

## 2023-10-22 MED ORDER — ENOXAPARIN SODIUM 30 MG/0.3ML IJ SOSY
30.0000 mg | PREFILLED_SYRINGE | INTRAMUSCULAR | Status: DC
  Administered 2023-10-22 – 2023-10-23 (×2): 30 mg via SUBCUTANEOUS
  Filled 2023-10-22 (×2): qty 0.3

## 2023-10-22 MED ORDER — CEFAZOLIN SODIUM-DEXTROSE 2-4 GM/100ML-% IV SOLN
2.0000 g | Freq: Once | INTRAVENOUS | Status: AC
  Administered 2023-10-22: 2 g via INTRAVENOUS
  Filled 2023-10-22: qty 100

## 2023-10-22 MED ORDER — ACETAMINOPHEN 650 MG RE SUPP: 650.0000 mg | Freq: Four times a day (QID) | RECTAL | Status: DC | PRN

## 2023-10-22 MED ORDER — NICOTINE 21 MG/24HR TD PT24
21.0000 mg | MEDICATED_PATCH | Freq: Every day | TRANSDERMAL | Status: DC
  Administered 2023-10-22 – 2023-11-03 (×12): 21 mg via TRANSDERMAL
  Filled 2023-10-22 (×12): qty 1

## 2023-10-22 MED ORDER — LISINOPRIL 10 MG PO TABS
5.0000 mg | ORAL_TABLET | Freq: Every day | ORAL | Status: DC
  Administered 2023-10-23: 5 mg via ORAL
  Filled 2023-10-22: qty 1

## 2023-10-22 MED ORDER — DAPAGLIFLOZIN PROPANEDIOL 5 MG PO TABS
5.0000 mg | ORAL_TABLET | Freq: Every day | ORAL | Status: DC
  Administered 2023-10-23: 5 mg via ORAL
  Filled 2023-10-22: qty 1

## 2023-10-22 MED ORDER — PRAVASTATIN SODIUM 40 MG PO TABS
20.0000 mg | ORAL_TABLET | Freq: Every day | ORAL | Status: DC
  Administered 2023-10-23 – 2023-10-25 (×3): 20 mg via ORAL
  Filled 2023-10-22 (×3): qty 1

## 2023-10-22 MED ORDER — METOPROLOL SUCCINATE ER 25 MG PO TB24
25.0000 mg | ORAL_TABLET | Freq: Every day | ORAL | Status: DC
  Administered 2023-10-23 – 2023-11-03 (×11): 25 mg via ORAL
  Filled 2023-10-22 (×12): qty 1

## 2023-10-22 MED ORDER — TAMSULOSIN HCL 0.4 MG PO CAPS
0.4000 mg | ORAL_CAPSULE | Freq: Every day | ORAL | Status: DC
  Administered 2023-10-23 – 2023-11-03 (×11): 0.4 mg via ORAL
  Filled 2023-10-22 (×11): qty 1

## 2023-10-22 NOTE — Hospital Course (Signed)
Cellulitis -failed outpatient doxy -RLE

## 2023-10-22 NOTE — H&P (Signed)
History and Physical    Patient: Clarence Maldonado VQQ:[id number] DOB: [birth year] DOA: 10/22/2023 DOS: the patient was seen and examined on 10/22/2023 PCP: Octavia Heir, NP  Patient coming from: Home  Chief Complaint:  Chief Complaint  Patient presents with   Wound Infection   HPI: Clarence Maldonado is a 78 y.o. male with medical history significant of CKD 3B, tobacco use disorder, controlled type 2 diabetes, hyperlipidemia, hypertension, depression, BPH presenting with right lower extremity cellulitis.  Patient states that he had a scratch to the right great toe a few weeks ago which slowly became an open wound with scab.  A couple of weeks later he noticed that his right lateral ankle began to have skin breakdown.  He saw his primary care doctor on January 9 and was prescribed doxycycline.  However despite use of doxycycline, he has noticed that his skin redness has not abated.  This is what prompted him to come in for further evaluation.  He denies any fevers, chills, nausea, vomiting, chest pain, shortness of breath, diarrhea, changes in urination.  ED course: Vital signs stable.  CBC without leukocytosis and otherwise unremarkable.  CMP with mild hyperglycemia and stable kidney function.  Lactate normal.  Right ankle and foot x-rays without any acute or destructive bony abnormalities.  Started on Ancef per ED provider.  [facility] hospitalist asked to evaluate patient for admission.  Review of Systems: As mentioned in the history of present illness. All other systems reviewed and are negative. Past Medical History:  Diagnosis Date   Abnormal CT of the chest 2009   nodule 9 x 6 mm RUL   Benign localized hyperplasia of prostate with urinary obstruction and other lower urinary tract symptoms (LUTS)(600.21)    Cardiomegaly    Cerebrovascular accident (stroke) (HCC) 2009   lacunar infarct in the left thalamus   Cholelithiasis    Chronic airway obstruction, not elsewhere classified    Chronic  kidney disease, stage III (moderate) (HCC)    Closed femur fracture (HCC) 1960   Contact with or exposure to viral disease    suspected    Illiterate    Obesity, unspecified    Other and unspecified hyperlipidemia    Special screening examination for respiratory tuberculosis    Tobacco use disorder    Tuberculosis    Type II or unspecified type diabetes mellitus with renal manifestations, uncontrolled(250.42)    Unspecified essential hypertension    Past Surgical History:  Procedure Laterality Date   FEMUR FRACTURE SURGERY Left 1960   Social History:  reports that he has been smoking cigarettes. He has a 30 pack-year smoking history. He has never used smokeless tobacco. He reports that he does not currently use alcohol. He reports that he does not use drugs.  No Known Allergies  Family History  Problem Relation Age of Onset   Heart disease Mother     Prior to Admission medications   Medication Sig Start Date End Date Taking? Authorizing Provider  ABRYSVO 120 MCG/0.5ML injection  10/20/22   [provider]  albuterol (VENTOLIN HFA) 108 (90 Base) MCG/ACT inhaler INHALE 1 TO 2 PUFFS BY MOUTH EVERY 4 TO 6 HOURS AS NEEDED FOR BREATHING 09/23/23   Fargo, Amy E, NP  buPROPion (WELLBUTRIN XL) 150 MG 24 hr tablet TAKE 1 TABLET(150 MG) BY MOUTH DAILY 05/24/23   Sharon Seller, NP  dapagliflozin propanediol (FARXIGA) 5 MG TABS tablet Take 1 tablet (5 mg total) by mouth daily before breakfast. 03/17/23  Frederica Kuster, MD  doxycycline (VIBRA-TABS) 100 MG tablet Take 1 tablet (100 mg total) by mouth 2 (two) times daily for 21 days. 10/14/23 11/04/23  Octavia Heir, NP  ferrous sulfate (FEROSUL) 325 (65 FE) MG tablet Take 1 tablet (325 mg total) by mouth 2 (two) times daily with a meal. 04/06/23   Frederica Kuster, MD  fluticasone-salmeterol (ADVAIR) 250-50 MCG/ACT AEPB Inhale 1 puff into the lungs in the morning and at bedtime. 09/23/23   Fargo, Amy E, NP  furosemide (LASIX) 20 MG  tablet Take 1 tablet (20 mg total) by mouth 3 (three) times a week. 09/24/23   Fargo, Amy E, NP  glimepiride (AMARYL) 1 MG tablet Take 1 tablet (1 mg total) by mouth daily with breakfast. 05/25/23   Fargo, Amy E, NP  lisinopril (ZESTRIL) 5 MG tablet Take 1 tablet (5 mg total) by mouth daily. 09/24/23 03/22/24  Fargo, Amy E, NP  lovastatin (MEVACOR) 20 MG tablet Take 1 tablet (20 mg total) by mouth at bedtime. 01/22/23   Frederica Kuster, MD  metoprolol succinate (TOPROL-XL) 25 MG 24 hr tablet TAKE 1 TABLET(25 MG) BY MOUTH DAILY 03/18/23   Frederica Kuster, MD  tamsulosin (FLOMAX) 0.4 MG CAPS capsule Take 1 capsule (0.4 mg total) by mouth daily. Take with food 07/12/23   Fargo, Amy E, NP  Tiotropium Bromide Monohydrate (SPIRIVA RESPIMAT) 1.25 MCG/ACT AERS Inhale 2 puffs into the lungs daily. 09/23/23   Octavia Heir, NP    Physical Exam: Vitals:   10/22/23 1432 10/22/23 1435 10/22/23 1750  BP: (!) 141/69  (!) 137/90  Pulse: 84  81  Resp: 18  18  Temp: 97.9 F (36.6 C)  98 F (36.7 C)  TempSrc: Oral  Oral  SpO2: 93%  95%  Weight:  74.8 kg   Height:  5\' 6"  (1.676 m)    Physical Exam Constitutional:      Appearance: Normal appearance. He is normal weight. He is not ill-appearing.  HENT:     Head: Normocephalic and atraumatic.     Mouth/Throat:     Mouth: Mucous membranes are moist.     Pharynx: Oropharynx is clear. No oropharyngeal exudate.  Eyes:     General: No scleral icterus.    Extraocular Movements: Extraocular movements intact.     Conjunctiva/sclera: Conjunctivae normal.     Pupils: Pupils are equal, round, and reactive to light.  Cardiovascular:     Rate and Rhythm: Normal rate and regular rhythm.     Pulses: Normal pulses.     Heart sounds: Normal heart sounds. No murmur heard.    No friction rub. No gallop.  Pulmonary:     Effort: Pulmonary effort is normal.     Breath sounds: Normal breath sounds. No wheezing, rhonchi or rales.  Abdominal:     General: Bowel sounds  are normal. There is no distension.     Palpations: Abdomen is soft.     Tenderness: There is no abdominal tenderness. There is no guarding or rebound.  Musculoskeletal:        General: No swelling. Normal range of motion.  Skin:    General: Skin is warm and dry.     Comments: Redness/erythema in RLE extending from mid-shin to toes. 2 areas (right big toe and right lateral ankle) with scab formation. Cellulitic areas without tenderness to palpation or active drainage/purulence.  Neurological:     General: No focal deficit present.     Mental Status:  He is alert and oriented to person, place, and time.     Comments: Sensation intact in RLE.  Psychiatric:        Mood and Affect: Mood normal.        Behavior: Behavior normal.          Data Reviewed:  There are no new results to review at this time.  Assessment and Plan: No notes have been filed under this hospital service. Service: Hospitalist  Cellulitis of right lower extremity -Afebrile, no leukocytosis, normal lactate -imaging without signs of osteomyelitis and exam not consistent with necrotizing fasciitis -Failed outpatient doxycycline -Cefazolin 1 g every 8 hours -Blood cultures ordered x 2 -Lovenox for DVT prophylaxis  Hypertension -Blood pressure mildly elevated since admission -Resume home lisinopril (stable kidney function) and Toprol-XL  CKD 3B -Renal function at baseline  Controlled type 2 diabetes -A1c 5.7% in 09/2023 -Continue home Farxiga, holding home glimepiride -Trend CBGs, goal 140-180 -On admission, random blood glucose 146 -Start SSI if CBG is above goal  Hyperlipidemia -Continue home statin  Depression -Continue home Wellbutrin  BPH -Continue home Flomax  Tobacco use disorder -30-pack-year smoking history -Does not plan to quit at this time -Nicotine patch 21 mg daily   Advance Care Planning:   Code Status: Full Code   Consults: none  Family Communication: no family at  bedside  Severity of Illness: The appropriate patient status for this patient is OBSERVATION. Observation status is judged to be reasonable and necessary in order to provide the required intensity of service to ensure the patient's safety. The patient's presenting symptoms, physical exam findings, and initial radiographic and laboratory data in the context of their medical condition is felt to place them at decreased risk for further clinical deterioration. Furthermore, it is anticipated that the patient will be medically stable for discharge from the hospital within 2 midnights of admission.   Author: Briscoe Burns, MD 10/22/2023 7:07 PM  For on call review [email or web address].

## 2023-10-22 NOTE — ED Triage Notes (Signed)
Pt here from home with c/o right lower leg and foot infection , pt has been on antibiotics and being seen by wound care but leg has got worse

## 2023-10-22 NOTE — ED Provider Notes (Signed)
[place] EMERGENCY DEPARTMENT AT [facility] Provider Note   CSN: [id number] Arrival date & time: 10/22/23  1304     History  Chief Complaint  Patient presents with   Wound Infection    KELLEY LEAMING is a 78 y.o. male.  78 year old male presents with increased erythema and swelling to his right calf and foot.  Review of the old chart shows that patient has been started on doxycycline for several days without improvement.  Denies any fever or vomiting.  States that the erythema and edema have now extended from his right foot up into his left lower extremity.  Notes some decrease sensation.  Denies any tension to his thigh.       Home Medications Prior to Admission medications   Medication Sig Start Date End Date Taking? Authorizing Provider  ABRYSVO 120 MCG/0.5ML injection  10/20/22   [provider]  albuterol (VENTOLIN HFA) 108 (90 Base) MCG/ACT inhaler INHALE 1 TO 2 PUFFS BY MOUTH EVERY 4 TO 6 HOURS AS NEEDED FOR BREATHING 09/23/23   Fargo, Amy E, NP  buPROPion (WELLBUTRIN XL) 150 MG 24 hr tablet TAKE 1 TABLET(150 MG) BY MOUTH DAILY 05/24/23   Sharon Seller, NP  dapagliflozin propanediol (FARXIGA) 5 MG TABS tablet Take 1 tablet (5 mg total) by mouth daily before breakfast. 03/17/23   Frederica Kuster, MD  doxycycline (VIBRA-TABS) 100 MG tablet Take 1 tablet (100 mg total) by mouth 2 (two) times daily for 21 days. 10/14/23 11/04/23  Octavia Heir, NP  ferrous sulfate (FEROSUL) 325 (65 FE) MG tablet Take 1 tablet (325 mg total) by mouth 2 (two) times daily with a meal. 04/06/23   Frederica Kuster, MD  fluticasone-salmeterol (ADVAIR) 250-50 MCG/ACT AEPB Inhale 1 puff into the lungs in the morning and at bedtime. 09/23/23   Fargo, Amy E, NP  furosemide (LASIX) 20 MG tablet Take 1 tablet (20 mg total) by mouth 3 (three) times a week. 09/24/23   Fargo, Amy E, NP  glimepiride (AMARYL) 1 MG tablet Take 1 tablet (1 mg total) by mouth daily with breakfast. 05/25/23    Fargo, Amy E, NP  lisinopril (ZESTRIL) 5 MG tablet Take 1 tablet (5 mg total) by mouth daily. 09/24/23 03/22/24  Fargo, Amy E, NP  lovastatin (MEVACOR) 20 MG tablet Take 1 tablet (20 mg total) by mouth at bedtime. 01/22/23   Frederica Kuster, MD  metoprolol succinate (TOPROL-XL) 25 MG 24 hr tablet TAKE 1 TABLET(25 MG) BY MOUTH DAILY 03/18/23   Frederica Kuster, MD  tamsulosin (FLOMAX) 0.4 MG CAPS capsule Take 1 capsule (0.4 mg total) by mouth daily. Take with food 07/12/23   Fargo, Amy E, NP  Tiotropium Bromide Monohydrate (SPIRIVA RESPIMAT) 1.25 MCG/ACT AERS Inhale 2 puffs into the lungs daily. 09/23/23   Octavia Heir, NP      Allergies    Patient has no known allergies.    Review of Systems   Review of Systems  All other systems reviewed and are negative.   Physical Exam Updated Vital Signs BP (!) 141/69 (BP Location: Left Arm)   Pulse 84   Temp 97.9 F (36.6 C) (Oral)   Resp 18   Ht 1.676 m (5\' 6" )   Wt 74.8 kg   SpO2 93%   BMI 26.63 kg/m  Physical Exam Vitals and nursing note reviewed.  Constitutional:      General: He is not in acute distress.    Appearance: Normal appearance.  He is well-developed. He is not toxic-appearing.  HENT:     Head: Normocephalic and atraumatic.  Eyes:     General: Lids are normal.     Conjunctiva/sclera: Conjunctivae normal.     Pupils: Pupils are equal, round, and reactive to light.  Neck:     Thyroid: No thyroid mass.     Trachea: No tracheal deviation.  Cardiovascular:     Rate and Rhythm: Normal rate and regular rhythm.     Heart sounds: Normal heart sounds. No murmur heard.    No gallop.  Pulmonary:     Effort: Pulmonary effort is normal. No respiratory distress.     Breath sounds: Normal breath sounds. No stridor. No decreased breath sounds, wheezing, rhonchi or rales.  Abdominal:     General: There is no distension.     Palpations: Abdomen is soft.     Tenderness: There is no abdominal tenderness. There is no rebound.   Musculoskeletal:        General: No tenderness. Normal range of motion.     Cervical back: Normal range of motion and neck supple.       Legs:  Skin:    General: Skin is warm and dry.     Findings: No abrasion or rash.  Neurological:     Mental Status: He is alert and oriented to person, place, and time. Mental status is at baseline.     GCS: GCS eye subscore is 4. GCS verbal subscore is 5. GCS motor subscore is 6.     Cranial Nerves: No cranial nerve deficit.     Sensory: No sensory deficit.     Motor: Motor function is intact.  Psychiatric:        Attention and Perception: Attention normal.        Speech: Speech normal.        Behavior: Behavior normal.     ED Results / Procedures / Treatments   Labs (all labs ordered are listed, but only abnormal results are displayed) Labs Reviewed  COMPREHENSIVE METABOLIC PANEL - Abnormal; Notable for the following components:      Result Value   Glucose, Bld 146 (*)    BUN 42 (*)    Creatinine, Ser 1.94 (*)    GFR, Estimated 35 (*)    All other components within normal limits  CBC  LACTIC ACID, PLASMA  LACTIC ACID, PLASMA    EKG None  Radiology DG Ankle Complete Right Result Date: 10/22/2023 CLINICAL DATA:  Right lower leg and foot infection EXAM: RIGHT ANKLE - COMPLETE 3+ VIEW; RIGHT FOOT COMPLETE - 3+ VIEW COMPARISON:  10/19/2023 FINDINGS: Right ankle: Frontal, oblique, and lateral views of the right ankle are obtained. No acute displaced fracture, subluxation, or dislocation. Joint spaces are well preserved. Soft tissues are unremarkable. Right foot: Frontal, oblique, and lateral views are obtained. The shallow soft tissue ulceration seen within the plantar aspect of the hindfoot on prior exam is not well visualized on this exam. There is mild subcutaneous edema throughout the right foot, greatest in the forefoot, stable. No subcutaneous gas or radiopaque foreign body. There are no acute or destructive bony abnormalities. Stable  hammertoe of the second PIP. Otherwise alignment is anatomic. Mild midfoot osteoarthritis again noted. IMPRESSION: 1. No acute or destructive bony abnormalities involving the right foot or ankle. 2. The shallow soft tissue ulceration of the plantar aspect of the right hindfoot on prior study is not well visualized on this exam. Stable soft tissue edema  throughout the right foot, greatest in the forefoot. 3. Stable degenerative changes. Electronically Signed   By: Sharlet Salina M.D.   On: 10/22/2023 16:50   DG Foot Complete Right Result Date: 10/22/2023 CLINICAL DATA:  Right lower leg and foot infection EXAM: RIGHT ANKLE - COMPLETE 3+ VIEW; RIGHT FOOT COMPLETE - 3+ VIEW COMPARISON:  10/19/2023 FINDINGS: Right ankle: Frontal, oblique, and lateral views of the right ankle are obtained. No acute displaced fracture, subluxation, or dislocation. Joint spaces are well preserved. Soft tissues are unremarkable. Right foot: Frontal, oblique, and lateral views are obtained. The shallow soft tissue ulceration seen within the plantar aspect of the hindfoot on prior exam is not well visualized on this exam. There is mild subcutaneous edema throughout the right foot, greatest in the forefoot, stable. No subcutaneous gas or radiopaque foreign body. There are no acute or destructive bony abnormalities. Stable hammertoe of the second PIP. Otherwise alignment is anatomic. Mild midfoot osteoarthritis again noted. IMPRESSION: 1. No acute or destructive bony abnormalities involving the right foot or ankle. 2. The shallow soft tissue ulceration of the plantar aspect of the right hindfoot on prior study is not well visualized on this exam. Stable soft tissue edema throughout the right foot, greatest in the forefoot. 3. Stable degenerative changes. Electronically Signed   By: Sharlet Salina M.D.   On: 10/22/2023 16:50    Procedures Procedures    Medications Ordered in ED Medications - No data to display  ED Course/ Medical  Decision Making/ A&P                                 Medical Decision Making Amount and/or Complexity of Data Reviewed Labs: ordered. Radiology: ordered.   Patient here with worsening cellulitis despite being on outpatient therapy.  Will start on IV antibiotics at this time.  No concern for necrotizing fasciitis.  Plan will be to admit to the hospitalist service        Final Clinical Impression(s) / ED Diagnoses Final diagnoses:  None    Rx / DC Orders ED Discharge Orders     None         Lorre Nick, MD 10/22/23 1705

## 2023-10-22 NOTE — Plan of Care (Signed)

## 2023-10-22 NOTE — ED Provider Triage Note (Signed)
Emergency Medicine Provider Triage Evaluation Note  PECOS MIMNAUGH , a 78 y.o. male  was evaluated in triage.  Pt complains of right calf and foot erythema, swelling, warmth for past two weeks. Seen by PCP for wound and provided doxy BID for 21 days and wound care  Review of Systems  Positive: RLE erythema, swelling, warmth Negative: fever  Physical Exam  BP (!) 141/69 (BP Location: Left Arm)   Pulse 84   Temp 97.9 F (36.6 C) (Oral)   Resp 18   Ht 5\' 6"  (1.676 m)   Wt 74.8 kg   SpO2 93%   BMI 26.63 kg/m  Gen:   Awake, no distress   Resp:  Normal effort  MSK:   Moves extremities without difficulty  Other:  Erythematous, 2+ pitting edema, warm to right lower calf that extends into foot  Medical Decision Making  Medically screening exam initiated at 3:30 PM.  Appropriate orders placed.  NAZAIRE SLAYBAUGH was informed that the remainder of the evaluation will be completed by another provider, this initial triage assessment does not replace that evaluation, and the importance of remaining in the ED until their evaluation is complete.  Workup started   Judithann Sheen, [place] 10/22/23 1535

## 2023-10-23 ENCOUNTER — Encounter (HOSPITAL_COMMUNITY): Payer: Medicare HMO

## 2023-10-23 DIAGNOSIS — L02415 Cutaneous abscess of right lower limb: Secondary | ICD-10-CM

## 2023-10-23 DIAGNOSIS — F339 Major depressive disorder, recurrent, unspecified: Secondary | ICD-10-CM | POA: Diagnosis not present

## 2023-10-23 DIAGNOSIS — I70261 Atherosclerosis of native arteries of extremities with gangrene, right leg: Secondary | ICD-10-CM | POA: Diagnosis present

## 2023-10-23 DIAGNOSIS — Z8673 Personal history of transient ischemic attack (TIA), and cerebral infarction without residual deficits: Secondary | ICD-10-CM | POA: Diagnosis not present

## 2023-10-23 DIAGNOSIS — E11621 Type 2 diabetes mellitus with foot ulcer: Secondary | ICD-10-CM | POA: Diagnosis present

## 2023-10-23 DIAGNOSIS — E669 Obesity, unspecified: Secondary | ICD-10-CM | POA: Diagnosis present

## 2023-10-23 DIAGNOSIS — I70221 Atherosclerosis of native arteries of extremities with rest pain, right leg: Secondary | ICD-10-CM | POA: Diagnosis not present

## 2023-10-23 DIAGNOSIS — I739 Peripheral vascular disease, unspecified: Secondary | ICD-10-CM | POA: Diagnosis not present

## 2023-10-23 DIAGNOSIS — H9190 Unspecified hearing loss, unspecified ear: Secondary | ICD-10-CM | POA: Diagnosis present

## 2023-10-23 DIAGNOSIS — L97919 Non-pressure chronic ulcer of unspecified part of right lower leg with unspecified severity: Secondary | ICD-10-CM | POA: Diagnosis not present

## 2023-10-23 DIAGNOSIS — Z87891 Personal history of nicotine dependence: Secondary | ICD-10-CM | POA: Diagnosis not present

## 2023-10-23 DIAGNOSIS — E440 Moderate protein-calorie malnutrition: Secondary | ICD-10-CM | POA: Diagnosis present

## 2023-10-23 DIAGNOSIS — I2510 Atherosclerotic heart disease of native coronary artery without angina pectoris: Secondary | ICD-10-CM | POA: Diagnosis not present

## 2023-10-23 DIAGNOSIS — E119 Type 2 diabetes mellitus without complications: Secondary | ICD-10-CM | POA: Diagnosis not present

## 2023-10-23 DIAGNOSIS — L97519 Non-pressure chronic ulcer of other part of right foot with unspecified severity: Secondary | ICD-10-CM | POA: Diagnosis present

## 2023-10-23 DIAGNOSIS — F32A Depression, unspecified: Secondary | ICD-10-CM | POA: Diagnosis present

## 2023-10-23 DIAGNOSIS — L03115 Cellulitis of right lower limb: Secondary | ICD-10-CM | POA: Diagnosis present

## 2023-10-23 DIAGNOSIS — L0390 Cellulitis, unspecified: Secondary | ICD-10-CM | POA: Diagnosis present

## 2023-10-23 DIAGNOSIS — I70235 Atherosclerosis of native arteries of right leg with ulceration of other part of foot: Secondary | ICD-10-CM | POA: Diagnosis not present

## 2023-10-23 DIAGNOSIS — Z79899 Other long term (current) drug therapy: Secondary | ICD-10-CM | POA: Diagnosis not present

## 2023-10-23 DIAGNOSIS — R58 Hemorrhage, not elsewhere classified: Secondary | ICD-10-CM | POA: Diagnosis not present

## 2023-10-23 DIAGNOSIS — M19071 Primary osteoarthritis, right ankle and foot: Secondary | ICD-10-CM | POA: Diagnosis not present

## 2023-10-23 DIAGNOSIS — I10 Essential (primary) hypertension: Secondary | ICD-10-CM | POA: Diagnosis present

## 2023-10-23 DIAGNOSIS — R600 Localized edema: Secondary | ICD-10-CM | POA: Diagnosis not present

## 2023-10-23 DIAGNOSIS — E1122 Type 2 diabetes mellitus with diabetic chronic kidney disease: Secondary | ICD-10-CM | POA: Diagnosis present

## 2023-10-23 DIAGNOSIS — Z7951 Long term (current) use of inhaled steroids: Secondary | ICD-10-CM | POA: Diagnosis not present

## 2023-10-23 DIAGNOSIS — I509 Heart failure, unspecified: Secondary | ICD-10-CM | POA: Diagnosis not present

## 2023-10-23 DIAGNOSIS — M6281 Muscle weakness (generalized): Secondary | ICD-10-CM | POA: Diagnosis not present

## 2023-10-23 DIAGNOSIS — N401 Enlarged prostate with lower urinary tract symptoms: Secondary | ICD-10-CM | POA: Diagnosis present

## 2023-10-23 DIAGNOSIS — Z7984 Long term (current) use of oral hypoglycemic drugs: Secondary | ICD-10-CM | POA: Diagnosis not present

## 2023-10-23 DIAGNOSIS — N1832 Chronic kidney disease, stage 3b: Secondary | ICD-10-CM | POA: Diagnosis present

## 2023-10-23 DIAGNOSIS — F17210 Nicotine dependence, cigarettes, uncomplicated: Secondary | ICD-10-CM | POA: Diagnosis present

## 2023-10-23 DIAGNOSIS — L97909 Non-pressure chronic ulcer of unspecified part of unspecified lower leg with unspecified severity: Secondary | ICD-10-CM | POA: Diagnosis not present

## 2023-10-23 DIAGNOSIS — N183 Chronic kidney disease, stage 3 unspecified: Secondary | ICD-10-CM | POA: Diagnosis not present

## 2023-10-23 DIAGNOSIS — E1152 Type 2 diabetes mellitus with diabetic peripheral angiopathy with gangrene: Secondary | ICD-10-CM | POA: Diagnosis present

## 2023-10-23 DIAGNOSIS — Z4781 Encounter for orthopedic aftercare following surgical amputation: Secondary | ICD-10-CM | POA: Diagnosis not present

## 2023-10-23 DIAGNOSIS — Z89611 Acquired absence of right leg above knee: Secondary | ICD-10-CM | POA: Diagnosis not present

## 2023-10-23 DIAGNOSIS — L859 Epidermal thickening, unspecified: Secondary | ICD-10-CM | POA: Diagnosis not present

## 2023-10-23 DIAGNOSIS — Z8249 Family history of ischemic heart disease and other diseases of the circulatory system: Secondary | ICD-10-CM | POA: Diagnosis not present

## 2023-10-23 DIAGNOSIS — I70201 Unspecified atherosclerosis of native arteries of extremities, right leg: Secondary | ICD-10-CM | POA: Diagnosis not present

## 2023-10-23 DIAGNOSIS — F419 Anxiety disorder, unspecified: Secondary | ICD-10-CM | POA: Diagnosis present

## 2023-10-23 DIAGNOSIS — J449 Chronic obstructive pulmonary disease, unspecified: Secondary | ICD-10-CM | POA: Diagnosis present

## 2023-10-23 DIAGNOSIS — N400 Enlarged prostate without lower urinary tract symptoms: Secondary | ICD-10-CM | POA: Diagnosis not present

## 2023-10-23 DIAGNOSIS — I130 Hypertensive heart and chronic kidney disease with heart failure and stage 1 through stage 4 chronic kidney disease, or unspecified chronic kidney disease: Secondary | ICD-10-CM | POA: Diagnosis not present

## 2023-10-23 DIAGNOSIS — E1165 Type 2 diabetes mellitus with hyperglycemia: Secondary | ICD-10-CM | POA: Diagnosis present

## 2023-10-23 DIAGNOSIS — E785 Hyperlipidemia, unspecified: Secondary | ICD-10-CM | POA: Diagnosis present

## 2023-10-23 LAB — CBC
HCT: 50.6 % (ref 39.0–52.0)
Hemoglobin: 15.5 g/dL (ref 13.0–17.0)
MCH: 28.2 pg (ref 26.0–34.0)
MCHC: 30.6 g/dL (ref 30.0–36.0)
MCV: 92.2 fL (ref 80.0–100.0)
Platelets: 177 10*3/uL (ref 150–400)
RBC: 5.49 MIL/uL (ref 4.22–5.81)
RDW: 14.6 % (ref 11.5–15.5)
WBC: 7.3 10*3/uL (ref 4.0–10.5)
nRBC: 0 % (ref 0.0–0.2)

## 2023-10-23 LAB — BASIC METABOLIC PANEL
Anion gap: 7 (ref 5–15)
BUN: 38 mg/dL — ABNORMAL HIGH (ref 8–23)
CO2: 30 mmol/L (ref 22–32)
Calcium: 8.9 mg/dL (ref 8.9–10.3)
Chloride: 103 mmol/L (ref 98–111)
Creatinine, Ser: 1.7 mg/dL — ABNORMAL HIGH (ref 0.61–1.24)
GFR, Estimated: 41 mL/min — ABNORMAL LOW (ref >60)
Glucose, Bld: 87 mg/dL (ref 70–99)
Potassium: 4.1 mmol/L (ref 3.5–5.1)
Sodium: 140 mmol/L (ref 135–145)

## 2023-10-23 LAB — GLUCOSE, CAPILLARY
Glucose-Capillary: 127 mg/dL — ABNORMAL HIGH (ref 70–99)
Glucose-Capillary: 79 mg/dL (ref 70–99)

## 2023-10-23 LAB — SEDIMENTATION RATE: Sed Rate: 9 mm/h (ref 0–16)

## 2023-10-23 LAB — C-REACTIVE PROTEIN: CRP: 3.4 mg/dL — ABNORMAL HIGH (ref <1.0)

## 2023-10-23 LAB — PREALBUMIN: Prealbumin: 18 mg/dL (ref 18–38)

## 2023-10-23 MED ORDER — CEFTRIAXONE SODIUM 2 G IJ SOLR
2.0000 g | INTRAMUSCULAR | Status: DC
  Administered 2023-10-23 – 2023-10-28 (×6): 2 g via INTRAVENOUS
  Filled 2023-10-23 (×6): qty 20

## 2023-10-23 MED ORDER — ALBUTEROL SULFATE (2.5 MG/3ML) 0.083% IN NEBU: 2.5000 mg | INHALATION_SOLUTION | RESPIRATORY_TRACT | Status: DC | PRN

## 2023-10-23 MED ORDER — MOMETASONE FURO-FORMOTEROL FUM 200-5 MCG/ACT IN AERO
2.0000 | INHALATION_SPRAY | Freq: Two times a day (BID) | RESPIRATORY_TRACT | Status: DC
  Administered 2023-10-23 – 2023-11-03 (×19): 2 via RESPIRATORY_TRACT
  Filled 2023-10-23 (×2): qty 8.8

## 2023-10-23 MED ORDER — INSULIN ASPART 100 UNIT/ML IJ SOLN
0.0000 [IU] | Freq: Three times a day (TID) | INTRAMUSCULAR | Status: DC
  Administered 2023-10-26 (×2): 1 [IU] via SUBCUTANEOUS
  Administered 2023-10-27: 3 [IU] via SUBCUTANEOUS
  Administered 2023-10-27 (×2): 1 [IU] via SUBCUTANEOUS
  Administered 2023-10-28: 2 [IU] via SUBCUTANEOUS
  Administered 2023-10-29: 1 [IU] via SUBCUTANEOUS
  Administered 2023-10-30 – 2023-11-02 (×7): 2 [IU] via SUBCUTANEOUS
  Administered 2023-11-03: 1 [IU] via SUBCUTANEOUS
  Administered 2023-11-03: 2 [IU] via SUBCUTANEOUS

## 2023-10-23 MED ORDER — UMECLIDINIUM BROMIDE 62.5 MCG/ACT IN AEPB
1.0000 | INHALATION_SPRAY | Freq: Every day | RESPIRATORY_TRACT | Status: DC
  Administered 2023-10-24 – 2023-11-03 (×9): 1 via RESPIRATORY_TRACT
  Filled 2023-10-23 (×3): qty 7

## 2023-10-23 MED ORDER — METRONIDAZOLE 500 MG/100ML IV SOLN
500.0000 mg | Freq: Two times a day (BID) | INTRAVENOUS | Status: DC
  Administered 2023-10-23 – 2023-10-29 (×11): 500 mg via INTRAVENOUS
  Filled 2023-10-23 (×12): qty 100

## 2023-10-23 MED ORDER — TIZANIDINE HCL 2 MG PO TABS
2.0000 mg | ORAL_TABLET | Freq: Once | ORAL | Status: AC
  Administered 2023-10-23: 2 mg via ORAL
  Filled 2023-10-23: qty 1

## 2023-10-23 NOTE — Progress Notes (Signed)
Progress Note   Patient: Clarence Maldonado XBJ:[id number] DOB: 05-Dec-1945 DOA: 10/22/2023     0 DOS: the patient was seen and examined on 10/23/2023   Brief hospital course: 78yo with h/o stage 3b CKD, DM, HTN, HLD, and BPH who presented with RLE cellulitis in the setting of failed outpatient therapy with doxycycline.  He was started on Cefazolin, ABIs ordered.   Assessment and Plan:  Cellulitis of right lower extremity Patient reports R medial great toe ulceration that has not healed, followed by development of excoriation along lateral ankle, followed by diffuse R lower leg erythema Patient with h/o prior antibiotic treatment with doxycycline and so has failed outpatient management Xrays performed on 1/14 and again on 1/18 without evidence of bony involvement Negative lactate, no current concerns for sepsis Will treat with IV antibiotics (Rocephin/Flagyl as per the lower extremity wound algorithm); he was given Cefazolin at the time of admission Patient appears to be at risk for amputation I have ordered ABIs in case revascularization may be indicated Will order MRI of foot and tib/fib LE wound order set utilized including labs (CRP, ESR, A1c, prealbumin, HIV, and blood cultures) and consults (diabetes coordinator; peripheral vascular navigator; TOC team; wound care; and nutrition)  Blood cultures negative  x 12 hours Lovenox for DVT prophylaxis   Hypertension Continue Toprol-XL Hold Farxiga and lisinopril due to renal concerns   CKD 3B Baseline creatinine 1.7-1.9 Appears to be stable at this time Attempt to avoid nephrotoxic medications Hold Farxiga and lisinopril for now Recheck BMP in AM    Controlled type 2 diabetes A1c 5.7 in 09/2023 Hold Farxiga, glimepiride Will cover with sensitive-scale SSI   Hyperlipidemia Continue lovastatin   Depression Continue bupropion   BPH Continue tamsulosin   Tobacco use disorder with COPD 30-pack-year smoking history Does not  plan to quit at this time Nicotine patch 21 mg daily Continue Advair (Dulera per formulary) and Spiriva, add prn Albuterol    Consultants: None  Procedures: ABI  Antibiotics: Cefazolin 1/17-18 Ceftriaxone 1/18- Metronidazole 1/18-      Subjective: Concerned about the possibility of losing his leg, understands the gravity of a non-healing foot/ankle infection.   Objective: Vitals:   10/23/23 1016 10/23/23 1311  BP: 139/63 128/74  Pulse: 81 77  Resp: 16 16  Temp: 97.6 F (36.4 C) 97.6 F (36.4 C)  SpO2: 91% 94%    Intake/Output Summary (Last 24 hours) at 10/23/2023 1630 Last data filed at 10/23/2023 3086 Gross per 24 hour  Intake 330 ml  Output 450 ml  Net -120 ml   Filed Weights   10/22/23 1435 10/22/23 1930  Weight: 74.8 kg 65 kg    Exam:  General:  Appears calm and comfortable and is in NAD Eyes:  EOMI, normal lids, iris ENT:  grossly normal hearing, lips & tongue, mmm Neck:  no LAD, masses or thyromegaly Cardiovascular:  RRR, no m/r/g. No LE edema.  Respiratory:   CTA bilaterally with no wheezes/rales/rhonchi.  Normal respiratory effort. Abdomen:  soft, NT, ND Skin:  diffuse RLE erythema with tightened skin concerning for circulatory issues; R medial great toe with ulceration; R lateral ankle with diffuse ulceration        Musculoskeletal:  grossly normal tone BUE/BLE, good ROM, no bony abnormality other than as above Psychiatric:  blunted mood and affect, speech fluent and appropriate, AOx3 Neurologic:  CN 2-12 grossly intact, moves all extremities in coordinated fashion   Data Reviewed: I have reviewed the patient's lab results since  admission.  Pertinent labs for today include:   BUN 38/Creatinine 1.7/GFR 41 - stable Lactate 1.5, 1.2 Normal CBC Blood cultures pending     Family Communication: None present; we tried to call his friend [name]) while I was in the room and I tried again later in the day without  success  Disposition: Status is: Inpatient Admit - It is my clinical opinion that admission to INPATIENT is reasonable and necessary because of the expectation that this patient will require hospital care that crosses at least 2 midnights to treat this condition based on the medical complexity of the problems presented.  Given the aforementioned information, the predictability of an adverse outcome is felt to be significant.      Time spent: 50 minutes  Unresulted Labs (From admission, onward)     Start     Ordered   10/24/23 0500  CBC with Differential/Platelet  Tomorrow morning,   R       Question:  Specimen collection method  Answer:  Lab=Lab collect   10/23/23 1629   10/24/23 0500  Basic metabolic panel  Tomorrow morning,   R       Question:  Specimen collection method  Answer:  Lab=Lab collect   10/23/23 1629   10/23/23 1613  Sedimentation rate  Once,   R       Question:  Specimen collection method  Answer:  Lab=Lab collect   10/23/23 1613   10/23/23 1613  C-reactive protein  Once,   R       Question:  Specimen collection method  Answer:  Lab=Lab collect   10/23/23 1613   10/23/23 1613  Prealbumin  Once,   R       Question:  Specimen collection method  Answer:  Lab=Lab collect   10/23/23 1613             Author: Jonah Blue, MD 10/23/2023 4:30 PM  For on call review [email or web address].

## 2023-10-24 ENCOUNTER — Inpatient Hospital Stay (HOSPITAL_COMMUNITY): Payer: Medicare HMO

## 2023-10-24 DIAGNOSIS — I70235 Atherosclerosis of native arteries of right leg with ulceration of other part of foot: Secondary | ICD-10-CM | POA: Diagnosis not present

## 2023-10-24 DIAGNOSIS — L02415 Cutaneous abscess of right lower limb: Secondary | ICD-10-CM | POA: Diagnosis not present

## 2023-10-24 DIAGNOSIS — L97909 Non-pressure chronic ulcer of unspecified part of unspecified lower leg with unspecified severity: Secondary | ICD-10-CM

## 2023-10-24 DIAGNOSIS — L03115 Cellulitis of right lower limb: Secondary | ICD-10-CM | POA: Diagnosis not present

## 2023-10-24 LAB — GLUCOSE, CAPILLARY
Glucose-Capillary: 101 mg/dL — ABNORMAL HIGH (ref 70–99)
Glucose-Capillary: 93 mg/dL (ref 70–99)
Glucose-Capillary: 134 mg/dL — ABNORMAL HIGH (ref 70–99)
Glucose-Capillary: 171 mg/dL — ABNORMAL HIGH (ref 70–99)

## 2023-10-24 LAB — CBC WITH DIFFERENTIAL/PLATELET
Abs Immature Granulocytes: 0.03 10*3/uL (ref 0.00–0.07)
Basophils Absolute: 0.1 10*3/uL (ref 0.0–0.1)
Basophils Relative: 1 %
Eosinophils Absolute: 0.3 10*3/uL (ref 0.0–0.5)
Eosinophils Relative: 3 %
HCT: 51.2 % (ref 39.0–52.0)
Hemoglobin: 15.4 g/dL (ref 13.0–17.0)
Immature Granulocytes: 0 %
Lymphocytes Relative: 12 %
Lymphs Abs: 0.9 10*3/uL (ref 0.7–4.0)
MCH: 28.2 pg (ref 26.0–34.0)
MCHC: 30.1 g/dL (ref 30.0–36.0)
MCV: 93.6 fL (ref 80.0–100.0)
Monocytes Absolute: 0.7 10*3/uL (ref 0.1–1.0)
Monocytes Relative: 9 %
Neutro Abs: 6 10*3/uL (ref 1.7–7.7)
Neutrophils Relative %: 75 %
Platelets: 196 10*3/uL (ref 150–400)
RBC: 5.47 MIL/uL (ref 4.22–5.81)
RDW: 14.6 % (ref 11.5–15.5)
WBC: 8 10*3/uL (ref 4.0–10.5)
nRBC: 0 % (ref 0.0–0.2)

## 2023-10-24 LAB — BASIC METABOLIC PANEL
Anion gap: 7 (ref 5–15)
BUN: 34 mg/dL — ABNORMAL HIGH (ref 8–23)
CO2: 27 mmol/L (ref 22–32)
Calcium: 8.7 mg/dL — ABNORMAL LOW (ref 8.9–10.3)
Chloride: 102 mmol/L (ref 98–111)
Creatinine, Ser: 1.53 mg/dL — ABNORMAL HIGH (ref 0.61–1.24)
GFR, Estimated: 47 mL/min — ABNORMAL LOW (ref >60)
Glucose, Bld: 69 mg/dL — ABNORMAL LOW (ref 70–99)
Potassium: 4.7 mmol/L (ref 3.5–5.1)
Sodium: 136 mmol/L (ref 135–145)

## 2023-10-24 MED ORDER — GADOBUTROL 1 MMOL/ML IV SOLN
6.0000 mL | Freq: Once | INTRAVENOUS | Status: AC | PRN
  Administered 2023-10-24: 6 mL via INTRAVENOUS

## 2023-10-24 MED ORDER — ENOXAPARIN SODIUM 40 MG/0.4ML IJ SOSY
40.0000 mg | PREFILLED_SYRINGE | INTRAMUSCULAR | Status: DC
  Administered 2023-10-24 – 2023-11-02 (×10): 40 mg via SUBCUTANEOUS
  Filled 2023-10-24 (×10): qty 0.4

## 2023-10-24 NOTE — Progress Notes (Signed)
Mobility Specialist - Progress Note   10/24/23 1456  Mobility  Activity Ambulated with assistance in hallway  Level of Assistance Standby assist, set-up cues, supervision of patient - no hands on  Assistive Device Front wheel walker  Distance Ambulated (ft) 160 ft  Activity Response Tolerated well  Mobility Referral Yes  Mobility visit 1 Mobility  Mobility Specialist Start Time (ACUTE ONLY) 1427  Mobility Specialist Stop Time (ACUTE ONLY) 1455  Mobility Specialist Time Calculation (min) (ACUTE ONLY) 28 min   Pt received in bed and agreeable to mobility. No complaints during session. Pt to bed after session with all needs met.    [facility]

## 2023-10-24 NOTE — Progress Notes (Signed)
ABI exam has been completed.  Preliminary findings given to Dr. Ophelia Charter.   Results can be found under chart review under CV PROC. 10/24/2023 10:42 AM Eustace Hur RVT, RDMS

## 2023-10-24 NOTE — Consult Note (Signed)
Hospital Consult    Reason for Consult: CLI with tissue loss Referring Physician: Hospitalist, Dr. Ophelia Charter MRN #:  [id number]  History of Present Illness: This is a 79 y.o. male with history of stage III CKD, diabetes, hypertension, hyperlipidemia who was initially admitted to [facility] with right leg cellulitis.  Subsequent ABIs were 0.43 on the right monophasic and 0.7 on the left monophasic.  Patient was transferred to [facility] for vascular surgery evaluation.  Patient states has had a wound on his right foot for several months.  No previous vascular inventions.  Past Medical History:  Diagnosis Date   Abnormal CT of the chest 2009   nodule 9 x 6 mm RUL   Benign localized hyperplasia of prostate with urinary obstruction and other lower urinary tract symptoms (LUTS)(600.21)    Cardiomegaly    Cerebrovascular accident (stroke) (HCC) 2009   lacunar infarct in the left thalamus   Cholelithiasis    Chronic airway obstruction, not elsewhere classified    Chronic kidney disease, stage III (moderate) (HCC)    Closed femur fracture (HCC) 1960   Contact with or exposure to viral disease    suspected    Illiterate    Obesity, unspecified    Other and unspecified hyperlipidemia    Special screening examination for respiratory tuberculosis    Tobacco use disorder    Tuberculosis    Type II or unspecified type diabetes mellitus with renal manifestations, uncontrolled(250.42)    Unspecified essential hypertension     Past Surgical History:  Procedure Laterality Date   FEMUR FRACTURE SURGERY Left 1960    No Known Allergies  Prior to Admission medications   Medication Sig Start Date End Date Taking? Authorizing Provider  albuterol (VENTOLIN HFA) 108 (90 Base) MCG/ACT inhaler INHALE 1 TO 2 PUFFS BY MOUTH EVERY 4 TO 6 HOURS AS NEEDED FOR BREATHING 09/23/23   Fargo, Amy E, NP  buPROPion (WELLBUTRIN XL) 150 MG 24 hr tablet TAKE 1 TABLET(150 MG) BY MOUTH DAILY 05/24/23   Sharon Seller, NP  dapagliflozin propanediol (FARXIGA) 5 MG TABS tablet Take 1 tablet (5 mg total) by mouth daily before breakfast. 03/17/23   Frederica Kuster, MD  ferrous sulfate (FEROSUL) 325 (65 FE) MG tablet Take 1 tablet (325 mg total) by mouth 2 (two) times daily with a meal. 04/06/23   Frederica Kuster, MD  fluticasone-salmeterol (ADVAIR) 250-50 MCG/ACT AEPB Inhale 1 puff into the lungs in the morning and at bedtime. 09/23/23   Fargo, Amy E, NP  furosemide (LASIX) 20 MG tablet Take 1 tablet (20 mg total) by mouth 3 (three) times a week. 09/24/23   Fargo, Amy E, NP  glimepiride (AMARYL) 1 MG tablet Take 1 tablet (1 mg total) by mouth daily with breakfast. 05/25/23   Fargo, Amy E, NP  lisinopril (ZESTRIL) 5 MG tablet Take 1 tablet (5 mg total) by mouth daily. 09/24/23 03/22/24  Fargo, Amy E, NP  lovastatin (MEVACOR) 20 MG tablet Take 1 tablet (20 mg total) by mouth at bedtime. 01/22/23   Frederica Kuster, MD  metoprolol succinate (TOPROL-XL) 25 MG 24 hr tablet TAKE 1 TABLET(25 MG) BY MOUTH DAILY 03/18/23   Frederica Kuster, MD  tamsulosin (FLOMAX) 0.4 MG CAPS capsule Take 1 capsule (0.4 mg total) by mouth daily. Take with food 07/12/23   Fargo, Amy E, NP  Tiotropium Bromide Monohydrate (SPIRIVA RESPIMAT) 1.25 MCG/ACT AERS Inhale 2 puffs into the lungs daily. 09/23/23   Octavia Heir, NP  Social History   Socioeconomic History   Marital status: Single    Spouse name: Not on file   Number of children: Not on file   Years of education: Not on file   Highest education level: Not on file  Occupational History   Not on file  Tobacco Use   Smoking status: Some Days    Current packs/day: 0.50    Average packs/day: 0.5 packs/day for 60.0 years (30.0 ttl pk-yrs)    Types: Cigarettes   Smokeless tobacco: Never  Vaping Use   Vaping status: Never Used  Substance and Sexual Activity   Alcohol use: Not Currently   Drug use: No   Sexual activity: Not on file  Other Topics Concern   Not on file  Social  History Narrative   Not on file   Social Drivers of Health   Financial Resource Strain: Low Risk  (03/23/2019)   Overall Financial Resource Strain (CARDIA)    Difficulty of Paying Living Expenses: Not hard at all  Food Insecurity: No Food Insecurity (03/23/2019)   Hunger Vital Sign    Worried About Running Out of Food in the Last Year: Never true    Ran Out of Food in the Last Year: Never true  Transportation Needs: No Transportation Needs (03/23/2019)   PRAPARE - Administrator, Civil Service (Medical): No    Lack of Transportation (Non-Medical): No  Physical Activity: Inactive (03/23/2019)   Exercise Vital Sign    Days of Exercise per Week: 0 days    Minutes of Exercise per Session: 0 min  Stress: No Stress Concern Present (03/23/2019)   [employer] of Occupational Health - Occupational Stress Questionnaire    Feeling of Stress : Not at all  Social Connections: Socially Isolated (03/23/2019)   Social Connection and Isolation Panel [NHANES]    Frequency of Communication with Friends and Family: Once a week    Frequency of Social Gatherings with Friends and Family: Once a week    Attends Religious Services: Never    Database administrator or Organizations: No    Attends Banker Meetings: Never    Marital Status: Never married  Intimate Partner Violence: Not At Risk (03/23/2019)   Humiliation, Afraid, Rape, and Kick questionnaire    Fear of Current or Ex-Partner: No    Emotionally Abused: No    Physically Abused: No    Sexually Abused: No     Family History  Problem Relation Age of Onset   Heart disease Mother     ROS: [x]  Positive   [ ]  Negative   [ ]  All sytems reviewed and are negative  Cardiovascular: []  chest pain/pressure []  palpitations []  SOB lying flat []  DOE []  pain in legs while walking []  pain in legs at rest []  pain in legs at night []  non-healing ulcers []  hx of DVT []  swelling in legs  Pulmonary: []  productive cough []   asthma/wheezing []  home O2  Neurologic: []  weakness in []  arms []  legs []  numbness in []  arms []  legs []  hx of CVA []  mini stroke [] difficulty speaking or slurred speech []  temporary loss of vision in one eye []  dizziness  Hematologic: []  hx of cancer []  bleeding problems []  problems with blood clotting easily  Endocrine:   []  diabetes []  thyroid disease  GI []  vomiting blood []  blood in stool  GU: []  CKD/renal failure []  HD--[]  M/W/F or []  T/T/S []  burning with urination []  blood in urine  Psychiatric: []  anxiety []  depression  Musculoskeletal: []  arthritis []  joint pain  Integumentary: []  rashes []  ulcers  Constitutional: []  fever []  chills   Physical Examination  Vitals:   10/24/23 1406 10/24/23 1600  BP: 127/60 (!) 155/77  Pulse: 72 69  Resp: 16 16  Temp: 97.9 F (36.6 C) 98.9 F (37.2 C)  SpO2: 97% 96%   Body mass index is 23.85 kg/m.  General:  WDWN in NAD Gait: Not observed HENT: WNL, normocephalic Pulmonary: normal non-labored breathing Cardiac: regular, without  Murmurs, rubs or gallops Abdomen:  soft, NT/ND Vascular Exam/Pulses: Bilateral common femoral pulses palpable No palpable pedal pulses Right lower extremity tissue loss as pictured Musculoskeletal: no muscle wasting or atrophy  Neurologic: A&O X 3; Appropriate Affect ; SENSATION: normal; MOTOR FUNCTION:  moving all extremities equally. Speech is fluent/normal    CBC    Component Value Date/Time   WBC 8.0 10/24/2023 0443   RBC 5.47 10/24/2023 0443   HGB 15.4 10/24/2023 0443   HCT 51.2 10/24/2023 0443   HCT 32.2 (L) 05/30/2019 1552   PLT 196 10/24/2023 0443   MCV 93.6 10/24/2023 0443   MCH 28.2 10/24/2023 0443   MCHC 30.1 10/24/2023 0443   RDW 14.6 10/24/2023 0443   RDW 14.9 01/07/2015 0959   LYMPHSABS 0.9 10/24/2023 0443   LYMPHSABS 2.1 01/07/2015 0959   MONOABS 0.7 10/24/2023 0443   EOSABS 0.3 10/24/2023 0443   EOSABS 0.3 01/07/2015 0959   BASOSABS 0.1  10/24/2023 0443   BASOSABS 0.1 01/07/2015 0959    BMET    Component Value Date/Time   NA 136 10/24/2023 0443   NA 141 01/08/2016 0905   K 4.7 10/24/2023 0443   CL 102 10/24/2023 0443   CO2 27 10/24/2023 0443   GLUCOSE 69 (L) 10/24/2023 0443   BUN 34 (H) 10/24/2023 0443   BUN 26 01/08/2016 0905   CREATININE 1.53 (H) 10/24/2023 0443   CREATININE 1.93 (H) 09/23/2023 1528   CALCIUM 8.7 (L) 10/24/2023 0443   GFRNONAA 47 (L) 10/24/2023 0443   GFRNONAA 46 (L) 06/13/2020 1222   GFRAA 54 (L) 06/13/2020 1222    COAGS: Lab Results  Component Value Date   INR 1.0 01/26/2020     Non-Invasive Vascular Imaging:    ABI 0.43 right monophasic and 0.70 left monophasic    ASSESSMENT/PLAN: This is a 78 y.o. male with history of stage III CKD, diabetes, hypertension, hyperlipidemia who was initially admitted to [facility] with right leg cellulitis.  Subsequent ABIs were 0.43 on the right monophasic and 0.7 on the left monophasic.  Patient was transferred to [facility] for vascular surgery evaluation.  Presentation consistent with critical limb ischemia with tissue loss of the right lower extremity.  I discussed aortogram, lower extremity arteriogram with a focus on the right leg to look for any inflow disease that may be treatable with endovascular intervention.  Will schedule tomorrow in the Cath Lab.  Discussed n.p.o. at midnight.  Consent order placed.  All questions answered.  Discussed if he has a long segment occlusion may ultimately require surgical bypass but we need to evaluate his anatomy first.  Discussed all of this is to improve inflow to optimize chances of wound healing.  Risk and benefits discussed including risk transfemoral access.  Cephus Shelling, MD [facility] Office: [phone]  Cephus Shelling

## 2023-10-24 NOTE — Plan of Care (Signed)

## 2023-10-24 NOTE — Progress Notes (Addendum)
1430- Report called to [place] [id number]   sv/pcu   kristy hinshaw Rn    1440-Called report to [facility] non emergent transport   gave report to - angie cox Rn  1528-   [facility] at bedside    no acute distress noted at this time   patient has all belongings   such as wallet  cell phone  jacket  charger  with him  [facility] staff at bedside while pt accounted for personal possessions All tx documents reviewed by charge christa Rn and [facility] team   md aware    pt cleared and en route

## 2023-10-24 NOTE — Progress Notes (Signed)
Progress Note   Patient: Clarence Maldonado ZOX:[id number] DOB: Feb 11, 1946 DOA: 10/22/2023     1 DOS: the patient was seen and examined on 10/24/2023   Brief hospital course: 77yo with h/o stage 3b CKD, DM, HTN, HLD, and BPH who presented with RLE cellulitis in the setting of failed outpatient therapy with doxycycline.  He was started on Cefazolin, ABIs ordered.   Assessment and Plan:  Critical limb ischemia of right lower extremity Patient reports R medial great toe ulceration that has not healed, followed by development of excoriation along lateral ankle, followed by diffuse R lower leg erythema Patient with h/o prior antibiotic treatment with doxycycline and so has failed outpatient management Xrays performed on 1/14 and again on 1/18 without evidence of bony involvement Negative lactate, no current concerns for sepsis Will treat with IV antibiotics (Rocephin/Flagyl as per the lower extremity wound algorithm); he was given Cefazolin at the time of admission Patient appears to be at risk for amputation ABIs ordered due to concern for ischemia; these show severe RLE arterial disease with absent R toe-brachial index as well as moderate LLE arterial disease with abnormal toe-brachial index Needs transfer to [facility] for vascular surgery consultation; Dr. Chestine Spore is aware and is planning for angiogram Will order MRI of foot and tib/fib at [facility] LE wound order set utilized including labs (CRP, ESR, A1c, prealbumin, HIV, and blood cultures) and consults (diabetes coordinator; peripheral vascular navigator; TOC team; wound care; and nutrition)  Blood cultures negative  x 2 days Lovenox for DVT prophylaxis   Hypertension Continue Toprol-XL Hold Farxiga and lisinopril due to renal concerns   CKD 3B Baseline creatinine 1.7-1.9 Appears to be stable at this time Attempt to avoid nephrotoxic medications Hold Farxiga and lisinopril for now Recheck BMP in AM    Controlled type 2 diabetes A1c 5.7 in  09/2023 Hold Farxiga, glimepiride Will cover with sensitive-scale SSI   Hyperlipidemia Continue lovastatin   Depression Continue bupropion   BPH Continue tamsulosin   Tobacco use disorder with COPD 30-pack-year smoking history Does not plan to quit at this time Nicotine patch 21 mg daily Continue Advair (Dulera per formulary) and Spiriva, add prn Albuterol       Consultants: None   Procedures: ABI   Antibiotics: Cefazolin 1/17-18 Ceftriaxone 1/18- Metronidazole 1/18-      30 Day Unplanned Readmission Risk Score    Flowsheet Row ED to Hosp-Admission (Current) from 10/22/2023 in [facility]  30 Day Unplanned Readmission Risk Score (%) 15.94 Filed at 10/24/2023 0801       This score is the patient's risk of an unplanned readmission within 30 days of being discharged (0 -100%). The score is based on dignosis, age, lab data, medications, orders, and past utilization.   Low:  0-14.9   Medium: 15-21.9   High: 22-29.9   Extreme: 30 and above           Subjective: Patient is concerned about losing his leg, understands the severity of his condition.  He is willing to transfer to [facility] for vascular consult.   Objective: Vitals:   10/24/23 1036 10/24/23 1406  BP: 129/71 127/60  Pulse: 66 72  Resp:  16  Temp: 97.8 F (36.6 C) 97.9 F (36.6 C)  SpO2: 98% 97%    Intake/Output Summary (Last 24 hours) at 10/24/2023 1543 Last data filed at 10/24/2023 0610 Gross per 24 hour  Intake 354.14 ml  Output 800 ml  Net -445.86 ml  Filed Weights   10/22/23 1435 10/22/23 1930  Weight: 74.8 kg 65 kg    Exam:  General:  Appears calm and comfortable and is in NAD Eyes:  EOMI, normal lids, iris ENT:  grossly normal hearing, lips & tongue, mmm Neck:  no LAD, masses or thyromegaly Cardiovascular:  RRR, no m/r/g. No LE edema.  Respiratory:   CTA bilaterally with no wheezes/rales/rhonchi.  Normal respiratory effort. Abdomen:  soft,  NT, ND Skin:  diffuse RLE erythema with tightened skin concerning for circulatory issues; R medial great toe with ulceration; R lateral ankle with diffuse ulceration, very poor capillary refill Musculoskeletal:  grossly normal tone BUE/BLE, good ROM, no bony abnormality other than as above Psychiatric:  blunted mood and affect, speech fluent and appropriate, AOx3, emotionally labile when talking about medical situation Neurologic:  CN 2-12 grossly intact, moves all extremities in coordinated fashion  Data Reviewed: I have reviewed the patient's lab results since admission.  Pertinent labs for today include:   Glucose 69, 101 BUN 34/Creatinine 1.53/GFR 47, stable Normal CBC Blood cultures NTD    Family Communication: None present; we spoke with his friend [name] by telephone while in the room to update him on the transfer plan  Disposition: Status is: Inpatient Remains inpatient appropriate because: ongoing management     Time spent: 50 minutes  Unresulted Labs (From admission, onward)     Start     Ordered   Unscheduled  CBC with Differential/Platelet  Tomorrow morning,   R       Question:  Specimen collection method  Answer:  Lab=Lab collect   10/24/23 1543   Unscheduled  Basic metabolic panel  Tomorrow morning,   R       Question:  Specimen collection method  Answer:  Lab=Lab collect   10/24/23 1543             Author: Jonah Blue, MD 10/24/2023 3:43 PM  For on call review [email or web address].

## 2023-10-25 ENCOUNTER — Encounter (HOSPITAL_COMMUNITY)
Admission: EM | Disposition: A | Discharge status: Skilled Nursing Facility | Payer: Self-pay | Source: Home / Self Care | Attending: Internal Medicine

## 2023-10-25 DIAGNOSIS — I70221 Atherosclerosis of native arteries of extremities with rest pain, right leg: Secondary | ICD-10-CM

## 2023-10-25 DIAGNOSIS — I70235 Atherosclerosis of native arteries of right leg with ulceration of other part of foot: Secondary | ICD-10-CM | POA: Diagnosis not present

## 2023-10-25 DIAGNOSIS — L03115 Cellulitis of right lower limb: Secondary | ICD-10-CM | POA: Diagnosis not present

## 2023-10-25 HISTORY — PX: ABDOMINAL AORTOGRAM W/LOWER EXTREMITY: CATH118223

## 2023-10-25 LAB — CBC WITH DIFFERENTIAL/PLATELET
Abs Immature Granulocytes: 0.04 10*3/uL (ref 0.00–0.07)
Basophils Absolute: 0.1 10*3/uL (ref 0.0–0.1)
Basophils Relative: 1 %
Eosinophils Absolute: 0.3 10*3/uL (ref 0.0–0.5)
Eosinophils Relative: 4 %
HCT: 48.6 % (ref 39.0–52.0)
Hemoglobin: 15.5 g/dL (ref 13.0–17.0)
Immature Granulocytes: 1 %
Lymphocytes Relative: 16 %
Lymphs Abs: 1.3 10*3/uL (ref 0.7–4.0)
MCH: 28.7 pg (ref 26.0–34.0)
MCHC: 31.9 g/dL (ref 30.0–36.0)
MCV: 90 fL (ref 80.0–100.0)
Monocytes Absolute: 0.8 10*3/uL (ref 0.1–1.0)
Monocytes Relative: 10 %
Neutro Abs: 5.4 10*3/uL (ref 1.7–7.7)
Neutrophils Relative %: 68 %
Platelets: 186 10*3/uL (ref 150–400)
RBC: 5.4 MIL/uL (ref 4.22–5.81)
RDW: 14.4 % (ref 11.5–15.5)
WBC: 7.9 10*3/uL (ref 4.0–10.5)
nRBC: 0 % (ref 0.0–0.2)

## 2023-10-25 LAB — GLUCOSE, CAPILLARY
Glucose-Capillary: 96 mg/dL (ref 70–99)
Glucose-Capillary: 93 mg/dL (ref 70–99)
Glucose-Capillary: 84 mg/dL (ref 70–99)
Glucose-Capillary: 70 mg/dL (ref 70–99)
Glucose-Capillary: 111 mg/dL — ABNORMAL HIGH (ref 70–99)
Glucose-Capillary: 123 mg/dL — ABNORMAL HIGH (ref 70–99)

## 2023-10-25 LAB — BASIC METABOLIC PANEL
Anion gap: 10 (ref 5–15)
BUN: 29 mg/dL — ABNORMAL HIGH (ref 8–23)
CO2: 26 mmol/L (ref 22–32)
Calcium: 9 mg/dL (ref 8.9–10.3)
Chloride: 101 mmol/L (ref 98–111)
Creatinine, Ser: 1.36 mg/dL — ABNORMAL HIGH (ref 0.61–1.24)
GFR, Estimated: 54 mL/min — ABNORMAL LOW (ref >60)
Glucose, Bld: 94 mg/dL (ref 70–99)
Potassium: 4.8 mmol/L (ref 3.5–5.1)
Sodium: 137 mmol/L (ref 135–145)

## 2023-10-25 SURGERY — ABDOMINAL AORTOGRAM W/LOWER EXTREMITY
Anesthesia: LOCAL

## 2023-10-25 MED ORDER — JUVEN PO PACK
1.0000 | PACK | Freq: Two times a day (BID) | ORAL | Status: DC
  Administered 2023-10-25 – 2023-11-03 (×15): 1 via ORAL
  Filled 2023-10-25 (×15): qty 1

## 2023-10-25 MED ORDER — ENSURE ENLIVE PO LIQD
237.0000 mL | Freq: Two times a day (BID) | ORAL | Status: DC
Start: 2023-10-25 — End: 2023-11-03
  Administered 2023-10-26 – 2023-11-03 (×10): 237 mL via ORAL

## 2023-10-25 MED ORDER — SODIUM CHLORIDE 0.9 % WEIGHT BASED INFUSION
1.0000 mL/kg/h | INTRAVENOUS | Status: DC
  Administered 2023-10-25: 1 mL/kg/h via INTRAVENOUS

## 2023-10-25 MED ORDER — MIDAZOLAM HCL 2 MG/2ML IJ SOLN
INTRAMUSCULAR | Status: AC
  Filled 2023-10-25: qty 2

## 2023-10-25 MED ORDER — FENTANYL CITRATE (PF) 100 MCG/2ML IJ SOLN
INTRAMUSCULAR | Status: AC
  Filled 2023-10-25: qty 2

## 2023-10-25 MED ORDER — LIDOCAINE HCL (PF) 1 % IJ SOLN
INTRAMUSCULAR | Status: AC
Start: 2023-10-25 — End: ?
  Filled 2023-10-25: qty 30

## 2023-10-25 MED ORDER — HEPARIN (PORCINE) IN NACL 1000-0.9 UT/500ML-% IV SOLN
INTRAVENOUS | Status: DC | PRN
  Administered 2023-10-25 (×2): 500 mL

## 2023-10-25 MED ORDER — LIDOCAINE HCL (PF) 1 % IJ SOLN
INTRAMUSCULAR | Status: DC | PRN
  Administered 2023-10-25: 12 mL

## 2023-10-25 MED ORDER — HYDRALAZINE HCL 20 MG/ML IJ SOLN: 5.0000 mg | INTRAMUSCULAR | Status: DC | PRN

## 2023-10-25 MED ORDER — LABETALOL HCL 5 MG/ML IV SOLN: 10.0000 mg | INTRAVENOUS | Status: DC | PRN

## 2023-10-25 MED ORDER — SODIUM CHLORIDE 0.9 % IV SOLN
250.0000 mL | INTRAVENOUS | Status: AC | PRN
Start: 2023-10-26 — End: 2023-10-27

## 2023-10-25 MED ORDER — SODIUM CHLORIDE 0.9% FLUSH
3.0000 mL | Freq: Two times a day (BID) | INTRAVENOUS | Status: DC
  Administered 2023-10-26 – 2023-11-03 (×15): 3 mL via INTRAVENOUS

## 2023-10-25 MED ORDER — SODIUM CHLORIDE 0.9 % IV SOLN
INTRAVENOUS | Status: DC
Start: 2023-10-25 — End: 2023-10-27

## 2023-10-25 MED ORDER — FENTANYL CITRATE (PF) 100 MCG/2ML IJ SOLN
INTRAMUSCULAR | Status: DC | PRN
  Administered 2023-10-25: 50 ug via INTRAVENOUS

## 2023-10-25 MED ORDER — IODIXANOL 320 MG/ML IV SOLN
INTRAVENOUS | Status: DC | PRN
  Administered 2023-10-25: 25 mL

## 2023-10-25 MED ORDER — SODIUM CHLORIDE 0.9% FLUSH: 3.0000 mL | INTRAVENOUS | Status: DC | PRN

## 2023-10-25 MED ORDER — HYDRALAZINE HCL 10 MG PO TABS: 10.0000 mg | ORAL_TABLET | Freq: Four times a day (QID) | ORAL | Status: DC | PRN

## 2023-10-25 MED ORDER — ONDANSETRON HCL 4 MG/2ML IJ SOLN: 4.0000 mg | Freq: Four times a day (QID) | INTRAMUSCULAR | Status: DC | PRN

## 2023-10-25 MED ORDER — ACETAMINOPHEN 325 MG PO TABS
650.0000 mg | ORAL_TABLET | ORAL | Status: DC | PRN
  Administered 2023-10-27: 650 mg via ORAL
  Filled 2023-10-25: qty 2

## 2023-10-25 MED ORDER — ADULT MULTIVITAMIN W/MINERALS CH
1.0000 | ORAL_TABLET | Freq: Every day | ORAL | Status: DC
  Administered 2023-10-25 – 2023-11-03 (×9): 1 via ORAL
  Filled 2023-10-25 (×9): qty 1

## 2023-10-25 MED ORDER — MIDAZOLAM HCL 2 MG/2ML IJ SOLN
INTRAMUSCULAR | Status: DC | PRN
  Administered 2023-10-25: 1 mg via INTRAVENOUS

## 2023-10-25 SURGICAL SUPPLY — 11 items
BAG SNAP BAND KOVER 36X36 (MISCELLANEOUS) IMPLANT
CLOSURE MYNX CONTROL 5F (Vascular Products) IMPLANT
COVER DOME SNAP 22 D (MISCELLANEOUS) IMPLANT
GLIDEWIRE ADV .035X260CM (WIRE) IMPLANT
KIT ANGIASSIST CO2 SYSTEM (KITS) IMPLANT
KIT MICROPUNCTURE NIT STIFF (SHEATH) IMPLANT
KIT PV (KITS) ×1 IMPLANT
SET ATX-X65L (MISCELLANEOUS) IMPLANT
SHEATH PINNACLE 5F 10CM (SHEATH) IMPLANT
SHEATH PROBE COVER 6X72 (BAG) IMPLANT
WIRE BENTSON .035X145CM (WIRE) IMPLANT

## 2023-10-25 NOTE — Progress Notes (Addendum)
PROGRESS NOTE    Clarence Maldonado  ZOX:[id number] DOB: [birth year] DOA: 10/22/2023 PCP: Octavia Heir, NP    Brief Narrative:   Clarence Maldonado is a 78 y.o. male with past medical history significant for HTN, HLD, DM2, CKD stage IIIb, COPD, BPH, tobacco use disorder who presented to [facility] ED on 10/21/2022 from home with concern about right lower leg infection.  Patient reports he had a scratch to his right great toe a few weeks ago that became an open wound with scab.  Following he started having skin breakdown to his right lateral ankle.  He saw his PCP January 9 and was prescribed doxycycline.  Despite outpatient antibiotic use, he did not see any improvement of his symptoms and sought further care in the ED.  He denies fever, no chills, no nausea/vomiting, no chest pain, no shortness of breath, no urinary symptoms.  In the ED, temperature 97.9 F, HR 84, RR 18, BP 141/69, SpO2 93% on room air.  WBC 8.2, hemoglobin 15.9, platelet count 191.  Sodium 139, potassium 4.1, chloride 100, CO2 30, glucose 146, BUN 42, creat 1.94.  AST 17, ALT 11, total bilirubin 0.5.  Lactic acid 1.5.  Right foot/ankle x-ray with no acute or destructive bony abnormalities to right foot/ankle, shallow soft tissue ulceration plantar aspect of the right hindfoot not well-visualized, stable soft tissue edema throughout the right foot greatest in the forefoot.  TRH was consulted for admission for further evaluation management of cellulitis right lower extremity with failed outpatient antibiotics.  Patient was initially admitted at [facility], started on IV antibiotics.  Given abnormal ABIs concerning for critical limb ischemia; vascular surgery was consulted and patient was transferred to [facility] for further evaluation, management and need of angiogram.  Assessment & Plan:   Right lower extremity critical limb ischemia Right lower extremity cellulitis with wound Patient presenting to ED  with progressive ulceration, redness to his right lower extremity despite outpatient antibiotic use with doxycycline.  Patient was afebrile without leukocytosis.  Right foot/ankle x-rays with no bony destruction, notable for soft tissue edema.  MR right foot/tibia/fibula with circumferential subcutaneous edema right lower extremity, no abscess, no osteomyelitis, no deep fascial fluid collection, no myositis.  ABI notable for severe right lower extremity arterial disease and moderate left lower extremity arterial disease. -- Vascular surgery following, appreciate assistance -- Ceftriaxone 2 g IV every 24 hours -- Metronidazole 5 mg IV every 12 hours -- N.p.o., pending angiogram today  Essential hypertension -- Metoprolol succinate 25 mg p.o. daily -- Hold home lisinopril -- Hydralazine 10 mg PO q6h PRN SBP >170  Hyperlipidemia -- Check lipid panel -- Pravastatin 20 mg p.o. daily (lovasatatin 20mg  PO daily at home)  Type 2 diabetes mellitus Hemoglobin A1c 5.11 September 2023, well-controlled. -- Hold home Farxiga and glipizide -- SSI for coverage -- CBGs qAC/HS  CKD stage IIIb -- Cr 1.94>>1.53>1.36 (baseline 1.8 - 1.8 past year) -- Holding home lisinopril -- Avoid nephrotoxins, renal dose all medications -- BMP daily  COPD, not in acute exacerbation -- Dulera 2 puff BID (substituted for home Advair) -- Incruse Ellipta 1 puff daily (substituted for home Spiriva) -- Albuterol neb q4h PRN wheezing/shortness of breath  BPH -- Tamsulosin 0.4 mg p.o. daily  Anxiety/depression -- Bupropion 150 mg p.o. daily  Tobacco use disorder -- Counseled on need for complete cessation/abstinence   DVT prophylaxis: enoxaparin (LOVENOX) injection 40 mg Start: 10/24/23 2200    Code Status: Full Code  Family Communication: No family present at bedside this morning  Disposition Plan:  Level of care: Telemetry Medical Status is: Inpatient Remains inpatient appropriate because: Pending angiogram,  IV antibiotics    Consultants:  Vascular surgery  Procedures:  Abdominal/lower extremity angiogram: Pending  Antimicrobials:  Ceftriaxone 1/18>> Metronidazole 1/18>> Cefazolin 1/17 - 1/18   Subjective: Patient seen examined bedside, resting calmly.  Sitting at edge of bed.  States he is "hungry".  Pending angiogram this afternoon.  Continues report pain, swelling and redness to right lower extremity.  Remains on IV antibiotics.  No other specific complaints, concerns or questions at this time.  Denies headache, no dizziness, no chest pain, no shortness of breath, no abdominal pain, no fever/chills, no nausea/vomiting/diarrhea, no focal weakness, no fatigue, no paresthesia.  No acute events overnight per nursing staff.  Objective: Vitals:   10/24/23 1931 10/24/23 2322 10/25/23 0423 10/25/23 0829  BP: 122/71 (!) 153/72 (!) 149/80 139/84  Pulse:  96 84 75  Resp: 18 19 18 16   Temp: 97.9 F (36.6 C) 98 F (36.7 C) 97.7 F (36.5 C) 97.6 F (36.4 C)  TempSrc: Oral Oral Oral Oral  SpO2: 96% 95% 96% 95%  Weight:      Height:        Intake/Output Summary (Last 24 hours) at 10/25/2023 1030 Last data filed at 10/25/2023 0539 Gross per 24 hour  Intake 287.47 ml  Output 300 ml  Net -12.53 ml   Filed Weights   10/22/23 1435 10/22/23 1930  Weight: 74.8 kg 65 kg    Examination:  Physical Exam: GEN: NAD, alert and oriented x 3, chronically ill appearance, appears older than stated age HEENT: NCAT, PERRL, EOMI, sclera clear, dry mucous membranes PULM: CTAB w/o wheezes/crackles, normal respiratory effort, on room air CV: RRR w/o M/G/R GI: abd soft, NTND, NABS, no R/G/M MSK: RLE with ulceration to great toe, ankle with erythema, edema, muscle strength intact bilaterally upper/lower extremities  NEURO: CN II-XII intact, no focal deficits, sensation to light touch intact PSYCH: normal mood/affect Integumentary: Right lower extremity with ulceration the great toe/ankle with  erythema/edema as depicted below, otherwise no other concerning rashes/lesions/wounds noted on exposed skin surfaces.             Data Reviewed: I have personally reviewed following labs and imaging studies  CBC: Recent Labs  Lab 10/22/23 1525 10/23/23 0512 10/24/23 0443 10/25/23 0318  WBC 8.2 7.3 8.0 7.9  NEUTROABS  --   --  6.0 5.4  HGB 15.9 15.5 15.4 15.5  HCT 51.6 50.6 51.2 48.6  MCV 93.1 92.2 93.6 90.0  PLT 191 177 196 186   Basic Metabolic Panel: Recent Labs  Lab 10/22/23 1525 10/23/23 0512 10/24/23 0443 10/25/23 0318  NA 139 140 136 137  K 4.1 4.1 4.7 4.8  CL 100 103 102 101  CO2 30 30 27 26   GLUCOSE 146* 87 69* 94  BUN 42* 38* 34* 29*  CREATININE 1.94* 1.70* 1.53* 1.36*  CALCIUM 9.3 8.9 8.7* 9.0   GFR: Estimated Creatinine Clearance: 40.2 mL/min (A) (by C-G formula based on SCr of 1.36 mg/dL (H)). Liver Function Tests: Recent Labs  Lab 10/22/23 1525  AST 17  ALT 11  ALKPHOS 69  BILITOT 0.5  PROT 7.4  ALBUMIN 3.6   No results for input(s): "LIPASE", "AMYLASE" in the last 168 hours. No results for input(s): "AMMONIA" in the last 168 hours. Coagulation Profile: No results for input(s): "INR", "PROTIME" in the last 168 hours. Cardiac  Enzymes: No results for input(s): "CKTOTAL", "CKMB", "CKMBINDEX", "TROPONINI" in the last 168 hours. BNP (last 3 results) No results for input(s): "PROBNP" in the last 8760 hours. HbA1C: No results for input(s): "HGBA1C" in the last 72 hours. CBG: Recent Labs  Lab 10/24/23 0744 10/24/23 1221 10/24/23 1639 10/24/23 2044 10/25/23 0632  GLUCAP 101* 93 134* 171* 93   Lipid Profile: No results for input(s): "CHOL", "HDL", "LDLCALC", "TRIG", "CHOLHDL", "LDLDIRECT" in the last 72 hours. Thyroid Function Tests: No results for input(s): "TSH", "T4TOTAL", "FREET4", "T3FREE", "THYROIDAB" in the last 72 hours. Anemia Panel: No results for input(s): "VITAMINB12", "FOLATE", "FERRITIN", "TIBC", "IRON", "RETICCTPCT" in  the last 72 hours. Sepsis Labs: Recent Labs  Lab 10/22/23 1525 10/22/23 2009  LATICACIDVEN 1.5 1.2    Recent Results (from the past 240 hours)  Culture, blood (Routine X 2) w Reflex to ID Panel     Status: None (Preliminary result)   Collection Time: 10/22/23  6:55 PM   Specimen: BLOOD  Result Value Ref Range Status   Specimen Description   Final    BLOOD BLOOD RIGHT ARM Performed at [facility], [address], [place], [place] [address]    Special Requests   Final    BOTTLES DRAWN AEROBIC AND ANAEROBIC Blood Culture adequate volume Performed at [facility], [address] [place], [place] [address]    Culture   Final    NO GROWTH 3 DAYS Performed at [facility], [address] [place], [place] [address]    Report Status PENDING  Incomplete  Culture, blood (Routine X 2) w Reflex to ID Panel     Status: None (Preliminary result)   Collection Time: 10/22/23  8:09 PM   Specimen: BLOOD  Result Value Ref Range Status   Specimen Description   Final    BLOOD BLOOD LEFT ARM Performed at [facility], [address] [place], [place] [address]    Special Requests   Final    BOTTLES DRAWN AEROBIC ONLY Blood Culture adequate volume Performed at [facility], [address] [place], [place] [address]    Culture   Final    NO GROWTH 3 DAYS Performed at [facility], [address], [place], [place] [address]    Report Status PENDING  Incomplete         Radiology Studies: MR FOOT RIGHT W WO CONTRAST Result Date: 10/24/2023 CLINICAL DATA:  Soft tissue infection suspected, foot, xray done EXAM: MRI OF THE RIGHT FOREFOOT WITHOUT AND WITH CONTRAST TECHNIQUE: Multiplanar, multisequence MR imaging of the right forefoot was performed before and after the administration of intravenous contrast. CONTRAST:  6mL GADAVIST GADOBUTROL 1 MMOL/ML IV SOLN COMPARISON:  X-ray 10/22/2023 FINDINGS:  Bones/Joint/Cartilage There are multiple sites of increased T2 signal within the bone marrow of the distal forefoot, difficult to determine if these represent sites of true bone marrow edema or artifactually increased signal secondary to poor fat saturation at the edge of the field of view. Involved sites include the distal phalanx of the second toe, third toe, and fourth toe as well as the fifth toe proximal phalanx and adjacent fifth metatarsal head. Faintly increased T2 marrow signal is also present within the distal phalanx of the great toe. Subchondral bone marrow edema within the first metatarsal head is secondary to reactive changes from mild osteoarthritis. There is no evidence of erosion or confluent low T1 marrow signal changes at the above described locations.  No fracture or dislocation. No joint effusions. Ligaments Intact Lisfranc ligament.  Intact collateral ligaments. Muscles and Tendons Diffuse edema-like signal of the foot musculature which may represent changes related to denervation and/or myositis. Mild fatty infiltration of the foot musculature. No tenosynovitis. Soft tissues Mild subcutaneous edema over the dorsum of the foot. No organized or rim enhancing fluid collections. IMPRESSION: 1. Multiple sites of increased T2 signal within the bone marrow of the distal forefoot. It is difficult to determine if these represent sites of true bone marrow edema or artifactually increased signal secondary to poor fat saturation at the edge of the field of view. Involved sites include the distal phalanx of the second toe, third toe, and fourth toe as well as the fifth toe proximal phalanx and adjacent fifth metatarsal head. Faintly increased T2 marrow signal is also present within the distal phalanx of the great toe. No evidence of erosion or confluent low T1 marrow signal changes at the above described locations to suggest acute osteomyelitis. Reactive osteitis not excluded. Correlate with physical exam.  2. Diffuse edema-like signal of the foot musculature which may represent changes related to denervation and/or myositis. 3. Mild subcutaneous edema over the dorsum of the foot. No organized or rim enhancing fluid collections. Electronically Signed   By: Duanne Guess D.O.   On: 10/24/2023 19:46   MR TIBIA FIBULA RIGHT W WO CONTRAST Result Date: 10/24/2023 CLINICAL DATA:  Soft tissue infection suspected, lower leg, xray done EXAM: MRI OF LOWER RIGHT EXTREMITY WITHOUT AND WITH CONTRAST TECHNIQUE: Multiplanar, multisequence MR imaging of the right lower leg was performed both before and after administration of intravenous contrast. CONTRAST:  6mL GADAVIST GADOBUTROL 1 MMOL/ML IV SOLN COMPARISON:  X-ray right ankle 10/22/2023 FINDINGS: Bones/Joint/Cartilage Right tibia and fibula are intact. No fracture. No malalignment. Small bone infarctions within the distal tibial metaphysis. Mild degenerative changes of the tibiotalar joint, not well assessed at the edge of the field of view. Mild focal subchondral marrow edema at the talar dome. No significant tibiotalar joint effusion. No extra-articular sites of bone marrow edema. No erosion. No marrow replacing bone lesion. Ligaments Not well assessed at the edges of the field of view. No obvious abnormality. Muscles and Tendons Mild fatty infiltration of the lower leg musculature. Faint perifascial edema tracking along the tibiofibular syndesmosis within the mid to distal lower leg. No perifascial fluid collections. No intramuscular fluid collections. Imaged tendinous structures are intact. No tenosynovitis. Soft tissues Mild circumferential subcutaneous edema of the right lower leg. No organized or rim enhancing fluid collection. IMPRESSION: 1. Mild circumferential subcutaneous edema of the right lower leg, which may represent cellulitis in the appropriate clinical setting. No organized or rim enhancing fluid collection to suggest abscess. 2. Faint perifascial edema  tracking along the tibiofibular syndesmosis within the mid to distal lower leg, which may be posttraumatic or reflect a mild nonspecific fasciitis. No deep fascial fluid collections or evidence of significant myositis. 3. No evidence of osteomyelitis. Electronically Signed   By: Duanne Guess D.O.   On: 10/24/2023 19:36   VAS [place] ABI WITH/WO TBI Result Date: 10/24/2023  LOWER EXTREMITY DOPPLER STUDY Patient Name:  Clarence Maldonado  Date of Exam:   10/24/2023 Medical Rec #: [id number]         Accession #:    [id number] Date of Birth: [birth year]         Patient Gender: M Patient Age:   37 years Exam Location:  [facility] Procedure:  VAS [place] ABI WITH/WO TBI Referring Phys: JENNIFER YATES --------------------------------------------------------------------------------  Indications: Rest pain, and ulceration. High Risk Factors: Hypertension, hyperlipidemia, Diabetes, current smoker, prior                    CVA. Other Factors: CKD.  Comparison Study: No previous exams Performing Technologist: Hill, Jody RVT, RDMS  Examination Guidelines: A complete evaluation includes at minimum, Doppler waveform signals and systolic blood pressure reading at the level of bilateral brachial, anterior tibial, and posterior tibial arteries, when vessel segments are accessible. Bilateral testing is considered an integral part of a complete examination. Photoelectric Plethysmograph (PPG) waveforms and toe systolic pressure readings are included as required and additional duplex testing as needed. Limited examinations for reoccurring indications may be performed as noted.  ABI Findings: +---------+------------------+-----+-------------------+--------+ Right    Rt Pressure (mmHg)IndexWaveform           Comment  +---------+------------------+-----+-------------------+--------+ Brachial 125                    triphasic                   +---------+------------------+-----+-------------------+--------+ PTA      9                  0.07 dampened monophasic         +---------+------------------+-----+-------------------+--------+ DP       54                0.43 dampened monophasic         +---------+------------------+-----+-------------------+--------+ Great Toe0                 0.00 Absent                      +---------+------------------+-----+-------------------+--------+ +---------+------------------+-----+----------+-------+ Left     Lt Pressure (mmHg)IndexWaveform  Comment +---------+------------------+-----+----------+-------+ Brachial 120                    triphasic         +---------+------------------+-----+----------+-------+ PTA      88                0.70 monophasic        +---------+------------------+-----+----------+-------+ DP       82                0.66 monophasic        +---------+------------------+-----+----------+-------+ Great Toe21                0.17 Abnormal          +---------+------------------+-----+----------+-------+  Summary: Right: Resting right ankle-brachial index indicates severe right lower extremity arterial disease. The right toe-brachial index is absent. Left: Resting left ankle-brachial index indicates moderate left lower extremity arterial disease. The left toe-brachial index is abnormal. *See table(s) above for measurements and observations.  Suggest Peripheral Vascular Consult. Electronically signed by Sherald Hess MD on 10/24/2023 at 12:29:59 PM.    Final         Scheduled Meds:  buPROPion  150 mg Oral Daily   enoxaparin (LOVENOX) injection  40 mg Subcutaneous Q24H   insulin aspart  0-9 Units Subcutaneous TID WC   metoprolol succinate  25 mg Oral Daily   mometasone-formoterol  2 puff Inhalation BID   nicotine  21 mg Transdermal Daily   pravastatin  20 mg Oral q1800   tamsulosin  0.4 mg Oral Daily   umeclidinium bromide  1 puff Inhalation  Daily   Continuous Infusions:  sodium chloride 100 mL/hr at 10/25/23 0538    cefTRIAXone (ROCEPHIN)  IV 2 g (10/24/23 2048)   And   metronidazole 500 mg (10/25/23 0539)     LOS: 2 days    Time spent: 52 minutes spent on chart review, discussion with nursing staff, consultants, updating family and interview/physical exam; more than 50% of that time was spent in counseling and/or coordination of care.    Alvira Philips [place], DO [facility] Available via Epic secure chat 7am-7pm After these hours, please refer to coverage provider listed on [email or web address] 10/25/2023, 10:30 AM

## 2023-10-25 NOTE — Progress Notes (Signed)
With patient's permission, RN spoke with roommate/caretaker, [name], with regard to patient's status.  Mr. [name] and patient request physician call Mr. [name] with information about treatment plan and options going forward. Phone numbers are on chart.

## 2023-10-25 NOTE — Progress Notes (Signed)
Initial Nutrition Assessment  DOCUMENTATION CODES:   Non-severe (moderate) malnutrition in context of social or environmental circumstances  INTERVENTION:   Recommend regular diet after procedure  Ensure Enlive po BID, each supplement provides 350 kcal and 20 grams of protein. 1 packet Juven BID, each packet provides 95 calories, 2.5 grams of protein (collagen), and to support wound healing Double proteins with meals MVI with minerals daily  Recommend updated weight  Provided wound healing education and  "High-Calorie High-Protein Nutrition Therapy" handout   NUTRITION DIAGNOSIS:   Moderate Malnutrition related to social / environmental circumstances as evidenced by severe muscle depletion, moderate fat depletion.   GOAL:   Patient will meet greater than or equal to 90% of their needs   MONITOR:   PO intake, Weight trends, Supplement acceptance, Skin  REASON FOR ASSESSMENT:   Consult Wound healing  ASSESSMENT:  78 y.o male with PMH of HTN. HLD. T2DM, CKD IIIb, COPD, BPH, tobacco use. Transferred from [facility] for right lower leg infection.  1/20 - aortogram with right lower extremity angiogram    Patient lying in bed on visit he states he is "hungry" he has been NPO since midnight. PTA he states he was only eating 1 meal/day and snacking on foods such as cake and ice cream. His one meal would typically be pinto beans, mashed potatoes, collard greens, and a protein. Does occasionally drink juice and sweet tea. He reports drinking 1 alcoholic drink every other day. He states he used to weigh 250 lbs years ago and now he reports his usual body weight to be around 150-160 lbs.   Discussed with patient the importance of protein and energy intake during wound healing. Provided examples on ways to increase caloric density of foods and beverages frequently consumed by the patient. Also provided ideas to promote variety and to incorporate additional nutrient dense foods into  patient's diet. Discussed eating small frequent meals and snacks to assist in increasing overall po intake. Patient willing to try Ensures and Juven.  He was very hesitant to increase his frequency of meals because "he did not want to be 250 pounds again." Encouraged patient to try to have at least 2-3 meals per day as he needs extra calories and protein for wound healing. Provided "High-Calorie High-Protein Nutrition Therapy" handout   Question weight accuracy this admission. Per weight history he is around 155 lbs. Two weights on 1/17 show 165 lbs and then 1443 lbs. Recommend updated weight.   Patient qualifies for moderate malnutrition but depending on new weight may qualify for severe.   Admit weight: 65 kg  Current weight: 65 kg    10/22/23 65 kg  10/14/23 71.2 kg  09/23/23 70 kg  06/24/23 70.5 kg  02/17/23 70.6 kg  01/06/23 72.3 kg  09/16/22 73 kg  03/17/22 67 kg  10/21/21 73.2 kg  04/15/21 74.2 kg   Average Meal Intake: No meals recorded   Nutritionally Relevant Medications: Scheduled Meds:  [MAR Hold] insulin aspart  0-9 Units Subcutaneous TID WC   [MAR Hold] metoprolol succinate  25 mg Oral Daily   [MAR Hold] mometasone-formoterol  2 puff Inhalation BID   [MAR Hold] nicotine  21 mg Transdermal Daily   [MAR Hold] pravastatin  20 mg Oral q1800   [MAR Hold] tamsulosin  0.4 mg Oral Daily   [MAR Hold] umeclidinium bromide  1 puff Inhalation Daily   Continuous Infusions:  sodium chloride 100 mL/hr at 10/25/23 0538   [MAR Hold] cefTRIAXone (ROCEPHIN)  IV  2 g (10/24/23 2048)   And   [MAR Hold] metronidazole 500 mg (10/25/23 0539)   Labs Reviewed: BUN 29, Creatinine 1.36 CBG ranges from 84-171 mg/dL over the last 24 hours HgbA1c 5.7   NUTRITION - FOCUSED PHYSICAL EXAM:  Flowsheet Row Most Recent Value  Orbital Region Moderate depletion  Upper Arm Region Severe depletion  Thoracic and Lumbar Region Moderate depletion  Buccal Region Moderate depletion  Temple Region  Moderate depletion  Clavicle Bone Region Severe depletion  Clavicle and Acromion Bone Region Severe depletion  Scapular Bone Region Severe depletion  Dorsal Hand Severe depletion  Patellar Region Severe depletion  Anterior Thigh Region Severe depletion  Posterior Calf Region Severe depletion  Edema (RD Assessment) None  Hair Reviewed  Eyes Reviewed  Mouth Reviewed  Skin Reviewed  [RLE wound]  Nails Reviewed       Diet Order:   Diet Order             Diet NPO time specified  Diet effective midnight                   EDUCATION NEEDS:   Education needs have been addressed  Skin:  Skin Assessment: Skin Integrity Issues: Skin Integrity Issues:: Incisions, Other (Comment) Incisions: Right ankle insision Other: RLE wound, R toe wound  Last BM:  1/20 type 6  Height:   Ht Readings from Last 1 Encounters:  10/22/23 5\' 5"  (1.651 m)    Weight:   Wt Readings from Last 1 Encounters:  10/22/23 65 kg    Ideal Body Weight:  61.8 kg  BMI:  Body mass index is 23.85 kg/m.  Estimated Nutritional Needs:   Kcal:  1600-1800 kcal  Protein:  80-100 gm  Fluid:  >1.6L   Elliot Dally, RD Registered Dietitian  See Amion for more information

## 2023-10-25 NOTE — Patient Outreach (Signed)
  Care Coordination   Initial Visit Note   10/20/2023 Name: Clarence Maldonado MRN: [id number] DOB: December 29, 1945  Clarence Maldonado is a 78 y.o. year old male who sees [place], Amy E, NP for primary care. I spoke with  Gerrit Friends Porreca's friend, [name] by phone today.  What matters to the patients health and wellness today?  Level of Care and Supportive Resources    Goals Addressed             This Visit's Progress    Obtain Supportive Resources-Guardianship   On track    Activities and task to complete in order to accomplish goals.   Keep all upcoming appointments discussed today Continue with compliance of taking medication prescribed by Doctor Implement healthy coping skills discussed to assist with management of symptoms Follow up with Department of Social Services Nadyne Coombes [phone] previous guardian         SDOH assessments and interventions completed:  Yes  SDOH Interventions Today    Flowsheet Row Most Recent Value  SDOH Interventions   Food Insecurity Interventions Intervention Not Indicated  Housing Interventions Intervention Not Indicated  Transportation Interventions Intervention Not Indicated, Patient Resources (Friends/Family)  Utilities Interventions Intervention Not Indicated        Care Coordination Interventions:  Yes, provided  Interventions Today    Flowsheet Row Most Recent Value  Chronic Disease   Chronic disease during today's visit Diabetes, Chronic Kidney Disease/End Stage Renal Disease (ESRD)  General Interventions   General Interventions Discussed/Reviewed General Interventions Discussed, Community Resources, Doctor Visits, Level of Care  Doctor Visits Discussed/Reviewed Doctor Visits Discussed  Level of Care Assisted Living, Personal Care Services  Mental Health Interventions   Mental Health Discussed/Reviewed Mental Health Discussed, Coping Strategies  Nutrition Interventions   Nutrition Discussed/Reviewed Nutrition Discussed   Pharmacy Interventions   Pharmacy Dicussed/Reviewed Pharmacy Topics Discussed, Medication Adherence  Safety Interventions   Safety Discussed/Reviewed Safety Discussed  Advanced Directive Interventions   Advanced Directives Discussed/Reviewed Guardianship  Guardianship Provide resources       Follow up plan: Follow up call scheduled for 1-2 weeks    Encounter Outcome:  Patient Visit Completed   Jenel Lucks, LCSW [place]  [facility], [facility] Clinical Social Worker Direct Dial: [phone]  Fax: [phone] Website: [email or web address] 5:05 AM

## 2023-10-25 NOTE — Progress Notes (Signed)
  Daily Progress Note  Subjective: No complaints  Objective: Vitals:   10/25/23 0829 10/25/23 1134  BP: 139/84 118/61  Pulse: 75 73  Resp: 16 18  Temp: 97.6 F (36.4 C) 98.3 F (36.8 C)  SpO2: 95% 95%    Physical Examination No acute distress Hemodynamically stable Nonlabored breathing Right lower extremity wounds as pictured    ASSESSMENT/PLAN:   This is a 78 y.o. male with history of stage III CKD, diabetes, hypertension, hyperlipidemia who was initially admitted to [facility] with right leg cellulitis.  Subsequent ABIs were 0.43 on the right monophasic and 0.7 on the left monophasic.  Chronic limb threatening ischemia of the right lower extremity with tissue loss.  Reviewed the risks and benefits of angiogram with intervention via a left femoral artery access.  Patient expressed understanding was willing to proceed.  To Cath Lab for aortogram with right lower extremity angiogram   Daria Pastures MD MS [facility] [phone] 10/25/2023  2:12 PM

## 2023-10-25 NOTE — Progress Notes (Signed)
EOS: Pt alert to self, place, situation, but forgetful overnight.  RLE doppler pulse very weak.

## 2023-10-25 NOTE — Op Note (Addendum)
    Patient name: Clarence Maldonado MRN: [id number] DOB: 01-10-1946 Sex: male  10/25/2023 Pre-operative Diagnosis: Critical limb threatening ischemia of right leg with tissue loss Post-operative diagnosis:  Same Surgeon:  Daria Pastures, MD Procedure Performed:  Ultrasound-guided access of left common femoral artery Aortogram and right lower extremity angiogram Second order cannulation of right external iliac artery Mynx closure of left common femoral artery 37 minutes moderate sedation  Indications:  Mr. Pons is a 78 year old male with history of stage III CKD, diabetes, hypertension and hyperlipidemia who was admitted with right leg and great toe wounds with an ABI of 0.43.  Risk and benefits of angiogram with possible intervention were reviewed, patient expressed understanding and elected to proceed.  Findings:  Widely patent aorta and bilateral renal arteries.  Approximately 50% stenosis of the right common iliac artery.  Bilateral external iliac arteries with moderate calcific disease although no apparent stenosis. Right common femoral and profunda patent.  There appears to be a short segment of patent SFA although shortly occludes and there is a long segment CTO involving the SFA and popliteal artery which do not reconstitute.  The AT reconstitutes via profunda collaterals and is the only runoff to the foot.  There is very late reconstitution of the PT.  There is very diminutive filling of the pedal vasculature.   Procedure:  The patient was identified in the holding area and taken to the cath lab  The patient was then placed supine on the table and prepped and draped in the usual sterile fashion.  A time out was called.  Ultrasound was used to evaluate the left common femoral artery.  It was patent .  A digital ultrasound image was acquired.  A micropuncture needle was used to access the left common femoral artery under ultrasound guidance.  An 018 wire was advanced without resistance  and a micropuncture sheath was placed.  The 018 wire was removed and a benson wire was placed.  The micropuncture sheath was exchanged for a 5 french sheath.  An omniflush catheter was advanced over the wire to the level of L-1.  An abdominal angiogram was obtained.  Next, using the omniflush catheter and a glide advantage wire, the aortic bifurcation was crossed and the catheter was placed into theright external iliac artery and right runoff was obtained. This demonstrated the above findings.  The Omni Flush catheter was removed and a minx closure device was deployed in the left common femoral artery.  Contrast: 25cc  Sedation: 37 minutes  Impression: Severe infrainguinal occlusive disease with a long segment CTO of the SFA and popliteal.  AT reconstitution although minimal filling of pedal vasculature.   Daria Pastures MD [facility] [place] Office: [phone]

## 2023-10-25 NOTE — Patient Instructions (Signed)
Visit Information  Thank you for taking time to visit with me today. Please don't hesitate to contact me if I can be of assistance to you before our next scheduled telephone appointment.  Following are the goals we discussed today:  (Copy and paste patient goals from clinical care plan here)  Please call the care guide team at [phone] if you need to cancel or reschedule your appointment.   If you are experiencing a Mental Health or Behavioral Health Crisis or need someone to talk to, please call the Suicide and Crisis Lifeline: 988 call 911   Following is a copy of your full plan of care:  There are no care plans that you recently modified to display for this patient.   Mr. Alatorre was given information about Care Management services by the embedded care coordination team including:  Care Management services include personalized support from designated clinical staff supervised by his physician, including individualized plan of care and coordination with other care providers 24/7 contact phone numbers for assistance for urgent and routine care needs. The patient may stop CCM services at any time (effective at the end of the month) by phone call to the office staff.  Patient agreed to services and verbal consent obtained.   The patient verbalized understanding of instructions, educational materials, and care plan provided today and DECLINED offer to receive copy of patient instructions, educational materials, and care plan.   [facility]  [facility], [facility] Clinical Social Worker Direct Dial: [phone]  Fax: [phone] Website: [email or web address] 5:07 AM

## 2023-10-25 NOTE — Plan of Care (Signed)
  Problem: Education: Goal: Knowledge of General Education information will improve Description: Including pain rating scale, medication(s)/side effects and non-pharmacologic comfort measures Outcome: Progressing   Problem: Health Behavior/Discharge Planning: Goal: Ability to manage health-related needs will improve Outcome: Progressing   Problem: Clinical Measurements: Goal: Ability to maintain clinical measurements within normal limits will improve Outcome: Progressing Goal: Will remain free from infection Outcome: Progressing Goal: Cardiovascular complication will be avoided Outcome: Progressing   Problem: Activity: Goal: Risk for activity intolerance will decrease Outcome: Progressing   Problem: Skin Integrity: Goal: Risk for impaired skin integrity will decrease Outcome: Progressing

## 2023-10-26 ENCOUNTER — Encounter (HOSPITAL_COMMUNITY): Payer: Self-pay | Admitting: Vascular Surgery

## 2023-10-26 DIAGNOSIS — I70235 Atherosclerosis of native arteries of right leg with ulceration of other part of foot: Secondary | ICD-10-CM | POA: Diagnosis not present

## 2023-10-26 DIAGNOSIS — I70221 Atherosclerosis of native arteries of extremities with rest pain, right leg: Secondary | ICD-10-CM | POA: Diagnosis not present

## 2023-10-26 DIAGNOSIS — L03115 Cellulitis of right lower limb: Secondary | ICD-10-CM | POA: Diagnosis not present

## 2023-10-26 LAB — CBC
HCT: 41.6 % (ref 39.0–52.0)
Hemoglobin: 13 g/dL (ref 13.0–17.0)
MCH: 28.2 pg (ref 26.0–34.0)
MCHC: 31.3 g/dL (ref 30.0–36.0)
MCV: 90.2 fL (ref 80.0–100.0)
Platelets: 166 10*3/uL (ref 150–400)
RBC: 4.61 MIL/uL (ref 4.22–5.81)
RDW: 14.4 % (ref 11.5–15.5)
WBC: 6.7 10*3/uL (ref 4.0–10.5)
nRBC: 0 % (ref 0.0–0.2)

## 2023-10-26 LAB — COMPREHENSIVE METABOLIC PANEL
ALT: 7 U/L (ref 0–44)
AST: 14 U/L — ABNORMAL LOW (ref 15–41)
Albumin: 2.1 g/dL — ABNORMAL LOW (ref 3.5–5.0)
Alkaline Phosphatase: 39 U/L (ref 38–126)
Anion gap: 5 (ref 5–15)
BUN: 31 mg/dL — ABNORMAL HIGH (ref 8–23)
CO2: 22 mmol/L (ref 22–32)
Calcium: 6.7 mg/dL — ABNORMAL LOW (ref 8.9–10.3)
Chloride: 112 mmol/L — ABNORMAL HIGH (ref 98–111)
Creatinine, Ser: 1.07 mg/dL (ref 0.61–1.24)
GFR, Estimated: >60 mL/min (ref >60)
Glucose, Bld: 81 mg/dL (ref 70–99)
Potassium: 3.9 mmol/L (ref 3.5–5.1)
Sodium: 139 mmol/L (ref 135–145)
Total Bilirubin: 0.4 mg/dL (ref 0.0–1.2)
Total Protein: 4.5 g/dL — ABNORMAL LOW (ref 6.5–8.1)

## 2023-10-26 LAB — LIPID PANEL
Cholesterol: 80 mg/dL (ref 0–200)
HDL: 27 mg/dL — ABNORMAL LOW (ref >40)
LDL Cholesterol: 44 mg/dL (ref 0–99)
Total CHOL/HDL Ratio: 3 {ratio}
Triglycerides: 46 mg/dL (ref <150)
VLDL: 9 mg/dL (ref 0–40)

## 2023-10-26 LAB — MAGNESIUM: Magnesium: 1.7 mg/dL (ref 1.7–2.4)

## 2023-10-26 LAB — GLUCOSE, CAPILLARY
Glucose-Capillary: 105 mg/dL — ABNORMAL HIGH (ref 70–99)
Glucose-Capillary: 100 mg/dL — ABNORMAL HIGH (ref 70–99)
Glucose-Capillary: 139 mg/dL — ABNORMAL HIGH (ref 70–99)
Glucose-Capillary: 134 mg/dL — ABNORMAL HIGH (ref 70–99)
Glucose-Capillary: 107 mg/dL — ABNORMAL HIGH (ref 70–99)

## 2023-10-26 LAB — PHOSPHORUS: Phosphorus: 2.4 mg/dL — ABNORMAL LOW (ref 2.5–4.6)

## 2023-10-26 MED ORDER — ROSUVASTATIN CALCIUM 20 MG PO TABS: 20.0000 mg | ORAL_TABLET | Freq: Every day | ORAL | 11 refills | Status: DC

## 2023-10-26 MED ORDER — ROSUVASTATIN CALCIUM 20 MG PO TABS
20.0000 mg | ORAL_TABLET | Freq: Every day | ORAL | Status: DC
  Administered 2023-10-26 – 2023-11-03 (×8): 20 mg via ORAL
  Filled 2023-10-26 (×8): qty 1

## 2023-10-26 NOTE — Progress Notes (Signed)
PHARMACIST LIPID MONITORING   Clarence Maldonado is a 78 y.o. male admitted on 10/22/2023 with Critical limb threatening ischemia of right leg with tissue loss s/p aortogram.  Pharmacy has been consulted to optimize lipid-lowering therapy with the indication of secondary prevention for clinical ASCVD.  Recent Labs:  Lipid Panel (last 6 months):   Lab Results  Component Value Date   CHOL 80 10/26/2023   TRIG 46 10/26/2023   HDL 27 (L) 10/26/2023   CHOLHDL 3.0 10/26/2023   VLDL 9 10/26/2023   LDLCALC 44 10/26/2023    Hepatic function panel (last 6 months):   Lab Results  Component Value Date   AST 14 (L) 10/26/2023   ALT 7 10/26/2023   ALKPHOS 39 10/26/2023   BILITOT 0.4 10/26/2023    SCr (since admission):   Serum creatinine: 1.07 mg/dL 16/10/96 0454 Estimated creatinine clearance: 51.1 mL/min  Current therapy and lipid therapy tolerance Current lipid-lowering therapy: lovastatin Previous lipid-lowering therapies (if applicable): pravastatin when admitted Documented or reported allergies or intolerances to lipid-lowering therapies (if applicable): none  Assessment:   Patient found to have severe infrainguinal occlusive disease with a long segment CTO of the SFA and popliteal. LDL is at goal on lovastatin but patient may benefit from pleiotropic effects of a high intensity statin. Discussed with patient who would like to switch to a high intensity statin. Will start rosuvastatin 20mg .   Plan:    1.Statin intensity (high intensity recommended for all patients regardless of the LDL):  Add or increase statin to high intensity.  2.Add ezetimibe (if any one of the following):   Not indicated at this time.  3.Refer to lipid clinic:   No  4.Follow-up with:  Primary care provider - Octavia Heir, NP  5.Follow-up labs after discharge:  Changes in lipid therapy were made. Check a lipid panel in 8-12 weeks then annually.     Alphia Moh, PharmD, BCPS, BCCP Clinical  Pharmacist  Please check AMION for all [facility] Pharmacy phone numbers After 10:00 PM, call Main Pharmacy [phone]

## 2023-10-26 NOTE — Progress Notes (Addendum)
  Progress Note    10/26/2023 6:54 AM 1 Day Post-Op  Subjective:  sitting up in bed eating.    afebrile  Vitals:   10/25/23 2320 10/26/23 0309  BP: 116/72 126/76  Pulse: 87 77  Resp: 20 19  Temp: 97.8 F (36.6 C) 97.8 F (36.6 C)  SpO2: 91% 93%    Physical Exam: General:  no distress Lungs:  non labored Incisions:  left groin is soft without hematoma   CBC    Component Value Date/Time   WBC 6.7 10/26/2023 0328   RBC 4.61 10/26/2023 0328   HGB 13.0 10/26/2023 0328   HCT 41.6 10/26/2023 0328   HCT 32.2 (L) 05/30/2019 1552   PLT 166 10/26/2023 0328   MCV 90.2 10/26/2023 0328   MCH 28.2 10/26/2023 0328   MCHC 31.3 10/26/2023 0328   RDW 14.4 10/26/2023 0328   RDW 14.9 01/07/2015 0959   LYMPHSABS 1.3 10/25/2023 0318   LYMPHSABS 2.1 01/07/2015 0959   MONOABS 0.8 10/25/2023 0318   EOSABS 0.3 10/25/2023 0318   EOSABS 0.3 01/07/2015 0959   BASOSABS 0.1 10/25/2023 0318   BASOSABS 0.1 01/07/2015 0959    BMET    Component Value Date/Time   NA 139 10/26/2023 0328   NA 141 01/08/2016 0905   K 3.9 10/26/2023 0328   CL 112 (H) 10/26/2023 0328   CO2 22 10/26/2023 0328   GLUCOSE 81 10/26/2023 0328   BUN 31 (H) 10/26/2023 0328   BUN 26 01/08/2016 0905   CREATININE 1.07 10/26/2023 0328   CREATININE 1.93 (H) 09/23/2023 1528   CALCIUM 6.7 (L) 10/26/2023 0328   GFRNONAA >60 10/26/2023 0328   GFRNONAA 46 (L) 06/13/2020 1222   GFRAA 54 (L) 06/13/2020 1222    INR    Component Value Date/Time   INR 1.0 01/26/2020 1525     Intake/Output Summary (Last 24 hours) at 10/26/2023 0654 Last data filed at 10/26/2023 0538 Gross per 24 hour  Intake 240 ml  Output 1950 ml  Net -1710 ml      Assessment/Plan:  78 y.o. male is s/p:  Angiogram RLE via left CFA  1 Day Post-Op   -left groin is soft without hematoma -plan per Dr. Dwyane Dee, PA-C [facility] [phone] 10/26/2023 6:54 AM  I have seen and evaluated the patient. I  agree with the PA note as documented above.  Patient underwent lower extremity angiogram yesterday for tissue loss in the right lower extremity.  I reviewed the images and discussed with Dr. Hetty Blend.  He has long segment flush SFA occlusion with only an AT that reconstitutes.  This occludes at the ankle with no flow into the foot.  Not a suitable bypass target.  Discussed he would need primary amputation likely above knee amputation versus palliative wound care.  Certainly needs some time to think about his options which is understandable.  Cephus Shelling, MD [facility] [place] Office: [phone]

## 2023-10-26 NOTE — Progress Notes (Signed)
Mobility Specialist Progress Note:    10/26/23 1008  Mobility  Activity Ambulated with assistance in room;Transferred to/from [facility]  Level of Assistance Minimal assist, patient does 75% or more  Assistive Device Front wheel walker  Distance Ambulated (ft) 15 ft  Activity Response Tolerated well  Mobility Referral Yes  Mobility visit 1 Mobility  Mobility Specialist Start Time (ACUTE ONLY) 1000  Mobility Specialist Stop Time (ACUTE ONLY) 1008  Mobility Specialist Time Calculation (min) (ACUTE ONLY) 8 min   Pt received on [facility], RN performed peri care, pt eager to ambulate. Required MinA to stand and ambulate around bed to chair. Tolerated well, asx throughout. Sitting up in chair comfortably with all needs met, call bell in reach.   Feliciana Rossetti Mobility Specialist Please contact via Special educational needs teacher or  Rehab office at [phone]

## 2023-10-26 NOTE — Progress Notes (Signed)
PROGRESS NOTE    Clarence Maldonado  KKX:[id number] DOB: 10-14-1945 DOA: 10/22/2023 PCP: Octavia Heir, NP    Brief Narrative:   Clarence Maldonado is a 78 y.o. male with past medical history significant for HTN, HLD, DM2, CKD stage IIIb, COPD, BPH, tobacco use disorder who presented to [facility] ED on 10/21/2022 from home with concern about right lower leg infection.  Patient reports he had a scratch to his right great toe a few weeks ago that became an open wound with scab.  Following he started having skin breakdown to his right lateral ankle.  He saw his PCP January 9 and was prescribed doxycycline.  Despite outpatient antibiotic use, he did not see any improvement of his symptoms and sought further care in the ED.  He denies fever, no chills, no nausea/vomiting, no chest pain, no shortness of breath, no urinary symptoms.  In the ED, temperature 97.9 F, HR 84, RR 18, BP 141/69, SpO2 93% on room air.  WBC 8.2, hemoglobin 15.9, platelet count 191.  Sodium 139, potassium 4.1, chloride 100, CO2 30, glucose 146, BUN 42, creat 1.94.  AST 17, ALT 11, total bilirubin 0.5.  Lactic acid 1.5.  Right foot/ankle x-ray with no acute or destructive bony abnormalities to right foot/ankle, shallow soft tissue ulceration plantar aspect of the right hindfoot not well-visualized, stable soft tissue edema throughout the right foot greatest in the forefoot.  TRH was consulted for admission for further evaluation management of cellulitis right lower extremity with failed outpatient antibiotics.  Patient was initially admitted at [facility], started on IV antibiotics.  Given abnormal ABIs concerning for critical limb ischemia; vascular surgery was consulted and patient was transferred to [facility] for further evaluation, management and need of angiogram.  Assessment & Plan:   Right lower extremity critical limb ischemia Right lower extremity cellulitis with wound Patient presenting to ED  with progressive ulceration, redness to his right lower extremity despite outpatient antibiotic use with doxycycline.  Patient was afebrile without leukocytosis.  Right foot/ankle x-rays with no bony destruction, notable for soft tissue edema.  MR right foot/tibia/fibula with circumferential subcutaneous edema right lower extremity, no abscess, no osteomyelitis, no deep fascial fluid collection, no myositis.  ABI notable for severe right lower extremity arterial disease and moderate left lower extremity arterial disease.  Right lower extremity angiogram 1/20 with long segment SFA occlusion with only a AT that reconstitutes which occludes at the ankle with no flow into the foot; with no suitable bypass target. -- Vascular surgery following, appreciate assistance -- Ceftriaxone 2 g IV every 24 hours -- Metronidazole 5 mg IV every 12 hours -- Vascular surgery recommends likely above-knee amputation versus palliative wound care; patient's roommate Clarence Maldonado came to discuss with patient today  Essential hypertension -- Metoprolol succinate 25 mg p.o. daily -- Hold home lisinopril -- Hydralazine 10 mg PO q6h PRN SBP >170  Hyperlipidemia -- Pravastatin 20 mg p.o. daily (lovasatatin 20mg  PO daily at home)  Type 2 diabetes mellitus Hemoglobin A1c 5.11 September 2023, well-controlled. -- Hold home Farxiga and glipizide -- SSI for coverage -- CBGs qAC/HS  CKD stage IIIb -- Cr 1.94>>1.53>1.36>1.07 (baseline 1.8 - 1.9 past year) -- Holding home lisinopril -- Avoid nephrotoxins, renal dose all medications -- BMP daily  COPD, not in acute exacerbation -- Dulera 2 puff BID (substituted for home Advair) -- Incruse Ellipta 1 puff daily (substituted for home Spiriva) -- Albuterol neb q4h PRN wheezing/shortness of breath  BPH --  Tamsulosin 0.4 mg p.o. daily  Anxiety/depression -- Bupropion 150 mg p.o. daily  Tobacco use disorder -- Counseled on need for complete cessation/abstinence   DVT prophylaxis:  enoxaparin (LOVENOX) injection 40 mg Start: 10/24/23 2200    Code Status: Full Code Family Communication: No family present at bedside this morning, updated patient's roommate/friend via telephone this morning  Disposition Plan:  Level of care: Telemetry Medical Status is: Inpatient Remains inpatient appropriate because: IV antibiotics    Consultants:  Vascular surgery  Procedures:  Abdominal/lower extremity angiogram:   Antimicrobials:  Ceftriaxone 1/18>> Metronidazole 1/18>> Cefazolin 1/17 - 1/18   Subjective: Patient seen examined bedside, resting calmly.  Lying in bed.  Seen by vascular surgery this morning.  Angiogram yesterday shows occlusion of SFA with no suitable bypass targets with recommendation of AKA versus palliative wound care.  Patient slightly upset about this and would like [place] to discuss with his roommate, Clarence Maldonado; in which she was updated extensively over the telephone this morning..  Remains on IV antibiotics.  No other specific complaints, concerns or questions at this time.  Denies headache, no dizziness, no chest pain, no shortness of breath, no abdominal pain, no fever/chills, no nausea/vomiting/diarrhea, no focal weakness, no fatigue, no paresthesia.  No acute events overnight per nursing staff.  Objective: Vitals:   10/26/23 0812 10/26/23 0838 10/26/23 1017 10/26/23 1155  BP: 120/68  127/64   Pulse: 90     Resp: 19     Temp: 98.8 F (37.1 C)     TempSrc: Oral   Oral  SpO2:  92%    Weight:      Height:        Intake/Output Summary (Last 24 hours) at 10/26/2023 1203 Last data filed at 10/26/2023 4696 Gross per 24 hour  Intake 240 ml  Output 1750 ml  Net -1510 ml   Filed Weights   10/22/23 1435 10/22/23 1930  Weight: 74.8 kg 65 kg    Examination:  Physical Exam: GEN: NAD, alert and oriented x 3, chronically ill appearance, appears older than stated age HEENT: NCAT, PERRL, EOMI, sclera clear, dry mucous membranes PULM: CTAB w/o  wheezes/crackles, normal respiratory effort, on room air CV: RRR w/o M/G/R GI: abd soft, NTND, NABS, no R/G/M MSK: RLE with ulceration to great toe, ankle with erythema, edema, muscle strength intact bilaterally upper/lower extremities  NEURO: CN II-XII intact, no focal deficits, sensation to light touch intact PSYCH: normal mood/affect Integumentary: Right lower extremity with ulceration the great toe/ankle with erythema/edema as depicted below, otherwise no other concerning rashes/lesions/wounds noted on exposed skin surfaces.             Data Reviewed: I have personally reviewed following labs and imaging studies  CBC: Recent Labs  Lab 10/22/23 1525 10/23/23 0512 10/24/23 0443 10/25/23 0318 10/26/23 0328  WBC 8.2 7.3 8.0 7.9 6.7  NEUTROABS  --   --  6.0 5.4  --   HGB 15.9 15.5 15.4 15.5 13.0  HCT 51.6 50.6 51.2 48.6 41.6  MCV 93.1 92.2 93.6 90.0 90.2  PLT 191 177 196 186 166   Basic Metabolic Panel: Recent Labs  Lab 10/22/23 1525 10/23/23 0512 10/24/23 0443 10/25/23 0318 10/26/23 0328  NA 139 140 136 137 139  K 4.1 4.1 4.7 4.8 3.9  CL 100 103 102 101 112*  CO2 30 30 27 26 22   GLUCOSE 146* 87 69* 94 81  BUN 42* 38* 34* 29* 31*  CREATININE 1.94* 1.70* 1.53* 1.36* 1.07  CALCIUM 9.3 8.9 8.7* 9.0 6.7*  MG  --   --   --   --  1.7  PHOS  --   --   --   --  2.4*   GFR: Estimated Creatinine Clearance: 51.1 mL/min (by C-G formula based on SCr of 1.07 mg/dL). Liver Function Tests: Recent Labs  Lab 10/22/23 1525 10/26/23 0328  AST 17 14*  ALT 11 7  ALKPHOS 69 39  BILITOT 0.5 0.4  PROT 7.4 4.5*  ALBUMIN 3.6 2.1*   No results for input(s): "LIPASE", "AMYLASE" in the last 168 hours. No results for input(s): "AMMONIA" in the last 168 hours. Coagulation Profile: No results for input(s): "INR", "PROTIME" in the last 168 hours. Cardiac Enzymes: No results for input(s): "CKTOTAL", "CKMB", "CKMBINDEX", "TROPONINI" in the last 168 hours. BNP (last 3 results) No  results for input(s): "PROBNP" in the last 8760 hours. HbA1C: No results for input(s): "HGBA1C" in the last 72 hours. CBG: Recent Labs  Lab 10/25/23 2145 10/25/23 2202 10/26/23 0511 10/26/23 0600 10/26/23 1157  GLUCAP 111* 123* 105* 100* 139*   Lipid Profile: Recent Labs    10/26/23 0328  CHOL 80  HDL 27*  LDLCALC 44  TRIG 46  CHOLHDL 3.0   Thyroid Function Tests: No results for input(s): "TSH", "T4TOTAL", "FREET4", "T3FREE", "THYROIDAB" in the last 72 hours. Anemia Panel: No results for input(s): "VITAMINB12", "FOLATE", "FERRITIN", "TIBC", "IRON", "RETICCTPCT" in the last 72 hours. Sepsis Labs: Recent Labs  Lab 10/22/23 1525 10/22/23 2009  LATICACIDVEN 1.5 1.2    Recent Results (from the past 240 hours)  Culture, blood (Routine X 2) w Reflex to ID Panel     Status: None (Preliminary result)   Collection Time: 10/22/23  6:55 PM   Specimen: BLOOD  Result Value Ref Range Status   Specimen Description   Final    BLOOD BLOOD RIGHT ARM Performed at [facility], [address] [place], [place] [address]    Special Requests   Final    BOTTLES DRAWN AEROBIC AND ANAEROBIC Blood Culture adequate volume Performed at [facility], [address] [place], [place] [address]    Culture   Final    NO GROWTH 4 DAYS Performed at [facility], [address] [place], [place] [address]    Report Status PENDING  Incomplete  Culture, blood (Routine X 2) w Reflex to ID Panel     Status: None (Preliminary result)   Collection Time: 10/22/23  8:09 PM   Specimen: BLOOD  Result Value Ref Range Status   Specimen Description   Final    BLOOD BLOOD LEFT ARM Performed at [facility], [address], [place], [place] [address]    Special Requests   Final    BOTTLES DRAWN AEROBIC ONLY Blood Culture adequate volume Performed at [facility], [address], [place], [place] [address]    Culture    Final    NO GROWTH 4 DAYS Performed at [facility], [address], [place], [place] [address]    Report Status PENDING  Incomplete         Radiology Studies: PERIPHERAL VASCULAR CATHETERIZATION Result Date: 10/25/2023 Images from the original result were not included.   Patient name: Clarence Maldonado       MRN: [id number]        DOB: Mar 04, 1946          Sex: male  10/25/2023 Pre-operative Diagnosis:  Critical limb threatening ischemia of right leg with tissue loss Post-operative diagnosis:  Same Surgeon:  Daria Pastures, MD Procedure Performed:  Ultrasound-guided access of left common femoral artery Aortogram and right lower extremity angiogram Second order cannulation of right external iliac artery Mynx closure of left common femoral artery 37 minutes moderate sedation  Indications:  Mr. Andress is a 78 year old male with history of stage III CKD, diabetes, hypertension and hyperlipidemia who was admitted with right leg and great toe wounds with an ABI of 0.43.  Risk and benefits of angiogram with possible intervention were reviewed, patient expressed understanding and elected to proceed.  Findings: Widely patent aorta and bilateral renal arteries.  Approximately 50% stenosis of the right common iliac artery.  Bilateral external iliac arteries with moderate calcific disease although no apparent stenosis. Right common femoral and profunda patent.  There appears to be a short segment of patent SFA although shortly occludes and there is a long segment CTO involving the SFA and popliteal artery which do not reconstitute.  The AT reconstitutes via profunda collaterals and is the only runoff to the foot.  There is very late reconstitution of the PT.  There is very diminutive filling of the pedal vasculature.             Procedure:  The patient was identified in the holding area and taken to the cath lab  The patient was then placed supine on the table and prepped and draped in the usual sterile fashion.   A time out was called.  Ultrasound was used to evaluate the left common femoral artery.  It was patent .  A digital ultrasound image was acquired.  A micropuncture needle was used to access the left common femoral artery under ultrasound guidance.  An 018 wire was advanced without resistance and a micropuncture sheath was placed.  The 018 wire was removed and a benson wire was placed.  The micropuncture sheath was exchanged for a 5 french sheath.  An omniflush catheter was advanced over the wire to the level of L-1.  An abdominal angiogram was obtained.  Next, using the omniflush catheter and a glide advantage wire, the aortic bifurcation was crossed and the catheter was placed into theright external iliac artery and right runoff was obtained. This demonstrated the above findings.  The Omni Flush catheter was removed and a minx closure device was deployed in the left common femoral artery.  Contrast: 25cc Sedation: 37 minutes  Impression: Severe infrainguinal occlusive disease with a long segment CTO of the SFA and popliteal.  AT reconstitution although minimal filling of pedal vasculature.   Daria Pastures MD [facility] [place] Office: [phone]    MR FOOT RIGHT W WO CONTRAST Result Date: 10/24/2023 CLINICAL DATA:  Soft tissue infection suspected, foot, xray done EXAM: MRI OF THE RIGHT FOREFOOT WITHOUT AND WITH CONTRAST TECHNIQUE: Multiplanar, multisequence MR imaging of the right forefoot was performed before and after the administration of intravenous contrast. CONTRAST:  6mL GADAVIST GADOBUTROL 1 MMOL/ML IV SOLN COMPARISON:  X-ray 10/22/2023 FINDINGS: Bones/Joint/Cartilage There are multiple sites of increased T2 signal within the bone marrow of the distal forefoot, difficult to determine if these represent sites of true bone marrow edema or artifactually increased signal secondary to poor fat saturation at the edge of the field of view. Involved sites include the distal phalanx  of the second toe, third toe, and fourth toe as well as the fifth toe proximal phalanx and adjacent fifth metatarsal  head. Faintly increased T2 marrow signal is also present within the distal phalanx of the great toe. Subchondral bone marrow edema within the first metatarsal head is secondary to reactive changes from mild osteoarthritis. There is no evidence of erosion or confluent low T1 marrow signal changes at the above described locations. No fracture or dislocation. No joint effusions. Ligaments Intact Lisfranc ligament.  Intact collateral ligaments. Muscles and Tendons Diffuse edema-like signal of the foot musculature which may represent changes related to denervation and/or myositis. Mild fatty infiltration of the foot musculature. No tenosynovitis. Soft tissues Mild subcutaneous edema over the dorsum of the foot. No organized or rim enhancing fluid collections. IMPRESSION: 1. Multiple sites of increased T2 signal within the bone marrow of the distal forefoot. It is difficult to determine if these represent sites of true bone marrow edema or artifactually increased signal secondary to poor fat saturation at the edge of the field of view. Involved sites include the distal phalanx of the second toe, third toe, and fourth toe as well as the fifth toe proximal phalanx and adjacent fifth metatarsal head. Faintly increased T2 marrow signal is also present within the distal phalanx of the great toe. No evidence of erosion or confluent low T1 marrow signal changes at the above described locations to suggest acute osteomyelitis. Reactive osteitis not excluded. Correlate with physical exam. 2. Diffuse edema-like signal of the foot musculature which may represent changes related to denervation and/or myositis. 3. Mild subcutaneous edema over the dorsum of the foot. No organized or rim enhancing fluid collections. Electronically Signed   By: Duanne Guess D.O.   On: 10/24/2023 19:46   MR TIBIA FIBULA RIGHT W WO  CONTRAST Result Date: 10/24/2023 CLINICAL DATA:  Soft tissue infection suspected, lower leg, xray done EXAM: MRI OF LOWER RIGHT EXTREMITY WITHOUT AND WITH CONTRAST TECHNIQUE: Multiplanar, multisequence MR imaging of the right lower leg was performed both before and after administration of intravenous contrast. CONTRAST:  6mL GADAVIST GADOBUTROL 1 MMOL/ML IV SOLN COMPARISON:  X-ray right ankle 10/22/2023 FINDINGS: Bones/Joint/Cartilage Right tibia and fibula are intact. No fracture. No malalignment. Small bone infarctions within the distal tibial metaphysis. Mild degenerative changes of the tibiotalar joint, not well assessed at the edge of the field of view. Mild focal subchondral marrow edema at the talar dome. No significant tibiotalar joint effusion. No extra-articular sites of bone marrow edema. No erosion. No marrow replacing bone lesion. Ligaments Not well assessed at the edges of the field of view. No obvious abnormality. Muscles and Tendons Mild fatty infiltration of the lower leg musculature. Faint perifascial edema tracking along the tibiofibular syndesmosis within the mid to distal lower leg. No perifascial fluid collections. No intramuscular fluid collections. Imaged tendinous structures are intact. No tenosynovitis. Soft tissues Mild circumferential subcutaneous edema of the right lower leg. No organized or rim enhancing fluid collection. IMPRESSION: 1. Mild circumferential subcutaneous edema of the right lower leg, which may represent cellulitis in the appropriate clinical setting. No organized or rim enhancing fluid collection to suggest abscess. 2. Faint perifascial edema tracking along the tibiofibular syndesmosis within the mid to distal lower leg, which may be posttraumatic or reflect a mild nonspecific fasciitis. No deep fascial fluid collections or evidence of significant myositis. 3. No evidence of osteomyelitis. Electronically Signed   By: Duanne Guess D.O.   On: 10/24/2023 19:36         Scheduled Meds:  buPROPion  150 mg Oral Daily   enoxaparin (LOVENOX) injection  40 mg Subcutaneous  Q24H   feeding supplement  237 mL Oral BID BM   insulin aspart  0-9 Units Subcutaneous TID WC   metoprolol succinate  25 mg Oral Daily   mometasone-formoterol  2 puff Inhalation BID   multivitamin with minerals  1 tablet Oral Daily   nicotine  21 mg Transdermal Daily   nutrition supplement (JUVEN)  1 packet Oral BID BM   rosuvastatin  20 mg Oral Daily   sodium chloride flush  3 mL Intravenous Q12H   tamsulosin  0.4 mg Oral Daily   umeclidinium bromide  1 puff Inhalation Daily   Continuous Infusions:  sodium chloride 100 mL/hr at 10/25/23 0538   sodium chloride     cefTRIAXone (ROCEPHIN)  IV 2 g (10/25/23 1704)   And   metronidazole 500 mg (10/26/23 0512)     LOS: 3 days    Time spent: 52 minutes spent on chart review, discussion with nursing staff, consultants, updating family and interview/physical exam; more than 50% of that time was spent in counseling and/or coordination of care.    Alvira Philips [place], DO [facility] Available via Epic secure chat 7am-7pm After these hours, please refer to coverage provider listed on [email or web address] 10/26/2023, 12:03 PM

## 2023-10-27 ENCOUNTER — Other Ambulatory Visit: Payer: Self-pay

## 2023-10-27 DIAGNOSIS — E440 Moderate protein-calorie malnutrition: Secondary | ICD-10-CM | POA: Insufficient documentation

## 2023-10-27 DIAGNOSIS — I70238 Atherosclerosis of native arteries of right leg with ulceration of other part of lower right leg: Secondary | ICD-10-CM

## 2023-10-27 DIAGNOSIS — L03115 Cellulitis of right lower limb: Secondary | ICD-10-CM | POA: Diagnosis not present

## 2023-10-27 DIAGNOSIS — I70235 Atherosclerosis of native arteries of right leg with ulceration of other part of foot: Secondary | ICD-10-CM | POA: Diagnosis not present

## 2023-10-27 DIAGNOSIS — I70221 Atherosclerosis of native arteries of extremities with rest pain, right leg: Secondary | ICD-10-CM | POA: Diagnosis not present

## 2023-10-27 LAB — GLUCOSE, CAPILLARY
Glucose-Capillary: 130 mg/dL — ABNORMAL HIGH (ref 70–99)
Glucose-Capillary: 204 mg/dL — ABNORMAL HIGH (ref 70–99)
Glucose-Capillary: 149 mg/dL — ABNORMAL HIGH (ref 70–99)
Glucose-Capillary: 98 mg/dL (ref 70–99)

## 2023-10-27 LAB — CULTURE, BLOOD (ROUTINE X 2)
Culture: NO GROWTH
Culture: NO GROWTH
Special Requests: ADEQUATE
Special Requests: ADEQUATE

## 2023-10-27 LAB — SURGICAL PCR SCREEN
MRSA, PCR: NEGATIVE
Staphylococcus aureus: NEGATIVE

## 2023-10-27 NOTE — H&P (View-Only) (Signed)
Progress Note    10/27/2023 6:51 AM 2 Days Post-Op  Subjective:  says he guesses he has to have an amputation. Says he doesn't have much family and is worried where he will go   afebrile  Vitals:   10/27/23 0001 10/27/23 0351  BP: 125/70 (!) 162/66  Pulse: 88 92  Resp: 18 18  Temp: 97.7 F (36.5 C) 97.8 F (36.6 C)  SpO2: 94% 94%    Physical Exam: General:  no distress Lungs:  non labored   CBC    Component Value Date/Time   WBC 6.7 10/26/2023 0328   RBC 4.61 10/26/2023 0328   HGB 13.0 10/26/2023 0328   HCT 41.6 10/26/2023 0328   HCT 32.2 (L) 05/30/2019 1552   PLT 166 10/26/2023 0328   MCV 90.2 10/26/2023 0328   MCH 28.2 10/26/2023 0328   MCHC 31.3 10/26/2023 0328   RDW 14.4 10/26/2023 0328   RDW 14.9 01/07/2015 0959   LYMPHSABS 1.3 10/25/2023 0318   LYMPHSABS 2.1 01/07/2015 0959   MONOABS 0.8 10/25/2023 0318   EOSABS 0.3 10/25/2023 0318   EOSABS 0.3 01/07/2015 0959   BASOSABS 0.1 10/25/2023 0318   BASOSABS 0.1 01/07/2015 0959    BMET    Component Value Date/Time   NA 139 10/26/2023 0328   NA 141 01/08/2016 0905   K 3.9 10/26/2023 0328   CL 112 (H) 10/26/2023 0328   CO2 22 10/26/2023 0328   GLUCOSE 81 10/26/2023 0328   BUN 31 (H) 10/26/2023 0328   BUN 26 01/08/2016 0905   CREATININE 1.07 10/26/2023 0328   CREATININE 1.93 (H) 09/23/2023 1528   CALCIUM 6.7 (L) 10/26/2023 0328   GFRNONAA >60 10/26/2023 0328   GFRNONAA 46 (L) 06/13/2020 1222   GFRAA 54 (L) 06/13/2020 1222    INR    Component Value Date/Time   INR 1.0 01/26/2020 1525     Intake/Output Summary (Last 24 hours) at 10/27/2023 0651 Last data filed at 10/27/2023 0600 Gross per 24 hour  Intake 390 ml  Output 350 ml  Net 40 ml      Assessment/Plan:  78 y.o. male is s/p:  Angiogram RLE via left CFA   2 Days Post-Op   -pt with long segment flush SFA occlusion with only an AT that reconstitutes and occludes at the ankle with no flow into the foot and does not have a suitable  bypass target.  He would need primary amputation, most likely above knee.  Palliative wound care also reasonable.   -he seems to be willing to proceed with amputation but is worried about where he will go afterward and where he can go in a wheelchair.  Says he doesn't have much family.  Dr. Chestine Spore will talk further with pt.     Doreatha Massed, PA-C [facility] [phone] 10/27/2023 6:51 AM  I have seen and evaluated the patient. I agree with the PA note as documented above.  78 year old male that has undergone right lower extremity angiogram for tissue loss.  This showed a long segment flush SFA occlusion with only an AT that reconstitutes and then occludes at the ankle with no flow into the foot.  Really does not have a suitable bypass target and I agree with Dr. Hetty Blend.  I again discussed right above-knee potation versus palliative wound care today.  I also discussed this with his friend on the phone.  Patient has agreed to proceed with right above-knee amputation.  Will post for tomorrow.  Please keep  n.p.o. after midnight.    Cephus Shelling, MD [facility] [place] Office: [phone]

## 2023-10-27 NOTE — Progress Notes (Addendum)
PROGRESS NOTE    Clarence Maldonado  ZOX:[id number] DOB: [birth year] DOA: 10/22/2023 PCP: Clarence Heir, NP    Brief Narrative:   Clarence Maldonado is a 78 y.o. male with past medical history significant for HTN, HLD, DM2, CKD stage IIIb, COPD, BPH, tobacco use disorder who presented to [facility] ED on 10/21/2022 from home with concern about right lower leg infection.  Patient reports he had a scratch to his right great toe a few weeks ago that became an open wound with scab.  Following he started having skin breakdown to his right lateral ankle.  He saw his PCP January 9 and was prescribed doxycycline.  Despite outpatient antibiotic use, he did not see any improvement of his symptoms and sought further care in the ED.  He denies fever, no chills, no nausea/vomiting, no chest pain, no shortness of breath, no urinary symptoms.  In the ED, temperature 97.9 F, HR 84, RR 18, BP 141/69, SpO2 93% on room air.  WBC 8.2, hemoglobin 15.9, platelet count 191.  Sodium 139, potassium 4.1, chloride 100, CO2 30, glucose 146, BUN 42, creat 1.94.  AST 17, ALT 11, total bilirubin 0.5.  Lactic acid 1.5.  Right foot/ankle x-ray with no acute or destructive bony abnormalities to right foot/ankle, shallow soft tissue ulceration plantar aspect of the right hindfoot not well-visualized, stable soft tissue edema throughout the right foot greatest in the forefoot.  TRH was consulted for admission for further evaluation management of cellulitis right lower extremity with failed outpatient antibiotics.  Patient was initially admitted at [facility], started on IV antibiotics.  Given abnormal ABIs concerning for critical limb ischemia; vascular surgery was consulted and patient was transferred to [facility] for further evaluation, management and need of angiogram.  Assessment & Plan:   Right lower extremity critical limb ischemia Right lower extremity cellulitis with wound Patient presenting to ED  with progressive ulceration, redness to his right lower extremity despite outpatient antibiotic use with doxycycline.  Patient was afebrile without leukocytosis.  Right foot/ankle x-rays with no bony destruction, notable for soft tissue edema.  MR right foot/tibia/fibula with circumferential subcutaneous edema right lower extremity, no abscess, no osteomyelitis, no deep fascial fluid collection, no myositis.  ABI notable for severe right lower extremity arterial disease and moderate left lower extremity arterial disease.  Right lower extremity angiogram 1/20 with long segment SFA occlusion with only a AT that reconstitutes which occludes at the ankle with no flow into the foot; with no suitable bypass target. -- Vascular surgery following, appreciate assistance -- Ceftriaxone 2 g IV every 24 hours -- Metronidazole 5 mg IV every 12 hours -- Vascular surgery recommends likely above-knee amputation vs palliative wound care; patient agreeable for amputation; NPO after MN  Essential hypertension -- Metoprolol succinate 25 mg p.o. daily -- Hold home lisinopril -- Hydralazine 10 mg PO q6h PRN SBP >170  Hyperlipidemia -- Pravastatin 20 mg p.o. daily (lovasatatin 20mg  PO daily at home)  Type 2 diabetes mellitus Hemoglobin A1c 5.11 September 2023, well-controlled. -- Hold home Farxiga and glipizide -- SSI for coverage -- CBGs qAC/HS  CKD stage IIIb -- Cr 1.94>>1.53>1.36>1.07 (baseline 1.8 - 1.9 past year) -- Holding home lisinopril -- Avoid nephrotoxins, renal dose all medications -- BMP daily  COPD, not in acute exacerbation -- Dulera 2 puff BID (substituted for home Advair) -- Incruse Ellipta 1 puff daily (substituted for home Spiriva) -- Albuterol neb q4h PRN wheezing/shortness of breath  BPH -- Tamsulosin  0.4 mg p.o. daily  Anxiety/depression -- Bupropion 150 mg p.o. daily  Tobacco use disorder -- Counseled on need for complete cessation/abstinence   DVT prophylaxis: enoxaparin  (LOVENOX) injection 40 mg Start: 10/24/23 2200    Code Status: Full Code Family Communication: No family present at bedside this morning, updated patient's roommate/friend via telephone yesterday morning  Disposition Plan:  Level of care: Med-Surg Status is: Inpatient Remains inpatient appropriate because: IV antibiotics, anticipate above-knee amputation    Consultants:  Vascular surgery  Procedures:  Abdominal/lower extremity angiogram:   Antimicrobials:  Ceftriaxone 1/18>> Metronidazole 1/18>> Cefazolin 1/17 - 1/18   Subjective: Patient seen examined bedside, resting calmly.  Lying in bed.  Discussed with patient once again regarding recommendation for amputation, after discussion with his roommate he is more amenable to this path as he understands his leg will not heal due to poor blood flow. Remains on IV antibiotics.  No other specific complaints, concerns or questions at this time.  Denies headache, no dizziness, no chest pain, no shortness of breath, no abdominal pain, no fever/chills, no nausea/vomiting/diarrhea, no focal weakness, no fatigue, no paresthesia.  No acute events overnight per nursing staff.  Objective: Vitals:   10/27/23 0001 10/27/23 0351 10/27/23 0739 10/27/23 0817  BP: 125/70 (!) 162/66 (!) 157/78   Pulse: 88 92 76   Resp: 18 18 19    Temp: 97.7 F (36.5 C) 97.8 F (36.6 C) 97.8 F (36.6 C)   TempSrc: Oral  Oral   SpO2: 94% 94% 95% 95%  Weight:      Height:        Intake/Output Summary (Last 24 hours) at 10/27/2023 1121 Last data filed at 10/27/2023 8469 Gross per 24 hour  Intake 390 ml  Output 850 ml  Net -460 ml   Filed Weights   10/22/23 1435 10/22/23 1930  Weight: 74.8 kg 65 kg    Examination:  Physical Exam: GEN: NAD, alert and oriented x 3, chronically ill appearance, appears older than stated age HEENT: NCAT, PERRL, EOMI, sclera clear, dry mucous membranes PULM: CTAB w/o wheezes/crackles, normal respiratory effort, on room  air CV: RRR w/o M/G/R GI: abd soft, NTND, NABS, no R/G/M MSK: RLE with ulceration to great toe, ankle with erythema, edema, muscle strength intact bilaterally upper/lower extremities  NEURO: CN II-XII intact, no focal deficits, sensation to light touch intact PSYCH: normal mood/affect Integumentary: Right lower extremity with ulceration the great toe/ankle with erythema/edema as depicted below, otherwise no other concerning rashes/lesions/wounds noted on exposed skin surfaces.             Data Reviewed: I have personally reviewed following labs and imaging studies  CBC: Recent Labs  Lab 10/22/23 1525 10/23/23 0512 10/24/23 0443 10/25/23 0318 10/26/23 0328  WBC 8.2 7.3 8.0 7.9 6.7  NEUTROABS  --   --  6.0 5.4  --   HGB 15.9 15.5 15.4 15.5 13.0  HCT 51.6 50.6 51.2 48.6 41.6  MCV 93.1 92.2 93.6 90.0 90.2  PLT 191 177 196 186 166   Basic Metabolic Panel: Recent Labs  Lab 10/22/23 1525 10/23/23 0512 10/24/23 0443 10/25/23 0318 10/26/23 0328  NA 139 140 136 137 139  K 4.1 4.1 4.7 4.8 3.9  CL 100 103 102 101 112*  CO2 30 30 27 26 22   GLUCOSE 146* 87 69* 94 81  BUN 42* 38* 34* 29* 31*  CREATININE 1.94* 1.70* 1.53* 1.36* 1.07  CALCIUM 9.3 8.9 8.7* 9.0 6.7*  MG  --   --   --   --  1.7  PHOS  --   --   --   --  2.4*   GFR: Estimated Creatinine Clearance: 51.1 mL/min (by C-G formula based on SCr of 1.07 mg/dL). Liver Function Tests: Recent Labs  Lab 10/22/23 1525 10/26/23 0328  AST 17 14*  ALT 11 7  ALKPHOS 69 39  BILITOT 0.5 0.4  PROT 7.4 4.5*  ALBUMIN 3.6 2.1*   No results for input(s): "LIPASE", "AMYLASE" in the last 168 hours. No results for input(s): "AMMONIA" in the last 168 hours. Coagulation Profile: No results for input(s): "INR", "PROTIME" in the last 168 hours. Cardiac Enzymes: No results for input(s): "CKTOTAL", "CKMB", "CKMBINDEX", "TROPONINI" in the last 168 hours. BNP (last 3 results) No results for input(s): "PROBNP" in the last 8760  hours. HbA1C: No results for input(s): "HGBA1C" in the last 72 hours. CBG: Recent Labs  Lab 10/26/23 0600 10/26/23 1157 10/26/23 1736 10/26/23 2107 10/27/23 0613  GLUCAP 100* 139* 134* 107* 130*   Lipid Profile: Recent Labs    10/26/23 0328  CHOL 80  HDL 27*  LDLCALC 44  TRIG 46  CHOLHDL 3.0   Thyroid Function Tests: No results for input(s): "TSH", "T4TOTAL", "FREET4", "T3FREE", "THYROIDAB" in the last 72 hours. Anemia Panel: No results for input(s): "VITAMINB12", "FOLATE", "FERRITIN", "TIBC", "IRON", "RETICCTPCT" in the last 72 hours. Sepsis Labs: Recent Labs  Lab 10/22/23 1525 10/22/23 2009  LATICACIDVEN 1.5 1.2    Recent Results (from the past 240 hours)  Culture, blood (Routine X 2) w Reflex to ID Panel     Status: None   Collection Time: 10/22/23  6:55 PM   Specimen: BLOOD  Result Value Ref Range Status   Specimen Description   Final    BLOOD BLOOD RIGHT ARM Performed at [facility], [address], [place], [place] [address]    Special Requests   Final    BOTTLES DRAWN AEROBIC AND ANAEROBIC Blood Culture adequate volume Performed at [facility], [address], [place], [place] [address]    Culture   Final    NO GROWTH 5 DAYS Performed at [facility], [address], [place], [place] [address]    Report Status 10/27/2023 FINAL  Final  Culture, blood (Routine X 2) w Reflex to ID Panel     Status: None   Collection Time: 10/22/23  8:09 PM   Specimen: BLOOD  Result Value Ref Range Status   Specimen Description   Final    BLOOD BLOOD LEFT ARM Performed at [facility], [address] [place], [place] [address]    Special Requests   Final    BOTTLES DRAWN AEROBIC ONLY Blood Culture adequate volume Performed at [facility], [address] [place], [place] [address]    Culture   Final    NO GROWTH 5 DAYS Performed at [facility], [address],  [place], [place] [address]    Report Status 10/27/2023 FINAL  Final         Radiology Studies: PERIPHERAL VASCULAR CATHETERIZATION Result Date: 10/25/2023 Images from the original result were not included.   Patient name: Clarence Maldonado       MRN: [id number]        DOB: 10-Nov-1945          Sex: male  10/25/2023 Pre-operative Diagnosis: Critical limb threatening ischemia of right leg with tissue loss Post-operative diagnosis:  Same Surgeon:  Daria Pastures, MD Procedure Performed:  Ultrasound-guided access of left common femoral artery Aortogram and right lower extremity angiogram Second order cannulation of right external iliac artery Mynx closure of left common femoral artery 37 minutes moderate sedation  Indications:  Mr. Beyers is a 78 year old male with history of stage III CKD, diabetes, hypertension and hyperlipidemia who was admitted with right leg and great toe wounds with an ABI of 0.43.  Risk and benefits of angiogram with possible intervention were reviewed, patient expressed understanding and elected to proceed.  Findings: Widely patent aorta and bilateral renal arteries.  Approximately 50% stenosis of the right common iliac artery.  Bilateral external iliac arteries with moderate calcific disease although no apparent stenosis. Right common femoral and profunda patent.  There appears to be a short segment of patent SFA although shortly occludes and there is a long segment CTO involving the SFA and popliteal artery which do not reconstitute.  The AT reconstitutes via profunda collaterals and is the only runoff to the foot.  There is very late reconstitution of the PT.  There is very diminutive filling of the pedal vasculature.             Procedure:  The patient was identified in the holding area and taken to the cath lab  The patient was then placed supine on the table and prepped and draped in the usual sterile fashion.  A time out was called.  Ultrasound was used to evaluate the left common  femoral artery.  It was patent .  A digital ultrasound image was acquired.  A micropuncture needle was used to access the left common femoral artery under ultrasound guidance.  An 018 wire was advanced without resistance and a micropuncture sheath was placed.  The 018 wire was removed and a benson wire was placed.  The micropuncture sheath was exchanged for a 5 french sheath.  An omniflush catheter was advanced over the wire to the level of L-1.  An abdominal angiogram was obtained.  Next, using the omniflush catheter and a glide advantage wire, the aortic bifurcation was crossed and the catheter was placed into theright external iliac artery and right runoff was obtained. This demonstrated the above findings.  The Omni Flush catheter was removed and a minx closure device was deployed in the left common femoral artery.  Contrast: 25cc Sedation: 37 minutes  Impression: Severe infrainguinal occlusive disease with a long segment CTO of the SFA and popliteal.  AT reconstitution although minimal filling of pedal vasculature.   Daria Pastures MD [facility] [place] Office: [phone]         Scheduled Meds:  buPROPion  150 mg Oral Daily   enoxaparin (LOVENOX) injection  40 mg Subcutaneous Q24H   feeding supplement  237 mL Oral BID BM   insulin aspart  0-9 Units Subcutaneous TID WC   metoprolol succinate  25 mg Oral Daily   mometasone-formoterol  2 puff Inhalation BID   multivitamin with minerals  1 tablet Oral Daily   nicotine  21 mg Transdermal Daily   nutrition supplement (JUVEN)  1 packet Oral BID BM   rosuvastatin  20 mg Oral Daily   sodium chloride flush  3 mL Intravenous Q12H   tamsulosin  0.4 mg Oral Daily   umeclidinium bromide  1 puff Inhalation Daily   Continuous Infusions:  sodium chloride 100 mL/hr at 10/25/23 0538   cefTRIAXone (ROCEPHIN)  IV 2 g (10/26/23 1758)   And   metronidazole 500 mg (10/27/23 0538)  LOS: 4 days    Time spent: 52 minutes  spent on chart review, discussion with nursing staff, consultants, updating family and interview/physical exam; more than 50% of that time was spent in counseling and/or coordination of care.    Alvira Philips [place], DO [facility] Available via Epic secure chat 7am-7pm After these hours, please refer to coverage provider listed on [email or web address] 10/27/2023, 11:21 AM

## 2023-10-27 NOTE — Progress Notes (Addendum)
Progress Note    10/27/2023 6:51 AM 2 Days Post-Op  Subjective:  says he guesses he has to have an amputation. Says he doesn't have much family and is worried where he will go   afebrile  Vitals:   10/27/23 0001 10/27/23 0351  BP: 125/70 (!) 162/66  Pulse: 88 92  Resp: 18 18  Temp: 97.7 F (36.5 C) 97.8 F (36.6 C)  SpO2: 94% 94%    Physical Exam: General:  no distress Lungs:  non labored   CBC    Component Value Date/Time   WBC 6.7 10/26/2023 0328   RBC 4.61 10/26/2023 0328   HGB 13.0 10/26/2023 0328   HCT 41.6 10/26/2023 0328   HCT 32.2 (L) 05/30/2019 1552   PLT 166 10/26/2023 0328   MCV 90.2 10/26/2023 0328   MCH 28.2 10/26/2023 0328   MCHC 31.3 10/26/2023 0328   RDW 14.4 10/26/2023 0328   RDW 14.9 01/07/2015 0959   LYMPHSABS 1.3 10/25/2023 0318   LYMPHSABS 2.1 01/07/2015 0959   MONOABS 0.8 10/25/2023 0318   EOSABS 0.3 10/25/2023 0318   EOSABS 0.3 01/07/2015 0959   BASOSABS 0.1 10/25/2023 0318   BASOSABS 0.1 01/07/2015 0959    BMET    Component Value Date/Time   NA 139 10/26/2023 0328   NA 141 01/08/2016 0905   K 3.9 10/26/2023 0328   CL 112 (H) 10/26/2023 0328   CO2 22 10/26/2023 0328   GLUCOSE 81 10/26/2023 0328   BUN 31 (H) 10/26/2023 0328   BUN 26 01/08/2016 0905   CREATININE 1.07 10/26/2023 0328   CREATININE 1.93 (H) 09/23/2023 1528   CALCIUM 6.7 (L) 10/26/2023 0328   GFRNONAA >60 10/26/2023 0328   GFRNONAA 46 (L) 06/13/2020 1222   GFRAA 54 (L) 06/13/2020 1222    INR    Component Value Date/Time   INR 1.0 01/26/2020 1525     Intake/Output Summary (Last 24 hours) at 10/27/2023 0651 Last data filed at 10/27/2023 0600 Gross per 24 hour  Intake 390 ml  Output 350 ml  Net 40 ml      Assessment/Plan:  78 y.o. male is s/p:  Angiogram RLE via left CFA   2 Days Post-Op   -pt with long segment flush SFA occlusion with only an AT that reconstitutes and occludes at the ankle with no flow into the foot and does not have a suitable  bypass target.  He would need primary amputation, most likely above knee.  Palliative wound care also reasonable.   -he seems to be willing to proceed with amputation but is worried about where he will go afterward and where he can go in a wheelchair.  Says he doesn't have much family.  Dr. Chestine Spore will talk further with pt.     Doreatha Massed, PA-C [facility] [phone] 10/27/2023 6:51 AM  I have seen and evaluated the patient. I agree with the PA note as documented above.  78 year old male that has undergone right lower extremity angiogram for tissue loss.  This showed a long segment flush SFA occlusion with only an AT that reconstitutes and then occludes at the ankle with no flow into the foot.  Really does not have a suitable bypass target and I agree with Dr. Hetty Blend.  I again discussed right above-knee potation versus palliative wound care today.  I also discussed this with his friend on the phone.  Patient has agreed to proceed with right above-knee amputation.  Will post for tomorrow.  Please keep  n.p.o. after midnight.    Cephus Shelling, MD [facility] [place] Office: [phone]

## 2023-10-27 NOTE — Progress Notes (Signed)
Mobility Specialist Progress Note:    10/27/23 1314  Mobility  Activity Transferred from bed to chair (x3 STS)  Level of Assistance Moderate assist, patient does 50-74%  Assistive Device Front wheel walker  Distance Ambulated (ft) 4 ft  Activity Response Tolerated well  Mobility Referral Yes  Mobility visit 1 Mobility  Mobility Specialist Start Time (ACUTE ONLY) 1050  Mobility Specialist Stop Time (ACUTE ONLY) 1107  Mobility Specialist Time Calculation (min) (ACUTE ONLY) 17 min   Pt received in bed agreeable to mobility. Pt was able to get to EOB independently but required ModA to stand d/t pain in RLE. Practiced x3 STS was able to go down to MinA. Took a couple steps towards the chair. Left w/ call bell and personal belongings in reach. All needs met. Chair alarm on.  Thompson Grayer Mobility Specialist  Please contact vis Secure Chat or  Rehab Office [phone]

## 2023-10-28 ENCOUNTER — Other Ambulatory Visit: Payer: Self-pay

## 2023-10-28 ENCOUNTER — Inpatient Hospital Stay (HOSPITAL_COMMUNITY): Payer: Medicare HMO | Admitting: Anesthesiology

## 2023-10-28 ENCOUNTER — Encounter (HOSPITAL_COMMUNITY): Payer: Self-pay | Admitting: Internal Medicine

## 2023-10-28 ENCOUNTER — Encounter (HOSPITAL_COMMUNITY)
Admission: EM | Disposition: A | Discharge status: Skilled Nursing Facility | Payer: Self-pay | Source: Home / Self Care | Attending: Internal Medicine

## 2023-10-28 DIAGNOSIS — I2510 Atherosclerotic heart disease of native coronary artery without angina pectoris: Secondary | ICD-10-CM | POA: Diagnosis not present

## 2023-10-28 DIAGNOSIS — I70261 Atherosclerosis of native arteries of extremities with gangrene, right leg: Secondary | ICD-10-CM

## 2023-10-28 DIAGNOSIS — L03115 Cellulitis of right lower limb: Secondary | ICD-10-CM | POA: Diagnosis not present

## 2023-10-28 DIAGNOSIS — I70221 Atherosclerosis of native arteries of extremities with rest pain, right leg: Secondary | ICD-10-CM

## 2023-10-28 DIAGNOSIS — L02415 Cutaneous abscess of right lower limb: Secondary | ICD-10-CM | POA: Diagnosis not present

## 2023-10-28 DIAGNOSIS — I10 Essential (primary) hypertension: Secondary | ICD-10-CM

## 2023-10-28 DIAGNOSIS — Z87891 Personal history of nicotine dependence: Secondary | ICD-10-CM | POA: Diagnosis not present

## 2023-10-28 HISTORY — PX: AMPUTATION: SHX166

## 2023-10-28 LAB — GLUCOSE, CAPILLARY
Glucose-Capillary: 92 mg/dL (ref 70–99)
Glucose-Capillary: 101 mg/dL — ABNORMAL HIGH (ref 70–99)
Glucose-Capillary: 102 mg/dL — ABNORMAL HIGH (ref 70–99)
Glucose-Capillary: 163 mg/dL — ABNORMAL HIGH (ref 70–99)
Glucose-Capillary: 257 mg/dL — ABNORMAL HIGH (ref 70–99)

## 2023-10-28 LAB — BASIC METABOLIC PANEL
Anion gap: 9 (ref 5–15)
BUN: 34 mg/dL — ABNORMAL HIGH (ref 8–23)
CO2: 25 mmol/L (ref 22–32)
Calcium: 8.6 mg/dL — ABNORMAL LOW (ref 8.9–10.3)
Chloride: 97 mmol/L — ABNORMAL LOW (ref 98–111)
Creatinine, Ser: 1.47 mg/dL — ABNORMAL HIGH (ref 0.61–1.24)
GFR, Estimated: 49 mL/min — ABNORMAL LOW (ref >60)
Glucose, Bld: 105 mg/dL — ABNORMAL HIGH (ref 70–99)
Potassium: 4.5 mmol/L (ref 3.5–5.1)
Sodium: 139 mmol/L (ref 135–145)

## 2023-10-28 LAB — CBC
HCT: 45.5 % (ref 39.0–52.0)
Hemoglobin: 14.5 g/dL (ref 13.0–17.0)
MCH: 28.5 pg (ref 26.0–34.0)
MCHC: 31.9 g/dL (ref 30.0–36.0)
MCV: 89.6 fL (ref 80.0–100.0)
Platelets: 211 10*3/uL (ref 150–400)
RBC: 5.08 MIL/uL (ref 4.22–5.81)
RDW: 14.6 % (ref 11.5–15.5)
WBC: 8.5 10*3/uL (ref 4.0–10.5)
nRBC: 0 % (ref 0.0–0.2)

## 2023-10-28 SURGERY — AMPUTATION, ABOVE KNEE
Anesthesia: General | Site: Leg Upper | Laterality: Right

## 2023-10-28 MED ORDER — ACETAMINOPHEN 10 MG/ML IV SOLN
INTRAVENOUS | Status: DC | PRN
  Administered 2023-10-28: 1000 mg via INTRAVENOUS

## 2023-10-28 MED ORDER — 0.9 % SODIUM CHLORIDE (POUR BTL) OPTIME
TOPICAL | Status: DC | PRN
  Administered 2023-10-28: 1000 mL

## 2023-10-28 MED ORDER — PHENYLEPHRINE HCL-NACL 20-0.9 MG/250ML-% IV SOLN
INTRAVENOUS | Status: DC | PRN
  Administered 2023-10-28: 20 ug/min via INTRAVENOUS

## 2023-10-28 MED ORDER — PROPOFOL 10 MG/ML IV BOLUS
INTRAVENOUS | Status: DC | PRN
  Administered 2023-10-28: 40 mg via INTRAVENOUS
  Administered 2023-10-28: 30 mg via INTRAVENOUS

## 2023-10-28 MED ORDER — ACETAMINOPHEN 500 MG PO TABS
500.0000 mg | ORAL_TABLET | Freq: Four times a day (QID) | ORAL | Status: AC
  Administered 2023-10-28 – 2023-10-29 (×2): 500 mg via ORAL
  Filled 2023-10-28 (×2): qty 1

## 2023-10-28 MED ORDER — ONDANSETRON HCL 4 MG/2ML IJ SOLN
INTRAMUSCULAR | Status: DC | PRN
  Administered 2023-10-28: 4 mg via INTRAVENOUS

## 2023-10-28 MED ORDER — OXYCODONE HCL 5 MG/5ML PO SOLN: 5.0000 mg | Freq: Once | ORAL | Status: DC | PRN

## 2023-10-28 MED ORDER — MORPHINE SULFATE (PF) 2 MG/ML IV SOLN: 0.5000 mg | INTRAVENOUS | Status: DC | PRN

## 2023-10-28 MED ORDER — ACETAMINOPHEN 500 MG PO TABS: 1000.0000 mg | ORAL_TABLET | Freq: Once | ORAL | Status: DC | PRN

## 2023-10-28 MED ORDER — FENTANYL CITRATE (PF) 100 MCG/2ML IJ SOLN: 25.0000 ug | INTRAMUSCULAR | Status: DC | PRN

## 2023-10-28 MED ORDER — HYDROCODONE-ACETAMINOPHEN 5-325 MG PO TABS
1.0000 | ORAL_TABLET | ORAL | Status: DC | PRN
Start: 2023-10-28 — End: 2023-11-03
  Administered 2023-10-30 – 2023-10-31 (×2): 2 via ORAL
  Administered 2023-11-01: 1 via ORAL
  Administered 2023-11-01 – 2023-11-02 (×2): 2 via ORAL
  Administered 2023-11-03: 1 via ORAL
  Filled 2023-10-28: qty 2
  Filled 2023-10-28: qty 1
  Filled 2023-10-28 (×4): qty 2

## 2023-10-28 MED ORDER — CHLORHEXIDINE GLUCONATE 0.12 % MT SOLN
OROMUCOSAL | Status: AC
  Administered 2023-10-28: 15 mL via OROMUCOSAL
  Filled 2023-10-28: qty 15

## 2023-10-28 MED ORDER — PROPOFOL 10 MG/ML IV BOLUS
INTRAVENOUS | Status: AC
  Filled 2023-10-28: qty 20

## 2023-10-28 MED ORDER — ROCURONIUM BROMIDE 10 MG/ML (PF) SYRINGE
PREFILLED_SYRINGE | INTRAVENOUS | Status: AC
  Filled 2023-10-28: qty 10

## 2023-10-28 MED ORDER — HYDROCODONE-ACETAMINOPHEN 7.5-325 MG PO TABS
1.0000 | ORAL_TABLET | ORAL | Status: DC | PRN
  Administered 2023-10-28 – 2023-10-30 (×2): 2 via ORAL
  Filled 2023-10-28 (×2): qty 2

## 2023-10-28 MED ORDER — SUGAMMADEX SODIUM 200 MG/2ML IV SOLN
INTRAVENOUS | Status: DC | PRN
  Administered 2023-10-28: 200 mg via INTRAVENOUS

## 2023-10-28 MED ORDER — SODIUM CHLORIDE FLUSH 0.9 % IV SOLN
INTRAVENOUS | Status: DC | PRN
  Administered 2023-10-28: 100 mL

## 2023-10-28 MED ORDER — LIDOCAINE 2% (20 MG/ML) 5 ML SYRINGE
INTRAMUSCULAR | Status: DC | PRN
  Administered 2023-10-28: 40 mg via INTRAVENOUS

## 2023-10-28 MED ORDER — ACETAMINOPHEN 10 MG/ML IV SOLN
1000.0000 mg | Freq: Once | INTRAVENOUS | Status: DC | PRN
Start: 2023-10-28 — End: 2023-10-28

## 2023-10-28 MED ORDER — FENTANYL CITRATE (PF) 250 MCG/5ML IJ SOLN
INTRAMUSCULAR | Status: AC
  Filled 2023-10-28: qty 5

## 2023-10-28 MED ORDER — OXYCODONE HCL 5 MG PO TABS: 5.0000 mg | ORAL_TABLET | Freq: Once | ORAL | Status: DC | PRN

## 2023-10-28 MED ORDER — DEXAMETHASONE SODIUM PHOSPHATE 10 MG/ML IJ SOLN
INTRAMUSCULAR | Status: DC | PRN
  Administered 2023-10-28: 10 mg via INTRAVENOUS

## 2023-10-28 MED ORDER — LACTATED RINGERS IV SOLN
INTRAVENOUS | Status: DC
Start: 2023-10-28 — End: 2023-10-28

## 2023-10-28 MED ORDER — LORAZEPAM 1 MG PO TABS
0.5000 mg | ORAL_TABLET | Freq: Two times a day (BID) | ORAL | Status: DC | PRN
  Administered 2023-10-28 – 2023-11-02 (×2): 0.5 mg via ORAL
  Filled 2023-10-28 (×2): qty 1

## 2023-10-28 MED ORDER — PHENYLEPHRINE 80 MCG/ML (10ML) SYRINGE FOR IV PUSH (FOR BLOOD PRESSURE SUPPORT)
PREFILLED_SYRINGE | INTRAVENOUS | Status: DC | PRN
  Administered 2023-10-28: 80 ug via INTRAVENOUS
  Administered 2023-10-28: 160 ug via INTRAVENOUS
  Administered 2023-10-28: 80 ug via INTRAVENOUS

## 2023-10-28 MED ORDER — PHENYLEPHRINE 80 MCG/ML (10ML) SYRINGE FOR IV PUSH (FOR BLOOD PRESSURE SUPPORT)
PREFILLED_SYRINGE | INTRAVENOUS | Status: AC
  Filled 2023-10-28: qty 10

## 2023-10-28 MED ORDER — FENTANYL CITRATE (PF) 250 MCG/5ML IJ SOLN
INTRAMUSCULAR | Status: DC | PRN
  Administered 2023-10-28 (×2): 50 ug via INTRAVENOUS

## 2023-10-28 MED ORDER — ACETAMINOPHEN 160 MG/5ML PO SOLN: 1000.0000 mg | Freq: Once | ORAL | Status: DC | PRN

## 2023-10-28 MED ORDER — CHLORHEXIDINE GLUCONATE 0.12 % MT SOLN: 15.0000 mL | Freq: Once | OROMUCOSAL | Status: AC

## 2023-10-28 MED ORDER — ONDANSETRON HCL 4 MG/2ML IJ SOLN
INTRAMUSCULAR | Status: AC
  Filled 2023-10-28: qty 2

## 2023-10-28 MED ORDER — LIDOCAINE 2% (20 MG/ML) 5 ML SYRINGE
INTRAMUSCULAR | Status: AC
  Filled 2023-10-28: qty 5

## 2023-10-28 MED ORDER — INSULIN ASPART 100 UNIT/ML IJ SOLN: 0.0000 [IU] | INTRAMUSCULAR | Status: DC | PRN

## 2023-10-28 MED ORDER — ROCURONIUM BROMIDE 10 MG/ML (PF) SYRINGE
PREFILLED_SYRINGE | INTRAVENOUS | Status: DC | PRN
  Administered 2023-10-28: 60 mg via INTRAVENOUS

## 2023-10-28 MED ORDER — BUPIVACAINE HCL (PF) 0.5 % IJ SOLN
INTRAMUSCULAR | Status: AC
  Filled 2023-10-28: qty 30

## 2023-10-28 MED ORDER — DEXAMETHASONE SODIUM PHOSPHATE 10 MG/ML IJ SOLN
INTRAMUSCULAR | Status: AC
  Filled 2023-10-28: qty 1

## 2023-10-28 MED ORDER — BUPIVACAINE LIPOSOME 1.3 % IJ SUSP
INTRAMUSCULAR | Status: AC
  Filled 2023-10-28: qty 20

## 2023-10-28 MED ORDER — ORAL CARE MOUTH RINSE: 15.0000 mL | Freq: Once | OROMUCOSAL | Status: AC

## 2023-10-28 SURGICAL SUPPLY — 37 items
BAG COUNTER SPONGE SURGICOUNT (BAG) ×1 IMPLANT
BLADE SAW GIGLI 510 (BLADE) IMPLANT
BLADE SAW SGTL 73X25 THK (BLADE) ×1 IMPLANT
BNDG COHESIVE 6X5 TAN ST LF (GAUZE/BANDAGES/DRESSINGS) ×1 IMPLANT
BNDG ELASTIC 4X5.8 VLCR STR LF (GAUZE/BANDAGES/DRESSINGS) ×1 IMPLANT
BNDG ELASTIC 6X5.8 VLCR STR LF (GAUZE/BANDAGES/DRESSINGS) ×1 IMPLANT
BNDG GAUZE DERMACEA FLUFF 4 (GAUZE/BANDAGES/DRESSINGS) ×2 IMPLANT
CANISTER SUCT 3000ML PPV (MISCELLANEOUS) ×1 IMPLANT
CLIP TI MEDIUM 6 (CLIP) ×1 IMPLANT
COVER BACK TABLE 60X90IN (DRAPES) ×1 IMPLANT
COVER SURGICAL LIGHT HANDLE (MISCELLANEOUS) ×2 IMPLANT
DRAPE HALF SHEET 40X57 (DRAPES) ×1 IMPLANT
DRAPE SURG ORHT 6 SPLT 77X108 (DRAPES) ×2 IMPLANT
DRSG ADAPTIC 3X8 NADH LF (GAUZE/BANDAGES/DRESSINGS) ×1 IMPLANT
ELECT REM PT RETURN 9FT ADLT (ELECTROSURGICAL) ×1
ELECTRODE REM PT RTRN 9FT ADLT (ELECTROSURGICAL) ×1 IMPLANT
GAUZE SPONGE 4X4 12PLY STRL (GAUZE/BANDAGES/DRESSINGS) ×2 IMPLANT
GLOVE BIO SURGEON STRL SZ7.5 (GLOVE) ×1 IMPLANT
GLOVE BIOGEL PI IND STRL 8 (GLOVE) ×1 IMPLANT
GOWN STRL REUS W/ TWL XL LVL3 (GOWN DISPOSABLE) ×1 IMPLANT
KIT BASIN OR (CUSTOM PROCEDURE TRAY) ×1 IMPLANT
KIT TURNOVER KIT B (KITS) ×1 IMPLANT
NDL 18GX1X1/2 (RX/OR ONLY) (NEEDLE) IMPLANT
NDL HYPO 22X1.5 SAFETY MO (MISCELLANEOUS) IMPLANT
NEEDLE 18GX1X1/2 (RX/OR ONLY) (NEEDLE) ×1 IMPLANT
NEEDLE HYPO 22X1.5 SAFETY MO (MISCELLANEOUS) ×1 IMPLANT
NS IRRIG 1000ML POUR BTL (IV SOLUTION) ×1 IMPLANT
PACK GENERAL/GYN (CUSTOM PROCEDURE TRAY) ×1 IMPLANT
PAD ARMBOARD 7.5X6 YLW CONV (MISCELLANEOUS) ×2 IMPLANT
STAPLER VISISTAT 35W (STAPLE) ×1 IMPLANT
STOCKINETTE IMPERVIOUS LG (DRAPES) ×1 IMPLANT
SUT SILK 0 TIES 10X30 (SUTURE) ×1 IMPLANT
SUT SILK 2-0 18XBRD TIE 12 (SUTURE) ×1 IMPLANT
SUT VIC AB 2-0 CT1 18 (SUTURE) ×2 IMPLANT
TOWEL GREEN STERILE (TOWEL DISPOSABLE) ×2 IMPLANT
UNDERPAD 30X36 HEAVY ABSORB (UNDERPADS AND DIAPERS) ×1 IMPLANT
WATER STERILE IRR 1000ML POUR (IV SOLUTION) ×1 IMPLANT

## 2023-10-28 NOTE — Transfer of Care (Signed)
Immediate Anesthesia Transfer of Care Note  Patient: Clarence Maldonado  Procedure(s) Performed: AMPUTATION ABOVE KNEE (Right: Leg Upper)  Patient Location: PACU  Anesthesia Type:General  Level of Consciousness: drowsy, patient cooperative, and responds to stimulation  Airway & Oxygen Therapy: Patient Spontanous Breathing and Patient connected to nasal cannula oxygen  Post-op Assessment: Report given to RN and Post -op Vital signs reviewed and stable  Post vital signs: Reviewed and stable  Last Vitals:  Vitals Value Taken Time  BP 122/57 10/28/23 1134  Temp    Pulse 81 10/28/23 1137  Resp 20 10/28/23 1137  SpO2 96 % 10/28/23 1137  Vitals shown include unfiled device data.  Last Pain:  Vitals:   10/28/23 0853  TempSrc:   PainSc: 5       Patients Stated Pain Goal: 0 (10/23/23 2034)  Complications: No notable events documented.

## 2023-10-28 NOTE — Progress Notes (Signed)
   Inpatient Rehabilitation Admissions Coordinator   Rehab consult received. Patient postop today. Met at bedside but patient confused. I will follow up tomorrow for full assessment.  Ottie Glazier, RN, MSN Rehab Admissions Coordinator [phone] 10/28/2023 6:05 PM

## 2023-10-28 NOTE — Anesthesia Procedure Notes (Signed)
Procedure Name: Intubation Date/Time: 10/28/2023 10:14 AM  Performed by: Ammie Dalton, CRNAPre-anesthesia Checklist: Patient identified, Emergency Drugs available, Suction available and Patient being monitored Patient Re-evaluated:Patient Re-evaluated prior to induction Oxygen Delivery Method: Circle System Utilized Preoxygenation: Pre-oxygenation with 100% oxygen Induction Type: IV induction Ventilation: Mask ventilation without difficulty and Oral airway inserted - appropriate to patient size Laryngoscope Size: Mac and 4 Grade View: Grade I Tube type: Oral Number of attempts: 1 Airway Equipment and Method: Stylet and Oral airway Placement Confirmation: ETT inserted through vocal cords under direct vision, positive ETCO2 and breath sounds checked- equal and bilateral Secured at: 22 cm Tube secured with: Tape Dental Injury: Teeth and Oropharynx as per pre-operative assessment

## 2023-10-28 NOTE — Progress Notes (Signed)
  Progress Note   Patient: Clarence Maldonado HYQ:[id number] DOB: 1946-07-31 DOA: 10/22/2023     5 DOS: the patient was seen and examined on 10/28/2023   Brief hospital course: 77yo with h/o stage 3b CKD, DM, HTN, HLD, and BPH who presented with RLE cellulitis in the setting of failed outpatient therapy with doxycycline.  He was started on Cefazolin, ABIs ordered.   Assessment and Plan: R lower ext critical limb ischemia  - AKA planned today in the OR (10/28/2023) - IV ceftriaxone 2 g daily  - IV flagyl 500 mg q12  - Ensure enlive PO bid  - MVI PO daily  - Juven 1 pack PO bid   HTN  - Toprol XL 25 mg PO daily  - Hydralazine PRN   HLD  - Crestor 20 mg PO daily   DM2 - Novolog SS  CKD3b - Stable;monitor   COPD - Albuterol q4 hr PRN  - Dulera 2 puff bid  - Incruse ellipta 1 puff daily   BPH - Flomax 0.4 mg PO daily   Anxiety/depression - Wellbutrin XL 150 mg PO daily. - Ativan 0.5 mg PO bid PRN        Subjective: Pt currently in the OR for AKA as noted above with vascular surgery. Continue w/ IV antibx's.  Physical Exam: Vitals:   10/28/23 0330 10/28/23 0726 10/28/23 0805 10/28/23 0820  BP: 137/89 (!) 157/83 (!) 171/75 (!) 171/76  Pulse: 78 87 70 70  Resp: 17 18  17   Temp: 98 F (36.7 C) (!) 97.5 F (36.4 C) 98.2 F (36.8 C) 98.1 F (36.7 C)  TempSrc: Oral Oral Oral Oral  SpO2: 91% 95% 98% 96%  Weight:    70.3 kg  Height:    5\' 5"  (1.651 m)   Pt not examined as he was taken to the OR very early this morning for said AKA      Disposition: Status is: Inpatient Remains inpatient appropriate because: AKA and IV antibx  Planned Discharge Destination:  Dispo per pt's clinical progress     Time spent: 35 minutes  Author: Baron Hamper , MD 10/28/2023 10:30 AM  For on call review [email or web address].

## 2023-10-28 NOTE — Op Note (Signed)
    Patient name: Clarence Maldonado MRN: [id number] DOB: April 26, 1946 Sex: male  10/28/2023 Pre-operative Diagnosis: Right leg gangrene Post-operative diagnosis:  Same Surgeon:  [name] Assistants:  Adonis Housekeeper, PA Procedure:   Right above-knee amputation Anesthesia:  General Blood Loss:  50 cc Specimens:  right leg  Findings: Viable skin and soft tissue at the amputation level  Indications: This is a 78 year old gentleman with ulceration to his right foot.  He has undergone angiography.  He does not have options for revascularization.  He comes in today for above-knee amputation.  Procedure:  The patient was identified in the holding area and taken to [facility] OR ROOM 11  The patient was then placed supine on the table. general anesthesia was administered.  The patient was prepped and draped in the usual sterile fashion.  A time out was called and antibiotics were administered.  A PA was necessary to expedite the procedure and assist with technical details.  She help with exposure by providing suction and retraction.  She helped with the technical details of the procedure.  A fishmouth incision was made just proximal to the patella.  A 10 blade was used to divide the tissue down to the fascia which was opened with cautery.  We then divided the muscle and exposed the femur circumferentially.  A periosteal elevator was used to elevate the periosteum.  Next, a Gigli saw was used to transect the femur, beveling the anterior surface.  A rasp was used to smooth the bone surface.  We then continued to divide the remaining muscle with cautery.  The nerve was individually isolated and divided between clamps.  Similarly the artery and vein were divided between vascular clamps.  The leg was then removed as a specimen.  We then dissected out the artery and vein proximal to the cut edge of the femur and then ligated them with a 0 silk tie.  Similarly, the nerve was divided proximal to the cut edge of the femur and  ligated with a 0 silk tie.  Exparel was placed into the nerve as well as the subcutaneous tissue.  The wound was then copiously irrigated.  Hemostasis was achieved.  The fascia was reapproximated with interrupted 2-0 Vicryl and the skin was closed with staples.  Sterile dressings were applied.  There were no immediate complications   Disposition: To PACU stable.   Juleen China, M.D., [facility] [place] Office: [phone] Pager:  [phone]

## 2023-10-28 NOTE — Progress Notes (Signed)
  Progress Note    10/28/2023 10:00 AM * Day of Surgery *  Subjective: Nervous about surgery today   Vitals:   10/28/23 0805 10/28/23 0820  BP: (!) 171/75 (!) 171/76  Pulse: 70 70  Resp:  17  Temp: 98.2 F (36.8 C) 98.1 F (36.7 C)  SpO2: 98% 96%   Physical Exam: Lungs:  non labored Extremities:  R leg dry gangrene Neurologic: A&O  CBC    Component Value Date/Time   WBC 8.5 10/28/2023 0322   RBC 5.08 10/28/2023 0322   HGB 14.5 10/28/2023 0322   HCT 45.5 10/28/2023 0322   HCT 32.2 (L) 05/30/2019 1552   PLT 211 10/28/2023 0322   MCV 89.6 10/28/2023 0322   MCH 28.5 10/28/2023 0322   MCHC 31.9 10/28/2023 0322   RDW 14.6 10/28/2023 0322   RDW 14.9 01/07/2015 0959   LYMPHSABS 1.3 10/25/2023 0318   LYMPHSABS 2.1 01/07/2015 0959   MONOABS 0.8 10/25/2023 0318   EOSABS 0.3 10/25/2023 0318   EOSABS 0.3 01/07/2015 0959   BASOSABS 0.1 10/25/2023 0318   BASOSABS 0.1 01/07/2015 0959    BMET    Component Value Date/Time   NA 139 10/28/2023 0322   NA 141 01/08/2016 0905   K 4.5 10/28/2023 0322   CL 97 (L) 10/28/2023 0322   CO2 25 10/28/2023 0322   GLUCOSE 105 (H) 10/28/2023 0322   BUN 34 (H) 10/28/2023 0322   BUN 26 01/08/2016 0905   CREATININE 1.47 (H) 10/28/2023 0322   CREATININE 1.93 (H) 09/23/2023 1528   CALCIUM 8.6 (L) 10/28/2023 0322   GFRNONAA 49 (L) 10/28/2023 0322   GFRNONAA 46 (L) 06/13/2020 1222   GFRAA 54 (L) 06/13/2020 1222    INR    Component Value Date/Time   INR 1.0 01/26/2020 1525    No intake or output data in the 24 hours ending 10/28/23 1000   Assessment/Plan:  78 y.o. male with extensive tissue loss R leg  Plan is for right above-the-knee amputation today in the operating room Continue n.p.o. Consent on paper chart Patient is understandably nervous about his surgery today.  Ativan ordered as needed   Emilie Rutter, PA-C [facility] [phone] 10/28/2023 10:00 AM

## 2023-10-28 NOTE — Anesthesia Preprocedure Evaluation (Signed)
Anesthesia Evaluation  Patient identified by MRN, date of birth, ID band Patient awake    Reviewed: Allergy & Precautions, NPO status , Patient's Chart, lab work & pertinent test results  History of Anesthesia Complications Negative for: history of anesthetic complications  Airway Mallampati: III  TM Distance: >3 FB Neck ROM: Full    Dental  (+) Teeth Intact, Dental Advisory Given   Pulmonary neg shortness of breath, neg sleep apnea, COPD, neg recent URI, former smoker    + decreased breath sounds      Cardiovascular hypertension, Pt. on medications + CAD, + Past MI and +CHF   Rhythm:Regular   1. Left ventricular ejection fraction, by estimation, is 30 to 35%. The  left ventricle has moderately decreased function. The left ventricle  demonstrates global hypokinesis. There is mild left ventricular  hypertrophy. Left ventricular diastolic  parameters are indeterminate.   2. Right ventricular systolic function is normal. The right ventricular  size is normal.   3. The mitral valve is degenerative. Mild mitral valve regurgitation. No  evidence of mitral stenosis.   4. The aortic valve is tricuspid. Aortic valve regurgitation is mild.  Mild to moderate aortic valve sclerosis/calcification is present, without  any evidence of aortic stenosis.   5. The inferior vena cava is normal in size with greater than 50%  respiratory variability, suggesting right atrial pressure of 3 mmHg.     Neuro/Psych neg Seizures CVA  negative psych ROS   GI/Hepatic negative GI ROS, Neg liver ROS,,,  Endo/Other  diabetes  Lab Results      Component                Value               Date                      HGBA1C                   5.7 (H)             09/23/2023             Renal/GU CRFRenal diseaseLab Results      Component                Value               Date                      NA                       139                 10/28/2023                 K                        4.5                 10/28/2023                CO2                      25                  10/28/2023  GLUCOSE                  105 (H)             10/28/2023                BUN                      34 (H)              10/28/2023                CREATININE               1.47 (H)            10/28/2023                CALCIUM                  8.6 (L)             10/28/2023                EGFR                     35 (L)              09/23/2023                GFRNONAA                 49 (L)              10/28/2023                Musculoskeletal   Abdominal   Peds  Hematology negative hematology ROS (+) Lab Results      Component                Value               Date                      WBC                      8.5                 10/28/2023                HGB                      14.5                10/28/2023                HCT                      45.5                10/28/2023                MCV                      89.6                10/28/2023                PLT  211                 10/28/2023              Anesthesia Other Findings   Reproductive/Obstetrics                              Anesthesia Physical Anesthesia Plan  ASA: 3  Anesthesia Plan: General   Post-op Pain Management: Ofirmev IV (intra-op)*   Induction: Intravenous  PONV Risk Score and Plan: 2 and Ondansetron and Dexamethasone  Airway Management Planned: Oral ETT  Additional Equipment: None  Intra-op Plan:   Post-operative Plan: Extubation in OR  Informed Consent: I have reviewed the patients History and Physical, chart, labs and discussed the procedure including the risks, benefits and alternatives for the proposed anesthesia with the patient or authorized representative who has indicated his/her understanding and acceptance.     Dental advisory given  Plan Discussed with: CRNA  Anesthesia Plan  Comments:          Anesthesia Quick Evaluation

## 2023-10-28 NOTE — Interval H&P Note (Signed)
History and Physical Interval Note:  10/28/2023 9:04 AM  Clarence Maldonado  has presented today for surgery, with the diagnosis of Critical limb threatening ischemia of right leg with tissue loss.  The various methods of treatment have been discussed with the patient and family. After consideration of risks, benefits and other options for treatment, the patient has consented to  Procedure(s): AMPUTATION ABOVE KNEE (Right) as a surgical intervention.  The patient's history has been reviewed, patient examined, no change in status, stable for surgery.  I have reviewed the patient's chart and labs.  Questions were answered to the patient's satisfaction.     [name]

## 2023-10-28 NOTE — Discharge Instructions (Signed)
[facility] [place]  Discharge instructions  Lower Extremity Amputation  Please refer to the following instruction for your post-procedure care. Your surgeon or physician assistant will discuss any changes with you.  Activity  You are encouraged to walk as much as you can. You can slowly return to normal activities during the month after your surgery. Avoid strenuous activity and heavy lifting until your doctor tells you it's OK. Avoid activities such as vacuuming or swinging a golf club. Do not drive until your doctor give the OK and you are no longer taking prescription pain medications. It is also normal to have difficulty with sleep habits, eating and bowel movement after surgery. These will go away with time.  Bathing/Showering  Shower daily after you go home. Do not soak in a bathtub, hot tub, or swim until the incision heals completely.  Incision Care  Clean your incision with mild soap and water. Shower every day. Pat the area dry with a clean towel. You do not need a bandage unless otherwise instructed. Do not apply any ointments or creams to your incision. If you have open wounds you will be instructed how to care for them or a visiting nurse may be arranged for you. If you have staples or sutures along your incision they will be removed at your post-op appointment. You may have skin glue on your incision. Do not peel it off. It will come off on its own in about one week.  Diet  Resume your normal diet. There are no special food restrictions following this procedure. A low fat/ low cholesterol diet is recommended for all patients with vascular disease. In order to heal from your surgery, it is CRITICAL to get adequate nutrition. Your body requires vitamins, minerals, and protein. Vegetables are the best source of vitamins and minerals. Vegetables also provide the perfect balance of protein. Processed food has little nutritional value, so try to avoid  this.  Medications  Resume taking all your medications unless your doctor or physician assistant tells you not to. If your incision is causing pain, you may take over-the-counter pain relievers such as acetaminophen (Tylenol). If you were prescribed a stronger pain medication, please aware these medication can cause nausea and constipation. Prevent nausea by taking the medication with a snack or meal. Avoid constipation by drinking plenty of fluids and eating foods with high amount of fiber, such as fruits, vegetables, and grains. Take Colace 100 mg (an over-the-counter stool softener) twice a day as needed for constipation.  Do not take Tylenol if you are taking prescription pain medications.  Follow Up  Our office will schedule a follow up appointment 4 weeks following discharge.  Please call us immediately for any of the following conditions  Increase pain, redness, warmth, or drainage (pus) from your incision site(s) Fever of 101 degree or higher The swelling in your leg with the amputation suddenly worsens and becomes more painful than when you were in the hospital  Leg swelling is common after amputation surgery.  The swelling should improve over a few months following surgery. To improve the swelling, you may elevate your legs above the level of your heart while you are sitting or resting. Your surgeon or physician assistant may ask you to apply an ACE wrap or wear compression (TED) stockings to help to reduce swelling.  Reduce your risk of vascular disease  Stop smoking. If you would like help call [facility] at 1-800-QUIT-NOW (1-800-784-8669) or [place] at 336-586-4000.  Manage   your cholesterol Maintain a desired weight Control your diabetes weight Control your diabetes Keep your blood pressure down  If you have any questions, please call the office at [phone] 

## 2023-10-28 NOTE — Plan of Care (Signed)

## 2023-10-29 ENCOUNTER — Encounter (HOSPITAL_COMMUNITY): Payer: Self-pay | Admitting: Surgery

## 2023-10-29 DIAGNOSIS — I739 Peripheral vascular disease, unspecified: Secondary | ICD-10-CM

## 2023-10-29 DIAGNOSIS — Z89611 Acquired absence of right leg above knee: Secondary | ICD-10-CM | POA: Diagnosis not present

## 2023-10-29 DIAGNOSIS — L0390 Cellulitis, unspecified: Secondary | ICD-10-CM

## 2023-10-29 LAB — BASIC METABOLIC PANEL
Anion gap: 8 (ref 5–15)
BUN: 35 mg/dL — ABNORMAL HIGH (ref 8–23)
CO2: 28 mmol/L (ref 22–32)
Calcium: 8.8 mg/dL — ABNORMAL LOW (ref 8.9–10.3)
Chloride: 101 mmol/L (ref 98–111)
Creatinine, Ser: 1.72 mg/dL — ABNORMAL HIGH (ref 0.61–1.24)
GFR, Estimated: 41 mL/min — ABNORMAL LOW (ref >60)
Glucose, Bld: 215 mg/dL — ABNORMAL HIGH (ref 70–99)
Potassium: 5.3 mmol/L — ABNORMAL HIGH (ref 3.5–5.1)
Sodium: 137 mmol/L (ref 135–145)

## 2023-10-29 LAB — GLUCOSE, CAPILLARY
Glucose-Capillary: 140 mg/dL — ABNORMAL HIGH (ref 70–99)
Glucose-Capillary: 133 mg/dL — ABNORMAL HIGH (ref 70–99)
Glucose-Capillary: 134 mg/dL — ABNORMAL HIGH (ref 70–99)
Glucose-Capillary: 139 mg/dL — ABNORMAL HIGH (ref 70–99)

## 2023-10-29 LAB — CBC
HCT: 46 % (ref 39.0–52.0)
Hemoglobin: 14.1 g/dL (ref 13.0–17.0)
MCH: 28.3 pg (ref 26.0–34.0)
MCHC: 30.7 g/dL (ref 30.0–36.0)
MCV: 92.2 fL (ref 80.0–100.0)
Platelets: 233 10*3/uL (ref 150–400)
RBC: 4.99 MIL/uL (ref 4.22–5.81)
RDW: 14.7 % (ref 11.5–15.5)
WBC: 12.6 10*3/uL — ABNORMAL HIGH (ref 4.0–10.5)
nRBC: 0 % (ref 0.0–0.2)

## 2023-10-29 MED ORDER — SODIUM ZIRCONIUM CYCLOSILICATE 10 G PO PACK
10.0000 g | PACK | Freq: Two times a day (BID) | ORAL | Status: AC
  Administered 2023-10-29 (×2): 10 g via ORAL
  Filled 2023-10-29 (×2): qty 1

## 2023-10-29 NOTE — Evaluation (Signed)
Physical Therapy Evaluation Patient Details Name: Clarence Maldonado MRN: [id number] DOB: 01/08/1946 Today's Date: 10/29/2023  History of Present Illness  Pt is a 78 y.o. male admitted 1/17 with RLE cellulitis and limb threatening ischemia. He underwent R AKA 1/23. PMH: CKD 3B, tobacco use disorder, DM2, hyperlipidemia, HTN, depression, BPH   Clinical Impression  Pt admitted with above diagnosis. PTA pt lived alone, independent. Pt currently with functional limitations due to the deficits listed below (see PT Problem List). On eval, he required mod assist bed mobility, and min assist to maintain balance EOB. Unable to safely progress OOB due to cognition/confusion. Suspect he will progress well with mobility as post surgical confusion clears. Pt on 2.5L continuous O2 via [place]. Pt will benefit from acute skilled PT to increase their independence and safety with mobility to allow discharge. Upon d/c, pt would benefit from intensive inpatient rehab, >3 hours/day.         If plan is discharge home, recommend the following:     Can travel by private vehicle        Equipment Recommendations Wheelchair (measurements PT);Wheelchair cushion (measurements PT)  Recommendations for Other Services       Functional Status Assessment Patient has had a recent decline in their functional status and demonstrates the ability to make significant improvements in function in a reasonable and predictable amount of time.     Precautions / Restrictions Precautions Precautions: Fall;Other (comment) Precaution Comments: s/p R AKA Restrictions Weight Bearing Restrictions Per Provider Order: Yes RLE Weight Bearing Per Provider Order: Non weight bearing      Mobility  Bed Mobility Overal bed mobility: Needs Assistance Bed Mobility: Supine to Sit, Sit to Supine     Supine to sit: Mod assist, HOB elevated, Used rails Sit to supine: Min assist, HOB elevated, Used rails   General bed mobility comments:  increased time, cues for sequencing. Bed pad used to scoot to EOB.    Transfers                   General transfer comment: unable to safely progress beyond EOB due to safety/confusion    Ambulation/Gait                  Stairs            Wheelchair Mobility     Tilt Bed    Modified Rankin (Stroke Patients Only)       Balance Overall balance assessment: Needs assistance Sitting-balance support: Feet supported, Single extremity supported Sitting balance-Leahy Scale: Poor Sitting balance - Comments: min assist to maintain balance EOB                                     Pertinent Vitals/Pain Pain Assessment Pain Assessment: Faces Faces Pain Scale: Hurts even more Pain Location: R residual limb Pain Descriptors / Indicators: Sore, Grimacing, Guarding Pain Intervention(s): Limited activity within patient's tolerance, Monitored during session, Repositioned    Home Living Family/patient expects to be discharged to:: Private residence Living Arrangements: Alone                 Additional Comments: Pt is a poor historian. Unable to obtain reliable history. Per 2020 PT notes, pt lived in apartment with stairs to access. On eval, pt is reporting he lives in a mobile home.    Prior Function Prior Level of Function : Patient poor historian/Family  not available;Independent/Modified Independent;Driving             Mobility Comments: Pt reports independence without AD.       Extremity/Trunk Assessment   Upper Extremity Assessment Upper Extremity Assessment: Defer to OT evaluation    Lower Extremity Assessment Lower Extremity Assessment: RLE deficits/detail;Generalized weakness RLE Deficits / Details: s/p AKA, surgical dressing in place       Communication   Communication Communication: Difficulty communicating thoughts/reduced clarity of speech;Hearing impairment;Difficulty following commands/understanding Following  commands: Follows one step commands inconsistently;Follows one step commands with increased time Cueing Techniques: Verbal cues;Gestural cues;Tactile cues  Cognition Arousal: Alert Behavior During Therapy: Impulsive, WFL for tasks assessed/performed Overall Cognitive Status: No family/caregiver present to determine baseline cognitive functioning Area of Impairment: Orientation, Attention, Memory, Following commands, Safety/judgement, Awareness, Problem solving                 Orientation Level: Disoriented to, Time Current Attention Level: Sustained Memory: Decreased short-term memory Following Commands: Follows one step commands inconsistently, Follows one step commands with increased time Safety/Judgement: Decreased awareness of safety, Decreased awareness of deficits Awareness: Emergent Problem Solving: Difficulty sequencing, Slow processing, Requires verbal cues General Comments: Able to state that he 'had his leg cut off' yesterday. Unsure of baseline cognition. Suspect confusion exacerbated by anesthesia and pain meds. Tangential. Difficulty staying on task. Difficult to understand.        General Comments General comments (skin integrity, edema, etc.): VSS on 2.5L    Exercises     Assessment/Plan    PT Assessment Patient needs continued PT services  PT Problem List Decreased strength;Decreased balance;Decreased cognition;Decreased knowledge of precautions;Pain;Decreased mobility;Decreased knowledge of use of DME;Decreased activity tolerance;Decreased safety awareness       PT Treatment Interventions DME instruction;Functional mobility training;Balance training;Patient/family education;Wheelchair mobility training;Gait training;Therapeutic exercise;Therapeutic activities    PT Goals (Current goals can be found in the Care Plan section)  Acute Rehab PT Goals Patient Stated Goal: to walk, independence PT Goal Formulation: With patient Time For Goal Achievement:  11/12/23 Potential to Achieve Goals: Good    Frequency Min 1X/week     Co-evaluation               AM-PAC PT "6 Clicks" Mobility  Outcome Measure Help needed turning from your back to your side while in a flat bed without using bedrails?: A Little Help needed moving from lying on your back to sitting on the side of a flat bed without using bedrails?: A Lot Help needed moving to and from a bed to a chair (including a wheelchair)?: Total Help needed standing up from a chair using your arms (e.g., wheelchair or bedside chair)?: Total Help needed to walk in hospital room?: Total Help needed climbing 3-5 steps with a railing? : Total 6 Click Score: 9    End of Session Equipment Utilized During Treatment: Oxygen Activity Tolerance: Patient tolerated treatment well Patient left: in bed;with call bell/phone within reach;with bed alarm set Nurse Communication: Mobility status PT Visit Diagnosis: Other abnormalities of gait and mobility (R26.89);Muscle weakness (generalized) (M62.81);Pain Pain - Right/Left: Right Pain - part of body: Leg    Time: 0921-0939 PT Time Calculation (min) (ACUTE ONLY): 18 min   Charges:   PT Evaluation $PT Eval Moderate Complexity: 1 Mod   PT General Charges $$ ACUTE PT VISIT: 1 Visit         Ferd Glassing., PT  Office # [phone]   Ilda Foil 10/29/2023, 10:23 AM

## 2023-10-29 NOTE — TOC Transition Note (Signed)
Transition of Care [facility]) - Discharge Note   Patient Details  Name: Clarence Maldonado MRN: [id number] Date of Birth: 1946-06-06  [facility] CM/SW Contact:  Eduard Roux, LCSW Phone Number: 10/29/2023, 1:10 PM   Clinical Narrative:     CSW met with patient at bedside. CSW introduced self and explained role. Patient confirmed his address and reports he does not live home alone, he has a roommate. CSW was given permission to speak with his friend, [name], if needed. Patient appeared sleepy, therefore, CSW advised, will return at another time to assessment.  TOC will continue to follow and assist with discharge planning.  Antony Blackbird, MSW, LCSW Clinical Social Worker      Barriers to Discharge: Continued Medical Work up   Patient Goals and CMS Choice            Discharge Placement                       Discharge Plan and Services Additional resources added to the After Visit Summary for                                       Social Drivers of Health (SDOH) Interventions SDOH Screenings   Food Insecurity: No Food Insecurity (10/20/2023)  Housing: Unknown (10/20/2023)  Transportation Needs: No Transportation Needs (10/20/2023)  Utilities: Not At Risk (10/20/2023)  Depression (PHQ2-9): Low Risk  (10/14/2023)  Financial Resource Strain: Low Risk  (03/23/2019)  Physical Activity: Inactive (03/23/2019)  Social Connections: Socially Isolated (03/23/2019)  Stress: No Stress Concern Present (03/23/2019)  Tobacco Use: Medium Risk (10/28/2023)     Readmission Risk Interventions     No data to display

## 2023-10-29 NOTE — Progress Notes (Signed)
  Progress Note   Patient: Clarence Maldonado JYN:[id number] DOB: [birth year] DOA: 10/22/2023     6 DOS: the patient was seen and examined on 10/29/2023   Brief hospital course: 78yo with h/o stage 3b CKD, DM, HTN, HLD, and BPH who presented with RLE cellulitis in the setting of failed outpatient therapy with doxycycline.  He was started on Cefazolin, ABIs ordered.   Assessment and Plan: R lower ext critical limb ischemia  - s/p AKA (10/28/2023) - Vascular advised to stop IV antibx s/p AKA (source control has been obtained) - Ensure enlive PO bid  - MVI PO daily  - Juven 1 pack PO bid    HTN  - Toprol XL 25 mg PO daily  - Hydralazine PRN    HLD  - Crestor 20 mg PO daily    DM2 - Novolog SS   CKD3b - Stable;monitor    COPD - Albuterol q4 hr PRN  - Dulera 2 puff bid  - Incruse ellipta 1 puff daily    BPH - Flomax 0.4 mg PO daily    Anxiety/depression - Wellbutrin XL 150 mg PO daily. - Ativan 0.5 mg PO bid PRN    Subjective: Pt seen and examined at the bedside. He is awake and answering questions but appears hard of hearing. Vascular has advised to stop antibx today as they have achieved source control via the AKA.  Vascular to change pt's dressing tmr (Sat 10/30/2023).  Physical Exam: Vitals:   10/29/23 0400 10/29/23 0832 10/29/23 0847 10/29/23 1110  BP: 123/61 120/60  116/66  Pulse: 66 71 71 87  Resp:   16   Temp:  97.7 F (36.5 C)  98.1 F (36.7 C)  TempSrc:  Oral  Axillary  SpO2: 97% 95% 97% 90%  Weight:      Height:       Physical Exam HENT:     Head: Normocephalic.     Mouth/Throat:     Mouth: Mucous membranes are moist.  Cardiovascular:     Rate and Rhythm: Normal rate and regular rhythm.  Pulmonary:     Effort: Pulmonary effort is normal.  Abdominal:     Palpations: Abdomen is soft.  Musculoskeletal:     Cervical back: Neck supple.     Comments: R AKA   Skin:    General: Skin is warm.  Neurological:     Mental Status: He is alert.  Psychiatric:         Mood and Affect: Mood normal.       Disposition: Status is: Inpatient Remains inpatient appropriate because: s/p AKA requiring ongoing inpatient follow up from vascular surgery   Planned Discharge Destination:  Dispo per pt's clinical progress     Time spent: 35 minutes  Author: Baron Hamper , MD 10/29/2023 12:10 PM  For on call review [email or web address].

## 2023-10-29 NOTE — Progress Notes (Addendum)
  Progress Note    10/29/2023 8:08 AM 1 Day Post-Op  Subjective:  somnolent this morning but answering questions   Vitals:   10/28/23 2336 10/29/23 0357  BP: 130/70 123/61  Pulse: 70   Resp: 17 18  Temp: 98.3 F (36.8 C) 98.1 F (36.7 C)  SpO2: 97%    Physical Exam: Lungs:  non labored Incisions:  dressing left in place R AKA; no breakthrough bleeding Neurologic: A&O  CBC    Component Value Date/Time   WBC 12.6 (H) 10/29/2023 0319   RBC 4.99 10/29/2023 0319   HGB 14.1 10/29/2023 0319   HCT 46.0 10/29/2023 0319   HCT 32.2 (L) 05/30/2019 1552   PLT 233 10/29/2023 0319   MCV 92.2 10/29/2023 0319   MCH 28.3 10/29/2023 0319   MCHC 30.7 10/29/2023 0319   RDW 14.7 10/29/2023 0319   RDW 14.9 01/07/2015 0959   LYMPHSABS 1.3 10/25/2023 0318   LYMPHSABS 2.1 01/07/2015 0959   MONOABS 0.8 10/25/2023 0318   EOSABS 0.3 10/25/2023 0318   EOSABS 0.3 01/07/2015 0959   BASOSABS 0.1 10/25/2023 0318   BASOSABS 0.1 01/07/2015 0959    BMET    Component Value Date/Time   NA 137 10/29/2023 0319   NA 141 01/08/2016 0905   K 5.3 (H) 10/29/2023 0319   CL 101 10/29/2023 0319   CO2 28 10/29/2023 0319   GLUCOSE 215 (H) 10/29/2023 0319   BUN 35 (H) 10/29/2023 0319   BUN 26 01/08/2016 0905   CREATININE 1.72 (H) 10/29/2023 0319   CREATININE 1.93 (H) 09/23/2023 1528   CALCIUM 8.8 (L) 10/29/2023 0319   GFRNONAA 41 (L) 10/29/2023 0319   GFRNONAA 46 (L) 06/13/2020 1222   GFRAA 54 (L) 06/13/2020 1222    INR    Component Value Date/Time   INR 1.0 01/26/2020 1525     Intake/Output Summary (Last 24 hours) at 10/29/2023 9528 Last data filed at 10/29/2023 0502 Gross per 24 hour  Intake 1800.07 ml  Output 450 ml  Net 1350.07 ml     Assessment/Plan:  78 y.o. male is s/p R AKA 1 Day Post-Op   R AKA dressing left in place; vascular will change dressing tomorrow Somnolent this morning; please use narcotics as needed to avoid over sedation Blood cultures negative last week; source  control with AKA; ok to discontinue IV abx from our standpoint   Emilie Rutter, PA-C [facility] [phone] 10/29/2023 8:08 AM  I have seen and evaluated the patient. I agree with the PA note as documented above.  Postop day 1 status post right above-knee amputation for critical and ischemia with tissue loss.  Dressing is dry.  Pain is well-controlled.  Vascular will take down his dressing this weekend.  Cephus Shelling, MD [facility] [place] Office: [phone]

## 2023-10-29 NOTE — Progress Notes (Signed)
  Inpatient Rehabilitation Admissions Coordinator   Met with patient at bedside and spoke with his landlord, [name], by phone for rehab assessment. They are requesting SNF rehab at this time. He has no caregiver supports. I will alert acute team and TOC. We will sign off.. Please call me with any questions.   Ottie Glazier, RN, MSN Rehab Admissions Coordinator [phone]

## 2023-10-29 NOTE — Consult Note (Signed)
Physical Medicine and Rehabilitation Consult Reason for Consult: Impaired functional mobility after right above-knee amputation Referring Physician: Nelda Severe   HPI: Clarence Maldonado is a 78 y.o. male with a history of chronic kidney disease, diabetes who was admitted on 10/22/2023 with right lower extremity cellulitis and critical limb ischemia.  He was seen by vascular surgery and MRI of the right lower extremity was negative for osteomyelitis or myositis.  ABIs were notable for severe peripheral artery disease.  He had a right lower extremity angiogram on 10/25/2023 which demonstrated no flow into the foot.  Patient was placed on IV ceftriaxone and metronidazole.  Ultimately vascular surgery recommended above-knee amputation due to the extensive nature of his peripheral vascular disease.  On 10/27/1998 25 Dr. Myra Gianotti performed a right above-knee amputation.  Patient was up with physical therapy today.  However, he was limited due to cognition and confusion.  He accomplished only bed mobility this morning.  Patient lives home alone apparently in a mobile home.  Review of Systems  Unable to perform ROS: Mental acuity   Past Medical History:  Diagnosis Date   Abnormal CT of the chest 2009   nodule 9 x 6 mm RUL   Benign localized hyperplasia of prostate with urinary obstruction and other lower urinary tract symptoms (LUTS)(600.21)    Cardiomegaly    Cerebrovascular accident (stroke) (HCC) 2009   lacunar infarct in the left thalamus   Cholelithiasis    Chronic airway obstruction, not elsewhere classified    Chronic kidney disease, stage III (moderate) (HCC)    Closed femur fracture (HCC) 1960   Contact with or exposure to viral disease    suspected    Illiterate    Obesity, unspecified    Other and unspecified hyperlipidemia    Special screening examination for respiratory tuberculosis    Tobacco use disorder    Tuberculosis    Type II or unspecified type diabetes mellitus with renal  manifestations, uncontrolled(250.42)    Unspecified essential hypertension    Past Surgical History:  Procedure Laterality Date   ABDOMINAL AORTOGRAM W/LOWER EXTREMITY N/A 10/25/2023   Procedure: ABDOMINAL AORTOGRAM W/LOWER EXTREMITY;  Surgeon: Daria Pastures, MD;  Location: [facility];  Service: Cardiovascular;  Laterality: N/A;   AMPUTATION Right 10/28/2023   Procedure: AMPUTATION ABOVE KNEE;  Surgeon: Nada Libman, MD;  Location: [facility] OR;  Service: Vascular;  Laterality: Right;   FEMUR FRACTURE SURGERY Left 1960   Family History  Problem Relation Age of Onset   Heart disease Mother    Social History:  reports that he has quit smoking. His smoking use included cigarettes. He has a 30 pack-year smoking history. He has never used smokeless tobacco. He reports that he does not currently use alcohol. He reports that he does not use drugs. Allergies: No Known Allergies Medications Prior to Admission  Medication Sig Dispense Refill   albuterol (VENTOLIN HFA) 108 (90 Base) MCG/ACT inhaler INHALE 1 TO 2 PUFFS BY MOUTH EVERY 4 TO 6 HOURS AS NEEDED FOR BREATHING 17 each 11   buPROPion (WELLBUTRIN XL) 150 MG 24 hr tablet TAKE 1 TABLET(150 MG) BY MOUTH DAILY 90 tablet 1   dapagliflozin propanediol (FARXIGA) 5 MG TABS tablet Take 1 tablet (5 mg total) by mouth daily before breakfast. 90 tablet 3   ferrous sulfate (FEROSUL) 325 (65 FE) MG tablet Take 1 tablet (325 mg total) by mouth 2 (two) times daily with a meal. 60 tablet 5   fluticasone-salmeterol (  ADVAIR) 250-50 MCG/ACT AEPB Inhale 1 puff into the lungs in the morning and at bedtime. 60 each 5   furosemide (LASIX) 20 MG tablet Take 1 tablet (20 mg total) by mouth 3 (three) times a week. 30 tablet 3   glimepiride (AMARYL) 1 MG tablet Take 1 tablet (1 mg total) by mouth daily with breakfast. 90 tablet 1   lisinopril (ZESTRIL) 5 MG tablet Take 1 tablet (5 mg total) by mouth daily. 90 tablet 1   lovastatin (MEVACOR) 20 MG tablet Take 1  tablet (20 mg total) by mouth at bedtime. 90 tablet 1   metoprolol succinate (TOPROL-XL) 25 MG 24 hr tablet TAKE 1 TABLET(25 MG) BY MOUTH DAILY 90 tablet 1   tamsulosin (FLOMAX) 0.4 MG CAPS capsule Take 1 capsule (0.4 mg total) by mouth daily. Take with food 90 capsule 1   Tiotropium Bromide Monohydrate (SPIRIVA RESPIMAT) 1.25 MCG/ACT AERS Inhale 2 puffs into the lungs daily. 1 each 2    Home: Home Living Family/patient expects to be discharged to:: Private residence Living Arrangements: Alone Additional Comments: Pt is a poor historian. Unable to obtain reliable history. Per 2020 PT notes, pt lived in apartment with stairs to access. On eval, pt is reporting he lives in a mobile home.  Functional History: Prior Function Prior Level of Function : Patient poor historian/Family not available, Independent/Modified Independent, Driving Mobility Comments: Pt reports independence without AD. Functional Status:  Mobility: Bed Mobility Overal bed mobility: Needs Assistance Bed Mobility: Supine to Sit, Sit to Supine Supine to sit: Mod assist, HOB elevated, Used rails Sit to supine: Min assist, HOB elevated, Used rails General bed mobility comments: increased time, cues for sequencing. Bed pad used to scoot to EOB. Transfers General transfer comment: unable to safely progress beyond EOB due to safety/confusion      ADL:    Cognition: Cognition Overall Cognitive Status: No family/caregiver present to determine baseline cognitive functioning Orientation Level: Oriented X4 Cognition Arousal: Alert Behavior During Therapy: Impulsive, WFL for tasks assessed/performed Overall Cognitive Status: No family/caregiver present to determine baseline cognitive functioning Area of Impairment: Orientation, Attention, Memory, Following commands, Safety/judgement, Awareness, Problem solving Orientation Level: Disoriented to, Time Current Attention Level: Sustained Memory: Decreased short-term  memory Following Commands: Follows one step commands inconsistently, Follows one step commands with increased time Safety/Judgement: Decreased awareness of safety, Decreased awareness of deficits Awareness: Emergent Problem Solving: Difficulty sequencing, Slow processing, Requires verbal cues General Comments: Able to state that he 'had his leg cut off' yesterday. Unsure of baseline cognition. Suspect confusion exacerbated by anesthesia and pain meds. Tangential. Difficulty staying on task. Difficult to understand.  Blood pressure 116/66, pulse 87, temperature 98.1 F (36.7 C), temperature source Axillary, resp. rate 16, height 5\' 5"  (1.651 m), weight 70.3 kg, SpO2 90%. Physical Exam Constitutional:      General: He is not in acute distress. HENT:     Right Ear: External ear normal.     Left Ear: External ear normal.     Nose: Nose normal.     Comments: [place] Eyes:     Extraocular Movements: Extraocular movements intact.  Cardiovascular:     Rate and Rhythm: Normal rate.  Pulmonary:     Effort: Pulmonary effort is normal.  Abdominal:     Palpations: Abdomen is soft.  Musculoskeletal:        General: Tenderness (R AKA site) present. No swelling.     Cervical back: Normal range of motion.  Skin:    General:  Skin is warm.     Comments: Right AKA dressed  Neurological:     Mental Status: He is alert.     Comments: Pt was oriented to person, place, reason he was here, month/year, where he lives. Provided me some information about living situation but very distracted, really unable to focus for more than a few seconds. Kept asking for 2 pieces of chocolate cake. Speech slurred. Language intact. Moves both arms at least 3-4/5. RLE limited by pain. LLE 3/5. Decreased sensation distal LLE.  Psychiatric:     Comments: Distracted, anxious     Results for orders placed or performed during the hospital encounter of 10/22/23 (from the past 24 hours)  Glucose, capillary     Status: Abnormal    Collection Time: 10/28/23 11:36 AM  Result Value Ref Range   Glucose-Capillary 102 (H) 70 - 99 mg/dL   Comment 1 Notify RN   Glucose, capillary     Status: Abnormal   Collection Time: 10/28/23  4:23 PM  Result Value Ref Range   Glucose-Capillary 163 (H) 70 - 99 mg/dL  Glucose, capillary     Status: Abnormal   Collection Time: 10/28/23  9:10 PM  Result Value Ref Range   Glucose-Capillary 257 (H) 70 - 99 mg/dL  Basic metabolic panel     Status: Abnormal   Collection Time: 10/29/23  3:19 AM  Result Value Ref Range   Sodium 137 135 - 145 mmol/L   Potassium 5.3 (H) 3.5 - 5.1 mmol/L   Chloride 101 98 - 111 mmol/L   CO2 28 22 - 32 mmol/L   Glucose, Bld 215 (H) 70 - 99 mg/dL   BUN 35 (H) 8 - 23 mg/dL   Creatinine, Ser 4.09 (H) 0.61 - 1.24 mg/dL   Calcium 8.8 (L) 8.9 - 10.3 mg/dL   GFR, Estimated 41 (L) >60 mL/min   Anion gap 8 5 - 15  CBC     Status: Abnormal   Collection Time: 10/29/23  3:19 AM  Result Value Ref Range   WBC 12.6 (H) 4.0 - 10.5 K/uL   RBC 4.99 4.22 - 5.81 MIL/uL   Hemoglobin 14.1 13.0 - 17.0 g/dL   HCT 81.1 91.4 - 78.2 %   MCV 92.2 80.0 - 100.0 fL   MCH 28.3 26.0 - 34.0 pg   MCHC 30.7 30.0 - 36.0 g/dL   RDW 95.6 21.3 - 08.6 %   Platelets 233 150 - 400 K/uL   nRBC 0.0 0.0 - 0.2 %  Glucose, capillary     Status: Abnormal   Collection Time: 10/29/23  6:24 AM  Result Value Ref Range   Glucose-Capillary 140 (H) 70 - 99 mg/dL  Glucose, capillary     Status: Abnormal   Collection Time: 10/29/23 11:09 AM  Result Value Ref Range   Glucose-Capillary 133 (H) 70 - 99 mg/dL   No results found.  Assessment/Plan: Diagnosis: 78 year old male status post right above-knee amputation due to critical limb ischemia on 10/28/2023  Does the need for close, 24 hr/day medical supervision in concert with the patient's rehab needs make it unreasonable for this patient to be served in a less intensive setting? Yes Co-Morbidities requiring supervision/potential complications:   -Postoperative pain -Chronic kidney disease -COPD, currently on oxygen -Hypertension Due to bladder management, bowel management, safety, skin/wound care, disease management, medication administration, pain management, and patient education, does the patient require 24 hr/day rehab nursing? Yes Does the patient require coordinated care of a physician, rehab nurse,  therapy disciplines of PT, OT to address physical and functional deficits in the context of the above medical diagnosis(es)? Yes Addressing deficits in the following areas: balance, endurance, locomotion, strength, transferring, bowel/bladder control, bathing, dressing, feeding, grooming, toileting, cognition, and psychosocial support Can the patient actively participate in an intensive therapy program of at least 3 hrs of therapy per day at least 5 days per week? Yes and Potentially The potential for patient to make measurable gains while on inpatient rehab is good Anticipated functional outcomes upon discharge from inpatient rehab are modified independent and supervision  with PT, modified independent and supervision with OT, n/a with SLP. Estimated rehab length of stay to reach the above functional goals is: ?9-13 days Anticipated discharge destination:  his trailer? Overall Rehab/Functional Prognosis: excellent  POST ACUTE RECOMMENDATIONS: This patient's condition is appropriate for continued rehabilitative care in the following setting:  potentially CIR Patient has agreed to participate in recommended program. Potentially Note that insurance prior authorization may be required for reimbursement for recommended care.  Comment: He has great rehab potential. However, from what I could gather he just has a landlord (who he claimed was a friend) who can help him. He lives in a trailer without a ramp.  I think he'll need a little support when he leaves rehab. We need to confirm dispo. Rehab Admissions Coordinator to follow up.       I have personally performed a face to face diagnostic evaluation of this patient. Additionally, I have examined the patient's medical record including any pertinent labs and radiographic images. If the physician assistant has documented in this note, I have reviewed and edited or otherwise concur with the physician assistant's documentation.  Thanks,  Ranelle Oyster, MD 10/29/2023

## 2023-10-29 NOTE — Evaluation (Signed)
Occupational Therapy Evaluation Patient Details Name: Clarence Maldonado MRN: [id number] DOB: March 12, 1946 Today's Date: 10/29/2023   History of Present Illness Pt is a 78 y.o. male admitted 1/17 with RLE cellulitis and limb threatening ischemia. He underwent R AKA 1/23. 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 Cognition impaired however continued to improve as session progressed. Blinds opened and pt was able to sit EOB with S and engage in ADL tasks. Began desensitization activities and [name] expressed that he hopes he can "get a leg one day". Able to laterally scoot to [facility] with min A. Patient will benefit from continued inpatient follow up therapy, <3 hours/day to maximize functional level of independence. Acute OT to follow.  VSS on 2.5L Encourage measures to reduce risk of hospital induced delirium - lights on, blinds open during daytime hours, OOB as able      If plan is discharge home, recommend the following: A lot of help with walking and/or transfers;A lot of help with bathing/dressing/bathroom;Assistance with cooking/housework;Direct supervision/assist for medications management;Direct supervision/assist for financial management;Assist for transportation;Help with stairs or ramp for entrance    Functional Status Assessment  Patient has had a recent decline in their functional status and demonstrates the ability to make significant improvements in function in a reasonable and predictable amount of time.  Equipment Recommendations  Wheelchair (measurements OT);Wheelchair cushion (measurements OT);BSC/3in1    Recommendations for Other Services       Precautions / Restrictions Precautions Precautions: Fall;Other (comment) Precaution Comments: s/p R AKA Restrictions Weight Bearing Restrictions Per Provider Order:  Yes RLE Weight Bearing Per Provider Order: Non weight bearing      Mobility Bed Mobility Overal bed mobility: Needs Assistance Bed Mobility: Supine to Sit, Sit to Supine     Supine to sit: Mod assist, HOB elevated, Used rails Sit to supine: Min assist, HOB elevated, Used rails        Transfers                   General transfer comment: will most likely benefit from lateral scoot to chair; may be able to attempt stand with Stedy      Balance Overall balance assessment: Needs assistance Sitting-balance support: Feet supported, Single extremity supported Sitting balance-Leahy Scale: Fair Sitting balance - Comments: unable ot sit unsupported                                   ADL either performed or assessed with clinical judgement   ADL Overall ADL's : Needs assistance/impaired Eating/Feeding: Modified independent   Grooming: Set up;Supervision/safety;Bed level   Upper Body Bathing: Minimal assistance;Bed level   Lower Body Bathing: Moderate assistance;Bed level   Upper Body Dressing : Sitting;Minimal assistance   Lower Body Dressing: Maximal assistance;Bed level               Functional mobility during ADLs:  (bed mobility)       Vision   Additional Comments: unsure. will further assess     Perception         Praxis         Pertinent Vitals/Pain Pain Assessment Pain Assessment: Faces Faces Pain Scale: Hurts even more Pain Location: R residual limb Pain Descriptors / Indicators: Sore, Grimacing, Guarding Pain Intervention(s): Limited activity  within patient's tolerance     Extremity/Trunk Assessment Upper Extremity Assessment Upper Extremity Assessment: Right hand dominant;RUE deficits/detail;LUE deficits/detail RUE Deficits / Details: overall WFL however apparent shoulder dysfunciton LUE Deficits / Details: elbow/wrist/hand WFL; apparnt RTC insufficiency at baseline LUE Coordination: decreased gross motor   Lower  Extremity Assessment Lower Extremity Assessment: Defer to PT evaluation RLE Deficits / Details: s/p AKA, surgical dressing in place   Cervical / Trunk Assessment Cervical / Trunk Assessment: Normal   Communication Communication Communication: Hearing impairment Following commands: Follows one step commands consistently Cueing Techniques: Verbal cues;Tactile cues   Cognition Arousal: Alert Behavior During Therapy: WFL for tasks assessed/performed Overall Cognitive Status: Impaired/Different from baseline Area of Impairment: Orientation, Attention, Memory, Following commands, Safety/judgement, Awareness, Problem solving                 Orientation Level: Disoriented to, Time Current Attention Level: Sustained Memory: Decreased short-term memory Following Commands: Follows one step commands consistently Safety/Judgement: Decreased awareness of safety, Decreased awareness of deficits Awareness: Emergent Problem Solving: Slow processing, Requires verbal cues, Requires tactile cues General Comments: Cognition improved as session progressed     General Comments  VSS on 2.5 L; "I want to get a leg; I don't like looking at this"(referring to his residual limb). Once confusion has cleared would most likely benefit from peer support    Exercises Exercises: Other exercises Other Exercises Other Exercises: Began desensitization exercises; Pt sat EOB and began rubbing/tapping residual limb   Shoulder Instructions      Home Living Family/patient expects to be discharged to:: Skilled nursing facility Living Arrangements: Alone                               Additional Comments: Pt is a poor historian. Unable to obtain reliable history. Per 2020 PT notes, pt lived in apartment with stairs to access. On eval, pt is reporting he lives in a mobile home.      Prior Functioning/Environment Prior Level of Function : Patient poor historian/Family not  available;Independent/Modified Independent;Driving             Mobility Comments: Pt reports independence without AD. (Retired from working Agricultural consultant)          OT Problem List: Decreased strength;Decreased range of motion;Decreased activity tolerance;Impaired balance (sitting and/or standing);Decreased safety awareness;Decreased cognition;Decreased knowledge of use of DME or AE;Decreased knowledge of precautions;Cardiopulmonary status limiting activity;Pain;Impaired UE functional use      OT Treatment/Interventions: Self-care/ADL training;Therapeutic exercise;DME and/or AE instruction;Therapeutic activities;Cognitive remediation/compensation;Patient/family education;Balance training    OT Goals(Current goals can be found in the care plan section) Acute Rehab OT Goals Patient Stated Goal: to get rehab and get a leg OT Goal Formulation: With patient Time For Goal Achievement: 11/12/23 Potential to Achieve Goals: Good  OT Frequency: Min 1X/week    Co-evaluation              AM-PAC OT "6 Clicks" Daily Activity     Outcome Measure Help from another person eating meals?: None Help from another person taking care of personal grooming?: A Little Help from another person toileting, which includes using toliet, bedpan, or urinal?: A Lot Help from another person bathing (including washing, rinsing, drying)?: A Lot Help from another person to put on and taking off regular upper body clothing?: A Lot Help from another person to put on and taking off regular lower body clothing?: A Lot 6 Click Score: 15  End of Session Equipment Utilized During Treatment: Oxygen (2.5) Nurse Communication: Mobility status  Activity Tolerance: Patient tolerated treatment well Patient left: in bed;with call bell/phone within reach;with bed alarm set;Other (comment) (modified chair position)  OT Visit Diagnosis: Other abnormalities of gait and mobility (R26.89);Muscle weakness (generalized)  (M62.81);Pain Pain - Right/Left: Right Pain - part of body: Leg                Time: 1610-9604 OT Time Calculation (min): 35 min Charges:  OT General Charges $OT Visit: 1 Visit OT Evaluation $OT Eval Moderate Complexity: 1 Mod OT Treatments $Self Care/Home Management : 8-22 mins  Luisa Dago, OT/L   Acute OT Clinical Specialist Acute Rehabilitation Services Pager [phone] Office [phone]   [facility] 10/29/2023, 5:04 PM

## 2023-10-30 DIAGNOSIS — I70221 Atherosclerosis of native arteries of extremities with rest pain, right leg: Secondary | ICD-10-CM | POA: Diagnosis not present

## 2023-10-30 LAB — GLUCOSE, CAPILLARY
Glucose-Capillary: 103 mg/dL — ABNORMAL HIGH (ref 70–99)
Glucose-Capillary: 121 mg/dL — ABNORMAL HIGH (ref 70–99)
Glucose-Capillary: 173 mg/dL — ABNORMAL HIGH (ref 70–99)
Glucose-Capillary: 88 mg/dL (ref 70–99)

## 2023-10-30 NOTE — Progress Notes (Signed)
  Progress Note   Patient: Clarence Maldonado BJY:[id number] DOB: 15-Dec-1945 DOA: 10/22/2023     7 DOS: the patient was seen and examined on 10/30/2023   Brief hospital course: Patient is a 78 year old male with past medical history significant for chronic kidney disease stage IIIb, diabetes mellitus, hypertension, hyperlipidemia and BPH.  Patient was admitted with right lower extremity critical limb ischemia and RLE cellulitis in the setting of failed outpatient therapy with doxycycline.  Patient has undergone right AKA.  10/30/2023: Patient seen.  No new complaints.  Surgery input is appreciated.  Assessment and Plan: R lower ext critical limb ischemia  - s/p AKA (10/28/2023) - Vascular advised to stop IV antibx s/p AKA (source control has been obtained) - Ensure enlive PO bid  - MVI PO daily  - Juven 1 pack PO bid    HTN  - Toprol XL 25 mg PO daily  - Hydralazine PRN    HLD  - Crestor 20 mg PO daily    DM2 - Novolog SS   CKD3b - Stable;monitor    COPD - Albuterol q4 hr PRN  - Dulera 2 puff bid  - Incruse ellipta 1 puff daily    BPH - Flomax 0.4 mg PO daily    Anxiety/depression - Wellbutrin XL 150 mg PO daily. - Ativan 0.5 mg PO bid PRN    Subjective:  No new complaints. Patient is a poor historian.  Physical Exam: Vitals:   10/29/23 2338 10/30/23 0400 10/30/23 0807 10/30/23 0816  BP: 133/84 131/71  (!) 114/54  Pulse: 77 78  83  Resp: 16 20  19   Temp: 98.8 F (37.1 C) 98.3 F (36.8 C)  97.6 F (36.4 C)  TempSrc: Axillary   Axillary  SpO2: 93% 97% 94% 91%  Weight:      Height:       Physical Exam HENT:     Head: Normocephalic.     Mouth/Throat:     Mouth: Mucous membranes are moist.  Cardiovascular:     Rate and Rhythm: Normal rate and regular rhythm.  Pulmonary:     Effort: Pulmonary effort is normal.  Abdominal:     Palpations: Abdomen is soft.  Musculoskeletal:     Cervical back: Neck supple.     Comments: R AKA   Skin:    General: Skin is  warm.  Neurological:     Mental Status: He is alert.  Psychiatric:        Mood and Affect: Mood normal.       Disposition: Status is: Inpatient Remains inpatient appropriate because: s/p AKA requiring ongoing inpatient follow up from vascular surgery   Planned Discharge Destination:  Dispo per pt's clinical progress     Time spent: 35 minutes  Author: Barnetta Chapel, MD 10/30/2023 5:10 PM  For on call review [email or web address].

## 2023-10-30 NOTE — TOC Progression Note (Signed)
Transition of Care [facility]) - Progression Note    Patient Details  Name: Clarence Maldonado MRN: [id number] Date of Birth: 03/15/1946  Transition of [facility]) CM/SW Contact  Carley Hammed, LCSW Phone Number: 10/30/2023, 12:38 PM  Clinical Narrative:    CSW advised that pt has not been accepted to CIR and recommendation has been changed to SNF. Per medical team, pt is very confused today, CSW spoke with roommate [name]. CSW advised of change in disposition and all questions were answered. Roommate notes that he will assist pt to make this decision. Insurance coverage also discussed. CSW to complete workup and fax out for options. TOC will continue to follow.    Expected Discharge Plan: Skilled Nursing Facility Barriers to Discharge: Continued Medical Work up  Expected Discharge Plan and Services                                               Social Determinants of Health (SDOH) Interventions SDOH Screenings   Food Insecurity: No Food Insecurity (10/20/2023)  Housing: Unknown (10/20/2023)  Transportation Needs: No Transportation Needs (10/20/2023)  Utilities: Not At Risk (10/20/2023)  Depression (PHQ2-9): Low Risk  (10/14/2023)  Financial Resource Strain: Low Risk  (03/23/2019)  Physical Activity: Inactive (03/23/2019)  Social Connections: Socially Isolated (03/23/2019)  Stress: No Stress Concern Present (03/23/2019)  Tobacco Use: Medium Risk (10/28/2023)    Readmission Risk Interventions     No data to display

## 2023-10-30 NOTE — Plan of Care (Signed)
Pt is alert and oriented x 4 at hs. During overnight call light noted to be on. Upon nearing door heard pt hollering. RN noted confusion . Pt thought that his friends had dropped him off at 10 pm and left him. He thought he had not seen anyone. Pt informed that no one has been visiting during the overnight and that this is his original room. Pt reoriented of the care provided. Pt  had removed primafit. Pulled off pulse ox. Primafit reapplied. Dry pad placed. Pulse ox reapplied and vitals taken. Water provided.  Problem: Education: Goal: Knowledge of General Education information will improve Description: Including pain rating scale, medication(s)/side effects and non-pharmacologic comfort measures Outcome: Not Progressing   Problem: Health Behavior/Discharge Planning: Goal: Ability to manage health-related needs will improve Outcome: Not Progressing   Problem: Health Behavior/Discharge Planning: Goal: Ability to safely manage health-related needs after discharge will improve Outcome: Not Progressing   Problem: Clinical Measurements: Goal: Ability to maintain clinical measurements within normal limits will improve Outcome: Progressing Goal: Will remain free from infection Outcome: Progressing Goal: Diagnostic test results will improve Outcome: Progressing Goal: Respiratory complications will improve Outcome: Progressing Goal: Cardiovascular complication will be avoided Outcome: Progressing   Problem: Activity: Goal: Risk for activity intolerance will decrease Outcome: Progressing   Problem: Nutrition: Goal: Adequate nutrition will be maintained Outcome: Progressing   Problem: Coping: Goal: Level of anxiety will decrease Outcome: Progressing   Problem: Elimination: Goal: Will not experience complications related to bowel motility Outcome: Progressing Goal: Will not experience complications related to urinary retention Outcome: Progressing   Problem: Pain Managment: Goal:  General experience of comfort will improve and/or be controlled Outcome: Progressing   Problem: Safety: Goal: Ability to remain free from injury will improve Outcome: Progressing   Problem: Skin Integrity: Goal: Risk for impaired skin integrity will decrease Outcome: Progressing   Problem: Education: Goal: Ability to describe self-care measures that may prevent or decrease complications (Diabetes Survival Skills Education) will improve Outcome: Progressing   Problem: Coping: Goal: Ability to adjust to condition or change in health will improve Outcome: Progressing   Problem: Fluid Volume: Goal: Ability to maintain a balanced intake and output will improve Outcome: Progressing   Problem: Health Behavior/Discharge Planning: Goal: Ability to identify and utilize available resources and services will improve Outcome: Progressing Goal: Ability to manage health-related needs will improve Outcome: Progressing   Problem: Metabolic: Goal: Ability to maintain appropriate glucose levels will improve Outcome: Progressing   Problem: Nutritional: Goal: Maintenance of adequate nutrition will improve Outcome: Progressing Goal: Progress toward achieving an optimal weight will improve Outcome: Progressing   Problem: Skin Integrity: Goal: Risk for impaired skin integrity will decrease Outcome: Progressing   Problem: Tissue Perfusion: Goal: Adequacy of tissue perfusion will improve Outcome: Progressing   Problem: Education: Goal: Understanding of CV disease, CV risk reduction, and recovery process will improve Outcome: Progressing   Problem: Activity: Goal: Ability to return to baseline activity level will improve Outcome: Progressing   Problem: Cardiovascular: Goal: Ability to achieve and maintain adequate cardiovascular perfusion will improve Outcome: Progressing   Problem: Education: Goal: Knowledge of the prescribed therapeutic regimen will improve Outcome:  Progressing Goal: Ability to verbalize activity precautions or restrictions will improve Outcome: Progressing Goal: Understanding of discharge needs will improve Outcome: Progressing   Problem: Activity: Goal: Ability to perform//tolerate increased activity and mobilize with assistive devices will improve Outcome: Progressing   Problem: Clinical Measurements: Goal: Postoperative complications will be avoided or minimized Outcome: Progressing  Problem: Self-Care: Goal: Ability to meet self-care needs will improve Outcome: Progressing   Problem: Self-Concept: Goal: Ability to maintain and perform role responsibilities to the fullest extent possible will improve Outcome: Progressing   Problem: Pain Management: Goal: Pain level will decrease with appropriate interventions Outcome: Progressing   Problem: Skin Integrity: Goal: Demonstration of wound healing without infection will improve Outcome: Progressing

## 2023-10-30 NOTE — Progress Notes (Signed)
  Progress Note    10/30/2023 9:02 AM 2 Days Post-Op  Subjective:  no complaints. Pain well controlled. Sitting up eating breakfast this morning.   Vitals:   10/30/23 0807 10/30/23 0816  BP:  (!) 114/54  Pulse:  83  Resp:  19  Temp:  97.6 F (36.4 C)  SpO2: 94% 91%   Physical Exam: Lungs:  non labored Incisions:  R AKA healing well. Edges well approximated. Minimal serosanguinous drainage some slight ecchymosis on posterior edge Neurologic: A&O  CBC    Component Value Date/Time   WBC 12.6 (H) 10/29/2023 0319   RBC 4.99 10/29/2023 0319   HGB 14.1 10/29/2023 0319   HCT 46.0 10/29/2023 0319   HCT 32.2 (L) 05/30/2019 1552   PLT 233 10/29/2023 0319   MCV 92.2 10/29/2023 0319   MCH 28.3 10/29/2023 0319   MCHC 30.7 10/29/2023 0319   RDW 14.7 10/29/2023 0319   RDW 14.9 01/07/2015 0959   LYMPHSABS 1.3 10/25/2023 0318   LYMPHSABS 2.1 01/07/2015 0959   MONOABS 0.8 10/25/2023 0318   EOSABS 0.3 10/25/2023 0318   EOSABS 0.3 01/07/2015 0959   BASOSABS 0.1 10/25/2023 0318   BASOSABS 0.1 01/07/2015 0959    BMET    Component Value Date/Time   NA 137 10/29/2023 0319   NA 141 01/08/2016 0905   K 5.3 (H) 10/29/2023 0319   CL 101 10/29/2023 0319   CO2 28 10/29/2023 0319   GLUCOSE 215 (H) 10/29/2023 0319   BUN 35 (H) 10/29/2023 0319   BUN 26 01/08/2016 0905   CREATININE 1.72 (H) 10/29/2023 0319   CREATININE 1.93 (H) 09/23/2023 1528   CALCIUM 8.8 (L) 10/29/2023 0319   GFRNONAA 41 (L) 10/29/2023 0319   GFRNONAA 46 (L) 06/13/2020 1222   GFRAA 54 (L) 06/13/2020 1222    INR    Component Value Date/Time   INR 1.0 01/26/2020 1525     Intake/Output Summary (Last 24 hours) at 10/30/2023 0902 Last data filed at 10/30/2023 0817 Gross per 24 hour  Intake 833 ml  Output 1000 ml  Net -167 ml     Assessment/Plan:  78 y.o. male is s/p R AKA 2 Days Post-Op   Dressing taken down today. Amp site healing well.  Please redress with Kerlix and ACE wrap. Ok to start DC planning  from surgical perspective.    Daria Pastures MD [facility] Phone Number: [phone] 10/30/2023 9:03 AM

## 2023-10-30 NOTE — NC FL2 (Cosign Needed)
[place] MEDICAID FL2 LEVEL OF CARE FORM     IDENTIFICATION  Patient Name: Clarence Maldonado Birthdate: [birth year] Sex: male Admission Date (Current Location): 10/22/2023  [facility] and [place] Number:  Producer, television/film/video and Address:  [facility], [address] [place], [place] [address]      Provider Number: [id number]  Attending Physician Name and Address:  Barnetta Chapel, MD  Relative Name and Phone Number:       Current Level of Care: Hospital Recommended Level of Care: Skilled Nursing Facility Prior Approval Number:    Date Approved/Denied:   PASRR Number: [id number]  Discharge Plan: SNF    Current Diagnoses: Patient Active Problem List   Diagnosis Date Noted   Malnutrition of moderate degree 10/27/2023   Cellulitis and abscess of right lower extremity 10/23/2023   Cellulitis 10/22/2023   Aortic atherosclerosis (HCC) 02/17/2023   Dyspnea on exertion 09/16/2022   History of TB (tuberculosis) 10/17/2020   Nonischemic cardiomyopathy (HCC) 03/13/2020   Contact with or exposure to viral disease 02/01/2020   Special screening examination for respiratory tuberculosis 02/01/2020   Chest pain 01/26/2020   Acute lower UTI 07/23/2019   Fatigue    Lung infiltrate    Pulmonary tuberculosis with cavitation 06/06/2019   Moderate protein-calorie malnutrition (HCC) 06/06/2019   Hypokalemia 05/15/2019   Anemia 05/15/2019   Hypermetropia of both eyes 03/21/2018   Weight loss 03/21/2018   Hyperlipidemia associated with type 2 diabetes mellitus (HCC) 03/21/2018   Insomnia 02/03/2017   Cognitive change 01/29/2016   Illiterate 01/03/2014   Controlled type 2 diabetes mellitus with stage 3 chronic kidney disease, without long-term current use of insulin (HCC) 01/05/2013   Stranguria 01/05/2013   Essential hypertension 01/04/2013   CKD (chronic kidney disease) stage 3, GFR 30-59 ml/min (HCC) 01/04/2013   Hyperlipidemia 01/04/2013   Tobacco use  disorder 01/04/2013   BPH without obstruction/lower urinary tract symptoms 01/04/2013    Orientation RESPIRATION BLADDER Height & Weight     Self  O2 ([place] 2.5L) Incontinent Weight: 155 lb (70.3 kg) Height:  5\' 5"  (165.1 cm)  BEHAVIORAL SYMPTOMS/MOOD NEUROLOGICAL BOWEL NUTRITION STATUS      Continent Diet (See DC summary)  AMBULATORY STATUS COMMUNICATION OF NEEDS Skin   Extensive Assist Verbally Surgical wounds (L leg inc. R ankle wound)                       Personal Care Assistance Level of Assistance  Bathing, Feeding, Dressing Bathing Assistance: Maximum assistance Feeding assistance: Limited assistance Dressing Assistance: Maximum assistance     Functional Limitations Info  Sight, Hearing, Speech Sight Info: Adequate Hearing Info: Impaired Speech Info: Adequate    SPECIAL CARE FACTORS FREQUENCY  PT (By licensed PT), OT (By licensed OT)     PT Frequency: 5x week OT Frequency: 5x week            Contractures Contractures Info: Not present    Additional Factors Info  Code Status, Allergies, Psychotropic, Insulin Sliding Scale Code Status Info: Full Allergies Info: NKA Psychotropic Info: Bupropion Insulin Sliding Scale Info: See DC summary       Current Medications (10/30/2023):  This is the current hospital active medication list Current Facility-Administered Medications  Medication Dose Route Frequency Provider Last Rate Last Admin   acetaminophen (TYLENOL) tablet 650 mg  650 mg Oral Q4H PRN Baglia, Corrina, PA-C   650 mg at 10/27/23 0853   albuterol (PROVENTIL) (2.5 MG/3ML)  0.083% nebulizer solution 2.5 mg  2.5 mg Inhalation Q4H PRN Baglia, Corrina, PA-C       buPROPion (WELLBUTRIN XL) 24 hr tablet 150 mg  150 mg Oral Daily Baglia, Corrina, PA-C   150 mg at 10/30/23 0902   enoxaparin (LOVENOX) injection 40 mg  40 mg Subcutaneous Q24H Baglia, Corrina, PA-C   40 mg at 10/29/23 2102   feeding supplement (ENSURE ENLIVE / ENSURE PLUS) liquid 237 mL  237 mL  Oral BID BM Baglia, Corrina, PA-C   237 mL at 10/30/23 4132   hydrALAZINE (APRESOLINE) injection 5 mg  5 mg Intravenous Q20 Min PRN Baglia, Corrina, PA-C       hydrALAZINE (APRESOLINE) tablet 10 mg  10 mg Oral Q6H PRN Baglia, Corrina, PA-C       HYDROcodone-acetaminophen (NORCO) 7.5-325 MG per tablet 1-2 tablet  1-2 tablet Oral Q4H PRN Baglia, Corrina, PA-C   2 tablet at 10/30/23 0901   HYDROcodone-acetaminophen (NORCO/VICODIN) 5-325 MG per tablet 1-2 tablet  1-2 tablet Oral Q4H PRN Baglia, Corrina, PA-C       insulin aspart (novoLOG) injection 0-9 Units  0-9 Units Subcutaneous TID WC Baglia, Corrina, PA-C   1 Units at 10/29/23 1721   labetalol (NORMODYNE) injection 10 mg  10 mg Intravenous Q10 min PRN Baglia, Corrina, PA-C       LORazepam (ATIVAN) tablet 0.5 mg  0.5 mg Oral BID PRN Baglia, Corrina, PA-C   0.5 mg at 10/28/23 2041   metoprolol succinate (TOPROL-XL) 24 hr tablet 25 mg  25 mg Oral Daily Baglia, Corrina, PA-C   25 mg at 10/30/23 0902   mometasone-formoterol (DULERA) 200-5 MCG/ACT inhaler 2 puff  2 puff Inhalation BID Baglia, Corrina, PA-C   2 puff at 10/30/23 4401   morphine (PF) 2 MG/ML injection 0.5-1 mg  0.5-1 mg Intravenous Q2H PRN Baglia, Corrina, PA-C       multivitamin with minerals tablet 1 tablet  1 tablet Oral Daily Baglia, Corrina, PA-C   1 tablet at 10/30/23 0272   nicotine (NICODERM CQ - dosed in mg/24 hours) patch 21 mg  21 mg Transdermal Daily Baglia, Corrina, PA-C   21 mg at 10/30/23 5366   nutrition supplement (JUVEN) (JUVEN) powder packet 1 packet  1 packet Oral BID BM Baglia, Corrina, PA-C   1 packet at 10/30/23 0901   ondansetron (ZOFRAN) injection 4 mg  4 mg Intravenous Q6H PRN Baglia, Corrina, PA-C       rosuvastatin (CRESTOR) tablet 20 mg  20 mg Oral Daily Baglia, Corrina, PA-C   20 mg at 10/30/23 0902   sodium chloride flush (NS) 0.9 % injection 3 mL  3 mL Intravenous Q12H Baglia, Corrina, PA-C   3 mL at 10/30/23 0903   sodium chloride flush (NS) 0.9 % injection  3 mL  3 mL Intravenous PRN Baglia, Corrina, PA-C       tamsulosin (FLOMAX) capsule 0.4 mg  0.4 mg Oral Daily Baglia, Corrina, PA-C   0.4 mg at 10/30/23 0902   umeclidinium bromide (INCRUSE ELLIPTA) 62.5 MCG/ACT 1 puff  1 puff Inhalation Daily Baglia, Corrina, PA-C   1 puff at 10/30/23 0807     Discharge Medications: Please see discharge summary for a list of discharge medications.  Relevant Imaging Results:  Relevant Lab Results:   Additional Information SS# [id number]  Carley Hammed, LCSW

## 2023-10-31 DIAGNOSIS — I70221 Atherosclerosis of native arteries of extremities with rest pain, right leg: Secondary | ICD-10-CM | POA: Diagnosis not present

## 2023-10-31 LAB — CBC WITH DIFFERENTIAL/PLATELET
Abs Immature Granulocytes: 0.03 10*3/uL (ref 0.00–0.07)
Basophils Absolute: 0.1 10*3/uL (ref 0.0–0.1)
Basophils Relative: 1 %
Eosinophils Absolute: 0.2 10*3/uL (ref 0.0–0.5)
Eosinophils Relative: 3 %
HCT: 42.6 % (ref 39.0–52.0)
Hemoglobin: 13.2 g/dL (ref 13.0–17.0)
Immature Granulocytes: 0 %
Lymphocytes Relative: 15 %
Lymphs Abs: 1.3 10*3/uL (ref 0.7–4.0)
MCH: 28.3 pg (ref 26.0–34.0)
MCHC: 31 g/dL (ref 30.0–36.0)
MCV: 91.2 fL (ref 80.0–100.0)
Monocytes Absolute: 0.9 10*3/uL (ref 0.1–1.0)
Monocytes Relative: 10 %
Neutro Abs: 5.7 10*3/uL (ref 1.7–7.7)
Neutrophils Relative %: 71 %
Platelets: 210 10*3/uL (ref 150–400)
RBC: 4.67 MIL/uL (ref 4.22–5.81)
RDW: 14.8 % (ref 11.5–15.5)
WBC: 8.2 10*3/uL (ref 4.0–10.5)
nRBC: 0 % (ref 0.0–0.2)

## 2023-10-31 LAB — GLUCOSE, CAPILLARY
Glucose-Capillary: 157 mg/dL — ABNORMAL HIGH (ref 70–99)
Glucose-Capillary: 113 mg/dL — ABNORMAL HIGH (ref 70–99)
Glucose-Capillary: 178 mg/dL — ABNORMAL HIGH (ref 70–99)
Glucose-Capillary: 162 mg/dL — ABNORMAL HIGH (ref 70–99)

## 2023-10-31 LAB — RENAL FUNCTION PANEL
Albumin: 2.7 g/dL — ABNORMAL LOW (ref 3.5–5.0)
Anion gap: 4 — ABNORMAL LOW (ref 5–15)
BUN: 49 mg/dL — ABNORMAL HIGH (ref 8–23)
CO2: 30 mmol/L (ref 22–32)
Calcium: 8.8 mg/dL — ABNORMAL LOW (ref 8.9–10.3)
Chloride: 103 mmol/L (ref 98–111)
Creatinine, Ser: 1.6 mg/dL — ABNORMAL HIGH (ref 0.61–1.24)
GFR, Estimated: 44 mL/min — ABNORMAL LOW (ref >60)
Glucose, Bld: 152 mg/dL — ABNORMAL HIGH (ref 70–99)
Phosphorus: 2.7 mg/dL (ref 2.5–4.6)
Potassium: 4.9 mmol/L (ref 3.5–5.1)
Sodium: 137 mmol/L (ref 135–145)

## 2023-10-31 NOTE — Progress Notes (Signed)
  Progress Note   Patient: Clarence Maldonado [id number]:[id number] DOB: 12-Apr-1946 DOA: 10/22/2023     8 DOS: the patient was seen and examined on 10/31/2023   Brief hospital course: Patient is a 78 year old male with past medical history significant for chronic kidney disease stage IIIb, diabetes mellitus, hypertension, hyperlipidemia and BPH.  Patient was admitted with right lower extremity critical limb ischemia and RLE cellulitis in the setting of failed outpatient therapy with doxycycline.  Patient has undergone right AKA.  10/31/2023: Patient seen.  No new complaints.  Surgery input is appreciated.  Assessment and Plan: R lower ext critical limb ischemia  - s/p AKA (10/28/2023) - Vascular advised to stop IV antibx s/p AKA (source control has been obtained) - Ensure enlive PO bid  - MVI PO daily  - Juven 1 pack PO bid    HTN  - Toprol XL 25 mg PO daily  - Hydralazine PRN    HLD  - Crestor 20 mg PO daily    DM2 - Novolog SS   CKD3b - Stable;monitor    COPD - Albuterol q4 hr PRN  - Dulera 2 puff bid  - Incruse ellipta 1 puff daily    BPH - Flomax 0.4 mg PO daily    Anxiety/depression - Wellbutrin XL 150 mg PO daily. - Ativan 0.5 mg PO bid PRN    Subjective:  No new complaints. No chest pain or shortness of breath. No fever or chills.  Physical Exam: Vitals:   10/31/23 0405 10/31/23 0733 10/31/23 1123 10/31/23 1515  BP: 120/66  (!) 125/59 115/66  Pulse: 84 87  78  Resp: 18 17 18 17   Temp: 99.1 F (37.3 C) 97.8 F (36.6 C) 98.2 F (36.8 C) 98.1 F (36.7 C)  TempSrc: Oral Oral Oral Oral  SpO2: 94% 92%  95%  Weight:      Height:       Physical Exam HENT:     Head: Normocephalic.     Mouth/Throat:     Mouth: Mucous membranes are moist.  Cardiovascular:     Rate and Rhythm: Normal rate and regular rhythm.  Pulmonary:     Effort: Pulmonary effort is normal.  Abdominal:     Palpations: Abdomen is soft.  Musculoskeletal:     Cervical back: Neck supple.      Comments: R AKA   Skin:    General: Skin is warm.  Neurological:     Mental Status: He is alert.  Psychiatric:        Mood and Affect: Mood normal.       Disposition: Status is: Inpatient Remains inpatient appropriate because: s/p AKA requiring ongoing inpatient follow up from vascular surgery   Planned Discharge Destination:  Dispo per pt's clinical progress     Time spent: 35 minutes  Author: Barnetta Chapel, MD 10/31/2023 6:25 PM  For on call review [email or web address].

## 2023-10-31 NOTE — Progress Notes (Signed)
  Progress Note    10/31/2023 9:17 AM 3 Days Post-Op  Subjective:  pain controlled this morning   Vitals:   10/31/23 0405 10/31/23 0733  BP: 120/66   Pulse: 84 87  Resp: 18 17  Temp: 99.1 F (37.3 C) 97.8 F (36.6 C)  SpO2: 94% 92%   Physical Exam: Lungs:  non labored Incisions:  R AKA dressing left in place Neurologic: A&O  CBC    Component Value Date/Time   WBC 8.2 10/31/2023 0251   RBC 4.67 10/31/2023 0251   HGB 13.2 10/31/2023 0251   HCT 42.6 10/31/2023 0251   HCT 32.2 (L) 05/30/2019 1552   PLT 210 10/31/2023 0251   MCV 91.2 10/31/2023 0251   MCH 28.3 10/31/2023 0251   MCHC 31.0 10/31/2023 0251   RDW 14.8 10/31/2023 0251   RDW 14.9 01/07/2015 0959   LYMPHSABS 1.3 10/31/2023 0251   LYMPHSABS 2.1 01/07/2015 0959   MONOABS 0.9 10/31/2023 0251   EOSABS 0.2 10/31/2023 0251   EOSABS 0.3 01/07/2015 0959   BASOSABS 0.1 10/31/2023 0251   BASOSABS 0.1 01/07/2015 0959    BMET    Component Value Date/Time   NA 137 10/31/2023 0251   NA 141 01/08/2016 0905   K 4.9 10/31/2023 0251   CL 103 10/31/2023 0251   CO2 30 10/31/2023 0251   GLUCOSE 152 (H) 10/31/2023 0251   BUN 49 (H) 10/31/2023 0251   BUN 26 01/08/2016 0905   CREATININE 1.60 (H) 10/31/2023 0251   CREATININE 1.93 (H) 09/23/2023 1528   CALCIUM 8.8 (L) 10/31/2023 0251   GFRNONAA 44 (L) 10/31/2023 0251   GFRNONAA 46 (L) 06/13/2020 1222   GFRAA 54 (L) 06/13/2020 1222    INR    Component Value Date/Time   INR 1.0 01/26/2020 1525     Intake/Output Summary (Last 24 hours) at 10/31/2023 8295 Last data filed at 10/31/2023 0405 Gross per 24 hour  Intake 480 ml  Output 2100 ml  Net -1620 ml     Assessment/Plan:  78 y.o. male is s/p R AKA 3 Days Post-Op   Pain better controlled overnight and this morning R AKA dressing was changed this morning; vascular will change tomorrow morning and re-evaluate incision Ok for d/c to SNF when approved; office will arrange staple removal in 5-6  weeks   Emilie Rutter, PA-C [facility] [phone] 10/31/2023 9:17 AM

## 2023-10-31 NOTE — Progress Notes (Signed)
Mobility Specialist Progress Note:    10/31/23 1350  Mobility  Activity Transferred from bed to chair  Level of Assistance Minimal assist, patient does 75% or more (+2)  Assistive Device Stedy  RLE Weight Bearing Per Provider Order NWB  Activity Response Tolerated well  Mobility Referral Yes  Mobility visit 1 Mobility  Mobility Specialist Start Time (ACUTE ONLY) 1135  Mobility Specialist Stop Time (ACUTE ONLY) 1151  Mobility Specialist Time Calculation (min) (ACUTE ONLY) 16 min   Pt received in bed agreeable to mobility. Pt was able to get to EOB w/ MinA and perform some LE exercises. Was able to get the RN to assist getting pt to the chair, was able to dtand from an elevated surface w/ MinA +2. Successfully transferred to the chair w/o fault. Call bell and personal belongings in reach. All needs met. Chair alarm on.  Thompson Grayer Mobility Specialist  Please contact vis Secure Chat or  Rehab Office [phone]

## 2023-11-01 DIAGNOSIS — L03115 Cellulitis of right lower limb: Secondary | ICD-10-CM | POA: Diagnosis not present

## 2023-11-01 LAB — GLUCOSE, CAPILLARY
Glucose-Capillary: 98 mg/dL (ref 70–99)
Glucose-Capillary: 173 mg/dL — ABNORMAL HIGH (ref 70–99)
Glucose-Capillary: 151 mg/dL — ABNORMAL HIGH (ref 70–99)
Glucose-Capillary: 150 mg/dL — ABNORMAL HIGH (ref 70–99)

## 2023-11-01 LAB — SURGICAL PATHOLOGY

## 2023-11-01 LAB — LACTOFERRIN, FECAL, QUALITATIVE: Lactoferrin, Fecal, Qual: NEGATIVE

## 2023-11-01 NOTE — Progress Notes (Signed)
  Progress Note    11/01/2023 7:58 AM 4 Days Post-Op  Subjective:  intermittent pain in right AKA mostly with movement   Vitals:   11/01/23 0318 11/01/23 0752  BP: 138/65 127/65  Pulse: 69 89  Resp: 18 20  Temp: 97.8 F (36.6 C) 98.4 F (36.9 C)  SpO2: 91% 99%   Physical Exam: Cardiac:  regular Lungs:  non labored Incisions:  Right AKA incision is intact. There is some bruising on the posterior flap as show below.  A little SS drainage from the middle of the incision   Neurologic: alert and oriented  CBC    Component Value Date/Time   WBC 8.2 10/31/2023 0251   RBC 4.67 10/31/2023 0251   HGB 13.2 10/31/2023 0251   HCT 42.6 10/31/2023 0251   HCT 32.2 (L) 05/30/2019 1552   PLT 210 10/31/2023 0251   MCV 91.2 10/31/2023 0251   MCH 28.3 10/31/2023 0251   MCHC 31.0 10/31/2023 0251   RDW 14.8 10/31/2023 0251   RDW 14.9 01/07/2015 0959   LYMPHSABS 1.3 10/31/2023 0251   LYMPHSABS 2.1 01/07/2015 0959   MONOABS 0.9 10/31/2023 0251   EOSABS 0.2 10/31/2023 0251   EOSABS 0.3 01/07/2015 0959   BASOSABS 0.1 10/31/2023 0251   BASOSABS 0.1 01/07/2015 0959    BMET    Component Value Date/Time   NA 137 10/31/2023 0251   NA 141 01/08/2016 0905   K 4.9 10/31/2023 0251   CL 103 10/31/2023 0251   CO2 30 10/31/2023 0251   GLUCOSE 152 (H) 10/31/2023 0251   BUN 49 (H) 10/31/2023 0251   BUN 26 01/08/2016 0905   CREATININE 1.60 (H) 10/31/2023 0251   CREATININE 1.93 (H) 09/23/2023 1528   CALCIUM 8.8 (L) 10/31/2023 0251   GFRNONAA 44 (L) 10/31/2023 0251   GFRNONAA 46 (L) 06/13/2020 1222   GFRAA 54 (L) 06/13/2020 1222    INR    Component Value Date/Time   INR 1.0 01/26/2020 1525     Intake/Output Summary (Last 24 hours) at 11/01/2023 0758 Last data filed at 11/01/2023 0610 Gross per 24 hour  Intake --  Output 1201 ml  Net -1201 ml     Assessment/Plan:  78 y.o. male is s/p right AKA 4 Days Post-Op   Right AKA intact. There is SS drainage mostly from middle of  incision line Posterior flap ecchymotic. Kelix and ACE wrap reapplied. Will need to continue to monitor  Will follow while in house Staples will come out in 4-6 weeks  DVT prophylaxis:  lovenox   Graceann Congress, PA-C [facility] [phone] 11/01/2023 7:58 AM

## 2023-11-01 NOTE — TOC Progression Note (Signed)
Transition of Care [facility]) - Progression Note    Patient Details  Name: Clarence Maldonado MRN: [id number] Date of Birth: [birth year]  [facility] CM/SW Contact  Eduard Roux, [place] Phone Number: 11/01/2023, 2:57 PM  Clinical Narrative:     CSW met with patient and his friend,[name]. CSW introduced self and role. CSW and [name] discussed CIR vs SNF.  Patient's friend was initially insisting patient goes to CIR. CSW explained, since the patient does not have caregiver support in the home, patient is not being considered for CIR. CSW provide [name] w/ bed offers. [name] (patient agreed) to contact his daughter [name], he reports is a retired IT sales professional [phone]. [name] was updated on rehab facilities and why CIR was not an option. All questions answered.  [name] & [name] will review the SNF options and inform CSW of their SNF choice.   TOC will continue to follow and assist with discharge planning.  Antony Blackbird, MSW, LCSW Clinical Social Worker    Expected Discharge Plan: Skilled Nursing Facility Barriers to Discharge: Continued Medical Work up  Expected Discharge Plan and Services                                               Social Determinants of Health (SDOH) Interventions SDOH Screenings   Food Insecurity: No Food Insecurity (10/20/2023)  Housing: Unknown (10/20/2023)  Transportation Needs: No Transportation Needs (10/20/2023)  Utilities: Not At Risk (10/20/2023)  Depression (PHQ2-9): Low Risk  (10/14/2023)  Financial Resource Strain: Low Risk  (03/23/2019)  Physical Activity: Inactive (03/23/2019)  Social Connections: Socially Isolated (03/23/2019)  Stress: No Stress Concern Present (03/23/2019)  Tobacco Use: Medium Risk (10/28/2023)    Readmission Risk Interventions     No data to display

## 2023-11-01 NOTE — Anesthesia Postprocedure Evaluation (Signed)
Anesthesia Post Note  Patient: Clarence Maldonado  Procedure(s) Performed: AMPUTATION ABOVE KNEE (Right: Leg Upper)     Patient location during evaluation: PACU Anesthesia Type: General Level of consciousness: awake and patient cooperative Pain management: pain level controlled Vital Signs Assessment: post-procedure vital signs reviewed and stable Respiratory status: spontaneous breathing, nonlabored ventilation, respiratory function stable and patient connected to nasal cannula oxygen Cardiovascular status: blood pressure returned to baseline and stable Postop Assessment: no apparent nausea or vomiting Anesthetic complications: no   No notable events documented.                  Sylvanna Burggraf

## 2023-11-01 NOTE — Care Management Important Message (Signed)
Important Message  Patient Details  Name: Clarence Maldonado MRN: [id number] Date of Birth: 28-Jan-1946   Important Message Given:  Yes - Medicare IM     [name] 11/01/2023, 10:37 AM

## 2023-11-01 NOTE — Progress Notes (Signed)
PROGRESS NOTE    Clarence Maldonado  VHQ:[id number] DOB: 01-May-1946 DOA: 10/22/2023 PCP: Octavia Heir, NP   Brief Narrative:  Patient is a 78 year old male with past medical history significant for chronic kidney disease stage IIIb, diabetes mellitus, hypertension, hyperlipidemia and BPH.  Patient was admitted with right lower extremity critical limb ischemia and RLE cellulitis in the setting of failed outpatient therapy with doxycycline.  Patient has undergone right AKA.   10/31/2023: Patient seen.  No new complaints.  Surgery input is appreciated.  Assessment & Plan:   Principal Problem:   Cellulitis Active Problems:   Cellulitis and abscess of right lower extremity   Malnutrition of moderate degree  R lower ext critical limb ischemia  - s/p AKA (10/28/2023) - Vascular advised to stop IV antibx s/p AKA (source control has been obtained) - Ensure enlive PO bid  - MVI PO daily  - Juven 1 pack PO bid    HTN  -Blood pressure controlled on Toprol XL 25 mg PO daily  - Hydralazine PRN    HLD  - Crestor 20 mg PO daily    DM2 - Novolog SS, blood sugar controlled.   UXL2G - Stable;monitor    COPD - Albuterol q4 hr PRN  - Dulera 2 puff bid  - Incruse ellipta 1 puff daily    BPH - Flomax 0.4 mg PO daily    Anxiety/depression - Wellbutrin XL 150 mg PO daily. - Ativan 0.5 mg PO bid PRN    DVT prophylaxis: SCD's Start: 10/28/23 1226 enoxaparin (LOVENOX) injection 40 mg Start: 10/24/23 2200   Code Status: Full Code  Family Communication: A friend present at bedside.  Plan of care discussed with patient in length and he/she verbalized understanding and agreed with it.  Status is: Inpatient Remains inpatient appropriate because: Patient medically stable, pending placement to SNF, TOC working on it.   Estimated body mass index is 25.79 kg/m as calculated from the following:   Height as of this encounter: 5\' 5"  (1.651 m).   Weight as of this encounter: 70.3 kg.     Nutritional Assessment: Body mass index is 25.79 kg/m.Marland Kitchen Seen by dietician.  I agree with the assessment and plan as outlined below: Nutrition Status: Nutrition Problem: Moderate Malnutrition Etiology: social / environmental circumstances Signs/Symptoms: severe muscle depletion, moderate fat depletion Interventions: Refer to RD note for recommendations  . Skin Assessment: I have examined the patient's skin and I agree with the wound assessment as performed by the wound care RN as outlined below:    Consultants:  Vascular surgery  Procedures:  As above  Antimicrobials:  Anti-infectives (From admission, onward)    Start     Dose/Rate Route Frequency Ordered Stop   10/23/23 1800  cefTRIAXone (ROCEPHIN) 2 g in sodium chloride 0.9 % 100 mL IVPB  Status:  Discontinued       Placed in "And" Linked Group   2 g 200 mL/hr over 30 Minutes Intravenous Every 24 hours 10/23/23 1613 10/29/23 1209   10/23/23 1800  metroNIDAZOLE (FLAGYL) IVPB 500 mg  Status:  Discontinued       Placed in "And" Linked Group   500 mg 100 mL/hr over 60 Minutes Intravenous Every 12 hours 10/23/23 1613 10/29/23 1209   10/23/23 0000  ceFAZolin (ANCEF) IVPB 1 g/50 mL premix  Status:  Discontinued        1 g 100 mL/hr over 30 Minutes Intravenous Every 8 hours 10/22/23 1856 10/23/23 1613   10/22/23 1715  ceFAZolin (ANCEF) IVPB 2g/100 mL premix        2 g 200 mL/hr over 30 Minutes Intravenous  Once 10/22/23 1705 10/22/23 1806         Subjective: Patient seen and examined.  He has no complaints.  Objective: Vitals:   11/01/23 0752 11/01/23 0755 11/01/23 0900 11/01/23 1200  BP: 127/65  108/64 (!) 132/97  Pulse: 89  78 72  Resp: 20  18 18   Temp: 98.4 F (36.9 C)   98.8 F (37.1 C)  TempSrc: Oral   Oral  SpO2: 99% 99%  98%  Weight:      Height:        Intake/Output Summary (Last 24 hours) at 11/01/2023 1351 Last data filed at 11/01/2023 0610 Gross per 24 hour  Intake --  Output 1201 ml  Net -1201  ml   Filed Weights   10/22/23 1435 10/22/23 1930 10/28/23 0820  Weight: 74.8 kg 65 kg 70.3 kg    Examination:  General exam: Appears calm and comfortable  Respiratory system: Clear to auscultation. Respiratory effort normal. Cardiovascular system: S1 & S2 heard, RRR. No JVD, murmurs, rubs, gallops or clicks. No pedal edema. Gastrointestinal system: Abdomen is nondistended, soft and nontender. No organomegaly or masses felt. Normal bowel sounds heard. Central nervous system: Alert and oriented. No focal neurological deficits. Extremities: Right AKA Psychiatry: Judgement and insight appear normal. Mood & affect appropriate.    Data Reviewed: I have personally reviewed following labs and imaging studies  CBC: Recent Labs  Lab 10/26/23 0328 10/28/23 0322 10/29/23 0319 10/31/23 0251  WBC 6.7 8.5 12.6* 8.2  NEUTROABS  --   --   --  5.7  HGB 13.0 14.5 14.1 13.2  HCT 41.6 45.5 46.0 42.6  MCV 90.2 89.6 92.2 91.2  PLT 166 211 233 210   Basic Metabolic Panel: Recent Labs  Lab 10/26/23 0328 10/28/23 0322 10/29/23 0319 10/31/23 0251  NA 139 139 137 137  K 3.9 4.5 5.3* 4.9  CL 112* 97* 101 103  CO2 22 25 28 30   GLUCOSE 81 105* 215* 152*  BUN 31* 34* 35* 49*  CREATININE 1.07 1.47* 1.72* 1.60*  CALCIUM 6.7* 8.6* 8.8* 8.8*  MG 1.7  --   --   --   PHOS 2.4*  --   --  2.7   GFR: Estimated Creatinine Clearance: 34.2 mL/min (A) (by C-G formula based on SCr of 1.6 mg/dL (H)). Liver Function Tests: Recent Labs  Lab 10/26/23 0328 10/31/23 0251  AST 14*  --   ALT 7  --   ALKPHOS 39  --   BILITOT 0.4  --   PROT 4.5*  --   ALBUMIN 2.1* 2.7*   No results for input(s): "LIPASE", "AMYLASE" in the last 168 hours. No results for input(s): "AMMONIA" in the last 168 hours. Coagulation Profile: No results for input(s): "INR", "PROTIME" in the last 168 hours. Cardiac Enzymes: No results for input(s): "CKTOTAL", "CKMB", "CKMBINDEX", "TROPONINI" in the last 168 hours. BNP (last 3  results) No results for input(s): "PROBNP" in the last 8760 hours. HbA1C: No results for input(s): "HGBA1C" in the last 72 hours. CBG: Recent Labs  Lab 10/31/23 1235 10/31/23 1617 10/31/23 2046 11/01/23 0551 11/01/23 1159  GLUCAP 113* 178* 162* 98 173*   Lipid Profile: No results for input(s): "CHOL", "HDL", "LDLCALC", "TRIG", "CHOLHDL", "LDLDIRECT" in the last 72 hours. Thyroid Function Tests: No results for input(s): "TSH", "T4TOTAL", "FREET4", "T3FREE", "THYROIDAB" in the last 72 hours. Anemia  Panel: No results for input(s): "VITAMINB12", "FOLATE", "FERRITIN", "TIBC", "IRON", "RETICCTPCT" in the last 72 hours. Sepsis Labs: No results for input(s): "PROCALCITON", "LATICACIDVEN" in the last 168 hours.  Recent Results (from the past 240 hours)  Culture, blood (Routine X 2) w Reflex to ID Panel     Status: None   Collection Time: 10/22/23  6:55 PM   Specimen: BLOOD  Result Value Ref Range Status   Specimen Description   Final    BLOOD BLOOD RIGHT ARM Performed at [facility], [address], [place], [place] [address]    Special Requests   Final    BOTTLES DRAWN AEROBIC AND ANAEROBIC Blood Culture adequate volume Performed at [facility], [address] [place], [place] [address]    Culture   Final    NO GROWTH 5 DAYS Performed at [facility], [address] [place], [place] [address]    Report Status 10/27/2023 FINAL  Final  Culture, blood (Routine X 2) w Reflex to ID Panel     Status: None   Collection Time: 10/22/23  8:09 PM   Specimen: BLOOD  Result Value Ref Range Status   Specimen Description   Final    BLOOD BLOOD LEFT ARM Performed at [facility], [address], [place], [place] [address]    Special Requests   Final    BOTTLES DRAWN AEROBIC ONLY Blood Culture adequate volume Performed at [facility], [address], [place], [place] [address]    Culture   Final    NO  GROWTH 5 DAYS Performed at [facility], [address] [place], [place] [address]    Report Status 10/27/2023 FINAL  Final  Surgical pcr screen     Status: None   Collection Time: 10/27/23  2:16 PM   Specimen: Nasal Mucosa; Nasal Swab  Result Value Ref Range Status   MRSA, PCR NEGATIVE NEGATIVE Final   Staphylococcus aureus NEGATIVE NEGATIVE Final    Comment: (NOTE) The Xpert SA Assay (FDA approved for NASAL specimens in patients 80 years of age and older), is one component of a comprehensive surveillance program. It is not intended to diagnose infection nor to guide or monitor treatment. Performed at [facility], [address] [place], [place] [address]      Radiology Studies: No results found.  Scheduled Meds:  buPROPion  150 mg Oral Daily   enoxaparin (LOVENOX) injection  40 mg Subcutaneous Q24H   feeding supplement  237 mL Oral BID BM   insulin aspart  0-9 Units Subcutaneous TID WC   metoprolol succinate  25 mg Oral Daily   mometasone-formoterol  2 puff Inhalation BID   multivitamin with minerals  1 tablet Oral Daily   nicotine  21 mg Transdermal Daily   nutrition supplement (JUVEN)  1 packet Oral BID BM   rosuvastatin  20 mg Oral Daily   sodium chloride flush  3 mL Intravenous Q12H   tamsulosin  0.4 mg Oral Daily   umeclidinium bromide  1 puff Inhalation Daily   Continuous Infusions:   LOS: 9 days   Hughie Closs, MD [facility]  11/01/2023, 1:51 PM   *Please note that this is a verbal dictation therefore any spelling or grammatical errors are due to the "Dragon Medical One" system interpretation.  Please page via Amion and do not message via secure chat for urgent patient care matters. Secure chat can be used for non  urgent patient care matters.  How to contact the [facility] Attending or Consulting provider 7A - 7P or covering provider during after hours 7P -7A, for this patient?  Check the care team in [facility] and look for a) attending/consulting  TRH provider listed and b) the [facility] team listed. Page or secure chat 7A-7P. Log into [email or web address] and use [place]'s universal password to access. If you do not have the password, please contact the hospital operator. Locate the [facility] provider you are looking for under [facility] and page to a number that you can be directly reached. If you still have difficulty reaching the provider, please page the [facility] (Director on Call) for the Hospitalists listed on amion for assistance.

## 2023-11-01 NOTE — Progress Notes (Signed)
Physical Therapy Treatment Patient Details Name: Clarence Maldonado MRN: [id number] DOB: [birth year] Today's Date: 11/01/2023   History of Present Illness Pt is a 78 y.o. male admitted 1/17 with RLE cellulitis and limb threatening ischemia. He underwent R AKA 1/23. PMH: CKD 3B, tobacco use disorder, DM2, hyperlipidemia, HTN, depression, BPH    PT Comments  Pt received in bed, agreeable to participation in therapy. Gentle A/AAROM exercises R residual limb prior to transition OOB. Mod assist supine to sit, and mod assist lateral scoot transfer toward L. Fair sitting balance. Increased time and step-by-step verbal cues for all functional mobility skills.  Pt on 3L continuous O2. Pt in recliner at end of session, foot of recliner elevated.    If plan is discharge home, recommend the following:     Can travel by private vehicle        Equipment Recommendations  Wheelchair (measurements PT);Wheelchair cushion (measurements PT)    Recommendations for Other Services       Precautions / Restrictions Precautions Precautions: Fall;Other (comment) Precaution Comments: s/p R AKA Restrictions RLE Weight Bearing Per Provider Order: Non weight bearing     Mobility  Bed Mobility Overal bed mobility: Needs Assistance Bed Mobility: Supine to Sit     Supine to sit: Mod assist, HOB elevated, Used rails     General bed mobility comments: increased time, cues for sequencing, assist with trunk, bed pad to scoot to EOB    Transfers Overall transfer level: Needs assistance   Transfers: Bed to chair/wheelchair/BSC            Lateral/Scoot Transfers: Mod assist General transfer comment: increased time,step by step cues for sequencing, assist with bed pad under hips for lateral scoot bed to recliner toward L    Ambulation/Gait                   Stairs             Wheelchair Mobility     Tilt Bed    Modified Rankin (Stroke Patients Only)       Balance Overall  balance assessment: Needs assistance Sitting-balance support: Feet supported, Single extremity supported Sitting balance-Leahy Scale: Fair                                      Cognition Arousal: Alert Behavior During Therapy: WFL for tasks assessed/performed Overall Cognitive Status: Impaired/Different from baseline Area of Impairment: Orientation, Attention, Memory, Following commands, Safety/judgement, Awareness, Problem solving                 Orientation Level: Disoriented to, Time Current Attention Level: Sustained Memory: Decreased short-term memory Following Commands: Follows one step commands consistently Safety/Judgement: Decreased awareness of safety, Decreased awareness of deficits Awareness: Emergent Problem Solving: Slow processing, Requires verbal cues, Requires tactile cues          Exercises Amputee Exercises Gluteal Sets: AROM, Both, 10 reps, Supine Hip ABduction/ADduction: AAROM, Right, 10 reps, Supine    General Comments General comments (skin integrity, edema, etc.): VSS on 3L      Pertinent Vitals/Pain Pain Assessment Pain Assessment: Faces Faces Pain Scale: Hurts even more Pain Location: R residual limb Pain Descriptors / Indicators: Sore, Grimacing, Guarding Pain Intervention(s): Monitored during session, Repositioned    Home Living  Prior Function            PT Goals (current goals can now be found in the care plan section) Acute Rehab PT Goals Patient Stated Goal: to walk, independence Progress towards PT goals: Progressing toward goals    Frequency    Min 1X/week      PT Plan      Co-evaluation              AM-PAC PT "6 Clicks" Mobility   Outcome Measure  Help needed turning from your back to your side while in a flat bed without using bedrails?: A Little Help needed moving from lying on your back to sitting on the side of a flat bed without using bedrails?: A  Lot Help needed moving to and from a bed to a chair (including a wheelchair)?: A Lot Help needed standing up from a chair using your arms (e.g., wheelchair or bedside chair)?: Total Help needed to walk in hospital room?: Total Help needed climbing 3-5 steps with a railing? : Total 6 Click Score: 10    End of Session Equipment Utilized During Treatment: Oxygen Activity Tolerance: Patient tolerated treatment well Patient left: in chair;with call bell/phone within reach;with chair alarm set Nurse Communication: Mobility status;Need for lift equipment (stedy vs lateral scoot back to bed) PT Visit Diagnosis: Other abnormalities of gait and mobility (R26.89);Muscle weakness (generalized) (M62.81);Pain Pain - Right/Left: Right Pain - part of body: Leg     Time: 1610-9604 PT Time Calculation (min) (ACUTE ONLY): 26 min  Charges:    $Therapeutic Exercise: 8-22 mins $Therapeutic Activity: 23-37 mins PT General Charges $$ ACUTE PT VISIT: 1 Visit                     Ferd Glassing., PT  Office # [phone]    Ilda Foil 11/01/2023, 8:59 AM

## 2023-11-02 DIAGNOSIS — L03115 Cellulitis of right lower limb: Secondary | ICD-10-CM | POA: Diagnosis not present

## 2023-11-02 LAB — GLUCOSE, CAPILLARY
Glucose-Capillary: 120 mg/dL — ABNORMAL HIGH (ref 70–99)
Glucose-Capillary: 163 mg/dL — ABNORMAL HIGH (ref 70–99)
Glucose-Capillary: 166 mg/dL — ABNORMAL HIGH (ref 70–99)
Glucose-Capillary: 100 mg/dL — ABNORMAL HIGH (ref 70–99)

## 2023-11-02 LAB — BASIC METABOLIC PANEL
Anion gap: 9 (ref 5–15)
BUN: 46 mg/dL — ABNORMAL HIGH (ref 8–23)
CO2: 27 mmol/L (ref 22–32)
Calcium: 8.9 mg/dL (ref 8.9–10.3)
Chloride: 100 mmol/L (ref 98–111)
Creatinine, Ser: 1.41 mg/dL — ABNORMAL HIGH (ref 0.61–1.24)
GFR, Estimated: 52 mL/min — ABNORMAL LOW (ref >60)
Glucose, Bld: 158 mg/dL — ABNORMAL HIGH (ref 70–99)
Potassium: 5.1 mmol/L (ref 3.5–5.1)
Sodium: 136 mmol/L (ref 135–145)

## 2023-11-02 MED ORDER — HYDROCODONE-ACETAMINOPHEN 5-325 MG PO TABS: 1.0000 | ORAL_TABLET | ORAL | 0 refills | Status: DC | PRN

## 2023-11-02 NOTE — Progress Notes (Signed)
Nutrition Follow-up  DOCUMENTATION CODES:   Non-severe (moderate) malnutrition in context of social or environmental circumstances  INTERVENTION:  Continue carb modified diet as ordered; double protein portions with all meals Ensure Enlive po BID, each supplement provides 350 kcal and 20 grams of protein. Juven BID to support wound healing MVI with minerals daily  NUTRITION DIAGNOSIS:   Moderate Malnutrition related to social / environmental circumstances as evidenced by severe muscle depletion, moderate fat depletion. - remains applicable  GOAL:   Patient will meet greater than or equal to 90% of their needs - goal unmet, addressing via meals and nutrition supplements  MONITOR:   PO intake, Weight trends, Supplement acceptance, Skin  REASON FOR ASSESSMENT:   Consult Wound healing  ASSESSMENT:   78 y.o male with PMH of HTN. HLD. T2DM, CKD IIIb, COPD, BPH, tobacco use. Transferred from [facility] for right lower leg infection.  1/20 - aortogram with right lower extremity angiogram   1/23 - s/p R AKA  Pt is medically stable for discharge. Pending SNF placement.   Spoke with pt at bedside. He reports having a good appetite though unable to quantify amount he's consuming. RN mentions that he has good PO intake and is consuming nutrition supplements. Pt reports that he enjoys the Ensure and Juven and is amenable to continuing these.   Meal completions: 1/25: 100% breakfast and lunch, 75% dinner 1/27: 90% breakfast, 100% lunch 1/28: 25% breakfast  Admit weight: 65 kg Current weight: 70.3 kg  Medications: SSI 0-9 units TID, MVI  Labs:  BUN 46 Cr 1.41 GFR 52 CBG's 120-173 x24 hours  Diet Order:   Diet Order             Diet Carb Modified Fluid consistency: Thin; Room service appropriate? No  Diet effective now                   EDUCATION NEEDS:   Education needs have been addressed  Skin:  Skin Assessment: Skin Integrity Issues: Skin Integrity  Issues:: Incisions, Other (Comment) Incisions: R closed incision Other: RLE wound, R toe wound  Last BM:  1/28 type 6 large  Height:   Ht Readings from Last 1 Encounters:  10/28/23 5\' 5"  (1.651 m)    Weight:   Wt Readings from Last 1 Encounters:  10/28/23 70.3 kg   BMI:  Body mass index is 28.7 kg/m (adj for R AKA)  Estimated Nutritional Needs:   Kcal:  1600-1800 kcal  Protein:  80-100 gm  Fluid:  >1.6L  Drusilla Kanner, RDN, LDN Clinical Nutrition

## 2023-11-02 NOTE — Plan of Care (Signed)

## 2023-11-02 NOTE — TOC Progression Note (Signed)
Transition of Care [facility]) - Progression Note    Patient Details  Name: Clarence Maldonado MRN: [id number] Date of Birth: [birth year]  [facility] CM/SW Contact  Eduard Roux, [place] Phone Number: 11/02/2023, 3:39 PM  Clinical Narrative:     Received approval for SNF/ [facility] -  approved 1/28 - 1/30 Vesta Mixer ID # [id number]   Updated SNF, anticipate d/c tomorrow   Antony Blackbird, MSW, LCSW Clinical Social Worker    Expected Discharge Plan: Skilled Nursing Facility Barriers to Discharge: Continued Medical Work up  Expected Discharge Plan and Services                                               Social Determinants of Health (SDOH) Interventions SDOH Screenings   Food Insecurity: No Food Insecurity (10/20/2023)  Housing: Unknown (10/20/2023)  Transportation Needs: No Transportation Needs (10/20/2023)  Utilities: Not At Risk (10/20/2023)  Depression (PHQ2-9): Low Risk  (10/14/2023)  Financial Resource Strain: Low Risk  (03/23/2019)  Physical Activity: Inactive (03/23/2019)  Social Connections: Socially Isolated (03/23/2019)  Stress: No Stress Concern Present (03/23/2019)  Tobacco Use: Medium Risk (10/28/2023)    Readmission Risk Interventions     No data to display

## 2023-11-02 NOTE — Progress Notes (Signed)
PROGRESS NOTE    Clarence Maldonado  ZOX:[id number] DOB: 01-22-1946 DOA: 10/22/2023 PCP: Octavia Heir, NP   Brief Narrative:  Patient is a 78 year old male with past medical history significant for chronic kidney disease stage IIIb, diabetes mellitus, hypertension, hyperlipidemia and BPH.  Patient was admitted with right lower extremity critical limb ischemia and RLE cellulitis in the setting of failed outpatient therapy with doxycycline.  Patient has undergone right AKA.   10/31/2023: Patient seen.  No new complaints.  Surgery input is appreciated.  Assessment & Plan:   Principal Problem:   Cellulitis Active Problems:   Cellulitis and abscess of right lower extremity   Malnutrition of moderate degree  R lower ext critical limb ischemia  - s/p AKA (10/28/2023) - Vascular advised to stop IV antibx s/p AKA (source control has been obtained) - Ensure enlive PO bid  - MVI PO daily  - Juven 1 pack PO bid    HTN  -Blood pressure controlled on Toprol XL 25 mg PO daily  - Hydralazine PRN    HLD  - Crestor 20 mg PO daily    DM2 - Novolog SS, blood sugar controlled.   [id number] - Stable;monitor    COPD - Albuterol q4 hr PRN  - Dulera 2 puff bid  - Incruse ellipta 1 puff daily    BPH - Flomax 0.4 mg PO daily    Anxiety/depression - Wellbutrin XL 150 mg PO daily. - Ativan 0.5 mg PO bid PRN    DVT prophylaxis: SCD's Start: 10/28/23 1226 enoxaparin (LOVENOX) injection 40 mg Start: 10/24/23 2200   Code Status: Full Code  Family Communication: None at bedside.  Plan of care discussed with patient in length and he/she verbalized understanding and agreed with it.  Status is: Inpatient Remains inpatient appropriate because: Patient medically stable, pending placement to SNF, TOC working on it.  Per TOC, his friend has not selected a facility yet.   Estimated body mass index is 25.79 kg/m as calculated from the following:   Height as of this encounter: 5\' 5"  (1.651 m).   Weight as  of this encounter: 70.3 kg.    Nutritional Assessment: Body mass index is 25.79 kg/m.Marland Kitchen Seen by dietician.  I agree with the assessment and plan as outlined below: Nutrition Status: Nutrition Problem: Moderate Malnutrition Etiology: social / environmental circumstances Signs/Symptoms: severe muscle depletion, moderate fat depletion Interventions: Refer to RD note for recommendations  . Skin Assessment: I have examined the patient's skin and I agree with the wound assessment as performed by the wound care RN as outlined below:    Consultants:  Vascular surgery  Procedures:  As above  Antimicrobials:  Anti-infectives (From admission, onward)    Start     Dose/Rate Route Frequency Ordered Stop   10/23/23 1800  cefTRIAXone (ROCEPHIN) 2 g in sodium chloride 0.9 % 100 mL IVPB  Status:  Discontinued       Placed in "And" Linked Group   2 g 200 mL/hr over 30 Minutes Intravenous Every 24 hours 10/23/23 1613 10/29/23 1209   10/23/23 1800  metroNIDAZOLE (FLAGYL) IVPB 500 mg  Status:  Discontinued       Placed in "And" Linked Group   500 mg 100 mL/hr over 60 Minutes Intravenous Every 12 hours 10/23/23 1613 10/29/23 1209   10/23/23 0000  ceFAZolin (ANCEF) IVPB 1 g/50 mL premix  Status:  Discontinued        1 g 100 mL/hr over 30 Minutes Intravenous Every 8 hours  10/22/23 1856 10/23/23 1613   10/22/23 1715  ceFAZolin (ANCEF) IVPB 2g/100 mL premix        2 g 200 mL/hr over 30 Minutes Intravenous  Once 10/22/23 1705 10/22/23 1806         Subjective: Patient seen and examined.  He has no complaints.  Objective: Vitals:   11/01/23 2340 11/02/23 0321 11/02/23 0738 11/02/23 0816  BP: 132/74 (!) 176/65  137/69  Pulse: 78 81 80   Resp: 18 18 18 19   Temp: 98 F (36.7 C) 98.1 F (36.7 C)  97.6 F (36.4 C)  TempSrc: Oral Oral  Oral  SpO2: 94% 97%    Weight:      Height:        Intake/Output Summary (Last 24 hours) at 11/02/2023 1113 Last data filed at 11/02/2023 0643 Gross per  24 hour  Intake 243 ml  Output 1700 ml  Net -1457 ml   Filed Weights   10/22/23 1435 10/22/23 1930 10/28/23 0820  Weight: 74.8 kg 65 kg 70.3 kg    Examination:  General exam: Appears calm and comfortable  Respiratory system: Clear to auscultation. Respiratory effort normal. Cardiovascular system: S1 & S2 heard, RRR. No JVD, murmurs, rubs, gallops or clicks. No pedal edema. Gastrointestinal system: Abdomen is nondistended, soft and nontender. No organomegaly or masses felt. Normal bowel sounds heard. Central nervous system: Alert and oriented. No focal neurological deficits. Extremities: Right AKA Psychiatry: Judgement and insight appear normal. Mood & affect appropriate.    Data Reviewed: I have personally reviewed following labs and imaging studies  CBC: Recent Labs  Lab 10/28/23 0322 10/29/23 0319 10/31/23 0251  WBC 8.5 12.6* 8.2  NEUTROABS  --   --  5.7  HGB 14.5 14.1 13.2  HCT 45.5 46.0 42.6  MCV 89.6 92.2 91.2  PLT 211 233 210   Basic Metabolic Panel: Recent Labs  Lab 10/28/23 0322 10/29/23 0319 10/31/23 0251 11/02/23 0859  NA 139 137 137 136  K 4.5 5.3* 4.9 5.1  CL 97* 101 103 100  CO2 25 28 30 27   GLUCOSE 105* 215* 152* 158*  BUN 34* 35* 49* 46*  CREATININE 1.47* 1.72* 1.60* 1.41*  CALCIUM 8.6* 8.8* 8.8* 8.9  PHOS  --   --  2.7  --    GFR: Estimated Creatinine Clearance: 38.8 mL/min (A) (by C-G formula based on SCr of 1.41 mg/dL (H)). Liver Function Tests: Recent Labs  Lab 10/31/23 0251  ALBUMIN 2.7*   No results for input(s): "LIPASE", "AMYLASE" in the last 168 hours. No results for input(s): "AMMONIA" in the last 168 hours. Coagulation Profile: No results for input(s): "INR", "PROTIME" in the last 168 hours. Cardiac Enzymes: No results for input(s): "CKTOTAL", "CKMB", "CKMBINDEX", "TROPONINI" in the last 168 hours. BNP (last 3 results) No results for input(s): "PROBNP" in the last 8760 hours. HbA1C: No results for input(s): "HGBA1C" in  the last 72 hours. CBG: Recent Labs  Lab 11/01/23 0551 11/01/23 1159 11/01/23 1620 11/01/23 2108 11/02/23 0616  GLUCAP 98 173* 151* 150* 120*   Lipid Profile: No results for input(s): "CHOL", "HDL", "LDLCALC", "TRIG", "CHOLHDL", "LDLDIRECT" in the last 72 hours. Thyroid Function Tests: No results for input(s): "TSH", "T4TOTAL", "FREET4", "T3FREE", "THYROIDAB" in the last 72 hours. Anemia Panel: No results for input(s): "VITAMINB12", "FOLATE", "FERRITIN", "TIBC", "IRON", "RETICCTPCT" in the last 72 hours. Sepsis Labs: No results for input(s): "PROCALCITON", "LATICACIDVEN" in the last 168 hours.  Recent Results (from the past 240 hours)  Surgical pcr  screen     Status: None   Collection Time: 10/27/23  2:16 PM   Specimen: Nasal Mucosa; Nasal Swab  Result Value Ref Range Status   MRSA, PCR NEGATIVE NEGATIVE Final   Staphylococcus aureus NEGATIVE NEGATIVE Final    Comment: (NOTE) The Xpert SA Assay (FDA approved for NASAL specimens in patients 59 years of age and older), is one component of a comprehensive surveillance program. It is not intended to diagnose infection nor to guide or monitor treatment. Performed at [facility], [address] [place], [place] [address]      Radiology Studies: No results found.  Scheduled Meds:  buPROPion  150 mg Oral Daily   enoxaparin (LOVENOX) injection  40 mg Subcutaneous Q24H   feeding supplement  237 mL Oral BID BM   insulin aspart  0-9 Units Subcutaneous TID WC   metoprolol succinate  25 mg Oral Daily   mometasone-formoterol  2 puff Inhalation BID   multivitamin with minerals  1 tablet Oral Daily   nicotine  21 mg Transdermal Daily   nutrition supplement (JUVEN)  1 packet Oral BID BM   rosuvastatin  20 mg Oral Daily   sodium chloride flush  3 mL Intravenous Q12H   tamsulosin  0.4 mg Oral Daily   umeclidinium bromide  1 puff Inhalation Daily   Continuous Infusions:   LOS: 10 days   Hughie Closs, MD [facility]  11/02/2023, 11:13 AM   *Please note that this is a verbal dictation therefore any spelling or grammatical errors are due to the "Dragon Medical One" system interpretation.  Please page via Amion and do not message via secure chat for urgent patient care matters. Secure chat can be used for non urgent patient care matters.  How to contact the [facility] Attending or Consulting provider 7A - 7P or covering provider during after hours 7P -7A, for this patient?  Check the care team in [facility] and look for a) attending/consulting TRH provider listed and b) the [facility] team listed. Page or secure chat 7A-7P. Log into [email or web address] and use [facility]'s universal password to access. If you do not have the password, please contact the hospital operator. Locate the [facility] provider you are looking for under [facility] and page to a number that you can be directly reached. If you still have difficulty reaching the provider, please page the [facility] (Director on Call) for the Hospitalists listed on amion for assistance.

## 2023-11-02 NOTE — Progress Notes (Signed)
Mobility Specialist Progress Note:   11/02/23 1634  Mobility  Activity Transferred from chair to bed  Level of Assistance Maximum assist, patient does 25-49%  Assistive Device Front wheel walker (+2)  Activity Response Tolerated well  Mobility Referral Yes  Mobility visit 1 Mobility  Mobility Specialist Start Time (ACUTE ONLY) 1620  Mobility Specialist Stop Time (ACUTE ONLY) 1630  Mobility Specialist Time Calculation (min) (ACUTE ONLY) 10 min   Pt received in chair, pleasantly confused. Pt reported seeing "ghost monsters" (some type of bug) flying around room. Agreeable to transfer back to bed, required MaxA+2 to transfer. Tolerated well, asx throughout. Requested to sit EOB. Alarm on, call bell in reach, all needs met.   Feliciana Rossetti Mobility Specialist Please contact via Special educational needs teacher or  Rehab office at [phone]

## 2023-11-02 NOTE — Progress Notes (Signed)
Mobility Specialist Progress Note:    11/02/23 1204  Mobility  Activity Transferred from bed to chair  Level of Assistance Maximum assist, patient does 25-49%  Assistive Device Front wheel walker  RLE Weight Bearing Per Provider Order NWB  Activity Response Tolerated well  Mobility Referral Yes  Mobility visit 1 Mobility  Mobility Specialist Start Time (ACUTE ONLY) 1200  Mobility Specialist Stop Time (ACUTE ONLY) 1204  Mobility Specialist Time Calculation (min) (ACUTE ONLY) 4 min   Pt received in bed, agreeable to transfer B>C via RW. Attempted STS at bedside, required MaxA to stand. Pt unable to pivot or hop with LLE after R AKA. Pt incontinent of stool upon standing. Assisted pt back to EOB to notify nursing staff for peri care assistance. Pt able to stand at bedside again with MaxA +2. Once cleaned, pt transferred to chair. Alarm on, call bell in reach, all needs met.   Feliciana Rossetti Mobility Specialist Please contact via Special educational needs teacher or  Rehab office at [phone]

## 2023-11-02 NOTE — TOC Progression Note (Signed)
Transition of Care [facility]) - Progression Note    Patient Details  Name: Clarence Maldonado MRN: [id number] Date of Birth: Oct 02, 1946  Transition of Care [facility]) CM/SW Contact  Eduard Roux, [place] Phone Number: 11/02/2023, 2:21 PM  Clinical Narrative:     Patient's friend, Clarence Maldonado, has chosen [facility].  [facility] confirmed availability TOC will start insurance authorization for SNF today.  TOC will continue to follow and assist with discharge planning   Antony Blackbird, MSW, LCSW Clinical Social Worker    Expected Discharge Plan: Skilled Nursing Facility Barriers to Discharge: Continued Medical Work up  Expected Discharge Plan and Services                                               Social Determinants of Health (SDOH) Interventions SDOH Screenings   Food Insecurity: No Food Insecurity (10/20/2023)  Housing: Unknown (10/20/2023)  Transportation Needs: No Transportation Needs (10/20/2023)  Utilities: Not At Risk (10/20/2023)  Depression (PHQ2-9): Low Risk  (10/14/2023)  Financial Resource Strain: Low Risk  (03/23/2019)  Physical Activity: Inactive (03/23/2019)  Social Connections: Socially Isolated (03/23/2019)  Stress: No Stress Concern Present (03/23/2019)  Tobacco Use: Medium Risk (10/28/2023)    Readmission Risk Interventions     No data to display

## 2023-11-02 NOTE — TOC Progression Note (Signed)
Transition of Care [facility]) - Progression Note    Patient Details  Name: Clarence Maldonado MRN: [id number] Date of Birth: Apr 01, 1946  Transition of Care [facility]) CM/SW Contact  Eduard Roux, [place] Phone Number: 11/02/2023, 10:24 AM  Clinical Narrative:     CSW received call from patient's friend, Clarence Maldonado- he requested another day to select facility. CSW explain the importance of selecting a SNF today as explain to patient and his care supports. He states understanding, and states he will call CSW back.   TOC will continue to follow and assist with discharge planning.  Antony Blackbird, MSW, LCSW Clinical Social Worker    Expected Discharge Plan: Skilled Nursing Facility Barriers to Discharge: Continued Medical Work up  Expected Discharge Plan and Services                                               Social Determinants of Health (SDOH) Interventions SDOH Screenings   Food Insecurity: No Food Insecurity (10/20/2023)  Housing: Unknown (10/20/2023)  Transportation Needs: No Transportation Needs (10/20/2023)  Utilities: Not At Risk (10/20/2023)  Depression (PHQ2-9): Low Risk  (10/14/2023)  Financial Resource Strain: Low Risk  (03/23/2019)  Physical Activity: Inactive (03/23/2019)  Social Connections: Socially Isolated (03/23/2019)  Stress: No Stress Concern Present (03/23/2019)  Tobacco Use: Medium Risk (10/28/2023)    Readmission Risk Interventions     No data to display

## 2023-11-03 DIAGNOSIS — M25522 Pain in left elbow: Secondary | ICD-10-CM | POA: Diagnosis not present

## 2023-11-03 DIAGNOSIS — F339 Major depressive disorder, recurrent, unspecified: Secondary | ICD-10-CM | POA: Diagnosis not present

## 2023-11-03 DIAGNOSIS — L89316 Pressure-induced deep tissue damage of right buttock: Secondary | ICD-10-CM | POA: Diagnosis not present

## 2023-11-03 DIAGNOSIS — S0990XA Unspecified injury of head, initial encounter: Secondary | ICD-10-CM | POA: Diagnosis not present

## 2023-11-03 DIAGNOSIS — R6883 Chills (without fever): Secondary | ICD-10-CM | POA: Diagnosis not present

## 2023-11-03 DIAGNOSIS — M79642 Pain in left hand: Secondary | ICD-10-CM | POA: Diagnosis not present

## 2023-11-03 DIAGNOSIS — R053 Chronic cough: Secondary | ICD-10-CM | POA: Diagnosis not present

## 2023-11-03 DIAGNOSIS — M79622 Pain in left upper arm: Secondary | ICD-10-CM | POA: Diagnosis not present

## 2023-11-03 DIAGNOSIS — R531 Weakness: Secondary | ICD-10-CM | POA: Diagnosis not present

## 2023-11-03 DIAGNOSIS — N1831 Chronic kidney disease, stage 3a: Secondary | ICD-10-CM | POA: Diagnosis not present

## 2023-11-03 DIAGNOSIS — Z743 Need for continuous supervision: Secondary | ICD-10-CM | POA: Diagnosis not present

## 2023-11-03 DIAGNOSIS — M4802 Spinal stenosis, cervical region: Secondary | ICD-10-CM | POA: Diagnosis not present

## 2023-11-03 DIAGNOSIS — J929 Pleural plaque without asbestos: Secondary | ICD-10-CM | POA: Diagnosis not present

## 2023-11-03 DIAGNOSIS — N189 Chronic kidney disease, unspecified: Secondary | ICD-10-CM | POA: Diagnosis not present

## 2023-11-03 DIAGNOSIS — M19042 Primary osteoarthritis, left hand: Secondary | ICD-10-CM | POA: Diagnosis not present

## 2023-11-03 DIAGNOSIS — J449 Chronic obstructive pulmonary disease, unspecified: Secondary | ICD-10-CM | POA: Diagnosis not present

## 2023-11-03 DIAGNOSIS — M85842 Other specified disorders of bone density and structure, left hand: Secondary | ICD-10-CM | POA: Diagnosis not present

## 2023-11-03 DIAGNOSIS — W19XXXA Unspecified fall, initial encounter: Secondary | ICD-10-CM | POA: Diagnosis not present

## 2023-11-03 DIAGNOSIS — R059 Cough, unspecified: Secondary | ICD-10-CM | POA: Diagnosis not present

## 2023-11-03 DIAGNOSIS — M47812 Spondylosis without myelopathy or radiculopathy, cervical region: Secondary | ICD-10-CM | POA: Diagnosis not present

## 2023-11-03 DIAGNOSIS — E11649 Type 2 diabetes mellitus with hypoglycemia without coma: Secondary | ICD-10-CM | POA: Diagnosis not present

## 2023-11-03 DIAGNOSIS — R079 Chest pain, unspecified: Secondary | ICD-10-CM | POA: Diagnosis not present

## 2023-11-03 DIAGNOSIS — I5020 Unspecified systolic (congestive) heart failure: Secondary | ICD-10-CM | POA: Diagnosis not present

## 2023-11-03 DIAGNOSIS — I639 Cerebral infarction, unspecified: Secondary | ICD-10-CM | POA: Diagnosis not present

## 2023-11-03 DIAGNOSIS — M6281 Muscle weakness (generalized): Secondary | ICD-10-CM | POA: Diagnosis not present

## 2023-11-03 DIAGNOSIS — I6782 Cerebral ischemia: Secondary | ICD-10-CM | POA: Diagnosis not present

## 2023-11-03 DIAGNOSIS — K449 Diaphragmatic hernia without obstruction or gangrene: Secondary | ICD-10-CM | POA: Diagnosis not present

## 2023-11-03 DIAGNOSIS — D649 Anemia, unspecified: Secondary | ICD-10-CM | POA: Diagnosis not present

## 2023-11-03 DIAGNOSIS — I10 Essential (primary) hypertension: Secondary | ICD-10-CM | POA: Diagnosis not present

## 2023-11-03 DIAGNOSIS — J189 Pneumonia, unspecified organism: Secondary | ICD-10-CM | POA: Diagnosis not present

## 2023-11-03 DIAGNOSIS — M25512 Pain in left shoulder: Secondary | ICD-10-CM | POA: Diagnosis not present

## 2023-11-03 DIAGNOSIS — E161 Other hypoglycemia: Secondary | ICD-10-CM | POA: Diagnosis not present

## 2023-11-03 DIAGNOSIS — R4189 Other symptoms and signs involving cognitive functions and awareness: Secondary | ICD-10-CM | POA: Diagnosis not present

## 2023-11-03 DIAGNOSIS — J111 Influenza due to unidentified influenza virus with other respiratory manifestations: Secondary | ICD-10-CM | POA: Diagnosis not present

## 2023-11-03 DIAGNOSIS — E440 Moderate protein-calorie malnutrition: Secondary | ICD-10-CM | POA: Diagnosis not present

## 2023-11-03 DIAGNOSIS — S199XXA Unspecified injury of neck, initial encounter: Secondary | ICD-10-CM | POA: Diagnosis not present

## 2023-11-03 DIAGNOSIS — L0390 Cellulitis, unspecified: Secondary | ICD-10-CM | POA: Diagnosis not present

## 2023-11-03 DIAGNOSIS — N1832 Chronic kidney disease, stage 3b: Secondary | ICD-10-CM | POA: Diagnosis not present

## 2023-11-03 DIAGNOSIS — N400 Enlarged prostate without lower urinary tract symptoms: Secondary | ICD-10-CM | POA: Diagnosis not present

## 2023-11-03 DIAGNOSIS — Z79899 Other long term (current) drug therapy: Secondary | ICD-10-CM | POA: Diagnosis not present

## 2023-11-03 DIAGNOSIS — Z4781 Encounter for orthopedic aftercare following surgical amputation: Secondary | ICD-10-CM | POA: Diagnosis not present

## 2023-11-03 DIAGNOSIS — E1122 Type 2 diabetes mellitus with diabetic chronic kidney disease: Secondary | ICD-10-CM | POA: Diagnosis not present

## 2023-11-03 DIAGNOSIS — E162 Hypoglycemia, unspecified: Secondary | ICD-10-CM | POA: Diagnosis not present

## 2023-11-03 DIAGNOSIS — I129 Hypertensive chronic kidney disease with stage 1 through stage 4 chronic kidney disease, or unspecified chronic kidney disease: Secondary | ICD-10-CM | POA: Diagnosis not present

## 2023-11-03 DIAGNOSIS — J439 Emphysema, unspecified: Secondary | ICD-10-CM | POA: Diagnosis not present

## 2023-11-03 DIAGNOSIS — E119 Type 2 diabetes mellitus without complications: Secondary | ICD-10-CM | POA: Diagnosis not present

## 2023-11-03 DIAGNOSIS — S51012A Laceration without foreign body of left elbow, initial encounter: Secondary | ICD-10-CM | POA: Diagnosis not present

## 2023-11-03 DIAGNOSIS — S61412A Laceration without foreign body of left hand, initial encounter: Secondary | ICD-10-CM | POA: Diagnosis not present

## 2023-11-03 DIAGNOSIS — Z89611 Acquired absence of right leg above knee: Secondary | ICD-10-CM | POA: Diagnosis not present

## 2023-11-03 DIAGNOSIS — L03115 Cellulitis of right lower limb: Secondary | ICD-10-CM | POA: Diagnosis not present

## 2023-11-03 DIAGNOSIS — I771 Stricture of artery: Secondary | ICD-10-CM | POA: Diagnosis not present

## 2023-11-03 DIAGNOSIS — R41841 Cognitive communication deficit: Secondary | ICD-10-CM | POA: Diagnosis not present

## 2023-11-03 DIAGNOSIS — R410 Disorientation, unspecified: Secondary | ICD-10-CM | POA: Diagnosis not present

## 2023-11-03 LAB — GLUCOSE, CAPILLARY
Glucose-Capillary: 135 mg/dL — ABNORMAL HIGH (ref 70–99)
Glucose-Capillary: 165 mg/dL — ABNORMAL HIGH (ref 70–99)

## 2023-11-03 LAB — OVA + PARASITE EXAM

## 2023-11-03 LAB — O&P RESULT

## 2023-11-03 NOTE — Plan of Care (Signed)
Problem: Clinical Measurements: Goal: Ability to maintain clinical measurements within normal limits will improve Outcome: Progressing Goal: Will remain free from infection Outcome: Progressing Goal: Respiratory complications will improve Outcome: Progressing

## 2023-11-03 NOTE — Progress Notes (Signed)
Mobility Specialist Progress Note:   11/03/23 1058  Mobility  Activity Transferred from bed to chair  Level of Assistance Minimal assist, patient does 75% or more  Assistive Device Stedy  RLE Weight Bearing Per Provider Order NWB  Activity Response Tolerated well  Mobility Referral Yes  Mobility visit 1 Mobility  Mobility Specialist Start Time (ACUTE ONLY) 1050  Mobility Specialist Stop Time (ACUTE ONLY) 1058  Mobility Specialist Time Calculation (min) (ACUTE ONLY) 8 min   Pt received in bed, agreeable to transfer B>C via Stedy. Required MinA+2 to stand. Tolerated well, asx throughout. Pt sitting comfortably in chair with call bell in reach, all needs met.    Feliciana Rossetti Mobility Specialist Please contact via Special educational needs teacher or  Rehab office at [phone]

## 2023-11-03 NOTE — Progress Notes (Signed)
Report given to RN of [facility] all the questions were answered  and pt informed him being transfer to [facility] and instructed about the use of retention sock and placed one in rt AKA,pt verbalizes understanding

## 2023-11-03 NOTE — TOC Transition Note (Signed)
Transition of Care [facility]) - Discharge Note   Patient Details  Name: Clarence Maldonado MRN: [id number] Date of Birth: [birth year]  Transition [facility]) CM/SW Contact:  Eduard Roux, LCSW Phone Number: 11/03/2023, 1:53 PM   Clinical Narrative:     Patient will Discharge to: [facility]  Discharge Date: 11/03/23 Family Notified: [name] Transport WG:NFAO  Per MD patient is ready for discharge. RN, patient, and facility notified of discharge. Discharge Summary sent to facility. RN given number for [phone]. Ambulance transport requested for patient.   Clinical Social Worker signing off.  Antony Blackbird, MSW, LCSW Clinical Social Worker     Final next level of care: Skilled Nursing Facility Barriers to Discharge: Barriers Resolved   Patient Goals and CMS Choice            Discharge Placement              Patient chooses bed at: [facility] Patient to be transferred to facility by: [facility] Name of family member notified: friend [name] Patient and family notified of of transfer: 11/03/23  Discharge Plan and Services Additional resources added to the After Visit Summary for                                       Social Drivers of Health (SDOH) Interventions SDOH Screenings   Food Insecurity: No Food Insecurity (10/20/2023)  Housing: Unknown (10/20/2023)  Transportation Needs: No Transportation Needs (10/20/2023)  Utilities: Not At Risk (10/20/2023)  Depression (PHQ2-9): Low Risk  (10/14/2023)  Financial Resource Strain: Low Risk  (03/23/2019)  Physical Activity: Inactive (03/23/2019)  Social Connections: Socially Isolated (03/23/2019)  Stress: No Stress Concern Present (03/23/2019)  Tobacco Use: Medium Risk (10/28/2023)     Readmission Risk Interventions     No data to display

## 2023-11-03 NOTE — Progress Notes (Signed)
Occupational Therapy Treatment Patient Details Name: Clarence Maldonado MRN: [id number] DOB: [birth year] Today's Date: 11/03/2023   History of present illness Pt is a 78 y.o. male admitted 1/17 with RLE cellulitis and limb threatening ischemia. He underwent R AKA 1/23. PMH: CKD 3B, tobacco use disorder, DM2, hyperlipidemia, HTN, depression, BPH   OT comments  Patient seated up in recliner and asking to bathe. Patient making good gains with bathing and dressing with setup for UB sitting and min assist LB sitting with lateral leaning for peri area bathing. Patient able to donn sock on LLE with min assist. Patient will benefit from continued inpatient follow up therapy, <3 hours/day. Acute OT to continue to follow to address established goals to facilitate discharge to next venue of care.       If plan is discharge home, recommend the following:  A lot of help with walking and/or transfers;A lot of help with bathing/dressing/bathroom;Assistance with cooking/housework;Direct supervision/assist for medications management;Direct supervision/assist for financial management;Assist for transportation;Help with stairs or ramp for entrance   Equipment Recommendations  Wheelchair (measurements OT);Wheelchair cushion (measurements OT);BSC/3in1    Recommendations for Other Services      Precautions / Restrictions Precautions Precautions: Fall;Other (comment) Precaution Comments: s/p R AKA Restrictions Weight Bearing Restrictions Per Provider Order: Yes RLE Weight Bearing Per Provider Order: Non weight bearing       Mobility Bed Mobility Overal bed mobility: Needs Assistance             General bed mobility comments: OOB in recliner    Transfers Overall transfer level: Needs assistance                 General transfer comment: not attempted     Balance Overall balance assessment: Needs assistance Sitting-balance support: Feet supported, Single extremity supported Sitting  balance-Leahy Scale: Fair Sitting balance - Comments: sitting unsupport in recliner                                   ADL either performed or assessed with clinical judgement   ADL Overall ADL's : Needs assistance/impaired     Grooming: Wash/dry hands;Wash/dry face;Oral care;Set up;Sitting Grooming Details (indicate cue type and reason): in recliner Upper Body Bathing: Set up;Sitting   Lower Body Bathing: Minimal assistance;Sitting/lateral leans Lower Body Bathing Details (indicate cue type and reason): lateral leaning for peri area bathing Upper Body Dressing : Sitting;Minimal assistance   Lower Body Dressing: Minimal assistance;Sitting/lateral leans Lower Body Dressing Details (indicate cue type and reason): to donn sock on left foot               General ADL Comments: self care performed in sitting    Extremity/Trunk Assessment              Vision       Perception     Praxis      Cognition Arousal: Alert Behavior During Therapy: WFL for tasks assessed/performed Overall Cognitive Status: Impaired/Different from baseline Area of Impairment: Orientation, Attention, Memory, Following commands, Safety/judgement, Awareness, Problem solving                 Orientation Level: Disoriented to, Time Current Attention Level: Sustained Memory: Decreased short-term memory Following Commands: Follows one step commands consistently Safety/Judgement: Decreased awareness of safety, Decreased awareness of deficits Awareness: Emergent Problem Solving: Slow processing, Requires verbal cues, Requires tactile cues  Exercises      Shoulder Instructions       General Comments VSS on RA    Pertinent Vitals/ Pain       Pain Assessment Pain Assessment: Faces Faces Pain Scale: Hurts a little bit Pain Location: R residual limb Pain Descriptors / Indicators: Sore, Grimacing Pain Intervention(s): Monitored during session  Home Living                                           Prior Functioning/Environment              Frequency  Min 1X/week        Progress Toward Goals  OT Goals(current goals can now be found in the care plan section)  Progress towards OT goals: Progressing toward goals  Acute Rehab OT Goals Patient Stated Goal: to go home OT Goal Formulation: With patient Time For Goal Achievement: 11/12/23 Potential to Achieve Goals: Good ADL Goals Pt Will Perform Upper Body Bathing: with set-up;sitting Pt Will Perform Lower Body Bathing: with min assist;sitting/lateral leans Pt Will Transfer to Toilet: with mod assist;squat pivot transfer;bedside commode Additional ADL Goal #1: Pt will demonstrate desensitization techniques with min vc Additional ADL Goal #2: Pt will complete 3 step ADL task inminimally distracting  environment without redirectional cues  Plan      Co-evaluation                 AM-PAC OT "6 Clicks" Daily Activity     Outcome Measure   Help from another person eating meals?: None Help from another person taking care of personal grooming?: A Little Help from another person toileting, which includes using toliet, bedpan, or urinal?: A Lot Help from another person bathing (including washing, rinsing, drying)?: A Lot Help from another person to put on and taking off regular upper body clothing?: A Lot Help from another person to put on and taking off regular lower body clothing?: A Lot 6 Click Score: 15    End of Session    OT Visit Diagnosis: Other abnormalities of gait and mobility (R26.89);Muscle weakness (generalized) (M62.81);Pain Pain - Right/Left: Right Pain - part of body: Leg   Activity Tolerance Patient tolerated treatment well   Patient Left in chair;with call bell/phone within reach;with chair alarm set   Nurse Communication Mobility status        Time: 0981-1914 OT Time Calculation (min): 26 min  Charges: OT General Charges $OT  Visit: 1 Visit OT Treatments $Self Care/Home Management : 23-37 mins  Alfonse Flavors, OTA Acute Rehabilitation Services  Office [phone]   Clarence Maldonado 11/03/2023, 2:09 PM

## 2023-11-03 NOTE — Discharge Summary (Signed)
Physician Discharge Summary  Clarence Maldonado ZOX:[id number] DOB: 11-25-1945 DOA: 10/22/2023  PCP: Octavia Heir, NP  Admit date: 10/22/2023 Discharge date: 11/03/2023 30 Day Unplanned Readmission Risk Score    Flowsheet Row ED to Hosp-Admission (Current) from 10/22/2023 in [facility] 4E CV SURGICAL PROGRESSIVE CARE  30 Day Unplanned Readmission Risk Score (%) 18.19 Filed at 11/03/2023 0801       This score is the patient's risk of an unplanned readmission within 30 days of being discharged (0 -100%). The score is based on dignosis, age, lab data, medications, orders, and past utilization.   Low:  0-14.9   Medium: 15-21.9   High: 22-29.9   Extreme: 30 and above          Admitted From: Home Disposition: SNF  Recommendations for Outpatient Follow-up:  Follow up with PCP in 1-2 weeks Please obtain BMP/CBC in one week Follow-up with vascular surgery in 4 to 6 weeks Please follow up with your PCP on the following pending results: Unresulted Labs (From admission, onward)     Start     Ordered   10/31/23 1123  OVA + PARASITE EXAM  Once,   R        10/31/23 1122              Home Health: None Equipment/Devices: None  Discharge Condition: Stable CODE STATUS: Full code Diet recommendation: Cardiac  Subjective: Seen and examined.  No complaints.  Brief/Interim Summary: Patient is a 78 year old male with past medical history significant for chronic kidney disease stage IIIb, diabetes mellitus, hypertension, hyperlipidemia and BPH.  Patient was admitted with right lower extremity critical limb ischemia and RLE cellulitis in the setting of failed outpatient therapy with doxycycline.  Patient underwent right AKA.  Details below.   R lower ext critical limb ischemia  - s/p AKA (10/28/2023) - Vascular advised to stop IV antibx s/p AKA (source control has been obtained) - Ensure enlive PO bid  - MVI PO daily  - Juven 1 pack PO bid.  Cleared for discharge with recommendations to follow-up  with vascular surgery in 4 to 6 weeks.   HTN  -Blood pressure controlled on Toprol XL 25 mg PO daily and lisinopril. - Hydralazine PRN    HLD  - Crestor 20 mg PO daily    DM2 Resume PTA medications   CKD3b - Stable;monitor    COPD - Albuterol q4 hr PRN  - Dulera 2 puff bid  - Incruse ellipta 1 puff daily    BPH - Flomax 0.4 mg PO daily    Anxiety/depression - Wellbutrin XL 150 mg PO daily. - Ativan 0.5 mg PO bid PRN    Discharge plan was discussed with patient and/or family member and they verbalized understanding and agreed with it.  Discharge Diagnoses:  Principal Problem:   Cellulitis Active Problems:   Cellulitis and abscess of right lower extremity   Malnutrition of moderate degree    Discharge Instructions   Allergies as of 11/03/2023   No Known Allergies      Medication List     STOP taking these medications    lovastatin 20 MG tablet Commonly known as: MEVACOR       TAKE these medications    albuterol 108 (90 Base) MCG/ACT inhaler Commonly known as: VENTOLIN HFA INHALE 1 TO 2 PUFFS BY MOUTH EVERY 4 TO 6 HOURS AS NEEDED FOR BREATHING   buPROPion 150 MG 24 hr tablet Commonly known as: WELLBUTRIN XL TAKE 1 TABLET(150 MG)  BY MOUTH DAILY   dapagliflozin propanediol 5 MG Tabs tablet Commonly known as: Farxiga Take 1 tablet (5 mg total) by mouth daily before breakfast.   ferrous sulfate 325 (65 FE) MG tablet Commonly known as: FeroSul Take 1 tablet (325 mg total) by mouth 2 (two) times daily with a meal.   fluticasone-salmeterol 250-50 MCG/ACT Aepb Commonly known as: ADVAIR Inhale 1 puff into the lungs in the morning and at bedtime.   furosemide 20 MG tablet Commonly known as: LASIX Take 1 tablet (20 mg total) by mouth 3 (three) times a week.   glimepiride 1 MG tablet Commonly known as: AMARYL Take 1 tablet (1 mg total) by mouth daily with breakfast.   HYDROcodone-acetaminophen 5-325 MG tablet Commonly known as:  NORCO/VICODIN Take 1-2 tablets by mouth every 4 (four) hours as needed for moderate pain (pain score 4-6).   lisinopril 5 MG tablet Commonly known as: ZESTRIL Take 1 tablet (5 mg total) by mouth daily.   metoprolol succinate 25 MG 24 hr tablet Commonly known as: TOPROL-XL TAKE 1 TABLET(25 MG) BY MOUTH DAILY   rosuvastatin 20 MG tablet Commonly known as: Crestor Take 1 tablet (20 mg total) by mouth daily.   Spiriva Respimat 1.25 MCG/ACT Aers Generic drug: Tiotropium Bromide Monohydrate Inhale 2 puffs into the lungs daily.   tamsulosin 0.4 MG Caps capsule Commonly known as: FLOMAX Take 1 capsule (0.4 mg total) by mouth daily. Take with food        Follow-up Information     [facility] Follow up in 6 week(s).   Specialty: Vascular Surgery Why: sent Contact information: [address] [place] [address] [phone]        Octavia Heir, NP Follow up in 1 week(s).   Specialty: Adult Health Nurse Practitioner Contact information: [address] [place] [address] [phone]                No Known Allergies  Consultations: Vascular surgery   Procedures/Studies: PERIPHERAL VASCULAR CATHETERIZATION Result Date: 10/25/2023 Images from the original result were not included.   Patient name: Clarence Maldonado       MRN: [id number]        DOB: 20-Jan-1946          Sex: male  10/25/2023 Pre-operative Diagnosis: Critical limb threatening ischemia of right leg with tissue loss Post-operative diagnosis:  Same Surgeon:  Daria Pastures, MD Procedure Performed:  Ultrasound-guided access of left common femoral artery Aortogram and right lower extremity angiogram Second order cannulation of right external iliac artery Mynx closure of left common femoral artery 37 minutes moderate sedation  Indications:  Clarence Maldonado is a 78 year old male with history of stage III CKD, diabetes, hypertension and hyperlipidemia who was  admitted with right leg and great toe wounds with an ABI of 0.43.  Risk and benefits of angiogram with possible intervention were reviewed, patient expressed understanding and elected to proceed.  Findings: Widely patent aorta and bilateral renal arteries.  Approximately 50% stenosis of the right common iliac artery.  Bilateral external iliac arteries with moderate calcific disease although no apparent stenosis. Right common femoral and profunda patent.  There appears to be a short segment of patent SFA although shortly occludes and there is a long segment CTO involving the SFA and popliteal artery which do not reconstitute.  The AT reconstitutes via profunda collaterals and is the only runoff to the foot.  There is very late reconstitution  of the PT.  There is very diminutive filling of the pedal vasculature.             Procedure:  The patient was identified in the holding area and taken to the cath lab  The patient was then placed supine on the table and prepped and draped in the usual sterile fashion.  A time out was called.  Ultrasound was used to evaluate the left common femoral artery.  It was patent .  A digital ultrasound image was acquired.  A micropuncture needle was used to access the left common femoral artery under ultrasound guidance.  An 018 wire was advanced without resistance and a micropuncture sheath was placed.  The 018 wire was removed and a benson wire was placed.  The micropuncture sheath was exchanged for a 5 french sheath.  An omniflush catheter was advanced over the wire to the level of L-1.  An abdominal angiogram was obtained.  Next, using the omniflush catheter and a glide advantage wire, the aortic bifurcation was crossed and the catheter was placed into theright external iliac artery and right runoff was obtained. This demonstrated the above findings.  The Omni Flush catheter was removed and a minx closure device was deployed in the left common femoral artery.  Contrast: 25cc  Sedation: 37 minutes  Impression: Severe infrainguinal occlusive disease with a long segment CTO of the SFA and popliteal.  AT reconstitution although minimal filling of pedal vasculature.   Daria Pastures MD [facility] Office: [phone]    MR FOOT RIGHT W WO CONTRAST Result Date: 10/24/2023 CLINICAL DATA:  Soft tissue infection suspected, foot, xray done EXAM: MRI OF THE RIGHT FOREFOOT WITHOUT AND WITH CONTRAST TECHNIQUE: Multiplanar, multisequence MR imaging of the right forefoot was performed before and after the administration of intravenous contrast. CONTRAST:  6mL GADAVIST GADOBUTROL 1 MMOL/ML IV SOLN COMPARISON:  X-ray 10/22/2023 FINDINGS: Bones/Joint/Cartilage There are multiple sites of increased T2 signal within the bone marrow of the distal forefoot, difficult to determine if these represent sites of true bone marrow edema or artifactually increased signal secondary to poor fat saturation at the edge of the field of view. Involved sites include the distal phalanx of the second toe, third toe, and fourth toe as well as the fifth toe proximal phalanx and adjacent fifth metatarsal head. Faintly increased T2 marrow signal is also present within the distal phalanx of the great toe. Subchondral bone marrow edema within the first metatarsal head is secondary to reactive changes from mild osteoarthritis. There is no evidence of erosion or confluent low T1 marrow signal changes at the above described locations. No fracture or dislocation. No joint effusions. Ligaments Intact Lisfranc ligament.  Intact collateral ligaments. Muscles and Tendons Diffuse edema-like signal of the foot musculature which may represent changes related to denervation and/or myositis. Mild fatty infiltration of the foot musculature. No tenosynovitis. Soft tissues Mild subcutaneous edema over the dorsum of the foot. No organized or rim enhancing fluid collections. IMPRESSION: 1. Multiple sites of  increased T2 signal within the bone marrow of the distal forefoot. It is difficult to determine if these represent sites of true bone marrow edema or artifactually increased signal secondary to poor fat saturation at the edge of the field of view. Involved sites include the distal phalanx of the second toe, third toe, and fourth toe as well as the fifth toe proximal phalanx and adjacent fifth metatarsal head. Faintly increased T2 marrow signal is also present within  the distal phalanx of the great toe. No evidence of erosion or confluent low T1 marrow signal changes at the above described locations to suggest acute osteomyelitis. Reactive osteitis not excluded. Correlate with physical exam. 2. Diffuse edema-like signal of the foot musculature which may represent changes related to denervation and/or myositis. 3. Mild subcutaneous edema over the dorsum of the foot. No organized or rim enhancing fluid collections. Electronically Signed   By: Duanne Guess D.O.   On: 10/24/2023 19:46   MR TIBIA FIBULA RIGHT W WO CONTRAST Result Date: 10/24/2023 CLINICAL DATA:  Soft tissue infection suspected, lower leg, xray done EXAM: MRI OF LOWER RIGHT EXTREMITY WITHOUT AND WITH CONTRAST TECHNIQUE: Multiplanar, multisequence MR imaging of the right lower leg was performed both before and after administration of intravenous contrast. CONTRAST:  6mL GADAVIST GADOBUTROL 1 MMOL/ML IV SOLN COMPARISON:  X-ray right ankle 10/22/2023 FINDINGS: Bones/Joint/Cartilage Right tibia and fibula are intact. No fracture. No malalignment. Small bone infarctions within the distal tibial metaphysis. Mild degenerative changes of the tibiotalar joint, not well assessed at the edge of the field of view. Mild focal subchondral marrow edema at the talar dome. No significant tibiotalar joint effusion. No extra-articular sites of bone marrow edema. No erosion. No marrow replacing bone lesion. Ligaments Not well assessed at the edges of the field of view.  No obvious abnormality. Muscles and Tendons Mild fatty infiltration of the lower leg musculature. Faint perifascial edema tracking along the tibiofibular syndesmosis within the mid to distal lower leg. No perifascial fluid collections. No intramuscular fluid collections. Imaged tendinous structures are intact. No tenosynovitis. Soft tissues Mild circumferential subcutaneous edema of the right lower leg. No organized or rim enhancing fluid collection. IMPRESSION: 1. Mild circumferential subcutaneous edema of the right lower leg, which may represent cellulitis in the appropriate clinical setting. No organized or rim enhancing fluid collection to suggest abscess. 2. Faint perifascial edema tracking along the tibiofibular syndesmosis within the mid to distal lower leg, which may be posttraumatic or reflect a mild nonspecific fasciitis. No deep fascial fluid collections or evidence of significant myositis. 3. No evidence of osteomyelitis. Electronically Signed   By: Duanne Guess D.O.   On: 10/24/2023 19:36   VAS [place] ABI WITH/WO TBI Result Date: 10/24/2023  LOWER EXTREMITY DOPPLER STUDY Patient Name:  NICOLES SEDLACEK  Date of Exam:   10/24/2023 Medical Rec #: [id number]         Accession #:    [id number] Date of Birth: 23-May-1946         Patient Gender: M Patient Age:   33 years Exam Location:  [facility] Procedure:      VAS [place] ABI WITH/WO TBI Referring Phys: Victorino Dike YATES --------------------------------------------------------------------------------  Indications: Rest pain, and ulceration. High Risk Factors: Hypertension, hyperlipidemia, Diabetes, current smoker, prior                    CVA. Other Factors: CKD.  Comparison Study: No previous exams Performing Technologist: Hill, Jody RVT, RDMS  Examination Guidelines: A complete evaluation includes at minimum, Doppler waveform signals and systolic blood pressure reading at the level of bilateral brachial, anterior tibial, and posterior tibial arteries,  when vessel segments are accessible. Bilateral testing is considered an integral part of a complete examination. Photoelectric Plethysmograph (PPG) waveforms and toe systolic pressure readings are included as required and additional duplex testing as needed. Limited examinations for reoccurring indications may be performed as noted.  ABI Findings: +---------+------------------+-----+-------------------+--------+ Right    Rt  Pressure (mmHg)IndexWaveform           Comment  +---------+------------------+-----+-------------------+--------+ Brachial 125                    triphasic                   +---------+------------------+-----+-------------------+--------+ PTA      9                 0.07 dampened monophasic         +---------+------------------+-----+-------------------+--------+ DP       54                0.43 dampened monophasic         +---------+------------------+-----+-------------------+--------+ Great Toe0                 0.00 Absent                      +---------+------------------+-----+-------------------+--------+ +---------+------------------+-----+----------+-------+ Left     Lt Pressure (mmHg)IndexWaveform  Comment +---------+------------------+-----+----------+-------+ Brachial 120                    triphasic         +---------+------------------+-----+----------+-------+ PTA      88                0.70 monophasic        +---------+------------------+-----+----------+-------+ DP       82                0.66 monophasic        +---------+------------------+-----+----------+-------+ Great Toe21                0.17 Abnormal          +---------+------------------+-----+----------+-------+  Summary: Right: Resting right ankle-brachial index indicates severe right lower extremity arterial disease. The right toe-brachial index is absent. Left: Resting left ankle-brachial index indicates moderate left lower extremity arterial disease. The left  toe-brachial index is abnormal. *See table(s) above for measurements and observations.  Suggest Peripheral Vascular Consult. Electronically signed by Sherald Hess MD on 10/24/2023 at 12:29:59 PM.    Final    DG Ankle Complete Right Result Date: 10/22/2023 CLINICAL DATA:  Right lower leg and foot infection EXAM: RIGHT ANKLE - COMPLETE 3+ VIEW; RIGHT FOOT COMPLETE - 3+ VIEW COMPARISON:  10/19/2023 FINDINGS: Right ankle: Frontal, oblique, and lateral views of the right ankle are obtained. No acute displaced fracture, subluxation, or dislocation. Joint spaces are well preserved. Soft tissues are unremarkable. Right foot: Frontal, oblique, and lateral views are obtained. The shallow soft tissue ulceration seen within the plantar aspect of the hindfoot on prior exam is not well visualized on this exam. There is mild subcutaneous edema throughout the right foot, greatest in the forefoot, stable. No subcutaneous gas or radiopaque foreign body. There are no acute or destructive bony abnormalities. Stable hammertoe of the second PIP. Otherwise alignment is anatomic. Mild midfoot osteoarthritis again noted. IMPRESSION: 1. No acute or destructive bony abnormalities involving the right foot or ankle. 2. The shallow soft tissue ulceration of the plantar aspect of the right hindfoot on prior study is not well visualized on this exam. Stable soft tissue edema throughout the right foot, greatest in the forefoot. 3. Stable degenerative changes. Electronically Signed   By: Sharlet Salina M.D.   On: 10/22/2023 16:50   DG Foot Complete Right Result Date: 10/22/2023 CLINICAL DATA:  Right lower leg and foot infection EXAM: RIGHT  ANKLE - COMPLETE 3+ VIEW; RIGHT FOOT COMPLETE - 3+ VIEW COMPARISON:  10/19/2023 FINDINGS: Right ankle: Frontal, oblique, and lateral views of the right ankle are obtained. No acute displaced fracture, subluxation, or dislocation. Joint spaces are well preserved. Soft tissues are unremarkable. Right foot:  Frontal, oblique, and lateral views are obtained. The shallow soft tissue ulceration seen within the plantar aspect of the hindfoot on prior exam is not well visualized on this exam. There is mild subcutaneous edema throughout the right foot, greatest in the forefoot, stable. No subcutaneous gas or radiopaque foreign body. There are no acute or destructive bony abnormalities. Stable hammertoe of the second PIP. Otherwise alignment is anatomic. Mild midfoot osteoarthritis again noted. IMPRESSION: 1. No acute or destructive bony abnormalities involving the right foot or ankle. 2. The shallow soft tissue ulceration of the plantar aspect of the right hindfoot on prior study is not well visualized on this exam. Stable soft tissue edema throughout the right foot, greatest in the forefoot. 3. Stable degenerative changes. Electronically Signed   By: Sharlet Salina M.D.   On: 10/22/2023 16:50   DG Foot Complete Right Result Date: 10/20/2023 CLINICAL DATA:  Diabetic ulcer of right foot for 2 months. EXAM: RIGHT FOOT COMPLETE - 3+ VIEW COMPARISON:  None Available. FINDINGS: There is a shallow concavity of the plantar hindfoot measuring up to approximately 15 mm along the longitudinal axis of the foot and 4 mm in craniocaudal depth. No definite cortical erosion is seen. Mild second PIP hammertoe angulation. Mild degenerative spurring at the lateral base of the proximal phalanx of the second toe. Minimal dorsal midfoot degenerative spurring on lateral view. No acute fracture or dislocation. Moderate atherosclerotic calcifications. IMPRESSION: 1. Shallow concavity of the plantar hindfoot. No definite cortical erosion is seen. 2. Mild second PIP hammertoe angulation. Electronically Signed   By: Neita Garnet M.D.   On: 10/20/2023 23:15     Discharge Exam: Vitals:   11/03/23 0740 11/03/23 0844  BP: (!) 141/76 117/78  Pulse: 77 78  Resp: 19   Temp: 98.2 F (36.8 C)   SpO2: 94%    Vitals:   11/02/23 2325 11/03/23  0400 11/03/23 0740 11/03/23 0844  BP: 130/79 104/66 (!) 141/76 117/78  Pulse:  80 77 78  Resp: 20 20 19    Temp: 98.1 F (36.7 C) 98 F (36.7 C) 98.2 F (36.8 C)   TempSrc: Oral Oral Oral   SpO2: 90% 90% 94%   Weight:      Height:        General: Pt is alert, awake, not in acute distress Cardiovascular: RRR, S1/S2 +, no rubs, no gallops Respiratory: CTA bilaterally, no wheezing, no rhonchi Abdominal: Soft, NT, ND, bowel sounds + Extremities: no edema, no cyanosis, right BKA    The results of significant diagnostics from this hospitalization (including imaging, microbiology, ancillary and laboratory) are listed below for reference.     Microbiology: Recent Results (from the past 240 hours)  Surgical pcr screen     Status: None   Collection Time: 10/27/23  2:16 PM   Specimen: Nasal Mucosa; Nasal Swab  Result Value Ref Range Status   MRSA, PCR NEGATIVE NEGATIVE Final   Staphylococcus aureus NEGATIVE NEGATIVE Final    Comment: (NOTE) The Xpert SA Assay (FDA approved for NASAL specimens in patients 79 years of age and older), is one component of a comprehensive surveillance program. It is not intended to diagnose infection nor to guide or monitor treatment. Performed at [facility]  [facility], [address], [place], [place] [address]      Labs: BNP (last 3 results) No results for input(s): "BNP" in the last 8760 hours. Basic Metabolic Panel: Recent Labs  Lab 10/28/23 0322 10/29/23 0319 10/31/23 0251 11/02/23 0859  NA 139 137 137 136  K 4.5 5.3* 4.9 5.1  CL 97* 101 103 100  CO2 25 28 30 27   GLUCOSE 105* 215* 152* 158*  BUN 34* 35* 49* 46*  CREATININE 1.47* 1.72* 1.60* 1.41*  CALCIUM 8.6* 8.8* 8.8* 8.9  PHOS  --   --  2.7  --    Liver Function Tests: Recent Labs  Lab 10/31/23 0251  ALBUMIN 2.7*   No results for input(s): "LIPASE", "AMYLASE" in the last 168 hours. No results for input(s): "AMMONIA" in the last 168 hours. CBC: Recent Labs  Lab  10/28/23 0322 10/29/23 0319 10/31/23 0251  WBC 8.5 12.6* 8.2  NEUTROABS  --   --  5.7  HGB 14.5 14.1 13.2  HCT 45.5 46.0 42.6  MCV 89.6 92.2 91.2  PLT 211 233 210   Cardiac Enzymes: No results for input(s): "CKTOTAL", "CKMB", "CKMBINDEX", "TROPONINI" in the last 168 hours. BNP: Invalid input(s): "POCBNP" CBG: Recent Labs  Lab 11/02/23 0616 11/02/23 1137 11/02/23 1700 11/02/23 2113 11/03/23 0613  GLUCAP 120* 163* 166* 100* 135*   D-Dimer No results for input(s): "DDIMER" in the last 72 hours. Hgb A1c No results for input(s): "HGBA1C" in the last 72 hours. Lipid Profile No results for input(s): "CHOL", "HDL", "LDLCALC", "TRIG", "CHOLHDL", "LDLDIRECT" in the last 72 hours. Thyroid function studies No results for input(s): "TSH", "T4TOTAL", "T3FREE", "THYROIDAB" in the last 72 hours.  Invalid input(s): "FREET3" Anemia work up No results for input(s): "VITAMINB12", "FOLATE", "FERRITIN", "TIBC", "IRON", "RETICCTPCT" in the last 72 hours. Urinalysis    Component Value Date/Time   COLORURINE YELLOW 09/23/2023 1547   APPEARANCEUR TURBID (A) 09/23/2023 1547   LABSPEC 1.017 09/23/2023 1547   PHURINE 5.5 09/23/2023 1547   GLUCOSEU NEGATIVE 09/23/2023 1547   HGBUR 2+ (A) 09/23/2023 1547   BILIRUBINUR NEGATIVE 05/16/2019 0216   KETONESUR NEGATIVE 09/23/2023 1547   PROTEINUR 2+ (A) 09/23/2023 1547   NITRITE NEGATIVE 09/23/2023 1547   LEUKOCYTESUR 3+ (A) 09/23/2023 1547   Sepsis Labs Recent Labs  Lab 10/28/23 0322 10/29/23 0319 10/31/23 0251  WBC 8.5 12.6* 8.2   Microbiology Recent Results (from the past 240 hours)  Surgical pcr screen     Status: None   Collection Time: 10/27/23  2:16 PM   Specimen: Nasal Mucosa; Nasal Swab  Result Value Ref Range Status   MRSA, PCR NEGATIVE NEGATIVE Final   Staphylococcus aureus NEGATIVE NEGATIVE Final    Comment: (NOTE) The Xpert SA Assay (FDA approved for NASAL specimens in patients 42 years of age and older), is one  component of a comprehensive surveillance program. It is not intended to diagnose infection nor to guide or monitor treatment. Performed at [facility], [address] [place], [place] [address]     FURTHER DISCHARGE INSTRUCTIONS:   Get Medicines reviewed and adjusted: Please take all your medications with you for your next visit with your Primary MD   Laboratory/radiological data: Please request your Primary MD to go over all hospital tests and procedure/radiological results at the follow up, please ask your Primary MD to get all Hospital records sent to his/her office.   In some cases, they will be blood work, cultures and biopsy results pending at  the time of your discharge. Please request that your primary care M.D. goes through all the records of your hospital data and follows up on these results.   Also Note the following: If you experience worsening of your admission symptoms, develop shortness of breath, life threatening emergency, suicidal or homicidal thoughts you must seek medical attention immediately by calling 911 or calling your MD immediately  if symptoms less severe.   You must read complete instructions/literature along with all the possible adverse reactions/side effects for all the Medicines you take and that have been prescribed to you. Take any new Medicines after you have completely understood and accpet all the possible adverse reactions/side effects.    Do not drive when taking Pain medications or sleeping medications (Benzodaizepines)   Do not take more than prescribed Pain, Sleep and Anxiety Medications. It is not advisable to combine anxiety,sleep and pain medications without talking with your primary care practitioner   Special Instructions: If you have smoked or chewed Tobacco  in the last 2 yrs please stop smoking, stop any regular Alcohol  and or any Recreational drug use.   Wear Seat belts while driving.   Please note: You were cared for by a  hospitalist during your hospital stay. Once you are discharged, your primary care physician will handle any further medical issues. Please note that NO REFILLS for any discharge medications will be authorized once you are discharged, as it is imperative that you return to your primary care physician (or establish a relationship with a primary care physician if you do not have one) for your post hospital discharge needs so that they can reassess your need for medications and monitor your lab values  Time coordinating discharge: Over 30 minutes  SIGNED:   Hughie Closs, MD  [facility] 11/03/2023, 9:02 AM *Please note that this is a verbal dictation therefore any spelling or grammatical errors are due to the "Dragon Medical One" system interpretation. If 7PM-7AM, please contact night-coverage [email or web address]

## 2023-11-03 NOTE — Progress Notes (Signed)
  Progress Note    11/03/2023 8:17 AM 6 Days Post-Op  Subjective:  no complaints. Says leg is feeling good   Vitals:   11/03/23 0400 11/03/23 0740  BP: 104/66 (!) 141/76  Pulse: 80 77  Resp: 20 19  Temp: 98 F (36.7 C) 98.2 F (36.8 C)  SpO2: 90% 94%   Physical Exam: Cardiac:  regular Lungs:  non labored Incisions:  right AKA staples intact, clean and dry. Ecchymosis of posterior flap stable Neurologic: alert, somewhat pleasantly confused  CBC    Component Value Date/Time   WBC 8.2 10/31/2023 0251   RBC 4.67 10/31/2023 0251   HGB 13.2 10/31/2023 0251   HCT 42.6 10/31/2023 0251   HCT 32.2 (L) 05/30/2019 1552   PLT 210 10/31/2023 0251   MCV 91.2 10/31/2023 0251   MCH 28.3 10/31/2023 0251   MCHC 31.0 10/31/2023 0251   RDW 14.8 10/31/2023 0251   RDW 14.9 01/07/2015 0959   LYMPHSABS 1.3 10/31/2023 0251   LYMPHSABS 2.1 01/07/2015 0959   MONOABS 0.9 10/31/2023 0251   EOSABS 0.2 10/31/2023 0251   EOSABS 0.3 01/07/2015 0959   BASOSABS 0.1 10/31/2023 0251   BASOSABS 0.1 01/07/2015 0959    BMET    Component Value Date/Time   NA 136 11/02/2023 0859   NA 141 01/08/2016 0905   K 5.1 11/02/2023 0859   CL 100 11/02/2023 0859   CO2 27 11/02/2023 0859   GLUCOSE 158 (H) 11/02/2023 0859   BUN 46 (H) 11/02/2023 0859   BUN 26 01/08/2016 0905   CREATININE 1.41 (H) 11/02/2023 0859   CREATININE 1.93 (H) 09/23/2023 1528   CALCIUM 8.9 11/02/2023 0859   GFRNONAA 52 (L) 11/02/2023 0859   GFRNONAA 46 (L) 06/13/2020 1222   GFRAA 54 (L) 06/13/2020 1222    INR    Component Value Date/Time   INR 1.0 01/26/2020 1525     Intake/Output Summary (Last 24 hours) at 11/03/2023 0817 Last data filed at 11/03/2023 6045 Gross per 24 hour  Intake 240 ml  Output 500 ml  Net -260 ml     Assessment/Plan:  78 y.o. male is s/p Right AKA 6 Days Post-Op   Right AKA intact and dry. Ecchymosis present on posterior flat but improving Apply ACE as needed to help with compression Order  placed for stump stocking He will follow up in 4 weeks for staple removal    Graceann Congress, PA-C [facility] [phone] 11/03/2023 8:17 AM

## 2023-11-03 NOTE — Progress Notes (Signed)
Orthopedic Tech Progress Note Patient Details:  Clarence Maldonado [birth year] [id number]  Called in order to [facility] for a RETENTION SOCK for an AKA   Patient ID: Clarence Maldonado, male   DOB: 13-Jun-1946, 78 y.o.   MRN: [id number]  [name] 11/03/2023, 9:18 AM

## 2023-11-04 DIAGNOSIS — Z4781 Encounter for orthopedic aftercare following surgical amputation: Secondary | ICD-10-CM | POA: Diagnosis not present

## 2023-11-04 DIAGNOSIS — F339 Major depressive disorder, recurrent, unspecified: Secondary | ICD-10-CM | POA: Diagnosis not present

## 2023-11-04 DIAGNOSIS — R531 Weakness: Secondary | ICD-10-CM | POA: Diagnosis not present

## 2023-11-04 DIAGNOSIS — I10 Essential (primary) hypertension: Secondary | ICD-10-CM | POA: Diagnosis not present

## 2023-11-04 DIAGNOSIS — Z89611 Acquired absence of right leg above knee: Secondary | ICD-10-CM | POA: Diagnosis not present

## 2023-11-04 DIAGNOSIS — L89316 Pressure-induced deep tissue damage of right buttock: Secondary | ICD-10-CM | POA: Diagnosis not present

## 2023-11-04 DIAGNOSIS — R053 Chronic cough: Secondary | ICD-10-CM | POA: Diagnosis not present

## 2023-11-04 DIAGNOSIS — J449 Chronic obstructive pulmonary disease, unspecified: Secondary | ICD-10-CM | POA: Diagnosis not present

## 2023-11-04 DIAGNOSIS — E119 Type 2 diabetes mellitus without complications: Secondary | ICD-10-CM | POA: Diagnosis not present

## 2023-11-08 ENCOUNTER — Telehealth: Payer: Self-pay | Admitting: *Deleted

## 2023-11-08 DIAGNOSIS — F339 Major depressive disorder, recurrent, unspecified: Secondary | ICD-10-CM | POA: Diagnosis not present

## 2023-11-08 DIAGNOSIS — J449 Chronic obstructive pulmonary disease, unspecified: Secondary | ICD-10-CM | POA: Diagnosis not present

## 2023-11-08 DIAGNOSIS — L89316 Pressure-induced deep tissue damage of right buttock: Secondary | ICD-10-CM | POA: Diagnosis not present

## 2023-11-08 DIAGNOSIS — E119 Type 2 diabetes mellitus without complications: Secondary | ICD-10-CM | POA: Diagnosis not present

## 2023-11-08 DIAGNOSIS — Z4781 Encounter for orthopedic aftercare following surgical amputation: Secondary | ICD-10-CM | POA: Diagnosis not present

## 2023-11-08 DIAGNOSIS — Z89611 Acquired absence of right leg above knee: Secondary | ICD-10-CM | POA: Diagnosis not present

## 2023-11-08 DIAGNOSIS — R053 Chronic cough: Secondary | ICD-10-CM | POA: Diagnosis not present

## 2023-11-08 DIAGNOSIS — R531 Weakness: Secondary | ICD-10-CM | POA: Diagnosis not present

## 2023-11-08 DIAGNOSIS — I10 Essential (primary) hypertension: Secondary | ICD-10-CM | POA: Diagnosis not present

## 2023-11-08 NOTE — Telephone Encounter (Signed)
Clarence Maldonado stated that he will call the facility and speak with patient's nurse.

## 2023-11-08 NOTE — Telephone Encounter (Signed)
Is [facility] a rehabilitation facility or nursing home? It appears so per [employer]. If so, they should have provider that will see patient. I am unable to follow patient when they are in facilities like this. I would become his provider again when he is discharged. Discharge summary from last hospitalization continued Advair, spiriva and albuterol inhalers.

## 2023-11-08 NOTE — Telephone Encounter (Signed)
Patient's Friend, [name] called and stated that he would like to speak with Hazle Nordmann regarding the patient.  Stated that patient has been transferred to [facility] and they have took away his Inhalers.   [name] is requesting to speak with you directly and wants you to call him at [phone].

## 2023-11-10 DIAGNOSIS — Z89611 Acquired absence of right leg above knee: Secondary | ICD-10-CM | POA: Diagnosis not present

## 2023-11-10 DIAGNOSIS — J449 Chronic obstructive pulmonary disease, unspecified: Secondary | ICD-10-CM | POA: Diagnosis not present

## 2023-11-10 DIAGNOSIS — R531 Weakness: Secondary | ICD-10-CM | POA: Diagnosis not present

## 2023-11-10 DIAGNOSIS — L0390 Cellulitis, unspecified: Secondary | ICD-10-CM | POA: Diagnosis not present

## 2023-11-11 DIAGNOSIS — N1831 Chronic kidney disease, stage 3a: Secondary | ICD-10-CM | POA: Diagnosis not present

## 2023-11-11 DIAGNOSIS — J189 Pneumonia, unspecified organism: Secondary | ICD-10-CM | POA: Diagnosis not present

## 2023-11-11 DIAGNOSIS — R531 Weakness: Secondary | ICD-10-CM | POA: Diagnosis not present

## 2023-11-11 DIAGNOSIS — J449 Chronic obstructive pulmonary disease, unspecified: Secondary | ICD-10-CM | POA: Diagnosis not present

## 2023-11-11 DIAGNOSIS — D649 Anemia, unspecified: Secondary | ICD-10-CM | POA: Diagnosis not present

## 2023-11-15 DIAGNOSIS — F339 Major depressive disorder, recurrent, unspecified: Secondary | ICD-10-CM | POA: Diagnosis not present

## 2023-11-15 DIAGNOSIS — R531 Weakness: Secondary | ICD-10-CM | POA: Diagnosis not present

## 2023-11-15 DIAGNOSIS — E119 Type 2 diabetes mellitus without complications: Secondary | ICD-10-CM | POA: Diagnosis not present

## 2023-11-15 DIAGNOSIS — I10 Essential (primary) hypertension: Secondary | ICD-10-CM | POA: Diagnosis not present

## 2023-11-15 DIAGNOSIS — J111 Influenza due to unidentified influenza virus with other respiratory manifestations: Secondary | ICD-10-CM | POA: Diagnosis not present

## 2023-11-15 DIAGNOSIS — J189 Pneumonia, unspecified organism: Secondary | ICD-10-CM | POA: Diagnosis not present

## 2023-11-15 DIAGNOSIS — L89316 Pressure-induced deep tissue damage of right buttock: Secondary | ICD-10-CM | POA: Diagnosis not present

## 2023-11-15 DIAGNOSIS — R4189 Other symptoms and signs involving cognitive functions and awareness: Secondary | ICD-10-CM | POA: Diagnosis not present

## 2023-11-16 DIAGNOSIS — J189 Pneumonia, unspecified organism: Secondary | ICD-10-CM | POA: Diagnosis not present

## 2023-11-16 DIAGNOSIS — R4189 Other symptoms and signs involving cognitive functions and awareness: Secondary | ICD-10-CM | POA: Diagnosis not present

## 2023-11-16 DIAGNOSIS — J111 Influenza due to unidentified influenza virus with other respiratory manifestations: Secondary | ICD-10-CM | POA: Diagnosis not present

## 2023-11-16 DIAGNOSIS — R531 Weakness: Secondary | ICD-10-CM | POA: Diagnosis not present

## 2023-11-17 DIAGNOSIS — J189 Pneumonia, unspecified organism: Secondary | ICD-10-CM | POA: Diagnosis not present

## 2023-11-17 DIAGNOSIS — R531 Weakness: Secondary | ICD-10-CM | POA: Diagnosis not present

## 2023-11-17 DIAGNOSIS — R4189 Other symptoms and signs involving cognitive functions and awareness: Secondary | ICD-10-CM | POA: Diagnosis not present

## 2023-11-17 DIAGNOSIS — J111 Influenza due to unidentified influenza virus with other respiratory manifestations: Secondary | ICD-10-CM | POA: Diagnosis not present

## 2023-11-18 DIAGNOSIS — R531 Weakness: Secondary | ICD-10-CM | POA: Diagnosis not present

## 2023-11-18 DIAGNOSIS — J111 Influenza due to unidentified influenza virus with other respiratory manifestations: Secondary | ICD-10-CM | POA: Diagnosis not present

## 2023-11-18 DIAGNOSIS — J189 Pneumonia, unspecified organism: Secondary | ICD-10-CM | POA: Diagnosis not present

## 2023-11-18 DIAGNOSIS — R4189 Other symptoms and signs involving cognitive functions and awareness: Secondary | ICD-10-CM | POA: Diagnosis not present

## 2023-11-19 ENCOUNTER — Emergency Department (HOSPITAL_COMMUNITY): Payer: Medicare HMO

## 2023-11-19 ENCOUNTER — Emergency Department (HOSPITAL_COMMUNITY)
Admission: EM | Admit: 2023-11-19 | Discharge: 2023-11-19 | Disposition: A | Discharge status: Rehabilitation Facility | Payer: Medicare HMO | Attending: Emergency Medicine | Admitting: Emergency Medicine

## 2023-11-19 ENCOUNTER — Other Ambulatory Visit: Payer: Self-pay

## 2023-11-19 DIAGNOSIS — I129 Hypertensive chronic kidney disease with stage 1 through stage 4 chronic kidney disease, or unspecified chronic kidney disease: Secondary | ICD-10-CM | POA: Diagnosis not present

## 2023-11-19 DIAGNOSIS — M85842 Other specified disorders of bone density and structure, left hand: Secondary | ICD-10-CM | POA: Diagnosis not present

## 2023-11-19 DIAGNOSIS — I771 Stricture of artery: Secondary | ICD-10-CM | POA: Diagnosis not present

## 2023-11-19 DIAGNOSIS — E11649 Type 2 diabetes mellitus with hypoglycemia without coma: Secondary | ICD-10-CM | POA: Diagnosis not present

## 2023-11-19 DIAGNOSIS — J449 Chronic obstructive pulmonary disease, unspecified: Secondary | ICD-10-CM | POA: Insufficient documentation

## 2023-11-19 DIAGNOSIS — M79622 Pain in left upper arm: Secondary | ICD-10-CM | POA: Diagnosis not present

## 2023-11-19 DIAGNOSIS — M79642 Pain in left hand: Secondary | ICD-10-CM | POA: Diagnosis not present

## 2023-11-19 DIAGNOSIS — M25512 Pain in left shoulder: Secondary | ICD-10-CM | POA: Diagnosis not present

## 2023-11-19 DIAGNOSIS — M4802 Spinal stenosis, cervical region: Secondary | ICD-10-CM | POA: Diagnosis not present

## 2023-11-19 DIAGNOSIS — M25522 Pain in left elbow: Secondary | ICD-10-CM | POA: Diagnosis not present

## 2023-11-19 DIAGNOSIS — N189 Chronic kidney disease, unspecified: Secondary | ICD-10-CM | POA: Diagnosis not present

## 2023-11-19 DIAGNOSIS — E1122 Type 2 diabetes mellitus with diabetic chronic kidney disease: Secondary | ICD-10-CM | POA: Diagnosis not present

## 2023-11-19 DIAGNOSIS — S61412A Laceration without foreign body of left hand, initial encounter: Secondary | ICD-10-CM | POA: Insufficient documentation

## 2023-11-19 DIAGNOSIS — S0990XA Unspecified injury of head, initial encounter: Secondary | ICD-10-CM | POA: Diagnosis not present

## 2023-11-19 DIAGNOSIS — Z79899 Other long term (current) drug therapy: Secondary | ICD-10-CM | POA: Insufficient documentation

## 2023-11-19 DIAGNOSIS — S199XXA Unspecified injury of neck, initial encounter: Secondary | ICD-10-CM | POA: Diagnosis not present

## 2023-11-19 DIAGNOSIS — S51012A Laceration without foreign body of left elbow, initial encounter: Secondary | ICD-10-CM | POA: Insufficient documentation

## 2023-11-19 DIAGNOSIS — W19XXXA Unspecified fall, initial encounter: Secondary | ICD-10-CM | POA: Diagnosis not present

## 2023-11-19 DIAGNOSIS — M19042 Primary osteoarthritis, left hand: Secondary | ICD-10-CM | POA: Diagnosis not present

## 2023-11-19 DIAGNOSIS — E162 Hypoglycemia, unspecified: Secondary | ICD-10-CM | POA: Insufficient documentation

## 2023-11-19 DIAGNOSIS — I10 Essential (primary) hypertension: Secondary | ICD-10-CM | POA: Diagnosis not present

## 2023-11-19 DIAGNOSIS — J929 Pleural plaque without asbestos: Secondary | ICD-10-CM | POA: Diagnosis not present

## 2023-11-19 DIAGNOSIS — J439 Emphysema, unspecified: Secondary | ICD-10-CM | POA: Diagnosis not present

## 2023-11-19 DIAGNOSIS — M47812 Spondylosis without myelopathy or radiculopathy, cervical region: Secondary | ICD-10-CM | POA: Diagnosis not present

## 2023-11-19 DIAGNOSIS — R079 Chest pain, unspecified: Secondary | ICD-10-CM | POA: Diagnosis not present

## 2023-11-19 DIAGNOSIS — I6782 Cerebral ischemia: Secondary | ICD-10-CM | POA: Diagnosis not present

## 2023-11-19 DIAGNOSIS — K449 Diaphragmatic hernia without obstruction or gangrene: Secondary | ICD-10-CM | POA: Diagnosis not present

## 2023-11-19 LAB — COMPREHENSIVE METABOLIC PANEL
ALT: 23 U/L (ref 0–44)
AST: 23 U/L (ref 15–41)
Albumin: 3 g/dL — ABNORMAL LOW (ref 3.5–5.0)
Alkaline Phosphatase: 75 U/L (ref 38–126)
Anion gap: 10 (ref 5–15)
BUN: 36 mg/dL — ABNORMAL HIGH (ref 8–23)
CO2: 29 mmol/L (ref 22–32)
Calcium: 9.2 mg/dL (ref 8.9–10.3)
Chloride: 102 mmol/L (ref 98–111)
Creatinine, Ser: 1.52 mg/dL — ABNORMAL HIGH (ref 0.61–1.24)
GFR, Estimated: 47 mL/min — ABNORMAL LOW (ref >60)
Glucose, Bld: 46 mg/dL — ABNORMAL LOW (ref 70–99)
Potassium: 5.1 mmol/L (ref 3.5–5.1)
Sodium: 141 mmol/L (ref 135–145)
Total Bilirubin: 0.5 mg/dL (ref 0.0–1.2)
Total Protein: 6.9 g/dL (ref 6.5–8.1)

## 2023-11-19 LAB — CBG MONITORING, ED
Glucose-Capillary: 53 mg/dL — ABNORMAL LOW (ref 70–99)
Glucose-Capillary: 115 mg/dL — ABNORMAL HIGH (ref 70–99)
Glucose-Capillary: 94 mg/dL (ref 70–99)

## 2023-11-19 LAB — CBC
HCT: 44.4 % (ref 39.0–52.0)
Hemoglobin: 13.5 g/dL (ref 13.0–17.0)
MCH: 27.9 pg (ref 26.0–34.0)
MCHC: 30.4 g/dL (ref 30.0–36.0)
MCV: 91.7 fL (ref 80.0–100.0)
Platelets: 186 10*3/uL (ref 150–400)
RBC: 4.84 MIL/uL (ref 4.22–5.81)
RDW: 15.4 % (ref 11.5–15.5)
WBC: 8 10*3/uL (ref 4.0–10.5)
nRBC: 0 % (ref 0.0–0.2)

## 2023-11-19 LAB — TROPONIN I (HIGH SENSITIVITY): Troponin I (High Sensitivity): 12 ng/L (ref <18)

## 2023-11-19 MED ORDER — DEXTROSE 50 % IV SOLN
50.0000 mL | Freq: Once | INTRAVENOUS | Status: AC
  Administered 2023-11-19: 50 mL via INTRAVENOUS
  Filled 2023-11-19: qty 50

## 2023-11-19 NOTE — ED Provider Notes (Signed)
[facility] AT [facility] Provider Note   CSN: [id number] Arrival date & time: 11/19/23  4098     History  Chief Complaint  Patient presents with   Clarence Maldonado is a 78 y.o. male.  HPI 78 year old male recent admission for abscess of right lower extremity and had right lower extremity amputation, distal to skilled nursing facility with discharge diagnosis of cellulitis, abscess, my attrition, chronic problems listed are COPD on oxygen, hypertension, hyperlipidemia, type 2 diabetes, CKD stage III presents this morning after being found on the floor at her facility.  They report that initially patient's blood sugar was in the 40s.  He was given p.o. and blood sugar increased to 80s.  He is not able to tell us what happened.  There were no reported witnesses.  He is complaining of some pain in his left upper extremity and has skin tears to the left elbow and left hand.  No obvious trauma to head.  He is not complaining of any head or neck pain.  He is not on any blood thinners. Patient reported to be flu a +4 days ago.  It is not clear whether or not he was having any symptoms    Home Medications Prior to Admission medications   Medication Sig Start Date End Date Taking? Authorizing Provider  albuterol (VENTOLIN HFA) 108 (90 Base) MCG/ACT inhaler INHALE 1 TO 2 PUFFS BY MOUTH EVERY 4 TO 6 HOURS AS NEEDED FOR BREATHING Patient taking differently: Inhale 1-2 puffs into the lungs every 6 (six) hours as needed for shortness of breath or wheezing. INHALE 1 TO 2 PUFFS BY MOUTH EVERY 4 TO 6 HOURS AS NEEDED FOR BREATHING 09/23/23  Yes Fargo, Amy E, NP  atorvastatin (LIPITOR) 40 MG tablet Take 40 mg by mouth daily.   Yes [provider]  buPROPion (WELLBUTRIN XL) 150 MG 24 hr tablet TAKE 1 TABLET(150 MG) BY MOUTH DAILY 05/24/23  Yes Sharon Seller, NP  dapagliflozin propanediol (FARXIGA) 5 MG TABS tablet Take 1 tablet (5 mg total) by mouth daily  before breakfast. 03/17/23  Yes Frederica Kuster, MD  ferrous sulfate (FEROSUL) 325 (65 FE) MG tablet Take 1 tablet (325 mg total) by mouth 2 (two) times daily with a meal. 04/06/23  Yes Frederica Kuster, MD  fluticasone-salmeterol (ADVAIR) 250-50 MCG/ACT AEPB Inhale 1 puff into the lungs in the morning and at bedtime. 09/23/23  Yes Fargo, Amy E, NP  furosemide (LASIX) 20 MG tablet Take 1 tablet (20 mg total) by mouth 3 (three) times a week. 09/24/23  Yes Fargo, Amy E, NP  glimepiride (AMARYL) 1 MG tablet Take 1 tablet (1 mg total) by mouth daily with breakfast. 05/25/23  Yes Fargo, Amy E, NP  HYDROcodone-acetaminophen (NORCO/VICODIN) 5-325 MG tablet Take 1-2 tablets by mouth every 4 (four) hours as needed for moderate pain (pain score 4-6). 11/02/23  Yes Pahwani, Daleen Bo, MD  lisinopril (ZESTRIL) 5 MG tablet Take 1 tablet (5 mg total) by mouth daily. 09/24/23 03/22/24 Yes Fargo, Amy E, NP  metoprolol succinate (TOPROL-XL) 25 MG 24 hr tablet TAKE 1 TABLET(25 MG) BY MOUTH DAILY Patient taking differently: Take 25 mg by mouth daily. 03/18/23  Yes Frederica Kuster, MD  oseltamivir (TAMIFLU) 75 MG capsule Take 75 mg by mouth 2 (two) times daily. For 5 Days - Starting 11/15/23   Yes [provider]  tamsulosin (FLOMAX) 0.4 MG CAPS capsule Take 1 capsule (0.4 mg total)  by mouth daily. Take with food 07/12/23  Yes Fargo, Amy E, NP  Tiotropium Bromide Monohydrate (SPIRIVA RESPIMAT) 1.25 MCG/ACT AERS Inhale 2 puffs into the lungs daily. Patient taking differently: Inhale 2 puffs into the lungs at bedtime. 09/23/23  Yes Fargo, Amy E, NP  rosuvastatin (CRESTOR) 20 MG tablet Take 1 tablet (20 mg total) by mouth daily. Patient not taking: Reported on 11/19/2023 10/26/23 10/25/24  Cephus Shelling, MD      Allergies    Patient has no known allergies.    Review of Systems   Review of Systems  Physical Exam Updated Vital Signs BP 139/71   Pulse 72   Temp 98.1 F (36.7 C) (Oral)   Resp 20   SpO2 100%   Physical Exam Vitals and nursing note reviewed.  Constitutional:      General: He is not in acute distress.    Appearance: Normal appearance.  HENT:     Head: Normocephalic.     Right Ear: External ear normal.     Nose: Nose normal.     Mouth/Throat:     Pharynx: Oropharynx is clear.  Eyes:     Extraocular Movements: Extraocular movements intact.     Pupils: Pupils are equal, round, and reactive to light.  Cardiovascular:     Rate and Rhythm: Normal rate and regular rhythm.     Pulses: Normal pulses.  Pulmonary:     Effort: Pulmonary effort is normal.     Breath sounds: Rhonchi present.  Abdominal:     General: Abdomen is flat. Bowel sounds are normal.     Palpations: Abdomen is soft.  Musculoskeletal:        General: Normal range of motion.     Cervical back: Normal range of motion.     Comments: Right AKA with staples in place with no surrounding erythema, fluctuance, redness, or tenderness noted  Skin:    General: Skin is warm and dry.     Capillary Refill: Capillary refill takes less than 2 seconds.  Neurological:     General: No focal deficit present.     Mental Status: He is alert.  Psychiatric:        Mood and Affect: Mood normal.     ED Results / Procedures / Treatments   Labs (all labs ordered are listed, but only abnormal results are displayed) Labs Reviewed  COMPREHENSIVE METABOLIC PANEL - Abnormal; Notable for the following components:      Result Value   Glucose, Bld 46 (*)    BUN 36 (*)    Creatinine, Ser 1.52 (*)    Albumin 3.0 (*)    GFR, Estimated 47 (*)    All other components within normal limits  CBG MONITORING, ED - Abnormal; Notable for the following components:   Glucose-Capillary 53 (*)    All other components within normal limits  CBG MONITORING, ED - Abnormal; Notable for the following components:   Glucose-Capillary 115 (*)    All other components within normal limits  CBC  CBG MONITORING, ED  TROPONIN I (HIGH SENSITIVITY)     EKG EKG Interpretation Date/Time:  Friday November 19 2023 09:22:02 EST Ventricular Rate:  79 PR Interval:    QRS Duration:  95 QT Interval:  369 QTC Calculation: 423 R Axis:   94  Text Interpretation: Right and left arm electrode reversal, interpretation assumes no reversal Accelerated junctional rhythm Right axis deviation Borderline ST depression, lateral leads  nsst inferior leads-? baseline wander Confirmed by  Margarita Grizzle [phone]) on 11/19/2023 9:45:01 AM  Radiology CT Cervical Spine Wo Contrast Result Date: 11/19/2023 CLINICAL DATA:  Head trauma, fall. EXAM: CT CERVICAL SPINE WITHOUT CONTRAST TECHNIQUE: Multidetector CT imaging of the cervical spine was performed without intravenous contrast. Multiplanar CT image reconstructions were also generated. RADIATION DOSE REDUCTION: This exam was performed according to the departmental dose-optimization program which includes automated exposure control, adjustment of the mA and/or kV according to patient size and/or use of iterative reconstruction technique. COMPARISON:  None Available. FINDINGS: Alignment: There is straightening and slight reversal of the normal cervical lordosis within the lower cervical spine. No listhesis. No facet subluxation or dislocation. Skull base and vertebrae: No acute fracture. No primary bone lesion or focal pathologic process. Soft tissues and spinal canal: No prevertebral fluid or swelling. No visible canal hematoma. Disc levels: Intervertebral disc space narrowing at multiple levels most pronounced at C6-7. Disc osteophyte complex at C5-6 which results in mild osseous spinal canal narrowing. Facet arthrosis at multiple levels. Upper chest: Pleural-parenchymal scarring in the bilateral lung apices. Emphysema. Other: None. IMPRESSION: No acute fracture or traumatic malalignment of the cervical spine. Degenerative changes as above. Emphysema and pleural-parenchymal scarring noted. Electronically Signed   By: Emily Filbert M.D.   On: 11/19/2023 09:59   DG Chest 1 View Result Date: 11/19/2023 CLINICAL DATA:  Pain after fall EXAM: CHEST  1 VIEW COMPARISON:  CT 10/08/2022.  X-Khyle Goodell 07/16/2020 FINDINGS: Hyperinflation with persistent chronic interstitial type changes of the left upper lung. Pleural thickening. No consolidation, pneumothorax or effusion. Known hiatal hernia. Normal cardiopericardial silhouette. Calcified tortuous aorta. Overlapping cardiac leads. If there is further concern of the sequela of trauma additional cross-sectional imaging study can be performed as clinically appropriate IMPRESSION: Hyperinflation with chronic changes.  No pneumothorax or effusion Electronically Signed   By: Karen Kays M.D.   On: 11/19/2023 09:54   DG Hand Complete Left Result Date: 11/19/2023 CLINICAL DATA:  Pain after fall EXAM: LEFT HAND - COMPLETE 3 VIEW COMPARISON:  None Available. FINDINGS: No fracture or dislocation. Minimal osteophyte formation along the interphalangeal joint of the thumb and distal interphalangeal joint of the second digit. Osteopenia. IMPRESSION: Mild degenerative changes.  Osteopenia Electronically Signed   By: Karen Kays M.D.   On: 11/19/2023 09:52   DG Elbow Complete Left Result Date: 11/19/2023 CLINICAL DATA:  Pain after fall EXAM: LEFT ELBOW - COMPLETE 4 VIEW COMPARISON:  None Available. FINDINGS: No fracture or dislocation. Preserved joint spaces. No joint effusion left view. There are well corticated density seen along the margin of the distal humerus more lateral than medial. Possible sequela of remote trauma or accessory ossicles. IMPRESSION: Chronic changes.  No acute osseous abnormality. Electronically Signed   By: Karen Kays M.D.   On: 11/19/2023 09:50   DG Shoulder Left Result Date: 11/19/2023 CLINICAL DATA:  Pain after fall paired EXAM: LEFT SHOULDER - 3 VIEW COMPARISON:  None Available. FINDINGS: Osteopenia. Mild joint space loss of the [facility] joint with osteophytes. Preserved  glenohumeral joint. No fracture or dislocation. IMPRESSION: Osteopenia.  Degenerative changes of the [facility] joint. Electronically Signed   By: Karen Kays M.D.   On: 11/19/2023 09:49   CT Head Wo Contrast Result Date: 11/19/2023 CLINICAL DATA:  Head trauma, fall. EXAM: CT HEAD WITHOUT CONTRAST TECHNIQUE: Contiguous axial images were obtained from the base of the skull through the vertex without intravenous contrast. RADIATION DOSE REDUCTION: This exam was performed according to the departmental dose-optimization program which includes automated  exposure control, adjustment of the mA and/or kV according to patient size and/or use of iterative reconstruction technique. COMPARISON:  CT head 05/17/2019 FINDINGS: Brain: No acute intracranial hemorrhage. No CT evidence of acute infarct. Remote infarct in the left parietal lobe involving the postcentral gyrus which is new since 2020. Additional small remote transcortical infarct in the medial left occipital lobe. Additional remote lacunar infarct involving the left ventral medial thalamus. Hypoattenuation in the periventricular and subcortical white matter suggestive of moderate chronic microvascular ischemic changes. No edema, mass effect, or midline shift. The basilar cisterns are patent. Ventricles: Prominence of the ventricles suggesting underlying parenchymal volume loss. Vascular: No hyperdense vessel. Atherosclerosis noted involving the bilateral intracranial carotid arteries and intracranial vertebral arteries. Skull: No acute or aggressive finding. Sinuses/orbits: The visualized paranasal sinuses are clear. Orbits are symmetric. Other: Mastoid air cells are clear. IMPRESSION: No CT evidence of acute intracranial abnormality. Remote infarct involving the left postcentral gyrus. Additional remote transcortical infarct in the left occipital lobe and remote lacunar infarct in the left thalamus. Moderate chronic microvascular ischemic changes and parenchymal volume  loss. Electronically Signed   By: Emily Filbert M.D.   On: 11/19/2023 09:36    Procedures .Critical Care  Performed by: Margarita Grizzle, MD Authorized by: Margarita Grizzle, MD   Critical care provider statement:    Critical care time (minutes):  60   Critical care end time:  11/19/2023 12:47 PM   Critical care was necessary to treat or prevent imminent or life-threatening deterioration of the following conditions:  CNS failure or compromise   Critical care was time spent personally by me on the following activities:  Development of treatment plan with patient or surrogate, discussions with consultants, evaluation of patient's response to treatment, examination of patient, ordering and review of laboratory studies, ordering and review of radiographic studies, ordering and performing treatments and interventions, pulse oximetry, re-evaluation of patient's condition and review of old charts     Medications Ordered in ED Medications  dextrose 50 % solution 50 mL (50 mLs Intravenous Given 11/19/23 1026)    ED Course/ Medical Decision Making/ A&P Clinical Course as of 11/19/23 1247  Fri Nov 19, 2023  1610 Shoulder x-Jerric Oyen reviewed interpreted no evidence of acute abnormality noted on my interpretation awaiting radiologist interpretation [DR]  [phone] Left elbow x-Markea Ruzich reviewed interpreted no evidence of acute acute fracture noted although small foreign body consistent with bony island noted that does not appear to be in joint.  Awaiting radiologist interpretation [DR]  [phone] Left hand x-Jeanmarie Mccowen reviewed interpreted no evidence of acute Gershon Mussel noted awaiting radiologist interpretation [DR]  [phone] Chest x-Sulayman Manning reviewed interpreted no evidence of acute abnormality noted on my interpretation awaiting radiologist interpretation [DR]  1033 Radiologist interpretation notes no evidence of acute intracranial abnormality [DR]  1034 Radiologist notes no acute abnormality in the left shoulder [DR]  1034 Radiologist notes  no acute abnormality in the left hand or left elbow [DR]    Clinical Course User Index [DR] Margarita Grizzle, MD                                 Medical Decision Making Amount and/or Complexity of Data Reviewed Labs: ordered. Radiology: ordered.  Risk Prescription drug management.   78 yo male with recent aka, abscess rle presents from facility found on ground patient unable to give complete history- he does not remember what happened until he was found on the  ground.  He reports some pain in his left upper extremity. It was noted by the facility that the patient's blood sugar was low.  Was given p.o. and his blood sugar improved Differential diagnosis of his unwitnessed fall include but is not limited to hypoglycemia which appears most probable in this case, cardiac syncope, fall with head injury, other metabolic etiologies such as hypo or hypernatremia, other electrolyte abnormalities, infection with encephalopathy.  Based on history appears that hypoglycemia is most likely.  However, labs are pending to help quantify etiology Patient appears to have some injuries from the fall mainly on the left upper extremity with skin tear over the left elbow and left hand.  He is able to move his upper extremity.  He has no obvious trauma to his head but head trauma is considered in differential.  Hence, neck is included. Plan CT of head and neck, imaging of left upper extremity, labs Patient evaluated here in the ED after being found on ground.  Patient had trauma to left upper extremity.  Evaluated for his trauma with imaging of head, neck, shoulder, elbow, hand without any evidence of acute traumatic injury. Patient evaluated regarding acute metabolic abnormalities with complete metabolic panel.  Normal sodium, potassium, chloride.  Renal function shows some elevated creatinine but stable from prior no other acute abnormalities are noted CBC is normal Troponin is normal Patient was hypoglycemic on  initial evaluation.  Patient has had repeat event of hypoglycemia here in the ED.  Patient is on glimepiride which should have peak accident 2 to 3 hours but can last for 24 hours.  We will give another dose of p.o. and observe.  If patient continues to have hyperglycemia, may require admission.  If patient is able to maintain his blood sugar will likely be candidate for discharge back to facility  12:47 PM Repeat blood sugar now 94. Patient appears stable for discharge to facility. Patient is awake and alert.  We have discussed return precautions and need for follow-up        Final Clinical Impression(s) / ED Diagnoses Final diagnoses:  Fall, initial encounter  Hypoglycemia    Rx / DC Orders ED Discharge Orders          Ordered    POCT glucose (manual entry)        11/19/23 1114              Margarita Grizzle, MD 11/19/23 1248

## 2023-11-19 NOTE — ED Notes (Signed)
[facility] Called - ETA 2+ hours, pt to [facility]

## 2023-11-19 NOTE — Discharge Instructions (Signed)
You were evaluated here in the emergency department today for fall, altered mental status, and low blood sugar.  Based on your evaluation here it is likely that your fall was due to low blood sugar.  You should have your blood sugar monitored every hour and make sure you are continuing to take things by mouth frequently today. Return to the emergency department if you are having any worsening symptoms.  Please contact your doctor for further guidance regarding her medications.

## 2023-11-19 NOTE — ED Notes (Signed)
Food provided, [facility] called.Marland KitchenMarland Kitchen

## 2023-11-19 NOTE — ED Notes (Signed)
Pt transported to xray

## 2023-11-19 NOTE — ED Triage Notes (Signed)
Pt BIB [facility] from [facility] after being found on floor by staff. Pt has hx of diabetes and cbg when found was 44 per facility and pt was confused. No obvious injury or deformity per EMS. Pt also has hx COPD and is on 2L [place] at baseline.

## 2023-11-22 DIAGNOSIS — R4189 Other symptoms and signs involving cognitive functions and awareness: Secondary | ICD-10-CM | POA: Diagnosis not present

## 2023-11-22 DIAGNOSIS — E11649 Type 2 diabetes mellitus with hypoglycemia without coma: Secondary | ICD-10-CM | POA: Diagnosis not present

## 2023-11-22 DIAGNOSIS — R531 Weakness: Secondary | ICD-10-CM | POA: Diagnosis not present

## 2023-11-22 DIAGNOSIS — I10 Essential (primary) hypertension: Secondary | ICD-10-CM | POA: Diagnosis not present

## 2023-11-22 DIAGNOSIS — E119 Type 2 diabetes mellitus without complications: Secondary | ICD-10-CM | POA: Diagnosis not present

## 2023-11-22 DIAGNOSIS — J449 Chronic obstructive pulmonary disease, unspecified: Secondary | ICD-10-CM | POA: Diagnosis not present

## 2023-11-22 DIAGNOSIS — S51012A Laceration without foreign body of left elbow, initial encounter: Secondary | ICD-10-CM | POA: Diagnosis not present

## 2023-11-22 DIAGNOSIS — S61412A Laceration without foreign body of left hand, initial encounter: Secondary | ICD-10-CM | POA: Diagnosis not present

## 2023-11-22 DIAGNOSIS — J111 Influenza due to unidentified influenza virus with other respiratory manifestations: Secondary | ICD-10-CM | POA: Diagnosis not present

## 2023-11-22 DIAGNOSIS — W19XXXA Unspecified fall, initial encounter: Secondary | ICD-10-CM | POA: Diagnosis not present

## 2023-11-22 DIAGNOSIS — F339 Major depressive disorder, recurrent, unspecified: Secondary | ICD-10-CM | POA: Diagnosis not present

## 2023-11-22 DIAGNOSIS — J189 Pneumonia, unspecified organism: Secondary | ICD-10-CM | POA: Diagnosis not present

## 2023-11-24 DIAGNOSIS — N390 Urinary tract infection, site not specified: Secondary | ICD-10-CM | POA: Diagnosis not present

## 2023-11-24 DIAGNOSIS — E119 Type 2 diabetes mellitus without complications: Secondary | ICD-10-CM | POA: Diagnosis not present

## 2023-11-25 DIAGNOSIS — E11649 Type 2 diabetes mellitus with hypoglycemia without coma: Secondary | ICD-10-CM | POA: Diagnosis not present

## 2023-11-25 DIAGNOSIS — N1832 Chronic kidney disease, stage 3b: Secondary | ICD-10-CM | POA: Diagnosis not present

## 2023-11-25 DIAGNOSIS — D649 Anemia, unspecified: Secondary | ICD-10-CM | POA: Diagnosis not present

## 2023-11-25 DIAGNOSIS — Z9181 History of falling: Secondary | ICD-10-CM | POA: Diagnosis not present

## 2023-11-25 DIAGNOSIS — R4189 Other symptoms and signs involving cognitive functions and awareness: Secondary | ICD-10-CM | POA: Diagnosis not present

## 2023-11-30 DIAGNOSIS — I10 Essential (primary) hypertension: Secondary | ICD-10-CM | POA: Diagnosis not present

## 2023-11-30 DIAGNOSIS — F339 Major depressive disorder, recurrent, unspecified: Secondary | ICD-10-CM | POA: Diagnosis not present

## 2023-11-30 DIAGNOSIS — E119 Type 2 diabetes mellitus without complications: Secondary | ICD-10-CM | POA: Diagnosis not present

## 2023-11-30 DIAGNOSIS — J449 Chronic obstructive pulmonary disease, unspecified: Secondary | ICD-10-CM | POA: Diagnosis not present

## 2023-12-06 ENCOUNTER — Telehealth: Payer: Self-pay | Admitting: *Deleted

## 2023-12-06 DIAGNOSIS — E119 Type 2 diabetes mellitus without complications: Secondary | ICD-10-CM | POA: Diagnosis not present

## 2023-12-06 DIAGNOSIS — J449 Chronic obstructive pulmonary disease, unspecified: Secondary | ICD-10-CM | POA: Diagnosis not present

## 2023-12-06 DIAGNOSIS — I10 Essential (primary) hypertension: Secondary | ICD-10-CM | POA: Diagnosis not present

## 2023-12-06 DIAGNOSIS — F339 Major depressive disorder, recurrent, unspecified: Secondary | ICD-10-CM | POA: Diagnosis not present

## 2023-12-06 NOTE — Telephone Encounter (Signed)
 [name], Caregiver called and stated that patient does not read or write. [employer] sent him a letter stating that Provider needs to write a letter for SNF. Patient is currently at [facility] on [address]. Patient has been there and They billed him over $17000  I told Caregiver that he needed to take the letter from [facility] to the Social Worker at the facility.   [name] is wanting to speak with Amy Directly, wants her to call him at #[phone]

## 2023-12-06 NOTE — Telephone Encounter (Signed)
 I attempted to call twice but no answer. I have not seen patient since before hospitalization. At this time, the doctor who works at [facility] is considered his primary doctor. Recommend talking with Child psychotherapist and nursing at [facility] on how to contact facility doctor so letter can be drafted.

## 2023-12-06 NOTE — Telephone Encounter (Signed)
 Tried calling Caregiver, No Answer.

## 2023-12-07 DIAGNOSIS — R41841 Cognitive communication deficit: Secondary | ICD-10-CM | POA: Diagnosis not present

## 2023-12-07 DIAGNOSIS — I639 Cerebral infarction, unspecified: Secondary | ICD-10-CM | POA: Diagnosis not present

## 2023-12-08 DIAGNOSIS — R638 Other symptoms and signs concerning food and fluid intake: Secondary | ICD-10-CM | POA: Diagnosis not present

## 2023-12-08 DIAGNOSIS — J189 Pneumonia, unspecified organism: Secondary | ICD-10-CM | POA: Diagnosis not present

## 2023-12-08 DIAGNOSIS — R531 Weakness: Secondary | ICD-10-CM | POA: Diagnosis not present

## 2023-12-08 DIAGNOSIS — R41841 Cognitive communication deficit: Secondary | ICD-10-CM | POA: Diagnosis not present

## 2023-12-08 DIAGNOSIS — R4189 Other symptoms and signs involving cognitive functions and awareness: Secondary | ICD-10-CM | POA: Diagnosis not present

## 2023-12-08 DIAGNOSIS — I639 Cerebral infarction, unspecified: Secondary | ICD-10-CM | POA: Diagnosis not present

## 2023-12-09 DIAGNOSIS — I639 Cerebral infarction, unspecified: Secondary | ICD-10-CM | POA: Diagnosis not present

## 2023-12-09 DIAGNOSIS — R531 Weakness: Secondary | ICD-10-CM | POA: Diagnosis not present

## 2023-12-09 DIAGNOSIS — R4189 Other symptoms and signs involving cognitive functions and awareness: Secondary | ICD-10-CM | POA: Diagnosis not present

## 2023-12-09 DIAGNOSIS — R41841 Cognitive communication deficit: Secondary | ICD-10-CM | POA: Diagnosis not present

## 2023-12-09 DIAGNOSIS — J189 Pneumonia, unspecified organism: Secondary | ICD-10-CM | POA: Diagnosis not present

## 2023-12-09 DIAGNOSIS — I10 Essential (primary) hypertension: Secondary | ICD-10-CM | POA: Diagnosis not present

## 2023-12-10 DIAGNOSIS — R41841 Cognitive communication deficit: Secondary | ICD-10-CM | POA: Diagnosis not present

## 2023-12-10 DIAGNOSIS — I639 Cerebral infarction, unspecified: Secondary | ICD-10-CM | POA: Diagnosis not present

## 2023-12-13 ENCOUNTER — Ambulatory Visit (INDEPENDENT_AMBULATORY_CARE_PROVIDER_SITE_OTHER): Payer: Medicare HMO | Admitting: Physician Assistant

## 2023-12-13 ENCOUNTER — Encounter: Payer: Self-pay | Admitting: Physician Assistant

## 2023-12-13 VITALS — BP 86/56 | HR 88 | Temp 97.3°F | Resp 22 | Ht 65.0 in

## 2023-12-13 DIAGNOSIS — I739 Peripheral vascular disease, unspecified: Secondary | ICD-10-CM

## 2023-12-13 DIAGNOSIS — F331 Major depressive disorder, recurrent, moderate: Secondary | ICD-10-CM | POA: Diagnosis not present

## 2023-12-13 DIAGNOSIS — I639 Cerebral infarction, unspecified: Secondary | ICD-10-CM | POA: Diagnosis not present

## 2023-12-13 DIAGNOSIS — R41841 Cognitive communication deficit: Secondary | ICD-10-CM | POA: Diagnosis not present

## 2023-12-13 NOTE — Progress Notes (Signed)
 POST OPERATIVE OFFICE NOTE    CC:  F/u for surgery  HPI:  This is a 78 y.o. male who is s/p right AKA due to right LE non healing wounds PAD without revascularization options on 10/28/23 by Dr. Myra Gianotti.    Pt returns today for follow up.  Pt states He is doing well over all.  He is looking forward to going home soon from the SNF facility.  He denies pain in the right AKA stump and no fever or chills.   Stump appears viable.   He is medically  managed on a Statin.     No Known Allergies  Current Outpatient Medications  Medication Sig Dispense Refill   albuterol (VENTOLIN HFA) 108 (90 Base) MCG/ACT inhaler INHALE 1 TO 2 PUFFS BY MOUTH EVERY 4 TO 6 HOURS AS NEEDED FOR BREATHING (Patient taking differently: Inhale 1-2 puffs into the lungs every 6 (six) hours as needed for shortness of breath or wheezing. INHALE 1 TO 2 PUFFS BY MOUTH EVERY 4 TO 6 HOURS AS NEEDED FOR BREATHING) 17 each 11   atorvastatin (LIPITOR) 40 MG tablet Take 40 mg by mouth daily.     buPROPion (WELLBUTRIN XL) 150 MG 24 hr tablet TAKE 1 TABLET(150 MG) BY MOUTH DAILY 90 tablet 1   dapagliflozin propanediol (FARXIGA) 5 MG TABS tablet Take 1 tablet (5 mg total) by mouth daily before breakfast. 90 tablet 3   ferrous sulfate (FEROSUL) 325 (65 FE) MG tablet Take 1 tablet (325 mg total) by mouth 2 (two) times daily with a meal. 60 tablet 5   fluticasone-salmeterol (ADVAIR) 250-50 MCG/ACT AEPB Inhale 1 puff into the lungs in the morning and at bedtime. 60 each 5   furosemide (LASIX) 20 MG tablet Take 1 tablet (20 mg total) by mouth 3 (three) times a week. 30 tablet 3   glimepiride (AMARYL) 1 MG tablet Take 1 tablet (1 mg total) by mouth daily with breakfast. 90 tablet 1   HYDROcodone-acetaminophen (NORCO/VICODIN) 5-325 MG tablet Take 1-2 tablets by mouth every 4 (four) hours as needed for moderate pain (pain score 4-6). 20 tablet 0   lisinopril (ZESTRIL) 5 MG tablet Take 1 tablet (5 mg total) by mouth daily. 90 tablet 1    metoprolol succinate (TOPROL-XL) 25 MG 24 hr tablet TAKE 1 TABLET(25 MG) BY MOUTH DAILY (Patient taking differently: Take 25 mg by mouth daily.) 90 tablet 1   oseltamivir (TAMIFLU) 75 MG capsule Take 75 mg by mouth 2 (two) times daily. For 5 Days - Starting 11/15/23     rosuvastatin (CRESTOR) 20 MG tablet Take 1 tablet (20 mg total) by mouth daily. (Patient not taking: Reported on 11/19/2023) 30 tablet 11   tamsulosin (FLOMAX) 0.4 MG CAPS capsule Take 1 capsule (0.4 mg total) by mouth daily. Take with food 90 capsule 1   Tiotropium Bromide Monohydrate (SPIRIVA RESPIMAT) 1.25 MCG/ACT AERS Inhale 2 puffs into the lungs daily. (Patient taking differently: Inhale 2 puffs into the lungs at bedtime.) 1 each 2   No current facility-administered medications for this visit.     ROS:  See HPI  Physical Exam:    Incision:  well healed  Extremities:  warm without erythema on the AKA, left LE warm to touch no edema of non healing wounds.   Lungs non labored breathing     Assessment/Plan:  This is a 78 y.o. male who is s/p:right AKA without revascularization options based on the Right common femoral and profunda patent. There appears to be  a short segment of patent SFA although shortly occludes and there is a long segment CTO involving the SFA and popliteal artery which do not reconstitute. The AT reconstitutes via profunda collaterals and is the only runoff to the foot. There is very late reconstitution of the PT. There is very diminutive filling of the pedal vasculature.   Staples were removed today and he tolerated this well.  He will f/u in 6 month for left LE surveillance ABI.  If he develops ischemic changes to the left LE such as non healing wound or rest pain he should call our office.  Currently the left LE is asymptomatic.     Mosetta Pigeon PA-C [facility] [phone]   Clinic MD:  Myra Gianotti

## 2023-12-14 DIAGNOSIS — R41841 Cognitive communication deficit: Secondary | ICD-10-CM | POA: Diagnosis not present

## 2023-12-14 DIAGNOSIS — R4189 Other symptoms and signs involving cognitive functions and awareness: Secondary | ICD-10-CM | POA: Diagnosis not present

## 2023-12-14 DIAGNOSIS — J189 Pneumonia, unspecified organism: Secondary | ICD-10-CM | POA: Diagnosis not present

## 2023-12-14 DIAGNOSIS — R531 Weakness: Secondary | ICD-10-CM | POA: Diagnosis not present

## 2023-12-14 DIAGNOSIS — I639 Cerebral infarction, unspecified: Secondary | ICD-10-CM | POA: Diagnosis not present

## 2023-12-15 DIAGNOSIS — R41841 Cognitive communication deficit: Secondary | ICD-10-CM | POA: Diagnosis not present

## 2023-12-15 DIAGNOSIS — I639 Cerebral infarction, unspecified: Secondary | ICD-10-CM | POA: Diagnosis not present

## 2023-12-16 ENCOUNTER — Other Ambulatory Visit: Payer: Self-pay | Admitting: *Deleted

## 2023-12-16 DIAGNOSIS — R41841 Cognitive communication deficit: Secondary | ICD-10-CM | POA: Diagnosis not present

## 2023-12-16 DIAGNOSIS — I639 Cerebral infarction, unspecified: Secondary | ICD-10-CM | POA: Diagnosis not present

## 2023-12-16 DIAGNOSIS — I70238 Atherosclerosis of native arteries of right leg with ulceration of other part of lower right leg: Secondary | ICD-10-CM

## 2023-12-16 DIAGNOSIS — I739 Peripheral vascular disease, unspecified: Secondary | ICD-10-CM

## 2023-12-17 DIAGNOSIS — I639 Cerebral infarction, unspecified: Secondary | ICD-10-CM | POA: Diagnosis not present

## 2023-12-17 DIAGNOSIS — R41841 Cognitive communication deficit: Secondary | ICD-10-CM | POA: Diagnosis not present

## 2023-12-20 DIAGNOSIS — I639 Cerebral infarction, unspecified: Secondary | ICD-10-CM | POA: Diagnosis not present

## 2023-12-20 DIAGNOSIS — F331 Major depressive disorder, recurrent, moderate: Secondary | ICD-10-CM | POA: Diagnosis not present

## 2023-12-20 DIAGNOSIS — R41841 Cognitive communication deficit: Secondary | ICD-10-CM | POA: Diagnosis not present

## 2023-12-21 DIAGNOSIS — F339 Major depressive disorder, recurrent, unspecified: Secondary | ICD-10-CM | POA: Diagnosis not present

## 2023-12-21 DIAGNOSIS — E119 Type 2 diabetes mellitus without complications: Secondary | ICD-10-CM | POA: Diagnosis not present

## 2023-12-21 DIAGNOSIS — I10 Essential (primary) hypertension: Secondary | ICD-10-CM | POA: Diagnosis not present

## 2023-12-21 DIAGNOSIS — J449 Chronic obstructive pulmonary disease, unspecified: Secondary | ICD-10-CM | POA: Diagnosis not present

## 2023-12-23 ENCOUNTER — Encounter: Payer: Medicare HMO | Admitting: Orthopedic Surgery

## 2023-12-23 DIAGNOSIS — R41841 Cognitive communication deficit: Secondary | ICD-10-CM | POA: Diagnosis not present

## 2023-12-23 DIAGNOSIS — G894 Chronic pain syndrome: Secondary | ICD-10-CM | POA: Diagnosis not present

## 2023-12-23 DIAGNOSIS — F331 Major depressive disorder, recurrent, moderate: Secondary | ICD-10-CM | POA: Diagnosis not present

## 2023-12-23 DIAGNOSIS — F411 Generalized anxiety disorder: Secondary | ICD-10-CM | POA: Diagnosis not present

## 2023-12-24 NOTE — Progress Notes (Signed)
 This encounter was created in error - please disregard.

## 2023-12-26 DIAGNOSIS — Z7189 Other specified counseling: Secondary | ICD-10-CM | POA: Diagnosis not present

## 2023-12-26 DIAGNOSIS — R4189 Other symptoms and signs involving cognitive functions and awareness: Secondary | ICD-10-CM | POA: Diagnosis not present

## 2023-12-26 DIAGNOSIS — R531 Weakness: Secondary | ICD-10-CM | POA: Diagnosis not present

## 2023-12-26 DIAGNOSIS — Z79899 Other long term (current) drug therapy: Secondary | ICD-10-CM | POA: Diagnosis not present

## 2024-01-07 DIAGNOSIS — R531 Weakness: Secondary | ICD-10-CM | POA: Diagnosis not present

## 2024-01-07 DIAGNOSIS — I959 Hypotension, unspecified: Secondary | ICD-10-CM | POA: Diagnosis not present

## 2024-01-07 DIAGNOSIS — R4189 Other symptoms and signs involving cognitive functions and awareness: Secondary | ICD-10-CM | POA: Diagnosis not present

## 2024-01-09 DIAGNOSIS — Z87898 Personal history of other specified conditions: Secondary | ICD-10-CM | POA: Diagnosis not present

## 2024-01-09 DIAGNOSIS — R4189 Other symptoms and signs involving cognitive functions and awareness: Secondary | ICD-10-CM | POA: Diagnosis not present

## 2024-01-09 DIAGNOSIS — R739 Hyperglycemia, unspecified: Secondary | ICD-10-CM | POA: Diagnosis not present

## 2024-01-09 DIAGNOSIS — R531 Weakness: Secondary | ICD-10-CM | POA: Diagnosis not present

## 2024-01-12 DIAGNOSIS — E119 Type 2 diabetes mellitus without complications: Secondary | ICD-10-CM | POA: Diagnosis not present

## 2024-01-12 DIAGNOSIS — R531 Weakness: Secondary | ICD-10-CM | POA: Diagnosis not present

## 2024-01-12 DIAGNOSIS — G3184 Mild cognitive impairment, so stated: Secondary | ICD-10-CM | POA: Diagnosis not present

## 2024-01-12 DIAGNOSIS — Z89611 Acquired absence of right leg above knee: Secondary | ICD-10-CM | POA: Diagnosis not present

## 2024-01-12 DIAGNOSIS — Z8673 Personal history of transient ischemic attack (TIA), and cerebral infarction without residual deficits: Secondary | ICD-10-CM | POA: Diagnosis not present

## 2024-01-14 DIAGNOSIS — R531 Weakness: Secondary | ICD-10-CM | POA: Diagnosis not present

## 2024-01-14 DIAGNOSIS — E119 Type 2 diabetes mellitus without complications: Secondary | ICD-10-CM | POA: Diagnosis not present

## 2024-01-14 DIAGNOSIS — G3184 Mild cognitive impairment, so stated: Secondary | ICD-10-CM | POA: Diagnosis not present

## 2024-01-16 DIAGNOSIS — Z7409 Other reduced mobility: Secondary | ICD-10-CM | POA: Diagnosis not present

## 2024-01-16 DIAGNOSIS — Z89611 Acquired absence of right leg above knee: Secondary | ICD-10-CM | POA: Diagnosis not present

## 2024-01-16 DIAGNOSIS — R531 Weakness: Secondary | ICD-10-CM | POA: Diagnosis not present

## 2024-01-20 DIAGNOSIS — G8929 Other chronic pain: Secondary | ICD-10-CM | POA: Diagnosis not present

## 2024-01-20 DIAGNOSIS — R41841 Cognitive communication deficit: Secondary | ICD-10-CM | POA: Diagnosis not present

## 2024-01-20 DIAGNOSIS — F331 Major depressive disorder, recurrent, moderate: Secondary | ICD-10-CM | POA: Diagnosis not present

## 2024-01-20 DIAGNOSIS — F411 Generalized anxiety disorder: Secondary | ICD-10-CM | POA: Diagnosis not present

## 2024-01-21 DIAGNOSIS — H1010 Acute atopic conjunctivitis, unspecified eye: Secondary | ICD-10-CM | POA: Diagnosis not present

## 2024-01-21 DIAGNOSIS — R4189 Other symptoms and signs involving cognitive functions and awareness: Secondary | ICD-10-CM | POA: Diagnosis not present

## 2024-01-21 DIAGNOSIS — R531 Weakness: Secondary | ICD-10-CM | POA: Diagnosis not present

## 2024-01-24 DIAGNOSIS — R4189 Other symptoms and signs involving cognitive functions and awareness: Secondary | ICD-10-CM | POA: Diagnosis not present

## 2024-01-24 DIAGNOSIS — H1010 Acute atopic conjunctivitis, unspecified eye: Secondary | ICD-10-CM | POA: Diagnosis not present

## 2024-01-25 DIAGNOSIS — R031 Nonspecific low blood-pressure reading: Secondary | ICD-10-CM | POA: Diagnosis not present

## 2024-01-25 DIAGNOSIS — I10 Essential (primary) hypertension: Secondary | ICD-10-CM | POA: Diagnosis not present

## 2024-01-28 DIAGNOSIS — I10 Essential (primary) hypertension: Secondary | ICD-10-CM | POA: Diagnosis not present

## 2024-02-02 DIAGNOSIS — M6281 Muscle weakness (generalized): Secondary | ICD-10-CM | POA: Diagnosis not present

## 2024-02-02 DIAGNOSIS — Z4781 Encounter for orthopedic aftercare following surgical amputation: Secondary | ICD-10-CM | POA: Diagnosis not present

## 2024-02-03 DIAGNOSIS — Z4781 Encounter for orthopedic aftercare following surgical amputation: Secondary | ICD-10-CM | POA: Diagnosis not present

## 2024-02-03 DIAGNOSIS — M6281 Muscle weakness (generalized): Secondary | ICD-10-CM | POA: Diagnosis not present

## 2024-02-04 DIAGNOSIS — Z4781 Encounter for orthopedic aftercare following surgical amputation: Secondary | ICD-10-CM | POA: Diagnosis not present

## 2024-02-04 DIAGNOSIS — M6281 Muscle weakness (generalized): Secondary | ICD-10-CM | POA: Diagnosis not present

## 2024-02-07 DIAGNOSIS — Z4781 Encounter for orthopedic aftercare following surgical amputation: Secondary | ICD-10-CM | POA: Diagnosis not present

## 2024-02-07 DIAGNOSIS — M6281 Muscle weakness (generalized): Secondary | ICD-10-CM | POA: Diagnosis not present

## 2024-02-08 DIAGNOSIS — M6281 Muscle weakness (generalized): Secondary | ICD-10-CM | POA: Diagnosis not present

## 2024-02-08 DIAGNOSIS — Z4781 Encounter for orthopedic aftercare following surgical amputation: Secondary | ICD-10-CM | POA: Diagnosis not present

## 2024-02-09 DIAGNOSIS — Z4781 Encounter for orthopedic aftercare following surgical amputation: Secondary | ICD-10-CM | POA: Diagnosis not present

## 2024-02-09 DIAGNOSIS — M6281 Muscle weakness (generalized): Secondary | ICD-10-CM | POA: Diagnosis not present

## 2024-02-10 DIAGNOSIS — Z4781 Encounter for orthopedic aftercare following surgical amputation: Secondary | ICD-10-CM | POA: Diagnosis not present

## 2024-02-10 DIAGNOSIS — M6281 Muscle weakness (generalized): Secondary | ICD-10-CM | POA: Diagnosis not present

## 2024-02-11 DIAGNOSIS — M6281 Muscle weakness (generalized): Secondary | ICD-10-CM | POA: Diagnosis not present

## 2024-02-11 DIAGNOSIS — Z4781 Encounter for orthopedic aftercare following surgical amputation: Secondary | ICD-10-CM | POA: Diagnosis not present

## 2024-02-13 DIAGNOSIS — Z4781 Encounter for orthopedic aftercare following surgical amputation: Secondary | ICD-10-CM | POA: Diagnosis not present

## 2024-02-13 DIAGNOSIS — M6281 Muscle weakness (generalized): Secondary | ICD-10-CM | POA: Diagnosis not present

## 2024-02-14 DIAGNOSIS — M6281 Muscle weakness (generalized): Secondary | ICD-10-CM | POA: Diagnosis not present

## 2024-02-14 DIAGNOSIS — Z4781 Encounter for orthopedic aftercare following surgical amputation: Secondary | ICD-10-CM | POA: Diagnosis not present

## 2024-02-15 DIAGNOSIS — R531 Weakness: Secondary | ICD-10-CM | POA: Diagnosis not present

## 2024-02-15 DIAGNOSIS — R4189 Other symptoms and signs involving cognitive functions and awareness: Secondary | ICD-10-CM | POA: Diagnosis not present

## 2024-02-15 DIAGNOSIS — E119 Type 2 diabetes mellitus without complications: Secondary | ICD-10-CM | POA: Diagnosis not present

## 2024-02-15 DIAGNOSIS — I10 Essential (primary) hypertension: Secondary | ICD-10-CM | POA: Diagnosis not present

## 2024-02-17 DIAGNOSIS — G8929 Other chronic pain: Secondary | ICD-10-CM | POA: Diagnosis not present

## 2024-02-17 DIAGNOSIS — F411 Generalized anxiety disorder: Secondary | ICD-10-CM | POA: Diagnosis not present

## 2024-02-17 DIAGNOSIS — R41841 Cognitive communication deficit: Secondary | ICD-10-CM | POA: Diagnosis not present

## 2024-02-17 DIAGNOSIS — F331 Major depressive disorder, recurrent, moderate: Secondary | ICD-10-CM | POA: Diagnosis not present

## 2024-02-19 DIAGNOSIS — J449 Chronic obstructive pulmonary disease, unspecified: Secondary | ICD-10-CM | POA: Diagnosis not present

## 2024-02-19 DIAGNOSIS — R059 Cough, unspecified: Secondary | ICD-10-CM | POA: Diagnosis not present

## 2024-02-19 DIAGNOSIS — R4189 Other symptoms and signs involving cognitive functions and awareness: Secondary | ICD-10-CM | POA: Diagnosis not present

## 2024-02-19 DIAGNOSIS — R053 Chronic cough: Secondary | ICD-10-CM | POA: Diagnosis not present

## 2024-02-19 DIAGNOSIS — R0609 Other forms of dyspnea: Secondary | ICD-10-CM | POA: Diagnosis not present

## 2024-02-19 DIAGNOSIS — R0602 Shortness of breath: Secondary | ICD-10-CM | POA: Diagnosis not present

## 2024-02-20 DIAGNOSIS — Z720 Tobacco use: Secondary | ICD-10-CM | POA: Diagnosis not present

## 2024-02-20 DIAGNOSIS — J189 Pneumonia, unspecified organism: Secondary | ICD-10-CM | POA: Diagnosis not present

## 2024-02-26 DIAGNOSIS — Z7409 Other reduced mobility: Secondary | ICD-10-CM | POA: Diagnosis not present

## 2024-02-26 DIAGNOSIS — R4189 Other symptoms and signs involving cognitive functions and awareness: Secondary | ICD-10-CM | POA: Diagnosis not present

## 2024-02-26 DIAGNOSIS — R531 Weakness: Secondary | ICD-10-CM | POA: Diagnosis not present

## 2024-02-26 DIAGNOSIS — L299 Pruritus, unspecified: Secondary | ICD-10-CM | POA: Diagnosis not present

## 2024-02-26 DIAGNOSIS — L270 Generalized skin eruption due to drugs and medicaments taken internally: Secondary | ICD-10-CM | POA: Diagnosis not present

## 2024-02-27 DIAGNOSIS — L299 Pruritus, unspecified: Secondary | ICD-10-CM | POA: Diagnosis not present

## 2024-02-27 DIAGNOSIS — L270 Generalized skin eruption due to drugs and medicaments taken internally: Secondary | ICD-10-CM | POA: Diagnosis not present

## 2024-02-28 DIAGNOSIS — R531 Weakness: Secondary | ICD-10-CM | POA: Diagnosis not present

## 2024-02-28 DIAGNOSIS — L299 Pruritus, unspecified: Secondary | ICD-10-CM | POA: Diagnosis not present

## 2024-02-28 DIAGNOSIS — R4189 Other symptoms and signs involving cognitive functions and awareness: Secondary | ICD-10-CM | POA: Diagnosis not present

## 2024-02-28 DIAGNOSIS — Z7409 Other reduced mobility: Secondary | ICD-10-CM | POA: Diagnosis not present

## 2024-02-28 DIAGNOSIS — L270 Generalized skin eruption due to drugs and medicaments taken internally: Secondary | ICD-10-CM | POA: Diagnosis not present

## 2024-02-29 DIAGNOSIS — R4189 Other symptoms and signs involving cognitive functions and awareness: Secondary | ICD-10-CM | POA: Diagnosis not present

## 2024-02-29 DIAGNOSIS — R21 Rash and other nonspecific skin eruption: Secondary | ICD-10-CM | POA: Diagnosis not present

## 2024-02-29 DIAGNOSIS — L270 Generalized skin eruption due to drugs and medicaments taken internally: Secondary | ICD-10-CM | POA: Diagnosis not present

## 2024-02-29 DIAGNOSIS — I2510 Atherosclerotic heart disease of native coronary artery without angina pectoris: Secondary | ICD-10-CM | POA: Diagnosis not present

## 2024-02-29 DIAGNOSIS — L299 Pruritus, unspecified: Secondary | ICD-10-CM | POA: Diagnosis not present

## 2024-02-29 DIAGNOSIS — Z7409 Other reduced mobility: Secondary | ICD-10-CM | POA: Diagnosis not present

## 2024-02-29 DIAGNOSIS — R531 Weakness: Secondary | ICD-10-CM | POA: Diagnosis not present

## 2024-03-01 DIAGNOSIS — E119 Type 2 diabetes mellitus without complications: Secondary | ICD-10-CM | POA: Diagnosis not present

## 2024-03-01 DIAGNOSIS — J449 Chronic obstructive pulmonary disease, unspecified: Secondary | ICD-10-CM | POA: Diagnosis not present

## 2024-03-01 DIAGNOSIS — Z89611 Acquired absence of right leg above knee: Secondary | ICD-10-CM | POA: Diagnosis not present

## 2024-03-01 DIAGNOSIS — Z8673 Personal history of transient ischemic attack (TIA), and cerebral infarction without residual deficits: Secondary | ICD-10-CM | POA: Diagnosis not present

## 2024-03-02 DIAGNOSIS — I739 Peripheral vascular disease, unspecified: Secondary | ICD-10-CM | POA: Diagnosis not present

## 2024-03-02 DIAGNOSIS — R4189 Other symptoms and signs involving cognitive functions and awareness: Secondary | ICD-10-CM | POA: Diagnosis not present

## 2024-03-02 DIAGNOSIS — Z4781 Encounter for orthopedic aftercare following surgical amputation: Secondary | ICD-10-CM | POA: Diagnosis not present

## 2024-03-02 DIAGNOSIS — L270 Generalized skin eruption due to drugs and medicaments taken internally: Secondary | ICD-10-CM | POA: Diagnosis not present

## 2024-03-02 DIAGNOSIS — Z7409 Other reduced mobility: Secondary | ICD-10-CM | POA: Diagnosis not present

## 2024-03-02 DIAGNOSIS — R531 Weakness: Secondary | ICD-10-CM | POA: Diagnosis not present

## 2024-03-07 DIAGNOSIS — L853 Xerosis cutis: Secondary | ICD-10-CM | POA: Diagnosis not present

## 2024-03-07 DIAGNOSIS — L299 Pruritus, unspecified: Secondary | ICD-10-CM | POA: Diagnosis not present

## 2024-03-07 DIAGNOSIS — R531 Weakness: Secondary | ICD-10-CM | POA: Diagnosis not present

## 2024-03-07 DIAGNOSIS — Z7409 Other reduced mobility: Secondary | ICD-10-CM | POA: Diagnosis not present

## 2024-03-07 DIAGNOSIS — Z89611 Acquired absence of right leg above knee: Secondary | ICD-10-CM | POA: Diagnosis not present

## 2024-03-08 DIAGNOSIS — I739 Peripheral vascular disease, unspecified: Secondary | ICD-10-CM | POA: Diagnosis not present

## 2024-03-08 DIAGNOSIS — Z4781 Encounter for orthopedic aftercare following surgical amputation: Secondary | ICD-10-CM | POA: Diagnosis not present

## 2024-03-09 DIAGNOSIS — I739 Peripheral vascular disease, unspecified: Secondary | ICD-10-CM | POA: Diagnosis not present

## 2024-03-09 DIAGNOSIS — Z4781 Encounter for orthopedic aftercare following surgical amputation: Secondary | ICD-10-CM | POA: Diagnosis not present

## 2024-03-16 DIAGNOSIS — F331 Major depressive disorder, recurrent, moderate: Secondary | ICD-10-CM | POA: Diagnosis not present

## 2024-03-16 DIAGNOSIS — G8929 Other chronic pain: Secondary | ICD-10-CM | POA: Diagnosis not present

## 2024-03-16 DIAGNOSIS — Z993 Dependence on wheelchair: Secondary | ICD-10-CM | POA: Diagnosis not present

## 2024-03-16 DIAGNOSIS — R41841 Cognitive communication deficit: Secondary | ICD-10-CM | POA: Diagnosis not present

## 2024-03-16 DIAGNOSIS — F411 Generalized anxiety disorder: Secondary | ICD-10-CM | POA: Diagnosis not present

## 2024-03-16 DIAGNOSIS — M79601 Pain in right arm: Secondary | ICD-10-CM | POA: Diagnosis not present

## 2024-03-16 DIAGNOSIS — R4189 Other symptoms and signs involving cognitive functions and awareness: Secondary | ICD-10-CM | POA: Diagnosis not present

## 2024-03-22 DIAGNOSIS — R531 Weakness: Secondary | ICD-10-CM | POA: Diagnosis not present

## 2024-03-22 DIAGNOSIS — L853 Xerosis cutis: Secondary | ICD-10-CM | POA: Diagnosis not present

## 2024-03-22 DIAGNOSIS — M7989 Other specified soft tissue disorders: Secondary | ICD-10-CM | POA: Diagnosis not present

## 2024-03-22 DIAGNOSIS — L299 Pruritus, unspecified: Secondary | ICD-10-CM | POA: Diagnosis not present

## 2024-03-22 DIAGNOSIS — T148XXA Other injury of unspecified body region, initial encounter: Secondary | ICD-10-CM | POA: Diagnosis not present

## 2024-03-22 DIAGNOSIS — Z7409 Other reduced mobility: Secondary | ICD-10-CM | POA: Diagnosis not present

## 2024-03-23 DIAGNOSIS — L853 Xerosis cutis: Secondary | ICD-10-CM | POA: Diagnosis not present

## 2024-03-23 DIAGNOSIS — R4189 Other symptoms and signs involving cognitive functions and awareness: Secondary | ICD-10-CM | POA: Diagnosis not present

## 2024-03-23 DIAGNOSIS — R531 Weakness: Secondary | ICD-10-CM | POA: Diagnosis not present

## 2024-03-23 DIAGNOSIS — Z7409 Other reduced mobility: Secondary | ICD-10-CM | POA: Diagnosis not present

## 2024-03-23 DIAGNOSIS — T148XXA Other injury of unspecified body region, initial encounter: Secondary | ICD-10-CM | POA: Diagnosis not present

## 2024-03-28 DIAGNOSIS — R4182 Altered mental status, unspecified: Secondary | ICD-10-CM | POA: Diagnosis not present

## 2024-03-29 DIAGNOSIS — Z89611 Acquired absence of right leg above knee: Secondary | ICD-10-CM | POA: Diagnosis not present

## 2024-03-29 DIAGNOSIS — R4189 Other symptoms and signs involving cognitive functions and awareness: Secondary | ICD-10-CM | POA: Diagnosis not present

## 2024-03-29 DIAGNOSIS — F0150 Vascular dementia without behavioral disturbance: Secondary | ICD-10-CM | POA: Diagnosis not present

## 2024-03-29 DIAGNOSIS — Z8673 Personal history of transient ischemic attack (TIA), and cerebral infarction without residual deficits: Secondary | ICD-10-CM | POA: Diagnosis not present

## 2024-03-29 DIAGNOSIS — I509 Heart failure, unspecified: Secondary | ICD-10-CM | POA: Diagnosis not present

## 2024-03-30 DIAGNOSIS — Z720 Tobacco use: Secondary | ICD-10-CM | POA: Diagnosis not present

## 2024-03-30 DIAGNOSIS — N390 Urinary tract infection, site not specified: Secondary | ICD-10-CM | POA: Diagnosis not present

## 2024-03-30 DIAGNOSIS — R531 Weakness: Secondary | ICD-10-CM | POA: Diagnosis not present

## 2024-04-06 DIAGNOSIS — G8929 Other chronic pain: Secondary | ICD-10-CM | POA: Diagnosis not present

## 2024-04-06 DIAGNOSIS — Z79899 Other long term (current) drug therapy: Secondary | ICD-10-CM | POA: Diagnosis not present

## 2024-04-06 DIAGNOSIS — F331 Major depressive disorder, recurrent, moderate: Secondary | ICD-10-CM | POA: Diagnosis not present

## 2024-04-06 DIAGNOSIS — F411 Generalized anxiety disorder: Secondary | ICD-10-CM | POA: Diagnosis not present

## 2024-04-24 DIAGNOSIS — F3342 Major depressive disorder, recurrent, in full remission: Secondary | ICD-10-CM | POA: Diagnosis not present

## 2024-04-24 DIAGNOSIS — Z87440 Personal history of urinary (tract) infections: Secondary | ICD-10-CM | POA: Diagnosis not present

## 2024-04-24 DIAGNOSIS — R350 Frequency of micturition: Secondary | ICD-10-CM | POA: Diagnosis not present

## 2024-04-25 DIAGNOSIS — R300 Dysuria: Secondary | ICD-10-CM | POA: Diagnosis not present

## 2024-04-26 DIAGNOSIS — N400 Enlarged prostate without lower urinary tract symptoms: Secondary | ICD-10-CM | POA: Diagnosis not present

## 2024-04-26 DIAGNOSIS — Z87440 Personal history of urinary (tract) infections: Secondary | ICD-10-CM | POA: Diagnosis not present

## 2024-04-26 DIAGNOSIS — R350 Frequency of micturition: Secondary | ICD-10-CM | POA: Diagnosis not present

## 2024-04-26 DIAGNOSIS — R531 Weakness: Secondary | ICD-10-CM | POA: Diagnosis not present

## 2024-04-28 DIAGNOSIS — Z89611 Acquired absence of right leg above knee: Secondary | ICD-10-CM | POA: Diagnosis not present

## 2024-05-01 DIAGNOSIS — R2689 Other abnormalities of gait and mobility: Secondary | ICD-10-CM | POA: Diagnosis not present

## 2024-05-01 DIAGNOSIS — Z89611 Acquired absence of right leg above knee: Secondary | ICD-10-CM | POA: Diagnosis not present

## 2024-05-02 DIAGNOSIS — Z89611 Acquired absence of right leg above knee: Secondary | ICD-10-CM | POA: Diagnosis not present

## 2024-05-02 DIAGNOSIS — R2689 Other abnormalities of gait and mobility: Secondary | ICD-10-CM | POA: Diagnosis not present

## 2024-05-03 DIAGNOSIS — R2689 Other abnormalities of gait and mobility: Secondary | ICD-10-CM | POA: Diagnosis not present

## 2024-05-03 DIAGNOSIS — Z89611 Acquired absence of right leg above knee: Secondary | ICD-10-CM | POA: Diagnosis not present

## 2024-05-04 DIAGNOSIS — R630 Anorexia: Secondary | ICD-10-CM | POA: Diagnosis not present

## 2024-05-04 DIAGNOSIS — Z89611 Acquired absence of right leg above knee: Secondary | ICD-10-CM | POA: Diagnosis not present

## 2024-05-04 DIAGNOSIS — G3184 Mild cognitive impairment, so stated: Secondary | ICD-10-CM | POA: Diagnosis not present

## 2024-05-04 DIAGNOSIS — R2689 Other abnormalities of gait and mobility: Secondary | ICD-10-CM | POA: Diagnosis not present

## 2024-05-04 DIAGNOSIS — F331 Major depressive disorder, recurrent, moderate: Secondary | ICD-10-CM | POA: Diagnosis not present

## 2024-05-04 DIAGNOSIS — F411 Generalized anxiety disorder: Secondary | ICD-10-CM | POA: Diagnosis not present

## 2024-05-06 DIAGNOSIS — R2689 Other abnormalities of gait and mobility: Secondary | ICD-10-CM | POA: Diagnosis not present

## 2024-05-06 DIAGNOSIS — Z89611 Acquired absence of right leg above knee: Secondary | ICD-10-CM | POA: Diagnosis not present

## 2024-05-10 DIAGNOSIS — J449 Chronic obstructive pulmonary disease, unspecified: Secondary | ICD-10-CM | POA: Diagnosis not present

## 2024-05-10 DIAGNOSIS — R300 Dysuria: Secondary | ICD-10-CM | POA: Diagnosis not present

## 2024-05-10 DIAGNOSIS — R350 Frequency of micturition: Secondary | ICD-10-CM | POA: Diagnosis not present

## 2024-05-10 DIAGNOSIS — R109 Unspecified abdominal pain: Secondary | ICD-10-CM | POA: Diagnosis not present

## 2024-05-12 ENCOUNTER — Inpatient Hospital Stay (HOSPITAL_COMMUNITY)
Admission: EM | Admit: 2024-05-12 | Discharge: 2024-06-09 | DRG: 870 | Disposition: A | Discharge status: Skilled Nursing Facility | Source: Skilled Nursing Facility | Attending: Internal Medicine | Admitting: Internal Medicine

## 2024-05-12 ENCOUNTER — Inpatient Hospital Stay (HOSPITAL_COMMUNITY)

## 2024-05-12 ENCOUNTER — Encounter (HOSPITAL_COMMUNITY)
Admission: EM | Disposition: A | Discharge status: Skilled Nursing Facility | Payer: Self-pay | Source: Skilled Nursing Facility | Attending: Internal Medicine

## 2024-05-12 ENCOUNTER — Emergency Department (HOSPITAL_COMMUNITY)

## 2024-05-12 DIAGNOSIS — Y95 Nosocomial condition: Secondary | ICD-10-CM | POA: Diagnosis not present

## 2024-05-12 DIAGNOSIS — Z1152 Encounter for screening for COVID-19: Secondary | ICD-10-CM | POA: Diagnosis not present

## 2024-05-12 DIAGNOSIS — L02215 Cutaneous abscess of perineum: Secondary | ICD-10-CM | POA: Diagnosis not present

## 2024-05-12 DIAGNOSIS — E1122 Type 2 diabetes mellitus with diabetic chronic kidney disease: Secondary | ICD-10-CM | POA: Diagnosis present

## 2024-05-12 DIAGNOSIS — J439 Emphysema, unspecified: Secondary | ICD-10-CM | POA: Diagnosis not present

## 2024-05-12 DIAGNOSIS — R7881 Bacteremia: Secondary | ICD-10-CM | POA: Diagnosis not present

## 2024-05-12 DIAGNOSIS — N401 Enlarged prostate with lower urinary tract symptoms: Secondary | ICD-10-CM | POA: Diagnosis present

## 2024-05-12 DIAGNOSIS — Z7401 Bed confinement status: Secondary | ICD-10-CM | POA: Diagnosis not present

## 2024-05-12 DIAGNOSIS — N138 Other obstructive and reflux uropathy: Secondary | ICD-10-CM | POA: Diagnosis present

## 2024-05-12 DIAGNOSIS — J9601 Acute respiratory failure with hypoxia: Secondary | ICD-10-CM | POA: Diagnosis not present

## 2024-05-12 DIAGNOSIS — I130 Hypertensive heart and chronic kidney disease with heart failure and stage 1 through stage 4 chronic kidney disease, or unspecified chronic kidney disease: Secondary | ICD-10-CM | POA: Diagnosis present

## 2024-05-12 DIAGNOSIS — J441 Chronic obstructive pulmonary disease with (acute) exacerbation: Principal | ICD-10-CM | POA: Diagnosis present

## 2024-05-12 DIAGNOSIS — E785 Hyperlipidemia, unspecified: Secondary | ICD-10-CM | POA: Diagnosis not present

## 2024-05-12 DIAGNOSIS — E1165 Type 2 diabetes mellitus with hyperglycemia: Secondary | ICD-10-CM | POA: Diagnosis present

## 2024-05-12 DIAGNOSIS — Z8249 Family history of ischemic heart disease and other diseases of the circulatory system: Secondary | ICD-10-CM

## 2024-05-12 DIAGNOSIS — F0394 Unspecified dementia, unspecified severity, with anxiety: Secondary | ICD-10-CM | POA: Diagnosis not present

## 2024-05-12 DIAGNOSIS — B377 Candidal sepsis: Secondary | ICD-10-CM | POA: Diagnosis not present

## 2024-05-12 DIAGNOSIS — E874 Mixed disorder of acid-base balance: Secondary | ICD-10-CM | POA: Diagnosis present

## 2024-05-12 DIAGNOSIS — N400 Enlarged prostate without lower urinary tract symptoms: Secondary | ICD-10-CM | POA: Diagnosis not present

## 2024-05-12 DIAGNOSIS — E870 Hyperosmolality and hypernatremia: Secondary | ICD-10-CM | POA: Diagnosis not present

## 2024-05-12 DIAGNOSIS — Z7984 Long term (current) use of oral hypoglycemic drugs: Secondary | ICD-10-CM

## 2024-05-12 DIAGNOSIS — F32A Depression, unspecified: Secondary | ICD-10-CM | POA: Diagnosis present

## 2024-05-12 DIAGNOSIS — D631 Anemia in chronic kidney disease: Secondary | ICD-10-CM | POA: Diagnosis present

## 2024-05-12 DIAGNOSIS — R059 Cough, unspecified: Secondary | ICD-10-CM | POA: Diagnosis not present

## 2024-05-12 DIAGNOSIS — Z9911 Dependence on respirator [ventilator] status: Secondary | ICD-10-CM | POA: Diagnosis not present

## 2024-05-12 DIAGNOSIS — N1832 Chronic kidney disease, stage 3b: Secondary | ICD-10-CM | POA: Diagnosis present

## 2024-05-12 DIAGNOSIS — R300 Dysuria: Secondary | ICD-10-CM | POA: Diagnosis not present

## 2024-05-12 DIAGNOSIS — G9341 Metabolic encephalopathy: Secondary | ICD-10-CM | POA: Diagnosis not present

## 2024-05-12 DIAGNOSIS — N179 Acute kidney failure, unspecified: Secondary | ICD-10-CM | POA: Diagnosis present

## 2024-05-12 DIAGNOSIS — J9600 Acute respiratory failure, unspecified whether with hypoxia or hypercapnia: Secondary | ICD-10-CM | POA: Diagnosis not present

## 2024-05-12 DIAGNOSIS — J151 Pneumonia due to Pseudomonas: Secondary | ICD-10-CM | POA: Diagnosis not present

## 2024-05-12 DIAGNOSIS — E1169 Type 2 diabetes mellitus with other specified complication: Secondary | ICD-10-CM | POA: Diagnosis not present

## 2024-05-12 DIAGNOSIS — R6521 Severe sepsis with septic shock: Secondary | ICD-10-CM | POA: Diagnosis present

## 2024-05-12 DIAGNOSIS — E875 Hyperkalemia: Secondary | ICD-10-CM | POA: Diagnosis not present

## 2024-05-12 DIAGNOSIS — I5032 Chronic diastolic (congestive) heart failure: Secondary | ICD-10-CM | POA: Diagnosis present

## 2024-05-12 DIAGNOSIS — J849 Interstitial pulmonary disease, unspecified: Secondary | ICD-10-CM | POA: Diagnosis not present

## 2024-05-12 DIAGNOSIS — R0602 Shortness of breath: Secondary | ICD-10-CM | POA: Diagnosis not present

## 2024-05-12 DIAGNOSIS — A419 Sepsis, unspecified organism: Secondary | ICD-10-CM | POA: Diagnosis not present

## 2024-05-12 DIAGNOSIS — I700 Atherosclerosis of aorta: Secondary | ICD-10-CM | POA: Diagnosis not present

## 2024-05-12 DIAGNOSIS — Z4682 Encounter for fitting and adjustment of non-vascular catheter: Secondary | ICD-10-CM | POA: Diagnosis not present

## 2024-05-12 DIAGNOSIS — N261 Atrophy of kidney (terminal): Secondary | ICD-10-CM | POA: Diagnosis not present

## 2024-05-12 DIAGNOSIS — R000 Tachycardia, unspecified: Secondary | ICD-10-CM | POA: Diagnosis not present

## 2024-05-12 DIAGNOSIS — N5089 Other specified disorders of the male genital organs: Secondary | ICD-10-CM | POA: Diagnosis present

## 2024-05-12 DIAGNOSIS — E119 Type 2 diabetes mellitus without complications: Secondary | ICD-10-CM | POA: Diagnosis not present

## 2024-05-12 DIAGNOSIS — N1831 Chronic kidney disease, stage 3a: Secondary | ICD-10-CM | POA: Diagnosis not present

## 2024-05-12 DIAGNOSIS — E43 Unspecified severe protein-calorie malnutrition: Secondary | ICD-10-CM | POA: Diagnosis not present

## 2024-05-12 DIAGNOSIS — Z7951 Long term (current) use of inhaled steroids: Secondary | ICD-10-CM

## 2024-05-12 DIAGNOSIS — K219 Gastro-esophageal reflux disease without esophagitis: Secondary | ICD-10-CM | POA: Diagnosis present

## 2024-05-12 DIAGNOSIS — Z681 Body mass index (BMI) 19 or less, adult: Secondary | ICD-10-CM

## 2024-05-12 DIAGNOSIS — J123 Human metapneumovirus pneumonia: Secondary | ICD-10-CM | POA: Diagnosis present

## 2024-05-12 DIAGNOSIS — J9690 Respiratory failure, unspecified, unspecified whether with hypoxia or hypercapnia: Secondary | ICD-10-CM | POA: Diagnosis not present

## 2024-05-12 DIAGNOSIS — G9340 Encephalopathy, unspecified: Secondary | ICD-10-CM | POA: Diagnosis not present

## 2024-05-12 DIAGNOSIS — J9602 Acute respiratory failure with hypercapnia: Secondary | ICD-10-CM | POA: Diagnosis not present

## 2024-05-12 DIAGNOSIS — I2510 Atherosclerotic heart disease of native coronary artery without angina pectoris: Secondary | ICD-10-CM | POA: Diagnosis present

## 2024-05-12 DIAGNOSIS — E872 Acidosis, unspecified: Secondary | ICD-10-CM | POA: Diagnosis present

## 2024-05-12 DIAGNOSIS — E860 Dehydration: Secondary | ICD-10-CM | POA: Diagnosis present

## 2024-05-12 DIAGNOSIS — J984 Other disorders of lung: Secondary | ICD-10-CM | POA: Diagnosis not present

## 2024-05-12 DIAGNOSIS — J949 Pleural condition, unspecified: Secondary | ICD-10-CM | POA: Diagnosis not present

## 2024-05-12 DIAGNOSIS — R918 Other nonspecific abnormal finding of lung field: Secondary | ICD-10-CM | POA: Diagnosis not present

## 2024-05-12 DIAGNOSIS — N492 Inflammatory disorders of scrotum: Secondary | ICD-10-CM | POA: Diagnosis not present

## 2024-05-12 DIAGNOSIS — J189 Pneumonia, unspecified organism: Secondary | ICD-10-CM | POA: Diagnosis not present

## 2024-05-12 DIAGNOSIS — I672 Cerebral atherosclerosis: Secondary | ICD-10-CM | POA: Diagnosis not present

## 2024-05-12 DIAGNOSIS — I110 Hypertensive heart disease with heart failure: Secondary | ICD-10-CM | POA: Diagnosis not present

## 2024-05-12 DIAGNOSIS — F17210 Nicotine dependence, cigarettes, uncomplicated: Secondary | ICD-10-CM | POA: Diagnosis present

## 2024-05-12 DIAGNOSIS — R140 Abdominal distension (gaseous): Secondary | ICD-10-CM | POA: Diagnosis not present

## 2024-05-12 DIAGNOSIS — I509 Heart failure, unspecified: Secondary | ICD-10-CM | POA: Diagnosis not present

## 2024-05-12 DIAGNOSIS — Z6821 Body mass index (BMI) 21.0-21.9, adult: Secondary | ICD-10-CM

## 2024-05-12 DIAGNOSIS — K4090 Unilateral inguinal hernia, without obstruction or gangrene, not specified as recurrent: Secondary | ICD-10-CM | POA: Diagnosis not present

## 2024-05-12 DIAGNOSIS — N281 Cyst of kidney, acquired: Secondary | ICD-10-CM | POA: Diagnosis not present

## 2024-05-12 DIAGNOSIS — T380X5A Adverse effect of glucocorticoids and synthetic analogues, initial encounter: Secondary | ICD-10-CM | POA: Diagnosis present

## 2024-05-12 DIAGNOSIS — Z781 Physical restraint status: Secondary | ICD-10-CM

## 2024-05-12 DIAGNOSIS — F05 Delirium due to known physiological condition: Secondary | ICD-10-CM | POA: Diagnosis not present

## 2024-05-12 DIAGNOSIS — R9082 White matter disease, unspecified: Secondary | ICD-10-CM | POA: Diagnosis not present

## 2024-05-12 DIAGNOSIS — N189 Chronic kidney disease, unspecified: Secondary | ICD-10-CM | POA: Diagnosis not present

## 2024-05-12 DIAGNOSIS — Z638 Other specified problems related to primary support group: Secondary | ICD-10-CM

## 2024-05-12 DIAGNOSIS — B9689 Other specified bacterial agents as the cause of diseases classified elsewhere: Secondary | ICD-10-CM | POA: Diagnosis not present

## 2024-05-12 DIAGNOSIS — I10 Essential (primary) hypertension: Secondary | ICD-10-CM | POA: Diagnosis not present

## 2024-05-12 DIAGNOSIS — J440 Chronic obstructive pulmonary disease with acute lower respiratory infection: Secondary | ICD-10-CM | POA: Diagnosis present

## 2024-05-12 DIAGNOSIS — E7220 Disorder of urea cycle metabolism, unspecified: Secondary | ICD-10-CM | POA: Diagnosis present

## 2024-05-12 DIAGNOSIS — J690 Pneumonitis due to inhalation of food and vomit: Secondary | ICD-10-CM | POA: Diagnosis not present

## 2024-05-12 DIAGNOSIS — R911 Solitary pulmonary nodule: Secondary | ICD-10-CM | POA: Diagnosis present

## 2024-05-12 DIAGNOSIS — Z89611 Acquired absence of right leg above knee: Secondary | ICD-10-CM

## 2024-05-12 DIAGNOSIS — K5730 Diverticulosis of large intestine without perforation or abscess without bleeding: Secondary | ICD-10-CM | POA: Diagnosis not present

## 2024-05-12 DIAGNOSIS — R0603 Acute respiratory distress: Secondary | ICD-10-CM | POA: Diagnosis not present

## 2024-05-12 DIAGNOSIS — R109 Unspecified abdominal pain: Secondary | ICD-10-CM | POA: Diagnosis not present

## 2024-05-12 DIAGNOSIS — R4182 Altered mental status, unspecified: Secondary | ICD-10-CM | POA: Diagnosis not present

## 2024-05-12 DIAGNOSIS — R1310 Dysphagia, unspecified: Secondary | ICD-10-CM | POA: Diagnosis present

## 2024-05-12 DIAGNOSIS — E8721 Acute metabolic acidosis: Secondary | ICD-10-CM | POA: Diagnosis not present

## 2024-05-12 DIAGNOSIS — R0902 Hypoxemia: Secondary | ICD-10-CM | POA: Diagnosis not present

## 2024-05-12 DIAGNOSIS — Z79899 Other long term (current) drug therapy: Secondary | ICD-10-CM

## 2024-05-12 DIAGNOSIS — I959 Hypotension, unspecified: Secondary | ICD-10-CM

## 2024-05-12 DIAGNOSIS — Z794 Long term (current) use of insulin: Secondary | ICD-10-CM

## 2024-05-12 DIAGNOSIS — F419 Anxiety disorder, unspecified: Secondary | ICD-10-CM | POA: Diagnosis present

## 2024-05-12 DIAGNOSIS — R578 Other shock: Secondary | ICD-10-CM | POA: Diagnosis not present

## 2024-05-12 DIAGNOSIS — Z8673 Personal history of transient ischemic attack (TIA), and cerebral infarction without residual deficits: Secondary | ICD-10-CM

## 2024-05-12 DIAGNOSIS — Z8611 Personal history of tuberculosis: Secondary | ICD-10-CM

## 2024-05-12 DIAGNOSIS — J449 Chronic obstructive pulmonary disease, unspecified: Secondary | ICD-10-CM | POA: Diagnosis not present

## 2024-05-12 DIAGNOSIS — R0689 Other abnormalities of breathing: Secondary | ICD-10-CM | POA: Diagnosis not present

## 2024-05-12 LAB — CBC WITH DIFFERENTIAL/PLATELET
Abs Immature Granulocytes: 0.19 K/uL — ABNORMAL HIGH (ref 0.00–0.07)
Basophils Absolute: 0.1 K/uL (ref 0.0–0.1)
Basophils Relative: 0 %
Eosinophils Absolute: 0 K/uL (ref 0.0–0.5)
Eosinophils Relative: 0 %
HCT: 49.5 % (ref 39.0–52.0)
Hemoglobin: 14.3 g/dL (ref 13.0–17.0)
Immature Granulocytes: 1 %
Lymphocytes Relative: 5 %
Lymphs Abs: 0.7 K/uL (ref 0.7–4.0)
MCH: 27.2 pg (ref 26.0–34.0)
MCHC: 28.9 g/dL — ABNORMAL LOW (ref 30.0–36.0)
MCV: 94.1 fL (ref 80.0–100.0)
Monocytes Absolute: 0.6 K/uL (ref 0.1–1.0)
Monocytes Relative: 4 %
Neutro Abs: 13.3 K/uL — ABNORMAL HIGH (ref 1.7–7.7)
Neutrophils Relative %: 90 %
Platelets: 193 K/uL (ref 150–400)
RBC: 5.26 MIL/uL (ref 4.22–5.81)
RDW: 15.6 % — ABNORMAL HIGH (ref 11.5–15.5)
WBC: 14.9 K/uL — ABNORMAL HIGH (ref 4.0–10.5)
nRBC: 0 % (ref 0.0–0.2)

## 2024-05-12 LAB — I-STAT VENOUS BLOOD GAS, ED
Acid-base deficit: 3 mmol/L — ABNORMAL HIGH (ref 0.0–2.0)
Bicarbonate: 26.4 mmol/L (ref 20.0–28.0)
Calcium, Ion: 1.03 mmol/L — ABNORMAL LOW (ref 1.15–1.40)
HCT: 48 % (ref 39.0–52.0)
Hemoglobin: 16.3 g/dL (ref 13.0–17.0)
O2 Saturation: 65 %
Potassium: 5.4 mmol/L — ABNORMAL HIGH (ref 3.5–5.1)
Sodium: 139 mmol/L (ref 135–145)
TCO2: 28 mmol/L (ref 22–32)
pCO2, Ven: 64.1 mmHg — ABNORMAL HIGH (ref 44–60)
pH, Ven: 7.222 — ABNORMAL LOW (ref 7.25–7.43)
pO2, Ven: 41 mmHg (ref 32–45)

## 2024-05-12 LAB — COMPREHENSIVE METABOLIC PANEL WITH GFR
ALT: 12 U/L (ref 0–44)
AST: 22 U/L (ref 15–41)
Albumin: 2.6 g/dL — ABNORMAL LOW (ref 3.5–5.0)
Alkaline Phosphatase: 78 U/L (ref 38–126)
Anion gap: 12 (ref 5–15)
BUN: 34 mg/dL — ABNORMAL HIGH (ref 8–23)
CO2: 27 mmol/L (ref 22–32)
Calcium: 8.5 mg/dL — ABNORMAL LOW (ref 8.9–10.3)
Chloride: 99 mmol/L (ref 98–111)
Creatinine, Ser: 1.98 mg/dL — ABNORMAL HIGH (ref 0.61–1.24)
GFR, Estimated: 34 mL/min — ABNORMAL LOW (ref >60)
Glucose, Bld: 109 mg/dL — ABNORMAL HIGH (ref 70–99)
Potassium: 5.4 mmol/L — ABNORMAL HIGH (ref 3.5–5.1)
Sodium: 138 mmol/L (ref 135–145)
Total Bilirubin: 0.3 mg/dL (ref 0.0–1.2)
Total Protein: 7 g/dL (ref 6.5–8.1)

## 2024-05-12 LAB — SARS CORONAVIRUS 2 BY RT PCR: SARS Coronavirus 2 by RT PCR: NEGATIVE

## 2024-05-12 LAB — I-STAT CG4 LACTIC ACID, ED: Lactic Acid, Venous: 1 mmol/L (ref 0.5–1.9)

## 2024-05-12 LAB — APTT: aPTT: 30 s (ref 24–36)

## 2024-05-12 LAB — URINALYSIS, ROUTINE W REFLEX MICROSCOPIC
Bilirubin Urine: NEGATIVE
Glucose, UA: 50 mg/dL — AB
Ketones, ur: NEGATIVE mg/dL
Nitrite: NEGATIVE
Protein, ur: 100 mg/dL — AB
Specific Gravity, Urine: 1.013 (ref 1.005–1.030)
WBC, UA: >50 WBC/hpf (ref 0–5)
pH: 5 (ref 5.0–8.0)

## 2024-05-12 LAB — RESPIRATORY PANEL BY PCR

## 2024-05-12 LAB — LIPID PANEL
Cholesterol: 82 mg/dL (ref 0–200)
HDL: 27 mg/dL — ABNORMAL LOW (ref >40)
LDL Cholesterol: 39 mg/dL (ref 0–99)
Total CHOL/HDL Ratio: 3 ratio
Triglycerides: 81 mg/dL (ref <150)
VLDL: 16 mg/dL (ref 0–40)

## 2024-05-12 LAB — GLUCOSE, CAPILLARY
Glucose-Capillary: 106 mg/dL — ABNORMAL HIGH (ref 70–99)
Glucose-Capillary: 219 mg/dL — ABNORMAL HIGH (ref 70–99)

## 2024-05-12 LAB — RAPID URINE DRUG SCREEN, HOSP PERFORMED
Amphetamines: NOT DETECTED
Barbiturates: NOT DETECTED
Benzodiazepines: NOT DETECTED
Cocaine: NOT DETECTED
Opiates: NOT DETECTED
Tetrahydrocannabinol: NOT DETECTED

## 2024-05-12 LAB — I-STAT CHEM 8, ED
BUN: 40 mg/dL — ABNORMAL HIGH (ref 8–23)
Calcium, Ion: 1.01 mmol/L — ABNORMAL LOW (ref 1.15–1.40)
Chloride: 106 mmol/L (ref 98–111)
Creatinine, Ser: 2 mg/dL — ABNORMAL HIGH (ref 0.61–1.24)
Glucose, Bld: 104 mg/dL — ABNORMAL HIGH (ref 70–99)
HCT: 49 % (ref 39.0–52.0)
Hemoglobin: 16.7 g/dL (ref 13.0–17.0)
Potassium: 5.4 mmol/L — ABNORMAL HIGH (ref 3.5–5.1)
Sodium: 139 mmol/L (ref 135–145)
TCO2: 26 mmol/L (ref 22–32)

## 2024-05-12 LAB — BLOOD GAS, ARTERIAL
Acid-base deficit: 6.6 mmol/L — ABNORMAL HIGH (ref 0.0–2.0)
Bicarbonate: 19.1 mmol/L — ABNORMAL LOW (ref 20.0–28.0)
O2 Saturation: 100 %
Patient temperature: 37.1
pCO2 arterial: 38 mmHg (ref 32–48)
pH, Arterial: 7.31 — ABNORMAL LOW (ref 7.35–7.45)
pO2, Arterial: 340 mmHg — ABNORMAL HIGH (ref 83–108)

## 2024-05-12 LAB — PROTIME-INR
INR: 1.1 (ref 0.8–1.2)
Prothrombin Time: 14.6 s (ref 11.4–15.2)

## 2024-05-12 LAB — LACTIC ACID, PLASMA
Lactic Acid, Venous: 1.6 mmol/L (ref 0.5–1.9)
Lactic Acid, Venous: 2.7 mmol/L (ref 0.5–1.9)

## 2024-05-12 LAB — TROPONIN I (HIGH SENSITIVITY)
Troponin I (High Sensitivity): 137 ng/L (ref <18)
Troponin I (High Sensitivity): 142 ng/L (ref <18)

## 2024-05-12 LAB — ETHANOL: Alcohol, Ethyl (B): <15 mg/dL (ref <15)

## 2024-05-12 LAB — MRSA NEXT GEN BY PCR, NASAL: MRSA by PCR Next Gen: DETECTED — AB

## 2024-05-12 LAB — PROCALCITONIN: Procalcitonin: 0.63 ng/mL

## 2024-05-12 LAB — AMMONIA: Ammonia: 60 umol/L — ABNORMAL HIGH (ref 9–35)

## 2024-05-12 SURGERY — LEFT HEART CATH AND CORONARY ANGIOGRAPHY
Anesthesia: LOCAL

## 2024-05-12 MED ORDER — IPRATROPIUM-ALBUTEROL 0.5-2.5 (3) MG/3ML IN SOLN
RESPIRATORY_TRACT | Status: AC
  Administered 2024-05-12: 3 mL
  Filled 2024-05-12: qty 3

## 2024-05-12 MED ORDER — INSULIN ASPART 100 UNIT/ML IJ SOLN
0.0000 [IU] | INTRAMUSCULAR | Status: DC
  Administered 2024-05-13: 3 [IU] via SUBCUTANEOUS
  Administered 2024-05-13 (×2): 2 [IU] via SUBCUTANEOUS
  Administered 2024-05-13 (×2): 3 [IU] via SUBCUTANEOUS
  Administered 2024-05-13 – 2024-05-14 (×3): 1 [IU] via SUBCUTANEOUS
  Administered 2024-05-14: 2 [IU] via SUBCUTANEOUS
  Administered 2024-05-14 (×4): 1 [IU] via SUBCUTANEOUS
  Administered 2024-05-15: 2 [IU] via SUBCUTANEOUS
  Administered 2024-05-15: 1 [IU] via SUBCUTANEOUS
  Administered 2024-05-15 (×2): 3 [IU] via SUBCUTANEOUS
  Administered 2024-05-15 (×3): 1 [IU] via SUBCUTANEOUS
  Administered 2024-05-15: 2 [IU] via SUBCUTANEOUS
  Administered 2024-05-16: 3 [IU] via SUBCUTANEOUS
  Administered 2024-05-16: 2 [IU] via SUBCUTANEOUS
  Administered 2024-05-16: 3 [IU] via SUBCUTANEOUS
  Administered 2024-05-16: 2 [IU] via SUBCUTANEOUS

## 2024-05-12 MED ORDER — VANCOMYCIN VARIABLE DOSE PER UNSTABLE RENAL FUNCTION (PHARMACIST DOSING): Status: DC

## 2024-05-12 MED ORDER — POLYETHYLENE GLYCOL 3350 17 G PO PACK
17.0000 g | PACK | Freq: Every day | ORAL | Status: DC
  Administered 2024-05-12 – 2024-05-15 (×5): 17 g
  Filled 2024-05-12 (×4): qty 1

## 2024-05-12 MED ORDER — LACTATED RINGERS IV BOLUS
1000.0000 mL | Freq: Once | INTRAVENOUS | Status: AC
  Administered 2024-05-12: 1000 mL via INTRAVENOUS

## 2024-05-12 MED ORDER — METHYLPREDNISOLONE SODIUM SUCC 125 MG IJ SOLR
125.0000 mg | Freq: Every day | INTRAMUSCULAR | Status: DC
  Administered 2024-05-12 – 2024-05-13 (×2): 125 mg via INTRAVENOUS
  Filled 2024-05-12 (×2): qty 2

## 2024-05-12 MED ORDER — SODIUM CHLORIDE 0.9 % IV SOLN
250.0000 mL | INTRAVENOUS | Status: AC
  Administered 2024-05-12: 250 mL via INTRAVENOUS

## 2024-05-12 MED ORDER — FENTANYL BOLUS VIA INFUSION
25.0000 ug | INTRAVENOUS | Status: DC | PRN
  Administered 2024-05-13 – 2024-05-14 (×3): 50 ug via INTRAVENOUS
  Administered 2024-05-14: 25 ug via INTRAVENOUS
  Administered 2024-05-14: 75 ug via INTRAVENOUS
  Administered 2024-05-14: 50 ug via INTRAVENOUS
  Administered 2024-05-14: 25 ug via INTRAVENOUS

## 2024-05-12 MED ORDER — PROPOFOL 1000 MG/100ML IV EMUL
0.0000 ug/kg/min | INTRAVENOUS | Status: DC
  Administered 2024-05-12: 20 ug/kg/min via INTRAVENOUS
  Administered 2024-05-13: 10 ug/kg/min via INTRAVENOUS
  Administered 2024-05-14 (×2): 20 ug/kg/min via INTRAVENOUS
  Filled 2024-05-12 (×2): qty 100

## 2024-05-12 MED ORDER — BUDESONIDE 0.5 MG/2ML IN SUSP
0.5000 mg | Freq: Two times a day (BID) | RESPIRATORY_TRACT | Status: DC
  Administered 2024-05-12 – 2024-06-08 (×60): 0.5 mg via RESPIRATORY_TRACT
  Filled 2024-05-12 (×56): qty 2

## 2024-05-12 MED ORDER — SODIUM CHLORIDE 0.9 % IV BOLUS
1000.0000 mL | Freq: Once | INTRAVENOUS | Status: AC
  Administered 2024-05-12: 1000 mL via INTRAVENOUS

## 2024-05-12 MED ORDER — ETOMIDATE 2 MG/ML IV SOLN
INTRAVENOUS | Status: AC | PRN
  Administered 2024-05-12: 20 mg via INTRAVENOUS

## 2024-05-12 MED ORDER — SODIUM ZIRCONIUM CYCLOSILICATE 10 G PO PACK
10.0000 g | PACK | Freq: Once | ORAL | Status: AC
  Administered 2024-05-12: 10 g
  Filled 2024-05-12: qty 1

## 2024-05-12 MED ORDER — SODIUM CHLORIDE 0.9 % IV SOLN: 500.0000 mg | INTRAVENOUS | Status: DC

## 2024-05-12 MED ORDER — DOCUSATE SODIUM 50 MG/5ML PO LIQD
100.0000 mg | Freq: Two times a day (BID) | ORAL | Status: DC | PRN
Start: 2024-05-12 — End: 2024-05-31
  Administered 2024-05-12 (×2): 100 mg

## 2024-05-12 MED ORDER — NOREPINEPHRINE 4 MG/250ML-% IV SOLN
0.0000 ug/min | INTRAVENOUS | Status: DC
  Administered 2024-05-12: 10 ug/min via INTRAVENOUS
  Administered 2024-05-13: 3 ug/min via INTRAVENOUS
  Filled 2024-05-12: qty 250

## 2024-05-12 MED ORDER — SODIUM CHLORIDE 0.9 % IV SOLN
2.0000 g | INTRAVENOUS | Status: DC
  Administered 2024-05-12 – 2024-05-15 (×5): 2 g via INTRAVENOUS
  Filled 2024-05-12 (×4): qty 20

## 2024-05-12 MED ORDER — ACETAMINOPHEN 325 MG PO TABS
650.0000 mg | ORAL_TABLET | ORAL | Status: DC | PRN
  Administered 2024-05-18 – 2024-05-28 (×3): 650 mg
  Filled 2024-05-12 (×4): qty 2

## 2024-05-12 MED ORDER — IPRATROPIUM-ALBUTEROL 0.5-2.5 (3) MG/3ML IN SOLN
3.0000 mL | RESPIRATORY_TRACT | Status: DC
  Administered 2024-05-12 – 2024-05-13 (×4): 3 mL via RESPIRATORY_TRACT
  Filled 2024-05-12 (×3): qty 3

## 2024-05-12 MED ORDER — VANCOMYCIN HCL 1500 MG/300ML IV SOLN
1500.0000 mg | Freq: Once | INTRAVENOUS | Status: AC
  Administered 2024-05-12: 1500 mg via INTRAVENOUS
  Filled 2024-05-12 (×2): qty 300

## 2024-05-12 MED ORDER — FENTANYL 2500MCG IN NS 250ML (10MCG/ML) PREMIX INFUSION
0.0000 ug/h | INTRAVENOUS | Status: DC
  Administered 2024-05-12: 25 ug/h via INTRAVENOUS
  Administered 2024-05-14: 50 ug/h via INTRAVENOUS
  Filled 2024-05-12 (×2): qty 250

## 2024-05-12 MED ORDER — SODIUM CHLORIDE 0.9 % IV SOLN: INTRAVENOUS | Status: AC

## 2024-05-12 MED ORDER — FAMOTIDINE 20 MG PO TABS
20.0000 mg | ORAL_TABLET | Freq: Two times a day (BID) | ORAL | Status: DC
  Administered 2024-05-12 – 2024-05-14 (×4): 20 mg
  Filled 2024-05-12 (×4): qty 1

## 2024-05-12 MED ORDER — METHYLPREDNISOLONE SODIUM SUCC 40 MG IJ SOLR: 40.0000 mg | Freq: Two times a day (BID) | INTRAMUSCULAR | Status: DC

## 2024-05-12 MED ORDER — MAGNESIUM SULFATE 2 GM/50ML IV SOLN
2.0000 g | Freq: Once | INTRAVENOUS | Status: AC
  Administered 2024-05-12: 2 g via INTRAVENOUS
  Filled 2024-05-12: qty 50

## 2024-05-12 MED ORDER — IPRATROPIUM-ALBUTEROL 0.5-2.5 (3) MG/3ML IN SOLN: 3.0000 mL | RESPIRATORY_TRACT | Status: DC | PRN

## 2024-05-12 MED ORDER — CHLORHEXIDINE GLUCONATE CLOTH 2 % EX PADS
6.0000 | MEDICATED_PAD | Freq: Every day | CUTANEOUS | Status: DC
  Administered 2024-05-12 – 2024-05-24 (×16): 6 via TOPICAL

## 2024-05-12 MED ORDER — ORAL CARE MOUTH RINSE
15.0000 mL | OROMUCOSAL | Status: DC
  Administered 2024-05-13 – 2024-05-25 (×186): 15 mL via OROMUCOSAL

## 2024-05-12 MED ORDER — ORAL CARE MOUTH RINSE: 15.0000 mL | OROMUCOSAL | Status: DC | PRN

## 2024-05-12 MED ORDER — PROPOFOL 1000 MG/100ML IV EMUL
INTRAVENOUS | Status: AC
  Filled 2024-05-12: qty 100

## 2024-05-12 MED ORDER — ROCURONIUM BROMIDE 10 MG/ML (PF) SYRINGE
PREFILLED_SYRINGE | INTRAVENOUS | Status: AC | PRN
  Administered 2024-05-12: 70 mg via INTRAVENOUS

## 2024-05-12 MED ORDER — IPRATROPIUM-ALBUTEROL 0.5-2.5 (3) MG/3ML IN SOLN
3.0000 mL | Freq: Once | RESPIRATORY_TRACT | Status: AC
  Administered 2024-05-12: 3 mL via RESPIRATORY_TRACT
  Filled 2024-05-12: qty 3

## 2024-05-12 MED ORDER — ATORVASTATIN CALCIUM 40 MG PO TABS
40.0000 mg | ORAL_TABLET | Freq: Every day | ORAL | Status: DC
  Administered 2024-05-13 – 2024-05-31 (×22): 40 mg
  Filled 2024-05-12 (×19): qty 1

## 2024-05-12 MED ORDER — DOCUSATE SODIUM 50 MG/5ML PO LIQD
100.0000 mg | Freq: Two times a day (BID) | ORAL | Status: DC
  Administered 2024-05-13 – 2024-05-15 (×8): 100 mg
  Filled 2024-05-12 (×7): qty 10

## 2024-05-12 MED ORDER — POLYETHYLENE GLYCOL 3350 17 G PO PACK: 17.0000 g | PACK | Freq: Every day | ORAL | Status: DC | PRN

## 2024-05-12 MED ORDER — NOREPINEPHRINE BITARTRATE 1 MG/ML IV SOLN
INTRAVENOUS | Status: AC | PRN
  Administered 2024-05-12: 10 ug/kg/min via INTRAVENOUS

## 2024-05-12 NOTE — Progress Notes (Signed)
 Pt transported from Trauma B to CT to 3M13 on the ventilator without any complications. Report handed to RT [facility].

## 2024-05-12 NOTE — ED Provider Notes (Signed)
  [place] EMERGENCY DEPARTMENT AT [facility] Provider Note   CSN: [id number] Arrival date & time: 05/12/24  1745     Patient presents with: Respiratory Distress   Clarence Maldonado is a 78 y.o. male.  Procedure Name: Intubation Date/Time: 05/12/2024 6:56 PM  Performed by: Nada Chroman, DOPreoxygenation: Pre-oxygenation with 100% oxygen  Laryngoscope Size: Mac, 4 and Glidescope Grade View: Grade II Tube size: 8.0 mm Number of attempts: 1 Placement Confirmation: ETT inserted through vocal cords under direct vision, Positive ETCO2, CO2 detector and Breath sounds checked- equal and bilateral Tube secured with: ETT holder Comments: ETT placement confirmed with CXR in correct position       Chroman Nada DO Emergency Medicine PGY2    Nada Chroman, DO 05/12/24 1857    [name], MD 05/13/24 1434

## 2024-05-12 NOTE — Progress Notes (Signed)
 Pharmacy Antibiotic Note  Clarence Maldonado is a 78 y.o. male for which pharmacy has been consulted for vancomycin  dosing for sepsis.  Patient with a history of COPD, CDK 3b, DMT2, HTN, HLD, BPH, R AKA, anxiety/depression . Patient presenting with acute respiratory failure and AMS/unresponsive.  SCr 2 - AKI WBC 14.9; LA 1; T 98.8; HR 111; RR 12  Plan: Ceftriaxone  per MD Vancomycin  1500 mg once, subsequent dosing as indicated per random vancomycin  level until renal function stable and/or improved, at which time scheduled dosing can be considered Monitor WBC, fever, renal function, cultures De-escalate when able  Height: 5' 5 (165.1 cm) IBW/kg (Calculated) : 61.5  Temp (24hrs), Avg:98.7 F (37.1 C), Min:98.6 F (37 C), Max:98.8 F (37.1 C)  Recent Labs  Lab 05/12/24 1756 05/12/24 1807 05/12/24 1813  WBC 14.9*  --   --   CREATININE 1.98* 2.00*  --   LATICACIDVEN  --   --  1.0    CrCl cannot be calculated (Unknown ideal weight.).    No Known Allergies  Microbiology results: Pending  Thank you for allowing pharmacy to be a part of this patient's care.  Dorn Buttner, PharmD, BCPS 05/12/2024 8:00 PM ED Clinical Pharmacist -  [phone]

## 2024-05-12 NOTE — ED Provider Notes (Signed)
 [place] EMERGENCY DEPARTMENT AT [facility] Provider Note   CSN: [id number] Arrival date & time: 05/12/24  1745     Patient presents with: Respiratory Distress   Clarence Maldonado is a 78 y.o. male w/ hx of lower extremity amputation, COPD, here from [facility] with resp distress - unresponsive with EMS.  Given 1 duoneb, 1000 cc fluids, prednisone.  Facility gave 1 g rocephin  and 500 mg azithromycin today for likely PNA.  EMS put in oral airway, bagging on arrival.  Pt not responding.   HPI     Prior to Admission medications   Medication Sig Start Date End Date Taking? Authorizing Provider  albuterol  (VENTOLIN  HFA) 108 (90 Base) MCG/ACT inhaler INHALE 1 TO 2 PUFFS BY MOUTH EVERY 4 TO 6 HOURS AS NEEDED FOR BREATHING Patient taking differently: Inhale 1-2 puffs into the lungs every 6 (six) hours as needed for shortness of breath or wheezing. INHALE 1 TO 2 PUFFS BY MOUTH EVERY 4 TO 6 HOURS AS NEEDED FOR BREATHING 09/23/23   Fargo, Amy E, NP  atorvastatin  (LIPITOR) 40 MG tablet Take 40 mg by mouth daily.    [provider]  buPROPion  (WELLBUTRIN  XL) 150 MG 24 hr tablet TAKE 1 TABLET(150 MG) BY MOUTH DAILY 05/24/23   Eubanks, Jessica K, NP  dapagliflozin  propanediol (FARXIGA ) 5 MG TABS tablet Take 1 tablet (5 mg total) by mouth daily before breakfast. 03/17/23   Cleotilde Garnette CHRISTELLA, MD  ferrous sulfate  (FEROSUL) 325 (65 FE) MG tablet Take 1 tablet (325 mg total) by mouth 2 (two) times daily with a meal. 04/06/23   Cleotilde Garnette CHRISTELLA, MD  fluticasone -salmeterol (ADVAIR) 250-50 MCG/ACT AEPB Inhale 1 puff into the lungs in the morning and at bedtime. 09/23/23   Fargo, Amy E, NP  furosemide  (LASIX ) 20 MG tablet Take 1 tablet (20 mg total) by mouth 3 (three) times a week. 09/24/23   Fargo, Amy E, NP  glimepiride  (AMARYL ) 1 MG tablet Take 1 tablet (1 mg total) by mouth daily with breakfast. 05/25/23   Fargo, Amy E, NP  HYDROcodone -acetaminophen  (NORCO/VICODIN) 5-325 MG tablet Take  1-2 tablets by mouth every 4 (four) hours as needed for moderate pain (pain score 4-6). 11/02/23   Vernon Ranks, MD  lisinopril  (ZESTRIL ) 5 MG tablet Take 1 tablet (5 mg total) by mouth daily. 09/24/23 03/22/24  Gil Greig BRAVO, NP  metoprolol  succinate (TOPROL -XL) 25 MG 24 hr tablet TAKE 1 TABLET(25 MG) BY MOUTH DAILY Patient taking differently: Take 25 mg by mouth daily. 03/18/23   Cleotilde Garnette CHRISTELLA, MD  oseltamivir (TAMIFLU) 75 MG capsule Take 75 mg by mouth 2 (two) times daily. For 5 Days - Starting 11/15/23    [provider]  rosuvastatin  (CRESTOR ) 20 MG tablet Take 1 tablet (20 mg total) by mouth daily. Patient not taking: Reported on 11/19/2023 10/26/23 10/25/24  Gretta Lonni PARAS, MD  tamsulosin  (FLOMAX ) 0.4 MG CAPS capsule Take 1 capsule (0.4 mg total) by mouth daily. Take with food 07/12/23   Fargo, Amy E, NP  Tiotropium Bromide Monohydrate  (SPIRIVA  RESPIMAT) 1.25 MCG/ACT AERS Inhale 2 puffs into the lungs daily. Patient taking differently: Inhale 2 puffs into the lungs at bedtime. 09/23/23   Gil Greig BRAVO, NP    Allergies: Patient has no known allergies.    Review of Systems  Updated Vital Signs BP 104/77   Pulse (!) 111   Temp 98.6 F (37 C) (Temporal)   Resp (!) 32   Ht 5' 5 (  1.651 m)   SpO2 (!) 61%   BMI 25.79 kg/m   Physical Exam Constitutional:      Comments: Thin, frail, obtunded  HENT:     Head: Normocephalic and atraumatic.  Eyes:     Conjunctiva/sclera: Conjunctivae normal.     Pupils: Pupils are equal, round, and reactive to light.  Cardiovascular:     Rate and Rhythm: Regular rhythm. Tachycardia present.  Pulmonary:     Comments: Wheezing bilaterally, bag ventilation Abdominal:     General: There is no distension.     Tenderness: There is no abdominal tenderness.  Musculoskeletal:     Comments: Lower extremity amputation  Skin:    General: Skin is warm and dry.  Neurological:     General: No focal deficit present.     Mental Status: Mental  status is at baseline.  Psychiatric:        Mood and Affect: Mood normal.        Behavior: Behavior normal.     (all labs ordered are listed, but only abnormal results are displayed) Labs Reviewed  CBC WITH DIFFERENTIAL/PLATELET - Abnormal; Notable for the following components:      Result Value   WBC 14.9 (*)    MCHC 28.9 (*)    RDW 15.6 (*)    Neutro Abs 13.3 (*)    Abs Immature Granulocytes 0.19 (*)    All other components within normal limits  COMPREHENSIVE METABOLIC PANEL WITH GFR - Abnormal; Notable for the following components:   Potassium 5.4 (*)    Glucose, Bld 109 (*)    BUN 34 (*)    Creatinine, Ser 1.98 (*)    Calcium  8.5 (*)    Albumin 2.6 (*)    GFR, Estimated 34 (*)    All other components within normal limits  LIPID PANEL - Abnormal; Notable for the following components:   HDL 27 (*)    All other components within normal limits  I-STAT CHEM 8, ED - Abnormal; Notable for the following components:   Potassium 5.4 (*)    BUN 40 (*)    Creatinine, Ser 2.00 (*)    Glucose, Bld 104 (*)    Calcium , Ion 1.01 (*)    All other components within normal limits  I-STAT VENOUS BLOOD GAS, ED - Abnormal; Notable for the following components:   pH, Ven 7.222 (*)    pCO2, Ven 64.1 (*)    Acid-base deficit 3.0 (*)    Potassium 5.4 (*)    Calcium , Ion 1.03 (*)    All other components within normal limits  TROPONIN I (HIGH SENSITIVITY) - Abnormal; Notable for the following components:   Troponin I (High Sensitivity) 137 (*)    All other components within normal limits  MRSA NEXT GEN BY PCR, NASAL  CULTURE, RESPIRATORY W GRAM STAIN  CULTURE, BLOOD (ROUTINE X 2)  CULTURE, BLOOD (ROUTINE X 2)  RESPIRATORY PANEL BY PCR  SARS CORONAVIRUS 2 BY RT PCR  PROTIME-INR  APTT  TRIGLYCERIDES  URINALYSIS, ROUTINE W REFLEX MICROSCOPIC  ETHANOL  RAPID URINE DRUG SCREEN, HOSP PERFORMED  AMMONIA  BLOOD GAS, ARTERIAL  LEGIONELLA PNEUMOPHILA SEROGP 1 UR AG  STREP PNEUMONIAE URINARY  ANTIGEN  COMPREHENSIVE METABOLIC PANEL WITH GFR  LACTIC ACID, PLASMA  LACTIC ACID, PLASMA  BLOOD GAS, ARTERIAL  HEMOGLOBIN A1C  PROCALCITONIN  I-STAT CG4 LACTIC ACID, ED    EKG: EKG Interpretation Date/Time:  Friday May 12 2024 17:51:06 EDT Ventricular Rate:  104 PR Interval:  179 QRS Duration:  85 QT Interval:  327 QTC Calculation: 431 R Axis:   72  Text Interpretation: Sinus tachycardia Inferior infarct, acute (RCA) Probable RV involvement, suggest recording right precordial leads St inferior elevations noted on prior ecg - will correlate clinically for ischemia Confirmed by Cottie Cough [phone]) on 05/12/2024 6:26:51 PM  Radiology: ARCOLA Abd Portable 1 View Result Date: 05/12/2024 CLINICAL DATA:  post intubation/OG placement EXAM: DG ABD PORTABLE 1V COMPARISON:  May 12, 2024 chest radiograph FINDINGS: Gas distended stomach in the upper abdomen. Esophagogastric tube terminates in the stomach. The last side hole is in the distal esophagus. No pneumoperitoneum. No organomegaly or radiopaque calculi. Multilevel thoracic osteophytosis. IMPRESSION: Esophagogastric tube terminates in the stomach. The last side hole is in the distal esophagus. Continued advancement is recommended for optimal positioning. Electronically Signed   By: Rogelia Myers M.D.   On: 05/12/2024 18:44   DG Chest Portable 1 View Result Date: 05/12/2024 CLINICAL DATA:  Respiratory distress EXAM: PORTABLE CHEST 1 VIEW COMPARISON:  11/19/2023 FINDINGS: Endotracheal tube terminates 3.5 cm above carina. Nasogastric terminates in the proximal stomach, better evaluated on dedicated abdominal radiograph. Apical lordotic positioning Midline trachea. Normal heart size. Atherosclerosis in the transverse aorta. Left costophrenic angle minimally excluded. No large pleural effusion and no pneumothorax. Left greater than right moderate to marked interstitial thickening and hyperinflation are similar. No superimposed lobar consolidation.  Left lower lobe scarring laterally. IMPRESSION: Appropriate position of endotracheal tube. Nasogastric tube terminates at the proximal stomach and should be advanced for optimal positioning. Please see abdominal radiograph dictated separately. Left greater than right, moderate to marked interstitial thickening likely related to COPD/chronic bronchitis. No superimposed acute process. Aortic Atherosclerosis (ICD10-I70.0). Electronically Signed   By: Rockey Kilts M.D.   On: 05/12/2024 18:43     .Critical Care  Performed by: Cottie Cough PARAS, MD Authorized by: Cottie Cough PARAS, MD   Critical care provider statement:    Critical care time (minutes):  45   Critical care time was exclusive of:  Separately billable procedures and treating other patients   Critical care was necessary to treat or prevent imminent or life-threatening deterioration of the following conditions:  Respiratory failure   Critical care was time spent personally by me on the following activities:  Ordering and performing treatments and interventions, ordering and review of laboratory studies, ordering and review of radiographic studies, pulse oximetry, review of old charts, examination of patient and evaluation of patient's response to treatment    Medications Ordered in the ED  0.9 %  sodium chloride  infusion (has no administration in time range)  fentaNYL  in NS (38mcg/ml) infusion-PREMIX (25 mcg/hr Intravenous New Bag/Given 05/12/24 1828)  fentaNYL  (SUBLIMAZE ) bolus via infusion 25-100 mcg (has no administration in time range)  propofol  (DIPRIVAN ) 1000 MG/100ML infusion (20 mcg/kg/min  70 kg (Order-Specific) Intravenous New Bag/Given 05/12/24 1812)  0.9 %  sodium chloride  infusion (has no administration in time range)  norepinephrine  (LEVOPHED ) 4mg  in (0.016 mg/mL) premix infusion (10 mcg/min Intravenous New Bag/Given 05/12/24 1824)  methylPREDNISolone  sodium succinate (SOLU-MEDROL ) 125 mg/2 mL injection 125 mg  (has no administration in time range)  magnesium  sulfate IVPB 2 g 50 mL (has no administration in time range)  famotidine  (PEPCID ) tablet 20 mg (has no administration in time range)  docusate (COLACE) 50 MG/5ML liquid 100 mg (has no administration in time range)  polyethylene glycol (MIRALAX  / GLYCOLAX ) packet 17 g (has no administration in time range)  Chlorhexidine  Gluconate Cloth  2 % PADS 6 each (has no administration in time range)  docusate sodium  (COLACE) capsule 100 mg (has no administration in time range)  polyethylene glycol (MIRALAX  / GLYCOLAX ) packet 17 g (has no administration in time range)  acetaminophen  (TYLENOL ) tablet 650 mg (has no administration in time range)  insulin  aspart (novoLOG ) injection 0-9 Units (has no administration in time range)  lactated ringers  bolus 1,000 mL (has no administration in time range)  methylPREDNISolone  sodium succinate (SOLU-MEDROL ) 40 mg/mL injection 40 mg (has no administration in time range)  ipratropium-albuterol  (DUONEB) 0.5-2.5 (3) MG/3ML nebulizer solution 3 mL (has no administration in time range)  budesonide  (PULMICORT ) nebulizer solution 0.5 mg (has no administration in time range)  ipratropium-albuterol  (DUONEB) 0.5-2.5 (3) MG/3ML nebulizer solution 3 mL (has no administration in time range)  norepinephrine  (LEVOPHED ) injection (0 mcg/kg/min  70.3 kg (Order-Specific) Intravenous Stopped 05/12/24 1825)  etomidate  (AMIDATE ) injection (20 mg Intravenous Given 05/12/24 1808)  rocuronium  (ZEMURON ) injection (70 mg Intravenous Given 05/12/24 1809)  ipratropium-albuterol  (DUONEB) 0.5-2.5 (3) MG/3ML nebulizer solution 3 mL (3 mLs Nebulization Given 05/12/24 1847)  sodium chloride  0.9 % bolus 1,000 mL (0 mLs Intravenous Stopped 05/12/24 1840)    Clinical Course as of 05/12/24 1900  Fri May 12, 2024  1816 Pt intubated for diminished GCS and airway protection [MT]  1816 Now on 5 mcg levophed  peripherally [MT]  1837 10 levo 10 prop [AG]  1847  Consulted ICU team for admission [MT]    Clinical Course User Index [AG] Nada Chroman, DO [MT] [name], MD                                 Medical Decision Making Amount and/or Complexity of Data Reviewed Labs: ordered. Radiology: ordered.  Risk Prescription drug management. Decision regarding hospitalization.   This patient presents to the ED with concern for shortness of breath, hypotension, unresponsiveness. This involves an extensive number of treatment options, and is a complaint that carries with it a high risk of complications and morbidity.  The differential diagnosis includes pneumonia versus pneumothorax versus ACS versus sepsis versus other  After arrival, the patient was found to be extremely hypotensive.  He was given fluids and started on Levophed  with improvement of his blood pressure and overall coloration.  He was able to open his eyes afterwards but was not able to verbalize in any meaningful way, or to follow commands.  He was gurgling with a lot of secretions and I was concerned about airway protection and diminish GCS.  The decision was made to intubate, I did not feel he could safely wear BiPAP.  He had received IV Rocephin  and azithromycin earlier today from his facility for pneumonia, which I think is appropriate.  Further antibiotics would be deferred to the inpatient service  Co-morbidities that complicate the patient evaluation: History of COPD  Additional history obtained from EMS   I ordered and personally interpreted labs.  The pertinent results include: Initial troponin 137.  Venous gas with mild acidosis, CO2 mildly elevated at 64, bicarb normal.  Potassium mildly elevated at 5.4, creatinine 1.9.  White blood cell count 14.9  I ordered imaging studies including x-ray of the chest and abdomen I independently visualized and interpreted imaging which showed interstitial thickening and bronchitis I agree with the radiologist  interpretation  The patient was maintained on a cardiac monitor.  I personally viewed and interpreted the cardiac monitored which showed  an underlying rhythm of: Tachycardia  Per my interpretation the patient's ECG shows sinus tachycardia with ST elevations in inferior leads  I ordered medication including medications for suspected COPD exacerbation, rapid sequence intubation medication.  Levophed  for hypotension  I have reviewed the patients home medicines and have made adjustments as needed  Test Considered: Lower immediate concern for acute pulmonary embolism, other difficulties could entirely from the differential, will defer this workup to inpatient team  I requested consultation with the cardiology, pulmonary critical care,  and discussed lab and imaging findings as well as pertinent plan - they recommend: Cardiologist do not feel that ST elevations would be consistent with ACS based on this initial presentation, and given the fact that these were elevated in the past, therefore is canceled the code STEMI.  They will be trending troponins and following along in hospital.  Critical care admit  After the interventions noted above, I reevaluated the patient and found that they have: improved -patient blood pressure did improve with vasopressors at the minimal dose of Levophed    Dispostion:  After consideration of the diagnostic results and the patients response to treatment, I feel that the patent would benefit from medical admission.      Final diagnoses:  COPD exacerbation (HCC)  Hypotension, unspecified hypotension type    ED Discharge Orders     None          [name], MD 05/12/24 1900

## 2024-05-12 NOTE — H&P (Signed)
 NAME:  Clarence Maldonado, MRN:  [id number], DOB:  04/10/1946, LOS: 0 ADMISSION DATE:  05/12/2024, CONSULTATION DATE:  8/8 REFERRING MD:  Dr. cottie, CHIEF COMPLAINT:  acute respiratory failure  History of Present Illness:  Patient is a 78 yo M w/ pertinent PMH COPD, CDK 3b, DMT2, HTN, HLD, BPH, R AKA, anxiety/depression presents to [facility] on 8/8 acute respiratory failure.  Patient resides at rehab facility. Having sob on 8/8 and prescribed rocephin /azithro. Worsening sob. Ems called found in severe resp distress unresponsive. LMA placed and being bagged en route to [facility] ED. Wheezing b/l. Patient intubated on arrival. Bp became soft post intubation requiring levo. Vbg 7.22, 64, 41, 26. Labs pending. Given nebs, iv steroids, mag. Pccm consulted for icu admission.  Prior echo in 2021 with EF of 30 to 35%. History of COPD.  On triple therapy.  No recent hospitalization for COPD.   Pertinent  Medical History   Past Medical History:  Diagnosis Date   Abnormal CT of the chest 2009   nodule 9 x 6 mm RUL   Benign localized hyperplasia of prostate with urinary obstruction and other lower urinary tract symptoms (LUTS)(600.21)    Cardiomegaly    Cerebrovascular accident (stroke) (HCC) 2009   lacunar infarct in the left thalamus   Cholelithiasis    Chronic airway obstruction, not elsewhere classified    Chronic kidney disease, stage III (moderate) (HCC)    Closed femur fracture (HCC) 1960   Contact with or exposure to viral disease    suspected    Illiterate    Obesity, unspecified    Other and unspecified hyperlipidemia    Special screening examination for respiratory tuberculosis    Tobacco use disorder    Tuberculosis    Type II or unspecified type diabetes mellitus with renal manifestations, uncontrolled(250.42)    Unspecified essential hypertension      Significant Hospital Events: Including procedures, antibiotic start and stop dates in addition to other pertinent events     Interim  History / Subjective:  See above.   Objective    Blood pressure 104/77, pulse (!) 111, temperature 98.6 F (37 C), temperature source Temporal, resp. rate (!) 32, SpO2 (!) 76%.    FiO2 (%):  [100 %] 100 %  No intake or output data in the 24 hours ending 05/12/24 1828 There were no vitals filed for this visit.  Examination: General: Sedated intubated. HENT: Intubated. Lungs: Wheezing bilaterally. Cardiovascular: Regular rate and rhythm no murmurs appreciated. Abdomen: NT, ND Extremities: Right AKA. Neuro: Pinpoint pupils but reactive to light.  Resolved problem list   Assessment and Plan   Acute respiratory failure w/ hypercapnia COPD exacerbation R/O infection Checks x-ray: No pneumonia.  Significant underlying emphysematous changes. Auto PEEP of around 16-17 with PEEP set at 5. -LTVV.  With history of COPD and auto peeping reduced respiratory rate with I to E ratio of 1 is to 5.  Permissive hypercapnia for pH greater than 7.2. -Repeat blood gas.  Will be followed by healing. - Infectious workup: Strep Legionella, MRSA nares, COVID and RPP.  Tracheal aspirate culture. Procal. - CTX plus vancomycin  antibiotics. -IV Solu-Medrol  twice daily, DuoNebs every 6 and as needed and triple nebulizing therapy.  -Goal plateau pressures less than 30 and driving pressures less than 15 -Wean PEEP/FiO2 for SpO2 >92% -VAP bundle in place -Daily SAT and SBT -wean sedation for RASS goal 0 to -1 - will do CT head.   Likely sedation induced hypotension: Auto PEEP: Vent  strategies as above. - On Levophed .  Low-dose. - Received 1 L of IV fluid bolus.  Will repeat another fluid bolus.  AKI on CKD: - Baseline creatinine around 1.5-1.7.  Currently at 2. - Avoid nephrotoxic agents.  DMT2 - Hold home Farxiga  and sulfonylurea. - SSI.  HTN: - Hold home meds.  HLD - Resume statin.  BPH - Hold Flomax .  anxiety/depression  - Hold home meds.  Hyperkalemia: - Dose of  Lokelma .    Best Practice (right click and Reselect all SmartList Selections daily)   Diet/type: NPO DVT prophylaxis SCD Pressure ulcer(s): N/A GI prophylaxis: H2B Lines: N/A Foley:  Yes, and it is still needed Code Status:  full code Last date of multidisciplinary goals of care discussion [friend/roommate Norleen was informed and plan discussed]  Labs   CBC: Recent Labs  Lab 05/12/24 1756 05/12/24 1807  WBC 14.9*  --   NEUTROABS 13.3*  --   HGB 14.3 16.7  16.3  HCT 49.5 49.0  48.0  MCV 94.1  --   PLT 193  --     Basic Metabolic Panel: Recent Labs  Lab 05/12/24 1807  NA 139  139  K 5.4*  5.4*  CL 106  GLUCOSE 104*  BUN 40*  CREATININE 2.00*   GFR: CrCl cannot be calculated (Unknown ideal weight.). Recent Labs  Lab 05/12/24 1756 05/12/24 1813  WBC 14.9*  --   LATICACIDVEN  --  1.0    Liver Function Tests: No results for input(s): AST, ALT, ALKPHOS, BILITOT, PROT, ALBUMIN in the last 168 hours. No results for input(s): LIPASE, AMYLASE in the last 168 hours. No results for input(s): AMMONIA in the last 168 hours.  ABG    Component Value Date/Time   PHART 7.329 (L) 05/19/2019 0648   PCO2ART 45.1 05/19/2019 0648   PO2ART 53.1 (L) 05/19/2019 0648   HCO3 26.4 05/12/2024 1807   TCO2 26 05/12/2024 1807   TCO2 28 05/12/2024 1807   ACIDBASEDEF 3.0 (H) 05/12/2024 1807   O2SAT 65 05/12/2024 1807     Coagulation Profile: No results for input(s): INR, PROTIME in the last 168 hours.  Cardiac Enzymes: No results for input(s): CKTOTAL, CKMB, CKMBINDEX, TROPONINI in the last 168 hours.  HbA1C: Hgb A1c MFr Bld  Date/Time Value Ref Range Status  09/23/2023 03:28 PM 5.7 (H) <5.7 % of total Hgb Final    Comment:    For someone without known diabetes, a hemoglobin  A1c value between 5.7% and 6.4% is consistent with prediabetes and should be confirmed with a  follow-up test. . For someone with known diabetes, a value  <7% indicates that their diabetes is well controlled. A1c targets should be individualized based on duration of diabetes, age, comorbid conditions, and other considerations. . This assay result is consistent with an increased risk of diabetes. . Currently, no consensus exists regarding use of hemoglobin A1c for diagnosis of diabetes for children. SABRA   06/24/2023 03:45 PM 5.8 (H) <5.7 % of total Hgb Final    Comment:    For someone without known diabetes, a hemoglobin  A1c value between 5.7% and 6.4% is consistent with prediabetes and should be confirmed with a  follow-up test. . For someone with known diabetes, a value <7% indicates that their diabetes is well controlled. A1c targets should be individualized based on duration of diabetes, age, comorbid conditions, and other considerations. . This assay result is consistent with an increased risk of diabetes. . Currently, no consensus exists regarding use  of hemoglobin A1c for diagnosis of diabetes for children. .     CBG: No results for input(s): GLUCAP in the last 168 hours.  Review of Systems:   Could not be obtained.  Patient intubated.  Past Medical History:  He,  has a past medical history of Abnormal CT of the chest (2009), Benign localized hyperplasia of prostate with urinary obstruction and other lower urinary tract symptoms (LUTS)(600.21), Cardiomegaly, Cerebrovascular accident (stroke) (HCC) (2009), Cholelithiasis, Chronic airway obstruction, not elsewhere classified, Chronic kidney disease, stage III (moderate) (HCC), Closed femur fracture (HCC) (1960), Contact with or exposure to viral disease, Illiterate, Obesity, unspecified, Other and unspecified hyperlipidemia, Special screening examination for respiratory tuberculosis, Tobacco use disorder, Tuberculosis, Type II or unspecified type diabetes mellitus with renal manifestations, uncontrolled(250.42), and Unspecified essential hypertension.   Surgical  History:   Past Surgical History:  Procedure Laterality Date   ABDOMINAL AORTOGRAM W/LOWER EXTREMITY N/A 10/25/2023   Procedure: ABDOMINAL AORTOGRAM W/LOWER EXTREMITY;  Surgeon: Pearline Norman RAMAN, MD;  Location[facility] INVASIVE CV LAB;  Service: Cardiovascular;  Laterality: N/A;   AMPUTATION Right 10/28/2023   Procedure: AMPUTATION ABOVE KNEE;  Surgeon: Serene Gaile ORN, MD;  Location: [facility] OR;  Service: Vascular;  Laterality: Right;   FEMUR FRACTURE SURGERY Left 1960     Social History:   reports that he has quit smoking. His smoking use included cigarettes. He has a 30 pack-year smoking history. He has never used smokeless tobacco. He reports that he does not currently use alcohol. He reports that he does not use drugs.   Family History:  His family history includes Heart disease in his mother.   Allergies No Known Allergies   Home Medications  Prior to Admission medications   Medication Sig Start Date End Date Taking? Authorizing Provider  albuterol  (VENTOLIN  HFA) 108 (90 Base) MCG/ACT inhaler INHALE 1 TO 2 PUFFS BY MOUTH EVERY 4 TO 6 HOURS AS NEEDED FOR BREATHING Patient taking differently: Inhale 1-2 puffs into the lungs every 6 (six) hours as needed for shortness of breath or wheezing. INHALE 1 TO 2 PUFFS BY MOUTH EVERY 4 TO 6 HOURS AS NEEDED FOR BREATHING 09/23/23   Fargo, Amy E, NP  atorvastatin  (LIPITOR) 40 MG tablet Take 40 mg by mouth daily.    [provider]  buPROPion  (WELLBUTRIN  XL) 150 MG 24 hr tablet TAKE 1 TABLET(150 MG) BY MOUTH DAILY 05/24/23   Eubanks, Jessica K, NP  dapagliflozin  propanediol (FARXIGA ) 5 MG TABS tablet Take 1 tablet (5 mg total) by mouth daily before breakfast. 03/17/23   Cleotilde Garnette HERO, MD  ferrous sulfate  (FEROSUL) 325 (65 FE) MG tablet Take 1 tablet (325 mg total) by mouth 2 (two) times daily with a meal. 04/06/23   Cleotilde Garnette HERO, MD  fluticasone -salmeterol (ADVAIR) 250-50 MCG/ACT AEPB Inhale 1 puff into the lungs in the morning and at bedtime.  09/23/23   Fargo, Amy E, NP  furosemide  (LASIX ) 20 MG tablet Take 1 tablet (20 mg total) by mouth 3 (three) times a week. 09/24/23   Fargo, Amy E, NP  glimepiride  (AMARYL ) 1 MG tablet Take 1 tablet (1 mg total) by mouth daily with breakfast. 05/25/23   Fargo, Amy E, NP  HYDROcodone -acetaminophen  (NORCO/VICODIN) 5-325 MG tablet Take 1-2 tablets by mouth every 4 (four) hours as needed for moderate pain (pain score 4-6). 11/02/23   Vernon Ranks, MD  lisinopril  (ZESTRIL ) 5 MG tablet Take 1 tablet (5 mg total) by mouth daily. 09/24/23 03/22/24  Gil Greig BRAVO, NP  metoprolol  succinate (TOPROL -XL) 25 MG 24 hr tablet TAKE 1 TABLET(25 MG) BY MOUTH DAILY Patient taking differently: Take 25 mg by mouth daily. 03/18/23   Cleotilde Garnette HERO, MD  oseltamivir (TAMIFLU) 75 MG capsule Take 75 mg by mouth 2 (two) times daily. For 5 Days - Starting 11/15/23    [provider]  rosuvastatin  (CRESTOR ) 20 MG tablet Take 1 tablet (20 mg total) by mouth daily. Patient not taking: Reported on 11/19/2023 10/26/23 10/25/24  Gretta Lonni PARAS, MD  tamsulosin  (FLOMAX ) 0.4 MG CAPS capsule Take 1 capsule (0.4 mg total) by mouth daily. Take with food 07/12/23   Fargo, Amy E, NP  Tiotropium Bromide Monohydrate  (SPIRIVA  RESPIMAT) 1.25 MCG/ACT AERS Inhale 2 puffs into the lungs daily. Patient taking differently: Inhale 2 puffs into the lungs at bedtime. 09/23/23   Gil Greig BRAVO, NP     Critical care time: 45 minutes    JD Emilio RIGGERS [facility] 05/12/2024, 6:28 PM  Please see [email or web address] for pager details.  From 7A-7P if no response, please call [phone]. After hours, please call ELink [phone].

## 2024-05-12 NOTE — ED Notes (Signed)
 Pt intubated, bil breath sounds, ET 24 @ teeth.

## 2024-05-12 NOTE — ED Notes (Signed)
 X-ray at bedside

## 2024-05-12 NOTE — ED Triage Notes (Signed)
 Pt from [employer] with ems for resp distress/failure. Per staff pt suddenly c.o SOB around noon, hx of COPD.  Staff gave 1g rocephin  IV/ 40mg  prednisone/500mg  azithromycin/ 1L NS  When ems arrived pt was tachypneic in the 30s and unresponsive gave 1 duoneb, when they transferred him to the truck he went apneic and they started bagging.   Pt arrives to ED unresponsive and bagging.

## 2024-05-12 NOTE — Progress Notes (Signed)
 eLink Physician-Brief Progress Note Patient Name: Clarence Maldonado DOB: [birth year] MRN: [id number]   Date of Service  05/12/2024  HPI/Events of Note  78 yo M w/ pertinent PMH COPD, CDK 3b, DMT2, HTN, HLD, BPH, R AKA, anxiety/depression presents to [name] on 8/8 acute respiratory failure.   On examination, patient is tachycardic but otherwise normal vitals.  Saturating 100% on 100% FiO2.  Running propofol  and fentanyl  infusions.  Results consistent with adequate oxygenation and ventilation, elevated creatinine, leukocytosis.  Urinalysis grossly positive.  Cultures collected.  eICU Interventions  Maintain mechanical ventilation, daily spontaneous awakening/breathing trials  Empiric antibiotics, scheduled steroids, scheduled SVNs.  Norepinephrine  as needed to maintain MAP greater than 65  DVT prophylaxis with SCDs, consider chemoprophylaxis GI prophylaxis with famotidine      Intervention Category Evaluation Type: New Patient Evaluation  Teira Arcilla 05/12/2024, 8:42 PM

## 2024-05-12 NOTE — ED Notes (Signed)
 Cardiology at bedside.

## 2024-05-13 ENCOUNTER — Other Ambulatory Visit (HOSPITAL_COMMUNITY)

## 2024-05-13 DIAGNOSIS — I10 Essential (primary) hypertension: Secondary | ICD-10-CM

## 2024-05-13 DIAGNOSIS — J441 Chronic obstructive pulmonary disease with (acute) exacerbation: Secondary | ICD-10-CM

## 2024-05-13 DIAGNOSIS — E119 Type 2 diabetes mellitus without complications: Secondary | ICD-10-CM

## 2024-05-13 DIAGNOSIS — N179 Acute kidney failure, unspecified: Secondary | ICD-10-CM | POA: Diagnosis not present

## 2024-05-13 DIAGNOSIS — N400 Enlarged prostate without lower urinary tract symptoms: Secondary | ICD-10-CM

## 2024-05-13 DIAGNOSIS — J9602 Acute respiratory failure with hypercapnia: Secondary | ICD-10-CM | POA: Diagnosis not present

## 2024-05-13 DIAGNOSIS — G9340 Encephalopathy, unspecified: Secondary | ICD-10-CM

## 2024-05-13 DIAGNOSIS — E875 Hyperkalemia: Secondary | ICD-10-CM

## 2024-05-13 LAB — COMPREHENSIVE METABOLIC PANEL WITH GFR
ALT: 11 U/L (ref 0–44)
AST: 22 U/L (ref 15–41)
Albumin: 2.2 g/dL — ABNORMAL LOW (ref 3.5–5.0)
Alkaline Phosphatase: 70 U/L (ref 38–126)
Anion gap: 15 (ref 5–15)
BUN: 46 mg/dL — ABNORMAL HIGH (ref 8–23)
CO2: 18 mmol/L — ABNORMAL LOW (ref 22–32)
Calcium: 8.2 mg/dL — ABNORMAL LOW (ref 8.9–10.3)
Chloride: 104 mmol/L (ref 98–111)
Creatinine, Ser: 2.07 mg/dL — ABNORMAL HIGH (ref 0.61–1.24)
GFR, Estimated: 32 mL/min — ABNORMAL LOW (ref >60)
Glucose, Bld: 282 mg/dL — ABNORMAL HIGH (ref 70–99)
Potassium: 4.5 mmol/L (ref 3.5–5.1)
Sodium: 137 mmol/L (ref 135–145)
Total Bilirubin: 1 mg/dL (ref 0.0–1.2)
Total Protein: 6.1 g/dL — ABNORMAL LOW (ref 6.5–8.1)

## 2024-05-13 LAB — STREP PNEUMONIAE URINARY ANTIGEN: Strep Pneumo Urinary Antigen: NEGATIVE

## 2024-05-13 LAB — GLUCOSE, CAPILLARY
Glucose-Capillary: 121 mg/dL — ABNORMAL HIGH (ref 70–99)
Glucose-Capillary: 131 mg/dL — ABNORMAL HIGH (ref 70–99)
Glucose-Capillary: 169 mg/dL — ABNORMAL HIGH (ref 70–99)
Glucose-Capillary: 179 mg/dL — ABNORMAL HIGH (ref 70–99)
Glucose-Capillary: 208 mg/dL — ABNORMAL HIGH (ref 70–99)
Glucose-Capillary: 237 mg/dL — ABNORMAL HIGH (ref 70–99)

## 2024-05-13 LAB — TRIGLYCERIDES: Triglycerides: 80 mg/dL (ref <150)

## 2024-05-13 LAB — LACTIC ACID, PLASMA: Lactic Acid, Venous: 1.7 mmol/L (ref 0.5–1.9)

## 2024-05-13 MED ORDER — IPRATROPIUM-ALBUTEROL 0.5-2.5 (3) MG/3ML IN SOLN
3.0000 mL | Freq: Four times a day (QID) | RESPIRATORY_TRACT | Status: DC
  Administered 2024-05-13 – 2024-05-16 (×18): 3 mL via RESPIRATORY_TRACT
  Filled 2024-05-13 (×12): qty 3

## 2024-05-13 MED ORDER — ALBUTEROL SULFATE (2.5 MG/3ML) 0.083% IN NEBU
2.5000 mg | INHALATION_SOLUTION | RESPIRATORY_TRACT | Status: DC | PRN
  Administered 2024-05-14 – 2024-05-31 (×4): 2.5 mg via RESPIRATORY_TRACT
  Filled 2024-05-13 (×3): qty 3

## 2024-05-13 MED ORDER — HEPARIN SODIUM (PORCINE) 5000 UNIT/ML IJ SOLN
5000.0000 [IU] | Freq: Three times a day (TID) | INTRAMUSCULAR | Status: DC
  Administered 2024-05-13 – 2024-05-18 (×26): 5000 [IU] via SUBCUTANEOUS
  Filled 2024-05-13 (×17): qty 1

## 2024-05-13 MED ORDER — MUPIROCIN 2 % EX OINT
1.0000 | TOPICAL_OINTMENT | Freq: Two times a day (BID) | CUTANEOUS | Status: AC
  Administered 2024-05-13 – 2024-05-17 (×15): 1 via NASAL
  Filled 2024-05-13 (×2): qty 22

## 2024-05-13 MED ORDER — METHYLPREDNISOLONE SODIUM SUCC 40 MG IJ SOLR
40.0000 mg | Freq: Two times a day (BID) | INTRAMUSCULAR | Status: DC
  Administered 2024-05-13 – 2024-05-15 (×7): 40 mg via INTRAVENOUS
  Filled 2024-05-13 (×6): qty 1

## 2024-05-13 MED ORDER — CHLORHEXIDINE GLUCONATE CLOTH 2 % EX PADS
6.0000 | MEDICATED_PAD | Freq: Every day | CUTANEOUS | Status: DC
  Administered 2024-05-14 – 2024-05-15 (×3): 6 via TOPICAL

## 2024-05-13 NOTE — Plan of Care (Signed)
  Problem: Activity: Goal: Ability to tolerate increased activity will improve Outcome: Progressing   Problem: Respiratory: Goal: Ability to maintain a clear airway and adequate ventilation will improve Outcome: Progressing   Problem: Role Relationship: Goal: Method of communication will improve Outcome: Progressing   Problem: Clinical Measurements: Goal: Ability to maintain clinical measurements within normal limits will improve Outcome: Progressing

## 2024-05-13 NOTE — Progress Notes (Signed)
 NAME:  Clarence Maldonado, MRN:  [id number], DOB:  03-25-1946, LOS: 1 ADMISSION DATE:  05/12/2024, CONSULTATION DATE:  8/8 REFERRING MD:  Dr. cottie, CHIEF COMPLAINT:  acute respiratory failure  History of Present Illness:  Patient is a 78 yo M w/ pertinent PMH COPD, CDK 3b, DMT2, HTN, HLD, BPH, R AKA, anxiety/depression presents to [facility] on 8/8 acute respiratory failure.  Patient resides at rehab facility. Having sob on 8/8 and prescribed rocephin /azithro. Worsening sob. Ems called found in severe resp distress unresponsive. LMA placed and being bagged en route to [facility] ED. Wheezing b/l. Patient intubated on arrival. Bp became soft post intubation requiring levo. Vbg 7.22, 64, 41, 26. Labs pending. Given nebs, iv steroids, mag. Pccm consulted for icu admission.  Prior echo in 2021 with EF of 30 to 35%. History of COPD.  On triple therapy.  No recent hospitalization for COPD.   Pertinent  Medical History   Past Medical History:  Diagnosis Date   Abnormal CT of the chest 2009   nodule 9 x 6 mm RUL   Benign localized hyperplasia of prostate with urinary obstruction and other lower urinary tract symptoms (LUTS)(600.21)    Cardiomegaly    Cerebrovascular accident (stroke) (HCC) 2009   lacunar infarct in the left thalamus   Cholelithiasis    Chronic airway obstruction, not elsewhere classified    Chronic kidney disease, stage III (moderate) (HCC)    Closed femur fracture (HCC) 1960   Contact with or exposure to viral disease    suspected    Illiterate    Obesity, unspecified    Other and unspecified hyperlipidemia    Special screening examination for respiratory tuberculosis    Tobacco use disorder    Tuberculosis    Type II or unspecified type diabetes mellitus with renal manifestations, uncontrolled(250.42)    Unspecified essential hypertension      Significant Hospital Events: Including procedures, antibiotic start and stop dates in addition to other pertinent events     Interim  History / Subjective:  0.40, PEEP 5 Norepinephrine  1 Propofol  10 I/O+ 2.4 L total SCr 2.0 > 2.07 Ammonia 60 Procalcitonin 0.63  Objective    Blood pressure 94/68, pulse (!) 104, temperature (!) 97.5 F (36.4 C), resp. rate 12, height 5' 5 (1.651 m), weight 63.1 kg, SpO2 100%.    Vent Mode: PRVC FiO2 (%):  [40 %-100 %] 40 % Set Rate:  [15 bmp-28 bmp] 15 bmp Vt Set:  [500 mL-510 mL] 510 mL PEEP:  [5 cmH20] 5 cmH20 Plateau Pressure:  [15 cmH20-31 cmH20] 17 cmH20   Intake/Output Summary (Last 24 hours) at 05/13/2024 1013 Last data filed at 05/13/2024 0800 Gross per 24 hour  Intake 2920.87 ml  Output 490 ml  Net 2430.87 ml   Filed Weights   05/12/24 2000 05/13/24 0416  Weight: 61.9 kg 63.1 kg    Examination: General: Ill-appearing elderly man on mechanical ventilation HENT: ET tube in position, oropharynx otherwise clear Lungs: Good air movement, no wheezes at this time Cardiovascular: Regular, no murmur Abdomen: Nondistended with positive bowel sounds Extremities: Right AKA Neuro: Pupils equal.  Hypersomnolent, will wake with vigorous stimulation.  No spontaneous drive to breathe and less stimulated.  Resolved problem list   Assessment and Plan   Acute respiratory failure w/ hypercapnia COPD exacerbation R/O infection, chest x-ray reassuring -PRVC 8 cc/kg, if he develops auto PEEP then could consider slight increase VT (with decrease RR) in the absence of any infiltrates to avoid auto PEEP -  No clear evidence of pneumonia on chest x-ray, procalcitonin 0.63, plan to follow trend - Continue empiric ceftriaxone , discontinue vancomycin  8/9 - Follow respiratory culture if able to produce.  Follow pneumococcal and Legionella antigens - Scheduled Solu-Medrol  - Scheduled DuoNeb and Pulmicort  neb - Albuterol  available if needed - VAP prevention order set in place - Sedation as per PAD protocol, need to minimize.  He is currently too sedated to do an SBT  Acute  encephalopathy, likely metabolic and also sedating medications - Head CT reassuring - Minimize sedation as above - Hypercapnia improved  Likely sedation induced hypotension: Auto PEEP: -Vent strategy as above - Wean norepinephrine  to off - IV fluid resuscitation  AKI on CKD: Anion gap metabolic acidosis, slightly elevated lactate - Follow urine output, BMP with volume resuscitation - Follow lactate for clearance  DMT2 -Holding home Farxiga  and Amaryl  - Manage with sliding scale insulin   HTN: -Home meds on hold, reinitiate as his hemodynamics normalize  HLD -Continue statin  BPH -Flomax  on hold, reinitiate when able  anxiety/depression  -Home Wellbutrin  on hold  Hyperkalemia, improved -Received Lokelma  - Following BMP    Best Practice (right click and Reselect all SmartList Selections daily)   Diet/type: NPO DVT prophylaxis SCD Pressure ulcer(s): N/A GI prophylaxis: H2B Lines: N/A Foley:  Yes, and it is still needed Code Status:  full code Last date of multidisciplinary goals of care discussion [friend/roommate Norleen was informed and plan discussed]  Labs   CBC: Recent Labs  Lab 05/12/24 1756 05/12/24 1807  WBC 14.9*  --   NEUTROABS 13.3*  --   HGB 14.3 16.7  16.3  HCT 49.5 49.0  48.0  MCV 94.1  --   PLT 193  --     Basic Metabolic Panel: Recent Labs  Lab 05/12/24 1756 05/12/24 1807 05/13/24 0250  NA 138 139  139 137  K 5.4* 5.4*  5.4* 4.5  CL 99 106 104  CO2 27  --  18*  GLUCOSE 109* 104* 282*  BUN 34* 40* 46*  CREATININE 1.98* 2.00* 2.07*  CALCIUM  8.5*  --  8.2*   GFR: Estimated Creatinine Clearance: 26 mL/min (A) (by C-G formula based on SCr of 2.07 mg/dL (H)). Recent Labs  Lab 05/12/24 1756 05/12/24 1813 05/12/24 1929 05/12/24 1930 05/12/24 2031 05/13/24 0250  PROCALCITON  --   --   --  0.63  --   --   WBC 14.9*  --   --   --   --   --   LATICACIDVEN  --  1.0 1.6  --  2.7* 1.7    Liver Function Tests: Recent Labs   Lab 05/12/24 1756 05/13/24 0250  AST 22 22  ALT 12 11  ALKPHOS 78 70  BILITOT 0.3 1.0  PROT 7.0 6.1*  ALBUMIN 2.6* 2.2*   No results for input(s): LIPASE, AMYLASE in the last 168 hours. Recent Labs  Lab 05/12/24 1930  AMMONIA 60*    ABG    Component Value Date/Time   PHART 7.31 (L) 05/12/2024 1941   PCO2ART 38 05/12/2024 1941   PO2ART 340 (H) 05/12/2024 1941   HCO3 19.1 (L) 05/12/2024 1941   TCO2 26 05/12/2024 1807   TCO2 28 05/12/2024 1807   ACIDBASEDEF 6.6 (H) 05/12/2024 1941   O2SAT 100 05/12/2024 1941     Coagulation Profile: Recent Labs  Lab 05/12/24 1756  INR 1.1    Cardiac Enzymes: No results for input(s): CKTOTAL, CKMB, CKMBINDEX, TROPONINI in the last 168 hours.  CBG: Recent Labs  Lab 05/12/24 2036 05/12/24 2357 05/13/24 0355 05/13/24 0752  GLUCAP 106* 219* 237* 179*     Critical care time: minutes    Lamar Chris, MD, PhD 05/13/2024, 10:14 AM [facility] [phone] or if no answer before 7:00PM call [phone] For any issues after 7:00PM please call eLink [phone]

## 2024-05-13 NOTE — Plan of Care (Signed)
  Problem: Activity: Goal: Ability to tolerate increased activity will improve Outcome: Progressing   Problem: Respiratory: Goal: Ability to maintain a clear airway and adequate ventilation will improve Outcome: Progressing   Problem: Role Relationship: Goal: Method of communication will improve Outcome: Progressing   Problem: Education: Goal: Knowledge of General Education information will improve Description: Including pain rating scale, medication(s)/side effects and non-pharmacologic comfort measures Outcome: Progressing   Problem: Health Behavior/Discharge Planning: Goal: Ability to manage health-related needs will improve Outcome: Progressing   Problem: Clinical Measurements: Goal: Ability to maintain clinical measurements within normal limits will improve Outcome: Progressing Goal: Will remain free from infection Outcome: Progressing Goal: Diagnostic test results will improve Outcome: Progressing Goal: Respiratory complications will improve Outcome: Progressing Goal: Cardiovascular complication will be avoided Outcome: Progressing   Problem: Activity: Goal: Risk for activity intolerance will decrease Outcome: Progressing   Problem: Nutrition: Goal: Adequate nutrition will be maintained Outcome: Progressing   Problem: Coping: Goal: Level of anxiety will decrease Outcome: Progressing   Problem: Elimination: Goal: Will not experience complications related to bowel motility Outcome: Progressing Goal: Will not experience complications related to urinary retention Outcome: Progressing   Problem: Pain Managment: Goal: General experience of comfort will improve and/or be controlled Outcome: Progressing   Problem: Safety: Goal: Ability to remain free from injury will improve Outcome: Progressing   Problem: Skin Integrity: Goal: Risk for impaired skin integrity will decrease Outcome: Progressing

## 2024-05-14 ENCOUNTER — Inpatient Hospital Stay (HOSPITAL_COMMUNITY)

## 2024-05-14 DIAGNOSIS — N179 Acute kidney failure, unspecified: Secondary | ICD-10-CM | POA: Diagnosis not present

## 2024-05-14 DIAGNOSIS — R578 Other shock: Secondary | ICD-10-CM | POA: Diagnosis not present

## 2024-05-14 DIAGNOSIS — J441 Chronic obstructive pulmonary disease with (acute) exacerbation: Secondary | ICD-10-CM | POA: Diagnosis not present

## 2024-05-14 DIAGNOSIS — J9602 Acute respiratory failure with hypercapnia: Secondary | ICD-10-CM | POA: Diagnosis not present

## 2024-05-14 DIAGNOSIS — E119 Type 2 diabetes mellitus without complications: Secondary | ICD-10-CM | POA: Diagnosis not present

## 2024-05-14 LAB — BASIC METABOLIC PANEL WITH GFR
Anion gap: 12 (ref 5–15)
BUN: 55 mg/dL — ABNORMAL HIGH (ref 8–23)
CO2: 24 mmol/L (ref 22–32)
Calcium: 8.7 mg/dL — ABNORMAL LOW (ref 8.9–10.3)
Chloride: 105 mmol/L (ref 98–111)
Creatinine, Ser: 2.22 mg/dL — ABNORMAL HIGH (ref 0.61–1.24)
GFR, Estimated: 30 mL/min — ABNORMAL LOW (ref >60)
Glucose, Bld: 141 mg/dL — ABNORMAL HIGH (ref 70–99)
Potassium: 4.3 mmol/L (ref 3.5–5.1)
Sodium: 141 mmol/L (ref 135–145)

## 2024-05-14 LAB — BLOOD CULTURE ID PANEL (REFLEXED) - BCID2

## 2024-05-14 LAB — ECHOCARDIOGRAM COMPLETE
AR max vel: 3 cm2
AV Area VTI: 3.45 cm2
AV Area mean vel: 2.68 cm2
AV Mean grad: 5 mmHg
AV Peak grad: 10.4 mmHg
Ao pk vel: 1.62 m/s
Height: 65 in
MV VTI: 2.64 cm2
P 1/2 time: 269 ms
S' Lateral: 2.9 cm
Single Plane A2C EF: 62.2 %
Weight: 2134.05 [oz_av]

## 2024-05-14 LAB — GLUCOSE, CAPILLARY
Glucose-Capillary: 125 mg/dL — ABNORMAL HIGH (ref 70–99)
Glucose-Capillary: 130 mg/dL — ABNORMAL HIGH (ref 70–99)
Glucose-Capillary: 134 mg/dL — ABNORMAL HIGH (ref 70–99)
Glucose-Capillary: 139 mg/dL — ABNORMAL HIGH (ref 70–99)
Glucose-Capillary: 139 mg/dL — ABNORMAL HIGH (ref 70–99)
Glucose-Capillary: 151 mg/dL — ABNORMAL HIGH (ref 70–99)

## 2024-05-14 LAB — PHOSPHORUS: Phosphorus: 5.5 mg/dL — ABNORMAL HIGH (ref 2.5–4.6)

## 2024-05-14 LAB — CBC
HCT: 43.5 % (ref 39.0–52.0)
Hemoglobin: 13 g/dL (ref 13.0–17.0)
MCH: 27 pg (ref 26.0–34.0)
MCHC: 29.9 g/dL — ABNORMAL LOW (ref 30.0–36.0)
MCV: 90.2 fL (ref 80.0–100.0)
Platelets: 184 K/uL (ref 150–400)
RBC: 4.82 MIL/uL (ref 4.22–5.81)
RDW: 15.7 % — ABNORMAL HIGH (ref 11.5–15.5)
WBC: 18.4 K/uL — ABNORMAL HIGH (ref 4.0–10.5)
nRBC: 0 % (ref 0.0–0.2)

## 2024-05-14 LAB — MAGNESIUM: Magnesium: 3.1 mg/dL — ABNORMAL HIGH (ref 1.7–2.4)

## 2024-05-14 MED ORDER — METOPROLOL TARTRATE 5 MG/5ML IV SOLN
5.0000 mg | Freq: Four times a day (QID) | INTRAVENOUS | Status: DC | PRN
  Administered 2024-05-27: 5 mg via INTRAVENOUS
  Filled 2024-05-14: qty 5

## 2024-05-14 MED ORDER — DEXMEDETOMIDINE HCL IN NACL 400 MCG/100ML IV SOLN
0.0000 ug/kg/h | INTRAVENOUS | Status: DC
  Administered 2024-05-14: 0.7 ug/kg/h via INTRAVENOUS
  Administered 2024-05-14: 0.4 ug/kg/h via INTRAVENOUS
  Administered 2024-05-15: 0.7 ug/kg/h via INTRAVENOUS
  Administered 2024-05-15 (×2): 0.6 ug/kg/h via INTRAVENOUS
  Administered 2024-05-15: 0.7 ug/kg/h via INTRAVENOUS
  Administered 2024-05-16 – 2024-05-17 (×4): 0.4 ug/kg/h via INTRAVENOUS
  Administered 2024-05-17 (×2): 0.6 ug/kg/h via INTRAVENOUS
  Administered 2024-05-18: 0.2 ug/kg/h via INTRAVENOUS
  Administered 2024-05-20 – 2024-05-21 (×3): 0.4 ug/kg/h via INTRAVENOUS
  Filled 2024-05-14 (×12): qty 100

## 2024-05-14 MED ORDER — METOPROLOL TARTRATE 5 MG/5ML IV SOLN: 5.0000 mg | Freq: Four times a day (QID) | INTRAVENOUS | Status: DC | PRN

## 2024-05-14 MED ORDER — FAMOTIDINE 20 MG PO TABS
20.0000 mg | ORAL_TABLET | Freq: Every day | ORAL | Status: DC
  Administered 2024-05-15 (×2): 20 mg
  Filled 2024-05-14: qty 1

## 2024-05-14 NOTE — Plan of Care (Signed)
  Problem: Respiratory: Goal: Ability to maintain a clear airway and adequate ventilation will improve Outcome: Progressing   Problem: Clinical Measurements: Goal: Respiratory complications will improve Outcome: Progressing   Problem: Nutrition: Goal: Adequate nutrition will be maintained Outcome: Progressing

## 2024-05-14 NOTE — Progress Notes (Signed)
 NAME:  Clarence Maldonado, MRN:  [id number], DOB:  1946-09-20, LOS: 2 ADMISSION DATE:  05/12/2024, CONSULTATION DATE:  8/8 REFERRING MD:  Dr. cottie, CHIEF COMPLAINT:  acute respiratory failure  History of Present Illness:  Patient is a 78 yo M w/ pertinent PMH COPD, CDK 3b, DMT2, HTN, HLD, BPH, R AKA, anxiety/depression presents to [facility] on 8/8 acute respiratory failure.  Patient resides at rehab facility. Having sob on 8/8 and prescribed rocephin /azithro. Worsening sob. Ems called found in severe resp distress unresponsive. LMA placed and being bagged en route to [facility] ED. Wheezing b/l. Patient intubated on arrival. Bp became soft post intubation requiring levo. Vbg 7.22, 64, 41, 26. Labs pending. Given nebs, iv steroids, mag. Pccm consulted for icu admission.  Prior echo in 2021 with EF of 30 to 35%. History of COPD.  On triple therapy.  No recent hospitalization for COPD.   Pertinent  Medical History   Past Medical History:  Diagnosis Date   Abnormal CT of the chest 2009   nodule 9 x 6 mm RUL   Benign localized hyperplasia of prostate with urinary obstruction and other lower urinary tract symptoms (LUTS)(600.21)    Cardiomegaly    Cerebrovascular accident (stroke) (HCC) 2009   lacunar infarct in the left thalamus   Cholelithiasis    Chronic airway obstruction, not elsewhere classified    Chronic kidney disease, stage III (moderate) (HCC)    Closed femur fracture (HCC) 1960   Contact with or exposure to viral disease    suspected    Illiterate    Obesity, unspecified    Other and unspecified hyperlipidemia    Special screening examination for respiratory tuberculosis    Tobacco use disorder    Tuberculosis    Type II or unspecified type diabetes mellitus with renal manifestations, uncontrolled(250.42)    Unspecified essential hypertension      Significant Hospital Events: Including procedures, antibiotic start and stop dates in addition to other pertinent events     Interim  History / Subjective:  Norepinephrine  off Currently doing PSV and tolerating but on propofol  20, fentanyl  25 I/O+ 2.3 L total SCr 2.07 > 2.22 WBC 18.4  Objective    Blood pressure 103/60, pulse (!) 103, temperature 97.9 F (36.6 C), resp. rate 13, height 5' 5 (1.651 m), weight 60.5 kg, SpO2 100%.    Vent Mode: PSV;CPAP FiO2 (%):  [40 %] 40 % Set Rate:  [12 bmp] 12 bmp Vt Set:  [510 mL] 510 mL PEEP:  [5 cmH20] 5 cmH20 Pressure Support:  [10 cmH20] 10 cmH20 Plateau Pressure:  [12 cmH20-22 cmH20] 12 cmH20   Intake/Output Summary (Last 24 hours) at 05/14/2024 0933 Last data filed at 05/14/2024 0800 Gross per 24 hour  Intake 416.9 ml  Output 725 ml  Net -308.1 ml   Filed Weights   05/12/24 2000 05/13/24 0416 05/14/24 0500  Weight: 61.9 kg 63.1 kg 60.5 kg    Examination: General: Thin ill-appearing man, ventilated HENT: ET tube in good position, oropharynx otherwise clear.  Minimal secretions Lungs: Distant but decent air movement, no wheezes on PSV Cardiovascular: Regular, no murmur Abdomen: Nondistended with positive bowel sounds Extremities: Right AKA.  No edema Neuro: Pupils are equal.  He does attempt to follow commands, stick out his tongue.  Tolerating PSV without stimulation (better than 8/9)  Resolved problem list   Assessment and Plan   Acute respiratory failure w/ hypercapnia COPD exacerbation R/O infection, chest x-ray reassuring -PRVC 8 cc/kg.  Will liberalize  VT, decrease RR if he shows evidence for auto peeping - Tolerating PSV currently but on sedation.  Working towards possible extubation as wheezing has improved but will need to wean his sedation 8/10.  Consider transition to Precedex  if agitation increases his work of breathing - No clear evidence for pneumonia on chest x-ray.  Repeat procalcitonin on 8/11 and tailor antibiotics.  Currently ceftriaxone  without atypical coverage.  Once extubated may be able to switch to doxycycline  to finish full course -  Pneumococcal antigen negative, Legionella pending - Scheduled Solu-Medrol  - Scheduled DuoNeb and Pulmicort  neb - Albuterol  neb available if needed - VAP prevention order set in place  Acute encephalopathy, likely metabolic and also sedating medications -Reassuring head CT - Minimize sedation as above.  Considering a change to Precedex  if he does not tolerate coming off propofol  without respiratory distress - Hypercapnia improved, ABG if there is a clinical change  Likely sedation induced hypotension: Auto PEEP: -Vent strategy as above - Norepinephrine  is off  AKI on CKD: Anion gap metabolic acidosis, slightly elevated lactate -Follow urine output, SCr slight increase on 8/10.  Consider gentle volume resuscitation  DMT2 - Amaryl  and Farxiga  are both on hold - Managing with sliding scale insulin   HTN: - Home metoprolol  and Lasix  are on hold as his hemodynamics normalized  HLD -Continue statin  BPH -Flomax  on hold, reinitiate when able  anxiety/depression  - Restart home Wellbutrin  when taking p.o.  Hyperkalemia, resolved - Follow BMP    Best Practice (right click and Reselect all SmartList Selections daily)   Diet/type: NPO DVT prophylaxis SCD Pressure ulcer(s): N/A GI prophylaxis: H2B Lines: N/A Foley:  Yes, and it is still needed Code Status:  full code Last date of multidisciplinary goals of care discussion [friend/roommate Norleen was informed and plan discussed]  Labs   CBC: Recent Labs  Lab 05/12/24 1756 05/12/24 1807 05/14/24 0231  WBC 14.9*  --  18.4*  NEUTROABS 13.3*  --   --   HGB 14.3 16.7  16.3 13.0  HCT 49.5 49.0  48.0 43.5  MCV 94.1  --  90.2  PLT 193  --  184    Basic Metabolic Panel: Recent Labs  Lab 05/12/24 1756 05/12/24 1807 05/13/24 0250 05/14/24 0231  NA 138 139  139 137 141  K 5.4* 5.4*  5.4* 4.5 4.3  CL 99 106 104 105  CO2 27  --  18* 24  GLUCOSE 109* 104* 282* 141*  BUN 34* 40* 46* 55*  CREATININE 1.98* 2.00*  2.07* 2.22*  CALCIUM  8.5*  --  8.2* 8.7*  MG  --   --   --  3.1*  PHOS  --   --   --  5.5*   GFR: Estimated Creatinine Clearance: 23.8 mL/min (A) (by C-G formula based on SCr of 2.22 mg/dL (H)). Recent Labs  Lab 05/12/24 1756 05/12/24 1813 05/12/24 1929 05/12/24 1930 05/12/24 2031 05/13/24 0250 05/14/24 0231  PROCALCITON  --   --   --  0.63  --   --   --   WBC 14.9*  --   --   --   --   --  18.4*  LATICACIDVEN  --  1.0 1.6  --  2.7* 1.7  --     Liver Function Tests: Recent Labs  Lab 05/12/24 1756 05/13/24 0250  AST 22 22  ALT 12 11  ALKPHOS 78 70  BILITOT 0.3 1.0  PROT 7.0 6.1*  ALBUMIN 2.6* 2.2*   No  results for input(s): LIPASE, AMYLASE in the last 168 hours. Recent Labs  Lab 05/12/24 1930  AMMONIA 60*    ABG    Component Value Date/Time   PHART 7.31 (L) 05/12/2024 1941   PCO2ART 38 05/12/2024 1941   PO2ART 340 (H) 05/12/2024 1941   HCO3 19.1 (L) 05/12/2024 1941   TCO2 26 05/12/2024 1807   TCO2 28 05/12/2024 1807   ACIDBASEDEF 6.6 (H) 05/12/2024 1941   O2SAT 100 05/12/2024 1941     Coagulation Profile: Recent Labs  Lab 05/12/24 1756  INR 1.1    Cardiac Enzymes: No results for input(s): CKTOTAL, CKMB, CKMBINDEX, TROPONINI in the last 168 hours.  CBG: Recent Labs  Lab 05/13/24 1523 05/13/24 2005 05/13/24 2356 05/14/24 0459 05/14/24 0738  GLUCAP 169* 131* 121* 139* 125*     Critical care time: 32 minutes    Lamar Chris, MD, PhD 05/14/2024, 9:33 AM [facility] [phone] or if no answer before 7:00PM call [phone] For any issues after 7:00PM please call eLink [phone]

## 2024-05-14 NOTE — Progress Notes (Signed)
  Echocardiogram 2D Echocardiogram has been performed.  Clarence Maldonado 05/14/2024, 9:30 AM

## 2024-05-14 NOTE — H&P (Signed)
 PHARMACY - PHYSICIAN COMMUNICATION CRITICAL VALUE ALERT - BLOOD CULTURE IDENTIFICATION (BCID)  Clarence Maldonado is an 78 y.o. male who presented from rehab to [facility] on 05/12/2024 with a chief complaint of acute respiratory failure.  Assessment:  1 of 4 blood culture bottles with GPC. BCID is showing staphylococcus species, no resistance. Ruling out pneumonia vs COPD exacerbation. WBC 18.4. Afebrile.   Name of physician (or Provider) Contacted: Dr. Shelah  Current antibiotics: Ceftriaxone    Changes to prescribed antibiotics recommended:  Patient is on recommended antibiotics - No changes needed  Results for orders placed or performed during the hospital encounter of 05/12/24  Blood Culture ID Panel (Reflexed) (Collected: 05/12/2024  7:40 PM)  Result Value Ref Range   Enterococcus faecalis NOT DETECTED NOT DETECTED   Enterococcus Faecium NOT DETECTED NOT DETECTED   Listeria monocytogenes NOT DETECTED NOT DETECTED   Staphylococcus species DETECTED (A) NOT DETECTED   Staphylococcus aureus (BCID) NOT DETECTED NOT DETECTED   Staphylococcus epidermidis NOT DETECTED NOT DETECTED   Staphylococcus lugdunensis NOT DETECTED NOT DETECTED   Streptococcus species NOT DETECTED NOT DETECTED   Streptococcus agalactiae NOT DETECTED NOT DETECTED   Streptococcus pneumoniae NOT DETECTED NOT DETECTED   Streptococcus pyogenes NOT DETECTED NOT DETECTED   A.calcoaceticus-baumannii NOT DETECTED NOT DETECTED   Bacteroides fragilis NOT DETECTED NOT DETECTED   Enterobacterales NOT DETECTED NOT DETECTED   Enterobacter cloacae complex NOT DETECTED NOT DETECTED   Escherichia coli NOT DETECTED NOT DETECTED   Klebsiella aerogenes NOT DETECTED NOT DETECTED   Klebsiella oxytoca NOT DETECTED NOT DETECTED   Klebsiella pneumoniae NOT DETECTED NOT DETECTED   Proteus species NOT DETECTED NOT DETECTED   Salmonella species NOT DETECTED NOT DETECTED   Serratia marcescens NOT DETECTED NOT DETECTED   Haemophilus influenzae  NOT DETECTED NOT DETECTED   Neisseria meningitidis NOT DETECTED NOT DETECTED   Pseudomonas aeruginosa NOT DETECTED NOT DETECTED   Stenotrophomonas maltophilia NOT DETECTED NOT DETECTED   Candida albicans NOT DETECTED NOT DETECTED   Candida auris NOT DETECTED NOT DETECTED   Candida glabrata NOT DETECTED NOT DETECTED   Candida krusei NOT DETECTED NOT DETECTED   Candida parapsilosis NOT DETECTED NOT DETECTED   Candida tropicalis NOT DETECTED NOT DETECTED   Cryptococcus neoformans/gattii NOT DETECTED NOT DETECTED    Clarence Maldonado 05/14/2024  8:25 AM

## 2024-05-15 ENCOUNTER — Inpatient Hospital Stay (HOSPITAL_COMMUNITY)

## 2024-05-15 DIAGNOSIS — J9690 Respiratory failure, unspecified, unspecified whether with hypoxia or hypercapnia: Secondary | ICD-10-CM | POA: Diagnosis not present

## 2024-05-15 DIAGNOSIS — I10 Essential (primary) hypertension: Secondary | ICD-10-CM | POA: Diagnosis not present

## 2024-05-15 DIAGNOSIS — E785 Hyperlipidemia, unspecified: Secondary | ICD-10-CM

## 2024-05-15 DIAGNOSIS — J441 Chronic obstructive pulmonary disease with (acute) exacerbation: Secondary | ICD-10-CM | POA: Diagnosis not present

## 2024-05-15 DIAGNOSIS — E119 Type 2 diabetes mellitus without complications: Secondary | ICD-10-CM | POA: Diagnosis not present

## 2024-05-15 DIAGNOSIS — G9340 Encephalopathy, unspecified: Secondary | ICD-10-CM | POA: Diagnosis not present

## 2024-05-15 DIAGNOSIS — Z4682 Encounter for fitting and adjustment of non-vascular catheter: Secondary | ICD-10-CM | POA: Diagnosis not present

## 2024-05-15 DIAGNOSIS — N179 Acute kidney failure, unspecified: Secondary | ICD-10-CM | POA: Diagnosis not present

## 2024-05-15 DIAGNOSIS — J9601 Acute respiratory failure with hypoxia: Secondary | ICD-10-CM

## 2024-05-15 DIAGNOSIS — N189 Chronic kidney disease, unspecified: Secondary | ICD-10-CM

## 2024-05-15 DIAGNOSIS — A419 Sepsis, unspecified organism: Secondary | ICD-10-CM

## 2024-05-15 LAB — CBC
HCT: 44 % (ref 39.0–52.0)
Hemoglobin: 13.3 g/dL (ref 13.0–17.0)
MCH: 27.2 pg (ref 26.0–34.0)
MCHC: 30.2 g/dL (ref 30.0–36.0)
MCV: 90 fL (ref 80.0–100.0)
Platelets: 173 K/uL (ref 150–400)
RBC: 4.89 MIL/uL (ref 4.22–5.81)
RDW: 15.7 % — ABNORMAL HIGH (ref 11.5–15.5)
WBC: 13.7 K/uL — ABNORMAL HIGH (ref 4.0–10.5)
nRBC: 0 % (ref 0.0–0.2)

## 2024-05-15 LAB — GLUCOSE, CAPILLARY
Glucose-Capillary: 120 mg/dL — ABNORMAL HIGH (ref 70–99)
Glucose-Capillary: 145 mg/dL — ABNORMAL HIGH (ref 70–99)
Glucose-Capillary: 146 mg/dL — ABNORMAL HIGH (ref 70–99)
Glucose-Capillary: 169 mg/dL — ABNORMAL HIGH (ref 70–99)
Glucose-Capillary: 208 mg/dL — ABNORMAL HIGH (ref 70–99)
Glucose-Capillary: 244 mg/dL — ABNORMAL HIGH (ref 70–99)

## 2024-05-15 LAB — MAGNESIUM
Magnesium: 2.7 mg/dL — ABNORMAL HIGH (ref 1.7–2.4)
Magnesium: 2.8 mg/dL — ABNORMAL HIGH (ref 1.7–2.4)

## 2024-05-15 LAB — CULTURE, BLOOD (ROUTINE X 2): Culture  Setup Time: NO GROWTH

## 2024-05-15 LAB — BASIC METABOLIC PANEL WITH GFR
Anion gap: 11 (ref 5–15)
BUN: 60 mg/dL — ABNORMAL HIGH (ref 8–23)
CO2: 23 mmol/L (ref 22–32)
Calcium: 8.8 mg/dL — ABNORMAL LOW (ref 8.9–10.3)
Chloride: 110 mmol/L (ref 98–111)
Creatinine, Ser: 2.28 mg/dL — ABNORMAL HIGH (ref 0.61–1.24)
GFR, Estimated: 29 mL/min — ABNORMAL LOW (ref >60)
Glucose, Bld: 141 mg/dL — ABNORMAL HIGH (ref 70–99)
Potassium: 4.4 mmol/L (ref 3.5–5.1)
Sodium: 144 mmol/L (ref 135–145)

## 2024-05-15 LAB — LACTIC ACID, PLASMA: Lactic Acid, Venous: 0.8 mmol/L (ref 0.5–1.9)

## 2024-05-15 LAB — AMMONIA: Ammonia: 36 umol/L — ABNORMAL HIGH (ref 9–35)

## 2024-05-15 LAB — LEGIONELLA PNEUMOPHILA SEROGP 1 UR AG: L. pneumophila Serogp 1 Ur Ag: NEGATIVE

## 2024-05-15 LAB — PHOSPHORUS
Phosphorus: 4.2 mg/dL (ref 2.5–4.6)
Phosphorus: 3.8 mg/dL (ref 2.5–4.6)

## 2024-05-15 LAB — PROCALCITONIN: Procalcitonin: 0.89 ng/mL

## 2024-05-15 MED ORDER — FUROSEMIDE 10 MG/ML IJ SOLN
40.0000 mg | Freq: Once | INTRAMUSCULAR | Status: AC
  Administered 2024-05-15 (×2): 40 mg via INTRAVENOUS
  Filled 2024-05-15: qty 4

## 2024-05-15 MED ORDER — THIAMINE MONONITRATE 100 MG PO TABS
100.0000 mg | ORAL_TABLET | Freq: Every day | ORAL | Status: AC
  Administered 2024-05-15 – 2024-05-21 (×10): 100 mg
  Filled 2024-05-15 (×7): qty 1

## 2024-05-15 MED ORDER — OSMOLITE 1.2 CAL PO LIQD
1000.0000 mL | ORAL | Status: DC
  Administered 2024-05-15 – 2024-05-27 (×16): 1000 mL
  Filled 2024-05-15 (×24): qty 1000

## 2024-05-15 MED ORDER — PROSOURCE TF20 ENFIT COMPATIBL EN LIQD
60.0000 mL | Freq: Every day | ENTERAL | Status: DC
  Administered 2024-05-15 – 2024-05-23 (×12): 60 mL
  Filled 2024-05-15 (×9): qty 60

## 2024-05-15 NOTE — Progress Notes (Addendum)
 NAME:  Clarence Maldonado, MRN:  [id number], DOB:  [birth year], LOS: 3 ADMISSION DATE:  05/12/2024, CONSULTATION DATE:  8/8 REFERRING MD:  Dr. cottie, CHIEF COMPLAINT:  acute respiratory failure  History of Present Illness:  Patient is a 78 yo M w/ pertinent PMH COPD, CDK 3b, DMT2, HTN, HLD, BPH, R AKA, anxiety/depression presents to [facility] on 8/8 acute respiratory failure.  Patient resides at rehab facility. Having sob on 8/8 and prescribed rocephin /azithro. Worsening sob. Ems called found in severe resp distress unresponsive. LMA placed and being bagged en route to [facility] ED. Wheezing b/l. Patient intubated on arrival. Bp became soft post intubation requiring levo. Vbg 7.22, 64, 41, 26. Labs pending. Given nebs, iv steroids, mag. Pccm consulted for icu admission.  Prior echo in 2021 with EF of 30 to 35%. History of COPD.  On triple therapy.  No recent hospitalization for COPD.   Pertinent  Medical History   Past Medical History:  Diagnosis Date   Abnormal CT of the chest 2009   nodule 9 x 6 mm RUL   Benign localized hyperplasia of prostate with urinary obstruction and other lower urinary tract symptoms (LUTS)(600.21)    Cardiomegaly    Cerebrovascular accident (stroke) (HCC) 2009   lacunar infarct in the left thalamus   Cholelithiasis    Chronic airway obstruction, not elsewhere classified    Chronic kidney disease, stage III (moderate) (HCC)    Closed femur fracture (HCC) 1960   Contact with or exposure to viral disease    suspected    Illiterate    Obesity, unspecified    Other and unspecified hyperlipidemia    Special screening examination for respiratory tuberculosis    Tobacco use disorder    Tuberculosis    Type II or unspecified type diabetes mellitus with renal manifestations, uncontrolled(250.42)    Unspecified essential hypertension      Significant Hospital Events: Including procedures, antibiotic start and stop dates in addition to other pertinent events   8/8 admit w/  copd intubated  Interim History / Subjective:  Remains on vent; attempted sbt today and became sob/anxious and back on full support Per nurse following commands  Objective    Blood pressure 124/65, pulse 95, temperature 98.7 F (37.1 C), temperature source Axillary, resp. rate 17, height 5' 5 (1.651 m), weight 61.9 kg, SpO2 96%.    Vent Mode: PRVC FiO2 (%):  [40 %] 40 % Set Rate:  [12 bmp] 12 bmp Vt Set:  [490 mL-510 mL] 490 mL PEEP:  [5 cmH20] 5 cmH20 Pressure Support:  [10 cmH20] 10 cmH20 Plateau Pressure:  [13 cmH20-18 cmH20] 18 cmH20   Intake/Output Summary (Last 24 hours) at 05/15/2024 0734 Last data filed at 05/15/2024 0600 Gross per 24 hour  Intake 501.68 ml  Output 910 ml  Net -408.32 ml   Filed Weights   05/13/24 0416 05/14/24 0500 05/15/24 0500  Weight: 63.1 kg 60.5 kg 61.9 kg    Examination: General: critically ill appearing on mech vent HEENT: MM pink/moist; ETT in place Neuro: sedate but following commands per nurse earlier; perrl CV: s1s2, tachy 110s, no m/r/g PULM:  dim clear BS bilaterally; on mech vent PRVC GI: soft, bsx4 active  Extremities: warm/dry, no edema; r aka Skin: no rashes or lesions    Resolved problem list  Likely sedation induced hypotension: Assessment and Plan   Acute respiratory failure w/ hypercapnia COPD exacerbation Viral pneumonia: rvp metapneumovirus positive; pct elevated 0.8 possible bacterial component Plan: -lasix  this am; reattempt  sbt later today may need to increase precedex  during trial; if passes would consider extubate to bipap -LTVV strategy with tidal volumes of 6-8 cc/kg ideal body weight -Wean PEEP/FiO2 for SpO2 >92% -VAP bundle in place -Daily SAT and SBT -PAD protocol in place -wean sedation for RASS goal 0 to -1 -duoneb and pulmicort  scheduled -iv steroids -cpt -rocephin ; follow cultures  Acute encephalopathy, likely metabolic and also sedating medications -Reassuring head CT; likely hypercapnia  related Mild hyperammonemia Plan: -improving -limit sedating meds -delirium precautions -repeat ammonia; consider lactulose  Sepsis: likely 2/2 to pneumonia and possible uti w/ large leukocytes -Bc1/4 w/ staph; rvp positive for metapneumovirus Plan: -currently on rocephin  -follow cultures -trend wbc/fever curve; trach aspirate sent 8/8 no results; repeat due to increase secretions; may need to consider broadening abx  AKI on CKD: Anion gap metabolic acidosis, slightly elevated lactate Plan: -lasix  x1 today -Trend BMP / urinary output -Replace electrolytes as indicated -Avoid nephrotoxic agents, ensure adequate renal perfusion  DMT2 - Amaryl  and Farxiga  are both on hold Plan: -ssi and cbg monitoring  HTN: Plan: -hold home metoprolol  for now w/ soft bp  HLD Plan: -Continue statin  BPH Plan: -Flomax  on hold, reinitiate when able  anxiety/depression  Plan: - Restart home Wellbutrin  when taking p.o.  Hyperkalemia Plan: - improved - Follow BMP    Best Practice (right click and Reselect all SmartList Selections daily)   Diet/type: NPO; TF today DVT prophylaxis prophylactic heparin   Pressure ulcer(s): N/A GI prophylaxis: H2B Lines: N/A Foley:  Yes, and it is still needed Code Status:  full code Last date of multidisciplinary goals of care discussion [8/11 friend Norleen updated over phone]  Labs   CBC: Recent Labs  Lab 05/12/24 1756 05/12/24 1807 05/14/24 0231 05/15/24 0249  WBC 14.9*  --  18.4* 13.7*  NEUTROABS 13.3*  --   --   --   HGB 14.3 16.7  16.3 13.0 13.3  HCT 49.5 49.0  48.0 43.5 44.0  MCV 94.1  --  90.2 90.0  PLT 193  --  184 173    Basic Metabolic Panel: Recent Labs  Lab 05/12/24 1756 05/12/24 1807 05/13/24 0250 05/14/24 0231 05/15/24 0249  NA 138 139  139 137 141 144  K 5.4* 5.4*  5.4* 4.5 4.3 4.4  CL 99 106 104 105 110  CO2 27  --  18* 24 23  GLUCOSE 109* 104* 282* 141* 141*  BUN 34* 40* 46* 55* 60*  CREATININE 1.98*  2.00* 2.07* 2.22* 2.28*  CALCIUM  8.5*  --  8.2* 8.7* 8.8*  MG  --   --   --  3.1* 2.7*  PHOS  --   --   --  5.5* 4.2   GFR: Estimated Creatinine Clearance: 23.6 mL/min (A) (by C-G formula based on SCr of 2.28 mg/dL (H)). Recent Labs  Lab 05/12/24 1756 05/12/24 1813 05/12/24 1929 05/12/24 1930 05/12/24 2031 05/13/24 0250 05/14/24 0231 05/15/24 0249  PROCALCITON  --   --   --  0.63  --   --   --  0.89  WBC 14.9*  --   --   --   --   --  18.4* 13.7*  LATICACIDVEN  --    < > 1.6  --  2.7* 1.7  --  0.8   < > = values in this interval not displayed.    Liver Function Tests: Recent Labs  Lab 05/12/24 1756 05/13/24 0250  AST 22 22  ALT 12 11  ALKPHOS 78 70  BILITOT 0.3 1.0  PROT 7.0 6.1*  ALBUMIN 2.6* 2.2*   No results for input(s): LIPASE, AMYLASE in the last 168 hours. Recent Labs  Lab 05/12/24 1930  AMMONIA 60*    ABG    Component Value Date/Time   PHART 7.31 (L) 05/12/2024 1941   PCO2ART 38 05/12/2024 1941   PO2ART 340 (H) 05/12/2024 1941   HCO3 19.1 (L) 05/12/2024 1941   TCO2 26 05/12/2024 1807   TCO2 28 05/12/2024 1807   ACIDBASEDEF 6.6 (H) 05/12/2024 1941   O2SAT 100 05/12/2024 1941     Coagulation Profile: Recent Labs  Lab 05/12/24 1756  INR 1.1    Cardiac Enzymes: No results for input(s): CKTOTAL, CKMB, CKMBINDEX, TROPONINI in the last 168 hours.  CBG: Recent Labs  Lab 05/14/24 1519 05/14/24 1944 05/14/24 2346 05/15/24 0332 05/15/24 0728  GLUCAP 134* 139* 151* 120* 145*     Critical care time: 35 minutes    JD Emilio RIGGERS [facility] 05/15/2024, 8:16 AM  Please see [email or web address] for pager details.  From 7A-7P if no response, please call [phone]. After hours, please call ELink [phone].

## 2024-05-15 NOTE — Progress Notes (Signed)
 Initial Nutrition Assessment  DOCUMENTATION CODES:  Severe malnutrition in context of chronic illness  INTERVENTION:  Initiate tube feeding via Cortrak: Osmolite 1.2 at 55 ml/h (1320 ml per day) *Start at 25ml and advance by 10ml q8h to goal rate  Prosource TF20 60 ml once daily Provides 1664 kcal, 93 gm protein, 1082 ml free water  daily  Monitor magnesium  and phosphorus daily x 4 occurrences, MD to replete as needed, as pt is at risk for refeeding syndrome given severe malnutrition and significant weight loss.  Thiamine  100mg  daily x 7days   NUTRITION DIAGNOSIS:  Severe Malnutrition related to chronic illness (COPD) as evidenced by severe muscle depletion, severe fat depletion.  GOAL:  Patient will meet greater than or equal to 90% of their needs  MONITOR:  Vent status, Labs, Weight trends, TF tolerance  REASON FOR ASSESSMENT:  Ventilator, Consult Assessment of nutrition requirement/status  ASSESSMENT:  Pt admitted from rehab with worsening shortness of breath, found to have severe respiratory distress and unresponsive. PMH significant for COPD, CKD 3b, T2DM, HTN, HLD, right AKA, anxiety/depression.  8/8: admitted with COPD exacerbation, intubated 8/11: Cortrak placement (tip in the duodenum)  Patient is currently intubated on ventilator support MV: 7.4 L/min Temp (24hrs), Avg:100 F (37.8 C), Min:98.6 F (37 C), Max:101.1 F (38.4 C)  No family/visitors present at time of visit. Unable to obtain detailed nutrition related history at this time.   Patient discussed in interdisciplinary rounds.  Did not tolerate vent weaning this morning.  Increased secretion, on chest PT.   Plan to initiate tube feeding today.  Has not had BM since admission. Abdomen soft.  On bowel regimen.   Admit weight: 61.9 kg Current weight: 61.9 kg Since January, pt's weight has declined 11.9% which is clinically significant for time frame.   Medications: colace BID, pepcid , SSI 0-9  units q4h, solu-medrol , miralax  daily  Labs:  BUN 60 Cr 2.28 Mg 2.8 GFR 29 CBG's 120-151 x24 hours  NUTRITION - FOCUSED PHYSICAL EXAM: Flowsheet Row Most Recent Value  Orbital Region Severe depletion  Upper Arm Region Severe depletion  Thoracic and Lumbar Region Severe depletion  Buccal Region Unable to assess  Temple Region Severe depletion  Clavicle Bone Region Severe depletion  Clavicle and Acromion Bone Region Severe depletion  Scapular Bone Region Severe depletion  Dorsal Hand Unable to assess  [in handmits]  Patellar Region Severe depletion  [right AKA]  Anterior Thigh Region Severe depletion  Posterior Calf Region Severe depletion  Edema (RD Assessment) None  Hair Reviewed  Eyes Unable to assess  Mouth Unable to assess  Skin Reviewed  Nails Unable to assess    Diet Order:   Diet Order             Diet NPO time specified  Diet effective now                   EDUCATION NEEDS:  No education needs have been identified at this time  Skin:  Skin Assessment: Reviewed RN Assessment  Last BM:  unknown/PTA  Height:  Ht Readings from Last 1 Encounters:  05/12/24 5' 5 (1.651 m)    Weight:  Wt Readings from Last 1 Encounters:  05/15/24 61.9 kg   BMI:  Body mass index is 22.71 kg/m.  Estimated Nutritional Needs:   Kcal:  1600-1800  Protein:  85-100g  Fluid:  >/=1.6L  Clarence Maldonado, RDN, LDN Clinical Nutrition See AMiON for contact information.

## 2024-05-15 NOTE — Procedures (Signed)
 Cortrak  Person Inserting Tube:  Mady Dolly, RD Tube Type:  Cortrak - 55 inches Tube Size:  10 Tube Location:  Left nare Secured by: Bridle Technique Used to Measure Tube Placement:  Marking at nare/corner of mouth Cortrak Secured At:  102 cm   Cortrak Tube Team Note:  Consult received to place a Cortrak feeding tube.   X-ray is required. RN may begin using tube s/p confirmation of placement.   If the tube becomes dislodged please keep the tube and contact the Cortrak team at [email or web address] for replacement.  If after hours and replacement cannot be delayed, place a NG tube and confirm placement with an abdominal x-ray.    Dolly Mady MS, RD, LDN Registered Dietitian Clinical Nutrition RD Inpatient Contact Info in Amion

## 2024-05-16 DIAGNOSIS — G9340 Encephalopathy, unspecified: Secondary | ICD-10-CM | POA: Diagnosis not present

## 2024-05-16 DIAGNOSIS — J189 Pneumonia, unspecified organism: Secondary | ICD-10-CM | POA: Diagnosis not present

## 2024-05-16 DIAGNOSIS — J441 Chronic obstructive pulmonary disease with (acute) exacerbation: Secondary | ICD-10-CM | POA: Diagnosis not present

## 2024-05-16 DIAGNOSIS — J9602 Acute respiratory failure with hypercapnia: Secondary | ICD-10-CM | POA: Diagnosis not present

## 2024-05-16 LAB — CBC
HCT: 44.6 % (ref 39.0–52.0)
Hemoglobin: 13.4 g/dL (ref 13.0–17.0)
MCH: 27 pg (ref 26.0–34.0)
MCHC: 30 g/dL (ref 30.0–36.0)
MCV: 89.9 fL (ref 80.0–100.0)
Platelets: 188 K/uL (ref 150–400)
RBC: 4.96 MIL/uL (ref 4.22–5.81)
RDW: 15.9 % — ABNORMAL HIGH (ref 11.5–15.5)
WBC: 15 K/uL — ABNORMAL HIGH (ref 4.0–10.5)
nRBC: 0 % (ref 0.0–0.2)

## 2024-05-16 LAB — BASIC METABOLIC PANEL WITH GFR
Anion gap: 10 (ref 5–15)
BUN: 73 mg/dL — ABNORMAL HIGH (ref 8–23)
CO2: 27 mmol/L (ref 22–32)
Calcium: 9 mg/dL (ref 8.9–10.3)
Chloride: 111 mmol/L (ref 98–111)
Creatinine, Ser: 2.03 mg/dL — ABNORMAL HIGH (ref 0.61–1.24)
GFR, Estimated: 33 mL/min — ABNORMAL LOW (ref >60)
Glucose, Bld: 228 mg/dL — ABNORMAL HIGH (ref 70–99)
Potassium: 4.1 mmol/L (ref 3.5–5.1)
Sodium: 148 mmol/L — ABNORMAL HIGH (ref 135–145)

## 2024-05-16 LAB — GLUCOSE, CAPILLARY
Glucose-Capillary: 180 mg/dL — ABNORMAL HIGH (ref 70–99)
Glucose-Capillary: 185 mg/dL — ABNORMAL HIGH (ref 70–99)
Glucose-Capillary: 186 mg/dL — ABNORMAL HIGH (ref 70–99)
Glucose-Capillary: 188 mg/dL — ABNORMAL HIGH (ref 70–99)
Glucose-Capillary: 208 mg/dL — ABNORMAL HIGH (ref 70–99)
Glucose-Capillary: 230 mg/dL — ABNORMAL HIGH (ref 70–99)

## 2024-05-16 LAB — TRIGLYCERIDES: Triglycerides: 162 mg/dL — ABNORMAL HIGH (ref <150)

## 2024-05-16 LAB — PHOSPHORUS: Phosphorus: 3.5 mg/dL (ref 2.5–4.6)

## 2024-05-16 MED ORDER — FAMOTIDINE 20 MG PO TABS
10.0000 mg | ORAL_TABLET | Freq: Every day | ORAL | Status: DC
  Administered 2024-05-16 – 2024-05-26 (×13): 10 mg
  Filled 2024-05-16 (×11): qty 1

## 2024-05-16 MED ORDER — INSULIN ASPART 100 UNIT/ML IJ SOLN
0.0000 [IU] | INTRAMUSCULAR | Status: DC
  Administered 2024-05-16 (×5): 3 [IU] via SUBCUTANEOUS
  Administered 2024-05-16: 5 [IU] via SUBCUTANEOUS
  Administered 2024-05-16: 3 [IU] via SUBCUTANEOUS
  Administered 2024-05-16 – 2024-05-17 (×2): 5 [IU] via SUBCUTANEOUS
  Administered 2024-05-17: 3 [IU] via SUBCUTANEOUS
  Administered 2024-05-17 (×4): 5 [IU] via SUBCUTANEOUS
  Administered 2024-05-17 (×2): 3 [IU] via SUBCUTANEOUS
  Administered 2024-05-17 (×3): 5 [IU] via SUBCUTANEOUS
  Administered 2024-05-17 – 2024-05-18 (×2): 3 [IU] via SUBCUTANEOUS
  Administered 2024-05-18: 8 [IU] via SUBCUTANEOUS
  Administered 2024-05-18: 3 [IU] via SUBCUTANEOUS
  Administered 2024-05-18 (×2): 8 [IU] via SUBCUTANEOUS
  Administered 2024-05-18: 5 [IU] via SUBCUTANEOUS
  Administered 2024-05-18: 3 [IU] via SUBCUTANEOUS
  Administered 2024-05-19 (×2): 5 [IU] via SUBCUTANEOUS
  Administered 2024-05-19: 8 [IU] via SUBCUTANEOUS
  Administered 2024-05-19: 5 [IU] via SUBCUTANEOUS
  Administered 2024-05-19: 3 [IU] via SUBCUTANEOUS
  Administered 2024-05-20: 2 [IU] via SUBCUTANEOUS
  Administered 2024-05-20: 8 [IU] via SUBCUTANEOUS
  Administered 2024-05-20: 5 [IU] via SUBCUTANEOUS
  Administered 2024-05-20: 8 [IU] via SUBCUTANEOUS
  Administered 2024-05-20: 5 [IU] via SUBCUTANEOUS
  Administered 2024-05-20: 2 [IU] via SUBCUTANEOUS
  Administered 2024-05-20: 3 [IU] via SUBCUTANEOUS
  Administered 2024-05-21: 15 [IU] via SUBCUTANEOUS
  Administered 2024-05-21: 11 [IU] via SUBCUTANEOUS
  Administered 2024-05-21: 15 [IU] via SUBCUTANEOUS
  Administered 2024-05-21: 3 [IU] via SUBCUTANEOUS
  Administered 2024-05-21 (×2): 5 [IU] via SUBCUTANEOUS
  Administered 2024-05-22: 8 [IU] via SUBCUTANEOUS
  Administered 2024-05-22: 15 [IU] via SUBCUTANEOUS
  Administered 2024-05-22: 8 [IU] via SUBCUTANEOUS
  Administered 2024-05-22 (×2): 5 [IU] via SUBCUTANEOUS
  Administered 2024-05-22 – 2024-05-23 (×2): 8 [IU] via SUBCUTANEOUS
  Administered 2024-05-23 (×2): 5 [IU] via SUBCUTANEOUS
  Administered 2024-05-23 (×2): 3 [IU] via SUBCUTANEOUS
  Administered 2024-05-23: 5 [IU] via SUBCUTANEOUS
  Administered 2024-05-24 (×2): 2 [IU] via SUBCUTANEOUS
  Filled 2024-05-16: qty 10

## 2024-05-16 MED ORDER — SODIUM CHLORIDE 0.9 % IV SOLN
2.0000 g | INTRAVENOUS | Status: DC
  Administered 2024-05-17 – 2024-05-18 (×3): 2 g via INTRAVENOUS
  Filled 2024-05-16 (×2): qty 12.5

## 2024-05-16 MED ORDER — PREDNISONE 20 MG PO TABS
40.0000 mg | ORAL_TABLET | Freq: Every day | ORAL | Status: DC
Start: 2024-05-16 — End: 2024-05-18
  Administered 2024-05-16 – 2024-05-18 (×5): 40 mg
  Filled 2024-05-16 (×3): qty 2

## 2024-05-16 MED ORDER — FREE WATER
50.0000 mL | Status: DC
  Administered 2024-05-16 – 2024-05-17 (×14): 50 mL

## 2024-05-16 MED ORDER — SODIUM CHLORIDE 0.9 % IV SOLN
2.0000 g | Freq: Three times a day (TID) | INTRAVENOUS | Status: DC
  Administered 2024-05-16 (×2): 2 g via INTRAVENOUS
  Filled 2024-05-16: qty 12.5

## 2024-05-16 MED ORDER — REVEFENACIN 175 MCG/3ML IN SOLN
175.0000 ug | Freq: Every day | RESPIRATORY_TRACT | Status: DC
  Administered 2024-05-16 – 2024-06-08 (×25): 175 ug via RESPIRATORY_TRACT
  Filled 2024-05-16 (×24): qty 3

## 2024-05-16 MED ORDER — ARFORMOTEROL TARTRATE 15 MCG/2ML IN NEBU
15.0000 ug | INHALATION_SOLUTION | Freq: Two times a day (BID) | RESPIRATORY_TRACT | Status: DC
  Administered 2024-05-16 – 2024-05-17 (×6): 15 ug via RESPIRATORY_TRACT
  Filled 2024-05-16 (×3): qty 2

## 2024-05-16 MED ORDER — METOPROLOL TARTRATE 12.5 MG HALF TABLET
12.5000 mg | ORAL_TABLET | Freq: Two times a day (BID) | ORAL | Status: DC
  Administered 2024-05-16 – 2024-05-20 (×13): 12.5 mg
  Filled 2024-05-16 (×10): qty 1

## 2024-05-16 NOTE — Progress Notes (Signed)
 NAME:  Clarence Maldonado, MRN:  [id number], DOB:  April 23, 1946, LOS: 4 ADMISSION DATE:  05/12/2024, CONSULTATION DATE:  8/8 REFERRING MD:  Dr. cottie, CHIEF COMPLAINT:  acute respiratory failure  History of Present Illness:  Patient is a 78 yo M w/ pertinent PMH COPD, CDK 3b, DMT2, HTN, HLD, BPH, R AKA, anxiety/depression presents to [facility] on 8/8 acute respiratory failure.  Patient resides at rehab facility. Having sob on 8/8 and prescribed rocephin /azithro. Worsening sob. Ems called found in severe resp distress unresponsive. LMA placed and being bagged en route to [facility] ED. Wheezing b/l. Patient intubated on arrival. Bp became soft post intubation requiring levo. Vbg 7.22, 64, 41, 26. Labs pending. Given nebs, iv steroids, mag. Pccm consulted for icu admission.  Prior echo in 2021 with EF of 30 to 35%. History of COPD.  On triple therapy.  No recent hospitalization for COPD.   Pertinent  Medical History   Past Medical History:  Diagnosis Date   Abnormal CT of the chest 2009   nodule 9 x 6 mm RUL   Benign localized hyperplasia of prostate with urinary obstruction and other lower urinary tract symptoms (LUTS)(600.21)    Cardiomegaly    Cerebrovascular accident (stroke) (HCC) 2009   lacunar infarct in the left thalamus   Cholelithiasis    Chronic airway obstruction, not elsewhere classified    Chronic kidney disease, stage III (moderate) (HCC)    Closed femur fracture (HCC) 1960   Contact with or exposure to viral disease    suspected    Illiterate    Obesity, unspecified    Other and unspecified hyperlipidemia    Special screening examination for respiratory tuberculosis    Tobacco use disorder    Tuberculosis    Type II or unspecified type diabetes mellitus with renal manifestations, uncontrolled(250.42)    Unspecified essential hypertension      Significant Hospital Events: Including procedures, antibiotic start and stop dates in addition to other pertinent events   8/8 admit w/  copd intubated  Interim History / Subjective:  On vent sbt Having thick secretions in ETT Opens eyes to voice but not following commands; off sedation   Objective    Blood pressure 131/79, pulse (!) 101, temperature 100.2 F (37.9 C), resp. rate 16, height 5' 5 (1.651 m), weight 61.9 kg, SpO2 99%.    Vent Mode: PRVC FiO2 (%):  [40 %] 40 % Set Rate:  [12 bmp] 12 bmp Vt Set:  [490 mL] 490 mL PEEP:  [5 cmH20] 5 cmH20 Pressure Support:  [10 cmH20] 10 cmH20 Plateau Pressure:  [15 cmH20-18 cmH20] 15 cmH20   Intake/Output Summary (Last 24 hours) at 05/16/2024 9278 Last data filed at 05/16/2024 0600 Gross per 24 hour  Intake 866.22 ml  Output 2265 ml  Net -1398.78 ml   Filed Weights   05/14/24 0500 05/15/24 0500 05/16/24 0500  Weight: 60.5 kg 61.9 kg 61.9 kg    Examination: General: critically ill appearing on mech vent HEENT: MM pink/moist; ETT in place Neuro: off sedation; opens eyes to voice but not following commands CV: s1s2, tachy 120s, no m/r/g PULM:  dim clear BS bilaterally; on mech vent PRVC GI: soft, bsx4 active  Extremities: warm/dry, no edema; r aka    Resolved problem list  Likely sedation induced hypotension: Assessment and Plan   Acute respiratory failure w/ hypercapnia COPD exacerbation Viral pneumonia: rvp metapneumovirus positive; pct elevated 0.8 possible bacterial component Plan: -mental status and increased secretions precluding extubation -rest on  PRVC LTVV strategy with tidal volumes of 6-8 cc/kg ideal body weight -Wean PEEP/FiO2 for SpO2 >92% -VAP bundle in place -Daily SAT and SBT -PAD protocol in place -wean sedation for RASS goal 0 to -1 -change duoneb to brovana /yupelri ; cont pulmicort   -wean solumedrol to prednisone  -cpt -rocephin ; follow cultures  Acute encephalopathy, likely metabolic and also sedating medications -Reassuring head CT; likely hypercapnia/sepsis related Mild hyperammonemia Plan: -limit sedating meds -delirium  precautions -ammonia stable 36  Sepsis: likely 2/2 to pneumonia and possible uti w/ large leukocytes -Bc1/4 w/ staph; rvp positive for metapneumovirus Plan: -currently on rocephin  -follow cultures -trend wbc/fever curve; follow trach aspirate 8/11  AKI on CKD: Anion gap metabolic acidosis, slightly elevated lactate Plan: -Trend BMP / urinary output -Replace electrolytes as indicated -Avoid nephrotoxic agents, ensure adequate renal perfusion  DMT2 - Amaryl  and Farxiga  are both on hold Plan: -increase ssi and cont cbg monitoring  HTN: Plan: -hold home metoprolol  for now w/ soft bp  HLD Plan: -Continue statin  BPH Plan: -Flomax  on hold, reinitiate when able  anxiety/depression  Plan: - Restart home Wellbutrin  when taking p.o.  Hyperkalemia Plan: - improved - Follow BMP    Best Practice (right click and Reselect all SmartList Selections daily)   Diet/type: tubefeeds DVT prophylaxis prophylactic heparin   Pressure ulcer(s): N/A GI prophylaxis: H2B Lines: N/A Foley:  Yes, and it is still needed Code Status:  full code Last date of multidisciplinary goals of care discussion [8/11 friend Norleen updated over phone]  Labs   CBC: Recent Labs  Lab 05/12/24 1756 05/12/24 1807 05/14/24 0231 05/15/24 0249 05/16/24 0542  WBC 14.9*  --  18.4* 13.7* 15.0*  NEUTROABS 13.3*  --   --   --   --   HGB 14.3 16.7  16.3 13.0 13.3 13.4  HCT 49.5 49.0  48.0 43.5 44.0 44.6  MCV 94.1  --  90.2 90.0 89.9  PLT 193  --  184 173 188    Basic Metabolic Panel: Recent Labs  Lab 05/12/24 1756 05/12/24 1807 05/13/24 0250 05/14/24 0231 05/15/24 0249 05/15/24 1556 05/16/24 0542  NA 138 139  139 137 141 144  --  148*  K 5.4* 5.4*  5.4* 4.5 4.3 4.4  --  4.1  CL 99 106 104 105 110  --  111  CO2 27  --  18* 24 23  --  27  GLUCOSE 109* 104* 282* 141* 141*  --  228*  BUN 34* 40* 46* 55* 60*  --  73*  CREATININE 1.98* 2.00* 2.07* 2.22* 2.28*  --  2.03*  CALCIUM  8.5*  --   8.2* 8.7* 8.8*  --  9.0  MG  --   --   --  3.1* 2.8*  2.7*  --   --   PHOS  --   --   --  5.5* 4.2 3.8 3.5   GFR: Estimated Creatinine Clearance: 26.5 mL/min (A) (by C-G formula based on SCr of 2.03 mg/dL (H)). Recent Labs  Lab 05/12/24 1756 05/12/24 1813 05/12/24 1929 05/12/24 1930 05/12/24 2031 05/13/24 0250 05/14/24 0231 05/15/24 0249 05/16/24 0542  PROCALCITON  --   --   --  0.63  --   --   --  0.89  --   WBC 14.9*  --   --   --   --   --  18.4* 13.7* 15.0*  LATICACIDVEN  --    < > 1.6  --  2.7* 1.7  --  0.8  --    < > =  values in this interval not displayed.    Liver Function Tests: Recent Labs  Lab 05/12/24 1756 05/13/24 0250  AST 22 22  ALT 12 11  ALKPHOS 78 70  BILITOT 0.3 1.0  PROT 7.0 6.1*  ALBUMIN 2.6* 2.2*   No results for input(s): LIPASE, AMYLASE in the last 168 hours. Recent Labs  Lab 05/12/24 1930 05/15/24 1404  AMMONIA 60* 36*    ABG    Component Value Date/Time   PHART 7.31 (L) 05/12/2024 1941   PCO2ART 38 05/12/2024 1941   PO2ART 340 (H) 05/12/2024 1941   HCO3 19.1 (L) 05/12/2024 1941   TCO2 26 05/12/2024 1807   TCO2 28 05/12/2024 1807   ACIDBASEDEF 6.6 (H) 05/12/2024 1941   O2SAT 100 05/12/2024 1941     Coagulation Profile: Recent Labs  Lab 05/12/24 1756  INR 1.1    Cardiac Enzymes: No results for input(s): CKTOTAL, CKMB, CKMBINDEX, TROPONINI in the last 168 hours.  CBG: Recent Labs  Lab 05/15/24 1100 05/15/24 1520 05/15/24 1933 05/15/24 2338 05/16/24 0330  GLUCAP 146* 169* 244* 208* 185*     Critical care time: 35 minutes    JD Emilio RIGGERS [facility] 05/16/2024, 7:21 AM  Please see [email or web address] for pager details.  From 7A-7P if no response, please call [phone]. After hours, please call ELink [phone].

## 2024-05-16 NOTE — Progress Notes (Signed)
 Pseudomonas seen on trach aspirate. Rocephin  changed to cefepime .  JD Emilio RIGGERS [facility] 05/16/2024, 2:50 PM  Please see [email or web address] for pager details.  From 7A-7P if no response, please call [phone]. After hours, please call ELink [phone].

## 2024-05-16 NOTE — Plan of Care (Signed)
  Problem: Activity: Goal: Ability to tolerate increased activity will improve Outcome: Progressing   Problem: Clinical Measurements: Goal: Ability to maintain clinical measurements within normal limits will improve Outcome: Progressing Goal: Diagnostic test results will improve Outcome: Progressing Goal: Cardiovascular complication will be avoided Outcome: Progressing

## 2024-05-16 NOTE — Progress Notes (Signed)
 Pharmacy Renal Adjustment Note  Famotidine  20 mg daily adjusted to 10 mg daily for decreased renal function per protocol.   Maurilio Patten, PharmD PGY1 Pharmacy Resident [facility] 05/16/2024 7:53 AM

## 2024-05-16 NOTE — Plan of Care (Signed)
  Problem: Activity: Goal: Ability to tolerate increased activity will improve Outcome: Progressing   Problem: Education: Goal: Knowledge of General Education information will improve Description: Including pain rating scale, medication(s)/side effects and non-pharmacologic comfort measures Outcome: Not Progressing

## 2024-05-16 NOTE — Progress Notes (Signed)
 PHARMACY NOTE:  ANTIMICROBIAL RENAL DOSAGE ADJUSTMENT  Current antimicrobial regimen includes a mismatch between antimicrobial dosage and estimated renal function.  As per policy approved by the Pharmacy & Therapeutics and Medical Executive Committees, the antimicrobial dosage will be adjusted accordingly.  Current antimicrobial dosage:  Cefepime  2gm IV q8h  Indication: pseudomonas pna  Renal Function:  Estimated Creatinine Clearance: 26.5 mL/min (A) (by C-G formula based on SCr of 2.03 mg/dL (H)).    Antimicrobial dosage has been changed to:  Cefepime  2gm IV q24h   Thank you for allowing pharmacy to be a part of this patient's care.  Vito Ralph, PharmD, BCPS Please see amion for complete clinical pharmacist phone list 05/16/2024 5:45 PM

## 2024-05-16 NOTE — TOC CM/SW Note (Signed)
.  Transition of Care [facility]) - Inpatient Brief Assessment   Patient Details  Name: Clarence Maldonado MRN: [id number] Date of Birth: 1946-03-08  Transition of Care [facility]) CM/SW Contact:    Justina Delcia Czar, RN Phone Number: [phone] 05/16/2024, 9:18 AM   Clinical Narrative: Patient currently from [facility] ICM/CSW will follow for as patient progresses for dc needs.    Transition of Care Asessment: Insurance and Status: Insurance coverage has been reviewed Patient has primary care physician: Yes Home environment has been reviewed: [facility] Prior level of function:: needed assistance   Social Drivers of Health Review: SDOH reviewed no interventions necessary Readmission risk has been reviewed: Yes Transition of care needs: transition of care needs identified, TOC will continue to follow

## 2024-05-17 ENCOUNTER — Inpatient Hospital Stay (HOSPITAL_COMMUNITY)

## 2024-05-17 DIAGNOSIS — N1832 Chronic kidney disease, stage 3b: Secondary | ICD-10-CM

## 2024-05-17 DIAGNOSIS — N179 Acute kidney failure, unspecified: Secondary | ICD-10-CM | POA: Diagnosis not present

## 2024-05-17 DIAGNOSIS — G9340 Encephalopathy, unspecified: Secondary | ICD-10-CM | POA: Diagnosis not present

## 2024-05-17 DIAGNOSIS — I959 Hypotension, unspecified: Secondary | ICD-10-CM

## 2024-05-17 DIAGNOSIS — A419 Sepsis, unspecified organism: Secondary | ICD-10-CM | POA: Diagnosis not present

## 2024-05-17 DIAGNOSIS — E119 Type 2 diabetes mellitus without complications: Secondary | ICD-10-CM | POA: Diagnosis not present

## 2024-05-17 DIAGNOSIS — E870 Hyperosmolality and hypernatremia: Secondary | ICD-10-CM | POA: Diagnosis not present

## 2024-05-17 DIAGNOSIS — J9601 Acute respiratory failure with hypoxia: Secondary | ICD-10-CM | POA: Diagnosis not present

## 2024-05-17 DIAGNOSIS — J441 Chronic obstructive pulmonary disease with (acute) exacerbation: Secondary | ICD-10-CM | POA: Diagnosis not present

## 2024-05-17 DIAGNOSIS — J189 Pneumonia, unspecified organism: Secondary | ICD-10-CM | POA: Diagnosis not present

## 2024-05-17 LAB — POCT I-STAT 7, (LYTES, BLD GAS, ICA,H+H)
Acid-Base Excess: 4 mmol/L — ABNORMAL HIGH (ref 0.0–2.0)
Bicarbonate: 31.7 mmol/L — ABNORMAL HIGH (ref 20.0–28.0)
Calcium, Ion: 1.32 mmol/L (ref 1.15–1.40)
HCT: 39 % (ref 39.0–52.0)
Hemoglobin: 13.3 g/dL (ref 13.0–17.0)
O2 Saturation: 97 %
Patient temperature: 98.7
Potassium: 4.7 mmol/L (ref 3.5–5.1)
Sodium: 153 mmol/L — ABNORMAL HIGH (ref 135–145)
TCO2: 33 mmol/L — ABNORMAL HIGH (ref 22–32)
pCO2 arterial: 60 mmHg — ABNORMAL HIGH (ref 32–48)
pH, Arterial: 7.331 — ABNORMAL LOW (ref 7.35–7.45)
pO2, Arterial: 106 mmHg (ref 83–108)

## 2024-05-17 LAB — CULTURE, RESPIRATORY W GRAM STAIN

## 2024-05-17 LAB — CBC
HCT: 46.8 % (ref 39.0–52.0)
Hemoglobin: 13.7 g/dL (ref 13.0–17.0)
MCH: 26.9 pg (ref 26.0–34.0)
MCHC: 29.3 g/dL — ABNORMAL LOW (ref 30.0–36.0)
MCV: 91.8 fL (ref 80.0–100.0)
Platelets: 175 K/uL (ref 150–400)
RBC: 5.1 MIL/uL (ref 4.22–5.81)
RDW: 16.1 % — ABNORMAL HIGH (ref 11.5–15.5)
WBC: 13.7 K/uL — ABNORMAL HIGH (ref 4.0–10.5)
nRBC: 0 % (ref 0.0–0.2)

## 2024-05-17 LAB — CULTURE, BLOOD (ROUTINE X 2): Culture: NO GROWTH

## 2024-05-17 LAB — BASIC METABOLIC PANEL WITH GFR
Anion gap: 8 (ref 5–15)
BUN: 76 mg/dL — ABNORMAL HIGH (ref 8–23)
CO2: 28 mmol/L (ref 22–32)
Calcium: 9.1 mg/dL (ref 8.9–10.3)
Chloride: 118 mmol/L — ABNORMAL HIGH (ref 98–111)
Creatinine, Ser: 1.92 mg/dL — ABNORMAL HIGH (ref 0.61–1.24)
GFR, Estimated: 35 mL/min — ABNORMAL LOW (ref >60)
Glucose, Bld: 198 mg/dL — ABNORMAL HIGH (ref 70–99)
Potassium: 5 mmol/L (ref 3.5–5.1)
Sodium: 154 mmol/L — ABNORMAL HIGH (ref 135–145)

## 2024-05-17 LAB — GLUCOSE, CAPILLARY
Glucose-Capillary: 180 mg/dL — ABNORMAL HIGH (ref 70–99)
Glucose-Capillary: 192 mg/dL — ABNORMAL HIGH (ref 70–99)
Glucose-Capillary: 193 mg/dL — ABNORMAL HIGH (ref 70–99)
Glucose-Capillary: 231 mg/dL — ABNORMAL HIGH (ref 70–99)
Glucose-Capillary: 231 mg/dL — ABNORMAL HIGH (ref 70–99)
Glucose-Capillary: 238 mg/dL — ABNORMAL HIGH (ref 70–99)

## 2024-05-17 LAB — PHOSPHORUS: Phosphorus: 2.7 mg/dL (ref 2.5–4.6)

## 2024-05-17 MED ORDER — ARFORMOTEROL TARTRATE 15 MCG/2ML IN NEBU
15.0000 ug | INHALATION_SOLUTION | Freq: Two times a day (BID) | RESPIRATORY_TRACT | Status: DC
  Administered 2024-05-17 – 2024-06-08 (×45): 15 ug via RESPIRATORY_TRACT
  Filled 2024-05-17 (×46): qty 2

## 2024-05-17 MED ORDER — FREE WATER
200.0000 mL | Status: DC
  Administered 2024-05-17 – 2024-05-22 (×31): 200 mL

## 2024-05-17 NOTE — Progress Notes (Signed)
 eLink Physician-Brief Progress Note Patient Name: Clarence Maldonado DOB: 02/03/1946 MRN: [id number]   Date of Service  05/17/2024  HPI/Events of Note  Patient attempted to grab ETT  Currently encephalopathic  eICU Interventions  Bilateral wrist restraints ordered     Intervention Category Minor Interventions: Agitation / anxiety - evaluation and management  Clarence Maldonado Staff 05/17/2024, 8:33 PM

## 2024-05-17 NOTE — Progress Notes (Addendum)
 NAME:  Clarence Maldonado, MRN:  [id number], DOB:  January 02, 1946, LOS: 5 ADMISSION DATE:  05/12/2024, CONSULTATION DATE:  05/12/2024 REFERRING MD:  Cottie - EDP, CHIEF COMPLAINT:  Acute respiratory failure  History of Present Illness:  Patient is a 78 yo M w/ pertinent PMH COPD, CKD 3b, DMT2, HTN, HLD, BPH, R AKA, anxiety/depression presents to [facility] on 8/8 acute respiratory failure.  Patient resides at rehab facility. Having SOB on 8/8 and prescribed rocephin /azithro. Worsening SOB. EMS called found in severe resp distress unresponsive. LMA placed and being bagged en route to [facility] ED. Wheezing b/l. Patient intubated on arrival. BP became soft post intubation requiring Levo. Vbg 7.22, 64, 41, 26. Labs pending. Given nebs, IV steroids, mag. PCCM consulted for ICU admission.  Prior Echo in 2021 with EF of 30 to 35%. History of COPD. On triple therapy. No recent hospitalization for COPD.  Pertinent  Medical History   Past Medical History:  Diagnosis Date   Abnormal CT of the chest 2009   nodule 9 x 6 mm RUL   Benign localized hyperplasia of prostate with urinary obstruction and other lower urinary tract symptoms (LUTS)(600.21)    Cardiomegaly    Cerebrovascular accident (stroke) (HCC) 2009   lacunar infarct in the left thalamus   Cholelithiasis    Chronic airway obstruction, not elsewhere classified    Chronic kidney disease, stage III (moderate) (HCC)    Closed femur fracture (HCC) 1960   Contact with or exposure to viral disease    suspected    Illiterate    Obesity, unspecified    Other and unspecified hyperlipidemia    Special screening examination for respiratory tuberculosis    Tobacco use disorder    Tuberculosis    Type II or unspecified type diabetes mellitus with renal manifestations, uncontrolled(250.42)    Unspecified essential hypertension    Significant Hospital Events: Including procedures, antibiotic start and stop dates in addition to other pertinent events   8/8 - Admit w/  copd intubated 8/13 - Weaning on vent, PSV 10/5, mild tachypnea. Remains on Precedex .  Interim History / Subjective:  No significant events overnight Weaning this AM on PSV 10/5, mildly tachypneic with shallow breathing but minute ventilation ok Still very sleepy/not responsive on Precedex  0.6 Need to wean sedation to facilitate vent weaning Repeat CXR  Objective:   Blood pressure (!) 104/59, pulse 92, temperature 99.9 F (37.7 C), temperature source Axillary, resp. rate (!) 26, height 5' 5 (1.651 m), weight 59 kg, SpO2 98%.    Vent Mode: PSV;CPAP FiO2 (%):  [40 %] 40 % Set Rate:  [15 bmp-20 bmp] 20 bmp Vt Set:  [490 mL] 490 mL PEEP:  [5 cmH20] 5 cmH20 Pressure Support:  [10 cmH20] 10 cmH20 Plateau Pressure:  [16 cmH20-17 cmH20] 17 cmH20   Intake/Output Summary (Last 24 hours) at 05/17/2024 0754 Last data filed at 05/17/2024 0700 Gross per 24 hour  Intake 1603.71 ml  Output 1200 ml  Net 403.71 ml   Filed Weights   05/15/24 0500 05/16/24 0500 05/17/24 0453  Weight: 61.9 kg 61.9 kg 59 kg   Physical Examination: General: Acute-on-chronically ill-appearing older man in NAD. HEENT: [place]/AT, anicteric sclera, PERRL 3mm, moist mucous membranes. Neuro: Sedated. Does not respond to verbal, tactile or noxious stimuli. Minimal withdrawal to pain in BUE. Not following commands. No spontaneous movement noted on my exam. +Cough and +Gag  CV: RRR, no m/g/r. PULM: Breathing mildly tachypneic to 20s and mildly labored on vent (PSV 10/5, FiO2  40%). Lung fields diminished throughout. GI: Soft, nontender, nondistended. Normoactive bowel sounds. Extremities: No LLE edema noted. R AKA, stump well-healed. Skin: Warm/dry, no rashes.  Resolved Problem List:  Likely sedation induced hypotension  Assessment and Plan:   Acute respiratory failure w/ hypercapnia COPD exacerbation Viral pneumonia PsA on Resp Cx RVP metapneumovirus positive; PCT elevated 0.8 possible bacterial component - Continue  full vent support (4-8cc/kg IBW), weaning as able on PSV 10/5 8/13 - Wean FiO2 for O2 sat > 90% - Daily WUA/SBT; mental status and ongoing infection remain barriers to extubation - Continue bronchodilators (Brovana /Yupelri /Pulmicort ) - Continue prednisone  taper - VAP bundle - Pulmonary hygiene, chest PT - PAD protocol for sedation: Precedex  and Fentanyl  for goal RASS 0 to -1 - F/u repeat CXR - Continue cefepime  for PsA coverage  Acute encephalopathy, likely metabolic and also sedating medications Reassuring Head CT; likely hypercapnia/sepsis related. Mild hyperammonemia - Limit sedating medications as able - Correct electrolyte derangements - Avoid interruptions to normal sleep/wake cycle as able  Sepsis, likely 2/2 to pneumonia and possible uti w/ large leukocytes BCx 8/8 with 1/4 w/ staph hominis; RVP positive for metapneumovirus, Resp Cx 8/11 +PsA. - Goal MAP > 65 - Trend WBC, fever curve - F/u Cx data - Continue broad-spectrum antibiotics (cefepime )  AKI on CKD 3b, likely in the setting of septic ATN, improving Anion gap metabolic acidosis, slightly elevated lactate Hypernatremia - Trend BMP - Replete electrolytes as indicated - FWF increased to Q4H, monitor Na trend - Monitor I&Os - Avoid nephrotoxic agents as able - Ensure adequate renal perfusion  DMT2 Amaryl  and Farxiga  are both on hold. - SSI - CBGs Q4H - Goal CBG 140-180  HTN: - Hold home antihypertensives for now  HLD - Continue statin  BPH - Resume Flomax  as clinically indicated  Anxiety/depression  - Resume home Wellbutrin  as indicated  Best Practice (right click and Reselect all SmartList Selections daily)   Diet/type: tubefeeds DVT prophylaxis prophylactic heparin   Pressure ulcer(s): N/A GI prophylaxis: H2B Lines: N/A Foley:  Yes, and it is still needed Code Status:  full code Last date of multidisciplinary goals of care discussion [8/11 friend Norleen updated over phone]  Critical  care time:    The patient is critically ill with multiple organ system failure and requires high complexity decision making for assessment and support, frequent evaluation and titration of therapies, advanced monitoring, review of radiographic studies and interpretation of complex data.   Critical Care Time devoted to patient care services, exclusive of separately billable procedures, described in this note is 36 minutes.  Corean CHRISTELLA Oleg Oleson, PA-C [facility] 05/17/24 7:55 AM  Please see [email or web address] for pager details.  From 7A-7P if no response, please call [phone] After hours, please call ELink [phone]

## 2024-05-17 NOTE — Progress Notes (Signed)
 Gretchin, RN Juaquin) and Sunil, MD notified of patient having consecutive random movements with arms and shoulders. Patient does still withdraw from pain. Both RN and MD cameraed into the room. Sunil, MD not concerned that movements are seizure activity. MD added on labs and blood gas.

## 2024-05-17 NOTE — Plan of Care (Signed)
  Problem: Activity: Goal: Ability to tolerate increased activity will improve Outcome: Not Progressing   Problem: Respiratory: Goal: Ability to maintain a clear airway and adequate ventilation will improve Outcome: Not Progressing   Problem: Role Relationship: Goal: Method of communication will improve Outcome: Not Progressing   Problem: Education: Goal: Knowledge of General Education information will improve Description: Including pain rating scale, medication(s)/side effects and non-pharmacologic comfort measures Outcome: Not Progressing   Problem: Health Behavior/Discharge Planning: Goal: Ability to manage health-related needs will improve Outcome: Not Progressing   Problem: Clinical Measurements: Goal: Ability to maintain clinical measurements within normal limits will improve Outcome: Not Progressing Goal: Will remain free from infection Outcome: Not Progressing Goal: Diagnostic test results will improve Outcome: Not Progressing Goal: Respiratory complications will improve Outcome: Not Progressing Goal: Cardiovascular complication will be avoided Outcome: Not Progressing   Problem: Activity: Goal: Risk for activity intolerance will decrease Outcome: Not Progressing   Problem: Nutrition: Goal: Adequate nutrition will be maintained Outcome: Not Progressing   Problem: Coping: Goal: Level of anxiety will decrease Outcome: Not Progressing   Problem: Elimination: Goal: Will not experience complications related to bowel motility Outcome: Not Progressing Goal: Will not experience complications related to urinary retention Outcome: Not Progressing   Problem: Pain Managment: Goal: General experience of comfort will improve and/or be controlled Outcome: Not Progressing   Problem: Safety: Goal: Ability to remain free from injury will improve Outcome: Not Progressing   Problem: Skin Integrity: Goal: Risk for impaired skin integrity will decrease Outcome: Not  Progressing

## 2024-05-17 NOTE — Progress Notes (Signed)
 eLink Physician-Brief Progress Note Patient Name: Clarence Maldonado DOB: May 25, 1946 MRN: [id number]   Date of Service  05/17/2024  HPI/Events of Note  RN asked me to look at some head / shoulder movements. Seen on camera. No generalized seizures. No real muscle twitching either. Head nodding at times. Responds to pain even during these events. No vent alarms. No change in vitals  eICU Interventions  ABG ordered Get AM labs including ionized cal  Dw RN      Intervention Category Major Interventions: Respiratory failure - evaluation and management  Keileigh Vahey G Rikki Trosper 05/17/2024, 3:08 AM

## 2024-05-18 ENCOUNTER — Inpatient Hospital Stay (HOSPITAL_COMMUNITY)

## 2024-05-18 DIAGNOSIS — G9340 Encephalopathy, unspecified: Secondary | ICD-10-CM | POA: Diagnosis not present

## 2024-05-18 DIAGNOSIS — J9601 Acute respiratory failure with hypoxia: Secondary | ICD-10-CM | POA: Diagnosis not present

## 2024-05-18 DIAGNOSIS — J189 Pneumonia, unspecified organism: Secondary | ICD-10-CM | POA: Diagnosis not present

## 2024-05-18 DIAGNOSIS — N179 Acute kidney failure, unspecified: Secondary | ICD-10-CM | POA: Diagnosis not present

## 2024-05-18 LAB — CBC
HCT: 43.9 % (ref 39.0–52.0)
Hemoglobin: 13 g/dL (ref 13.0–17.0)
MCH: 27.2 pg (ref 26.0–34.0)
MCHC: 29.6 g/dL — ABNORMAL LOW (ref 30.0–36.0)
MCV: 91.8 fL (ref 80.0–100.0)
Platelets: 148 K/uL — ABNORMAL LOW (ref 150–400)
RBC: 4.78 MIL/uL (ref 4.22–5.81)
RDW: 16.3 % — ABNORMAL HIGH (ref 11.5–15.5)
WBC: 19.4 K/uL — ABNORMAL HIGH (ref 4.0–10.5)
nRBC: 0 % (ref 0.0–0.2)

## 2024-05-18 LAB — BASIC METABOLIC PANEL WITH GFR
Anion gap: 8 (ref 5–15)
BUN: 94 mg/dL — ABNORMAL HIGH (ref 8–23)
CO2: 26 mmol/L (ref 22–32)
Calcium: 8.7 mg/dL — ABNORMAL LOW (ref 8.9–10.3)
Chloride: 119 mmol/L — ABNORMAL HIGH (ref 98–111)
Creatinine, Ser: 2.45 mg/dL — ABNORMAL HIGH (ref 0.61–1.24)
GFR, Estimated: 26 mL/min — ABNORMAL LOW (ref >60)
Glucose, Bld: 195 mg/dL — ABNORMAL HIGH (ref 70–99)
Potassium: 5.9 mmol/L — ABNORMAL HIGH (ref 3.5–5.1)
Sodium: 153 mmol/L — ABNORMAL HIGH (ref 135–145)

## 2024-05-18 LAB — GLUCOSE, CAPILLARY
Glucose-Capillary: 166 mg/dL — ABNORMAL HIGH (ref 70–99)
Glucose-Capillary: 195 mg/dL — ABNORMAL HIGH (ref 70–99)
Glucose-Capillary: 241 mg/dL — ABNORMAL HIGH (ref 70–99)
Glucose-Capillary: 251 mg/dL — ABNORMAL HIGH (ref 70–99)
Glucose-Capillary: 285 mg/dL — ABNORMAL HIGH (ref 70–99)
Glucose-Capillary: 298 mg/dL — ABNORMAL HIGH (ref 70–99)

## 2024-05-18 LAB — PHOSPHORUS: Phosphorus: 2.7 mg/dL (ref 2.5–4.6)

## 2024-05-18 LAB — POTASSIUM: Potassium: 5.3 mmol/L — ABNORMAL HIGH (ref 3.5–5.1)

## 2024-05-18 MED ORDER — SODIUM ZIRCONIUM CYCLOSILICATE 10 G PO PACK
10.0000 g | PACK | Freq: Once | ORAL | Status: AC
  Administered 2024-05-18: 10 g
  Filled 2024-05-18: qty 1

## 2024-05-18 MED ORDER — PIPERACILLIN-TAZOBACTAM 3.375 G IVPB
3.3750 g | Freq: Three times a day (TID) | INTRAVENOUS | Status: DC
  Administered 2024-05-18 – 2024-05-19 (×2): 3.375 g via INTRAVENOUS
  Filled 2024-05-18 (×2): qty 50

## 2024-05-18 MED ORDER — SODIUM CHLORIDE 0.45 % IV BOLUS
1000.0000 mL | Freq: Once | INTRAVENOUS | Status: AC
  Administered 2024-05-18: 1000 mL via INTRAVENOUS

## 2024-05-18 MED ORDER — DEXTROSE 5 % IV BOLUS
250.0000 mL | Freq: Once | INTRAVENOUS | Status: AC
  Administered 2024-05-18: 250 mL via INTRAVENOUS

## 2024-05-18 MED ORDER — NUTRISOURCE FIBER PO PACK
1.0000 | PACK | Freq: Two times a day (BID) | ORAL | Status: DC
  Administered 2024-05-18 – 2024-05-28 (×21): 1
  Filled 2024-05-18 (×22): qty 1

## 2024-05-18 MED ORDER — INSULIN ASPART 100 UNIT/ML IJ SOLN
2.0000 [IU] | INTRAMUSCULAR | Status: DC
  Administered 2024-05-18 – 2024-05-19 (×4): 2 [IU] via SUBCUTANEOUS

## 2024-05-18 MED ORDER — LINEZOLID 600 MG/300ML IV SOLN
600.0000 mg | Freq: Two times a day (BID) | INTRAVENOUS | Status: AC
  Administered 2024-05-18 – 2024-05-25 (×14): 600 mg via INTRAVENOUS
  Filled 2024-05-18 (×14): qty 300

## 2024-05-18 MED ORDER — IOHEXOL 350 MG/ML SOLN
75.0000 mL | Freq: Once | INTRAVENOUS | Status: AC | PRN
  Administered 2024-05-18: 75 mL via INTRAVENOUS

## 2024-05-18 MED ORDER — PREDNISONE 20 MG PO TABS
20.0000 mg | ORAL_TABLET | Freq: Every day | ORAL | Status: DC
  Administered 2024-05-19: 20 mg
  Filled 2024-05-18: qty 1

## 2024-05-18 NOTE — Progress Notes (Signed)
  Progress Note   Date: 05/16/2024  Patient Name: Clarence Maldonado        MRN#: [id number]  Clarification of diagnosis:  CKD Stage 3b

## 2024-05-18 NOTE — Progress Notes (Signed)
 Nutrition Follow-up  DOCUMENTATION CODES:  Severe malnutrition in context of chronic illness  INTERVENTION:  TF via Cortrak: Osmolite 1.2 at 55 ml/h (1320 ml per day) Prosource TF20 60 ml once daily Provides 1664 kcal, 93 gm protein, 1082 ml free water  daily  free water  flush q4h ( total) Total free water  provided (TF +FWF)-   Add nutrisource fiber BID to aid in bulking of loose stools  NUTRITION DIAGNOSIS:  Severe Malnutrition related to chronic illness (COPD) as evidenced by severe muscle depletion, severe fat depletion. - remains applicable  GOAL:  Patient will meet greater than or equal to 90% of their needs - goal met via TF  MONITOR:  Vent status, Labs, Weight trends, TF tolerance  REASON FOR ASSESSMENT:  Ventilator, Consult Assessment of nutrition requirement/status  ASSESSMENT:  Pt admitted from rehab with worsening shortness of breath, found to have severe respiratory distress and unresponsive. PMH significant for COPD, CKD 3b, T2DM, HTN, HLD, right AKA, anxiety/depression.  8/8: admitted with COPD exacerbation, intubated 8/11: Cortrak placement (tip in the duodenum)  Patient remains intubated on ventilator support MV: 11.4 L/min Temp (24hrs), Avg:99.5 F (37.5 C), Min:97.7 F (36.5 C), Max:101.3 F (38.5 C)  Pt passed SBT. Mental status precludes extubation at this time.   Continues on tube feeding via Cortrak at goal rate.  No n/v however having loose stools though sandy texture per RN.  Flexiseal in place. Pt had been on bowel regimen which was discontinued yesterday which is likely precipitating cause of loose stools.  Potassium was elevated today, therefore was given lokelma . Favor nutrisource fiber over banatrol for bulking agent.   Discussed elevated CBG's with Pharmacy and likely need for tube feeding insulin  coverage. Pt also weaning off steroids which will also likely aid in better blood sugar control.   Admit weight: 61.9  kg Current weight: 61.7 kg  Medications: SSI 0-15 units q4h, prednisone , thiamine , IV abx  Labs:  Sodium 153 Potassium 5.9 BUN 94 Cr 2.45 GFR 26 CBG's 166-238 x24 hours  Diet Order:   Diet Order             Diet NPO time specified  Diet effective now                   EDUCATION NEEDS:   No education needs have been identified at this time  Skin:  Skin Assessment: Reviewed RN Assessment  Last BM:  x24 hours via FMS  Height:   Ht Readings from Last 1 Encounters:  05/12/24 5' 5 (1.651 m)    Weight:   Wt Readings from Last 1 Encounters:  05/18/24 61.7 kg   BMI:  Body mass index is 22.64 kg/m.  Estimated Nutritional Needs:   Kcal:  1600-1800  Protein:  85-100g  Fluid:  >/=1.6L  Allie Tramell Piechota, RDN, LDN Clinical Nutrition See AMiON for contact information.

## 2024-05-18 NOTE — Progress Notes (Signed)
 Patient was noted to have scrotal swelling and erythema, it is extremely tender to touch  Will get CT pelvis with and with out contrast to r/o fournier gangrene   Patient serum cr is elevated but CT with contrast needed, benefits outweigh the risk of renal failure.   Valinda Novas, MD

## 2024-05-18 NOTE — Plan of Care (Signed)
  Problem: Activity: Goal: Ability to tolerate increased activity will improve Outcome: Progressing   Problem: Respiratory: Goal: Ability to maintain a clear airway and adequate ventilation will improve Outcome: Progressing   Problem: Role Relationship: Goal: Method of communication will improve Outcome: Progressing   

## 2024-05-18 NOTE — Progress Notes (Signed)
 Pt transported from 3M13 to CT and back on the ventilator without any complications.

## 2024-05-18 NOTE — Progress Notes (Signed)
   05/18/24 0734  Daily Weaning Assessment  Daily Assessment of Readiness to Wean Wean protocol criteria met (SBT performed)  SBT Method CPAP 5 cm H20 and PS 5 cm H20 (10/5)  Weaning Start Time 0734  Patient response Passed (Tolerated well)

## 2024-05-18 NOTE — Plan of Care (Signed)
  Problem: Activity: Goal: Ability to tolerate increased activity will improve Outcome: Progressing   Problem: Respiratory: Goal: Ability to maintain a clear airway and adequate ventilation will improve Outcome: Progressing   Problem: Clinical Measurements: Goal: Respiratory complications will improve Outcome: Progressing   Problem: Nutrition: Goal: Adequate nutrition will be maintained Outcome: Progressing

## 2024-05-18 NOTE — Progress Notes (Signed)
 Called to update listed friend- no answer

## 2024-05-18 NOTE — Progress Notes (Signed)
 NAME:  Clarence Maldonado, MRN:  [id number], DOB:  [birth year], LOS: 6 ADMISSION DATE:  05/12/2024, CONSULTATION DATE:  05/12/2024 REFERRING MD:  Cottie - EDP, CHIEF COMPLAINT:  Acute respiratory failure  History of Present Illness:  78 year old man who presented to [facility] 8/8 for acute respiratory failure. PMHx significant for HTN, HLD, COPD, CKD 3b, DMT2, BPH, R AKA, anxiety/depression.  Patient resides at rehab facility. Having SOB on 8/8 and prescribed rocephin /azithro. Worsening SOB. EMS called found in severe resp distress unresponsive. LMA placed and being bagged en route to [facility] ED. Wheezing b/l. Patient intubated on arrival. BP became soft post intubation requiring Levo. Vbg 7.22, 64, 41, 26. Labs pending. Given nebs, IV steroids, mag. PCCM consulted for ICU admission.  Prior Echo in 2021 with EF of 30 to 35%. History of COPD. On triple therapy. No recent hospitalization for COPD.  Pertinent  Medical History   Past Medical History:  Diagnosis Date   Abnormal CT of the chest 2009   nodule 9 x 6 mm RUL   Benign localized hyperplasia of prostate with urinary obstruction and other lower urinary tract symptoms (LUTS)(600.21)    Cardiomegaly    Cerebrovascular accident (stroke) (HCC) 2009   lacunar infarct in the left thalamus   Cholelithiasis    Chronic airway obstruction, not elsewhere classified    Chronic kidney disease, stage III (moderate) (HCC)    Closed femur fracture (HCC) 1960   Contact with or exposure to viral disease    suspected    Illiterate    Obesity, unspecified    Other and unspecified hyperlipidemia    Special screening examination for respiratory tuberculosis    Tobacco use disorder    Tuberculosis    Type II or unspecified type diabetes mellitus with renal manifestations, uncontrolled(250.42)    Unspecified essential hypertension    Significant Hospital Events: Including procedures, antibiotic start and stop dates in addition to other pertinent events   8/8 -  Admit w/ copd intubated 8/13 - Weaning on vent, PSV 10/5, mild tachypnea. Remains on Precedex .  Interim History / Subjective:  No significant events Mildly agitated overnight, reaching for ETT, restraints ordered Weaning this AM, PSV 10/5 Precedex  down to 0.2, weaning as able to facilitate vent weaning  Objective:   Blood pressure (!) 99/54, pulse 75, temperature 99.3 F (37.4 C), temperature source Axillary, resp. rate (!) 24, height 5' 5 (1.651 m), weight 61.7 kg, SpO2 97%.    Vent Mode: PSV;CPAP FiO2 (%):  [40 %] 40 % Set Rate:  [20 bmp] 20 bmp Vt Set:  [490 mL] 490 mL PEEP:  [5 cmH20] 5 cmH20 Pressure Support:  [10 cmH20] 10 cmH20 Plateau Pressure:  [17 cmH20-19 cmH20] 17 cmH20   Intake/Output Summary (Last 24 hours) at 05/18/2024 0808 Last data filed at 05/18/2024 0756 Gross per 24 hour  Intake 1874.64 ml  Output 930 ml  Net 944.64 ml   Filed Weights   05/16/24 0500 05/17/24 0453 05/18/24 0417  Weight: 61.9 kg 59 kg 61.7 kg   Physical Examination: General: Acute-on-chronically ill-appearing older man in NAD. HEENT: [place]/AT, anicteric sclera, PERRL 3.41mm, moist mucous membranes. Neuro: Mildly sedated. Wakes up more readily than prior but still slow to follow commands. Responds to noxious stimuli. Following commands intermittently. +Cough and +Gag  CV: RRR, no m/g/r. PULM: Breathing mildly tachypneic to low 20s, unlabored on vent wean (PSV 10/5). Lung fields diminished with poor air movement throughout. GI: Soft, nontender, nondistended. Normoactive bowel sounds. Extremities: No LLE edema  noted. R AKA noted. Skin: Warm/dry, no rashes.  Resolved Problem List:  Likely sedation induced hypotension  Assessment and Plan:   Acute respiratory failure w/ hypercapnia COPD exacerbation Viral pneumonia PsA on Resp Cx RVP metapneumovirus positive; PCT elevated 0.8 possible bacterial component with Resp Cx +PsA. - Continue full vent support (4-8cc/kg IBW), wean as able on PSV  10/5 8/14 - Wean FiO2 for O2 sat > 90% - Daily WUA/SBT; barriers to extubation remain mental status and ongoing infection - Continue bronchodilators (Brovana , Yupelri , Pulmicort ) - Continue prednisone  taper - VAP bundle - Pulmonary hygiene/chest PT - PAD protocol for sedation: Precedex  and Fentanyl  for goal RASS 0 to -1 - Follow CXR - Cefepime  for PsA coverage, narrow once extubated  Acute encephalopathy, likely metabolic and also sedating medications Reassuring Head CT; likely hypercapnia/sepsis related. Mild hyperammonemia - Limit sedating medications as able - Correct electrolyte derangements - Avoid interruptions to normal sleep/wake cycle as able  Sepsis, likely 2/2 to pneumonia and possible uti w/ large leukocytes BCx 8/8 with 1/4 w/ staph hominis; RVP positive for metapneumovirus, Resp Cx 8/11 +PsA. - Goal MAP > 65 - Fluid resuscitation as tolerated - Trend WBC, fever curve - F/u Cx data - Continue broad-spectrum antibiotics (cefepime )  AKI on CKD 3b, likely in the setting of septic ATN, improving Anion gap metabolic acidosis, slightly elevated lactate Hypernatremia - Trend BMP - Replete electrolytes as indicated - FWF Q4H + D5 250mL bolus x 1, monitor Na trend - Lokelma  ordered - Monitor I&Os - Avoid nephrotoxic agents as able - Ensure adequate renal perfusion  DMT2 Amaryl  and Farxiga  are both on hold. - SSI - CBGs Q4H - Goal CBG 140-180  HTN: - Hold home antihypertensives in the setting of soft BP - Cardiac monitoring  HLD - Continue statin  BPH - Resume Flomax  as able  Anxiety/depression  - Resume home Wellbutrin  as able  Best Practice (right click and Reselect all SmartList Selections daily)   Diet/type: tubefeeds DVT prophylaxis prophylactic heparin   Pressure ulcer(s): N/A GI prophylaxis: H2B Lines: N/A Foley:  Yes, and it is still needed Code Status:  full code Last date of multidisciplinary goals of care discussion [8/11 friend  Norleen updated over phone, will reattempt this afternoon 8/14]  Critical care time:    The patient is critically ill with multiple organ system failure and requires high complexity decision making for assessment and support, frequent evaluation and titration of therapies, advanced monitoring, review of radiographic studies and interpretation of complex data.   Critical Care Time devoted to patient care services, exclusive of separately billable procedures, described in this note is 35 minutes.  Corean CHRISTELLA Verdon Ferrante, PA-C [facility] 05/18/24 8:08 AM  Please see [email or web address] for pager details.  From 7A-7P if no response, please call [phone] After hours, please call ELink [phone]

## 2024-05-19 ENCOUNTER — Encounter (HOSPITAL_COMMUNITY)
Admission: EM | Disposition: A | Discharge status: Skilled Nursing Facility | Payer: Self-pay | Source: Skilled Nursing Facility | Attending: Internal Medicine

## 2024-05-19 ENCOUNTER — Other Ambulatory Visit: Payer: Self-pay

## 2024-05-19 ENCOUNTER — Inpatient Hospital Stay (HOSPITAL_COMMUNITY): Admitting: Anesthesiology

## 2024-05-19 ENCOUNTER — Encounter (HOSPITAL_COMMUNITY)

## 2024-05-19 ENCOUNTER — Encounter (HOSPITAL_COMMUNITY): Payer: Self-pay

## 2024-05-19 DIAGNOSIS — E8721 Acute metabolic acidosis: Secondary | ICD-10-CM

## 2024-05-19 DIAGNOSIS — N492 Inflammatory disorders of scrotum: Secondary | ICD-10-CM

## 2024-05-19 DIAGNOSIS — I110 Hypertensive heart disease with heart failure: Secondary | ICD-10-CM

## 2024-05-19 DIAGNOSIS — N1831 Chronic kidney disease, stage 3a: Secondary | ICD-10-CM

## 2024-05-19 DIAGNOSIS — J9602 Acute respiratory failure with hypercapnia: Secondary | ICD-10-CM | POA: Diagnosis not present

## 2024-05-19 DIAGNOSIS — I509 Heart failure, unspecified: Secondary | ICD-10-CM

## 2024-05-19 DIAGNOSIS — L02215 Cutaneous abscess of perineum: Secondary | ICD-10-CM

## 2024-05-19 DIAGNOSIS — J189 Pneumonia, unspecified organism: Secondary | ICD-10-CM | POA: Diagnosis not present

## 2024-05-19 DIAGNOSIS — A419 Sepsis, unspecified organism: Secondary | ICD-10-CM | POA: Diagnosis not present

## 2024-05-19 DIAGNOSIS — I2510 Atherosclerotic heart disease of native coronary artery without angina pectoris: Secondary | ICD-10-CM | POA: Diagnosis not present

## 2024-05-19 DIAGNOSIS — J441 Chronic obstructive pulmonary disease with (acute) exacerbation: Secondary | ICD-10-CM | POA: Diagnosis not present

## 2024-05-19 DIAGNOSIS — E43 Unspecified severe protein-calorie malnutrition: Secondary | ICD-10-CM | POA: Insufficient documentation

## 2024-05-19 HISTORY — PX: CYSTOSCOPY: SHX5120

## 2024-05-19 HISTORY — PX: INCISION AND DRAINAGE ABSCESS: SHX5864

## 2024-05-19 LAB — CBC WITH DIFFERENTIAL/PLATELET
Abs Immature Granulocytes: 0.2 K/uL — ABNORMAL HIGH (ref 0.00–0.07)
Basophils Absolute: 0 K/uL (ref 0.0–0.1)
Basophils Relative: 0 %
Eosinophils Absolute: 0.1 K/uL (ref 0.0–0.5)
Eosinophils Relative: 1 %
HCT: 41.9 % (ref 39.0–52.0)
Hemoglobin: 12 g/dL — ABNORMAL LOW (ref 13.0–17.0)
Immature Granulocytes: 1 %
Lymphocytes Relative: 3 %
Lymphs Abs: 0.7 K/uL (ref 0.7–4.0)
MCH: 27 pg (ref 26.0–34.0)
MCHC: 28.6 g/dL — ABNORMAL LOW (ref 30.0–36.0)
MCV: 94.2 fL (ref 80.0–100.0)
Monocytes Absolute: 0.8 K/uL (ref 0.1–1.0)
Monocytes Relative: 4 %
Neutro Abs: 17.9 K/uL — ABNORMAL HIGH (ref 1.7–7.7)
Neutrophils Relative %: 91 %
Platelets: 143 K/uL — ABNORMAL LOW (ref 150–400)
RBC: 4.45 MIL/uL (ref 4.22–5.81)
RDW: 16.2 % — ABNORMAL HIGH (ref 11.5–15.5)
WBC: 19.6 K/uL — ABNORMAL HIGH (ref 4.0–10.5)
nRBC: 0 % (ref 0.0–0.2)

## 2024-05-19 LAB — CALCIUM, IONIZED: Calcium, Ionized, Serum: 5.1 mg/dL (ref 4.5–5.6)

## 2024-05-19 LAB — COMPREHENSIVE METABOLIC PANEL WITH GFR
ALT: 9 U/L (ref 0–44)
AST: 11 U/L — ABNORMAL LOW (ref 15–41)
Albumin: 1.8 g/dL — ABNORMAL LOW (ref 3.5–5.0)
Alkaline Phosphatase: 53 U/L (ref 38–126)
Anion gap: 9 (ref 5–15)
BUN: 96 mg/dL — ABNORMAL HIGH (ref 8–23)
CO2: 25 mmol/L (ref 22–32)
Calcium: 8.2 mg/dL — ABNORMAL LOW (ref 8.9–10.3)
Chloride: 113 mmol/L — ABNORMAL HIGH (ref 98–111)
Creatinine, Ser: 2.62 mg/dL — ABNORMAL HIGH (ref 0.61–1.24)
GFR, Estimated: 24 mL/min — ABNORMAL LOW (ref >60)
Glucose, Bld: 296 mg/dL — ABNORMAL HIGH (ref 70–99)
Potassium: 5 mmol/L (ref 3.5–5.1)
Sodium: 147 mmol/L — ABNORMAL HIGH (ref 135–145)
Total Bilirubin: 0.5 mg/dL (ref 0.0–1.2)
Total Protein: 5.7 g/dL — ABNORMAL LOW (ref 6.5–8.1)

## 2024-05-19 LAB — GLUCOSE, CAPILLARY
Glucose-Capillary: 254 mg/dL — ABNORMAL HIGH (ref 70–99)
Glucose-Capillary: 198 mg/dL — ABNORMAL HIGH (ref 70–99)
Glucose-Capillary: 242 mg/dL — ABNORMAL HIGH (ref 70–99)
Glucose-Capillary: 245 mg/dL — ABNORMAL HIGH (ref 70–99)
Glucose-Capillary: 232 mg/dL — ABNORMAL HIGH (ref 70–99)

## 2024-05-19 LAB — MAGNESIUM: Magnesium: 2.4 mg/dL (ref 1.7–2.4)

## 2024-05-19 LAB — PHOSPHORUS: Phosphorus: 3.5 mg/dL (ref 2.5–4.6)

## 2024-05-19 SURGERY — INCISION AND DRAINAGE, ABSCESS
Anesthesia: General | Site: Scrotum

## 2024-05-19 MED ORDER — PHENYLEPHRINE HCL-NACL 20-0.9 MG/250ML-% IV SOLN
INTRAVENOUS | Status: DC | PRN
  Administered 2024-05-19: 20 ug/min via INTRAVENOUS

## 2024-05-19 MED ORDER — 0.9 % SODIUM CHLORIDE (POUR BTL) OPTIME
TOPICAL | Status: DC | PRN
  Administered 2024-05-19: 1000 mL

## 2024-05-19 MED ORDER — PHENYLEPHRINE 80 MCG/ML (10ML) SYRINGE FOR IV PUSH (FOR BLOOD PRESSURE SUPPORT)
PREFILLED_SYRINGE | INTRAVENOUS | Status: DC | PRN
Start: 2024-05-19 — End: 2024-05-19
  Administered 2024-05-19: 80 ug via INTRAVENOUS

## 2024-05-19 MED ORDER — NOREPINEPHRINE 4 MG/250ML-% IV SOLN
0.0000 ug/min | INTRAVENOUS | Status: DC
  Administered 2024-05-19 – 2024-05-20 (×2): 2 ug/min via INTRAVENOUS
  Filled 2024-05-19: qty 250

## 2024-05-19 MED ORDER — FENTANYL CITRATE PF 50 MCG/ML IJ SOSY
PREFILLED_SYRINGE | INTRAMUSCULAR | Status: AC
  Filled 2024-05-19: qty 2

## 2024-05-19 MED ORDER — INSULIN ASPART 100 UNIT/ML IJ SOLN
4.0000 [IU] | INTRAMUSCULAR | Status: DC
  Administered 2024-05-19 – 2024-05-22 (×17): 4 [IU] via SUBCUTANEOUS

## 2024-05-19 MED ORDER — FENTANYL CITRATE PF 50 MCG/ML IJ SOSY
25.0000 ug | PREFILLED_SYRINGE | INTRAMUSCULAR | Status: DC | PRN
  Administered 2024-05-19: 50 ug via INTRAVENOUS
  Administered 2024-05-19: 100 ug via INTRAVENOUS
  Administered 2024-05-20 – 2024-05-21 (×5): 50 ug via INTRAVENOUS
  Administered 2024-05-21: 100 ug via INTRAVENOUS
  Administered 2024-05-21 – 2024-05-22 (×5): 50 ug via INTRAVENOUS
  Administered 2024-05-22 (×2): 100 ug via INTRAVENOUS
  Administered 2024-05-22: 50 ug via INTRAVENOUS
  Administered 2024-05-23: 100 ug via INTRAVENOUS
  Administered 2024-05-23: 50 ug via INTRAVENOUS
  Administered 2024-05-23 (×2): 100 ug via INTRAVENOUS
  Administered 2024-05-24 (×2): 50 ug via INTRAVENOUS
  Administered 2024-05-24 – 2024-05-25 (×2): 100 ug via INTRAVENOUS
  Administered 2024-05-25: 50 ug via INTRAVENOUS
  Filled 2024-05-19 (×2): qty 1
  Filled 2024-05-19 (×3): qty 2
  Filled 2024-05-19 (×4): qty 1
  Filled 2024-05-19: qty 2
  Filled 2024-05-19 (×4): qty 1
  Filled 2024-05-19: qty 2
  Filled 2024-05-19 (×2): qty 1
  Filled 2024-05-19: qty 2
  Filled 2024-05-19: qty 1
  Filled 2024-05-19: qty 2
  Filled 2024-05-19 (×2): qty 1
  Filled 2024-05-19 (×2): qty 2
  Filled 2024-05-19 (×2): qty 1

## 2024-05-19 MED ORDER — PIPERACILLIN-TAZOBACTAM IN DEX 2-0.25 GM/50ML IV SOLN
2.2500 g | Freq: Three times a day (TID) | INTRAVENOUS | Status: DC
  Administered 2024-05-19 – 2024-05-20 (×3): 2.25 g via INTRAVENOUS
  Filled 2024-05-19 (×4): qty 50

## 2024-05-19 MED ORDER — LIDOCAINE 2% (20 MG/ML) 5 ML SYRINGE
INTRAMUSCULAR | Status: AC
Start: 2024-05-19 — End: 2024-05-19
  Filled 2024-05-19: qty 5

## 2024-05-19 MED ORDER — HEPARIN SODIUM (PORCINE) 5000 UNIT/ML IJ SOLN
5000.0000 [IU] | Freq: Three times a day (TID) | INTRAMUSCULAR | Status: DC
  Administered 2024-05-20 – 2024-06-09 (×61): 5000 [IU] via SUBCUTANEOUS
  Filled 2024-05-19 (×61): qty 1

## 2024-05-19 MED ORDER — LIDOCAINE-EPINEPHRINE (PF) 2 %-1:200000 IJ SOLN
INTRAMUSCULAR | Status: AC
  Filled 2024-05-19: qty 20

## 2024-05-19 MED ORDER — PHENYLEPHRINE 80 MCG/ML (10ML) SYRINGE FOR IV PUSH (FOR BLOOD PRESSURE SUPPORT)
PREFILLED_SYRINGE | INTRAVENOUS | Status: AC
Start: 2024-05-19 — End: 2024-05-19
  Filled 2024-05-19: qty 10

## 2024-05-19 MED ORDER — PROPOFOL 10 MG/ML IV BOLUS
INTRAVENOUS | Status: AC
  Filled 2024-05-19: qty 20

## 2024-05-19 MED ORDER — STERILE WATER FOR IRRIGATION IR SOLN
Status: DC | PRN
Start: 2024-05-19 — End: 2024-05-19
  Administered 2024-05-19: 1000 mL

## 2024-05-19 MED ORDER — SODIUM CHLORIDE 0.9 % IV SOLN: 250.0000 mL | INTRAVENOUS | Status: AC

## 2024-05-19 MED ORDER — NOREPINEPHRINE 4 MG/250ML-% IV SOLN
INTRAVENOUS | Status: AC
Start: 2024-05-19 — End: 2024-05-19
  Administered 2024-05-19: 2 ug/min via INTRAVENOUS
  Filled 2024-05-19: qty 250

## 2024-05-19 MED ORDER — LIDOCAINE HCL (PF) 1 % IJ SOLN
INTRAMUSCULAR | Status: AC
  Filled 2024-05-19: qty 5

## 2024-05-19 MED ORDER — LACTATED RINGERS IV SOLN: INTRAVENOUS | Status: DC | PRN

## 2024-05-19 MED ORDER — FENTANYL CITRATE (PF) 250 MCG/5ML IJ SOLN
INTRAMUSCULAR | Status: AC
Start: 2024-05-19 — End: 2024-05-19
  Filled 2024-05-19: qty 5

## 2024-05-19 MED ORDER — FENTANYL CITRATE PF 50 MCG/ML IJ SOSY
25.0000 ug | PREFILLED_SYRINGE | INTRAMUSCULAR | Status: DC | PRN
  Administered 2024-05-21: 25 ug via INTRAVENOUS
  Filled 2024-05-19: qty 1

## 2024-05-19 MED ORDER — SODIUM CHLORIDE 0.9 % IR SOLN
Status: DC | PRN
  Administered 2024-05-19: 3000 mL

## 2024-05-19 MED ORDER — FENTANYL CITRATE (PF) 250 MCG/5ML IJ SOLN
INTRAMUSCULAR | Status: DC | PRN
  Administered 2024-05-19: 50 ug via INTRAVENOUS
  Administered 2024-05-19: 100 ug via INTRAVENOUS

## 2024-05-19 MED ORDER — ROCURONIUM BROMIDE 100 MG/10ML IV SOLN
INTRAVENOUS | Status: DC | PRN
  Administered 2024-05-19: 40 mg via INTRAVENOUS

## 2024-05-19 SURGICAL SUPPLY — 30 items
BLADE SURG 15 STRL LF DISP TIS (BLADE) ×4 IMPLANT
BNDG GAUZE DERMACEA FLUFF 4 (GAUZE/BANDAGES/DRESSINGS) IMPLANT
CATH FOLEY 2W COUNCIL 5CC 18FR (CATHETERS) IMPLANT
CNTNR URN SCR LID CUP LEK RST (MISCELLANEOUS) ×2 IMPLANT
DRAPE SHEET LG 3/4 BI-LAMINATE (DRAPES) IMPLANT
ELECTRODE REM PT RTRN 9FT ADLT (ELECTROSURGICAL) ×2 IMPLANT
GAUZE PAD ABD 8X10 STRL (GAUZE/BANDAGES/DRESSINGS) ×4 IMPLANT
GAUZE SPONGE 4X4 12PLY STRL (GAUZE/BANDAGES/DRESSINGS) ×2 IMPLANT
GLOVE BIOGEL PI IND STRL 6.5 (GLOVE) IMPLANT
GLOVE BIOGEL PI IND STRL 7.5 (GLOVE) IMPLANT
GLOVE SURG ORTHO 8.0 STRL STRW (GLOVE) ×2 IMPLANT
GOWN STRL REUS W/ TWL LRG LVL3 (GOWN DISPOSABLE) IMPLANT
GOWN STRL REUS W/ TWL XL LVL3 (GOWN DISPOSABLE) IMPLANT
KIT BASIN OR (CUSTOM PROCEDURE TRAY) ×2 IMPLANT
KIT TURNOVER KIT B (KITS) ×2 IMPLANT
LEGGING LITHOTOMY PAIR STRL (DRAPES) IMPLANT
MANIFOLD NEPTUNE II (INSTRUMENTS) IMPLANT
NS IRRIG 1000ML POUR BTL (IV SOLUTION) ×2 IMPLANT
PACK GENERAL/GYN (CUSTOM PROCEDURE TRAY) ×2 IMPLANT
PAD ABD 8X10 STRL (GAUZE/BANDAGES/DRESSINGS) IMPLANT
PAD ARMBOARD POSITIONER FOAM (MISCELLANEOUS) ×4 IMPLANT
SOL PREP POV-IOD 4OZ 10% (MISCELLANEOUS) ×2 IMPLANT
SPONGE T-LAP 18X18 ~~LOC~~+RFID (SPONGE) IMPLANT
SUT CHROMIC 3 0 SH 27 (SUTURE) ×2 IMPLANT
SUT PROLENE 4 0 PS 2 18 (SUTURE) ×2 IMPLANT
SUT VIC AB 4-0 PS2 18 (SUTURE) ×2 IMPLANT
SWAB COLLECTION DEVICE MRSA (MISCELLANEOUS) ×2 IMPLANT
SWAB CULTURE ESWAB REG 1ML (MISCELLANEOUS) IMPLANT
TOWEL GREEN STERILE (TOWEL DISPOSABLE) ×2 IMPLANT
WATER STERILE IRR 1000ML POUR (IV SOLUTION) ×2 IMPLANT

## 2024-05-19 NOTE — Plan of Care (Signed)
  Problem: Respiratory: Goal: Ability to maintain a clear airway and adequate ventilation will improve Outcome: Progressing   Problem: Clinical Measurements: Goal: Ability to maintain clinical measurements within normal limits will improve Outcome: Progressing   Problem: Clinical Measurements: Goal: Will remain free from infection Outcome: Progressing   Problem: Clinical Measurements: Goal: Diagnostic test results will improve Outcome: Progressing   Problem: Clinical Measurements: Goal: Respiratory complications will improve Outcome: Progressing   Problem: Clinical Measurements: Goal: Cardiovascular complication will be avoided Outcome: Progressing   Problem: Nutrition: Goal: Adequate nutrition will be maintained Outcome: Progressing   Problem: Elimination: Goal: Will not experience complications related to bowel motility Outcome: Progressing   Problem: Pain Managment: Goal: General experience of comfort will improve and/or be controlled Outcome: Progressing   Problem: Safety: Goal: Ability to remain free from injury will improve Outcome: Progressing   Problem: Skin Integrity: Goal: Risk for impaired skin integrity will decrease Outcome: Progressing   Problem: Safety: Goal: Non-violent Restraint(s) Outcome: Progressing   Plan of care, assessment, monitoring, treatment, and intervention (s) ongoing see MAR see flowheet

## 2024-05-19 NOTE — Progress Notes (Signed)
 NAME:  Clarence Maldonado, MRN:  [id number], DOB:  [birth year], LOS: 7 ADMISSION DATE:  05/12/2024, CONSULTATION DATE:  05/12/2024 REFERRING MD:  Cottie - EDP, CHIEF COMPLAINT:  Acute respiratory failure  History of Present Illness:  78 year old man who presented to [facility] 8/8 for acute respiratory failure. PMHx significant for HTN, HLD, COPD, CKD 3b, DMT2, BPH, R AKA, anxiety/depression.  Patient resides at rehab facility. Having SOB on 8/8 and prescribed rocephin /azithro. Worsening SOB. EMS called found in severe resp distress unresponsive. LMA placed and being bagged en route to [facility] ED. Wheezing b/l. Patient intubated on arrival. BP became soft post intubation requiring Levo. VBG 7.22/64/41/26. Labs pending. Given nebs, IV steroids, Mg. PCCM consulted for ICU admission.  Prior Echo in 2021 with EF of 30 to 35%. History of COPD. On triple therapy. No recent hospitalization for COPD.  Pertinent  Medical History   Past Medical History:  Diagnosis Date   Abnormal CT of the chest 2009   nodule 9 x 6 mm RUL   Benign localized hyperplasia of prostate with urinary obstruction and other lower urinary tract symptoms (LUTS)(600.21)    Cardiomegaly    Cerebrovascular accident (stroke) (HCC) 2009   lacunar infarct in the left thalamus   Cholelithiasis    Chronic airway obstruction, not elsewhere classified    Chronic kidney disease, stage III (moderate) (HCC)    Closed femur fracture (HCC) 1960   Contact with or exposure to viral disease    suspected    Illiterate    Obesity, unspecified    Other and unspecified hyperlipidemia    Special screening examination for respiratory tuberculosis    Tobacco use disorder    Tuberculosis    Type II or unspecified type diabetes mellitus with renal manifestations, uncontrolled(250.42)    Unspecified essential hypertension    Significant Hospital Events: Including procedures, antibiotic start and stop dates in addition to other pertinent events   8/8 - Admit  w/ COPD, intubated. 8/13 - Weaning on vent, PSV 10/5, mild tachypnea. Remains on Precedex .  Interim History / Subjective:  Scrotal swelling/erythema noted yesterday evening CT Pelvis demonstrated 9.4 x 3.5 x 6.8cm fluid collection in inferior R perineum extending to the level of the testicles c/f abscess with diffuse scrotal wall edema and occlusion of the visualized R SFA Urology consulted, recommended emergent I&D + Foley placement  Underwent cystoscopy and I&D 8/15AM with purulent drainage, wound packed Returned to ICU postoperatively, noted to be hypotensive Low-dose Levophed  added with stabilization of BP Remains intubated, sedated  Objective:   Blood pressure (!) 103/50, pulse 74, temperature (!) 97.5 F (36.4 C), temperature source Axillary, resp. rate (!) 25, height 5' 5 (1.651 m), weight 61.7 kg, SpO2 97%.    Vent Mode: PRVC FiO2 (%):  [40 %] 40 % Set Rate:  [20 bmp] 20 bmp Vt Set:  [490 mL] 490 mL PEEP:  [5 cmH20] 5 cmH20 Plateau Pressure:  [18 cmH20-21 cmH20] 19 cmH20   Intake/Output Summary (Last 24 hours) at 05/19/2024 0836 Last data filed at 05/19/2024 0735 Gross per 24 hour  Intake 4227.26 ml  Output 1440 ml  Net 2787.26 ml   Filed Weights   05/17/24 0453 05/18/24 0417 05/19/24 0452  Weight: 59 kg 61.7 kg 61.7 kg   Physical Examination: General: Acute-on-chronically ill-appearing older man in NAD. HEENT: [place]/AT, anicteric sclera, PERRL 3mm sluggish, moist mucous membranes. Neuro: Drowsy, opens eyes to voice. Responds to verbal stimuli. Not following commands. Generalized weakness. +Cough and +Gag  CV: RRR, no m/g/r. PULM: Breathing even and unlabored on vent (PEEP 5, FiO2 40%). Lung fields clear in upper fields, diminished at bases. GI: Soft, nontender, nondistended. Normoactive bowel sounds. GU: Scrotal swelling noted with gauze dressing/betadine-soaked packing in place. Extremities: No LLE edema noted. R AKA. Skin: Warm/dry, no rashes.  Resolved  Problem List:  Likely sedation induced hypotension  Assessment and Plan:   Acute respiratory failure w/ hypercapnia COPD exacerbation Viral pneumonia PsA on Resp Cx RVP metapneumovirus positive; PCT elevated 0.8 possible bacterial component with Resp Cx +PsA. - Continue full vent support (4-8cc/kg IBW) - Wean FiO2 for O2 sat > 90% - Daily WUA/SBT; barriers to extubation remain mental status/ongoing infection - Bronchodilators (Brovana /Yupelri /Pulmicort ) - Discontinue prednisone  - VAP bundle - Pulmonary hygiene, chest PT - PAD protocol for sedation: Precedex  and Fentanyl  for goal RASS 0 to -1 - Broadened antibiotic coverage (Zosyn /Linezolid )  Acute encephalopathy, likely metabolic and also sedating medications Reassuring Head CT; likely hypercapnia/sepsis related. Mild hyperammonemia - Limit sedating medications as able - Correct electrolyte derangements - Avoids interruptions to normal sleep/wake cycle as able  Perineal abscess with scrotal erythema/edema CT Pelvis 8/14 with 9.4 x 3.5 x 6.8cm fluid collection in inferior R perineum extending to the level of the testicles c/f abscess with diffuse scrotal wall edema and occlusion of the visualized R SFA. - POD#0 from I&D, cystoscopy - Urology consulted, following - Postoperative management per Urology - PAD protocol for pain management - Local wound care, packing in place - Broad-spectrum antibiotics (Zosyn /Linezolid ) - F/u OR Cx, narrow abx as able  Sepsis, likely 2/2 to pneumonia, perineal abscess and possible UTI w/ large leukocytes BCx 8/8 with 1/4 w/ staph hominis; RVP positive for metapneumovirus, Resp Cx 8/11 +PsA. - Goal MAP > 65 - Fluid resuscitation as tolerated - Levophed  titrated to goal MAP - Trend WBC, fever curve - F/u Cx data - Continue broad-spectrum antibiotics (Zosyn /Linezolid )  AKI on CKD 3b, likely in the setting of septic ATN, improving Anion gap metabolic acidosis, slightly elevated  lactate Hypernatremia - Trend BMP - Replete electrolytes as indicated - FWF Q4H, monitor Na trend; may need to reduce - S/p Lokelma  x 1 - Monitor I&Os - Avoid nephrotoxic agents as able - Ensure adequate renal perfusion  DMT2 Amaryl  and Farxiga  are both on hold. - SSI - CBGs Q4H - TF coverage 4U Q4H - Goal CBG 140-180  HTN: - Hold home antihypertensives in the setting of soft BP - Cardiac monitoring  HLD - Continue statin  BPH - Resume Flomax  as able  Anxiety/depression  - Resume home Wellbutrin  as able  Best Practice (right click and Reselect all SmartList Selections daily)   Diet/type: tubefeeds DVT prophylaxis prophylactic heparin   Pressure ulcer(s): N/A GI prophylaxis: H2B Lines: N/A Foley:  Yes, and it is still needed Code Status:  full code Last date of multidisciplinary goals of care discussion [8/14 friend Norleen updated over phone]  Critical care time:    The patient is critically ill with multiple organ system failure and requires high complexity decision making for assessment and support, frequent evaluation and titration of therapies, advanced monitoring, review of radiographic studies and interpretation of complex data.   Critical Care Time devoted to patient care services, exclusive of separately billable procedures, described in this note is 36 minutes.  Corean CHRISTELLA Eann Cleland, PA-C [facility] 05/19/24 8:36 AM  Please see [email or web address] for pager details.  From 7A-7P if no response, please call [phone] After hours, please  call ELink [phone]

## 2024-05-19 NOTE — Op Note (Addendum)
 Operative Note  Preoperative diagnosis:  1.  Perineal abscess  Postoperative diagnosis: 1.  Perineal abscess  Procedure(s): 1.  Cystoscopy 2. Incision and drainage of perineal abscess  Surgeon: Donnice Siad, MD  Assistants:  Jacqulyn Bound, PGY-4  Anesthesia:  General  Complications:  None  EBL:  Minimal  Specimens: 1.  ID Type Source Tests Collected by Time Destination  A : perineal abscess GU Scrotum AEROBIC/ANAEROBIC CULTURE W GRAM STAIN (SURGICAL/DEEP WOUND) Siad Donnice SAUNDERS, MD 05/19/2024 0715     Drains/Catheters: 1.  18 French council catheter  Intraoperative findings:   Cystoscopy with intact urethra with mild erythema at the 6 o'clock position of the bulbar urethra extending for a couple centimeters.  Prostate nonobstructing, no lesions within the bladder.  Sensor wire placed and 18 [place] council catheter advanced with return of 300 mL urine Mild scrotal erythema and significant edema.  No necrosis, fluctuance, crepitus or induration on scrotal or perineal exam.  He does have scrotal edema.  Per the imaging, the fluid collection was deep and thus incision was made over the anterior aspect of the scrotum approximately 3 cm.  This drained approximately 100 cc purulent drainage. Cavity extends posteriorly towards the rectum however does not involve the rectum, extends anteriorly towards his right inguinal ring, posteriorly towards the pubic bowel which is palpable and Foley catheter is palpable within the intact bulbar urethra. Cavity packed with Betadine moistened Kerlix.  Indication:  Clarence Maldonado is a 78 y.o. male with significant past medical history including hypertension, COPD, chronic kidney disease stage III, type 2 diabetes, s/p right AKA who required intubation due to respiratory distress from Pseudomonas pneumonia on 05/12/2024.  Continue with cyclical fever.  He was found to have erythematous and edematous scrotum that was tender-CT pelvis was ordered.  CT pelvis  05/18/2024 revealed a near 9 x 7 cm fluid collection in the inferior right perineum extending to the level of the testicles also seen extending towards the rectum.  There is no gas or suspicion of Fournier's gangrene.  Given fluid collection, he is being brought to the operating today for incision and drainage of perineal abscess.  Over the past week, the ICU team has been able to reach any family.  He has a friend [name] who consented to this procedure.  Description of procedure: The indications, alternatives, benefits, and risks were discussed with the patient's friend and the patient is intubated and no family has been found over the past week. Informed was obtained.  The patient was brought onto the operating room table, positioned in dorsolithotomy supine position and secured with a safety strap.  All pressure points were carefully padded and pneumatic compression devices were placed on the lower extremities.  Of note, he has right AKA and this stump was secured.  After the administration of intravenous antibiotics and general anesthesia, the patient scrotum was examined.  He only had mild erythema of the inferior scrotum and significant scrotal edema.  Of note, no induration, fluctuance, crepitus or necrosis was visualized.  We began by passing a 17 French flexible cystoscope into the urethra where we encountered a couple centimeters of erythema on the 6 o'clock position of the bulbar urethra however no stricture and no defect within the urethra.  Scope was advanced past the nonobstructing prostate into the bladder or encounter no worrisome lesions.  0.038 sensor wire was advanced into the bladder and scope was removed.  Over this wire, we passed an 59 [place] council catheter with return  of 300 mL cloudy urine.  Next, attention was turned towards his scrotum.  Given preoperative imaging with abscess apparently couple centimeters deep to the perineum and scrotum, we made an approximately 5 cm incision  on the inferior aspect of the scrotum where we incised through the scrotal wall edema with healthy bleeding tissue.  Next, we encountered a fluid collection.  There was return of purulent fluid.  This was sent for aerobic and anaerobic cultures.  We drained approximately 100 cc of thin purulent fluid.  Next, we palpated the cavity and there is a significant cavernous cavity extending towards the rectum as well as anteriorly towards the right external iliac wing and posterior to the scrotum along the base of the bulbar urethra.  The Foley catheter was palpated within the bulbar urethra and great care was taken not to damage the urethra.  I could also palpate pubic symphysis anteriorly.  Next, the cavity was irrigated with 3 L sterile water .  We visualized the tissues and these were healthy with no evidence of necrosis.  There were actually a few bleeding areas on the incision at the healthy scrotal wall edema and this was cauterized with excellent hemostasis.  We inspected his wound and there was no injury to the bulbar urethra.  Next, we packed the cavity with 1 roll of moistened Kerlix and Betadine again, taking great care not to damage the bulbar urethra.  Next an ABD was placed over the scrotal incision.  Next, disposable underwear was applied over this.  At the end of procedure all counts were correct.  The patient tolerated the procedure well and was taken back to his bed in the ICU.  Findings were discussed with his friend over the phone.  Plan: Patient will require daily dressing changes.  These will likely be able to occur in the ICU as he is still intubated and sedated.  Will involve wound care.  During dressing changes, ensure great care taken not to damage bulbar urethra.  I do not favor wound VAC given close proximity to bulbar urethra.  Continue IV antibiotics.  Matt R. Lesean Woolverton MD [facility]  Pager: [phone]

## 2024-05-19 NOTE — Transfer of Care (Signed)
 Immediate Anesthesia Transfer of Care Note  Patient: Clarence Maldonado  Procedure(s) Performed: INCISION AND DRAINAGE, PERINEAL ABSCESS (Scrotum) CYSTOSCOPY, FLEXIBLE (Penis)  Patient Location: ICU  Anesthesia Type:General  Level of Consciousness: sedated  Airway & Oxygen  Therapy: Patient remains intubated per anesthesia plan and Patient placed on Ventilator (see vital sign flow sheet for setting)  Post-op Assessment: Report given to RN and Post -op Vital signs reviewed and stable  Post vital signs: Reviewed and stable  Last Vitals:  Vitals Value Taken Time  BP 115/58   Temp 36.4 C 05/19/24 08:05  Pulse 68 05/19/24 08:10  Resp 16 05/19/24 08:11  SpO2 99 % 05/19/24 08:10  Vitals shown include unfiled device data.  Last Pain:  Vitals:   05/19/24 0805  TempSrc: Axillary         Complications: No notable events documented.

## 2024-05-19 NOTE — Progress Notes (Signed)
   05/19/24 0800  Restraint Order  Length of Order Daily    Patient in OR

## 2024-05-19 NOTE — Consult Note (Signed)
 WOC Nurse Consult Note: Reason for Consult: perineal surgical wound; S/P I&D. Cavity is about 10 x 7 cm.   Recommend initiating daily dressing changes starting on 05/20/2024.   Wound type: surgical  Pressure Injury POA: NA Measurement: operative note; 10x7cm; bedside nursing to measure with first dressing change; orders added  Wound bed: see operative note and nursing flow sheets after first dressing change  Drainage (amount, consistency, odor) see nursing flowsheets Periwound:intact  Dressing procedure/placement/frequency: Pack surgical wound with saline moist gauze kerlix (wound is 10 x 7 cm), surgeon used 1 roll of kerlix. Top with ABD pads, secure with mesh underwear. Change daily beginning 05/20/24   Re consult if needed, will not follow at this time. Thanks  Agripina Guyette [employer], RN,CWOCN, CNS, [employer] [phone]

## 2024-05-19 NOTE — Plan of Care (Signed)
  Problem: Activity: Goal: Ability to tolerate increased activity will improve Outcome: Progressing   Problem: Clinical Measurements: Goal: Respiratory complications will improve Outcome: Progressing Goal: Cardiovascular complication will be avoided Outcome: Progressing

## 2024-05-19 NOTE — Anesthesia Postprocedure Evaluation (Signed)
 Anesthesia Post Note  Patient: Clarence Maldonado  Procedure(s) Performed: INCISION AND DRAINAGE, PERINEAL ABSCESS (Scrotum) CYSTOSCOPY, FLEXIBLE (Penis)     Patient location during evaluation: ICU Anesthesia Type: General Level of consciousness: sedated Pain management: pain level controlled Vital Signs Assessment: post-procedure vital signs reviewed and stable Respiratory status: patient remains intubated per anesthesia plan Cardiovascular status: stable Postop Assessment: no apparent nausea or vomiting Anesthetic complications: no   No notable events documented.  Last Vitals:  Vitals:   05/19/24 0638 05/19/24 0805  BP:    Pulse: 74   Resp: (!) 25   Temp:  (!) 36.4 C  SpO2: 98% 97%    Last Pain:  Vitals:   05/19/24 0805  TempSrc: Axillary                 Sotiria Keast P Sintia Mckissic

## 2024-05-19 NOTE — TOC Initial Note (Signed)
 Transition of Care [facility]) - Initial/Assessment Note    Patient Details  Name: Clarence Maldonado MRN: [id number] Date of Birth: 22-Oct-1945  [facility]) CM/SW Contact:    Lendia Dais, LCSWA Phone Number: 05/19/2024, 2:36 PM  Clinical Narrative: Pt is currently intubated and CSW was unable to assess.   CSW contacted Jon from [facility] and stated that the pt is pending medical stability. CSW asked if the patient will be returning to [facility] and [place] agreed.   Per Jon SNF will need to be notified if CPAP is needed post discharge.   CSW will continue to follow.                  Expected Discharge Plan: Long Term Nursing Home (From [facility]) Barriers to Discharge: Continued Medical Work up   Patient Goals and CMS Choice     Choice offered to / list presented to : NA      Expected Discharge Plan and Services In-house Referral: Clinical Social Work     Living arrangements for the past 2 months: Skilled Nursing Facility                                      Prior Living Arrangements/Services Living arrangements for the past 2 months: Skilled Nursing Facility Lives with:: Facility Resident Patient language and need for interpreter reviewed:: Yes        Need for Family Participation in Patient Care: Yes (Comment) Care giver support system in place?: Yes (comment)   Criminal Activity/Legal Involvement Pertinent to Current Situation/Hospitalization: No - Comment as needed  Activities of Daily Living      Permission Sought/Granted Permission sought to share information with : Facility Industrial/product designer granted to share information with : No (Pt intubated)  Share Information with NAME: Jon [facility] representation)           Emotional Assessment Appearance:: Appears stated age Attitude/Demeanor/Rapport: Unable to Assess Affect (typically observed): Unable to Assess Orientation: :  (Unable to assess pt intubated) Alcohol /  Substance Use: Tobacco Use Psych Involvement: No (comment)  Admission diagnosis:  COPD exacerbation (HCC) [J44.1] Acute respiratory failure with hypercapnia (HCC) [J96.02] Hypotension, unspecified hypotension type [I95.9] Patient Active Problem List   Diagnosis Date Noted   Protein-calorie malnutrition, severe 05/19/2024   Acute respiratory failure with hypercapnia (HCC) 05/12/2024   Malnutrition of moderate degree 10/27/2023   Cellulitis and abscess of right lower extremity 10/23/2023   Cellulitis 10/22/2023   Aortic atherosclerosis (HCC) 02/17/2023   Dyspnea on exertion 09/16/2022   History of TB (tuberculosis) 10/17/2020   Nonischemic cardiomyopathy (HCC) 03/13/2020   Contact with or exposure to viral disease 02/01/2020   Special screening examination for respiratory tuberculosis 02/01/2020   Chest pain 01/26/2020   Acute lower UTI 07/23/2019   Fatigue    Lung infiltrate    Pulmonary tuberculosis with cavitation 06/06/2019   Moderate protein-calorie malnutrition (HCC) 06/06/2019   Hypokalemia 05/15/2019   Anemia 05/15/2019   Hypermetropia of both eyes 03/21/2018   Weight loss 03/21/2018   Hyperlipidemia associated with type 2 diabetes mellitus (HCC) 03/21/2018   Insomnia 02/03/2017   Cognitive change 01/29/2016   Illiterate 01/03/2014   Controlled type 2 diabetes mellitus with stage 3 chronic kidney disease, without long-term current use of insulin  (HCC) 01/05/2013   Stranguria 01/05/2013   Essential hypertension 01/04/2013   CKD (chronic kidney disease) stage 3,  GFR 30-59 ml/min (HCC) 01/04/2013   Hyperlipidemia 01/04/2013   Tobacco use disorder 01/04/2013   BPH without obstruction/lower urinary tract symptoms 01/04/2013   PCP:  Gil Greig BRAVO, NP Pharmacy:   [facility] [address], [place] - [address] AT [facility] [address] [place] [address] Phone: [phone] Fax: [phone]     Social Drivers of  Health (SDOH) Social History: SDOH Screenings   Food Insecurity: No Food Insecurity (10/20/2023)  Housing: Unknown (10/20/2023)  Transportation Needs: No Transportation Needs (10/20/2023)  Utilities: Not At Risk (10/20/2023)  Depression (PHQ2-9): Low Risk  (10/14/2023)  Financial Resource Strain: Low Risk  (03/23/2019)  Physical Activity: Inactive (03/23/2019)  Social Connections: Socially Isolated (03/23/2019)  Stress: No Stress Concern Present (03/23/2019)  Tobacco Use: Medium Risk (05/19/2024)   SDOH Interventions:     Readmission Risk Interventions     No data to display

## 2024-05-19 NOTE — Progress Notes (Signed)
 Pt transported from 3M13 to OR on the ventilator without any complications.

## 2024-05-19 NOTE — Anesthesia Preprocedure Evaluation (Addendum)
 Anesthesia Evaluation   Patient unresponsive    Reviewed: Allergy & Precautions, NPO status , Patient's Chart, lab work & pertinent test results, Unable to perform ROS - Chart review only  History of Anesthesia Complications Negative for: history of anesthetic complications  Airway Mallampati: Intubated       Dental   Pulmonary pneumonia, unresolved, COPD, former smoker    + decreased breath sounds  + intubated    Cardiovascular hypertension, Pt. on medications and Pt. on home beta blockers + CAD, + Past MI and +CHF   Rhythm:Regular Rate:Normal   '25 TTE - EF 70 to 75%. There is mild left ventricular hypertrophy. Grade I diastolic dysfunction (impaired relaxation). The right ventricular size is mildly enlarged. Aortic valve regurgitation is mild. There is mild dilatation of the ascending aorta, measuring 41 mm.     Neuro/Psych CVA    GI/Hepatic   Endo/Other  diabetes, Type 2, Oral Hypoglycemic Agents   Na 147   Renal/GU CRFRenal disease     Musculoskeletal   Abdominal   Peds  Hematology  Plt 143k    Anesthesia Other Findings   Reproductive/Obstetrics                              Anesthesia Physical Anesthesia Plan  ASA: 3 and emergent  Anesthesia Plan: General   Post-op Pain Management:    Induction: Inhalational  PONV Risk Score and Plan: 2 and Treatment may vary due to age or medical condition  Airway Management Planned: Oral ETT  Additional Equipment: None  Intra-op Plan:   Post-operative Plan: Post-operative intubation/ventilation  Informed Consent:      History available from chart only  Plan Discussed with: CRNA and Anesthesiologist  Anesthesia Plan Comments:          Anesthesia Quick Evaluation

## 2024-05-19 NOTE — Anesthesia Procedure Notes (Addendum)
 Date/Time: 05/19/2024 6:49 AM  Performed by: Viviana Almarie DASEN, CRNAPre-anesthesia Checklist: Patient identified, Emergency Drugs available, Suction available, Patient being monitored and Timeout performed Patient Re-evaluated:Patient Re-evaluated prior to induction Oxygen  Delivery Method: Circle system utilized Induction Type: Inhalational induction Placement Confirmation: breath sounds checked- equal and bilateral and positive ETCO2 Secured at: 21 cm Comments: Patient transport from 3M13 with ventilator and respiratory therapy. 8.0  ETT present. BBE . +ETCO2. Patient stable

## 2024-05-19 NOTE — Progress Notes (Signed)
 Patient transported from OR 9 to 3M13 via the ventilator without any complications.

## 2024-05-19 NOTE — Progress Notes (Addendum)
 eLink Physician-Brief Progress Note Patient Name: SHOICHI MIELKE DOB: 1946/09/20 MRN: [id number]   Date of Service  05/19/2024  HPI/Events of Note  CT Pelvis with 9.4 x 3.5 x 6.8 cm fluid collection in the inferior right perineum extending to the level of the testicles.  Urology consulted  Other findings: 1. 9.4 x 3.5 x 6.8 cm fluid collection in the inferior right perineum extending to the level of the testicles. This is worrisome for abscess. 2. Diffuse scrotal wall edema. 3. Mild posterior left bladder wall thickening. Correlate clinically for cystitis. 4. Occlusion of the visualized right superficial femoral artery. 5. Complex right renal cyst measuring up to 7 cm. Recommend further evaluation with renal ultrasound. 6. Small fat containing right inguinal hernia.   eICU Interventions  NPO for potential OR drainage in AM Hold subQ heparin  US  RLE  Will consider renal US  in the future when stable     Intervention Category Intermediate Interventions: Infection - evaluation and management  Soham Hollett Slater Staff 05/19/2024, 12:24 AM

## 2024-05-19 NOTE — Consult Note (Addendum)
 Urology Consult Note   Requesting Attending Physician:  Neda Jennet LABOR, MD Service Providing Consult: Urology  Consulting Attending: Dr. Kassie   Reason for Consult:  C/f perineal vs scrotal abscess  HPI: Clarence Maldonado is seen in consultation for reasons noted above at the request of Neda Jennet LABOR, MD for evaluation of perineal/scrotal abscess.  This is a 78 y.o. male with hx of HTN, HLD, COPD, CKD3, T2DM, BPH, R AKA who presented with SOB on 8/8 from his nursing home with concerns for pneumonia, he required intubation due to respiratory distress and has been intubated since. He had continued to fever and had not been responding appropriately to his pneumonia treatment and so CT Pelvis was obtained demonstrating 9 x 3 x 6cm inferior right perineum/scrotal fluid collection worrisome for abscess for which urology was consulted.   Patient's last fever was ~24 hours ago, has been maintaining pressures and has had a regular rate and rhythm, condom catheter in place with borderline low UoP, labs from this morning with worsening leukocytosis to 19 from 13, and a Cr bump from 1.92 to 2.45.   Past Medical History: Past Medical History:  Diagnosis Date   Abnormal CT of the chest 2009   nodule 9 x 6 mm RUL   Benign localized hyperplasia of prostate with urinary obstruction and other lower urinary tract symptoms (LUTS)(600.21)    Cardiomegaly    Cerebrovascular accident (stroke) (HCC) 2009   lacunar infarct in the left thalamus   Cholelithiasis    Chronic airway obstruction, not elsewhere classified    Chronic kidney disease, stage III (moderate) (HCC)    Closed femur fracture (HCC) 1960   Contact with or exposure to viral disease    suspected    Illiterate    Obesity, unspecified    Other and unspecified hyperlipidemia    Special screening examination for respiratory tuberculosis    Tobacco use disorder    Tuberculosis    Type II or unspecified type diabetes mellitus with  renal manifestations, uncontrolled(250.42)    Unspecified essential hypertension     Past Surgical History:  Past Surgical History:  Procedure Laterality Date   ABDOMINAL AORTOGRAM W/LOWER EXTREMITY N/A 10/25/2023   Procedure: ABDOMINAL AORTOGRAM W/LOWER EXTREMITY;  Surgeon: Pearline Norman RAMAN, MD;  Location: [facility] INVASIVE CV LAB;  Service: Cardiovascular;  Laterality: N/A;   AMPUTATION Right 10/28/2023   Procedure: AMPUTATION ABOVE KNEE;  Surgeon: Serene Gaile ORN, MD;  Location: [facility] OR;  Service: Vascular;  Laterality: Right;   FEMUR FRACTURE SURGERY Left 1960    Medication: Current Facility-Administered Medications  Medication Dose Route Frequency Provider Last Rate Last Admin   acetaminophen  (TYLENOL ) tablet 650 mg  650 mg Per Tube Q4H PRN Payne, [name] D, PA-C   650 mg at 05/18/24 0355   albuterol  (PROVENTIL ) (2.5 MG/3ML) 0.083% nebulizer solution 2.5 mg  2.5 mg Nebulization Q4H PRN Shelah Lamar RAMAN, MD   2.5 mg at 05/14/24 1956   arformoterol  (BROVANA ) nebulizer solution 15 mcg  15 mcg Nebulization BID Olalere, Adewale A, MD   15 mcg at 05/18/24 1916   atorvastatin  (LIPITOR) tablet 40 mg  40 mg Per Tube Daily Payne, [name] D, PA-C   40 mg at 05/18/24 1009   budesonide  (PULMICORT ) nebulizer solution 0.5 mg  0.5 mg Nebulization BID Payne, [name] D, PA-C   0.5 mg at 05/18/24 1916   Chlorhexidine  Gluconate Cloth 2 % PADS 6 each  6 each Topical Daily Payne, [name] D, PA-C  6 each at 05/18/24 1010   dexmedetomidine  (PRECEDEX ) 400 MCG/100ML (4 mcg/mL) infusion  0-1.2 mcg/kg/hr Intravenous Titrated Byrum, Paymon S, MD 9.08 mL/hr at 05/18/24 2300 0.6 mcg/kg/hr at 05/18/24 2300   docusate (COLACE) 50 MG/5ML liquid 100 mg  100 mg Per Tube BID PRN Payne, [name] D, PA-C   100 mg at 05/12/24 2102   famotidine  (PEPCID ) tablet 10 mg  10 mg Per Tube Daily Olalere, Adewale A, MD   10 mg at 05/18/24 1009   feeding supplement (OSMOLITE 1.2 CAL) liquid 1,000 mL  1,000 mL Per Tube Continuous Olalere, Adewale A, MD 55 mL/hr  at 05/18/24 2300 Infusion Verify at 05/18/24 2300   feeding supplement (PROSource TF20) liquid 60 mL  60 mL Per Tube Daily Olalere, Adewale A, MD   60 mL at 05/18/24 1011   fentaNYL  (SUBLIMAZE ) bolus via infusion 25-100 mcg  25-100 mcg Intravenous Q15 min PRN Grubb, Abigail, DO   50 mcg at 05/14/24 1630   fentaNYL  in NS (10mcg/ml) infusion-PREMIX  0-400 mcg/hr Intravenous Continuous Nada Chroman, DO   Stopped at 05/15/24 1135   fiber (NUTRISOURCE FIBER) 1 packet  1 packet Per Tube BID Olalere, Adewale A, MD   1 packet at 05/18/24 2147   free water  200 mL  200 mL Per Tube Q4H Ilah Krabbe M, PA-C   200 mL at 05/19/24 0000   heparin  injection 5,000 Units  5,000 Units Subcutaneous Q8H Paliwal, Ria, MD   5,000 Units at 05/18/24 2146   insulin  aspart (novoLOG ) injection 0-15 Units  0-15 Units Subcutaneous Q4H Payne, [name] D, PA-C   8 Units at 05/18/24 2358   insulin  aspart (novoLOG ) injection 2 Units  2 Units Subcutaneous Q4H Ilah Krabbe M, PA-C   2 Units at 05/18/24 2358   lidocaine  (PF) (XYLOCAINE ) 1 % injection            lidocaine -EPINEPHrine  (XYLOCAINE  W/EPI) 2 %-1:200000 (PF) injection            linezolid  (ZYVOX ) IVPB 600 mg  600 mg Intravenous Q12H Claudene Toribio BROCKS, MD   Stopped at 05/18/24 2250   metoprolol  tartrate (LOPRESSOR ) injection 5 mg  5 mg Intravenous Q6H PRN Byrum, Goodwin S, MD       metoprolol  tartrate (LOPRESSOR ) tablet 12.5 mg  12.5 mg Per Tube BID Olalere, Adewale A, MD   12.5 mg at 05/18/24 2146   Oral care mouth rinse  15 mL Mouth Rinse Q2H Paliwal, Aditya, MD   15 mL at 05/19/24 0000   Oral care mouth rinse  15 mL Mouth Rinse PRN Paliwal, Aditya, MD       piperacillin -tazobactam (ZOSYN ) IVPB 3.375 g  3.375 g Intravenous Q8H Hershal Vito MATSU, RPH 12.5 mL/hr at 05/18/24 2300 Infusion Verify at 05/18/24 2300   polyethylene glycol (MIRALAX  / GLYCOLAX ) packet 17 g  17 g Per Tube Daily PRN Payne, [name] D, PA-C       predniSONE  (DELTASONE ) tablet 20 mg  20 mg  Per Tube Q breakfast Olalere, Adewale A, MD       revefenacin  (YUPELRI ) nebulizer solution 175 mcg  175 mcg Nebulization Daily Payne, [name] D, PA-C   175 mcg at 05/18/24 0738   thiamine  (VITAMIN B1) tablet 100 mg  100 mg Per Tube Daily Olalere, Adewale A, MD   100 mg at 05/18/24 1009    Allergies: No Known Allergies  Social History: Social History   Tobacco Use   Smoking status: Former    Current packs/day:  0.50    Average packs/day: 0.5 packs/day for 60.0 years (30.0 ttl pk-yrs)    Types: Cigarettes   Smokeless tobacco: Never  Vaping Use   Vaping status: Never Used  Substance Use Topics   Alcohol use: Not Currently   Drug use: No    Family History Family History  Problem Relation Age of Onset   Heart disease Mother     Review of Systems 10 systems were reviewed and are negative except as noted specifically in the HPI.  Objective   Vital signs in last 24 hours: BP 137/70   Pulse 68   Temp 98.1 F (36.7 C) (Axillary)   Resp (!) 21   Ht 5' 5 (1.651 m)   Wt 61.7 kg   SpO2 99%   BMI 22.64 kg/m   Physical Exam General: NAD, intubated HEENT: [place]/AT Pulmonary: Mechanically ventilated Cardiovascular: HDS, adequate peripheral perfusion Abdomen: Soft, NTTP, nondistended. GU: Edematous scrotum tender to palpation, no discrete fluid collection palpated in scrotum, perineum, or along penile shaft  Most Recent Labs: Lab Results  Component Value Date   WBC 19.4 (H) 05/18/2024   HGB 13.0 05/18/2024   HCT 43.9 05/18/2024   PLT 148 (L) 05/18/2024    Lab Results  Component Value Date   NA 153 (H) 05/18/2024   K 5.3 (H) 05/18/2024   CL 119 (H) 05/18/2024   CO2 26 05/18/2024   BUN 94 (H) 05/18/2024   CREATININE 2.45 (H) 05/18/2024   CALCIUM  8.7 (L) 05/18/2024   MG 2.7 (H) 05/15/2024   MG 2.8 (H) 05/15/2024   PHOS 2.7 05/18/2024    Lab Results  Component Value Date   INR 1.1 05/12/2024   APTT 30 05/12/2024     Urine  Culture: @[id number] (laburin,org,[id number],[id number])@   IMAGING: CT PELVIS W CONTRAST Result Date: 05/18/2024 CLINICAL DATA:  Concern for Fournier's gangrene. EXAM: CT PELVIS WITH CONTRAST TECHNIQUE: Multidetector CT imaging of the pelvis was performed using the standard protocol following the bolus administration of intravenous contrast. RADIATION DOSE REDUCTION: This exam was performed according to the departmental dose-optimization program which includes automated exposure control, adjustment of the mA and/or kV according to patient size and/or use of iterative reconstruction technique. CONTRAST:  75mL OMNIPAQUE  IOHEXOL  350 MG/ML SOLN COMPARISON:  None Available. FINDINGS: Urinary Tract: Complex right renal cyst containing a thin septation is partially imaged measuring up to 7 cm. There is mild posterior left bladder wall thickening. No bladder calculi are seen. Bowel: Rectal tube in place. There is sigmoid colon diverticulosis. The appendix is normal. No dilated bowel loops are visualized. Vascular/Lymphatic: There is occlusion of the visualized right superficial femoral artery. Otherwise, pelvic arterial structures appear patent. There is severe atherosclerotic disease present. No enlarged lymph nodes are seen. Reproductive: There is a 9.4 x 3.5 by 6.8 cm fluid collection in the inferior right perineum extending to the level of the testicles. This abuts the base of the penis and is inferior to the pelvic floor. There is diffuse scrotal wall edema. Other: There is a small fat containing right inguinal hernia. There is edema and swelling in the right inguinal region. Musculoskeletal: Degenerative changes affect the spine. IMPRESSION: 1. 9.4 x 3.5 x 6.8 cm fluid collection in the inferior right perineum extending to the level of the testicles. This is worrisome for abscess. 2. Diffuse scrotal wall edema. 3. Mild posterior left bladder wall thickening. Correlate clinically for cystitis. 4. Occlusion of the visualized  right superficial femoral artery. 5. Complex right renal cyst measuring  up to 7 cm. Recommend further evaluation with renal ultrasound. 6. Small fat containing right inguinal hernia. Aortic Atherosclerosis (ICD10-I70.0). Electronically Signed   By: Greig Pique M.D.   On: 05/18/2024 23:28   DG Chest Port 1 View Result Date: 05/17/2024 CLINICAL DATA:  Ventilator dependence. EXAM: PORTABLE CHEST 1 VIEW COMPARISON:  05/15/2024 FINDINGS: Endotracheal tube tip is approximately 5 cm above the base of the carina. A feeding tube passes into the stomach although the distal tip position is not included on the film. Lungs are hyperexpanded. Diffuse chronic interstitial opacity is associated with patchy parenchymal opacity in the left upper lung, similar to prior. Scarring or trace effusion noted at the left base. Telemetry leads overlie the chest. IMPRESSION: 1. Endotracheal tube tip is approximately 5 cm above the base of the carina. 2. Chronic interstitial opacity with patchy parenchymal opacity in the left upper lung, similar to prior. Pneumonia in the left upper lung is not excluded. Electronically Signed   By: Camellia Candle M.D.   On: 05/17/2024 09:00    ------  Assessment:  79 y.o. male with hx above who urology is consulted on for concern for perineal/scrotal abscess. Worsening leukocytosis, continued fevers, and critically ill status with imaging concerning for fluid collection in the perineum/scrotum all support drainage. On exam at the bedside I am unable to clearly palpate a discrete fluid collection and so do not feel that a bedside drainage would be appropriate. Would plan to make patient NPO for potential add on for operative drainage tomorrow   Recommendations: - Please make patient NPO for consideration of possible OR drainage of perineal/scrotal fluid collection tomorrow   Thank you for this consult. Please contact the urology consult pager with any further questions/concerns.  I have seen  and examined the patient and agree with the above assessment and plan.  Pt with perineal abscess with persistent fevers. Recommend emergent incision and drainage, foley placement. Discussed with general surgery who will be available given close proximity to rectum. Unable to get a hold of any family member. Per the ICU physician, they have not been able to contact any family member during his entire admission thus far. We called his friend, [name] in the chart to obtain consent for incision and drainage of abscess.  Matt R. Aanshi Batchelder MD [facility]  Pager: [phone]

## 2024-05-19 NOTE — Progress Notes (Signed)
   05/19/24 0700  Restraint Order  Length of Order Daily    Patient off the unit in the OR

## 2024-05-20 ENCOUNTER — Other Ambulatory Visit: Payer: Self-pay

## 2024-05-20 DIAGNOSIS — J441 Chronic obstructive pulmonary disease with (acute) exacerbation: Secondary | ICD-10-CM | POA: Diagnosis not present

## 2024-05-20 DIAGNOSIS — G9341 Metabolic encephalopathy: Secondary | ICD-10-CM | POA: Diagnosis not present

## 2024-05-20 DIAGNOSIS — A419 Sepsis, unspecified organism: Secondary | ICD-10-CM | POA: Diagnosis not present

## 2024-05-20 DIAGNOSIS — J9602 Acute respiratory failure with hypercapnia: Secondary | ICD-10-CM | POA: Diagnosis not present

## 2024-05-20 LAB — GLUCOSE, CAPILLARY
Glucose-Capillary: 133 mg/dL — ABNORMAL HIGH (ref 70–99)
Glucose-Capillary: 138 mg/dL — ABNORMAL HIGH (ref 70–99)
Glucose-Capillary: 195 mg/dL — ABNORMAL HIGH (ref 70–99)
Glucose-Capillary: 206 mg/dL — ABNORMAL HIGH (ref 70–99)
Glucose-Capillary: 216 mg/dL — ABNORMAL HIGH (ref 70–99)
Glucose-Capillary: 278 mg/dL — ABNORMAL HIGH (ref 70–99)
Glucose-Capillary: 295 mg/dL — ABNORMAL HIGH (ref 70–99)

## 2024-05-20 LAB — CBC WITH DIFFERENTIAL/PLATELET
Abs Immature Granulocytes: 0.14 K/uL — ABNORMAL HIGH (ref 0.00–0.07)
Basophils Absolute: 0 K/uL (ref 0.0–0.1)
Basophils Relative: 0 %
Eosinophils Absolute: 0.3 K/uL (ref 0.0–0.5)
Eosinophils Relative: 2 %
HCT: 39.8 % (ref 39.0–52.0)
Hemoglobin: 11.8 g/dL — ABNORMAL LOW (ref 13.0–17.0)
Immature Granulocytes: 1 %
Lymphocytes Relative: 4 %
Lymphs Abs: 0.6 K/uL — ABNORMAL LOW (ref 0.7–4.0)
MCH: 27.5 pg (ref 26.0–34.0)
MCHC: 29.6 g/dL — ABNORMAL LOW (ref 30.0–36.0)
MCV: 92.8 fL (ref 80.0–100.0)
Monocytes Absolute: 0.7 K/uL (ref 0.1–1.0)
Monocytes Relative: 4 %
Neutro Abs: 15.6 K/uL — ABNORMAL HIGH (ref 1.7–7.7)
Neutrophils Relative %: 89 %
Platelets: 147 K/uL — ABNORMAL LOW (ref 150–400)
RBC: 4.29 MIL/uL (ref 4.22–5.81)
RDW: 16.1 % — ABNORMAL HIGH (ref 11.5–15.5)
WBC: 17.3 K/uL — ABNORMAL HIGH (ref 4.0–10.5)
nRBC: 0 % (ref 0.0–0.2)

## 2024-05-20 LAB — PHOSPHORUS: Phosphorus: 3.3 mg/dL (ref 2.5–4.6)

## 2024-05-20 LAB — BASIC METABOLIC PANEL WITH GFR
Anion gap: 7 (ref 5–15)
BUN: 81 mg/dL — ABNORMAL HIGH (ref 8–23)
CO2: 27 mmol/L (ref 22–32)
Calcium: 8.2 mg/dL — ABNORMAL LOW (ref 8.9–10.3)
Chloride: 113 mmol/L — ABNORMAL HIGH (ref 98–111)
Creatinine, Ser: 2.36 mg/dL — ABNORMAL HIGH (ref 0.61–1.24)
GFR, Estimated: 28 mL/min — ABNORMAL LOW (ref >60)
Glucose, Bld: 199 mg/dL — ABNORMAL HIGH (ref 70–99)
Potassium: 4.5 mmol/L (ref 3.5–5.1)
Sodium: 147 mmol/L — ABNORMAL HIGH (ref 135–145)

## 2024-05-20 LAB — MAGNESIUM: Magnesium: 2.4 mg/dL (ref 1.7–2.4)

## 2024-05-20 MED ORDER — PIPERACILLIN-TAZOBACTAM 3.375 G IVPB
3.3750 g | Freq: Three times a day (TID) | INTRAVENOUS | Status: AC
  Administered 2024-05-20 – 2024-05-26 (×20): 3.375 g via INTRAVENOUS
  Filled 2024-05-20 (×20): qty 50

## 2024-05-20 MED ORDER — OXYCODONE HCL 5 MG PO TABS
5.0000 mg | ORAL_TABLET | ORAL | Status: DC | PRN
  Administered 2024-05-20 – 2024-05-30 (×6): 5 mg via ORAL
  Filled 2024-05-20 (×6): qty 1

## 2024-05-20 NOTE — Progress Notes (Signed)
 PT's ICU bedside monitor started alarming for ST elevation in lead II and aVF. A 12 lead EKG was obtained which reported Normal sinus rhythm, low voltage QRS, borderline ECG.

## 2024-05-20 NOTE — Progress Notes (Signed)
 1 Day Post-Op Subjective: Pt currently intubated.  NAEO  Objective: Vital signs in last 24 hours: Temp:  [97.6 F (36.4 C)-99.5 F (37.5 C)] 98.8 F (37.1 C) (08/16 0719) Pulse Rate:  [75-207] 116 (08/16 0700) Resp:  [15-27] 19 (08/16 0700) BP: (84-145)/(51-85) 95/85 (08/16 0700) SpO2:  [76 %-100 %] 97 % (08/16 0757) FiO2 (%):  [40 %] 40 % (08/16 0312) Weight:  [60.4 kg] 60.4 kg (08/16 0457)  Intake/Output from previous day: 08/15 0701 - 08/16 0700 In: 3096.2 [I.V.:366.3; NG/GT:1980.8; IV Piggyback:729.1] Out: 1400 [Urine:1100; Stool:300]  Intake/Output this shift: Total I/O In: 200 [NG/GT:200] Out: -   Physical Exam:  General: Intubated but alert GU;  Erythematous scrotal/pelvic skin.  Scrotal incision is packed with dressing with no surrounding necrosis or crepitus.  No purulent drainage from incision  Lab Results: Recent Labs    05/18/24 0451 05/19/24 0339 05/20/24 0519  HGB 13.0 12.0* 11.8*  HCT 43.9 41.9 39.8   BMET Recent Labs    05/19/24 0339 05/20/24 0519  NA 147* 147*  K 5.0 4.5  CL 113* 113*  CO2 25 27  GLUCOSE 296* 199*  BUN 96* 81*  CREATININE 2.62* 2.36*  CALCIUM  8.2* 8.2*     Studies/Results: CT PELVIS W CONTRAST Result Date: 05/18/2024 CLINICAL DATA:  Concern for Fournier's gangrene. EXAM: CT PELVIS WITH CONTRAST TECHNIQUE: Multidetector CT imaging of the pelvis was performed using the standard protocol following the bolus administration of intravenous contrast. RADIATION DOSE REDUCTION: This exam was performed according to the departmental dose-optimization program which includes automated exposure control, adjustment of the mA and/or kV according to patient size and/or use of iterative reconstruction technique. CONTRAST:  75mL OMNIPAQUE  IOHEXOL  350 MG/ML SOLN COMPARISON:  None Available. FINDINGS: Urinary Tract: Complex right renal cyst containing a thin septation is partially imaged measuring up to 7 cm. There is mild posterior left bladder  wall thickening. No bladder calculi are seen. Bowel: Rectal tube in place. There is sigmoid colon diverticulosis. The appendix is normal. No dilated bowel loops are visualized. Vascular/Lymphatic: There is occlusion of the visualized right superficial femoral artery. Otherwise, pelvic arterial structures appear patent. There is severe atherosclerotic disease present. No enlarged lymph nodes are seen. Reproductive: There is a 9.4 x 3.5 by 6.8 cm fluid collection in the inferior right perineum extending to the level of the testicles. This abuts the base of the penis and is inferior to the pelvic floor. There is diffuse scrotal wall edema. Other: There is a small fat containing right inguinal hernia. There is edema and swelling in the right inguinal region. Musculoskeletal: Degenerative changes affect the spine. IMPRESSION: 1. 9.4 x 3.5 x 6.8 cm fluid collection in the inferior right perineum extending to the level of the testicles. This is worrisome for abscess. 2. Diffuse scrotal wall edema. 3. Mild posterior left bladder wall thickening. Correlate clinically for cystitis. 4. Occlusion of the visualized right superficial femoral artery. 5. Complex right renal cyst measuring up to 7 cm. Recommend further evaluation with renal ultrasound. 6. Small fat containing right inguinal hernia. Aortic Atherosclerosis (ICD10-I70.0). Electronically Signed   By: Greig Pique M.D.   On: 05/18/2024 23:28    Procedure:  Scrotal dressing change/wound packing  - Previous wound packing was removed intact and discarded.  Betadine soaked Kerlix gauze was then used to pack the scrotal wound cavity until completely filled.  Assessment/Plan: POD 1 s/p perineal abscess I&D with Dr. Selma  -Scrotal/perineal wound repacked at bedside today.  Continue to monitor  new onset scrotal/pelvic skin erythema.  Nursing staff instructed to demarcate erythematous area.  Continue Zosyn  and linezolid .  Will continue to monitor.   LOS: 8 days    Lonni Han, MD [facility] Pager: [phone]  05/20/2024, 9:16 AM

## 2024-05-20 NOTE — Plan of Care (Signed)
  Problem: Respiratory: Goal: Ability to maintain a clear airway and adequate ventilation will improve Outcome: Progressing   Problem: Clinical Measurements: Goal: Ability to maintain clinical measurements within normal limits will improve Outcome: Progressing   Problem: Clinical Measurements: Goal: Will remain free from infection Outcome: Progressing   Problem: Clinical Measurements: Goal: Diagnostic test results will improve Outcome: Progressing   Problem: Clinical Measurements: Goal: Respiratory complications will improve Outcome: Progressing   Problem: Clinical Measurements: Goal: Cardiovascular complication will be avoided Outcome: Progressing   Problem: Nutrition: Goal: Adequate nutrition will be maintained Outcome: Progressing   Problem: Pain Managment: Goal: General experience of comfort will improve and/or be controlled Outcome: Progressing   Problem: Elimination: Goal: Will not experience complications related to urinary retention Outcome: Progressing   Problem: Safety: Goal: Ability to remain free from injury will improve Outcome: Progressing   Problem: Skin Integrity: Goal: Risk for impaired skin integrity will decrease Outcome: Progressing   Problem: Safety: Goal: Non-violent Restraint(s) Outcome: Progressing    Plan of care, assessment, treatment, monitoring and intervention (s) ongoing, see MAR see [email or web address]

## 2024-05-20 NOTE — Progress Notes (Signed)
 NAME:  Clarence Maldonado, MRN:  [id number], DOB:  08/24/1946, LOS: 8 ADMISSION DATE:  05/12/2024, CONSULTATION DATE:  05/12/2024 REFERRING MD:  Cottie - EDP, CHIEF COMPLAINT:  Acute respiratory failure  History of Present Illness:  78 year old man who presented to [facility] 8/8 for acute respiratory failure. PMHx significant for HTN, HLD, COPD, CKD 3b, DMT2, BPH, R AKA, anxiety/depression.  Patient resides at rehab facility. Having SOB on 8/8 and prescribed rocephin /azithro. Worsening SOB. EMS called found in severe resp distress unresponsive. LMA placed and being bagged en route to [facility] ED. Wheezing b/l. Patient intubated on arrival. BP became soft post intubation requiring Levo. VBG 7.22/64/41/26. Labs pending. Given nebs, IV steroids, Mg. PCCM consulted for ICU admission.  Prior Echo in 2021 with EF of 30 to 35%. History of COPD. On triple therapy. No recent hospitalization for COPD.  Pertinent  Medical History   Past Medical History:  Diagnosis Date   Abnormal CT of the chest 2009   nodule 9 x 6 mm RUL   Benign localized hyperplasia of prostate with urinary obstruction and other lower urinary tract symptoms (LUTS)(600.21)    Cardiomegaly    Cerebrovascular accident (stroke) (HCC) 2009   lacunar infarct in the left thalamus   Cholelithiasis    Chronic airway obstruction, not elsewhere classified    Chronic kidney disease, stage III (moderate) (HCC)    Closed femur fracture (HCC) 1960   Contact with or exposure to viral disease    suspected    Illiterate    Obesity, unspecified    Other and unspecified hyperlipidemia    Special screening examination for respiratory tuberculosis    Tobacco use disorder    Tuberculosis    Type II or unspecified type diabetes mellitus with renal manifestations, uncontrolled(250.42)    Unspecified essential hypertension    Significant Hospital Events: Including procedures, antibiotic start and stop dates in addition to other pertinent events   8/8 - Admit  w/ COPD, HMPV infection, intubated. 8/13 - Weaning on vent, PSV 10/5, mild tachypnea. Remains on Precedex . 8/15 Developed perineal abscess.  Underwent cystoscopy and I&D with purulent drainage, wound packed  Interim History / Subjective:   Remains on the ventilator, off pressors On PSV weans  Objective:   Blood pressure 95/85, pulse (!) 116, temperature 98.8 F (37.1 C), temperature source Axillary, resp. rate 19, height 5' 5 (1.651 m), weight 60.4 kg, SpO2 97%.    Vent Mode: PSV;CPAP FiO2 (%):  [40 %] 40 % Set Rate:  [20 bmp] 20 bmp Vt Set:  [490 mL] 490 mL PEEP:  [5 cmH20] 5 cmH20 Pressure Support:  [12 cmH20] 12 cmH20 Plateau Pressure:  [14 cmH20-18 cmH20] 16 cmH20   Intake/Output Summary (Last 24 hours) at 05/20/2024 0906 Last data filed at 05/20/2024 0800 Gross per 24 hour  Intake 2936.69 ml  Output 1400 ml  Net 1536.69 ml   Filed Weights   05/18/24 0417 05/19/24 0452 05/20/24 0457  Weight: 61.7 kg 61.7 kg 60.4 kg   Physical Examination: Gen:      No acute distress, chronically ill HEENT:  EOMI, sclera anicteric Neck:     No masses; no thyromegaly, ET tube Lungs:    Clear to auscultation bilaterally; normal respiratory effort CV:         Regular rate and rhythm; no murmurs Abd:      + bowel sounds; soft, non-tender; no palpable masses, no distension Ext:    Right AKA, scrotal swelling with dressing Neuro: Opens eyes  to command on the ventilator.  Does not follow commands.  t   Lab/imaging reviewed Significant for sodium 147, BUN/creatinine 81/2.36 WBC 17.3, hemoglobin 11.8, platelets 147   Resolved Problem List:  Likely sedation induced hypotension  Assessment and Plan:   Acute respiratory failure w/ hypercapnia COPD exacerbation Viral pneumonia PsA on Resp Cx RVP metapneumovirus positive; PCT elevated 0.8 possible bacterial component with Resp Cx +PsA. Continue vent support, pressure support weans as tolerated On Brovana , Pulmicort , Yupelri   nebs Follow intermittent chest x-ray  Acute encephalopathy, likely metabolic and also sedating medications Reassuring Head CT; likely hypercapnia/sepsis related. Mild hyperammonemia - Limit sedating medications as able - Correct electrolyte derangements - Avoids interruptions to normal sleep/wake cycle as able  Perineal abscess with scrotal erythema/edema CT Pelvis 8/14 with 9.4 x 3.5 x 6.8cm fluid collection in inferior R perineum extending to the level of the testicles c/f abscess with diffuse scrotal wall edema and occlusion of the visualized R SFA. - POD#1 from I&D, cystoscopy.  Appreciate urology management Continue broad antibiotic coverage with Zosyn , linezolid .  Follow cultures Wound care  Sepsis, likely 2/2 to pneumonia, perineal abscess and possible UTI w/ large leukocytes BCx 8/8 with 1/4 w/ staph hominis; RVP positive for metapneumovirus, Resp Cx 8/11 +PsA. Off pressors.  Continue antibiotics and follow cultures as above.  AKI on CKD 3b, likely in the setting of septic ATN, improving Anion gap metabolic acidosis, slightly elevated lactate Hypernatremia Follow urine output and creatinine Repeat Lokelma  if potassium is elevated today  DMT2 Amaryl  and Farxiga  are both on hold. SSI, tube feed coverage  HTN: - Hold home antihypertensives in the setting of soft BP - Cardiac monitoring  HLD - Continue statin  BPH - Resume Flomax  as able  Anxiety/depression  - Resume home Wellbutrin  as able  Best Practice (right click and Reselect all SmartList Selections daily)   Diet/type: tubefeeds DVT prophylaxis prophylactic heparin   Pressure ulcer(s): N/A GI prophylaxis: H2B Lines: N/A Foley:  Yes, and it is still needed Code Status:  full code Last date of multidisciplinary goals of care discussion [8/14 friend Norleen updated over phone]  Critical care time:    The patient is critically ill with multiple organ system failure and requires high complexity decision  making for assessment and support, frequent evaluation and titration of therapies, advanced monitoring, review of radiographic studies and interpretation of complex data.   Critical Care Time devoted to patient care services, exclusive of separately billable procedures, described in this note is 36 minutes.  Lonna Coder, MD [facility] 05/20/24 9:06 AM  Please see [email or web address] for pager details.  From 7A-7P if no response, please call [phone] After hours, please call ELink [phone]

## 2024-05-21 ENCOUNTER — Inpatient Hospital Stay (HOSPITAL_COMMUNITY)

## 2024-05-21 DIAGNOSIS — J9602 Acute respiratory failure with hypercapnia: Secondary | ICD-10-CM | POA: Diagnosis not present

## 2024-05-21 DIAGNOSIS — J441 Chronic obstructive pulmonary disease with (acute) exacerbation: Secondary | ICD-10-CM | POA: Diagnosis not present

## 2024-05-21 DIAGNOSIS — G9341 Metabolic encephalopathy: Secondary | ICD-10-CM | POA: Diagnosis not present

## 2024-05-21 DIAGNOSIS — A419 Sepsis, unspecified organism: Secondary | ICD-10-CM | POA: Diagnosis not present

## 2024-05-21 LAB — POCT I-STAT 7, (LYTES, BLD GAS, ICA,H+H)
Acid-Base Excess: 0 mmol/L (ref 0.0–2.0)
Acid-Base Excess: 0 mmol/L (ref 0.0–2.0)
Bicarbonate: 29.2 mmol/L — ABNORMAL HIGH (ref 20.0–28.0)
Bicarbonate: 27 mmol/L (ref 20.0–28.0)
Calcium, Ion: 1.2 mmol/L (ref 1.15–1.40)
Calcium, Ion: 1.17 mmol/L (ref 1.15–1.40)
HCT: 38 % — ABNORMAL LOW (ref 39.0–52.0)
HCT: 36 % — ABNORMAL LOW (ref 39.0–52.0)
Hemoglobin: 12.9 g/dL — ABNORMAL LOW (ref 13.0–17.0)
Hemoglobin: 12.2 g/dL — ABNORMAL LOW (ref 13.0–17.0)
O2 Saturation: 95 %
O2 Saturation: 98 %
Patient temperature: 99
Patient temperature: 99
Potassium: 5.9 mmol/L — ABNORMAL HIGH (ref 3.5–5.1)
Potassium: 5.7 mmol/L — ABNORMAL HIGH (ref 3.5–5.1)
Sodium: 139 mmol/L (ref 135–145)
Sodium: 140 mmol/L (ref 135–145)
TCO2: 31 mmol/L (ref 22–32)
TCO2: 28 mmol/L (ref 22–32)
pCO2 arterial: 73.3 mmHg (ref 32–48)
pCO2 arterial: 51.8 mmHg — ABNORMAL HIGH (ref 32–48)
pH, Arterial: 7.21 — ABNORMAL LOW (ref 7.35–7.45)
pH, Arterial: 7.325 — ABNORMAL LOW (ref 7.35–7.45)
pO2, Arterial: 96 mmHg (ref 83–108)
pO2, Arterial: 125 mmHg — ABNORMAL HIGH (ref 83–108)

## 2024-05-21 LAB — GLUCOSE, CAPILLARY
Glucose-Capillary: 194 mg/dL — ABNORMAL HIGH (ref 70–99)
Glucose-Capillary: 210 mg/dL — ABNORMAL HIGH (ref 70–99)
Glucose-Capillary: 235 mg/dL — ABNORMAL HIGH (ref 70–99)
Glucose-Capillary: 334 mg/dL — ABNORMAL HIGH (ref 70–99)
Glucose-Capillary: 362 mg/dL — ABNORMAL HIGH (ref 70–99)
Glucose-Capillary: 383 mg/dL — ABNORMAL HIGH (ref 70–99)

## 2024-05-21 LAB — BASIC METABOLIC PANEL WITH GFR
Anion gap: 9 (ref 5–15)
BUN: 73 mg/dL — ABNORMAL HIGH (ref 8–23)
CO2: 27 mmol/L (ref 22–32)
Calcium: 8.1 mg/dL — ABNORMAL LOW (ref 8.9–10.3)
Chloride: 109 mmol/L (ref 98–111)
Creatinine, Ser: 2.4 mg/dL — ABNORMAL HIGH (ref 0.61–1.24)
GFR, Estimated: 27 mL/min — ABNORMAL LOW (ref >60)
Glucose, Bld: 234 mg/dL — ABNORMAL HIGH (ref 70–99)
Potassium: 5.2 mmol/L — ABNORMAL HIGH (ref 3.5–5.1)
Sodium: 145 mmol/L (ref 135–145)

## 2024-05-21 LAB — CBC
HCT: 43.4 % (ref 39.0–52.0)
Hemoglobin: 12.1 g/dL — ABNORMAL LOW (ref 13.0–17.0)
MCH: 26.2 pg (ref 26.0–34.0)
MCHC: 27.9 g/dL — ABNORMAL LOW (ref 30.0–36.0)
MCV: 94.1 fL (ref 80.0–100.0)
Platelets: 161 K/uL (ref 150–400)
RBC: 4.61 MIL/uL (ref 4.22–5.81)
RDW: 16.1 % — ABNORMAL HIGH (ref 11.5–15.5)
WBC: 15.3 K/uL — ABNORMAL HIGH (ref 4.0–10.5)
nRBC: 0 % (ref 0.0–0.2)

## 2024-05-21 MED ORDER — SODIUM ZIRCONIUM CYCLOSILICATE 10 G PO PACK
10.0000 g | PACK | Freq: Once | ORAL | Status: AC
  Administered 2024-05-21: 10 g
  Filled 2024-05-21: qty 1

## 2024-05-21 MED ORDER — INSULIN GLARGINE-YFGN 100 UNIT/ML ~~LOC~~ SOLN
15.0000 [IU] | Freq: Every day | SUBCUTANEOUS | Status: DC
  Filled 2024-05-21: qty 0.15

## 2024-05-21 MED ORDER — SODIUM CHLORIDE 0.9 % IV SOLN
100.0000 mg | INTRAVENOUS | Status: AC
  Administered 2024-05-21 – 2024-05-27 (×7): 100 mg via INTRAVENOUS
  Filled 2024-05-21 (×7): qty 5

## 2024-05-21 MED ORDER — METHYLPREDNISOLONE SODIUM SUCC 40 MG IJ SOLR
40.0000 mg | Freq: Every day | INTRAMUSCULAR | Status: DC
  Administered 2024-05-21: 40 mg via INTRAVENOUS
  Filled 2024-05-21: qty 1

## 2024-05-21 MED ORDER — INSULIN GLARGINE-YFGN 100 UNIT/ML ~~LOC~~ SOLN
15.0000 [IU] | Freq: Every day | SUBCUTANEOUS | Status: DC
  Administered 2024-05-21: 15 [IU] via SUBCUTANEOUS
  Filled 2024-05-21 (×2): qty 0.15

## 2024-05-21 MED ORDER — PROPOFOL 1000 MG/100ML IV EMUL
0.0000 ug/kg/min | INTRAVENOUS | Status: DC
  Administered 2024-05-21 – 2024-05-22 (×2): 20 ug/kg/min via INTRAVENOUS
  Administered 2024-05-22: 25 ug/kg/min via INTRAVENOUS
  Administered 2024-05-23: 20 ug/kg/min via INTRAVENOUS
  Administered 2024-05-24: 25 ug/kg/min via INTRAVENOUS
  Administered 2024-05-24: 10 ug/kg/min via INTRAVENOUS
  Administered 2024-05-25: 15 ug/kg/min via INTRAVENOUS
  Filled 2024-05-21 (×9): qty 100

## 2024-05-21 MED ORDER — METHYLPREDNISOLONE SODIUM SUCC 40 MG IJ SOLR
40.0000 mg | Freq: Every day | INTRAMUSCULAR | Status: AC
  Administered 2024-05-22 – 2024-05-25 (×4): 40 mg via INTRAVENOUS
  Filled 2024-05-21 (×4): qty 1

## 2024-05-21 NOTE — Plan of Care (Signed)
  Problem: Respiratory: Goal: Ability to maintain a clear airway and adequate ventilation will improve Outcome: Progressing   Problem: Clinical Measurements: Goal: Ability to maintain clinical measurements within normal limits will improve Outcome: Progressing   Problem: Clinical Measurements: Goal: Will remain free from infection Outcome: Progressing   Problem: Clinical Measurements: Goal: Diagnostic test results will improve Outcome: Progressing   Problem: Clinical Measurements: Goal: Respiratory complications will improve Outcome: Progressing   Problem: Elimination: Goal: Will not experience complications related to bowel motility Outcome: Progressing   Problem: Pain Managment: Goal: General experience of comfort will improve and/or be controlled Outcome: Progressing   Problem: Safety: Goal: Ability to remain free from injury will improve Outcome: Progressing   Problem: Skin Integrity: Goal: Risk for impaired skin integrity will decrease Outcome: Progressing   Problem: Safety: Goal: Non-violent Restraint(s) Outcome: Progressing

## 2024-05-21 NOTE — Progress Notes (Signed)
 2 Days Post-Op Subjective: Pt currently intubated. NAEO   Objective: Vital signs in last 24 hours: Temp:  [97.7 F (36.5 C)-99.7 F (37.6 C)] 99 F (37.2 C) (08/17 1204) Pulse Rate:  [52-83] 81 (08/17 1215) Resp:  [17-37] 26 (08/17 1215) BP: (65-143)/(47-92) 100/55 (08/17 1215) SpO2:  [95 %-100 %] 98 % (08/17 1215) FiO2 (%):  [40 %] 40 % (08/17 1150)  Intake/Output from previous day: 08/16 0701 - 08/17 0700 In: 3589 [I.V.:217; NG/GT:2640; IV Piggyback:732] Out: 1290 [Urine:925; Stool:365]  Intake/Output this shift: Total I/O In: 1177 [I.V.:88.6; NG/GT:795; IV Piggyback:293.4] Out: -   Physical Exam:  General: Intubated but alert GU;  Erythematous scrotal/pelvic skin has improved from prior exam.  Scrotal incision is packed with dressing with no surrounding necrosis or crepitus.  No purulent drainage from incision  Lab Results: Recent Labs    05/20/24 0519 05/21/24 0313 05/21/24 1204  HGB 11.8* 12.1* 12.9*  HCT 39.8 43.4 38.0*   BMET Recent Labs    05/20/24 0519 05/21/24 0313 05/21/24 1204  NA 147* 145 139  K 4.5 5.2* 5.9*  CL 113* 109  --   CO2 27 27  --   GLUCOSE 199* 234*  --   BUN 81* 73*  --   CREATININE 2.36* 2.40*  --   CALCIUM  8.2* 8.1*  --      Studies/Results: No results found.  Procedure:  Scrotal dressing change/wound packing   - Previous wound packing was removed intact and discarded.  Betadine soaked Kerlix gauze was then used to pack the scrotal wound cavity until completely filled.  Assessment/Plan: POD 2 s/p perineal abscess I&D with Dr. Selma   -Scrotal/perineal wound repacked at bedside today.  Scrotal/pelvic skin erythema improving.  Nursing staff instructed to demarcate erythematous area.  Continue Zosyn  and linezolid .  Will continue to monitor.   LOS: 9 days   Lonni Han, MD [facility] Pager: [phone]  05/21/2024, 12:28 PM

## 2024-05-21 NOTE — Plan of Care (Signed)
  Problem: Respiratory: Goal: Ability to maintain a clear airway and adequate ventilation will improve Outcome: Progressing   Problem: Clinical Measurements: Goal: Ability to maintain clinical measurements within normal limits will improve Outcome: Progressing Goal: Will remain free from infection Outcome: Progressing Goal: Diagnostic test results will improve Outcome: Progressing Goal: Respiratory complications will improve Outcome: Progressing Goal: Cardiovascular complication will be avoided Outcome: Progressing   Problem: Nutrition: Goal: Adequate nutrition will be maintained Outcome: Progressing

## 2024-05-21 NOTE — Progress Notes (Addendum)
 NAME:  Clarence Maldonado, MRN:  [id number], DOB:  October 08, 1945, LOS: 9 ADMISSION DATE:  05/12/2024, CONSULTATION DATE:  05/12/2024 REFERRING MD:  Cottie - EDP, CHIEF COMPLAINT:  Acute respiratory failure  History of Present Illness:  78 year old man who presented to [facility] 8/8 for acute respiratory failure. PMHx significant for HTN, HLD, COPD, CKD 3b, DMT2, BPH, R AKA, anxiety/depression.  Patient resides at rehab facility. Having SOB on 8/8 and prescribed rocephin zenovia. Worsening SOB. EMS called found in severe resp distress unresponsive. LMA placed and being bagged en route to [facility] ED. Wheezing b/l. Patient intubated on arrival. BP became soft post intubation requiring Levo. VBG 7.22/64/41/26. Labs pending. Given nebs, IV steroids, Mg. PCCM consulted for ICU admission.  Prior Echo in 2021 with EF of 30 to 35%. History of COPD. On triple therapy. No recent hospitalization for COPD.  Pertinent  Medical History   Past Medical History:  Diagnosis Date   Abnormal CT of the chest 2009   nodule 9 x 6 mm RUL   Benign localized hyperplasia of prostate with urinary obstruction and other lower urinary tract symptoms (LUTS)(600.21)    Cardiomegaly    Cerebrovascular accident (stroke) (HCC) 2009   lacunar infarct in the left thalamus   Cholelithiasis    Chronic airway obstruction, not elsewhere classified    Chronic kidney disease, stage III (moderate) (HCC)    Closed femur fracture (HCC) 1960   Contact with or exposure to viral disease    suspected    Illiterate    Obesity, unspecified    Other and unspecified hyperlipidemia    Special screening examination for respiratory tuberculosis    Tobacco use disorder    Tuberculosis    Type II or unspecified type diabetes mellitus with renal manifestations, uncontrolled(250.42)    Unspecified essential hypertension    Significant Hospital Events: Including procedures, antibiotic start and stop dates in addition to other pertinent events   8/8 - Admit  w/ COPD, HMPV infection, intubated. 8/13 - Weaning on vent, PSV 10/5, mild tachypnea. Remains on Precedex . 8/15 Developed perineal abscess.  Underwent cystoscopy and I&D with purulent drainage, wound packed 8/16 failing PSV  Interim History / Subjective:   Failed PSV weans due to tachypnea.  On low-dose Levophed .  Morning.  Objective:   Blood pressure (!) 79/47, pulse 74, temperature 98.8 F (37.1 C), temperature source Oral, resp. rate (!) 27, height 5' 5 (1.651 m), weight 60.4 kg, SpO2 99%.    Vent Mode: PRVC FiO2 (%):  [40 %] 40 % Set Rate:  [20 bmp] 20 bmp Vt Set:  [490 mL] 490 mL PEEP:  [5 cmH20] 5 cmH20 Plateau Pressure:  [15 cmH20-19 cmH20] 19 cmH20   Intake/Output Summary (Last 24 hours) at 05/21/2024 0814 Last data filed at 05/21/2024 0700 Gross per 24 hour  Intake 3388.96 ml  Output 1290 ml  Net 2098.96 ml   Filed Weights   05/18/24 0417 05/19/24 0452 05/20/24 0457  Weight: 61.7 kg 61.7 kg 60.4 kg   Physical Examination: Gen:      Chronically ill-appearing HEENT:  EOMI, sclera anicteric Neck:     No masses; no thyromegaly, ET tube Lungs:    Mild expiratory wheeze CV:         Regular rate and rhythm; no murmurs Abd:      + bowel sounds; soft, non-tender; no palpable masses, no distension Ext:    Right AKA, scrotal dressing.  Erythema improved compared to yesterday Neuro: Sedated, unresponsive  Lab/imaging reviewed  Significant for potassium 5.2, BUN/creatinine 73/2.40 WBC improved to 15.3 No new imaging  Resolved Problem List:  Likely sedation induced hypotension  Assessment and Plan:   Acute respiratory failure w/ hypercapnia COPD exacerbation Viral pneumonia PsA on Resp Cx RVP metapneumovirus positive; PCT elevated 0.8 possible bacterial component with Resp Cx +PsA. Continue vent support, pressure support weans as tolerated On Brovana , Pulmicort , Yupelri  nebs Add Solu-Medrol  40 mg due to difficulty breathing with wheezing heard Follow  intermittent chest x-ray  Acute encephalopathy, likely metabolic and also sedating medications Reassuring Head CT; likely hypercapnia/sepsis related. Mild hyperammonemia - Limit sedating medications as able - Correct electrolyte derangements - Avoids interruptions to normal sleep/wake cycle as able  Perineal abscess with scrotal erythema/edema CT Pelvis 8/14 with 9.4 x 3.5 x 6.8cm fluid collection in inferior R perineum extending to the level of the testicles c/f abscess with diffuse scrotal wall edema and occlusion of the visualized R SFA. - POD#1 from I&D, cystoscopy.  Appreciate urology management Continue broad antibiotic coverage with Zosyn , linezolid .  Follow cultures Add micafungin  for yeast on sputum culture Wound care  Sepsis, likely 2/2 to pneumonia, perineal abscess and possible UTI w/ large leukocytes BCx 8/8 with 1/4 w/ staph hominis; RVP positive for metapneumovirus, Resp Cx 8/11 +PsA. Wean pressors.  Continue antibiotics and follow cultures as above.  AKI on CKD 3b, likely in the setting of septic ATN, improving Anion gap metabolic acidosis, slightly elevated lactate Hypernatremia Follow urine output and creatinine Repeat Lokelma   DMT2 Amaryl  and Farxiga  are both on hold. SSI, tube feed coverage Add long acting insulin   HTN: - Hold home antihypertensives in the setting of soft BP - Cardiac monitoring  HLD - Continue statin  BPH - Resume Flomax  as able  Anxiety/depression  - Resume home Wellbutrin  as able  Best Practice (right click and Reselect all SmartList Selections daily)   Diet/type: tubefeeds DVT prophylaxis prophylactic heparin   Pressure ulcer(s): N/A GI prophylaxis: H2B Lines: N/A Foley:  Yes, and it is still needed Code Status:  full code Last date of multidisciplinary goals of care discussion [8/14 friend Norleen updated over phone]  Critical care time:    The patient is critically ill with multiple organ system failure and requires  high complexity decision making for assessment and support, frequent evaluation and titration of therapies, advanced monitoring, review of radiographic studies and interpretation of complex data.   Critical Care Time devoted to patient care services, exclusive of separately billable procedures, described in this note is 35 minutes.   Chassity Ludke MD [facility] See Amion for pager  If no response to pager , please call [phone] until 7pm After 7:00 pm call Elink  [phone] 05/21/2024, 8:19 AM

## 2024-05-22 ENCOUNTER — Encounter (HOSPITAL_COMMUNITY): Payer: Self-pay | Admitting: Urology

## 2024-05-22 DIAGNOSIS — A419 Sepsis, unspecified organism: Secondary | ICD-10-CM | POA: Diagnosis not present

## 2024-05-22 DIAGNOSIS — J441 Chronic obstructive pulmonary disease with (acute) exacerbation: Secondary | ICD-10-CM | POA: Diagnosis not present

## 2024-05-22 DIAGNOSIS — J9602 Acute respiratory failure with hypercapnia: Secondary | ICD-10-CM | POA: Diagnosis not present

## 2024-05-22 DIAGNOSIS — G9341 Metabolic encephalopathy: Secondary | ICD-10-CM | POA: Diagnosis not present

## 2024-05-22 LAB — CBC
HCT: 39.1 % (ref 39.0–52.0)
Hemoglobin: 11.3 g/dL — ABNORMAL LOW (ref 13.0–17.0)
MCH: 27 pg (ref 26.0–34.0)
MCHC: 28.9 g/dL — ABNORMAL LOW (ref 30.0–36.0)
MCV: 93.5 fL (ref 80.0–100.0)
Platelets: 144 K/uL — ABNORMAL LOW (ref 150–400)
RBC: 4.18 MIL/uL — ABNORMAL LOW (ref 4.22–5.81)
RDW: 15.9 % — ABNORMAL HIGH (ref 11.5–15.5)
WBC: 13.1 K/uL — ABNORMAL HIGH (ref 4.0–10.5)
nRBC: 0 % (ref 0.0–0.2)

## 2024-05-22 LAB — BASIC METABOLIC PANEL WITH GFR
Anion gap: 11 (ref 5–15)
BUN: 78 mg/dL — ABNORMAL HIGH (ref 8–23)
CO2: 24 mmol/L (ref 22–32)
Calcium: 8 mg/dL — ABNORMAL LOW (ref 8.9–10.3)
Chloride: 103 mmol/L (ref 98–111)
Creatinine, Ser: 2.4 mg/dL — ABNORMAL HIGH (ref 0.61–1.24)
GFR, Estimated: 27 mL/min — ABNORMAL LOW (ref >60)
Glucose, Bld: 419 mg/dL — ABNORMAL HIGH (ref 70–99)
Potassium: 5.6 mmol/L — ABNORMAL HIGH (ref 3.5–5.1)
Sodium: 138 mmol/L (ref 135–145)

## 2024-05-22 LAB — GLUCOSE, CAPILLARY
Glucose-Capillary: 238 mg/dL — ABNORMAL HIGH (ref 70–99)
Glucose-Capillary: 242 mg/dL — ABNORMAL HIGH (ref 70–99)
Glucose-Capillary: 256 mg/dL — ABNORMAL HIGH (ref 70–99)
Glucose-Capillary: 261 mg/dL — ABNORMAL HIGH (ref 70–99)
Glucose-Capillary: 266 mg/dL — ABNORMAL HIGH (ref 70–99)
Glucose-Capillary: 355 mg/dL — ABNORMAL HIGH (ref 70–99)

## 2024-05-22 LAB — TRIGLYCERIDES: Triglycerides: 103 mg/dL (ref <150)

## 2024-05-22 MED ORDER — INSULIN ASPART 100 UNIT/ML IV SOLN
8.0000 [IU] | Freq: Once | INTRAVENOUS | Status: AC
  Administered 2024-05-22: 8 [IU] via INTRAVENOUS

## 2024-05-22 MED ORDER — FREE WATER
200.0000 mL | Freq: Four times a day (QID) | Status: DC
  Administered 2024-05-22 – 2024-05-24 (×8): 200 mL

## 2024-05-22 MED ORDER — INSULIN GLARGINE 100 UNIT/ML ~~LOC~~ SOLN
20.0000 [IU] | Freq: Two times a day (BID) | SUBCUTANEOUS | Status: DC
  Administered 2024-05-22 – 2024-05-23 (×3): 20 [IU] via SUBCUTANEOUS
  Filled 2024-05-22 (×4): qty 0.2

## 2024-05-22 MED ORDER — SODIUM ZIRCONIUM CYCLOSILICATE 10 G PO PACK
10.0000 g | PACK | Freq: Once | ORAL | Status: AC
  Administered 2024-05-22: 10 g
  Filled 2024-05-22: qty 1

## 2024-05-22 MED ORDER — INSULIN ASPART 100 UNIT/ML IJ SOLN
8.0000 [IU] | INTRAMUSCULAR | Status: DC
  Administered 2024-05-22 – 2024-05-24 (×15): 8 [IU] via SUBCUTANEOUS

## 2024-05-22 MED ORDER — FUROSEMIDE 10 MG/ML IJ SOLN
20.0000 mg | Freq: Once | INTRAMUSCULAR | Status: AC
  Administered 2024-05-22: 20 mg via INTRAVENOUS
  Filled 2024-05-22: qty 2

## 2024-05-22 MED ORDER — INSULIN GLARGINE-YFGN 100 UNIT/ML ~~LOC~~ SOLN: 20.0000 [IU] | Freq: Two times a day (BID) | SUBCUTANEOUS | Status: DC

## 2024-05-22 NOTE — Plan of Care (Signed)
  Problem: Respiratory: Goal: Ability to maintain a clear airway and adequate ventilation will improve Outcome: Progressing   Problem: Clinical Measurements: Goal: Ability to maintain clinical measurements within normal limits will improve Outcome: Progressing   Problem: Elimination: Goal: Will not experience complications related to bowel motility Outcome: Progressing   Problem: Elimination: Goal: Will not experience complications related to urinary retention Outcome: Progressing   Problem: Pain Managment: Goal: General experience of comfort will improve and/or be controlled Outcome: Progressing   Problem: Safety: Goal: Ability to remain free from injury will improve Outcome: Progressing   Problem: Safety: Goal: Non-violent Restraint(s) Outcome: Progressing

## 2024-05-22 NOTE — Progress Notes (Signed)
 eLink Physician-Brief Progress Note Patient Name: Clarence Maldonado DOB: 1946-09-01 MRN: [id number]   Date of Service  05/22/2024  HPI/Events of Note  Hyperkalemia , K rising to 5.6 & hyperglycemia 400s  eICU Interventions   Repeat lokelma  Novolog  8 unit IV x 1     Intervention Category Major Interventions: Electrolyte abnormality - evaluation and management  Juliana Boling V. Paiton Fosco 05/22/2024, 3:31 AM

## 2024-05-22 NOTE — Progress Notes (Signed)
 NAME:  Clarence Maldonado, MRN:  [id number], DOB:  Oct 21, 1945, LOS: 10 ADMISSION DATE:  05/12/2024, CONSULTATION DATE:  05/12/2024 REFERRING MD:  Cottie - EDP, CHIEF COMPLAINT:  Acute respiratory failure  History of Present Illness:  78 year old man who presented to [facility] 8/8 for acute respiratory failure. PMHx significant for HTN, HLD, COPD, CKD 3b, DMT2, BPH, R AKA, anxiety/depression.  Patient resides at rehab facility. Having SOB on 8/8 and prescribed rocephin /azithro. Worsening SOB. EMS called found in severe resp distress unresponsive. LMA placed and being bagged en route to [facility] ED. Wheezing b/l. Patient intubated on arrival. BP became soft post intubation requiring Levo. VBG 7.22/64/41/26. Labs pending. Given nebs, IV steroids, Mg. PCCM consulted for ICU admission.  Prior Echo in 2021 with EF of 30 to 35%. History of COPD. On triple therapy. No recent hospitalization for COPD.  Pertinent  Medical History   Past Medical History:  Diagnosis Date   Abnormal CT of the chest 2009   nodule 9 x 6 mm RUL   Benign localized hyperplasia of prostate with urinary obstruction and other lower urinary tract symptoms (LUTS)(600.21)    Cardiomegaly    Cerebrovascular accident (stroke) (HCC) 2009   lacunar infarct in the left thalamus   Cholelithiasis    Chronic airway obstruction, not elsewhere classified    Chronic kidney disease, stage III (moderate) (HCC)    Closed femur fracture (HCC) 1960   Contact with or exposure to viral disease    suspected    Illiterate    Obesity, unspecified    Other and unspecified hyperlipidemia    Special screening examination for respiratory tuberculosis    Tobacco use disorder    Tuberculosis    Type II or unspecified type diabetes mellitus with renal manifestations, uncontrolled(250.42)    Unspecified essential hypertension    Significant Hospital Events: Including procedures, antibiotic start and stop dates in addition to other pertinent events   8/8 - Admit  w/ COPD, HMPV infection, intubated. 8/13 - Weaning on vent, PSV 10/5, mild tachypnea. Remains on Precedex . 8/15 Developed perineal abscess.  Underwent cystoscopy and I&D with purulent drainage, wound packed 8/16 Failing PSV, on low dose levoped  Interim History / Subjective:   Off pressors today morning.  Remains on the ventilator Got 2 doses of Lokelma  for hyperkalemia  Objective:   Blood pressure 118/73, pulse 85, temperature 97.9 F (36.6 C), temperature source Axillary, resp. rate (!) 25, height (P) 5' 5 (1.651 m), weight 60.4 kg, SpO2 100%.    Vent Mode: PRVC FiO2 (%):  [40 %] 40 % Set Rate:  [20 bmp-28 bmp] 28 bmp Vt Set:  [490 mL] 490 mL PEEP:  [5 cmH20] 5 cmH20 Pressure Support:  [12 cmH20] 12 cmH20 Plateau Pressure:  [18 cmH20-19 cmH20] 18 cmH20   Intake/Output Summary (Last 24 hours) at 05/22/2024 0743 Last data filed at 05/22/2024 0600 Gross per 24 hour  Intake 3989.93 ml  Output 1450 ml  Net 2539.93 ml   Filed Weights   05/18/24 0417 05/19/24 0452 05/20/24 0457  Weight: 61.7 kg 61.7 kg 60.4 kg   Physical Examination: Gen:      No acute distress, chronically ill-appearing HEENT:  EOMI, sclera anicteric Neck:     No masses; no thyromegaly, ET tube Lungs:    Clear to auscultation bilaterally; normal respiratory effort CV:         Regular rate and rhythm; no murmurs Abd:      + bowel sounds; soft, non-tender; no palpable  masses, no distension Ext:    Right AKA, scrotal dressing Neuro: Sedated, unresponsive  Lab/imaging reviewed Significant for potassium 5.6, BUN/creatinine 38/2.40 WBC improving to 13.1  Resolved Problem List:  Likely sedation induced hypotension  Assessment and Plan:   Acute respiratory failure w/ hypercapnia COPD exacerbation Viral pneumonia PsA on Resp Cx RVP metapneumovirus positive; PCT elevated 0.8 possible bacterial component with Resp Cx +PsA. Continue vent support, pressure support weans as tolerated On Brovana ,  Pulmicort , Yupelri  nebs On Solu-Medrol  40 mg since 8/17 due to difficulty breathing with wheezing heard Follow intermittent chest x-ray  Acute encephalopathy, likely metabolic and also sedating medications Reassuring Head CT; likely hypercapnia/sepsis related. Mild hyperammonemia - Limit sedating medications as able - Correct electrolyte derangements - Avoids interruptions to normal sleep/wake cycle as able  Perineal abscess with scrotal erythema/edema CT Pelvis 8/14 with 9.4 x 3.5 x 6.8cm fluid collection in inferior R perineum extending to the level of the testicles c/f abscess with diffuse scrotal wall edema and occlusion of the visualized R SFA. - POD#1 from I&D, cystoscopy.  Appreciate urology management Continue broad antibiotic coverage with Zosyn , linezolid .  Follow cultures Add micafungin  on 8/17 for yeast on sputum culture Wound care  Sepsis, likely 2/2 to pneumonia, perineal abscess and possible UTI w/ large leukocytes BCx 8/8 with 1/4 w/ staph hominis; RVP positive for metapneumovirus, Resp Cx 8/11 +PsA. Wean pressors.  Continue antibiotics and follow cultures as above.  AKI on CKD 3b, likely in the setting of septic ATN, improving Anion gap metabolic acidosis, slightly elevated lactate Hypernatremia, hyperkalemia Follow urine output and creatinine Lasix  20 mg x 1.  Repeat BMP to check potassium levels Continue free water , decrease dose of sodium is better  DMT2 Amaryl  and Farxiga  are both on hold. SSI,  Sugars higher due to steroids Increase tube feed coverage and long acting insulin   HTN: Hold home antihypertensives in the setting of soft BP Cardiac monitoring  HLD Continue statin  BPH Resume Flomax  as able  Anxiety/depression  Resume home Wellbutrin  as able  Best Practice (right click and Reselect all SmartList Selections daily)   Diet/type: tubefeeds DVT prophylaxis prophylactic heparin   Pressure ulcer(s): N/A GI prophylaxis: H2B Lines:  N/A Foley:  Yes, and it is still needed Code Status:  full code Last date of multidisciplinary goals of care discussion [8/14 friend Norleen updated over phone]  Critical care time:    The patient is critically ill with multiple organ system failure and requires high complexity decision making for assessment and support, frequent evaluation and titration of therapies, advanced monitoring, review of radiographic studies and interpretation of complex data.   Critical Care Time devoted to patient care services, exclusive of separately billable procedures, described in this note is 35 minutes.   Riyanna Crutchley MD [place] [facility] See Amion for pager  If no response to pager , please call [phone] until 7pm After 7:00 pm call Elink  [phone] 05/22/2024, 7:43 AM

## 2024-05-22 NOTE — Plan of Care (Signed)
  Problem: Respiratory: Goal: Ability to maintain a clear airway and adequate ventilation will improve Outcome: Progressing   Problem: Clinical Measurements: Goal: Ability to maintain clinical measurements within normal limits will improve Outcome: Progressing Goal: Respiratory complications will improve Outcome: Progressing

## 2024-05-22 NOTE — Plan of Care (Signed)
 Patient remains in critical condition and ventilator dependent in the ICU.  Nursing will defer his dressing change overnight.  I will do this with dayshift tomorrow.

## 2024-05-22 NOTE — Plan of Care (Signed)
  Problem: Activity: Goal: Ability to tolerate increased activity will improve 05/22/2024 2104 by Glendia Dena BIRCH, RN Outcome: Progressing 05/22/2024 2041 by Glendia Dena D, RN Outcome: Progressing   Problem: Respiratory: Goal: Ability to maintain a clear airway and adequate ventilation will improve 05/22/2024 2104 by Glendia Dena BIRCH, RN Outcome: Progressing 05/22/2024 2041 by Glendia Dena D, RN Outcome: Progressing   Problem: Nutrition: Goal: Adequate nutrition will be maintained 05/22/2024 2104 by Glendia Dena BIRCH, RN Outcome: Progressing 05/22/2024 2041 by Glendia Dena D, RN Outcome: Progressing   Problem: Elimination: Goal: Will not experience complications related to bowel motility 05/22/2024 2104 by Glendia Dena BIRCH, RN Outcome: Progressing 05/22/2024 2041 by Glendia Dena D, RN Outcome: Progressing   Problem: Skin Integrity: Goal: Risk for impaired skin integrity will decrease 05/22/2024 2104 by Glendia Dena D, RN Outcome: Progressing 05/22/2024 2041 by Glendia Dena BIRCH, RN Outcome: Progressing

## 2024-05-23 DIAGNOSIS — A419 Sepsis, unspecified organism: Secondary | ICD-10-CM | POA: Diagnosis not present

## 2024-05-23 DIAGNOSIS — J441 Chronic obstructive pulmonary disease with (acute) exacerbation: Secondary | ICD-10-CM | POA: Diagnosis not present

## 2024-05-23 DIAGNOSIS — J9602 Acute respiratory failure with hypercapnia: Secondary | ICD-10-CM | POA: Diagnosis not present

## 2024-05-23 DIAGNOSIS — G9341 Metabolic encephalopathy: Secondary | ICD-10-CM | POA: Diagnosis not present

## 2024-05-23 LAB — BASIC METABOLIC PANEL WITH GFR
Anion gap: 11 (ref 5–15)
Anion gap: 12 (ref 5–15)
Anion gap: 12 (ref 5–15)
BUN: 88 mg/dL — ABNORMAL HIGH (ref 8–23)
BUN: 90 mg/dL — ABNORMAL HIGH (ref 8–23)
BUN: 99 mg/dL — ABNORMAL HIGH (ref 8–23)
CO2: 25 mmol/L (ref 22–32)
CO2: 25 mmol/L (ref 22–32)
CO2: 27 mmol/L (ref 22–32)
Calcium: 7.9 mg/dL — ABNORMAL LOW (ref 8.9–10.3)
Calcium: 8.1 mg/dL — ABNORMAL LOW (ref 8.9–10.3)
Calcium: 8.2 mg/dL — ABNORMAL LOW (ref 8.9–10.3)
Chloride: 102 mmol/L (ref 98–111)
Chloride: 102 mmol/L (ref 98–111)
Chloride: 103 mmol/L (ref 98–111)
Creatinine, Ser: 2.45 mg/dL — ABNORMAL HIGH (ref 0.61–1.24)
Creatinine, Ser: 2.46 mg/dL — ABNORMAL HIGH (ref 0.61–1.24)
Creatinine, Ser: 2.52 mg/dL — ABNORMAL HIGH (ref 0.61–1.24)
GFR, Estimated: 26 mL/min — ABNORMAL LOW (ref >60)
GFR, Estimated: 26 mL/min — ABNORMAL LOW (ref >60)
GFR, Estimated: 26 mL/min — ABNORMAL LOW (ref >60)
Glucose, Bld: 211 mg/dL — ABNORMAL HIGH (ref 70–99)
Glucose, Bld: 246 mg/dL — ABNORMAL HIGH (ref 70–99)
Glucose, Bld: 261 mg/dL — ABNORMAL HIGH (ref 70–99)
Potassium: 5 mmol/L (ref 3.5–5.1)
Potassium: 5.5 mmol/L — ABNORMAL HIGH (ref 3.5–5.1)
Potassium: 5.6 mmol/L — ABNORMAL HIGH (ref 3.5–5.1)
Sodium: 138 mmol/L (ref 135–145)
Sodium: 140 mmol/L (ref 135–145)
Sodium: 141 mmol/L (ref 135–145)

## 2024-05-23 LAB — CBC
HCT: 38.6 % — ABNORMAL LOW (ref 39.0–52.0)
Hemoglobin: 11.6 g/dL — ABNORMAL LOW (ref 13.0–17.0)
MCH: 27 pg (ref 26.0–34.0)
MCHC: 30.1 g/dL (ref 30.0–36.0)
MCV: 90 fL (ref 80.0–100.0)
Platelets: 149 K/uL — ABNORMAL LOW (ref 150–400)
RBC: 4.29 MIL/uL (ref 4.22–5.81)
RDW: 15.8 % — ABNORMAL HIGH (ref 11.5–15.5)
WBC: 11.7 K/uL — ABNORMAL HIGH (ref 4.0–10.5)
nRBC: 0 % (ref 0.0–0.2)

## 2024-05-23 LAB — GLUCOSE, CAPILLARY
Glucose-Capillary: 208 mg/dL — ABNORMAL HIGH (ref 70–99)
Glucose-Capillary: 203 mg/dL — ABNORMAL HIGH (ref 70–99)
Glucose-Capillary: 173 mg/dL — ABNORMAL HIGH (ref 70–99)
Glucose-Capillary: 185 mg/dL — ABNORMAL HIGH (ref 70–99)
Glucose-Capillary: 243 mg/dL — ABNORMAL HIGH (ref 70–99)
Glucose-Capillary: 222 mg/dL — ABNORMAL HIGH (ref 70–99)

## 2024-05-23 LAB — POTASSIUM
Potassium: 5 mmol/L (ref 3.5–5.1)
Potassium: 5.8 mmol/L — ABNORMAL HIGH (ref 3.5–5.1)
Potassium: 6 mmol/L — ABNORMAL HIGH (ref 3.5–5.1)

## 2024-05-23 MED ORDER — SODIUM BICARBONATE 8.4 % IV SOLN
50.0000 meq | Freq: Once | INTRAVENOUS | Status: AC
  Administered 2024-05-23: 50 meq via INTRAVENOUS
  Filled 2024-05-23: qty 50

## 2024-05-23 MED ORDER — SODIUM ZIRCONIUM CYCLOSILICATE 10 G PO PACK
10.0000 g | PACK | Freq: Once | ORAL | Status: AC
  Administered 2024-05-23: 10 g via ORAL
  Filled 2024-05-23: qty 1

## 2024-05-23 MED ORDER — FUROSEMIDE 10 MG/ML IJ SOLN
40.0000 mg | Freq: Every day | INTRAMUSCULAR | Status: DC
  Administered 2024-05-23 – 2024-05-27 (×5): 40 mg via INTRAVENOUS
  Filled 2024-05-23 (×5): qty 4

## 2024-05-23 MED ORDER — DEXTROSE 50 % IV SOLN
1.0000 | Freq: Once | INTRAVENOUS | Status: AC
  Administered 2024-05-23: 50 mL via INTRAVENOUS
  Filled 2024-05-23: qty 50

## 2024-05-23 MED ORDER — INSULIN GLARGINE 100 UNIT/ML ~~LOC~~ SOLN
25.0000 [IU] | Freq: Two times a day (BID) | SUBCUTANEOUS | Status: DC
  Administered 2024-05-23 – 2024-05-25 (×5): 25 [IU] via SUBCUTANEOUS
  Filled 2024-05-23 (×7): qty 0.25

## 2024-05-23 MED ORDER — INSULIN ASPART 100 UNIT/ML IV SOLN
10.0000 [IU] | Freq: Once | INTRAVENOUS | Status: AC
  Administered 2024-05-23: 10 [IU] via INTRAVENOUS

## 2024-05-23 MED ORDER — SODIUM ZIRCONIUM CYCLOSILICATE 10 G PO PACK
10.0000 g | PACK | Freq: Every day | ORAL | Status: DC
  Administered 2024-05-24 – 2024-05-28 (×5): 10 g
  Filled 2024-05-23 (×5): qty 1

## 2024-05-23 MED ORDER — PROSOURCE TF20 ENFIT COMPATIBL EN LIQD
60.0000 mL | Freq: Two times a day (BID) | ENTERAL | Status: DC
  Administered 2024-05-23 – 2024-05-30 (×14): 60 mL
  Filled 2024-05-23 (×16): qty 60

## 2024-05-23 NOTE — Progress Notes (Signed)
 eLink Physician-Brief Progress Note Patient Name: Clarence Maldonado DOB: [birth year] MRN: [id number]   Date of Service  05/23/2024  HPI/Events of Note  Notified of hyperkalemia of 5.6  eICU Interventions  Hyperkalemia protocol - with insulin +dextrose , lokelma , albuterol  - ordered.  Maintain on continuous cardiac monitoring.  Will continue to follow serial labs.         Jovee Dettinger M DELA CRUZ 05/23/2024, 4:46 AM

## 2024-05-23 NOTE — Inpatient Diabetes Management (Signed)
 Inpatient Diabetes Program Recommendations  AACE/ADA: New Consensus Statement on Inpatient Glycemic Control   Target Ranges:  Prepandial:   less than 140 mg/dL      Peak postprandial:   less than 180 mg/dL (1-2 hours)      Critically ill patients:  140 - 180 mg/dL    Latest Reference Range & Units 05/22/24 07:22 05/22/24 11:25 05/22/24 15:12 05/22/24 19:48 05/22/24 23:39 05/23/24 03:26 05/23/24 06:01 05/23/24 07:21  Glucose-Capillary 70 - 99 mg/dL 757 (H) 743 (H) 761 (H) 266 (H) 261 (H) 208 (H) 203 (H) 173 (H)   Review of Glycemic Control  Diabetes history: DM2 Outpatient Diabetes medications: Jardiance 10 mg daily Current orders for Inpatient glycemic control: Lantus  20 units BID, Novolog  8 units Q4H, Novolog  0-15 units Q4H; Solumedrol 40 mg daily  Inpatient Diabetes Program Recommendations:    Insulin : If steroids are continued as ordered, please consider increasing Lantus  to 25 units BID.  Thanks, Earnie Gainer, RN, MSN, CDCES Diabetes Coordinator Inpatient Diabetes Program [phone] (Team Pager from 8am to 5pm)

## 2024-05-23 NOTE — Progress Notes (Signed)
 Nutrition Follow-up  DOCUMENTATION CODES:   Severe malnutrition in context of chronic illness  INTERVENTION:  TF via Cortrak: Osmolite 1.2 at 55 ml/h (1320 ml per day) Prosource TF20 60 ml BID Provides 1744 kcal, 113 gm protein, 1082 ml free water  daily   free water  flush q6h ( total) Total free water  provided (TF +FWF)-    Continue nutrisource fiber BID to aid in bulking of loose stools  NUTRITION DIAGNOSIS:  Severe Malnutrition related to chronic illness (COPD) as evidenced by severe muscle depletion, severe fat depletion. - remains applicable  GOAL:  Patient will meet greater than or equal to 90% of their needs - met via TF  MONITOR:  Vent status, Labs, Weight trends, TF tolerance  REASON FOR ASSESSMENT:  Ventilator, Consult Assessment of nutrition requirement/status  ASSESSMENT:  Pt admitted from rehab with worsening shortness of breath, found to have severe respiratory distress and unresponsive. PMH significant for COPD, CKD 3b, T2DM, HTN, HLD, right AKA, anxiety/depression.  8/8: admitted with COPD exacerbation, intubated 8/11: Cortrak placement (tip in the duodenum) 8/15: s/p cystoscopy and I&D of perineal abscess  Remains intubated on vent support. Unable to wean d/t mental status. No family identified for goals of care discussion.   Receiving lasix  for fluid overload and lokelma  daily for hyperkalemia.  DM coordinator following for insulin  recommendations d/t ongoing hyperglycemia secondary to steroids.   Plan to increase protein administration goal to account for wound healing secondary to recent I&D.   Admit weight: 61.9 kg Current weight: 65.4 kg (+ generalized, BUE and perineal edema)  UOP: x24 hours  Medications: lasix  40mg  daily, SSI 0-15 units q4h, novolog  8 units q4h, lantus  20 units BID, solu-medrol  Drips: Abx Propofol  @ 9.7ml/hr  Labs:  Potassium 5.0, 5.6 BUN 88 Cr 2.45 GFR 26 CBG's 173-266 x24 hours  Diet  Order:   Diet Order             Diet NPO time specified  Diet effective now                   EDUCATION NEEDS:   No education needs have been identified at this time  Skin:  Skin Assessment: Reviewed RN Assessment  Last BM:  x24 hours via FMS  Height:   Ht Readings from Last 1 Encounters:  05/21/24 (P) 5' 5 (1.651 m)    Weight:   Wt Readings from Last 1 Encounters:  05/23/24 65.4 kg   BMI:  Body mass index is 23.99 kg/m (pended).  Estimated Nutritional Needs:   Kcal:  1600-1800  Protein:  95-110g  Fluid:  >/=1.6L  Allie Deavin Forst, RDN, LDN Clinical Nutrition See AMiON for contact information.

## 2024-05-23 NOTE — Progress Notes (Signed)
 Patient perineum dressing and incision soiled. Incision cleansed and carefully packed using finger with betadine moistened kerlix, topped with ABD pad and secured with mesh underwear.

## 2024-05-23 NOTE — Progress Notes (Signed)
   4 Days Post-Op Subjective: First time meeting Clarence Maldonado.  He remains in critical condition and ventilator dependent.  Ongoing conversation of goals of care as patient remains on ventilator for his 11th day.  Objective: Vital signs in last 24 hours: Temp:  [97.6 F (36.4 C)-98.3 F (36.8 C)] 98 F (36.7 C) (08/19 1107) Pulse Rate:  [71-90] 84 (08/19 1130) Resp:  [22-29] 28 (08/19 1130) BP: (81-164)/(45-78) 81/50 (08/19 1249) SpO2:  [99 %-100 %] 100 % (08/19 1130) FiO2 (%):  [40 %] 40 % (08/19 1249) Weight:  [65.4 kg] 65.4 kg (08/19 0500)  Assessment/Plan: POD #4  #Perineal abscess  I&D with Dr. Selma on 05/19/2024.  Open scrotal wound with very deep track approximately 6+ inches towards pubis.  Completed wet-to-dry dressing with nursing.  ICU nursing to continue doing this through discharge to the floor, once every 24 hours.  Notify urology of purulent drainage, malodor, or significant new onset bleeding.  Urology will check on him intermittently.  Intake/Output from previous day: 08/18 0701 - 08/19 0700 In: 3508.3 [I.V.:193.2; NG/GT:2460; IV Piggyback:855.2] Out: 2075 [Urine:1875; Stool:200]  Intake/Output this shift: Total I/O In: 553.1 [I.V.:28.6; NG/GT:475; IV Piggyback:49.5] Out: 260 [Urine:260]  Physical Exam:  General: Intubated/sedated CV: No cyanosis Lungs: Ventilator dependent Abdomen: Soft, NTND, no rebound or guarding Gu: 32F Foley catheter in place draining clear yellow urine.  Lab Results: Recent Labs    05/21/24 1204 05/21/24 1507 05/22/24 0221  HGB 12.9* 12.2* 11.3*  HCT 38.0* 36.0* 39.1   BMET Recent Labs    05/21/24 0313 05/21/24 1204 05/21/24 1507 05/22/24 0221 05/23/24 0319 05/23/24 0701 05/23/24 0957  NA 145   < > 140 138 140 141  --   K 5.2*   < > 5.7* 5.6* 5.6* 5.0 5.0  CL 109  --   --  103 103 102  --   CO2 27  --   --  24 25 27   --   GLUCOSE 234*  --   --  419* 246* 211*  --   BUN 73*  --   --  78* 90* 88*  --    CREATININE 2.40*  --   --  2.40* 2.46* 2.45*  --   CALCIUM  8.1*  --   --  8.0* 8.1* 8.2*  --   HGB 12.1*   < > 12.2* 11.3*  --   --   --   WBC 15.3*  --   --  13.1*  --   --   --    < > = values in this interval not displayed.     Studies/Results: No results found.    LOS: 11 days   Ole Bourdon, NP [facility] Pager: [phone]  05/23/2024, 2:07 PM

## 2024-05-23 NOTE — Progress Notes (Addendum)
 NAME:  Clarence Maldonado, MRN:  [id number], DOB:  19-Aug-1946, LOS: 11 ADMISSION DATE:  05/12/2024, CONSULTATION DATE:  05/12/2024 REFERRING MD:  Cottie - EDP, CHIEF COMPLAINT:  Acute respiratory failure  History of Present Illness:  78 year old man who presented to [facility] 8/8 for acute respiratory failure. PMHx significant for HTN, HLD, COPD, CKD 3b, DMT2, BPH, R AKA, anxiety/depression.  Patient resides at rehab facility. Having SOB on 8/8 and prescribed rocephin /azithro. Worsening SOB. EMS called found in severe resp distress unresponsive. LMA placed and being bagged en route to [facility] ED. Wheezing b/l. Patient intubated on arrival. BP became soft post intubation requiring Levo. VBG 7.22/64/41/26. Labs pending. Given nebs, IV steroids, Mg. PCCM consulted for ICU admission.  Prior Echo in 2021 with EF of 30 to 35%. History of COPD. On triple therapy. No recent hospitalization for COPD.  Pertinent  Medical History   Past Medical History:  Diagnosis Date   Abnormal CT of the chest 2009   nodule 9 x 6 mm RUL   Benign localized hyperplasia of prostate with urinary obstruction and other lower urinary tract symptoms (LUTS)(600.21)    Cardiomegaly    Cerebrovascular accident (stroke) (HCC) 2009   lacunar infarct in the left thalamus   Cholelithiasis    Chronic airway obstruction, not elsewhere classified    Chronic kidney disease, stage III (moderate) (HCC)    Closed femur fracture (HCC) 1960   Contact with or exposure to viral disease    suspected    Illiterate    Obesity, unspecified    Other and unspecified hyperlipidemia    Special screening examination for respiratory tuberculosis    Tobacco use disorder    Tuberculosis    Type II or unspecified type diabetes mellitus with renal manifestations, uncontrolled(250.42)    Unspecified essential hypertension    Significant Hospital Events: Including procedures, antibiotic start and stop dates in addition to other pertinent events   8/8 - Admit  w/ COPD, HMPV infection, intubated. 8/13 - Weaning on vent, PSV 10/5, mild tachypnea. Remains on Precedex . 8/15 Developed perineal abscess.  Underwent cystoscopy and I&D with purulent drainage, wound packed 8/16 Failing PSV, on low dose levoped  Interim History / Subjective:   Off pressors today morning.  Remains on the ventilator Got 2 doses of Lokelma  for hyperkalemia  Objective:   Blood pressure (!) 103/54, pulse 84, temperature 97.6 F (36.4 C), temperature source Axillary, resp. rate (!) 28, height (P) 5' 5 (1.651 m), weight 65.4 kg, SpO2 100%.    Vent Mode: PRVC FiO2 (%):  [40 %] 40 % Set Rate:  [28 bmp] 28 bmp Vt Set:  [490 mL] 490 mL PEEP:  [5 cmH20] 5 cmH20 Plateau Pressure:  [18 cmH20-19 cmH20] 18 cmH20   Intake/Output Summary (Last 24 hours) at 05/23/2024 0751 Last data filed at 05/23/2024 0600 Gross per 24 hour  Intake 3508.34 ml  Output 2075 ml  Net 1433.34 ml   Filed Weights   05/19/24 0452 05/20/24 0457 05/23/24 0500  Weight: 61.7 kg 60.4 kg 65.4 kg   Physical Examination: Gen:      No acute distress HEENT:  EOMI, sclera anicteric, ETT Neck:     No masses; no thyromegaly Lungs:    Clear to auscultation bilaterally; normal respiratory effort CV:         Regular rate and rhythm; no murmurs Abd:      + bowel sounds; soft, non-tender; no palpable masses, no distension Ext: Right AKA, scrotal wound dressing Neuro:  Sedated  Lab/imaging reviewed Significant for potassium 5.6, creatinine 2.46 No new imaging  Resolved Problem List:  Likely sedation induced hypotension  Assessment and Plan:   Acute respiratory failure w/ hypercapnia COPD exacerbation Viral pneumonia PsA on Resp Cx RVP metapneumovirus positive; PCT elevated 0.8 possible bacterial component with Resp Cx +PsA. Continue vent support, pressure support weans as tolerated On Brovana , Pulmicort , Yupelri  nebs On Solu-Medrol  40 mg since 8/17 due to difficulty breathing with wheezing  heard Follow intermittent chest x-ray  Acute encephalopathy, likely metabolic and also sedating medications Reassuring Head CT; likely hypercapnia/sepsis related. Mild hyperammonemia - Limit sedating medications as able - Correct electrolyte derangements - Avoids interruptions to normal sleep/wake cycle as able  Perineal abscess with scrotal erythema/edema CT Pelvis 8/14 with 9.4 x 3.5 x 6.8cm fluid collection in inferior R perineum extending to the level of the testicles c/f abscess with diffuse scrotal wall edema and occlusion of the visualized R SFA. - POD#1 from I&D, cystoscopy.  Appreciate urology management Continue broad antibiotic coverage with Zosyn , linezolid .  Follow cultures Added micafungin  on 8/17 for yeast on sputum culture Wound care  Sepsis, likely 2/2 to pneumonia, perineal abscess and possible UTI w/ large leukocytes BCx 8/8 with 1/4 w/ staph hominis; RVP positive for metapneumovirus, Resp Cx 8/11 +PsA. Wean pressors.  Continue antibiotics and follow cultures as above.  AKI on CKD 3b, likely in the setting of septic ATN Anion gap metabolic acidosis, slightly elevated lactate Hypernatremia, hyperkalemia Follow urine output and creatinine Start lasix  40mg  per day to help with fluid overload. Watch Cr Lokelma  daily Continue free water   DMT2 Amaryl  and Farxiga  are both on hold. SSI, Lantus , Adjust as needed  HTN: Hold home antihypertensives in the setting of soft BP Cardiac monitoring  HLD Continue statin  BPH Resume Flomax  as able  Anxiety/depression  Resume home Wellbutrin  as able  Severe malnutrition, POA Tube feeds per nutrition consult.  Best Practice (right click and Reselect all SmartList Selections daily)   Diet/type: tubefeeds DVT prophylaxis prophylactic heparin   Pressure ulcer(s): N/A GI prophylaxis: H2B Lines: N/A Foley:  Yes, and it is still needed Code Status:  full code Last date of multidisciplinary goals of care discussion  [8/14 friend Norleen updated over phone]  Critical care time:    The patient is critically ill with multiple organ system failure and requires high complexity decision making for assessment and support, frequent evaluation and titration of therapies, advanced monitoring, review of radiographic studies and interpretation of complex data.   Critical Care Time devoted to patient care services, exclusive of separately billable procedures, described in this note is 35 minutes.   Zian Delair MD [place] [facility] See Amion for pager  If no response to pager , please call [phone] until 7pm After 7:00 pm call Elink  [phone] 05/23/2024, 7:51 AM

## 2024-05-23 NOTE — Plan of Care (Signed)
  Problem: Respiratory: Goal: Ability to maintain a clear airway and adequate ventilation will improve Outcome: Progressing   Problem: Clinical Measurements: Goal: Ability to maintain clinical measurements within normal limits will improve Outcome: Progressing   Problem: Clinical Measurements: Goal: Will remain free from infection Outcome: Progressing   Problem: Clinical Measurements: Goal: Diagnostic test results will improve Outcome: Progressing   Problem: Elimination: Goal: Will not experience complications related to bowel motility Outcome: Progressing   Problem: Pain Managment: Goal: General experience of comfort will improve and/or be controlled Outcome: Progressing   Problem: Safety: Goal: Ability to remain free from injury will improve Outcome: Progressing

## 2024-05-24 DIAGNOSIS — G9340 Encephalopathy, unspecified: Secondary | ICD-10-CM | POA: Diagnosis not present

## 2024-05-24 DIAGNOSIS — B9689 Other specified bacterial agents as the cause of diseases classified elsewhere: Secondary | ICD-10-CM

## 2024-05-24 DIAGNOSIS — J9602 Acute respiratory failure with hypercapnia: Secondary | ICD-10-CM | POA: Diagnosis not present

## 2024-05-24 DIAGNOSIS — J123 Human metapneumovirus pneumonia: Secondary | ICD-10-CM

## 2024-05-24 DIAGNOSIS — R7881 Bacteremia: Secondary | ICD-10-CM

## 2024-05-24 DIAGNOSIS — J441 Chronic obstructive pulmonary disease with (acute) exacerbation: Secondary | ICD-10-CM | POA: Diagnosis not present

## 2024-05-24 LAB — POTASSIUM
Potassium: 5 mmol/L (ref 3.5–5.1)
Potassium: 5 mmol/L (ref 3.5–5.1)
Potassium: 4.7 mmol/L (ref 3.5–5.1)
Potassium: 5.1 mmol/L (ref 3.5–5.1)
Potassium: 5.1 mmol/L (ref 3.5–5.1)

## 2024-05-24 LAB — AEROBIC/ANAEROBIC CULTURE W GRAM STAIN (SURGICAL/DEEP WOUND): Gram Stain: NONE SEEN

## 2024-05-24 LAB — GLUCOSE, CAPILLARY
Glucose-Capillary: 105 mg/dL — ABNORMAL HIGH (ref 70–99)
Glucose-Capillary: 125 mg/dL — ABNORMAL HIGH (ref 70–99)
Glucose-Capillary: 131 mg/dL — ABNORMAL HIGH (ref 70–99)
Glucose-Capillary: 150 mg/dL — ABNORMAL HIGH (ref 70–99)
Glucose-Capillary: 166 mg/dL — ABNORMAL HIGH (ref 70–99)
Glucose-Capillary: 176 mg/dL — ABNORMAL HIGH (ref 70–99)

## 2024-05-24 MED ORDER — FREE WATER
100.0000 mL | Freq: Four times a day (QID) | Status: DC
  Administered 2024-05-24 – 2024-05-27 (×13): 100 mL

## 2024-05-24 MED ORDER — CHLORHEXIDINE GLUCONATE CLOTH 2 % EX PADS
6.0000 | MEDICATED_PAD | CUTANEOUS | Status: DC
  Administered 2024-05-25 – 2024-06-09 (×15): 6 via TOPICAL

## 2024-05-24 MED ORDER — INSULIN ASPART 100 UNIT/ML IJ SOLN
0.0000 [IU] | INTRAMUSCULAR | Status: DC
  Administered 2024-05-24 – 2024-05-27 (×4): 3 [IU] via SUBCUTANEOUS
  Administered 2024-05-28: 5 [IU] via SUBCUTANEOUS

## 2024-05-24 NOTE — TOC Progression Note (Addendum)
 Transition of Care [facility]) - Progression Note    Patient Details  Name: Clarence Maldonado MRN: [id number] Date of Birth: May 18, 1946  [facility] CM/SW Contact  Lendia Dais, [place] Phone Number: 05/24/2024, 8:12 AM  Clinical Narrative:  Pt is intubated and CSW is unable to assess.  CSW spoke to contact [name] (friend) [phone]. CSW asked Norleen if he was the pt's friend or legal guardian. [name] stated that he is just a friend to the pt and helps out when needed. [name] mentioned that the patient does not live with him but in a RV.   CSW inquired if Norleen was the POA and Norleen replied I believe so CSW asked if Norleen could bring the documentation of the POA so we can put it in the pt's medical chart and Norleen was agreeable.  ICM will continue to follow.    Expected Discharge Plan: Long Term Nursing Home (From [facility]) Barriers to Discharge: Continued Medical Work up               Expected Discharge Plan and Services In-house Referral: Clinical Social Work     Living arrangements for the past 2 months: Skilled Nursing Facility                                       Social Drivers of Health (SDOH) Interventions SDOH Screenings   Food Insecurity: No Food Insecurity (10/20/2023)  Housing: Unknown (10/20/2023)  Transportation Needs: No Transportation Needs (10/20/2023)  Utilities: Not At Risk (10/20/2023)  Depression (PHQ2-9): Low Risk  (10/14/2023)  Financial Resource Strain: Low Risk  (03/23/2019)  Physical Activity: Inactive (03/23/2019)  Social Connections: Socially Isolated (03/23/2019)  Stress: No Stress Concern Present (03/23/2019)  Tobacco Use: Medium Risk (05/19/2024)    Readmission Risk Interventions     No data to display

## 2024-05-24 NOTE — Progress Notes (Signed)
 Pt placed back on previous full vent support setting to rest for the night. Pt tolerating well

## 2024-05-24 NOTE — Progress Notes (Addendum)
 NAME:  Clarence Maldonado, MRN:  [id number], DOB:  11/20/1945, LOS: 12 ADMISSION DATE:  05/12/2024, CONSULTATION DATE:  05/12/2024 REFERRING MD:  Clarence Maldonado - EDP, CHIEF COMPLAINT:  Acute respiratory failure  History of Present Illness:  78 year old man who presented to [facility] 8/8 for acute respiratory failure. PMHx significant for HTN, HLD, COPD, CKD 3b, DMT2, BPH, R AKA, anxiety/depression.  Patient resides at rehab facility. Having SOB on 8/8 and prescribed rocephin /azithro. Worsening SOB. EMS called found in severe resp distress unresponsive. LMA placed and being bagged en route to [facility] ED. Wheezing b/l. Patient intubated on arrival. BP became soft post intubation requiring Levo. VBG 7.22/64/41/26. Labs pending. Given nebs, IV steroids, Mg. PCCM consulted for ICU admission.  Prior Echo in 2021 with EF of 30 to 35%. History of COPD. On triple therapy. No recent hospitalization for COPD.  Pertinent  Medical History   Past Medical History:  Diagnosis Date   Abnormal CT of the chest 2009   nodule 9 x 6 mm RUL   Benign localized hyperplasia of prostate with urinary obstruction and other lower urinary tract symptoms (LUTS)(600.21)    Cardiomegaly    Cerebrovascular accident (stroke) (HCC) 2009   lacunar infarct in the left thalamus   Cholelithiasis    Chronic airway obstruction, not elsewhere classified    Chronic kidney disease, stage III (moderate) (HCC)    Closed femur fracture (HCC) 1960   Contact with or exposure to viral disease    suspected    Illiterate    Obesity, unspecified    Other and unspecified hyperlipidemia    Special screening examination for respiratory tuberculosis    Tobacco use disorder    Tuberculosis    Type II or unspecified type diabetes mellitus with renal manifestations, uncontrolled(250.42)    Unspecified essential hypertension    Significant Hospital Events: Including procedures, antibiotic start and stop dates in addition to other pertinent events   8/8 - Admit  w/ COPD, HMPV infection, intubated. 8/13 - Weaning on vent, PSV 10/5, mild tachypnea. Remains on Precedex . 8/15 Developed perineal abscess.  Underwent cystoscopy and I&D with purulent drainage, wound packed 8/16 Failing PSV, on low dose levoped 8/19: failed SBT, off vasopressors. Treated for hyperkalemia  Interim History / Subjective:  + 539 ml I/O, net + 12 L this admission Recently treated for hyperkalemia, K remains stable at 5  Objective:   Blood pressure (!) 146/69, pulse 77, temperature 97.6 F (36.4 C), temperature source Axillary, resp. rate (!) 28, height 5' 5 (1.651 m), weight 65.4 kg, SpO2 98%.    Vent Mode: PRVC FiO2 (%):  [40 %] 40 % Set Rate:  [28 bmp] 28 bmp Vt Set:  [490 mL] 490 mL PEEP:  [5 cmH20] 5 cmH20 Pressure Support:  [10 cmH20] 10 cmH20 Plateau Pressure:  [16 cmH20-18 cmH20] 18 cmH20   Intake/Output Summary (Last 24 hours) at 05/24/2024 0957 Last data filed at 05/24/2024 0634 Gross per 24 hour  Intake 2565.99 ml  Output 1975 ml  Net 590.99 ml   Filed Weights   05/19/24 0452 05/20/24 0457 05/23/24 0500  Weight: 61.7 kg 60.4 kg 65.4 kg   Physical Examination: Gen:      No acute distress HEENT:  EOMI, sclera anicteric, ETT Neck:     No masses; no thyromegaly Lungs:    Clear to auscultation bilaterally; normal respiratory effort CV:         Regular rate and rhythm; no murmurs Abd:      +  bowel sounds; soft, non-tender; no palpable masses, no distension Ext: Right AKA, scrotal wound dressing Neuro: Sedated, follows commands on left hand and left foot. (R AKA)   Resolved Problem List:  Likely sedation induced hypotension  Assessment and Plan:   Acute respiratory failure w/ hypercapnia COPD exacerbation Viral pneumonia, + metapneumovirus PsA on Resp Cx 8/11 Intubated 8/8, difficulty weaning  -Cont Brovana , Pulmicort , Yupelri  nebs -On Solu-Medrol  40 mg since 8/17 due to ongoing wheezing with difficulty weaning from vent -Continue daily  SAT/SBT.  Currently tolerating PSV 15/5 this am. Vent day 12.   Acute encephalopathy, likely metabolic and also sedating medications Reassuring Head CT; likely hypercapnia/sepsis related. Mild hyperammonemia - Limit sedating medications as able, intermittently following commands this morning   Perineal abscess with scrotal erythema/edema CT Pelvis 8/14 with 9.4 x 3.5 x 6.8cm fluid collection in inferior R perineum extending to the level of the testicles c/f abscess with diffuse scrotal wall edema and occlusion of the visualized R SFA. - 8/15 patient underwent I&D, cystoscopy.   - Wound care per Urology  Sepsis Staph hominis bacteremia 8/8 Perineal abscess likely 2/2 to pneumonia, perineal abscess and possible UTI w/ large leukocytes - Levo off since 8/17 - Continue broad antibiotic coverage with Zosyn , linezolid , micafungin  - end dates in place  AKI on CKD 3b, likely in the setting of septic ATN Anion gap metabolic acidosis, slightly elevated lactate Hypernatremia, hyperkalemia Baseline creatinine 1.4-1.8 Continue lasix  40mg  IV daily Lokelma  daily Continue free water , 100 ml q 6 hours  DMT2 Amaryl  and Farxiga  are both on hold. SSI + mealtime insulin  + Lantus , Adjust as needed  HTN: Hold home antihypertensives in the setting of soft BP Cardiac monitoring  HLD Continue statin  BPH Resume Flomax  as able  Anxiety/depression  Resume home Wellbutrin  as able  Severe malnutrition, POA Tube feeds per nutrition consult.  Best Practice (right click and Reselect all SmartList Selections daily)   Diet/type: tubefeeds DVT prophylaxis prophylactic heparin   Pressure ulcer(s): N/A GI prophylaxis: H2B Lines: N/A Foley:  Yes, and it is still needed Code Status:  full code Last date of multidisciplinary goals of care discussion [8/20 friend Clarence Maldonado updated over phone] He is coming in this afternoon for further discussions.  He does not have HCPOA paperwork. He did mention that  patient does have a daughter that is not involved in the patient's life- no contact information available for her.   Addendum: Daughter, Clarence Maldonado [phone] - attempted to call and voicemail left. Per social work she is interested in assisting with decision making.  Critical care time:    The patient is critically ill with multiple organ system failure and requires high complexity decision making for assessment and support, frequent evaluation and titration of therapies, advanced monitoring, review of radiographic studies and interpretation of complex data.   Critical Care Time devoted to patient care services, exclusive of separately billable procedures, described in this note is 35 minutes.   Lucie Irving  [facility] See Amion for pager  If no response to pager , please call [phone] until 7pm After 7:00 pm call Elink  [phone] 05/24/2024, 9:57 AM

## 2024-05-24 NOTE — TOC Progression Note (Signed)
 Transition of Care [facility]) - Progression Note    Patient Details  Name: Clarence Maldonado MRN: [id number] Date of Birth: [birth year]  Transition of Care [name] CM/SW Contact  Lendia Dais, [place] Phone Number: 05/24/2024, 12:40 PM  Clinical Narrative: Pt remains intubated.  CSW spoke to friend Clarence Maldonado about family contacts and POA. [name] does not have access to the POA documents and mentioned that the patient has a daughter Clarence Maldonado and that they weren't close. [name] provided a phone number for Clarence Maldonado [phone]. [name] stated he would be in the hospital around 1-2pm.  CSW spoke to [place] and explained that the patient is intubated in the hospital and does not have the capacity to make decisions at the moment. Clarence Maldonado verbalized that she had not talked or seen her dad in years and that they have a strained relationship. Clarence Maldonado expressed some discomfort about making decisions for the pt due to not seeing him in so long.  CSW asked Clarence Maldonado if she was familiar with the pt's friend Clarence Maldonado and Clarence Maldonado agreed. CSW mentioned that they could reach out to [facility] for help in making decisions and providing support. Clarence Maldonado was agreeable and expressed concerns about what will happen next. CSW offered support to [place] and assuring her that ICM is here as a layer of support. Clarence Maldonado expressed gratitude.   ICM will continue to follow.    Expected Discharge Plan: Long Term Nursing Home (From [facility]) Barriers to Discharge: Continued Medical Work up               Expected Discharge Plan and Services In-house Referral: Clinical Social Work     Living arrangements for the past 2 months: Skilled Nursing Facility                                       Social Drivers of Health (SDOH) Interventions SDOH Screenings   Food Insecurity: No Food Insecurity (10/20/2023)  Housing: Unknown (10/20/2023)  Transportation Needs: No Transportation Needs (10/20/2023)  Utilities: Not At Risk (10/20/2023)   Depression (PHQ2-9): Low Risk  (10/14/2023)  Financial Resource Strain: Low Risk  (03/23/2019)  Physical Activity: Inactive (03/23/2019)  Social Connections: Socially Isolated (03/23/2019)  Stress: No Stress Concern Present (03/23/2019)  Tobacco Use: Medium Risk (05/19/2024)    Readmission Risk Interventions     No data to display

## 2024-05-24 NOTE — Progress Notes (Signed)
   5 Days Post-Op Subjective: Patient is awake and progressing.  Undergoing SBT on rounds with expectation of extubation later this morning.  Reviewed case and plan with critical care team and nursing at bedside.  Objective: Vital signs in last 24 hours: Temp:  [97 F (36.1 C)-98 F (36.7 C)] 98 F (36.7 C) (08/20 1108) Pulse Rate:  [66-97] 87 (08/20 1200) Resp:  [19-31] 22 (08/20 1200) BP: (88-146)/(45-85) 121/60 (08/20 1200) SpO2:  [93 %-100 %] 98 % (08/20 1200) FiO2 (%):  [40 %] 40 % (08/20 1021)  Assessment/Plan: POD #4  #Perineal abscess  I&D with Dr. Selma on 05/19/2024.  Open scrotal wound with very deep track approximately 6+ inches towards pubis.  Completed wet-to-dry dressing with nursing.  ICU nursing to continue doing this through discharge to the floor, once every 24 hours.  Notify urology of purulent drainage, malodor, or significant new onset bleeding.  Elected not to do wound care in the middle of SBT, as the discomfort would probably cause him to fail.  Can go back to every 24 hour p.m. dressing changes.  Urology will check on him intermittently.  Intake/Output from previous day: 08/19 0701 - 08/20 0700 In: 2844.5 [I.V.:194.1; NG/GT:1790; IV Piggyback:860.4] Out: 2235 [Urine:2085; Stool:150]  Intake/Output this shift: Total I/O In: 689.2 [I.V.:30.9; NG/GT:375; IV Piggyback:283.3] Out: 1225 [Urine:1225]  Physical Exam:  General: Intubated/sedated CV: No cyanosis Lungs: Ventilator dependent Abdomen: Soft, NTND, no rebound or guarding Gu: 42F Foley catheter in place draining clear yellow urine.  Lab Results: Recent Labs    05/21/24 1507 05/22/24 0221 05/23/24 2330  HGB 12.2* 11.3* 11.6*  HCT 36.0* 39.1 38.6*   BMET Recent Labs    05/22/24 0221 05/23/24 0319 05/23/24 0701 05/23/24 0957 05/23/24 2330 05/24/24 0646 05/24/24 0812 05/24/24 1216  NA 138   < > 141  --  138  --   --   --   K 5.6*   < > 5.0   < > 5.5*   < > 5.0 4.7  CL 103   < > 102   --  102  --   --   --   CO2 24   < > 27  --  25  --   --   --   GLUCOSE 419*   < > 211*  --  261*  --   --   --   BUN 78*   < > 88*  --  99*  --   --   --   CREATININE 2.40*   < > 2.45*  --  2.52*  --   --   --   CALCIUM  8.0*   < > 8.2*  --  7.9*  --   --   --   HGB 11.3*  --   --   --  11.6*  --   --   --   WBC 13.1*  --   --   --  11.7*  --   --   --    < > = values in this interval not displayed.     Studies/Results: No results found.    LOS: 12 days   Ole Bourdon, NP [facility] Pager: [phone]  05/24/2024, 2:38 PM

## 2024-05-25 DIAGNOSIS — J441 Chronic obstructive pulmonary disease with (acute) exacerbation: Secondary | ICD-10-CM | POA: Diagnosis not present

## 2024-05-25 DIAGNOSIS — J123 Human metapneumovirus pneumonia: Secondary | ICD-10-CM | POA: Diagnosis not present

## 2024-05-25 DIAGNOSIS — J9602 Acute respiratory failure with hypercapnia: Secondary | ICD-10-CM | POA: Diagnosis not present

## 2024-05-25 DIAGNOSIS — G9340 Encephalopathy, unspecified: Secondary | ICD-10-CM | POA: Diagnosis not present

## 2024-05-25 DIAGNOSIS — N492 Inflammatory disorders of scrotum: Secondary | ICD-10-CM

## 2024-05-25 LAB — TRIGLYCERIDES
Triglycerides: 61 mg/dL (ref <150)
Triglycerides: 61 mg/dL (ref <150)

## 2024-05-25 LAB — CBC
HCT: 35.1 % — ABNORMAL LOW (ref 39.0–52.0)
Hemoglobin: 10.8 g/dL — ABNORMAL LOW (ref 13.0–17.0)
MCH: 27.3 pg (ref 26.0–34.0)
MCHC: 30.8 g/dL (ref 30.0–36.0)
MCV: 88.9 fL (ref 80.0–100.0)
Platelets: 172 K/uL (ref 150–400)
RBC: 3.95 MIL/uL — ABNORMAL LOW (ref 4.22–5.81)
RDW: 15.6 % — ABNORMAL HIGH (ref 11.5–15.5)
WBC: 10.8 K/uL — ABNORMAL HIGH (ref 4.0–10.5)
nRBC: 0 % (ref 0.0–0.2)

## 2024-05-25 LAB — GLUCOSE, CAPILLARY
Glucose-Capillary: 257 mg/dL — ABNORMAL HIGH (ref 70–99)
Glucose-Capillary: 107 mg/dL — ABNORMAL HIGH (ref 70–99)
Glucose-Capillary: 111 mg/dL — ABNORMAL HIGH (ref 70–99)
Glucose-Capillary: 124 mg/dL — ABNORMAL HIGH (ref 70–99)
Glucose-Capillary: 121 mg/dL — ABNORMAL HIGH (ref 70–99)
Glucose-Capillary: 127 mg/dL — ABNORMAL HIGH (ref 70–99)

## 2024-05-25 LAB — BASIC METABOLIC PANEL WITH GFR
Anion gap: 10 (ref 5–15)
BUN: 108 mg/dL — ABNORMAL HIGH (ref 8–23)
CO2: 28 mmol/L (ref 22–32)
Calcium: 7.8 mg/dL — ABNORMAL LOW (ref 8.9–10.3)
Chloride: 99 mmol/L (ref 98–111)
Creatinine, Ser: 2.42 mg/dL — ABNORMAL HIGH (ref 0.61–1.24)
GFR, Estimated: 27 mL/min — ABNORMAL LOW (ref >60)
Glucose, Bld: 187 mg/dL — ABNORMAL HIGH (ref 70–99)
Potassium: 5 mmol/L (ref 3.5–5.1)
Sodium: 137 mmol/L (ref 135–145)

## 2024-05-25 MED ORDER — INSULIN ASPART 100 UNIT/ML IJ SOLN
4.0000 [IU] | INTRAMUSCULAR | Status: DC
  Administered 2024-05-27 (×2): 4 [IU] via SUBCUTANEOUS

## 2024-05-25 MED ORDER — FENTANYL CITRATE PF 50 MCG/ML IJ SOSY
25.0000 ug | PREFILLED_SYRINGE | INTRAMUSCULAR | Status: DC | PRN
  Administered 2024-05-25 – 2024-05-26 (×4): 50 ug via INTRAVENOUS
  Filled 2024-05-25 (×3): qty 1

## 2024-05-25 MED ORDER — METOPROLOL TARTRATE 12.5 MG HALF TABLET
12.5000 mg | ORAL_TABLET | Freq: Two times a day (BID) | ORAL | Status: DC
  Administered 2024-05-26 – 2024-05-31 (×12): 12.5 mg
  Filled 2024-05-25 (×12): qty 1

## 2024-05-25 MED ORDER — HYDRALAZINE HCL 20 MG/ML IJ SOLN
10.0000 mg | INTRAMUSCULAR | Status: AC | PRN
  Administered 2024-05-26 – 2024-05-28 (×2): 10 mg via INTRAVENOUS
  Filled 2024-05-25 (×2): qty 1

## 2024-05-25 MED ORDER — ORAL CARE MOUTH RINSE
15.0000 mL | OROMUCOSAL | Status: DC | PRN
Start: 2024-05-25 — End: 2024-06-09
  Administered 2024-06-02: 15 mL via OROMUCOSAL

## 2024-05-25 MED ORDER — ORAL CARE MOUTH RINSE
15.0000 mL | OROMUCOSAL | Status: DC
  Administered 2024-05-25 – 2024-06-09 (×57): 15 mL via OROMUCOSAL

## 2024-05-25 NOTE — Progress Notes (Signed)
 eLink Physician-Brief Progress Note Patient Name: Clarence Maldonado DOB: [birth year] MRN: [id number]   Date of Service  05/25/2024  HPI/Events of Note  BP 177/80, HR 92.  eICU Interventions  Home Metoprolol  12.5 mg via tube Q 12 hours re-ordered, PRN iv Hydralazine  ordered for break-through hypertension.        Donice Alperin U Quill Grinder 05/25/2024, 10:50 PM

## 2024-05-25 NOTE — Progress Notes (Signed)
 Nutrition Follow-up  DOCUMENTATION CODES:  Severe malnutrition in context of chronic illness  INTERVENTION:  TF via Cortrak: Osmolite 1.2 at 55 ml/h (1320 ml per day) Prosource TF20 60 ml BID Provides 1744 kcal, 113 gm protein, 1082 ml free water  daily   free water  flush q6h ( total) Total free water  provided (TF +FWF)-    Continue nutrisource fiber BID to aid in bulking of loose stools  NUTRITION DIAGNOSIS:  Severe Malnutrition related to chronic illness (COPD) as evidenced by severe muscle depletion, severe fat depletion. - remains applicable  GOAL:  Patient will meet greater than or equal to 90% of their needs - goal met via TF  MONITOR:   Vent status, Labs, Weight trends, TF tolerance  REASON FOR ASSESSMENT:  Ventilator, Consult Assessment of nutrition requirement/status  ASSESSMENT:  Pt admitted from rehab with worsening shortness of breath, found to have severe respiratory distress and unresponsive. PMH significant for COPD, CKD 3b, T2DM, HTN, HLD, right AKA, anxiety/depression.  8/8: admitted with COPD exacerbation, intubated 8/11: Cortrak placement (tip in the duodenum) 8/15: s/p cystoscopy and I&D of perineal abscess 8/21: extubated  Checked in with patient at bedside post extubation this morning.  He is only oriented to person and difficult to understand speech.  Pt coughing up thick sounding secretions at time of visit.   Tube feeding continues to infuse via Cortrak at goal rate.  No current changes to nutrition plan though will monitor for ability to resume oral diet.   Admit weight: 61.9 kg Current weight: 64.9 kg (+ mild pitting generalized, BUE; non-pitting perineal edema)  Medications: nutrisource fiber BID, lasix  40mg  daily, SSI 0-15 units q4h, novolog  4 units q4h, lantus  25 units BID  Labs:  BUN 108 Cr 2.42 CBG's 27 CBG's 107-176 x24 hours  Diet Order:   Diet Order             Diet NPO time specified  Diet effective now                    EDUCATION NEEDS:   No education needs have been identified at this time  Skin:  Skin Assessment: Reviewed RN Assessment  Last BM:  x24 hours via FMS  Height:   Ht Readings from Last 1 Encounters:  05/23/24 5' 5 (1.651 m)    Weight:   Wt Readings from Last 1 Encounters:  05/25/24 64.9 kg   BMI:  Body mass index is 23.81 kg/m.  Estimated Nutritional Needs:   Kcal:  1600-1800  Protein:  95-110g  Fluid:  >/=1.6L  Allie Grettel Rames, RDN, LDN Clinical Nutrition See AMiON for contact information.

## 2024-05-25 NOTE — Progress Notes (Addendum)
 NAME:  Clarence Maldonado, MRN:  [id number], DOB:  1946/02/13, LOS: 13 ADMISSION DATE:  05/12/2024, CONSULTATION DATE:  05/12/2024 REFERRING MD:  Cottie - EDP, CHIEF COMPLAINT:  Acute respiratory failure  History of Present Illness:  78 year old man who presented to [facility] 8/8 for acute respiratory failure. PMHx significant for HTN, HLD, COPD, CKD 3b, DMT2, BPH, R AKA, anxiety/depression.  Patient resides at rehab facility. Having SOB on 8/8 and prescribed rocephin /azithro. Worsening SOB. EMS called found in severe resp distress unresponsive. LMA placed and being bagged en route to [facility] ED. Wheezing b/l. Patient intubated on arrival. BP became soft post intubation requiring Levo. VBG 7.22/64/41/26. Labs pending. Given nebs, IV steroids, Mg. PCCM consulted for ICU admission.  Prior Echo in 2021 with EF of 30 to 35%. History of COPD. On triple therapy. No recent hospitalization for COPD.  Pertinent  Medical History   Past Medical History:  Diagnosis Date   Abnormal CT of the chest 2009   nodule 9 x 6 mm RUL   Benign localized hyperplasia of prostate with urinary obstruction and other lower urinary tract symptoms (LUTS)(600.21)    Cardiomegaly    Cerebrovascular accident (stroke) (HCC) 2009   lacunar infarct in the left thalamus   Cholelithiasis    Chronic airway obstruction, not elsewhere classified    Chronic kidney disease, stage III (moderate) (HCC)    Closed femur fracture (HCC) 1960   Contact with or exposure to viral disease    suspected    Illiterate    Obesity, unspecified    Other and unspecified hyperlipidemia    Special screening examination for respiratory tuberculosis    Tobacco use disorder    Tuberculosis    Type II or unspecified type diabetes mellitus with renal manifestations, uncontrolled(250.42)    Unspecified essential hypertension    Significant Hospital Events: Including procedures, antibiotic start and stop dates in addition to other pertinent events   8/8 - Admit  w/ COPD, HMPV infection, intubated. 8/13 - Weaning on vent, PSV 10/5, mild tachypnea. Remains on Precedex . 8/15 Developed perineal abscess.  Underwent cystoscopy and I&D with purulent drainage, wound packed 8/16 Failing PSV, on low dose levoped 8/19: failed SBT, off vasopressors. Treated for hyperkalemia 8/20: tol PS wean   Interim History / Subjective:  No sig change overnight. Tol PS wean yesterday. Unable to get return call from daughter to establish goals of care   Objective:   Blood pressure 111/60, pulse 74, temperature 97.6 F (36.4 C), temperature source Axillary, resp. rate (!) 21, height 5' 5 (1.651 m), weight 64.9 kg, SpO2 100%.    Vent Mode: PSV;CPAP FiO2 (%):  [40 %] 40 % Set Rate:  [28 bmp] 28 bmp Vt Set:  [490 mL] 490 mL PEEP:  [5 cmH20] 5 cmH20 Pressure Support:  [5 cmH20-10 cmH20] 10 cmH20 Plateau Pressure:  [18 cmH20-28 cmH20] 18 cmH20   Intake/Output Summary (Last 24 hours) at 05/25/2024 0753 Last data filed at 05/25/2024 0600 Gross per 24 hour  Intake 2668.76 ml  Output 3875 ml  Net -1206.24 ml   Filed Weights   05/20/24 0457 05/23/24 0500 05/25/24 0419  Weight: 60.4 kg 65.4 kg 64.9 kg   Physical Examination: General:  chronically ill appearing male, NAD on vent  HEENT: MM pink/moist, ETT, mm dry Neuro: RASS -1, opens eyes to voice, not following commands, MAE CV: s1s2 rrr, no m/r/g PULM:  resps even non labored on vent, few scattered rhonchi otherwise clear  GI: soft, bsx4 active  Extremities: warm/dry, no edema, R AKA Skin: no rashes or lesions   Resolved Problem List:  Likely sedation induced hypotension  Assessment and Plan:   Acute respiratory failure w/ hypercapnia COPD exacerbation Viral pneumonia, + metapneumovirus PsA on Resp Cx 8/11 Intubated 8/8, difficulty weaning  P: Vent support - 8cc/kg  F/u CXR  F/u ABG Continue Brovana , Pulmicort , Yupelri  nebs Continue Solu-Medrol  40 mg through 8/22 daily SAT/SBT. Tol PS . Vent day  13. - Hope to extubate today, however would like to establish goals of care with daughter prior if able. Will continue trying to reach her, but if he is tol wean may need to proceed with extubation   Acute encephalopathy, likely metabolic and also sedating medications Reassuring Head CT; likely hypercapnia/sepsis related. Mild hyperammonemia - Limit sedating medications as able, intermittently following commands this morning   Perineal abscess with scrotal erythema/edema CT Pelvis 8/14 with 9.4 x 3.5 x 6.8cm fluid collection in inferior R perineum extending to the level of the testicles c/f abscess with diffuse scrotal wall edema and occlusion of the visualized R SFA. - 8/15 patient underwent I&D, cystoscopy.   - Wound care per Urology  Sepsis Staph hominis bacteremia 8/8 Perineal abscess likely 2/2 to pneumonia, perineal abscess and possible UTI w/ large leukocytes - linezolid , zosyn  with stop dates in place - micafungin  for candida in sputum - stop date in place   AKI on CKD 3b, likely in the setting of septic ATN Anion gap metabolic acidosis, slightly elevated lactate Hypernatremia, hyperkalemia Remains >11L NET POS Baseline creatinine 1.4-1.8 P: Continue lasix  40mg  IV daily - monitor Lokelma  daily Continue free water , 100 ml q 6 hours F/u chem   DMT2 Amaryl  and Farxiga  are both on hold. SSI + mealtime insulin  + Lantus , Adjust as needed  HTN: Hold home antihypertensives in the setting of soft BP Cardiac monitoring  HLD Continue statin  BPH Resume Flomax  as able  Anxiety/depression  Resume home Wellbutrin  as able  Severe malnutrition, POA Tube feeds per nutrition consult.  Best Practice (right click and Reselect all SmartList Selections daily)   Diet/type: tubefeeds DVT prophylaxis prophylactic heparin   Pressure ulcer(s): N/A GI prophylaxis: H2B Lines: N/A Foley:  Yes, and it is still needed Code Status:  full code Last date of multidisciplinary goals of  care discussion [8/20 friend Norleen updated over phone] He does not have HCPOA paperwork. He did mention that patient does have a daughter that is not involved in the patient's life- no contact information available for her.  Daughter, [name] [phone] - initially made contact and was agreeable to assist with decision making per SW but unable to reach her 8/20, will try to reach her today.   Addendum - attempted to contact pt's daughter tracy. Called x3. Left VM.  Critical care time:    The patient is critically ill with multiple organ system failure and requires high complexity decision making for assessment and support, frequent evaluation and titration of therapies, advanced monitoring, review of radiographic studies and interpretation of complex data.   Critical Care Time devoted to patient care services, exclusive of separately billable procedures, described in this note is 32 minutes.   Rockie Myers, NP Pulmonary/Critical Care Medicine  05/25/2024  7:53 AM   See Tracey for personal pager PCCM on call pager [phone] until 7pm. Please call Elink 7p-7a. [phone]

## 2024-05-25 NOTE — Progress Notes (Signed)
   6 Days Post-Op Subjective: Successfully extubated yesterday. 4-5LNC. Tolerated dressing change with pre-medication.  Objective: Vital signs in last 24 hours: Temp:  [97.6 F (36.4 C)-99.3 F (37.4 C)] 97.6 F (36.4 C) (08/21 1102) Pulse Rate:  [69-91] 80 (08/21 1000) Resp:  [15-29] 21 (08/21 1000) BP: (87-149)/(52-102) 136/68 (08/21 1000) SpO2:  [87 %-100 %] 96 % (08/21 1000) FiO2 (%):  [40 %] 40 % (08/21 0821) Weight:  [64.9 kg] 64.9 kg (08/21 0419)  Assessment/Plan: POD #4  #Perineal abscess  I&D with Dr. Selma on 05/19/2024.  Open scrotal wound with very deep track approximately 6+ inches towards pubis.  Completed wet-to-dry dressing today with nursing.  ICU nursing to continue doing these p.m. every 24h and prn, through discharge to the floor. Notify urology of purulent drainage, malodor, or significant new onset bleeding.  Impossible to visulaize deep and narrow wound bed. Exterior is clean, dry, without malodor or purulent drainage.   Urologyfollow  Intake/Output from previous day: 08/20 0701 - 08/21 0700 In: 2668.8 [I.V.:139.1; WH/HU:8214; IV Piggyback:744.7] Out: 3875 [Urine:3425; Stool:450]  Intake/Output this shift: Total I/O In: 271.7 [I.V.:10.5; NG/GT:220; IV Piggyback:41.3] Out: 200 [Urine:200]  Physical Exam:  General: Intubated/sedated CV: No cyanosis Lungs: Ventilator dependent Abdomen: Soft, NTND, no rebound or guarding Gu: 76F Foley catheter in place draining clear yellow urine.  Lab Results: Recent Labs    05/23/24 2330 05/25/24 0008  HGB 11.6* 10.8*  HCT 38.6* 35.1*   BMET Recent Labs    05/23/24 2330 05/24/24 0646 05/24/24 2032 05/25/24 0008  NA 138  --   --  137  K 5.5*   < > 5.1 5.0  CL 102  --   --  99  CO2 25  --   --  28  GLUCOSE 261*  --   --  187*  BUN 99*  --   --  108*  CREATININE 2.52*  --   --  2.42*  CALCIUM  7.9*  --   --  7.8*  HGB 11.6*  --   --  10.8*  WBC 11.7*  --   --  10.8*   < > = values in this interval  not displayed.     Studies/Results: No results found.    LOS: 13 days   Clarence Bourdon, NP [facility] Pager: [phone]  05/25/2024, 11:12 AM

## 2024-05-25 NOTE — Procedures (Signed)
 Extubation Procedure Note  Patient Details:   Name: Clarence Maldonado DOB: 1946-02-07 MRN: [id number]   Airway Documentation:    Vent end date: 05/25/24 Vent end time: 0847   Evaluation  O2 sats: stable throughout and transiently fell during during procedure Complications: No apparent complications Patient did tolerate procedure well. Bilateral Breath Sounds: Rhonchi   Yes  BANYAN GOODCHILD 05/25/2024, 9:23 AM

## 2024-05-26 ENCOUNTER — Inpatient Hospital Stay (HOSPITAL_COMMUNITY)

## 2024-05-26 DIAGNOSIS — J441 Chronic obstructive pulmonary disease with (acute) exacerbation: Secondary | ICD-10-CM | POA: Diagnosis not present

## 2024-05-26 DIAGNOSIS — N179 Acute kidney failure, unspecified: Secondary | ICD-10-CM | POA: Diagnosis not present

## 2024-05-26 DIAGNOSIS — J9601 Acute respiratory failure with hypoxia: Secondary | ICD-10-CM | POA: Diagnosis not present

## 2024-05-26 DIAGNOSIS — J189 Pneumonia, unspecified organism: Secondary | ICD-10-CM | POA: Diagnosis not present

## 2024-05-26 LAB — GLUCOSE, CAPILLARY
Glucose-Capillary: 124 mg/dL — ABNORMAL HIGH (ref 70–99)
Glucose-Capillary: 130 mg/dL — ABNORMAL HIGH (ref 70–99)
Glucose-Capillary: 140 mg/dL — ABNORMAL HIGH (ref 70–99)
Glucose-Capillary: 182 mg/dL — ABNORMAL HIGH (ref 70–99)
Glucose-Capillary: 58 mg/dL — ABNORMAL LOW (ref 70–99)
Glucose-Capillary: 70 mg/dL (ref 70–99)
Glucose-Capillary: 89 mg/dL (ref 70–99)
Glucose-Capillary: 91 mg/dL (ref 70–99)

## 2024-05-26 LAB — CBC
HCT: 37.6 % — ABNORMAL LOW (ref 39.0–52.0)
Hemoglobin: 11.5 g/dL — ABNORMAL LOW (ref 13.0–17.0)
MCH: 26.7 pg (ref 26.0–34.0)
MCHC: 30.6 g/dL (ref 30.0–36.0)
MCV: 87.2 fL (ref 80.0–100.0)
Platelets: 227 K/uL (ref 150–400)
RBC: 4.31 MIL/uL (ref 4.22–5.81)
RDW: 15.6 % — ABNORMAL HIGH (ref 11.5–15.5)
WBC: 12.9 K/uL — ABNORMAL HIGH (ref 4.0–10.5)
nRBC: 0 % (ref 0.0–0.2)

## 2024-05-26 LAB — RENAL FUNCTION PANEL
Albumin: 2 g/dL — ABNORMAL LOW (ref 3.5–5.0)
Anion gap: 12 (ref 5–15)
BUN: 107 mg/dL — ABNORMAL HIGH (ref 8–23)
CO2: 30 mmol/L (ref 22–32)
Calcium: 8.4 mg/dL — ABNORMAL LOW (ref 8.9–10.3)
Chloride: 103 mmol/L (ref 98–111)
Creatinine, Ser: 2.24 mg/dL — ABNORMAL HIGH (ref 0.61–1.24)
GFR, Estimated: 29 mL/min — ABNORMAL LOW (ref >60)
Glucose, Bld: 138 mg/dL — ABNORMAL HIGH (ref 70–99)
Phosphorus: 5.4 mg/dL — ABNORMAL HIGH (ref 2.5–4.6)
Potassium: 4.2 mmol/L (ref 3.5–5.1)
Sodium: 145 mmol/L (ref 135–145)

## 2024-05-26 LAB — MAGNESIUM: Magnesium: 2.2 mg/dL (ref 1.7–2.4)

## 2024-05-26 MED ORDER — INSULIN GLARGINE 100 UNIT/ML ~~LOC~~ SOLN
25.0000 [IU] | Freq: Every day | SUBCUTANEOUS | Status: DC
  Administered 2024-05-26 – 2024-05-28 (×3): 25 [IU] via SUBCUTANEOUS
  Filled 2024-05-26 (×5): qty 0.25

## 2024-05-26 MED ORDER — DEXTROSE 50 % IV SOLN
12.5000 g | INTRAVENOUS | Status: AC
  Administered 2024-05-26: 12.5 g via INTRAVENOUS
  Filled 2024-05-26: qty 50

## 2024-05-26 NOTE — Evaluation (Signed)
 Occupational Therapy Evaluation Patient Details Name: Clarence Maldonado MRN: [id number] DOB: 1946/06/28 Today's Date: 05/26/2024   History of Present Illness   78 year old man who presented via EMS 8/8 for acute respiratory failure. EMS found in severe resp distress unresponsive. Pt intubated on arrival to [facility] and taken to ICU. Admitted for treatment of COPD, and HMPV infection. Developed perineal abscess, 8/15 underwent cystoscopy with I&D. Extubated 8/21  PMHx significant for HTN, HLD, COPD, CKD 3b, DMT2, BPH, R AKA, anxiety/depression.     Clinical Impressions Patient seen in conjunction with PT to assess current level. Patient calling out upon OT arrival, and attempting to get out of bed. Patient disoriented and inconsistent command following and unable to state any home history or prior level. Patient sitting up with min A, then not responding (unable to get a BP reading due to movement in RUE) with OT and PT laying patient in supine. Patient then not following any basic commands (squeeze my hand etc) and stating Im leaving. BP reassessed and stable, with RN present and patient finally responding to his name. OT recommending rehab < 3 hours when medically stable. OT will continue to follow.      If plan is discharge home, recommend the following:   Two people to help with walking and/or transfers;Two people to help with bathing/dressing/bathroom;Assistance with cooking/housework;Assistance with feeding;Direct supervision/assist for medications management;Assist for transportation;Direct supervision/assist for financial management;Help with stairs or ramp for entrance;Supervision due to cognitive status     Functional Status Assessment   Patient has had a recent decline in their functional status and demonstrates the ability to make significant improvements in function in a reasonable and predictable amount of time.     Equipment Recommendations   Other (comment) (will continue to  assess)     Recommendations for Other Services         Precautions/Restrictions   Precautions Precautions: Fall Precaution/Restrictions Comments: NG Tube, Foley, Flexiseal, mittens Restrictions Weight Bearing Restrictions Per Provider Order: No     Mobility Bed Mobility Overal bed mobility: Needs Assistance Bed Mobility: Supine to Sit, Sit to Supine     Supine to sit: Min assist Sit to supine: Contact guard assist   General bed mobility comments: min A posterior support after coming to semi longsitting side of bed with L LE off bed,did not encourge sitting fully EoB due to scrotal wound, pt able to lay himself back down in bed with out assist.    Transfers                   General transfer comment: deferred      Balance Overall balance assessment: Needs assistance Sitting-balance support: Feet unsupported, Bilateral upper extremity supported Sitting balance-Leahy Scale: Poor Sitting balance - Comments: requires light posterior support for safety                                   ADL either performed or assessed with clinical judgement   ADL Overall ADL's : Needs assistance/impaired Eating/Feeding: NPO   Grooming: Total assistance   Upper Body Bathing: Total assistance   Lower Body Bathing: Total assistance   Upper Body Dressing : Total assistance   Lower Body Dressing: Total assistance   Toilet Transfer: Total assistance   Toileting- Clothing Manipulation and Hygiene: Total assistance       Functional mobility during ADLs: Total assistance General ADL Comments: Patient seen in conjunction with  PT to assess current level. Patient calling out upon OT arrival, and attempting to get out of bed. Patient disoriented and inconsistent command following and unable to state any home history or prior level. Patient sitting up with min A, then not responding (unable to get a BP reading due to movement in RUE) with OT and PT laying patient  in supine. Patient then not following any basic commands (squeeze my hand etc) and stating Im leaving. BP reassessed and stable, with RN present and patient finally responding to his name. OT recommending rehab < 3 hours when medically stable. OT will continue to follow.     Vision   Additional Comments: unable to assess due to cognition     Perception Perception: Not tested       Praxis Praxis: Not tested       Pertinent Vitals/Pain Pain Assessment Pain Assessment: Faces Faces Pain Scale: Hurts a little bit Pain Location: generalized with movement Pain Descriptors / Indicators: Grimacing, Guarding Pain Intervention(s): Repositioned     Extremity/Trunk Assessment Upper Extremity Assessment Upper Extremity Assessment: Generalized weakness;Difficult to assess due to impaired cognition   Lower Extremity Assessment Lower Extremity Assessment: Defer to PT evaluation RLE Deficits / Details: R AKA LLE Deficits / Details: L LE ROM functional with movement   Cervical / Trunk Assessment Cervical / Trunk Assessment: Kyphotic   Communication Communication Communication: Impaired Factors Affecting Communication: Difficulty expressing self;Reduced clarity of speech   Cognition Arousal: Lethargic Behavior During Therapy: Impulsive, Restless Cognition: Cognition impaired   Orientation impairments: Place, Time, Situation Awareness: Intellectual awareness impaired, Online awareness impaired Memory impairment (select all impairments): Short-term memory, Working [employer] fast streamer, Non-declarative long-term memory, Geneticist, molecular long-term memory Attention impairment (select first level of impairment): Focused attention Executive functioning impairment (select all impairments): Initiation, Organization, Problem solving, Reasoning, Sequencing OT - Cognition Comments: Unable to follow commands, disoriented, no recall despite increased attempts                 Following commands:  Impaired Following commands impaired:  (pt only follow 1-2 commands for squeezing hand but then did not follow any more)     Cueing  General Comments   Cueing Techniques: Verbal cues;Tactile cues;Visual cues  Pt very restless, calling out on entry, BP 133/70 in supine, initially very animated sitting up with L LE over EoB, while attempting to take seated BP, all of the sudden pt lays back down and becomes less active, stating over and over I'm leaving not following any commands. BP 151/61 HR 74. RN in room and pt eventually answers to his name   Exercises     Shoulder Instructions      Home Living Family/patient expects to be discharged to:: Skilled nursing facility                                 Additional Comments: pt is poor historian, per 1/25 hospitalization he reported living in a mobile home      Prior Functioning/Environment Prior Level of Function : Patient poor historian/Family not available (does not answer any questions)             Mobility Comments: possibly has prosthetic for R LE      OT Problem List: Decreased strength;Decreased range of motion;Decreased activity tolerance;Impaired balance (sitting and/or standing);Decreased coordination;Decreased cognition;Decreased safety awareness;Decreased knowledge of use of DME or AE   OT Treatment/Interventions: Self-care/ADL training;Therapeutic exercise;Energy conservation;DME and/or AE instruction;Manual therapy;Patient/family  education;Balance training;Therapeutic activities;Cognitive remediation/compensation      OT Goals(Current goals can be found in the care plan section)   Acute Rehab OT Goals Patient Stated Goal: unable OT Goal Formulation: With patient Time For Goal Achievement: 06/09/24 Potential to Achieve Goals: Fair ADL Goals Pt Will Perform Grooming: with set-up;sitting Pt Will Perform Upper Body Bathing: with set-up;sitting Pt Will Perform Upper Body Dressing: with  set-up;sitting Pt Will Transfer to Toilet: with mod assist;stand pivot transfer;bedside commode Pt Will Perform Toileting - Clothing Manipulation and hygiene: with mod assist;sitting/lateral leans;sit to/from stand Additional ADL Goal #1: Patient will be able to follow 1-2 step commands consistently as a precursor to higher level cognitive tasks. Additional ADL Goal #2: Patient will be able to sit EOB for 5 minutes at [facility] completing ADLs or IADLs as a precursor to OOB ADLs.   OT Frequency:  Min 2X/week    Co-evaluation              AM-PAC OT 6 Clicks Daily Activity     Outcome Measure Help from another person eating meals?: Total Help from another person taking care of personal grooming?: Total Help from another person toileting, which includes using toliet, bedpan, or urinal?: Total Help from another person bathing (including washing, rinsing, drying)?: Total Help from another person to put on and taking off regular upper body clothing?: Total Help from another person to put on and taking off regular lower body clothing?: Total 6 Click Score: 6   End of Session Nurse Communication: Mobility status;Other (comment) (decreased command following)  Activity Tolerance: Patient limited by fatigue;Patient limited by lethargy;Patient limited by pain;Treatment limited secondary to medical complications (Comment) (decreased command following) Patient left: in bed;with call bell/phone within reach;with bed alarm set;with restraints reapplied  OT Visit Diagnosis: Unsteadiness on feet (R26.81);Other abnormalities of gait and mobility (R26.89);Muscle weakness (generalized) (M62.81);Other symptoms and signs involving cognitive function;Adult, failure to thrive (R62.7)                Time: 8744-8687 OT Time Calculation (min): 17 min Charges:  OT General Charges $OT Visit: 1 Visit OT Evaluation $OT Eval Moderate Complexity: 1 Mod  Ronal Gift E. Natasja Niday, OTR/L Acute Rehabilitation  Services [phone]   Ronal Gift Salt 05/26/2024, 3:50 PM

## 2024-05-26 NOTE — Progress Notes (Signed)
 Pt was NTS'd through right nare and tolerated well. Moderate tan/yellow thick sputum aspirated. Pt stable at this time.

## 2024-05-26 NOTE — Progress Notes (Signed)
 7 Days Post-Op Subjective: Now extubated. Not very communicative. Denies significant perineal pain. Tolerating bedside dressing changes.  Objective: Vital signs in last 24 hours: Temp:  [97.6 F (36.4 C)-98.5 F (36.9 C)] 98.5 F (36.9 C) (08/22 0020) Pulse Rate:  [64-91] 73 (08/22 0700) Resp:  [13-38] 20 (08/22 0700) BP: (121-189)/(53-129) 132/63 (08/22 0700) SpO2:  [68 %-100 %] 98 % (08/22 0700)  Intake/Output from previous day: 08/21 0701 - 08/22 0700 In: 3230.7 [I.V.:10.5; NG/GT:1925; IV Piggyback:555.3] Out: 4850 [Urine:4800; Stool:50] Intake/Output this shift: Total I/O In: 100 [NG/GT:100] Out: -   Physical Exam:  General: Alert and oriented CV: RRR Lungs: Clear Abdomen: Soft, ND, NT GU: Foley catheter in place draining clear yellow urine. Packing in place in perineum. No surrounding erythema, crepitus, necrosis or fluctuance.  Lab Results: Recent Labs    05/23/24 2330 05/25/24 0008 05/26/24 0456  HGB 11.6* 10.8* 11.5*  HCT 38.6* 35.1* 37.6*   BMET Recent Labs    05/25/24 0008 05/26/24 0456  NA 137 145  K 5.0 4.2  CL 99 103  CO2 28 30  GLUCOSE 187* 138*  BUN 108* 107*  CREATININE 2.42* 2.24*  CALCIUM  7.8* 8.4*     Studies/Results: DG Chest Port 1 View Result Date: 05/26/2024 CLINICAL DATA:  78 year old male with respiratory failure. EXAM: PORTABLE CHEST 1 VIEW COMPARISON:  Portable chest 05/21/2024 and earlier. FINDINGS: Portable AP semi upright view at 0426 hours. Enteric feeding tube courses to the abdomen, tip not included. Large lung volumes. Stable cardiac size and mediastinal contours. Calcified aortic atherosclerosis. Asymmetric coarse bilateral pulmonary interstitial opacity, emphysema and chronic interstitial lung disease demonstrated by CT in 2024. No pneumothorax, pleural effusion or confluent lung opacity. Stable ventilation since 05/17/2024. Stable visualized osseous structures. IMPRESSION: Chronic lung disease appears stable. No acute  cardiopulmonary abnormality. Emphysema (ICD10-J43.9). Electronically Signed   By: VEAR Hurst M.D.   On: 05/26/2024 08:49    Assessment/Plan: Perineal abscess s/p I&D on 05/09/2024  -Continue bedside daily wet-to-dry dressing exchanges with ICU nursing.  -Please notify urology with any concern of purulent discharge. -Please notify urology when patient discharges to floor to ensure good communication to floor nursing team regarding dressing changes. -Keep foley to gravity. -Following   LOS: 14 days   Matt R. Yanky Vanderburg MD 05/26/2024, 9:16 AM [facility]  Pager: [phone]

## 2024-05-26 NOTE — Progress Notes (Signed)
 NAME:  Clarence Maldonado, MRN:  [id number], DOB:  1945/11/08, LOS: 14 ADMISSION DATE:  05/12/2024, CONSULTATION DATE:  05/12/2024 REFERRING MD:  Cottie - EDP, CHIEF COMPLAINT:  Acute respiratory failure  History of Present Illness:  78 year old man who presented to [facility] 8/8 for acute respiratory failure. PMHx significant for HTN, HLD, COPD, CKD 3b, DMT2, BPH, R AKA, anxiety/depression.  Patient resides at rehab facility. Having SOB on 8/8 and prescribed rocephin /azithro. Worsening SOB. EMS called found in severe resp distress unresponsive. LMA placed and being bagged en route to [facility] ED. Wheezing b/l. Patient intubated on arrival. BP became soft post intubation requiring Levo. VBG 7.22/64/41/26. Labs pending. Given nebs, IV steroids, Mg. PCCM consulted for ICU admission.  Prior Echo in 2021 with EF of 30 to 35%. History of COPD. On triple therapy. No recent hospitalization for COPD.  Pertinent  Medical History   Past Medical History:  Diagnosis Date   Abnormal CT of the chest 2009   nodule 9 x 6 mm RUL   Benign localized hyperplasia of prostate with urinary obstruction and other lower urinary tract symptoms (LUTS)(600.21)    Cardiomegaly    Cerebrovascular accident (stroke) (HCC) 2009   lacunar infarct in the left thalamus   Cholelithiasis    Chronic airway obstruction, not elsewhere classified    Chronic kidney disease, stage III (moderate) (HCC)    Closed femur fracture (HCC) 1960   Contact with or exposure to viral disease    suspected    Illiterate    Obesity, unspecified    Other and unspecified hyperlipidemia    Special screening examination for respiratory tuberculosis    Tobacco use disorder    Tuberculosis    Type II or unspecified type diabetes mellitus with renal manifestations, uncontrolled(250.42)    Unspecified essential hypertension    Significant Hospital Events: Including procedures, antibiotic start and stop dates in addition to other pertinent events   8/8 - Admit  w/ COPD, HMPV infection, intubated. 8/13 - Weaning on vent, PSV 10/5, mild tachypnea. Remains on Precedex . 8/15 Developed perineal abscess.  Underwent cystoscopy and I&D with purulent drainage, wound packed 8/16 Failing PSV, on low dose levoped 8/19: failed SBT, off vasopressors. Treated for hyperkalemia 8/20: tol PS wean  05/25/2024 extubating  Interim History / Subjective:  No sig change overnight. Tol PS wean yesterday. Unable to get return call from daughter to establish goals of care   Objective:   Blood pressure 132/63, pulse 73, temperature 98.5 F (36.9 C), temperature source Oral, resp. rate 20, height 5' 5 (1.651 m), weight 64.9 kg, SpO2 98%.    Vent Mode: PSV;CPAP FiO2 (%):  [40 %] 40 % PEEP:  [5 cmH20] 5 cmH20 Pressure Support:  [5 cmH20] 5 cmH20   Intake/Output Summary (Last 24 hours) at 05/26/2024 0811 Last data filed at 05/26/2024 0734 Gross per 24 hour  Intake 3195.29 ml  Output 4650 ml  Net -1454.71 ml   Filed Weights   05/20/24 0457 05/23/24 0500 05/25/24 0419  Weight: 60.4 kg 65.4 kg 64.9 kg   Physical Examination: 79 year old male is activated but does not really follow commands notes pain in the groin area Copious secretions congested cough poor cough mechanics Heart sounds are distant Abdomen soft tube feedings were in place Foley catheter is in place bloody drainage from perineal abscess is noted Extremities are warm   Resolved Problem List:  Likely sedation induced hypotension  Assessment and Plan:   Acute respiratory failure w/ hypercapnia COPD exacerbation  Viral pneumonia, + metapneumovirus PsA on Resp Cx 8/11 Intubated 8/8, difficulty weaning  P: Extubate in 05/25/2024 Continues to need aggressive pulmonary toilet will need reintubation 05/26/2024 I reached out to family concerning CODE STATUS  Acute encephalopathy, likely metabolic and also sedating medications Reassuring Head CT; likely hypercapnia/sepsis related. Mild  hyperammonemia Continue to monitor  Perineal abscess with scrotal erythema/edema CT Pelvis 8/14 with 9.4 x 3.5 x 6.8cm fluid collection in inferior R perineum extending to the level of the testicles c/f abscess with diffuse scrotal wall edema and occlusion of the visualized R SFA. -8/15 patient underwent I&D, cystoscopy.   Appreciate urology's input  Sepsis Staph hominis bacteremia 8/8 Perineal abscess likely 2/2 to pneumonia, perineal abscess and possible UTI w/ large leukocytes Currently was Zyvox  and Zosyn  stop date [employer] fashion for candidemia Pulmonary toilet    AKI on CKD 3b, likely in the setting of septic ATN Anion gap metabolic acidosis, slightly elevated lactate Hypernatremia, hyperkalemia Lab Results  Component Value Date   CREATININE 2.24 (H) 05/26/2024   CREATININE 2.42 (H) 05/25/2024   CREATININE 2.52 (H) 05/23/2024   CREATININE 1.93 (H) 09/23/2023   CREATININE 1.92 (H) 06/24/2023   CREATININE 1.75 (H) 02/17/2023   Recent Labs  Lab 05/24/24 2032 05/25/24 0008 05/26/24 0456  K 5.1 5.0 4.2    Intake/Output Summary (Last 24 hours) at 05/26/2024 0818 Last data filed at 05/26/2024 0734 Gross per 24 hour  Intake 3195.29 ml  Output 4650 ml  Net -1454.71 ml     Remains >11L NET POS Baseline creatinine 1.4-1.8 P: Continue diuresis Monitor INO Monitor creatinine  DMT2 CBG (last 3)  Recent Labs    05/26/24 0413 05/26/24 0438 05/26/24 0732  GLUCAP 58* 140* 70    Treatment as needed  HTN: Holding home hypertensives  HLD Continue statin  BPH Currently with catheter in place resume Flomax  in future  Anxiety/depression  Angiolytics  Severe malnutrition, POA Continue tube feedings  Best Practice (right click and Reselect all SmartList Selections daily)   Diet/type: tubefeeds DVT prophylaxis prophylactic heparin   Pressure ulcer(s): N/A GI prophylaxis: H2B Lines: N/A Foley:  Yes, and it is still needed Code Status:  full  code Last date of multidisciplinary goals of care discussion [8/20 friend Norleen updated over phone] He does not have HCPOA paperwork. He did mention that patient does have a daughter that is not involved in the patient's life- no contact information available for her.  Daughter, [name] [phone] - initially made contact and was agreeable to assist with decision making per SW but unable to reach her 8/20, will try to reach her 05/26/2024  Addendum - attempted to contact pt's daughter tracy. Called x3. Left VM.  Critical care time:   App cct 21 min  Marcey Ahaan Zobrist ACNP Acute Care Nurse Practitioner [facility] Please consult Amion 05/26/2024, 8:11 AM

## 2024-05-26 NOTE — Progress Notes (Signed)
 PHARMACY PROGRESS NOTE  Discussed with CCM team about low morning cBG of 70 mg/dL and likely decrease in insulin  needs given discontinuation of corticosteroids; agreed to decrease Lantus  dose from 25 units BID to 25 units nightly.   Maurilio Patten, PharmD PGY1 Pharmacy Resident [facility] 05/26/2024 10:40 AM

## 2024-05-26 NOTE — Progress Notes (Signed)
 Discussed with CCM team famotidine  that was not home med for SUP, agreed to discontinue now that patient is extubated.  Maurilio Patten, PharmD PGY1 Pharmacy Resident [facility]  05/26/2024 11:14 AM

## 2024-05-26 NOTE — Evaluation (Signed)
 Physical Therapy Evaluation Patient Details Name: Clarence Maldonado MRN: [id number] DOB: 1946-02-22 Today's Date: 05/26/2024  History of Present Illness  78 year old man who presented via EMS 8/8 for acute respiratory failure. EMS found in severe resp distress unresponsive. Pt intubated on arrival to [facility] and taken to ICU. Admitted for treatment of COPD, and HMPV infection. Developed perineal abscess, 8/15 underwent cystoscopy with I&D. Extubated 8/21  PMHx significant for HTN, HLD, COPD, CKD 3b, DMT2, BPH, R AKA, anxiety/depression.  Clinical Impression  Pt in bed hollering out on entry. Unable to state his name but responds to his name otherwise not oriented to time, place or situation. Pt with limited initial command follow with hand squeeze. Pt then swing L LE over side of bed and comes to longsitting, needing only min A for posterior support for safety. Did not attempt to swivel hips to EoB given scrotal wound and not want to create any shear. Pt rambling on and then all the sudden stops lays back down, with eyes unblinking and repeats I'm leaving. BP take and in line with previous BP. Eventually, does respond to his name. Patient will benefit from continued inpatient follow up therapy, <3 hours/day.  PT will continue to follow acutely.          If plan is discharge home, recommend the following: A lot of help with walking and/or transfers;A lot of help with bathing/dressing/bathroom;Assistance with cooking/housework;Direct supervision/assist for medications management;Direct supervision/assist for financial management;Assist for transportation;Help with stairs or ramp for entrance;Supervision due to cognitive status   Can travel by private vehicle   No    Equipment Recommendations Rolling walker (2 wheels)     Functional Status Assessment Patient has had a recent decline in their functional status and demonstrates the ability to make significant improvements in function in a reasonable and  predictable amount of time.     Precautions / Restrictions Precautions Precautions: Fall Precaution/Restrictions Comments: NG Tube, Foley, Flexiseal, mittens Restrictions Weight Bearing Restrictions Per Provider Order: No      Mobility  Bed Mobility Overal bed mobility: Needs Assistance Bed Mobility: Supine to Sit, Sit to Supine     Supine to sit: Min assist Sit to supine: Contact guard assist   General bed mobility comments: min A posterior support after coming to semi longsitting side of bed with L LE off bed,did not encourge sitting fully EoB due to scrotal wound, pt able to lay himself back down in bed with out assist.    Transfers                   General transfer comment: deferred           Balance Overall balance assessment: Needs assistance Sitting-balance support: Feet unsupported, Bilateral upper extremity supported Sitting balance-Leahy Scale: Poor Sitting balance - Comments: requires light posterior support for safety                                     Pertinent Vitals/Pain Pain Assessment Pain Assessment: Faces Faces Pain Scale: Hurts a little bit Pain Location: generalized with movement Pain Descriptors / Indicators: Grimacing, Guarding Pain Intervention(s): Repositioned    Home Living Family/patient expects to be discharged to:: Skilled nursing facility                   Additional Comments: pt is poor historian, per 1/25 hospitalization he reported living in a mobile home  Prior Function Prior Level of Function : Patient poor historian/Family not available (does not answer any questions)             Mobility Comments: possibly has prosthetic for R LE       Extremity/Trunk Assessment   Upper Extremity Assessment Upper Extremity Assessment: Defer to OT evaluation    Lower Extremity Assessment Lower Extremity Assessment: LLE deficits/detail;RLE deficits/detail RLE Deficits / Details: R AKA LLE  Deficits / Details: L LE ROM functional with movement    Cervical / Trunk Assessment Cervical / Trunk Assessment: Kyphotic  Communication   Communication Communication: Impaired Factors Affecting Communication: Difficulty expressing self;Reduced clarity of speech    Cognition Arousal: Lethargic Behavior During Therapy: Impulsive, Restless   PT - Cognitive impairments: No family/caregiver present to determine baseline, Difficult to assess, Orientation, Awareness, Memory, Sequencing, Problem solving, Attention, Safety/Judgement Difficult to assess due to: Impaired communication Orientation impairments: Place, Time, Situation                   PT - Cognition Comments: pt does not state his name but answers to it Following commands: Impaired Following commands impaired:  (pt only follow 1-2 commands for squeezing hand but then did not follow any more)     Cueing Cueing Techniques: Verbal cues, Tactile cues, Visual cues     General Comments General comments (skin integrity, edema, etc.): Pt very restless, calling out on entry, BP 133/70 in supine, initially very animated sitting up with L LE over EoB, while attempting to take seated BP, all of the sudden pt lays back down and becomes less active, stating over and over I'm leaving not following any commands. BP 151/61 HR 74. RN in room and pt eventually answers to his name        Assessment/Plan    PT Assessment Patient needs continued PT services  PT Problem List Decreased strength;Decreased activity tolerance;Decreased balance;Decreased mobility;Decreased coordination;Decreased cognition;Impaired sensation;Decreased skin integrity;Pain       PT Treatment Interventions DME instruction;Gait training;Functional mobility training;Therapeutic activities;Therapeutic exercise;Balance training;Cognitive remediation;Patient/family education    PT Goals (Current goals can be found in the Care Plan section)  Acute Rehab PT  Goals PT Goal Formulation: Patient unable to participate in goal setting Time For Goal Achievement: 06/09/24 Potential to Achieve Goals: Poor    Frequency Min 2X/week        AM-PAC PT 6 Clicks Mobility  Outcome Measure Help needed turning from your back to your side while in a flat bed without using bedrails?: None Help needed moving from lying on your back to sitting on the side of a flat bed without using bedrails?: A Little Help needed moving to and from a bed to a chair (including a wheelchair)?: A Lot Help needed standing up from a chair using your arms (e.g., wheelchair or bedside chair)?: A Lot Help needed to walk in hospital room?: Total Help needed climbing 3-5 steps with a railing? : Total 6 Click Score: 13    End of Session   Activity Tolerance: Patient limited by fatigue;Treatment limited secondary to agitation Patient left: in bed;with call bell/phone within reach;with bed alarm set;with SCD's reapplied Nurse Communication: Mobility status;Need for lift equipment PT Visit Diagnosis: Difficulty in walking, not elsewhere classified (R26.2);Muscle weakness (generalized) (M62.81);Unsteadiness on feet (R26.81);Other symptoms and signs involving the nervous system (R29.898);Adult, failure to thrive (R62.7)    Time: 8684-8662 PT Time Calculation (min) (ACUTE ONLY): 22 min   Charges:   PT Evaluation $PT Eval Moderate  Complexity: 1 Mod   PT General Charges $$ ACUTE PT VISIT: 1 Visit         Lusero Nordlund B. Fleeta Lapidus PT, DPT Acute Rehabilitation Services Please use secure chat or  Call Office [phone]   Almarie KATHEE Fleeta Fleet 05/26/2024, 2:25 PM

## 2024-05-27 ENCOUNTER — Inpatient Hospital Stay (HOSPITAL_COMMUNITY)

## 2024-05-27 DIAGNOSIS — A419 Sepsis, unspecified organism: Secondary | ICD-10-CM | POA: Diagnosis not present

## 2024-05-27 DIAGNOSIS — J151 Pneumonia due to Pseudomonas: Secondary | ICD-10-CM | POA: Diagnosis not present

## 2024-05-27 DIAGNOSIS — J9602 Acute respiratory failure with hypercapnia: Secondary | ICD-10-CM | POA: Diagnosis not present

## 2024-05-27 DIAGNOSIS — G9340 Encephalopathy, unspecified: Secondary | ICD-10-CM | POA: Diagnosis not present

## 2024-05-27 LAB — CBC
HCT: 37.5 % — ABNORMAL LOW (ref 39.0–52.0)
Hemoglobin: 11.7 g/dL — ABNORMAL LOW (ref 13.0–17.0)
MCH: 27.7 pg (ref 26.0–34.0)
MCHC: 31.2 g/dL (ref 30.0–36.0)
MCV: 88.7 fL (ref 80.0–100.0)
Platelets: 216 K/uL (ref 150–400)
RBC: 4.23 MIL/uL (ref 4.22–5.81)
RDW: 15.9 % — ABNORMAL HIGH (ref 11.5–15.5)
WBC: 12 K/uL — ABNORMAL HIGH (ref 4.0–10.5)
nRBC: 0 % (ref 0.0–0.2)

## 2024-05-27 LAB — GLUCOSE, CAPILLARY
Glucose-Capillary: 88 mg/dL (ref 70–99)
Glucose-Capillary: 137 mg/dL — ABNORMAL HIGH (ref 70–99)
Glucose-Capillary: 195 mg/dL — ABNORMAL HIGH (ref 70–99)
Glucose-Capillary: 132 mg/dL — ABNORMAL HIGH (ref 70–99)
Glucose-Capillary: 148 mg/dL — ABNORMAL HIGH (ref 70–99)
Glucose-Capillary: 117 mg/dL — ABNORMAL HIGH (ref 70–99)

## 2024-05-27 LAB — BASIC METABOLIC PANEL WITH GFR
Anion gap: 10 (ref 5–15)
BUN: 106 mg/dL — ABNORMAL HIGH (ref 8–23)
CO2: 36 mmol/L — ABNORMAL HIGH (ref 22–32)
Calcium: 8.3 mg/dL — ABNORMAL LOW (ref 8.9–10.3)
Chloride: 105 mmol/L (ref 98–111)
Creatinine, Ser: 2.15 mg/dL — ABNORMAL HIGH (ref 0.61–1.24)
GFR, Estimated: 31 mL/min — ABNORMAL LOW (ref >60)
Glucose, Bld: 94 mg/dL (ref 70–99)
Potassium: 4.5 mmol/L (ref 3.5–5.1)
Sodium: 151 mmol/L — ABNORMAL HIGH (ref 135–145)

## 2024-05-27 LAB — MAGNESIUM: Magnesium: 2.2 mg/dL (ref 1.7–2.4)

## 2024-05-27 LAB — PHOSPHORUS: Phosphorus: 6.2 mg/dL — ABNORMAL HIGH (ref 2.5–4.6)

## 2024-05-27 MED ORDER — MELATONIN 3 MG PO TABS
3.0000 mg | ORAL_TABLET | Freq: Once | ORAL | Status: DC
  Filled 2024-05-27: qty 1

## 2024-05-27 MED ORDER — TAMSULOSIN HCL 0.4 MG PO CAPS
0.4000 mg | ORAL_CAPSULE | Freq: Every day | ORAL | Status: DC
  Administered 2024-05-28 – 2024-05-30 (×3): 0.4 mg via ORAL
  Filled 2024-05-27 (×3): qty 1

## 2024-05-27 MED ORDER — FREE WATER
200.0000 mL | Freq: Four times a day (QID) | Status: DC
  Administered 2024-05-27 – 2024-05-28 (×3): 200 mL

## 2024-05-27 MED ORDER — MELATONIN 3 MG PO TABS
3.0000 mg | ORAL_TABLET | Freq: Once | ORAL | Status: AC
  Administered 2024-05-27: 3 mg

## 2024-05-27 NOTE — Progress Notes (Signed)
 Pt stable and ready for transport. Report called but other RN unable to take report at this time

## 2024-05-27 NOTE — Progress Notes (Addendum)
 NAME:  Clarence Maldonado, MRN:  [id number], DOB:  28-Apr-1946, LOS: 15 ADMISSION DATE:  05/12/2024, CONSULTATION DATE:  05/12/2024 REFERRING MD:  Cottie - EDP, CHIEF COMPLAINT:  Acute respiratory failure  History of Present Illness:  78 year old man who presented to [facility] 8/8 for acute respiratory failure. PMHx significant for HTN, HLD, COPD, CKD 3b, DMT2, BPH, R AKA, anxiety/depression.  Patient resides at rehab facility. Having SOB on 8/8 and prescribed rocephin /azithro. Worsening SOB. EMS called found in severe resp distress unresponsive. LMA placed and being bagged en route to [facility] ED. Wheezing b/l. Patient intubated on arrival. BP became soft post intubation requiring Levo. VBG 7.22/64/41/26. Labs pending. Given nebs, IV steroids, Mg. PCCM consulted for ICU admission.  Prior Echo in 2021 with EF of 30 to 35%. History of COPD. On triple therapy. No recent hospitalization for COPD.  Pertinent  Medical History   Past Medical History:  Diagnosis Date   Abnormal CT of the chest 2009   nodule 9 x 6 mm RUL   Benign localized hyperplasia of prostate with urinary obstruction and other lower urinary tract symptoms (LUTS)(600.21)    Cardiomegaly    Cerebrovascular accident (stroke) (HCC) 2009   lacunar infarct in the left thalamus   Cholelithiasis    Chronic airway obstruction, not elsewhere classified    Chronic kidney disease, stage III (moderate) (HCC)    Closed femur fracture (HCC) 1960   Contact with or exposure to viral disease    suspected    Illiterate    Obesity, unspecified    Other and unspecified hyperlipidemia    Special screening examination for respiratory tuberculosis    Tobacco use disorder    Tuberculosis    Type II or unspecified type diabetes mellitus with renal manifestations, uncontrolled(250.42)    Unspecified essential hypertension    Significant Hospital Events: Including procedures, antibiotic start and stop dates in addition to other pertinent events   8/8 - Admit  w/ COPD, HMPV infection, intubated. 8/13 - Weaning on vent, PSV 10/5, mild tachypnea. Remains on Precedex . 8/15 Developed perineal abscess.  Underwent cystoscopy and I&D with purulent drainage, wound packed 8/16 Failing PSV, on low dose levoped 8/19: failed SBT, off vasopressors. Treated for hyperkalemia 8/20: tol PS wean  05/25/2024 extubating  Interim History / Subjective:  NAEO, NTS a few times yesterday   Objective:   Blood pressure 120/67, pulse 69, temperature 98.7 F (37.1 C), temperature source Oral, resp. rate (!) 22, height 5' 5 (1.651 m), weight 64.9 kg, SpO2 98%.        Intake/Output Summary (Last 24 hours) at 05/27/2024 1345 Last data filed at 05/27/2024 1211 Gross per 24 hour  Intake 1915.18 ml  Output 4000 ml  Net -2084.82 ml   Filed Weights   05/20/24 0457 05/23/24 0500 05/25/24 0419  Weight: 60.4 kg 65.4 kg 64.9 kg   Physical Examination: General: acutely and chronically ill M NAD Neuro: awake, oriented x2 and will follow some commands HEENT: Cortrak. NCAT  Pulm: scattered wet sounds upper fields, symmetrical chest expansion, even and unlabored CV: rr cap refill < 3 sec MSK: R AKA  GU: foley    Resolved Problem List:  Likely sedation induced hypotension Acute respiratory failure w hypercarbia  AECOPD   Assessment and Plan:   Acute encephalopathy  P -delirium precautions   Metapneumovirus PNA HCAP -- pseudomonas PNA (cx on 8/11) -extubated 8/21  P: -cont aggressive pulm hygiene measures including NTS as needed  -completed zosyn    Sepsis  due to perineal abscess with candida glabrata, improving HCAP above CT Pelvis 8/14 with 9.4 x 3.5 x 6.8cm fluid collection in inferior R perineum extending to the level of the testicles c/f abscess with diffuse scrotal wall edema and occlusion of the visualized R SFA. -s/p I&D cysto 8/15 P -completed zosyn  -on micafungin   -uro following, appreciate recs  -keep foley -will reach out to uro when he txf  out of ICU so they can ensure dressing change needs are appropriately communicated   AKI on CKD 3b Hypernatremia P -cont follow renal indices uop  -incr FWF 8/23   DM2 -lantus  25 u at bedtime + SSI + q4 hr 4u novolog    HTN HLD -BID metop, PRN hydral  -I am stopping his IV lasix  8/23 -- eval daily. Stopping for now as we are incr FWF related to Na  BPH -cont foley -will order for his home flomax  to start 8/24  Severe malnutrition, POA -EN   Dispo: -adequate airway protection, improving mentation. Weak overall but do think stable to txf out of ICU 8/23   Best Practice (right click and Reselect all SmartList Selections daily)   Diet/type: tubefeeds + full lliquid DVT prophylaxis prophylactic heparin   Pressure ulcer(s): N/A GI prophylaxis: H2B Lines: N/A Foley:  Yes, and it is still needed Code Status:  full code Last date of multidisciplinary goals of care discussion [8/20 friend Norleen updated over phone] He does not have HCPOA paperwork. He did mention that patient does have a daughter that is not involved in the patient's life- no contact information available for her.  Daughter, [name] [phone] - initially made contact and was agreeable to assist with decision making per SW but unable to reach her 8/20, will try to reach her 05/26/2024  Addendum - attempted to contact pt's daughter tracy. Called x3. Left VM.   CCT na  High MDM   Ronnald Gave MSN, AGACNP-BC [facility] Pulmonary/Critical Care Medicine Amion for pager  05/27/2024, 2:02 PM

## 2024-05-28 ENCOUNTER — Inpatient Hospital Stay (HOSPITAL_COMMUNITY)

## 2024-05-28 DIAGNOSIS — R918 Other nonspecific abnormal finding of lung field: Secondary | ICD-10-CM | POA: Diagnosis not present

## 2024-05-28 DIAGNOSIS — R0602 Shortness of breath: Secondary | ICD-10-CM | POA: Diagnosis not present

## 2024-05-28 DIAGNOSIS — J9602 Acute respiratory failure with hypercapnia: Secondary | ICD-10-CM | POA: Diagnosis not present

## 2024-05-28 DIAGNOSIS — I700 Atherosclerosis of aorta: Secondary | ICD-10-CM | POA: Diagnosis not present

## 2024-05-28 DIAGNOSIS — J949 Pleural condition, unspecified: Secondary | ICD-10-CM | POA: Diagnosis not present

## 2024-05-28 LAB — OSMOLALITY, URINE: Osmolality, Ur: 469 mosm/kg (ref 300–900)

## 2024-05-28 LAB — GLUCOSE, CAPILLARY
Glucose-Capillary: 103 mg/dL — ABNORMAL HIGH (ref 70–99)
Glucose-Capillary: 129 mg/dL — ABNORMAL HIGH (ref 70–99)
Glucose-Capillary: 130 mg/dL — ABNORMAL HIGH (ref 70–99)
Glucose-Capillary: 156 mg/dL — ABNORMAL HIGH (ref 70–99)
Glucose-Capillary: 197 mg/dL — ABNORMAL HIGH (ref 70–99)
Glucose-Capillary: 243 mg/dL — ABNORMAL HIGH (ref 70–99)
Glucose-Capillary: 52 mg/dL — ABNORMAL LOW (ref 70–99)
Glucose-Capillary: 52 mg/dL — ABNORMAL LOW (ref 70–99)
Glucose-Capillary: 53 mg/dL — ABNORMAL LOW (ref 70–99)

## 2024-05-28 LAB — BASIC METABOLIC PANEL WITH GFR
Anion gap: 14 (ref 5–15)
BUN: 97 mg/dL — ABNORMAL HIGH (ref 8–23)
CO2: 34 mmol/L — ABNORMAL HIGH (ref 22–32)
Calcium: 8.7 mg/dL — ABNORMAL LOW (ref 8.9–10.3)
Chloride: 101 mmol/L (ref 98–111)
Creatinine, Ser: 1.94 mg/dL — ABNORMAL HIGH (ref 0.61–1.24)
GFR, Estimated: 35 mL/min — ABNORMAL LOW (ref >60)
Glucose, Bld: 131 mg/dL — ABNORMAL HIGH (ref 70–99)
Potassium: 4.3 mmol/L (ref 3.5–5.1)
Sodium: 149 mmol/L — ABNORMAL HIGH (ref 135–145)

## 2024-05-28 LAB — C-REACTIVE PROTEIN: CRP: 3.2 mg/dL — ABNORMAL HIGH (ref <1.0)

## 2024-05-28 LAB — URIC ACID: Uric Acid, Serum: 5.9 mg/dL (ref 3.7–8.6)

## 2024-05-28 LAB — CBC WITH DIFFERENTIAL/PLATELET
Abs Immature Granulocytes: 0.08 K/uL — ABNORMAL HIGH (ref 0.00–0.07)
Basophils Absolute: 0.1 K/uL (ref 0.0–0.1)
Basophils Relative: 0 %
Eosinophils Absolute: 0.2 K/uL (ref 0.0–0.5)
Eosinophils Relative: 2 %
HCT: 40.3 % (ref 39.0–52.0)
Hemoglobin: 12.3 g/dL — ABNORMAL LOW (ref 13.0–17.0)
Immature Granulocytes: 1 %
Lymphocytes Relative: 5 %
Lymphs Abs: 0.6 K/uL — ABNORMAL LOW (ref 0.7–4.0)
MCH: 26.6 pg (ref 26.0–34.0)
MCHC: 30.5 g/dL (ref 30.0–36.0)
MCV: 87 fL (ref 80.0–100.0)
Monocytes Absolute: 0.5 K/uL (ref 0.1–1.0)
Monocytes Relative: 4 %
Neutro Abs: 10.1 K/uL — ABNORMAL HIGH (ref 1.7–7.7)
Neutrophils Relative %: 88 %
Platelets: 242 K/uL (ref 150–400)
RBC: 4.63 MIL/uL (ref 4.22–5.81)
RDW: 16 % — ABNORMAL HIGH (ref 11.5–15.5)
WBC: 11.6 K/uL — ABNORMAL HIGH (ref 4.0–10.5)
nRBC: 0 % (ref 0.0–0.2)

## 2024-05-28 LAB — PROTIME-INR
INR: 1 (ref 0.8–1.2)
Prothrombin Time: 14.1 s (ref 11.4–15.2)

## 2024-05-28 LAB — MAGNESIUM: Magnesium: 2.3 mg/dL (ref 1.7–2.4)

## 2024-05-28 LAB — OSMOLALITY: Osmolality: 347 mosm/kg (ref 275–295)

## 2024-05-28 LAB — PROCALCITONIN: Procalcitonin: 0.4 ng/mL

## 2024-05-28 LAB — SODIUM, URINE, RANDOM: Sodium, Ur: 85 mmol/L

## 2024-05-28 LAB — CREATININE, URINE, RANDOM: Creatinine, Urine: 27 mg/dL

## 2024-05-28 MED ORDER — DEXTROSE 50 % IV SOLN
INTRAVENOUS | Status: AC
  Filled 2024-05-28: qty 50

## 2024-05-28 MED ORDER — SODIUM CHLORIDE 0.45 % IV SOLN: INTRAVENOUS | Status: DC

## 2024-05-28 MED ORDER — DEXTROSE 50 % IV SOLN
1.0000 | Freq: Once | INTRAVENOUS | Status: AC
  Administered 2024-05-31: 50 mL via INTRAVENOUS
  Filled 2024-05-28: qty 50

## 2024-05-28 MED ORDER — FREE WATER
250.0000 mL | Status: DC
  Administered 2024-05-28 (×2): 250 mL

## 2024-05-28 MED ORDER — PANTOPRAZOLE SODIUM 40 MG IV SOLR
40.0000 mg | INTRAVENOUS | Status: DC
  Administered 2024-05-28 – 2024-05-29 (×2): 40 mg via INTRAVENOUS
  Filled 2024-05-28 (×2): qty 10

## 2024-05-28 NOTE — Progress Notes (Signed)
 Blood sugar low at 52. Hypoglycemic protocol initiated. Blood sugar recheck was 129.

## 2024-05-28 NOTE — Progress Notes (Signed)
 PROGRESS NOTE                                                                                                                                                                                                             Patient Demographics:    Clarence Maldonado, is a 78 y.o. male, DOB - 13-Jul-1946, FMW:[id number]  Outpatient Primary MD for the patient is Gil Greig BRAVO, NP    LOS - 16  Admit date - 05/12/2024    Chief Complaint  Patient presents with   Respiratory Distress       Brief Narrative (HPI from H&P)    78 year old man who presented to [facility] 8/8 for acute respiratory failure. PMHx significant for HTN, HLD, COPD, CKD 3b, DMT2, BPH, R AKA, anxiety/depression.   Patient resides at rehab facility. Having SOB on 8/8 and prescribed rocephin /azithro. Worsening SOB. EMS called found in severe resp distress unresponsive. LMA placed and being bagged en route to [facility] ED. Wheezing b/l. Patient intubated on arrival, he was kept in ICU, his workup was suggestive of metapneumovirus pneumonia, sepsis, he was further diagnosed with perineal abscess, drained by urology.  He was seen by PCCM, urology, ID.  He was stabilized and transferred to my care on 05/28/2024 on day 16 of his hospital stay.    Still has a NG tube, still appears extremely weak dehydrated and deconditioned, extremely poor cough reflex.    Significant Hospital Events:    8/8 - Admit w/ COPD, HMPV infection, intubated. 8/13 - Weaning on vent, PSV 10/5, mild tachypnea. Remains on Precedex . 8/15 Developed perineal abscess.  Underwent cystoscopy and I&D with purulent drainage, wound packed 8/16 Failing PSV, on low dose levoped 8/19: failed SBT, off vasopressors. Treated for hyperkalemia 8/20: tol PS wean  05/25/2024 extubating 05/28/2024.  Transferred to [facility]     Subjective:    Rhet Rorke today has, No headache, No chest pain, No abdominal pain - No Nausea, No new  weakness tingling or numbness, no SOB   Assessment  & Plan :    Sepsis upon admission due to combination of metapneumovirus and Pseudomonas pneumonia, perineal abscess with Candida glabrata - he is initially admitted to ICU and intubated, seen by urology, ID.  He underwent incision and drainage by urology on 05/19/2024.  Currently he has finished his antibiotics but continues on  micafungin , urology assisting with wound care, still quite weak and deconditioned monitor closely.  Metapneumovirus pneumonia, weak cough reflex, at risk for aspiration, dysphagia.  Currently on tube feeds along with free water  flushes, cough reflex quite poor, have requested speech therapy to evaluate, okay remains at risk for aspiration pneumonia monitor closely.  Acute encephalopathy  Due to above.  Monitor with supportive care   Severe dehydration with AKI - CKD 3B and hypernatremia.  AKI gradually improving, continue IV fluids and free water  flushes baseline creatinine appears to be close to 2   Dysphagia due to above.  Currently on tube feeds via NG tube.  Speech therapy consulted.    Chronic diastolic CHF.  EF around 70% this admission on 05/14/2024.  Currently compensated.  Continue beta-blocker.    BPH - cont foley, will order for his home flomax  to start 8/24   HTN  -BID metop, PRN hydral   DM2  -lantus  25 u at bedtime + SSI + q4 hr 4u novolog      Lab Results  Component Value Date   HGBA1C 5.7 (H) 09/23/2023   CBG (last 3)  Recent Labs    05/28/24 0404 05/28/24 0456 05/28/24 0749  GLUCAP 52* 129* 103*           Condition - Extremely Guarded  Family Communication  : Daughter [name] [phone]  on 05/28/2024 at 11 AM, message left  Code Status : Full code   Consults  : PCCM, ID, urology  PUD Prophylaxis : PPI   Procedures  :            Disposition Plan  :    Status is: Inpatient   DVT Prophylaxis  :    heparin  injection 5,000 Units Start: 05/20/24 0600 SCDs Start: 05/12/24  1851   Lab Results  Component Value Date   PLT 242 05/28/2024    Diet :  Diet Order             Diet full liquid Fluid consistency: Thin  Diet effective now                    Inpatient Medications  Scheduled Meds:  arformoterol   15 mcg Nebulization BID   atorvastatin   40 mg Per Tube Daily   budesonide  (PULMICORT ) nebulizer solution  0.5 mg Nebulization BID   Chlorhexidine  Gluconate Cloth  6 each Topical Daily   feeding supplement (PROSource TF20)  60 mL Per Tube BID   fiber  1 packet Per Tube BID   free water   250 mL Per Tube Q4H   heparin  injection (subcutaneous)  5,000 Units Subcutaneous Q8H   insulin  aspart  0-15 Units Subcutaneous Q4H   insulin  glargine  25 Units Subcutaneous QHS   metoprolol  tartrate  12.5 mg Per Tube BID   mouth rinse  15 mL Mouth Rinse 4 times per day   revefenacin   175 mcg Nebulization Daily   sodium zirconium cyclosilicate   10 g Per Tube Daily   tamsulosin   0.4 mg Oral Daily   Continuous Infusions:  sodium chloride      feeding supplement (OSMOLITE 1.2 CAL) Stopped (05/27/24 1455)   PRN Meds:.acetaminophen , albuterol , docusate, fentaNYL  (SUBLIMAZE ) injection, hydrALAZINE , metoprolol  tartrate, mouth rinse, oxyCODONE , polyethylene glycol    Objective:   Vitals:   05/28/24 0500 05/28/24 0750 05/28/24 0800 05/28/24 0818  BP: 136/72 (!) 153/67 (!) 143/71   Pulse: 100 75 74 78  Resp: (!) 21 13 17    Temp:  98.4 F (36.9  C)    TempSrc:  Oral    SpO2: 98% 99% 100%   Weight: 57.6 kg     Height:        Wt Readings from Last 3 Encounters:  05/28/24 57.6 kg  10/28/23 70.3 kg  10/14/23 71.2 kg     Intake/Output Summary (Last 24 hours) at 05/28/2024 1058 Last data filed at 05/28/2024 9386 Gross per 24 hour  Intake 1545.39 ml  Output 2400 ml  Net -854.61 ml     Physical Exam  Frail elderly male, awake Alert, No new F.N deficits,   Tyrone.AT,PERRAL Supple Neck, No JVD,   Symmetrical Chest wall movement, Good air movement  bilaterally, CTAB RRR,No Gallops,Rubs or new Murmurs,  +ve B.Sounds, Abd Soft, No tenderness,   No Cyanosis, Foley catheter in place, NG tube in place, right nasal trumpet, right AKA       Data Review:    Recent Labs  Lab 05/23/24 2330 05/25/24 0008 05/26/24 0456 05/27/24 0418 05/28/24 0602  WBC 11.7* 10.8* 12.9* 12.0* 11.6*  HGB 11.6* 10.8* 11.5* 11.7* 12.3*  HCT 38.6* 35.1* 37.6* 37.5* 40.3  PLT 149* 172 227 216 242  MCV 90.0 88.9 87.2 88.7 87.0  MCH 27.0 27.3 26.7 27.7 26.6  MCHC 30.1 30.8 30.6 31.2 30.5  RDW 15.8* 15.6* 15.6* 15.9* 16.0*  LYMPHSABS  --   --   --   --  0.6*  MONOABS  --   --   --   --  0.5  EOSABS  --   --   --   --  0.2  BASOSABS  --   --   --   --  0.1    Recent Labs  Lab 05/23/24 2330 05/24/24 0646 05/24/24 2032 05/25/24 0008 05/26/24 0456 05/27/24 0418 05/28/24 0601 05/28/24 0602  NA 138  --   --  137 145 151*  --  149*  K 5.5*   < > 5.1 5.0 4.2 4.5  --  4.3  CL 102  --   --  99 103 105  --  101  CO2 25  --   --  28 30 36*  --  34*  ANIONGAP 11  --   --  10 12 10   --  14  GLUCOSE 261*  --   --  187* 138* 94  --  131*  BUN 99*  --   --  108* 107* 106*  --  97*  CREATININE 2.52*  --   --  2.42* 2.24* 2.15*  --  1.94*  ALBUMIN  --   --   --   --  2.0*  --   --   --   CRP  --   --   --   --   --   --   --  3.2*  PROCALCITON  --   --   --   --   --   --   --  0.40  INR  --   --   --   --   --   --  1.0  --   MG  --   --   --   --  2.2 2.2  --  2.3  PHOS  --   --   --   --  5.4* 6.2*  --   --   CALCIUM  7.9*  --   --  7.8* 8.4* 8.3*  --  8.7*   < > = values in this  interval not displayed.      Recent Labs  Lab 05/23/24 2330 05/25/24 0008 05/26/24 0456 05/27/24 0418 05/28/24 0601 05/28/24 0602  CRP  --   --   --   --   --  3.2*  PROCALCITON  --   --   --   --   --  0.40  INR  --   --   --   --  1.0  --   MG  --   --  2.2 2.2  --  2.3  CALCIUM  7.9* 7.8* 8.4* 8.3*  --  8.7*     --------------------------------------------------------------------------------------------------------------- Lab Results  Component Value Date   CHOL 82 05/12/2024   HDL 27 (L) 05/12/2024   LDLCALC 39 05/12/2024   TRIG 61 05/25/2024   CHOLHDL 3.0 05/12/2024    Lab Results  Component Value Date   HGBA1C 5.7 (H) 09/23/2023   No results for input(s): TSH, T4TOTAL, FREET4, T3FREE, THYROIDAB in the last 72 hours. No results for input(s): VITAMINB12, FOLATE, FERRITIN, TIBC, IRON , RETICCTPCT in the last 72 hours. ------------------------------------------------------------------------------------------------------------------ Cardiac Enzymes No results for input(s): CKMB, TROPONINI, MYOGLOBIN in the last 168 hours.  Invalid input(s): CK  Micro Results Recent Results (from the past 240 hours)  Aerobic/Anaerobic Culture w Gram Stain (surgical/deep wound)     Status: None   Collection Time: 05/19/24  7:15 AM   Specimen: Scrotum; GU  Result Value Ref Range Status   Specimen Description SCROTUM  Final   Special Requests NONE  Final   Gram Stain NO WBC SEEN RARE BUDDING YEAST SEEN   Final   Culture FEW CANDIDA GLABRATA NO ANAEROBES ISOLATED   Final   Report Status 05/24/2024 FINAL  Final    Radiology Report DG Chest Port 1 View Result Date: 05/28/2024 EXAM: 1 VIEW XRAY OF THE CHEST 05/28/2024 07:01:00 AM COMPARISON: 05/27/2024 CLINICAL HISTORY: [id number] SOB (shortness of breath) [id number]. Reason for exam: pt order states SOB (shortness of breath) SOB (shortness of breath) FINDINGS: LUNGS AND PLEURA: Chronic parenchymal disease and left upper lung opacities. No acute findings. HEART AND MEDIASTINUM: No acute abnormality of the cardiac and mediastinal silhouettes. Aortic atherosclerosis (ICD10-I70.0). BONES AND SOFT TISSUES: No acute osseous abnormality. Feeding tube extends at least as far as stomach, type not seen. IMPRESSION: 1. No acute findings. 2.  Chronic parenchymal disease and left upper lung opacities. Electronically signed by: Katheleen Faes MD 05/28/2024 07:28 AM EDT RP Workstation: [id number]   DG Chest Port 1 View Result Date: 05/27/2024 CLINICAL DATA:  Respiratory failure. EXAM: PORTABLE CHEST 1 VIEW COMPARISON:  05/26/2024 FINDINGS: Lungs are hyperexpanded. Chronic interstitial changes again noted with areas of architectural distortion in the upper lungs bilaterally and left base. No dense focal consolidative disease. No substantial pleural effusion. The cardiopericardial silhouette is within normal limits for size. A feeding tube passes into the stomach although the distal tip position is not included on the film. Telemetry leads overlie the chest. IMPRESSION: Hyperexpansion with chronic interstitial changes and areas of architectural distortion in the upper lungs bilaterally and left base. No substantial interval change. Electronically Signed   By: Camellia Candle M.D.   On: 05/27/2024 06:20     Signature  -   Lavada Stank M.D on 05/28/2024 at 10:58 AM   -  To page go to [email or web address]

## 2024-05-28 NOTE — Progress Notes (Signed)
 Patient doing well.  Reviewed wound packing packing was done wet-to-dry wound edges appear healthy.  Nursing can continue to do local wound care.

## 2024-05-28 NOTE — Progress Notes (Signed)
 Patient asymptomatic with blood sugar of 53. Patient able to drink two apple juices. Recheck blood sugar 52, patient given 1 amp of d50.

## 2024-05-28 NOTE — Evaluation (Signed)
 Clinical/Bedside Swallow Evaluation Patient Details  Name: Clarence Maldonado MRN: [id number] Date of Birth: [birth year]  Today's Date: 05/28/2024 Time: SLP Start Time (ACUTE ONLY): 1352 SLP Stop Time (ACUTE ONLY): 1412 SLP Time Calculation (min) (ACUTE ONLY): 20 min  Past Medical History:  Past Medical History:  Diagnosis Date   Abnormal CT of the chest 2009   nodule 9 x 6 mm RUL   Benign localized hyperplasia of prostate with urinary obstruction and other lower urinary tract symptoms (LUTS)(600.21)    Cardiomegaly    Cerebrovascular accident (stroke) (HCC) 2009   lacunar infarct in the left thalamus   Cholelithiasis    Chronic airway obstruction, not elsewhere classified    Chronic kidney disease, stage III (moderate) (HCC)    Closed femur fracture (HCC) 1960   Contact with or exposure to viral disease    suspected    Illiterate    Obesity, unspecified    Other and unspecified hyperlipidemia    Special screening examination for respiratory tuberculosis    Tobacco use disorder    Tuberculosis    Type II or unspecified type diabetes mellitus with renal manifestations, uncontrolled(250.42)    Unspecified essential hypertension    Past Surgical History:  Past Surgical History:  Procedure Laterality Date   ABDOMINAL AORTOGRAM W/LOWER EXTREMITY N/A 10/25/2023   Procedure: ABDOMINAL AORTOGRAM W/LOWER EXTREMITY;  Surgeon: Pearline Norman RAMAN, MD;  Location: [facility];  Service: Cardiovascular;  Laterality: N/A;   AMPUTATION Right 10/28/2023   Procedure: AMPUTATION ABOVE KNEE;  Surgeon: Serene Gaile ORN, MD;  Location: [facility] OR;  Service: Vascular;  Laterality: Right;   CYSTOSCOPY  05/19/2024   Procedure: PHYLLIS SIDE;  Surgeon: Selma Donnice SAUNDERS, MD;  Location: [facility] OR;  Service: Urology;;   FEMUR FRACTURE SURGERY Left 1960   INCISION AND DRAINAGE ABSCESS N/A 05/19/2024   Procedure: INCISION AND DRAINAGE, PERINEAL ABSCESS;  Surgeon: Selma Donnice SAUNDERS, MD;  Location: [facility] OR;  Service:  Urology;  Laterality: N/A;  Incision and Drainage of Scrotal abscess   HPI:  Patient is a 78 y.o. male with PMH: COPD, CDK 3b, DMT2, HTN, HLD, BPH, R AKA, anxiety/depression. During 2020 hospitalization he required SLP assessment and intervention due to dysphagia but did advance to regular solids, thin liquids with no f/u warranted. No documented dysphagia intervention since 2020 seen per chart review. Patient presented to the hospital on 05/12/2024 from SNF with worsening SOB. When EMS arrived he was in severe respiratory distress and unresponsive. Patient intubated on arrival, he was kept in ICU, his workup was suggestive of metapneumovirus pneumonia, sepsis, he was further diagnosed with perineal abscess, drained by urology.  He was extubated on 05/25/24. He passed [facility] swallow screen with RN on 05/27/24 and has been on full liquids diet. Cortrak feeding tube is in place. SLP swallow evaluation ordered on 05/28/2024 to assess his swallow function as he has a weak cough, is dehydrated, has nasal trumpet in place for suctioning.    Assessment / Plan / Recommendation  Clinical Impression  Patient presents with clinical s/s of dysphagia as per this bedside swallow evaluation. He was awake, alert but very confused and constantly moaning about what he described as chest pain. He was able to hold a cup of water  and drink but initially required mod-maximal cues to do so. Swallow initiation appeared timely and no overt s/s of aspiration observed. He tolerated bites of pudding without observed difficulty. SLP recommending advancing from full liquids to dys 1 puree solids and will  follow for toleration and ability to advance. SLP Visit Diagnosis: Dysphagia, unspecified (R13.10)    Aspiration Risk  Mild aspiration risk    Diet Recommendation Dysphagia 1 (Puree);Thin liquid    Liquid Administration via: Cup;Straw Medication Administration: Crushed with puree Compensations: Slow rate;Small sips/bites;Minimize  environmental distractions Postural Changes: Seated upright at 90 degrees    Other  Recommendations Oral Care Recommendations: Oral care BID;Staff/trained caregiver to provide oral care     Assistance Recommended at Discharge    Functional Status Assessment Patient has had a recent decline in their functional status and demonstrates the ability to make significant improvements in function in a reasonable and predictable amount of time.  Frequency and Duration min 2x/week  1 week       Prognosis Prognosis for improved oropharyngeal function: Good Barriers to Reach Goals: Cognitive deficits      Swallow Study   General Date of Onset: 05/28/24 HPI: Patient is a 78 y.o. male with PMH: COPD, CDK 3b, DMT2, HTN, HLD, BPH, R AKA, anxiety/depression. During 2020 hospitalization he required SLP assessment and intervention due to dysphagia but did advance to regular solids, thin liquids with no f/u warranted. No documented dysphagia intervention since 2020 seen per chart review. Patient presented to the hospital on 05/12/2024 from SNF with worsening SOB. When EMS arrived he was in severe respiratory distress and unresponsive. Patient intubated on arrival, he was kept in ICU, his workup was suggestive of metapneumovirus pneumonia, sepsis, he was further diagnosed with perineal abscess, drained by urology.  He was extubated on 05/25/24. He passed [facility] swallow screen with RN on 05/27/24 and has been on full liquids diet. Cortrak feeding tube is in place. SLP swallow evaluation ordered on 05/28/2024 to assess his swallow function as he has a weak cough, is dehydrated, has nasal trumpet in place for suctioning. Type of Study: Bedside Swallow Evaluation Previous Swallow Assessment: 2020 BSE, MBS Diet Prior to this Study: Thin liquids (Level 0);Full liquid diet Temperature Spikes Noted: No Respiratory Status: Nasal cannula History of Recent Intubation: Yes Behavior/Cognition: Alert;Cooperative;Confused;Requires  cueing Oral Cavity Assessment: Dry Oral Care Completed by SLP: Yes Oral Cavity - Dentition: Edentulous Patient Positioning: Upright in bed Baseline Vocal Quality: Normal Volitional Cough: Weak Volitional Swallow: Unable to elicit    Oral/Motor/Sensory Function Overall Oral Motor/Sensory Function: Within functional limits   Ice Chips     Thin Liquid Thin Liquid: Impaired Presentation: Cup Oral Phase Impairments: Poor awareness of bolus    Nectar Thick     Honey Thick     Puree Puree: Within functional limits Presentation: Spoon   Solid     Solid: Not tested      Norleen IVAR Blase, MA, CCC-SLP Speech Therapy

## 2024-05-29 DIAGNOSIS — J9602 Acute respiratory failure with hypercapnia: Secondary | ICD-10-CM | POA: Diagnosis not present

## 2024-05-29 LAB — URINALYSIS, ROUTINE W REFLEX MICROSCOPIC
Bacteria, UA: NONE SEEN
Bilirubin Urine: NEGATIVE
Glucose, UA: NEGATIVE mg/dL
Ketones, ur: NEGATIVE mg/dL
Leukocytes,Ua: NEGATIVE
Nitrite: NEGATIVE
Protein, ur: NEGATIVE mg/dL
Specific Gravity, Urine: 1.016 (ref 1.005–1.030)
pH: 6 (ref 5.0–8.0)

## 2024-05-29 LAB — GLUCOSE, CAPILLARY
Glucose-Capillary: 110 mg/dL — ABNORMAL HIGH (ref 70–99)
Glucose-Capillary: 125 mg/dL — ABNORMAL HIGH (ref 70–99)
Glucose-Capillary: 135 mg/dL — ABNORMAL HIGH (ref 70–99)
Glucose-Capillary: 172 mg/dL — ABNORMAL HIGH (ref 70–99)
Glucose-Capillary: 47 mg/dL — ABNORMAL LOW (ref 70–99)
Glucose-Capillary: 77 mg/dL (ref 70–99)
Glucose-Capillary: 88 mg/dL (ref 70–99)

## 2024-05-29 LAB — COMPREHENSIVE METABOLIC PANEL WITH GFR
ALT: 18 U/L (ref 0–44)
AST: 18 U/L (ref 15–41)
Albumin: 1.9 g/dL — ABNORMAL LOW (ref 3.5–5.0)
Alkaline Phosphatase: 50 U/L (ref 38–126)
Anion gap: 7 (ref 5–15)
BUN: 92 mg/dL — ABNORMAL HIGH (ref 8–23)
CO2: 32 mmol/L (ref 22–32)
Calcium: 8.4 mg/dL — ABNORMAL LOW (ref 8.9–10.3)
Chloride: 105 mmol/L (ref 98–111)
Creatinine, Ser: 1.75 mg/dL — ABNORMAL HIGH (ref 0.61–1.24)
GFR, Estimated: 40 mL/min — ABNORMAL LOW (ref >60)
Glucose, Bld: 79 mg/dL (ref 70–99)
Potassium: 4.5 mmol/L (ref 3.5–5.1)
Sodium: 144 mmol/L (ref 135–145)
Total Bilirubin: 0.9 mg/dL (ref 0.0–1.2)
Total Protein: 5.8 g/dL — ABNORMAL LOW (ref 6.5–8.1)

## 2024-05-29 LAB — CBC WITH DIFFERENTIAL/PLATELET
Abs Immature Granulocytes: 0.07 K/uL (ref 0.00–0.07)
Basophils Absolute: 0 K/uL (ref 0.0–0.1)
Basophils Relative: 0 %
Eosinophils Absolute: 0.3 K/uL (ref 0.0–0.5)
Eosinophils Relative: 3 %
HCT: 35.8 % — ABNORMAL LOW (ref 39.0–52.0)
Hemoglobin: 11 g/dL — ABNORMAL LOW (ref 13.0–17.0)
Immature Granulocytes: 1 %
Lymphocytes Relative: 8 %
Lymphs Abs: 0.8 K/uL (ref 0.7–4.0)
MCH: 26.9 pg (ref 26.0–34.0)
MCHC: 30.7 g/dL (ref 30.0–36.0)
MCV: 87.5 fL (ref 80.0–100.0)
Monocytes Absolute: 0.6 K/uL (ref 0.1–1.0)
Monocytes Relative: 6 %
Neutro Abs: 8.8 K/uL — ABNORMAL HIGH (ref 1.7–7.7)
Neutrophils Relative %: 82 %
Platelets: 224 K/uL (ref 150–400)
RBC: 4.09 MIL/uL — ABNORMAL LOW (ref 4.22–5.81)
RDW: 16.2 % — ABNORMAL HIGH (ref 11.5–15.5)
WBC: 10.6 K/uL — ABNORMAL HIGH (ref 4.0–10.5)
nRBC: 0 % (ref 0.0–0.2)

## 2024-05-29 LAB — MAGNESIUM: Magnesium: 2 mg/dL (ref 1.7–2.4)

## 2024-05-29 LAB — UREA NITROGEN, URINE: Urea Nitrogen, Ur: 790 mg/dL

## 2024-05-29 LAB — PROCALCITONIN: Procalcitonin: 0.43 ng/mL

## 2024-05-29 LAB — PHOSPHORUS: Phosphorus: 5.4 mg/dL — ABNORMAL HIGH (ref 2.5–4.6)

## 2024-05-29 LAB — C-REACTIVE PROTEIN: CRP: 2.4 mg/dL — ABNORMAL HIGH (ref <1.0)

## 2024-05-29 MED ORDER — OMEPRAZOLE 2 MG/ML ORAL SUSPENSION
40.0000 mg | Freq: Every day | ORAL | Status: DC
  Administered 2024-05-30: 40 mg
  Filled 2024-05-29 (×3): qty 20

## 2024-05-29 MED ORDER — INSULIN GLARGINE 100 UNIT/ML ~~LOC~~ SOLN
18.0000 [IU] | Freq: Every day | SUBCUTANEOUS | Status: DC
  Administered 2024-05-29: 18 [IU] via SUBCUTANEOUS
  Filled 2024-05-29 (×2): qty 0.18

## 2024-05-29 MED ORDER — INSULIN ASPART 100 UNIT/ML IJ SOLN
0.0000 [IU] | Freq: Three times a day (TID) | INTRAMUSCULAR | Status: DC
  Administered 2024-05-29 – 2024-05-31 (×4): 1 [IU] via SUBCUTANEOUS
  Administered 2024-06-01 (×2): 2 [IU] via SUBCUTANEOUS
  Administered 2024-06-02: 3 [IU] via SUBCUTANEOUS
  Administered 2024-06-03: 1 [IU] via SUBCUTANEOUS
  Administered 2024-06-03: 2 [IU] via SUBCUTANEOUS
  Administered 2024-06-05: 1 [IU] via SUBCUTANEOUS
  Administered 2024-06-05: 2 [IU] via SUBCUTANEOUS
  Administered 2024-06-05: 1 [IU] via SUBCUTANEOUS
  Administered 2024-06-06: 2 [IU] via SUBCUTANEOUS
  Administered 2024-06-06: 1 [IU] via SUBCUTANEOUS
  Administered 2024-06-06 – 2024-06-07 (×2): 2 [IU] via SUBCUTANEOUS
  Administered 2024-06-07: 3 [IU] via SUBCUTANEOUS
  Administered 2024-06-08: 5 [IU] via SUBCUTANEOUS
  Administered 2024-06-09: 3 [IU] via SUBCUTANEOUS

## 2024-05-29 NOTE — Plan of Care (Signed)
 Wound care already completed on my arrival.  Thankfully his nurse today and tomorrow was previously a wound care nurse.  She and I will plan to do his wound care on rounds tomorrow.

## 2024-05-29 NOTE — Plan of Care (Signed)
  Problem: Activity: Goal: Ability to tolerate increased activity will improve Outcome: Progressing   Problem: Respiratory: Goal: Ability to maintain a clear airway and adequate ventilation will improve Outcome: Progressing   Problem: Role Relationship: Goal: Method of communication will improve Outcome: Progressing   Problem: Education: Goal: Knowledge of General Education information will improve Description: Including pain rating scale, medication(s)/side effects and non-pharmacologic comfort measures Outcome: Progressing   Problem: Health Behavior/Discharge Planning: Goal: Ability to manage health-related needs will improve Outcome: Progressing   Problem: Clinical Measurements: Goal: Ability to maintain clinical measurements within normal limits will improve Outcome: Progressing Goal: Will remain free from infection Outcome: Progressing Goal: Diagnostic test results will improve Outcome: Progressing Goal: Respiratory complications will improve Outcome: Progressing Goal: Cardiovascular complication will be avoided Outcome: Progressing   Problem: Activity: Goal: Risk for activity intolerance will decrease Outcome: Progressing   Problem: Nutrition: Goal: Adequate nutrition will be maintained Outcome: Progressing   Problem: Coping: Goal: Level of anxiety will decrease Outcome: Progressing   Problem: Elimination: Goal: Will not experience complications related to bowel motility Outcome: Progressing Goal: Will not experience complications related to urinary retention Outcome: Progressing   Problem: Pain Managment: Goal: General experience of comfort will improve and/or be controlled Outcome: Progressing   Problem: Safety: Goal: Ability to remain free from injury will improve Outcome: Progressing   Problem: Skin Integrity: Goal: Risk for impaired skin integrity will decrease Outcome: Progressing   Problem: Safety: Goal: Non-violent Restraint(s) Outcome:  Progressing

## 2024-05-29 NOTE — Progress Notes (Signed)
 PROGRESS NOTE                                                                                                                                                                                                             Patient Demographics:    Clarence Maldonado, is a 78 y.o. male, DOB - [birth year], FMW:[id number]  Outpatient Primary MD for the patient is Gil Greig BRAVO, NP    LOS - 17  Admit date - 05/12/2024    Chief Complaint  Patient presents with   Respiratory Distress       Brief Narrative (HPI from H&P)    78 year old man who presented to [facility] 8/8 for acute respiratory failure. PMHx significant for HTN, HLD, COPD, CKD 3b, DMT2, BPH, R AKA, anxiety/depression.   Patient resides at rehab facility. Having SOB on 8/8 and prescribed rocephin /azithro. Worsening SOB. EMS called found in severe resp distress unresponsive. LMA placed and being bagged en route to [facility] ED. Wheezing b/l. Patient intubated on arrival, he was kept in ICU, his workup was suggestive of metapneumovirus pneumonia, sepsis, he was further diagnosed with perineal abscess, drained by urology.  He was seen by PCCM, urology, ID.  He was stabilized and transferred to my care on 05/28/2024 on day 16 of his hospital stay.    Still has a NG tube, still appears extremely weak dehydrated and deconditioned, extremely poor cough reflex.    Significant Hospital Events:    8/8 - Admit w/ COPD, HMPV infection, intubated. 8/13 - Weaning on vent, PSV 10/5, mild tachypnea. Remains on Precedex . 8/15 Developed perineal abscess.  Underwent cystoscopy and I&D with purulent drainage, wound packed 8/16 Failing PSV, on low dose levoped 8/19: failed SBT, off vasopressors. Treated for hyperkalemia 8/20: tol PS wean  05/25/2024 extubating 05/28/2024.  Transferred to [facility]     Subjective:   Patient in bed, appears comfortable, denies any headache, no fever, no chest pain or  pressure, no shortness of breath , no abdominal pain. No focal weakness.   Assessment  & Plan :    Sepsis upon admission due to combination of metapneumovirus and Pseudomonas pneumonia, perineal abscess with Candida glabrata - he is initially admitted to ICU and intubated, seen by urology, ID.  He underwent incision and drainage by urology on 05/19/2024.  Currently he has finished his antibiotics &  micafungin , urology assisting with wound care for his perineal abscess, still quite weak and deconditioned monitor closely.  Metapneumovirus pneumonia, weak cough reflex, at risk for aspiration, dysphagia.  Currently on tube feeds along with free water  flushes, cough reflex quite poor, have requested speech therapy to evaluate, okay remains at risk for aspiration pneumonia monitor closely.  Acute encephalopathy  Due to above.  Monitor with supportive care   Severe dehydration with AKI - CKD 3B and hypernatremia.  AKI gradually improving, continue IV fluids and free water  flushes baseline creatinine appears to be close to 2   Dysphagia due to above.  In by speech therapy NG tube discontinued on 05/28/2024 evening, now on oral diet dysphagia 1.  Monitor  Chronic diastolic CHF.  EF around 70% this admission on 05/14/2024.  Currently compensated.  Continue beta-blocker.    BPH - cont foley, will order for his home flomax  to start 8/24   HTN  -BID metop, PRN hydral   DM2  -lantus  25 u at bedtime + SSI,  dose adjusted 05/29/2024.    Lab Results  Component Value Date   HGBA1C 5.7 (H) 09/23/2023   CBG (last 3)  Recent Labs    05/29/24 0335 05/29/24 0434 05/29/24 0847  GLUCAP 47* 172* 88           Condition - Extremely Guarded  Family Communication  : Daughter [name] [phone]  on 05/28/2024 at 11 AM, message left  Code Status : Full code   Consults  : PCCM, ID, urology  PUD Prophylaxis : PPI   Procedures  :            Disposition Plan  :    Status is: Inpatient   DVT  Prophylaxis  :    heparin  injection 5,000 Units Start: 05/20/24 0600 SCDs Start: 05/12/24 1851   Lab Results  Component Value Date   PLT 224 05/29/2024    Diet :  Diet Order             DIET - DYS 1 Room service appropriate? Yes; Fluid consistency: Thin  Diet effective now                    Inpatient Medications  Scheduled Meds:  arformoterol   15 mcg Nebulization BID   atorvastatin   40 mg Per Tube Daily   budesonide  (PULMICORT ) nebulizer solution  0.5 mg Nebulization BID   Chlorhexidine  Gluconate Cloth  6 each Topical Daily   dextrose   1 ampule Intravenous Once   feeding supplement (PROSource TF20)  60 mL Per Tube BID   fiber  1 packet Per Tube BID   free water   250 mL Per Tube Q4H   heparin  injection (subcutaneous)  5,000 Units Subcutaneous Q8H   insulin  aspart  0-9 Units Subcutaneous TID WC   insulin  glargine  18 Units Subcutaneous QHS   metoprolol  tartrate  12.5 mg Per Tube BID   mouth rinse  15 mL Mouth Rinse 4 times per day   pantoprazole  (PROTONIX ) IV  40 mg Intravenous Q24H   revefenacin   175 mcg Nebulization Daily   tamsulosin   0.4 mg Oral Daily   Continuous Infusions:  sodium chloride  75 mL/hr at 05/28/24 2358   feeding supplement (OSMOLITE 1.2 CAL) Stopped (05/27/24 1455)   PRN Meds:.acetaminophen , albuterol , docusate, fentaNYL  (SUBLIMAZE ) injection, metoprolol  tartrate, mouth rinse, oxyCODONE , polyethylene glycol    Objective:   Vitals:   05/29/24 0200 05/29/24 0400 05/29/24 0729 05/29/24 0731  BP:  (!) 152/69  Pulse: 71 72    Resp: 15 18    Temp:  97.9 F (36.6 C)    TempSrc:  Oral    SpO2: 99% 99% 99% 99%  Weight:      Height:        Wt Readings from Last 3 Encounters:  05/28/24 57.6 kg  10/28/23 70.3 kg  10/14/23 71.2 kg     Intake/Output Summary (Last 24 hours) at 05/29/2024 1002 Last data filed at 05/28/2024 1644 Gross per 24 hour  Intake 436.1 ml  Output 1300 ml  Net -863.9 ml     Physical Exam  Frail elderly male,  awake Alert, No new F.N deficits,   [place].AT,PERRAL Supple Neck, No JVD,   Symmetrical Chest wall movement, Good air movement bilaterally, CTAB RRR,No Gallops,Rubs or new Murmurs,  +ve B.Sounds, Abd Soft, No tenderness,   No Cyanosis, Foley catheter in place, right AKA       Data Review:    Recent Labs  Lab 05/25/24 0008 05/26/24 0456 05/27/24 0418 05/28/24 0602 05/29/24 0615  WBC 10.8* 12.9* 12.0* 11.6* 10.6*  HGB 10.8* 11.5* 11.7* 12.3* 11.0*  HCT 35.1* 37.6* 37.5* 40.3 35.8*  PLT 172 227 216 242 224  MCV 88.9 87.2 88.7 87.0 87.5  MCH 27.3 26.7 27.7 26.6 26.9  MCHC 30.8 30.6 31.2 30.5 30.7  RDW 15.6* 15.6* 15.9* 16.0* 16.2*  LYMPHSABS  --   --   --  0.6* 0.8  MONOABS  --   --   --  0.5 0.6  EOSABS  --   --   --  0.2 0.3  BASOSABS  --   --   --  0.1 0.0    Recent Labs  Lab 05/25/24 0008 05/26/24 0456 05/27/24 0418 05/28/24 0601 05/28/24 0602 05/29/24 0615  NA 137 145 151*  --  149* 144  K 5.0 4.2 4.5  --  4.3 4.5  CL 99 103 105  --  101 105  CO2 28 30 36*  --  34* 32  ANIONGAP 10 12 10   --  14 7  GLUCOSE 187* 138* 94  --  131* 79  BUN 108* 107* 106*  --  97* 92*  CREATININE 2.42* 2.24* 2.15*  --  1.94* 1.75*  AST  --   --   --   --   --  18  ALT  --   --   --   --   --  18  ALKPHOS  --   --   --   --   --  50  BILITOT  --   --   --   --   --  0.9  ALBUMIN  --  2.0*  --   --   --  1.9*  CRP  --   --   --   --  3.2* 2.4*  PROCALCITON  --   --   --   --  0.40 0.43  INR  --   --   --  1.0  --   --   MG  --  2.2 2.2  --  2.3 2.0  PHOS  --  5.4* 6.2*  --   --  5.4*  CALCIUM  7.8* 8.4* 8.3*  --  8.7* 8.4*      Recent Labs  Lab 05/25/24 0008 05/26/24 0456 05/27/24 0418 05/28/24 0601 05/28/24 0602 05/29/24 0615  CRP  --   --   --   --  3.2* 2.4*  PROCALCITON  --   --   --   --  0.40 0.43  INR  --   --   --  1.0  --   --   MG  --  2.2 2.2  --  2.3 2.0  CALCIUM  7.8* 8.4* 8.3*  --  8.7* 8.4*     --------------------------------------------------------------------------------------------------------------- Lab Results  Component Value Date   CHOL 82 05/12/2024   HDL 27 (L) 05/12/2024   LDLCALC 39 05/12/2024   TRIG 61 05/25/2024   CHOLHDL 3.0 05/12/2024    Lab Results  Component Value Date   HGBA1C 5.7 (H) 09/23/2023   No results for input(s): TSH, T4TOTAL, FREET4, T3FREE, THYROIDAB in the last 72 hours. No results for input(s): VITAMINB12, FOLATE, FERRITIN, TIBC, IRON , RETICCTPCT in the last 72 hours. ------------------------------------------------------------------------------------------------------------------ Cardiac Enzymes No results for input(s): CKMB, TROPONINI, MYOGLOBIN in the last 168 hours.  Invalid input(s): CK  Micro Results No results found for this or any previous visit (from the past 240 hours).   Radiology Report DG Chest Port 1 View Result Date: 05/28/2024 EXAM: 1 VIEW XRAY OF THE CHEST 05/28/2024 07:01:00 AM COMPARISON: 05/27/2024 CLINICAL HISTORY: 141880 SOB (shortness of breath) 141880. Reason for exam: pt order states SOB (shortness of breath) SOB (shortness of breath) FINDINGS: LUNGS AND PLEURA: Chronic parenchymal disease and left upper lung opacities. No acute findings. HEART AND MEDIASTINUM: No acute abnormality of the cardiac and mediastinal silhouettes. Aortic atherosclerosis (ICD10-I70.0). BONES AND SOFT TISSUES: No acute osseous abnormality. Feeding tube extends at least as far as stomach, type not seen. IMPRESSION: 1. No acute findings. 2. Chronic parenchymal disease and left upper lung opacities. Electronically signed by: Katheleen Faes MD 05/28/2024 07:28 AM EDT RP Workstation: [id number]     Signature  -   Lavada Stank M.D on 05/29/2024 at 10:02 AM   -  To page go to [email or web address]

## 2024-05-30 ENCOUNTER — Inpatient Hospital Stay (HOSPITAL_COMMUNITY)

## 2024-05-30 DIAGNOSIS — J9602 Acute respiratory failure with hypercapnia: Secondary | ICD-10-CM | POA: Diagnosis not present

## 2024-05-30 DIAGNOSIS — R0602 Shortness of breath: Secondary | ICD-10-CM | POA: Diagnosis not present

## 2024-05-30 DIAGNOSIS — R918 Other nonspecific abnormal finding of lung field: Secondary | ICD-10-CM | POA: Diagnosis not present

## 2024-05-30 LAB — COMPREHENSIVE METABOLIC PANEL WITH GFR
ALT: 23 U/L (ref 0–44)
AST: 23 U/L (ref 15–41)
Albumin: 2.2 g/dL — ABNORMAL LOW (ref 3.5–5.0)
Alkaline Phosphatase: 62 U/L (ref 38–126)
Anion gap: 9 (ref 5–15)
BUN: 88 mg/dL — ABNORMAL HIGH (ref 8–23)
CO2: 30 mmol/L (ref 22–32)
Calcium: 8.7 mg/dL — ABNORMAL LOW (ref 8.9–10.3)
Chloride: 105 mmol/L (ref 98–111)
Creatinine, Ser: 1.79 mg/dL — ABNORMAL HIGH (ref 0.61–1.24)
GFR, Estimated: 39 mL/min — ABNORMAL LOW (ref >60)
Glucose, Bld: 93 mg/dL (ref 70–99)
Potassium: 4.4 mmol/L (ref 3.5–5.1)
Sodium: 144 mmol/L (ref 135–145)
Total Bilirubin: 0.7 mg/dL (ref 0.0–1.2)
Total Protein: 6.6 g/dL (ref 6.5–8.1)

## 2024-05-30 LAB — CBC WITH DIFFERENTIAL/PLATELET
Abs Immature Granulocytes: 0.06 K/uL (ref 0.00–0.07)
Basophils Absolute: 0.1 K/uL (ref 0.0–0.1)
Basophils Relative: 0 %
Eosinophils Absolute: 0.2 K/uL (ref 0.0–0.5)
Eosinophils Relative: 1 %
HCT: 38.7 % — ABNORMAL LOW (ref 39.0–52.0)
Hemoglobin: 11.5 g/dL — ABNORMAL LOW (ref 13.0–17.0)
Immature Granulocytes: 1 %
Lymphocytes Relative: 4 %
Lymphs Abs: 0.5 K/uL — ABNORMAL LOW (ref 0.7–4.0)
MCH: 26.6 pg (ref 26.0–34.0)
MCHC: 29.7 g/dL — ABNORMAL LOW (ref 30.0–36.0)
MCV: 89.4 fL (ref 80.0–100.0)
Monocytes Absolute: 0.6 K/uL (ref 0.1–1.0)
Monocytes Relative: 5 %
Neutro Abs: 10.8 K/uL — ABNORMAL HIGH (ref 1.7–7.7)
Neutrophils Relative %: 89 %
Platelets: 225 K/uL (ref 150–400)
RBC: 4.33 MIL/uL (ref 4.22–5.81)
RDW: 16.2 % — ABNORMAL HIGH (ref 11.5–15.5)
WBC: 12.2 K/uL — ABNORMAL HIGH (ref 4.0–10.5)
nRBC: 0 % (ref 0.0–0.2)

## 2024-05-30 LAB — GLUCOSE, CAPILLARY
Glucose-Capillary: 27 mg/dL — CL (ref 70–99)
Glucose-Capillary: 66 mg/dL — ABNORMAL LOW (ref 70–99)
Glucose-Capillary: 76 mg/dL (ref 70–99)
Glucose-Capillary: 94 mg/dL (ref 70–99)
Glucose-Capillary: 149 mg/dL — ABNORMAL HIGH (ref 70–99)
Glucose-Capillary: 93 mg/dL (ref 70–99)
Glucose-Capillary: 142 mg/dL — ABNORMAL HIGH (ref 70–99)
Glucose-Capillary: 100 mg/dL — ABNORMAL HIGH (ref 70–99)

## 2024-05-30 LAB — BLOOD GAS, ARTERIAL
Acid-Base Excess: 3.6 mmol/L — ABNORMAL HIGH (ref 0.0–2.0)
Bicarbonate: 31.7 mmol/L — ABNORMAL HIGH (ref 20.0–28.0)
O2 Saturation: 98.2 %
Patient temperature: 36.2
pCO2 arterial: 61 mmHg — ABNORMAL HIGH (ref 32–48)
pH, Arterial: 7.32 — ABNORMAL LOW (ref 7.35–7.45)
pO2, Arterial: 99 mmHg (ref 83–108)

## 2024-05-30 LAB — PROCALCITONIN: Procalcitonin: 0.46 ng/mL

## 2024-05-30 LAB — PHOSPHORUS: Phosphorus: 4.6 mg/dL (ref 2.5–4.6)

## 2024-05-30 LAB — HEMOGLOBIN A1C

## 2024-05-30 LAB — C-REACTIVE PROTEIN: CRP: 3.2 mg/dL — ABNORMAL HIGH (ref <1.0)

## 2024-05-30 LAB — MAGNESIUM: Magnesium: 2 mg/dL (ref 1.7–2.4)

## 2024-05-30 MED ORDER — DOXAZOSIN MESYLATE 4 MG PO TABS
4.0000 mg | ORAL_TABLET | Freq: Every day | ORAL | Status: DC
  Administered 2024-05-31 – 2024-06-09 (×9): 4 mg via ORAL
  Filled 2024-05-30 (×10): qty 1

## 2024-05-30 MED ORDER — HALOPERIDOL LACTATE 5 MG/ML IJ SOLN: 2.0000 mg | Freq: Four times a day (QID) | INTRAMUSCULAR | Status: DC | PRN

## 2024-05-30 MED ORDER — QUETIAPINE FUMARATE 25 MG PO TABS
25.0000 mg | ORAL_TABLET | Freq: Every day | ORAL | Status: DC
  Administered 2024-05-30 – 2024-06-08 (×10): 25 mg via ORAL
  Filled 2024-05-30 (×11): qty 1

## 2024-05-30 MED ORDER — INSULIN GLARGINE 100 UNIT/ML ~~LOC~~ SOLN
15.0000 [IU] | Freq: Every day | SUBCUTANEOUS | Status: DC
  Administered 2024-05-30: 15 [IU] via SUBCUTANEOUS
  Filled 2024-05-30 (×2): qty 0.15

## 2024-05-30 NOTE — Plan of Care (Signed)
  Problem: Activity: Goal: Ability to tolerate increased activity will improve Outcome: Progressing   Problem: Respiratory: Goal: Ability to maintain a clear airway and adequate ventilation will improve Outcome: Progressing   Problem: Role Relationship: Goal: Method of communication will improve Outcome: Progressing   Problem: Education: Goal: Knowledge of General Education information will improve Description: Including pain rating scale, medication(s)/side effects and non-pharmacologic comfort measures Outcome: Progressing   Problem: Health Behavior/Discharge Planning: Goal: Ability to manage health-related needs will improve Outcome: Progressing   Problem: Clinical Measurements: Goal: Ability to maintain clinical measurements within normal limits will improve Outcome: Progressing Goal: Will remain free from infection Outcome: Progressing Goal: Diagnostic test results will improve Outcome: Progressing Goal: Respiratory complications will improve Outcome: Progressing Goal: Cardiovascular complication will be avoided Outcome: Progressing   Problem: Activity: Goal: Risk for activity intolerance will decrease Outcome: Progressing   Problem: Nutrition: Goal: Adequate nutrition will be maintained Outcome: Progressing   Problem: Coping: Goal: Level of anxiety will decrease Outcome: Progressing   Problem: Elimination: Goal: Will not experience complications related to bowel motility Outcome: Progressing Goal: Will not experience complications related to urinary retention Outcome: Progressing   Problem: Pain Managment: Goal: General experience of comfort will improve and/or be controlled Outcome: Progressing   Problem: Safety: Goal: Ability to remain free from injury will improve Outcome: Progressing   Problem: Skin Integrity: Goal: Risk for impaired skin integrity will decrease Outcome: Progressing   Problem: Safety: Goal: Non-violent Restraint(s) Outcome:  Progressing   Problem: Education: Goal: Ability to describe self-care measures that may prevent or decrease complications (Diabetes Survival Skills Education) will improve Outcome: Progressing Goal: Individualized Educational Video(s) Outcome: Progressing   Problem: Coping: Goal: Ability to adjust to condition or change in health will improve Outcome: Progressing   Problem: Fluid Volume: Goal: Ability to maintain a balanced intake and output will improve Outcome: Progressing   Problem: Health Behavior/Discharge Planning: Goal: Ability to identify and utilize available resources and services will improve Outcome: Progressing Goal: Ability to manage health-related needs will improve Outcome: Progressing   Problem: Metabolic: Goal: Ability to maintain appropriate glucose levels will improve Outcome: Progressing   Problem: Nutritional: Goal: Maintenance of adequate nutrition will improve Outcome: Progressing Goal: Progress toward achieving an optimal weight will improve Outcome: Progressing   Problem: Skin Integrity: Goal: Risk for impaired skin integrity will decrease Outcome: Progressing   Problem: Tissue Perfusion: Goal: Adequacy of tissue perfusion will improve Outcome: Progressing

## 2024-05-30 NOTE — Progress Notes (Signed)
 PROGRESS NOTE                                                                                                                                                                                                             Patient Demographics:    Clarence Maldonado, is a 78 y.o. male, DOB - [birth year], FMW:[id number]  Outpatient Primary MD for the patient is Gil Greig BRAVO, NP    LOS - 18  Admit date - 05/12/2024    Chief Complaint  Patient presents with   Respiratory Distress       Brief Narrative (HPI from H&P)    78 year old man who presented to [facility] 8/8 for acute respiratory failure. PMHx significant for HTN, HLD, COPD, CKD 3b, DMT2, BPH, R AKA, anxiety/depression.   Patient resides at rehab facility. Having SOB on 8/8 and prescribed rocephin /azithro. Worsening SOB. EMS called found in severe resp distress unresponsive. LMA placed and being bagged en route to [facility] ED. Wheezing b/l. Patient intubated on arrival, he was kept in ICU, his workup was suggestive of metapneumovirus pneumonia, sepsis, he was further diagnosed with perineal abscess, drained by urology.  He was seen by PCCM, urology, ID.  He was stabilized and transferred to my care on 05/28/2024 on day 16 of his hospital stay.    Still has a NG tube, still appears extremely weak dehydrated and deconditioned, extremely poor cough reflex.    Significant Hospital Events:    8/8 - Admit w/ COPD, HMPV infection, intubated. 8/13 - Weaning on vent, PSV 10/5, mild tachypnea. Remains on Precedex . 8/15 Developed perineal abscess.  Underwent cystoscopy and I&D with purulent drainage, wound packed 8/16 Failing PSV, on low dose levoped 8/19: failed SBT, off vasopressors. Treated for hyperkalemia 8/20: tol PS wean  05/25/2024 extubating 05/28/2024.  Transferred to [facility]     Subjective:   Patient in bed, appears comfortable, denies any headache, no fever, no chest pain or  pressure, no shortness of breath , no abdominal pain. No focal weakness.   Assessment  & Plan :    Sepsis upon admission due to combination of metapneumovirus and Pseudomonas pneumonia, perineal abscess with Candida glabrata - he is initially admitted to ICU and intubated, seen by urology, ID.  He underwent incision and drainage by urology on 05/19/2024.  Currently he has finished his antibiotics &  micafungin , urology assisting with wound care for his perineal abscess, still quite weak and deconditioned monitor closely.  Metapneumovirus pneumonia, weak cough reflex, at risk for aspiration, dysphagia.  Currently on tube feeds along with free water  flushes, cough reflex quite poor, have requested speech therapy to evaluate, okay remains at risk for aspiration pneumonia monitor closely.  Acute encephalopathy  Due to above and mild hypercapnia.  No headache or focal deficits, as needed Haldol , nighttime Seroquel , BiPAP for a few hours.  Discussed with pulmonary Dr. Claudene on 05/30/2024.  Continue to monitor.    Severe dehydration with AKI - CKD 3B and hypernatremia.  AKI gradually improving, continue IV fluids and free water  flushes baseline creatinine appears to be close to 2   Dysphagia due to above.  In by speech therapy NG tube discontinued on 05/28/2024 evening, now on oral diet dysphagia 1.  Monitor  Chronic diastolic CHF.  EF around 70% this admission on 05/14/2024.  Currently compensated.  Continue beta-blocker.    BPH - cont foley, will order for his home flomax  to start 8/24   HTN  -BID metop, PRN hydral   DM2  -lantus  25 u at bedtime + SSI,  dose adjusted 05/30/2024.    Lab Results  Component Value Date   HGBA1C See Scanned report in [place] Link 05/12/2024   CBG (last 3)  Recent Labs    05/30/24 0506 05/30/24 0542 05/30/24 0745  GLUCAP 76 94 149*           Condition - Extremely Guarded  Family Communication  :   Alyse Rush [phone] updated on 05/30/2024     Daughter [name] [phone]  on 05/28/2024 at 11 AM, message left again on 05/30/2024 at 11:20 AM.   Code Status : Full code   Consults  : PCCM, ID, urology  PUD Prophylaxis : PPI   Procedures  :            Disposition Plan  :    Status is: Inpatient   DVT Prophylaxis  :    heparin  injection 5,000 Units Start: 05/20/24 0600 SCDs Start: 05/12/24 1851   Lab Results  Component Value Date   PLT 225 05/30/2024    Diet :  Diet Order             DIET - DYS 1 Room service appropriate? Yes; Fluid consistency: Thin  Diet effective now                    Inpatient Medications  Scheduled Meds:  arformoterol   15 mcg Nebulization BID   atorvastatin   40 mg Per Tube Daily   budesonide  (PULMICORT ) nebulizer solution  0.5 mg Nebulization BID   Chlorhexidine  Gluconate Cloth  6 each Topical Daily   dextrose   1 ampule Intravenous Once   feeding supplement (PROSource TF20)  60 mL Per Tube BID   heparin  injection (subcutaneous)  5,000 Units Subcutaneous Q8H   insulin  aspart  0-9 Units Subcutaneous TID WC   insulin  glargine  15 Units Subcutaneous QHS   metoprolol  tartrate  12.5 mg Per Tube BID   omeprazole   40 mg Per Tube Daily   mouth rinse  15 mL Mouth Rinse 4 times per day   revefenacin   175 mcg Nebulization Daily   tamsulosin   0.4 mg Oral Daily   Continuous Infusions:  feeding supplement (OSMOLITE 1.2 CAL) Stopped (05/27/24 1455)   PRN Meds:.acetaminophen , albuterol , docusate, fentaNYL  (SUBLIMAZE ) injection, metoprolol  tartrate, mouth rinse, oxyCODONE , polyethylene glycol  Objective:   Vitals:   05/30/24 0805 05/30/24 0856 05/30/24 0920 05/30/24 1115  BP:      Pulse: 84 (!) 102 78 81  Resp: 20 (!) 26 17 18   Temp:      TempSrc:      SpO2: 98% 98% 100% 97%  Weight:      Height:        Wt Readings from Last 3 Encounters:  05/28/24 57.6 kg  10/28/23 70.3 kg  10/14/23 71.2 kg     Intake/Output Summary (Last 24 hours) at 05/30/2024 1118 Last data  filed at 05/30/2024 0300 Gross per 24 hour  Intake --  Output 1050 ml  Net -1050 ml     Physical Exam  Frail elderly male, awake but slightly confused this morning, no new F.N deficits,   [place].AT,PERRAL Supple Neck, No JVD,   Symmetrical Chest wall movement, Good air movement bilaterally, CTAB RRR,No Gallops,Rubs or new Murmurs,  +ve B.Sounds, Abd Soft, No tenderness,   No Cyanosis, Foley catheter in place, right AKA       Data Review:    Recent Labs  Lab 05/26/24 0456 05/27/24 0418 05/28/24 0602 05/29/24 0615 05/30/24 0546  WBC 12.9* 12.0* 11.6* 10.6* 12.2*  HGB 11.5* 11.7* 12.3* 11.0* 11.5*  HCT 37.6* 37.5* 40.3 35.8* 38.7*  PLT 227 216 242 224 225  MCV 87.2 88.7 87.0 87.5 89.4  MCH 26.7 27.7 26.6 26.9 26.6  MCHC 30.6 31.2 30.5 30.7 29.7*  RDW 15.6* 15.9* 16.0* 16.2* 16.2*  LYMPHSABS  --   --  0.6* 0.8 0.5*  MONOABS  --   --  0.5 0.6 0.6  EOSABS  --   --  0.2 0.3 0.2  BASOSABS  --   --  0.1 0.0 0.1    Recent Labs  Lab 05/26/24 0456 05/27/24 0418 05/28/24 0601 05/28/24 0602 05/29/24 0615 05/30/24 0546  NA 145 151*  --  149* 144 144  K 4.2 4.5  --  4.3 4.5 4.4  CL 103 105  --  101 105 105  CO2 30 36*  --  34* 32 30  ANIONGAP 12 10  --  14 7 9   GLUCOSE 138* 94  --  131* 79 93  BUN 107* 106*  --  97* 92* 88*  CREATININE 2.24* 2.15*  --  1.94* 1.75* 1.79*  AST  --   --   --   --  18 23  ALT  --   --   --   --  18 23  ALKPHOS  --   --   --   --  50 62  BILITOT  --   --   --   --  0.9 0.7  ALBUMIN 2.0*  --   --   --  1.9* 2.2*  CRP  --   --   --  3.2* 2.4* 3.2*  PROCALCITON  --   --   --  0.40 0.43 0.46  INR  --   --  1.0  --   --   --   MG 2.2 2.2  --  2.3 2.0 2.0  PHOS 5.4* 6.2*  --   --  5.4* 4.6  CALCIUM  8.4* 8.3*  --  8.7* 8.4* 8.7*      Recent Labs  Lab 05/26/24 0456 05/27/24 0418 05/28/24 0601 05/28/24 0602 05/29/24 0615 05/30/24 0546  CRP  --   --   --  3.2* 2.4* 3.2*  PROCALCITON  --   --   --  0.40 0.43 0.46  INR  --   --  1.0  --    --   --   MG 2.2 2.2  --  2.3 2.0 2.0  CALCIUM  8.4* 8.3*  --  8.7* 8.4* 8.7*    --------------------------------------------------------------------------------------------------------------- Lab Results  Component Value Date   CHOL 82 05/12/2024   HDL 27 (L) 05/12/2024   LDLCALC 39 05/12/2024   TRIG 61 05/25/2024   CHOLHDL 3.0 05/12/2024    Lab Results  Component Value Date   HGBA1C See Scanned report in [place] Link 05/12/2024   No results for input(s): TSH, T4TOTAL, FREET4, T3FREE, THYROIDAB in the last 72 hours. No results for input(s): VITAMINB12, FOLATE, FERRITIN, TIBC, IRON , RETICCTPCT in the last 72 hours. ------------------------------------------------------------------------------------------------------------------ Cardiac Enzymes No results for input(s): CKMB, TROPONINI, MYOGLOBIN in the last 168 hours.  Invalid input(s): CK  Micro Results No results found for this or any previous visit (from the past 240 hours).   Radiology Report DG Chest Port 1 View Result Date: 05/30/2024 EXAM: 1 VIEW XRAY OF THE CHEST 05/30/2024 08:03:00 AM COMPARISON: 05/28/2024 CLINICAL HISTORY: SOB (shortness of breath) FINDINGS: LUNGS AND PLEURA: Emphysematous changes. Chronic interstitial changes. Stable patchy opacities in left upper lung. No pleural effusion. No pneumothorax. HEART AND MEDIASTINUM: Atherosclerotic calcifications of aortic arch. No acute abnormality of the cardiac and mediastinal silhouettes. BONES AND SOFT TISSUES: No acute osseous abnormality. IMPRESSION: 1. Stable patchy opacities in the left upper lung. 2. Emphysematous changes and chronic interstitial changes. Electronically signed by: Waddell Calk MD 05/30/2024 08:34 AM EDT RP Workstation: [id number]     Signature  -   Lavada Stank M.D on 05/30/2024 at 11:18 AM   -  To page go to [email or web address]

## 2024-05-30 NOTE — Progress Notes (Signed)
   11 Days Post-Op Subjective: Patient looks significantly better today.  He was alert and oriented, able to participate in conversation.  Objective: Vital signs in last 24 hours: Temp:  [96.2 F (35.7 C)-98.9 F (37.2 C)] 96.2 F (35.7 C) (08/26 1157) Pulse Rate:  [63-106] 81 (08/26 1115) Resp:  [14-26] 18 (08/26 1115) BP: (134-155)/(65-80) 147/69 (08/26 1157) SpO2:  [92 %-100 %] 97 % (08/26 1115) FiO2 (%):  [40 %] 40 % (08/26 0920)  Assessment/Plan:   #Perineal abscess  I&D with Dr. Selma on 05/19/2024.  Open scrotal wound with very deep track approximately 6+ inches towards pubis.  Thankfully his nurse is a former wound care nurse and has been doing an excellent job trying to keep his wound dressing clean.  She reports narrowing of the tunnels to the point that finger placement of gauze is no longer possible.  He continues to soil his dressings daily and Mepilex has been utilized to keep opening free of stool.  Edges clean and healthy.  Continue daily wet-to-dry dressings.  Urology will follow  Intake/Output from previous day: 08/25 0701 - 08/26 0700 In: -  Out: 1050 [Urine:1050]  Intake/Output this shift: No intake/output data recorded.  Physical Exam:  General: Intubated/sedated CV: No cyanosis Lungs: Ventilator dependent Abdomen: Soft, NTND, no rebound or guarding Gu: 68F Foley catheter in place draining clear yellow urine.  Lab Results: Recent Labs    05/28/24 0602 05/29/24 0615 05/30/24 0546  HGB 12.3* 11.0* 11.5*  HCT 40.3 35.8* 38.7*   BMET Recent Labs    05/29/24 0615 05/30/24 0546  NA 144 144  K 4.5 4.4  CL 105 105  CO2 32 30  GLUCOSE 79 93  BUN 92* 88*  CREATININE 1.75* 1.79*  CALCIUM  8.4* 8.7*  HGB 11.0* 11.5*  WBC 10.6* 12.2*     Studies/Results: DG Chest Port 1 View Result Date: 05/30/2024 EXAM: 1 VIEW XRAY OF THE CHEST 05/30/2024 08:03:00 AM COMPARISON: 05/28/2024 CLINICAL HISTORY: SOB (shortness of breath) FINDINGS: LUNGS AND  PLEURA: Emphysematous changes. Chronic interstitial changes. Stable patchy opacities in left upper lung. No pleural effusion. No pneumothorax. HEART AND MEDIASTINUM: Atherosclerotic calcifications of aortic arch. No acute abnormality of the cardiac and mediastinal silhouettes. BONES AND SOFT TISSUES: No acute osseous abnormality. IMPRESSION: 1. Stable patchy opacities in the left upper lung. 2. Emphysematous changes and chronic interstitial changes. Electronically signed by: Waddell Calk MD 05/30/2024 08:34 AM EDT RP Workstation: [id number]      LOS: 18 days   Ole Bourdon, NP [facility] Pager: [phone]  05/30/2024, 1:52 PM

## 2024-05-30 NOTE — Progress Notes (Addendum)
 Speech Language Pathology Treatment: Dysphagia  Patient Details Name: Clarence Maldonado MRN: [id number] DOB: 04-24-1946 Today's Date: 05/30/2024 Time: 8851-8769 SLP Time Calculation (min) (ACUTE ONLY): 42 min  Assessment / Plan / Recommendation Clinical Impression  Patient was in bed when SLP arrived. Patient requested cold water  and chocolate ice cream when asked for preference on POs. The patient was expressing that he, feels off. When asked to expand on this he shared that he feels weak and has never felt like this before. The patient acknowledged the staff helping him improve and remained sad and discouraged throughout the session on his health status. PT and Mobility Specialist came in during session to move patient to his chair. When in chair, the patient had difficulty feeding himself the chocolate ice cream. The patient would move the cup of ice cream up to his mouth instead of the spoonful of ice cream in his other hand. With assistance, the patient could eat off the spoon. To help support the patient, SLP gathered ensure chocolate milk to make a chocolate milkshare in a cup. The patient was successful in drinking the milkshake with a straw in his chair. SLP left patient in his chair with milkshake and water  on his table. SLP will modify progress on Dys 1 and his ability to advance with solid textures.   HPI HPI: Patient is a 78 y.o. male with PMH: COPD, CDK 3b, DMT2, HTN, HLD, BPH, R AKA, anxiety/depression. During 2020 hospitalization he required SLP assessment and intervention due to dysphagia but did advance to regular solids, thin liquids with no f/u warranted. No documented dysphagia intervention since 2020 seen per chart review. Patient presented to the hospital on 05/12/2024 from SNF with worsening SOB. When EMS arrived he was in severe respiratory distress and unresponsive. Patient intubated on arrival, he was kept in ICU, his workup was suggestive of metapneumovirus pneumonia, sepsis, he  was further diagnosed with perineal abscess, drained by urology.  He was extubated on 05/25/24. He passed [facility] swallow screen with RN on 05/27/24 and has been on full liquids diet. Cortrak feeding tube is in place. SLP swallow evaluation ordered on 05/28/2024 to assess his swallow function as he has a weak cough, is dehydrated, has nasal trumpet in place for suctioning.      SLP Plan             Recommendations  Diet recommendations: Dysphagia 1 (puree);Thin liquid Liquids provided via: Straw Medication Administration: Crushed with puree Supervision: Staff to assist with self feeding Compensations: Slow rate;Small sips/bites;Minimize environmental distractions                  Oral care BID;Staff/trained caregiver to provide oral care     Dysphagia, unspecified (R13.10)           Damien Hy  Graduate SLP Clinican

## 2024-05-30 NOTE — TOC Progression Note (Signed)
 Transition of Care [facility]) - Progression Note    Patient Details  Name: Clarence Maldonado MRN: [id number] Date of Birth: April 02, 1946  Transition of Care [facility]) CM/SW Contact  Inocente GORMAN Kindle, LCSW Phone Number: 05/30/2024, 1:30 PM  Clinical Narrative:    CSW continuing to follow for medical stability to return to [facility].    Expected Discharge Plan: Long Term Nursing Home (From [facility]) Barriers to Discharge: Continued Medical Work up               Expected Discharge Plan and Services In-house Referral: Clinical Social Work     Living arrangements for the past 2 months: Skilled Nursing Facility                                       Social Drivers of Health (SDOH) Interventions SDOH Screenings   Food Insecurity: No Food Insecurity (10/20/2023)  Housing: Unknown (10/20/2023)  Transportation Needs: No Transportation Needs (10/20/2023)  Utilities: Not At Risk (10/20/2023)  Depression (PHQ2-9): Low Risk  (10/14/2023)  Financial Resource Strain: Low Risk  (03/23/2019)  Physical Activity: Inactive (03/23/2019)  Social Connections: Socially Isolated (03/23/2019)  Stress: No Stress Concern Present (03/23/2019)  Tobacco Use: Medium Risk (05/19/2024)    Readmission Risk Interventions     No data to display

## 2024-05-30 NOTE — Progress Notes (Incomplete)
 NAME:  Clarence Maldonado, MRN:  [id number], DOB:  13-Apr-1946, LOS: 18 ADMISSION DATE:  05/12/2024, CONSULTATION DATE:  05/12/2024 REFERRING MD:  Cottie - EDP, CHIEF COMPLAINT:  Acute respiratory failure  History of Present Illness:  78 year old man who presented to [facility] 8/8 for acute respiratory failure. PMHx significant for HTN, HLD, COPD, CKD 3b, DMT2, BPH, R AKA, anxiety/depression.  Patient resides at rehab facility. Having SOB on 8/8 and prescribed rocephin /azithro. Worsening SOB. EMS called found in severe resp distress unresponsive. LMA placed and being bagged en route to [facility] ED. Wheezing b/l. Patient intubated on arrival. BP became soft post intubation requiring Levo. VBG 7.22/64/41/26. Labs pending. Given nebs, IV steroids, Mg. PCCM consulted for ICU admission.  Prior Echo in 2021 with EF of 30 to 35%. History of COPD. On triple therapy. No recent hospitalization for COPD.  Pertinent  Medical History   Past Medical History:  Diagnosis Date   Abnormal CT of the chest 2009   nodule 9 x 6 mm RUL   Benign localized hyperplasia of prostate with urinary obstruction and other lower urinary tract symptoms (LUTS)(600.21)    Cardiomegaly    Cerebrovascular accident (stroke) (HCC) 2009   lacunar infarct in the left thalamus   Cholelithiasis    Chronic airway obstruction, not elsewhere classified    Chronic kidney disease, stage III (moderate) (HCC)    Closed femur fracture (HCC) 1960   Contact with or exposure to viral disease    suspected    Illiterate    Obesity, unspecified    Other and unspecified hyperlipidemia    Special screening examination for respiratory tuberculosis    Tobacco use disorder    Tuberculosis    Type II or unspecified type diabetes mellitus with renal manifestations, uncontrolled(250.42)    Unspecified essential hypertension    Significant Hospital Events: Including procedures, antibiotic start and stop dates in addition to other pertinent events   8/8 - Admit  w/ COPD, HMPV infection, intubated. 8/13 - Weaning on vent, PSV 10/5, mild tachypnea. Remains on Precedex . 8/15 Developed perineal abscess.  Underwent cystoscopy and I&D with purulent drainage, wound packed 8/16 Failing PSV, on low dose levoped 8/19: failed SBT, off vasopressors. Treated for hyperkalemia 8/20: tol PS wean  05/25/2024 extubating 8/26 secretion management issues, ongoing delirium  Interim History / Subjective:  Remains tenuous Now intermittent BIPAP and NTS  Objective:   Blood pressure (!) 151/67, pulse (!) 102, temperature (!) 97.2 F (36.2 C), temperature source Oral, resp. rate (!) 26, height 5' 5 (1.651 m), weight 57.6 kg, SpO2 98%.    Vent Mode: PCV;BIPAP FiO2 (%):  [40 %] 40 % Set Rate:  [18 bmp] 18 bmp PEEP:  [5 cmH20] 5 cmH20   Intake/Output Summary (Last 24 hours) at 05/30/2024 0904 Last data filed at 05/30/2024 0300 Gross per 24 hour  Intake --  Output 1050 ml  Net -1050 ml   Filed Weights   05/23/24 0500 05/25/24 0419 05/28/24 0500  Weight: 65.4 kg 64.9 kg 57.6 kg   Physical Examination: General: acutely and chronically ill M NAD Neuro: awake, oriented x2 and will follow some commands HEENT: Cortrak. NCAT  Pulm: scattered wet sounds upper fields, symmetrical chest expansion, even and unlabored CV: rr cap refill < 3 sec MSK: R AKA  GU: foley    Resolved Problem List:  Likely sedation induced hypotension Acute respiratory failure w hypercarbia  AECOPD   Assessment and Plan:   Acute encephalopathy  P -delirium precautions  Metapneumovirus PNA HCAP -- pseudomonas PNA (cx on 8/11) -extubated 8/21  P: -cont aggressive pulm hygiene measures including NTS as needed  -completed zosyn    Sepsis due to perineal abscess with candida glabrata, improving HCAP above CT Pelvis 8/14 with 9.4 x 3.5 x 6.8cm fluid collection in inferior R perineum extending to the level of the testicles c/f abscess with diffuse scrotal wall edema and occlusion  of the visualized R SFA. -s/p I&D cysto 8/15 P -completed zosyn  -on micafungin   -uro following, appreciate recs  -keep foley -will reach out to uro when he txf out of ICU so they can ensure dressing change needs are appropriately communicated   AKI on CKD 3b Hypernatremia P -cont follow renal indices uop  -incr FWF 8/23   DM2 -lantus  25 u at bedtime + SSI + q4 hr 4u novolog    HTN HLD -BID metop, PRN hydral  -I am stopping his IV lasix  8/23 -- eval daily. Stopping for now as we are incr FWF related to Na  BPH -cont foley -will order for his home flomax  to start 8/24  Severe malnutrition, POA -EN   Dispo: -adequate airway protection, improving mentation. Weak overall but do think stable to txf out of ICU 8/23   Best Practice (right click and Reselect all SmartList Selections daily)   Diet/type: tubefeeds + full lliquid DVT prophylaxis prophylactic heparin   Pressure ulcer(s): N/A GI prophylaxis: H2B Lines: N/A Foley:  Yes, and it is still needed Code Status:  full code Last date of multidisciplinary goals of care discussion [8/20 friend Norleen updated over phone] He does not have HCPOA paperwork. He did mention that patient does have a daughter that is not involved in the patient's life- no contact information available for her.  Daughter, [name] [phone] - initially made contact and was agreeable to assist with decision making per SW but unable to reach her 8/20, will try to reach her 05/26/2024  Addendum - attempted to contact pt's daughter tracy. Called x3. Left VM.   CCT na  High MDM   Ronnald Gave MSN, AGACNP-BC [facility] Pulmonary/Critical Care Medicine Amion for pager  05/30/2024, 9:04 AM

## 2024-05-30 NOTE — Progress Notes (Signed)
 RT suctioned back of throat with suction catheter, moderate amount of tan secretions suctioned out.

## 2024-05-30 NOTE — Progress Notes (Signed)
 Physical Therapy Treatment Patient Details Name: Clarence Maldonado MRN: [id number] DOB: December 19, 1945 Today's Date: 05/30/2024   History of Present Illness 78 year old man who presented via EMS 8/8 for acute respiratory failure. EMS found in severe resp distress unresponsive. Pt intubated on arrival to [facility] and taken to ICU. Admitted for treatment of COPD, and HMPV infection. Developed perineal abscess, 8/15 underwent cystoscopy with I&D. Extubated 8/21  PMHx significant for HTN, HLD, COPD, CKD 3b, DMT2, BPH, R AKA, anxiety/depression.    PT Comments  Pt tolerated treatment well today. Pt today was able to transfer to chair with +2 Max A via stand pivot HHA. No change in DC/DME recs at this time. PT will continue to follow.     If plan is discharge home, recommend the following: A lot of help with walking and/or transfers;A lot of help with bathing/dressing/bathroom;Assistance with cooking/housework;Direct supervision/assist for medications management;Direct supervision/assist for financial management;Assist for transportation;Help with stairs or ramp for entrance;Supervision due to cognitive status   Can travel by private vehicle     No  Equipment Recommendations  Rolling walker (2 wheels)    Recommendations for Other Services       Precautions / Restrictions Precautions Precautions: Fall Recall of Precautions/Restrictions: Impaired Restrictions Weight Bearing Restrictions Per Provider Order: No     Mobility  Bed Mobility Overal bed mobility: Needs Assistance Bed Mobility: Supine to Sit     Supine to sit: +2 for physical assistance, Max assist     General bed mobility comments: +2 Max A via helicopter method. Pt with heavy L lateral lean once seated EOB.    Transfers Overall transfer level: Needs assistance Equipment used: 2 person hand held assist Transfers: Sit to/from Stand, Bed to chair/wheelchair/BSC Sit to Stand: +2 physical assistance, Max assist Stand pivot transfers:  +2 physical assistance, Max assist         General transfer comment: +2 Max A via HHA to stand pivot into chair.    Ambulation/Gait               General Gait Details: deferred   Stairs             Wheelchair Mobility     Tilt Bed    Modified Rankin (Stroke Patients Only)       Balance Overall balance assessment: Needs assistance Sitting-balance support: Feet unsupported, Bilateral upper extremity supported Sitting balance-Leahy Scale: Poor   Postural control: Left lateral lean Standing balance support: Bilateral upper extremity supported, During functional activity Standing balance-Leahy Scale: Poor Standing balance comment: Reliant on therapist                            Communication Communication Communication: Impaired Factors Affecting Communication: Difficulty expressing self;Reduced clarity of speech  Cognition Arousal: Alert Behavior During Therapy: Restless, Flat affect   PT - Cognitive impairments: No family/caregiver present to determine baseline, Difficult to assess, Orientation, Awareness, Memory, Sequencing, Problem solving, Attention, Safety/Judgement Difficult to assess due to: Impaired communication                     PT - Cognition Comments: Pt appears to be easi;y distracted by many things. Tends to ramble however was able to follow commands more than previous session. Following commands: Impaired Following commands impaired: Follows one step commands with increased time, Follows one step commands inconsistently    Cueing Cueing Techniques: Verbal cues, Tactile cues, Visual cues  Exercises  General Comments General comments (skin integrity, edema, etc.): VSS      Pertinent Vitals/Pain Pain Assessment Pain Assessment: No/denies pain    Home Living                          Prior Function            PT Goals (current goals can now be found in the care plan section) Progress towards PT  goals: Progressing toward goals    Frequency    Min 2X/week      PT Plan      Co-evaluation              AM-PAC PT 6 Clicks Mobility   Outcome Measure  Help needed turning from your back to your side while in a flat bed without using bedrails?: A Lot Help needed moving from lying on your back to sitting on the side of a flat bed without using bedrails?: A Lot Help needed moving to and from a bed to a chair (including a wheelchair)?: A Lot Help needed standing up from a chair using your arms (e.g., wheelchair or bedside chair)?: A Lot Help needed to walk in hospital room?: Total Help needed climbing 3-5 steps with a railing? : Total 6 Click Score: 10    End of Session Equipment Utilized During Treatment: Gait belt;Oxygen  Activity Tolerance: Patient tolerated treatment well Patient left: in chair;with call bell/phone within reach;with chair alarm set;Other (comment) (Handoff to speech) Nurse Communication: Mobility status;Need for lift equipment PT Visit Diagnosis: Difficulty in walking, not elsewhere classified (R26.2);Muscle weakness (generalized) (M62.81);Unsteadiness on feet (R26.81);Other symptoms and signs involving the nervous system (R29.898);Adult, failure to thrive (R62.7)     Time: 8841-8783 PT Time Calculation (min) (ACUTE ONLY): 18 min  Charges:    $Therapeutic Activity: 8-22 mins PT General Charges $$ ACUTE PT VISIT: 1 Visit                     Sueellen NOVAK, PT, DPT Acute Rehab Services [phone]    Janele Lague 05/30/2024, 5:19 PM

## 2024-05-30 NOTE — Progress Notes (Signed)
   05/30/24 1115  Therapy Vitals  Pulse Rate 81  Resp 18  MEWS Score/Color  MEWS Score 0  MEWS Score Color Green  Oxygen  Therapy/Pulse Ox  O2 Device Nasal Cannula  O2 Therapy Oxygen   O2 Flow Rate (L/min) 3 L/min  SpO2 97 %   Went to check pt BiPAP and heard alarming from the hall, when coming into room pt had ripped mask off of BiPAP, unsure how long it was off and alarming. Pt vitals stable. RT placed on 3L [place]

## 2024-05-31 DIAGNOSIS — J9602 Acute respiratory failure with hypercapnia: Secondary | ICD-10-CM | POA: Diagnosis not present

## 2024-05-31 LAB — COMPREHENSIVE METABOLIC PANEL WITH GFR
ALT: 25 U/L (ref 0–44)
AST: 21 U/L (ref 15–41)
Albumin: 2.1 g/dL — ABNORMAL LOW (ref 3.5–5.0)
Alkaline Phosphatase: 63 U/L (ref 38–126)
Anion gap: 10 (ref 5–15)
BUN: 77 mg/dL — ABNORMAL HIGH (ref 8–23)
CO2: 29 mmol/L (ref 22–32)
Calcium: 8.6 mg/dL — ABNORMAL LOW (ref 8.9–10.3)
Chloride: 107 mmol/L (ref 98–111)
Creatinine, Ser: 1.68 mg/dL — ABNORMAL HIGH (ref 0.61–1.24)
GFR, Estimated: 42 mL/min — ABNORMAL LOW (ref >60)
Glucose, Bld: 36 mg/dL — CL (ref 70–99)
Potassium: 4.8 mmol/L (ref 3.5–5.1)
Sodium: 146 mmol/L — ABNORMAL HIGH (ref 135–145)
Total Bilirubin: 0.8 mg/dL (ref 0.0–1.2)
Total Protein: 5.9 g/dL — ABNORMAL LOW (ref 6.5–8.1)

## 2024-05-31 LAB — CBC WITH DIFFERENTIAL/PLATELET
Abs Immature Granulocytes: 0.03 K/uL (ref 0.00–0.07)
Basophils Absolute: 0 K/uL (ref 0.0–0.1)
Basophils Relative: 0 %
Eosinophils Absolute: 0.3 K/uL (ref 0.0–0.5)
Eosinophils Relative: 4 %
HCT: 35.5 % — ABNORMAL LOW (ref 39.0–52.0)
Hemoglobin: 10.8 g/dL — ABNORMAL LOW (ref 13.0–17.0)
Immature Granulocytes: 0 %
Lymphocytes Relative: 11 %
Lymphs Abs: 1 K/uL (ref 0.7–4.0)
MCH: 26.9 pg (ref 26.0–34.0)
MCHC: 30.4 g/dL (ref 30.0–36.0)
MCV: 88.3 fL (ref 80.0–100.0)
Monocytes Absolute: 0.6 K/uL (ref 0.1–1.0)
Monocytes Relative: 7 %
Neutro Abs: 7 K/uL (ref 1.7–7.7)
Neutrophils Relative %: 78 %
Platelets: 182 K/uL (ref 150–400)
RBC: 4.02 MIL/uL — ABNORMAL LOW (ref 4.22–5.81)
RDW: 16.4 % — ABNORMAL HIGH (ref 11.5–15.5)
WBC: 8.9 K/uL (ref 4.0–10.5)
nRBC: 0 % (ref 0.0–0.2)

## 2024-05-31 LAB — GLUCOSE, CAPILLARY
Glucose-Capillary: 128 mg/dL — ABNORMAL HIGH (ref 70–99)
Glucose-Capillary: 139 mg/dL — ABNORMAL HIGH (ref 70–99)
Glucose-Capillary: 162 mg/dL — ABNORMAL HIGH (ref 70–99)
Glucose-Capillary: 26 mg/dL — CL (ref 70–99)
Glucose-Capillary: 28 mg/dL — CL (ref 70–99)
Glucose-Capillary: 76 mg/dL (ref 70–99)
Glucose-Capillary: 82 mg/dL (ref 70–99)
Glucose-Capillary: 83 mg/dL (ref 70–99)
Glucose-Capillary: 91 mg/dL (ref 70–99)

## 2024-05-31 LAB — PHOSPHORUS: Phosphorus: 4.1 mg/dL (ref 2.5–4.6)

## 2024-05-31 LAB — C-REACTIVE PROTEIN: CRP: 5.7 mg/dL — ABNORMAL HIGH (ref <1.0)

## 2024-05-31 LAB — MAGNESIUM: Magnesium: 2.1 mg/dL (ref 1.7–2.4)

## 2024-05-31 LAB — PROCALCITONIN: Procalcitonin: 0.5 ng/mL

## 2024-05-31 MED ORDER — ACETAMINOPHEN 325 MG PO TABS: 650.0000 mg | ORAL_TABLET | ORAL | Status: DC | PRN

## 2024-05-31 MED ORDER — SODIUM CHLORIDE 0.45 % IV SOLN: INTRAVENOUS | Status: DC

## 2024-05-31 MED ORDER — POLYETHYLENE GLYCOL 3350 17 G PO PACK: 17.0000 g | PACK | Freq: Every day | ORAL | Status: DC | PRN

## 2024-05-31 MED ORDER — ENSURE PLUS HIGH PROTEIN PO LIQD
237.0000 mL | Freq: Three times a day (TID) | ORAL | Status: DC
  Administered 2024-05-31 – 2024-06-09 (×25): 237 mL via ORAL

## 2024-05-31 MED ORDER — INSULIN GLARGINE 100 UNIT/ML ~~LOC~~ SOLN: 8.0000 [IU] | Freq: Every day | SUBCUTANEOUS | Status: DC

## 2024-05-31 MED ORDER — DOCUSATE SODIUM 50 MG/5ML PO LIQD: 100.0000 mg | Freq: Two times a day (BID) | ORAL | Status: DC | PRN

## 2024-05-31 MED ORDER — METOPROLOL TARTRATE 12.5 MG HALF TABLET
12.5000 mg | ORAL_TABLET | Freq: Two times a day (BID) | ORAL | Status: AC
  Administered 2024-05-31 – 2024-06-01 (×2): 12.5 mg via ORAL
  Filled 2024-05-31 (×2): qty 1

## 2024-05-31 MED ORDER — PANTOPRAZOLE SODIUM 40 MG PO TBEC
40.0000 mg | DELAYED_RELEASE_TABLET | Freq: Every day | ORAL | Status: DC
  Administered 2024-05-31 – 2024-06-09 (×10): 40 mg via ORAL
  Filled 2024-05-31 (×10): qty 1

## 2024-05-31 MED ORDER — ACETYLCYSTEINE 20 % IN SOLN
4.0000 mL | Freq: Once | RESPIRATORY_TRACT | Status: AC
  Administered 2024-05-31: 4 mL via RESPIRATORY_TRACT
  Filled 2024-05-31: qty 4

## 2024-05-31 MED ORDER — ATORVASTATIN CALCIUM 40 MG PO TABS
40.0000 mg | ORAL_TABLET | Freq: Every day | ORAL | Status: DC
  Administered 2024-06-01 – 2024-06-09 (×9): 40 mg via ORAL
  Filled 2024-05-31 (×9): qty 1

## 2024-05-31 MED ORDER — ADULT MULTIVITAMIN W/MINERALS CH
1.0000 | ORAL_TABLET | Freq: Every day | ORAL | Status: DC
  Administered 2024-05-31 – 2024-06-09 (×10): 1 via ORAL
  Filled 2024-05-31 (×10): qty 1

## 2024-05-31 NOTE — Plan of Care (Signed)
   Problem: Activity: Goal: Ability to tolerate increased activity will improve Outcome: Progressing   Problem: Respiratory: Goal: Ability to maintain a clear airway and adequate ventilation will improve Outcome: Progressing   Problem: Role Relationship: Goal: Method of communication will improve Outcome: Progressing   Problem: Education: Goal: Knowledge of General Education information will improve Description: Including pain rating scale, medication(s)/side effects and non-pharmacologic comfort measures Outcome: Progressing

## 2024-05-31 NOTE — Progress Notes (Signed)
 PROGRESS NOTE                                                                                                                                                                                                             Patient Demographics:    Clarence Maldonado, is a 78 y.o. male, DOB - 19-Dec-1945, FMW:[id number]  Outpatient Primary MD for the patient is Gil Greig BRAVO, NP    LOS - 19  Admit date - 05/12/2024    Chief Complaint  Patient presents with   Respiratory Distress       Brief Narrative (HPI from H&P)    78 year old man who presented to [facility] 8/8 for acute respiratory failure. PMHx significant for HTN, HLD, COPD, CKD 3b, DMT2, BPH, R AKA, anxiety/depression.   Patient resides at rehab facility. Having SOB on 8/8 and prescribed rocephin /azithro. Worsening SOB. EMS called found in severe resp distress unresponsive. LMA placed and being bagged en route to [facility] ED. Wheezing b/l. Patient intubated on arrival, he was kept in ICU, his workup was suggestive of metapneumovirus pneumonia, sepsis, he was further diagnosed with perineal abscess, drained by urology.  He was seen by PCCM, urology, ID.  He was stabilized and transferred to my care on 05/28/2024 on day 16 of his hospital stay.    Still has a NG tube, still appears extremely weak dehydrated and deconditioned, extremely poor cough reflex.    Significant Hospital Events:    8/8 - Admit w/ COPD, HMPV infection, intubated. 8/13 - Weaning on vent, PSV 10/5, mild tachypnea. Remains on Precedex . 8/15 Developed perineal abscess.  Underwent cystoscopy and I&D with purulent drainage, wound packed 8/16 Failing PSV, on low dose levoped 8/19: failed SBT, off vasopressors. Treated for hyperkalemia 8/20: tol PS wean  05/25/2024 extubating 05/28/2024.  Transferred to [facility]     Subjective:   Patient in bed, appears comfortable, denies any headache, no fever, no chest pain or  pressure, no shortness of breath , no abdominal pain. No new focal weakness.    Assessment  & Plan :    Sepsis upon admission due to combination of metapneumovirus and Pseudomonas pneumonia, perineal abscess with Candida glabrata - he is initially admitted to ICU and intubated, seen by urology, ID.  He underwent incision and drainage by urology on 05/19/2024.  Currently he has finished his  antibiotics & micafungin , urology assisting with wound care for his perineal abscess, still quite weak and deconditioned monitor closely.  Metapneumovirus pneumonia, weak cough reflex, at risk for aspiration, dysphagia.  Currently on tube feeds along with free water  flushes, cough reflex quite poor, have requested speech therapy to evaluate, okay remains at risk for aspiration pneumonia monitor closely.  Discussed with RT on 05/31/2024 he will require continued chest PT, NT suction and as needed Mucomyst  as he still has quite a bit of coarse breath sounds due to airway secretions.  I-S and flutter valve also on board.  Acute encephalopathy  Due to above and mild hypercapnia.  No headache or focal deficits, as needed Haldol , nighttime Seroquel , BiPAP for a few hours.  Discussed with pulmonary Dr. Claudene on 05/30/2024.  Continue to monitor.    Severe dehydration with AKI - CKD 3B and hypernatremia.  AKI gradually improving, continue IV fluids and free water  flushes baseline creatinine appears to be close to 2, poor oral intake adjusted IV fluids again on 05/31/2024   Dysphagia due to above.  In by speech therapy NG tube discontinued on 05/28/2024 evening, now on oral diet dysphagia 1.  Monitor  Chronic diastolic CHF.  EF around 70% this admission on 05/14/2024.  Currently compensated.  Continue beta-blocker.    BPH - cont foley, will order for his home flomax  to start 8/24   HTN  -BID metop, PRN hydral   DM2  -oral intake is erratic, discontinue long-acting insulin  completely on 05/31/2024 as he is having episodes of  severe hypoglycemia, sliding scale for now.    Lab Results  Component Value Date   HGBA1C See Scanned report in [place] Link 05/12/2024   CBG (last 3)  Recent Labs    05/31/24 0608 05/31/24 0612 05/31/24 0642  GLUCAP 28* 26* 162*           Condition - Extremely Guarded  Family Communication  :   Alyse Rush [phone] updated on 05/30/2024    Daughter [name] [phone]  on 05/28/2024 at 11 AM, message left again on 05/30/2024 at 11:20 AM.   Code Status : Full code   Consults  : PCCM, ID, urology  PUD Prophylaxis : PPI   Procedures  :            Disposition Plan  :    Status is: Inpatient   DVT Prophylaxis  :    heparin  injection 5,000 Units Start: 05/20/24 0600 SCDs Start: 05/12/24 1851   Lab Results  Component Value Date   PLT 182 05/31/2024    Diet :  Diet Order             DIET - DYS 1 Room service appropriate? Yes; Fluid consistency: Thin  Diet effective now                    Inpatient Medications  Scheduled Meds:  acetylcysteine   4 mL Nebulization Once   arformoterol   15 mcg Nebulization BID   atorvastatin   40 mg Per Tube Daily   budesonide  (PULMICORT ) nebulizer solution  0.5 mg Nebulization BID   Chlorhexidine  Gluconate Cloth  6 each Topical Daily   doxazosin   4 mg Oral Daily   feeding supplement (PROSource TF20)  60 mL Per Tube BID   heparin  injection (subcutaneous)  5,000 Units Subcutaneous Q8H   insulin  aspart  0-9 Units Subcutaneous TID WC   metoprolol  tartrate  12.5 mg Per Tube BID   omeprazole   40 mg  Per Tube Daily   mouth rinse  15 mL Mouth Rinse 4 times per day   QUEtiapine   25 mg Oral QHS   revefenacin   175 mcg Nebulization Daily   Continuous Infusions:  sodium chloride      feeding supplement (OSMOLITE 1.2 CAL) Stopped (05/27/24 1455)   PRN Meds:.acetaminophen , albuterol , docusate, fentaNYL  (SUBLIMAZE ) injection, haloperidol  lactate, metoprolol  tartrate, mouth rinse, oxyCODONE , polyethylene glycol     Objective:   Vitals:   05/31/24 0700 05/31/24 0701 05/31/24 0725 05/31/24 0727  BP: 112/63     Pulse:      Resp:      Temp: 97.7 F (36.5 C)     TempSrc: Oral     SpO2:   96% 96%  Weight:  58 kg    Height:        Wt Readings from Last 3 Encounters:  05/31/24 58 kg  10/28/23 70.3 kg  10/14/23 71.2 kg     Intake/Output Summary (Last 24 hours) at 05/31/2024 0825 Last data filed at 05/31/2024 0707 Gross per 24 hour  Intake --  Output 1950 ml  Net -1950 ml     Physical Exam  Frail elderly male, awake but slightly confused , no new F.N deficits,   [place].AT,PERRAL Supple Neck, No JVD,   Symmetrical Chest wall movement, Good air movement bilaterally, coarse bilateral breath sounds RRR,No Gallops,Rubs or new Murmurs,  +ve B.Sounds, Abd Soft, No tenderness,   No Cyanosis, Foley catheter in place, right AKA       Data Review:    Recent Labs  Lab 05/27/24 0418 05/28/24 0602 05/29/24 0615 05/30/24 0546 05/31/24 0517  WBC 12.0* 11.6* 10.6* 12.2* 8.9  HGB 11.7* 12.3* 11.0* 11.5* 10.8*  HCT 37.5* 40.3 35.8* 38.7* 35.5*  PLT 216 242 224 225 182  MCV 88.7 87.0 87.5 89.4 88.3  MCH 27.7 26.6 26.9 26.6 26.9  MCHC 31.2 30.5 30.7 29.7* 30.4  RDW 15.9* 16.0* 16.2* 16.2* 16.4*  LYMPHSABS  --  0.6* 0.8 0.5* 1.0  MONOABS  --  0.5 0.6 0.6 0.6  EOSABS  --  0.2 0.3 0.2 0.3  BASOSABS  --  0.1 0.0 0.1 0.0    Recent Labs  Lab 05/26/24 0456 05/27/24 0418 05/28/24 0601 05/28/24 0602 05/29/24 0615 05/30/24 0546 05/31/24 0517  NA 145 151*  --  149* 144 144 146*  K 4.2 4.5  --  4.3 4.5 4.4 4.8  CL 103 105  --  101 105 105 107  CO2 30 36*  --  34* 32 30 29  ANIONGAP 12 10  --  14 7 9 10   GLUCOSE 138* 94  --  131* 79 93 36*  BUN 107* 106*  --  97* 92* 88* 77*  CREATININE 2.24* 2.15*  --  1.94* 1.75* 1.79* 1.68*  AST  --   --   --   --  18 23 21   ALT  --   --   --   --  18 23 25   ALKPHOS  --   --   --   --  50 62 63  BILITOT  --   --   --   --  0.9 0.7 0.8  ALBUMIN 2.0*  --    --   --  1.9* 2.2* 2.1*  CRP  --   --   --  3.2* 2.4* 3.2* 5.7*  PROCALCITON  --   --   --  0.40 0.43 0.46 0.50  INR  --   --  1.0  --   --   --   --   MG 2.2 2.2  --  2.3 2.0 2.0 2.1  PHOS 5.4* 6.2*  --   --  5.4* 4.6 4.1  CALCIUM  8.4* 8.3*  --  8.7* 8.4* 8.7* 8.6*      Recent Labs  Lab 05/27/24 0418 05/28/24 0601 05/28/24 0602 05/29/24 0615 05/30/24 0546 05/31/24 0517  CRP  --   --  3.2* 2.4* 3.2* 5.7*  PROCALCITON  --   --  0.40 0.43 0.46 0.50  INR  --  1.0  --   --   --   --   MG 2.2  --  2.3 2.0 2.0 2.1  CALCIUM  8.3*  --  8.7* 8.4* 8.7* 8.6*    --------------------------------------------------------------------------------------------------------------- Lab Results  Component Value Date   CHOL 82 05/12/2024   HDL 27 (L) 05/12/2024   LDLCALC 39 05/12/2024   TRIG 61 05/25/2024   CHOLHDL 3.0 05/12/2024    Lab Results  Component Value Date   HGBA1C See Scanned report in [place] Link 05/12/2024   No results for input(s): TSH, T4TOTAL, FREET4, T3FREE, THYROIDAB in the last 72 hours. No results for input(s): VITAMINB12, FOLATE, FERRITIN, TIBC, IRON , RETICCTPCT in the last 72 hours. ------------------------------------------------------------------------------------------------------------------ Cardiac Enzymes No results for input(s): CKMB, TROPONINI, MYOGLOBIN in the last 168 hours.  Invalid input(s): CK  Micro Results No results found for this or any previous visit (from the past 240 hours).   Radiology Report DG Chest Port 1 View Result Date: 05/30/2024 EXAM: 1 VIEW XRAY OF THE CHEST 05/30/2024 08:03:00 AM COMPARISON: 05/28/2024 CLINICAL HISTORY: SOB (shortness of breath) FINDINGS: LUNGS AND PLEURA: Emphysematous changes. Chronic interstitial changes. Stable patchy opacities in left upper lung. No pleural effusion. No pneumothorax. HEART AND MEDIASTINUM: Atherosclerotic calcifications of aortic arch. No acute abnormality of the  cardiac and mediastinal silhouettes. BONES AND SOFT TISSUES: No acute osseous abnormality. IMPRESSION: 1. Stable patchy opacities in the left upper lung. 2. Emphysematous changes and chronic interstitial changes. Electronically signed by: Waddell Calk MD 05/30/2024 08:34 AM EDT RP Workstation: [id number]     Signature  -   Lavada Stank M.D on 05/31/2024 at 8:25 AM   -  To page go to [email or web address]

## 2024-05-31 NOTE — Progress Notes (Signed)
 Occupational Therapy Treatment Patient Details Name: Clarence Maldonado MRN: [id number] DOB: 01-22-1946 Today's Date: 05/31/2024   History of present illness 78 year old man who presented via EMS 8/8 for acute respiratory failure. EMS found in severe resp distress unresponsive. Pt intubated on arrival to [facility] and taken to ICU. Admitted for treatment of COPD, and HMPV infection. Developed perineal abscess, 8/15 underwent cystoscopy with I&D. Extubated 8/21  PMHx significant for HTN, HLD, COPD, CKD 3b, DMT2, BPH, R AKA, anxiety/depression.   OT comments  Patient seen in order to progress with functional mobility, ADL independence, and further assess cognition. Patient believes it is 1947 and that the OT is a family member stating several times, I love you so much during session. Patient on 3L O2 with HR ranging from 78-81 throughout session. Patient requiring max A to sit EOB, and unable to maintain sitting balance longer than 20 seconds sitting EOB. With breaks patient able to push up into sitting with RUE and OT providing close CGA. Patient fatiguing after less than 5 minutes sitting EOB, and placed in chair position in bed at end of session. OT recommendation remains appropriate; OT will continue to follow.       If plan is discharge home, recommend the following:  Two people to help with walking and/or transfers;Two people to help with bathing/dressing/bathroom;Assistance with cooking/housework;Assistance with feeding;Direct supervision/assist for medications management;Assist for transportation;Direct supervision/assist for financial management;Help with stairs or ramp for entrance;Supervision due to cognitive status   Equipment Recommendations  Other (comment) (defer to next venue)    Recommendations for Other Services      Precautions / Restrictions Precautions Precautions: Fall Recall of Precautions/Restrictions: Impaired Restrictions Weight Bearing Restrictions Per Provider Order: No        Mobility Bed Mobility Overal bed mobility: Needs Assistance Bed Mobility: Supine to Sit, Sit to Supine     Supine to sit: Max assist Sit to supine: Max assist   General bed mobility comments: Max A to complete, requiring multi-modal cues, improved lean, but would fatigue after 20 seconds    Transfers Overall transfer level: Needs assistance                 General transfer comment: unable to complete with OT due to fatigue     Balance Overall balance assessment: Needs assistance Sitting-balance support: Feet unsupported, Bilateral upper extremity supported Sitting balance-Leahy Scale: Poor Sitting balance - Comments: L lateral lean but able to correct with assist Postural control: Left lateral lean                                 ADL either performed or assessed with clinical judgement   ADL Overall ADL's : Needs assistance/impaired Eating/Feeding: Minimal assistance Eating/Feeding Details (indicate cue type and reason): unable to hold cup and sit up EOB without support                     Toilet Transfer: Total assistance   Toileting- Clothing Manipulation and Hygiene: Total assistance       Functional mobility during ADLs: Maximal assistance;+2 for physical assistance;+2 for safety/equipment;Cueing for safety;Cueing for sequencing General ADL Comments: Patient seen in order to progress with functional mobility, ADL independence, and further assess cognition. Patient believes it is 1947 and that the OT is a family member stating several times, I love you so much during session. Patient on 3L O2 with HR ranging from 78-81 throughout  session. Patient requiring max A to sit EOB, and unable to maintain sitting balance longer than 20 seconds sitting EOB. With breaks patient able to push up into sitting with RUE and OT providing close CGA. Patient fatiguing after less than 5 minutes sitting EOB, and placed in chair position in bed at end of  session. OT recommendation remains appropriate; OT will continue to follow.    Extremity/Trunk Assessment              Occupational psychologist Communication: Impaired Factors Affecting Communication: Difficulty expressing self;Reduced clarity of speech   Cognition Arousal: Alert Behavior During Therapy: Flat affect     Orientation impairments: Place, Time, Situation Awareness: Intellectual awareness impaired, Online awareness impaired Memory impairment (select all impairments): Short-term memory, Working [employer] fast streamer, Non-declarative long-term memory, Geneticist, molecular long-term memory Attention impairment (select first level of impairment): Focused attention Executive functioning impairment (select all impairments): Initiation, Organization, Problem solving, Reasoning, Sequencing OT - Cognition Comments: Improved command following, thought it was 1947, and OT was a family member                 Following commands: Impaired Following commands impaired: Follows one step commands with increased time, Follows one step commands inconsistently      Cueing   Cueing Techniques: Verbal cues, Tactile cues, Visual cues  Exercises      Shoulder Instructions       General Comments VSS on 3L    Pertinent Vitals/ Pain       Pain Assessment Pain Assessment: Faces Faces Pain Scale: Hurts a little bit Pain Location: generalized with movement Pain Descriptors / Indicators: Discomfort, Grimacing Pain Intervention(s): Limited activity within patient's tolerance, Monitored during session, Repositioned  Home Living Family/patient expects to be discharged to:: Skilled nursing facility                                        Prior Functioning/Environment              Frequency  Min 2X/week        Progress Toward Goals  OT Goals(current goals can now be found in the care plan section)  Progress towards OT goals:  Progressing toward goals  Acute Rehab OT Goals Patient Stated Goal: unable OT Goal Formulation: Patient unable to participate in goal setting Time For Goal Achievement: 06/09/24 Potential to Achieve Goals: Fair  Plan      Co-evaluation                 AM-PAC OT 6 Clicks Daily Activity     Outcome Measure   Help from another person eating meals?: A Little Help from another person taking care of personal grooming?: A Lot Help from another person toileting, which includes using toliet, bedpan, or urinal?: Total Help from another person bathing (including washing, rinsing, drying)?: A Lot Help from another person to put on and taking off regular upper body clothing?: A Lot Help from another person to put on and taking off regular lower body clothing?: Total 6 Click Score: 11    End of Session    OT Visit Diagnosis: Unsteadiness on feet (R26.81);Other abnormalities of gait and mobility (R26.89);Muscle weakness (generalized) (M62.81);Other symptoms and signs involving cognitive function;Adult, failure to thrive (R62.7)   Activity Tolerance Patient limited by fatigue  Patient Left in bed;with call bell/phone within reach;with bed alarm set   Nurse Communication Mobility status        Time: 8882-8862 OT Time Calculation (min): 20 min  Charges: OT General Charges $OT Visit: 1 Visit OT Treatments $Self Care/Home Management : 8-22 mins  Ronal Gift E. Cassady Turano, OTR/L Acute Rehabilitation Services [phone]   Ronal Gift Salt 05/31/2024, 12:13 PM

## 2024-05-31 NOTE — Progress Notes (Signed)
   12 Days Post-Op Subjective: Alert, oriented, and in no distress.  Patient has extremely wet rhonchorous cough and is complaining of profound weakness this morning.  Objective: Vital signs in last 24 hours: Temp:  [96.2 F (35.7 C)-98.6 F (37 C)] 97.6 F (36.4 C) (08/27 0835) Pulse Rate:  [65-111] 70 (08/27 0124) Resp:  [14-15] 14 (08/27 0124) BP: (111-147)/(60-69) 140/60 (08/27 0835) SpO2:  [96 %-100 %] 98 % (08/27 0911) FiO2 (%):  [40 %] 40 % (08/26 2300) Weight:  [58 kg] 58 kg (08/27 0701)  Assessment/Plan:   #Perineal abscess  I&D with Dr. Selma on 05/19/2024.  Open scrotal wound with deep track towards pubis.  No suspicion of systemic infection.  Patient remains afebrile and WBC within normal limits.  Nursing to continue daily wound care.  They have been utilizing Mepilex dressings for the last 48 hours to minimize soiling of the dressing, which seems to be working well.  Everything was C/D/I on exam today with viable pink edges.   Expect this to primarily close by secondary intention.  Please call with questions or acute changes  Intake/Output from previous day: 08/26 0701 - 08/27 0700 In: -  Out: 1300 [Urine:1300]  Intake/Output this shift: Total I/O In: -  Out: 650 [Urine:650]  Physical Exam:  General: Intubated/sedated CV: No cyanosis Lungs: Ventilator dependent Abdomen: Soft, NTND, no rebound or guarding Gu: 42F Foley catheter in place draining clear yellow urine.  Lab Results: Recent Labs    05/29/24 0615 05/30/24 0546 05/31/24 0517  HGB 11.0* 11.5* 10.8*  HCT 35.8* 38.7* 35.5*   BMET Recent Labs    05/30/24 0546 05/31/24 0517  NA 144 146*  K 4.4 4.8  CL 105 107  CO2 30 29  GLUCOSE 93 36*  BUN 88* 77*  CREATININE 1.79* 1.68*  CALCIUM  8.7* 8.6*  HGB 11.5* 10.8*  WBC 12.2* 8.9     Studies/Results: DG Chest Port 1 View Result Date: 05/30/2024 EXAM: 1 VIEW XRAY OF THE CHEST 05/30/2024 08:03:00 AM COMPARISON: 05/28/2024 CLINICAL  HISTORY: SOB (shortness of breath) FINDINGS: LUNGS AND PLEURA: Emphysematous changes. Chronic interstitial changes. Stable patchy opacities in left upper lung. No pleural effusion. No pneumothorax. HEART AND MEDIASTINUM: Atherosclerotic calcifications of aortic arch. No acute abnormality of the cardiac and mediastinal silhouettes. BONES AND SOFT TISSUES: No acute osseous abnormality. IMPRESSION: 1. Stable patchy opacities in the left upper lung. 2. Emphysematous changes and chronic interstitial changes. Electronically signed by: Waddell Calk MD 05/30/2024 08:34 AM EDT RP Workstation: [id number]      LOS: 19 days   Ole Bourdon, NP [facility] Pager: [phone]  05/31/2024, 11:17 AM

## 2024-05-31 NOTE — Progress Notes (Signed)
 Nutrition Follow-up  DOCUMENTATION CODES:  Severe malnutrition in context of chronic illness  INTERVENTION:  Ensure Plus High Protein po TID, each supplement provides 350 kcal and 20 grams of protein Multivitamin w/ minerals daily Feeding assist with all meals Discontinue enteral nutrition regimen  NUTRITION DIAGNOSIS:  Severe Malnutrition related to chronic illness (COPD) as evidenced by severe muscle depletion, severe fat depletion. - Ongoing   GOAL:  Patient will meet greater than or equal to 90% of their needs - Ongoing  MONITOR:  PO intake, Supplement acceptance, Labs, Weight trends  REASON FOR ASSESSMENT:  Ventilator, Consult Assessment of nutrition requirement/status  ASSESSMENT:  Pt admitted from rehab with worsening shortness of breath, found to have severe respiratory distress and unresponsive. PMH significant for COPD, CKD 3b, T2DM, HTN, HLD, right AKA, anxiety/depression.  8/8: admitted with COPD exacerbation, intubated 8/11: Cortrak placement (tip in the duodenum) 8/15: s/p cystoscopy and I&D of perineal abscess 8/21: extubated 8/23: transferred to floor; diet advanced to full lqiuids 8/24: SLP BSE; diet advanced to dysphagia 1, thin liquids; Cortrak removed  RD working remotely at time of follow-up. Pt with only one meal intake recorded from 8/23, when pt was on full liquids. Cortrak was discontinued day diet was advanced. Discussed with RN, reports that pt did not eat much for breakfast this morning. Discussed trying an oral nutrition supplement to help with PO intake since removal of Cortrak.   Admit weight: 61.9 kg Current weight: 58 kg (non-pitting generalized & BUE edema)  Nutrition Related Medications: Novolog  0-9 units TID, Protonix  Labs: Sodium 146, Potassium 4.8, BUN 77, Creatinine 1.68, Phosphorus 4.1, Magnesium  2.1, CRP 5.7  CBG: 26-162 mg/dL x 24 hrs   UOP: 8699 mL x 24 hrs   Diet Order:   Diet Order             DIET - DYS 1 Room service  appropriate? Yes; Fluid consistency: Thin  Diet effective now                   EDUCATION NEEDS:   No education needs have been identified at this time  Skin:  Skin Assessment: Reviewed RN Assessment  Last BM:  8/25 - Type 6  Height:  Ht Readings from Last 1 Encounters:  05/23/24 5' 5 (1.651 m)   Weight:  Wt Readings from Last 1 Encounters:  05/31/24 58 kg   Ideal Body Weight:  61.2 kg  BMI:  Body mass index is 21.28 kg/m.  Estimated Nutritional Needs:  Kcal:  1600-1800 Protein:  95-110g Fluid:  >/=1.6L   Nestora Glatter RD, LDN Clinical Dietitian

## 2024-05-31 NOTE — TOC Progression Note (Signed)
 Transition of Care [facility]) - Progression Note    Patient Details  Name: Clarence Maldonado MRN: [id number] Date of Birth: 1945-11-13  [facility] CM/SW Contact  Inocente GORMAN Kindle, LCSW Phone Number: 05/31/2024, 9:41 AM  Clinical Narrative:    CSW continuing to follow.    Expected Discharge Plan: Long Term Nursing Home (From [facility] [employer]) Barriers to Discharge: Continued Medical Work up               Expected Discharge Plan and Services In-house Referral: Clinical Social Work     Living arrangements for the past 2 months: Skilled Nursing Facility                                       Social Drivers of Health (SDOH) Interventions SDOH Screenings   Food Insecurity: No Food Insecurity (10/20/2023)  Housing: Unknown (10/20/2023)  Transportation Needs: No Transportation Needs (10/20/2023)  Utilities: Not At Risk (10/20/2023)  Depression (PHQ2-9): Low Risk  (10/14/2023)  Financial Resource Strain: Low Risk  (03/23/2019)  Physical Activity: Inactive (03/23/2019)  Social Connections: Socially Isolated (03/23/2019)  Stress: No Stress Concern Present (03/23/2019)  Tobacco Use: Medium Risk (05/19/2024)    Readmission Risk Interventions     No data to display

## 2024-06-01 DIAGNOSIS — J9602 Acute respiratory failure with hypercapnia: Secondary | ICD-10-CM | POA: Diagnosis not present

## 2024-06-01 LAB — MAGNESIUM: Magnesium: 1.9 mg/dL (ref 1.7–2.4)

## 2024-06-01 LAB — COMPREHENSIVE METABOLIC PANEL WITH GFR
ALT: 27 U/L (ref 0–44)
AST: 23 U/L (ref 15–41)
Albumin: 2 g/dL — ABNORMAL LOW (ref 3.5–5.0)
Alkaline Phosphatase: 66 U/L (ref 38–126)
Anion gap: 5 (ref 5–15)
BUN: 59 mg/dL — ABNORMAL HIGH (ref 8–23)
CO2: 31 mmol/L (ref 22–32)
Calcium: 8.5 mg/dL — ABNORMAL LOW (ref 8.9–10.3)
Chloride: 107 mmol/L (ref 98–111)
Creatinine, Ser: 1.68 mg/dL — ABNORMAL HIGH (ref 0.61–1.24)
GFR, Estimated: 42 mL/min — ABNORMAL LOW (ref >60)
Glucose, Bld: 78 mg/dL (ref 70–99)
Potassium: 5.3 mmol/L — ABNORMAL HIGH (ref 3.5–5.1)
Sodium: 143 mmol/L (ref 135–145)
Total Bilirubin: 0.8 mg/dL (ref 0.0–1.2)
Total Protein: 5.8 g/dL — ABNORMAL LOW (ref 6.5–8.1)

## 2024-06-01 LAB — GLUCOSE, CAPILLARY
Glucose-Capillary: 73 mg/dL (ref 70–99)
Glucose-Capillary: 127 mg/dL — ABNORMAL HIGH (ref 70–99)
Glucose-Capillary: 72 mg/dL (ref 70–99)
Glucose-Capillary: 196 mg/dL — ABNORMAL HIGH (ref 70–99)
Glucose-Capillary: 177 mg/dL — ABNORMAL HIGH (ref 70–99)
Glucose-Capillary: 90 mg/dL (ref 70–99)

## 2024-06-01 LAB — CBC WITH DIFFERENTIAL/PLATELET
Abs Immature Granulocytes: 0.02 K/uL (ref 0.00–0.07)
Basophils Absolute: 0 K/uL (ref 0.0–0.1)
Basophils Relative: 1 %
Eosinophils Absolute: 0.2 K/uL (ref 0.0–0.5)
Eosinophils Relative: 4 %
HCT: 34.2 % — ABNORMAL LOW (ref 39.0–52.0)
Hemoglobin: 10.2 g/dL — ABNORMAL LOW (ref 13.0–17.0)
Immature Granulocytes: 0 %
Lymphocytes Relative: 13 %
Lymphs Abs: 0.8 K/uL (ref 0.7–4.0)
MCH: 26.7 pg (ref 26.0–34.0)
MCHC: 29.8 g/dL — ABNORMAL LOW (ref 30.0–36.0)
MCV: 89.5 fL (ref 80.0–100.0)
Monocytes Absolute: 0.5 K/uL (ref 0.1–1.0)
Monocytes Relative: 7 %
Neutro Abs: 4.8 K/uL (ref 1.7–7.7)
Neutrophils Relative %: 75 %
Platelets: 147 K/uL — ABNORMAL LOW (ref 150–400)
RBC: 3.82 MIL/uL — ABNORMAL LOW (ref 4.22–5.81)
RDW: 16.6 % — ABNORMAL HIGH (ref 11.5–15.5)
WBC: 6.4 K/uL (ref 4.0–10.5)
nRBC: 0 % (ref 0.0–0.2)

## 2024-06-01 LAB — C-REACTIVE PROTEIN: CRP: 11.2 mg/dL — ABNORMAL HIGH (ref <1.0)

## 2024-06-01 LAB — PHOSPHORUS: Phosphorus: 3.5 mg/dL (ref 2.5–4.6)

## 2024-06-01 LAB — PROCALCITONIN: Procalcitonin: 0.39 ng/mL

## 2024-06-01 MED ORDER — DEXTROSE 50 % IV SOLN
25.0000 mL | Freq: Once | INTRAVENOUS | Status: AC
  Administered 2024-06-01: 25 mL via INTRAVENOUS
  Filled 2024-06-01: qty 50

## 2024-06-01 MED ORDER — SODIUM ZIRCONIUM CYCLOSILICATE 10 G PO PACK
10.0000 g | PACK | Freq: Two times a day (BID) | ORAL | Status: AC
  Administered 2024-06-01 (×2): 10 g via ORAL
  Filled 2024-06-01 (×2): qty 1

## 2024-06-01 NOTE — NC FL2 (Signed)
 [place]  MEDICAID FL2 LEVEL OF CARE FORM     IDENTIFICATION  Patient Name: Clarence Maldonado Birthdate: 07/09/1946 Sex: male Admission Date (Current Location): 05/12/2024  [facility] and [place] Number:  Producer, television/film/video and Address:  [facility], [address] [place], [place] [address]      Provider Number: [id number]  Attending Physician Name and Address:  Dennise Lavada POUR, MD  Relative Name and Phone Number:       Current Level of Care: Hospital Recommended Level of Care: Skilled Nursing Facility Prior Approval Number:    Date Approved/Denied:   PASRR Number: [id number]  Discharge Plan: SNF    Current Diagnoses: Patient Active Problem List   Diagnosis Date Noted   Protein-calorie malnutrition, severe 05/19/2024   Acute respiratory failure with hypercapnia (HCC) 05/12/2024   Malnutrition of moderate degree 10/27/2023   Cellulitis and abscess of right lower extremity 10/23/2023   Cellulitis 10/22/2023   Aortic atherosclerosis (HCC) 02/17/2023   Dyspnea on exertion 09/16/2022   History of TB (tuberculosis) 10/17/2020   Nonischemic cardiomyopathy (HCC) 03/13/2020   Contact with or exposure to viral disease 02/01/2020   Special screening examination for respiratory tuberculosis 02/01/2020   Chest pain 01/26/2020   Acute lower UTI 07/23/2019   Fatigue    Lung infiltrate    Pulmonary tuberculosis with cavitation 06/06/2019   Moderate protein-calorie malnutrition (HCC) 06/06/2019   Hypokalemia 05/15/2019   Anemia 05/15/2019   Hypermetropia of both eyes 03/21/2018   Weight loss 03/21/2018   Hyperlipidemia associated with type 2 diabetes mellitus (HCC) 03/21/2018   Insomnia 02/03/2017   Cognitive change 01/29/2016   Illiterate 01/03/2014   Controlled type 2 diabetes mellitus with stage 3 chronic kidney disease, without long-term current use of insulin  (HCC) 01/05/2013   Stranguria 01/05/2013   Essential hypertension 01/04/2013   CKD  (chronic kidney disease) stage 3, GFR 30-59 ml/min (HCC) 01/04/2013   Hyperlipidemia 01/04/2013   Tobacco use disorder 01/04/2013   BPH without obstruction/lower urinary tract symptoms 01/04/2013    Orientation RESPIRATION BLADDER Height & Weight     Self  O2 (3L nasal cannula) Incontinent, Indwelling catheter Weight: 127 lb 13.9 oz (58 kg) Height:  5' 5 (165.1 cm)  BEHAVIORAL SYMPTOMS/MOOD NEUROLOGICAL BOWEL NUTRITION STATUS      Incontinent Diet (See dc summary)  AMBULATORY STATUS COMMUNICATION OF NEEDS Skin   Extensive Assist Verbally Surgical wounds (Surgical incision on perineum)                       Personal Care Assistance Level of Assistance  Bathing, Feeding, Dressing Bathing Assistance: Limited assistance Feeding assistance: Maximum assistance Dressing Assistance: Maximum assistance     Functional Limitations Info             SPECIAL CARE FACTORS FREQUENCY                       Contractures Contractures Info: Not present    Additional Factors Info  Code Status, Allergies, Isolation Precautions Code Status Info: Full Allergies Info: NKA     Isolation Precautions Info: MRSA     Current Medications (06/01/2024):  This is the current hospital active medication list Current Facility-Administered Medications  Medication Dose Route Frequency Provider Last Rate Last Admin   acetaminophen  (TYLENOL ) tablet 650 mg  650 mg Oral Q4H PRN Singh, Prashant K, MD       albuterol  (PROVENTIL ) (2.5 MG/3ML) 0.083% nebulizer solution 2.5  mg  2.5 mg Nebulization Q4H PRN Byrum, Berk S, MD   2.5 mg at 05/31/24 0911   arformoterol  (BROVANA ) nebulizer solution 15 mcg  15 mcg Nebulization BID Olalere, Adewale A, MD   15 mcg at 06/01/24 0740   atorvastatin  (LIPITOR) tablet 40 mg  40 mg Oral Daily Singh, Prashant K, MD   40 mg at 06/01/24 0908   budesonide  (PULMICORT ) nebulizer solution 0.5 mg  0.5 mg Nebulization BID Payne, [name] D, PA-C   0.5 mg at 06/01/24 9257    Chlorhexidine  Gluconate Cloth 2 % PADS 6 each  6 each Topical Daily Mannam, Praveen, MD   6 each at 06/01/24 0908   docusate (COLACE) 50 MG/5ML liquid 100 mg  100 mg Oral BID PRN Singh, Prashant K, MD       doxazosin  (CARDURA ) tablet 4 mg  4 mg Oral Daily Singh, Prashant K, MD   4 mg at 06/01/24 0908   feeding supplement (ENSURE PLUS HIGH PROTEIN) liquid 237 mL  237 mL Oral TID BM Singh, Prashant K, MD   237 mL at 06/01/24 1412   fentaNYL  (SUBLIMAZE ) injection 25-50 mcg  25-50 mcg Intravenous Q30 min PRN Valri Lamarr LABOR, NP   50 mcg at 05/26/24 2242   haloperidol  lactate (HALDOL ) injection 2 mg  2 mg Intramuscular Q6H PRN Singh, Prashant K, MD       heparin  injection 5,000 Units  5,000 Units Subcutaneous Q8H Kassie Acquanetta Bradley, MD   5,000 Units at 06/01/24 1412   insulin  aspart (novoLOG ) injection 0-9 Units  0-9 Units Subcutaneous TID WC Singh, Prashant K, MD   2 Units at 06/01/24 1235   metoprolol  tartrate (LOPRESSOR ) injection 5 mg  5 mg Intravenous Q6H PRN Byrum, Tevion S, MD   5 mg at 05/27/24 0055   multivitamin with minerals tablet 1 tablet  1 tablet Oral Daily Singh, Prashant K, MD   1 tablet at 06/01/24 0908   Oral care mouth rinse  15 mL Mouth Rinse 4 times per day Mannam, Praveen, MD   15 mL at 06/01/24 1235   Oral care mouth rinse  15 mL Mouth Rinse PRN Mannam, Praveen, MD       pantoprazole  (PROTONIX ) EC tablet 40 mg  40 mg Oral Daily Singh, Prashant K, MD   40 mg at 06/01/24 0908   polyethylene glycol (MIRALAX  / GLYCOLAX ) packet 17 g  17 g Oral Daily PRN Singh, Prashant K, MD       QUEtiapine  (SEROQUEL ) tablet 25 mg  25 mg Oral QHS Singh, Prashant K, MD   25 mg at 05/31/24 2124   revefenacin  (YUPELRI ) nebulizer solution 175 mcg  175 mcg Nebulization Daily Payne, [name] D, PA-C   175 mcg at 06/01/24 0740   sodium zirconium cyclosilicate  (LOKELMA ) packet 10 g  10 g Oral BID Singh, Prashant K, MD   10 g at 06/01/24 1013     Discharge Medications: Please see discharge summary for a  list of discharge medications.  Relevant Imaging Results:  Relevant Lab Results:   Additional Information SS# [id number]  Inocente GORMAN Kindle, LCSW

## 2024-06-01 NOTE — Progress Notes (Signed)
 Physical Therapy Treatment Patient Details Name: Clarence Maldonado MRN: [id number] DOB: 1946-03-11 Today's Date: 06/01/2024   History of Present Illness 78 year old man who presented via EMS 8/8 for acute respiratory failure. EMS found in severe resp distress unresponsive. Pt intubated on arrival to [facility] and taken to ICU. Admitted for treatment of COPD, and HMPV infection. Developed perineal abscess, 8/15 underwent cystoscopy with I&D. Extubated 8/21  PMHx significant for HTN, HLD, COPD, CKD 3b, DMT2, BPH, R AKA, anxiety/depression.    PT Comments  Pt tolerated treatment well today. +2 Mod A to transfer to chair with HHA via stand pivot. No change in DC/DME recs at this time. PT will continue to follow.     If plan is discharge home, recommend the following: A lot of help with walking and/or transfers;A lot of help with bathing/dressing/bathroom;Assistance with cooking/housework;Direct supervision/assist for medications management;Direct supervision/assist for financial management;Assist for transportation;Help with stairs or ramp for entrance;Supervision due to cognitive status   Can travel by private vehicle     No  Equipment Recommendations  Rolling walker (2 wheels)    Recommendations for Other Services       Precautions / Restrictions Precautions Precautions: Fall Recall of Precautions/Restrictions: Impaired Restrictions Weight Bearing Restrictions Per Provider Order: No     Mobility  Bed Mobility Overal bed mobility: Needs Assistance Bed Mobility: Supine to Sit     Supine to sit: +2 for physical assistance, Mod assist     General bed mobility comments: +2 Mod A for trunk elevation.    Transfers Overall transfer level: Needs assistance Equipment used: 2 person hand held assist Transfers: Sit to/from Stand, Bed to chair/wheelchair/BSC Sit to Stand: +2 physical assistance, Mod assist Stand pivot transfers: +2 physical assistance, Mod assist         General transfer  comment: +2 Mod A to stand pivot transfer to chair.    Ambulation/Gait               General Gait Details: deferred   Stairs             Wheelchair Mobility     Tilt Bed    Modified Rankin (Stroke Patients Only)       Balance Overall balance assessment: Needs assistance Sitting-balance support: Feet unsupported, Bilateral upper extremity supported Sitting balance-Leahy Scale: Poor Sitting balance - Comments: L lateral lean but able to correct with assist Postural control: Left lateral lean Standing balance support: Bilateral upper extremity supported, During functional activity Standing balance-Leahy Scale: Poor Standing balance comment: Reliant on therapist                            Communication Communication Communication: Impaired Factors Affecting Communication: Difficulty expressing self;Reduced clarity of speech  Cognition Arousal: Alert Behavior During Therapy: Flat affect   PT - Cognitive impairments: No family/caregiver present to determine baseline, Difficult to assess, Orientation, Awareness, Memory, Sequencing, Problem solving, Attention, Safety/Judgement Difficult to assess due to: Impaired communication Orientation impairments: Place, Time, Situation                   PT - Cognition Comments: Pt appears to be easily distracted by many things. Tends to ramble however was able to follow commands more than previous session. Following commands: Impaired Following commands impaired: Follows one step commands with increased time, Follows one step commands inconsistently    Cueing Cueing Techniques: Verbal cues, Tactile cues, Visual cues  Exercises  General Comments General comments (skin integrity, edema, etc.): VSS      Pertinent Vitals/Pain Pain Assessment Pain Assessment: No/denies pain    Home Living                          Prior Function            PT Goals (current goals can now be found in  the care plan section) Progress towards PT goals: Progressing toward goals    Frequency    Min 2X/week      PT Plan      Co-evaluation              AM-PAC PT 6 Clicks Mobility   Outcome Measure  Help needed turning from your back to your side while in a flat bed without using bedrails?: A Lot Help needed moving from lying on your back to sitting on the side of a flat bed without using bedrails?: A Lot Help needed moving to and from a bed to a chair (including a wheelchair)?: A Lot Help needed standing up from a chair using your arms (e.g., wheelchair or bedside chair)?: A Lot Help needed to walk in hospital room?: Total Help needed climbing 3-5 steps with a railing? : Total 6 Click Score: 10    End of Session Equipment Utilized During Treatment: Gait belt;Oxygen  Activity Tolerance: Patient tolerated treatment well Patient left: in chair;with call bell/phone within reach;with chair alarm set;Other (comment) (Handoff to speech) Nurse Communication: Mobility status;Need for lift equipment PT Visit Diagnosis: Difficulty in walking, not elsewhere classified (R26.2);Muscle weakness (generalized) (M62.81);Unsteadiness on feet (R26.81);Other symptoms and signs involving the nervous system (R29.898);Adult, failure to thrive (R62.7)     Time: 8894-8886 PT Time Calculation (min) (ACUTE ONLY): 8 min  Charges:    $Therapeutic Activity: 8-22 mins PT General Charges $$ ACUTE PT VISIT: 1 Visit                     Sueellen NOVAK, PT, DPT Acute Rehab Services [phone]    Shequilla Goodgame 06/01/2024, 11:24 AM

## 2024-06-01 NOTE — Plan of Care (Signed)

## 2024-06-01 NOTE — Progress Notes (Addendum)
 Speech Language Pathology Treatment: Dysphagia  Patient Details Name: Clarence Maldonado MRN: [id number] DOB: April 29, 1946 Today's Date: 06/01/2024 Time: 8888-8872 8972-8956 SLP Time Calculation (min) (ACUTE ONLY): 32 min  Assessment / Plan / Recommendation Clinical Impression  Patient was alert and awake when SLP entered the room. Patient started with water  and was drinking an ensure from earlier. Patient did well drinking through a straw and expressed more labial strength with the straw compared to the initial evaluation. Patient requested the bathroom during our session and nurse came in to help assist him. PT moved patient to chair after bathroom. SLP entered when patient was sitting in chair and offered peanut butter cracker and graham cracker. Patient had prolonged mastication with the cracker. Patient was edentulous and prolonged mastication is likely due to this. The patient had no overt s/s of aspiration.  Recommended Dys 3 (mechanical soft) and thin liquids. SLP will follow up next week to montior progress and sign off.   HPI HPI: Patient is a 78 y.o. male with PMH: COPD, CDK 3b, DMT2, HTN, HLD, BPH, R AKA, anxiety/depression. During 2020 hospitalization he required SLP assessment and intervention due to dysphagia but did advance to regular solids, thin liquids with no f/u warranted. No documented dysphagia intervention since 2020 seen per chart review. Patient presented to the hospital on 05/12/2024 from SNF with worsening SOB. When EMS arrived he was in severe respiratory distress and unresponsive. Patient intubated on arrival, he was kept in ICU, his workup was suggestive of metapneumovirus pneumonia, sepsis, he was further diagnosed with perineal abscess, drained by urology.  He was extubated on 05/25/24. He passed [facility] swallow screen with RN on 05/27/24 and has been on full liquids diet. Cortrak feeding tube is in place. SLP swallow evaluation ordered on 05/28/2024 to assess his swallow function as  he has a weak cough, is dehydrated, has nasal trumpet in place for suctioning.      SLP Plan  Continue with current plan of care          Recommendations  Diet recommendations: Dysphagia 3 (mechanical soft);Thin liquid Liquids provided via: Straw Medication Administration: Other (Comment) (as tolerated) Supervision: Staff to assist with self feeding Compensations: Slow rate;Small sips/bites;Minimize environmental distractions Postural Changes and/or Swallow Maneuvers: Seated upright 90 degrees                  Oral care BID     Dysphagia, unspecified (R13.10)     Continue with current plan of care    Damien Hy  Graduate SLP Clinican

## 2024-06-01 NOTE — Progress Notes (Signed)
 PROGRESS NOTE                                                                                                                                                                                                             Patient Demographics:    Clarence Maldonado, is a 78 y.o. male, DOB - [birth year], FMW:[id number]  Outpatient Primary MD for the patient is Gil Greig BRAVO, NP    LOS - 20  Admit date - 05/12/2024    Chief Complaint  Patient presents with   Respiratory Distress       Brief Narrative (HPI from H&P)    78 year old man who presented to [facility] 8/8 for acute respiratory failure. PMHx significant for HTN, HLD, COPD, CKD 3b, DMT2, BPH, R AKA, anxiety/depression.   Patient resides at rehab facility. Having SOB on 8/8 and prescribed rocephin /azithro. Worsening SOB. EMS called found in severe resp distress unresponsive. LMA placed and being bagged en route to [facility] ED. Wheezing b/l. Patient intubated on arrival, he was kept in ICU, his workup was suggestive of metapneumovirus pneumonia, sepsis, he was further diagnosed with perineal abscess, drained by urology.  He was seen by PCCM, urology, ID.  He was stabilized and transferred to my care on 05/28/2024 on day 16 of his hospital stay.    Still has a NG tube, still appears extremely weak dehydrated and deconditioned, extremely poor cough reflex.    Significant Hospital Events:    8/8 - Admit w/ COPD, HMPV infection, intubated. 8/13 - Weaning on vent, PSV 10/5, mild tachypnea. Remains on Precedex . 8/15 Developed perineal abscess.  Underwent cystoscopy and I&D with purulent drainage, wound packed 8/16 Failing PSV, on low dose levoped 8/19: failed SBT, off vasopressors. Treated for hyperkalemia 8/20: tol PS wean  05/25/2024 extubating 05/28/2024.  Transferred to [facility]     Subjective:   Patient in bed feels better denies any headache chest or abdominal pain, no shortness of  breath or focal weakness.   Assessment  & Plan :    Sepsis upon admission due to combination of metapneumovirus and Pseudomonas pneumonia, perineal abscess with Candida glabrata - he is initially admitted to ICU and intubated, seen by urology, ID.  He underwent incision and drainage by urology on 05/19/2024.  Currently he has finished his antibiotics & micafungin , urology assisting with wound care for  his perineal abscess, still quite weak and deconditioned monitor closely.  Metapneumovirus pneumonia, weak cough reflex, at risk for aspiration, dysphagia.  Currently on tube feeds along with free water  flushes, cough reflex quite poor, have requested speech therapy to evaluate, okay remains at risk for aspiration pneumonia monitor closely.  Discussed with RT on 05/31/2024 he will require continued chest PT, NT suction and as needed Mucomyst  as he still has quite a bit of coarse breath sounds due to airway secretions.  I-S and flutter valve also on board.  Acute encephalopathy  Due to above and mild hypercapnia.  No headache or focal deficits, as needed Haldol , nighttime Seroquel , BiPAP for a few hours.  Discussed with pulmonary Dr. Claudene on 05/30/2024.  Continue to monitor.    Severe dehydration with AKI - CKD 3B and hypernatremia.  AKI gradually improving, continue IV fluids and free water  flushes baseline creatinine appears to be close to 2, poor oral intake adjusted IV fluids again on 05/31/2024   Hyperkalemia.  Treated.    Dysphagia due to above.  In by speech therapy NG tube discontinued on 05/28/2024 evening, now on oral diet dysphagia 1.  Monitor  Chronic diastolic CHF.  EF around 70% this admission on 05/14/2024.  Currently compensated.  Continue beta-blocker.    BPH - cont foley, will order for his home flomax  to start 8/24   HTN  -BID metop, PRN hydral   DM2  -oral intake is erratic, discontinue long-acting insulin  completely on 05/31/2024 as he is having episodes of severe hypoglycemia,  sliding scale for now.    Lab Results  Component Value Date   HGBA1C See Scanned report in [place] Link 05/12/2024   CBG (last 3)  Recent Labs    06/01/24 0403 06/01/24 0451 06/01/24 0830  GLUCAP 73 127* 72           Condition - Extremely Guarded  Family Communication  :   Alyse Rush [phone] updated on 05/30/2024    Daughter [name] [phone]  on 05/28/2024 at 11 AM, message left again on 05/30/2024 at 11:20 AM.   Code Status : Full code   Consults  : PCCM, ID, urology  PUD Prophylaxis : PPI   Procedures  :            Disposition Plan  :    Status is: Inpatient   DVT Prophylaxis  :    heparin  injection 5,000 Units Start: 05/20/24 0600 SCDs Start: 05/12/24 1851   Lab Results  Component Value Date   PLT 147 (L) 06/01/2024    Diet :  Diet Order             DIET - DYS 1 Room service appropriate? Yes; Fluid consistency: Thin  Diet effective now                    Inpatient Medications  Scheduled Meds:  arformoterol   15 mcg Nebulization BID   atorvastatin   40 mg Oral Daily   budesonide  (PULMICORT ) nebulizer solution  0.5 mg Nebulization BID   Chlorhexidine  Gluconate Cloth  6 each Topical Daily   doxazosin   4 mg Oral Daily   feeding supplement  237 mL Oral TID BM   heparin  injection (subcutaneous)  5,000 Units Subcutaneous Q8H   insulin  aspart  0-9 Units Subcutaneous TID WC   metoprolol  tartrate  12.5 mg Oral BID   multivitamin with minerals  1 tablet Oral Daily   mouth rinse  15 mL Mouth Rinse 4  times per day   pantoprazole   40 mg Oral Daily   QUEtiapine   25 mg Oral QHS   revefenacin   175 mcg Nebulization Daily   sodium zirconium cyclosilicate   10 g Oral BID   Continuous Infusions:   PRN Meds:.acetaminophen , albuterol , docusate, fentaNYL  (SUBLIMAZE ) injection, haloperidol  lactate, metoprolol  tartrate, mouth rinse, polyethylene glycol    Objective:   Vitals:   06/01/24 0400 06/01/24 0740 06/01/24 0742 06/01/24 0828   BP: 134/71     Pulse: 69     Resp: 14     Temp: 97.9 F (36.6 C)   97.8 F (36.6 C)  TempSrc: Oral   Oral  SpO2: 96% 97% 97%   Weight:      Height:        Wt Readings from Last 3 Encounters:  05/31/24 58 kg  10/28/23 70.3 kg  10/14/23 71.2 kg     Intake/Output Summary (Last 24 hours) at 06/01/2024 0907 Last data filed at 06/01/2024 0200 Gross per 24 hour  Intake --  Output 1900 ml  Net -1900 ml     Physical Exam  Frail elderly male, awake much less confused , no new F.N deficits,   [place].AT,PERRAL Supple Neck, No JVD,   Symmetrical Chest wall movement, Good air movement bilaterally, coarse bilateral breath sounds RRR,No Gallops,Rubs or new Murmurs,  +ve B.Sounds, Abd Soft, No tenderness,   No Cyanosis, Foley catheter in place, right AKA       Data Review:    Recent Labs  Lab 05/28/24 0602 05/29/24 0615 05/30/24 0546 05/31/24 0517 06/01/24 0639  WBC 11.6* 10.6* 12.2* 8.9 6.4  HGB 12.3* 11.0* 11.5* 10.8* 10.2*  HCT 40.3 35.8* 38.7* 35.5* 34.2*  PLT 242 224 225 182 147*  MCV 87.0 87.5 89.4 88.3 89.5  MCH 26.6 26.9 26.6 26.9 26.7  MCHC 30.5 30.7 29.7* 30.4 29.8*  RDW 16.0* 16.2* 16.2* 16.4* 16.6*  LYMPHSABS 0.6* 0.8 0.5* 1.0 0.8  MONOABS 0.5 0.6 0.6 0.6 0.5  EOSABS 0.2 0.3 0.2 0.3 0.2  BASOSABS 0.1 0.0 0.1 0.0 0.0    Recent Labs  Lab 05/26/24 0456 05/27/24 0418 05/28/24 0601 05/28/24 0602 05/29/24 0615 05/30/24 0546 05/31/24 0517 06/01/24 0639  NA 145 151*  --  149* 144 144 146* 143  K 4.2 4.5  --  4.3 4.5 4.4 4.8 5.3*  CL 103 105  --  101 105 105 107 107  CO2 30 36*  --  34* 32 30 29 31   ANIONGAP 12 10  --  14 7 9 10 5   GLUCOSE 138* 94  --  131* 79 93 36* 78  BUN 107* 106*  --  97* 92* 88* 77* 59*  CREATININE 2.24* 2.15*  --  1.94* 1.75* 1.79* 1.68* 1.68*  AST  --   --   --   --  18 23 21 23   ALT  --   --   --   --  18 23 25 27   ALKPHOS  --   --   --   --  50 62 63 66  BILITOT  --   --   --   --  0.9 0.7 0.8 0.8  ALBUMIN 2.0*  --   --    --  1.9* 2.2* 2.1* 2.0*  CRP  --   --   --  3.2* 2.4* 3.2* 5.7* 11.2*  PROCALCITON  --   --   --  0.40 0.43 0.46 0.50 0.39  INR  --   --  1.0  --   --   --   --   --   MG 2.2 2.2  --  2.3 2.0 2.0 2.1 1.9  PHOS 5.4* 6.2*  --   --  5.4* 4.6 4.1 3.5  CALCIUM  8.4* 8.3*  --  8.7* 8.4* 8.7* 8.6* 8.5*      Recent Labs  Lab 05/28/24 0601 05/28/24 0602 05/29/24 0615 05/30/24 0546 05/31/24 0517 06/01/24 0639  CRP  --  3.2* 2.4* 3.2* 5.7* 11.2*  PROCALCITON  --  0.40 0.43 0.46 0.50 0.39  INR 1.0  --   --   --   --   --   MG  --  2.3 2.0 2.0 2.1 1.9  CALCIUM   --  8.7* 8.4* 8.7* 8.6* 8.5*    --------------------------------------------------------------------------------------------------------------- Lab Results  Component Value Date   CHOL 82 05/12/2024   HDL 27 (L) 05/12/2024   LDLCALC 39 05/12/2024   TRIG 61 05/25/2024   CHOLHDL 3.0 05/12/2024    Lab Results  Component Value Date   HGBA1C See Scanned report in [place] Link 05/12/2024   No results for input(s): TSH, T4TOTAL, FREET4, T3FREE, THYROIDAB in the last 72 hours. No results for input(s): VITAMINB12, FOLATE, FERRITIN, TIBC, IRON , RETICCTPCT in the last 72 hours. ------------------------------------------------------------------------------------------------------------------ Cardiac Enzymes No results for input(s): CKMB, TROPONINI, MYOGLOBIN in the last 168 hours.  Invalid input(s): CK  Micro Results No results found for this or any previous visit (from the past 240 hours).   Radiology Report No results found.    Signature  -   Lavada Stank M.D on 06/01/2024 at 9:07 AM   -  To page go to [email or web address]

## 2024-06-02 DIAGNOSIS — J9602 Acute respiratory failure with hypercapnia: Secondary | ICD-10-CM | POA: Diagnosis not present

## 2024-06-02 LAB — COMPREHENSIVE METABOLIC PANEL WITH GFR
ALT: 32 U/L (ref 0–44)
AST: 25 U/L (ref 15–41)
Albumin: 2 g/dL — ABNORMAL LOW (ref 3.5–5.0)
Alkaline Phosphatase: 78 U/L (ref 38–126)
Anion gap: 10 (ref 5–15)
BUN: 55 mg/dL — ABNORMAL HIGH (ref 8–23)
CO2: 28 mmol/L (ref 22–32)
Calcium: 8.6 mg/dL — ABNORMAL LOW (ref 8.9–10.3)
Chloride: 105 mmol/L (ref 98–111)
Creatinine, Ser: 1.88 mg/dL — ABNORMAL HIGH (ref 0.61–1.24)
GFR, Estimated: 36 mL/min — ABNORMAL LOW (ref >60)
Glucose, Bld: 118 mg/dL — ABNORMAL HIGH (ref 70–99)
Potassium: 5.1 mmol/L (ref 3.5–5.1)
Sodium: 143 mmol/L (ref 135–145)
Total Bilirubin: 0.8 mg/dL (ref 0.0–1.2)
Total Protein: 5.9 g/dL — ABNORMAL LOW (ref 6.5–8.1)

## 2024-06-02 LAB — CBC WITH DIFFERENTIAL/PLATELET
Abs Immature Granulocytes: 0.02 K/uL (ref 0.00–0.07)
Basophils Absolute: 0.1 K/uL (ref 0.0–0.1)
Basophils Relative: 1 %
Eosinophils Absolute: 0.2 K/uL (ref 0.0–0.5)
Eosinophils Relative: 3 %
HCT: 35.4 % — ABNORMAL LOW (ref 39.0–52.0)
Hemoglobin: 10.5 g/dL — ABNORMAL LOW (ref 13.0–17.0)
Immature Granulocytes: 0 %
Lymphocytes Relative: 8 %
Lymphs Abs: 0.6 K/uL — ABNORMAL LOW (ref 0.7–4.0)
MCH: 26.9 pg (ref 26.0–34.0)
MCHC: 29.7 g/dL — ABNORMAL LOW (ref 30.0–36.0)
MCV: 90.8 fL (ref 80.0–100.0)
Monocytes Absolute: 0.6 K/uL (ref 0.1–1.0)
Monocytes Relative: 8 %
Neutro Abs: 5.2 K/uL (ref 1.7–7.7)
Neutrophils Relative %: 80 %
Platelets: 166 K/uL (ref 150–400)
RBC: 3.9 MIL/uL — ABNORMAL LOW (ref 4.22–5.81)
RDW: 16.5 % — ABNORMAL HIGH (ref 11.5–15.5)
WBC: 6.7 K/uL (ref 4.0–10.5)
nRBC: 0 % (ref 0.0–0.2)

## 2024-06-02 LAB — GLUCOSE, CAPILLARY
Glucose-Capillary: 184 mg/dL — ABNORMAL HIGH (ref 70–99)
Glucose-Capillary: 126 mg/dL — ABNORMAL HIGH (ref 70–99)
Glucose-Capillary: 97 mg/dL (ref 70–99)
Glucose-Capillary: 136 mg/dL — ABNORMAL HIGH (ref 70–99)
Glucose-Capillary: 214 mg/dL — ABNORMAL HIGH (ref 70–99)
Glucose-Capillary: 90 mg/dL (ref 70–99)

## 2024-06-02 LAB — PROCALCITONIN: Procalcitonin: 0.3 ng/mL

## 2024-06-02 MED ORDER — SODIUM ZIRCONIUM CYCLOSILICATE 10 G PO PACK
10.0000 g | PACK | Freq: Two times a day (BID) | ORAL | Status: AC
Start: 2024-06-02 — End: 2024-06-02
  Administered 2024-06-02 (×2): 10 g via ORAL
  Filled 2024-06-02 (×2): qty 1

## 2024-06-02 NOTE — Progress Notes (Signed)
 PROGRESS NOTE                                                                                                                                                                                                             Patient Demographics:    Clarence Maldonado, is a 78 y.o. male, DOB - March 16, 1946, FMW:[id number]  Outpatient Primary MD for the patient is Gil Greig BRAVO, NP    LOS - 21  Admit date - 05/12/2024    Chief Complaint  Patient presents with   Respiratory Distress       Brief Narrative (HPI from H&P)    78 year old man who presented to [facility] 8/8 for acute respiratory failure. PMHx significant for HTN, HLD, COPD, CKD 3b, DMT2, BPH, R AKA, anxiety/depression.   Patient resides at rehab facility. Having SOB on 8/8 and prescribed rocephin /azithro. Worsening SOB. EMS called found in severe resp distress unresponsive. LMA placed and being bagged en route to [facility] ED. Wheezing b/l. Patient intubated on arrival, he was kept in ICU, his workup was suggestive of metapneumovirus pneumonia, sepsis, he was further diagnosed with perineal abscess, drained by urology.  He was seen by PCCM, urology, ID.  He was stabilized and transferred to my care on 05/28/2024 on day 16 of his hospital stay.    Still has a NG tube, still appears extremely weak dehydrated and deconditioned, extremely poor cough reflex.    Significant Hospital Events:    8/8 - Admit w/ COPD, HMPV infection, intubated. 8/13 - Weaning on vent, PSV 10/5, mild tachypnea. Remains on Precedex . 8/15 Developed perineal abscess.  Underwent cystoscopy and I&D with purulent drainage, wound packed 8/16 Failing PSV, on low dose levoped 8/19: failed SBT, off vasopressors. Treated for hyperkalemia 8/20: tol PS wean  05/25/2024 extubating 05/28/2024.  Transferred to [facility]     Subjective:   Patient in bed, appears comfortable, denies any headache, no fever, no chest pain or  pressure, no shortness of breath , no abdominal pain. No new focal weakness.    Assessment  & Plan :    Sepsis upon admission due to combination of metapneumovirus and Pseudomonas pneumonia, perineal abscess with Candida glabrata - he is initially admitted to ICU and intubated, seen by urology, ID.  He underwent incision and drainage by urology on 05/19/2024.  Currently he has finished his  antibiotics & micafungin , urology assisting with wound care for his perineal abscess, still quite weak and deconditioned monitor closely.  Metapneumovirus pneumonia, weak cough reflex, at risk for aspiration, dysphagia.  Currently on tube feeds along with free water  flushes, cough reflex quite poor, have requested speech therapy to evaluate, okay remains at risk for aspiration pneumonia monitor closely.  Discussed with RT on 05/31/2024 he will require continued chest PT, NT suction and as needed Mucomyst  as he still has quite a bit of coarse breath sounds due to airway secretions.  I-S and flutter valve also on board.  Acute encephalopathy  Due to above and mild hypercapnia.  No headache or focal deficits, as needed Haldol , nighttime Seroquel , BiPAP for a few hours.  Discussed with pulmonary Dr. Claudene on 05/30/2024.  Continue to monitor.    Severe dehydration with AKI - CKD 3B and hypernatremia.  AKI gradually improving, continue IV fluids and free water  flushes baseline creatinine appears to be close to 2, poor oral intake adjusted IV fluids again on 05/31/2024   Hyperkalemia.  Treated.    Dysphagia due to above.  In by speech therapy NG tube discontinued on 05/28/2024 evening, now on oral diet dysphagia 1.  Monitor  Chronic diastolic CHF.  EF around 70% this admission on 05/14/2024.  Currently compensated.  Continue beta-blocker.    BPH - cont foley, will order for his home flomax  to start 8/24   HTN  -BID metop, PRN hydral   DM2  -oral intake is erratic, discontinue long-acting insulin  completely on 05/31/2024  as he is having episodes of severe hypoglycemia, sliding scale for now.    Lab Results  Component Value Date   HGBA1C See Scanned report in [place] Link 05/12/2024   CBG (last 3)  Recent Labs    06/02/24 0023 06/02/24 0443 06/02/24 0816  GLUCAP 184* 126* 97           Condition - Extremely Guarded  Family Communication  :   Alyse Rush [phone] updated on 05/30/2024    Daughter [name] [phone]  on 05/28/2024 at 11 AM, message left again on 05/30/2024 at 11:20 AM.   Code Status : Full code   Consults  : PCCM, ID, urology  PUD Prophylaxis : PPI   Procedures  :            Disposition Plan  :    Status is: Inpatient   DVT Prophylaxis  :    heparin  injection 5,000 Units Start: 05/20/24 0600 SCDs Start: 05/12/24 1851   Lab Results  Component Value Date   PLT 166 06/02/2024    Diet :  Diet Order             DIET DYS 3 Room service appropriate? Yes; Fluid consistency: Thin  Diet effective now                    Inpatient Medications  Scheduled Meds:  arformoterol   15 mcg Nebulization BID   atorvastatin   40 mg Oral Daily   budesonide  (PULMICORT ) nebulizer solution  0.5 mg Nebulization BID   Chlorhexidine  Gluconate Cloth  6 each Topical Daily   doxazosin   4 mg Oral Daily   feeding supplement  237 mL Oral TID BM   heparin  injection (subcutaneous)  5,000 Units Subcutaneous Q8H   insulin  aspart  0-9 Units Subcutaneous TID WC   multivitamin with minerals  1 tablet Oral Daily   mouth rinse  15 mL Mouth Rinse 4 times per  day   pantoprazole   40 mg Oral Daily   QUEtiapine   25 mg Oral QHS   revefenacin   175 mcg Nebulization Daily   sodium zirconium cyclosilicate   10 g Oral BID   Continuous Infusions:   PRN Meds:.acetaminophen , albuterol , docusate, fentaNYL  (SUBLIMAZE ) injection, haloperidol  lactate, metoprolol  tartrate, mouth rinse, polyethylene glycol    Objective:   Vitals:   06/02/24 0119 06/02/24 0441 06/02/24 0454 06/02/24 0810   BP:  (!) 142/71  91/62  Pulse: (!) 106 (!) 106  (!) 108  Resp: 17 20  20   Temp:  (!) 97.2 F (36.2 C)    TempSrc:  Oral    SpO2: 98% 94%  97%  Weight:   59.4 kg   Height:        Wt Readings from Last 3 Encounters:  06/02/24 59.4 kg  10/28/23 70.3 kg  10/14/23 71.2 kg     Intake/Output Summary (Last 24 hours) at 06/02/2024 9176 Last data filed at 06/02/2024 0500 Gross per 24 hour  Intake --  Output 1575 ml  Net -1575 ml     Physical Exam  Frail elderly male, awake much less confused , no new F.N deficits,   [place].AT,PERRAL Supple Neck, No JVD,   Symmetrical Chest wall movement, Good air movement bilaterally, coarse bilateral breath sounds RRR,No Gallops,Rubs or new Murmurs,  +ve B.Sounds, Abd Soft, No tenderness,   No Cyanosis, Foley catheter in place, right AKA       Data Review:    Recent Labs  Lab 05/29/24 0615 05/30/24 0546 05/31/24 0517 06/01/24 0639 06/02/24 0559  WBC 10.6* 12.2* 8.9 6.4 6.7  HGB 11.0* 11.5* 10.8* 10.2* 10.5*  HCT 35.8* 38.7* 35.5* 34.2* 35.4*  PLT 224 225 182 147* 166  MCV 87.5 89.4 88.3 89.5 90.8  MCH 26.9 26.6 26.9 26.7 26.9  MCHC 30.7 29.7* 30.4 29.8* 29.7*  RDW 16.2* 16.2* 16.4* 16.6* 16.5*  LYMPHSABS 0.8 0.5* 1.0 0.8 0.6*  MONOABS 0.6 0.6 0.6 0.5 0.6  EOSABS 0.3 0.2 0.3 0.2 0.2  BASOSABS 0.0 0.1 0.0 0.0 0.1    Recent Labs  Lab 05/27/24 0418 05/27/24 0418 05/28/24 0601 05/28/24 0602 05/29/24 0615 05/30/24 0546 05/31/24 0517 06/01/24 0639 06/02/24 0559  NA 151*  --   --  149* 144 144 146* 143 143  K 4.5  --   --  4.3 4.5 4.4 4.8 5.3* 5.1  CL 105  --   --  101 105 105 107 107 105  CO2 36*  --   --  34* 32 30 29 31 28   ANIONGAP 10  --   --  14 7 9 10 5 10   GLUCOSE 94  --   --  131* 79 93 36* 78 118*  BUN 106*  --   --  97* 92* 88* 77* 59* 55*  CREATININE 2.15*  --   --  1.94* 1.75* 1.79* 1.68* 1.68* 1.88*  AST  --   --   --   --  18 23 21 23 25   ALT  --   --   --   --  18 23 25 27  32  ALKPHOS  --   --   --   --   50 62 63 66 78  BILITOT  --   --   --   --  0.9 0.7 0.8 0.8 0.8  ALBUMIN  --   --   --   --  1.9* 2.2* 2.1* 2.0* 2.0*  CRP  --   --   --  3.2* 2.4* 3.2* 5.7* 11.2*  --   PROCALCITON  --    < >  --  0.40 0.43 0.46 0.50 0.39 0.30  INR  --   --  1.0  --   --   --   --   --   --   MG 2.2  --   --  2.3 2.0 2.0 2.1 1.9  --   PHOS 6.2*  --   --   --  5.4* 4.6 4.1 3.5  --   CALCIUM  8.3*  --   --  8.7* 8.4* 8.7* 8.6* 8.5* 8.6*   < > = values in this interval not displayed.      Recent Labs  Lab 05/27/24 0418 05/28/24 0601 05/28/24 0602 05/29/24 0615 05/30/24 0546 05/31/24 0517 06/01/24 0639 06/02/24 0559  CRP  --   --  3.2* 2.4* 3.2* 5.7* 11.2*  --   PROCALCITON   < >  --  0.40 0.43 0.46 0.50 0.39 0.30  INR  --  1.0  --   --   --   --   --   --   MG  --   --  2.3 2.0 2.0 2.1 1.9  --   CALCIUM   --   --  8.7* 8.4* 8.7* 8.6* 8.5* 8.6*   < > = values in this interval not displayed.    --------------------------------------------------------------------------------------------------------------- Lab Results  Component Value Date   CHOL 82 05/12/2024   HDL 27 (L) 05/12/2024   LDLCALC 39 05/12/2024   TRIG 61 05/25/2024   CHOLHDL 3.0 05/12/2024    Lab Results  Component Value Date   HGBA1C See Scanned report in [place] Link 05/12/2024     Signature  -   Lavada Stank M.D on 06/02/2024 at 8:23 AM   -  To page go to [email or web address]

## 2024-06-02 NOTE — Plan of Care (Signed)
  Problem: Activity: Goal: Ability to tolerate increased activity will improve Outcome: Progressing   Problem: Role Relationship: Goal: Method of communication will improve Outcome: Progressing   Problem: Education: Goal: Knowledge of General Education information will improve Description: Including pain rating scale, medication(s)/side effects and non-pharmacologic comfort measures Outcome: Progressing   Problem: Clinical Measurements: Goal: Will remain free from infection Outcome: Progressing Goal: Respiratory complications will improve Outcome: Progressing Goal: Cardiovascular complication will be avoided Outcome: Progressing   Problem: Activity: Goal: Risk for activity intolerance will decrease Outcome: Progressing   Problem: Nutrition: Goal: Adequate nutrition will be maintained Outcome: Progressing   Problem: Coping: Goal: Level of anxiety will decrease Outcome: Progressing

## 2024-06-02 NOTE — Plan of Care (Signed)
  Problem: Activity: Goal: Ability to tolerate increased activity will improve Outcome: Progressing   Problem: Respiratory: Goal: Ability to maintain a clear airway and adequate ventilation will improve Outcome: Progressing   Problem: Role Relationship: Goal: Method of communication will improve Outcome: Progressing   Problem: Education: Goal: Knowledge of General Education information will improve Description: Including pain rating scale, medication(s)/side effects and non-pharmacologic comfort measures Outcome: Progressing   Problem: Health Behavior/Discharge Planning: Goal: Ability to manage health-related needs will improve Outcome: Progressing   Problem: Clinical Measurements: Goal: Ability to maintain clinical measurements within normal limits will improve Outcome: Progressing Goal: Will remain free from infection Outcome: Progressing Goal: Diagnostic test results will improve Outcome: Progressing Goal: Respiratory complications will improve Outcome: Progressing Goal: Cardiovascular complication will be avoided Outcome: Progressing   Problem: Activity: Goal: Risk for activity intolerance will decrease Outcome: Progressing   Problem: Nutrition: Goal: Adequate nutrition will be maintained Outcome: Progressing   Problem: Coping: Goal: Level of anxiety will decrease Outcome: Progressing   Problem: Elimination: Goal: Will not experience complications related to bowel motility Outcome: Progressing Goal: Will not experience complications related to urinary retention Outcome: Progressing   Problem: Pain Managment: Goal: General experience of comfort will improve and/or be controlled Outcome: Progressing   Problem: Safety: Goal: Ability to remain free from injury will improve Outcome: Progressing   Problem: Skin Integrity: Goal: Risk for impaired skin integrity will decrease Outcome: Progressing   Problem: Safety: Goal: Non-violent Restraint(s) Outcome:  Progressing   Problem: Education: Goal: Ability to describe self-care measures that may prevent or decrease complications (Diabetes Survival Skills Education) will improve Outcome: Progressing Goal: Individualized Educational Video(s) Outcome: Progressing   Problem: Coping: Goal: Ability to adjust to condition or change in health will improve Outcome: Progressing   Problem: Fluid Volume: Goal: Ability to maintain a balanced intake and output will improve Outcome: Progressing   Problem: Health Behavior/Discharge Planning: Goal: Ability to identify and utilize available resources and services will improve Outcome: Progressing Goal: Ability to manage health-related needs will improve Outcome: Progressing   Problem: Metabolic: Goal: Ability to maintain appropriate glucose levels will improve Outcome: Progressing   Problem: Nutritional: Goal: Maintenance of adequate nutrition will improve Outcome: Progressing Goal: Progress toward achieving an optimal weight will improve Outcome: Progressing   Problem: Skin Integrity: Goal: Risk for impaired skin integrity will decrease Outcome: Progressing   Problem: Tissue Perfusion: Goal: Adequacy of tissue perfusion will improve Outcome: Progressing

## 2024-06-02 NOTE — TOC Progression Note (Signed)
 Transition of Care [facility]) - Progression Note    Patient Details  Name: Clarence Maldonado MRN: [id number] Date of Birth: Aug 27, 1946  Transition of Care [facility]) CM/SW Contact  Isaiah Public, LCSWA Phone Number: 06/02/2024, 12:34 PM  Clinical Narrative:     CSW continues to follow. Patient from [facility].   Expected Discharge Plan: Long Term Nursing Home (From [facility]) Barriers to Discharge: Continued Medical Work up               Expected Discharge Plan and Services In-house Referral: Clinical Social Work     Living arrangements for the past 2 months: Skilled Nursing Facility                                       Social Drivers of Health (SDOH) Interventions SDOH Screenings   Food Insecurity: No Food Insecurity (10/20/2023)  Housing: Unknown (10/20/2023)  Transportation Needs: No Transportation Needs (10/20/2023)  Utilities: Not At Risk (10/20/2023)  Depression (PHQ2-9): Low Risk  (10/14/2023)  Financial Resource Strain: Low Risk  (03/23/2019)  Physical Activity: Inactive (03/23/2019)  Social Connections: Socially Isolated (03/23/2019)  Stress: No Stress Concern Present (03/23/2019)  Tobacco Use: Medium Risk (05/19/2024)    Readmission Risk Interventions     No data to display

## 2024-06-02 NOTE — Progress Notes (Addendum)
 Physical Therapy Treatment Patient Details Name: Clarence Maldonado MRN: [id number] DOB: 12/16/1945 Today's Date: 06/02/2024   History of Present Illness 78 year old man who presented via EMS 8/8 for acute respiratory failure. EMS found in severe resp distress unresponsive. Pt intubated on arrival to [facility] and taken to ICU. Admitted for treatment of COPD, and HMPV infection. Developed perineal abscess, 8/15 underwent cystoscopy with I&D. Extubated 8/21  PMHx significant for HTN, HLD, COPD, CKD 3b, DMT2, BPH, R AKA, anxiety/depression.    PT Comments  Pt with poor tolerance to treatment today. Pt able to sit EOB with +2 Mod A however appeared to be very out of it today. Pt HR also up to 137 today with movement and RN made aware. Transfer to chair today deferred due to HR response and pt fatigue. No change in DC/DME recs at this time. PT will continue to follow.     If plan is discharge home, recommend the following: A lot of help with walking and/or transfers;A lot of help with bathing/dressing/bathroom;Assistance with cooking/housework;Direct supervision/assist for medications management;Direct supervision/assist for financial management;Assist for transportation;Help with stairs or ramp for entrance;Supervision due to cognitive status   Can travel by private vehicle     No  Equipment Recommendations  Rolling walker (2 wheels)    Recommendations for Other Services       Precautions / Restrictions Precautions Precautions: Fall Recall of Precautions/Restrictions: Impaired Restrictions Weight Bearing Restrictions Per Provider Order: No     Mobility  Bed Mobility Overal bed mobility: Needs Assistance Bed Mobility: Supine to Sit, Sit to Supine     Supine to sit: +2 for physical assistance, Mod assist Sit to supine: Contact guard assist   General bed mobility comments: +2 Mod A for trunk elevation.    Transfers                   General transfer comment: Deferred due to HR  response and pt fatigue.    Ambulation/Gait               General Gait Details: deferred   Stairs             Wheelchair Mobility     Tilt Bed    Modified Rankin (Stroke Patients Only)       Balance Overall balance assessment: Needs assistance Sitting-balance support: Feet unsupported, Bilateral upper extremity supported Sitting balance-Leahy Scale: Poor Sitting balance - Comments: L lateral lean but able to correct with assist Postural control: Left lateral lean                                  Communication Communication Communication: Impaired Factors Affecting Communication: Difficulty expressing self;Reduced clarity of speech  Cognition Arousal: Alert Behavior During Therapy: Flat affect   PT - Cognitive impairments: No family/caregiver present to determine baseline, Difficult to assess, Orientation, Awareness, Memory, Sequencing, Problem solving, Attention, Safety/Judgement Difficult to assess due to: Impaired communication                     PT - Cognition Comments: Pt appears to be easily distracted by many things. Tends to ramble however was able to follow commands more than previous session. Following commands: Impaired Following commands impaired: Follows one step commands with increased time, Follows one step commands inconsistently    Cueing Cueing Techniques: Verbal cues, Tactile cues, Visual cues  Exercises General Exercises - Lower Extremity Long Arc  Quad: Left, 15 reps, Seated    General Comments General comments (skin integrity, edema, etc.): HR up to 137 sitting EOB. pt reported dizziness however BP was 138/68 seated.      Pertinent Vitals/Pain Pain Assessment Pain Assessment: Faces Faces Pain Scale: Hurts a little bit Pain Location: generalized with movement Pain Descriptors / Indicators: Discomfort, Grimacing Pain Intervention(s): Monitored during session, Limited activity within patient's tolerance,  Repositioned    Home Living                          Prior Function            PT Goals (current goals can now be found in the care plan section) Progress towards PT goals: Progressing toward goals    Frequency    Min 2X/week      PT Plan      Co-evaluation              AM-PAC PT 6 Clicks Mobility   Outcome Measure  Help needed turning from your back to your side while in a flat bed without using bedrails?: A Lot Help needed moving from lying on your back to sitting on the side of a flat bed without using bedrails?: A Lot Help needed moving to and from a bed to a chair (including a wheelchair)?: A Lot Help needed standing up from a chair using your arms (e.g., wheelchair or bedside chair)?: A Lot Help needed to walk in hospital room?: Total Help needed climbing 3-5 steps with a railing? : Total 6 Click Score: 10    End of Session Equipment Utilized During Treatment: Oxygen  Activity Tolerance: Patient tolerated treatment well Patient left: in bed;with call bell/phone within reach;with bed alarm set Nurse Communication: Mobility status;Need for lift equipment PT Visit Diagnosis: Difficulty in walking, not elsewhere classified (R26.2);Muscle weakness (generalized) (M62.81);Unsteadiness on feet (R26.81);Other symptoms and signs involving the nervous system (R29.898);Adult, failure to thrive (R62.7)     Time: 8666-8654 PT Time Calculation (min) (ACUTE ONLY): 12 min  Charges:    $Therapeutic Activity: 8-22 mins PT General Charges $$ ACUTE PT VISIT: 1 Visit                     Sueellen NOVAK, PT, DPT Acute Rehab Services [phone]    Virgle Arth 06/02/2024, 3:02 PM

## 2024-06-03 ENCOUNTER — Inpatient Hospital Stay (HOSPITAL_COMMUNITY)

## 2024-06-03 DIAGNOSIS — R918 Other nonspecific abnormal finding of lung field: Secondary | ICD-10-CM | POA: Diagnosis not present

## 2024-06-03 DIAGNOSIS — J439 Emphysema, unspecified: Secondary | ICD-10-CM | POA: Diagnosis not present

## 2024-06-03 DIAGNOSIS — I700 Atherosclerosis of aorta: Secondary | ICD-10-CM | POA: Diagnosis not present

## 2024-06-03 DIAGNOSIS — R0602 Shortness of breath: Secondary | ICD-10-CM | POA: Diagnosis not present

## 2024-06-03 DIAGNOSIS — J9602 Acute respiratory failure with hypercapnia: Secondary | ICD-10-CM | POA: Diagnosis not present

## 2024-06-03 LAB — CBC WITH DIFFERENTIAL/PLATELET
Abs Immature Granulocytes: 0.03 K/uL (ref 0.00–0.07)
Basophils Absolute: 0.1 K/uL (ref 0.0–0.1)
Basophils Relative: 1 %
Eosinophils Absolute: 0.2 K/uL (ref 0.0–0.5)
Eosinophils Relative: 3 %
HCT: 33 % — ABNORMAL LOW (ref 39.0–52.0)
Hemoglobin: 9.9 g/dL — ABNORMAL LOW (ref 13.0–17.0)
Immature Granulocytes: 1 %
Lymphocytes Relative: 12 %
Lymphs Abs: 0.8 K/uL (ref 0.7–4.0)
MCH: 27 pg (ref 26.0–34.0)
MCHC: 30 g/dL (ref 30.0–36.0)
MCV: 89.9 fL (ref 80.0–100.0)
Monocytes Absolute: 0.6 K/uL (ref 0.1–1.0)
Monocytes Relative: 9 %
Neutro Abs: 4.9 K/uL (ref 1.7–7.7)
Neutrophils Relative %: 74 %
Platelets: 170 K/uL (ref 150–400)
RBC: 3.67 MIL/uL — ABNORMAL LOW (ref 4.22–5.81)
RDW: 16.9 % — ABNORMAL HIGH (ref 11.5–15.5)
WBC: 6.6 K/uL (ref 4.0–10.5)
nRBC: 0 % (ref 0.0–0.2)

## 2024-06-03 LAB — GLUCOSE, CAPILLARY
Glucose-Capillary: 102 mg/dL — ABNORMAL HIGH (ref 70–99)
Glucose-Capillary: 133 mg/dL — ABNORMAL HIGH (ref 70–99)
Glucose-Capillary: 136 mg/dL — ABNORMAL HIGH (ref 70–99)
Glucose-Capillary: 154 mg/dL — ABNORMAL HIGH (ref 70–99)
Glucose-Capillary: 171 mg/dL — ABNORMAL HIGH (ref 70–99)
Glucose-Capillary: 192 mg/dL — ABNORMAL HIGH (ref 70–99)
Glucose-Capillary: 92 mg/dL (ref 70–99)

## 2024-06-03 LAB — BASIC METABOLIC PANEL WITH GFR
Anion gap: 8 (ref 5–15)
BUN: 46 mg/dL — ABNORMAL HIGH (ref 8–23)
CO2: 31 mmol/L (ref 22–32)
Calcium: 8.5 mg/dL — ABNORMAL LOW (ref 8.9–10.3)
Chloride: 104 mmol/L (ref 98–111)
Creatinine, Ser: 1.85 mg/dL — ABNORMAL HIGH (ref 0.61–1.24)
GFR, Estimated: 37 mL/min — ABNORMAL LOW (ref >60)
Glucose, Bld: 102 mg/dL — ABNORMAL HIGH (ref 70–99)
Potassium: 4.2 mmol/L (ref 3.5–5.1)
Sodium: 143 mmol/L (ref 135–145)

## 2024-06-03 LAB — MAGNESIUM: Magnesium: 1.9 mg/dL (ref 1.7–2.4)

## 2024-06-03 NOTE — Progress Notes (Signed)
 RT attempted to do patients nebulizer treatments and chest vest. Patient will not keep treatment modalities on. Patient states he just wants to die. Patient cough congested when RT at bedside. RT will continue to monitor.

## 2024-06-03 NOTE — Plan of Care (Signed)
  Problem: Activity: Goal: Ability to tolerate increased activity will improve Outcome: Progressing   Problem: Clinical Measurements: Goal: Ability to maintain clinical measurements within normal limits will improve Outcome: Progressing Goal: Will remain free from infection Outcome: Progressing Goal: Diagnostic test results will improve Outcome: Progressing Goal: Respiratory complications will improve Outcome: Progressing Goal: Cardiovascular complication will be avoided Outcome: Progressing   Problem: Activity: Goal: Risk for activity intolerance will decrease Outcome: Progressing

## 2024-06-03 NOTE — Progress Notes (Signed)
   06/03/24 1418  Mobility  Activity Dangled on edge of bed  Level of Assistance Moderate assist, patient does 50-74% (+2)  Assistive Device None  Activity Response Tolerated fair  Mobility Referral Yes  Mobility visit 1 Mobility  Mobility Specialist Start Time (ACUTE ONLY) 1418  Mobility Specialist Stop Time (ACUTE ONLY) 1427  Mobility Specialist Time Calculation (min) (ACUTE ONLY) 9 min   Mobility Specialist: Progress Note  Pre-Mobility:      HR 102, SpO2 92% 3L During Mobility: HR 134, SpO2 85-92% 3L, BP 108/89 (97) Post-Mobility:    HR 112, SpO2 91% 3L  Pt agreeable to mobility session - received in bed. C/o dizziness, pt was very stiff as well-  RN notified. Returned to chair position in bed with all needs met - call bell within reach. Bed alarm on.   Virgle Boards, BS Mobility Specialist Please contact via SecureChat or  Rehab office at [phone].

## 2024-06-03 NOTE — Plan of Care (Signed)
   Problem: Activity: Goal: Ability to tolerate increased activity will improve Outcome: Not Progressing   Problem: Respiratory: Goal: Ability to maintain a clear airway and adequate ventilation will improve Outcome: Not Progressing   Problem: Role Relationship: Goal: Method of communication will improve Outcome: Not Progressing   Problem: Education: Goal: Knowledge of General Education information will improve Description: Including pain rating scale, medication(s)/side effects and non-pharmacologic comfort measures Outcome: Not Progressing   Problem: Health Behavior/Discharge Planning: Goal: Ability to manage health-related needs will improve Outcome: Not Progressing

## 2024-06-03 NOTE — Progress Notes (Signed)
 PROGRESS NOTE                                                                                                                                                                                                             Patient Demographics:    Clarence Maldonado, is a 78 y.o. male, DOB - 1946-05-10, FMW:[id number]  Outpatient Primary MD for the patient is Gil Greig BRAVO, NP    LOS - 22  Admit date - 05/12/2024    Chief Complaint  Patient presents with   Respiratory Distress       Brief Narrative (HPI from H&P)    78 year old man who presented to [facility] 8/8 for acute respiratory failure. PMHx significant for HTN, HLD, COPD, CKD 3b, DMT2, BPH, R AKA, anxiety/depression.   Patient resides at rehab facility. Having SOB on 8/8 and prescribed rocephin /azithro. Worsening SOB. EMS called found in severe resp distress unresponsive. LMA placed and being bagged en route to [facility] ED. Wheezing b/l. Patient intubated on arrival, he was kept in ICU, his workup was suggestive of metapneumovirus pneumonia, sepsis, he was further diagnosed with perineal abscess, drained by urology.  He was seen by PCCM, urology, ID.  He was stabilized and transferred to my care on 05/28/2024 on day 16 of his hospital stay.    Still has a NG tube, still appears extremely weak dehydrated and deconditioned, extremely poor cough reflex.    Significant Hospital Events:    8/8 - Admit w/ COPD, HMPV infection, intubated. 8/13 - Weaning on vent, PSV 10/5, mild tachypnea. Remains on Precedex . 8/15 Developed perineal abscess.  Underwent cystoscopy and I&D with purulent drainage, wound packed 8/16 Failing PSV, on low dose levoped 8/19: failed SBT, off vasopressors. Treated for hyperkalemia 8/20: tol PS wean  05/25/2024 extubating 05/28/2024.  Transferred to [facility]     Subjective:   Patient in bed, appears comfortable, denies any headache, no fever, no chest pain or  pressure, no shortness of breath , no abdominal pain. No new focal weakness.   Assessment  & Plan :    Sepsis upon admission due to combination of metapneumovirus and Pseudomonas pneumonia, perineal abscess with Candida glabrata - he is initially admitted to ICU and intubated, seen by urology, ID.  He underwent incision and drainage by urology on 05/19/2024.  Currently he has finished his antibiotics &  micafungin , urology assisting with wound care for his perineal abscess, still quite weak and deconditioned monitor closely.  Metapneumovirus pneumonia, weak cough reflex, at risk for aspiration, dysphagia.  Currently on tube feeds along with free water  flushes, cough reflex quite poor, have requested speech therapy to evaluate, okay remains at risk for aspiration pneumonia monitor closely.  Discussed with RT on 05/31/2024 he will require continued chest PT, NT suction and as needed Mucomyst  as he still has quite a bit of coarse breath sounds due to airway secretions.  I-S and flutter valve also on board.  Acute encephalopathy  Due to above and mild hypercapnia.  No headache or focal deficits, as needed Haldol , nighttime Seroquel , BiPAP for a few hours.  Discussed with pulmonary Dr. Claudene on 05/30/2024.  Continue to monitor.    Severe dehydration with AKI - CKD 3B and hypernatremia.  AKI gradually improving, continue IV fluids and free water  flushes baseline creatinine appears to be close to 2, poor oral intake adjusted IV fluids again on 05/31/2024   Hyperkalemia.  Treated.    Dysphagia due to above.  In by speech therapy NG tube discontinued on 05/28/2024 evening, now on oral diet dysphagia 1.  Monitor  Chronic diastolic CHF.  EF around 70% this admission on 05/14/2024.  Currently compensated.  Continue beta-blocker.    BPH - cont foley, will order for his home flomax  to start 8/24   HTN  -BID metop, PRN hydral   DM2  -oral intake is erratic, discontinue long-acting insulin  completely on 05/31/2024 as  he is having episodes of severe hypoglycemia, sliding scale for now.    Lab Results  Component Value Date   HGBA1C See Scanned report in [place] Link 05/12/2024   CBG (last 3)  Recent Labs    06/02/24 1954 06/03/24 0008 06/03/24 0418  GLUCAP 90 192* 154*           Condition - Extremely Guarded  Family Communication  :   Alyse Rush [phone] updated on 05/30/2024    Daughter [name] [phone]  on 05/28/2024 at 11 AM, message left again on 05/30/2024 at 11:20 AM.  Updated in patient's room on 05/31/2024   Code Status : Full code   Consults  : PCCM, ID, urology  PUD Prophylaxis : PPI   Procedures  :            Disposition Plan  :    Status is: Inpatient   DVT Prophylaxis  :    heparin  injection 5,000 Units Start: 05/20/24 0600 SCDs Start: 05/12/24 1851   Lab Results  Component Value Date   PLT 170 06/03/2024    Diet :  Diet Order             DIET DYS 3 Room service appropriate? Yes; Fluid consistency: Thin  Diet effective now                    Inpatient Medications  Scheduled Meds:  arformoterol   15 mcg Nebulization BID   atorvastatin   40 mg Oral Daily   budesonide  (PULMICORT ) nebulizer solution  0.5 mg Nebulization BID   Chlorhexidine  Gluconate Cloth  6 each Topical Daily   doxazosin   4 mg Oral Daily   feeding supplement  237 mL Oral TID BM   heparin  injection (subcutaneous)  5,000 Units Subcutaneous Q8H   insulin  aspart  0-9 Units Subcutaneous TID WC   multivitamin with minerals  1 tablet Oral Daily   mouth rinse  15 mL  Mouth Rinse 4 times per day   pantoprazole   40 mg Oral Daily   QUEtiapine   25 mg Oral QHS   revefenacin   175 mcg Nebulization Daily   Continuous Infusions:   PRN Meds:.acetaminophen , albuterol , docusate, fentaNYL  (SUBLIMAZE ) injection, haloperidol  lactate, metoprolol  tartrate, mouth rinse, polyethylene glycol    Objective:   Vitals:   06/02/24 2004 06/03/24 0012 06/03/24 0417 06/03/24 0530  BP:  (!)  153/84 (!) 153/69   Pulse: (!) 105 (!) 106    Resp: 18 19    Temp:  98.2 F (36.8 C) 97.6 F (36.4 C)   TempSrc:  Oral Oral   SpO2: 96% 94%    Weight:    62.4 kg  Height:        Wt Readings from Last 3 Encounters:  06/03/24 62.4 kg  10/28/23 70.3 kg  10/14/23 71.2 kg     Intake/Output Summary (Last 24 hours) at 06/03/2024 0744 Last data filed at 06/03/2024 0417 Gross per 24 hour  Intake --  Output 1250 ml  Net -1250 ml     Physical Exam  Frail elderly male, awake much less confused , no new F.N deficits,   [place].AT,PERRAL Supple Neck, No JVD,   Symmetrical Chest wall movement, Good air movement bilaterally, coarse bilateral breath sounds RRR,No Gallops,Rubs or new Murmurs,  +ve B.Sounds, Abd Soft, No tenderness,   No Cyanosis, Foley catheter in place, right AKA       Data Review:    Recent Labs  Lab 05/30/24 0546 05/31/24 0517 06/01/24 0639 06/02/24 0559 06/03/24 0641  WBC 12.2* 8.9 6.4 6.7 6.6  HGB 11.5* 10.8* 10.2* 10.5* 9.9*  HCT 38.7* 35.5* 34.2* 35.4* 33.0*  PLT 225 182 147* 166 170  MCV 89.4 88.3 89.5 90.8 89.9  MCH 26.6 26.9 26.7 26.9 27.0  MCHC 29.7* 30.4 29.8* 29.7* 30.0  RDW 16.2* 16.4* 16.6* 16.5* 16.9*  LYMPHSABS 0.5* 1.0 0.8 0.6* 0.8  MONOABS 0.6 0.6 0.5 0.6 0.6  EOSABS 0.2 0.3 0.2 0.2 0.2  BASOSABS 0.1 0.0 0.0 0.1 0.1    Recent Labs  Lab 05/28/24 0601 05/28/24 0602 05/28/24 0602 05/29/24 0615 05/30/24 0546 05/31/24 0517 06/01/24 0639 06/02/24 0559  NA  --  149*   < > 144 144 146* 143 143  K  --  4.3   < > 4.5 4.4 4.8 5.3* 5.1  CL  --  101   < > 105 105 107 107 105  CO2  --  34*   < > 32 30 29 31 28   ANIONGAP  --  14   < > 7 9 10 5 10   GLUCOSE  --  131*   < > 79 93 36* 78 118*  BUN  --  97*   < > 92* 88* 77* 59* 55*  CREATININE  --  1.94*   < > 1.75* 1.79* 1.68* 1.68* 1.88*  AST  --   --   --  18 23 21 23 25   ALT  --   --   --  18 23 25 27  32  ALKPHOS  --   --   --  50 62 63 66 78  BILITOT  --   --   --  0.9 0.7 0.8 0.8 0.8   ALBUMIN  --   --   --  1.9* 2.2* 2.1* 2.0* 2.0*  CRP  --  3.2*  --  2.4* 3.2* 5.7* 11.2*  --   PROCALCITON  --  0.40   < >  0.43 0.46 0.50 0.39 0.30  INR 1.0  --   --   --   --   --   --   --   MG  --  2.3  --  2.0 2.0 2.1 1.9  --   PHOS  --   --   --  5.4* 4.6 4.1 3.5  --   CALCIUM   --  8.7*   < > 8.4* 8.7* 8.6* 8.5* 8.6*   < > = values in this interval not displayed.      Recent Labs  Lab 05/28/24 0601 05/28/24 0602 05/28/24 0602 05/29/24 0615 05/30/24 0546 05/31/24 0517 06/01/24 0639 06/02/24 0559  CRP  --  3.2*  --  2.4* 3.2* 5.7* 11.2*  --   PROCALCITON  --  0.40   < > 0.43 0.46 0.50 0.39 0.30  INR 1.0  --   --   --   --   --   --   --   MG  --  2.3  --  2.0 2.0 2.1 1.9  --   CALCIUM   --  8.7*   < > 8.4* 8.7* 8.6* 8.5* 8.6*   < > = values in this interval not displayed.    --------------------------------------------------------------------------------------------------------------- Lab Results  Component Value Date   CHOL 82 05/12/2024   HDL 27 (L) 05/12/2024   LDLCALC 39 05/12/2024   TRIG 61 05/25/2024   CHOLHDL 3.0 05/12/2024    Lab Results  Component Value Date   HGBA1C See Scanned report in [place] Link 05/12/2024     Signature  -   Lavada Stank M.D on 06/03/2024 at 7:44 AM   -  To page go to [email or web address]

## 2024-06-04 DIAGNOSIS — J9602 Acute respiratory failure with hypercapnia: Secondary | ICD-10-CM | POA: Diagnosis not present

## 2024-06-04 LAB — CBC WITH DIFFERENTIAL/PLATELET
Abs Immature Granulocytes: 0.03 K/uL (ref 0.00–0.07)
Basophils Absolute: 0.1 K/uL (ref 0.0–0.1)
Basophils Relative: 1 %
Eosinophils Absolute: 0.2 K/uL (ref 0.0–0.5)
Eosinophils Relative: 4 %
HCT: 32.8 % — ABNORMAL LOW (ref 39.0–52.0)
Hemoglobin: 9.8 g/dL — ABNORMAL LOW (ref 13.0–17.0)
Immature Granulocytes: 1 %
Lymphocytes Relative: 14 %
Lymphs Abs: 0.9 K/uL (ref 0.7–4.0)
MCH: 26.8 pg (ref 26.0–34.0)
MCHC: 29.9 g/dL — ABNORMAL LOW (ref 30.0–36.0)
MCV: 89.9 fL (ref 80.0–100.0)
Monocytes Absolute: 0.7 K/uL (ref 0.1–1.0)
Monocytes Relative: 11 %
Neutro Abs: 4.4 K/uL (ref 1.7–7.7)
Neutrophils Relative %: 69 %
Platelets: 166 K/uL (ref 150–400)
RBC: 3.65 MIL/uL — ABNORMAL LOW (ref 4.22–5.81)
RDW: 17.2 % — ABNORMAL HIGH (ref 11.5–15.5)
WBC: 6.3 K/uL (ref 4.0–10.5)
nRBC: 0 % (ref 0.0–0.2)

## 2024-06-04 LAB — BASIC METABOLIC PANEL WITH GFR
Anion gap: 9 (ref 5–15)
BUN: 39 mg/dL — ABNORMAL HIGH (ref 8–23)
CO2: 28 mmol/L (ref 22–32)
Calcium: 8.5 mg/dL — ABNORMAL LOW (ref 8.9–10.3)
Chloride: 103 mmol/L (ref 98–111)
Creatinine, Ser: 1.85 mg/dL — ABNORMAL HIGH (ref 0.61–1.24)
GFR, Estimated: 37 mL/min — ABNORMAL LOW (ref >60)
Glucose, Bld: 102 mg/dL — ABNORMAL HIGH (ref 70–99)
Potassium: 4.8 mmol/L (ref 3.5–5.1)
Sodium: 140 mmol/L (ref 135–145)

## 2024-06-04 LAB — GLUCOSE, CAPILLARY
Glucose-Capillary: 100 mg/dL — ABNORMAL HIGH (ref 70–99)
Glucose-Capillary: 110 mg/dL — ABNORMAL HIGH (ref 70–99)
Glucose-Capillary: 126 mg/dL — ABNORMAL HIGH (ref 70–99)
Glucose-Capillary: 200 mg/dL — ABNORMAL HIGH (ref 70–99)
Glucose-Capillary: 80 mg/dL (ref 70–99)
Glucose-Capillary: 96 mg/dL (ref 70–99)

## 2024-06-04 LAB — MAGNESIUM: Magnesium: 1.9 mg/dL (ref 1.7–2.4)

## 2024-06-04 MED ORDER — NICOTINE 21 MG/24HR TD PT24
21.0000 mg | MEDICATED_PATCH | Freq: Every day | TRANSDERMAL | Status: DC
  Administered 2024-06-04 – 2024-06-09 (×6): 21 mg via TRANSDERMAL
  Filled 2024-06-04 (×6): qty 1

## 2024-06-04 NOTE — Plan of Care (Signed)
  Problem: Activity: Goal: Ability to tolerate increased activity will improve Outcome: Progressing   Problem: Respiratory: Goal: Ability to maintain a clear airway and adequate ventilation will improve Outcome: Progressing   Problem: Role Relationship: Goal: Method of communication will improve Outcome: Progressing   Problem: Education: Goal: Knowledge of General Education information will improve Description: Including pain rating scale, medication(s)/side effects and non-pharmacologic comfort measures Outcome: Progressing   Problem: Health Behavior/Discharge Planning: Goal: Ability to manage health-related needs will improve Outcome: Progressing   Problem: Clinical Measurements: Goal: Ability to maintain clinical measurements within normal limits will improve Outcome: Progressing Goal: Diagnostic test results will improve Outcome: Progressing Goal: Respiratory complications will improve Outcome: Progressing

## 2024-06-04 NOTE — Progress Notes (Signed)
   06/04/24 1236  Vitals  Pulse Rate 91  ECG Heart Rate 93  Resp 15  Oxygen  Therapy  SpO2 (!) 80 %  MEWS Score  MEWS Temp 0  MEWS Systolic 0  MEWS Pulse 0  MEWS RR 0  MEWS LOC 0  MEWS Score 0  MEWS Score Color Green   Pt found with 02 off and 02 sats were down to 80%. 4l placed back on pt via nasal cannula.

## 2024-06-04 NOTE — Progress Notes (Signed)
 PROGRESS NOTE                                                                                                                                                                                                             Patient Demographics:    Clarence Maldonado, is a 78 y.o. male, DOB - 08/23/1946, FMW:[id number]  Outpatient Primary MD for the patient is Gil Greig BRAVO, NP    LOS - 23  Admit date - 05/12/2024    Chief Complaint  Patient presents with   Respiratory Distress       Brief Narrative (HPI from H&P)    78 year old man who presented to [facility] 8/8 for acute respiratory failure. PMHx significant for HTN, HLD, COPD, CKD 3b, DMT2, BPH, R AKA, anxiety/depression.   Patient resides at rehab facility. Having SOB on 8/8 and prescribed rocephin /azithro. Worsening SOB. EMS called found in severe resp distress unresponsive. LMA placed and being bagged en route to [facility] ED. Wheezing b/l. Patient intubated on arrival, he was kept in ICU, his workup was suggestive of metapneumovirus pneumonia, sepsis, he was further diagnosed with perineal abscess, drained by urology.  He was seen by PCCM, urology, ID.  He was stabilized and transferred to my care on 05/28/2024 on day 16 of his hospital stay.    Still has a NG tube, still appears extremely weak dehydrated and deconditioned, extremely poor cough reflex.    Significant Hospital Events:    8/8 - Admit w/ COPD, HMPV infection, intubated. 8/13 - Weaning on vent, PSV 10/5, mild tachypnea. Remains on Precedex . 8/15 Developed perineal abscess.  Underwent cystoscopy and I&D with purulent drainage, wound packed 8/16 Failing PSV, on low dose levoped 8/19: failed SBT, off vasopressors. Treated for hyperkalemia 8/20: tol PS wean  05/25/2024 extubating 05/28/2024.  Transferred to [facility]     Subjective:   Patient in bed, appears comfortable, denies any headache, no fever, no chest pain or  pressure, no shortness of breath , no abdominal pain. No new focal weakness.    Assessment  & Plan :    Sepsis upon admission due to combination of metapneumovirus and Pseudomonas pneumonia, perineal abscess with Candida glabrata - he is initially admitted to ICU and intubated, seen by urology, ID.  He underwent incision and drainage by urology on 05/19/2024.  Currently he has finished his  antibiotics & micafungin , urology assisting with wound care for his perineal abscess, still quite weak and deconditioned monitor closely.  Metapneumovirus pneumonia, weak cough reflex, at risk for aspiration, dysphagia.  Currently on tube feeds along with free water  flushes, cough reflex quite poor, have requested speech therapy to evaluate, okay remains at risk for aspiration pneumonia monitor closely.  Discussed with RT on 05/31/2024 he will require continued chest PT, NT suction and as needed Mucomyst  as he still has quite a bit of coarse breath sounds due to airway secretions.  I-S and flutter valve also on board.  Acute encephalopathy  Due to above and mild hypercapnia.  No headache or focal deficits, as needed Haldol , nighttime Seroquel , BiPAP for a few hours.  Discussed with pulmonary Dr. Claudene on 05/30/2024.  Continue to monitor.    Severe dehydration with AKI - CKD 3B and hypernatremia.  AKI gradually improving, continue IV fluids and free water  flushes baseline creatinine appears to be close to 2, poor oral intake adjusted IV fluids again on 05/31/2024   Hyperkalemia.  Treated.    Dysphagia due to above.  In by speech therapy NG tube discontinued on 05/28/2024 evening, now on oral diet dysphagia 1.  Monitor  Chronic diastolic CHF.  EF around 70% this admission on 05/14/2024.  Currently compensated.  Continue beta-blocker.    BPH - cont foley, will order for his home flomax  to start 8/24   HTN  -BID metop, PRN hydral   DM2  -oral intake is erratic, discontinue long-acting insulin  completely on 05/31/2024  as he is having episodes of severe hypoglycemia, sliding scale for now.    Lab Results  Component Value Date   HGBA1C See Scanned report in [place] Link 05/12/2024   CBG (last 3)  Recent Labs    06/04/24 0006 06/04/24 0511 06/04/24 0802  GLUCAP 200* 80 96           Condition - Extremely Guarded  Family Communication  :   Alyse Rush [phone] updated on 05/30/2024    Daughter [name] [phone]  on 05/28/2024 at 11 AM, message left again on 05/30/2024 at 11:20 AM.  Updated in patient's room on 05/31/2024   Code Status : Full code   Consults  : PCCM, ID, urology  PUD Prophylaxis : PPI   Procedures  :            Disposition Plan  :    Status is: Inpatient   DVT Prophylaxis  :    heparin  injection 5,000 Units Start: 05/20/24 0600 SCDs Start: 05/12/24 1851   Lab Results  Component Value Date   PLT 166 06/04/2024    Diet :  Diet Order             DIET DYS 3 Room service appropriate? Yes; Fluid consistency: Thin  Diet effective now                    Inpatient Medications  Scheduled Meds:  arformoterol   15 mcg Nebulization BID   atorvastatin   40 mg Oral Daily   budesonide  (PULMICORT ) nebulizer solution  0.5 mg Nebulization BID   Chlorhexidine  Gluconate Cloth  6 each Topical Daily   doxazosin   4 mg Oral Daily   feeding supplement  237 mL Oral TID BM   heparin  injection (subcutaneous)  5,000 Units Subcutaneous Q8H   insulin  aspart  0-9 Units Subcutaneous TID WC   multivitamin with minerals  1 tablet Oral Daily   mouth rinse  15 mL Mouth Rinse 4 times per day   pantoprazole   40 mg Oral Daily   QUEtiapine   25 mg Oral QHS   revefenacin   175 mcg Nebulization Daily   Continuous Infusions:   PRN Meds:.acetaminophen , albuterol , docusate, fentaNYL  (SUBLIMAZE ) injection, haloperidol  lactate, metoprolol  tartrate, mouth rinse, polyethylene glycol    Objective:   Vitals:   06/04/24 0741 06/04/24 0750 06/04/24 0752 06/04/24 0754  BP: (!)  143/71     Pulse: 73     Resp: 19     Temp: (!) 97.5 F (36.4 C)     TempSrc: Oral     SpO2: (!) 89% 97% 97% 98%  Weight:      Height:        Wt Readings from Last 3 Encounters:  06/03/24 62.4 kg  10/28/23 70.3 kg  10/14/23 71.2 kg     Intake/Output Summary (Last 24 hours) at 06/04/2024 0944 Last data filed at 06/03/2024 2000 Gross per 24 hour  Intake --  Output 900 ml  Net -900 ml     Physical Exam  Frail elderly male, awake much less confused , no new F.N deficits,   [place].AT,PERRAL Supple Neck, No JVD,   Symmetrical Chest wall movement, Good air movement bilaterally, coarse bilateral breath sounds RRR,No Gallops,Rubs or new Murmurs,  +ve B.Sounds, Abd Soft, No tenderness,   No Cyanosis, Foley catheter in place, right AKA       Data Review:    Recent Labs  Lab 05/31/24 0517 06/01/24 0639 06/02/24 0559 06/03/24 0641 06/04/24 0813  WBC 8.9 6.4 6.7 6.6 6.3  HGB 10.8* 10.2* 10.5* 9.9* 9.8*  HCT 35.5* 34.2* 35.4* 33.0* 32.8*  PLT 182 147* 166 170 166  MCV 88.3 89.5 90.8 89.9 89.9  MCH 26.9 26.7 26.9 27.0 26.8  MCHC 30.4 29.8* 29.7* 30.0 29.9*  RDW 16.4* 16.6* 16.5* 16.9* 17.2*  LYMPHSABS 1.0 0.8 0.6* 0.8 0.9  MONOABS 0.6 0.5 0.6 0.6 0.7  EOSABS 0.3 0.2 0.2 0.2 0.2  BASOSABS 0.0 0.0 0.1 0.1 0.1    Recent Labs  Lab 05/29/24 0615 05/30/24 0546 05/31/24 0517 06/01/24 0639 06/02/24 0559 06/03/24 0641  NA 144 144 146* 143 143 143  K 4.5 4.4 4.8 5.3* 5.1 4.2  CL 105 105 107 107 105 104  CO2 32 30 29 31 28 31   ANIONGAP 7 9 10 5 10 8   GLUCOSE 79 93 36* 78 118* 102*  BUN 92* 88* 77* 59* 55* 46*  CREATININE 1.75* 1.79* 1.68* 1.68* 1.88* 1.85*  AST 18 23 21 23 25   --   ALT 18 23 25 27  32  --   ALKPHOS 50 62 63 66 78  --   BILITOT 0.9 0.7 0.8 0.8 0.8  --   ALBUMIN 1.9* 2.2* 2.1* 2.0* 2.0*  --   CRP 2.4* 3.2* 5.7* 11.2*  --   --   PROCALCITON 0.43 0.46 0.50 0.39 0.30  --   MG 2.0 2.0 2.1 1.9  --  1.9  PHOS 5.4* 4.6 4.1 3.5  --   --   CALCIUM  8.4* 8.7*  8.6* 8.5* 8.6* 8.5*      Recent Labs  Lab 05/29/24 0615 05/30/24 0546 05/31/24 0517 06/01/24 0639 06/02/24 0559 06/03/24 0641  CRP 2.4* 3.2* 5.7* 11.2*  --   --   PROCALCITON 0.43 0.46 0.50 0.39 0.30  --   MG 2.0 2.0 2.1 1.9  --  1.9  CALCIUM  8.4* 8.7* 8.6* 8.5* 8.6* 8.5*    ---------------------------------------------------------------------------------------------------------------  Lab Results  Component Value Date   CHOL 82 05/12/2024   HDL 27 (L) 05/12/2024   LDLCALC 39 05/12/2024   TRIG 61 05/25/2024   CHOLHDL 3.0 05/12/2024    Lab Results  Component Value Date   HGBA1C See Scanned report in [place] Link 05/12/2024     Signature  -   Lavada Stank M.D on 06/04/2024 at 9:44 AM   -  To page go to [email or web address]

## 2024-06-04 NOTE — TOC Progression Note (Signed)
 Transition of Care [facility]) - Progression Note    Patient Details  Name: Clarence Maldonado MRN: [id number] Date of Birth: 1945/12/05  Transition of Care [facility]) CM/SW Contact  Isaiah Public, LCSWA Phone Number: 06/04/2024, 3:14 PM  Clinical Narrative:     Plan for patient to return back to [facility] when medically ready.  Expected Discharge Plan: Long Term Nursing Home (From [facility]) Barriers to Discharge: Continued Medical Work up               Expected Discharge Plan and Services In-house Referral: Clinical Social Work     Living arrangements for the past 2 months: Skilled Nursing Facility                                       Social Drivers of Health (SDOH) Interventions SDOH Screenings   Food Insecurity: No Food Insecurity (10/20/2023)  Housing: Unknown (10/20/2023)  Transportation Needs: No Transportation Needs (10/20/2023)  Utilities: Not At Risk (10/20/2023)  Depression (PHQ2-9): Low Risk  (10/14/2023)  Financial Resource Strain: Low Risk  (03/23/2019)  Physical Activity: Inactive (03/23/2019)  Social Connections: Socially Isolated (03/23/2019)  Stress: No Stress Concern Present (03/23/2019)  Tobacco Use: Medium Risk (05/19/2024)    Readmission Risk Interventions     No data to display

## 2024-06-05 ENCOUNTER — Inpatient Hospital Stay (HOSPITAL_COMMUNITY)

## 2024-06-05 DIAGNOSIS — J9602 Acute respiratory failure with hypercapnia: Secondary | ICD-10-CM | POA: Diagnosis not present

## 2024-06-05 LAB — BASIC METABOLIC PANEL WITH GFR
Anion gap: 12 (ref 5–15)
BUN: 41 mg/dL — ABNORMAL HIGH (ref 8–23)
CO2: 30 mmol/L (ref 22–32)
Calcium: 8.8 mg/dL — ABNORMAL LOW (ref 8.9–10.3)
Chloride: 101 mmol/L (ref 98–111)
Creatinine, Ser: 1.94 mg/dL — ABNORMAL HIGH (ref 0.61–1.24)
GFR, Estimated: 35 mL/min — ABNORMAL LOW (ref >60)
Glucose, Bld: 99 mg/dL (ref 70–99)
Potassium: 5.2 mmol/L — ABNORMAL HIGH (ref 3.5–5.1)
Sodium: 143 mmol/L (ref 135–145)

## 2024-06-05 LAB — CBC WITH DIFFERENTIAL/PLATELET
Abs Immature Granulocytes: 0.02 K/uL (ref 0.00–0.07)
Basophils Absolute: 0 K/uL (ref 0.0–0.1)
Basophils Relative: 1 %
Eosinophils Absolute: 0.3 K/uL (ref 0.0–0.5)
Eosinophils Relative: 5 %
HCT: 33.4 % — ABNORMAL LOW (ref 39.0–52.0)
Hemoglobin: 10.3 g/dL — ABNORMAL LOW (ref 13.0–17.0)
Immature Granulocytes: 0 %
Lymphocytes Relative: 15 %
Lymphs Abs: 0.8 K/uL (ref 0.7–4.0)
MCH: 27.3 pg (ref 26.0–34.0)
MCHC: 30.8 g/dL (ref 30.0–36.0)
MCV: 88.6 fL (ref 80.0–100.0)
Monocytes Absolute: 0.7 K/uL (ref 0.1–1.0)
Monocytes Relative: 13 %
Neutro Abs: 3.6 K/uL (ref 1.7–7.7)
Neutrophils Relative %: 66 %
Platelets: 174 K/uL (ref 150–400)
RBC: 3.77 MIL/uL — ABNORMAL LOW (ref 4.22–5.81)
RDW: 16.8 % — ABNORMAL HIGH (ref 11.5–15.5)
WBC: 5.4 K/uL (ref 4.0–10.5)
nRBC: 0 % (ref 0.0–0.2)

## 2024-06-05 LAB — GLUCOSE, CAPILLARY
Glucose-Capillary: 130 mg/dL — ABNORMAL HIGH (ref 70–99)
Glucose-Capillary: 135 mg/dL — ABNORMAL HIGH (ref 70–99)
Glucose-Capillary: 137 mg/dL — ABNORMAL HIGH (ref 70–99)
Glucose-Capillary: 140 mg/dL — ABNORMAL HIGH (ref 70–99)

## 2024-06-05 LAB — BRAIN NATRIURETIC PEPTIDE: B Natriuretic Peptide: 91.3 pg/mL (ref 0.0–100.0)

## 2024-06-05 LAB — MAGNESIUM: Magnesium: 1.9 mg/dL (ref 1.7–2.4)

## 2024-06-05 MED ORDER — FUROSEMIDE 10 MG/ML IJ SOLN
40.0000 mg | Freq: Once | INTRAMUSCULAR | Status: AC
  Administered 2024-06-05: 40 mg via INTRAVENOUS
  Filled 2024-06-05: qty 4

## 2024-06-05 MED ORDER — METOPROLOL TARTRATE 25 MG PO TABS
25.0000 mg | ORAL_TABLET | Freq: Two times a day (BID) | ORAL | Status: DC
  Administered 2024-06-05 – 2024-06-08 (×6): 25 mg via ORAL
  Filled 2024-06-05 (×8): qty 1

## 2024-06-05 MED ORDER — SODIUM ZIRCONIUM CYCLOSILICATE 10 G PO PACK
10.0000 g | PACK | Freq: Once | ORAL | Status: AC
  Administered 2024-06-05: 10 g via ORAL
  Filled 2024-06-05: qty 1

## 2024-06-05 MED ORDER — FUROSEMIDE 10 MG/ML IJ SOLN
20.0000 mg | Freq: Once | INTRAMUSCULAR | Status: AC
  Administered 2024-06-05: 20 mg via INTRAVENOUS
  Filled 2024-06-05: qty 2

## 2024-06-05 MED ORDER — METOPROLOL TARTRATE 5 MG/5ML IV SOLN
5.0000 mg | Freq: Once | INTRAVENOUS | Status: AC
  Administered 2024-06-05: 5 mg via INTRAVENOUS
  Filled 2024-06-05: qty 5

## 2024-06-05 NOTE — Plan of Care (Signed)
  Problem: Activity: Goal: Ability to tolerate increased activity will improve Outcome: Progressing   Problem: Respiratory: Goal: Ability to maintain a clear airway and adequate ventilation will improve Outcome: Progressing   Problem: Education: Goal: Knowledge of General Education information will improve Description: Including pain rating scale, medication(s)/side effects and non-pharmacologic comfort measures Outcome: Progressing   Problem: Health Behavior/Discharge Planning: Goal: Ability to manage health-related needs will improve Outcome: Progressing   Problem: Clinical Measurements: Goal: Ability to maintain clinical measurements within normal limits will improve Outcome: Progressing Goal: Respiratory complications will improve Outcome: Progressing   Problem: Activity: Goal: Risk for activity intolerance will decrease Outcome: Progressing   Problem: Nutrition: Goal: Adequate nutrition will be maintained Outcome: Progressing

## 2024-06-05 NOTE — Progress Notes (Signed)
 PROGRESS NOTE                                                                                                                                                                                                             Patient Demographics:    Clarence Maldonado, is a 78 y.o. male, DOB - 10/29/1945, FMW:[id number]  Outpatient Primary MD for the patient is Gil Greig BRAVO, NP    LOS - 24  Admit date - 05/12/2024    Chief Complaint  Patient presents with   Respiratory Distress       Brief Narrative (HPI from H&P)    78 year old man who presented to [facility] 8/8 for acute respiratory failure. PMHx significant for HTN, HLD, COPD, CKD 3b, DMT2, BPH, R AKA, anxiety/depression.   Patient resides at rehab facility. Having SOB on 8/8 and prescribed rocephin /azithro. Worsening SOB. EMS called found in severe resp distress unresponsive. LMA placed and being bagged en route to [facility] ED. Wheezing b/l. Patient intubated on arrival, he was kept in ICU, his workup was suggestive of metapneumovirus pneumonia, sepsis, he was further diagnosed with perineal abscess, drained by urology.  He was seen by PCCM, urology, ID.  He was stabilized and transferred to my care on 05/28/2024 on day 16 of his hospital stay.    Still has a NG tube, still appears extremely weak dehydrated and deconditioned, extremely poor cough reflex.    Significant Hospital Events:    8/8 - Admit w/ COPD, HMPV infection, intubated. 8/13 - Weaning on vent, PSV 10/5, mild tachypnea. Remains on Precedex . 8/15 Developed perineal abscess.  Underwent cystoscopy and I&D with purulent drainage, wound packed 8/16 Failing PSV, on low dose levoped 8/19: failed SBT, off vasopressors. Treated for hyperkalemia 8/20: tol PS wean  05/25/2024 extubating 05/28/2024.  Transferred to [facility]     Subjective:   Patient in bed, appears comfortable, denies any headache, no fever, no chest pain or  pressure, no shortness of breath , no abdominal pain. No new focal weakness.   Assessment  & Plan :    Sepsis upon admission due to combination of metapneumovirus and Pseudomonas pneumonia, perineal abscess with Candida glabrata - he is initially admitted to ICU and intubated, seen by urology, ID.  He underwent incision and drainage by urology on 05/19/2024.  Currently he has finished his antibiotics &  micafungin , urology assisting with wound care for his perineal abscess, still quite weak and deconditioned monitor closely.  Metapneumovirus pneumonia, weak cough reflex, at risk for aspiration, dysphagia.  Currently on tube feeds along with free water  flushes, cough reflex quite poor, have requested speech therapy to evaluate, okay remains at risk for aspiration pneumonia monitor closely.  Discussed with RT on 05/31/2024 he will require continued chest PT, NT suction and as needed Mucomyst  as he still has quite a bit of coarse breath sounds due to airway secretions.  I-S and flutter valve also on board.  Acute encephalopathy  Due to above and mild hypercapnia.  No headache or focal deficits, as needed Haldol , nighttime Seroquel , BiPAP for a few hours.  Discussed with pulmonary Dr. Claudene on 05/30/2024.  Continue to monitor.    Severe dehydration with AKI - CKD 3B and hypernatremia.  AKI gradually improving, continue IV fluids and free water  flushes baseline creatinine appears to be close to 2, poor oral intake adjusted IV fluids again on 05/31/2024   Hyperkalemia.  Treated.    Dysphagia due to above.  In by speech therapy NG tube discontinued on 05/28/2024 evening, now on oral diet dysphagia 1.  Monitor  Chronic diastolic CHF.  EF around 70% this admission on 05/14/2024.  Currently compensated.  Continue beta-blocker.  Trial of IV Lasix  06/05/2024 as he has some crackles.  BPH - cont foley, will order for his home flomax  to start 8/24   HTN  -BID metop, PRN hydral   DM2  -oral intake is erratic,  discontinue long-acting insulin  completely on 05/31/2024 as he is having episodes of severe hypoglycemia, sliding scale for now.    Lab Results  Component Value Date   HGBA1C See Scanned report in [place] Link 05/12/2024   CBG (last 3)  Recent Labs    06/04/24 1701 06/04/24 2136 06/05/24 0758  GLUCAP 100* 126* 130*         Condition - Extremely Guarded  Family Communication  :   Alyse Rush [phone] updated on 05/30/2024    Daughter [name] [phone]  on 05/28/2024 at 11 AM, message left again on 05/30/2024 at 11:20 AM.  Updated in patient's room on 05/31/2024   Code Status : Full code   Consults  : PCCM, ID, urology  PUD Prophylaxis : PPI   Procedures  :            Disposition Plan  :    Status is: Inpatient   DVT Prophylaxis  :    heparin  injection 5,000 Units Start: 05/20/24 0600 SCDs Start: 05/12/24 1851   Lab Results  Component Value Date   PLT 174 06/05/2024    Diet :  Diet Order             DIET DYS 3 Room service appropriate? Yes; Fluid consistency: Thin  Diet effective now                    Inpatient Medications  Scheduled Meds:  arformoterol   15 mcg Nebulization BID   atorvastatin   40 mg Oral Daily   budesonide  (PULMICORT ) nebulizer solution  0.5 mg Nebulization BID   Chlorhexidine  Gluconate Cloth  6 each Topical Daily   doxazosin   4 mg Oral Daily   feeding supplement  237 mL Oral TID BM   furosemide   20 mg Intravenous Once   heparin  injection (subcutaneous)  5,000 Units Subcutaneous Q8H   insulin  aspart  0-9 Units Subcutaneous TID WC  metoprolol  tartrate  25 mg Oral BID   multivitamin with minerals  1 tablet Oral Daily   nicotine   21 mg Transdermal Daily   mouth rinse  15 mL Mouth Rinse 4 times per day   pantoprazole   40 mg Oral Daily   QUEtiapine   25 mg Oral QHS   revefenacin   175 mcg Nebulization Daily   sodium zirconium cyclosilicate   10 g Oral Once   Continuous Infusions:   PRN Meds:.acetaminophen ,  albuterol , docusate, fentaNYL  (SUBLIMAZE ) injection, haloperidol  lactate, metoprolol  tartrate, mouth rinse, polyethylene glycol    Objective:   Vitals:   06/05/24 0500 06/05/24 0734 06/05/24 0737 06/05/24 0753  BP:    103/71  Pulse: 90   92  Resp: 17   18  Temp:    97.7 F (36.5 C)  TempSrc:    Axillary  SpO2: 100% 100% 100% 97%  Weight:      Height:        Wt Readings from Last 3 Encounters:  06/03/24 62.4 kg  10/28/23 70.3 kg  10/14/23 71.2 kg     Intake/Output Summary (Last 24 hours) at 06/05/2024 0823 Last data filed at 06/05/2024 0551 Gross per 24 hour  Intake 360 ml  Output 1950 ml  Net -1590 ml     Physical Exam  Frail elderly male, awake much less confused , no new F.N deficits,   [place].AT,PERRAL Supple Neck, No JVD,   Symmetrical Chest wall movement, Good air movement bilaterally, coarse bilateral breath sounds, some crackles today as well. RRR,No Gallops,Rubs or new Murmurs,  +ve B.Sounds, Abd Soft, No tenderness,   No Cyanosis, Foley catheter in place, right AKA       Data Review:    Recent Labs  Lab 06/01/24 0639 06/02/24 0559 06/03/24 0641 06/04/24 0813 06/05/24 0627  WBC 6.4 6.7 6.6 6.3 5.4  HGB 10.2* 10.5* 9.9* 9.8* 10.3*  HCT 34.2* 35.4* 33.0* 32.8* 33.4*  PLT 147* 166 170 166 174  MCV 89.5 90.8 89.9 89.9 88.6  MCH 26.7 26.9 27.0 26.8 27.3  MCHC 29.8* 29.7* 30.0 29.9* 30.8  RDW 16.6* 16.5* 16.9* 17.2* 16.8*  LYMPHSABS 0.8 0.6* 0.8 0.9 0.8  MONOABS 0.5 0.6 0.6 0.7 0.7  EOSABS 0.2 0.2 0.2 0.2 0.3  BASOSABS 0.0 0.1 0.1 0.1 0.0    Recent Labs  Lab 05/30/24 0546 05/31/24 0517 06/01/24 0639 06/02/24 0559 06/03/24 0641 06/04/24 0813 06/05/24 0627  NA 144 146* 143 143 143 140 143  K 4.4 4.8 5.3* 5.1 4.2 4.8 5.2*  CL 105 107 107 105 104 103 101  CO2 30 29 31 28 31 28 30   ANIONGAP 9 10 5 10 8 9 12   GLUCOSE 93 36* 78 118* 102* 102* 99  BUN 88* 77* 59* 55* 46* 39* 41*  CREATININE 1.79* 1.68* 1.68* 1.88* 1.85* 1.85* 1.94*  AST 23 21 23  25   --   --   --   ALT 23 25 27  32  --   --   --   ALKPHOS 62 63 66 78  --   --   --   BILITOT 0.7 0.8 0.8 0.8  --   --   --   ALBUMIN 2.2* 2.1* 2.0* 2.0*  --   --   --   CRP 3.2* 5.7* 11.2*  --   --   --   --   PROCALCITON 0.46 0.50 0.39 0.30  --   --   --   BNP  --   --   --   --   --   --  91.3  MG 2.0 2.1 1.9  --  1.9 1.9 1.9  PHOS 4.6 4.1 3.5  --   --   --   --   CALCIUM  8.7* 8.6* 8.5* 8.6* 8.5* 8.5* 8.8*      Recent Labs  Lab 05/30/24 0546 05/31/24 0517 06/01/24 0639 06/02/24 0559 06/03/24 0641 06/04/24 0813 06/05/24 0627  CRP 3.2* 5.7* 11.2*  --   --   --   --   PROCALCITON 0.46 0.50 0.39 0.30  --   --   --   BNP  --   --   --   --   --   --  91.3  MG 2.0 2.1 1.9  --  1.9 1.9 1.9  CALCIUM  8.7* 8.6* 8.5* 8.6* 8.5* 8.5* 8.8*    --------------------------------------------------------------------------------------------------------------- Lab Results  Component Value Date   CHOL 82 05/12/2024   HDL 27 (L) 05/12/2024   LDLCALC 39 05/12/2024   TRIG 61 05/25/2024   CHOLHDL 3.0 05/12/2024    Lab Results  Component Value Date   HGBA1C See Scanned report in [place] Link 05/12/2024     Signature  -   Lavada Stank M.D on 06/05/2024 at 8:23 AM   -  To page go to [email or web address]

## 2024-06-06 ENCOUNTER — Inpatient Hospital Stay (HOSPITAL_COMMUNITY)

## 2024-06-06 DIAGNOSIS — J9602 Acute respiratory failure with hypercapnia: Secondary | ICD-10-CM | POA: Diagnosis not present

## 2024-06-06 LAB — GLUCOSE, CAPILLARY
Glucose-Capillary: 124 mg/dL — ABNORMAL HIGH (ref 70–99)
Glucose-Capillary: 186 mg/dL — ABNORMAL HIGH (ref 70–99)
Glucose-Capillary: 167 mg/dL — ABNORMAL HIGH (ref 70–99)
Glucose-Capillary: 123 mg/dL — ABNORMAL HIGH (ref 70–99)

## 2024-06-06 LAB — BASIC METABOLIC PANEL WITH GFR
Anion gap: 13 (ref 5–15)
BUN: 51 mg/dL — ABNORMAL HIGH (ref 8–23)
CO2: 30 mmol/L (ref 22–32)
Calcium: 8.7 mg/dL — ABNORMAL LOW (ref 8.9–10.3)
Chloride: 96 mmol/L — ABNORMAL LOW (ref 98–111)
Creatinine, Ser: 2.06 mg/dL — ABNORMAL HIGH (ref 0.61–1.24)
GFR, Estimated: 33 mL/min — ABNORMAL LOW (ref >60)
Glucose, Bld: 121 mg/dL — ABNORMAL HIGH (ref 70–99)
Potassium: 4.2 mmol/L (ref 3.5–5.1)
Sodium: 139 mmol/L (ref 135–145)

## 2024-06-06 LAB — CBC WITH DIFFERENTIAL/PLATELET
Abs Immature Granulocytes: 0.03 K/uL (ref 0.00–0.07)
Basophils Absolute: 0 K/uL (ref 0.0–0.1)
Basophils Relative: 1 %
Eosinophils Absolute: 0.3 K/uL (ref 0.0–0.5)
Eosinophils Relative: 5 %
HCT: 34.5 % — ABNORMAL LOW (ref 39.0–52.0)
Hemoglobin: 10.8 g/dL — ABNORMAL LOW (ref 13.0–17.0)
Immature Granulocytes: 1 %
Lymphocytes Relative: 15 %
Lymphs Abs: 0.8 K/uL (ref 0.7–4.0)
MCH: 27.4 pg (ref 26.0–34.0)
MCHC: 31.3 g/dL (ref 30.0–36.0)
MCV: 87.6 fL (ref 80.0–100.0)
Monocytes Absolute: 0.6 K/uL (ref 0.1–1.0)
Monocytes Relative: 10 %
Neutro Abs: 3.9 K/uL (ref 1.7–7.7)
Neutrophils Relative %: 68 %
Platelets: 180 K/uL (ref 150–400)
RBC: 3.94 MIL/uL — ABNORMAL LOW (ref 4.22–5.81)
RDW: 17 % — ABNORMAL HIGH (ref 11.5–15.5)
WBC: 5.6 K/uL (ref 4.0–10.5)
nRBC: 0 % (ref 0.0–0.2)

## 2024-06-06 LAB — MAGNESIUM: Magnesium: 1.9 mg/dL (ref 1.7–2.4)

## 2024-06-06 NOTE — Plan of Care (Signed)
  Problem: Activity: Goal: Ability to tolerate increased activity will improve Outcome: Progressing   Problem: Respiratory: Goal: Ability to maintain a clear airway and adequate ventilation will improve Outcome: Progressing   Problem: Education: Goal: Knowledge of General Education information will improve Description: Including pain rating scale, medication(s)/side effects and non-pharmacologic comfort measures Outcome: Progressing   Problem: Clinical Measurements: Goal: Ability to maintain clinical measurements within normal limits will improve Outcome: Progressing   Problem: Activity: Goal: Risk for activity intolerance will decrease Outcome: Progressing   Problem: Nutrition: Goal: Adequate nutrition will be maintained Outcome: Progressing

## 2024-06-06 NOTE — Progress Notes (Signed)
 PROGRESS NOTE                                                                                                                                                                                                             Patient Demographics:    Clarence Maldonado, is a 78 y.o. male, DOB - 1946-08-11, FMW:[id number]  Outpatient Primary MD for the patient is Gil Greig BRAVO, NP    LOS - 25  Admit date - 05/12/2024    Chief Complaint  Patient presents with   Respiratory Distress       Brief Narrative (HPI from H&P)    78 year old man who presented to [facility] 8/8 for acute respiratory failure. PMHx significant for HTN, HLD, COPD, CKD 3b, DMT2, BPH, R AKA, anxiety/depression.   Patient resides at rehab facility. Having SOB on 8/8 and prescribed rocephin /azithro. Worsening SOB. EMS called found in severe resp distress unresponsive. LMA placed and being bagged en route to [facility] ED. Wheezing b/l. Patient intubated on arrival, he was kept in ICU, his workup was suggestive of metapneumovirus pneumonia, sepsis, he was further diagnosed with perineal abscess, drained by urology.  He was seen by PCCM, urology, ID.  He was stabilized and transferred to my care on 05/28/2024 on day 16 of his hospital stay.    Still has a NG tube, still appears extremely weak dehydrated and deconditioned, extremely poor cough reflex.    Significant Hospital Events:    8/8 - Admit w/ COPD, HMPV infection, intubated. 8/13 - Weaning on vent, PSV 10/5, mild tachypnea. Remains on Precedex . 8/15 Developed perineal abscess.  Underwent cystoscopy and I&D with purulent drainage, wound packed 8/16 Failing PSV, on low dose levoped 8/19: failed SBT, off vasopressors. Treated for hyperkalemia 8/20: tol PS wean  05/25/2024 extubating 05/28/2024.  Transferred to [facility]     Subjective:   Patient in bed, appears comfortable, denies any headache, no fever, no chest pain or  pressure, no shortness of breath , no abdominal pain. No new focal weakness.    Assessment  & Plan :    Sepsis upon admission due to combination of metapneumovirus and Pseudomonas pneumonia, perineal abscess and with Candida glabrata fungemia- he is initially admitted to ICU and intubated, seen by urology, ID.  He underwent incision and drainage by urology on 05/19/2024.  Currently he has finished  his antibiotics & micafungin , urology assisting with wound care for his perineal abscess, still quite weak and deconditioned monitor closely.  Metapneumovirus pneumonia, weak cough reflex, at risk for aspiration, dysphagia.  Currently on tube feeds along with free water  flushes, cough reflex quite poor, have requested speech therapy to evaluate, okay remains at risk for aspiration pneumonia monitor closely.  Discussed with RT on 05/31/2024 he will require continued chest PT, NT suction and as needed Mucomyst  as he still has quite a bit of coarse breath sounds due to airway secretions.  I-S and flutter valve also on board.  Clinically improving.  Acute encephalopathy  Due to above and mild hypercapnia.  No headache or focal deficits, as needed Haldol , nighttime Seroquel , BiPAP for a few hours.  Discussed with pulmonary Dr. Claudene on 05/30/2024.  Continue to monitor.    Severe dehydration with AKI - CKD 3B and hypernatremia.  AKI gradually improving, continue IV fluids and free water  flushes baseline creatinine appears to be close to 2, poor oral intake adjusted IV fluids again on 05/31/2024   Lung nodule noted on chest x-ray.  CT chest ordered on 06/06/2024.  Will definitely require pulmonary follow-up postdischarge within a month.    Hyperkalemia.  Treated.    Dysphagia due to above.  In by speech therapy NG tube discontinued on 05/28/2024 evening, now on oral diet dysphagia 1.  Monitor  Chronic diastolic CHF.  EF around 70% this admission on 05/14/2024.  Currently compensated.  Continue beta-blocker, got a  trial of Lasix  on 06/05/2024 with excellent urine output, use Lasix  as needed, address daily  BPH - cont foley, on home Flomax .  Foley will be discontinued if urology wants to remove it, due to complex perineal wound.   HTN  -BID metop, PRN hydral   DM2  -oral intake is erratic, discontinue long-acting insulin  completely on 05/31/2024 as he is having episodes of severe hypoglycemia, sliding scale for now.    Lab Results  Component Value Date   HGBA1C See Scanned report in [place] Link 05/12/2024   CBG (last 3)  Recent Labs    06/05/24 1538 06/05/24 2043 06/06/24 0827  GLUCAP 137* 140* 124*         Condition - Extremely Guarded  Family Communication  :   Alyse Rush [phone] updated on 05/30/2024    Daughter [name] [phone]  on 05/28/2024 at 11 AM, message left again on 05/30/2024 at 11:20 AM.  Updated in patient's room on 05/31/2024   Code Status : Full code   Consults  : PCCM, ID, urology  PUD Prophylaxis : PPI   Procedures  :            Disposition Plan  :    Status is: Inpatient   DVT Prophylaxis  :    heparin  injection 5,000 Units Start: 05/20/24 0600 SCDs Start: 05/12/24 1851   Lab Results  Component Value Date   PLT 180 06/06/2024    Diet :  Diet Order             DIET DYS 3 Room service appropriate? Yes; Fluid consistency: Thin  Diet effective now                    Inpatient Medications  Scheduled Meds:  arformoterol   15 mcg Nebulization BID   atorvastatin   40 mg Oral Daily   budesonide  (PULMICORT ) nebulizer solution  0.5 mg Nebulization BID   Chlorhexidine  Gluconate Cloth  6 each Topical Daily  doxazosin   4 mg Oral Daily   feeding supplement  237 mL Oral TID BM   heparin  injection (subcutaneous)  5,000 Units Subcutaneous Q8H   insulin  aspart  0-9 Units Subcutaneous TID WC   metoprolol  tartrate  25 mg Oral BID   multivitamin with minerals  1 tablet Oral Daily   nicotine   21 mg Transdermal Daily   mouth rinse  15 mL  Mouth Rinse 4 times per day   pantoprazole   40 mg Oral Daily   QUEtiapine   25 mg Oral QHS   revefenacin   175 mcg Nebulization Daily   Continuous Infusions:   PRN Meds:.acetaminophen , albuterol , docusate, fentaNYL  (SUBLIMAZE ) injection, haloperidol  lactate, metoprolol  tartrate, mouth rinse, polyethylene glycol    Objective:   Vitals:   06/06/24 0716 06/06/24 0720 06/06/24 0724 06/06/24 0824  BP:    105/66  Pulse: (!) 107     Resp: 17     Temp:    98.7 F (37.1 C)  TempSrc:    Oral  SpO2: 94% 94% 94%   Weight:      Height:        Wt Readings from Last 3 Encounters:  06/06/24 58.2 kg  10/28/23 70.3 kg  10/14/23 71.2 kg     Intake/Output Summary (Last 24 hours) at 06/06/2024 0844 Last data filed at 06/06/2024 0600 Gross per 24 hour  Intake --  Output 3650 ml  Net -3650 ml     Physical Exam  Frail elderly male, awake much less confused , no new F.N deficits,   [place].AT,PERRAL Supple Neck, No JVD,   Symmetrical Chest wall movement, Good air movement bilaterally, coarse bilateral breath sounds, crackles have resolved RRR,No Gallops,Rubs or new Murmurs,  +ve B.Sounds, Abd Soft, No tenderness,   No Cyanosis, Foley catheter in place, right AKA       Data Review:    Recent Labs  Lab 06/02/24 0559 06/03/24 0641 06/04/24 0813 06/05/24 0627 06/06/24 0533  WBC 6.7 6.6 6.3 5.4 5.6  HGB 10.5* 9.9* 9.8* 10.3* 10.8*  HCT 35.4* 33.0* 32.8* 33.4* 34.5*  PLT 166 170 166 174 180  MCV 90.8 89.9 89.9 88.6 87.6  MCH 26.9 27.0 26.8 27.3 27.4  MCHC 29.7* 30.0 29.9* 30.8 31.3  RDW 16.5* 16.9* 17.2* 16.8* 17.0*  LYMPHSABS 0.6* 0.8 0.9 0.8 0.8  MONOABS 0.6 0.6 0.7 0.7 0.6  EOSABS 0.2 0.2 0.2 0.3 0.3  BASOSABS 0.1 0.1 0.1 0.0 0.0    Recent Labs  Lab 05/31/24 0517 06/01/24 0639 06/02/24 0559 06/03/24 0641 06/04/24 0813 06/05/24 0627 06/06/24 0533  NA 146* 143 143 143 140 143 139  K 4.8 5.3* 5.1 4.2 4.8 5.2* 4.2  CL 107 107 105 104 103 101 96*  CO2 29 31 28 31 28 30 30    ANIONGAP 10 5 10 8 9 12 13   GLUCOSE 36* 78 118* 102* 102* 99 121*  BUN 77* 59* 55* 46* 39* 41* 51*  CREATININE 1.68* 1.68* 1.88* 1.85* 1.85* 1.94* 2.06*  AST 21 23 25   --   --   --   --   ALT 25 27 32  --   --   --   --   ALKPHOS 63 66 78  --   --   --   --   BILITOT 0.8 0.8 0.8  --   --   --   --   ALBUMIN 2.1* 2.0* 2.0*  --   --   --   --   CRP  5.7* 11.2*  --   --   --   --   --   PROCALCITON 0.50 0.39 0.30  --   --   --   --   BNP  --   --   --   --   --  91.3  --   MG 2.1 1.9  --  1.9 1.9 1.9 1.9  PHOS 4.1 3.5  --   --   --   --   --   CALCIUM  8.6* 8.5* 8.6* 8.5* 8.5* 8.8* 8.7*      Recent Labs  Lab 05/31/24 0517 06/01/24 0639 06/02/24 0559 06/03/24 0641 06/04/24 0813 06/05/24 0627 06/06/24 0533  CRP 5.7* 11.2*  --   --   --   --   --   PROCALCITON 0.50 0.39 0.30  --   --   --   --   BNP  --   --   --   --   --  91.3  --   MG 2.1 1.9  --  1.9 1.9 1.9 1.9  CALCIUM  8.6* 8.5* 8.6* 8.5* 8.5* 8.8* 8.7*    --------------------------------------------------------------------------------------------------------------- Lab Results  Component Value Date   CHOL 82 05/12/2024   HDL 27 (L) 05/12/2024   LDLCALC 39 05/12/2024   TRIG 61 05/25/2024   CHOLHDL 3.0 05/12/2024    Lab Results  Component Value Date   HGBA1C See Scanned report in [place] Link 05/12/2024     Signature  -   Lavada Stank M.D on 06/06/2024 at 8:44 AM   -  To page go to [email or web address]

## 2024-06-06 NOTE — Plan of Care (Signed)
  Problem: Activity: Goal: Ability to tolerate increased activity will improve Outcome: Progressing   Problem: Respiratory: Goal: Ability to maintain a clear airway and adequate ventilation will improve Outcome: Progressing   Problem: Education: Goal: Knowledge of General Education information will improve Description: Including pain rating scale, medication(s)/side effects and non-pharmacologic comfort measures Outcome: Progressing   Problem: Clinical Measurements: Goal: Ability to maintain clinical measurements within normal limits will improve Outcome: Progressing Goal: Respiratory complications will improve Outcome: Progressing   Problem: Activity: Goal: Risk for activity intolerance will decrease Outcome: Progressing   Problem: Nutrition: Goal: Adequate nutrition will be maintained Outcome: Progressing   Problem: Coping: Goal: Level of anxiety will decrease Outcome: Progressing

## 2024-06-06 NOTE — Plan of Care (Signed)
  Problem: Activity: Goal: Ability to tolerate increased activity will improve 06/06/2024 1755 by Sherre Brunet, RN Outcome: Not Progressing 06/06/2024 1755 by Sherre Brunet, RN Outcome: Not Progressing 06/06/2024 1755 by Sherre Brunet, RN Outcome: Progressing   Problem: Respiratory: Goal: Ability to maintain a clear airway and adequate ventilation will improve Outcome: Not Progressing   Problem: Role Relationship: Goal: Method of communication will improve Outcome: Not Progressing   Problem: Education: Goal: Knowledge of General Education information will improve Description: Including pain rating scale, medication(s)/side effects and non-pharmacologic comfort measures Outcome: Not Progressing

## 2024-06-06 NOTE — Progress Notes (Signed)
 Mobility Specialist Progress Note:   06/06/24 1505  Mobility  Activity Pivoted/transferred from chair to bed  Level of Assistance Maximum assist, patient does 25-49% (+2)  Assistive Device  (HHA)  Distance Ambulated (ft) 2 ft  Activity Response Tolerated well  Mobility Referral Yes  Mobility visit 1 Mobility  Mobility Specialist Start Time (ACUTE ONLY) 1505  Mobility Specialist Stop Time (ACUTE ONLY) 1515  Mobility Specialist Time Calculation (min) (ACUTE ONLY) 10 min   Pt agreeable to mobility session. Required maxA+2 to squat pivot to bed. Pt c/o soreness on bottom. Left in bed with all needs met, alarm on.  Therisa Rana Mobility Specialist Please contact via SecureChat or  Rehab office at [phone]

## 2024-06-06 NOTE — Progress Notes (Signed)
 Physical Therapy Treatment Patient Details Name: Clarence Maldonado MRN: [id number] DOB: 1946/03/22 Today's Date: 06/06/2024   History of Present Illness 78 year old man who presented via EMS 8/8 for acute respiratory failure. EMS found in severe resp distress unresponsive. Pt intubated on arrival to [facility] and taken to ICU. Admitted for treatment of COPD, and HMPV infection. Developed perineal abscess, 8/15 underwent cystoscopy with I&D. Extubated 8/21  PMHx significant for HTN, HLD, COPD, CKD 3b, DMT2, BPH, R AKA, anxiety/depression.    PT Comments  Patient received in bed, he is pleasant and agrees to PT session. Patient requires min/mod A for bed mobility. He requires max +2 A for squat pivot transfer to recliner. Patient is weak and will continue to benefit from skilled PT to improve strength and independence.       If plan is discharge home, recommend the following: A lot of help with walking and/or transfers;A lot of help with bathing/dressing/bathroom;Assistance with cooking/housework;Direct supervision/assist for medications management;Direct supervision/assist for financial management;Assist for transportation;Help with stairs or ramp for entrance;Supervision due to cognitive status   Can travel by private vehicle     No  Equipment Recommendations  Other (comment) (TBD)    Recommendations for Other Services       Precautions / Restrictions Precautions Precautions: Fall Recall of Precautions/Restrictions: Impaired Restrictions Weight Bearing Restrictions Per Provider Order: No     Mobility  Bed Mobility Overal bed mobility: Needs Assistance Bed Mobility: Supine to Sit     Supine to sit: Min assist, +2 for safety/equipment, HOB elevated, Used rails     General bed mobility comments: Able to sit edge of bed unsupported this session.    Transfers Overall transfer level: Needs assistance Equipment used: 2 person hand held assist Transfers: Bed to chair/wheelchair/BSC        Squat pivot transfers: Max assist, +2 physical assistance     General transfer comment: Patient is quite weak and requires +2 assist for transfers at this time    Ambulation/Gait               General Gait Details: deferred   Stairs             Wheelchair Mobility     Tilt Bed    Modified Rankin (Stroke Patients Only)       Balance Overall balance assessment: Needs assistance Sitting-balance support: Feet supported Sitting balance-Leahy Scale: Good Sitting balance - Comments: Able to sit unsupported in bed this session   Standing balance support: Bilateral upper extremity supported, During functional activity Standing balance-Leahy Scale: Poor Standing balance comment: Reliant on therapist                            Communication Communication Communication: No apparent difficulties  Cognition Arousal: Alert Behavior During Therapy: WFL for tasks assessed/performed   PT - Cognitive impairments: No family/caregiver present to determine baseline, Awareness, Problem solving, Safety/Judgement   Orientation impairments: Time, Situation                     Following commands: Impaired Following commands impaired: Follows one step commands inconsistently, Follows one step commands with increased time    Cueing Cueing Techniques: Verbal cues, Gestural cues, Tactile cues  Exercises      General Comments        Pertinent Vitals/Pain Pain Assessment Pain Assessment: No/denies pain    Home Living  Prior Function            PT Goals (current goals can now be found in the care plan section) Acute Rehab PT Goals Time For Goal Achievement: 06/09/24 Potential to Achieve Goals: Fair Progress towards PT goals: Progressing toward goals    Frequency    Min 2X/week      PT Plan      Co-evaluation              AM-PAC PT 6 Clicks Mobility   Outcome Measure  Help needed turning  from your back to your side while in a flat bed without using bedrails?: A Lot Help needed moving from lying on your back to sitting on the side of a flat bed without using bedrails?: A Lot Help needed moving to and from a bed to a chair (including a wheelchair)?: A Lot Help needed standing up from a chair using your arms (e.g., wheelchair or bedside chair)?: A Lot Help needed to walk in hospital room?: Total Help needed climbing 3-5 steps with a railing? : Total 6 Click Score: 10    End of Session Equipment Utilized During Treatment: Oxygen  Activity Tolerance: Patient tolerated treatment well Patient left: in chair;with call bell/phone within reach;with chair alarm set Nurse Communication: Mobility status;Need for lift equipment PT Visit Diagnosis: Difficulty in walking, not elsewhere classified (R26.2);Muscle weakness (generalized) (M62.81);Unsteadiness on feet (R26.81);Other symptoms and signs involving the nervous system (R29.898);Adult, failure to thrive (R62.7);Other abnormalities of gait and mobility (R26.89)     Time: 8892-8878 PT Time Calculation (min) (ACUTE ONLY): 14 min  Charges:    $Therapeutic Activity: 8-22 mins PT General Charges $$ ACUTE PT VISIT: 1 Visit                     Carmencita Cusic, PT, GCS 06/06/24,12:53 PM

## 2024-06-07 DIAGNOSIS — J9602 Acute respiratory failure with hypercapnia: Secondary | ICD-10-CM | POA: Diagnosis not present

## 2024-06-07 LAB — BASIC METABOLIC PANEL WITH GFR
Anion gap: 8 (ref 5–15)
BUN: 64 mg/dL — ABNORMAL HIGH (ref 8–23)
CO2: 30 mmol/L (ref 22–32)
Calcium: 8.9 mg/dL (ref 8.9–10.3)
Chloride: 99 mmol/L (ref 98–111)
Creatinine, Ser: 2.12 mg/dL — ABNORMAL HIGH (ref 0.61–1.24)
GFR, Estimated: 31 mL/min — ABNORMAL LOW (ref >60)
Glucose, Bld: 111 mg/dL — ABNORMAL HIGH (ref 70–99)
Potassium: 4.5 mmol/L (ref 3.5–5.1)
Sodium: 137 mmol/L (ref 135–145)

## 2024-06-07 LAB — CBC WITH DIFFERENTIAL/PLATELET
Abs Immature Granulocytes: 0.04 K/uL (ref 0.00–0.07)
Basophils Absolute: 0 K/uL (ref 0.0–0.1)
Basophils Relative: 1 %
Eosinophils Absolute: 0.3 K/uL (ref 0.0–0.5)
Eosinophils Relative: 7 %
HCT: 35.8 % — ABNORMAL LOW (ref 39.0–52.0)
Hemoglobin: 10.8 g/dL — ABNORMAL LOW (ref 13.0–17.0)
Immature Granulocytes: 1 %
Lymphocytes Relative: 16 %
Lymphs Abs: 0.8 K/uL (ref 0.7–4.0)
MCH: 26.9 pg (ref 26.0–34.0)
MCHC: 30.2 g/dL (ref 30.0–36.0)
MCV: 89.1 fL (ref 80.0–100.0)
Monocytes Absolute: 0.5 K/uL (ref 0.1–1.0)
Monocytes Relative: 10 %
Neutro Abs: 3.3 K/uL (ref 1.7–7.7)
Neutrophils Relative %: 65 %
Platelets: 168 K/uL (ref 150–400)
RBC: 4.02 MIL/uL — ABNORMAL LOW (ref 4.22–5.81)
RDW: 17.2 % — ABNORMAL HIGH (ref 11.5–15.5)
WBC: 4.9 K/uL (ref 4.0–10.5)
nRBC: 0 % (ref 0.0–0.2)

## 2024-06-07 LAB — GLUCOSE, CAPILLARY
Glucose-Capillary: 111 mg/dL — ABNORMAL HIGH (ref 70–99)
Glucose-Capillary: 186 mg/dL — ABNORMAL HIGH (ref 70–99)
Glucose-Capillary: 202 mg/dL — ABNORMAL HIGH (ref 70–99)

## 2024-06-07 LAB — MAGNESIUM: Magnesium: 1.9 mg/dL (ref 1.7–2.4)

## 2024-06-07 NOTE — Progress Notes (Signed)
 Speech Language Pathology Treatment: Dysphagia  Patient Details Name: Clarence Maldonado MRN: [id number] DOB: Feb 14, 1946 Today's Date: 06/07/2024 Time: 8572-8547 SLP Time Calculation (min) (ACUTE ONLY): 25 min  Assessment / Plan / Recommendation Clinical Impression  Patient seen by SLP for skilled treatment focused on dysphagia goals. Patient was awake, alert, leaning to right side of bed when SLP arrived. RR and SpO2 remained WFL. Patient continues with confusion, telling SLP he was trying to take this off and gesturing to nasal cannula as well as telemetry leads. When discussing his PO toleration, he became fixated on telling SLP about how twice they throw it away referring to his lunch meal tray.  He was agreeable to repositioning in bed and to PO's of thin liquids (soda) and graham crackers. He was able to feed self without difficulty and mastication was efficient considering he is edentulous. He has an intermittent, congested cough which occurred prior to and during PO intake but did not appear to directly correlate with PO intake. SLP secure message chatted RN who reported that patient was tolerating his meals today without observed difficulty. SLP recommending to continue on dysphagia 3 (mechanical soft) solids and thin liquids. No further skilled intervention warranted at this venue of care but recommend SLP f/u at SNF.    HPI HPI: Patient is a 78 y.o. male with PMH: COPD, CDK 3b, DMT2, HTN, HLD, BPH, R AKA, anxiety/depression. During 2020 hospitalization he required SLP assessment and intervention due to dysphagia but did advance to regular solids, thin liquids with no f/u warranted. No documented dysphagia intervention since 2020 seen per chart review. Patient presented to the hospital on 05/12/2024 from SNF with worsening SOB. When EMS arrived he was in severe respiratory distress and unresponsive. Patient intubated on arrival, he was kept in ICU, his workup was suggestive of metapneumovirus  pneumonia, sepsis, he was further diagnosed with perineal abscess, drained by urology.  He was extubated on 05/25/24. He passed [facility] swallow screen with RN on 05/27/24 and has been on full liquids diet. Cortrak feeding tube is in place. SLP swallow evaluation ordered on 05/28/2024 to assess his swallow function as he has a weak cough, is dehydrated, has nasal trumpet in place for suctioning.      SLP Plan  Discharge SLP treatment due to (comment);All goals met          Recommendations  Diet recommendations: Dysphagia 3 (mechanical soft);Thin liquid Liquids provided via: Straw Medication Administration: Other (Comment) (as tolerated) Supervision: Intermittent supervision to cue for compensatory strategies Compensations: Slow rate;Small sips/bites Postural Changes and/or Swallow Maneuvers: Seated upright 90 degrees                  Oral care BID   Frequent or constant Supervision/Assistance Dysphagia, unspecified (R13.10)     Discharge SLP treatment due to (comment);All goals met    Norleen IVAR Blase, MA, CCC-SLP Speech Therapy

## 2024-06-07 NOTE — Progress Notes (Signed)
 PROGRESS NOTE                                                                                                                                                                                                             Patient Demographics:    Clarence Maldonado, is a 78 y.o. male, DOB - 07/21/1946, FMW:[id number]  Outpatient Primary MD for the patient is Gil Greig BRAVO, NP    LOS - 26  Admit date - 05/12/2024    Chief Complaint  Patient presents with   Respiratory Distress       Brief Narrative (HPI from H&P)    78 year old man who presented to [facility] 8/8 for acute respiratory failure. PMHx significant for HTN, HLD, COPD, CKD 3b, DMT2, BPH, R AKA, anxiety/depression.   Patient resides at rehab facility. Having SOB on 8/8 and prescribed rocephin /azithro. Worsening SOB. EMS called found in severe resp distress unresponsive. LMA placed and being bagged en route to [facility] ED. Wheezing b/l. Patient intubated on arrival, he was kept in ICU, his workup was suggestive of metapneumovirus pneumonia, sepsis, he was further diagnosed with perineal abscess, drained by urology.  He was seen by PCCM, urology, ID.  He was stabilized and transferred to my care on 05/28/2024 on day 16 of his hospital stay.    Still has a NG tube, still appears extremely weak dehydrated and deconditioned, extremely poor cough reflex.    Significant Hospital Events:    8/8 - Admit w/ COPD, HMPV infection, intubated. 8/13 - Weaning on vent, PSV 10/5, mild tachypnea. Remains on Precedex . 8/15 Developed perineal abscess.  Underwent cystoscopy and I&D with purulent drainage, wound packed 8/16 Failing PSV, on low dose levoped 8/19: failed SBT, off vasopressors. Treated for hyperkalemia 8/20: tol PS wean  05/25/2024 extubating 05/28/2024.  Transferred to [facility]     Subjective:   Patient in bed, appears comfortable, denies any headache, no fever, no chest pain or  pressure, no shortness of breath , no abdominal pain. No new focal weakness.  No significant events as discussed with staff    Assessment  & Plan :    Sepsis upon admission due to combination of metapneumovirus and Pseudomonas pneumonia, perineal abscess and with Candida glabrata fungemia - he is initially admitted to ICU and intubated, seen by urology, ID.  He underwent incision and drainage  by urology on 05/19/2024.  Currently he has finished his antibiotics & micafungin , urology assisting with wound care for his perineal abscess, still quite weak and deconditioned monitor closely.  Metapneumovirus pneumonia, weak cough reflex, at risk for aspiration, dysphagia.   -Currently on tube feeds along with free water  flushes, cough reflex quite poor, have requested speech therapy to evaluate, okay remains at risk for aspiration pneumonia monitor closely.  - Keep encouraging to use incentive spirometry, flutter valve, get out of bed.  He has continuous respiratory status, and high risk for decompensation.  Acute encephalopathy  Hospital delirium -Due to above and mild hypercapnia.  No headache or focal deficits, as needed Haldol , nighttime Seroquel , BiPAP for a few hours.    Severe dehydration with AKI - CKD 3B and hypernatremia.   - Improving, continue with IV fluids as needed .  Lung nodule noted on chest x-ray.   -CT chest ordered on 06/06/2024.  Will definitely require pulmonary follow-up postdischarge within a month.    Hyperkalemia.  Treated.    Dysphagia due to above.  - Followed by SLP, off tube feeds, NG discontinued, dysphagia 3 with thin liquids  Chronic diastolic CHF.  EF around 70% this admission on 05/14/2024.  Currently compensated.  Continue beta-blocker, got a trial of Lasix  on 06/05/2024 with excellent urine output, use Lasix  as needed, address daily  BPH - cont foley, on home Flomax .  Foley will be discontinued if urology wants to remove it, due to complex perineal wound.   HTN   -BID metop, PRN hydral   DM2  -oral intake is erratic, discontinue long-acting insulin  completely on 05/31/2024 as he is having episodes of severe hypoglycemia, sliding scale for now.    Lab Results  Component Value Date   HGBA1C See Scanned report in [place] Link 05/12/2024   CBG (last 3)  Recent Labs    06/06/24 1519 06/06/24 2105 06/07/24 0913  GLUCAP 167* 123* 111*         Condition - Extremely Guarded  Family Communication  :   None at bedside   Code Status : Full code   Consults  : PCCM, ID, urology  PUD Prophylaxis : PPI   Procedures  :            Disposition Plan  :    Status is: Inpatient   DVT Prophylaxis  :    heparin  injection 5,000 Units Start: 05/20/24 0600 SCDs Start: 05/12/24 1851   Lab Results  Component Value Date   PLT 168 06/07/2024    Diet :  Diet Order             DIET DYS 3 Room service appropriate? Yes; Fluid consistency: Thin  Diet effective now                    Inpatient Medications  Scheduled Meds:  arformoterol   15 mcg Nebulization BID   atorvastatin   40 mg Oral Daily   budesonide  (PULMICORT ) nebulizer solution  0.5 mg Nebulization BID   Chlorhexidine  Gluconate Cloth  6 each Topical Daily   doxazosin   4 mg Oral Daily   feeding supplement  237 mL Oral TID BM   heparin  injection (subcutaneous)  5,000 Units Subcutaneous Q8H   insulin  aspart  0-9 Units Subcutaneous TID WC   metoprolol  tartrate  25 mg Oral BID   multivitamin with minerals  1 tablet Oral Daily   nicotine   21 mg Transdermal Daily   mouth rinse  15  mL Mouth Rinse 4 times per day   pantoprazole   40 mg Oral Daily   QUEtiapine   25 mg Oral QHS   revefenacin   175 mcg Nebulization Daily   Continuous Infusions:   PRN Meds:.acetaminophen , albuterol , docusate, fentaNYL  (SUBLIMAZE ) injection, haloperidol  lactate, metoprolol  tartrate, mouth rinse, polyethylene glycol    Objective:   Vitals:   06/07/24 0500 06/07/24 0741 06/07/24 0904  06/07/24 0905  BP:   107/60   Pulse:   86 89  Resp:   (!) 42 19  Temp:      TempSrc:      SpO2:  94% 99% 98%  Weight: 58.2 kg     Height:        Wt Readings from Last 3 Encounters:  06/07/24 58.2 kg  10/28/23 70.3 kg  10/14/23 71.2 kg     Intake/Output Summary (Last 24 hours) at 06/07/2024 1033 Last data filed at 06/07/2024 0531 Gross per 24 hour  Intake 480 ml  Output 950 ml  Net -470 ml     Physical Exam  Frail elderly male, awake, extremely deconditioned, chronically appearing, confused  Symmetrical Chest wall movement, no wheezing RRR,No Gallops,Rubs or new Murmurs, No Parasternal Heave +ve B.Sounds, Abd Soft, No tenderness, No rebound - guarding or rigidity.   No Cyanosis, Foley catheter in place, right AKA       Data Review:    Recent Labs  Lab 06/03/24 0641 06/04/24 0813 06/05/24 0627 06/06/24 0533 06/07/24 0528  WBC 6.6 6.3 5.4 5.6 4.9  HGB 9.9* 9.8* 10.3* 10.8* 10.8*  HCT 33.0* 32.8* 33.4* 34.5* 35.8*  PLT 170 166 174 180 168  MCV 89.9 89.9 88.6 87.6 89.1  MCH 27.0 26.8 27.3 27.4 26.9  MCHC 30.0 29.9* 30.8 31.3 30.2  RDW 16.9* 17.2* 16.8* 17.0* 17.2*  LYMPHSABS 0.8 0.9 0.8 0.8 0.8  MONOABS 0.6 0.7 0.7 0.6 0.5  EOSABS 0.2 0.2 0.3 0.3 0.3  BASOSABS 0.1 0.1 0.0 0.0 0.0    Recent Labs  Lab 06/01/24 0639 06/02/24 0559 06/03/24 0641 06/04/24 0813 06/05/24 0627 06/06/24 0533 06/07/24 0528  NA 143 143 143 140 143 139 137  K 5.3* 5.1 4.2 4.8 5.2* 4.2 4.5  CL 107 105 104 103 101 96* 99  CO2 31 28 31 28 30 30 30   ANIONGAP 5 10 8 9 12 13 8   GLUCOSE 78 118* 102* 102* 99 121* 111*  BUN 59* 55* 46* 39* 41* 51* 64*  CREATININE 1.68* 1.88* 1.85* 1.85* 1.94* 2.06* 2.12*  AST 23 25  --   --   --   --   --   ALT 27 32  --   --   --   --   --   ALKPHOS 66 78  --   --   --   --   --   BILITOT 0.8 0.8  --   --   --   --   --   ALBUMIN 2.0* 2.0*  --   --   --   --   --   CRP 11.2*  --   --   --   --   --   --   PROCALCITON 0.39 0.30  --   --   --   --    --   BNP  --   --   --   --  91.3  --   --   MG 1.9  --  1.9 1.9 1.9 1.9 1.9  PHOS  3.5  --   --   --   --   --   --   CALCIUM  8.5* 8.6* 8.5* 8.5* 8.8* 8.7* 8.9      Recent Labs  Lab 06/01/24 9360 06/02/24 0559 06/03/24 0641 06/04/24 0813 06/05/24 0627 06/06/24 0533 06/07/24 0528  CRP 11.2*  --   --   --   --   --   --   PROCALCITON 0.39 0.30  --   --   --   --   --   BNP  --   --   --   --  91.3  --   --   MG 1.9  --  1.9 1.9 1.9 1.9 1.9  CALCIUM  8.5* 8.6* 8.5* 8.5* 8.8* 8.7* 8.9    --------------------------------------------------------------------------------------------------------------- Lab Results  Component Value Date   CHOL 82 05/12/2024   HDL 27 (L) 05/12/2024   LDLCALC 39 05/12/2024   TRIG 61 05/25/2024   CHOLHDL 3.0 05/12/2024    Lab Results  Component Value Date   HGBA1C See Scanned report in [facility] [place] Link 05/12/2024     Signature  -   Brayton Lycan Davee M.D on 06/07/2024 at 10:33 AM   -  To page go to [email or web address]

## 2024-06-07 NOTE — Discharge Instructions (Signed)
 Follow with Primary MD Gil Greig BRAVO, NP in 7 days   Get CBC, CMP,  checked  by Primary MD next visit.    Activity: As tolerated with Full fall precautions use walker/cane & assistance as needed   Disposition Home    Diet: Heart Healthy  For Heart failure patients - Check your Weight same time everyday, if you gain over 2 pounds, or you develop in leg swelling, experience more shortness of breath or chest pain, call your Primary MD immediately. Follow Cardiac Low Salt Diet and 1.5 lit/day fluid restriction.   On your next visit with your primary care physician please Get Medicines reviewed and adjusted.   Please request your Prim.MD to go over all Hospital Tests and Procedure/Radiological results at the follow up, please get all Hospital records sent to your Prim MD by signing hospital release before you go home.   If you experience worsening of your admission symptoms, develop shortness of breath, life threatening emergency, suicidal or homicidal thoughts you must seek medical attention immediately by calling 911 or calling your MD immediately  if symptoms less severe.  You Must read complete instructions/literature along with all the possible adverse reactions/side effects for all the Medicines you take and that have been prescribed to you. Take any new Medicines after you have completely understood and accpet all the possible adverse reactions/side effects.   Do not drive, operating heavy machinery, perform activities at heights, swimming or participation in water  activities or provide baby sitting services if your were admitted for syncope or siezures until you have seen by Primary MD or a Neurologist and advised to do so again.  Do not drive when taking Pain medications.    Do not take more than prescribed Pain, Sleep and Anxiety Medications  Special Instructions: If you have smoked or chewed Tobacco  in the last 2 yrs please stop smoking, stop any regular Alcohol  and or any  Recreational drug use.  Wear Seat belts while driving.   Please note  You were cared for by a hospitalist during your hospital stay. If you have any questions about your discharge medications or the care you received while you were in the hospital after you are discharged, you can call the unit and asked to speak with the hospitalist on call if the hospitalist that took care of you is not available. Once you are discharged, your primary care physician will handle any further medical issues. Please note that NO REFILLS for any discharge medications will be authorized once you are discharged, as it is imperative that you return to your primary care physician (or establish a relationship with a primary care physician if you do not have one) for your aftercare needs so that they can reassess your need for medications and monitor your lab values.

## 2024-06-07 NOTE — TOC Progression Note (Addendum)
 Transition of Care [facility]) - Progression Note    Patient Details  Name: Clarence Maldonado MRN: [id number] Date of Birth: 1946/05/19  [facility] CM/SW Contact  Inocente GORMAN Kindle, LCSW Phone Number: 06/07/2024, 8:47 AM  Clinical Narrative:    CSW provided update to [employer].   CSW initiated insurance authorization for [facility], Ref# [id number]. Denial will not preclude patient from discharging as he is currently long term care at [facility].    Expected Discharge Plan: Long Term Nursing Home (From [employer]) Barriers to Discharge: Continued Medical Work up               Expected Discharge Plan and Services In-house Referral: Clinical Social Work     Living arrangements for the past 2 months: Skilled Nursing Facility                                       Social Drivers of Health (SDOH) Interventions SDOH Screenings   Food Insecurity: No Food Insecurity (10/20/2023)  Housing: Unknown (10/20/2023)  Transportation Needs: No Transportation Needs (10/20/2023)  Utilities: Not At Risk (10/20/2023)  Depression (PHQ2-9): Low Risk  (10/14/2023)  Financial Resource Strain: Low Risk  (03/23/2019)  Physical Activity: Inactive (03/23/2019)  Social Connections: Socially Isolated (03/23/2019)  Stress: No Stress Concern Present (03/23/2019)  Tobacco Use: Medium Risk (05/19/2024)    Readmission Risk Interventions     No data to display

## 2024-06-07 NOTE — Progress Notes (Signed)
   06/07/24 1326  Mobility  Activity Dangled on edge of bed  Level of Assistance Maximum assist, patient does 25-49%  Assistive Device Other (Comment) (Bedrails)  Activity Response Tolerated fair  Mobility Referral Yes  Mobility visit 1 Mobility  Mobility Specialist Start Time (ACUTE ONLY) 1326  Mobility Specialist Stop Time (ACUTE ONLY) 1334  Mobility Specialist Time Calculation (min) (ACUTE ONLY) 8 min   Mobility Specialist: Progress Note  Pre-Mobility:      HR 76, SpO2 98% 3L Post-Mobility:    HR 78, SpO2 100% 3L  Pt agreeable to mobility session - received in bed. C/o weakness, fatigue, SOB, VSS. Returned to bed with all needs met - call bell within reach. Bed alarm on.   Virgle Boards, BS Mobility Specialist Please contact via SecureChat or  Rehab office at [phone].SABRA

## 2024-06-07 NOTE — Progress Notes (Signed)
 Occupational Therapy Treatment Patient Details Name: Clarence Maldonado MRN: [id number] DOB: [birth year] Today's Date: 06/07/2024   History of present illness 78 year old man who presented via EMS 8/8 for acute respiratory failure. EMS found in severe resp distress unresponsive. Pt intubated on arrival to [facility] and taken to ICU. Admitted for treatment of COPD, and HMPV infection. Developed perineal abscess, 8/15 underwent cystoscopy with I&D. Extubated 8/21  PMHx significant for HTN, HLD, COPD, CKD 3b, DMT2, BPH, R AKA, anxiety/depression.   OT comments  Patient making progress towards goals. Patient seen in conjunction with mobility specialist in order to progress with transfers and bed mobility. Patient much more alert and conversant in session, with HR and O2 stable at 3L O2. Patient transition to EOB with mod A of 2 , but able to maintain sitting balance at [facility] for 10+ minutes. BP assessed in sitting at 95/60 (72). Patient agreeable to standing attempt requiring max A of 2 to complete. Despite encouragement, patient declining sitting in recliner, therefore bed placed in chair position in bed at end of session. OT recommendation remains appropriate; OT will continue to follow.       If plan is discharge home, recommend the following:  Two people to help with walking and/or transfers;Two people to help with bathing/dressing/bathroom;Assistance with cooking/housework;Assistance with feeding;Direct supervision/assist for medications management;Assist for transportation;Direct supervision/assist for financial management;Help with stairs or ramp for entrance;Supervision due to cognitive status   Equipment Recommendations  Other (comment) (defer to next venue)    Recommendations for Other Services      Precautions / Restrictions Precautions Precautions: Fall Recall of Precautions/Restrictions: Impaired Restrictions Weight Bearing Restrictions Per Provider Order: No       Mobility Bed  Mobility Overal bed mobility: Needs Assistance Bed Mobility: Supine to Sit, Sit to Supine     Supine to sit: Mod assist, +2 for physical assistance, +2 for safety/equipment Sit to supine: Mod assist, +2 for physical assistance, +2 for safety/equipment   General bed mobility comments: support at trunk to come into sitting, CGA sittting EOB for 10+ minutes, and mod A of 2 to return to supine    Transfers Overall transfer level: Needs assistance     Sit to Stand: Max assist, +2 physical assistance, +2 safety/equipment           General transfer comment: Max A of 2 to complete sit<>stand x1, patient politely declining further attempts despite encouragement     Balance Overall balance assessment: Needs assistance Sitting-balance support: Bilateral upper extremity supported, Feet supported Sitting balance-Leahy Scale: Fair Sitting balance - Comments: improved balance, did not challenge   Standing balance support: Bilateral upper extremity supported, During functional activity Standing balance-Leahy Scale: Poor Standing balance comment: Reliant on external support                           ADL either performed or assessed with clinical judgement   ADL Overall ADL's : Needs assistance/impaired     Grooming: Wash/dry hands;Wash/dry face;Sitting;Brushing hair;Minimal assistance;Applying deodorant Grooming Details (indicate cue type and reason): min A to comb hair and deoderant         Upper Body Dressing : Minimal assistance Upper Body Dressing Details (indicate cue type and reason): doff and don new gown     Toilet Transfer: +2 for physical assistance;+2 for safety/equipment;Maximal assistance Toilet Transfer Details (indicate cue type and reason): sit<>stand from EOB         Functional mobility during ADLs:  Maximal assistance;+2 for physical assistance;+2 for safety/equipment;Cueing for safety;Cueing for sequencing General ADL Comments: Patient making  progress towards goals. Patient seen in conjunction with mobility specialist in order to progress with transfers and bed mobility. Patient much more alert and conversant in session, with HR and O2 stable at 3L O2. Patient transition to EOB with mod A of 2 , but able to maintain sitting balance at [facility] for 10+ minutes. BP assessed in sitting at 95/60 (72). Patient agreeable to standing attempt requiring max A of 2 to complete. Despite encouragement, patient declining sitting in reclier, therefore bed placed in chair position in bed at end of session. OT recommendation remains appropriate; OT will continue to follow.    Extremity/Trunk Assessment              Occupational psychologist Communication: Impaired Factors Affecting Communication: Difficulty expressing self;Reduced clarity of speech   Cognition Arousal: Alert Behavior During Therapy: WFL for tasks assessed/performed Cognition: Cognition impaired   Orientation impairments: Place, Time, Situation Awareness: Intellectual awareness impaired, Online awareness impaired Memory impairment (select all impairments): Short-term memory, Working memory Attention impairment (select first level of impairment): Selective attention Executive functioning impairment (select all impairments): Problem solving, Reasoning, Initiation OT - Cognition Comments: Improved cognition this session, conversant, does not recall where or why he is here, but improved executive functioning throughout                 Following commands: Impaired Following commands impaired: Follows one step commands with increased time, Follows one step commands inconsistently      Cueing   Cueing Techniques: Verbal cues, Tactile cues, Visual cues  Exercises      Shoulder Instructions       General Comments VSS on 3L O2    Pertinent Vitals/ Pain       Pain Assessment Pain Assessment: Faces Faces Pain Scale: Hurts a  little bit Pain Location: generalized with movement Pain Descriptors / Indicators: Discomfort, Grimacing Pain Intervention(s): Limited activity within patient's tolerance, Monitored during session, Repositioned  Home Living Family/patient expects to be discharged to:: Skilled nursing facility                                        Prior Functioning/Environment              Frequency  Min 2X/week        Progress Toward Goals  OT Goals(current goals can now be found in the care plan section)  Progress towards OT goals: Progressing toward goals  Acute Rehab OT Goals Patient Stated Goal: to get better OT Goal Formulation: With patient Time For Goal Achievement: 06/23/24 Potential to Achieve Goals: Fair  Plan      Co-evaluation                 AM-PAC OT 6 Clicks Daily Activity     Outcome Measure   Help from another person eating meals?: A Little Help from another person taking care of personal grooming?: A Lot Help from another person toileting, which includes using toliet, bedpan, or urinal?: Total Help from another person bathing (including washing, rinsing, drying)?: A Lot Help from another person to put on and taking off regular upper body clothing?: A Lot Help from another person to put on and taking off  regular lower body clothing?: Total 6 Click Score: 11    End of Session Equipment Utilized During Treatment: Gait belt  OT Visit Diagnosis: Unsteadiness on feet (R26.81);Other abnormalities of gait and mobility (R26.89);Muscle weakness (generalized) (M62.81);Other symptoms and signs involving cognitive function;Adult, failure to thrive (R62.7)   Activity Tolerance Patient tolerated treatment well   Patient Left in bed;with call bell/phone within reach;with bed alarm set   Nurse Communication Mobility status        Time: 8942-8875 OT Time Calculation (min): 27 min  Charges: OT General Charges $OT Visit: 1 Visit OT  Treatments $Self Care/Home Management : 23-37 mins  Ronal Gift E. Nakiea Metzner, OTR/L Acute Rehabilitation Services [phone]   Ronal Gift Salt 06/07/2024, 3:38 PM

## 2024-06-08 DIAGNOSIS — J9602 Acute respiratory failure with hypercapnia: Secondary | ICD-10-CM | POA: Diagnosis not present

## 2024-06-08 LAB — CBC WITH DIFFERENTIAL/PLATELET
Abs Immature Granulocytes: 0.03 K/uL (ref 0.00–0.07)
Basophils Absolute: 0 K/uL (ref 0.0–0.1)
Basophils Relative: 1 %
Eosinophils Absolute: 0.4 K/uL (ref 0.0–0.5)
Eosinophils Relative: 7 %
HCT: 35.5 % — ABNORMAL LOW (ref 39.0–52.0)
Hemoglobin: 10.8 g/dL — ABNORMAL LOW (ref 13.0–17.0)
Immature Granulocytes: 1 %
Lymphocytes Relative: 18 %
Lymphs Abs: 1 K/uL (ref 0.7–4.0)
MCH: 27.1 pg (ref 26.0–34.0)
MCHC: 30.4 g/dL (ref 30.0–36.0)
MCV: 89 fL (ref 80.0–100.0)
Monocytes Absolute: 0.5 K/uL (ref 0.1–1.0)
Monocytes Relative: 10 %
Neutro Abs: 3.3 K/uL (ref 1.7–7.7)
Neutrophils Relative %: 63 %
Platelets: 176 K/uL (ref 150–400)
RBC: 3.99 MIL/uL — ABNORMAL LOW (ref 4.22–5.81)
RDW: 17.2 % — ABNORMAL HIGH (ref 11.5–15.5)
WBC: 5.2 K/uL (ref 4.0–10.5)
nRBC: 0 % (ref 0.0–0.2)

## 2024-06-08 LAB — BASIC METABOLIC PANEL WITH GFR
Anion gap: 14 (ref 5–15)
BUN: 59 mg/dL — ABNORMAL HIGH (ref 8–23)
CO2: 29 mmol/L (ref 22–32)
Calcium: 8.9 mg/dL (ref 8.9–10.3)
Chloride: 97 mmol/L — ABNORMAL LOW (ref 98–111)
Creatinine, Ser: 2.27 mg/dL — ABNORMAL HIGH (ref 0.61–1.24)
GFR, Estimated: 29 mL/min — ABNORMAL LOW (ref >60)
Glucose, Bld: 110 mg/dL — ABNORMAL HIGH (ref 70–99)
Potassium: 5 mmol/L (ref 3.5–5.1)
Sodium: 140 mmol/L (ref 135–145)

## 2024-06-08 LAB — MAGNESIUM: Magnesium: 1.9 mg/dL (ref 1.7–2.4)

## 2024-06-08 LAB — GLUCOSE, CAPILLARY
Glucose-Capillary: 114 mg/dL — ABNORMAL HIGH (ref 70–99)
Glucose-Capillary: 256 mg/dL — ABNORMAL HIGH (ref 70–99)
Glucose-Capillary: 82 mg/dL (ref 70–99)
Glucose-Capillary: 241 mg/dL — ABNORMAL HIGH (ref 70–99)
Glucose-Capillary: >600 mg/dL (ref 70–99)

## 2024-06-08 LAB — BRAIN NATRIURETIC PEPTIDE: B Natriuretic Peptide: 53.4 pg/mL (ref 0.0–100.0)

## 2024-06-08 MED ORDER — DOCUSATE SODIUM 100 MG PO CAPS
100.0000 mg | ORAL_CAPSULE | Freq: Two times a day (BID) | ORAL | Status: DC
  Administered 2024-06-08 (×2): 100 mg via ORAL
  Filled 2024-06-08 (×2): qty 1

## 2024-06-08 MED ORDER — FUROSEMIDE 20 MG PO TABS
20.0000 mg | ORAL_TABLET | Freq: Every day | ORAL | Status: DC
  Administered 2024-06-08 – 2024-06-09 (×2): 20 mg via ORAL
  Filled 2024-06-08 (×2): qty 1

## 2024-06-08 MED ORDER — SODIUM ZIRCONIUM CYCLOSILICATE 5 G PO PACK
5.0000 g | PACK | Freq: Once | ORAL | Status: AC
  Administered 2024-06-08: 5 g via ORAL
  Filled 2024-06-08 (×4): qty 1

## 2024-06-08 NOTE — Progress Notes (Signed)
   06/08/24 0827  Mobility  Activity Pivoted/transferred from bed to chair  Level of Assistance Maximum assist, patient does 25-49%  Assistive Device Other (Comment) (HHA)  Activity Response Tolerated fair  Mobility Referral Yes  Mobility visit 1 Mobility  Mobility Specialist Start Time (ACUTE ONLY) 0827  Mobility Specialist Stop Time (ACUTE ONLY) 0844  Mobility Specialist Time Calculation (min) (ACUTE ONLY) 17 min   Mobility Specialist: Progress Note  Pre-Mobility:      HR 92, SpO2 88% RA Post-Mobility:    HR 100, SpO2 87% RA  Pt agreeable to mobility session - received in bed. Pt was asymptomatic throughout session with no complaints. Returned to chair with all needs met - call bell within reach. Chair alarm on. Required heavy encouragement for OOB transfer. Pt with desat- RN aware.   Clarence Maldonado, BS Mobility Specialist Please contact via SecureChat or  Rehab office at [phone].

## 2024-06-08 NOTE — Plan of Care (Signed)
 Pt has rested quietly throughout the night with no distress noted. Alert and oriented to person. On O2 4lNC. SR on the monitor. Foley cath intact to BSD. Pt tried having a BM on bedpan but states he can't go yet. No other complaints voiced.     Problem: Respiratory: Goal: Ability to maintain a clear airway and adequate ventilation will improve Outcome: Progressing   Problem: Clinical Measurements: Goal: Respiratory complications will improve Outcome: Progressing Goal: Cardiovascular complication will be avoided Outcome: Progressing   Problem: Activity: Goal: Risk for activity intolerance will decrease Outcome: Progressing   Problem: Pain Managment: Goal: General experience of comfort will improve and/or be controlled Outcome: Progressing

## 2024-06-08 NOTE — Progress Notes (Signed)
 PROGRESS NOTE                                                                                                                                                                                                             Patient Demographics:    Clarence Maldonado, is a 78 y.o. male, DOB - 1946/01/11, FMW:[id number]  Outpatient Primary MD for the patient is Gil Greig BRAVO, NP    LOS - 27  Admit date - 05/12/2024    Chief Complaint  Patient presents with   Respiratory Distress       Brief Narrative (HPI from H&P)    78 year old man who presented to [facility] 8/8 for acute respiratory failure. PMHx significant for HTN, HLD, COPD, CKD 3b, DMT2, BPH, R AKA, anxiety/depression.   Patient resides at rehab facility. Having SOB on 8/8 and prescribed rocephin /azithro. Worsening SOB. EMS called found in severe resp distress unresponsive. LMA placed and being bagged en route to [facility] ED. Wheezing b/l. Patient intubated on arrival, he was kept in ICU, his workup was suggestive of metapneumovirus pneumonia, sepsis, he was further diagnosed with perineal abscess, drained by urology.  He was seen by PCCM, urology, ID.  He was stabilized and transferred to my care on 05/28/2024 on day 16 of his hospital stay.    Still has a NG tube, still appears extremely weak dehydrated and deconditioned, extremely poor cough reflex.    Significant Hospital Events:    8/8 - Admit w/ COPD, HMPV infection, intubated. 8/13 - Weaning on vent, PSV 10/5, mild tachypnea. Remains on Precedex . 8/15 Developed perineal abscess.  Underwent cystoscopy and I&D with purulent drainage, wound packed 8/16 Failing PSV, on low dose levoped 8/19: failed SBT, off vasopressors. Treated for hyperkalemia 8/20: tol PS wean  05/25/2024 extubating 05/28/2024.  Transferred to [facility]     Subjective:   Patient in bed, appears comfortable, significant events overnight as discussed with staff,  this morning he denies any complaints.     Assessment  & Plan :    Sepsis upon admission due to combination of metapneumovirus and Pseudomonas pneumonia, perineal abscess and with Candida glabrata fungemia - he is initially admitted to ICU and intubated, seen by urology, ID.  He underwent incision and drainage by urology on 05/19/2024.  Currently he has finished his antibiotics & micafungin , urology assisting with  wound care for his perineal abscess.  Metapneumovirus pneumonia, weak cough reflex, at risk for aspiration, dysphagia.   - Initially on tube feeds along with free water  flushes, cough reflex quite poor, have requested speech therapy to evaluate, okay remains at risk for aspiration pneumonia monitor closely.  - Keep encouraging to use incentive spirometry, flutter valve, get out of bed.  He has continuous respiratory status, and high risk for decompensation. - He has been encouraged to use incentive spirometer, flutter valve, and ambulated out of bed to chair daily, he is on room air, peers to be improving.  Acute metabolic encephalopathy  Hospital delirium - Overall much improved, he is currently on low-dose Seroquel  at bedtime, does not require any as needed meds .  Acute respiratory failure with hypoxia and hypercapnia on admission -Due to sepsis on presentation -This has resolved, he is currently on room air, no BiPAP requirement  Severe dehydration with AKI - CKD 3B and hypernatremia.   - Improving, continue with IV fluids as needed .  Lung nodule noted on chest x-ray.   -CT chest ordered on 06/06/2024.  Will definitely require pulmonary follow-up postdischarge within a month.    Hyperkalemia.  Treated.    Dysphagia due to above.  - Followed by SLP, off tube feeds, NG discontinued, dysphagia 3 with thin liquids  Chronic diastolic CHF.  EF around 70% this admission on 05/14/2024.  Currently compensated.  Continue beta-blocker, got a trial of Lasix  on 06/05/2024 with excellent  urine output, on Lasix  as needed, volume status has improved, will continue with low-dose Lasix  20 mg oral daily.   BPH  - cont foley, on home Flomax .  Foley will be discontinued if urology wants to remove it, due to complex perineal wound.   HTN   -BID metop, PRN hydral   DM2  -oral intake is erratic, discontinue long-acting insulin  completely on 05/31/2024 as he is having episodes of severe hypoglycemia, sliding scale for now.    Lab Results  Component Value Date   HGBA1C See Scanned report in  Link 05/12/2024   CBG (last 3)  Recent Labs    06/07/24 1101 06/07/24 1553 06/08/24 0811  GLUCAP 186* 202* 114*         Condition - Extremely Guarded  Family Communication  :   None at bedside  Code Status : Full code   Consults  : PCCM, ID, urology  PUD Prophylaxis : PPI   Procedures  :            Disposition Plan  :    Status is: Inpatient   DVT Prophylaxis  :    heparin  injection 5,000 Units Start: 05/20/24 0600 SCDs Start: 05/12/24 1851   Lab Results  Component Value Date   PLT 176 06/08/2024    Diet :  Diet Order             DIET DYS 3 Room service appropriate? Yes; Fluid consistency: Thin  Diet effective now                    Inpatient Medications  Scheduled Meds:  arformoterol   15 mcg Nebulization BID   atorvastatin   40 mg Oral Daily   budesonide  (PULMICORT ) nebulizer solution  0.5 mg Nebulization BID   Chlorhexidine  Gluconate Cloth  6 each Topical Daily   doxazosin   4 mg Oral Daily   feeding supplement  237 mL Oral TID BM   furosemide   20 mg Oral Daily  heparin  injection (subcutaneous)  5,000 Units Subcutaneous Q8H   insulin  aspart  0-9 Units Subcutaneous TID WC   metoprolol  tartrate  25 mg Oral BID   multivitamin with minerals  1 tablet Oral Daily   nicotine   21 mg Transdermal Daily   mouth rinse  15 mL Mouth Rinse 4 times per day   pantoprazole   40 mg Oral Daily   QUEtiapine   25 mg Oral QHS   revefenacin   175  mcg Nebulization Daily   Continuous Infusions:   PRN Meds:.acetaminophen , albuterol , docusate, fentaNYL  (SUBLIMAZE ) injection, metoprolol  tartrate, mouth rinse, polyethylene glycol    Objective:   Vitals:   06/08/24 0400 06/08/24 0500 06/08/24 0739 06/08/24 0812  BP: (!) 107/53   110/73  Pulse:    94  Resp: 17   (!) 23  Temp: 98.2 F (36.8 C)   98.1 F (36.7 C)  TempSrc: Oral   Oral  SpO2: 96%  91% (!) 87%  Weight:  57.3 kg    Height:        Wt Readings from Last 3 Encounters:  06/08/24 57.3 kg  10/28/23 70.3 kg  10/14/23 71.2 kg     Intake/Output Summary (Last 24 hours) at 06/08/2024 0926 Last data filed at 06/08/2024 0542 Gross per 24 hour  Intake 480 ml  Output 1650 ml  Net -1170 ml     Physical Exam  Awake, alert, extremely frail, deconditioned, chronically ill-appearing, oriented x 1  Good air entry bilaterally, no wheezing  Regular rate and rhythm +ve B.Sounds, Abd Soft, No tenderness, No rebound - guarding or rigidity.   No Cyanosis, Foley catheter in place, right AKA       Data Review:    Recent Labs  Lab 06/04/24 0813 06/05/24 0627 06/06/24 0533 06/07/24 0528 06/08/24 0501  WBC 6.3 5.4 5.6 4.9 5.2  HGB 9.8* 10.3* 10.8* 10.8* 10.8*  HCT 32.8* 33.4* 34.5* 35.8* 35.5*  PLT 166 174 180 168 176  MCV 89.9 88.6 87.6 89.1 89.0  MCH 26.8 27.3 27.4 26.9 27.1  MCHC 29.9* 30.8 31.3 30.2 30.4  RDW 17.2* 16.8* 17.0* 17.2* 17.2*  LYMPHSABS 0.9 0.8 0.8 0.8 1.0  MONOABS 0.7 0.7 0.6 0.5 0.5  EOSABS 0.2 0.3 0.3 0.3 0.4  BASOSABS 0.1 0.0 0.0 0.0 0.0    Recent Labs  Lab 06/02/24 0559 06/03/24 0641 06/04/24 0813 06/05/24 0627 06/06/24 0533 06/07/24 0528 06/08/24 0501  NA 143   < > 140 143 139 137 140  K 5.1   < > 4.8 5.2* 4.2 4.5 5.0  CL 105   < > 103 101 96* 99 97*  CO2 28   < > 28 30 30 30 29   ANIONGAP 10   < > 9 12 13 8 14   GLUCOSE 118*   < > 102* 99 121* 111* 110*  BUN 55*   < > 39* 41* 51* 64* 59*  CREATININE 1.88*   < > 1.85* 1.94* 2.06*  2.12* 2.27*  AST 25  --   --   --   --   --   --   ALT 32  --   --   --   --   --   --   ALKPHOS 78  --   --   --   --   --   --   BILITOT 0.8  --   --   --   --   --   --   ALBUMIN 2.0*  --   --   --   --   --   --  PROCALCITON 0.30  --   --   --   --   --   --   BNP  --   --   --  91.3  --   --  53.4  MG  --    < > 1.9 1.9 1.9 1.9 1.9  CALCIUM  8.6*   < > 8.5* 8.8* 8.7* 8.9 8.9   < > = values in this interval not displayed.      Recent Labs  Lab 06/02/24 0559 06/03/24 9358 06/04/24 0813 06/05/24 0627 06/06/24 0533 06/07/24 0528 06/08/24 0501  PROCALCITON 0.30  --   --   --   --   --   --   BNP  --   --   --  91.3  --   --  53.4  MG  --    < > 1.9 1.9 1.9 1.9 1.9  CALCIUM  8.6*   < > 8.5* 8.8* 8.7* 8.9 8.9   < > = values in this interval not displayed.    --------------------------------------------------------------------------------------------------------------- Lab Results  Component Value Date   CHOL 82 05/12/2024   HDL 27 (L) 05/12/2024   LDLCALC 39 05/12/2024   TRIG 61 05/25/2024   CHOLHDL 3.0 05/12/2024    Lab Results  Component Value Date   HGBA1C See Scanned report in [place] Link 05/12/2024     Signature  -   Brayton Coriana Angello M.D on 06/08/2024 at 9:26 AM   -  To page go to [email or web address]

## 2024-06-08 NOTE — Progress Notes (Signed)
 Nutrition Follow-up  DOCUMENTATION CODES:   Severe malnutrition in context of chronic illness  INTERVENTION:  Ensure Plus High Protein po TID, each supplement provides 350 kcal and 20 grams of protein Multivitamin w/ minerals daily Feeding assist/encouragement with all meals Continue MVI w/ mnerals Add Magic cup TID with meals, each supplement provides 290 kcal and 9 grams of protein   NUTRITION DIAGNOSIS:  Severe Malnutrition related to chronic illness (COPD) as evidenced by severe muscle depletion, severe fat depletion.  GOAL:  Patient will meet greater than or equal to 90% of their needs  MONITOR:  PO intake, Supplement acceptance, Labs, Weight trends  REASON FOR ASSESSMENT:  Ventilator, Consult Assessment of nutrition requirement/status  ASSESSMENT:  Pt admitted from rehab with worsening shortness of breath, found to have severe respiratory distress and unresponsive. PMH significant for COPD, CKD 3b, T2DM, HTN, HLD, right AKA, anxiety/depression.  8/8: admitted with COPD exacerbation, intubated 8/11: Cortrak placement (tip in the duodenum) 8/15: s/p cystoscopy and I&D of perineal abscess 8/21: extubated 8/23: transferred to floor; diet advanced to full lqiuids 8/24: SLP BSE; diet advanced to dysphagia 1, thin liquids; Cortrak removed 8/28: diet advanced to DYS3/thins 9/02: CT chest: new moderate size opacity posteriorly in RLL c/f PNA   Remains on dysphagia 3 diet. Tolerating with no reported issues with chewing or swallowing. RN reports good breakfast intake, but currently refusing lunch saying he will eat later. He endorses adequate appetite, but unreliable historian. Supplementation continues.   Admit weight: 61.9 kg Current weight: 57.3 kg  +non-pitting generalized & BLE edema  Good UOP with 1.6L documented yesterday. Volume status improving. Continues on low dose Lasix . Net negative 9L this admission. Some edema continues. Bowels stable.   Intake/Output  Summary (Last 24 hours) at 06/08/2024 1359 Last data filed at 06/08/2024 0542 Gross per 24 hour  Intake 480 ml  Output 1650 ml  Net -1170 ml     Net IO Since Admission: -9,141 mL [06/08/24 1359]     Nutrition Related Medications: Colace, furosemide , Novolog  0-9 units TID, MVI, Protonix , Lokelma  x1  Labs:  Sodium 140  Potassium 5.0 BUN 51>64>59 (H) Creatinine 2.06>2.12>2.27 (H)  Magnesium  1.9  CRP 11.2 (8/28) CBG: 110-111 mg/dL x 24 hrs  J8r 5.7 (87/7975)  Diet Order:   Diet Order             DIET DYS 3 Room service appropriate? Yes; Fluid consistency: Thin  Diet effective now            EDUCATION NEEDS:  No education needs have been identified at this time  Skin:  Skin Assessment: Reviewed RN Assessment  Last BM:  9/3 - type 6 x1  Height:  Ht Readings from Last 1 Encounters:  05/23/24 5' 5 (1.651 m)    Weight:  Wt Readings from Last 1 Encounters:  06/08/24 57.3 kg   Ideal Body Weight:  61.2 kg  BMI:  Body mass index is 21.02 kg/m.  Estimated Nutritional Needs:   Kcal:  1600-1800  Protein:  95-110g  Fluid:  >/=1.6L  Blair Deaner MS, RD, LDN Registered Dietitian Clinical Nutrition RD Inpatient Contact Info in Amion

## 2024-06-08 NOTE — Progress Notes (Addendum)
   20 Days Post-Op Subjective: No acute events overnight.  Patient was resting in recliner on rounds.  Surgical site is still very tender on examination.  No purulent drainage  Objective: Vital signs in last 24 hours: Temp:  [97.9 F (36.6 C)-98.2 F (36.8 C)] 98.1 F (36.7 C) (09/04 0812) Pulse Rate:  [72-94] 94 (09/04 0812) Resp:  [15-23] 23 (09/04 0812) BP: (106-129)/(48-80) 110/73 (09/04 0812) SpO2:  [87 %-98 %] 87 % (09/04 0812) Weight:  [57.3 kg] 57.3 kg (09/04 0500)  Assessment/Plan:   #Perineal abscess   I&D with Dr. Selma on 05/19/2024.  Open scrotal wound with deep track towards pubis.  This is healing very nicely and has closed significantly during his hospitalization.  Daily wet-to-dry dressings can be completed by nursing at his SNF. Expect this to primarily close by secondary intention.    Continue doxazosin .  Voiding trial this morning.  If patient fails, please replace with 58f coud catheter.  Patient will need to follow-up in 2 to 3 weeks for assessment.  Please provide SNF with our contact information:  [facility] [address] second floor [place], [place]  [address] [phone]   Urology will follow peripherally.  Please feel free to call with questions or concerns  Intake/Output from previous day: 09/03 0701 - 09/04 0700 In: 480 [P.O.:480] Out: 1650 [Urine:1650]  Intake/Output this shift: No intake/output data recorded.  Physical Exam:  General: Intubated/sedated CV: No cyanosis Lungs: Ventilator dependent Abdomen: Soft, NTND, no rebound or guarding Gu: 80F Foley catheter in place draining clear yellow urine.  Lab Results: Recent Labs    06/06/24 0533 06/07/24 0528 06/08/24 0501  HGB 10.8* 10.8* 10.8*  HCT 34.5* 35.8* 35.5*   BMET Recent Labs    06/07/24 0528 06/08/24 0501  NA 137 140  K 4.5 5.0  CL 99 97*  CO2 30 29  GLUCOSE 111* 110*  BUN 64* 59*  CREATININE 2.12* 2.27*  CALCIUM  8.9 8.9  HGB  10.8* 10.8*  WBC 4.9 5.2     Studies/Results: No results found.     LOS: 27 days   Ole Bourdon, NP [facility] Pager: [phone]  06/08/2024, 10:38 AM   I have seen and examined the patient and agree with the above assessment and plan.  Wound healing well. Updated photos reviewed in chart as well. Closing nicely. Void trial ongoing was started late. He has had one unmeasured void. Discussed with RN to bladder scan and if retaining later this evening, to replace 16 Fr foley.  Matt R. Azavion Bouillon MD [facility]  Pager: [phone]

## 2024-06-08 NOTE — Progress Notes (Signed)
 Physical Therapy Treatment Patient Details Name: LOYCE KLASEN MRN: [id number] DOB: Feb 27, 1946 Today's Date: 06/08/2024   History of Present Illness 78 year old man who presented via EMS 8/8 for acute respiratory failure. EMS found in severe resp distress unresponsive. Pt intubated on arrival to [facility] and taken to ICU. Admitted for treatment of COPD, and HMPV infection. Developed perineal abscess, 8/15 underwent cystoscopy with I&D. Extubated 8/21  PMHx significant for HTN, HLD, COPD, CKD 3b, DMT2, BPH, R AKA, anxiety/depression.    PT Comments  Pt tolerated treatment well today. Pt today was able to perform 5xSTS with +2 Mod/Max A. No change in DC/DME recs at this time. PT will continue to follow.     If plan is discharge home, recommend the following: A lot of help with walking and/or transfers;A lot of help with bathing/dressing/bathroom;Assistance with cooking/housework;Direct supervision/assist for medications management;Direct supervision/assist for financial management;Assist for transportation;Help with stairs or ramp for entrance;Supervision due to cognitive status   Can travel by private vehicle     No  Equipment Recommendations  Other (comment) (TBD)    Recommendations for Other Services       Precautions / Restrictions Precautions Precautions: Fall Recall of Precautions/Restrictions: Impaired Restrictions Weight Bearing Restrictions Per Provider Order: No     Mobility  Bed Mobility Overal bed mobility: Needs Assistance Bed Mobility: Supine to Sit, Sit to Supine     Supine to sit: Min assist Sit to supine: Mod assist   General bed mobility comments: support at trunk to come into sitting, CGA sittting EOB for 10+ minutes, and mod A to return to supine.    Transfers Overall transfer level: Needs assistance Equipment used: 2 person hand held assist Transfers: Sit to/from Stand Sit to Stand: +2 physical assistance, Mod assist, Max assist           General  transfer comment: x5. +2 Mod A progressing to +2 Max with fatigue. Pt declined OOB transfer.    Ambulation/Gait                   Stairs             Wheelchair Mobility     Tilt Bed    Modified Rankin (Stroke Patients Only)       Balance Overall balance assessment: Needs assistance Sitting-balance support: Bilateral upper extremity supported, Feet supported Sitting balance-Leahy Scale: Fair Sitting balance - Comments: improved balance, did not challenge   Standing balance support: Bilateral upper extremity supported, During functional activity Standing balance-Leahy Scale: Poor                              Communication Communication Communication: Impaired Factors Affecting Communication: Difficulty expressing self;Reduced clarity of speech  Cognition Arousal: Alert Behavior During Therapy: WFL for tasks assessed/performed   PT - Cognitive impairments: No family/caregiver present to determine baseline, Awareness, Problem solving, Safety/Judgement Difficult to assess due to: Impaired communication Orientation impairments: Time, Situation                   PT - Cognition Comments: Pt appears to be easily distracted by many things. Tends to ramble however was able to follow commands more than previous session. Following commands: Impaired Following commands impaired: Follows one step commands with increased time, Follows one step commands inconsistently    Cueing Cueing Techniques: Verbal cues, Tactile cues, Visual cues  Exercises      General Comments General comments (skin integrity,  edema, etc.): VSS on 2L      Pertinent Vitals/Pain Pain Assessment Pain Assessment: No/denies pain    Home Living                          Prior Function            PT Goals (current goals can now be found in the care plan section) Progress towards PT goals: Progressing toward goals    Frequency    Min 2X/week      PT  Plan      Co-evaluation              AM-PAC PT 6 Clicks Mobility   Outcome Measure  Help needed turning from your back to your side while in a flat bed without using bedrails?: A Lot Help needed moving from lying on your back to sitting on the side of a flat bed without using bedrails?: A Lot Help needed moving to and from a bed to a chair (including a wheelchair)?: A Lot Help needed standing up from a chair using your arms (e.g., wheelchair or bedside chair)?: A Lot Help needed to walk in hospital room?: Total Help needed climbing 3-5 steps with a railing? : Total 6 Click Score: 10    End of Session Equipment Utilized During Treatment: Oxygen  Activity Tolerance: Patient tolerated treatment well Patient left: in bed;with call bell/phone within reach;with bed alarm set Nurse Communication: Mobility status;Need for lift equipment PT Visit Diagnosis: Difficulty in walking, not elsewhere classified (R26.2);Muscle weakness (generalized) (M62.81);Unsteadiness on feet (R26.81);Other symptoms and signs involving the nervous system (R29.898);Adult, failure to thrive (R62.7);Other abnormalities of gait and mobility (R26.89)     Time: 8646-8596 PT Time Calculation (min) (ACUTE ONLY): 10 min  Charges:    $Therapeutic Activity: 8-22 mins PT General Charges $$ ACUTE PT VISIT: 1 Visit                     Tymere Depuy B, PT, DPT Acute Rehab Services [phone]    Anam Bobby 06/08/2024, 4:00 PM

## 2024-06-08 NOTE — TOC Progression Note (Addendum)
 Transition of Care [facility]) - Progression Note    Patient Details  Name: Clarence Maldonado MRN: [id number] Date of Birth: April 22, 1946  Transition of Care [facility]) CM/SW Contact  Inocente GORMAN Kindle, LCSW Phone Number: 06/08/2024, 3:05 PM  Clinical Narrative:    3:05 PM-CSW received request to speak with patient's daughter. CSW left her a secure voicemail as it went straight to voicemail.   4:24 PM-Insurance authorization received for [facility], Ref# [id number], Auth ID# [id number], effective 06/09/2024-06/13/2024.     Expected Discharge Plan: Long Term Nursing Home (From [facility]) Barriers to Discharge: Continued Medical Work up               Expected Discharge Plan and Services In-house Referral: Clinical Social Work   Post Acute Care Choice: Skilled Nursing Facility Living arrangements for the past 2 months: Skilled Nursing Facility                                       Social Drivers of Health (SDOH) Interventions SDOH Screenings   Food Insecurity: No Food Insecurity (10/20/2023)  Housing: Unknown (10/20/2023)  Transportation Needs: No Transportation Needs (10/20/2023)  Utilities: Not At Risk (10/20/2023)  Depression (PHQ2-9): Low Risk  (10/14/2023)  Financial Resource Strain: Low Risk  (03/23/2019)  Physical Activity: Inactive (03/23/2019)  Social Connections: Socially Isolated (03/23/2019)  Stress: No Stress Concern Present (03/23/2019)  Tobacco Use: Medium Risk (05/19/2024)    Readmission Risk Interventions    06/08/2024    3:02 PM  Readmission Risk Prevention Plan  Transportation Screening Complete  Medication Review (RN Care Manager) Complete  PCP or Specialist appointment within 3-5 days of discharge Complete  HRI or Home Care Consult Complete  SW Recovery Care/Counseling Consult Complete  Palliative Care Screening Not Applicable  Skilled Nursing Facility Complete

## 2024-06-09 ENCOUNTER — Other Ambulatory Visit: Payer: Self-pay

## 2024-06-09 ENCOUNTER — Encounter (HOSPITAL_COMMUNITY): Payer: Self-pay

## 2024-06-09 ENCOUNTER — Emergency Department (HOSPITAL_COMMUNITY)

## 2024-06-09 ENCOUNTER — Inpatient Hospital Stay (HOSPITAL_COMMUNITY)
Admission: EM | Admit: 2024-06-09 | Discharge: 2024-06-15 | Disposition: A | Discharge status: Skilled Nursing Facility | Source: Skilled Nursing Facility | Attending: Internal Medicine | Admitting: Internal Medicine

## 2024-06-09 DIAGNOSIS — I69391 Dysphagia following cerebral infarction: Secondary | ICD-10-CM

## 2024-06-09 DIAGNOSIS — E875 Hyperkalemia: Secondary | ICD-10-CM | POA: Diagnosis present

## 2024-06-09 DIAGNOSIS — I5032 Chronic diastolic (congestive) heart failure: Secondary | ICD-10-CM | POA: Diagnosis present

## 2024-06-09 DIAGNOSIS — D631 Anemia in chronic kidney disease: Secondary | ICD-10-CM | POA: Diagnosis present

## 2024-06-09 DIAGNOSIS — F0393 Unspecified dementia, unspecified severity, with mood disturbance: Secondary | ICD-10-CM | POA: Diagnosis present

## 2024-06-09 DIAGNOSIS — H9190 Unspecified hearing loss, unspecified ear: Secondary | ICD-10-CM | POA: Diagnosis present

## 2024-06-09 DIAGNOSIS — E785 Hyperlipidemia, unspecified: Secondary | ICD-10-CM | POA: Diagnosis present

## 2024-06-09 DIAGNOSIS — R0603 Acute respiratory distress: Secondary | ICD-10-CM

## 2024-06-09 DIAGNOSIS — F17200 Nicotine dependence, unspecified, uncomplicated: Secondary | ICD-10-CM | POA: Diagnosis present

## 2024-06-09 DIAGNOSIS — Z8249 Family history of ischemic heart disease and other diseases of the circulatory system: Secondary | ICD-10-CM

## 2024-06-09 DIAGNOSIS — J449 Chronic obstructive pulmonary disease, unspecified: Secondary | ICD-10-CM | POA: Diagnosis present

## 2024-06-09 DIAGNOSIS — E874 Mixed disorder of acid-base balance: Secondary | ICD-10-CM | POA: Diagnosis present

## 2024-06-09 DIAGNOSIS — J9601 Acute respiratory failure with hypoxia: Secondary | ICD-10-CM | POA: Diagnosis present

## 2024-06-09 DIAGNOSIS — J690 Pneumonitis due to inhalation of food and vomit: Secondary | ICD-10-CM | POA: Diagnosis present

## 2024-06-09 DIAGNOSIS — Z89611 Acquired absence of right leg above knee: Secondary | ICD-10-CM

## 2024-06-09 DIAGNOSIS — Z9981 Dependence on supplemental oxygen: Secondary | ICD-10-CM

## 2024-06-09 DIAGNOSIS — F32A Depression, unspecified: Secondary | ICD-10-CM | POA: Diagnosis present

## 2024-06-09 DIAGNOSIS — I130 Hypertensive heart and chronic kidney disease with heart failure and stage 1 through stage 4 chronic kidney disease, or unspecified chronic kidney disease: Secondary | ICD-10-CM | POA: Diagnosis present

## 2024-06-09 DIAGNOSIS — E860 Dehydration: Secondary | ICD-10-CM | POA: Diagnosis present

## 2024-06-09 DIAGNOSIS — N179 Acute kidney failure, unspecified: Secondary | ICD-10-CM

## 2024-06-09 DIAGNOSIS — J9602 Acute respiratory failure with hypercapnia: Secondary | ICD-10-CM

## 2024-06-09 DIAGNOSIS — N1832 Chronic kidney disease, stage 3b: Secondary | ICD-10-CM | POA: Diagnosis present

## 2024-06-09 DIAGNOSIS — R338 Other retention of urine: Secondary | ICD-10-CM | POA: Diagnosis present

## 2024-06-09 DIAGNOSIS — R0902 Hypoxemia: Principal | ICD-10-CM

## 2024-06-09 DIAGNOSIS — Z79899 Other long term (current) drug therapy: Secondary | ICD-10-CM

## 2024-06-09 DIAGNOSIS — K219 Gastro-esophageal reflux disease without esophagitis: Secondary | ICD-10-CM | POA: Diagnosis present

## 2024-06-09 DIAGNOSIS — N400 Enlarged prostate without lower urinary tract symptoms: Secondary | ICD-10-CM | POA: Diagnosis present

## 2024-06-09 DIAGNOSIS — E43 Unspecified severe protein-calorie malnutrition: Secondary | ICD-10-CM | POA: Diagnosis present

## 2024-06-09 DIAGNOSIS — Z8611 Personal history of tuberculosis: Secondary | ICD-10-CM

## 2024-06-09 DIAGNOSIS — F0394 Unspecified dementia, unspecified severity, with anxiety: Secondary | ICD-10-CM | POA: Diagnosis present

## 2024-06-09 DIAGNOSIS — E1122 Type 2 diabetes mellitus with diabetic chronic kidney disease: Secondary | ICD-10-CM | POA: Diagnosis present

## 2024-06-09 DIAGNOSIS — N401 Enlarged prostate with lower urinary tract symptoms: Secondary | ICD-10-CM | POA: Diagnosis present

## 2024-06-09 DIAGNOSIS — I447 Left bundle-branch block, unspecified: Secondary | ICD-10-CM | POA: Diagnosis present

## 2024-06-09 DIAGNOSIS — I10 Essential (primary) hypertension: Secondary | ICD-10-CM | POA: Diagnosis present

## 2024-06-09 DIAGNOSIS — G9341 Metabolic encephalopathy: Secondary | ICD-10-CM | POA: Diagnosis present

## 2024-06-09 DIAGNOSIS — I2510 Atherosclerotic heart disease of native coronary artery without angina pectoris: Secondary | ICD-10-CM | POA: Diagnosis present

## 2024-06-09 DIAGNOSIS — Z8614 Personal history of Methicillin resistant Staphylococcus aureus infection: Secondary | ICD-10-CM

## 2024-06-09 DIAGNOSIS — Z7951 Long term (current) use of inhaled steroids: Secondary | ICD-10-CM

## 2024-06-09 DIAGNOSIS — N183 Chronic kidney disease, stage 3 unspecified: Secondary | ICD-10-CM | POA: Diagnosis present

## 2024-06-09 DIAGNOSIS — Z681 Body mass index (BMI) 19 or less, adult: Secondary | ICD-10-CM

## 2024-06-09 DIAGNOSIS — Z87891 Personal history of nicotine dependence: Secondary | ICD-10-CM

## 2024-06-09 LAB — I-STAT ARTERIAL BLOOD GAS, ED
Acid-Base Excess: 5 mmol/L — ABNORMAL HIGH (ref 0.0–2.0)
Bicarbonate: 31.2 mmol/L — ABNORMAL HIGH (ref 20.0–28.0)
Calcium, Ion: 1.18 mmol/L (ref 1.15–1.40)
HCT: 32 % — ABNORMAL LOW (ref 39.0–52.0)
Hemoglobin: 10.9 g/dL — ABNORMAL LOW (ref 13.0–17.0)
O2 Saturation: 91 %
Potassium: 5.4 mmol/L — ABNORMAL HIGH (ref 3.5–5.1)
Sodium: 136 mmol/L (ref 135–145)
TCO2: 33 mmol/L — ABNORMAL HIGH (ref 22–32)
pCO2 arterial: 53.8 mmHg — ABNORMAL HIGH (ref 32–48)
pH, Arterial: 7.371 (ref 7.35–7.45)
pO2, Arterial: 65 mmHg — ABNORMAL LOW (ref 83–108)

## 2024-06-09 LAB — I-STAT CG4 LACTIC ACID, ED: Lactic Acid, Venous: 2.4 mmol/L (ref 0.5–1.9)

## 2024-06-09 LAB — CBC WITH DIFFERENTIAL/PLATELET
Abs Immature Granulocytes: 0.03 K/uL (ref 0.00–0.07)
Abs Immature Granulocytes: 0.02 K/uL (ref 0.00–0.07)
Basophils Absolute: 0 K/uL (ref 0.0–0.1)
Basophils Absolute: 0 K/uL (ref 0.0–0.1)
Basophils Relative: 1 %
Basophils Relative: 1 %
Eosinophils Absolute: 0.3 K/uL (ref 0.0–0.5)
Eosinophils Absolute: 0.3 K/uL (ref 0.0–0.5)
Eosinophils Relative: 6 %
Eosinophils Relative: 4 %
HCT: 33.2 % — ABNORMAL LOW (ref 39.0–52.0)
HCT: 40.4 % (ref 39.0–52.0)
Hemoglobin: 10.1 g/dL — ABNORMAL LOW (ref 13.0–17.0)
Hemoglobin: 11.7 g/dL — ABNORMAL LOW (ref 13.0–17.0)
Immature Granulocytes: 1 %
Immature Granulocytes: 0 %
Lymphocytes Relative: 16 %
Lymphocytes Relative: 27 %
Lymphs Abs: 0.9 K/uL (ref 0.7–4.0)
Lymphs Abs: 1.6 K/uL (ref 0.7–4.0)
MCH: 26.8 pg (ref 26.0–34.0)
MCH: 27 pg (ref 26.0–34.0)
MCHC: 30.4 g/dL (ref 30.0–36.0)
MCHC: 29 g/dL — ABNORMAL LOW (ref 30.0–36.0)
MCV: 88.1 fL (ref 80.0–100.0)
MCV: 93.3 fL (ref 80.0–100.0)
Monocytes Absolute: 0.5 K/uL (ref 0.1–1.0)
Monocytes Absolute: 0.6 K/uL (ref 0.1–1.0)
Monocytes Relative: 10 %
Monocytes Relative: 11 %
Neutro Abs: 3.5 K/uL (ref 1.7–7.7)
Neutro Abs: 3.4 K/uL (ref 1.7–7.7)
Neutrophils Relative %: 66 %
Neutrophils Relative %: 57 %
Platelets: 139 K/uL — ABNORMAL LOW (ref 150–400)
Platelets: 134 K/uL — ABNORMAL LOW (ref 150–400)
RBC: 3.77 MIL/uL — ABNORMAL LOW (ref 4.22–5.81)
RBC: 4.33 MIL/uL (ref 4.22–5.81)
RDW: 17.6 % — ABNORMAL HIGH (ref 11.5–15.5)
RDW: 17.7 % — ABNORMAL HIGH (ref 11.5–15.5)
WBC: 5.3 K/uL (ref 4.0–10.5)
WBC: 5.9 K/uL (ref 4.0–10.5)
nRBC: 0 % (ref 0.0–0.2)
nRBC: 0 % (ref 0.0–0.2)

## 2024-06-09 LAB — BASIC METABOLIC PANEL WITH GFR
Anion gap: 10 (ref 5–15)
BUN: 57 mg/dL — ABNORMAL HIGH (ref 8–23)
CO2: 32 mmol/L (ref 22–32)
Calcium: 8.8 mg/dL — ABNORMAL LOW (ref 8.9–10.3)
Chloride: 96 mmol/L — ABNORMAL LOW (ref 98–111)
Creatinine, Ser: 2.24 mg/dL — ABNORMAL HIGH (ref 0.61–1.24)
GFR, Estimated: 29 mL/min — ABNORMAL LOW (ref >60)
Glucose, Bld: 166 mg/dL — ABNORMAL HIGH (ref 70–99)
Potassium: 4.5 mmol/L (ref 3.5–5.1)
Sodium: 138 mmol/L (ref 135–145)

## 2024-06-09 LAB — MAGNESIUM: Magnesium: 1.9 mg/dL (ref 1.7–2.4)

## 2024-06-09 LAB — I-STAT CHEM 8, ED
BUN: 90 mg/dL — ABNORMAL HIGH (ref 8–23)
Calcium, Ion: 1 mmol/L — ABNORMAL LOW (ref 1.15–1.40)
Chloride: 101 mmol/L (ref 98–111)
Creatinine, Ser: 2.4 mg/dL — ABNORMAL HIGH (ref 0.61–1.24)
Glucose, Bld: 142 mg/dL — ABNORMAL HIGH (ref 70–99)
HCT: 38 % — ABNORMAL LOW (ref 39.0–52.0)
Hemoglobin: 12.9 g/dL — ABNORMAL LOW (ref 13.0–17.0)
Potassium: 6.8 mmol/L (ref 3.5–5.1)
Sodium: 134 mmol/L — ABNORMAL LOW (ref 135–145)
TCO2: 30 mmol/L (ref 22–32)

## 2024-06-09 LAB — COMPREHENSIVE METABOLIC PANEL WITH GFR
ALT: 62 U/L — ABNORMAL HIGH (ref 0–44)
AST: 51 U/L — ABNORMAL HIGH (ref 15–41)
Albumin: 2.4 g/dL — ABNORMAL LOW (ref 3.5–5.0)
Alkaline Phosphatase: 112 U/L (ref 38–126)
Anion gap: 11 (ref 5–15)
BUN: 62 mg/dL — ABNORMAL HIGH (ref 8–23)
CO2: 29 mmol/L (ref 22–32)
Calcium: 8.8 mg/dL — ABNORMAL LOW (ref 8.9–10.3)
Chloride: 97 mmol/L — ABNORMAL LOW (ref 98–111)
Creatinine, Ser: 2.28 mg/dL — ABNORMAL HIGH (ref 0.61–1.24)
GFR, Estimated: 29 mL/min — ABNORMAL LOW (ref >60)
Glucose, Bld: 137 mg/dL — ABNORMAL HIGH (ref 70–99)
Potassium: 5.6 mmol/L — ABNORMAL HIGH (ref 3.5–5.1)
Sodium: 137 mmol/L (ref 135–145)
Total Bilirubin: 0.6 mg/dL (ref 0.0–1.2)
Total Protein: 7.2 g/dL (ref 6.5–8.1)

## 2024-06-09 LAB — GLUCOSE, CAPILLARY
Glucose-Capillary: 112 mg/dL — ABNORMAL HIGH (ref 70–99)
Glucose-Capillary: 221 mg/dL — ABNORMAL HIGH (ref 70–99)
Glucose-Capillary: 243 mg/dL — ABNORMAL HIGH (ref 70–99)

## 2024-06-09 LAB — BLOOD GAS, ARTERIAL
Acid-Base Excess: 6.3 mmol/L — ABNORMAL HIGH (ref 0.0–2.0)
Bicarbonate: 35.6 mmol/L — ABNORMAL HIGH (ref 20.0–28.0)
O2 Saturation: 51.4 %
Patient temperature: 37
pCO2 arterial: 74 mmHg (ref 32–48)
pH, Arterial: 7.29 — ABNORMAL LOW (ref 7.35–7.45)
pO2, Arterial: 32 mmHg — CL (ref 83–108)

## 2024-06-09 LAB — LACTIC ACID, PLASMA: Lactic Acid, Venous: 3.2 mmol/L (ref 0.5–1.9)

## 2024-06-09 LAB — BRAIN NATRIURETIC PEPTIDE: B Natriuretic Peptide: 59.8 pg/mL (ref 0.0–100.0)

## 2024-06-09 LAB — TROPONIN I (HIGH SENSITIVITY)
Troponin I (High Sensitivity): 14 ng/L (ref <18)
Troponin I (High Sensitivity): 14 ng/L (ref <18)

## 2024-06-09 MED ORDER — ARFORMOTEROL TARTRATE 15 MCG/2ML IN NEBU: 15.0000 ug | INHALATION_SOLUTION | Freq: Two times a day (BID) | RESPIRATORY_TRACT | Status: DC

## 2024-06-09 MED ORDER — ALBUTEROL SULFATE (2.5 MG/3ML) 0.083% IN NEBU
2.5000 mg | INHALATION_SOLUTION | RESPIRATORY_TRACT | Status: DC | PRN
Start: 2024-06-09 — End: 2024-06-15

## 2024-06-09 MED ORDER — ONDANSETRON HCL 4 MG/2ML IJ SOLN: 4.0000 mg | Freq: Four times a day (QID) | INTRAMUSCULAR | Status: DC | PRN

## 2024-06-09 MED ORDER — ACETAMINOPHEN 10 MG/ML IV SOLN: 1000.0000 mg | Freq: Four times a day (QID) | INTRAVENOUS | Status: DC

## 2024-06-09 MED ORDER — SODIUM CHLORIDE 0.9% FLUSH
3.0000 mL | Freq: Two times a day (BID) | INTRAVENOUS | Status: DC
  Administered 2024-06-09 – 2024-06-15 (×12): 3 mL via INTRAVENOUS

## 2024-06-09 MED ORDER — DOXAZOSIN MESYLATE 4 MG PO TABS: 4.0000 mg | ORAL_TABLET | Freq: Every day | ORAL | Status: DC

## 2024-06-09 MED ORDER — PANTOPRAZOLE SODIUM 40 MG PO TBEC: 40.0000 mg | DELAYED_RELEASE_TABLET | Freq: Every day | ORAL | Status: AC

## 2024-06-09 MED ORDER — ACETAMINOPHEN 10 MG/ML IV SOLN: 1000.0000 mg | Freq: Four times a day (QID) | INTRAVENOUS | Status: AC | PRN

## 2024-06-09 MED ORDER — ENSURE PLUS HIGH PROTEIN PO LIQD: 237.0000 mL | Freq: Three times a day (TID) | ORAL | Status: DC

## 2024-06-09 MED ORDER — INSULIN ASPART 100 UNIT/ML IJ SOLN
0.0000 [IU] | INTRAMUSCULAR | Status: DC
  Administered 2024-06-09: 2 [IU] via SUBCUTANEOUS
  Administered 2024-06-10: 1 [IU] via SUBCUTANEOUS
  Administered 2024-06-10 (×2): 2 [IU] via SUBCUTANEOUS
  Administered 2024-06-11: 1 [IU] via SUBCUTANEOUS

## 2024-06-09 MED ORDER — ACETAMINOPHEN 325 MG PO TABS: 650.0000 mg | ORAL_TABLET | Freq: Four times a day (QID) | ORAL | Status: DC | PRN

## 2024-06-09 MED ORDER — SODIUM CHLORIDE 0.9 % IV BOLUS
1000.0000 mL | Freq: Once | INTRAVENOUS | Status: AC
  Administered 2024-06-09: 1000 mL via INTRAVENOUS

## 2024-06-09 MED ORDER — METOPROLOL TARTRATE 25 MG PO TABS: 25.0000 mg | ORAL_TABLET | Freq: Two times a day (BID) | ORAL | Status: DC

## 2024-06-09 MED ORDER — DOCUSATE SODIUM 100 MG PO CAPS: 100.0000 mg | ORAL_CAPSULE | Freq: Every day | ORAL | Status: AC

## 2024-06-09 MED ORDER — BUDESONIDE 0.5 MG/2ML IN SUSP: 0.5000 mg | Freq: Two times a day (BID) | RESPIRATORY_TRACT | Status: DC

## 2024-06-09 MED ORDER — DOCUSATE SODIUM 50 MG/5ML PO LIQD: 100.0000 mg | Freq: Two times a day (BID) | ORAL | Status: DC | PRN

## 2024-06-09 MED ORDER — BUDESONIDE 0.5 MG/2ML IN SUSP
0.5000 mg | Freq: Two times a day (BID) | RESPIRATORY_TRACT | Status: DC
  Administered 2024-06-09 – 2024-06-12 (×6): 0.5 mg via RESPIRATORY_TRACT
  Filled 2024-06-09 (×6): qty 2

## 2024-06-09 MED ORDER — ALBUTEROL SULFATE (2.5 MG/3ML) 0.083% IN NEBU
2.5000 mg | INHALATION_SOLUTION | RESPIRATORY_TRACT | Status: DC | PRN
  Administered 2024-06-14: 2.5 mg via RESPIRATORY_TRACT
  Filled 2024-06-09: qty 3

## 2024-06-09 MED ORDER — QUETIAPINE FUMARATE 25 MG PO TABS: 25.0000 mg | ORAL_TABLET | Freq: Every day | ORAL | Status: AC

## 2024-06-09 MED ORDER — HALOPERIDOL LACTATE 5 MG/ML IJ SOLN: 1.0000 mg | Freq: Four times a day (QID) | INTRAMUSCULAR | Status: DC | PRN

## 2024-06-09 MED ORDER — IPRATROPIUM-ALBUTEROL 0.5-2.5 (3) MG/3ML IN SOLN
3.0000 mL | Freq: Four times a day (QID) | RESPIRATORY_TRACT | Status: DC
  Administered 2024-06-09 – 2024-06-12 (×11): 3 mL via RESPIRATORY_TRACT
  Filled 2024-06-09 (×11): qty 3

## 2024-06-09 MED ORDER — INSULIN ASPART 100 UNIT/ML IJ SOLN: 0.0000 [IU] | Freq: Three times a day (TID) | INTRAMUSCULAR | Status: AC

## 2024-06-09 MED ORDER — VANCOMYCIN VARIABLE DOSE PER UNSTABLE RENAL FUNCTION (PHARMACIST DOSING): Status: DC

## 2024-06-09 MED ORDER — ADULT MULTIVITAMIN W/MINERALS CH: 1.0000 | ORAL_TABLET | Freq: Every day | ORAL | Status: DC

## 2024-06-09 MED ORDER — HEPARIN SODIUM (PORCINE) 5000 UNIT/ML IJ SOLN
5000.0000 [IU] | Freq: Three times a day (TID) | INTRAMUSCULAR | Status: DC
  Administered 2024-06-09 – 2024-06-15 (×18): 5000 [IU] via SUBCUTANEOUS
  Filled 2024-06-09 (×18): qty 1

## 2024-06-09 MED ORDER — VANCOMYCIN HCL 1250 MG/250ML IV SOLN
1250.0000 mg | Freq: Once | INTRAVENOUS | Status: AC
  Administered 2024-06-09: 1250 mg via INTRAVENOUS
  Filled 2024-06-09: qty 250

## 2024-06-09 MED ORDER — SODIUM ZIRCONIUM CYCLOSILICATE 10 G PO PACK
10.0000 g | PACK | Freq: Once | ORAL | Status: AC
  Administered 2024-06-09: 10 g via ORAL
  Filled 2024-06-09: qty 1

## 2024-06-09 MED ORDER — REVEFENACIN 175 MCG/3ML IN SOLN: 175.0000 ug | Freq: Every day | RESPIRATORY_TRACT | Status: DC

## 2024-06-09 MED ORDER — SODIUM CHLORIDE 0.9 % IV SOLN
2.0000 g | INTRAVENOUS | Status: AC
  Administered 2024-06-09 – 2024-06-15 (×7): 2 g via INTRAVENOUS
  Filled 2024-06-09 (×7): qty 12.5

## 2024-06-09 NOTE — Progress Notes (Signed)
 Pharmacy Antibiotic Note  Clarence Maldonado is a 78 y.o. male admitted on 06/09/2024 with sepsis.  Pharmacy has been consulted for vancomycin  dosing. Patient's sCr trending up from baseline, will enter one time dose of vancomycin  and access labs tomorrow for need to redose.   Plan: Vancomycin  1250mg  IV x1 Monitor renal function and redose   Height: 5' 5 (165.1 cm) Weight: 57.4 kg (126 lb 8.7 oz) IBW/kg (Calculated) : 61.5  Temp (24hrs), Avg:98.1 F (36.7 C), Min:97.6 F (36.4 C), Max:98.7 F (37.1 C)  Recent Labs  Lab 06/06/24 0533 06/07/24 0528 06/08/24 0501 06/09/24 0527 06/09/24 1753 06/09/24 1808 06/09/24 1809  WBC 5.6 4.9 5.2 5.3 5.9  --   --   CREATININE 2.06* 2.12* 2.27* 2.24* 2.28* 2.40*  --   LATICACIDVEN  --   --   --   --   --   --  2.4*    Estimated Creatinine Clearance: 20.9 mL/min (A) (by C-G formula based on SCr of 2.4 mg/dL (H)).    No Known Allergies   Thank you for allowing pharmacy to be a part of this patient's care.  Koren CROME Sydney Hasten 06/09/2024 9:03 PM

## 2024-06-09 NOTE — TOC Transition Note (Signed)
 Transition of Care [facility]) - Discharge Note   Patient Details  Name: Clarence Maldonado MRN: [id number] Date of Birth: 01/27/1946  Transition of Care [facility]) CM/SW Contact:  Inocente GORMAN Kindle, LCSW Phone Number: 06/09/2024, 11:39 AM   Clinical Narrative:    Patient will DC to: [facility] Anticipated DC date: 06/09/24 Family notified: Daughter and Norleen Transport by: [place]   Per MD patient ready for DC to [facility]. RN to call report prior to discharge [phone] room 235a). RN, patient, patient's family, and facility notified of DC. Discharge Summary and FL2 sent to facility. DC packet on chart. Ambulance transport requested for patient.   CSW will sign off for now as social work intervention is no longer needed. Please consult us  again if new needs arise.     Final next level of care: Skilled Nursing Facility Barriers to Discharge: Barriers Resolved   Patient Goals and CMS Choice Patient states their goals for this hospitalization and ongoing recovery are:: Return to snf CMS Medicare.gov Compare Post Acute Care list provided to:: Patient Represenative (must comment) Choice offered to / list presented to : Adult Children [place] ownership interest in [facility].provided to:: Adult Children    Discharge Placement   Existing PASRR number confirmed : 06/09/24          Patient chooses bed at: [facility] Patient to be transferred to facility by: [facility] Name of family member notified: daughter and friend Patient and family notified of of transfer: 06/09/24  Discharge Plan and Services Additional resources added to the After Visit Summary for   In-house Referral: Clinical Social Work   Post Acute Care Choice: Skilled Nursing Facility                               Social Drivers of Health (SDOH) Interventions SDOH Screenings   Food Insecurity: No Food Insecurity (10/20/2023)  Housing: Unknown (10/20/2023)  Transportation Needs: No Transportation  Needs (10/20/2023)  Utilities: Not At Risk (10/20/2023)  Depression (PHQ2-9): Low Risk  (10/14/2023)  Financial Resource Strain: Low Risk  (03/23/2019)  Physical Activity: Inactive (03/23/2019)  Social Connections: Socially Isolated (03/23/2019)  Stress: No Stress Concern Present (03/23/2019)  Tobacco Use: Medium Risk (05/19/2024)     Readmission Risk Interventions    06/08/2024    3:02 PM  Readmission Risk Prevention Plan  Transportation Screening Complete  Medication Review (RN Care Manager) Complete  PCP or Specialist appointment within 3-5 days of discharge Complete  HRI or Home Care Consult Complete  SW Recovery Care/Counseling Consult Complete  Palliative Care Screening Not Applicable  Skilled Nursing Facility Complete

## 2024-06-09 NOTE — TOC Progression Note (Addendum)
 Transition of Care [facility]) - Progression Note    Patient Details  Name: Clarence Maldonado MRN: [id number] Date of Birth: January 05, 1946  Transition of Care [facility]) CM/SW Contact  Clarence GORMAN Kindle, LCSW Phone Number: 06/09/2024, 9:54 AM  Clinical Narrative:    [facility] ready for patient today. CSW left another voicemail for patient's daughter. CSW spoke with patient's friend, Clarence Maldonado, and updated him.   CSW received return call from patient's daughter and provided update. She asked about what happens if she is not able to afford patient's funeral costs when that time comes. CSW explained that social services can assist with cremation. CSW also explained process of obtaining Guardianship.    Expected Discharge Plan: Long Term Nursing Home (From [facility]) Barriers to Discharge: Continued Medical Work up               Expected Discharge Plan and Services In-house Referral: Clinical Social Work   Post Acute Care Choice: Skilled Nursing Facility Living arrangements for the past 2 months: Skilled Nursing Facility                                       Social Drivers of Health (SDOH) Interventions SDOH Screenings   Food Insecurity: No Food Insecurity (10/20/2023)  Housing: Unknown (10/20/2023)  Transportation Needs: No Transportation Needs (10/20/2023)  Utilities: Not At Risk (10/20/2023)  Depression (PHQ2-9): Low Risk  (10/14/2023)  Financial Resource Strain: Low Risk  (03/23/2019)  Physical Activity: Inactive (03/23/2019)  Social Connections: Socially Isolated (03/23/2019)  Stress: No Stress Concern Present (03/23/2019)  Tobacco Use: Medium Risk (05/19/2024)    Readmission Risk Interventions    06/08/2024    3:02 PM  Readmission Risk Prevention Plan  Transportation Screening Complete  Medication Review (RN Care Manager) Complete  PCP or Specialist appointment within 3-5 days of discharge Complete  HRI or Home Care Consult Complete  SW Recovery Care/Counseling Consult Complete   Palliative Care Screening Not Applicable  Skilled Nursing Facility Complete

## 2024-06-09 NOTE — Plan of Care (Signed)
 Pt has rested quietly throughout most of the night with no distress noted. Alert and oriented to self only tonight. On O2 4LNC. SR/ST  on the monitor. Purewick intact to suction. Pt also incontinent during night and cleaned/ linens changed. Pt pulling off leads during the night. Redirectable. No complaints voiced.     Problem: Respiratory: Goal: Ability to maintain a clear airway and adequate ventilation will improve Outcome: Progressing   Problem: Clinical Measurements: Goal: Respiratory complications will improve Outcome: Progressing Goal: Cardiovascular complication will be avoided Outcome: Progressing   Problem: Coping: Goal: Level of anxiety will decrease Outcome: Progressing   Problem: Pain Managment: Goal: General experience of comfort will improve and/or be controlled Outcome: Progressing

## 2024-06-09 NOTE — ED Provider Notes (Signed)
 [place] EMERGENCY DEPARTMENT AT [facility] Provider Note HPI Clarence Maldonado is a 78 y.o. male with a PMH of HTN, HLD, COPD, CKD 3b, DMT2, BPH, R AKA, anxiety/depression as below who presents from his facility via EMS for hypoxia.  EMS got a call out for respiratory distress.  They found him saturating in the 60s on 4 L of oxygen  at his facility.  He was placed on CPAP, given 2 DuoNeb treatments and 125 mg of Solu-Medrol  prior to arrival.  Patient states that he was discharged from the hospital earlier today.  He was doing well until he experienced the sudden onset of shortness of breath and difficulty catching his breath at his facility.  Denies chest pain.  Past Medical History:  Diagnosis Date   Abnormal CT of the chest 2009   nodule 9 x 6 mm RUL   Benign localized hyperplasia of prostate with urinary obstruction and other lower urinary tract symptoms (LUTS)(600.21)    Cardiomegaly    Cerebrovascular accident (stroke) (HCC) 2009   lacunar infarct in the left thalamus   Cholelithiasis    Chronic airway obstruction, not elsewhere classified    Chronic kidney disease, stage III (moderate) (HCC)    Closed femur fracture (HCC) 1960   Contact with or exposure to viral disease    suspected    Illiterate    Obesity, unspecified    Other and unspecified hyperlipidemia    Special screening examination for respiratory tuberculosis    Tobacco use disorder    Tuberculosis    Type II or unspecified type diabetes mellitus with renal manifestations, uncontrolled(250.42)    Unspecified essential hypertension    Past Surgical History:  Procedure Laterality Date   ABDOMINAL AORTOGRAM W/LOWER EXTREMITY N/A 10/25/2023   Procedure: ABDOMINAL AORTOGRAM W/LOWER EXTREMITY;  Surgeon: Pearline Norman RAMAN, MD;  Location: [facility] INVASIVE CV LAB;  Service: Cardiovascular;  Laterality: N/A;   AMPUTATION Right 10/28/2023   Procedure: AMPUTATION ABOVE KNEE;  Surgeon: Serene Gaile ORN, MD;  Location: [facility] OR;   Service: Vascular;  Laterality: Right;   CYSTOSCOPY  05/19/2024   Procedure: PHYLLIS SIDE;  Surgeon: Selma Donnice SAUNDERS, MD;  Location: [facility] OR;  Service: Urology;;   FEMUR FRACTURE SURGERY Left 1960   INCISION AND DRAINAGE ABSCESS N/A 05/19/2024   Procedure: INCISION AND DRAINAGE, PERINEAL ABSCESS;  Surgeon: Selma Donnice SAUNDERS, MD;  Location: [facility] OR;  Service: Urology;  Laterality: N/A;  Incision and Drainage of Scrotal abscess    Review of Systems Pertinent positives and negative findings are listed as part of the History of Present Illness and MDM  Physical Exam Vitals:   06/09/24 1751 06/09/24 1900 06/09/24 1920 06/09/24 1922  BP:  (!) 105/57    Pulse: (!) 102 89 95 90  Resp: 20 17 18 15   Temp:    98.4 F (36.9 C)  SpO2: 97% 91%    Weight:      Height:         Constitutional Nursing notes reviewed Vital signs reviewed  HEENT No obvious trauma Pupils cross midline Neck supple  Respiratory Tachypnea and increased respiratory effort Rhonchorous breath sounds predominantly on the right  CV Normal rate and rhythm   Abdomen Soft, non-tender, non-distended No peritonitis  MSK R AKA Extremities warm and well perfused  Neuro Awake and alert Pupils cross midline Moving all extremities    MDM:  Initial Differential Diagnoses includes aspiration, mucous plugging, pulmonary embolism, pneumonia, COPD exacerbation, ACS, pneumothorax, pleural effusion  I reviewed  the patient's vitals, the nursing triage note and evaluated the patient at bedside.   I reviewed the patient's external records which shows that the patient was discharged from the hospital earlier today after an admission for acute respiratory failure requiring intubation.  He arrives on CPAP after he was found hypoxic to the 60s at his facility.  On arrival he has tachypnea and increased respiratory effort.  He has rhonchorous breath sounds on the right with no evidence of prolonged expiratory phase or wheezing.  He did  receive DuoNebs and Solu-Medrol  with EMS so he may have been having a COPD exacerbation that is not present on arrival.  He was able to be transitioned to nasal cannula in the emergency department.    Initial labs show a pH of 7.37 with a PaO2 of 65 on ABG, concerning for hypoxemia.  Patient's bicarb is 31 representing a compensatory metabolic alkalosis.  Potassium elevated to 5.6 without acute EKG changes.  Patient was given Lokelma .  He has no white count.  Chest x-ray reviewed by myself shows multifocal bilateral patchy opacities similar to priors.  This is likely a sequelae of pneumonia and have low suspicion for new onset process given the patient was discharged today, has no fever and has no white count.  Considered ACS.  EKG is nonischemic.  There is no STEMI, high-grade conduction block or new onset arrhythmia.  Initial troponin of 14.  Patient has no chest pain.  I very low suspicion for ACS.  I considered PE, but the patient has CKD with a creatinine of 2.4.  He is hemodynamically stable.  I will defer imaging and workup to the inpatient team who may elect for a VQ scan.  Patient was admitted  Procedures: Procedures  Medications administered in the ED: Medications  sodium zirconium cyclosilicate  (LOKELMA ) packet 10 g (has no administration in time range)     Impression: 1. Hypoxemia   2. Respiratory distress      Patient's presentation is most consistent with acute presentation with potential threat to life or bodily function.  Disposition: ED Disposition:  Admit     ED Discharge Orders     None             Dionisio Blunt, MD 06/09/24 2027    Randol Simmonds, MD 06/10/24 1046

## 2024-06-09 NOTE — Progress Notes (Addendum)
   06/09/24 0919  Mobility  Activity Pivoted/transferred from bed to chair  Level of Assistance Maximum assist, patient does 25-49% (+2)  Assistive Device Other (Comment) (HHA)  Activity Response Tolerated fair  Mobility Referral Yes  Mobility visit 1 Mobility  Mobility Specialist Start Time (ACUTE ONLY) 0919  Mobility Specialist Stop Time (ACUTE ONLY) 0942  Mobility Specialist Time Calculation (min) (ACUTE ONLY) 23 min   Mobility Specialist: Progress Note -Visits  Pre-Mobility:      HR 104, SpO2 99% RA Post-Mobility:    HR 108,  Pt agreeable to mobility session - received in bed. Pt with consistent cough. Returned to chair with all needs met - call bell within reach. Chair alarm on.   __________________________________________________________________________  Pt requesting transfer- Agreeable to mobility session - received in bed. Pt with consistent cough and needing to have a BM, refused transfer to [facility]. Returned to bed with all needs ment. RN present.   Virgle Boards, BS Mobility Specialist Please contact via SecureChat or  Rehab office at [phone].

## 2024-06-09 NOTE — ED Triage Notes (Signed)
 Patient BIB EMS from [facility] c/o respiratory distress. Patient was having accessory muscle usage upon fire arrival so patient was initially put on [place] after sats were read at 68% on RA. Patient was then started on CPAP after the [place] was not working. Patient was just discharged yesterday for respiratory distress. Patient SOB. Patient given 125 solumedrol, 2 duonebs, and 10mg  of albuterol . Patient has diminished lung sounds in all fields with some crackles and rales in the lower lobes.

## 2024-06-09 NOTE — Discharge Summary (Signed)
 Physician Discharge Summary  Clarence Maldonado FMW:[id number] DOB: 1946-03-03 DOA: 05/12/2024  PCP: Gil Greig BRAVO, NP  Admit date: 05/12/2024 Discharge date: 06/09/2024  Admitted From: ( SNF) Disposition:  (SNF)  Recommendations for Outpatient Follow-up:  Please keep encouraging to use incentive spirometry and flutter valve Please continue with wound care, please see recommendation below Need follow-up on incidental finding of pulmonary nodule on CT chest, please repeat scan in 2 to 3 months to ensure resolution. Will need to ensure urology follow-up in 2 to 3 weeks for assessment.     [facility] [address] second floor [place], [place]  [address] [phone]       Diet recommendation: Dysphagia 3 with thin liquid  Brief/Interim Summary:   78 year old man who presented to [facility] 8/8 for acute respiratory failure. PMHx significant for HTN, HLD, COPD, CKD 3b, DMT2, BPH, R AKA, anxiety/depression.   Patient resides at rehab facility. Having SOB on 8/8 and prescribed rocephin /azithro. Worsening SOB. EMS called found in severe resp distress unresponsive. LMA placed and being bagged en route to [facility] ED. Wheezing b/l. Patient intubated on arrival, he was kept in ICU, his workup was suggestive of metapneumovirus pneumonia, sepsis, he was further diagnosed with perineal abscess, drained by urology.  He was seen by PCCM, urology, ID.  He was stabilized and transferred started by [facility] in day 16 of hospital stay, extremely frail, deconditioned, with NG tube feed, gradually improved, tolerating oral intake as he passed swallow evaluation, requiring aggressive chest PT given tenuous respiratory status, this has much improved at this point.          Significant Hospital Events:    8/8 - Admit w/ COPD, HMPV infection, intubated. 8/13 - Weaning on vent, PSV 10/5, mild tachypnea. Remains on Precedex . 8/15 Developed perineal abscess.  Underwent cystoscopy and I&D with  purulent drainage, wound packed 8/16 Failing PSV, on low dose levoped 8/19: failed SBT, off vasopressors. Treated for hyperkalemia 8/20: tol PS wean  05/25/2024 extubating 05/28/2024.  Transferred out of ICU 06/08/2024 Foley catheter discontinued, with no evidence of urinary retention     Septic shock upon admission due to combination of metapneumovirus and Pseudomonas pneumonia, perineal abscess and with Candida glabrata fungemia - he is initially admitted to ICU and intubated, seen by urology, ID.  He underwent incision and drainage by urology on 05/19/2024.  Currently he has finished his antibiotics & micafungin , urology assisting with wound care for his perineal abscess.  Wound much improved, continue with wound care, see instructions regarding wound care below, please ensure outpatient follow-up with urology, information has been provided by urology team. -Foley catheter was discontinued, no evidence of retention on discharge.   Metapneumovirus pneumonia, weak cough reflex, at risk for aspiration, dysphagia.   - Initially on tube feeds along with free water  flushes, cough reflex quite poor, have requested speech therapy to evaluate,  remains at risk for aspiration pneumonia he was monitored closely, - Keep encouraging to use incentive spirometry, flutter valve, get out of bed.  Tenuous respiratory status, at this has much improved.  Acute metabolic encephalopathy  Hospital delirium - Overall much improved, he is currently on low-dose Seroquel  at bedtime, does not require any as needed meds .   Acute respiratory failure with hypoxia and hypercapnia on admission -Due to sepsis on presentation -This has resolved, he is currently on room air, intermittently on 1 to 2 L for comfort, but no hypoxia, no further requirements of BiPAP for few days.  Severe dehydration with AKI - CKD 3B and hypernatremia.   - Resolved   Lung nodule noted on chest x-ray.   -CT chest ordered on 06/06/2024.  Will  definitely require pulmonary follow-up postdischarge within a month.     Hyperkalemia.  Treated.     Dysphagia due to above.  Protein calorie malnutrition, severe - Followed by SLP, off tube feeds, NG discontinued, dysphagia 3 with thin liquids, oral intake is improving.   Chronic diastolic CHF.  EF around 70% this admission on 05/14/2024.  Currently compensated.  Continue beta-blocker, continue with home Lasix     BPH  - Foley catheter discontinued yesterday, no evidence of retention, he is currently on Cardura  so Flomax  discontinued.     HTN   -BID metop, and Cardura    DM2  -oral intake is erratic, for now difficult to put on long acting regimen, so we will continue with insulin  sliding scale.          Discharge Diagnoses:  Principal Problem:   Acute respiratory failure with hypercapnia (HCC) Active Problems:   Protein-calorie malnutrition, severe    Discharge Instructions  Discharge Instructions     Diet - low sodium heart healthy   Complete by: As directed    Discharge instructions   Complete by: As directed    Follow with Primary MD Gil Greig BRAVO, NP /SNF physician  Get CBC, CMP,  checked  by Primary MD next visit.    Activity: As tolerated with Full fall precautions use walker/cane & assistance as needed   Disposition SNF   Diet: Heart Healthy  For Heart failure patients - Check your Weight same time everyday, if you gain over 2 pounds, or you develop in leg swelling, experience more shortness of breath or chest pain, call your Primary MD immediately. Follow Cardiac Low Salt Diet and 2 lit/day fluid restriction.   On your next visit with your primary care physician please Get Medicines reviewed and adjusted.   Please request your Prim.MD to go over all Hospital Tests and Procedure/Radiological results at the follow up, please get all Hospital records sent to your Prim MD by signing hospital release before you go home.   If you experience worsening of  your admission symptoms, develop shortness of breath, life threatening emergency, suicidal or homicidal thoughts you must seek medical attention immediately by calling 911 or calling your MD immediately  if symptoms less severe.  You Must read complete instructions/literature along with all the possible adverse reactions/side effects for all the Medicines you take and that have been prescribed to you. Take any new Medicines after you have completely understood and accpet all the possible adverse reactions/side effects.   Do not drive, operating heavy machinery, perform activities at heights, swimming or participation in water  activities or provide baby sitting services if your were admitted for syncope or siezures until you have seen by Primary MD or a Neurologist and advised to do so again.  Do not drive when taking Pain medications.    Do not take more than prescribed Pain, Sleep and Anxiety Medications  Special Instructions: If you have smoked or chewed Tobacco  in the last 2 yrs please stop smoking, stop any regular Alcohol  and or any Recreational drug use.  Wear Seat belts while driving.   Please note  You were cared for by a hospitalist during your hospital stay. If you have any questions about your discharge medications or the care you received while you were in the hospital after  you are discharged, you can call the unit and asked to speak with the hospitalist on call if the hospitalist that took care of you is not available. Once you are discharged, your primary care physician will handle any further medical issues. Please note that NO REFILLS for any discharge medications will be authorized once you are discharged, as it is imperative that you return to your primary care physician (or establish a relationship with a primary care physician if you do not have one) for your aftercare needs so that they can reassess your need for medications and monitor your lab values.   Discharge wound  care:   Complete by: As directed    change perineal dressing daily with saline moistened kerlix.   Increase activity slowly   Complete by: As directed       Allergies as of 06/09/2024   No Known Allergies      Medication List     STOP taking these medications    azithromycin 500 MG tablet Commonly known as: ZITHROMAX   buPROPion  150 MG 24 hr tablet Commonly known as: WELLBUTRIN  XL   cefTRIAXone  1 g injection Commonly known as: ROCEPHIN    fluticasone -salmeterol 250-50 MCG/ACT Aepb Commonly known as: ADVAIR   Jardiance 10 MG Tabs tablet Generic drug: empagliflozin   metoprolol  succinate 25 MG 24 hr tablet Commonly known as: TOPROL -XL   predniSONE  20 MG tablet Commonly known as: DELTASONE    Spiriva  Respimat 1.25 MCG/ACT Aers Generic drug: Tiotropium Bromide Monohydrate        TAKE these medications    acetaminophen  325 MG tablet Commonly known as: TYLENOL  Take 2 tablets (650 mg total) by mouth every 6 (six) hours as needed for mild pain (pain score 1-3) or fever (temp > 101.5).   albuterol  (2.5 MG/3ML) 0.083% nebulizer solution Commonly known as: PROVENTIL  Take 3 mLs (2.5 mg total) by nebulization every 4 (four) hours as needed for wheezing or shortness of breath.   arformoterol  15 MCG/2ML Nebu Commonly known as: BROVANA  Take 2 mLs (15 mcg total) by nebulization 2 (two) times daily.   atorvastatin  40 MG tablet Commonly known as: LIPITOR Take 40 mg by mouth at bedtime.   budesonide  0.5 MG/2ML nebulizer solution Commonly known as: PULMICORT  Take 2 mLs (0.5 mg total) by nebulization 2 (two) times daily.   docusate 50 MG/5ML liquid Commonly known as: COLACE Take 10 mLs (100 mg total) by mouth 2 (two) times daily as needed for mild constipation.   docusate sodium  100 MG capsule Commonly known as: COLACE Take 1 capsule (100 mg total) by mouth daily.   doxazosin  4 MG tablet Commonly known as: CARDURA  Take 1 tablet (4 mg total) by mouth daily. Start  taking on: June 10, 2024   feeding supplement Liqd Take 237 mLs by mouth 3 (three) times daily between meals.   ferrous sulfate  325 (65 FE) MG tablet Commonly known as: FeroSul Take 1 tablet (325 mg total) by mouth 2 (two) times daily with a meal.   furosemide  20 MG tablet Commonly known as: LASIX  Take 1 tablet (20 mg total) by mouth 3 (three) times a week.   insulin  aspart 100 UNIT/ML injection Commonly known as: novoLOG  Inject 0-9 Units into the skin 3 (three) times daily with meals. insulin  aspart (novoLOG ) injection 0-9 Units  0-9 Units, Subcutaneous, 3 times daily with meals, First dose on Mon 05/29/24 at 1200 Correction coverage: Sensitive (thin, NPO, renal) CBG < 70: Implement Hypoglycemia Standing Orders and refer to Hypoglycemia Standing Orders sidebar  report CBG 70 - 120: 0 units CBG 121 - 150: 1 unit CBG 151 - 200: 2 units CBG 201 - 250: 3 units CBG 251 - 300: 5 units CBG 301 - 350: 7 units CBG 351 - 400: 9 units CBG > 400: call MD and obtain STAT lab verification   ipratropium-albuterol  0.5-2.5 (3) MG/3ML Soln Commonly known as: DUONEB Take 3 mLs by nebulization every 4 (four) hours as needed (Wheezing/SOB).   metoprolol  tartrate 25 MG tablet Commonly known as: LOPRESSOR  Take 1 tablet (25 mg total) by mouth 2 (two) times daily.   multivitamin with minerals Tabs tablet Take 1 tablet by mouth daily. Start taking on: June 10, 2024   pantoprazole  40 MG tablet Commonly known as: PROTONIX  Take 1 tablet (40 mg total) by mouth daily. Start taking on: June 10, 2024   QUEtiapine  25 MG tablet Commonly known as: SEROQUEL  Take 1 tablet (25 mg total) by mouth at bedtime.   revefenacin  175 MCG/3ML nebulizer solution Commonly known as: YUPELRI  Take 3 mLs (175 mcg total) by nebulization daily. Start taking on: June 10, 2024   tamsulosin  0.4 MG Caps capsule Commonly known as: FLOMAX  Take 1 capsule (0.4 mg total) by mouth daily. Take with food                Discharge Care Instructions  (From admission, onward)           Start     Ordered   06/09/24 0000  Discharge wound care:       Comments: change perineal dressing daily with saline moistened kerlix.   06/09/24 1023            Contact information for after-discharge care     Destination     [facility] .   Service: Skilled Nursing Contact information: [address] [place]  [address] [phone]                    No Known Allergies  Consultations: PCCM, ID, urology    Procedures/Studies: CT CHEST WO CONTRAST Result Date: 06/06/2024 CLINICAL DATA:  Lung nodule. EXAM: CT CHEST WITHOUT CONTRAST TECHNIQUE: Multidetector CT imaging of the chest was performed following the standard protocol without IV contrast. RADIATION DOSE REDUCTION: This exam was performed according to the departmental dose-optimization program which includes automated exposure control, adjustment of the mA and/or kV according to patient size and/or use of iterative reconstruction technique. COMPARISON:  Radiographs September 04, 2024. CT scan of October 08, 2022. FINDINGS: Cardiovascular: Atherosclerosis of thoracic aorta without aneurysm formation. Normal cardiac size. No pericardial effusion. Coronary artery calcifications are noted. Mediastinum/Nodes: Small sliding-type hiatal hernia. No adenopathy is noted. Thyroid  gland is unremarkable. Lungs/Pleura: No pneumothorax or pleural effusion is noted. New moderate size opacity is noted posteriorly in right lower lobe concerning for pneumonia. 22 x 11 mm spiculated density is noted superiorly in this area which may simply represent inflammation, but short-term follow-up CT scan in 2-3 weeks is recommended to ensure resolution and rule out underlying malignancy. Emphysematous disease is noted bilaterally. Stable bilateral upper lobe scarring is noted. 11 x 4 mm calcified nodule noted anteriorly in right upper lobe which was  present on prior exam. Upper Abdomen: Cholelithiasis. Musculoskeletal: No chest wall mass or suspicious bone lesions identified. IMPRESSION: 1. New moderate size opacity is noted posteriorly in right lower lobe concerning for pneumonia. 22 x 11 mm spiculated density is noted superiorly in this area which may simply represent inflammation, but short-term follow-up  CT scan in 2-3 weeks is recommended to ensure resolution and rule out underlying malignancy. 2. Coronary artery calcifications are noted. 3. Small sliding-type hiatal hernia. 4. Cholelithiasis. Aortic Atherosclerosis (ICD10-I70.0) and Emphysema (ICD10-J43.9). Electronically Signed   By: Lynwood Landy Raddle M.D.   On: 06/06/2024 14:23   DG Chest Port 1 View Result Date: 06/05/2024 CLINICAL DATA:  Shortness of breath. EXAM: PORTABLE CHEST 1 VIEW COMPARISON:  06/03/2024 FINDINGS: Lungs are hyperexpanded as before. Areas of architectural distortion and interstitial opacity, left greater than right, are similar to prior likely scarring. 18 mm nodular opacity in the right lower lung is better demonstrated on the current study. No substantial pleural effusion. The cardiopericardial silhouette is within normal limits for size. No acute bony abnormality. Telemetry leads overlie the chest. IMPRESSION: 1. 18 mm nodular opacity in the right lower lung is better demonstrated on the current study. CT chest without contrast recommended to further evaluate. 2. Otherwise stable exam with chronic interstitial changes and architectural distortion in the lungs bilaterally. Electronically Signed   By: Camellia Candle M.D.   On: 06/05/2024 07:34   DG Chest Port 1 View Result Date: 06/03/2024 EXAM: 1 VIEW XRAY OF THE CHEST 06/03/2024 07:22:00 AM COMPARISON: 05/30/2024 CLINICAL HISTORY: SOB (shortness of breath) FINDINGS: LUNGS AND PLEURA: Stable patchy reticular opacities in left upper lung. Emphysema. No pulmonary edema. No pleural effusion. No pneumothorax. HEART AND  MEDIASTINUM: No acute abnormality of the cardiac and mediastinal silhouettes. Aortic atherosclerosis. BONES AND SOFT TISSUES: No acute osseous abnormality. IMPRESSION: 1. Unchanged left upper lobe predominant opacities likely sequala of inflammatory or infectious process. No new findings from prior exam. 2. Emphysema. Electronically signed by: Waddell Calk MD 06/03/2024 07:27 AM EDT RP Workstation: [id number]   DG Chest Port 1 View Result Date: 05/30/2024 EXAM: 1 VIEW XRAY OF THE CHEST 05/30/2024 08:03:00 AM COMPARISON: 05/28/2024 CLINICAL HISTORY: SOB (shortness of breath) FINDINGS: LUNGS AND PLEURA: Emphysematous changes. Chronic interstitial changes. Stable patchy opacities in left upper lung. No pleural effusion. No pneumothorax. HEART AND MEDIASTINUM: Atherosclerotic calcifications of aortic arch. No acute abnormality of the cardiac and mediastinal silhouettes. BONES AND SOFT TISSUES: No acute osseous abnormality. IMPRESSION: 1. Stable patchy opacities in the left upper lung. 2. Emphysematous changes and chronic interstitial changes. Electronically signed by: Waddell Calk MD 05/30/2024 08:34 AM EDT RP Workstation: [id number]   DG Chest Port 1 View Result Date: 05/28/2024 EXAM: 1 VIEW XRAY OF THE CHEST 05/28/2024 07:01:00 AM COMPARISON: 05/27/2024 CLINICAL HISTORY: 141880 SOB (shortness of breath) 141880. Reason for exam: pt order states SOB (shortness of breath) SOB (shortness of breath) FINDINGS: LUNGS AND PLEURA: Chronic parenchymal disease and left upper lung opacities. No acute findings. HEART AND MEDIASTINUM: No acute abnormality of the cardiac and mediastinal silhouettes. Aortic atherosclerosis (ICD10-I70.0). BONES AND SOFT TISSUES: No acute osseous abnormality. Feeding tube extends at least as far as stomach, type not seen. IMPRESSION: 1. No acute findings. 2. Chronic parenchymal disease and left upper lung opacities. Electronically signed by: Katheleen Faes MD 05/28/2024 07:28 AM EDT RP  Workstation: [id number]   DG Chest Port 1 View Result Date: 05/27/2024 CLINICAL DATA:  Respiratory failure. EXAM: PORTABLE CHEST 1 VIEW COMPARISON:  05/26/2024 FINDINGS: Lungs are hyperexpanded. Chronic interstitial changes again noted with areas of architectural distortion in the upper lungs bilaterally and left base. No dense focal consolidative disease. No substantial pleural effusion. The cardiopericardial silhouette is within normal limits for size. A feeding tube passes into the stomach although the distal tip position is not  included on the film. Telemetry leads overlie the chest. IMPRESSION: Hyperexpansion with chronic interstitial changes and areas of architectural distortion in the upper lungs bilaterally and left base. No substantial interval change. Electronically Signed   By: Camellia Candle M.D.   On: 05/27/2024 06:20   DG Chest Port 1 View Result Date: 05/26/2024 CLINICAL DATA:  78 year old male with respiratory failure. EXAM: PORTABLE CHEST 1 VIEW COMPARISON:  Portable chest 05/21/2024 and earlier. FINDINGS: Portable AP semi upright view at 0426 hours. Enteric feeding tube courses to the abdomen, tip not included. Large lung volumes. Stable cardiac size and mediastinal contours. Calcified aortic atherosclerosis. Asymmetric coarse bilateral pulmonary interstitial opacity, emphysema and chronic interstitial lung disease demonstrated by CT in 2024. No pneumothorax, pleural effusion or confluent lung opacity. Stable ventilation since 05/17/2024. Stable visualized osseous structures. IMPRESSION: Chronic lung disease appears stable. No acute cardiopulmonary abnormality. Emphysema (ICD10-J43.9). Electronically Signed   By: VEAR Hurst M.D.   On: 05/26/2024 08:49   DG CHEST PORT 1 VIEW Result Date: 05/21/2024 CLINICAL DATA:  Acute respiratory failure. EXAM: PORTABLE CHEST 1 VIEW COMPARISON:  05/17/2024 FINDINGS: Significant patient rotation. Endotracheal tube tip is below the clavicles. Weighted enteric  tube tip below the diaphragm not included in the field of view. The lungs are hyperinflated with bronchial thickening. Ill-defined left upper lobe opacity with improvement from prior exam. There is chronic basilar scarring. No new airspace disease. The heart is normal in size, mediastinal contours difficult to assess due to rotation. No pneumothorax. IMPRESSION: 1. Rotated exam.  Grossly stable support apparatus. 2. Ill-defined left upper lobe opacity with improvement from prior exam. 3. Hyperinflation with bronchial thickening. Electronically Signed   By: Andrea Gasman M.D.   On: 05/21/2024 15:57   CT PELVIS W CONTRAST Result Date: 05/18/2024 CLINICAL DATA:  Concern for Fournier's gangrene. EXAM: CT PELVIS WITH CONTRAST TECHNIQUE: Multidetector CT imaging of the pelvis was performed using the standard protocol following the bolus administration of intravenous contrast. RADIATION DOSE REDUCTION: This exam was performed according to the departmental dose-optimization program which includes automated exposure control, adjustment of the mA and/or kV according to patient size and/or use of iterative reconstruction technique. CONTRAST:  75mL OMNIPAQUE  IOHEXOL  350 MG/ML SOLN COMPARISON:  None Available. FINDINGS: Urinary Tract: Complex right renal cyst containing a thin septation is partially imaged measuring up to 7 cm. There is mild posterior left bladder wall thickening. No bladder calculi are seen. Bowel: Rectal tube in place. There is sigmoid colon diverticulosis. The appendix is normal. No dilated bowel loops are visualized. Vascular/Lymphatic: There is occlusion of the visualized right superficial femoral artery. Otherwise, pelvic arterial structures appear patent. There is severe atherosclerotic disease present. No enlarged lymph nodes are seen. Reproductive: There is a 9.4 x 3.5 by 6.8 cm fluid collection in the inferior right perineum extending to the level of the testicles. This abuts the base of the  penis and is inferior to the pelvic floor. There is diffuse scrotal wall edema. Other: There is a small fat containing right inguinal hernia. There is edema and swelling in the right inguinal region. Musculoskeletal: Degenerative changes affect the spine. IMPRESSION: 1. 9.4 x 3.5 x 6.8 cm fluid collection in the inferior right perineum extending to the level of the testicles. This is worrisome for abscess. 2. Diffuse scrotal wall edema. 3. Mild posterior left bladder wall thickening. Correlate clinically for cystitis. 4. Occlusion of the visualized right superficial femoral artery. 5. Complex right renal cyst measuring up to 7 cm. Recommend further  evaluation with renal ultrasound. 6. Small fat containing right inguinal hernia. Aortic Atherosclerosis (ICD10-I70.0). Electronically Signed   By: Greig Pique M.D.   On: 05/18/2024 23:28   DG Chest Port 1 View Result Date: 05/17/2024 CLINICAL DATA:  Ventilator dependence. EXAM: PORTABLE CHEST 1 VIEW COMPARISON:  05/15/2024 FINDINGS: Endotracheal tube tip is approximately 5 cm above the base of the carina. A feeding tube passes into the stomach although the distal tip position is not included on the film. Lungs are hyperexpanded. Diffuse chronic interstitial opacity is associated with patchy parenchymal opacity in the left upper lung, similar to prior. Scarring or trace effusion noted at the left base. Telemetry leads overlie the chest. IMPRESSION: 1. Endotracheal tube tip is approximately 5 cm above the base of the carina. 2. Chronic interstitial opacity with patchy parenchymal opacity in the left upper lung, similar to prior. Pneumonia in the left upper lung is not excluded. Electronically Signed   By: Camellia Candle M.D.   On: 05/17/2024 09:00   DG Abd Portable 1V Result Date: 05/15/2024 CLINICAL DATA:  Feeding tube placement EXAM: PORTABLE ABDOMEN - 1 VIEW COMPARISON:  05/12/2024 FINDINGS: Feeding tube extends into the stomach. The tip is in the duodenum.  Nonobstructive bowel gas pattern. IMPRESSION: Feeding tube with tip in the duodenum. Electronically Signed   By: Jackquline Boxer M.D.   On: 05/15/2024 11:48   DG Chest Port 1 View Result Date: 05/15/2024 EXAM: 1 VIEW XRAY OF THE CHEST 05/15/2024 05:19:57 AM COMPARISON: AP radiograph of the chest dated 05/12/2024. CLINICAL HISTORY: 33498 Respiratory failure (HCC) [id number]. Respiratory failure; Evaluate; Pleural effusions; COPD/chronic bronchitis FINDINGS: LUNGS AND PLEURA: The lungs are hyperexpanded and there are coarse interstitial opacities again demonstrated within the lungs bilaterally, similar to the prior exam. No focal pulmonary opacity. No pulmonary edema. No pleural effusion. No pneumothorax. HEART AND MEDIASTINUM: No acute abnormality of the cardiac and mediastinal silhouettes. There is moderate calcification within the aortic arch. BONES AND SOFT TISSUES: No acute osseous abnormality. An endotracheal tube and gastric tube remain in place. IMPRESSION: 1. Hyperexpanded lungs with coarse interstitial opacities bilaterally, similar to the prior exam. 2. Endotracheal tube and gastric tube in place. 3. Moderate calcification within the aortic arch. Electronically signed by: evalene coho 05/15/2024 09:10 AM EDT RP Workstation: [id number]   ECHOCARDIOGRAM COMPLETE Result Date: 05/14/2024    ECHOCARDIOGRAM REPORT   Patient Name:   COLEY KULIKOWSKI Date of Exam: 05/14/2024 Medical Rec #:  [id number]        Height:       65.0 in Accession #:    [id number]       Weight:       133.4 lb Date of Birth:  1946/08/26        BSA:          1.665 m Patient Age:    78 years         BP:           106/57 mmHg Patient Gender: M                HR:           104 bpm. Exam Location:  Inpatient Procedure: 2D Echo (Both Spectral and Color Flow Doppler were utilized during            procedure). Indications:    shock  History:        Patient has prior history of Echocardiogram examinations, most  recent 01/27/2020.  COPD and chronic kidney disease; Risk                 Factors:Hypertension and Dyslipidemia.  Sonographer:    Tinnie Barefoot RDCS Referring Phys: [id number] NORLEEN BIRCH PAYNE  Sonographer Comments: Echo performed with patient supine and on artificial respirator. Image acquisition challenging due to respiratory motion and Image acquisition challenging due to COPD. IMPRESSIONS  1. Left ventricular ejection fraction, by estimation, is 70 to 75%. The left ventricle has hyperdynamic function. The left ventricle has no regional wall motion abnormalities. There is mild left ventricular hypertrophy. Left ventricular diastolic parameters are consistent with Grade I diastolic dysfunction (impaired relaxation).  2. Right ventricular systolic function is normal. The right ventricular size is mildly enlarged. Tricuspid regurgitation signal is inadequate for assessing PA pressure.  3. The mitral valve is normal in structure. No evidence of mitral valve regurgitation. No evidence of mitral stenosis.  4. The aortic valve is tricuspid. There is mild calcification of the aortic valve. There is mild thickening of the aortic valve. Aortic valve regurgitation is mild. No aortic stenosis is present.  5. Aortic dilatation noted. There is mild dilatation of the ascending aorta, measuring 41 mm.  6. The inferior vena cava is normal in size with <50% respiratory variability, suggesting right atrial pressure of 8 mmHg. FINDINGS  Left Ventricle: Left ventricular ejection fraction, by estimation, is 70 to 75%. The left ventricle has hyperdynamic function. The left ventricle has no regional wall motion abnormalities. The left ventricular internal cavity size was normal in size. There is mild left ventricular hypertrophy. Left ventricular diastolic parameters are consistent with Grade I diastolic dysfunction (impaired relaxation). Right Ventricle: The right ventricular size is mildly enlarged. Right vetricular wall thickness was not well visualized.  Right ventricular systolic function is normal. Tricuspid regurgitation signal is inadequate for assessing PA pressure. Left Atrium: Left atrial size was normal in size. Right Atrium: Right atrial size was normal in size. Pericardium: There is no evidence of pericardial effusion. Mitral Valve: The mitral valve is normal in structure. There is mild thickening of the mitral valve leaflet(s). There is mild calcification of the mitral valve leaflet(s). Mild mitral annular calcification. No evidence of mitral valve regurgitation. No evidence of mitral valve stenosis. MV peak gradient, 14.1 mmHg. The mean mitral valve gradient is 6.0 mmHg. Tricuspid Valve: The tricuspid valve is normal in structure. Tricuspid valve regurgitation is not demonstrated. No evidence of tricuspid stenosis. Aortic Valve: The aortic valve is tricuspid. There is mild calcification of the aortic valve. There is mild thickening of the aortic valve. There is mild aortic valve annular calcification. Aortic valve regurgitation is mild. Aortic regurgitation PHT measures 269 msec. No aortic stenosis is present. Aortic valve mean gradient measures 5.0 mmHg. Aortic valve peak gradient measures 10.4 mmHg. Aortic valve area, by VTI measures 3.45 cm. Pulmonic Valve: The pulmonic valve was not well visualized. Pulmonic valve regurgitation is not visualized. No evidence of pulmonic stenosis. Aorta: The aortic root is normal in size and structure and aortic dilatation noted. There is mild dilatation of the ascending aorta, measuring 41 mm. Venous: The inferior vena cava is normal in size with less than 50% respiratory variability, suggesting right atrial pressure of 8 mmHg. IAS/Shunts: No atrial level shunt detected by color flow Doppler.  LEFT VENTRICLE PLAX 2D LVIDd:         4.00 cm     Diastology LVIDs:         2.90 cm  LV e' medial:    8.05 cm/s LV PW:         1.10 cm     LV E/e' medial:  11.9 LV IVS:        1.10 cm     LV e' lateral:   6.96 cm/s LVOT  diam:     2.10 cm     LV E/e' lateral: 13.8 LV SV:         76 LV SV Index:   46 LVOT Area:     3.46 cm  LV Volumes (MOD) LV vol d, MOD A2C: 83.3 ml LV vol s, MOD A2C: 31.5 ml LV SV MOD A2C:     51.8 ml RIGHT VENTRICLE             IVC RV Basal diam:  2.70 cm     IVC diam: 2.00 cm RV S prime:     16.30 cm/s TAPSE (M-mode): 2.3 cm LEFT ATRIUM             Index        RIGHT ATRIUM           Index LA diam:        2.90 cm 1.74 cm/m   RA Area:     13.80 cm LA Vol (A2C):   48.1 ml 28.88 ml/m  RA Volume:   37.00 ml  22.22 ml/m LA Vol (A4C):   53.9 ml 32.37 ml/m LA Biplane Vol: 52.3 ml 31.41 ml/m  AORTIC VALVE AV Area (Vmax):    3.00 cm AV Area (Vmean):   2.68 cm AV Area (VTI):     3.45 cm AV Vmax:           161.53 cm/s AV Vmean:          104.501 cm/s AV VTI:            0.221 m AV Peak Grad:      10.4 mmHg AV Mean Grad:      5.0 mmHg LVOT Vmax:         140.00 cm/s LVOT Vmean:        80.800 cm/s LVOT VTI:          0.220 m LVOT/AV VTI ratio: 1.00 AI PHT:            269 msec  AORTA Ao Root diam: 3.90 cm Ao Asc diam:  4.10 cm MITRAL VALVE MV Area VTI:  2.64 cm      SHUNTS MV Peak grad: 14.1 mmHg     Systemic VTI:  0.22 m MV Mean grad: 6.0 mmHg      Systemic Diam: 2.10 cm MV Vmax:      1.88 m/s MV Vmean:     114.0 cm/s MV E velocity: 96.00 cm/s MV A velocity: 157.00 cm/s MV E/A ratio:  0.61 Dorn Ross MD Electronically signed by Dorn Ross MD Signature Date/Time: 05/14/2024/12:19:58 PM    Final    CT HEAD WO CONTRAST ( ) Result Date: 05/12/2024 CLINICAL DATA:  Altered mental status.  No history of recent trauma. EXAM: CT HEAD WITHOUT CONTRAST TECHNIQUE: Contiguous axial images were obtained from the base of the skull through the vertex without intravenous contrast. RADIATION DOSE REDUCTION: This exam was performed according to the departmental dose-optimization program which includes automated exposure control, adjustment of the mA and/or kV according to patient size and/or use of iterative reconstruction  technique. COMPARISON:  Head CT 11/19/2023 FINDINGS: Brain: There is moderately advanced cerebral atrophy and atrophic  ventriculomegaly with moderate to severe small vessel disease the cerebral white matter. There are chronic cortical based infarcts again noted in the left parietal and medial left occipital lobes, old lacunar infarct in the anterior left thalamus. There are bilateral small chronic gangliocapsular lacunae as well. Cerebellum and brainstem are unremarkable. No cortical based acute infarct, hemorrhage, mass or mass effect are seen. There is no midline shift.  Basal cisterns are clear. Vascular: The distal vertebral arteries are heavily calcified, with moderate to heavy calcific plaque both siphons. No hyperdense central vessel is seen. Skull: Negative for fractures or focal lesions. Sinuses/Orbits: There is partial opacification of the bilateral ethmoid air cells new in the interval, trace fluid in the sphenoid sinus. Other paranasal sinuses, bilateral mastoid air cells and middle ears are clear with pneumatization of both petrous apices. Negative orbits. Other: Partial visualization of orotracheal and oroenteric intubation. IMPRESSION: 1. No acute intracranial CT findings or interval changes. 2. Chronic changes.  Old infarcts. 3. Sinus disease. Electronically Signed   By: Francis Quam M.D.   On: 05/12/2024 20:08   DG Abd Portable 1 View Result Date: 05/12/2024 CLINICAL DATA:  post intubation/OG placement EXAM: DG ABD PORTABLE 1V COMPARISON:  May 12, 2024 chest radiograph FINDINGS: Gas distended stomach in the upper abdomen. Esophagogastric tube terminates in the stomach. The last side hole is in the distal esophagus. No pneumoperitoneum. No organomegaly or radiopaque calculi. Multilevel thoracic osteophytosis. IMPRESSION: Esophagogastric tube terminates in the stomach. The last side hole is in the distal esophagus. Continued advancement is recommended for optimal positioning. Electronically  Signed   By: Rogelia Myers M.D.   On: 05/12/2024 18:44   DG Chest Portable 1 View Result Date: 05/12/2024 CLINICAL DATA:  Respiratory distress EXAM: PORTABLE CHEST 1 VIEW COMPARISON:  11/19/2023 FINDINGS: Endotracheal tube terminates 3.5 cm above carina. Nasogastric terminates in the proximal stomach, better evaluated on dedicated abdominal radiograph. Apical lordotic positioning Midline trachea. Normal heart size. Atherosclerosis in the transverse aorta. Left costophrenic angle minimally excluded. No large pleural effusion and no pneumothorax. Left greater than right moderate to marked interstitial thickening and hyperinflation are similar. No superimposed lobar consolidation. Left lower lobe scarring laterally. IMPRESSION: Appropriate position of endotracheal tube. Nasogastric tube terminates at the proximal stomach and should be advanced for optimal positioning. Please see abdominal radiograph dictated separately. Left greater than right, moderate to marked interstitial thickening likely related to COPD/chronic bronchitis. No superimposed acute process. Aortic Atherosclerosis (ICD10-I70.0). Electronically Signed   By: Rockey Kilts M.D.   On: 05/12/2024 18:43      Subjective: No significant events overnight as discussed with staff, patient himself denies any complaints today, he is a poor historian at baseline, Foley catheter discontinued yesterday, no evidence of retention, good urine output, bladder scan showing 125 cc  Discharge Exam: Vitals:   06/08/24 2348 06/09/24 0400  BP: 121/66 136/62  Pulse: 71 70  Resp: 14 13  Temp: 97.8 F (36.6 C) 98 F (36.7 C)  SpO2: 96% 99%   Vitals:   06/08/24 1958 06/08/24 2348 06/09/24 0400 06/09/24 0500  BP:  121/66 136/62   Pulse:  71 70   Resp:  14 13   Temp: 97.9 F (36.6 C) 97.8 F (36.6 C) 98 F (36.7 C)   TempSrc: Oral Oral Oral   SpO2:  96% 99%   Weight:    57.4 kg  Height:         Awake, alert, extremely frail, deconditioned,  chronically ill-appearing, oriented x 1  Good air entry bilaterally, no wheezing  Regular rate and rhythm +ve B.Sounds, Abd Soft, No tenderness, suprapubic ecchymosis improving No Cyanosis,right AKA   The results of significant diagnostics from this hospitalization (including imaging, microbiology, ancillary and laboratory) are listed below for reference.     Microbiology: No results found for this or any previous visit (from the past 240 hours).   Labs: BNP (last 3 results) Recent Labs    06/05/24 0627 06/08/24 0501  BNP 91.3 53.4   Basic Metabolic Panel: Recent Labs  Lab 06/05/24 0627 06/06/24 0533 06/07/24 0528 06/08/24 0501 06/09/24 0527  NA 143 139 137 140 138  K 5.2* 4.2 4.5 5.0 4.5  CL 101 96* 99 97* 96*  CO2 30 30 30 29  32  GLUCOSE 99 121* 111* 110* 166*  BUN 41* 51* 64* 59* 57*  CREATININE 1.94* 2.06* 2.12* 2.27* 2.24*  CALCIUM  8.8* 8.7* 8.9 8.9 8.8*  MG 1.9 1.9 1.9 1.9 1.9   Liver Function Tests: No results for input(s): AST, ALT, ALKPHOS, BILITOT, PROT, ALBUMIN in the last 168 hours. No results for input(s): LIPASE, AMYLASE in the last 168 hours. No results for input(s): AMMONIA in the last 168 hours. CBC: Recent Labs  Lab 06/05/24 0627 06/06/24 0533 06/07/24 0528 06/08/24 0501 06/09/24 0527  WBC 5.4 5.6 4.9 5.2 5.3  NEUTROABS 3.6 3.9 3.3 3.3 3.5  HGB 10.3* 10.8* 10.8* 10.8* 10.1*  HCT 33.4* 34.5* 35.8* 35.5* 33.2*  MCV 88.6 87.6 89.1 89.0 88.1  PLT 174 180 168 176 139*   Cardiac Enzymes: No results for input(s): CKTOTAL, CKMB, CKMBINDEX, TROPONINI in the last 168 hours. BNP: Invalid input(s): POCBNP CBG: Recent Labs  Lab 06/08/24 1217 06/08/24 1556 06/08/24 2135 06/08/24 2139 06/09/24 0805  GLUCAP 256* 82 >600* 241* 112*   D-Dimer No results for input(s): DDIMER in the last 72 hours. Hgb A1c No results for input(s): HGBA1C in the last 72 hours. Lipid Profile No results for input(s): CHOL, HDL,  LDLCALC, TRIG, CHOLHDL, LDLDIRECT in the last 72 hours. Thyroid  function studies No results for input(s): TSH, T4TOTAL, T3FREE, THYROIDAB in the last 72 hours.  Invalid input(s): FREET3 Anemia work up No results for input(s): VITAMINB12, FOLATE, FERRITIN, TIBC, IRON , RETICCTPCT in the last 72 hours. Urinalysis    Component Value Date/Time   COLORURINE AMBER (A) 05/29/2024 0102   APPEARANCEUR CLEAR 05/29/2024 0102   LABSPEC 1.016 05/29/2024 0102   PHURINE 6.0 05/29/2024 0102   GLUCOSEU NEGATIVE 05/29/2024 0102   HGBUR SMALL (A) 05/29/2024 0102   BILIRUBINUR NEGATIVE 05/29/2024 0102   KETONESUR NEGATIVE 05/29/2024 0102   PROTEINUR NEGATIVE 05/29/2024 0102   NITRITE NEGATIVE 05/29/2024 0102   LEUKOCYTESUR NEGATIVE 05/29/2024 0102   Sepsis Labs Recent Labs  Lab 06/06/24 0533 06/07/24 0528 06/08/24 0501 06/09/24 0527  WBC 5.6 4.9 5.2 5.3   Microbiology No results found for this or any previous visit (from the past 240 hours).   Time coordinating discharge: Over 30 minutes  SIGNED:   Brayton Lye, MD  [facility] 06/09/2024, 10:23 AM Pager   If 7PM-7AM, please contact night-coverage [email or web address]

## 2024-06-09 NOTE — H&P (Addendum)
 History and Physical    EMORY GALLENTINE FMW:[id number] DOB: Nov 01, 1945 DOA: 06/09/2024  PCP: Gil Greig BRAVO, NP   Maldonado coming from: LTC - [facility]    Chief Complaint:  Chief Complaint  Maldonado presents with   Respiratory Distress    HPI:  Clarence SWALLOWS is a 78 y.o. male with hx of Dementia, CVA, dysphagia, CAD, hx silent Mis, HFimpEF, CKD3b, HTN, HLD, DM type 2, COPD, BPH, R AKA, anxiety / depression, prior smoking, prior pulmonary TB, recent complex hospitalization from 8/8 - 9/5 for respiratory failure requiring intubation in the setting of human metapneumovirus and pseudomonas pneumonia, septic shock in setting of candida glabrata fungemia and perineal abscess requiring vasopressor, and additional problems including AKI, hyperkalemia, urinary retention, and dysphagia; he was brought back in on same day of discharge due to acute hypoxic respiratory failure requiring NIV.   Per ED  EMS got a call out for respiratory distress. They found him saturating in the 60s on 4 L of oxygen  at his facility. He was placed on CPAP, given 2 DuoNeb treatments and 125 mg of Solu-Medrol  prior to arrival    On my interview, he is able to tell me he suddenly became more short of breath. Denies issues with dysphagia to his knowledge. Coughing up purulent sputum during encounter. Short of breath at rest. No chest pain. Feels dehydrated. Otherwise not able to provide significant history     Review of Systems:  ROS complete and negative except as marked above   No Known Allergies  Prior to Admission medications   Medication Sig Start Date End Date Taking? Authorizing Provider  acetaminophen  (TYLENOL ) 325 MG tablet Take 2 tablets (650 mg total) by mouth every 6 (six) hours as needed for mild pain (pain score 1-3) or fever (temp > 101.5). 06/09/24   Elgergawy, Brayton RAMAN, MD  albuterol  (PROVENTIL ) (2.5 MG/3ML) 0.083% nebulizer solution Take 3 mLs (2.5 mg total) by nebulization every 4 (four) hours  as needed for wheezing or shortness of breath. 06/09/24   Elgergawy, Brayton RAMAN, MD  arformoterol  (BROVANA ) 15 MCG/2ML NEBU Take 2 mLs (15 mcg total) by nebulization 2 (two) times daily. 06/09/24   Elgergawy, Brayton RAMAN, MD  atorvastatin  (LIPITOR) 40 MG tablet Take 40 mg by mouth at bedtime.    [provider]  budesonide  (PULMICORT ) 0.5 MG/2ML nebulizer solution Take 2 mLs (0.5 mg total) by nebulization 2 (two) times daily. 06/09/24   Elgergawy, Brayton RAMAN, MD  docusate (COLACE) 50 MG/5ML liquid Take 10 mLs (100 mg total) by mouth 2 (two) times daily as needed for mild constipation. 06/09/24   Elgergawy, Brayton RAMAN, MD  docusate sodium  (COLACE) 100 MG capsule Take 1 capsule (100 mg total) by mouth daily. 06/09/24   Elgergawy, Brayton RAMAN, MD  doxazosin  (CARDURA ) 4 MG tablet Take 1 tablet (4 mg total) by mouth daily. 06/10/24   Elgergawy, Brayton RAMAN, MD  feeding supplement (ENSURE PLUS HIGH PROTEIN) LIQD Take 237 mLs by mouth 3 (three) times daily between meals. 06/09/24   Elgergawy, Brayton RAMAN, MD  ferrous sulfate  (FEROSUL) 325 (65 FE) MG tablet Take 1 tablet (325 mg total) by mouth 2 (two) times daily with a meal. 04/06/23   Cleotilde Garnette CHRISTELLA, MD  furosemide  (LASIX ) 20 MG tablet Take 1 tablet (20 mg total) by mouth 3 (three) times a week. 09/24/23   Fargo, Amy E, NP  insulin  aspart (NOVOLOG ) 100 UNIT/ML injection Inject 0-9 Units into the skin 3 (three) times daily with  meals. insulin  aspart (novoLOG ) injection 0-9 Units  0-9 Units, Subcutaneous, 3 times daily with meals, First dose on Mon 05/29/24 at 1200 Correction coverage: Sensitive (thin, NPO, renal) CBG < 70: Implement Hypoglycemia Standing Orders and refer to Hypoglycemia Standing Orders sidebar report CBG 70 - 120: 0 units CBG 121 - 150: 1 unit CBG 151 - 200: 2 units CBG 201 - 250: 3 units CBG 251 - 300: 5 units CBG 301 - 350: 7 units CBG 351 - 400: 9 units CBG > 400: call MD and obtain STAT lab verification 06/09/24   Elgergawy, Brayton RAMAN, MD  ipratropium-albuterol   (DUONEB) 0.5-2.5 (3) MG/3ML SOLN Take 3 mLs by nebulization every 4 (four) hours as needed (Wheezing/SOB). 02/20/24   [provider]  metoprolol  tartrate (LOPRESSOR ) 25 MG tablet Take 1 tablet (25 mg total) by mouth 2 (two) times daily. 06/09/24   Elgergawy, Brayton RAMAN, MD  Multiple Vitamin (MULTIVITAMIN WITH MINERALS) TABS tablet Take 1 tablet by mouth daily. 06/10/24   Elgergawy, Brayton RAMAN, MD  pantoprazole  (PROTONIX ) 40 MG tablet Take 1 tablet (40 mg total) by mouth daily. 06/10/24   Elgergawy, Brayton RAMAN, MD  QUEtiapine  (SEROQUEL ) 25 MG tablet Take 1 tablet (25 mg total) by mouth at bedtime. 06/09/24   Elgergawy, Brayton RAMAN, MD  revefenacin  (YUPELRI ) 175 MCG/3ML nebulizer solution Take 3 mLs (175 mcg total) by nebulization daily. 06/10/24   Elgergawy, Brayton RAMAN, MD  tamsulosin  (FLOMAX ) 0.4 MG CAPS capsule Take 1 capsule (0.4 mg total) by mouth daily. Take with food 07/12/23   Gil Greig BRAVO, NP    Past Medical History:  Diagnosis Date   Abnormal CT of the chest 2009   nodule 9 x 6 mm RUL   Benign localized hyperplasia of prostate with urinary obstruction and other lower urinary tract symptoms (LUTS)(600.21)    Cardiomegaly    Cerebrovascular accident (stroke) (HCC) 2009   lacunar infarct in the left thalamus   Cholelithiasis    Chronic airway obstruction, not elsewhere classified    Chronic kidney disease, stage III (moderate) (HCC)    Closed femur fracture (HCC) 1960   Contact with or exposure to viral disease    suspected    Illiterate    Obesity, unspecified    Other and unspecified hyperlipidemia    Special screening examination for respiratory tuberculosis    Tobacco use disorder    Tuberculosis    Type II or unspecified type diabetes mellitus with renal manifestations, uncontrolled(250.42)    Unspecified essential hypertension     Past Surgical History:  Procedure Laterality Date   ABDOMINAL AORTOGRAM W/LOWER EXTREMITY N/A 10/25/2023   Procedure: ABDOMINAL AORTOGRAM W/LOWER  EXTREMITY;  Surgeon: Pearline Norman RAMAN, MD;  Location: [facility];  Service: Cardiovascular;  Laterality: N/A;   AMPUTATION Right 10/28/2023   Procedure: AMPUTATION ABOVE KNEE;  Surgeon: Serene Gaile ORN, MD;  Location: [facility] OR;  Service: Vascular;  Laterality: Right;   CYSTOSCOPY  05/19/2024   Procedure: PHYLLIS SIDE;  Surgeon: Selma Donnice SAUNDERS, MD;  Location: [facility] OR;  Service: Urology;;   FEMUR FRACTURE SURGERY Left 1960   INCISION AND DRAINAGE ABSCESS N/A 05/19/2024   Procedure: INCISION AND DRAINAGE, PERINEAL ABSCESS;  Surgeon: Selma Donnice SAUNDERS, MD;  Location: [facility] OR;  Service: Urology;  Laterality: N/A;  Incision and Drainage of Scrotal abscess     reports that he has quit smoking. His smoking use included cigarettes. He has a 30 pack-year smoking history. He has never used smokeless tobacco. He  reports that he does not currently use alcohol. He reports that he does not use drugs.  Family History  Problem Relation Age of Onset   Heart disease Mother      Physical Exam: Vitals:   06/09/24 1751 06/09/24 1900 06/09/24 1920 06/09/24 1922  BP:  (!) 105/57    Pulse: (!) 102 89 95 90  Resp: 20 17 18 15   Temp:    98.4 F (36.9 C)  SpO2: 97% 91%    Weight:      Height:        Gen: Awake, alert, elderly, frail   CV: Regular, normal S1, S2, no murmurs  Resp: mild increased WOB, tachypneic.  Diminished throughout the R base. Coarse otherwise.  Abd: Flat, normoactive, nontender MSK: R AKA, LLE no edema  Skin: No rashes or lesions to exposed skin  Neuro: Alert and interactive, oriented to person, [place]. Not to time 2003, somewhat to situation.  Psych: euthymic, appropriate    Data review:   Labs reviewed, notable for:   VBG 7.29 / 74  ABG 7.37 / 52 / 65  K 5.6  Cr 2.28  BUN 62  AST / ALT 51 / 62, other LFT wnl  HS trop 14  Lactate 2.4  WBC 5    Micro:  Results for orders placed or performed during the hospital encounter of 05/12/24  SARS Coronavirus 2 by RT  PCR (hospital order, performed in [facility] hospital lab) *cepheid single result test*     Status: Maldonado   Collection Time: 05/12/24  5:45 PM  Result Value Ref Range Status   SARS Coronavirus 2 by RT PCR NEGATIVE NEGATIVE Final    Comment: Performed at [facility], [address], [place], [place] [address]  Respiratory (~20 pathogens) panel by PCR     Status: Abnormal   Collection Time: 05/12/24  6:37 PM   Specimen: Nasopharyngeal Swab; Respiratory  Result Value Ref Range Status   Adenovirus NOT DETECTED NOT DETECTED Final   Coronavirus 229E NOT DETECTED NOT DETECTED Final    Comment: (Maldonado) The Coronavirus on the Respiratory Panel, DOES NOT test for the novel  Coronavirus (2019 nCoV)    Coronavirus HKU1 NOT DETECTED NOT DETECTED Final   Coronavirus NL63 NOT DETECTED NOT DETECTED Final   Coronavirus OC43 NOT DETECTED NOT DETECTED Final   Metapneumovirus DETECTED (A) NOT DETECTED Final   Rhinovirus / Enterovirus NOT DETECTED NOT DETECTED Final   Influenza A NOT DETECTED NOT DETECTED Final   Influenza B NOT DETECTED NOT DETECTED Final   Parainfluenza Virus 1 NOT DETECTED NOT DETECTED Final   Parainfluenza Virus 2 NOT DETECTED NOT DETECTED Final   Parainfluenza Virus 3 NOT DETECTED NOT DETECTED Final   Parainfluenza Virus 4 NOT DETECTED NOT DETECTED Final   Respiratory Syncytial Virus NOT DETECTED NOT DETECTED Final   Bordetella pertussis NOT DETECTED NOT DETECTED Final   Bordetella Parapertussis NOT DETECTED NOT DETECTED Final   Chlamydophila pneumoniae NOT DETECTED NOT DETECTED Final   Mycoplasma pneumoniae NOT DETECTED NOT DETECTED Final    Comment: Performed at [facility], [address] [place], [place] [address]  Culture, blood (Routine X 2) w Reflex to ID Panel     Status: Maldonado   Collection Time: 05/12/24  6:41 PM   Specimen: BLOOD  Result Value Ref Range Status   Specimen Description BLOOD RIGHT ANTECUBITAL  Final   Special Requests   Final    BOTTLES  DRAWN  AEROBIC AND ANAEROBIC Blood Culture results may not be optimal due to an inadequate volume of blood received in culture bottles   Culture   Final    NO GROWTH 5 DAYS Performed at [facility], [address] [place], [place] [address]    Report Status 05/17/2024 FINAL  Final  Culture, blood (Routine X 2) w Reflex to ID Panel     Status: Abnormal   Collection Time: 05/12/24  7:40 PM   Specimen: BLOOD LEFT HAND  Result Value Ref Range Status   Specimen Description BLOOD LEFT HAND  Final   Special Requests   Final    BOTTLES DRAWN AEROBIC AND ANAEROBIC Blood Culture results may not be optimal due to an inadequate volume of blood received in culture bottles   Culture  Setup Time   Final    GRAM POSITIVE COCCI IN CLUSTERS AEROBIC BOTTLE ONLY PHARMD JESICA MILLEN [id number] BY J RAZZAK, MT    Culture (A)  Final    STAPHYLOCOCCUS HOMINIS THE SIGNIFICANCE OF ISOLATING THIS ORGANISM FROM A SINGLE SET OF BLOOD CULTURES WHEN MULTIPLE SETS ARE DRAWN IS UNCERTAIN. PLEASE NOTIFY THE MICROBIOLOGY DEPARTMENT WITHIN ONE WEEK IF SPECIATION AND SENSITIVITIES ARE REQUIRED. Performed at [facility], [address] [place], [place] [address]    Report Status 05/15/2024 FINAL  Final  Blood Culture ID Panel (Reflexed)     Status: Abnormal   Collection Time: 05/12/24  7:40 PM  Result Value Ref Range Status   Enterococcus faecalis NOT DETECTED NOT DETECTED Final   Enterococcus Faecium NOT DETECTED NOT DETECTED Final   Listeria monocytogenes NOT DETECTED NOT DETECTED Final   Staphylococcus species DETECTED (A) NOT DETECTED Final    Comment: CRITICAL RESULT CALLED TO, READ BACK BY AND VERIFIED WITH: PHARMD JESSICA MILLEN [id number] BY J RAZZAK, MT    Staphylococcus aureus (BCID) NOT DETECTED NOT DETECTED Final   Staphylococcus epidermidis NOT DETECTED NOT DETECTED Final   Staphylococcus lugdunensis NOT DETECTED NOT DETECTED Final   Streptococcus species NOT DETECTED NOT DETECTED  Final   Streptococcus agalactiae NOT DETECTED NOT DETECTED Final   Streptococcus pneumoniae NOT DETECTED NOT DETECTED Final   Streptococcus pyogenes NOT DETECTED NOT DETECTED Final   A.calcoaceticus-baumannii NOT DETECTED NOT DETECTED Final   Bacteroides fragilis NOT DETECTED NOT DETECTED Final   Enterobacterales NOT DETECTED NOT DETECTED Final   Enterobacter cloacae complex NOT DETECTED NOT DETECTED Final   Escherichia coli NOT DETECTED NOT DETECTED Final   Klebsiella aerogenes NOT DETECTED NOT DETECTED Final   Klebsiella oxytoca NOT DETECTED NOT DETECTED Final   Klebsiella pneumoniae NOT DETECTED NOT DETECTED Final   Proteus species NOT DETECTED NOT DETECTED Final   Salmonella species NOT DETECTED NOT DETECTED Final   Serratia marcescens NOT DETECTED NOT DETECTED Final   Haemophilus influenzae NOT DETECTED NOT DETECTED Final   Neisseria meningitidis NOT DETECTED NOT DETECTED Final   Pseudomonas aeruginosa NOT DETECTED NOT DETECTED Final   Stenotrophomonas maltophilia NOT DETECTED NOT DETECTED Final   Candida albicans NOT DETECTED NOT DETECTED Final   Candida auris NOT DETECTED NOT DETECTED Final   Candida glabrata NOT DETECTED NOT DETECTED Final   Candida krusei NOT DETECTED NOT DETECTED Final   Candida parapsilosis NOT DETECTED NOT DETECTED Final   Candida tropicalis NOT DETECTED NOT DETECTED Final   Cryptococcus neoformans/gattii NOT DETECTED NOT DETECTED Final    Comment: Performed at [facility], [address], [place], [place] [address]  MRSA Next  Gen by PCR, Nasal     Status: Abnormal   Collection Time: 05/12/24  7:50 PM   Specimen: Nasal Mucosa; Nasal Swab  Result Value Ref Range Status   MRSA by PCR Next Gen DETECTED (A) NOT DETECTED Final    Comment: RESULT CALLED TO, READ BACK BY AND VERIFIED WITH: LOISE PIETY RN 05/12/2024 @ 2317 BY AB (Maldonado) The GeneXpert MRSA Assay (FDA approved for NASAL specimens only), is one component of a comprehensive MRSA colonization  surveillance program. It is not intended to diagnose MRSA infection nor to guide or monitor treatment for MRSA infections. Test performance is not FDA approved in patients less than 100 years old. Performed at [facility], [address], [place], [place] [address]   Culture, Respiratory w Gram Stain     Status: Maldonado   Collection Time: 05/15/24  8:03 AM   Specimen: Tracheal Aspirate; Respiratory  Result Value Ref Range Status   Specimen Description TRACHEAL ASPIRATE  Final   Special Requests Maldonado  Final   Gram Stain   Final    ABUNDANT WBC PRESENT, PREDOMINANTLY PMN FEW GRAM NEGATIVE RODS Performed at [facility], [address], [place], [place] [address]    Culture MODERATE PSEUDOMONAS AERUGINOSA  Final   Report Status 05/17/2024 FINAL  Final   Organism ID, Bacteria PSEUDOMONAS AERUGINOSA  Final      Susceptibility   Pseudomonas aeruginosa - MIC*    MEROPENEM <=0.25 SENSITIVE Sensitive     CIPROFLOXACIN 0.12 SENSITIVE Sensitive     IMIPENEM 2 SENSITIVE Sensitive     PIP/TAZO Value in next row Sensitive ug/mL     8 SENSITIVEThis is a modified FDA-approved test that has been validated and its performance characteristics determined by the reporting laboratory.  This laboratory is certified under the Clinical Laboratory Improvement Amendments CLIA as qualified to perform high complexity clinical laboratory testing.    CEFEPIME  Value in next row Sensitive      8 SENSITIVEThis is a modified FDA-approved test that has been validated and its performance characteristics determined by the reporting laboratory.  This laboratory is certified under the Clinical Laboratory Improvement Amendments CLIA as qualified to perform high complexity clinical laboratory testing.    CEFTAZIDIME/AVIBACTAM Value in next row Sensitive ug/mL     8 SENSITIVEThis is a modified FDA-approved test that has been validated and its performance characteristics determined by the reporting laboratory.  This  laboratory is certified under the Clinical Laboratory Improvement Amendments CLIA as qualified to perform high complexity clinical laboratory testing.    CEFTOLOZANE/TAZOBACTAM Value in next row Sensitive ug/mL     8 SENSITIVEThis is a modified FDA-approved test that has been validated and its performance characteristics determined by the reporting laboratory.  This laboratory is certified under the Clinical Laboratory Improvement Amendments CLIA as qualified to perform high complexity clinical laboratory testing.    TOBRAMYCIN Value in next row Sensitive      8 SENSITIVEThis is a modified FDA-approved test that has been validated and its performance characteristics determined by the reporting laboratory.  This laboratory is certified under the Clinical Laboratory Improvement Amendments CLIA as qualified to perform high complexity clinical laboratory testing.    CEFTAZIDIME Value in next row Sensitive      8 SENSITIVEThis is a modified FDA-approved test that has been validated and its performance characteristics determined by the reporting laboratory.  This laboratory is certified under the Clinical Laboratory Improvement Amendments CLIA as qualified to perform high complexity clinical  laboratory testing.    * MODERATE PSEUDOMONAS AERUGINOSA  Aerobic/Anaerobic Culture w Gram Stain (surgical/deep wound)     Status: Maldonado   Collection Time: 05/19/24  7:15 AM   Specimen: Scrotum; GU  Result Value Ref Range Status   Specimen Description SCROTUM  Final   Special Requests Maldonado  Final   Gram Stain NO WBC SEEN RARE BUDDING YEAST SEEN   Final   Culture FEW CANDIDA GLABRATA NO ANAEROBES ISOLATED   Final   Report Status 05/24/2024 FINAL  Final    Imaging reviewed:  DG Chest Portable 1 View Result Date: 06/09/2024 CLINICAL DATA:  Respiratory distress EXAM: PORTABLE CHEST 1 VIEW COMPARISON:  CT chest dated 06/06/2024, chest radiograph dated 06/05/2024 FINDINGS: Normal lung volumes. Similar multifocal  bilateral patchy opacities. No pleural effusion or pneumothorax. The heart size and mediastinal contours are within normal limits. No acute osseous abnormality. Old left clavicular fracture deformity. IMPRESSION: Similar multifocal bilateral patchy opacities, in keeping with known pneumonia and sequela of prior infection/inflammation. Electronically Signed   By: Limin  Xu M.D.   On: 06/09/2024 18:27    EKG:  Personally reviewed, SR with PAC, Suspect repolarization changes anteriorly. PWRP. Inferior q wave. There is lateral TWI.   ED Course:  With EMS placed on CPAP, given 2 DuoNeb treatments and 125 mg of Solu-Medrol  prior to arrival. Transitioned to [place] in the ED. Ordered for Lokelma     Assessment/Plan:  78 y.o. male with hx Dementia, CVA, dysphagia, CAD, hx silent Mis, HFimpEF, CKD3b, HTN, HLD, DM type 2, COPD, BPH, R AKA, anxiety / depression, prior smoking, prior pulmonary TB, recent complex hospitalization from 8/8 - 9/5 for respiratory failure requiring intubation in the setting of human metapneumovirus and pseudomonas pneumonia, septic shock in setting of candida glabrata fungemia and perineal abscess requiring vasopressor, and additional problems including AKI, hyperkalemia, urinary retention, and dysphagia; he was brought back in on same day of discharge due to acute hypoxic respiratory failure requiring NIV.   Acute hypoxic and hypercapneic respiratory failure  Suspect aspiration pneumonia  Underlying dysphagia  P/w sudden respiratory distress. On EMS arrival sat in 60's on 4L (D/c on RA). Most recent gas ABG 7.37 / 53 / 65 (on 5L). WBC 5. CXR has multifocal patchy opacities. Recent CT chest on 9/2 demonstrated new moderate RLL opacity concerning for pneumonia. Suspect ongoing aspiration with hx of dysphagia, poor cough per recent hospitalization.  -- Start on Vancomycin  pharmacy to dose, and Cefepime  2 g IV q 24 hr (renal dosed) (Hx MRSA nares + and Peudomonas on recent sputum) -- Check  Bcx prior to abx. And check sputum culture, hold off on repeat viral testing.  -- Duoneb q 6 hr, albuterol  prn, IS, flutter valve, chest PT, encourage OOB  -- NPO except sips until seen by SLP; previously was on Dysphagia 3 diet with thin liquids.   Lactic acidosis  Lactate 2.4. Likely in setting of infection, dehydration  -- Give 1 L of NS, start on mIVF at 100 cc/hr. Check lactate. Rebolus as needed.  -- Check BNP   Acute kidney injury stage I  Mild Hyperkalemia  Hx CKD 3b Baseline Cr ~ 1.5 - 1.7; had fluctuated during recent admission was 2.2 on discharge. Likely prerenal although had recent urinary retention requiring foley  -- Dose of Lokelma  in the ED, trend K can add additional Lokelma  if requires  -- IVF per above  -- Check PVR, renal US    Recent hospital treatment:  Human Metapneumovirus, HCAP, Pseudomonas  pneumonia: Completed 10 day course of Linezolid  and Zosyn  E 8/23  Candida glabrata fungemia, perineal abscess: s/p I+D on 8/15, completed 8 days of Micafungin  E 8/24   Incidental findings:  Lung nodule: on last hospitalization, recently seen 2.2 x 1.1 cm spiculated density RLL; recommend repeat imaging as outpatient.  Aortic ectasia: On recent echo, ascending aorta 4.1 cm. Recommend surveillance OP.   Chronic medical problems: -> Holding meds due to dysphagia, resume when able  Dementia: Noted; delirum precautions, fall precautions. Haldol  prn for agitation if not redirectable. Resume Seroquel  when able to take PO.  CVA: Does not appear on antiPLT. Holding home atorvastatin , resume when able  CAD with silent MI: See CVA HFimpEF: Without acute exacerbation, check BNP. Echo 8/10 was LVEF 70-75%, G1DD, no significant valvular disease. Hold home lasix  with AKI.  HTN: Hold Doxazosin , Metoprolol  resume when able.  HLD: See CVA.  DM type 2: SSI q 4 for very sensitive; can change to ac hs when taking diet  COPD: Without acute exacerbation. Continue home Budesonide . Resume Brovana   equivalent when off scheduled duonebs. See mgmt in AHRF above.  BPH: Hold Doxazosin .  R AKA: Noted  GERD: Hold PPI  Anxiety / depression:  Prior smoker: Noted  Prior pulmonary TB: was treated in '21   Body mass index is 21.06 kg/m.    DVT prophylaxis:  SQ Heparin  Code Status:  Full Code Diet:  Diet Orders (From admission, onward)    Maldonado      Family Communication:  Attempted to call daughter [name] x 2,. Rang once then went to machine.   Consults:  Maldonado   Admission status:   Inpatient, Step Down Unit  Severity of Illness: The appropriate Maldonado status for this Maldonado is INPATIENT. Inpatient status is judged to be reasonable and necessary in order to provide the required intensity of service to ensure the Maldonado's safety. The Maldonado's presenting symptoms, physical exam findings, and initial radiographic and laboratory data in the context of their chronic comorbidities is felt to place them at high risk for further clinical deterioration. Furthermore, it is not anticipated that the Maldonado will be medically stable for discharge from the hospital within 2 midnights of admission.   * I certify that at the point of admission it is my clinical judgment that the Maldonado will require inpatient hospital care spanning beyond 2 midnights from the point of admission due to high intensity of service, high risk for further deterioration and high frequency of surveillance required.*   Dorn Dawson, MD [facility]  How to contact the [facility] Attending or Consulting provider 7A - 7P or covering provider during after hours 7P -7A, for this Maldonado.  Check the care team in [facility] and look for a) attending/consulting [facility] provider listed and b) the [facility] team listed Log into [email or web address] and use [facility] universal password to access. If you do not have the password, please contact the hospital operator. Locate the [facility] provider you are looking for under [facility] and page to a  number that you can be directly reached. If you still have difficulty reaching the provider, please page the [facility] (Director on Call) for the Hospitalists listed on amion for assistance.  06/09/2024, 8:18 PM

## 2024-06-10 ENCOUNTER — Inpatient Hospital Stay (HOSPITAL_COMMUNITY)

## 2024-06-10 DIAGNOSIS — J690 Pneumonitis due to inhalation of food and vomit: Secondary | ICD-10-CM | POA: Diagnosis not present

## 2024-06-10 DIAGNOSIS — I10 Essential (primary) hypertension: Secondary | ICD-10-CM

## 2024-06-10 DIAGNOSIS — E1169 Type 2 diabetes mellitus with other specified complication: Secondary | ICD-10-CM

## 2024-06-10 DIAGNOSIS — J9601 Acute respiratory failure with hypoxia: Secondary | ICD-10-CM | POA: Diagnosis not present

## 2024-06-10 LAB — GLUCOSE, CAPILLARY
Glucose-Capillary: 112 mg/dL — ABNORMAL HIGH (ref 70–99)
Glucose-Capillary: 127 mg/dL — ABNORMAL HIGH (ref 70–99)
Glucose-Capillary: 127 mg/dL — ABNORMAL HIGH (ref 70–99)
Glucose-Capillary: 151 mg/dL — ABNORMAL HIGH (ref 70–99)
Glucose-Capillary: 194 mg/dL — ABNORMAL HIGH (ref 70–99)
Glucose-Capillary: 213 mg/dL — ABNORMAL HIGH (ref 70–99)

## 2024-06-10 LAB — CBC
HCT: 32 % — ABNORMAL LOW (ref 39.0–52.0)
Hemoglobin: 9.5 g/dL — ABNORMAL LOW (ref 13.0–17.0)
MCH: 26.5 pg (ref 26.0–34.0)
MCHC: 29.7 g/dL — ABNORMAL LOW (ref 30.0–36.0)
MCV: 89.4 fL (ref 80.0–100.0)
Platelets: 130 K/uL — ABNORMAL LOW (ref 150–400)
RBC: 3.58 MIL/uL — ABNORMAL LOW (ref 4.22–5.81)
RDW: 17.4 % — ABNORMAL HIGH (ref 11.5–15.5)
WBC: 5.1 K/uL (ref 4.0–10.5)
nRBC: 0 % (ref 0.0–0.2)

## 2024-06-10 LAB — BASIC METABOLIC PANEL WITH GFR
Anion gap: 12 (ref 5–15)
BUN: 60 mg/dL — ABNORMAL HIGH (ref 8–23)
CO2: 26 mmol/L (ref 22–32)
Calcium: 8.3 mg/dL — ABNORMAL LOW (ref 8.9–10.3)
Chloride: 99 mmol/L (ref 98–111)
Creatinine, Ser: 2.14 mg/dL — ABNORMAL HIGH (ref 0.61–1.24)
GFR, Estimated: 31 mL/min — ABNORMAL LOW (ref >60)
Glucose, Bld: 207 mg/dL — ABNORMAL HIGH (ref 70–99)
Potassium: 5.2 mmol/L — ABNORMAL HIGH (ref 3.5–5.1)
Sodium: 137 mmol/L (ref 135–145)

## 2024-06-10 LAB — PHOSPHORUS: Phosphorus: 4 mg/dL (ref 2.5–4.6)

## 2024-06-10 LAB — TSH: TSH: 0.676 u[IU]/mL (ref 0.350–4.500)

## 2024-06-10 LAB — MAGNESIUM: Magnesium: 1.9 mg/dL (ref 1.7–2.4)

## 2024-06-10 LAB — LACTIC ACID, PLASMA: Lactic Acid, Venous: 1.9 mmol/L (ref 0.5–1.9)

## 2024-06-10 LAB — POTASSIUM: Potassium: 5.1 mmol/L (ref 3.5–5.1)

## 2024-06-10 MED ORDER — TAMSULOSIN HCL 0.4 MG PO CAPS
0.4000 mg | ORAL_CAPSULE | Freq: Every day | ORAL | Status: DC
  Administered 2024-06-11 – 2024-06-15 (×6): 0.4 mg via ORAL
  Filled 2024-06-10 (×6): qty 1

## 2024-06-10 MED ORDER — LINEZOLID 600 MG/300ML IV SOLN
600.0000 mg | Freq: Two times a day (BID) | INTRAVENOUS | Status: DC
  Administered 2024-06-11 (×3): 600 mg via INTRAVENOUS
  Filled 2024-06-10 (×4): qty 300

## 2024-06-10 MED ORDER — FERROUS SULFATE 325 (65 FE) MG PO TABS
325.0000 mg | ORAL_TABLET | Freq: Two times a day (BID) | ORAL | Status: DC
  Administered 2024-06-11 – 2024-06-15 (×9): 325 mg via ORAL
  Filled 2024-06-10 (×9): qty 1

## 2024-06-10 MED ORDER — SODIUM ZIRCONIUM CYCLOSILICATE 10 G PO PACK
10.0000 g | PACK | Freq: Every day | ORAL | Status: AC
  Administered 2024-06-10: 10 g via ORAL
  Filled 2024-06-10: qty 1

## 2024-06-10 MED ORDER — ATORVASTATIN CALCIUM 40 MG PO TABS
40.0000 mg | ORAL_TABLET | Freq: Every day | ORAL | Status: DC
  Administered 2024-06-11 – 2024-06-15 (×6): 40 mg via ORAL
  Filled 2024-06-10 (×6): qty 1

## 2024-06-10 NOTE — Progress Notes (Addendum)
 Pharmacy Antibiotic Note  Clarence Maldonado is a 78 y.o. male admitted on 06/09/2024 with sepsis.  Pharmacy has been consulted for vancomycin  dosing. Patient's SCr is trending down from yesterday.  A one-time dose of vancomycin  1250 mg was administered yesterday.   After discussion with the provider, will transition from vancomycin  to Linezolid  for now.  Plan: Discontinue vancomycin  Initiate linezolid  IV 600 mg Q12 Monitor clinical status and cultures as available Will narrow antibiotics as appropriate  Height: 5' 6 (167.6 cm) Weight: 58.6 kg (129 lb 3 oz) IBW/kg (Calculated) : 63.8  Temp (24hrs), Avg:98.3 F (36.8 C), Min:97.8 F (36.6 C), Max:98.8 F (37.1 C)  Recent Labs  Lab 06/07/24 0528 06/08/24 0501 06/09/24 0527 06/09/24 1753 06/09/24 1808 06/09/24 1809 06/09/24 2104 06/10/24 0439  WBC 4.9 5.2 5.3 5.9  --   --   --  5.1  CREATININE 2.12* 2.27* 2.24* 2.28* 2.40*  --   --  2.14*  LATICACIDVEN  --   --   --   --   --  2.4* 3.2* 1.9    Estimated Creatinine Clearance: 24 mL/min (A) (by C-G formula based on SCr of 2.14 mg/dL (H)).    No Known Allergies   Thank you for allowing pharmacy to be a part of this patient's care.  WENDI Amon Rocher, PharmD PGY-1 Pharmacy Resident [facility] 06/10/2024 10:02 AM

## 2024-06-10 NOTE — Plan of Care (Signed)

## 2024-06-10 NOTE — Evaluation (Signed)
 Clinical/Bedside Swallow Evaluation Patient Details  Name: Clarence Maldonado MRN: [id number] Date of Birth: 10/19/1945  Today's Date: 06/10/2024 Time: SLP Start Time (ACUTE ONLY): 1335 SLP Stop Time (ACUTE ONLY): 1354 SLP Time Calculation (min) (ACUTE ONLY): 19 min  Past Medical History:  Past Medical History:  Diagnosis Date   Abnormal CT of the chest 2009   nodule 9 x 6 mm RUL   Benign localized hyperplasia of prostate with urinary obstruction and other lower urinary tract symptoms (LUTS)(600.21)    Cardiomegaly    Cerebrovascular accident (stroke) (HCC) 2009   lacunar infarct in the left thalamus   Cholelithiasis    Chronic airway obstruction, not elsewhere classified    Chronic kidney disease, stage III (moderate) (HCC)    Closed femur fracture (HCC) 1960   Contact with or exposure to viral disease    suspected    Illiterate    Obesity, unspecified    Other and unspecified hyperlipidemia    Special screening examination for respiratory tuberculosis    Tobacco use disorder    Tuberculosis    Type II or unspecified type diabetes mellitus with renal manifestations, uncontrolled(250.42)    Unspecified essential hypertension    Past Surgical History:  Past Surgical History:  Procedure Laterality Date   ABDOMINAL AORTOGRAM W/LOWER EXTREMITY N/A 10/25/2023   Procedure: ABDOMINAL AORTOGRAM W/LOWER EXTREMITY;  Surgeon: Pearline Norman RAMAN, MD;  Location: [facility];  Service: Cardiovascular;  Laterality: N/A;   AMPUTATION Right 10/28/2023   Procedure: AMPUTATION ABOVE KNEE;  Surgeon: Serene Gaile ORN, MD;  Location: [facility] OR;  Service: Vascular;  Laterality: Right;   CYSTOSCOPY  05/19/2024   Procedure: PHYLLIS SIDE;  Surgeon: Selma Donnice SAUNDERS, MD;  Location: [facility] OR;  Service: Urology;;   FEMUR FRACTURE SURGERY Left 1960   INCISION AND DRAINAGE ABSCESS N/A 05/19/2024   Procedure: INCISION AND DRAINAGE, PERINEAL ABSCESS;  Surgeon: Selma Donnice SAUNDERS, MD;  Location: [facility] OR;  Service:  Urology;  Laterality: N/A;  Incision and Drainage of Scrotal abscess   HPI:  Patient is a 78 y.o. male with PMH: COPD, CDK 3b, DMT2, HTN, HLD, BPH, R AKA, anxiety/depression. He was admitted from 8/8-06/09/24 with worsening SOB. He presented back to the hospital from SNF on day of discharge (9/5) with respiratory distress; when EMS arrived they found him to be saturating in the 60%'s on 4L oxygen  at his facility. He was placed on CPAP, given two DuoNeb treatments and 125 mg of Solu-Medrol . DG chest portable reported Similar multifocal bilateral patchy opacities, in keeping with known pneumonia and sequela of prior infection/inflammation. SLP ordered to assess patient's swallow function.    Assessment / Plan / Recommendation  Clinical Impression  Patient did not present with suspected change in his swallow function during today's evaluation. He continues to be confused but he was more alert and interactive overall. He exhibited congested cough which was productive of clear phlegm which he spit into urinal container. He was able to feed himself lunch (dysphagia 3 solids, thin liquids) without needing assistance. No overt s/s aspiration observed but patient with ongoing congested sounding cough occuring with and without PO intake. SLP will follow briefly to ensure PO toleration and determine if MBS needed to r/o aspiration. SLP Visit Diagnosis: Dysphagia, unspecified (R13.10)    Aspiration Risk  Mild aspiration risk    Diet Recommendation Dysphagia 3 (Mech soft);Thin liquid    Liquid Administration via: Cup;Straw Medication Administration: Other (Comment) (as tolerated) Compensations: Slow rate;Small sips/bites Postural Changes: Seated  upright at 90 degrees    Other  Recommendations Oral Care Recommendations: Oral care BID     Assistance Recommended at Discharge    Functional Status Assessment Patient has had a recent decline in their functional status and demonstrates the ability to make  significant improvements in function in a reasonable and predictable amount of time.  Frequency and Duration min 1 x/week  1 week       Prognosis Prognosis for improved oropharyngeal function: Good Barriers to Reach Goals: Cognitive deficits      Swallow Study   General Date of Onset: 06/09/24 HPI: Patient is a 78 y.o. male with PMH: COPD, CDK 3b, DMT2, HTN, HLD, BPH, R AKA, anxiety/depression. He was admitted from 8/8-06/09/24 with worsening SOB. He presented back to the hospital from SNF on day of discharge (9/5) with respiratory distress; when EMS arrived they found him to be saturating in the 60%'s on 4L oxygen  at his facility. He was placed on CPAP, given two DuoNeb treatments and 125 mg of Solu-Medrol . DG chest portable reported Similar multifocal bilateral patchy opacities, in keeping with known pneumonia and sequela of prior infection/inflammation. SLP ordered to assess patient's swallow function. Type of Study: Bedside Swallow Evaluation Previous Swallow Assessment: during recent past admission: BSE on 05/28/24 Diet Prior to this Study: Dysphagia 3 (mechanical soft);Thin liquids (Level 0) Temperature Spikes Noted: No Respiratory Status: Nasal cannula Behavior/Cognition: Alert;Cooperative;Confused;Pleasant mood Oral Cavity Assessment: Within Functional Limits Oral Care Completed by SLP: No Oral Cavity - Dentition: Edentulous Vision: Functional for self-feeding Self-Feeding Abilities: Able to feed self Patient Positioning: Upright in bed Baseline Vocal Quality: Normal Volitional Cough: Congested Volitional Swallow: Able to elicit    Oral/Motor/Sensory Function Overall Oral Motor/Sensory Function: Within functional limits   Ice Chips     Thin Liquid Thin Liquid: Within functional limits Presentation: Straw;Self Fed    Nectar Thick     Honey Thick     Puree Puree: Not tested   Solid     Solid: Within functional limits Presentation: Self Fed      Norleen IVAR Blase, MA,  CCC-SLP Speech Therapy

## 2024-06-10 NOTE — Progress Notes (Signed)
 SLP Cancellation Note  Patient Details Name: Clarence Maldonado MRN: [id number] DOB: [birth year]   Cancelled treatment:        Attempted to see pt for swallowing evaluation.  Pt off floor at time of attempt for procedure.  SLP will reattempt as schedule permits.   Anette FORBES Grippe, MA, CCC-SLP Acute Rehabilitation Services Office: [phone] 06/10/2024, 9:45 AM

## 2024-06-10 NOTE — Progress Notes (Signed)
 [facility]                                                                               Clarence Maldonado, is a 78 y.o. male, DOB - [birth year], FMW:[id number] Admit date - 06/09/2024    Outpatient Primary MD for the patient is Gil Greig BRAVO, NP  LOS - 1  days    Brief summary   Clarence Maldonado is a 78 y.o. male with hx of Dementia, CVA, dysphagia, CAD, hx silent Mis, HFimpEF, CKD3b, HTN, HLD, DM type 2, COPD, BPH, R AKA, anxiety / depression, prior smoking, prior pulmonary TB, recent complex hospitalization from 8/8 - 9/5 for respiratory failure requiring intubation in the setting of human metapneumovirus and pseudomonas pneumonia, septic shock in setting of candida glabrata fungemia and perineal abscess requiring vasopressor, and additional problems including AKI, hyperkalemia, urinary retention, and dysphagia; he was brought back in on same day of discharge due to acute hypoxic respiratory failure requiring NIV.    Assessment & Plan    Assessment and Plan:  Acute hypoxic and hypercapneic respiratory failure differential include aspiration pneumonia vs panic attack vs residual pneumonia.  CXR on admission shows Similar multifocal bilateral patchy opacities, in keeping with known pneumonia and sequela of prior infection/inflammation. He is on 2lit of [place] oxygen .  Blood cultures done and pending.  Resume IV cefepime  and linezolid . Patient just completed a course of antibiotics for perineal abscess   Acute metabolic encephalopathy Appears to have improved.     Stage 3b CKD Creatinine appears to be at baseline.  US  renal done and reviewed. Recommendoutpatient non urgent MRI of the kidneys.    Hyperkalemia Lokelma  ordered and repeat K is pending.    Dysphagia SLP eval done  Pt on dysphagia 3 diet.    Severe protein calorie malnutrition.  Encourage oral intake.     Chronic diastolic CHF He appears euvolemic.    Type 2 DM CBG (last 3)  Recent Labs     06/10/24 0832 06/10/24 1203 06/10/24 1555  GLUCAP 151* 112* 213*   Resume SSI.    Hypertension Well controlled.  Remains off metoprolol  and cardura .    BPH  On flomax .    Perineal wound Wound care consulted.      Estimated body mass index is 20.85 kg/m as calculated from the following:   Height as of this encounter: 5' 6 (1.676 m).   Weight as of this encounter: 58.6 kg.  Code Status: full code.  DVT Prophylaxis:  heparin  injection 5,000 Units Start: 06/09/24 2200   Level of Care: Level of care: Progressive Family Communication: none at bedside.  Disposition Plan:     Remains inpatient appropriate: pending clinical improvement.   Procedures:    Consultants:   SLP  Antimicrobials:   Anti-infectives (From admission, onward)    Start     Dose/Rate Route Frequency Ordered Stop   06/10/24 2200  linezolid  (ZYVOX ) IVPB 600 mg        600 mg 300 mL/hr over 60 Minutes Intravenous Every 12 hours 06/10/24 1200     06/09/24 2102  vancomycin  variable dose per unstable renal function (pharmacist dosing)  Status:  Discontinued         Does not apply See admin instructions 06/09/24 2103 06/10/24 1200   06/09/24 2100  vancomycin  (VANCOREADY) IVPB 1250 mg/250 mL        1,250 mg 166.7 mL/hr over 90 Minutes Intravenous  Once 06/09/24 2057 06/10/24 0021   06/09/24 2045  ceFEPIme  (MAXIPIME ) 2 g in sodium chloride  0.9 % 100 mL IVPB        2 g 200 mL/hr over 30 Minutes Intravenous Every 24 hours 06/09/24 2040          Medications  Scheduled Meds:  budesonide   0.5 mg Nebulization BID   heparin   5,000 Units Subcutaneous Q8H   insulin  aspart  0-6 Units Subcutaneous Q4H   ipratropium-albuterol   3 mL Nebulization Q6H   sodium chloride  flush  3 mL Intravenous Q12H   Continuous Infusions:  acetaminophen      ceFEPime  (MAXIPIME ) IV Stopped (06/09/24 2230)   linezolid  (ZYVOX ) IV     PRN Meds:.acetaminophen , albuterol , haloperidol  lactate, ondansetron  (ZOFRAN )  IV    Subjective:   Clarence Maldonado was seen and examined today.  Confused.   Objective:   Vitals:   06/10/24 0833 06/10/24 1158 06/10/24 1258 06/10/24 1543  BP: 120/71 131/78    Pulse:      Resp: 16     Temp: 97.8 F (36.6 C) 97.8 F (36.6 C)  97.9 F (36.6 C)  TempSrc: Oral Oral  Oral  SpO2: 99%  99%   Weight:      Height:        Intake/Output Summary (Last 24 hours) at 06/10/2024 1903 Last data filed at 06/10/2024 1000 Gross per 24 hour  Intake 3 ml  Output 1050 ml  Net -1047 ml   Filed Weights   06/09/24 1750 06/09/24 2219 06/10/24 0459  Weight: 57.4 kg 58.6 kg 58.6 kg     Exam General exam: ill appearing gentleman, not in distress.  Respiratory system: Clear to auscultation. Respiratory effort normal. Cardiovascular system: S1 & S2 heard, RRR. No JVD,  Gastrointestinal system: Abdomen is nondistended, soft and nontender.  Central nervous system: Alert and oriented to person and place only. Confused.  Extremities: no edema.  Skin: perineal wound bandaged.  Psychiatry: confused.    Data Reviewed:  I have personally reviewed following labs and imaging studies   CBC Lab Results  Component Value Date   WBC 5.1 06/10/2024   RBC 3.58 (L) 06/10/2024   HGB 9.5 (L) 06/10/2024   HCT 32.0 (L) 06/10/2024   MCV 89.4 06/10/2024   MCH 26.5 06/10/2024   PLT 130 (L) 06/10/2024   MCHC 29.7 (L) 06/10/2024   RDW 17.4 (H) 06/10/2024   LYMPHSABS 1.6 06/09/2024   MONOABS 0.6 06/09/2024   EOSABS 0.3 06/09/2024   BASOSABS 0.0 06/09/2024     Last metabolic panel Lab Results  Component Value Date   NA 137 06/10/2024   K 5.2 (H) 06/10/2024   CL 99 06/10/2024   CO2 26 06/10/2024   BUN 60 (H) 06/10/2024   CREATININE 2.14 (H) 06/10/2024   GLUCOSE 207 (H) 06/10/2024   GFRNONAA 31 (L) 06/10/2024   GFRAA 54 (L) 06/13/2020   CALCIUM  8.3 (L) 06/10/2024   PHOS 4.0 06/10/2024   PROT 7.2 06/09/2024   ALBUMIN 2.4 (L) 06/09/2024   LABGLOB 2.8 01/08/2016   AGRATIO 1.6  01/08/2016   BILITOT 0.6 06/09/2024   ALKPHOS 112 06/09/2024   AST 51 (H) 06/09/2024   ALT 62 (H) 06/09/2024   ANIONGAP  12 06/10/2024    CBG (last 3)  Recent Labs    06/10/24 0832 06/10/24 1203 06/10/24 1555  GLUCAP 151* 112* 213*      Coagulation Profile: No results for input(s): INR, PROTIME in the last 168 hours.   Radiology Studies: US  RENAL Result Date: 06/10/2024 CLINICAL DATA:  Acute kidney injury EXAM: RENAL / URINARY TRACT ULTRASOUND COMPLETE COMPARISON:  None Available. FINDINGS: Right Kidney: Length = 13.3 cm Normal parenchymal echogenicity with preserved corticomedullary differentiation. Exophytic cyst in the lower pole measures 7.5 x 7.1 x 7.0 cm with a possible thick internal septation along the inferior aspect. No urinary tract dilation or shadowing calculi. The ureter is not seen. Left Kidney: Length = 8.5 cm Diffusely increased cortical echogenicity with loss of corticomedullary differentiation which can be seen with medical renal disease. No urinary tract dilation or shadowing calculi. The ureter is not seen. Bladder: Layering, avascular hypoechogenicity within the urinary bladder. Other: None. IMPRESSION: 1. No urinary tract dilation or shadowing calculi. 2. Layering, avascular hypoechogenicity within the urinary bladder, which may represent debris or blood products. Recommend correlation with urinalysis. 3. Exophytic cyst in the lower pole of the right kidney measures 7.5 x 7.1 x 7.0 cm with a possible thick internal septation along the inferior aspect. Recommend further evaluation with nonemergent contrast-enhanced renal protocol MRI or CT. 4. Diffusely increased cortical echogenicity of the atrophic left kidney with loss of corticomedullary differentiation which can be seen with medical renal disease. Electronically Signed   By: Limin  Xu M.D.   On: 06/10/2024 11:11   DG Chest Portable 1 View Result Date: 06/09/2024 CLINICAL DATA:  Respiratory distress EXAM:  PORTABLE CHEST 1 VIEW COMPARISON:  CT chest dated 06/06/2024, chest radiograph dated 06/05/2024 FINDINGS: Normal lung volumes. Similar multifocal bilateral patchy opacities. No pleural effusion or pneumothorax. The heart size and mediastinal contours are within normal limits. No acute osseous abnormality. Old left clavicular fracture deformity. IMPRESSION: Similar multifocal bilateral patchy opacities, in keeping with known pneumonia and sequela of prior infection/inflammation. Electronically Signed   By: Limin  Xu M.D.   On: 06/09/2024 18:27       Elgie Butter M.D. [facility] 06/10/2024, 7:03 PM  Available via Epic secure chat 7am-7pm After 7 pm, please refer to night coverage provider listed on amion.

## 2024-06-10 NOTE — Consult Note (Signed)
 WOC Nurse Consult Note: patient familiar to WOC team from previous admission; had I&D perineal/scrotal wound by Dr. Selma 05/19/2024 with instructions for daily NS gauze dressing changes  Reason for Consult:perineal wound  Wound type: full thickness scrotum post I&D as above  Pressure Injury POA: NA not pressure  Measurement: see nursing flowsheet  Wound azi:jeezjmd clean  Drainage (amount, consistency, odor) see nursing flowsheet  Periwound: intact  Dressing procedure/placement/frequency: Clean scrotal wound with NS, using a q tip applicator insert saline moistened gauze/Kerlix roll gauze into wound bed daily, cover with dry gauze/ABD pad and secure with mesh underwear.   POC discussed with bedside nurse. Patient should follow with urology as outpatient as scheduled.  WOC team will not follow. Reconsult if further needs arise.   Thank you,    Powell Bar MSN, RN-BC, [employer]

## 2024-06-10 NOTE — Evaluation (Signed)
 Physical Therapy Evaluation Patient Details Name: Clarence Maldonado MRN: [id number] DOB: 01/10/1946 Today's Date: 06/10/2024  History of Present Illness  Pt is a 78 y/o male readmitted 9/5 from SNF after d/c same day from [facility] for respiratory failure.  Returns today with respiratory distress and sats in the 60's on 4L O2.   PMHx significant for HTN, HLD, COPD, CKD 3b, DMT2, BPH, R AKA 1/25, anxiety/depression.  Clinical Impression  Pt admitted with/for respiratory distress.  Needs max for all, but bed mobility.  Presently limited mobility.  Pt currently limited functionally due to the problems listed. ( See problems list.)   Pt will benefit from PT to maximize function and safety in order to get ready for next venue listed below.         If plan is discharge home, recommend the following: A lot of help with walking and/or transfers;A lot of help with bathing/dressing/bathroom;Assistance with cooking/housework;Direct supervision/assist for medications management;Direct supervision/assist for financial management;Assist for transportation;Help with stairs or ramp for entrance;Supervision due to cognitive status   Can travel by private vehicle   No    Equipment Recommendations Other (comment) (TBD)  Recommendations for Other Services       Functional Status Assessment Patient has had a recent decline in their functional status and demonstrates the ability to make significant improvements in function in a reasonable and predictable amount of time.     Precautions / Restrictions Precautions Precautions: Fall Recall of Precautions/Restrictions: Impaired Precaution/Restrictions Comments: condom cath      Mobility  Bed Mobility Overal bed mobility: Needs Assistance Bed Mobility: Supine to Sit, Sit to Supine     Supine to sit: Min assist Sit to supine: Min assist   General bed mobility comments: stability and forward assist to come to EOB    Transfers Overall transfer level: Needs  assistance Equipment used: None Transfers: Sit to/from Stand Sit to Stand: Max assist          Lateral/Scoot Transfers: Max assist General transfer comment: face to face assist with mas assist and difficulty sequencing and boosting    Ambulation/Gait                  Stairs            Wheelchair Mobility     Tilt Bed    Modified Rankin (Stroke Patients Only)       Balance Overall balance assessment: Needs assistance   Sitting balance-Leahy Scale: Fair     Standing balance support: Bilateral upper extremity supported, During functional activity Standing balance-Leahy Scale: Poor Standing balance comment: Reliant on external support                             Pertinent Vitals/Pain Pain Assessment Pain Assessment: Faces Faces Pain Scale: No hurt Pain Intervention(s): Monitored during session    Home Living Family/patient expects to be discharged to:: Skilled nursing facility                   Additional Comments: poor historian    Prior Function               Mobility Comments: possibly has prosthetic for R LE       Extremity/Trunk Assessment   Upper Extremity Assessment Upper Extremity Assessment: Generalized weakness    Lower Extremity Assessment Lower Extremity Assessment:  (L LE generally weak, difficulty coming into standing) RLE Deficits / Details: R AKA, moving  against gravity, generally weak    Cervical / Trunk Assessment Cervical / Trunk Assessment: Kyphotic  Communication   Communication Factors Affecting Communication: Difficulty expressing self;Reduced clarity of speech    Cognition Arousal: Alert Behavior During Therapy: WFL for tasks assessed/performed   PT - Cognitive impairments: No family/caregiver present to determine baseline, Awareness, Problem solving, Safety/Judgement Difficult to assess due to: Impaired communication Orientation impairments: Time, Situation                    PT - Cognition Comments: Pt appears to be easily distracted by many things. Tends to ramble however was able to follow commands more than previous session. Following commands: Impaired Following commands impaired: Follows one step commands with increased time, Follows one step commands inconsistently (needs repetition)     Cueing Cueing Techniques: Verbal cues, Tactile cues     General Comments General comments (skin integrity, edema, etc.): vss, pt consistently coughing and producing phlegm and saliva    Exercises     Assessment/Plan    PT Assessment Patient needs continued PT services  PT Problem List Decreased strength;Decreased activity tolerance;Decreased balance;Decreased mobility;Decreased coordination;Decreased cognition;Impaired sensation;Decreased skin integrity;Pain       PT Treatment Interventions DME instruction;Gait training;Functional mobility training;Therapeutic activities;Therapeutic exercise;Balance training;Cognitive remediation;Patient/family education    PT Goals (Current goals can be found in the Care Plan section)  Acute Rehab PT Goals Patient Stated Goal: figure out where I'm going to end up. PT Goal Formulation: Patient unable to participate in goal setting Time For Goal Achievement: 06/09/24 Potential to Achieve Goals: Fair    Frequency Min 2X/week     Co-evaluation               AM-PAC PT 6 Clicks Mobility  Outcome Measure Help needed turning from your back to your side while in a flat bed without using bedrails?: A Lot Help needed moving from lying on your back to sitting on the side of a flat bed without using bedrails?: A Lot Help needed moving to and from a bed to a chair (including a wheelchair)?: Total Help needed standing up from a chair using your arms (e.g., wheelchair or bedside chair)?: A Lot Help needed to walk in hospital room?: Total Help needed climbing 3-5 steps with a railing? : Total 6 Click Score: 9    End of  Session     Patient left: in bed;with call bell/phone within reach;with bed alarm set Nurse Communication: Mobility status;Need for lift equipment PT Visit Diagnosis: Difficulty in walking, not elsewhere classified (R26.2);Muscle weakness (generalized) (M62.81);Unsteadiness on feet (R26.81);Other symptoms and signs involving the nervous system (R29.898);Adult, failure to thrive (R62.7);Other abnormalities of gait and mobility (R26.89)    Time: 8449-8384 PT Time Calculation (min) (ACUTE ONLY): 25 min   Charges:   PT Evaluation $PT Eval Moderate Complexity: 1 Mod PT Treatments $Therapeutic Activity: 8-22 mins PT General Charges $$ ACUTE PT VISIT: 1 Visit         06/10/2024  India HERO., PT Acute Rehabilitation Services [phone]  (office)  Vinie GAILS Preet Mangano 06/10/2024, 5:54 PM

## 2024-06-11 DIAGNOSIS — E1169 Type 2 diabetes mellitus with other specified complication: Secondary | ICD-10-CM | POA: Diagnosis not present

## 2024-06-11 DIAGNOSIS — J690 Pneumonitis due to inhalation of food and vomit: Secondary | ICD-10-CM | POA: Diagnosis not present

## 2024-06-11 DIAGNOSIS — I10 Essential (primary) hypertension: Secondary | ICD-10-CM | POA: Diagnosis not present

## 2024-06-11 DIAGNOSIS — J9601 Acute respiratory failure with hypoxia: Secondary | ICD-10-CM | POA: Diagnosis not present

## 2024-06-11 LAB — GLUCOSE, CAPILLARY
Glucose-Capillary: 152 mg/dL — ABNORMAL HIGH (ref 70–99)
Glucose-Capillary: 88 mg/dL (ref 70–99)
Glucose-Capillary: 137 mg/dL — ABNORMAL HIGH (ref 70–99)
Glucose-Capillary: 198 mg/dL — ABNORMAL HIGH (ref 70–99)
Glucose-Capillary: 139 mg/dL — ABNORMAL HIGH (ref 70–99)

## 2024-06-11 LAB — CBC WITH DIFFERENTIAL/PLATELET
Abs Immature Granulocytes: 0.05 K/uL (ref 0.00–0.07)
Basophils Absolute: 0 K/uL (ref 0.0–0.1)
Basophils Relative: 1 %
Eosinophils Absolute: 0.2 K/uL (ref 0.0–0.5)
Eosinophils Relative: 3 %
HCT: 31.3 % — ABNORMAL LOW (ref 39.0–52.0)
Hemoglobin: 9.4 g/dL — ABNORMAL LOW (ref 13.0–17.0)
Immature Granulocytes: 1 %
Lymphocytes Relative: 12 %
Lymphs Abs: 0.8 K/uL (ref 0.7–4.0)
MCH: 26.9 pg (ref 26.0–34.0)
MCHC: 30 g/dL (ref 30.0–36.0)
MCV: 89.7 fL (ref 80.0–100.0)
Monocytes Absolute: 0.5 K/uL (ref 0.1–1.0)
Monocytes Relative: 7 %
Neutro Abs: 5.3 K/uL (ref 1.7–7.7)
Neutrophils Relative %: 76 %
Platelets: 138 K/uL — ABNORMAL LOW (ref 150–400)
RBC: 3.49 MIL/uL — ABNORMAL LOW (ref 4.22–5.81)
RDW: 17.9 % — ABNORMAL HIGH (ref 11.5–15.5)
WBC: 7 K/uL (ref 4.0–10.5)
nRBC: 0 % (ref 0.0–0.2)

## 2024-06-11 LAB — BASIC METABOLIC PANEL WITH GFR
Anion gap: 14 (ref 5–15)
BUN: 54 mg/dL — ABNORMAL HIGH (ref 8–23)
CO2: 24 mmol/L (ref 22–32)
Calcium: 8.6 mg/dL — ABNORMAL LOW (ref 8.9–10.3)
Chloride: 98 mmol/L (ref 98–111)
Creatinine, Ser: 2.09 mg/dL — ABNORMAL HIGH (ref 0.61–1.24)
GFR, Estimated: 32 mL/min — ABNORMAL LOW (ref >60)
Glucose, Bld: 187 mg/dL — ABNORMAL HIGH (ref 70–99)
Potassium: 4.7 mmol/L (ref 3.5–5.1)
Sodium: 136 mmol/L (ref 135–145)

## 2024-06-11 MED ORDER — DOXAZOSIN MESYLATE 2 MG PO TABS
2.0000 mg | ORAL_TABLET | Freq: Every day | ORAL | Status: DC
  Administered 2024-06-11 – 2024-06-15 (×5): 2 mg via ORAL
  Filled 2024-06-11 (×5): qty 1

## 2024-06-11 MED ORDER — INSULIN ASPART 100 UNIT/ML IJ SOLN
0.0000 [IU] | Freq: Three times a day (TID) | INTRAMUSCULAR | Status: DC
  Administered 2024-06-11: 1 [IU] via SUBCUTANEOUS
  Administered 2024-06-12: 3 [IU] via SUBCUTANEOUS
  Administered 2024-06-13: 1 [IU] via SUBCUTANEOUS
  Administered 2024-06-14: 2 [IU] via SUBCUTANEOUS
  Administered 2024-06-15: 1 [IU] via SUBCUTANEOUS
  Administered 2024-06-15: 4 [IU] via SUBCUTANEOUS

## 2024-06-11 MED ORDER — METOPROLOL TARTRATE 12.5 MG HALF TABLET
12.5000 mg | ORAL_TABLET | Freq: Two times a day (BID) | ORAL | Status: DC
  Administered 2024-06-11 – 2024-06-15 (×8): 12.5 mg via ORAL
  Filled 2024-06-11 (×8): qty 1

## 2024-06-11 NOTE — [place] FL2 (Signed)
 [place]  MEDICAID FL2 LEVEL OF CARE FORM     IDENTIFICATION  Patient Name: Clarence Maldonado Birthdate: May 16, 1946 Sex: male Admission Date (Current Location): 06/09/2024  [facility] and [place] Number:      Facility and Address:  The [place], [address] [place], [place] [address]      Provider Number: [id number]  Attending Physician Name and Address:  Cherlyn Labella, MD  Relative Name and Phone Number:  [name],[name] (Relative)  [phone]    Current Level of Care: Hospital Recommended Level of Care: Skilled Nursing Facility Prior Approval Number:    Date Approved/Denied:   PASRR Number: [id number]  Discharge Plan: Home    Current Diagnoses: Patient Active Problem List   Diagnosis Date Noted   Acute respiratory failure with hypoxia and hypercapnia (HCC) 06/09/2024   Protein-calorie malnutrition, severe 05/19/2024   Acute respiratory failure with hypercapnia (HCC) 05/12/2024   Malnutrition of moderate degree 10/27/2023   Cellulitis and abscess of right lower extremity 10/23/2023   Cellulitis 10/22/2023   Aortic atherosclerosis (HCC) 02/17/2023   Dyspnea on exertion 09/16/2022   History of TB (tuberculosis) 10/17/2020   Nonischemic cardiomyopathy (HCC) 03/13/2020   Contact with or exposure to viral disease 02/01/2020   Special screening examination for respiratory tuberculosis 02/01/2020   Chest pain 01/26/2020   Acute lower UTI 07/23/2019   Fatigue    Lung infiltrate    Pulmonary tuberculosis with cavitation 06/06/2019   Moderate protein-calorie malnutrition (HCC) 06/06/2019   Hypokalemia 05/15/2019   Anemia 05/15/2019   Hypermetropia of both eyes 03/21/2018   Weight loss 03/21/2018   Hyperlipidemia associated with type 2 diabetes mellitus (HCC) 03/21/2018   Insomnia 02/03/2017   Cognitive change 01/29/2016   Illiterate 01/03/2014   Controlled type 2 diabetes mellitus with stage 3 chronic kidney disease, without long-term current use  of insulin  (HCC) 01/05/2013   Stranguria 01/05/2013   Essential hypertension 01/04/2013   CKD (chronic kidney disease) stage 3, GFR 30-59 ml/min (HCC) 01/04/2013   Hyperlipidemia 01/04/2013   Tobacco use disorder 01/04/2013   BPH without obstruction/lower urinary tract symptoms 01/04/2013    Orientation RESPIRATION BLADDER Height & Weight     Self  O2 Continent, Indwelling catheter Weight: 120 lb 13 oz (54.8 kg) Height:  5' 6 (167.6 cm)  BEHAVIORAL SYMPTOMS/MOOD NEUROLOGICAL BOWEL NUTRITION STATUS      Incontinent Diet  AMBULATORY STATUS COMMUNICATION OF NEEDS Skin   Extensive Assist Verbally Surgical wounds (Wound 05/19/24 0724 Surgical Closed Surgical Incision Scrotum Other (Comment))                       Personal Care Assistance Level of Assistance  Bathing, Feeding, Dressing Bathing Assistance: Independent Feeding assistance: Maximum assistance Dressing Assistance: Maximum assistance     Functional Limitations Info  Sight, Hearing, Speech Sight Info: Adequate Hearing Info: Impaired Speech Info: Adequate    SPECIAL CARE FACTORS FREQUENCY  PT (By licensed PT), OT (By licensed OT)     PT Frequency: 5x/week OT Frequency: 5x/week            Contractures Contractures Info: Not present    Additional Factors Info  Code Status, Allergies, Isolation Precautions Code Status Info: FULL Allergies Info: NKA     Isolation Precautions Info: MRSA     Current Medications (06/11/2024):  This is the current hospital active medication list Current Facility-Administered Medications  Medication Dose Route Frequency Provider Last Rate Last Admin   albuterol  (PROVENTIL ) (2.5 MG/3ML) 0.083%  nebulizer solution 2.5 mg  2.5 mg Nebulization Q4H PRN Segars, Dorn, MD       atorvastatin  (LIPITOR) tablet 40 mg  40 mg Oral Daily Akula, Vijaya, MD   40 mg at 06/11/24 0807   budesonide  (PULMICORT ) nebulizer solution 0.5 mg  0.5 mg Nebulization BID Segars, Jonathan, MD   0.5 mg at  06/11/24 0858   ceFEPIme  (MAXIPIME ) 2 g in sodium chloride  0.9 % 100 mL IVPB  2 g Intravenous Q24H Keturah Dorn, MD   Stopped at 06/11/24 0120   ferrous sulfate  tablet 325 mg  325 mg Oral BID WC Akula, Vijaya, MD   325 mg at 06/11/24 0808   haloperidol  lactate (HALDOL ) injection 1 mg  1 mg Intravenous Q6H PRN Segars, Jonathan, MD       heparin  injection 5,000 Units  5,000 Units Subcutaneous Q8H Segars, Jonathan, MD   5,000 Units at 06/11/24 9495   insulin  aspart (novoLOG ) injection 0-6 Units  0-6 Units Subcutaneous Q4H Segars, Jonathan, MD   1 Units at 06/11/24 0504   ipratropium-albuterol  (DUONEB) 0.5-2.5 (3) MG/3ML nebulizer solution 3 mL  3 mL Nebulization Q6H Segars, Dorn, MD   3 mL at 06/11/24 0858   linezolid  (ZYVOX ) IVPB 600 mg  600 mg Intravenous Q12H Akula, Vijaya, MD 300 mL/hr at 06/11/24 1004 600 mg at 06/11/24 1004   ondansetron  (ZOFRAN ) injection 4 mg  4 mg Intravenous Q6H PRN Keturah Dorn, MD       sodium chloride  flush (NS) 0.9 % injection 3 mL  3 mL Intravenous Q12H Segars, Jonathan, MD   3 mL at 06/11/24 9191   tamsulosin  (FLOMAX ) capsule 0.4 mg  0.4 mg Oral Daily Akula, Vijaya, MD   0.4 mg at 06/11/24 0808     Discharge Medications: Please see discharge summary for a list of discharge medications.  Relevant Imaging Results:  Relevant Lab Results:   Additional Information SS# [id number]  Clarence Maldonado A [place], LCSW

## 2024-06-11 NOTE — TOC Initial Note (Addendum)
 Transition of Care [facility]) - Initial/Assessment Note    Patient Details  Name: Clarence Maldonado MRN: [id number] Date of Birth: 04/10/1946  Transition of Care [facility]) CM/SW Contact:    Othello Dickenson A [place], LCSW Phone Number: 06/11/2024, 11:20 AM  Clinical Narrative:                 Pt from [facility] SNF for long term care. Pt recently Dc'd there, plan to return at discharge. Pt oriented x1, CSW completed assessment with pt's relative [name]. He stated that pt has been at [facility] for last few months post AKA. He stated he is pt's main support so requested CSW reach out to him first if possible.   CSW informed [facility] of pt's admission, pt to return at discharge. Facility requesting insurance authorization to return, but since long term care, if not approved, can still re-admit. CSW to complete once pt closer to medical stability.   CSW will continue to follow.  Expected Discharge Plan: Skilled Nursing Facility Barriers to Discharge: Continued Medical Work up, English as a second language teacher   Patient Goals and CMS Choice Patient states their goals for this hospitalization and ongoing recovery are:: Return to SNF CMS Medicare.gov Compare Post Acute Care list provided to:: Patient Represenative (must comment) Vilinda Novak, relative) Choice offered to / list presented to :  (pt's relative, Norleen)      Expected Discharge Plan and Services In-house Referral: Clinical Social Work   Post Acute Care Choice: NA Living arrangements for the past 2 months: Skilled Nursing Facility                                      Prior Living Arrangements/Services Living arrangements for the past 2 months: Skilled Nursing Facility Lives with:: Facility Resident Patient language and need for interpreter reviewed:: Yes Do you feel safe going back to the place where you live?: Yes      Need for Family Participation in Patient Care: Yes (Comment) Care giver support system in place?: Yes (comment) (pt's relative  Norleen)   Criminal Activity/Legal Involvement Pertinent to Current Situation/Hospitalization: No - Comment as needed  Activities of Daily Living      Permission Sought/Granted Permission sought to share information with : Photographer granted to share info w AGENCY: [facility]        Emotional Assessment Appearance:: Appears older than stated age Attitude/Demeanor/Rapport: Unable to Assess Affect (typically observed): Unable to Assess   Alcohol / Substance Use: Not Applicable Psych Involvement: No (comment)  Admission diagnosis:  Hypoxemia [R09.02] Respiratory distress [R06.03] Acute respiratory failure with hypoxia and hypercapnia (HCC) [J96.01, J96.02] Patient Active Problem List   Diagnosis Date Noted   Acute respiratory failure with hypoxia and hypercapnia (HCC) 06/09/2024   Protein-calorie malnutrition, severe 05/19/2024   Acute respiratory failure with hypercapnia (HCC) 05/12/2024   Malnutrition of moderate degree 10/27/2023   Cellulitis and abscess of right lower extremity 10/23/2023   Cellulitis 10/22/2023   Aortic atherosclerosis (HCC) 02/17/2023   Dyspnea on exertion 09/16/2022   History of TB (tuberculosis) 10/17/2020   Nonischemic cardiomyopathy (HCC) 03/13/2020   Contact with or exposure to viral disease 02/01/2020   Special screening examination for respiratory tuberculosis 02/01/2020   Chest pain 01/26/2020   Acute lower UTI 07/23/2019   Fatigue    Lung infiltrate    Pulmonary tuberculosis with cavitation  06/06/2019   Moderate protein-calorie malnutrition (HCC) 06/06/2019   Hypokalemia 05/15/2019   Anemia 05/15/2019   Hypermetropia of both eyes 03/21/2018   Weight loss 03/21/2018   Hyperlipidemia associated with type 2 diabetes mellitus (HCC) 03/21/2018   Insomnia 02/03/2017   Cognitive change 01/29/2016   Illiterate 01/03/2014   Controlled type 2 diabetes mellitus with stage 3 chronic kidney disease,  without long-term current use of insulin  (HCC) 01/05/2013   Stranguria 01/05/2013   Essential hypertension 01/04/2013   CKD (chronic kidney disease) stage 3, GFR 30-59 ml/min (HCC) 01/04/2013   Hyperlipidemia 01/04/2013   Tobacco use disorder 01/04/2013   BPH without obstruction/lower urinary tract symptoms 01/04/2013   PCP:  Gil Greig BRAVO, NP Pharmacy:   [id number] - [address], [place] - [address] AT [facility] [address] [place] [address] Phone: [phone] Fax: [phone]  [facility] [place], [place] - [address] [place] [address] Phone: [phone] Fax: [phone]     Social Drivers of Health (SDOH) Social History: SDOH Screenings   Food Insecurity: No Food Insecurity (10/20/2023)  Housing: Unknown (10/20/2023)  Transportation Needs: No Transportation Needs (10/20/2023)  Utilities: Not At Risk (10/20/2023)  Depression (PHQ2-9): Low Risk  (10/14/2023)  Financial Resource Strain: Low Risk  (03/23/2019)  Physical Activity: Inactive (03/23/2019)  Social Connections: Socially Isolated (03/23/2019)  Stress: No Stress Concern Present (03/23/2019)  Tobacco Use: Medium Risk (06/09/2024)   SDOH Interventions:     Readmission Risk Interventions    06/08/2024    3:02 PM  Readmission Risk Prevention Plan  Transportation Screening Complete  Medication Review (RN Care Manager) Complete  PCP or Specialist appointment within 3-5 days of discharge Complete  HRI or Home Care Consult Complete  SW Recovery Care/Counseling Consult Complete  Palliative Care Screening Not Applicable  Skilled Nursing Facility Complete

## 2024-06-11 NOTE — Progress Notes (Signed)
 [facility]                                                                               Clarence Maldonado, is a 78 y.o. male, DOB - 12-19-1945, FMW:[id number] Admit date - 06/09/2024    Outpatient Primary MD for the patient is Gil Greig BRAVO, NP  LOS - 2  days    Brief summary   Clarence Maldonado is a 78 y.o. male with hx of Dementia, CVA, dysphagia, CAD, hx silent Mis, HFimpEF, CKD3b, HTN, HLD, DM type 2, COPD, BPH, R AKA, anxiety / depression, prior smoking, prior pulmonary TB, recent complex hospitalization from 8/8 - 9/5 for respiratory failure requiring intubation in the setting of human metapneumovirus and pseudomonas pneumonia, septic shock in setting of candida glabrata fungemia and perineal abscess requiring vasopressor, and additional problems including AKI, hyperkalemia, urinary retention, and dysphagia; he was brought back in on same day of discharge due to acute hypoxic respiratory failure requiring NIV.    Assessment & Plan    Assessment and Plan:  Acute hypoxic and hypercapneic respiratory failure differential include aspiration pneumonia vs panic attack vs residual pneumonia.  CXR on admission shows Similar multifocal bilateral patchy opacities, in keeping with known pneumonia and sequela of prior infection/inflammation. He is on 3 lit of Olivia Lopez de Gutierrez oxygen .  Blood cultures done and pending. Negative sofar.  Resume IV cefepime  and linezolid . Patient just completed a course of antibiotics for perineal abscess   Acute metabolic encephalopathy Appears to have improved.  He is alert and conversing well.     Stage 3b CKD Creatinine appears to be at baseline.  US  renal done and reviewed. Recommend outpatient non urgent MRI of the kidneys.    Hyperkalemia Lokelma  ordered  Repeat K wnl.    Dysphagia SLP eval done  Pt on dysphagia 3 diet.    Severe protein calorie malnutrition.  Encourage oral intake.     Chronic diastolic CHF He appears euvolemic.     Type 2 DM CBG (last 3)  Recent Labs    06/11/24 0805 06/11/24 1152 06/11/24 1511  GLUCAP 88 137* 198*   Resume SSI.    Hypertension Optimal.  Restart metoprolol .    BPH  On flomax .    Perineal wound Wound care consulted.      Estimated body mass index is 19.5 kg/m as calculated from the following:   Height as of this encounter: 5' 6 (1.676 m).   Weight as of this encounter: 54.8 kg.  Code Status: full code.  DVT Prophylaxis:  heparin  injection 5,000 Units Start: 06/09/24 2200   Level of Care: Level of care: Progressive Family Communication: none at bedside.  Disposition Plan:     Remains inpatient appropriate: pending clinical improvement.   Procedures:  None.   Consultants:   SLP Wound care.   Antimicrobials:   Anti-infectives (From admission, onward)    Start     Dose/Rate Route Frequency Ordered Stop   06/10/24 2200  linezolid  (ZYVOX ) IVPB 600 mg        600 mg 300 mL/hr over 60 Minutes Intravenous Every 12 hours 06/10/24 1200     06/09/24 2102  vancomycin  variable  dose per unstable renal function (pharmacist dosing)  Status:  Discontinued         Does not apply See admin instructions 06/09/24 2103 06/10/24 1200   06/09/24 2100  vancomycin  (VANCOREADY) IVPB 1250 mg/250 mL        1,250 mg 166.7 mL/hr over 90 Minutes Intravenous  Once 06/09/24 2057 06/10/24 1900   06/09/24 2045  ceFEPIme  (MAXIPIME ) 2 g in sodium chloride  0.9 % 100 mL IVPB        2 g 200 mL/hr over 30 Minutes Intravenous Every 24 hours 06/09/24 2040          Medications  Scheduled Meds:  atorvastatin   40 mg Oral Daily   budesonide   0.5 mg Nebulization BID   ferrous sulfate   325 mg Oral BID WC   heparin   5,000 Units Subcutaneous Q8H   insulin  aspart  0-6 Units Subcutaneous TID WC   ipratropium-albuterol   3 mL Nebulization Q6H   sodium chloride  flush  3 mL Intravenous Q12H   tamsulosin   0.4 mg Oral Daily   Continuous Infusions:  ceFEPime  (MAXIPIME ) IV Stopped  (06/11/24 0120)   linezolid  (ZYVOX ) IV Stopped (06/11/24 1110)   PRN Meds:.albuterol , haloperidol  lactate, ondansetron  (ZOFRAN ) IV    Subjective:   [name] was seen and examined today. More alert and answering questions appropriately.   Objective:   Vitals:   06/11/24 0813 06/11/24 1154 06/11/24 1446 06/11/24 1513  BP: (!) 125/55 124/63  111/72  Pulse: 76 74 79 (!) 104  Resp: (!) 22 13 13 17   Temp: 97.9 F (36.6 C) (!) 97.4 F (36.3 C)  97.8 F (36.6 C)  TempSrc: Oral Oral  Oral  SpO2: 97% 97% 94% 93%  Weight:      Height:        Intake/Output Summary (Last 24 hours) at 06/11/2024 1842 Last data filed at 06/11/2024 1300 Gross per 24 hour  Intake 240 ml  Output 1000 ml  Net -760 ml   Filed Weights   06/09/24 2219 06/10/24 0459 06/11/24 0440  Weight: 58.6 kg 58.6 kg 54.8 kg     Exam General exam: Appears calm and comfortable  Respiratory system: Clear to auscultation. Respiratory effort normal. Cardiovascular system: S1 & S2 heard, RRR. No JVD, Gastrointestinal system: Abdomen is nondistended, soft and nontender.  Central nervous system: Alert and oriented to person only Extremities: no edema.  Skin: perineal wound bandaged.  Psychiatry: Mood & affect appropriate.     Data Reviewed:  I have personally reviewed following labs and imaging studies   CBC Lab Results  Component Value Date   WBC 7.0 06/11/2024   RBC 3.49 (L) 06/11/2024   HGB 9.4 (L) 06/11/2024   HCT 31.3 (L) 06/11/2024   MCV 89.7 06/11/2024   MCH 26.9 06/11/2024   PLT 138 (L) 06/11/2024   MCHC 30.0 06/11/2024   RDW 17.9 (H) 06/11/2024   LYMPHSABS 0.8 06/11/2024   MONOABS 0.5 06/11/2024   EOSABS 0.2 06/11/2024   BASOSABS 0.0 06/11/2024     Last metabolic panel Lab Results  Component Value Date   NA 136 06/11/2024   K 4.7 06/11/2024   CL 98 06/11/2024   CO2 24 06/11/2024   BUN 54 (H) 06/11/2024   CREATININE 2.09 (H) 06/11/2024   GLUCOSE 187 (H) 06/11/2024   GFRNONAA 32 (L)  06/11/2024   GFRAA 54 (L) 06/13/2020   CALCIUM  8.6 (L) 06/11/2024   PHOS 4.0 06/10/2024   PROT 7.2 06/09/2024   ALBUMIN 2.4 (L) 06/09/2024  LABGLOB 2.8 01/08/2016   AGRATIO 1.6 01/08/2016   BILITOT 0.6 06/09/2024   ALKPHOS 112 06/09/2024   AST 51 (H) 06/09/2024   ALT 62 (H) 06/09/2024   ANIONGAP 14 06/11/2024    CBG (last 3)  Recent Labs    06/11/24 0805 06/11/24 1152 06/11/24 1511  GLUCAP 88 137* 198*      Coagulation Profile: No results for input(s): INR, PROTIME in the last 168 hours.   Radiology Studies: US  RENAL Result Date: 06/10/2024 CLINICAL DATA:  Acute kidney injury EXAM: RENAL / URINARY TRACT ULTRASOUND COMPLETE COMPARISON:  None Available. FINDINGS: Right Kidney: Length = 13.3 cm Normal parenchymal echogenicity with preserved corticomedullary differentiation. Exophytic cyst in the lower pole measures 7.5 x 7.1 x 7.0 cm with a possible thick internal septation along the inferior aspect. No urinary tract dilation or shadowing calculi. The ureter is not seen. Left Kidney: Length = 8.5 cm Diffusely increased cortical echogenicity with loss of corticomedullary differentiation which can be seen with medical renal disease. No urinary tract dilation or shadowing calculi. The ureter is not seen. Bladder: Layering, avascular hypoechogenicity within the urinary bladder. Other: None. IMPRESSION: 1. No urinary tract dilation or shadowing calculi. 2. Layering, avascular hypoechogenicity within the urinary bladder, which may represent debris or blood products. Recommend correlation with urinalysis. 3. Exophytic cyst in the lower pole of the right kidney measures 7.5 x 7.1 x 7.0 cm with a possible thick internal septation along the inferior aspect. Recommend further evaluation with nonemergent contrast-enhanced renal protocol MRI or CT. 4. Diffusely increased cortical echogenicity of the atrophic left kidney with loss of corticomedullary differentiation which can be seen with medical  renal disease. Electronically Signed   By: Limin  Xu M.D.   On: 06/10/2024 11:11       Elgie Butter M.D. [facility] 06/11/2024, 6:42 PM  Available via Epic secure chat 7am-7pm After 7 pm, please refer to night coverage provider listed on amion.

## 2024-06-12 DIAGNOSIS — J9601 Acute respiratory failure with hypoxia: Secondary | ICD-10-CM | POA: Diagnosis not present

## 2024-06-12 DIAGNOSIS — J9602 Acute respiratory failure with hypercapnia: Secondary | ICD-10-CM | POA: Diagnosis not present

## 2024-06-12 LAB — GLUCOSE, CAPILLARY
Glucose-Capillary: 89 mg/dL (ref 70–99)
Glucose-Capillary: 274 mg/dL — ABNORMAL HIGH (ref 70–99)
Glucose-Capillary: 112 mg/dL — ABNORMAL HIGH (ref 70–99)
Glucose-Capillary: 136 mg/dL — ABNORMAL HIGH (ref 70–99)

## 2024-06-12 LAB — BASIC METABOLIC PANEL WITH GFR
Anion gap: 10 (ref 5–15)
BUN: 44 mg/dL — ABNORMAL HIGH (ref 8–23)
CO2: 28 mmol/L (ref 22–32)
Calcium: 8.4 mg/dL — ABNORMAL LOW (ref 8.9–10.3)
Chloride: 98 mmol/L (ref 98–111)
Creatinine, Ser: 2.54 mg/dL — ABNORMAL HIGH (ref 0.61–1.24)
GFR, Estimated: 25 mL/min — ABNORMAL LOW (ref >60)
Glucose, Bld: 279 mg/dL — ABNORMAL HIGH (ref 70–99)
Potassium: 4.5 mmol/L (ref 3.5–5.1)
Sodium: 136 mmol/L (ref 135–145)

## 2024-06-12 LAB — MRSA NEXT GEN BY PCR, NASAL: MRSA by PCR Next Gen: DETECTED — AB

## 2024-06-12 MED ORDER — DOCUSATE SODIUM 50 MG/5ML PO LIQD: 100.0000 mg | Freq: Two times a day (BID) | ORAL | Status: DC | PRN

## 2024-06-12 MED ORDER — FUROSEMIDE 20 MG PO TABS
20.0000 mg | ORAL_TABLET | ORAL | Status: DC
  Administered 2024-06-12 – 2024-06-14 (×2): 20 mg via ORAL
  Filled 2024-06-12 (×2): qty 1

## 2024-06-12 MED ORDER — ENSURE PLUS HIGH PROTEIN PO LIQD
237.0000 mL | Freq: Three times a day (TID) | ORAL | Status: DC
  Administered 2024-06-12 – 2024-06-15 (×7): 237 mL via ORAL

## 2024-06-12 MED ORDER — PANTOPRAZOLE SODIUM 40 MG PO TBEC
40.0000 mg | DELAYED_RELEASE_TABLET | Freq: Every day | ORAL | Status: DC
  Administered 2024-06-12 – 2024-06-15 (×4): 40 mg via ORAL
  Filled 2024-06-12 (×4): qty 1

## 2024-06-12 MED ORDER — MUPIROCIN 2 % EX OINT
1.0000 | TOPICAL_OINTMENT | Freq: Two times a day (BID) | CUTANEOUS | Status: DC
  Administered 2024-06-12 – 2024-06-15 (×6): 1 via NASAL
  Filled 2024-06-12: qty 22

## 2024-06-12 MED ORDER — QUETIAPINE FUMARATE 25 MG PO TABS
25.0000 mg | ORAL_TABLET | Freq: Every day | ORAL | Status: DC
  Administered 2024-06-12 – 2024-06-14 (×3): 25 mg via ORAL
  Filled 2024-06-12 (×3): qty 1

## 2024-06-12 MED ORDER — CHLORHEXIDINE GLUCONATE CLOTH 2 % EX PADS
6.0000 | MEDICATED_PAD | Freq: Every day | CUTANEOUS | Status: DC
  Administered 2024-06-12 – 2024-06-15 (×4): 6 via TOPICAL

## 2024-06-12 MED ORDER — ARFORMOTEROL TARTRATE 15 MCG/2ML IN NEBU: 15.0000 ug | INHALATION_SOLUTION | Freq: Two times a day (BID) | RESPIRATORY_TRACT | Status: DC

## 2024-06-12 MED ORDER — LINEZOLID 600 MG PO TABS
600.0000 mg | ORAL_TABLET | Freq: Two times a day (BID) | ORAL | Status: DC
Start: 2024-06-12 — End: 2024-06-16
  Administered 2024-06-12 – 2024-06-15 (×7): 600 mg via ORAL
  Filled 2024-06-12 (×8): qty 1

## 2024-06-12 MED ORDER — ARFORMOTEROL TARTRATE 15 MCG/2ML IN NEBU
15.0000 ug | INHALATION_SOLUTION | Freq: Two times a day (BID) | RESPIRATORY_TRACT | Status: DC
  Administered 2024-06-12 – 2024-06-15 (×5): 15 ug via RESPIRATORY_TRACT
  Filled 2024-06-12 (×6): qty 2

## 2024-06-12 MED ORDER — BUDESONIDE 0.5 MG/2ML IN SUSP
0.5000 mg | Freq: Two times a day (BID) | RESPIRATORY_TRACT | Status: DC
  Administered 2024-06-12 – 2024-06-15 (×5): 0.5 mg via RESPIRATORY_TRACT
  Filled 2024-06-12 (×6): qty 2

## 2024-06-12 MED ORDER — ADULT MULTIVITAMIN W/MINERALS CH
1.0000 | ORAL_TABLET | Freq: Every day | ORAL | Status: DC
  Administered 2024-06-12 – 2024-06-15 (×4): 1 via ORAL
  Filled 2024-06-12 (×4): qty 1

## 2024-06-12 MED ORDER — REVEFENACIN 175 MCG/3ML IN SOLN
175.0000 ug | Freq: Every day | RESPIRATORY_TRACT | Status: DC
  Administered 2024-06-14 – 2024-06-15 (×2): 175 ug via RESPIRATORY_TRACT
  Filled 2024-06-12 (×3): qty 3

## 2024-06-12 NOTE — Progress Notes (Signed)
 Pharmacy IV > PO Note:  This patient is receiving linezolid  by the intravenous route. Based on criteria approved by the Pharmacy and Therapeutics Committee and the Infectious Disease Division, the antibiotic(s) is/are being converted to equivalent oral dose form(s). These criteria include:   Patient being treated for a respiratory tract infection, urinary tract infection,  cellulitis, or Clostridium difficile associated diarrhea  The patient is not neutropenic and does not exhibit a GI malabsorption state  The patient is eating (either orally or per tube) and/or has been taking other  orally administered medications for at least 24 hours.  The patient is improving clinically (physician assessment and a 24-hour  Tmax of <100.5 F).  If you have questions about this conversion, please contact the pharmacy department.  Thank you.  Elma Fail, PharmD PGY1 Clinical Pharmacist [facility]  06/12/2024 10:43 AM

## 2024-06-12 NOTE — Plan of Care (Signed)

## 2024-06-12 NOTE — Progress Notes (Signed)
 [facility]                                                                               Clarence Maldonado, is a 78 y.o. male, DOB - 1946/07/19, FMW:[id number] Admit date - 06/09/2024    Outpatient Primary MD for the patient is Gil Greig BRAVO, NP  LOS - 3  days    Brief summary   Clarence Maldonado is a 78 y.o. male with hx of Dementia, CVA, dysphagia, CAD, hx silent Mis, HFimpEF, CKD3b, HTN, HLD, DM type 2, COPD, BPH, R AKA, anxiety / depression, prior smoking, prior pulmonary TB, recent complex hospitalization from 8/8 - 9/5 for respiratory failure requiring intubation in the setting of human metapneumovirus and pseudomonas pneumonia, septic shock in setting of candida glabrata fungemia and perineal abscess requiring vasopressor, and additional problems including AKI, hyperkalemia, urinary retention, and dysphagia; he was brought back in on same day of discharge due to acute hypoxic respiratory failure requiring NIV.    Assessment & Plan    Assessment and Plan:  Acute hypoxic and hypercapneic respiratory failure differential include aspiration pneumonia vs panic attack vs residual pneumonia.  CXR on admission shows Similar multifocal bilateral patchy opacities, in keeping with known pneumonia and sequela of prior infection/inflammation. Currently on 2 lit of [place] oxygen .  Blood cultures done and pending. Negative so far. SLP eval , plan MBS in am.  Resume IV cefepime  and linezolid . Patient just completed a course of antibiotics for perineal abscess   Acute metabolic encephalopathy Appears to have improved.  He is alert and conversing well. He appears irritated that he did not get a good night sleep the last 2 days.     Stage 3b CKD Creatinine appears to be at baseline.  US  renal done and reviewed. Recommend outpatient non urgent MRI of the kidneys.    Hyperkalemia Lokelma  ordered  Repeat K wnl.    Dysphagia SLP eval done , plan for MBS in am.  Pt on dysphagia 3  diet with thin liquids.    Severe protein calorie malnutrition.  Encourage oral intake.     Chronic diastolic CHF He appears euvolemic.  Restart lasix  today.   Type 2 DM CBG (last 3)  Recent Labs    06/11/24 1511 06/11/24 2133 06/12/24 0758  GLUCAP 198* 139* 89   Resume SSI.    Hypertension Well controlled  Restart metoprolol .    BPH  On flomax .    Perineal wound Wound care consulted.    Anemia of chronic disease Hemoglobin appears to be stable.      Estimated body mass index is 19.5 kg/m as calculated from the following:   Height as of this encounter: 5' 6 (1.676 m).   Weight as of this encounter: 54.8 kg.  Code Status: full code.  DVT Prophylaxis:  heparin  injection 5,000 Units Start: 06/09/24 2200   Level of Care: Level of care: Progressive Family Communication: none at bedside.  Disposition Plan:     Remains inpatient appropriate: pending clinical improvement.   Procedures:  None.   Consultants:   SLP Wound care.   Antimicrobials:   Anti-infectives (From admission, onward)    Start  Dose/Rate Route Frequency Ordered Stop   06/10/24 2200  linezolid  (ZYVOX ) IVPB 600 mg        600 mg 300 mL/hr over 60 Minutes Intravenous Every 12 hours 06/10/24 1200     06/09/24 2102  vancomycin  variable dose per unstable renal function (pharmacist dosing)  Status:  Discontinued         Does not apply See admin instructions 06/09/24 2103 06/10/24 1200   06/09/24 2100  vancomycin  (VANCOREADY) IVPB 1250 mg/250 mL        1,250 mg 166.7 mL/hr over 90 Minutes Intravenous  Once 06/09/24 2057 06/10/24 1900   06/09/24 2045  ceFEPIme  (MAXIPIME ) 2 g in sodium chloride  0.9 % 100 mL IVPB        2 g 200 mL/hr over 30 Minutes Intravenous Every 24 hours 06/09/24 2040          Medications  Scheduled Meds:  arformoterol   15 mcg Nebulization BID   atorvastatin   40 mg Oral Daily   budesonide   0.5 mg Nebulization BID   doxazosin   2 mg Oral Daily   feeding  supplement  237 mL Oral TID BM   ferrous sulfate   325 mg Oral BID WC   furosemide   20 mg Oral Q M,W,F-2000   heparin   5,000 Units Subcutaneous Q8H   insulin  aspart  0-6 Units Subcutaneous TID WC   metoprolol  tartrate  12.5 mg Oral BID   multivitamin with minerals  1 tablet Oral Daily   pantoprazole   40 mg Oral Daily   revefenacin   175 mcg Nebulization Daily   sodium chloride  flush  3 mL Intravenous Q12H   tamsulosin   0.4 mg Oral Daily   Continuous Infusions:  ceFEPime  (MAXIPIME ) IV Stopped (06/11/24 2145)   linezolid  (ZYVOX ) IV Stopped (06/11/24 2300)   PRN Meds:.albuterol , docusate, haloperidol  lactate, ondansetron  (ZOFRAN ) IV    Subjective:   Clarence Maldonado was seen and examined today. Remains on 2 lit of [place] oxygen . Irritated that he did not get a goo dnight sleep.   Objective:   Vitals:   06/11/24 2157 06/11/24 2343 06/12/24 0413 06/12/24 0744  BP: 121/69 129/72  127/73  Pulse:  71 71 78  Resp:  17 18 18   Temp:  98.9 F (37.2 C) 97.9 F (36.6 C) 98 F (36.7 C)  TempSrc:  Oral Oral Axillary  SpO2:  100% 96% 100%  Weight:      Height:        Intake/Output Summary (Last 24 hours) at 06/12/2024 1012 Last data filed at 06/12/2024 0730 Gross per 24 hour  Intake 1680 ml  Output 800 ml  Net 880 ml   Filed Weights   06/09/24 2219 06/10/24 0459 06/11/24 0440  Weight: 58.6 kg 58.6 kg 54.8 kg     Exam General exam: Appears calm and comfortable  Respiratory system: Clear to auscultation. Respiratory effort normal. Cardiovascular system: S1 & S2 heard, RRR. No JVD,  Gastrointestinal system: Abdomen is nondistended, soft and nontender.  Central nervous system: Alert and oriented.  Extremities: right AKA, no leg edema on the left.  Skin: No rashes,  Psychiatry:  Mood & affect appropriate.    Data Reviewed:  I have personally reviewed following labs and imaging studies   CBC Lab Results  Component Value Date   WBC 7.0 06/11/2024   RBC 3.49 (L) 06/11/2024   HGB  9.4 (L) 06/11/2024   HCT 31.3 (L) 06/11/2024   MCV 89.7 06/11/2024   MCH 26.9 06/11/2024   PLT 138 (  L) 06/11/2024   MCHC 30.0 06/11/2024   RDW 17.9 (H) 06/11/2024   LYMPHSABS 0.8 06/11/2024   MONOABS 0.5 06/11/2024   EOSABS 0.2 06/11/2024   BASOSABS 0.0 06/11/2024     Last metabolic panel Lab Results  Component Value Date   NA 136 06/11/2024   K 4.7 06/11/2024   CL 98 06/11/2024   CO2 24 06/11/2024   BUN 54 (H) 06/11/2024   CREATININE 2.09 (H) 06/11/2024   GLUCOSE 187 (H) 06/11/2024   GFRNONAA 32 (L) 06/11/2024   GFRAA 54 (L) 06/13/2020   CALCIUM  8.6 (L) 06/11/2024   PHOS 4.0 06/10/2024   PROT 7.2 06/09/2024   ALBUMIN 2.4 (L) 06/09/2024   LABGLOB 2.8 01/08/2016   AGRATIO 1.6 01/08/2016   BILITOT 0.6 06/09/2024   ALKPHOS 112 06/09/2024   AST 51 (H) 06/09/2024   ALT 62 (H) 06/09/2024   ANIONGAP 14 06/11/2024    CBG (last 3)  Recent Labs    06/11/24 1511 06/11/24 2133 06/12/24 0758  GLUCAP 198* 139* 89      Coagulation Profile: No results for input(s): INR, PROTIME in the last 168 hours.   Radiology Studies: US  RENAL Result Date: 06/10/2024 CLINICAL DATA:  Acute kidney injury EXAM: RENAL / URINARY TRACT ULTRASOUND COMPLETE COMPARISON:  None Available. FINDINGS: Right Kidney: Length = 13.3 cm Normal parenchymal echogenicity with preserved corticomedullary differentiation. Exophytic cyst in the lower pole measures 7.5 x 7.1 x 7.0 cm with a possible thick internal septation along the inferior aspect. No urinary tract dilation or shadowing calculi. The ureter is not seen. Left Kidney: Length = 8.5 cm Diffusely increased cortical echogenicity with loss of corticomedullary differentiation which can be seen with medical renal disease. No urinary tract dilation or shadowing calculi. The ureter is not seen. Bladder: Layering, avascular hypoechogenicity within the urinary bladder. Other: None. IMPRESSION: 1. No urinary tract dilation or shadowing calculi. 2. Layering,  avascular hypoechogenicity within the urinary bladder, which may represent debris or blood products. Recommend correlation with urinalysis. 3. Exophytic cyst in the lower pole of the right kidney measures 7.5 x 7.1 x 7.0 cm with a possible thick internal septation along the inferior aspect. Recommend further evaluation with nonemergent contrast-enhanced renal protocol MRI or CT. 4. Diffusely increased cortical echogenicity of the atrophic left kidney with loss of corticomedullary differentiation which can be seen with medical renal disease. Electronically Signed   By: Limin  Xu M.D.   On: 06/10/2024 11:11       Elgie Butter M.D. [facility] 06/12/2024, 10:12 AM  Available via Epic secure chat 7am-7pm After 7 pm, please refer to night coverage provider listed on amion.

## 2024-06-12 NOTE — Progress Notes (Signed)
 Speech Language Pathology Treatment: Dysphagia  Patient Details Name: Clarence Maldonado MRN: [id number] DOB: [birth year] Today's Date: 06/12/2024 Time: 0957-1010 SLP Time Calculation (min) (ACUTE ONLY): 13 min  Assessment / Plan / Recommendation Clinical Impression  Session was limited as pt frustrated/irritated by not being able to urinate, concerned about location of belongings (RN notified) and stated he just finished breakfast and needed encouragement to participate. Breakfast tray was clean and stated he did not have difficulty. He was not congested or coughing prior to po's. Observed with graham cracker which he masticated timely without residue and thin liquid via straw without s/s aspiration although limited po's consumed. Given history of COPD, second admission with pna (readmitted same day of discharge) recommend MBS to evaluate pharyngeal function of swallow. He may continue Dys 3/thin liquids until MBS which will be tomorrow.    HPI HPI: Patient is a 78 y.o. male with PMH: COPD, CDK 3b, DMT2, HTN, HLD, BPH, R AKA, anxiety/depression. He was admitted from 8/8-06/09/24 with worsening SOB. He presented back to the hospital from SNF on day of discharge (9/5) with respiratory distress; when EMS arrived they found him to be saturating in the 60%'s on 4L oxygen  at his facility. He was placed on CPAP, given two DuoNeb treatments and 125 mg of Solu-Medrol . DG chest portable reported Similar multifocal bilateral patchy opacities, in keeping with known pneumonia and sequela of prior infection/inflammation. SLP ordered to assess patient's swallow function.      SLP Plan  Continue with current plan of care          Recommendations  Diet recommendations: Dysphagia 3 (mechanical soft);Thin liquid Liquids provided via: Straw Medication Administration: Other (Comment) (as tolerated) Supervision: Intermittent supervision to cue for compensatory strategies Compensations: Slow rate;Small  sips/bites Postural Changes and/or Swallow Maneuvers: Seated upright 90 degrees                  Oral care BID   Intermittent Supervision/Assistance Dysphagia, unspecified (R13.10)     Continue with current plan of care     Dustin Olam Bull  06/12/2024, 10:21 AM

## 2024-06-12 NOTE — TOC Progression Note (Addendum)
 Transition of Care [facility]) - Progression Note    Patient Details  Name: Clarence Maldonado MRN: [id number] Date of Birth: 03-31-1946  Transition of [facility]) CM/SW Contact  Isaiah Public, LCSWA Phone Number: 06/12/2024, 9:52 AM  Clinical Narrative:     CSW requested for Jon CMA to start insurance authorization for patient.  Angela CMA started insurance authorization for patient. Patients insurance authorization is currently pending [name] PI#[id number]. CSW will continue to follow.  Update- Jon CMA informed CSW that patients insurance authorization has been approved. Insurance authorization approved from  9/5 - 9/9 Auth PI#[id number] Plan Auth PI#[id number].   Expected Discharge Plan: Skilled Nursing Facility Barriers to Discharge: Continued Medical Work up, English as a second language teacher               Expected Discharge Plan and Services In-house Referral: Clinical Social Work   Post Acute Care Choice: NA Living arrangements for the past 2 months: Skilled Nursing Facility                                       Social Drivers of Health (SDOH) Interventions SDOH Screenings   Food Insecurity: No Food Insecurity (06/10/2024)  Housing: Low Risk  (06/10/2024)  Transportation Needs: No Transportation Needs (06/10/2024)  Utilities: Not At Risk (06/10/2024)  Depression (PHQ2-9): Low Risk  (10/14/2023)  Financial Resource Strain: Low Risk  (03/23/2019)  Physical Activity: Inactive (03/23/2019)  Social Connections: Socially Isolated (06/10/2024)  Stress: No Stress Concern Present (03/23/2019)  Tobacco Use: Medium Risk (06/09/2024)    Readmission Risk Interventions    06/08/2024    3:02 PM  Readmission Risk Prevention Plan  Transportation Screening Complete  Medication Review (RN Care Manager) Complete  PCP or Specialist appointment within 3-5 days of discharge Complete  HRI or Home Care Consult Complete  SW Recovery Care/Counseling Consult Complete  Palliative Care Screening Not  Applicable  Skilled Nursing Facility Complete

## 2024-06-13 ENCOUNTER — Inpatient Hospital Stay (HOSPITAL_COMMUNITY)

## 2024-06-13 DIAGNOSIS — I10 Essential (primary) hypertension: Secondary | ICD-10-CM | POA: Diagnosis not present

## 2024-06-13 DIAGNOSIS — N1832 Chronic kidney disease, stage 3b: Secondary | ICD-10-CM | POA: Diagnosis not present

## 2024-06-13 DIAGNOSIS — J9602 Acute respiratory failure with hypercapnia: Secondary | ICD-10-CM | POA: Diagnosis not present

## 2024-06-13 DIAGNOSIS — J9601 Acute respiratory failure with hypoxia: Secondary | ICD-10-CM | POA: Diagnosis not present

## 2024-06-13 LAB — BASIC METABOLIC PANEL WITH GFR
Anion gap: 10 (ref 5–15)
BUN: 46 mg/dL — ABNORMAL HIGH (ref 8–23)
CO2: 29 mmol/L (ref 22–32)
Calcium: 8.9 mg/dL (ref 8.9–10.3)
Chloride: 100 mmol/L (ref 98–111)
Creatinine, Ser: 2.15 mg/dL — ABNORMAL HIGH (ref 0.61–1.24)
GFR, Estimated: 31 mL/min — ABNORMAL LOW (ref >60)
Glucose, Bld: 120 mg/dL — ABNORMAL HIGH (ref 70–99)
Potassium: 4.7 mmol/L (ref 3.5–5.1)
Sodium: 139 mmol/L (ref 135–145)

## 2024-06-13 LAB — GLUCOSE, CAPILLARY
Glucose-Capillary: 112 mg/dL — ABNORMAL HIGH (ref 70–99)
Glucose-Capillary: 150 mg/dL — ABNORMAL HIGH (ref 70–99)
Glucose-Capillary: 186 mg/dL — ABNORMAL HIGH (ref 70–99)
Glucose-Capillary: 143 mg/dL — ABNORMAL HIGH (ref 70–99)

## 2024-06-13 LAB — BRAIN NATRIURETIC PEPTIDE: B Natriuretic Peptide: 65.5 pg/mL (ref 0.0–100.0)

## 2024-06-13 NOTE — Progress Notes (Signed)
 PT Cancellation Note  Patient Details Name: Clarence Maldonado MRN: [id number] DOB: [birth year]   Cancelled Treatment:    Reason Eval/Treat Not Completed: Patient declined, no reason specified after initially agreeing to physical therapy session after he finished his lunch, the pt became agitated with suggestion of therapy after eating, stating he was too full to mobilize. Despite attempts to meet pt needs and come at a time he preferred in the future, pt became increasingly frustrated with therapist questions. Will continue to follow and attempt to progress mobility as tolerated.   Izetta Call, PT, DPT   Acute Rehabilitation Department Office [phone] Secure Chat Communication Preferred   Izetta JULIANNA Call 06/13/2024, 2:09 PM

## 2024-06-13 NOTE — Progress Notes (Signed)
 [facility]                                                                               Taimur Fier, is a 78 y.o. male, DOB - [birth year], FMW:[id number] Admit date - 06/09/2024    Outpatient Primary MD for the patient is Gil Greig BRAVO, NP  LOS - 4  days    Brief summary   Clarence Maldonado is a 78 y.o. male with hx of Dementia, CVA, dysphagia, CAD, hx silent Mis, HFimpEF, CKD3b, HTN, HLD, DM type 2, COPD, BPH, R AKA, anxiety / depression, prior smoking, prior pulmonary TB, recent complex hospitalization from 8/8 - 9/5 for respiratory failure requiring intubation in the setting of human metapneumovirus and pseudomonas pneumonia, septic shock in setting of candida glabrata fungemia and perineal abscess requiring vasopressor, and additional problems including AKI, hyperkalemia, urinary retention, and dysphagia; he was brought back in on same day of discharge due to acute hypoxic respiratory failure requiring NIV.    Assessment & Plan    Assessment and Plan:  Acute hypoxic and hypercapneic respiratory failure differential include aspiration pneumonia vs panic attack vs residual pneumonia.  CXR on admission shows Similar multifocal bilateral patchy opacities, in keeping with known pneumonia and sequela of prior infection/inflammation. Currently on 2 lit of [place] oxygen .  Blood cultures done and pending. Negative so far. SLP eval , plan MBS in am.  Resume IV cefepime  and linezolid  to complete a 5 day course.    Acute metabolic encephalopathy Appears to have improved.  He is alert and conversing well.     Stage 3b CKD Creatinine appears to be at baseline.  US  renal done and reviewed. Recommend outpatient non urgent MRI of the kidneys.    Hyperkalemia Lokelma  ordered  Repeat K wnl.    Dysphagia SLP eval done , plan for MBS in am.  Pt on dysphagia 3 diet with thin liquids.    Severe protein calorie malnutrition.  Encourage oral intake.     Chronic diastolic  CHF He appears euvolemic.  Resume lasix .    Type 2 Diabetes mellitus:  CBG (last 3)  Recent Labs    06/12/24 1634 06/12/24 2129 06/13/24 0734  GLUCAP 112* 136* 112*   Resume SSI.    Hypertension Well controlled  Restart metoprolol .    BPH  On flomax .    Perineal wound Wound care consulted.    Anemia of chronic disease Hemoglobin appears to be stable.      Estimated body mass index is 20.55 kg/m as calculated from the following:   Height as of this encounter: 5' 6 (1.676 m).   Weight as of this encounter: 57.7 kg.  Code Status: full code.  DVT Prophylaxis:  heparin  injection 5,000 Units Start: 06/09/24 2200   Level of Care: Level of care: Progressive Family Communication: none at bedside.   Disposition Plan:     Remains inpatient appropriate: discharge in the next 24 to 48 hours to SNF.   Procedures:  None.   Consultants:   SLP Wound care.   Antimicrobials:   Anti-infectives (From admission, onward)    Start     Dose/Rate Route Frequency Ordered Stop  06/12/24 1130  linezolid  (ZYVOX ) tablet 600 mg        600 mg Oral Every 12 hours 06/12/24 1041 06/16/24 0959   06/10/24 2200  linezolid  (ZYVOX ) IVPB 600 mg  Status:  Discontinued        600 mg 300 mL/hr over 60 Minutes Intravenous Every 12 hours 06/10/24 1200 06/12/24 1041   06/09/24 2102  vancomycin  variable dose per unstable renal function (pharmacist dosing)  Status:  Discontinued         Does not apply See admin instructions 06/09/24 2103 06/10/24 1200   06/09/24 2100  vancomycin  (VANCOREADY) IVPB 1250 mg/250 mL        1,250 mg 166.7 mL/hr over 90 Minutes Intravenous  Once 06/09/24 2057 06/10/24 1900   06/09/24 2045  ceFEPIme  (MAXIPIME ) 2 g in sodium chloride  0.9 % 100 mL IVPB        2 g 200 mL/hr over 30 Minutes Intravenous Every 24 hours 06/09/24 2040 06/16/24 1047        Medications  Scheduled Meds:  arformoterol   15 mcg Nebulization BID   atorvastatin   40 mg Oral Daily    budesonide   0.5 mg Nebulization BID   Chlorhexidine  Gluconate Cloth  6 each Topical Daily   doxazosin   2 mg Oral Daily   feeding supplement  237 mL Oral TID BM   ferrous sulfate   325 mg Oral BID WC   furosemide   20 mg Oral Q M,W,F   heparin   5,000 Units Subcutaneous Q8H   insulin  aspart  0-6 Units Subcutaneous TID WC   linezolid   600 mg Oral Q12H   metoprolol  tartrate  12.5 mg Oral BID   multivitamin with minerals  1 tablet Oral Daily   mupirocin  ointment  1 Application Nasal BID   pantoprazole   40 mg Oral Daily   QUEtiapine   25 mg Oral QHS   revefenacin   175 mcg Nebulization Daily   sodium chloride  flush  3 mL Intravenous Q12H   tamsulosin   0.4 mg Oral Daily   Continuous Infusions:  ceFEPime  (MAXIPIME ) IV 2 g (06/12/24 2125)   PRN Meds:.albuterol , docusate, haloperidol  lactate, ondansetron  (ZOFRAN ) IV    Subjective:   Clarence Maldonado was seen and examined today.no new complaints today.   Objective:   Vitals:   06/13/24 0500 06/13/24 0724 06/13/24 0735 06/13/24 1140  BP:   (!) 118/57 115/63  Pulse:   83 72  Resp:   17 20  Temp:   98.5 F (36.9 C) 98.7 F (37.1 C)  TempSrc:   Axillary Oral  SpO2:  96% 95% 95%  Weight: 57.7 kg     Height:        Intake/Output Summary (Last 24 hours) at 06/13/2024 1232 Last data filed at 06/13/2024 0800 Gross per 24 hour  Intake 760 ml  Output 420 ml  Net 340 ml   Filed Weights   06/10/24 0459 06/11/24 0440 06/13/24 0500  Weight: 58.6 kg 54.8 kg 57.7 kg     Exam General exam: Appears calm and comfortable  Respiratory system: Clear to auscultation. Respiratory effort normal. Cardiovascular system: S1 & S2 heard, RRR. No JVD, Gastrointestinal system: Abdomen is nondistended, soft and nontender.  Central nervous system: Alert and oriented.  Extremities: s/p right AKA. No swelling on the left leg.  Skin: No rashes,  Psychiatry: Mood & affect appropriate.     Data Reviewed:  I have personally reviewed following labs and  imaging studies   CBC Lab Results  Component Value Date  WBC 7.0 06/11/2024   RBC 3.49 (L) 06/11/2024   HGB 9.4 (L) 06/11/2024   HCT 31.3 (L) 06/11/2024   MCV 89.7 06/11/2024   MCH 26.9 06/11/2024   PLT 138 (L) 06/11/2024   MCHC 30.0 06/11/2024   RDW 17.9 (H) 06/11/2024   LYMPHSABS 0.8 06/11/2024   MONOABS 0.5 06/11/2024   EOSABS 0.2 06/11/2024   BASOSABS 0.0 06/11/2024     Last metabolic panel Lab Results  Component Value Date   NA 139 06/13/2024   K 4.7 06/13/2024   CL 100 06/13/2024   CO2 29 06/13/2024   BUN 46 (H) 06/13/2024   CREATININE 2.15 (H) 06/13/2024   GLUCOSE 120 (H) 06/13/2024   GFRNONAA 31 (L) 06/13/2024   GFRAA 54 (L) 06/13/2020   CALCIUM  8.9 06/13/2024   PHOS 4.0 06/10/2024   PROT 7.2 06/09/2024   ALBUMIN 2.4 (L) 06/09/2024   LABGLOB 2.8 01/08/2016   AGRATIO 1.6 01/08/2016   BILITOT 0.6 06/09/2024   ALKPHOS 112 06/09/2024   AST 51 (H) 06/09/2024   ALT 62 (H) 06/09/2024   ANIONGAP 10 06/13/2024    CBG (last 3)  Recent Labs    06/12/24 1634 06/12/24 2129 06/13/24 0734  GLUCAP 112* 136* 112*      Coagulation Profile: No results for input(s): INR, PROTIME in the last 168 hours.   Radiology Studies: No results found.      Elgie Butter M.D. [facility] 06/13/2024, 12:32 PM  Available via Epic secure chat 7am-7pm After 7 pm, please refer to night coverage provider listed on amion.

## 2024-06-13 NOTE — Plan of Care (Signed)

## 2024-06-13 NOTE — Care Management Important Message (Signed)
 Important Message  Patient Details  Name: Clarence Maldonado MRN: [id number] Date of Birth: [birth year]   Important Message Given:  Yes - Medicare IM     Clarence Maldonado 06/13/2024, 10:07 AM

## 2024-06-13 NOTE — TOC Progression Note (Addendum)
 Transition of Care [facility]) - Progression Note    Patient Details  Name: Clarence Maldonado MRN: [id number] Date of Birth: Feb 06, 1946  Transition of Care [facility]) CM/SW Contact  Isaiah Public, LCSWA Phone Number: 06/13/2024, 10:43 AM  Clinical Narrative:     Facility informed CSW that previous insurance auth that was approved was old [place]. CSW informed CMA Jon. Facility requested that auth be restarted. CSW restarted insurance authorization for patient. Auth ID# [id number]. Patients insurance authorization currently pending.CSW informed MD.  Facility informed CSW that patient does not have LTC medicaid need insurance authorization approval to return to facility.  Expected Discharge Plan: Skilled Nursing Facility Barriers to Discharge: Continued Medical Work up, English as a second language teacher               Expected Discharge Plan and Services In-house Referral: Clinical Social Work   Post Acute Care Choice: NA Living arrangements for the past 2 months: Skilled Nursing Facility                                       Social Drivers of Health (SDOH) Interventions SDOH Screenings   Food Insecurity: No Food Insecurity (06/10/2024)  Housing: Low Risk  (06/10/2024)  Transportation Needs: No Transportation Needs (06/10/2024)  Utilities: Not At Risk (06/10/2024)  Depression (PHQ2-9): Low Risk  (10/14/2023)  Financial Resource Strain: Low Risk  (03/23/2019)  Physical Activity: Inactive (03/23/2019)  Social Connections: Socially Isolated (06/10/2024)  Stress: No Stress Concern Present (03/23/2019)  Tobacco Use: Medium Risk (06/09/2024)    Readmission Risk Interventions    06/08/2024    3:02 PM  Readmission Risk Prevention Plan  Transportation Screening Complete  Medication Review (RN Care Manager) Complete  PCP or Specialist appointment within 3-5 days of discharge Complete  HRI or Home Care Consult Complete  SW Recovery Care/Counseling Consult Complete  Palliative Care Screening Not Applicable   Skilled Nursing Facility Complete

## 2024-06-13 NOTE — Procedures (Signed)
 Modified Barium Swallow Study  Patient Details  Name: Clarence Maldonado MRN: [id number] Date of Birth: 1946-03-01  Today's Date: 06/13/2024  Modified Barium Swallow completed.  Full report located under Chart Review in the Imaging Section.  History of Present Illness Patient is a 78 y.o. male with PMH: COPD, CDK 3b, DMT2, HTN, HLD, BPH, R AKA, anxiety/depression. He was admitted from 8/8-06/09/24 with worsening SOB. He presented back to the hospital from [facility] on day of discharge (9/5) with respiratory distress; when EMS arrived they found him to be saturating in the 60%'s on 4L oxygen  at his facility. He was placed on CPAP, given two DuoNeb treatments and 125 mg of Solu-Medrol . DG chest portable reported Similar multifocal bilateral patchy opacities, in keeping with known pneumonia and sequela of prior infection/inflammation. SLP ordered to assess patient's swallow function.   Clinical Impression Patient presents with oropharyngeal dysphagia. Penetration was noted throughout the MBS with thin liquids. Penetration above the vocal folds that was fully ejected from the airway (PAS 2). No penetration or aspiration with nectar or honey thick liquids. Patient had slow prolonged bolus preparation as well as posterior escape of less than half of bolus. Delayed initiation of tongue motion was noted throughout the evaluation. Partial inversion of epiglottic movement was noted as well as partial superior movment of thyroid  cartilage. A collection of residue was noted lining patient's valleculae after thin, mildly thick, and moderately thick liquids. A barium tablet was administered and patient required a puree to clear the tablet from oral cavity. Tablet then transitted through pharynx and esophagus without difficulty. SLP recommending continue with Dys 3 (mechanical soft) diet and thin liquids. SLP to consider downgrading to nectar thick liquids if coughing with PO's persist. Factors that may increase risk of adverse  event in presence of aspiration Noe & Lianne 2021): Frail or deconditioned;Reduced cognitive function;Poor general health and/or compromised immunity;Respiratory or GI disease  DIGEST Swallow Severity Rating*  Safety: 1  Efficiency:1  Overall Pharyngeal Swallow Severity: 1 (mild) 1: mild; 2: moderate; 3: severe; 4: profound  *The Dynamic Imaging Grade of Swallowing Toxicity is standardized for the head and neck cancer population, however, demonstrates promising clinical applications across populations to standardize the clinical rating of pharyngeal swallow safety and severity.   Swallow Evaluation Recommendations Recommendations: PO diet PO Diet Recommendation: Dysphagia 3 (Mechanical soft);Thin liquids (Level 0) Liquid Administration via: Cup;Straw;Spoon Medication Administration: Whole meds with puree Supervision: Patient able to self-feed Swallowing strategies  : Minimize environmental distractions;Slow rate;Small bites/sips Postural changes: Stay upright 30-60 min after meals;Position pt fully upright for meals Oral care recommendations: Oral care BID (2x/day)     Hajira Verhagen  Graduate SLP [employer]

## 2024-06-13 NOTE — Progress Notes (Signed)

## 2024-06-14 DIAGNOSIS — J9601 Acute respiratory failure with hypoxia: Secondary | ICD-10-CM | POA: Diagnosis not present

## 2024-06-14 DIAGNOSIS — J9602 Acute respiratory failure with hypercapnia: Secondary | ICD-10-CM | POA: Diagnosis not present

## 2024-06-14 LAB — GLUCOSE, CAPILLARY
Glucose-Capillary: 114 mg/dL — ABNORMAL HIGH (ref 70–99)
Glucose-Capillary: 215 mg/dL — ABNORMAL HIGH (ref 70–99)
Glucose-Capillary: 139 mg/dL — ABNORMAL HIGH (ref 70–99)
Glucose-Capillary: 198 mg/dL — ABNORMAL HIGH (ref 70–99)

## 2024-06-14 LAB — CULTURE, BLOOD (ROUTINE X 2)
Culture: NO GROWTH
Culture: NO GROWTH
Special Requests: ADEQUATE
Special Requests: ADEQUATE

## 2024-06-14 LAB — PROCALCITONIN: Procalcitonin: 0.19 ng/mL

## 2024-06-14 NOTE — Progress Notes (Signed)
 Patient is visibly Clarence Maldonado. His heart rate is 120-130s. He then complained of chest pain on left side that shortly went away without medication. EKG done. Howerter MD notified. Please see new orders.

## 2024-06-14 NOTE — Progress Notes (Signed)
 PROGRESS NOTE    IMRAAN WENDELL  FMW:[id number] DOB: 1946/01/31 DOA: 06/09/2024 PCP: Gil Greig BRAVO, NP   Brief Narrative:  DAMONDRE PFEIFLE is a 78 y.o. male with hx of Dementia, CVA, dysphagia, CAD, hx silent Mis, HFimpEF, CKD3b, HTN, HLD, DM type 2, COPD, BPH, R AKA, anxiety / depression, prior smoking, prior pulmonary TB, recent complex hospitalization from 8/8 - 9/5 for respiratory failure requiring intubation in the setting of human metapneumovirus and pseudomonas pneumonia, septic shock in setting of candida glabrata fungemia and perineal abscess requiring vasopressor, and additional problems including AKI, hyperkalemia, urinary retention, and dysphagia; he was brought back in on same day of discharge due to acute hypoxic respiratory failure requiring NIV.   Assessment & Plan:   Principal Problem:   Acute respiratory failure with hypoxia and hypercapnia (HCC)  Acute hypoxic and hypercapneic respiratory failure differential include aspiration pneumonia vs panic attack vs residual pneumonia.  CXR on admission shows Similar multifocal bilateral patchy opacities, in keeping with known pneumonia and sequela of prior infection/inflammation. Currently on room air.  Although he does complain of shortness of breath but his history is very unreliable, at times he says this is chronic problem for him.  Lungs are clear to auscultation.  He was not hypoxic when we checked his oxygen  saturation while I was in the room.  Appears to be having a lot of anxiety.  Blood culture negative.  Continue IV cefepime  and linezolid  to complete a 5 day course.    Acute metabolic encephalopathy Appears to have improved.  He is alert and conversing well.    Stage 3b CKD Creatinine appears to be at baseline.  US  renal done and reviewed. Recommend outpatient non urgent MRI of the kidneys.    Hyperkalemia Resolved   Dysphagia SLP eval done , status post MBS, on dysphagia 3 diet.  SLP following.   Severe protein  calorie malnutrition.  Encourage oral intake.    Chronic diastolic CHF He appears euvolemic.  Continue Lasix .   Type 2 Diabetes mellitus: On SSI, blood sugar labile.   Hypertension: Blood pressure low at times.  Patient on doxazosin , Flomax , blood pressure and Lasix .   BPH  On flomax  and Cardura .    Perineal wound Wound care consulted.    Anemia of chronic disease Hemoglobin appears to be stable.   DVT prophylaxis: heparin  injection 5,000 Units Start: 06/09/24 2200   Code Status: Full Code  Family Communication:  None present at bedside.   Status is: Inpatient Remains inpatient appropriate because: Medically stable, pending placement.   Estimated body mass index is 19.85 kg/m as calculated from the following:   Height as of this encounter: 5' 6 (1.676 m).   Weight as of this encounter: 55.8 kg.    Nutritional Assessment: Body mass index is 19.85 kg/m.SABRA Seen by dietician.  I agree with the assessment and plan as outlined below: Nutrition Status:        . Skin Assessment: I have examined the patient's skin and I agree with the wound assessment as performed by the wound care RN as outlined below:    Consultants:  None  Procedures:  None  Antimicrobials:  Anti-infectives (From admission, onward)    Start     Dose/Rate Route Frequency Ordered Stop   06/12/24 1130  linezolid  (ZYVOX ) tablet 600 mg        600 mg Oral Every 12 hours 06/12/24 1041 06/16/24 0959   06/10/24 2200  linezolid  (ZYVOX ) IVPB 600 mg  Status:  Discontinued        600 mg 300 mL/hr over 60 Minutes Intravenous Every 12 hours 06/10/24 1200 06/12/24 1041   06/09/24 2102  vancomycin  variable dose per unstable renal function (pharmacist dosing)  Status:  Discontinued         Does not apply See admin instructions 06/09/24 2103 06/10/24 1200   06/09/24 2100  vancomycin  (VANCOREADY) IVPB 1250 mg/250 mL        1,250 mg 166.7 mL/hr over 90 Minutes Intravenous  Once 06/09/24 2057 06/10/24 1900    06/09/24 2045  ceFEPIme  (MAXIPIME ) 2 g in sodium chloride  0.9 % 100 mL IVPB        2 g 200 mL/hr over 30 Minutes Intravenous Every 24 hours 06/09/24 2040 06/16/24 1047         Subjective: Patient seen and examined, very hard of hearing.  Nurse at the bedside.  Patient was complaining of shortness of breath however he verbalized that this is chronic problem for him.  No other complaint.  Appeared very anxious.  Objective: Vitals:   06/14/24 0500 06/14/24 0750 06/14/24 0844 06/14/24 1141  BP:  106/66 106/66 (!) 98/57  Pulse:  (!) 107 (!) 107 80  Resp:  17  19  Temp:  97.9 F (36.6 C)  98.8 F (37.1 C)  TempSrc:  Oral  Oral  SpO2:  95%  91%  Weight: 55.8 kg     Height:        Intake/Output Summary (Last 24 hours) at 06/14/2024 1321 Last data filed at 06/14/2024 1145 Gross per 24 hour  Intake 105.46 ml  Output 1100 ml  Net -994.54 ml   Filed Weights   06/11/24 0440 06/13/24 0500 06/14/24 0500  Weight: 54.8 kg 57.7 kg 55.8 kg    Examination:  General exam: Appears anxious. Respiratory system: Clear to auscultation. Respiratory effort normal. Cardiovascular system: S1 & S2 heard, RRR. No JVD, murmurs, rubs, gallops or clicks. No pedal edema. Gastrointestinal system: Abdomen is nondistended, soft and nontender. No organomegaly or masses felt. Normal bowel sounds heard. Central nervous system: Alert and oriented x 2. No focal neurological deficits. Extremities: Symmetric 5 x 5 power. Skin: No rashes, lesions or ulcers Psychiatry: Judgement and insight appear poor  Data Reviewed: I have personally reviewed following labs and imaging studies  CBC: Recent Labs  Lab 06/08/24 0501 06/09/24 0527 06/09/24 1753 06/09/24 1808 06/09/24 1828 06/10/24 0439 06/11/24 0338  WBC 5.2 5.3 5.9  --   --  5.1 7.0  NEUTROABS 3.3 3.5 3.4  --   --   --  5.3  HGB 10.8* 10.1* 11.7* 12.9* 10.9* 9.5* 9.4*  HCT 35.5* 33.2* 40.4 38.0* 32.0* 32.0* 31.3*  MCV 89.0 88.1 93.3  --   --  89.4 89.7   PLT 176 139* 134*  --   --  130* 138*   Basic Metabolic Panel: Recent Labs  Lab 06/08/24 0501 06/09/24 0527 06/09/24 1753 06/09/24 1808 06/09/24 1828 06/10/24 0439 06/10/24 2144 06/11/24 0338 06/12/24 1106 06/13/24 1019  NA 140 138 137 134* 136 137  --  136 136 139  K 5.0 4.5 5.6* 6.8* 5.4* 5.2* 5.1 4.7 4.5 4.7  CL 97* 96* 97* 101  --  99  --  98 98 100  CO2 29 32 29  --   --  26  --  24 28 29   GLUCOSE 110* 166* 137* 142*  --  207*  --  187* 279* 120*  BUN 59* 57*  62* 90*  --  60*  --  54* 44* 46*  CREATININE 2.27* 2.24* 2.28* 2.40*  --  2.14*  --  2.09* 2.54* 2.15*  CALCIUM  8.9 8.8* 8.8*  --   --  8.3*  --  8.6* 8.4* 8.9  MG 1.9 1.9  --   --   --  1.9  --   --   --   --   PHOS  --   --   --   --   --  4.0  --   --   --   --    GFR: Estimated Creatinine Clearance: 22.7 mL/min (A) (by C-G formula based on SCr of 2.15 mg/dL (H)). Liver Function Tests: Recent Labs  Lab 06/09/24 1753  AST 51*  ALT 62*  ALKPHOS 112  BILITOT 0.6  PROT 7.2  ALBUMIN 2.4*   No results for input(s): LIPASE, AMYLASE in the last 168 hours. No results for input(s): AMMONIA in the last 168 hours. Coagulation Profile: No results for input(s): INR, PROTIME in the last 168 hours. Cardiac Enzymes: No results for input(s): CKTOTAL, CKMB, CKMBINDEX, TROPONINI in the last 168 hours. BNP (last 3 results) No results for input(s): PROBNP in the last 8760 hours. HbA1C: No results for input(s): HGBA1C in the last 72 hours. CBG: Recent Labs  Lab 06/13/24 1300 06/13/24 1658 06/13/24 2118 06/14/24 0749 06/14/24 1143  GLUCAP 150* 186* 143* 114* 215*   Lipid Profile: No results for input(s): CHOL, HDL, LDLCALC, TRIG, CHOLHDL, LDLDIRECT in the last 72 hours. Thyroid  Function Tests: No results for input(s): TSH, T4TOTAL, FREET4, T3FREE, THYROIDAB in the last 72 hours. Anemia Panel: No results for input(s): VITAMINB12, FOLATE, FERRITIN, TIBC, IRON ,  RETICCTPCT in the last 72 hours. Sepsis Labs: Recent Labs  Lab 06/09/24 1809 06/09/24 2104 06/10/24 0439 06/14/24 0404  PROCALCITON  --   --   --  0.19  LATICACIDVEN 2.4* 3.2* 1.9  --     Recent Results (from the past 240 hours)  Culture, blood (Routine X 2) w Reflex to ID Panel     Status: None   Collection Time: 06/09/24  9:04 PM   Specimen: BLOOD  Result Value Ref Range Status   Specimen Description BLOOD BLOOD LEFT ARM  Final   Special Requests   Final    BOTTLES DRAWN AEROBIC AND ANAEROBIC Blood Culture adequate volume   Culture   Final    NO GROWTH 5 DAYS Performed at [facility], [address], [place], [place] [address]    Report Status 06/14/2024 FINAL  Final  Culture, blood (Routine X 2) w Reflex to ID Panel     Status: None   Collection Time: 06/09/24  9:04 PM   Specimen: BLOOD  Result Value Ref Range Status   Specimen Description BLOOD BLOOD RIGHT HAND  Final   Special Requests   Final    BOTTLES DRAWN AEROBIC ONLY Blood Culture adequate volume   Culture   Final    NO GROWTH 5 DAYS Performed at [facility], [address] [place] [address] [place], [place] [address]    Report Status 06/14/2024 FINAL  Final  MRSA Next Gen by PCR, Nasal     Status: Abnormal   Collection Time: 06/12/24 10:40 AM   Specimen: Nasal Mucosa; Nasal Swab  Result Value Ref Range Status   MRSA by PCR Next Gen DETECTED (A) NOT DETECTED Final    Comment: RESULT CALLED TO, READ BACK BY AND VERIFIED WITH: RN  WADDELL DEL ON [id number] @1600  BY SM (NOTE) The GeneXpert MRSA Assay (FDA approved for NASAL specimens only), is one component of a comprehensive MRSA colonization surveillance program. It is not intended to diagnose MRSA infection nor to guide or monitor treatment for MRSA infections. Test performance is not FDA approved in patients less than 45 years old. Performed at [facility], [address], [place], [place] [address]      Radiology Studies: DG Chest Port 1 View Result  Date: 06/13/2024 CLINICAL DATA:  Shortness of breath EXAM: PORTABLE CHEST 1 VIEW COMPARISON:  06/09/2024 FINDINGS: 2 frontal views of the chest demonstrate a stable cardiac silhouette. Chronic areas of scarring are again seen throughout the lungs, with no new airspace disease, effusion, or pneumothorax. No acute bony abnormalities. IMPRESSION: 1. Stable pulmonary scarring.  No acute airspace disease. Electronically Signed   By: Ozell Daring M.D.   On: 06/13/2024 22:15   DG Swallowing Func-Speech Pathology Result Date: 06/13/2024 Table formatting from the original result was not included. Modified Barium Swallow Study Patient Details Name: MANUEL DALL MRN: [id number] Date of Birth: September 10, 1946 Today's Date: 06/13/2024 HPI/PMH: HPI: Patient is a 78 y.o. male with PMH: COPD, CDK 3b, DMT2, HTN, HLD, BPH, R AKA, anxiety/depression. He was admitted from 8/8-06/09/24 with worsening SOB. He presented back to the hospital from SNF on day of discharge (9/5) with respiratory distress; when EMS arrived they found him to be saturating in the 60%'s on 4L oxygen  at his facility. He was placed on CPAP, given two DuoNeb treatments and 125 mg of Solu-Medrol . DG chest portable reported Similar multifocal bilateral patchy opacities, in keeping with known pneumonia and sequela of prior infection/inflammation. SLP ordered to assess patient's swallow function. Clinical Impression: Clinical Impression: Patient presents with oropharyngeal dysphagia. Penetration was noted throughout the MBS with thin liquids. Penetration above the vocal folds that was fully ejected from the airway (PAS 2). No penetration or aspiration with nectar or honey thick liquids. Patient had slow prolonged bolus preparation as well as posterior escape of less than half of bolus. Delayed initiation of tongue motion was noted throughout the evaluation. Partial inversion of epiglottic movement was noted as well as partial superior movment of thyroid  cartilage. A  collection of residue was noted lining patient's valleculae after thin, mildly thick, and moderately thick liquids. A barium tablet was administered and patient required a puree to clear the tablet from oral cavity. Tablet then transitted through pharynx and esophagus without difficulty. SLP recommending continue with Dys 3 (mechanical soft) diet and thin liquids. SLP to consider downgrading to nectar thick liquids if coughing with PO's persist. Factors that may increase risk of adverse event in presence of aspiration Noe & Lianne 2021): Factors that may increase risk of adverse event in presence of aspiration Noe & Lianne 2021): Frail or deconditioned; Reduced cognitive function; Poor general health and/or compromised immunity; Respiratory or GI disease Recommendations/Plan: Swallowing Evaluation Recommendations Swallowing Evaluation Recommendations Recommendations: PO diet PO Diet Recommendation: Dysphagia 3 (Mechanical soft); Thin liquids (Level 0) Liquid Administration via: Cup; Straw; Spoon Medication Administration: Whole meds with puree Supervision: Patient able to self-feed Swallowing strategies  : Minimize environmental distractions; Slow rate; Small bites/sips Postural changes: Stay upright 30-60 min after meals; Position pt fully upright for meals Oral care recommendations: Oral care BID (2x/day) Treatment Plan Treatment Plan Treatment recommendations: Therapy as outlined in treatment plan below Functional status assessment: Patient has had a recent decline in their functional status and demonstrates the ability  to make significant improvements in function in a reasonable and predictable amount of time. Recommendations Recommendations for follow up therapy are one component of a multi-disciplinary discharge planning process, led by the attending physician.  Recommendations may be updated based on patient status, additional functional criteria and insurance authorization. Assessment: Orofacial  Exam: Orofacial Exam Oral Cavity - Dentition: Edentulous Anatomy: Anatomy: WFL Boluses Administered: Boluses Administered Boluses Administered: Thin liquids (Level 0); Mildly thick liquids (Level 2, nectar thick); Moderately thick liquids (Level 3, honey thick); Puree; Solid  Oral Impairment Domain: Oral Impairment Domain Lip Closure: No labial escape Tongue control during bolus hold: Posterior escape of less than half of bolus Bolus preparation/mastication: Slow prolonged chewing/mashing with complete recollection Bolus transport/lingual motion: Delayed initiation of tongue motion (oral holding) Oral residue: Complete oral clearance Location of oral residue : N/A Initiation of pharyngeal swallow : Valleculae  Pharyngeal Impairment Domain: Pharyngeal Impairment Domain Soft palate elevation: No bolus between soft palate (SP)/pharyngeal wall (PW) Laryngeal elevation: Partial superior movement of thyroid  cartilage/partial approximation of arytenoids to epiglottic petiole Anterior hyoid excursion: Complete anterior movement Epiglottic movement: Partial inversion Laryngeal vestibule closure: Complete, no air/contrast in laryngeal vestibule Pharyngeal stripping wave : Present - complete Pharyngeal contraction (A/P view only): N/A Pharyngoesophageal segment opening: Complete distension and complete duration, no obstruction of flow Tongue base retraction: No contrast between tongue base and posterior pharyngeal wall (PPW) Pharyngeal residue: Collection of residue within or on pharyngeal structures Location of pharyngeal residue: Valleculae  Esophageal Impairment Domain: Esophageal Impairment Domain Esophageal clearance upright position: Complete clearance, esophageal coating Pill: Pill Consistency administered: Thin liquids (Level 0); Puree Thin liquids (Level 0): Impaired (see clinical impressions) Puree: WFL Penetration/Aspiration Scale Score: Penetration/Aspiration Scale Score 1.  Material does not enter airway: Solid;  Puree; Moderately thick liquids (Level 3, honey thick); Mildly thick liquids (Level 2, nectar thick) 2.  Material enters airway, remains ABOVE vocal cords then ejected out: Thin liquids (Level 0) Compensatory Strategies: Compensatory Strategies Compensatory strategies: Yes Straw: Effective Effective Straw: Thin liquid (Level 0) (Amount and level of penetration same with cup sips compared with straw sips.)   General Information: Caregiver present: No  Diet Prior to this Study: Dysphagia 3 (mechanical soft); Thin liquids (Level 0)   Temperature : Normal   Respiratory Status: WFL   Supplemental O2: Nasal cannula   No data recorded Behavior/Cognition: Alert; Cooperative; Pleasant mood; Lethargic/Drowsy Self-Feeding Abilities: Able to self-feed No data recorded No data recorded Volitional Swallow: Able to elicit Exam Limitations: No limitations Goal Planning: No data recorded No data recorded No data recorded No data recorded No data recorded Pain: Pain Assessment Pain Assessment: Faces Faces Pain Scale: 0 Breathing: 0 Negative Vocalization: 1 Facial Expression: 0 Body Language: 0 Consolability: 0 PAINAD Score: 1 Facial Expression: 0 Body Movements: 0 Muscle Tension: 0 Compliance with ventilator (intubated pts.): N/A Vocalization (extubated pts.): 0 CPOT Total: 0 End of Session: Start Time:SLP Start Time (ACUTE ONLY): 1203 Stop Time: SLP Stop Time (ACUTE ONLY): 1217 Time Calculation:SLP Time Calculation (min) (ACUTE ONLY): 14 min Charges: SLP Evaluations $ SLP Speech Visit: 1 Visit SLP Evaluations $MBS Swallow: 1 Procedure $Swallowing Treatment: 1 Procedure SLP visit diagnosis: SLP Visit Diagnosis: Dysphagia, oropharyngeal phase (R13.12) Past Medical History: Past Medical History: Diagnosis Date  Abnormal CT of the chest 2009  nodule 9 x 6 mm RUL  Benign localized hyperplasia of prostate with urinary obstruction and other lower urinary tract symptoms (LUTS)(600.21)   Cardiomegaly   Cerebrovascular accident (stroke) (HCC)  2009  lacunar infarct in the left thalamus  Cholelithiasis   Chronic airway obstruction, not elsewhere classified   Chronic kidney disease, stage III (moderate) (HCC)   Closed femur fracture (HCC) 1960  Contact with or exposure to viral disease   suspected   Illiterate   Obesity, unspecified   Other and unspecified hyperlipidemia   Special screening examination for respiratory tuberculosis   Tobacco use disorder   Tuberculosis   Type II or unspecified type diabetes mellitus with renal manifestations, uncontrolled(250.42)   Unspecified essential hypertension  Past Surgical History: Past Surgical History: Procedure Laterality Date  ABDOMINAL AORTOGRAM W/LOWER EXTREMITY N/A 10/25/2023  Procedure: ABDOMINAL AORTOGRAM W/LOWER EXTREMITY;  Surgeon: Pearline Norman RAMAN, MD;  Location: [facility] INVASIVE [facility];  Service: Cardiovascular;  Laterality: N/A;  AMPUTATION Right 10/28/2023  Procedure: AMPUTATION ABOVE KNEE;  Surgeon: Serene Gaile ORN, MD;  Location: [facility] OR;  Service: Vascular;  Laterality: Right;  CYSTOSCOPY  05/19/2024  Procedure: PHYLLIS SIDE;  Surgeon: Selma Donnice SAUNDERS, MD;  Location: [facility] OR;  Service: Urology;;  FEMUR FRACTURE SURGERY Left 1960  INCISION AND DRAINAGE ABSCESS N/A 05/19/2024  Procedure: INCISION AND DRAINAGE, PERINEAL ABSCESS;  Surgeon: Selma Donnice SAUNDERS, MD;  Location: [facility] OR;  Service: Urology;  Laterality: N/A;  Incision and Drainage of Scrotal abscess  Norleen IVAR Blase, MA, CCC-SLP Speech Therapy    Scheduled Meds:  arformoterol   15 mcg Nebulization BID   atorvastatin   40 mg Oral Daily   budesonide   0.5 mg Nebulization BID   Chlorhexidine  Gluconate Cloth  6 each Topical Daily   doxazosin   2 mg Oral Daily   feeding supplement  237 mL Oral TID BM   ferrous sulfate   325 mg Oral BID WC   furosemide   20 mg Oral Q M,W,F   heparin   5,000 Units Subcutaneous Q8H   insulin  aspart  0-6 Units Subcutaneous TID WC   linezolid   600 mg Oral Q12H   metoprolol  tartrate  12.5 mg Oral BID   multivitamin with  minerals  1 tablet Oral Daily   mupirocin  ointment  1 Application Nasal BID   pantoprazole   40 mg Oral Daily   QUEtiapine   25 mg Oral QHS   revefenacin   175 mcg Nebulization Daily   sodium chloride  flush  3 mL Intravenous Q12H   tamsulosin   0.4 mg Oral Daily   Continuous Infusions:  ceFEPime  (MAXIPIME ) IV Stopped (06/13/24 2232)     LOS: 5 days   Fredia Skeeter, MD [facility]  06/14/2024, 1:21 PM   *Please note that this is a verbal dictation therefore any spelling or grammatical errors are due to the Dragon Medical One system interpretation.  Please page via Amion and do not message via secure chat for urgent patient care matters. Secure chat can be used for non urgent patient care matters.  How to contact the [facility] Attending or Consulting provider 7A - 7P or covering provider during after hours 7P -7A, for this patient?  Check the care team in [facility] and look for a) attending/consulting [facility] provider listed and b) the [facility] team listed. Page or secure chat 7A-7P. Log into [email or web address] and use [place]'s universal password to access. If you do not have the password, please contact the hospital operator. Locate the [facility] provider you are looking for under [facility] and page to a number that you can be directly reached. If you still have difficulty reaching the provider, please page the [facility] (Director on Call) for the Hospitalists listed on amion for assistance.

## 2024-06-14 NOTE — Plan of Care (Signed)
   Problem: Education: Goal: Ability to describe self-care measures that may prevent or decrease complications (Diabetes Survival Skills Education) will improve Outcome: Progressing Goal: Individualized Educational Video(s) Outcome: Progressing

## 2024-06-14 NOTE — TOC Progression Note (Addendum)
 Transition of Care [facility]) - Progression Note    Patient Details  Name: Clarence Maldonado MRN: [id number] Date of Birth: 13-Aug-1946  [facility] CM/SW Contact  Isaiah Public, LCSWA Phone Number: 06/14/2024, 10:38 AM  Clinical Narrative:     Jon CMA informed CSW that patients insurance authorization is currently pending for SNF. Patient has SNF bed at [facility] pending insurance authorization approval. CSW informed MD.  Update- Patients insurance requesting current PT/OT notes. CSW informed PT.CSW informed MD. CSW awaiting current PT/OT notes to send to patients insurance.  Update- CSW updated current PT note into [employer] portal for review. CSW awaiting determination.  Update- Patients insurance authorization went to a peer to peer for review. Peer to peer deadline by sept 11th 11:30am eastern standard time tele# [phone] option 5 . Need patients name, DOB, and subscriber # [id number]. CSW informed MD. Yolande with [facility] informed CSW that facility can accept patient when medically ready for dc even if peer to peer is not approved. CSW informed MD.  Expected Discharge Plan: Skilled Nursing Facility Barriers to Discharge: Continued Medical Work up, English as a second language teacher               Expected Discharge Plan and Services In-house Referral: Clinical Social Work   Post Acute Care Choice: NA Living arrangements for the past 2 months: Skilled Nursing Facility                                       Social Drivers of Health (SDOH) Interventions SDOH Screenings   Food Insecurity: No Food Insecurity (06/10/2024)  Housing: Low Risk  (06/10/2024)  Transportation Needs: No Transportation Needs (06/10/2024)  Utilities: Not At Risk (06/10/2024)  Depression (PHQ2-9): Low Risk  (10/14/2023)  Financial Resource Strain: Low Risk  (03/23/2019)  Physical Activity: Inactive (03/23/2019)  Social Connections: Socially Isolated (06/10/2024)  Stress: No Stress Concern Present (03/23/2019)  Tobacco  Use: Medium Risk (06/09/2024)    Readmission Risk Interventions    06/08/2024    3:02 PM  Readmission Risk Prevention Plan  Transportation Screening Complete  Medication Review (RN Care Manager) Complete  PCP or Specialist appointment within 3-5 days of discharge Complete  HRI or Home Care Consult Complete  SW Recovery Care/Counseling Consult Complete  Palliative Care Screening Not Applicable  Skilled Nursing Facility Complete

## 2024-06-14 NOTE — Progress Notes (Addendum)
 Physical Therapy Treatment Patient Details Name: Clarence Maldonado MRN: [id number] DOB: 1946/01/27 Today's Date: 06/14/2024   History of Present Illness Pt is a 78 y/o male readmitted 9/5 from SNF after d/c same day from [facility] for respiratory failure.  Returns today with respiratory distress and sats in the 60's on 4L O2. He was placed on CPAP, given two DuoNeb treatments and 125 mg of Solu-Medrol . DG chest portable reported similar multifocal bilateral patchy opacities, in keeping with known pneumonia and sequela of prior infection/inflammation. SLP consulted and recommending soft diet with thin liquids.  PMHx significant for HTN, HLD, COPD, CKD 3b, DMT2, BPH, R AKA 1/25, perineal abscess s/p cystoscopy 8/15, anxiety/depression.    PT Comments  Pt received in supine, c/o fatigue and feeling odd, agreeable to therapy session with encouragement. PTA checked BP due to pt c/o increased fatigue and pale pallor, with pt noted to have BP MAP (55), RN called to room and notified. Pt assisted for hygiene as bed observed to be soiled, after supine UE/LE exercsies and feet elevated above his heart for ~5 mins, BP improving. Greatly increased time/cues for pt to roll and totalA for hygiene assist, NT assisting due to pt needing primofit removal/replacement. BP improved after supine exercises but pt pallor remains pale, RN aware. Overall pt with good participation with simple commands but remains limited due to symptoms likely related to soft BP. SpO2 WFL on RA when assessed. Defer EOB/OOB due to symptomatic low BP during session. Patient will benefit from continued inpatient follow up therapy, <3 hours/day. Vital Signs  BP @ 1211 pm (!) 88/43 (HOB flat)  BP Location Left Arm  BP Method Automatic  Patient Position (if appropriate) Lying (HOB flat; pt c/o feeling odd)   Vital Signs  BP @1215pm  105/60  BP Location Left Arm  BP Method Automatic  Patient Position (if appropriate)  (bed in trend -8 degrees legs  above heart)   Vital Signs  BP @ 12:38pm 101/64  BP Location Left Arm  BP Method Automatic  Patient Position (if appropriate) Lying (HOB flat after rolling/bed linens change and bath)   Vital Signs  Pulse Rate 82  Pulse Rate Source Monitor  BP @ 12:41pm 100/60  BP Location Left Arm  BP Method Automatic  Patient Position (if appropriate)  (HOB 50 degrees after supine UE exercises performed and rolling L/R multiple times for bed bath)     If plan is discharge home, recommend the following: A lot of help with bathing/dressing/bathroom;Assistance with cooking/housework;Direct supervision/assist for medications management;Direct supervision/assist for financial management;Assist for transportation;Help with stairs or ramp for entrance;Supervision due to cognitive status;Two people to help with walking and/or transfers   Can travel by private vehicle     No  Equipment Recommendations  Other (comment) (TBD; currently recommend wheelchair, hoyer lift, RW)    Recommendations for Other Services       Precautions / Restrictions Precautions Precautions: Fall Recall of Precautions/Restrictions: Impaired Precaution/Restrictions Comments: condom cath, perineal wound with packing, bowel incontinence Restrictions Weight Bearing Restrictions Per Provider Order: No     Mobility  Bed Mobility Overal bed mobility: Needs Assistance Bed Mobility: Supine to Sit, Rolling Rolling: Min assist, Used rails, Contact guard assist   Supine to sit: Min assist, Used rails, HOB elevated Sit to supine: Min assist   General bed mobility comments: stability and forward assist to come to EOB, cues for use of rails; long sitting in bed only due to pt evolving c/o feeling different and notably  pale pallor. RN notified of soft BP during session.    Transfers Overall transfer level: Needs assistance                 General transfer comment: PTA defer EOB/OOB due to soft BP and pale pallor     Ambulation/Gait                   Stairs             Wheelchair Mobility     Tilt Bed    Modified Rankin (Stroke Patients Only)       Balance Overall balance assessment: Needs assistance Sitting-balance support: No upper extremity supported, Feet unsupported Sitting balance-Leahy Scale: Poor Sitting balance - Comments: long sitting only briefly; needing external support due to suboptimal pelvic posture on mattress and defer EOB due to low BP       Standing balance comment: PTA defer for pt safety, BP too low                            Communication Communication Communication: Impaired Factors Affecting Communication: Difficulty expressing self;Reduced clarity of speech;Other (comment) (did not notify staff of + BM)  Cognition Arousal: Alert Behavior During Therapy: Anxious   PT - Cognitive impairments: No family/caregiver present to determine baseline, Awareness, Problem solving, Safety/Judgement, Memory, Attention, Sequencing Difficult to assess due to: Impaired communication Orientation impairments: Time, Situation                   PT - Cognition Comments: Pt initially drowsy and c/o feeling odd, vitals assessed and pt noted to have very low BP, RN notified. Pt assisted to reposition and bed placed in reverse trendelenburg for a few minutes, with improvement in BP, then pt assisted for peri-care due to soiled bed linens (pt seemed unaware), and after bed  mobility/rolling and UE exercises in supine, pt positioned more upright to sip water  and BP improved. Pt frequently expressing appreciation for care provided and states I love you guys to staff frequently. Following commands: Impaired Following commands impaired: Follows one step commands with increased time, Follows one step commands inconsistently (needs repetition)    Cueing Cueing Techniques: Verbal cues, Tactile cues, Gestural cues  Exercises General Exercises - Lower  Extremity Heel Slides: AROM, Both, 10 reps, Supine Hip ABduction/ADduction: AAROM, Both, Supine (a few reps ea side while repostioning/performing hygiene assist) Other Exercises Other Exercises: supine BUE AA/AROM: shoulder/elbow flex/ext. x15 reps ea Other Exercises: cues for UE cross-body pushing/pulling in d1 flexion pattern but poor carryover of instruction, pt performs pulling to light resistance but not able to push away with cues; ~5 reps ea side in supine Other Exercises: Flutter valve x3 reps for teachback; encouraged hourly use    General Comments General comments (skin integrity, edema, etc.): see BP in comments above; initial MAP (55) and improved to (74) with HOB >40 deg after exercises in supine and repositioning multiple times, although pallor remains somewhat pale. PTA remained in room at end of session while NT assisting pt with removal/replacement of primofit for pt/staff safety as he had irritability previous date per RN. Pt cooperative this date. RN notified of pt perineal wound packing appearing soiled after clean-up.      Pertinent Vitals/Pain Pain Assessment Pain Assessment: PAINAD Faces Pain Scale: Hurts little more Breathing: normal Negative Vocalization: none Facial Expression: smiling or inexpressive Body Language: relaxed Consolability: no need to console PAINAD Score: 0  Pain Location: only c/o pain with posterior hygiene assist when pt found to have BM Pain Descriptors / Indicators: Discomfort, Grimacing, Guarding Pain Intervention(s): Monitored during session, Repositioned, Other (comment) (pt assisted for posterior hygiene via rolling to L/R sides and NT present also to assist for pt safety.)    Home Living                          Prior Function            PT Goals (current goals can now be found in the care plan section) Acute Rehab PT Goals Patient Stated Goal: figure out where I'm going to end up. PT Goal Formulation: Patient unable  to participate in goal setting Time For Goal Achievement: 06/24/24 (pt readmitted 06/09/24 and evaluating therapist forgot to place new goal date) Potential to Achieve Goals: Fair Progress towards PT goals: Progressing toward goals    Frequency    Min 2X/week      PT Plan      Co-evaluation              AM-PAC PT 6 Clicks Mobility   Outcome Measure  Help needed turning from your back to your side while in a flat bed without using bedrails?: A Little Help needed moving from lying on your back to sitting on the side of a flat bed without using bedrails?: A Lot Help needed moving to and from a bed to a chair (including a wheelchair)?: Total Help needed standing up from a chair using your arms (e.g., wheelchair or bedside chair)?: A Lot Help needed to walk in hospital room?: Total Help needed climbing 3-5 steps with a railing? : Total 6 Click Score: 10    End of Session   Activity Tolerance: Treatment limited secondary to medical complications (Comment);Other (comment) (soft BP and need for bed bath/clean-up limiting upright activity) Patient left: in bed;with call bell/phone within reach;with bed alarm set Nurse Communication: Mobility status;Need for lift equipment PT Visit Diagnosis: Difficulty in walking, not elsewhere classified (R26.2);Muscle weakness (generalized) (M62.81);Unsteadiness on feet (R26.81);Other symptoms and signs involving the nervous system (R29.898);Adult, failure to thrive (R62.7);Other abnormalities of gait and mobility (R26.89)     Time: 8798-8755 PT Time Calculation (min) (ACUTE ONLY): 43 min  Charges:    $Therapeutic Exercise: 8-22 mins $Therapeutic Activity: 8-22 mins PT General Charges $$ ACUTE PT VISIT: 1 Visit                     Teron Blais P., PTA Acute Rehabilitation Services Secure Chat Preferred 9a-5:30pm Office: [phone]    Connell [facility] 06/14/2024, 2:24 PM

## 2024-06-15 DIAGNOSIS — J9601 Acute respiratory failure with hypoxia: Secondary | ICD-10-CM | POA: Diagnosis not present

## 2024-06-15 DIAGNOSIS — J9602 Acute respiratory failure with hypercapnia: Secondary | ICD-10-CM | POA: Diagnosis not present

## 2024-06-15 LAB — GLUCOSE, CAPILLARY
Glucose-Capillary: 309 mg/dL — ABNORMAL HIGH (ref 70–99)
Glucose-Capillary: 165 mg/dL — ABNORMAL HIGH (ref 70–99)

## 2024-06-15 MED ORDER — LINEZOLID 600 MG PO TABS: 600.0000 mg | ORAL_TABLET | Freq: Two times a day (BID) | ORAL | 0 refills | Status: AC

## 2024-06-15 MED ORDER — METOPROLOL TARTRATE 25 MG PO TABS: 25.0000 mg | ORAL_TABLET | Freq: Two times a day (BID) | ORAL | 0 refills | Status: AC

## 2024-06-15 MED ORDER — DOXAZOSIN MESYLATE 4 MG PO TABS: 4.0000 mg | ORAL_TABLET | Freq: Every day | ORAL | 0 refills | Status: AC

## 2024-06-15 MED ORDER — ARFORMOTEROL TARTRATE 15 MCG/2ML IN NEBU: 15.0000 ug | INHALATION_SOLUTION | Freq: Two times a day (BID) | RESPIRATORY_TRACT | Status: AC

## 2024-06-15 MED ORDER — REVEFENACIN 175 MCG/3ML IN SOLN: 175.0000 ug | Freq: Every day | RESPIRATORY_TRACT | Status: AC

## 2024-06-15 MED ORDER — ALBUTEROL SULFATE (2.5 MG/3ML) 0.083% IN NEBU: 2.5000 mg | INHALATION_SOLUTION | RESPIRATORY_TRACT | Status: AC | PRN

## 2024-06-15 MED ORDER — BUDESONIDE 0.5 MG/2ML IN SUSP: 0.5000 mg | Freq: Two times a day (BID) | RESPIRATORY_TRACT | Status: AC

## 2024-06-15 NOTE — TOC Progression Note (Addendum)
 Transition of Care [facility]) - Progression Note    Patient Details  Name: Clarence Maldonado MRN: [id number] Date of Birth: 08-07-1946  [facility] CM/SW Contact  Isaiah Public, LCSWA Phone Number: 06/15/2024, 10:41 AM  Clinical Narrative:     Peer to peer still pending for insurance authorization for SNF. Kia with [facility] informed CSW that facility can accept patient today with peer to peer still pending, and even if peer to peer not approved if medically ready. MD informed. CSW will continue to follow.  Expected Discharge Plan: Skilled Nursing Facility Barriers to Discharge: No Barriers Identified               Expected Discharge Plan and Services In-house Referral: Clinical Social Work   Post Acute Care Choice: NA Living arrangements for the past 2 months: Skilled Nursing Facility Expected Discharge Date: 06/15/24                                     Social Drivers of Health (SDOH) Interventions SDOH Screenings   Food Insecurity: No Food Insecurity (06/10/2024)  Housing: Low Risk  (06/10/2024)  Transportation Needs: No Transportation Needs (06/10/2024)  Utilities: Not At Risk (06/10/2024)  Depression (PHQ2-9): Low Risk  (10/14/2023)  Financial Resource Strain: Low Risk  (03/23/2019)  Physical Activity: Inactive (03/23/2019)  Social Connections: Socially Isolated (06/10/2024)  Stress: No Stress Concern Present (03/23/2019)  Tobacco Use: Medium Risk (06/09/2024)    Readmission Risk Interventions    06/08/2024    3:02 PM  Readmission Risk Prevention Plan  Transportation Screening Complete  Medication Review (RN Care Manager) Complete  PCP or Specialist appointment within 3-5 days of discharge Complete  HRI or Home Care Consult Complete  SW Recovery Care/Counseling Consult Complete  Palliative Care Screening Not Applicable  Skilled Nursing Facility Complete

## 2024-06-15 NOTE — Plan of Care (Signed)
  Problem: Education: Goal: Ability to describe self-care measures that may prevent or decrease complications (Diabetes Survival Skills Education) will improve Outcome: Progressing Goal: Individualized Educational Video(s) Outcome: Progressing   Problem: Coping: Goal: Ability to adjust to condition or change in health will improve Outcome: Progressing   Problem: Health Behavior/Discharge Planning: Goal: Ability to identify and utilize available resources and services will improve Outcome: Progressing Goal: Ability to manage health-related needs will improve Outcome: Progressing   Problem: Health Behavior/Discharge Planning: Goal: Ability to identify and utilize available resources and services will improve Outcome: Progressing Goal: Ability to manage health-related needs will improve Outcome: Progressing   Problem: Metabolic: Goal: Ability to maintain appropriate glucose levels will improve Outcome: Progressing

## 2024-06-15 NOTE — Discharge Summary (Signed)
 Physician Discharge Summary  Clarence Maldonado FMW:[id number] DOB: 11-12-1945 DOA: 06/09/2024  PCP: Gil Greig BRAVO, NP  Admit date: 06/09/2024 Discharge date: 06/15/2024 30 Day Unplanned Readmission Risk Score    Flowsheet Row ED to Hosp-Admission (Current) from 06/09/2024 in [facility]  30 Day Unplanned Readmission Risk Score (%) 33.09 Filed at 06/15/2024 0400    This score is the patient's risk of an unplanned readmission within 30 days of being discharged (0 -100%). The score is based on dignosis, age, lab data, medications, orders, and past utilization.   Low:  0-14.9   Medium: 15-21.9   High: 22-29.9   Extreme: 30 and above          Admitted From: Home Disposition: SNF  Recommendations for Outpatient Follow-up:  Follow up with PCP in 1-2 weeks Please obtain BMP/CBC in one week Please follow up with your PCP on the following pending results: Unresulted Labs (From admission, onward)     Start     Ordered   06/09/24 2040  Expectorated Sputum Assessment w Gram Stain, Rflx to Resp Cult  Once,   R        06/09/24 2040              Home Health: None Equipment/Devices: None  Discharge Condition: Stable CODE STATUS: Full code Diet recommendation:  Diet Order             DIET DYS 3 Room service appropriate? Yes; Fluid consistency: Thin  Diet effective now                   Subjective: Patient seen and examined, no complaints.  He is feeling and looking much better.  Not as anxious as he was yesterday.  He is on room air.  Denies any shortness of breath.  Brief/Interim Summary: Clarence Maldonado is a 78 y.o. male with hx of Dementia, CVA, dysphagia, CAD, hx silent Mis, HFimpEF, CKD3b, HTN, HLD, DM type 2, COPD, BPH, R AKA, anxiety / depression, prior smoking, prior pulmonary TB, recent complex hospitalization from 8/8 - 9/5 for respiratory failure requiring intubation in the setting of human metapneumovirus and pseudomonas pneumonia, septic shock in  setting of candida glabrata fungemia and perineal abscess requiring vasopressor, and additional problems including AKI, hyperkalemia, urinary retention, and dysphagia; he was brought back in on same day of discharge due to acute hypoxic respiratory failure requiring NIV.    Acute hypoxic and hypercapneic respiratory failure differential include aspiration pneumonia vs panic attack vs residual pneumonia.  CXR on admission shows Similar multifocal bilateral patchy opacities, in keeping with known pneumonia and sequela of prior infection/inflammation. Currently on room air.  Patient has been started on cefepime , he will receive his last/seventh day of antibiotic today, he was also started on Zyvox  for total of 7 days however he has not completed that, prescribing him rest of the doses as well.   Acute metabolic encephalopathy Appears to have improved.  He is alert and conversing well.    Stage 3b CKD Creatinine appears to be at baseline.  US  renal done and reviewed. Recommend outpatient non urgent MRI of the kidneys.    Hyperkalemia Resolved   Dysphagia SLP eval done , status post MBS, on dysphagia 3 diet.  SLP following.   Severe protein calorie malnutrition.  Encourage oral intake.    Chronic diastolic CHF He appears euvolemic.  Continue Lasix .   Type 2 Diabetes mellitus: On SSI, blood sugar labile.  Recent hemoglobin  A1c 5.7.  Patient not on any medications prior to admission.   Hypertension: Blood pressure low at times but much better today.  Patient on doxazosin , Flomax , blood pressure and Lasix .   BPH  On flomax  and Cardura .    Perineal wound Wound care consulted.    Anemia of chronic disease Hemoglobin appears to be stable.   Discharge plan was discussed with patient and/or family member and they verbalized understanding and agreed with it.  Discharge Diagnoses:  Principal Problem:   Acute respiratory failure with hypoxia and hypercapnia (HCC) Active Problems:    Essential hypertension   Hyperlipidemia   Tobacco use disorder   BPH without obstruction/lower urinary tract symptoms   Controlled type 2 diabetes mellitus with stage 3 chronic kidney disease, without long-term current use of insulin  [facility])    Discharge Instructions  Discharge Instructions     Amb Referral to Palliative Care   Complete by: As directed       Allergies as of 06/15/2024   No Known Allergies      Medication List     STOP taking these medications    acetaminophen  325 MG tablet Commonly known as: TYLENOL    feeding supplement Liqd   multivitamin with minerals Tabs tablet       TAKE these medications    albuterol  (2.5 MG/3ML) 0.083% nebulizer solution Commonly known as: PROVENTIL  Take 3 mLs (2.5 mg total) by nebulization every 4 (four) hours as needed for wheezing or shortness of breath.   arformoterol  15 MCG/2ML Nebu Commonly known as: BROVANA  Take 2 mLs (15 mcg total) by nebulization 2 (two) times daily.   atorvastatin  40 MG tablet Commonly known as: LIPITOR Take 40 mg by mouth at bedtime.   budesonide  0.5 MG/2ML nebulizer solution Commonly known as: PULMICORT  Take 2 mLs (0.5 mg total) by nebulization 2 (two) times daily.   docusate sodium  100 MG capsule Commonly known as: COLACE Take 1 capsule (100 mg total) by mouth daily. What changed: Another medication with the same name was removed. Continue taking this medication, and follow the directions you see here.   doxazosin  4 MG tablet Commonly known as: CARDURA  Take 1 tablet (4 mg total) by mouth daily.   ferrous sulfate  325 (65 FE) MG tablet Commonly known as: FeroSul Take 1 tablet (325 mg total) by mouth 2 (two) times daily with a meal.   furosemide  20 MG tablet Commonly known as: LASIX  Take 1 tablet (20 mg total) by mouth 3 (three) times a week.   insulin  aspart 100 UNIT/ML injection Commonly known as: novoLOG  Inject 0-9 Units into the skin 3 (three) times daily with meals. insulin   aspart (novoLOG ) injection 0-9 Units  0-9 Units, Subcutaneous, 3 times daily with meals, First dose on Mon 05/29/24 at 1200 Correction coverage: Sensitive (thin, NPO, renal) CBG < 70: Implement Hypoglycemia Standing Orders and refer to Hypoglycemia Standing Orders sidebar report CBG 70 - 120: 0 units CBG 121 - 150: 1 unit CBG 151 - 200: 2 units CBG 201 - 250: 3 units CBG 251 - 300: 5 units CBG 301 - 350: 7 units CBG 351 - 400: 9 units CBG > 400: call MD and obtain STAT lab verification   ipratropium-albuterol  0.5-2.5 (3) MG/3ML Soln Commonly known as: DUONEB Take 3 mLs by nebulization every 4 (four) hours as needed (Wheezing/SOB).   linezolid  600 MG tablet Commonly known as: ZYVOX  Take 1 tablet (600 mg total) by mouth every 12 (twelve) hours for 3 doses.   metoprolol  tartrate  25 MG tablet Commonly known as: LOPRESSOR  Take 1 tablet (25 mg total) by mouth 2 (two) times daily.   OXYGEN  Inhale 3 L into the lungs continuous.   pantoprazole  40 MG tablet Commonly known as: PROTONIX  Take 1 tablet (40 mg total) by mouth daily.   QUEtiapine  25 MG tablet Commonly known as: SEROQUEL  Take 1 tablet (25 mg total) by mouth at bedtime.   revefenacin  175 MCG/3ML nebulizer solution Commonly known as: YUPELRI  Take 3 mLs (175 mcg total) by nebulization daily.   tamsulosin  0.4 MG Caps capsule Commonly known as: FLOMAX  Take 1 capsule (0.4 mg total) by mouth daily. Take with food        Follow-up Information     Fargo, Amy E, NP Follow up in 1 week(s).   Specialty: Adult Health Nurse Practitioner Contact information: [address] [place] [address] [phone]                No Known Allergies  Consultations: None   Procedures/Studies: DG Chest Port 1 View Result Date: 06/13/2024 CLINICAL DATA:  Shortness of breath EXAM: PORTABLE CHEST 1 VIEW COMPARISON:  06/09/2024 FINDINGS: 2 frontal views of the chest demonstrate a stable cardiac silhouette. Chronic areas of scarring  are again seen throughout the lungs, with no new airspace disease, effusion, or pneumothorax. No acute bony abnormalities. IMPRESSION: 1. Stable pulmonary scarring.  No acute airspace disease. Electronically Signed   By: Ozell Daring M.D.   On: 06/13/2024 22:15   DG Swallowing Func-Speech Pathology Result Date: 06/13/2024 Table formatting from the original result was not included. Modified Barium Swallow Study Patient Details Name: EMIDIO WARRELL MRN: [id number] Date of Birth: [birth year] Today's Date: 06/13/2024 HPI/PMH: HPI: Patient is a 78 y.o. male with PMH: COPD, CDK 3b, DMT2, HTN, HLD, BPH, R AKA, anxiety/depression. He was admitted from 8/8-06/09/24 with worsening SOB. He presented back to the hospital from SNF on day of discharge (9/5) with respiratory distress; when EMS arrived they found him to be saturating in the 60%'s on 4L oxygen  at his facility. He was placed on CPAP, given two DuoNeb treatments and 125 mg of Solu-Medrol . DG chest portable reported Similar multifocal bilateral patchy opacities, in keeping with known pneumonia and sequela of prior infection/inflammation. SLP ordered to assess patient's swallow function. Clinical Impression: Clinical Impression: Patient presents with oropharyngeal dysphagia. Penetration was noted throughout the MBS with thin liquids. Penetration above the vocal folds that was fully ejected from the airway (PAS 2). No penetration or aspiration with nectar or honey thick liquids. Patient had slow prolonged bolus preparation as well as posterior escape of less than half of bolus. Delayed initiation of tongue motion was noted throughout the evaluation. Partial inversion of epiglottic movement was noted as well as partial superior movment of thyroid  cartilage. A collection of residue was noted lining patient's valleculae after thin, mildly thick, and moderately thick liquids. A barium tablet was administered and patient required a puree to clear the tablet from oral cavity.  Tablet then transitted through pharynx and esophagus without difficulty. SLP recommending continue with Dys 3 (mechanical soft) diet and thin liquids. SLP to consider downgrading to nectar thick liquids if coughing with PO's persist. Factors that may increase risk of adverse event in presence of aspiration Noe & Lianne 2021): Factors that may increase risk of adverse event in presence of aspiration Noe & Lianne 2021): Frail or deconditioned; Reduced cognitive function; Poor general health and/or compromised immunity; Respiratory or GI disease Recommendations/Plan: Swallowing Evaluation Recommendations  Swallowing Evaluation Recommendations Recommendations: PO diet PO Diet Recommendation: Dysphagia 3 (Mechanical soft); Thin liquids (Level 0) Liquid Administration via: Cup; Straw; Spoon Medication Administration: Whole meds with puree Supervision: Patient able to self-feed Swallowing strategies  : Minimize environmental distractions; Slow rate; Small bites/sips Postural changes: Stay upright 30-60 min after meals; Position pt fully upright for meals Oral care recommendations: Oral care BID (2x/day) Treatment Plan Treatment Plan Treatment recommendations: Therapy as outlined in treatment plan below Functional status assessment: Patient has had a recent decline in their functional status and demonstrates the ability to make significant improvements in function in a reasonable and predictable amount of time. Recommendations Recommendations for follow up therapy are one component of a multi-disciplinary discharge planning process, led by the attending physician.  Recommendations may be updated based on patient status, additional functional criteria and insurance authorization. Assessment: Orofacial Exam: Orofacial Exam Oral Cavity - Dentition: Edentulous Anatomy: Anatomy: WFL Boluses Administered: Boluses Administered Boluses Administered: Thin liquids (Level 0); Mildly thick liquids (Level 2, nectar thick);  Moderately thick liquids (Level 3, honey thick); Puree; Solid  Oral Impairment Domain: Oral Impairment Domain Lip Closure: No labial escape Tongue control during bolus hold: Posterior escape of less than half of bolus Bolus preparation/mastication: Slow prolonged chewing/mashing with complete recollection Bolus transport/lingual motion: Delayed initiation of tongue motion (oral holding) Oral residue: Complete oral clearance Location of oral residue : N/A Initiation of pharyngeal swallow : Valleculae  Pharyngeal Impairment Domain: Pharyngeal Impairment Domain Soft palate elevation: No bolus between soft palate (SP)/pharyngeal wall (PW) Laryngeal elevation: Partial superior movement of thyroid  cartilage/partial approximation of arytenoids to epiglottic petiole Anterior hyoid excursion: Complete anterior movement Epiglottic movement: Partial inversion Laryngeal vestibule closure: Complete, no air/contrast in laryngeal vestibule Pharyngeal stripping wave : Present - complete Pharyngeal contraction (A/P view only): N/A Pharyngoesophageal segment opening: Complete distension and complete duration, no obstruction of flow Tongue base retraction: No contrast between tongue base and posterior pharyngeal wall (PPW) Pharyngeal residue: Collection of residue within or on pharyngeal structures Location of pharyngeal residue: Valleculae  Esophageal Impairment Domain: Esophageal Impairment Domain Esophageal clearance upright position: Complete clearance, esophageal coating Pill: Pill Consistency administered: Thin liquids (Level 0); Puree Thin liquids (Level 0): Impaired (see clinical impressions) Puree: WFL Penetration/Aspiration Scale Score: Penetration/Aspiration Scale Score 1.  Material does not enter airway: Solid; Puree; Moderately thick liquids (Level 3, honey thick); Mildly thick liquids (Level 2, nectar thick) 2.  Material enters airway, remains ABOVE vocal cords then ejected out: Thin liquids (Level 0) Compensatory  Strategies: Compensatory Strategies Compensatory strategies: Yes Straw: Effective Effective Straw: Thin liquid (Level 0) (Amount and level of penetration same with cup sips compared with straw sips.)   General Information: Caregiver present: No  Diet Prior to this Study: Dysphagia 3 (mechanical soft); Thin liquids (Level 0)   Temperature : Normal   Respiratory Status: WFL   Supplemental O2: Nasal cannula   No data recorded Behavior/Cognition: Alert; Cooperative; Pleasant mood; Lethargic/Drowsy Self-Feeding Abilities: Able to self-feed No data recorded No data recorded Volitional Swallow: Able to elicit Exam Limitations: No limitations Goal Planning: No data recorded No data recorded No data recorded No data recorded No data recorded Pain: Pain Assessment Pain Assessment: Faces Faces Pain Scale: 0 Breathing: 0 Negative Vocalization: 1 Facial Expression: 0 Body Language: 0 Consolability: 0 PAINAD Score: 1 Facial Expression: 0 Body Movements: 0 Muscle Tension: 0 Compliance with ventilator (intubated pts.): N/A Vocalization (extubated pts.): 0 CPOT Total: 0 End of Session: Start Time:SLP Start Time (ACUTE ONLY): 1203  Stop Time: SLP Stop Time (ACUTE ONLY): 1217 Time Calculation:SLP Time Calculation (min) (ACUTE ONLY): 14 min Charges: SLP Evaluations $ SLP Speech Visit: 1 Visit SLP Evaluations $MBS Swallow: 1 Procedure $Swallowing Treatment: 1 Procedure SLP visit diagnosis: SLP Visit Diagnosis: Dysphagia, oropharyngeal phase (R13.12) Past Medical History: Past Medical History: Diagnosis Date  Abnormal CT of the chest 2009  nodule 9 x 6 mm RUL  Benign localized hyperplasia of prostate with urinary obstruction and other lower urinary tract symptoms (LUTS)(600.21)   Cardiomegaly   Cerebrovascular accident (stroke) (HCC) 2009  lacunar infarct in the left thalamus  Cholelithiasis   Chronic airway obstruction, not elsewhere classified   Chronic kidney disease, stage III (moderate) (HCC)   Closed femur fracture (HCC) 1960   Contact with or exposure to viral disease   suspected   Illiterate   Obesity, unspecified   Other and unspecified hyperlipidemia   Special screening examination for respiratory tuberculosis   Tobacco use disorder   Tuberculosis   Type II or unspecified type diabetes mellitus with renal manifestations, uncontrolled(250.42)   Unspecified essential hypertension  Past Surgical History: Past Surgical History: Procedure Laterality Date  ABDOMINAL AORTOGRAM W/LOWER EXTREMITY N/A 10/25/2023  Procedure: ABDOMINAL AORTOGRAM W/LOWER EXTREMITY;  Surgeon: Pearline Norman RAMAN, MD;  Location: [facility];  Service: Cardiovascular;  Laterality: N/A;  AMPUTATION Right 10/28/2023  Procedure: AMPUTATION ABOVE KNEE;  Surgeon: Serene Gaile ORN, MD;  Location: [facility] OR;  Service: Vascular;  Laterality: Right;  CYSTOSCOPY  05/19/2024  Procedure: PHYLLIS SIDE;  Surgeon: Selma Donnice SAUNDERS, MD;  Location: [facility] OR;  Service: Urology;;  FEMUR FRACTURE SURGERY Left 1960  INCISION AND DRAINAGE ABSCESS N/A 05/19/2024  Procedure: INCISION AND DRAINAGE, PERINEAL ABSCESS;  Surgeon: Selma Donnice SAUNDERS, MD;  Location[facility] OR;  Service: Urology;  Laterality: N/A;  Incision and Drainage of Scrotal abscess  Norleen IVAR Blase, MA, CCC-SLP Speech Therapy   US  RENAL Result Date: 06/10/2024 CLINICAL DATA:  Acute kidney injury EXAM: RENAL / URINARY TRACT ULTRASOUND COMPLETE COMPARISON:  None Available. FINDINGS: Right Kidney: Length = 13.3 cm Normal parenchymal echogenicity with preserved corticomedullary differentiation. Exophytic cyst in the lower pole measures 7.5 x 7.1 x 7.0 cm with a possible thick internal septation along the inferior aspect. No urinary tract dilation or shadowing calculi. The ureter is not seen. Left Kidney: Length = 8.5 cm Diffusely increased cortical echogenicity with loss of corticomedullary differentiation which can be seen with medical renal disease. No urinary tract dilation or shadowing calculi. The ureter is not seen. Bladder:  Layering, avascular hypoechogenicity within the urinary bladder. Other: None. IMPRESSION: 1. No urinary tract dilation or shadowing calculi. 2. Layering, avascular hypoechogenicity within the urinary bladder, which may represent debris or blood products. Recommend correlation with urinalysis. 3. Exophytic cyst in the lower pole of the right kidney measures 7.5 x 7.1 x 7.0 cm with a possible thick internal septation along the inferior aspect. Recommend further evaluation with nonemergent contrast-enhanced renal protocol MRI or CT. 4. Diffusely increased cortical echogenicity of the atrophic left kidney with loss of corticomedullary differentiation which can be seen with medical renal disease. Electronically Signed   By: Limin  Xu M.D.   On: 06/10/2024 11:11   DG Chest Portable 1 View Result Date: 06/09/2024 CLINICAL DATA:  Respiratory distress EXAM: PORTABLE CHEST 1 VIEW COMPARISON:  CT chest dated 06/06/2024, chest radiograph dated 06/05/2024 FINDINGS: Normal lung volumes. Similar multifocal bilateral patchy opacities. No pleural effusion or pneumothorax. The heart size and mediastinal contours are within normal  limits. No acute osseous abnormality. Old left clavicular fracture deformity. IMPRESSION: Similar multifocal bilateral patchy opacities, in keeping with known pneumonia and sequela of prior infection/inflammation. Electronically Signed   By: Limin  Xu M.D.   On: 06/09/2024 18:27   CT CHEST WO CONTRAST Result Date: 06/06/2024 CLINICAL DATA:  Lung nodule. EXAM: CT CHEST WITHOUT CONTRAST TECHNIQUE: Multidetector CT imaging of the chest was performed following the standard protocol without IV contrast. RADIATION DOSE REDUCTION: This exam was performed according to the departmental dose-optimization program which includes automated exposure control, adjustment of the mA and/or kV according to patient size and/or use of iterative reconstruction technique. COMPARISON:  Radiographs September 04, 2024. CT scan of  October 08, 2022. FINDINGS: Cardiovascular: Atherosclerosis of thoracic aorta without aneurysm formation. Normal cardiac size. No pericardial effusion. Coronary artery calcifications are noted. Mediastinum/Nodes: Small sliding-type hiatal hernia. No adenopathy is noted. Thyroid  gland is unremarkable. Lungs/Pleura: No pneumothorax or pleural effusion is noted. New moderate size opacity is noted posteriorly in right lower lobe concerning for pneumonia. 22 x 11 mm spiculated density is noted superiorly in this area which may simply represent inflammation, but short-term follow-up CT scan in 2-3 weeks is recommended to ensure resolution and rule out underlying malignancy. Emphysematous disease is noted bilaterally. Stable bilateral upper lobe scarring is noted. 11 x 4 mm calcified nodule noted anteriorly in right upper lobe which was present on prior exam. Upper Abdomen: Cholelithiasis. Musculoskeletal: No chest wall mass or suspicious bone lesions identified. IMPRESSION: 1. New moderate size opacity is noted posteriorly in right lower lobe concerning for pneumonia. 22 x 11 mm spiculated density is noted superiorly in this area which may simply represent inflammation, but short-term follow-up CT scan in 2-3 weeks is recommended to ensure resolution and rule out underlying malignancy. 2. Coronary artery calcifications are noted. 3. Small sliding-type hiatal hernia. 4. Cholelithiasis. Aortic Atherosclerosis (ICD10-I70.0) and Emphysema (ICD10-J43.9). Electronically Signed   By: Lynwood Landy Raddle M.D.   On: 06/06/2024 14:23   DG Chest Port 1 View Result Date: 06/05/2024 CLINICAL DATA:  Shortness of breath. EXAM: PORTABLE CHEST 1 VIEW COMPARISON:  06/03/2024 FINDINGS: Lungs are hyperexpanded as before. Areas of architectural distortion and interstitial opacity, left greater than right, are similar to prior likely scarring. 18 mm nodular opacity in the right lower lung is better demonstrated on the current study. No  substantial pleural effusion. The cardiopericardial silhouette is within normal limits for size. No acute bony abnormality. Telemetry leads overlie the chest. IMPRESSION: 1. 18 mm nodular opacity in the right lower lung is better demonstrated on the current study. CT chest without contrast recommended to further evaluate. 2. Otherwise stable exam with chronic interstitial changes and architectural distortion in the lungs bilaterally. Electronically Signed   By: Camellia Candle M.D.   On: 06/05/2024 07:34   DG Chest Port 1 View Result Date: 06/03/2024 EXAM: 1 VIEW XRAY OF THE CHEST 06/03/2024 07:22:00 AM COMPARISON: 05/30/2024 CLINICAL HISTORY: SOB (shortness of breath) FINDINGS: LUNGS AND PLEURA: Stable patchy reticular opacities in left upper lung. Emphysema. No pulmonary edema. No pleural effusion. No pneumothorax. HEART AND MEDIASTINUM: No acute abnormality of the cardiac and mediastinal silhouettes. Aortic atherosclerosis. BONES AND SOFT TISSUES: No acute osseous abnormality. IMPRESSION: 1. Unchanged left upper lobe predominant opacities likely sequala of inflammatory or infectious process. No new findings from prior exam. 2. Emphysema. Electronically signed by: Waddell Calk MD 06/03/2024 07:27 AM EDT RP Workstation: [id number]   DG Chest Port 1 View Result Date: 05/30/2024 EXAM: 1  VIEW XRAY OF THE CHEST 05/30/2024 08:03:00 AM COMPARISON: 05/28/2024 CLINICAL HISTORY: SOB (shortness of breath) FINDINGS: LUNGS AND PLEURA: Emphysematous changes. Chronic interstitial changes. Stable patchy opacities in left upper lung. No pleural effusion. No pneumothorax. HEART AND MEDIASTINUM: Atherosclerotic calcifications of aortic arch. No acute abnormality of the cardiac and mediastinal silhouettes. BONES AND SOFT TISSUES: No acute osseous abnormality. IMPRESSION: 1. Stable patchy opacities in the left upper lung. 2. Emphysematous changes and chronic interstitial changes. Electronically signed by: Waddell Calk MD  05/30/2024 08:34 AM EDT RP Workstation: [id number]   DG Chest Port 1 View Result Date: 05/28/2024 EXAM: 1 VIEW XRAY OF THE CHEST 05/28/2024 07:01:00 AM COMPARISON: 05/27/2024 CLINICAL HISTORY: [id number] SOB (shortness of breath) [id number]. Reason for exam: pt order states SOB (shortness of breath) SOB (shortness of breath) FINDINGS: LUNGS AND PLEURA: Chronic parenchymal disease and left upper lung opacities. No acute findings. HEART AND MEDIASTINUM: No acute abnormality of the cardiac and mediastinal silhouettes. Aortic atherosclerosis (ICD10-I70.0). BONES AND SOFT TISSUES: No acute osseous abnormality. Feeding tube extends at least as far as stomach, type not seen. IMPRESSION: 1. No acute findings. 2. Chronic parenchymal disease and left upper lung opacities. Electronically signed by: Katheleen Faes MD 05/28/2024 07:28 AM EDT RP Workstation: [id number]   DG Chest Port 1 View Result Date: 05/27/2024 CLINICAL DATA:  Respiratory failure. EXAM: PORTABLE CHEST 1 VIEW COMPARISON:  05/26/2024 FINDINGS: Lungs are hyperexpanded. Chronic interstitial changes again noted with areas of architectural distortion in the upper lungs bilaterally and left base. No dense focal consolidative disease. No substantial pleural effusion. The cardiopericardial silhouette is within normal limits for size. A feeding tube passes into the stomach although the distal tip position is not included on the film. Telemetry leads overlie the chest. IMPRESSION: Hyperexpansion with chronic interstitial changes and areas of architectural distortion in the upper lungs bilaterally and left base. No substantial interval change. Electronically Signed   By: Camellia Candle M.D.   On: 05/27/2024 06:20   DG Chest Port 1 View Result Date: 05/26/2024 CLINICAL DATA:  78 year old male with respiratory failure. EXAM: PORTABLE CHEST 1 VIEW COMPARISON:  Portable chest 05/21/2024 and earlier. FINDINGS: Portable AP semi upright view at 0426 hours. Enteric feeding tube  courses to the abdomen, tip not included. Large lung volumes. Stable cardiac size and mediastinal contours. Calcified aortic atherosclerosis. Asymmetric coarse bilateral pulmonary interstitial opacity, emphysema and chronic interstitial lung disease demonstrated by CT in 2024. No pneumothorax, pleural effusion or confluent lung opacity. Stable ventilation since 05/17/2024. Stable visualized osseous structures. IMPRESSION: Chronic lung disease appears stable. No acute cardiopulmonary abnormality. Emphysema (ICD10-J43.9). Electronically Signed   By: VEAR Hurst M.D.   On: 05/26/2024 08:49   DG CHEST PORT 1 VIEW Result Date: 05/21/2024 CLINICAL DATA:  Acute respiratory failure. EXAM: PORTABLE CHEST 1 VIEW COMPARISON:  05/17/2024 FINDINGS: Significant patient rotation. Endotracheal tube tip is below the clavicles. Weighted enteric tube tip below the diaphragm not included in the field of view. The lungs are hyperinflated with bronchial thickening. Ill-defined left upper lobe opacity with improvement from prior exam. There is chronic basilar scarring. No new airspace disease. The heart is normal in size, mediastinal contours difficult to assess due to rotation. No pneumothorax. IMPRESSION: 1. Rotated exam.  Grossly stable support apparatus. 2. Ill-defined left upper lobe opacity with improvement from prior exam. 3. Hyperinflation with bronchial thickening. Electronically Signed   By: Andrea Gasman M.D.   On: 05/21/2024 15:57   CT PELVIS W CONTRAST Result Date: 05/18/2024 CLINICAL DATA:  Concern for Fournier's gangrene. EXAM: CT PELVIS WITH CONTRAST TECHNIQUE: Multidetector CT imaging of the pelvis was performed using the standard protocol following the bolus administration of intravenous contrast. RADIATION DOSE REDUCTION: This exam was performed according to the departmental dose-optimization program which includes automated exposure control, adjustment of the mA and/or kV according to patient size and/or use of  iterative reconstruction technique. CONTRAST:  75mL OMNIPAQUE  IOHEXOL  350 MG/ML SOLN COMPARISON:  None Available. FINDINGS: Urinary Tract: Complex right renal cyst containing a thin septation is partially imaged measuring up to 7 cm. There is mild posterior left bladder wall thickening. No bladder calculi are seen. Bowel: Rectal tube in place. There is sigmoid colon diverticulosis. The appendix is normal. No dilated bowel loops are visualized. Vascular/Lymphatic: There is occlusion of the visualized right superficial femoral artery. Otherwise, pelvic arterial structures appear patent. There is severe atherosclerotic disease present. No enlarged lymph nodes are seen. Reproductive: There is a 9.4 x 3.5 by 6.8 cm fluid collection in the inferior right perineum extending to the level of the testicles. This abuts the base of the penis and is inferior to the pelvic floor. There is diffuse scrotal wall edema. Other: There is a small fat containing right inguinal hernia. There is edema and swelling in the right inguinal region. Musculoskeletal: Degenerative changes affect the spine. IMPRESSION: 1. 9.4 x 3.5 x 6.8 cm fluid collection in the inferior right perineum extending to the level of the testicles. This is worrisome for abscess. 2. Diffuse scrotal wall edema. 3. Mild posterior left bladder wall thickening. Correlate clinically for cystitis. 4. Occlusion of the visualized right superficial femoral artery. 5. Complex right renal cyst measuring up to 7 cm. Recommend further evaluation with renal ultrasound. 6. Small fat containing right inguinal hernia. Aortic Atherosclerosis (ICD10-I70.0). Electronically Signed   By: Greig Pique M.D.   On: 05/18/2024 23:28   DG Chest Port 1 View Result Date: 05/17/2024 CLINICAL DATA:  Ventilator dependence. EXAM: PORTABLE CHEST 1 VIEW COMPARISON:  05/15/2024 FINDINGS: Endotracheal tube tip is approximately 5 cm above the base of the carina. A feeding tube passes into the stomach  although the distal tip position is not included on the film. Lungs are hyperexpanded. Diffuse chronic interstitial opacity is associated with patchy parenchymal opacity in the left upper lung, similar to prior. Scarring or trace effusion noted at the left base. Telemetry leads overlie the chest. IMPRESSION: 1. Endotracheal tube tip is approximately 5 cm above the base of the carina. 2. Chronic interstitial opacity with patchy parenchymal opacity in the left upper lung, similar to prior. Pneumonia in the left upper lung is not excluded. Electronically Signed   By: Camellia Candle M.D.   On: 05/17/2024 09:00     Discharge Exam: Vitals:   06/15/24 0002 06/15/24 0518  BP: 126/67 (!) 140/74  Pulse: 66 75  Resp: 15 20  Temp: 98 F (36.7 C) 97.7 F (36.5 C)  SpO2: 96% 99%   Vitals:   06/14/24 1932 06/15/24 0002 06/15/24 0500 06/15/24 0518  BP: 125/62 126/67  (!) 140/74  Pulse: 91 66  75  Resp: 20 15  20   Temp: 98.3 F (36.8 C) 98 F (36.7 C)  97.7 F (36.5 C)  TempSrc: Oral Oral  Oral  SpO2: 91% 96%  99%  Weight:   56.2 kg   Height:        General: Pt is alert, awake, not in acute distress Cardiovascular: RRR, S1/S2 +, no rubs, no gallops Respiratory: CTA bilaterally, no  wheezing, no rhonchi Abdominal: Soft, NT, ND, bowel sounds + Extremities: no edema, no cyanosis    The results of significant diagnostics from this hospitalization (including imaging, microbiology, ancillary and laboratory) are listed below for reference.     Microbiology: Recent Results (from the past 240 hours)  Culture, blood (Routine X 2) w Reflex to ID Panel     Status: None   Collection Time: 06/09/24  9:04 PM   Specimen: BLOOD  Result Value Ref Range Status   Specimen Description BLOOD BLOOD LEFT ARM  Final   Special Requests   Final    BOTTLES DRAWN AEROBIC AND ANAEROBIC Blood Culture adequate volume   Culture   Final    NO GROWTH 5 DAYS Performed at [facility], [address],  [place], [place] [address]    Report Status 06/14/2024 FINAL  Final  Culture, blood (Routine X 2) w Reflex to ID Panel     Status: None   Collection Time: 06/09/24  9:04 PM   Specimen: BLOOD  Result Value Ref Range Status   Specimen Description BLOOD BLOOD RIGHT HAND  Final   Special Requests   Final    BOTTLES DRAWN AEROBIC ONLY Blood Culture adequate volume   Culture   Final    NO GROWTH 5 DAYS Performed at [facility], [address], [place], [place] [address]    Report Status 06/14/2024 FINAL  Final  MRSA Next Gen by PCR, Nasal     Status: Abnormal   Collection Time: 06/12/24 10:40 AM   Specimen: Nasal Mucosa; Nasal Swab  Result Value Ref Range Status   MRSA by PCR Next Gen DETECTED (A) NOT DETECTED Final    Comment: RESULT CALLED TO, READ BACK BY AND VERIFIED WITH: RN WADDELL DEL ON [id number] @1600  BY SM (NOTE) The GeneXpert MRSA Assay (FDA approved for NASAL specimens only), is one component of a comprehensive MRSA colonization surveillance program. It is not intended to diagnose MRSA infection nor to guide or monitor treatment for MRSA infections. Test performance is not FDA approved in patients less than 2 years old. Performed at [facility], [address], [place], [place] [address]      Labs: BNP (last 3 results) Recent Labs    06/08/24 0501 06/09/24 2104 06/13/24 2223  BNP 53.4 59.8 65.5   Basic Metabolic Panel: Recent Labs  Lab 06/09/24 0527 06/09/24 1753 06/09/24 1808 06/09/24 1828 06/10/24 0439 06/10/24 2144 06/11/24 0338 06/12/24 1106 06/13/24 1019  NA 138 137 134* 136 137  --  136 136 139  K 4.5 5.6* 6.8* 5.4* 5.2* 5.1 4.7 4.5 4.7  CL 96* 97* 101  --  99  --  98 98 100  CO2 32 29  --   --  26  --  24 28 29   GLUCOSE 166* 137* 142*  --  207*  --  187* 279* 120*  BUN 57* 62* 90*  --  60*  --  54* 44* 46*  CREATININE 2.24* 2.28* 2.40*  --  2.14*  --  2.09* 2.54* 2.15*  CALCIUM  8.8* 8.8*  --   --  8.3*  --  8.6* 8.4* 8.9  MG 1.9  --   --    --  1.9  --   --   --   --   PHOS  --   --   --   --  4.0  --   --   --   --  Liver Function Tests: Recent Labs  Lab 06/09/24 1753  AST 51*  ALT 62*  ALKPHOS 112  BILITOT 0.6  PROT 7.2  ALBUMIN 2.4*   No results for input(s): LIPASE, AMYLASE in the last 168 hours. No results for input(s): AMMONIA in the last 168 hours. CBC: Recent Labs  Lab 06/09/24 0527 06/09/24 1753 06/09/24 1808 06/09/24 1828 06/10/24 0439 06/11/24 0338  WBC 5.3 5.9  --   --  5.1 7.0  NEUTROABS 3.5 3.4  --   --   --  5.3  HGB 10.1* 11.7* 12.9* 10.9* 9.5* 9.4*  HCT 33.2* 40.4 38.0* 32.0* 32.0* 31.3*  MCV 88.1 93.3  --   --  89.4 89.7  PLT 139* 134*  --   --  130* 138*   Cardiac Enzymes: No results for input(s): CKTOTAL, CKMB, CKMBINDEX, TROPONINI in the last 168 hours. BNP: Invalid input(s): POCBNP CBG: Recent Labs  Lab 06/13/24 2118 06/14/24 0749 06/14/24 1143 06/14/24 1558 06/14/24 2143  GLUCAP 143* 114* 215* 139* 198*   D-Dimer No results for input(s): DDIMER in the last 72 hours. Hgb A1c No results for input(s): HGBA1C in the last 72 hours. Lipid Profile No results for input(s): CHOL, HDL, LDLCALC, TRIG, CHOLHDL, LDLDIRECT in the last 72 hours. Thyroid  function studies No results for input(s): TSH, T4TOTAL, T3FREE, THYROIDAB in the last 72 hours.  Invalid input(s): FREET3 Anemia work up No results for input(s): VITAMINB12, FOLATE, FERRITIN, TIBC, IRON , RETICCTPCT in the last 72 hours. Urinalysis    Component Value Date/Time   COLORURINE AMBER (A) 05/29/2024 0102   APPEARANCEUR CLEAR 05/29/2024 0102   LABSPEC 1.016 05/29/2024 0102   PHURINE 6.0 05/29/2024 0102   GLUCOSEU NEGATIVE 05/29/2024 0102   HGBUR SMALL (A) 05/29/2024 0102   BILIRUBINUR NEGATIVE 05/29/2024 0102   KETONESUR NEGATIVE 05/29/2024 0102   PROTEINUR NEGATIVE 05/29/2024 0102   NITRITE NEGATIVE 05/29/2024 0102   LEUKOCYTESUR NEGATIVE 05/29/2024 0102    Sepsis Labs Recent Labs  Lab 06/09/24 0527 06/09/24 1753 06/10/24 0439 06/11/24 0338  WBC 5.3 5.9 5.1 7.0   Microbiology Recent Results (from the past 240 hours)  Culture, blood (Routine X 2) w Reflex to ID Panel     Status: None   Collection Time: 06/09/24  9:04 PM   Specimen: BLOOD  Result Value Ref Range Status   Specimen Description BLOOD BLOOD LEFT ARM  Final   Special Requests   Final    BOTTLES DRAWN AEROBIC AND ANAEROBIC Blood Culture adequate volume   Culture   Final    NO GROWTH 5 DAYS Performed at [facility], [address], [place], [place] [address]    Report Status 06/14/2024 FINAL  Final  Culture, blood (Routine X 2) w Reflex to ID Panel     Status: None   Collection Time: 06/09/24  9:04 PM   Specimen: BLOOD  Result Value Ref Range Status   Specimen Description BLOOD BLOOD RIGHT HAND  Final   Special Requests   Final    BOTTLES DRAWN AEROBIC ONLY Blood Culture adequate volume   Culture   Final    NO GROWTH 5 DAYS Performed at [facility], [address], [place], [place] [address]    Report Status 06/14/2024 FINAL  Final  MRSA Next Gen by PCR, Nasal     Status: Abnormal   Collection Time: 06/12/24 10:40 AM   Specimen: Nasal Mucosa; Nasal Swab  Result Value Ref Range Status   MRSA by PCR Next  Gen DETECTED (A) NOT DETECTED Final    Comment: RESULT CALLED TO, READ BACK BY AND VERIFIED WITH: RN WADDELL DEL ON [id number] @1600  BY SM (NOTE) The GeneXpert MRSA Assay (FDA approved for NASAL specimens only), is one component of a comprehensive MRSA colonization surveillance program. It is not intended to diagnose MRSA infection nor to guide or monitor treatment for MRSA infections. Test performance is not FDA approved in patients less than 89 years old. Performed at [facility], [address], [place], [place] [address]     FURTHER DISCHARGE INSTRUCTIONS:   Get Medicines reviewed and adjusted: Please take all your medications with you  for your next visit with your Primary MD   Laboratory/radiological data: Please request your Primary MD to go over all hospital tests and procedure/radiological results at the follow up, please ask your Primary MD to get all Hospital records sent to his/her office.   In some cases, they will be blood work, cultures and biopsy results pending at the time of your discharge. Please request that your primary care M.D. goes through all the records of your hospital data and follows up on these results.   Also Note the following: If you experience worsening of your admission symptoms, develop shortness of breath, life threatening emergency, suicidal or homicidal thoughts you must seek medical attention immediately by calling 911 or calling your MD immediately  if symptoms less severe.   You must read complete instructions/literature along with all the possible adverse reactions/side effects for all the Medicines you take and that have been prescribed to you. Take any new Medicines after you have completely understood and accpet all the possible adverse reactions/side effects.    patient was instructed, not to drive, operate heavy machinery, perform activities at heights, swimming or participation in water  activities or provide baby-sitting services while on Pain, Sleep and Anxiety Medications; until their outpatient Physician has advised to do so again. Also recommended to not to take more than prescribed Pain, Sleep and Anxiety Medications.  It is not advisable to combine anxiety, sleep and pain medications without talking with your primary care provider.     Wear Seat belts while driving.   Please note: You were cared for by a hospitalist during your hospital stay. Once you are discharged, your primary care physician will handle any further medical issues. Please note that NO REFILLS for any discharge medications will be authorized once you are discharged, as it is imperative that you return to your  primary care physician (or establish a relationship with a primary care physician if you do not have one) for your post hospital discharge needs so that they can reassess your need for medications and monitor your lab values  Time coordinating discharge: Over 30 minutes  SIGNED:   Fredia Skeeter, MD  [facility] 06/15/2024, 7:46 AM *Please note that this is a verbal dictation therefore any spelling or grammatical errors are due to the Dragon Medical One system interpretation. If 7PM-7AM, please contact night-coverage [email or web address]

## 2024-06-15 NOTE — TOC Transition Note (Signed)
 Transition of Care [facility]) - Discharge Note   Patient Details  Name: Clarence Maldonado MRN: [id number] Date of Birth: [birth year]  [facility] CM/SW Contact:  Isaiah Public, LCSWA Phone Number: 06/15/2024, 10:58 AM   Clinical Narrative:     Patient will DC to: [facility]   Anticipated DC date: 06/15/2024  Family notified: Norleen  Transport by: [facility]  ?  Per MD patient ready for DC to [facility] SN[facility] . RN, patient, patient's family, and facility notified of DC. Discharge Summary sent to facility. RN given number for report [phone] RM# 235. DC packet on chart. Ambulance transport requested for patient.  CSW signing off.   Final next level of care: Skilled Nursing Facility Barriers to Discharge: No Barriers Identified   Patient Goals and CMS Choice Patient states their goals for this hospitalization and ongoing recovery are:: SNF CMS Medicare.gov Compare Post Acute Care list provided to:: Patient Represenative (must comment) Vilinda Novak, relative) Choice offered to / list presented to :  (relative Norleen)      Discharge Placement              Patient [facility] bed at: [facility] Patient to be transferred to facility by: [facility] Name of family member notified: relative [name] Patient and family notified of of transfer: 06/15/24  Discharge Plan and Services Additional resources added to the After Visit Summary for   In-house Referral: Clinical Social Work   Post Acute Care Choice: NA                               Social Drivers of Health (SDOH) Interventions SDOH Screenings   Food Insecurity: No Food Insecurity (06/10/2024)  Housing: Low Risk  (06/10/2024)  Transportation Needs: No Transportation Needs (06/10/2024)  Utilities: Not At Risk (06/10/2024)  Depression (PHQ2-9): Low Risk  (10/14/2023)  Financial Resource Strain: Low Risk  (03/23/2019)  Physical Activity: Inactive (03/23/2019)  Social Connections: Socially Isolated (06/10/2024)  Stress: No Stress  Concern Present (03/23/2019)  Tobacco Use: Medium Risk (06/09/2024)     Readmission Risk Interventions    06/08/2024    3:02 PM  Readmission Risk Prevention Plan  Transportation Screening Complete  Medication Review (RN Care Manager) Complete  PCP or Specialist appointment within 3-5 days of discharge Complete  HRI or Home Care Consult Complete  SW Recovery Care/Counseling Consult Complete  Palliative Care Screening Not Applicable  Skilled Nursing Facility Complete

## 2024-06-16 DIAGNOSIS — J449 Chronic obstructive pulmonary disease, unspecified: Secondary | ICD-10-CM | POA: Diagnosis not present

## 2024-06-16 DIAGNOSIS — R1312 Dysphagia, oropharyngeal phase: Secondary | ICD-10-CM | POA: Diagnosis not present

## 2024-06-16 DIAGNOSIS — F0150 Vascular dementia without behavioral disturbance: Secondary | ICD-10-CM | POA: Diagnosis not present

## 2024-06-16 DIAGNOSIS — M6281 Muscle weakness (generalized): Secondary | ICD-10-CM | POA: Diagnosis not present

## 2024-06-16 DIAGNOSIS — R41841 Cognitive communication deficit: Secondary | ICD-10-CM | POA: Diagnosis not present

## 2024-06-16 DIAGNOSIS — M7989 Other specified soft tissue disorders: Secondary | ICD-10-CM | POA: Diagnosis not present

## 2024-06-16 DIAGNOSIS — Z9981 Dependence on supplemental oxygen: Secondary | ICD-10-CM | POA: Diagnosis not present

## 2024-06-16 DIAGNOSIS — R531 Weakness: Secondary | ICD-10-CM | POA: Diagnosis not present

## 2024-06-16 DIAGNOSIS — I509 Heart failure, unspecified: Secondary | ICD-10-CM | POA: Diagnosis not present

## 2024-06-16 DIAGNOSIS — Z7409 Other reduced mobility: Secondary | ICD-10-CM | POA: Diagnosis not present

## 2024-06-16 DIAGNOSIS — Z89611 Acquired absence of right leg above knee: Secondary | ICD-10-CM | POA: Diagnosis not present

## 2024-06-16 DIAGNOSIS — J9610 Chronic respiratory failure, unspecified whether with hypoxia or hypercapnia: Secondary | ICD-10-CM | POA: Diagnosis not present

## 2024-06-17 DIAGNOSIS — R531 Weakness: Secondary | ICD-10-CM | POA: Diagnosis not present

## 2024-06-17 DIAGNOSIS — J449 Chronic obstructive pulmonary disease, unspecified: Secondary | ICD-10-CM | POA: Diagnosis not present

## 2024-06-17 DIAGNOSIS — F419 Anxiety disorder, unspecified: Secondary | ICD-10-CM | POA: Diagnosis not present

## 2024-06-17 DIAGNOSIS — M6281 Muscle weakness (generalized): Secondary | ICD-10-CM | POA: Diagnosis not present

## 2024-06-17 DIAGNOSIS — R1312 Dysphagia, oropharyngeal phase: Secondary | ICD-10-CM | POA: Diagnosis not present

## 2024-06-17 DIAGNOSIS — Z9981 Dependence on supplemental oxygen: Secondary | ICD-10-CM | POA: Diagnosis not present

## 2024-06-17 DIAGNOSIS — J9610 Chronic respiratory failure, unspecified whether with hypoxia or hypercapnia: Secondary | ICD-10-CM | POA: Diagnosis not present

## 2024-06-17 DIAGNOSIS — R41841 Cognitive communication deficit: Secondary | ICD-10-CM | POA: Diagnosis not present

## 2024-06-17 DIAGNOSIS — Z7409 Other reduced mobility: Secondary | ICD-10-CM | POA: Diagnosis not present

## 2024-06-18 DIAGNOSIS — R0600 Dyspnea, unspecified: Secondary | ICD-10-CM | POA: Diagnosis not present

## 2024-06-18 DIAGNOSIS — J9610 Chronic respiratory failure, unspecified whether with hypoxia or hypercapnia: Secondary | ICD-10-CM | POA: Diagnosis not present

## 2024-06-18 DIAGNOSIS — Z7409 Other reduced mobility: Secondary | ICD-10-CM | POA: Diagnosis not present

## 2024-06-18 DIAGNOSIS — F410 Panic disorder [episodic paroxysmal anxiety] without agoraphobia: Secondary | ICD-10-CM | POA: Diagnosis not present

## 2024-06-18 DIAGNOSIS — J449 Chronic obstructive pulmonary disease, unspecified: Secondary | ICD-10-CM | POA: Diagnosis not present

## 2024-06-18 DIAGNOSIS — F419 Anxiety disorder, unspecified: Secondary | ICD-10-CM | POA: Diagnosis not present

## 2024-06-18 DIAGNOSIS — R531 Weakness: Secondary | ICD-10-CM | POA: Diagnosis not present

## 2024-06-18 DIAGNOSIS — M6281 Muscle weakness (generalized): Secondary | ICD-10-CM | POA: Diagnosis not present

## 2024-06-18 DIAGNOSIS — Z9981 Dependence on supplemental oxygen: Secondary | ICD-10-CM | POA: Diagnosis not present

## 2024-06-18 DIAGNOSIS — R41841 Cognitive communication deficit: Secondary | ICD-10-CM | POA: Diagnosis not present

## 2024-06-18 DIAGNOSIS — R1312 Dysphagia, oropharyngeal phase: Secondary | ICD-10-CM | POA: Diagnosis not present

## 2024-06-19 ENCOUNTER — Encounter (HOSPITAL_COMMUNITY)

## 2024-06-19 ENCOUNTER — Ambulatory Visit

## 2024-06-19 DIAGNOSIS — R1312 Dysphagia, oropharyngeal phase: Secondary | ICD-10-CM | POA: Diagnosis not present

## 2024-06-19 DIAGNOSIS — J449 Chronic obstructive pulmonary disease, unspecified: Secondary | ICD-10-CM | POA: Diagnosis not present

## 2024-06-19 DIAGNOSIS — F419 Anxiety disorder, unspecified: Secondary | ICD-10-CM | POA: Diagnosis not present

## 2024-06-19 DIAGNOSIS — M6281 Muscle weakness (generalized): Secondary | ICD-10-CM | POA: Diagnosis not present

## 2024-06-19 DIAGNOSIS — R531 Weakness: Secondary | ICD-10-CM | POA: Diagnosis not present

## 2024-06-19 DIAGNOSIS — F410 Panic disorder [episodic paroxysmal anxiety] without agoraphobia: Secondary | ICD-10-CM | POA: Diagnosis not present

## 2024-06-19 DIAGNOSIS — J9610 Chronic respiratory failure, unspecified whether with hypoxia or hypercapnia: Secondary | ICD-10-CM | POA: Diagnosis not present

## 2024-06-19 DIAGNOSIS — Z9981 Dependence on supplemental oxygen: Secondary | ICD-10-CM | POA: Diagnosis not present

## 2024-06-19 DIAGNOSIS — R41841 Cognitive communication deficit: Secondary | ICD-10-CM | POA: Diagnosis not present

## 2024-06-19 DIAGNOSIS — Z7409 Other reduced mobility: Secondary | ICD-10-CM | POA: Diagnosis not present

## 2024-06-20 DIAGNOSIS — Z7409 Other reduced mobility: Secondary | ICD-10-CM | POA: Diagnosis not present

## 2024-06-20 DIAGNOSIS — R229 Localized swelling, mass and lump, unspecified: Secondary | ICD-10-CM | POA: Diagnosis not present

## 2024-06-20 DIAGNOSIS — R41841 Cognitive communication deficit: Secondary | ICD-10-CM | POA: Diagnosis not present

## 2024-06-20 DIAGNOSIS — I509 Heart failure, unspecified: Secondary | ICD-10-CM | POA: Diagnosis not present

## 2024-06-20 DIAGNOSIS — Z9981 Dependence on supplemental oxygen: Secondary | ICD-10-CM | POA: Diagnosis not present

## 2024-06-20 DIAGNOSIS — I130 Hypertensive heart and chronic kidney disease with heart failure and stage 1 through stage 4 chronic kidney disease, or unspecified chronic kidney disease: Secondary | ICD-10-CM | POA: Diagnosis not present

## 2024-06-20 DIAGNOSIS — R1312 Dysphagia, oropharyngeal phase: Secondary | ICD-10-CM | POA: Diagnosis not present

## 2024-06-20 DIAGNOSIS — M6281 Muscle weakness (generalized): Secondary | ICD-10-CM | POA: Diagnosis not present

## 2024-06-20 DIAGNOSIS — J9610 Chronic respiratory failure, unspecified whether with hypoxia or hypercapnia: Secondary | ICD-10-CM | POA: Diagnosis not present

## 2024-06-20 DIAGNOSIS — J449 Chronic obstructive pulmonary disease, unspecified: Secondary | ICD-10-CM | POA: Diagnosis not present

## 2024-06-21 DIAGNOSIS — Z8673 Personal history of transient ischemic attack (TIA), and cerebral infarction without residual deficits: Secondary | ICD-10-CM | POA: Diagnosis not present

## 2024-06-21 DIAGNOSIS — Z8701 Personal history of pneumonia (recurrent): Secondary | ICD-10-CM | POA: Diagnosis not present

## 2024-06-21 DIAGNOSIS — R1312 Dysphagia, oropharyngeal phase: Secondary | ICD-10-CM | POA: Diagnosis not present

## 2024-06-21 DIAGNOSIS — J449 Chronic obstructive pulmonary disease, unspecified: Secondary | ICD-10-CM | POA: Diagnosis not present

## 2024-06-21 DIAGNOSIS — F0390 Unspecified dementia without behavioral disturbance: Secondary | ICD-10-CM | POA: Diagnosis not present

## 2024-06-21 DIAGNOSIS — Z89611 Acquired absence of right leg above knee: Secondary | ICD-10-CM | POA: Diagnosis not present

## 2024-06-21 DIAGNOSIS — J9690 Respiratory failure, unspecified, unspecified whether with hypoxia or hypercapnia: Secondary | ICD-10-CM | POA: Diagnosis not present

## 2024-06-21 DIAGNOSIS — R41841 Cognitive communication deficit: Secondary | ICD-10-CM | POA: Diagnosis not present

## 2024-06-21 DIAGNOSIS — M6281 Muscle weakness (generalized): Secondary | ICD-10-CM | POA: Diagnosis not present

## 2024-06-21 DIAGNOSIS — E119 Type 2 diabetes mellitus without complications: Secondary | ICD-10-CM | POA: Diagnosis not present

## 2024-06-21 DIAGNOSIS — F32A Depression, unspecified: Secondary | ICD-10-CM | POA: Diagnosis not present

## 2024-06-22 DIAGNOSIS — F419 Anxiety disorder, unspecified: Secondary | ICD-10-CM | POA: Diagnosis not present

## 2024-06-22 DIAGNOSIS — F0390 Unspecified dementia without behavioral disturbance: Secondary | ICD-10-CM | POA: Diagnosis not present

## 2024-06-22 DIAGNOSIS — J9610 Chronic respiratory failure, unspecified whether with hypoxia or hypercapnia: Secondary | ICD-10-CM | POA: Diagnosis not present

## 2024-06-22 DIAGNOSIS — I5020 Unspecified systolic (congestive) heart failure: Secondary | ICD-10-CM | POA: Diagnosis not present

## 2024-06-22 DIAGNOSIS — M6281 Muscle weakness (generalized): Secondary | ICD-10-CM | POA: Diagnosis not present

## 2024-06-22 DIAGNOSIS — F339 Major depressive disorder, recurrent, unspecified: Secondary | ICD-10-CM | POA: Diagnosis not present

## 2024-06-22 DIAGNOSIS — D638 Anemia in other chronic diseases classified elsewhere: Secondary | ICD-10-CM | POA: Diagnosis not present

## 2024-06-22 DIAGNOSIS — I10 Essential (primary) hypertension: Secondary | ICD-10-CM | POA: Diagnosis not present

## 2024-06-22 DIAGNOSIS — Z9981 Dependence on supplemental oxygen: Secondary | ICD-10-CM | POA: Diagnosis not present

## 2024-06-22 DIAGNOSIS — R1312 Dysphagia, oropharyngeal phase: Secondary | ICD-10-CM | POA: Diagnosis not present

## 2024-06-22 DIAGNOSIS — R41841 Cognitive communication deficit: Secondary | ICD-10-CM | POA: Diagnosis not present

## 2024-06-22 DIAGNOSIS — Z7409 Other reduced mobility: Secondary | ICD-10-CM | POA: Diagnosis not present

## 2024-06-22 DIAGNOSIS — E119 Type 2 diabetes mellitus without complications: Secondary | ICD-10-CM | POA: Diagnosis not present

## 2024-06-22 DIAGNOSIS — J449 Chronic obstructive pulmonary disease, unspecified: Secondary | ICD-10-CM | POA: Diagnosis not present

## 2024-06-23 DIAGNOSIS — R1312 Dysphagia, oropharyngeal phase: Secondary | ICD-10-CM | POA: Diagnosis not present

## 2024-06-23 DIAGNOSIS — R41841 Cognitive communication deficit: Secondary | ICD-10-CM | POA: Diagnosis not present

## 2024-06-23 DIAGNOSIS — J449 Chronic obstructive pulmonary disease, unspecified: Secondary | ICD-10-CM | POA: Diagnosis not present

## 2024-06-23 DIAGNOSIS — M6281 Muscle weakness (generalized): Secondary | ICD-10-CM | POA: Diagnosis not present

## 2024-06-25 DIAGNOSIS — M6281 Muscle weakness (generalized): Secondary | ICD-10-CM | POA: Diagnosis not present

## 2024-06-25 DIAGNOSIS — R41841 Cognitive communication deficit: Secondary | ICD-10-CM | POA: Diagnosis not present

## 2024-06-25 DIAGNOSIS — R1312 Dysphagia, oropharyngeal phase: Secondary | ICD-10-CM | POA: Diagnosis not present

## 2024-06-25 DIAGNOSIS — J449 Chronic obstructive pulmonary disease, unspecified: Secondary | ICD-10-CM | POA: Diagnosis not present

## 2024-06-26 DIAGNOSIS — L98492 Non-pressure chronic ulcer of skin of other sites with fat layer exposed: Secondary | ICD-10-CM | POA: Diagnosis not present

## 2024-06-26 DIAGNOSIS — M6281 Muscle weakness (generalized): Secondary | ICD-10-CM | POA: Diagnosis not present

## 2024-06-26 DIAGNOSIS — R41841 Cognitive communication deficit: Secondary | ICD-10-CM | POA: Diagnosis not present

## 2024-06-26 DIAGNOSIS — J449 Chronic obstructive pulmonary disease, unspecified: Secondary | ICD-10-CM | POA: Diagnosis not present

## 2024-06-26 DIAGNOSIS — R1312 Dysphagia, oropharyngeal phase: Secondary | ICD-10-CM | POA: Diagnosis not present

## 2024-06-27 DIAGNOSIS — R1312 Dysphagia, oropharyngeal phase: Secondary | ICD-10-CM | POA: Diagnosis not present

## 2024-06-27 DIAGNOSIS — M6281 Muscle weakness (generalized): Secondary | ICD-10-CM | POA: Diagnosis not present

## 2024-06-27 DIAGNOSIS — J449 Chronic obstructive pulmonary disease, unspecified: Secondary | ICD-10-CM | POA: Diagnosis not present

## 2024-06-27 DIAGNOSIS — R41841 Cognitive communication deficit: Secondary | ICD-10-CM | POA: Diagnosis not present

## 2024-06-28 DIAGNOSIS — J9610 Chronic respiratory failure, unspecified whether with hypoxia or hypercapnia: Secondary | ICD-10-CM | POA: Diagnosis not present

## 2024-06-28 DIAGNOSIS — M6281 Muscle weakness (generalized): Secondary | ICD-10-CM | POA: Diagnosis not present

## 2024-06-28 DIAGNOSIS — I5022 Chronic systolic (congestive) heart failure: Secondary | ICD-10-CM | POA: Diagnosis not present

## 2024-06-28 DIAGNOSIS — R41841 Cognitive communication deficit: Secondary | ICD-10-CM | POA: Diagnosis not present

## 2024-06-28 DIAGNOSIS — Z7409 Other reduced mobility: Secondary | ICD-10-CM | POA: Diagnosis not present

## 2024-06-28 DIAGNOSIS — J449 Chronic obstructive pulmonary disease, unspecified: Secondary | ICD-10-CM | POA: Diagnosis not present

## 2024-06-28 DIAGNOSIS — Z9981 Dependence on supplemental oxygen: Secondary | ICD-10-CM | POA: Diagnosis not present

## 2024-06-28 DIAGNOSIS — F419 Anxiety disorder, unspecified: Secondary | ICD-10-CM | POA: Diagnosis not present

## 2024-06-28 DIAGNOSIS — R1312 Dysphagia, oropharyngeal phase: Secondary | ICD-10-CM | POA: Diagnosis not present

## 2024-06-29 DIAGNOSIS — R1312 Dysphagia, oropharyngeal phase: Secondary | ICD-10-CM | POA: Diagnosis not present

## 2024-06-29 DIAGNOSIS — F0150 Vascular dementia without behavioral disturbance: Secondary | ICD-10-CM | POA: Diagnosis not present

## 2024-06-29 DIAGNOSIS — I10 Essential (primary) hypertension: Secondary | ICD-10-CM | POA: Diagnosis not present

## 2024-06-29 DIAGNOSIS — J449 Chronic obstructive pulmonary disease, unspecified: Secondary | ICD-10-CM | POA: Diagnosis not present

## 2024-06-29 DIAGNOSIS — Z9981 Dependence on supplemental oxygen: Secondary | ICD-10-CM | POA: Diagnosis not present

## 2024-06-29 DIAGNOSIS — D649 Anemia, unspecified: Secondary | ICD-10-CM | POA: Diagnosis not present

## 2024-06-29 DIAGNOSIS — G3184 Mild cognitive impairment, so stated: Secondary | ICD-10-CM | POA: Diagnosis not present

## 2024-06-29 DIAGNOSIS — F331 Major depressive disorder, recurrent, moderate: Secondary | ICD-10-CM | POA: Diagnosis not present

## 2024-06-29 DIAGNOSIS — E43 Unspecified severe protein-calorie malnutrition: Secondary | ICD-10-CM | POA: Diagnosis not present

## 2024-06-29 DIAGNOSIS — F411 Generalized anxiety disorder: Secondary | ICD-10-CM | POA: Diagnosis not present

## 2024-06-29 DIAGNOSIS — M6281 Muscle weakness (generalized): Secondary | ICD-10-CM | POA: Diagnosis not present

## 2024-06-29 DIAGNOSIS — F339 Major depressive disorder, recurrent, unspecified: Secondary | ICD-10-CM | POA: Diagnosis not present

## 2024-06-29 DIAGNOSIS — E119 Type 2 diabetes mellitus without complications: Secondary | ICD-10-CM | POA: Diagnosis not present

## 2024-06-29 DIAGNOSIS — R41841 Cognitive communication deficit: Secondary | ICD-10-CM | POA: Diagnosis not present

## 2024-06-30 DIAGNOSIS — J9610 Chronic respiratory failure, unspecified whether with hypoxia or hypercapnia: Secondary | ICD-10-CM | POA: Diagnosis not present

## 2024-06-30 DIAGNOSIS — J449 Chronic obstructive pulmonary disease, unspecified: Secondary | ICD-10-CM | POA: Diagnosis not present

## 2024-06-30 DIAGNOSIS — R531 Weakness: Secondary | ICD-10-CM | POA: Diagnosis not present

## 2024-06-30 DIAGNOSIS — F0150 Vascular dementia without behavioral disturbance: Secondary | ICD-10-CM | POA: Diagnosis not present

## 2024-06-30 DIAGNOSIS — R41841 Cognitive communication deficit: Secondary | ICD-10-CM | POA: Diagnosis not present

## 2024-06-30 DIAGNOSIS — M6281 Muscle weakness (generalized): Secondary | ICD-10-CM | POA: Diagnosis not present

## 2024-06-30 DIAGNOSIS — F419 Anxiety disorder, unspecified: Secondary | ICD-10-CM | POA: Diagnosis not present

## 2024-06-30 DIAGNOSIS — Z719 Counseling, unspecified: Secondary | ICD-10-CM | POA: Diagnosis not present

## 2024-06-30 DIAGNOSIS — R1312 Dysphagia, oropharyngeal phase: Secondary | ICD-10-CM | POA: Diagnosis not present

## 2024-06-30 DIAGNOSIS — R4702 Dysphasia: Secondary | ICD-10-CM | POA: Diagnosis not present

## 2024-06-30 DIAGNOSIS — Z9981 Dependence on supplemental oxygen: Secondary | ICD-10-CM | POA: Diagnosis not present

## 2024-06-30 DIAGNOSIS — R490 Dysphonia: Secondary | ICD-10-CM | POA: Diagnosis not present

## 2024-07-01 DIAGNOSIS — J449 Chronic obstructive pulmonary disease, unspecified: Secondary | ICD-10-CM | POA: Diagnosis not present

## 2024-07-01 DIAGNOSIS — M6281 Muscle weakness (generalized): Secondary | ICD-10-CM | POA: Diagnosis not present

## 2024-07-01 DIAGNOSIS — R41841 Cognitive communication deficit: Secondary | ICD-10-CM | POA: Diagnosis not present

## 2024-07-01 DIAGNOSIS — R1312 Dysphagia, oropharyngeal phase: Secondary | ICD-10-CM | POA: Diagnosis not present

## 2024-07-03 ENCOUNTER — Ambulatory Visit

## 2024-07-03 ENCOUNTER — Encounter (HOSPITAL_COMMUNITY)

## 2024-07-03 DIAGNOSIS — R1312 Dysphagia, oropharyngeal phase: Secondary | ICD-10-CM | POA: Diagnosis not present

## 2024-07-03 DIAGNOSIS — M6281 Muscle weakness (generalized): Secondary | ICD-10-CM | POA: Diagnosis not present

## 2024-07-03 DIAGNOSIS — J449 Chronic obstructive pulmonary disease, unspecified: Secondary | ICD-10-CM | POA: Diagnosis not present

## 2024-07-03 DIAGNOSIS — R41841 Cognitive communication deficit: Secondary | ICD-10-CM | POA: Diagnosis not present

## 2024-07-04 DIAGNOSIS — J449 Chronic obstructive pulmonary disease, unspecified: Secondary | ICD-10-CM | POA: Diagnosis not present

## 2024-07-04 DIAGNOSIS — M6281 Muscle weakness (generalized): Secondary | ICD-10-CM | POA: Diagnosis not present

## 2024-07-04 DIAGNOSIS — R1312 Dysphagia, oropharyngeal phase: Secondary | ICD-10-CM | POA: Diagnosis not present

## 2024-07-04 DIAGNOSIS — R41841 Cognitive communication deficit: Secondary | ICD-10-CM | POA: Diagnosis not present

## 2024-07-05 DIAGNOSIS — M6281 Muscle weakness (generalized): Secondary | ICD-10-CM | POA: Diagnosis not present

## 2024-07-05 DIAGNOSIS — R41841 Cognitive communication deficit: Secondary | ICD-10-CM | POA: Diagnosis not present

## 2024-07-05 DIAGNOSIS — R1312 Dysphagia, oropharyngeal phase: Secondary | ICD-10-CM | POA: Diagnosis not present

## 2024-07-05 DIAGNOSIS — Z4781 Encounter for orthopedic aftercare following surgical amputation: Secondary | ICD-10-CM | POA: Diagnosis not present

## 2024-07-05 DIAGNOSIS — I739 Peripheral vascular disease, unspecified: Secondary | ICD-10-CM | POA: Diagnosis not present

## 2024-07-05 DIAGNOSIS — J449 Chronic obstructive pulmonary disease, unspecified: Secondary | ICD-10-CM | POA: Diagnosis not present

## 2024-07-06 DIAGNOSIS — Z7409 Other reduced mobility: Secondary | ICD-10-CM | POA: Diagnosis not present

## 2024-07-06 DIAGNOSIS — R531 Weakness: Secondary | ICD-10-CM | POA: Diagnosis not present

## 2024-07-06 DIAGNOSIS — J449 Chronic obstructive pulmonary disease, unspecified: Secondary | ICD-10-CM | POA: Diagnosis not present

## 2024-07-06 DIAGNOSIS — I10 Essential (primary) hypertension: Secondary | ICD-10-CM | POA: Diagnosis not present

## 2024-07-06 DIAGNOSIS — R1312 Dysphagia, oropharyngeal phase: Secondary | ICD-10-CM | POA: Diagnosis not present

## 2024-07-06 DIAGNOSIS — M6281 Muscle weakness (generalized): Secondary | ICD-10-CM | POA: Diagnosis not present

## 2024-07-06 DIAGNOSIS — Z720 Tobacco use: Secondary | ICD-10-CM | POA: Diagnosis not present

## 2024-07-06 DIAGNOSIS — E119 Type 2 diabetes mellitus without complications: Secondary | ICD-10-CM | POA: Diagnosis not present

## 2024-07-06 DIAGNOSIS — F339 Major depressive disorder, recurrent, unspecified: Secondary | ICD-10-CM | POA: Diagnosis not present

## 2024-07-06 DIAGNOSIS — R41841 Cognitive communication deficit: Secondary | ICD-10-CM | POA: Diagnosis not present

## 2024-07-06 DIAGNOSIS — F0150 Vascular dementia without behavioral disturbance: Secondary | ICD-10-CM | POA: Diagnosis not present

## 2024-07-07 DIAGNOSIS — R41841 Cognitive communication deficit: Secondary | ICD-10-CM | POA: Diagnosis not present

## 2024-07-07 DIAGNOSIS — R1312 Dysphagia, oropharyngeal phase: Secondary | ICD-10-CM | POA: Diagnosis not present

## 2024-07-07 DIAGNOSIS — M6281 Muscle weakness (generalized): Secondary | ICD-10-CM | POA: Diagnosis not present

## 2024-07-08 DIAGNOSIS — M6281 Muscle weakness (generalized): Secondary | ICD-10-CM | POA: Diagnosis not present

## 2024-07-08 DIAGNOSIS — R1312 Dysphagia, oropharyngeal phase: Secondary | ICD-10-CM | POA: Diagnosis not present

## 2024-07-08 DIAGNOSIS — R41841 Cognitive communication deficit: Secondary | ICD-10-CM | POA: Diagnosis not present

## 2024-07-09 DIAGNOSIS — R1312 Dysphagia, oropharyngeal phase: Secondary | ICD-10-CM | POA: Diagnosis not present

## 2024-07-09 DIAGNOSIS — J449 Chronic obstructive pulmonary disease, unspecified: Secondary | ICD-10-CM | POA: Diagnosis not present

## 2024-07-09 DIAGNOSIS — M6281 Muscle weakness (generalized): Secondary | ICD-10-CM | POA: Diagnosis not present

## 2024-07-09 DIAGNOSIS — R41841 Cognitive communication deficit: Secondary | ICD-10-CM | POA: Diagnosis not present

## 2024-07-10 DIAGNOSIS — J449 Chronic obstructive pulmonary disease, unspecified: Secondary | ICD-10-CM | POA: Diagnosis not present

## 2024-07-10 DIAGNOSIS — L602 Onychogryphosis: Secondary | ICD-10-CM | POA: Diagnosis not present

## 2024-07-10 DIAGNOSIS — R41841 Cognitive communication deficit: Secondary | ICD-10-CM | POA: Diagnosis not present

## 2024-07-10 DIAGNOSIS — Z794 Long term (current) use of insulin: Secondary | ICD-10-CM | POA: Diagnosis not present

## 2024-07-10 DIAGNOSIS — R1312 Dysphagia, oropharyngeal phase: Secondary | ICD-10-CM | POA: Diagnosis not present

## 2024-07-10 DIAGNOSIS — M6281 Muscle weakness (generalized): Secondary | ICD-10-CM | POA: Diagnosis not present

## 2024-07-10 DIAGNOSIS — L603 Nail dystrophy: Secondary | ICD-10-CM | POA: Diagnosis not present

## 2024-07-10 DIAGNOSIS — E1151 Type 2 diabetes mellitus with diabetic peripheral angiopathy without gangrene: Secondary | ICD-10-CM | POA: Diagnosis not present

## 2024-07-11 DIAGNOSIS — J449 Chronic obstructive pulmonary disease, unspecified: Secondary | ICD-10-CM | POA: Diagnosis not present

## 2024-07-11 DIAGNOSIS — J9610 Chronic respiratory failure, unspecified whether with hypoxia or hypercapnia: Secondary | ICD-10-CM | POA: Diagnosis not present

## 2024-07-11 DIAGNOSIS — R490 Dysphonia: Secondary | ICD-10-CM | POA: Diagnosis not present

## 2024-07-11 DIAGNOSIS — R1312 Dysphagia, oropharyngeal phase: Secondary | ICD-10-CM | POA: Diagnosis not present

## 2024-07-11 DIAGNOSIS — R059 Cough, unspecified: Secondary | ICD-10-CM | POA: Diagnosis not present

## 2024-07-11 DIAGNOSIS — F0150 Vascular dementia without behavioral disturbance: Secondary | ICD-10-CM | POA: Diagnosis not present

## 2024-07-11 DIAGNOSIS — R41841 Cognitive communication deficit: Secondary | ICD-10-CM | POA: Diagnosis not present

## 2024-07-11 DIAGNOSIS — Z9981 Dependence on supplemental oxygen: Secondary | ICD-10-CM | POA: Diagnosis not present

## 2024-07-11 DIAGNOSIS — R0981 Nasal congestion: Secondary | ICD-10-CM | POA: Diagnosis not present

## 2024-07-11 DIAGNOSIS — M6281 Muscle weakness (generalized): Secondary | ICD-10-CM | POA: Diagnosis not present

## 2024-07-12 DIAGNOSIS — R1312 Dysphagia, oropharyngeal phase: Secondary | ICD-10-CM | POA: Diagnosis not present

## 2024-07-12 DIAGNOSIS — M6281 Muscle weakness (generalized): Secondary | ICD-10-CM | POA: Diagnosis not present

## 2024-07-12 DIAGNOSIS — I5022 Chronic systolic (congestive) heart failure: Secondary | ICD-10-CM | POA: Diagnosis not present

## 2024-07-12 DIAGNOSIS — Z7409 Other reduced mobility: Secondary | ICD-10-CM | POA: Diagnosis not present

## 2024-07-12 DIAGNOSIS — Z9981 Dependence on supplemental oxygen: Secondary | ICD-10-CM | POA: Diagnosis not present

## 2024-07-12 DIAGNOSIS — R41841 Cognitive communication deficit: Secondary | ICD-10-CM | POA: Diagnosis not present

## 2024-07-12 DIAGNOSIS — J449 Chronic obstructive pulmonary disease, unspecified: Secondary | ICD-10-CM | POA: Diagnosis not present

## 2024-07-13 DIAGNOSIS — F411 Generalized anxiety disorder: Secondary | ICD-10-CM | POA: Diagnosis not present

## 2024-07-13 DIAGNOSIS — E119 Type 2 diabetes mellitus without complications: Secondary | ICD-10-CM | POA: Diagnosis not present

## 2024-07-13 DIAGNOSIS — J449 Chronic obstructive pulmonary disease, unspecified: Secondary | ICD-10-CM | POA: Diagnosis not present

## 2024-07-13 DIAGNOSIS — F331 Major depressive disorder, recurrent, moderate: Secondary | ICD-10-CM | POA: Diagnosis not present

## 2024-07-13 DIAGNOSIS — M6281 Muscle weakness (generalized): Secondary | ICD-10-CM | POA: Diagnosis not present

## 2024-07-13 DIAGNOSIS — Z7409 Other reduced mobility: Secondary | ICD-10-CM | POA: Diagnosis not present

## 2024-07-13 DIAGNOSIS — I5022 Chronic systolic (congestive) heart failure: Secondary | ICD-10-CM | POA: Diagnosis not present

## 2024-07-13 DIAGNOSIS — G3184 Mild cognitive impairment, so stated: Secondary | ICD-10-CM | POA: Diagnosis not present

## 2024-07-13 DIAGNOSIS — F339 Major depressive disorder, recurrent, unspecified: Secondary | ICD-10-CM | POA: Diagnosis not present

## 2024-07-13 DIAGNOSIS — Z9981 Dependence on supplemental oxygen: Secondary | ICD-10-CM | POA: Diagnosis not present

## 2024-07-13 DIAGNOSIS — R1312 Dysphagia, oropharyngeal phase: Secondary | ICD-10-CM | POA: Diagnosis not present

## 2024-07-13 DIAGNOSIS — R41841 Cognitive communication deficit: Secondary | ICD-10-CM | POA: Diagnosis not present

## 2024-07-13 DIAGNOSIS — I10 Essential (primary) hypertension: Secondary | ICD-10-CM | POA: Diagnosis not present

## 2024-07-14 DIAGNOSIS — M6281 Muscle weakness (generalized): Secondary | ICD-10-CM | POA: Diagnosis not present

## 2024-07-14 DIAGNOSIS — R1312 Dysphagia, oropharyngeal phase: Secondary | ICD-10-CM | POA: Diagnosis not present

## 2024-07-14 DIAGNOSIS — R41841 Cognitive communication deficit: Secondary | ICD-10-CM | POA: Diagnosis not present

## 2024-07-15 DIAGNOSIS — R1312 Dysphagia, oropharyngeal phase: Secondary | ICD-10-CM | POA: Diagnosis not present

## 2024-07-15 DIAGNOSIS — M6281 Muscle weakness (generalized): Secondary | ICD-10-CM | POA: Diagnosis not present

## 2024-07-15 DIAGNOSIS — R41841 Cognitive communication deficit: Secondary | ICD-10-CM | POA: Diagnosis not present

## 2024-07-17 DIAGNOSIS — R1312 Dysphagia, oropharyngeal phase: Secondary | ICD-10-CM | POA: Diagnosis not present

## 2024-07-17 DIAGNOSIS — R41841 Cognitive communication deficit: Secondary | ICD-10-CM | POA: Diagnosis not present

## 2024-07-17 DIAGNOSIS — M6281 Muscle weakness (generalized): Secondary | ICD-10-CM | POA: Diagnosis not present

## 2024-07-19 DIAGNOSIS — R41841 Cognitive communication deficit: Secondary | ICD-10-CM | POA: Diagnosis not present

## 2024-07-19 DIAGNOSIS — M6281 Muscle weakness (generalized): Secondary | ICD-10-CM | POA: Diagnosis not present

## 2024-07-19 DIAGNOSIS — R1312 Dysphagia, oropharyngeal phase: Secondary | ICD-10-CM | POA: Diagnosis not present

## 2024-07-19 DIAGNOSIS — J449 Chronic obstructive pulmonary disease, unspecified: Secondary | ICD-10-CM | POA: Diagnosis not present

## 2024-07-20 DIAGNOSIS — M6281 Muscle weakness (generalized): Secondary | ICD-10-CM | POA: Diagnosis not present

## 2024-07-20 DIAGNOSIS — J449 Chronic obstructive pulmonary disease, unspecified: Secondary | ICD-10-CM | POA: Diagnosis not present

## 2024-07-20 DIAGNOSIS — R1312 Dysphagia, oropharyngeal phase: Secondary | ICD-10-CM | POA: Diagnosis not present

## 2024-07-20 DIAGNOSIS — R41841 Cognitive communication deficit: Secondary | ICD-10-CM | POA: Diagnosis not present

## 2024-07-21 DIAGNOSIS — R41841 Cognitive communication deficit: Secondary | ICD-10-CM | POA: Diagnosis not present

## 2024-07-21 DIAGNOSIS — R1312 Dysphagia, oropharyngeal phase: Secondary | ICD-10-CM | POA: Diagnosis not present

## 2024-07-21 DIAGNOSIS — M6281 Muscle weakness (generalized): Secondary | ICD-10-CM | POA: Diagnosis not present

## 2024-07-22 DIAGNOSIS — R1312 Dysphagia, oropharyngeal phase: Secondary | ICD-10-CM | POA: Diagnosis not present

## 2024-07-22 DIAGNOSIS — M6281 Muscle weakness (generalized): Secondary | ICD-10-CM | POA: Diagnosis not present

## 2024-07-22 DIAGNOSIS — R41841 Cognitive communication deficit: Secondary | ICD-10-CM | POA: Diagnosis not present

## 2024-07-24 DIAGNOSIS — M6281 Muscle weakness (generalized): Secondary | ICD-10-CM | POA: Diagnosis not present

## 2024-07-24 DIAGNOSIS — R41841 Cognitive communication deficit: Secondary | ICD-10-CM | POA: Diagnosis not present

## 2024-07-24 DIAGNOSIS — R1312 Dysphagia, oropharyngeal phase: Secondary | ICD-10-CM | POA: Diagnosis not present

## 2024-07-24 DIAGNOSIS — J449 Chronic obstructive pulmonary disease, unspecified: Secondary | ICD-10-CM | POA: Diagnosis not present

## 2024-07-25 DIAGNOSIS — R1312 Dysphagia, oropharyngeal phase: Secondary | ICD-10-CM | POA: Diagnosis not present

## 2024-07-25 DIAGNOSIS — Z9981 Dependence on supplemental oxygen: Secondary | ICD-10-CM | POA: Diagnosis not present

## 2024-07-25 DIAGNOSIS — R41841 Cognitive communication deficit: Secondary | ICD-10-CM | POA: Diagnosis not present

## 2024-07-25 DIAGNOSIS — Z720 Tobacco use: Secondary | ICD-10-CM | POA: Diagnosis not present

## 2024-07-25 DIAGNOSIS — Z7409 Other reduced mobility: Secondary | ICD-10-CM | POA: Diagnosis not present

## 2024-07-25 DIAGNOSIS — J449 Chronic obstructive pulmonary disease, unspecified: Secondary | ICD-10-CM | POA: Diagnosis not present

## 2024-07-25 DIAGNOSIS — M6281 Muscle weakness (generalized): Secondary | ICD-10-CM | POA: Diagnosis not present

## 2024-07-26 DIAGNOSIS — R41841 Cognitive communication deficit: Secondary | ICD-10-CM | POA: Diagnosis not present

## 2024-07-26 DIAGNOSIS — R1312 Dysphagia, oropharyngeal phase: Secondary | ICD-10-CM | POA: Diagnosis not present

## 2024-07-26 DIAGNOSIS — J449 Chronic obstructive pulmonary disease, unspecified: Secondary | ICD-10-CM | POA: Diagnosis not present

## 2024-07-26 DIAGNOSIS — M6281 Muscle weakness (generalized): Secondary | ICD-10-CM | POA: Diagnosis not present

## 2024-07-26 DIAGNOSIS — F339 Major depressive disorder, recurrent, unspecified: Secondary | ICD-10-CM | POA: Diagnosis not present

## 2024-07-26 DIAGNOSIS — I10 Essential (primary) hypertension: Secondary | ICD-10-CM | POA: Diagnosis not present

## 2024-07-26 DIAGNOSIS — E119 Type 2 diabetes mellitus without complications: Secondary | ICD-10-CM | POA: Diagnosis not present

## 2024-07-27 DIAGNOSIS — F331 Major depressive disorder, recurrent, moderate: Secondary | ICD-10-CM | POA: Diagnosis not present

## 2024-07-27 DIAGNOSIS — Z7689 Persons encountering health services in other specified circumstances: Secondary | ICD-10-CM | POA: Diagnosis not present

## 2024-07-27 DIAGNOSIS — F411 Generalized anxiety disorder: Secondary | ICD-10-CM | POA: Diagnosis not present

## 2024-07-27 DIAGNOSIS — I739 Peripheral vascular disease, unspecified: Secondary | ICD-10-CM | POA: Diagnosis not present

## 2024-07-27 DIAGNOSIS — G3184 Mild cognitive impairment, so stated: Secondary | ICD-10-CM | POA: Diagnosis not present

## 2024-07-27 DIAGNOSIS — Z7289 Other problems related to lifestyle: Secondary | ICD-10-CM | POA: Diagnosis not present

## 2024-07-27 DIAGNOSIS — Z4781 Encounter for orthopedic aftercare following surgical amputation: Secondary | ICD-10-CM | POA: Diagnosis not present

## 2024-07-28 DIAGNOSIS — Z9981 Dependence on supplemental oxygen: Secondary | ICD-10-CM | POA: Diagnosis not present

## 2024-07-28 DIAGNOSIS — R1312 Dysphagia, oropharyngeal phase: Secondary | ICD-10-CM | POA: Diagnosis not present

## 2024-07-28 DIAGNOSIS — J189 Pneumonia, unspecified organism: Secondary | ICD-10-CM | POA: Diagnosis not present

## 2024-07-28 DIAGNOSIS — M6281 Muscle weakness (generalized): Secondary | ICD-10-CM | POA: Diagnosis not present

## 2024-07-28 DIAGNOSIS — Z7409 Other reduced mobility: Secondary | ICD-10-CM | POA: Diagnosis not present

## 2024-07-28 DIAGNOSIS — J441 Chronic obstructive pulmonary disease with (acute) exacerbation: Secondary | ICD-10-CM | POA: Diagnosis not present

## 2024-07-28 DIAGNOSIS — R531 Weakness: Secondary | ICD-10-CM | POA: Diagnosis not present

## 2024-07-28 DIAGNOSIS — R41841 Cognitive communication deficit: Secondary | ICD-10-CM | POA: Diagnosis not present

## 2024-07-28 DIAGNOSIS — J9610 Chronic respiratory failure, unspecified whether with hypoxia or hypercapnia: Secondary | ICD-10-CM | POA: Diagnosis not present

## 2024-07-29 DIAGNOSIS — J441 Chronic obstructive pulmonary disease with (acute) exacerbation: Secondary | ICD-10-CM | POA: Diagnosis not present

## 2024-07-29 DIAGNOSIS — Z7409 Other reduced mobility: Secondary | ICD-10-CM | POA: Diagnosis not present

## 2024-07-29 DIAGNOSIS — M6281 Muscle weakness (generalized): Secondary | ICD-10-CM | POA: Diagnosis not present

## 2024-07-29 DIAGNOSIS — R1312 Dysphagia, oropharyngeal phase: Secondary | ICD-10-CM | POA: Diagnosis not present

## 2024-07-29 DIAGNOSIS — R059 Cough, unspecified: Secondary | ICD-10-CM | POA: Diagnosis not present

## 2024-07-29 DIAGNOSIS — R531 Weakness: Secondary | ICD-10-CM | POA: Diagnosis not present

## 2024-07-29 DIAGNOSIS — Z9981 Dependence on supplemental oxygen: Secondary | ICD-10-CM | POA: Diagnosis not present

## 2024-07-29 DIAGNOSIS — R41841 Cognitive communication deficit: Secondary | ICD-10-CM | POA: Diagnosis not present

## 2024-07-29 DIAGNOSIS — J189 Pneumonia, unspecified organism: Secondary | ICD-10-CM | POA: Diagnosis not present

## 2024-07-29 DIAGNOSIS — Z91148 Patient's other noncompliance with medication regimen for other reason: Secondary | ICD-10-CM | POA: Diagnosis not present

## 2024-07-29 DIAGNOSIS — J9610 Chronic respiratory failure, unspecified whether with hypoxia or hypercapnia: Secondary | ICD-10-CM | POA: Diagnosis not present

## 2024-07-29 DIAGNOSIS — R0600 Dyspnea, unspecified: Secondary | ICD-10-CM | POA: Diagnosis not present

## 2024-07-30 DIAGNOSIS — J9610 Chronic respiratory failure, unspecified whether with hypoxia or hypercapnia: Secondary | ICD-10-CM | POA: Diagnosis not present

## 2024-07-30 DIAGNOSIS — Z91148 Patient's other noncompliance with medication regimen for other reason: Secondary | ICD-10-CM | POA: Diagnosis not present

## 2024-07-30 DIAGNOSIS — R059 Cough, unspecified: Secondary | ICD-10-CM | POA: Diagnosis not present

## 2024-07-30 DIAGNOSIS — J189 Pneumonia, unspecified organism: Secondary | ICD-10-CM | POA: Diagnosis not present

## 2024-07-30 DIAGNOSIS — R531 Weakness: Secondary | ICD-10-CM | POA: Diagnosis not present

## 2024-07-30 DIAGNOSIS — Z9981 Dependence on supplemental oxygen: Secondary | ICD-10-CM | POA: Diagnosis not present

## 2024-07-30 DIAGNOSIS — J441 Chronic obstructive pulmonary disease with (acute) exacerbation: Secondary | ICD-10-CM | POA: Diagnosis not present

## 2024-07-30 DIAGNOSIS — Z7409 Other reduced mobility: Secondary | ICD-10-CM | POA: Diagnosis not present

## 2024-07-31 ENCOUNTER — Ambulatory Visit

## 2024-07-31 ENCOUNTER — Ambulatory Visit (HOSPITAL_COMMUNITY)

## 2024-07-31 DIAGNOSIS — M6281 Muscle weakness (generalized): Secondary | ICD-10-CM | POA: Diagnosis not present

## 2024-07-31 DIAGNOSIS — J449 Chronic obstructive pulmonary disease, unspecified: Secondary | ICD-10-CM | POA: Diagnosis not present

## 2024-07-31 DIAGNOSIS — R1312 Dysphagia, oropharyngeal phase: Secondary | ICD-10-CM | POA: Diagnosis not present

## 2024-07-31 DIAGNOSIS — R41841 Cognitive communication deficit: Secondary | ICD-10-CM | POA: Diagnosis not present

## 2024-08-01 DIAGNOSIS — R41841 Cognitive communication deficit: Secondary | ICD-10-CM | POA: Diagnosis not present

## 2024-08-01 DIAGNOSIS — J441 Chronic obstructive pulmonary disease with (acute) exacerbation: Secondary | ICD-10-CM | POA: Diagnosis not present

## 2024-08-01 DIAGNOSIS — J449 Chronic obstructive pulmonary disease, unspecified: Secondary | ICD-10-CM | POA: Diagnosis not present

## 2024-08-01 DIAGNOSIS — J9610 Chronic respiratory failure, unspecified whether with hypoxia or hypercapnia: Secondary | ICD-10-CM | POA: Diagnosis not present

## 2024-08-01 DIAGNOSIS — M6281 Muscle weakness (generalized): Secondary | ICD-10-CM | POA: Diagnosis not present

## 2024-08-01 DIAGNOSIS — R1312 Dysphagia, oropharyngeal phase: Secondary | ICD-10-CM | POA: Diagnosis not present

## 2024-08-01 DIAGNOSIS — Z9981 Dependence on supplemental oxygen: Secondary | ICD-10-CM | POA: Diagnosis not present

## 2024-08-01 DIAGNOSIS — J189 Pneumonia, unspecified organism: Secondary | ICD-10-CM | POA: Diagnosis not present

## 2024-08-02 DIAGNOSIS — R1312 Dysphagia, oropharyngeal phase: Secondary | ICD-10-CM | POA: Diagnosis not present

## 2024-08-02 DIAGNOSIS — R41841 Cognitive communication deficit: Secondary | ICD-10-CM | POA: Diagnosis not present

## 2024-08-02 DIAGNOSIS — M6281 Muscle weakness (generalized): Secondary | ICD-10-CM | POA: Diagnosis not present

## 2024-08-03 DIAGNOSIS — R41841 Cognitive communication deficit: Secondary | ICD-10-CM | POA: Diagnosis not present

## 2024-08-03 DIAGNOSIS — G3184 Mild cognitive impairment, so stated: Secondary | ICD-10-CM | POA: Diagnosis not present

## 2024-08-03 DIAGNOSIS — R1312 Dysphagia, oropharyngeal phase: Secondary | ICD-10-CM | POA: Diagnosis not present

## 2024-08-03 DIAGNOSIS — F331 Major depressive disorder, recurrent, moderate: Secondary | ICD-10-CM | POA: Diagnosis not present

## 2024-08-03 DIAGNOSIS — Z79899 Other long term (current) drug therapy: Secondary | ICD-10-CM | POA: Diagnosis not present

## 2024-08-03 DIAGNOSIS — Z789 Other specified health status: Secondary | ICD-10-CM | POA: Diagnosis not present

## 2024-08-03 DIAGNOSIS — F411 Generalized anxiety disorder: Secondary | ICD-10-CM | POA: Diagnosis not present

## 2024-08-03 DIAGNOSIS — M6281 Muscle weakness (generalized): Secondary | ICD-10-CM | POA: Diagnosis not present

## 2024-08-04 DIAGNOSIS — J441 Chronic obstructive pulmonary disease with (acute) exacerbation: Secondary | ICD-10-CM | POA: Diagnosis not present

## 2024-08-04 DIAGNOSIS — R41841 Cognitive communication deficit: Secondary | ICD-10-CM | POA: Diagnosis not present

## 2024-08-04 DIAGNOSIS — Z7409 Other reduced mobility: Secondary | ICD-10-CM | POA: Diagnosis not present

## 2024-08-04 DIAGNOSIS — R531 Weakness: Secondary | ICD-10-CM | POA: Diagnosis not present

## 2024-08-04 DIAGNOSIS — M6281 Muscle weakness (generalized): Secondary | ICD-10-CM | POA: Diagnosis not present

## 2024-08-04 DIAGNOSIS — R1312 Dysphagia, oropharyngeal phase: Secondary | ICD-10-CM | POA: Diagnosis not present

## 2024-08-04 DIAGNOSIS — J449 Chronic obstructive pulmonary disease, unspecified: Secondary | ICD-10-CM | POA: Diagnosis not present

## 2024-08-04 DIAGNOSIS — Z9981 Dependence on supplemental oxygen: Secondary | ICD-10-CM | POA: Diagnosis not present

## 2024-08-04 DIAGNOSIS — J9610 Chronic respiratory failure, unspecified whether with hypoxia or hypercapnia: Secondary | ICD-10-CM | POA: Diagnosis not present

## 2024-08-04 DIAGNOSIS — J189 Pneumonia, unspecified organism: Secondary | ICD-10-CM | POA: Diagnosis not present

## 2024-08-06 DIAGNOSIS — J9610 Chronic respiratory failure, unspecified whether with hypoxia or hypercapnia: Secondary | ICD-10-CM | POA: Diagnosis not present

## 2024-08-06 DIAGNOSIS — Z7409 Other reduced mobility: Secondary | ICD-10-CM | POA: Diagnosis not present

## 2024-08-06 DIAGNOSIS — R531 Weakness: Secondary | ICD-10-CM | POA: Diagnosis not present

## 2024-08-06 DIAGNOSIS — J189 Pneumonia, unspecified organism: Secondary | ICD-10-CM | POA: Diagnosis not present

## 2024-08-06 DIAGNOSIS — Z9981 Dependence on supplemental oxygen: Secondary | ICD-10-CM | POA: Diagnosis not present

## 2024-08-06 DIAGNOSIS — J441 Chronic obstructive pulmonary disease with (acute) exacerbation: Secondary | ICD-10-CM | POA: Diagnosis not present

## 2024-08-07 DIAGNOSIS — J449 Chronic obstructive pulmonary disease, unspecified: Secondary | ICD-10-CM | POA: Diagnosis not present

## 2024-08-07 DIAGNOSIS — R41841 Cognitive communication deficit: Secondary | ICD-10-CM | POA: Diagnosis not present

## 2024-08-07 DIAGNOSIS — R1312 Dysphagia, oropharyngeal phase: Secondary | ICD-10-CM | POA: Diagnosis not present

## 2024-08-07 DIAGNOSIS — M6281 Muscle weakness (generalized): Secondary | ICD-10-CM | POA: Diagnosis not present

## 2024-08-08 DIAGNOSIS — E119 Type 2 diabetes mellitus without complications: Secondary | ICD-10-CM | POA: Diagnosis not present

## 2024-08-08 DIAGNOSIS — R531 Weakness: Secondary | ICD-10-CM | POA: Diagnosis not present

## 2024-08-08 DIAGNOSIS — J449 Chronic obstructive pulmonary disease, unspecified: Secondary | ICD-10-CM | POA: Diagnosis not present

## 2024-08-08 DIAGNOSIS — R1312 Dysphagia, oropharyngeal phase: Secondary | ICD-10-CM | POA: Diagnosis not present

## 2024-08-08 DIAGNOSIS — Z9981 Dependence on supplemental oxygen: Secondary | ICD-10-CM | POA: Diagnosis not present

## 2024-08-08 DIAGNOSIS — I130 Hypertensive heart and chronic kidney disease with heart failure and stage 1 through stage 4 chronic kidney disease, or unspecified chronic kidney disease: Secondary | ICD-10-CM | POA: Diagnosis not present

## 2024-08-08 DIAGNOSIS — M6281 Muscle weakness (generalized): Secondary | ICD-10-CM | POA: Diagnosis not present

## 2024-08-08 DIAGNOSIS — R41841 Cognitive communication deficit: Secondary | ICD-10-CM | POA: Diagnosis not present

## 2024-08-08 DIAGNOSIS — J189 Pneumonia, unspecified organism: Secondary | ICD-10-CM | POA: Diagnosis not present

## 2024-08-09 DIAGNOSIS — R41841 Cognitive communication deficit: Secondary | ICD-10-CM | POA: Diagnosis not present

## 2024-08-09 DIAGNOSIS — M6281 Muscle weakness (generalized): Secondary | ICD-10-CM | POA: Diagnosis not present

## 2024-08-09 DIAGNOSIS — R1312 Dysphagia, oropharyngeal phase: Secondary | ICD-10-CM | POA: Diagnosis not present

## 2024-08-09 DIAGNOSIS — J449 Chronic obstructive pulmonary disease, unspecified: Secondary | ICD-10-CM | POA: Diagnosis not present

## 2024-08-10 DIAGNOSIS — F331 Major depressive disorder, recurrent, moderate: Secondary | ICD-10-CM | POA: Diagnosis not present

## 2024-08-10 DIAGNOSIS — G8929 Other chronic pain: Secondary | ICD-10-CM | POA: Diagnosis not present

## 2024-08-10 DIAGNOSIS — M6281 Muscle weakness (generalized): Secondary | ICD-10-CM | POA: Diagnosis not present

## 2024-08-10 DIAGNOSIS — G3184 Mild cognitive impairment, so stated: Secondary | ICD-10-CM | POA: Diagnosis not present

## 2024-08-10 DIAGNOSIS — R41841 Cognitive communication deficit: Secondary | ICD-10-CM | POA: Diagnosis not present

## 2024-08-10 DIAGNOSIS — F411 Generalized anxiety disorder: Secondary | ICD-10-CM | POA: Diagnosis not present

## 2024-08-10 DIAGNOSIS — R1312 Dysphagia, oropharyngeal phase: Secondary | ICD-10-CM | POA: Diagnosis not present

## 2024-08-11 DIAGNOSIS — M6281 Muscle weakness (generalized): Secondary | ICD-10-CM | POA: Diagnosis not present

## 2024-08-11 DIAGNOSIS — R1312 Dysphagia, oropharyngeal phase: Secondary | ICD-10-CM | POA: Diagnosis not present

## 2024-08-11 DIAGNOSIS — R41841 Cognitive communication deficit: Secondary | ICD-10-CM | POA: Diagnosis not present

## 2024-08-12 DIAGNOSIS — Z872 Personal history of diseases of the skin and subcutaneous tissue: Secondary | ICD-10-CM | POA: Diagnosis not present

## 2024-08-12 DIAGNOSIS — Z9981 Dependence on supplemental oxygen: Secondary | ICD-10-CM | POA: Diagnosis not present

## 2024-08-12 DIAGNOSIS — J189 Pneumonia, unspecified organism: Secondary | ICD-10-CM | POA: Diagnosis not present

## 2024-08-24 DIAGNOSIS — Z7409 Other reduced mobility: Secondary | ICD-10-CM | POA: Diagnosis not present

## 2024-08-24 DIAGNOSIS — F411 Generalized anxiety disorder: Secondary | ICD-10-CM | POA: Diagnosis not present

## 2024-08-24 DIAGNOSIS — Z9981 Dependence on supplemental oxygen: Secondary | ICD-10-CM | POA: Diagnosis not present

## 2024-08-24 DIAGNOSIS — F17211 Nicotine dependence, cigarettes, in remission: Secondary | ICD-10-CM | POA: Diagnosis not present

## 2024-08-24 DIAGNOSIS — F331 Major depressive disorder, recurrent, moderate: Secondary | ICD-10-CM | POA: Diagnosis not present

## 2024-08-24 DIAGNOSIS — R531 Weakness: Secondary | ICD-10-CM | POA: Diagnosis not present

## 2024-08-24 DIAGNOSIS — J449 Chronic obstructive pulmonary disease, unspecified: Secondary | ICD-10-CM | POA: Diagnosis not present

## 2024-08-24 DIAGNOSIS — G3184 Mild cognitive impairment, so stated: Secondary | ICD-10-CM | POA: Diagnosis not present

## 2024-09-05 DIAGNOSIS — Z7409 Other reduced mobility: Secondary | ICD-10-CM | POA: Diagnosis not present

## 2024-09-05 DIAGNOSIS — Z91148 Patient's other noncompliance with medication regimen for other reason: Secondary | ICD-10-CM | POA: Diagnosis not present

## 2024-09-05 DIAGNOSIS — Z9981 Dependence on supplemental oxygen: Secondary | ICD-10-CM | POA: Diagnosis not present

## 2024-09-05 DIAGNOSIS — R531 Weakness: Secondary | ICD-10-CM | POA: Diagnosis not present

## 2024-09-10 DIAGNOSIS — R531 Weakness: Secondary | ICD-10-CM | POA: Diagnosis not present

## 2024-09-10 DIAGNOSIS — Z7409 Other reduced mobility: Secondary | ICD-10-CM | POA: Diagnosis not present

## 2024-09-10 DIAGNOSIS — F0150 Vascular dementia without behavioral disturbance: Secondary | ICD-10-CM | POA: Diagnosis not present

## 2024-09-10 DIAGNOSIS — Z91148 Patient's other noncompliance with medication regimen for other reason: Secondary | ICD-10-CM | POA: Diagnosis not present

## 2024-09-10 DIAGNOSIS — I130 Hypertensive heart and chronic kidney disease with heart failure and stage 1 through stage 4 chronic kidney disease, or unspecified chronic kidney disease: Secondary | ICD-10-CM | POA: Diagnosis not present

## 2024-09-10 DIAGNOSIS — I5022 Chronic systolic (congestive) heart failure: Secondary | ICD-10-CM | POA: Diagnosis not present
